# Patient Record
Sex: Male | Born: 1945
Health system: Southern US, Community
[De-identification: ages and names within clinical notes are randomized; demographics above are authoritative.]

## PROBLEM LIST (undated history)

## (undated) DIAGNOSIS — I2510 Atherosclerotic heart disease of native coronary artery without angina pectoris: Secondary | ICD-10-CM

## (undated) DIAGNOSIS — I10 Essential (primary) hypertension: Secondary | ICD-10-CM

## (undated) DIAGNOSIS — K219 Gastro-esophageal reflux disease without esophagitis: Secondary | ICD-10-CM

## (undated) DIAGNOSIS — E669 Obesity, unspecified: Secondary | ICD-10-CM

## (undated) DIAGNOSIS — R569 Unspecified convulsions: Secondary | ICD-10-CM

## (undated) DIAGNOSIS — M069 Rheumatoid arthritis, unspecified: Secondary | ICD-10-CM

## (undated) DIAGNOSIS — C8590 Non-Hodgkin lymphoma, unspecified, unspecified site: Secondary | ICD-10-CM

## (undated) DIAGNOSIS — I480 Paroxysmal atrial fibrillation: Secondary | ICD-10-CM

## (undated) DIAGNOSIS — IMO0002 Reserved for concepts with insufficient information to code with codable children: Secondary | ICD-10-CM

## (undated) DIAGNOSIS — I4510 Unspecified right bundle-branch block: Secondary | ICD-10-CM

## (undated) DIAGNOSIS — IMO0001 Reserved for inherently not codable concepts without codable children: Secondary | ICD-10-CM

## (undated) DIAGNOSIS — M1990 Unspecified osteoarthritis, unspecified site: Secondary | ICD-10-CM

## (undated) DIAGNOSIS — G250 Essential tremor: Secondary | ICD-10-CM

## (undated) DIAGNOSIS — G4730 Sleep apnea, unspecified: Secondary | ICD-10-CM

## (undated) DIAGNOSIS — D649 Anemia, unspecified: Secondary | ICD-10-CM

## (undated) DIAGNOSIS — H9190 Unspecified hearing loss, unspecified ear: Secondary | ICD-10-CM

## (undated) DIAGNOSIS — G4733 Obstructive sleep apnea (adult) (pediatric): Secondary | ICD-10-CM

## (undated) DIAGNOSIS — E119 Type 2 diabetes mellitus without complications: Secondary | ICD-10-CM

## (undated) DIAGNOSIS — N400 Enlarged prostate without lower urinary tract symptoms: Secondary | ICD-10-CM

## (undated) DIAGNOSIS — I639 Cerebral infarction, unspecified: Secondary | ICD-10-CM

## (undated) DIAGNOSIS — C801 Malignant (primary) neoplasm, unspecified: Secondary | ICD-10-CM

## (undated) DIAGNOSIS — Z531 Procedure and treatment not carried out because of patient's decision for reasons of belief and group pressure: Secondary | ICD-10-CM

## (undated) HISTORY — PX: EYE SURGERY: SHX253

## (undated) HISTORY — DX: Unspecified osteoarthritis, unspecified site: M19.90

## (undated) HISTORY — DX: Benign prostatic hyperplasia without lower urinary tract symptoms: N40.0

## (undated) HISTORY — PX: COLONOSCOPY: SHX174

## (undated) HISTORY — PX: VASECTOMY: SHX75

## (undated) HISTORY — DX: Type 2 diabetes mellitus without complications: E11.9

## (undated) HISTORY — PX: TONSILLECTOMY: SUR1361

## (undated) HISTORY — DX: Unspecified convulsions: R56.9

## (undated) HISTORY — DX: Obesity, unspecified: E66.9

## (undated) HISTORY — PX: WRIST GANGLION EXCISION: SHX840

## (undated) HISTORY — PX: PROSTATE BIOPSY: SHX241

## (undated) HISTORY — DX: Malignant (primary) neoplasm, unspecified: C80.1

## (undated) HISTORY — PX: SPINE SURGERY: SHX786

## (undated) HISTORY — DX: Rheumatoid arthritis, unspecified: M06.9

## (undated) HISTORY — DX: Essential (primary) hypertension: I10

## (undated) HISTORY — DX: Reserved for concepts with insufficient information to code with codable children: IMO0002

## (undated) HISTORY — DX: Obstructive sleep apnea (adult) (pediatric): G47.33

---

## 1999-03-03 ENCOUNTER — Emergency Department (HOSPITAL_COMMUNITY): Admission: EM | Admit: 1999-03-03 | Discharge: 1999-03-03 | Payer: Self-pay | Admitting: Emergency Medicine

## 1999-03-04 ENCOUNTER — Emergency Department (HOSPITAL_COMMUNITY): Admission: EM | Admit: 1999-03-04 | Discharge: 1999-03-05 | Payer: Self-pay | Admitting: Emergency Medicine

## 1999-08-31 ENCOUNTER — Emergency Department (HOSPITAL_COMMUNITY): Admission: EM | Admit: 1999-08-31 | Discharge: 1999-08-31 | Payer: Self-pay | Admitting: Emergency Medicine

## 2000-01-11 ENCOUNTER — Ambulatory Visit (HOSPITAL_COMMUNITY): Admission: RE | Admit: 2000-01-11 | Discharge: 2000-01-11 | Payer: Self-pay | Admitting: *Deleted

## 2000-03-02 ENCOUNTER — Emergency Department (HOSPITAL_COMMUNITY): Admission: EM | Admit: 2000-03-02 | Discharge: 2000-03-02 | Payer: Self-pay | Admitting: *Deleted

## 2003-11-11 ENCOUNTER — Encounter: Admission: RE | Admit: 2003-11-11 | Discharge: 2003-11-11 | Payer: Self-pay | Admitting: Emergency Medicine

## 2004-07-25 ENCOUNTER — Encounter: Admission: RE | Admit: 2004-07-25 | Discharge: 2004-07-25 | Payer: Self-pay | Admitting: Emergency Medicine

## 2005-11-22 ENCOUNTER — Ambulatory Visit: Payer: Self-pay | Admitting: Gastroenterology

## 2005-12-06 ENCOUNTER — Ambulatory Visit: Payer: Self-pay | Admitting: Gastroenterology

## 2007-12-04 ENCOUNTER — Encounter: Admission: RE | Admit: 2007-12-04 | Discharge: 2007-12-04 | Payer: Self-pay | Admitting: Emergency Medicine

## 2007-12-08 ENCOUNTER — Encounter: Admission: RE | Admit: 2007-12-08 | Discharge: 2007-12-08 | Payer: Self-pay | Admitting: Emergency Medicine

## 2010-12-03 ENCOUNTER — Encounter: Payer: Self-pay | Admitting: Emergency Medicine

## 2011-02-14 ENCOUNTER — Emergency Department (HOSPITAL_COMMUNITY): Payer: Medicare Other

## 2011-02-14 ENCOUNTER — Emergency Department (HOSPITAL_COMMUNITY)
Admission: EM | Admit: 2011-02-14 | Discharge: 2011-02-14 | Disposition: A | Discharge status: Home / Self Care | Payer: Medicare Other | Attending: Emergency Medicine | Admitting: Emergency Medicine

## 2011-02-14 DIAGNOSIS — R0789 Other chest pain: Secondary | ICD-10-CM | POA: Insufficient documentation

## 2011-02-14 DIAGNOSIS — R112 Nausea with vomiting, unspecified: Secondary | ICD-10-CM | POA: Insufficient documentation

## 2011-02-14 DIAGNOSIS — R262 Difficulty in walking, not elsewhere classified: Secondary | ICD-10-CM | POA: Insufficient documentation

## 2011-02-14 DIAGNOSIS — R0602 Shortness of breath: Secondary | ICD-10-CM | POA: Insufficient documentation

## 2011-02-14 DIAGNOSIS — H81399 Other peripheral vertigo, unspecified ear: Secondary | ICD-10-CM | POA: Insufficient documentation

## 2011-02-14 DIAGNOSIS — I10 Essential (primary) hypertension: Secondary | ICD-10-CM | POA: Insufficient documentation

## 2011-02-14 LAB — CBC
HCT: 44.8 % (ref 39.0–52.0)
Hemoglobin: 14.8 g/dL (ref 13.0–17.0)
MCH: 30.1 pg (ref 26.0–34.0)
MCHC: 33 g/dL (ref 30.0–36.0)
MCV: 91.2 fL (ref 78.0–100.0)
Platelets: 214 10*3/uL (ref 150–400)
RBC: 4.91 MIL/uL (ref 4.22–5.81)
RDW: 13.5 % (ref 11.5–15.5)
WBC: 7.1 10*3/uL (ref 4.0–10.5)

## 2011-02-14 LAB — BASIC METABOLIC PANEL
BUN: 13 mg/dL (ref 6–23)
CO2: 27 mEq/L (ref 19–32)
Calcium: 9.1 mg/dL (ref 8.4–10.5)
Chloride: 99 mEq/L (ref 96–112)
Creatinine, Ser: 0.98 mg/dL (ref 0.4–1.5)
GFR calc Af Amer: >60 mL/min (ref >60)
GFR calc non Af Amer: >60 mL/min (ref >60)
Glucose, Bld: 110 mg/dL — ABNORMAL HIGH (ref 70–99)
Potassium: 3.9 mEq/L (ref 3.5–5.1)
Sodium: 134 mEq/L — ABNORMAL LOW (ref 135–145)

## 2011-02-14 LAB — DIFFERENTIAL
Basophils Absolute: 0 10*3/uL (ref 0.0–0.1)
Basophils Relative: 0 % (ref 0–1)
Eosinophils Absolute: 0 10*3/uL (ref 0.0–0.7)
Eosinophils Relative: 1 % (ref 0–5)
Lymphocytes Relative: 13 % (ref 12–46)
Lymphs Abs: 0.9 10*3/uL (ref 0.7–4.0)
Monocytes Absolute: 0.3 10*3/uL (ref 0.1–1.0)
Monocytes Relative: 4 % (ref 3–12)
Neutro Abs: 5.9 10*3/uL (ref 1.7–7.7)
Neutrophils Relative %: 83 % — ABNORMAL HIGH (ref 43–77)

## 2011-02-14 LAB — POCT CARDIAC MARKERS
CKMB, poc: 2 ng/mL (ref 1.0–8.0)
Myoglobin, poc: 168 ng/mL (ref 12–200)
Troponin i, poc: <0.05 ng/mL (ref 0.00–0.09)

## 2011-10-22 ENCOUNTER — Ambulatory Visit (INDEPENDENT_AMBULATORY_CARE_PROVIDER_SITE_OTHER): Payer: Medicare Other

## 2011-10-22 DIAGNOSIS — J111 Influenza due to unidentified influenza virus with other respiratory manifestations: Secondary | ICD-10-CM

## 2011-10-22 DIAGNOSIS — J9801 Acute bronchospasm: Secondary | ICD-10-CM

## 2011-10-24 ENCOUNTER — Ambulatory Visit (INDEPENDENT_AMBULATORY_CARE_PROVIDER_SITE_OTHER): Payer: Medicare Other

## 2011-10-24 DIAGNOSIS — I7300 Raynaud's syndrome without gangrene: Secondary | ICD-10-CM

## 2011-10-24 DIAGNOSIS — J111 Influenza due to unidentified influenza virus with other respiratory manifestations: Secondary | ICD-10-CM

## 2011-10-24 DIAGNOSIS — R509 Fever, unspecified: Secondary | ICD-10-CM

## 2011-12-13 ENCOUNTER — Other Ambulatory Visit: Payer: Self-pay | Admitting: Family Medicine

## 2011-12-15 ENCOUNTER — Ambulatory Visit (INDEPENDENT_AMBULATORY_CARE_PROVIDER_SITE_OTHER): Payer: Medicare Other | Admitting: Family Medicine

## 2011-12-15 VITALS — BP 149/83 | HR 85 | Temp 98.1°F | Resp 16 | Ht 64.25 in | Wt 233.2 lb

## 2011-12-15 DIAGNOSIS — G252 Other specified forms of tremor: Secondary | ICD-10-CM

## 2011-12-15 DIAGNOSIS — J069 Acute upper respiratory infection, unspecified: Secondary | ICD-10-CM

## 2011-12-15 DIAGNOSIS — J45909 Unspecified asthma, uncomplicated: Secondary | ICD-10-CM | POA: Insufficient documentation

## 2011-12-15 DIAGNOSIS — I10 Essential (primary) hypertension: Secondary | ICD-10-CM

## 2011-12-15 DIAGNOSIS — N400 Enlarged prostate without lower urinary tract symptoms: Secondary | ICD-10-CM

## 2011-12-15 DIAGNOSIS — G250 Essential tremor: Secondary | ICD-10-CM

## 2011-12-15 MED ORDER — PREDNISONE 20 MG PO TABS: ORAL_TABLET | ORAL | Status: DC

## 2011-12-15 MED ORDER — DOXYCYCLINE HYCLATE 100 MG PO TABS: 100.0000 mg | ORAL_TABLET | Freq: Two times a day (BID) | ORAL | Status: AC

## 2011-12-15 NOTE — Progress Notes (Signed)
  Subjective:    Patient ID: Bryan [name], male    DOB: 1946/05/13, 66 y.o.   MRN: [id number]  HPI Patient was here back in December and was referred to a rheumatologist because of his Raynaud's problems. However due to a glitch of not getting a phone message he never got the appointment. He continues to have issues there and will still need to see a rheumatologist.  In December patient was treated for a respiratory tract infection. He was given doxycycline, and had 4 or 5 pills left over. He has had a cold and congestion all week, and tried taking the remaining [place]. He thought he was trying to do better but ran out of the medication. He called in here to get a refill but never got a return phone call. He has persisted in having a headache. He is occasionally getting some mucus out of his nose. His chest remains congested. He coughs but does not regularly bring up anything. At times he brings up a little. Has had some fever.  He does have a primary inhaler at home but did not feel like it was helping a lot. 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 He does not smoke.   Review of Systems as above     Objective:   Physical Exam   Overweight male in no acute distress. Has hearing aids in both ears, but the TMs are normal. Throat clear. On chest exam he does have scattered wheezing throughout both lungs. Heart regular.     Assessment & Plan:  URI with asthmatic bronchitis.   Will have him to resume using his inhaler at home. We'll re\re prescribe the doxycycline since that was helping him. He is not diabetic and I'm going to give him a brief taper of prednisone to see if we can break the wheeze. No labs or x-rays were done at this time but will have him return if he is not doing better. At that time we would probably need to do additional labs and x-rays.  We will try to resubmit the previous rheumatologic referral. Since it was done on the paper chart,  I think I will just add a note to it and have them fax it back to the dermatologist office. He will give me the phone number that he wants to be contacted at

## 2011-12-15 NOTE — Patient Instructions (Addendum)
Patient was instructed to use the inhaler Rennis Golden) that he has at home. He is to take the doxycycline for the full 10 days inhaler should be used 2 puffs every 4-6 hours when awake. If he awakens in the night real congested he can use this use it then also. He is encouraged to drink lots of water.  We will send his referral on to the rheumatologist. He is to let us know if he does not hear from them

## 2011-12-18 ENCOUNTER — Other Ambulatory Visit: Payer: Self-pay | Admitting: *Deleted

## 2011-12-18 DIAGNOSIS — J45909 Unspecified asthma, uncomplicated: Secondary | ICD-10-CM

## 2012-01-29 ENCOUNTER — Ambulatory Visit (INDEPENDENT_AMBULATORY_CARE_PROVIDER_SITE_OTHER): Payer: Medicare Other | Admitting: Family Medicine

## 2012-01-29 VITALS — BP 148/88 | HR 80 | Temp 98.3°F | Resp 16 | Ht 64.25 in | Wt 229.0 lb

## 2012-01-29 DIAGNOSIS — M549 Dorsalgia, unspecified: Secondary | ICD-10-CM

## 2012-01-29 DIAGNOSIS — J410 Simple chronic bronchitis: Secondary | ICD-10-CM

## 2012-01-29 DIAGNOSIS — R350 Frequency of micturition: Secondary | ICD-10-CM

## 2012-01-29 DIAGNOSIS — I7300 Raynaud's syndrome without gangrene: Secondary | ICD-10-CM

## 2012-01-29 DIAGNOSIS — R062 Wheezing: Secondary | ICD-10-CM

## 2012-01-29 LAB — POCT UA - MICROSCOPIC ONLY
Casts, Ur, LPF, POC: NEGATIVE
Crystals, Ur, HPF, POC: NEGATIVE
Mucus, UA: NEGATIVE
Yeast, UA: NEGATIVE

## 2012-01-29 LAB — POCT URINALYSIS DIPSTICK
Bilirubin, UA: NEGATIVE
Glucose, UA: NEGATIVE
Ketones, UA: NEGATIVE
Leukocytes, UA: NEGATIVE
Nitrite, UA: NEGATIVE
Protein, UA: NEGATIVE
Spec Grav, UA: 1.015
Urobilinogen, UA: 0.2
pH, UA: 5

## 2012-01-29 MED ORDER — ALBUTEROL SULFATE (2.5 MG/3ML) 0.083% IN NEBU
2.5000 mg | INHALATION_SOLUTION | Freq: Once | RESPIRATORY_TRACT | Status: AC
  Administered 2012-01-29: 2.5 mg via RESPIRATORY_TRACT

## 2012-01-29 MED ORDER — DOXYCYCLINE HYCLATE 100 MG PO CAPS: 100.0000 mg | ORAL_CAPSULE | Freq: Two times a day (BID) | ORAL | Status: AC

## 2012-01-29 NOTE — Progress Notes (Signed)
Patient Name: Bryan [name] Date of Birth: Nov 15, 1945 Medical Record Number: [id number] Gender: male Date of Encounter: 01/29/2012  History of Present Illness:  Bryan [name] is a 66 y.o. very pleasant male patient who presents with the following:  He has Raynaud's phenomenen- was evaluated for this in December 2012 and noted to have an elevated sed rate and a positive ANA (1:320).  He was sent to one rheumatologist but did not feel that he was hearing his regarding his problem.  He would like to have a second opinion.  We did change his BP medication from HCTZ to HCTZ/ losartan but he does not note much of a difference.  His symptoms are better now that the weather is warmer.    He is here today due to a "gurgling noise in my chest" for the last 3 days or so.  He also notes some wheezing, this is worse at night. He does have an albuterol inhaler but has not been using it. His lower back hurts if he stands up.(he connects the back pain to "my prostate being enlarged.")  No cough, is not bringing up significant mucus.  No runny nose, stuffy nose or sneezing.  He has not noted a fever.  No ST, no earache.    He also states that he has been taking terbinafine for several months for a nail fungus per the [place].  He had labs checked about a month ago.   Bryan [name] does not note any acute change in his urinary habits, but he does have a weak stream and nocturia due to BPH.  These symptoms are worse than usual for the last couple of weeks, but no dysuria.    Patient Active Problem List  Diagnoses  . URI (upper respiratory infection)  . Asthmatic bronchitis  . BPH (benign prostatic hyperplasia)  . Essential tremor  . HTN (hypertension)   No past medical history on file. No past surgical history on file. History  Substance Use Topics  . Smoking status: Former Smoker    Types: Cigarettes    Quit date: 12/14/1972  . Smokeless tobacco: Not on file  . Alcohol Use: Not on file   No family history on  file. Allergies  Allergen Reactions  . Ampicillin Nausea Only    Medication list has been reviewed and updated.  Review of Systems: As per HPI- otherwise negative.  Physical Examination: Filed Vitals:   01/29/12 1100  BP: 148/88  Pulse: 80  Temp: 98.3 F (36.8 C)  TempSrc: Oral  Resp: 16  Height: 5' 4.25" (1.632 m)  Weight: 229 lb (103.874 kg)    Body mass index is 39.00 kg/(m^2).  GEN: WDWN, NAD, Non-toxic, A & O x 3, obese   HEENT: Atraumatic, Normocephalic. Neck supple. No masses, No LAD. TM and oropharynx wnl Ears and Nose: No external deformity. CV: RRR, No M/G/R. No JVD. No thrill. No extra heart sounds. PULM: CTA B, crackles, rhonchi. No retractions. No resp. distress. No accessory muscle use.  Expiratory wheezes ABD: S, NT, ND, +BS. No rebound. No HSM. EXTR: No c/c/e NEURO Normal gait.  PSYCH: Normally interactive. Conversant. Not depressed or anxious appearing.  Calm demeanor.  Back: he notes tenderness bilateral lumbar paraspinous muscles GU: prostate enlarged but nontender  Albuterol neb: Purvis felt a little more open, but was still wheezy.    Results for orders placed in visit on 01/29/12  POCT UA - MICROSCOPIC ONLY      Component Value Range   WBC,  Ur, HPF, POC 0-2     RBC, urine, microscopic 2-3     Bacteria, U Microscopic trace     Mucus, UA negative     Epithelial cells, urine per micros 1-2     Crystals, Ur, HPF, POC negative     Casts, Ur, LPF, POC negative     Yeast, UA negative    POCT URINALYSIS DIPSTICK      Component Value Range   Color, UA yellow     Clarity, UA clear     Glucose, UA negative     Bilirubin, UA negative     Ketones, UA negative     Spec Grav, UA 1.015     Blood, UA small     pH, UA 5.0     Protein, UA negative     Urobilinogen, UA 0.2     Nitrite, UA negative     Leukocytes, UA Negative      Assessment and Plan: 1. Wheezing  albuterol (PROVENTIL) (2.5 MG/3ML) 0.083% nebulizer solution 2.5 mg, doxycycline  (VIBRAMYCIN) 100 MG capsule  2. Back pain    3. Urinary frequency  POCT UA - Microscopic Only, POCT urinalysis dipstick, Urine culture  4. Raynaud's phenomenon  Ambulatory referral to Rheumatology    Will refer for a second opinion per Dr. Corliss Skains.  Deep can use his albuterol, and doxycycline for asmatic bronchitis.

## 2012-01-31 ENCOUNTER — Encounter: Payer: Self-pay | Admitting: Family Medicine

## 2012-01-31 LAB — URINE CULTURE
Colony Count: NO GROWTH
Organism ID, Bacteria: NO GROWTH

## 2012-03-16 ENCOUNTER — Ambulatory Visit (INDEPENDENT_AMBULATORY_CARE_PROVIDER_SITE_OTHER): Payer: Medicare Other | Admitting: Emergency Medicine

## 2012-03-16 ENCOUNTER — Ambulatory Visit: Payer: Medicare Other

## 2012-03-16 VITALS — BP 167/98 | HR 78 | Temp 98.1°F | Resp 16 | Ht 64.25 in | Wt 229.0 lb

## 2012-03-16 DIAGNOSIS — M25559 Pain in unspecified hip: Secondary | ICD-10-CM

## 2012-03-16 DIAGNOSIS — IMO0002 Reserved for concepts with insufficient information to code with codable children: Secondary | ICD-10-CM

## 2012-03-16 DIAGNOSIS — M549 Dorsalgia, unspecified: Secondary | ICD-10-CM

## 2012-03-16 DIAGNOSIS — M5410 Radiculopathy, site unspecified: Secondary | ICD-10-CM

## 2012-03-16 LAB — POCT URINALYSIS DIPSTICK
Bilirubin, UA: NEGATIVE
Glucose, UA: NEGATIVE
Ketones, UA: NEGATIVE
Leukocytes, UA: NEGATIVE
Nitrite, UA: NEGATIVE
Protein, UA: NEGATIVE
Spec Grav, UA: 1.02
Urobilinogen, UA: 0.2
pH, UA: 6

## 2012-03-16 LAB — POCT UA - MICROSCOPIC ONLY
Casts, Ur, LPF, POC: NEGATIVE
Crystals, Ur, HPF, POC: NEGATIVE
Mucus, UA: NEGATIVE
Yeast, UA: NEGATIVE

## 2012-03-16 MED ORDER — HYDROCODONE-ACETAMINOPHEN 5-325 MG PO TABS: 1.0000 | ORAL_TABLET | Freq: Four times a day (QID) | ORAL | Status: AC | PRN

## 2012-03-16 MED ORDER — PREDNISONE 20 MG PO TABS: ORAL_TABLET | ORAL | Status: DC

## 2012-03-16 NOTE — Patient Instructions (Signed)
[facility] and [facility] they can see you in followup.

## 2012-03-16 NOTE — Progress Notes (Signed)
  Subjective:    Patient ID: Bryan [name], male    DOB: 1946-04-25, 66 y.o.   MRN: [id number]  HPI patient enters with onset yesterday of severe pain in his left hip. Patient drove to [facility]. He has no history of back problems. He has pain on the left lower back with radicular symptoms down his left leg to the left ankle. He denies any weakness in the left leg. He is unable to get into a comfortable position.    Review of Systems     Objective:   Physical Exam  Constitutional: He appears well-developed and well-nourished.  Abdominal: Soft. He exhibits no distension. There is no tenderness. There is no rebound.  Musculoskeletal:       Patient unable to get into a comfortable position. He is tender her left L5-S1 area. Straight leg raising to 90 actually gives him some relief from his pain. Deep tendon reflexes knees 2+ ankles absent. There is no definite weakness elicited of the left leg.    [facility] reading (PRIMARY) by  DrDaub patient has severe degenerative disc disease at L3-L4 and L5-S1 with significant loss of disc space height. Hip films look normal.  Results for orders placed in visit on 03/16/12  POCT UA - MICROSCOPIC ONLY      Component Value Range   WBC, Ur, HPF, POC 0-1     RBC, urine, microscopic 2-3     Bacteria, U Microscopic trace     Mucus, UA neg     Epithelial cells, urine per micros 1-2     Crystals, Ur, HPF, POC neg     Casts, Ur, LPF, POC neg     Yeast, UA neg    POCT URINALYSIS DIPSTICK      Component Value Range   Color, UA yellow     Clarity, UA clear     Glucose, UA neg     Bilirubin, UA neg     Ketones, UA neg     Spec Grav, UA 1.020     Blood, UA trace     pH, UA 6.0     Protein, UA neg     Urobilinogen, UA 0.2     Nitrite, UA neg     Leukocytes, UA Negative          Assessment & Plan:    Patient presents with low back pain radicular symptoms down the left leg. He also has some difficulty with urinary frequency. He does  have a history of urinary problems and is on Flomax for this.

## 2012-05-01 ENCOUNTER — Ambulatory Visit (INDEPENDENT_AMBULATORY_CARE_PROVIDER_SITE_OTHER): Payer: Medicare Other | Admitting: Emergency Medicine

## 2012-05-01 VITALS — BP 145/82 | HR 76 | Temp 98.6°F | Resp 16 | Ht 64.0 in | Wt 227.0 lb

## 2012-05-01 DIAGNOSIS — IMO0001 Reserved for inherently not codable concepts without codable children: Secondary | ICD-10-CM

## 2012-05-01 DIAGNOSIS — R350 Frequency of micturition: Secondary | ICD-10-CM

## 2012-05-01 DIAGNOSIS — N4232 Atypical small acinar proliferation of prostate: Secondary | ICD-10-CM

## 2012-05-01 DIAGNOSIS — D400 Neoplasm of uncertain behavior of prostate: Secondary | ICD-10-CM

## 2012-05-01 DIAGNOSIS — K921 Melena: Secondary | ICD-10-CM

## 2012-05-01 DIAGNOSIS — N400 Enlarged prostate without lower urinary tract symptoms: Secondary | ICD-10-CM

## 2012-05-01 LAB — COMPREHENSIVE METABOLIC PANEL
AST: 19 U/L (ref 0–37)
Albumin: 4.1 g/dL (ref 3.5–5.2)
Alkaline Phosphatase: 120 U/L — ABNORMAL HIGH (ref 39–117)
BUN: 22 mg/dL (ref 6–23)
Potassium: 4.2 mEq/L (ref 3.5–5.3)
Sodium: 138 mEq/L (ref 135–145)
Total Bilirubin: 0.3 mg/dL (ref 0.3–1.2)

## 2012-05-01 LAB — POCT CBC
Granulocyte percent: 60.1 %G (ref 37–80)
HCT, POC: 44.5 % (ref 43.5–53.7)
Hemoglobin: 13.7 g/dL — AB (ref 14.1–18.1)
MPV: 8 fL (ref 0–99.8)
POC Granulocyte: 3.3 (ref 2–6.9)
RDW, POC: 15 %

## 2012-05-01 LAB — POCT UA - MICROSCOPIC ONLY
Bacteria, U Microscopic: NEGATIVE
Casts, Ur, LPF, POC: NEGATIVE
Crystals, Ur, HPF, POC: NEGATIVE
Epithelial cells, urine per micros: NEGATIVE
Mucus, UA: POSITIVE
Yeast, UA: NEGATIVE

## 2012-05-01 LAB — POCT URINALYSIS DIPSTICK
Nitrite, UA: NEGATIVE
Urobilinogen, UA: 0.2
pH, UA: 6

## 2012-05-01 LAB — GLUCOSE, POCT (MANUAL RESULT ENTRY): POC Glucose: 63 mg/dl — AB (ref 70–99)

## 2012-05-01 LAB — IFOBT (OCCULT BLOOD): IFOBT: POSITIVE

## 2012-05-01 MED ORDER — DOXYCYCLINE HYCLATE 100 MG PO TABS: 100.0000 mg | ORAL_TABLET | Freq: Two times a day (BID) | ORAL | Status: AC

## 2012-05-01 NOTE — Progress Notes (Signed)
  Subjective:    Patient ID: Bryan [name], male    DOB: 06-29-1946, 66 y.o.   MRN: [id number]  HPI patient states for the last 2-3 nights she has noticed a he has been urinating very frequently through the day. He also gets up to 3 times at night. He states he can't remember when he last had a prostate check he does not have any burning stinging or pain on urination. He does not feel ill    Review of Systems     Objective:   Physical Exam is physical reveals an alert gentleman who is not in any distress. His TMs are clear. He has bilateral hearing aids. His neck is supple. His chest is clear to auscultation his abdomen is soft and nontender. Genital exam is normal without discharge and no hernias rectal exam reveals a relatively small prostate without nodules the stool was sent for hemosure   Results for orders placed in visit on 05/01/12  POCT UA - MICROSCOPIC ONLY      Component Value Range   WBC, Ur, HPF, POC 0-1     RBC, urine, microscopic 0-1     Bacteria, U Microscopic neg     Mucus, UA positive     Epithelial cells, urine per micros neg     Crystals, Ur, HPF, POC neg     Casts, Ur, LPF, POC neg     Yeast, UA neg    POCT URINALYSIS DIPSTICK      Component Value Range   Color, UA yellow     Clarity, UA clear     Glucose, UA neg     Bilirubin, UA neg     Ketones, UA neg     Spec Grav, UA 1.020     Blood, UA trace-lysed     pH, UA 6.0     Protein, UA neg     Urobilinogen, UA 0.2     Nitrite, UA neg     Leukocytes, UA Negative    POCT CBC      Component Value Range   WBC 5.5  4.6 - 10.2 K/uL   Lymph, poc 1.8  0.6 - 3.4   POC LYMPH PERCENT 32.2  10 - 50 %L   MID (cbc) 0.4  0 - 0.9   POC MID % 7.7  0 - 12 %M   POC Granulocyte 3.3  2 - 6.9   Granulocyte percent 60.1  37 - 80 %G   RBC 4.75  4.69 - 6.13 M/uL   Hemoglobin 13.7 (*) 14.1 - 18.1 g/dL   HCT, POC 78.2  95.6 - 53.7 %   MCV 93.6  80 - 97 fL   MCH, POC 28.8  27 - 31.2 pg   MCHC 30.8 (*) 31.8 - 35.4 g/dL   RDW, POC 21.3     Platelet Count, POC 251  142 - 424 K/uL   MPV 8.0  0 - 99.8 fL  IFOBT (OCCULT BLOOD)      Component Value Range   IFOBT Positive    GLUCOSE, POCT (MANUAL RESULT ENTRY)      Component Value Range   POC Glucose 63 (*) 70 - 99 mg/dl       Assessment & Plan:

## 2012-05-19 ENCOUNTER — Encounter: Payer: Self-pay | Admitting: Family Medicine

## 2012-05-25 ENCOUNTER — Other Ambulatory Visit: Payer: Self-pay | Admitting: Family Medicine

## 2012-05-26 ENCOUNTER — Telehealth: Payer: Self-pay

## 2012-05-26 NOTE — Telephone Encounter (Signed)
Pt is needing a refill on losartan and the pharmacy states that his rx is out date   Please call [phone]

## 2012-05-27 ENCOUNTER — Telehealth: Payer: Self-pay | Admitting: Family Medicine

## 2012-05-27 MED ORDER — LOSARTAN POTASSIUM-HCTZ 50-12.5 MG PO TABS: 1.0000 | ORAL_TABLET | Freq: Every day | ORAL | Status: DC

## 2012-05-27 NOTE — Telephone Encounter (Signed)
No answer and no way to leave a message

## 2012-05-27 NOTE — Telephone Encounter (Signed)
rx sent into pharmacy. Needs ov

## 2012-06-04 ENCOUNTER — Ambulatory Visit (INDEPENDENT_AMBULATORY_CARE_PROVIDER_SITE_OTHER): Payer: Medicare Other | Admitting: Family Medicine

## 2012-06-04 ENCOUNTER — Ambulatory Visit: Payer: Medicare Other

## 2012-06-04 ENCOUNTER — Encounter: Payer: Self-pay | Admitting: Family Medicine

## 2012-06-04 VITALS — BP 145/75 | HR 73 | Temp 97.8°F | Resp 16 | Ht 64.5 in | Wt 224.0 lb

## 2012-06-04 DIAGNOSIS — R195 Other fecal abnormalities: Secondary | ICD-10-CM

## 2012-06-04 DIAGNOSIS — M25541 Pain in joints of right hand: Secondary | ICD-10-CM

## 2012-06-04 DIAGNOSIS — Z0000 Encounter for general adult medical examination without abnormal findings: Secondary | ICD-10-CM

## 2012-06-04 DIAGNOSIS — I10 Essential (primary) hypertension: Secondary | ICD-10-CM

## 2012-06-04 DIAGNOSIS — N429 Disorder of prostate, unspecified: Secondary | ICD-10-CM

## 2012-06-04 DIAGNOSIS — M2550 Pain in unspecified joint: Secondary | ICD-10-CM

## 2012-06-04 DIAGNOSIS — M25549 Pain in joints of unspecified hand: Secondary | ICD-10-CM

## 2012-06-04 LAB — POCT URINALYSIS DIPSTICK
Glucose, UA: NEGATIVE
Nitrite, UA: NEGATIVE
Urobilinogen, UA: 0.2

## 2012-06-04 LAB — URIC ACID: Uric Acid, Serum: 8.2 mg/dL — ABNORMAL HIGH (ref 4.0–7.8)

## 2012-06-04 NOTE — Patient Instructions (Addendum)
Keeping you healthy  Get these tests  Blood pressure- Have your blood pressure checked once a year by your healthcare provider.  Normal blood pressure is 120/80  Weight- Have your body mass index (BMI) calculated to screen for obesity.  BMI is a measure of body fat based on height and weight. You can also calculate your own BMI at [email or web address].  Cholesterol- Have your cholesterol checked every year.  Diabetes- Have your blood sugar checked regularly if you have high blood pressure, high cholesterol, have a family history of diabetes or if you are overweight.  Screening for Colon Cancer- Colonoscopy starting at age 32.  Screening may begin sooner depending on your family history and other health conditions. Follow up colonoscopy as directed by your Gastroenterologist.  Screening for Prostate Cancer- Both blood work (PSA) and a rectal exam help screen for Prostate Cancer.  Screening begins at age 65 with African-American men and at age 61 with Caucasian men.  Screening may begin sooner depending on your family history.  Take these medicines  Aspirin- One aspirin daily can help prevent Heart disease and Stroke.  Flu shot- Every fall.  Tetanus- Every 10 years.  Zostavax- Once after the age of 30 to prevent Shingles.  Pneumonia shot- Once after the age of 9; if you are younger than 24, ask your healthcare provider if you need a Pneumonia shot.  Take these steps  Don't smoke- If you do smoke, talk to your doctor about quitting.  For tips on how to quit, go to www.smokefree.gov or call 1-800-QUIT-NOW.  Be physically active- Exercise 5 days a week for at least 30 minutes.  If you are not already physically active start slow and gradually work up to 30 minutes of moderate physical activity.  Examples of moderate activity include walking briskly, mowing the yard, dancing, swimming, bicycling, etc.  Eat a healthy diet- Eat a variety of healthy food such as fruits, vegetables, low  fat milk, low fat cheese, yogurt, lean meant, poultry, fish, beans, tofu, etc. For more information go to www.thenutritionsource.org  Drink alcohol in moderation- Limit alcohol intake to less than two drinks a day. Never drink and drive.  Dentist- Brush and floss twice daily; visit your dentist twice a year.  Depression- Your emotional health is as important as your physical health. If you're feeling down, or losing interest in things you would normally enjoy please talk to your healthcare provider.  Eye exam- Visit your eye doctor every year.  Safe sex- If you may be exposed to a sexually transmitted infection, use a condom.  Seat belts- Seat belts can save your life; always wear one.  Smoke/Carbon Monoxide detectors- These detectors need to be installed on the appropriate level of your home.  Replace batteries at least once a year.  Skin cancer- When out in the sun, cover up and use sunscreen 15 SPF or higher.  Violence- If anyone is threatening you, please tell your healthcare provider.  Living Will/ Health care power of attorney- Speak with your healthcare provider and family.   You have been referred to a Gastroenterologist (GI specialist) to evaluate the positive stool test (blood) back in June. Your appointment:  July 09, 2012    Wednesday at   8:30 AM (be at your appt by 8:15 AM). You will see Dr. Arlyce Dice    Ph # is [phone].  Your hand xray show arthritis. Try using a topical pain ointment like Ben-Gay or Tiger Balm or Flexall. Apply this 3-4 times a  day.

## 2012-06-04 NOTE — Progress Notes (Signed)
Subjective:    Patient ID: Bryan [name], male    DOB: 03-Nov-1946, 66 y.o.   MRN: [id number]  HPI   This 66 y.o. AA male who receives Primary care at the [facility] facility in [place], [place]. His chronic  medications are prescribed by physicians at [facility]  He was seen here in June by Dr. Cleta Alberts and had + Hemosure; referral to GI needs to be re-ordered.  C/o R hand pain for > 1 year. Injuried hand when trying to crank a tiller. Now has a lot of swelling and  pain in hand. Wakes up with pain.  Has joint pain inmultiple joints and had knee injection at [facility] recently.   Also c/o of feeling off balance every day. Thinks it is his medication, especially the Primidone which  he adjusts downward to from 60 mg to 45 mg ( he has not discussed this with the prescribing M.D.  at the [facility] facility in [place]).   He is married and works as a Midwife. He does not smoke but drinks alcohol occasionally.    Review of Systems  Constitutional: Negative.   HENT: Positive for hearing loss and neck stiffness. Negative for neck pain.        Wears hearing aids  Eyes:       Wears glasses  Respiratory: Negative for cough, chest tightness and shortness of breath.   Cardiovascular: Negative for chest pain and palpitations.  Musculoskeletal: Positive for joint swelling and arthralgias. Negative for myalgias and back pain.  Neurological: Positive for dizziness. Negative for syncope, weakness, numbness and headaches.  Psychiatric/Behavioral:       Some memory issues       Objective:   Physical Exam  Constitutional: He is oriented to person, place, and time. He appears well-developed and well-nourished. No distress.  HENT:  Head: Normocephalic and atraumatic.  Nose: Nose normal.  Mouth/Throat: Oropharynx is clear and moist.       Hearing aids in both ears  Eyes: Conjunctivae and EOM are normal. No scleral icterus.  Neck: Normal range of motion. No thyromegaly present.  Cardiovascular: Normal rate and normal  heart sounds.  Exam reveals no gallop.   No murmur heard.      Occasional compensatory pause  Pulmonary/Chest: Effort normal and breath sounds normal. No respiratory distress. He has no wheezes.  Genitourinary:       Deferred- pt sees Urologist and had DRE done in June   Musculoskeletal:       Right hand: swollen across MCP joints esp. At 3rd and 4th; squeeze test + on R/ neg on L Grip is fair in right hand; normal grip on left  Lymphadenopathy:    He has no cervical adenopathy.  Neurological: He is alert and oriented to person, place, and time. No cranial nerve deficit. Coordination normal.  Skin: Skin is warm and dry. No erythema.  Psychiatric: He has a normal mood and affect. His behavior is normal. Thought content normal.     [facility] reading (PRIMARY) by  Dr. Audria Nine: Degenerative changes in wrist and MCP and PIP joints;  No fracture or dislocation .       Assessment & Plan:   1. Pain in joint of right hand  DG Hand 2 View Right- Degenerative changes in joints  2. Multiple joint pain  Uric acid, Vitamin D level, Rheumatoid factor, Sed rate  3. Occult blood in stools  Referal to GI ordered (by phone)  4. HTN (hypertension)  Continue current medications and follow-up at [facility] as scheduled  5. Prostate disease  POCT urinalysis dipstick

## 2012-06-04 NOTE — Progress Notes (Signed)
Quick Note:  Let pt know that the final reading on his hand xray agrees with what he was told during his visit. He will be notified about his lab results within the next week.  He can make an appointment to come back to discuss treatment of arthritis or he can discuss with his [place] physician when he goes back to the [place] facility in [place]. ______

## 2012-06-05 ENCOUNTER — Encounter: Payer: Self-pay | Admitting: Family Medicine

## 2012-06-05 DIAGNOSIS — Z87891 Personal history of nicotine dependence: Secondary | ICD-10-CM | POA: Insufficient documentation

## 2012-06-05 DIAGNOSIS — M2550 Pain in unspecified joint: Secondary | ICD-10-CM | POA: Insufficient documentation

## 2012-06-05 LAB — VITAMIN D 25 HYDROXY (VIT D DEFICIENCY, FRACTURES): Vit D, 25-Hydroxy: 35 ng/mL (ref 30–89)

## 2012-06-06 NOTE — Progress Notes (Signed)
Quick Note:  Please call pt and advise that the following labs are abnormal... Some of your blood test related to your bones and joints are abnormal. Schedule a follow-up appointment so we can review these results and discuss treatment.  Copy to pt.   If he chooses, he can take these results with him to the [place] facility in [place], [place]. and discuss them with his provider there.  I would like to be able to review them with him in person. ______

## 2012-06-27 ENCOUNTER — Telehealth: Payer: Self-pay | Admitting: Radiology

## 2012-06-27 NOTE — Telephone Encounter (Signed)
Message copied by Caffie Damme on Fri Jun 27, 2012 10:54 AM ------      Message from: Abbe Amsterdam C      Created: Thu Jun 19, 2012  5:29 PM       I received requests for multiple pieces of DME- a lift chair, walker, various braces, etc.  He will need a face to face evaluation to determine if he qualifies for these devices.  I will also need notes from his other doctors- the [place], rheumatology.  If he can get these notes sent to [place] and then come in we can talk about these devices Please give him a call            Thanks!

## 2012-06-27 NOTE — Telephone Encounter (Signed)
Patient has been advised, he will come in for this, he did not want to schedule an appt.

## 2012-07-01 ENCOUNTER — Encounter: Payer: Medicare Other | Admitting: Emergency Medicine

## 2012-07-08 ENCOUNTER — Encounter: Payer: Self-pay | Admitting: Gastroenterology

## 2012-07-09 ENCOUNTER — Ambulatory Visit: Payer: Self-pay | Admitting: Gastroenterology

## 2012-07-23 ENCOUNTER — Other Ambulatory Visit: Payer: Self-pay | Admitting: Family Medicine

## 2012-07-25 ENCOUNTER — Ambulatory Visit (INDEPENDENT_AMBULATORY_CARE_PROVIDER_SITE_OTHER): Payer: Medicare Other | Admitting: Family Medicine

## 2012-07-25 VITALS — BP 144/82 | HR 82 | Temp 98.2°F | Resp 16 | Ht 65.0 in | Wt 230.0 lb

## 2012-07-25 DIAGNOSIS — R05 Cough: Secondary | ICD-10-CM

## 2012-07-25 DIAGNOSIS — R059 Cough, unspecified: Secondary | ICD-10-CM

## 2012-07-25 DIAGNOSIS — J329 Chronic sinusitis, unspecified: Secondary | ICD-10-CM

## 2012-07-25 MED ORDER — CEFDINIR 300 MG PO CAPS: 300.0000 mg | ORAL_CAPSULE | Freq: Two times a day (BID) | ORAL | Status: AC

## 2012-07-25 MED ORDER — HYDROCODONE-HOMATROPINE 5-1.5 MG/5ML PO SYRP: 5.0000 mL | ORAL_SOLUTION | ORAL | Status: AC | PRN

## 2012-07-25 NOTE — Progress Notes (Signed)
Subjective: The past 5 days she's had a upper sparked or infection. Started his nose, with sneezing, been coughing. He is felt bad. He needs to go back to work on Monday. He had fever one day. He had some hydrocodone cough syrup which helped, but he ran out of it.  Objective: Wears hearing aid is. Throat clear but has postnasal drainage. Neck supple without significant nodes. Chest clear. Heart regular without murmurs.  Assessment: Sinusitis/bronchitis  Plan: Hydrocodone cough syrup and Omnicef. Return if worse.

## 2012-07-25 NOTE — Patient Instructions (Addendum)
Drink lots of fluids Return if worse

## 2012-07-28 ENCOUNTER — Encounter: Payer: Self-pay | Admitting: Gastroenterology

## 2012-07-28 ENCOUNTER — Ambulatory Visit (INDEPENDENT_AMBULATORY_CARE_PROVIDER_SITE_OTHER): Payer: Medicare Other | Admitting: Gastroenterology

## 2012-07-28 VITALS — BP 158/80 | HR 68 | Ht 64.0 in | Wt 230.0 lb

## 2012-07-28 DIAGNOSIS — R195 Other fecal abnormalities: Secondary | ICD-10-CM

## 2012-07-28 MED ORDER — NA SULFATE-K SULFATE-MG SULF 17.5-3.13-1.6 GM/177ML PO SOLN: 1.0000 | Freq: Once | ORAL | Status: DC

## 2012-07-28 NOTE — Progress Notes (Signed)
History of Present Illness: Pleasant 66 year old Afro-American male referred at the request of Dr. Patsy Lager for evaluation of Hemoccult-positive stool. This was noted on routine testing. He has no GI complaints including change of bowel habits, abdominal pain, melena or hematochezia. He takes Mobic daily for arthritic pain. Hemoglobin in June, 2013 was normal.    Past Medical History  Diagnosis Date  . Arthritis   . Obesity   . HTN (hypertension)   . BPH (benign prostatic hypertrophy)    Past Surgical History  Procedure Date  . Tonsillectomy   . Eye surgery     right, growth excision   family history includes Colon cancer in his maternal grandfather; Diabetes in his paternal aunt; Kidney disease in his brother; and Stroke in his maternal grandmother. Current Outpatient Prescriptions  Medication Sig Dispense Refill  . cefdinir (OMNICEF) 300 MG capsule Take 1 capsule (300 mg total) by mouth 2 (two) times daily.  20 capsule  0  . HYDROcodone-homatropine (HYCODAN) 5-1.5 MG/5ML syrup Take 5 mLs by mouth every 4 (four) hours as needed for cough.  120 mL  0  . losartan-hydrochlorothiazide (HYZAAR) 50-12.5 MG per tablet Take 1 tablet by mouth daily.  30 tablet  0  . meloxicam (MOBIC) 15 MG tablet Take 15 mg by mouth daily.      . primidone (MYSOLINE) 50 MG tablet Take 50 mg by mouth at bedtime. Takes 2 po qhs      . Tamsulosin HCl (FLOMAX) 0.4 MG CAPS Take 0.4 mg by mouth daily.       Allergies as of 07/28/2012 - Review Complete 07/28/2012  Allergen Reaction Noted  . Ampicillin Nausea Only 12/15/2011    reports that he quit smoking about 39 years ago. His smoking use included Cigarettes. He has never used smokeless tobacco. He reports that he drinks alcohol. He reports that he does not use illicit drugs.     Review of Systems: He complains of joint pains especially in his knee Pertinent positive and negative review of systems were noted in the above HPI section. All other review of systems  were otherwise negative.  Vital signs were reviewed in today's medical record Physical Exam: General: Well developed , well nourished, no acute distress Head: Normocephalic and atraumatic Eyes:  sclerae anicteric, EOMI Ears: Normal auditory acuity Mouth: No deformity or lesions Neck: Supple, no masses or thyromegaly Lungs: Clear throughout to auscultation Heart: Regular rate and rhythm; no murmurs, rubs or bruits Abdomen: Soft, non tender and non distended. No masses, hepatosplenomegaly or hernias noted. Normal Bowel sounds Rectal:deferred Musculoskeletal: Symmetrical with no gross deformities  Skin: No lesions on visible extremities Pulses:  Normal pulses noted Extremities: No clubbing, cyanosis, edema or deformities noted Neurological: Alert oriented x 4, grossly nonfocal Cervical Nodes:  No significant cervical adenopathy Inguinal Nodes: No significant inguinal adenopathy Psychological:  Alert and cooperative. Normal mood and affect

## 2012-07-28 NOTE — Assessment & Plan Note (Signed)
Hemoccult-positive stools could be related to his NSAID use. Occult GI bleeding from colonic polyps, AVMs or neoplasm or ulcerations anywhere along the GI tract are possibilities.  Recommendations #1 colonoscopy #2 followup Hemoccults #3 to consider upper endoscopy if he is still Hemoccult-positive and colonoscopy is negative

## 2012-07-28 NOTE — Patient Instructions (Addendum)
You have been scheduled for your Colonoscopy on 08/05/2012 at 9am Separate instructions have been given If you use inhalers bring them with you to your appointment Go to the basement for your lab kit today Pleasse return within two weeks

## 2012-07-29 ENCOUNTER — Other Ambulatory Visit: Payer: Medicare Other

## 2012-07-29 DIAGNOSIS — R195 Other fecal abnormalities: Secondary | ICD-10-CM

## 2012-08-01 ENCOUNTER — Other Ambulatory Visit: Payer: Self-pay | Admitting: Physician Assistant

## 2012-08-05 ENCOUNTER — Ambulatory Visit (AMBULATORY_SURGERY_CENTER): Payer: Medicare Other | Admitting: Gastroenterology

## 2012-08-05 ENCOUNTER — Encounter: Payer: Self-pay | Admitting: Gastroenterology

## 2012-08-05 VITALS — BP 143/83 | HR 70 | Temp 98.6°F | Resp 16 | Ht 64.0 in | Wt 230.0 lb

## 2012-08-05 DIAGNOSIS — K5730 Diverticulosis of large intestine without perforation or abscess without bleeding: Secondary | ICD-10-CM

## 2012-08-05 DIAGNOSIS — R195 Other fecal abnormalities: Secondary | ICD-10-CM

## 2012-08-05 MED ORDER — SODIUM CHLORIDE 0.9 % IV SOLN: 500.0000 mL | INTRAVENOUS | Status: DC

## 2012-08-05 NOTE — Progress Notes (Signed)
Hemoccults given for pt to take home; directions explained  Patient did not experience any of the following events: a burn prior to discharge; a fall within the facility; wrong site/side/patient/procedure/implant event; or a hospital transfer or hospital admission upon discharge from the facility. [phone]) Patient did not have preoperative order for IV antibiotic SSI prophylaxis. [phone])

## 2012-08-05 NOTE — Op Note (Signed)
[facility] [address] [place], [address]   COLONOSCOPY PROCEDURE REPORT  PATIENT: Bryan [name], Bryan [name]  MR#: [id number] BIRTHDATE: 07-11-1946 , 65  yrs. old GENDER: Male ENDOSCOPIST: Louis Meckel, MD REFERRED [id number] Copland, M.D. PROCEDURE DATE:  08/05/2012 PROCEDURE:   Colonoscopy, diagnostic ASA CLASS:   Class II INDICATIONS:heme-positive stool. MEDICATIONS: Propofol (Diprivan) 140 mg IV  DESCRIPTION OF PROCEDURE:   After the risks benefits and alternatives of the procedure were thoroughly explained, informed consent was obtained.  A digital rectal exam revealed no abnormalities of the rectum.   The LB [id number]  endoscope was introduced through the anus and advanced to the cecum, which was identified by both the appendix and ileocecal valve. No adverse events experienced.   The quality of the prep was Suprep good  The instrument was then slowly withdrawn as the colon was fully examined.      COLON FINDINGS: Moderate diverticulosis was noted in the ascending colon.   Moderate diverticulosis was noted in the descending colon. The colon mucosa was otherwise normal.  Retroflexed views revealed no abnormalities. The time to cecum=3 minutes 45 seconds. Withdrawal time=6 minutes 55 seconds.  The scope was withdrawn and the procedure completed. COMPLICATIONS: There were no complications.  ENDOSCOPIC IMPRESSION: 1.   Moderate diverticulosis was noted in the ascending colon 2.   Moderate diverticulosis was noted in the descending colon 3.   The colon mucosa was otherwise normal  RECOMMENDATIONS: 1.  followup hemeoccults 2.  Continue current colorectal screening recommendations for "routine risk" patients with a repeat colonoscopy in 10 years.   eSigned:  Louis Meckel, MD 08/05/2012 9:41 AM   cc:

## 2012-08-05 NOTE — Patient Instructions (Addendum)
YOU HAD AN ENDOSCOPIC PROCEDURE TODAY AT THE [facility]: Refer to the procedure report that was given to you for any specific questions about what was found during the examination.  If the procedure report does not answer your questions, please call your gastroenterologist to clarify.  If you requested that your care partner not be given the details of your procedure findings, then the procedure report has been included in a sealed envelope for you to review at your convenience later.  YOU SHOULD EXPECT: Some feelings of bloating in the abdomen. Passage of more gas than usual.  Walking can help get rid of the air that was put into your GI tract during the procedure and reduce the bloating. If you had a lower endoscopy (such as a colonoscopy or flexible sigmoidoscopy) you may notice spotting of blood in your stool or on the toilet paper. If you underwent a bowel prep for your procedure, then you may not have a normal bowel movement for a few days.  DIET: Your first meal following the procedure should be a light meal and then it is ok to progress to your normal diet.  A half-sandwich or bowl of soup is an example of a good first meal.  Heavy or fried foods are harder to digest and may make you feel nauseous or bloated.  Likewise meals heavy in dairy and vegetables can cause extra gas to form and this can also increase the bloating.  Drink plenty of fluids but you should avoid alcoholic beverages for 24 hours.  ACTIVITY: Your care partner should take you home directly after the procedure.  You should plan to take it easy, moving slowly for the rest of the day.  You can resume normal activity the day after the procedure however you should NOT DRIVE or use heavy machinery for 24 hours (because of the sedation medicines used during the test).    SYMPTOMS TO REPORT IMMEDIATELY: A gastroenterologist can be reached at any hour.  During normal business hours, 8:30 AM to 5:00 PM Monday through Friday,  call [phone].  After hours and on weekends, please call the GI answering service at [phone] who will take a message and have the physician on call contact you.   Following lower endoscopy (colonoscopy or flexible sigmoidoscopy):  Excessive amounts of blood in the stool  Significant tenderness or worsening of abdominal pains  Swelling of the abdomen that is new, acute  Fever of 100F or higher  FOLLOW UP:   Our staff will call the home number listed on your records the next business day following your procedure to check on you and address any questions or concerns that you may have at that time regarding the information given to you following your procedure. This is a courtesy call and so if there is no answer at the home number and we have not heard from you through the emergency physician on call, we will assume that you have returned to your regular daily activities without incident.  SIGNATURES/CONFIDENTIALITY: You and/or your care partner have signed paperwork which will be entered into your electronic medical record.  These signatures attest to the fact that that the information above on your After Visit Summary has been reviewed and is understood.  Full responsibility of the confidentiality of this discharge information lies with you and/or your care-partner.   Ok to resume your medications  Follow up colonoscopy in 10 years  Please follow directions for repeat hemoccults and mail back to  Dr. Marzetta Board office

## 2012-08-06 ENCOUNTER — Telehealth: Payer: Self-pay | Admitting: *Deleted

## 2012-08-06 NOTE — Telephone Encounter (Signed)
No answer, message left for the patient. 

## 2012-08-12 ENCOUNTER — Other Ambulatory Visit: Payer: Medicare Other

## 2012-08-12 LAB — HEMOCCULT SLIDES (X 3 CARDS)

## 2012-08-13 NOTE — Progress Notes (Signed)
Quick Note:  Please inform the patient that hemeoccults were normal and to continue current plan of action And send cc to PCP ______

## 2012-09-10 ENCOUNTER — Telehealth: Payer: Self-pay

## 2012-09-10 NOTE — Telephone Encounter (Signed)
Pt called and states he had to have a drug test done (not here) and states penbutolol showed on the drug test. Pt wants to know what rx prescribed here would have that in it? Please call pt to advise

## 2012-09-10 NOTE — Telephone Encounter (Signed)
Spoke with patient and he tested positive for phenobarbital. Advised patient no medications he is currently taking or that we prescribed contains that.

## 2012-09-12 ENCOUNTER — Ambulatory Visit (INDEPENDENT_AMBULATORY_CARE_PROVIDER_SITE_OTHER): Payer: BC Managed Care – PPO | Admitting: Family Medicine

## 2012-09-12 VITALS — BP 166/83 | HR 87 | Temp 98.1°F | Resp 20 | Ht 64.0 in | Wt 230.0 lb

## 2012-09-12 DIAGNOSIS — T887XXA Unspecified adverse effect of drug or medicament, initial encounter: Secondary | ICD-10-CM
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
## 2012-09-28 ENCOUNTER — Other Ambulatory Visit: Payer: Self-pay | Admitting: Physician Assistant

## 2012-10-26 ENCOUNTER — Ambulatory Visit (INDEPENDENT_AMBULATORY_CARE_PROVIDER_SITE_OTHER): Payer: BC Managed Care – PPO | Admitting: Internal Medicine

## 2012-10-26 VITALS — BP 130/72 | HR 71 | Temp 98.0°F | Resp 16 | Ht 65.5 in | Wt 231.2 lb

## 2012-10-26 DIAGNOSIS — R894 Abnormal immunological findings in specimens from other organs, systems and tissues: Secondary | ICD-10-CM

## 2012-10-26 DIAGNOSIS — M069 Rheumatoid arthritis, unspecified: Secondary | ICD-10-CM | POA: Insufficient documentation

## 2012-10-26 DIAGNOSIS — M25519 Pain in unspecified shoulder: Secondary | ICD-10-CM

## 2012-10-26 DIAGNOSIS — R768 Other specified abnormal immunological findings in serum: Secondary | ICD-10-CM

## 2012-10-26 MED ORDER — PREDNISONE 20 MG PO TABS: ORAL_TABLET | ORAL | Status: DC

## 2012-10-26 NOTE — Patient Instructions (Addendum)
Do shoulder exercises daily for 1-2 months

## 2012-10-26 NOTE — Progress Notes (Signed)
  Subjective:    Patient ID: Bryan [name], male    DOB: 10-31-1946, 66 y.o.   MRN: [id number]  HPI complaining of pain in the left shoulder for 7-10 days This started while he was working on the back yard fence Pain is worse with range of motion above head and particularly bad at night preventing adequate sleep No neck pain No numbness in the hand  History of shoulder pain like this 1218 months ago that responded to an injection He is rheumatoid factor positive He has arthritis in multiple other joints  Review of Systems     Objective:   Physical Exam No acute distress Left shoulder has pain in the range of motion above 90 Abduction and abduction are pain free He can abduct against resistance without pain External rotation painful Nontender over the biceps tendon and the deltoid Nontender over the posterior shoulder       Assessment & Plan:  Problem #1 adhesive capsulitis/rotator cuff tendinitis  Meds ordered this encounter  Medications  . predniSONE (DELTASONE) 20 MG tablet    Sig: 4/3/3/2/2/1/1single daily dose for 7 days    Dispense:  16 tablet    Refill:  0   Exercises daily for 2 months given as handout If fails  Consider injection and/or physical therapy

## 2012-11-03 ENCOUNTER — Telehealth: Payer: Self-pay

## 2012-11-03 MED ORDER — LOSARTAN POTASSIUM-HCTZ 50-12.5 MG PO TABS: 1.0000 | ORAL_TABLET | Freq: Every day | ORAL | Status: DC

## 2012-11-03 NOTE — Telephone Encounter (Signed)
I have sent a one month supply to [facility] on [address].  Patient needs OV for more.  From Dr. Angelyn Punt note in July, it sounds like his primary is the [place] in [place], is this true?  If so, he needs to get this medication from them

## 2012-11-03 NOTE — Telephone Encounter (Signed)
Requesting a refill on his losartan   Best number [phone]

## 2012-11-04 NOTE — Telephone Encounter (Signed)
Pt notified that rx was sent in and that he would need an ov.  He will using [place] as his primary.

## 2012-12-07 ENCOUNTER — Ambulatory Visit (INDEPENDENT_AMBULATORY_CARE_PROVIDER_SITE_OTHER): Payer: Medicare Other | Admitting: Family Medicine

## 2012-12-07 VITALS — BP 147/70 | HR 78 | Temp 98.0°F | Resp 18 | Ht 64.5 in | Wt 229.0 lb

## 2012-12-07 DIAGNOSIS — I10 Essential (primary) hypertension: Secondary | ICD-10-CM

## 2012-12-07 LAB — BASIC METABOLIC PANEL
CO2: 30 mEq/L (ref 19–32)
Glucose, Bld: 85 mg/dL (ref 70–99)
Potassium: 4.2 mEq/L (ref 3.5–5.3)
Sodium: 141 mEq/L (ref 135–145)

## 2012-12-07 MED ORDER — LOSARTAN POTASSIUM-HCTZ 50-12.5 MG PO TABS: 1.0000 | ORAL_TABLET | Freq: Every day | ORAL | Status: DC

## 2012-12-07 NOTE — Patient Instructions (Signed)
Continue the same dose of medicine for now, but keep a record of your blood pressures outside of the office and bring them to the next office visit. Recheck in next 6 months for a physical. If blood pressures consistently running over 140/90 - may need to be seen sooner to determine if a change in your medicines is needed.  Return to the clinic or go to the nearest emergency room if any of your symptoms worsen or new symptoms occur.

## 2012-12-07 NOTE — Progress Notes (Signed)
  Subjective:    Patient ID: Bryan [name], male    DOB: June 09, 1946, 67 y.o.   MRN: [id number]  HPI  Here for refill of blood pressure medicine. Feel like is here frequently to discuss this, but no recent HTN. Taking losartan HCTZ QD. No new side effects of this medicine.  Outside Bp's: 140/70. Last creatinine 1.08 in June 2013. Took last pill today.     Review of Systems  Constitutional: Negative for fatigue and unexpected weight change.  Eyes: Negative for visual disturbance.  Respiratory: Negative for cough, chest tightness and shortness of breath.   Cardiovascular: Negative for chest pain, palpitations and leg swelling.  Gastrointestinal: Negative for abdominal pain and blood in stool.  Neurological: Negative for dizziness, light-headedness and headaches.       Objective:   Physical Exam  Vitals reviewed. Constitutional: He is oriented to person, place, and time. He appears well-developed and well-nourished.  HENT:  Head: Normocephalic and atraumatic.  Eyes: EOM are normal. Pupils are equal, round, and reactive to light.  Neck: No JVD present. Carotid bruit is not present.  Cardiovascular: Normal rate, regular rhythm and normal heart sounds.   No murmur heard. Pulmonary/Chest: Effort normal and breath sounds normal. He has no rales.  Abdominal: He exhibits no pulsatile midline mass.  Musculoskeletal: He exhibits no edema.  Neurological: He is alert and oriented to person, place, and time.  Skin: Skin is warm and dry.  Psychiatric: He has a normal mood and affect.       Assessment & Plan:  Bryan [name] is a 67 y.o. male 1. Unspecified essential hypertension  Basic metabolic panel, losartan-hydrochlorothiazide (HYZAAR) 50-12.5 MG per tablet   Borderline readings - check at home.  Refilled meds for 6 months. Discussed reasons for Q 10month eval for blood pressure and labs, and need for CPE in 6 months, understanding expressed.   Patient Instructions  Continue the same  dose of medicine for now, but keep a record of your blood pressures outside of the office and bring them to the next office visit. Recheck in next 6 months for a physical. If blood pressures consistently running over 140/90 - may need to be seen sooner to determine if a change in your medicines is needed.  Return to the clinic or go to the nearest emergency room if any of your symptoms worsen or new symptoms occur.

## 2012-12-27 ENCOUNTER — Other Ambulatory Visit: Payer: Self-pay

## 2013-01-19 ENCOUNTER — Ambulatory Visit (INDEPENDENT_AMBULATORY_CARE_PROVIDER_SITE_OTHER): Payer: Medicare Other | Admitting: Family Medicine

## 2013-01-19 ENCOUNTER — Ambulatory Visit: Payer: Medicare Other

## 2013-01-19 VITALS — BP 140/64 | HR 84 | Temp 98.2°F | Resp 18 | Ht 64.0 in | Wt 229.4 lb

## 2013-01-19 DIAGNOSIS — M7582 Other shoulder lesions, left shoulder: Secondary | ICD-10-CM

## 2013-01-19 DIAGNOSIS — M25562 Pain in left knee: Secondary | ICD-10-CM

## 2013-01-19 DIAGNOSIS — M25569 Pain in unspecified knee: Secondary | ICD-10-CM

## 2013-01-19 DIAGNOSIS — M25561 Pain in right knee: Secondary | ICD-10-CM

## 2013-01-19 DIAGNOSIS — M67919 Unspecified disorder of synovium and tendon, unspecified shoulder: Secondary | ICD-10-CM

## 2013-01-19 DIAGNOSIS — R0683 Snoring: Secondary | ICD-10-CM

## 2013-01-19 DIAGNOSIS — R0989 Other specified symptoms and signs involving the circulatory and respiratory systems: Secondary | ICD-10-CM

## 2013-01-19 NOTE — Patient Instructions (Addendum)
We are gong to refer you to physical therapy for your shoulders - I think that you have rotator cuff tendonitis.  Please work on the exercises that we have given you.    We are going to have you do a sleep study to see if you may have sleep apnea.  If so, CPAP treatment will help you feel better!     In the meantime, please work on weight loss to take pressure off your knees You may split your mobic in half and take 7.5 mg twice a day.  You may use tylenol as well before bed for your shoulder pain.  Do not take more than 1500 mg a day of tylenol.  However, you can increase your primidone to 60 mg again if you like- this may help you to sleep better at night.  Call me if you are not getting any relief with these measures
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
## 2013-02-04 ENCOUNTER — Encounter (HOSPITAL_BASED_OUTPATIENT_CLINIC_OR_DEPARTMENT_OTHER): Payer: Medicare Other

## 2013-02-06 ENCOUNTER — Encounter: Payer: Self-pay | Admitting: Family Medicine

## 2013-02-09 ENCOUNTER — Ambulatory Visit (HOSPITAL_BASED_OUTPATIENT_CLINIC_OR_DEPARTMENT_OTHER): Payer: Medicare Other | Attending: Family Medicine | Admitting: Radiology

## 2013-02-09 VITALS — Ht 65.0 in | Wt 224.0 lb

## 2013-02-09 DIAGNOSIS — Z9989 Dependence on other enabling machines and devices: Secondary | ICD-10-CM

## 2013-02-09 DIAGNOSIS — R0683 Snoring: Secondary | ICD-10-CM

## 2013-02-09 DIAGNOSIS — G4733 Obstructive sleep apnea (adult) (pediatric): Secondary | ICD-10-CM | POA: Insufficient documentation

## 2013-02-12 ENCOUNTER — Ambulatory Visit: Payer: Medicare Other

## 2013-02-12 ENCOUNTER — Ambulatory Visit (INDEPENDENT_AMBULATORY_CARE_PROVIDER_SITE_OTHER): Payer: BC Managed Care – PPO | Admitting: Emergency Medicine

## 2013-02-12 VITALS — BP 162/84 | HR 81 | Temp 98.3°F | Resp 17 | Ht 64.5 in | Wt 229.0 lb

## 2013-02-12 DIAGNOSIS — M542 Cervicalgia: Secondary | ICD-10-CM | POA: Diagnosis not present

## 2013-02-12 DIAGNOSIS — M25511 Pain in right shoulder: Secondary | ICD-10-CM

## 2013-02-12 DIAGNOSIS — M25519 Pain in unspecified shoulder: Secondary | ICD-10-CM

## 2013-02-12 DIAGNOSIS — M5010 Cervical disc disorder with radiculopathy, unspecified cervical region: Secondary | ICD-10-CM

## 2013-02-12 DIAGNOSIS — Z23 Encounter for immunization: Secondary | ICD-10-CM

## 2013-02-12 MED ORDER — GABAPENTIN 100 MG PO CAPS: ORAL_CAPSULE | ORAL | Status: DC

## 2013-02-12 NOTE — Progress Notes (Signed)
  Subjective:    Patient ID: Bryan [name], male    DOB: 1946/06/20, 67 y.o.   MRN: [id number]  HPI patient has been going to therapy for her shoulder. He has signs and symptoms of a radiculopathy involving the right arm. He has significant multilevel cervical disc disease on x-rays done 2 years ago    Review of Systems     Objective:   Physical Exam patient holds his neck in flexion. He has very limited motion to the right. He has better motion to the left. He has limited flexion and extension. There is no focal weakness of the upper examination and reflexes are symmetrical the    [facility] reading (PRIMARY) by  Dr. Cleta Alberts patient has severe degenerative disc disease with arthritic changes C4-5-6 and 7.      Assessment & Plan:  Update from the [place] [facility] states patient is in need of shingles vaccine and pneumonia vaccine. He was given a Pneumovax and prescription for shingles vaccine. I have scheduled him for a MRI of his cervical spine. With Neurontin 100 mg to take 2 at night one in the morning for the radiculopathy symptoms

## 2013-02-12 NOTE — Patient Instructions (Signed)
Cervical Radiculopathy  Cervical radiculopathy happens when a nerve in the neck is pinched or bruised by a slipped (herniated) disk or by arthritic changes in the bones of the cervical spine. This can occur due to an injury or as part of the normal aging process. Pressure on the cervical nerves can cause pain or numbness that runs from your neck all the way down into your arm and fingers.  CAUSES   There are many possible causes, including:   Injury.   Muscle tightness in the neck from overuse.   Swollen, painful joints (arthritis).   Breakdown or degeneration in the bones and joints of the spine (spondylosis) due to aging.   Bone spurs that may develop near the cervical nerves.  SYMPTOMS   Symptoms include pain, weakness, or numbness in the affected arm and hand. Pain can be severe or irritating. Symptoms may be worse when extending or turning the neck.  DIAGNOSIS   Your caregiver will ask about your symptoms and do a physical exam. He or she may test your strength and reflexes. X-rays, CT scans, and MRI scans may be needed in cases of injury or if the symptoms do not go away after a period of time. Electromyography (EMG) or nerve conduction testing may be done to study how your nerves and muscles are working.  TREATMENT   Your caregiver may recommend certain exercises to help relieve your symptoms. Cervical radiculopathy can, and often does, get better with time and treatment. If your problems continue, treatment options may include:   Wearing a soft collar for short periods of time.   Physical therapy to strengthen the neck muscles.   Medicines, such as nonsteroidal anti-inflammatory drugs (NSAIDs), oral corticosteroids, or spinal injections.   Surgery. Different types of surgery may be done depending on the cause of your problems.  HOME CARE INSTRUCTIONS    Put ice on the affected area.   Put ice in a plastic bag.   Place a towel between your skin and the bag.   Leave the ice on for 15 to 20  minutes, 3 to 4 times a day or as directed by your caregiver.   If ice does not help, you can try using heat. Take a warm shower or bath, or use a hot water bottle as directed by your caregiver.   You may try a gentle neck and shoulder massage.   Use a flat pillow when you sleep.   Only take over-the-counter or prescription medicines for pain, discomfort, or fever as directed by your caregiver.   If physical therapy was prescribed, follow your caregiver's directions.   If a soft collar was prescribed, use it as directed.  SEEK IMMEDIATE MEDICAL CARE IF:    Your pain gets much worse and cannot be controlled with medicines.   You have weakness or numbness in your hand, arm, face, or leg.   You have a high fever or a stiff, rigid neck.   You lose bowel or bladder control (incontinence).   You have trouble with walking, balance, or speaking.  MAKE SURE YOU:    Understand these instructions.   Will watch your condition.   Will get help right away if you are not doing well or get worse.  Document Released: 07/24/2001 Document Revised: 01/21/2012 Document Reviewed: 06/12/2011  ExitCare Patient Information 2013 [employer].

## 2013-02-14 DIAGNOSIS — R0989 Other specified symptoms and signs involving the circulatory and respiratory systems: Secondary | ICD-10-CM

## 2013-02-14 DIAGNOSIS — G4733 Obstructive sleep apnea (adult) (pediatric): Secondary | ICD-10-CM

## 2013-02-14 DIAGNOSIS — R0609 Other forms of dyspnea: Secondary | ICD-10-CM

## 2013-02-14 NOTE — Procedures (Signed)
Bryan [name], INDELICATO NO.:  [id number]  MEDICAL RECORD NO.:  [id number]          PATIENT TYPE:  OUT  LOCATION:  SLEEP CENTER                 FACILITY:  [facility]  PHYSICIAN:  Taejon Irani D. Maple Hudson, MD, FCCP, FACPDATE OF BIRTH:  02/24/44  DATE OF STUDY:  02/09/2013                           NOCTURNAL POLYSOMNOGRAM  REFERRING PHYSICIAN:  Gwenlyn Found Copland, MD  INDICATION FOR STUDY:  Hypersomnia with sleep apnea.  EPWORTH SLEEPINESS SCORE:  15/24.  BMI 37.3, weight 224 pounds, height 65 inches, neck 18 inches.  MEDICATIONS:  Charted and reviewed.  SLEEP ARCHITECTURE:  Split study protocol.  During the diagnostic phase, total sleep time 128.5 minutes with sleep efficiency 86.8%.  Stage I was 6.6%, stage II 72.4%, stage III absent, REM 21% of total sleep time. Sleep latency 13 minutes, REM latency 49 minutes.  Awake after sleep onset 6.5 minutes.  Arousal index 9.3.  BEDTIME MEDICATION:  Flomax, Mysoline.  RESPIRATORY DATA:  Split study protocol.  Apnea/hypopnea index (AHI) 36.4 per hour.  A total of 78 events was scored including 9 obstructive apneas and 69 hypopneas.  Events were associated with nonsupine sleep position.  REM AHI 93.3 per hour.  CPAP was titrated to 18 CWP, with a residual AHI of 21.8 per hour.  He was then changed to bilevel (BiPAP) and titrated to an inspiratory pressure of 24, with expiratory pressure of 18 for a residual AHI of 17.6 per hour.  He wore a medium ResMed Mirage Quattro FX full-face mask with heated humidifier.  The technician was unable to completely titrate.  OXYGEN DATA:  Before CPAP, snoring was moderately loud with oxygen desaturation to a nadir of 85% on room air.  With CPAP titration, mean oxygen saturation held 96.5% on room air and snoring was almost completely removed.  CARDIAC DATA:  Sinus rhythm with frequent PACs and PVCs.  MOVEMENT/PARASOMNIA:  No significant movement disturbance. Bathroom  x1.  IMPRESSION/RECOMMENDATION: 1. Severe obstructive sleep apnea/hypopnea syndrome, AHI 36.4 per hour     with moderately loud snoring and oxygen desaturation to a nadir of     85% on room air. 2. CPAP was titrated to 18 CWP, with a residual AHI of 31.8 per hour.     He was then titrated with bilevel (BiPAP) to a final inspiratory     pressure of 24, and expiratory pressure of 18.  This gave AHI 17.6     per hour reflecting a few residual events.  He wore a medium ResMed     Mirage Quattro FX full-face mask with heated     humidifier.  Suggest that he be started at home with BiPAP,     inspiratory 24, and expiratory 18 CWP, and failed to control with     CPAP on the study.     Darean Rote D. Maple Hudson, MD, [facility], FACP Diplomate, American Board of Sleep Medicine    CDY/MEDQ  D:  02/14/2013 14:11:46  T:  02/14/2013 23:42:55  Job:  [id number]

## 2013-02-17 ENCOUNTER — Encounter: Payer: Self-pay | Admitting: Family Medicine

## 2013-02-21 ENCOUNTER — Ambulatory Visit
Admission: RE | Admit: 2013-02-21 | Discharge: 2013-02-21 | Disposition: A | Discharge status: Home / Self Care | Payer: Medicare Other | Source: Ambulatory Visit | Attending: Emergency Medicine | Admitting: Emergency Medicine

## 2013-02-21 DIAGNOSIS — M5010 Cervical disc disorder with radiculopathy, unspecified cervical region: Secondary | ICD-10-CM

## 2013-02-26 ENCOUNTER — Telehealth: Payer: Self-pay

## 2013-02-26 DIAGNOSIS — M5030 Other cervical disc degeneration, unspecified cervical region: Secondary | ICD-10-CM

## 2013-02-26 NOTE — Telephone Encounter (Signed)
Patient advised of results of MRI and referral for neurosurgeon.

## 2013-02-26 NOTE — Telephone Encounter (Signed)
Patient says his doctor called him but did not call him on his cell phone--that is the number that we need to call him on

## 2013-03-11 ENCOUNTER — Telehealth: Payer: Self-pay

## 2013-03-11 NOTE — Telephone Encounter (Signed)
Should be to Neurosurgeon, looks like this has been sent to [place]. Will you check on this for him?

## 2013-03-11 NOTE — Telephone Encounter (Signed)
Patient was on the referrals voice mail requesting a referral to a neurologist

## 2013-03-18 ENCOUNTER — Other Ambulatory Visit: Payer: Self-pay | Admitting: Neurosurgery

## 2013-03-27 ENCOUNTER — Encounter (HOSPITAL_COMMUNITY): Payer: Self-pay

## 2013-03-27 ENCOUNTER — Encounter (HOSPITAL_COMMUNITY)
Admission: RE | Admit: 2013-03-27 | Discharge: 2013-03-27 | Disposition: A | Discharge status: Home / Self Care | Payer: BC Managed Care – PPO | Source: Ambulatory Visit | Attending: Neurosurgery | Admitting: Neurosurgery

## 2013-03-27 HISTORY — DX: Gastro-esophageal reflux disease without esophagitis: K21.9

## 2013-03-27 HISTORY — DX: Sleep apnea, unspecified: G47.30

## 2013-03-27 HISTORY — DX: Unspecified hearing loss, unspecified ear: H91.90

## 2013-03-27 HISTORY — DX: Essential tremor: G25.0

## 2013-03-27 LAB — PROTIME-INR: INR: 1 (ref 0.00–1.49)

## 2013-03-27 LAB — BASIC METABOLIC PANEL
BUN: 20 mg/dL (ref 6–23)
CO2: 26 mEq/L (ref 19–32)
Chloride: 100 mEq/L (ref 96–112)
Creatinine, Ser: 1.04 mg/dL (ref 0.50–1.35)
GFR calc Af Amer: 83 mL/min — ABNORMAL LOW (ref >90)
Glucose, Bld: 80 mg/dL (ref 70–99)
Potassium: 4 mEq/L (ref 3.5–5.1)

## 2013-03-27 LAB — CBC WITH DIFFERENTIAL/PLATELET
Basophils Absolute: 0 10*3/uL (ref 0.0–0.1)
Basophils Relative: 0 % (ref 0–1)
Eosinophils Absolute: 0.2 10*3/uL (ref 0.0–0.7)
Eosinophils Relative: 3 % (ref 0–5)
HCT: 39.8 % (ref 39.0–52.0)
Hemoglobin: 13.2 g/dL (ref 13.0–17.0)
MCH: 29.7 pg (ref 26.0–34.0)
MCHC: 33.2 g/dL (ref 30.0–36.0)
MCV: 89.6 fL (ref 78.0–100.0)
Monocytes Absolute: 0.5 10*3/uL (ref 0.1–1.0)
Monocytes Relative: 7 % (ref 3–12)
Neutro Abs: 4.4 10*3/uL (ref 1.7–7.7)
RDW: 14 % (ref 11.5–15.5)

## 2013-03-27 LAB — NO BLOOD PRODUCTS

## 2013-03-27 LAB — URINALYSIS, ROUTINE W REFLEX MICROSCOPIC
Bilirubin Urine: NEGATIVE
Hgb urine dipstick: NEGATIVE
Ketones, ur: NEGATIVE mg/dL
Nitrite: NEGATIVE
Specific Gravity, Urine: 1.019 (ref 1.005–1.030)
Urobilinogen, UA: 0.2 mg/dL (ref 0.0–1.0)

## 2013-03-27 LAB — APTT: aPTT: 29 seconds (ref 24–37)

## 2013-03-27 LAB — SURGICAL PCR SCREEN: Staphylococcus aureus: NEGATIVE

## 2013-03-27 NOTE — Pre-Procedure Instructions (Signed)
Bryan [name]  03/27/2013   Your procedure is scheduled on:  Monday, May 19th  Report to [facility] at 0700 AM.  Call this number if you have problems the morning of surgery: [phone]   Remember:   Do not eat food or drink liquids after midnight.    Take these medicines the morning of surgery with A SIP OF WATER: Flomax, Neurontin   Do not wear jewelry.  Do not wear lotions, powders, or perfumes, deodorant.  Do not shave 48 hours prior to surgery. Men may shave face and neck.  Do not bring valuables to the hospital.  Contacts, dentures or bridgework may not be worn into surgery.  Leave suitcase in the car. After surgery it may be brought to your room.  For patients admitted to the hospital, checkout time is 11:00 AM the day of discharge.   Patients discharged the day of surgery will not be allowed to drive home.    Special Instructions: Shower using CHG 2 nights before surgery and the night before surgery.  If you shower the day of surgery use CHG.  Use special wash - you have one bottle of CHG for all showers.  You should use approximately 1/3 of the bottle for each shower.   Please read over the following fact sheets that you were given: Pain Booklet, Coughing and Deep Breathing, MRSA Information and Surgical Site Infection Prevention

## 2013-03-27 NOTE — Progress Notes (Signed)
Pt. Denies ever having any echo or stress test.

## 2013-03-29 MED ORDER — VANCOMYCIN HCL 10 G IV SOLR
1500.0000 mg | INTRAVENOUS | Status: AC
  Administered 2013-03-30: 1500 mg via INTRAVENOUS
  Filled 2013-03-29: qty 1500

## 2013-03-30 ENCOUNTER — Encounter (HOSPITAL_COMMUNITY)
Admission: RE | Disposition: A | Discharge status: Home / Self Care | Payer: Self-pay | Source: Ambulatory Visit | Attending: Neurosurgery

## 2013-03-30 ENCOUNTER — Encounter (HOSPITAL_COMMUNITY): Payer: Self-pay | Admitting: Certified Registered"

## 2013-03-30 ENCOUNTER — Observation Stay (HOSPITAL_COMMUNITY)
Admission: RE | Admit: 2013-03-30 | Discharge: 2013-03-31 | DRG: 865 | Disposition: A | Discharge status: Home / Self Care | Payer: BC Managed Care – PPO | Source: Ambulatory Visit | Attending: Neurosurgery | Admitting: Neurosurgery

## 2013-03-30 ENCOUNTER — Ambulatory Visit (HOSPITAL_COMMUNITY): Payer: BC Managed Care – PPO

## 2013-03-30 ENCOUNTER — Ambulatory Visit (HOSPITAL_COMMUNITY): Payer: BC Managed Care – PPO | Admitting: Certified Registered"

## 2013-03-30 DIAGNOSIS — Z01818 Encounter for other preprocedural examination: Secondary | ICD-10-CM

## 2013-03-30 DIAGNOSIS — M5020 Other cervical disc displacement, unspecified cervical region: Secondary | ICD-10-CM | POA: Diagnosis present

## 2013-03-30 DIAGNOSIS — Z7982 Long term (current) use of aspirin: Secondary | ICD-10-CM

## 2013-03-30 DIAGNOSIS — Z79899 Other long term (current) drug therapy: Secondary | ICD-10-CM

## 2013-03-30 DIAGNOSIS — M47812 Spondylosis without myelopathy or radiculopathy, cervical region: Principal | ICD-10-CM | POA: Diagnosis present

## 2013-03-30 HISTORY — PX: ANTERIOR CERVICAL DECOMP/DISCECTOMY FUSION: SHX1161

## 2013-03-30 SURGERY — ANTERIOR CERVICAL DECOMPRESSION/DISCECTOMY FUSION 2 LEVELS
Anesthesia: General | Site: Neck | Wound class: Clean

## 2013-03-30 MED ORDER — PRIMIDONE 50 MG PO TABS
50.0000 mg | ORAL_TABLET | Freq: Every day | ORAL | Status: DC
  Administered 2013-03-30: 50 mg via ORAL
  Filled 2013-03-30 (×2): qty 1

## 2013-03-30 MED ORDER — HEMOSTATIC AGENTS (NO CHARGE) OPTIME
TOPICAL | Status: DC | PRN
  Administered 2013-03-30 (×2): 1 via TOPICAL

## 2013-03-30 MED ORDER — ACETAMINOPHEN 10 MG/ML IV SOLN
INTRAVENOUS | Status: AC
  Administered 2013-03-30: 1000 mg via INTRAVENOUS
  Filled 2013-03-30: qty 100

## 2013-03-30 MED ORDER — THROMBIN 5000 UNITS EX SOLR
CUTANEOUS | Status: DC | PRN
  Administered 2013-03-30 (×4): 5000 [IU] via TOPICAL

## 2013-03-30 MED ORDER — HYDROMORPHONE HCL PF 1 MG/ML IJ SOLN
INTRAMUSCULAR | Status: AC
  Filled 2013-03-30: qty 1

## 2013-03-30 MED ORDER — MORPHINE SULFATE 2 MG/ML IJ SOLN
1.0000 mg | INTRAMUSCULAR | Status: DC | PRN
  Administered 2013-03-30: 2 mg via INTRAVENOUS
  Filled 2013-03-30: qty 1

## 2013-03-30 MED ORDER — BACITRACIN 50000 UNITS IM SOLR
INTRAMUSCULAR | Status: AC
  Filled 2013-03-30: qty 1

## 2013-03-30 MED ORDER — SODIUM CHLORIDE 0.9 % IV SOLN
INTRAVENOUS | Status: AC
  Filled 2013-03-30: qty 500

## 2013-03-30 MED ORDER — PROMETHAZINE HCL 25 MG PO TABS: 12.5000 mg | ORAL_TABLET | ORAL | Status: DC | PRN

## 2013-03-30 MED ORDER — SODIUM CHLORIDE 0.9 % IJ SOLN: 3.0000 mL | INTRAMUSCULAR | Status: DC | PRN

## 2013-03-30 MED ORDER — KETOROLAC TROMETHAMINE 30 MG/ML IJ SOLN
INTRAMUSCULAR | Status: AC
  Filled 2013-03-30: qty 1

## 2013-03-30 MED ORDER — ROCURONIUM BROMIDE 100 MG/10ML IV SOLN
INTRAVENOUS | Status: DC | PRN
  Administered 2013-03-30: 50 mg via INTRAVENOUS

## 2013-03-30 MED ORDER — SUFENTANIL CITRATE 50 MCG/ML IV SOLN
INTRAVENOUS | Status: DC | PRN
  Administered 2013-03-30: 10 ug via INTRAVENOUS
  Administered 2013-03-30: 20 ug via INTRAVENOUS
  Administered 2013-03-30: 5 ug via INTRAVENOUS

## 2013-03-30 MED ORDER — METHOCARBAMOL 500 MG PO TABS: 500.0000 mg | ORAL_TABLET | Freq: Four times a day (QID) | ORAL | Status: DC | PRN

## 2013-03-30 MED ORDER — MENTHOL 3 MG MT LOZG: 1.0000 | LOZENGE | OROMUCOSAL | Status: DC | PRN

## 2013-03-30 MED ORDER — GABAPENTIN 100 MG PO CAPS
200.0000 mg | ORAL_CAPSULE | Freq: Every day | ORAL | Status: DC
  Administered 2013-03-31: 200 mg via ORAL
  Filled 2013-03-30: qty 2

## 2013-03-30 MED ORDER — LACTATED RINGERS IV SOLN
INTRAVENOUS | Status: DC | PRN
  Administered 2013-03-30 (×2): via INTRAVENOUS

## 2013-03-30 MED ORDER — TAMSULOSIN HCL 0.4 MG PO CAPS
0.4000 mg | ORAL_CAPSULE | Freq: Every day | ORAL | Status: DC
  Administered 2013-03-30 – 2013-03-31 (×2): 0.4 mg via ORAL
  Filled 2013-03-30 (×2): qty 1

## 2013-03-30 MED ORDER — PROPOFOL 10 MG/ML IV BOLUS
INTRAVENOUS | Status: DC | PRN
  Administered 2013-03-30: 200 mg via INTRAVENOUS

## 2013-03-30 MED ORDER — LIDOCAINE HCL (CARDIAC) 20 MG/ML IV SOLN
INTRAVENOUS | Status: DC | PRN
  Administered 2013-03-30: 50 mg via INTRAVENOUS

## 2013-03-30 MED ORDER — OXYCODONE-ACETAMINOPHEN 5-325 MG PO TABS: 1.0000 | ORAL_TABLET | ORAL | Status: DC | PRN

## 2013-03-30 MED ORDER — PHENOL 1.4 % MT LIQD: 1.0000 | OROMUCOSAL | Status: DC | PRN

## 2013-03-30 MED ORDER — SODIUM CHLORIDE 0.9 % IJ SOLN
3.0000 mL | Freq: Two times a day (BID) | INTRAMUSCULAR | Status: DC
  Administered 2013-03-30 (×2): 3 mL via INTRAVENOUS

## 2013-03-30 MED ORDER — GABAPENTIN 300 MG PO CAPS
300.0000 mg | ORAL_CAPSULE | Freq: Every day | ORAL | Status: DC
  Administered 2013-03-30: 300 mg via ORAL
  Filled 2013-03-30 (×2): qty 1

## 2013-03-30 MED ORDER — KETOROLAC TROMETHAMINE 30 MG/ML IJ SOLN
30.0000 mg | Freq: Four times a day (QID) | INTRAMUSCULAR | Status: DC
  Administered 2013-03-30 – 2013-03-31 (×3): 30 mg via INTRAVENOUS
  Filled 2013-03-30 (×3): qty 1

## 2013-03-30 MED ORDER — VANCOMYCIN HCL IN DEXTROSE 1-5 GM/200ML-% IV SOLN
1000.0000 mg | Freq: Once | INTRAVENOUS | Status: AC
  Administered 2013-03-30: 1000 mg via INTRAVENOUS
  Filled 2013-03-30: qty 200

## 2013-03-30 MED ORDER — PROMETHAZINE HCL 25 MG/ML IJ SOLN: 12.5000 mg | INTRAMUSCULAR | Status: DC | PRN

## 2013-03-30 MED ORDER — 0.9 % SODIUM CHLORIDE (POUR BTL) OPTIME
TOPICAL | Status: DC | PRN
  Administered 2013-03-30: 1000 mL

## 2013-03-30 MED ORDER — ACETAMINOPHEN 650 MG RE SUPP: 650.0000 mg | RECTAL | Status: DC | PRN

## 2013-03-30 MED ORDER — LOSARTAN POTASSIUM 50 MG PO TABS
50.0000 mg | ORAL_TABLET | Freq: Every day | ORAL | Status: DC
  Administered 2013-03-30 – 2013-03-31 (×2): 50 mg via ORAL
  Filled 2013-03-30 (×2): qty 1

## 2013-03-30 MED ORDER — LIDOCAINE-EPINEPHRINE 1 %-1:100000 IJ SOLN
INTRAMUSCULAR | Status: DC | PRN
  Administered 2013-03-30: 10 mL

## 2013-03-30 MED ORDER — ZOLPIDEM TARTRATE 5 MG PO TABS: 5.0000 mg | ORAL_TABLET | Freq: Every evening | ORAL | Status: DC | PRN

## 2013-03-30 MED ORDER — HYDROCODONE-ACETAMINOPHEN 5-325 MG PO TABS: 1.0000 | ORAL_TABLET | ORAL | Status: DC | PRN

## 2013-03-30 MED ORDER — HYDROCHLOROTHIAZIDE 12.5 MG PO CAPS
12.5000 mg | ORAL_CAPSULE | Freq: Every day | ORAL | Status: DC
  Administered 2013-03-30 – 2013-03-31 (×2): 12.5 mg via ORAL
  Filled 2013-03-30 (×2): qty 1

## 2013-03-30 MED ORDER — HYDROMORPHONE HCL PF 1 MG/ML IJ SOLN
0.2500 mg | INTRAMUSCULAR | Status: DC | PRN
  Administered 2013-03-30: 0.5 mg via INTRAVENOUS

## 2013-03-30 MED ORDER — ACETAMINOPHEN 10 MG/ML IV SOLN: 1000.0000 mg | Freq: Once | INTRAVENOUS | Status: DC | PRN

## 2013-03-30 MED ORDER — ONDANSETRON HCL 4 MG/2ML IJ SOLN: 4.0000 mg | Freq: Once | INTRAMUSCULAR | Status: DC | PRN

## 2013-03-30 MED ORDER — DEXAMETHASONE SODIUM PHOSPHATE 4 MG/ML IJ SOLN
INTRAMUSCULAR | Status: DC | PRN
  Administered 2013-03-30: 8 mg via INTRAVENOUS

## 2013-03-30 MED ORDER — DOCUSATE SODIUM 100 MG PO CAPS
100.0000 mg | ORAL_CAPSULE | Freq: Two times a day (BID) | ORAL | Status: DC
  Administered 2013-03-30 – 2013-03-31 (×2): 100 mg via ORAL
  Filled 2013-03-30 (×2): qty 1

## 2013-03-30 MED ORDER — ACETAMINOPHEN 325 MG PO TABS: 650.0000 mg | ORAL_TABLET | ORAL | Status: DC | PRN

## 2013-03-30 MED ORDER — CYCLOBENZAPRINE HCL 10 MG PO TABS: 10.0000 mg | ORAL_TABLET | Freq: Three times a day (TID) | ORAL | Status: DC | PRN

## 2013-03-30 MED ORDER — ONDANSETRON HCL 4 MG/2ML IJ SOLN: 4.0000 mg | INTRAMUSCULAR | Status: DC | PRN

## 2013-03-30 MED ORDER — MAGNESIUM HYDROXIDE 400 MG/5ML PO SUSP: 30.0000 mL | Freq: Every day | ORAL | Status: DC | PRN

## 2013-03-30 MED ORDER — PHENYLEPHRINE HCL 10 MG/ML IJ SOLN
INTRAMUSCULAR | Status: DC | PRN
  Administered 2013-03-30: 80 ug via INTRAVENOUS
  Administered 2013-03-30: 160 ug via INTRAVENOUS
  Administered 2013-03-30 (×2): 80 ug via INTRAVENOUS

## 2013-03-30 MED ORDER — MIDAZOLAM HCL 5 MG/5ML IJ SOLN
INTRAMUSCULAR | Status: DC | PRN
  Administered 2013-03-30: 2 mg via INTRAVENOUS

## 2013-03-30 MED ORDER — BISACODYL 10 MG RE SUPP: 10.0000 mg | Freq: Every day | RECTAL | Status: DC | PRN

## 2013-03-30 MED ORDER — GABAPENTIN 100 MG PO CAPS: 200.0000 mg | ORAL_CAPSULE | Freq: Two times a day (BID) | ORAL | Status: DC

## 2013-03-30 MED ORDER — LOSARTAN POTASSIUM-HCTZ 50-12.5 MG PO TABS: 1.0000 | ORAL_TABLET | Freq: Every day | ORAL | Status: DC

## 2013-03-30 MED ORDER — DEXTROSE 5 % IV SOLN
500.0000 mg | Freq: Four times a day (QID) | INTRAVENOUS | Status: DC | PRN
  Filled 2013-03-30: qty 5

## 2013-03-30 MED ORDER — SODIUM CHLORIDE 0.9 % IR SOLN
Status: DC | PRN
  Administered 2013-03-30: 11:00:00

## 2013-03-30 MED ORDER — LACTATED RINGERS IV SOLN: INTRAVENOUS | Status: DC

## 2013-03-30 SURGICAL SUPPLY — 56 items
BAG DECANTER FOR FLEXI CONT (MISCELLANEOUS) ×2 IMPLANT
BANDAGE GAUZE ELAST BULKY 4 IN (GAUZE/BANDAGES/DRESSINGS) ×4 IMPLANT
BENZOIN TINCTURE PRP APPL 2/3 (GAUZE/BANDAGES/DRESSINGS) ×2 IMPLANT
BIT DRILL 14MM (INSTRUMENTS) ×1 IMPLANT
BUR MATCHSTICK NEURO 3.0 LAGG (BURR) ×2 IMPLANT
CANISTER SUCTION 2500CC (MISCELLANEOUS) ×2 IMPLANT
CLOTH BEACON ORANGE TIMEOUT ST (SAFETY) ×2 IMPLANT
CONT SPEC 4OZ CLIKSEAL STRL BL (MISCELLANEOUS) ×2 IMPLANT
DRAPE LAPAROTOMY 100X72 PEDS (DRAPES) ×2 IMPLANT
DRAPE MICROSCOPE LEICA (MISCELLANEOUS) ×2 IMPLANT
DRAPE POUCH INSTRU U-SHP 10X18 (DRAPES) ×2 IMPLANT
DRILL 14MM (INSTRUMENTS) ×2
DURAPREP 6ML APPLICATOR 50/CS (WOUND CARE) ×2 IMPLANT
ELECT REM PT RETURN 9FT ADLT (ELECTROSURGICAL) ×2
ELECTRODE REM PT RTRN 9FT ADLT (ELECTROSURGICAL) ×1 IMPLANT
GAUZE SPONGE 4X4 16PLY XRAY LF (GAUZE/BANDAGES/DRESSINGS) IMPLANT
GLOVE BIO SURGEON STRL SZ 6.5 (GLOVE) ×2 IMPLANT
GLOVE BIO SURGEON STRL SZ8 (GLOVE) ×2 IMPLANT
GLOVE ECLIPSE 7.5 STRL STRAW (GLOVE) ×2 IMPLANT
GLOVE EXAM NITRILE LRG STRL (GLOVE) IMPLANT
GLOVE EXAM NITRILE MD LF STRL (GLOVE) IMPLANT
GLOVE EXAM NITRILE XL STR (GLOVE) IMPLANT
GLOVE EXAM NITRILE XS STR PU (GLOVE) IMPLANT
GLOVE INDICATOR 8.5 STRL (GLOVE) ×2 IMPLANT
GLOVE SS BIOGEL STRL SZ 6.5 (GLOVE) ×1 IMPLANT
GLOVE SUPERSENSE BIOGEL SZ 6.5 (GLOVE) ×1
GOWN BRE IMP SLV AUR LG STRL (GOWN DISPOSABLE) ×2 IMPLANT
GOWN BRE IMP SLV AUR XL STRL (GOWN DISPOSABLE) IMPLANT
GOWN STRL REIN 2XL LVL4 (GOWN DISPOSABLE) IMPLANT
HEAD HALTER (SOFTGOODS) ×2 IMPLANT
KIT BASIN OR (CUSTOM PROCEDURE TRAY) ×2 IMPLANT
KIT ROOM TURNOVER OR (KITS) ×2 IMPLANT
NEEDLE HYPO 22GX1.5 SAFETY (NEEDLE) IMPLANT
NEEDLE HYPO 25X1 1.5 SAFETY (NEEDLE) ×2 IMPLANT
NEEDLE SPNL 20GX3.5 QUINCKE YW (NEEDLE) ×2 IMPLANT
NS IRRIG 1000ML POUR BTL (IV SOLUTION) ×2 IMPLANT
PACK LAMINECTOMY NEURO (CUSTOM PROCEDURE TRAY) ×2 IMPLANT
PAD ARMBOARD 7.5X6 YLW CONV (MISCELLANEOUS) ×6 IMPLANT
PATTIES SURGICAL .75X.75 (GAUZE/BANDAGES/DRESSINGS) ×2 IMPLANT
PIN DISTRACTION 14MM (PIN) ×4 IMPLANT
PLATE 28MM (Plate) ×2 IMPLANT
RUBBERBAND STERILE (MISCELLANEOUS) ×4 IMPLANT
SCREW 14MM (Screw) ×16 IMPLANT
SLEEVE SURGEON STRL (DRAPES) ×2 IMPLANT
SPACER ASSEM CERV LORD 6M (Spacer) ×2 IMPLANT
SPACER ASSEM CERV LORD 7M (Spacer) ×2 IMPLANT
SPONGE GAUZE 4X4 12PLY (GAUZE/BANDAGES/DRESSINGS) ×2 IMPLANT
SPONGE INTESTINAL PEANUT (DISPOSABLE) ×2 IMPLANT
STRIP CLOSURE SKIN 1/2X4 (GAUZE/BANDAGES/DRESSINGS) ×2 IMPLANT
SUT VIC AB 3-0 SH 8-18 (SUTURE) ×2 IMPLANT
SYR 20ML ECCENTRIC (SYRINGE) ×2 IMPLANT
TAPE CLOTH SURG 4X10 WHT LF (GAUZE/BANDAGES/DRESSINGS) ×2 IMPLANT
TAPE STRIPS DRAPE STRL (GAUZE/BANDAGES/DRESSINGS) ×2 IMPLANT
TOWEL OR 17X24 6PK STRL BLUE (TOWEL DISPOSABLE) ×2 IMPLANT
TOWEL OR 17X26 10 PK STRL BLUE (TOWEL DISPOSABLE) ×2 IMPLANT
WATER STERILE IRR 1000ML POUR (IV SOLUTION) ×2 IMPLANT

## 2013-03-30 NOTE — Progress Notes (Signed)
ANTIBIOTIC CONSULT NOTE - INITIAL  Pharmacy Consult for Vancomycin Indication: post-spinal surgery  Allergies  Allergen Reactions  . Ampicillin Nausea Only  . Flomax (Tamsulosin Hcl) Nausea And Vomiting    Patient Measurements:   Weight 104.2 kg  Vital Signs: Temp: 98.6 F (37 C) (05/19 1523) Temp src: Oral (05/19 0708) BP: 151/73 mmHg (05/19 1523) Pulse Rate: 76 (05/19 1523) Intake/Output from previous day:   Intake/Output from this shift: Total I/O In: 1100 [I.V.:1100] Out: 100 [Blood:100]  Labs: No results found for this basename: WBC, HGB, PLT, LABCREA, CREATININE,  in the last 72 hours The CrCl is unknown because both a height and weight (above a minimum accepted value) are required for this calculation. No results found for this basename: VANCOTROUGH, VANCOPEAK, VANCORANDOM, GENTTROUGH, GENTPEAK, GENTRANDOM, TOBRATROUGH, TOBRAPEAK, TOBRARND, AMIKACINPEAK, AMIKACINTROU, AMIKACIN,  in the last 72 hours   SCr 1.04  Microbiology: Recent Results (from the past 720 hour(s))  SURGICAL PCR SCREEN     Status: None   Collection Time    03/27/13 10:10 AM      Result Value Range Status   MRSA, PCR NEGATIVE  NEGATIVE Final   Staphylococcus aureus NEGATIVE  NEGATIVE Final   Comment:            The Xpert SA Assay (FDA     approved for NASAL specimens     in patients over 69 years of age),     is one component of     a comprehensive surveillance     program.  Test performance has     been validated by [employer] for patients greater     than or equal to 68 year old.     It is not intended     to diagnose infection nor to     guide or monitor treatment.    Medical History: Past Medical History  Diagnosis Date  . Obesity   . HTN (hypertension)   . BPH (benign prostatic hypertrophy)   . Seizures     AS A CHILD.  [name] Ulcer   . Sleep apnea     sleep study, produced known sleep apnea, pt. has not gotten device yet, wants to get his neck fixed 1st   . Hearing  deficit     wears hearing aids bilateral  . GERD (gastroesophageal reflux disease)     reports for indigestion he uses mustard   . Arthritis     hands, knees, cervical area  . Essential tremor     Medications:  Anti-infectives   Start     Dose/Rate Route Frequency Ordered Stop   03/30/13 2200  vancomycin (VANCOCIN) IVPB 1000 mg/200 mL premix     1,000 mg 200 mL/hr over 60 Minutes Intravenous  Once 03/30/13 1539     03/30/13 1100  bacitracin 50,000 Units in sodium chloride irrigation 0.9 % 500 mL irrigation  Status:  Discontinued       As needed 03/30/13 1100 03/30/13 1304   03/30/13 0941  bacitracin 56213 UNITS injection    Comments:  STEELMAN, CRAIG: cabinet override      03/30/13 0941 03/30/13 2144   03/30/13 0600  vancomycin (VANCOCIN) 1,500 mg in sodium chloride 0.9 % 500 mL IVPB     1,500 mg 250 mL/hr over 120 Minutes Intravenous On call to O.R. 03/29/13 1239 03/30/13 1007     Assessment: 67 year old male s/p cervical spine surgery.  He received Vancomycin pre-operatively due to his  ampicillin allergy and is to receive 1 dose post-operatively.  Plan:  Vancomycin 1gm IV x 1 dose Thank you for the consult.  Pharmacy will sign off.  Estella Husk, Pharm.D., BCPS Clinical Pharmacist Phone: [phone] or [phone] Pager: [phone] 03/30/2013, 3:41 PM

## 2013-03-30 NOTE — Interval H&P Note (Signed)
History and Physical Interval Note:  03/30/2013 9:45 AM  Bryan [name]  has presented today for surgery, with the diagnosis of Cervical radiculopathy, Cervical spondylosis, Cervical herniated disc, Cervical stenosis  The various methods of treatment have been discussed with the patient and family. After consideration of risks, benefits and other options for treatment, the patient has consented to  Procedure(s) with comments: ANTERIOR CERVICAL DECOMPRESSION/DISCECTOMY FUSION 2 LEVELS (N/A) - C4-5 C5-6 Anterior cervical decompression/diskectomy/fusion/Allograft/Plate as a surgical intervention .  The patient's history has been reviewed, patient examined, no change in status, stable for surgery.  I have reviewed the patient's chart and labs.  Questions were answered to the patient's satisfaction.     [name]

## 2013-03-30 NOTE — Anesthesia Procedure Notes (Signed)
Procedure Name: Intubation Date/Time: 03/30/2013 10:19 AM Performed by: Charm Barges, Zacharey Jensen R Pre-anesthesia Checklist: Patient identified, Emergency Drugs available, Suction available, Patient being monitored and Timeout performed Patient Re-evaluated:Patient Re-evaluated prior to inductionOxygen Delivery Method: Circle system utilized Preoxygenation: Pre-oxygenation with 100% oxygen Intubation Type: IV induction Ventilation: Mask ventilation without difficulty Laryngoscope Size: Mac and 4 Grade View: Grade II Tube type: Oral Tube size: 8.0 mm Number of attempts: 1 Airway Equipment and Method: Stylet Placement Confirmation: ETT inserted through vocal cords under direct vision,  positive ETCO2 and breath sounds checked- equal and bilateral Secured at: 21 cm Tube secured with: Tape Dental Injury: Teeth and Oropharynx as per pre-operative assessment

## 2013-03-30 NOTE — Anesthesia Postprocedure Evaluation (Signed)
  Anesthesia Post-op Note  Patient: Bryan [name]  Procedure(s) Performed: Procedure(s) with comments: ANTERIOR CERVICAL DECOMPRESSION/DISCECTOMY FUSION 2 LEVELS (N/A) - C4-5 C5-6 Anterior cervical decompression/diskectomy/fusion/Allograft/Plate  Patient Location: PACU  Anesthesia Type:General  Level of Consciousness: awake, alert  and oriented  Airway and Oxygen Therapy: Patient Spontanous Breathing and Patient connected to nasal cannula oxygen  Post-op Pain: none  Post-op Assessment: Post-op Vital signs reviewed, Patient's Cardiovascular Status Stable, Respiratory Function Stable, Patent Airway and Pain level controlled  Post-op Vital Signs: stable  Complications: No apparent anesthesia complications

## 2013-03-30 NOTE — Preoperative (Signed)
Beta Blockers   Reason not to administer Beta Blockers:Not Applicable 

## 2013-03-30 NOTE — Plan of Care (Signed)
Problem: Consults Goal: Diagnosis - Spinal Surgery Outcome: Completed/Met Date Met:  03/30/13 Cervical Spine Fusion     

## 2013-03-30 NOTE — Transfer of Care (Signed)
Immediate Anesthesia Transfer of Care Note  Patient: Bryan [name]  Procedure(s) Performed: Procedure(s) with comments: ANTERIOR CERVICAL DECOMPRESSION/DISCECTOMY FUSION 2 LEVELS (N/A) - C4-5 C5-6 Anterior cervical decompression/diskectomy/fusion/Allograft/Plate  Patient Location: PACU  Anesthesia Type:General  Level of Consciousness: awake, alert  and oriented  Airway & Oxygen Therapy: Patient Spontanous Breathing and Patient connected to nasal cannula oxygen  Post-op Assessment: Report given to PACU RN, Post -op Vital signs reviewed and stable and Patient moving all extremities X 4  Post vital signs: Reviewed and stable  Complications: No apparent anesthesia complications

## 2013-03-30 NOTE — Progress Notes (Signed)
UR COMPLETED  

## 2013-03-30 NOTE — H&P (Signed)
See H& P.

## 2013-03-30 NOTE — Op Note (Signed)
03/30/2013  1:02 PM  PATIENT:  Bryan [name]  67 y.o. male  PRE-OPERATIVE DIAGNOSIS:  Cervical radiculopathy, Cervical spondylosis, Cervical herniated disc, Cervical stenosis , C4-5, C5-6  POST-OPERATIVE DIAGNOSIS:  Cervical radiculopathy, Cervical spondylosis, Cervical herniated disc, Cervical stenosis,  C4-5, C5-6  PROCEDURE:  Procedure(s): ANTERIOR CERVICAL DECOMPRESSION/DISCECTOMY FUSION 2 LEVELS,  C4-5, C5-6,   Structural allograft  - trestle plate  SURGEON:  Surgeon(s): Clydene Fake, MD Mariam Dollar, MD-assist    ANESTHESIA:   general  EBL:  Total I/O In: 1000 [I.V.:1000] Out: -   BLOOD ADMINISTERED:none  DRAINS: none   SPECIMEN:  No Specimen  DICTATION: Patient is 67 year old gentleman in and right arm pain numbness and weakness. Workup included an MRI showing severe spinal change at 4563) measuring 4 by bilaterally at 56 worst left and some stenosis with disc and osteophytes and flattening the anterior cord patient brought in for decompression fusion at these levels.  Patient brought in the operating room general anesthesia induced patient placed in 10 pounds halter traction prepped and draped in a sterile fashion. Slight incision injected with 10 cc 1% lidocaine with epinephrine. Incision was then made from the midline to the intracerebral her the sternocleidomastoid muscle the left-sided neck incision taken of the platysma hemostasis obtained with Bovie cauterization platysma was incised and blunt dissection taken to the intracerebral fascia to the intracerebral spine. 2 needles were placed and disc spaces and x-rays attention needle for the 4556 spaces. The spaces were incised the knee was removed and partial discectomy done with pituitary rongeurs. Lungs: Muscles reflected laterally using the Bovie and soaking retractors were placed we could see the 4556 levels distraction pins were placed the C4 and C6 interspaces distracted starting provide discectomy continued by  removing and dressed right) discectomy with pituitary rongeurs and curettes and then went to more Kerrison punches were used to posterior discussed ligament decompressed the central canal and performing bilateral foraminotomies. High-speed drill was used to remove callus endplate measured height a displaced to be 7 mm a 7 mm structural allograft bone was tapped in place. It hemostasis with Gelfoam thrombin this then irrigated out. Attention then taken 56 level again contrast which removed and the and discectomy at that continued with pituitary rongeurs and curettes used high-speed drill to the discectomy remove callus endplate remove posterior osteophytes. Went to more Kerrison punches were used to complete the decompression decompress the central canal and bilateral foramina. Get hemostasis with Gelfoam thrombin was irrigated out we did hemostasis the interspace was measured to be 6 mm the 6 mm structural allograft bone was tapped in place. Distraction distraction pins removed hemostasis obtained with Gelfoam thrombin weight was removed the traction a bone plugs were in good firm position and trestle anterior cervical plate was placed with anterior surgical spine and 2 screws placed the C4 to C5-C6 these were tightened down lateral x-rays obtained showing good position plate-screw bone plugs at the 4556 level. Retractors removed we. About solution we very good hemostasis the platysma was closed through Vicryl interrupted sutures subcutaneous tissue closed the same skin closed benzoin Steri-Strips dressing was placed Sulzer collar woken from anesthesia and transferred recovery  PLAN OF CARE: Admit for overnight observation  PATIENT DISPOSITION:  PACU - hemodynamically stable.

## 2013-03-30 NOTE — Anesthesia Preprocedure Evaluation (Signed)
Anesthesia Evaluation  Patient identified by MRN, date of birth, ID band Patient awake    Reviewed: Allergy & Precautions, H&P , NPO status , Patient's Chart, lab work & pertinent test results  Airway Mallampati: II      Dental  (+) Teeth Intact and Dental Advisory Given   Pulmonary  breath sounds clear to auscultation        Cardiovascular Rhythm:Regular Rate:Normal     Neuro/Psych    GI/Hepatic   Endo/Other    Renal/GU      Musculoskeletal   Abdominal (+) + obese,   Peds  Hematology   Anesthesia Other Findings   Reproductive/Obstetrics                           Anesthesia Physical Anesthesia Plan  ASA: III  Anesthesia Plan: General   Post-op Pain Management:    Induction: Intravenous  Airway Management Planned: Oral ETT  Additional Equipment:   Intra-op Plan:   Post-operative Plan: Extubation in OR  Informed Consent: I have reviewed the patients History and Physical, chart, labs and discussed the procedure including the risks, benefits and alternatives for the proposed anesthesia with the patient or authorized representative who has indicated his/her understanding and acceptance.   Dental advisory given  Plan Discussed with: CRNA and Anesthesiologist  Anesthesia Plan Comments: (HNP C4-5, C5-6 Htn Obesity Refuses blood products  Plan GA wit oral ETT  Kipp Brood, MD)        Anesthesia Quick Evaluation

## 2013-03-31 ENCOUNTER — Encounter (HOSPITAL_COMMUNITY): Payer: Self-pay | Admitting: Neurosurgery

## 2013-03-31 MED ORDER — CYCLOBENZAPRINE HCL 10 MG PO TABS: 10.0000 mg | ORAL_TABLET | Freq: Three times a day (TID) | ORAL | Status: DC | PRN

## 2013-03-31 MED ORDER — HYDROCODONE-ACETAMINOPHEN 5-325 MG PO TABS: 1.0000 | ORAL_TABLET | ORAL | Status: DC | PRN

## 2013-03-31 NOTE — Progress Notes (Signed)
Pt. discharged home accompanied by spouse. Prescriptions and discharge instructions given with verbalization of understanding. Incision site on neck with no s/s of infection - no swelling, redness, bleeding, and/or drainage noted. Soft collar intact. Opportunity given to ask questions but no question asked. Pt. transported out of this unit in wheelchair by the nurse tech.

## 2013-03-31 NOTE — Discharge Summary (Signed)
Physician Discharge Summary  Patient ID: Bryan [name] MRN: [id number] DOB/AGE: 69/09/1946 69 y.o.  Admit date: 03/30/2013 Discharge date: 03/31/2013  Admission Diagnoses:Cervical radiculopathy, Cervical spondylosis, Cervical herniated disc, Cervical stenosis , C4-5, C5-6   Discharge Diagnoses: Cervical radiculopathy, Cervical spondylosis, Cervical herniated disc, Cervical stenosis , C4-5, C5-6  Active Problems:   * No active hospital problems. *   Discharged Condition: good  Hospital Course: pt admitted on day of surgery   - underwent procedure below  - pt eating, ambulating, voice ok.   -  Improved arm/shoulder strength    Treatments: surgery: ANTERIOR CERVICAL DECOMPRESSION/DISCECTOMY FUSION 2 LEVELS, C4-5, C5-6, Structural allograft - trestle plate   Discharge Exam: Blood pressure 105/64, pulse 78, temperature 97.9 F (36.6 C), temperature source Oral, resp. rate 18, SpO2 93.00%. Wound:c/d/i  Disposition: home     Medication List    STOP taking these medications       meloxicam 15 MG tablet  Commonly known as:  MOBIC      TAKE these medications       acetaminophen 500 MG tablet  Commonly known as:  TYLENOL  Take 500 mg by mouth every 6 (six) hours as needed for pain.     aspirin 81 MG tablet  Take 81 mg by mouth daily. Last dose taken 5/11//2014 in anticipation of surgery     cyclobenzaprine 10 MG tablet  Commonly known as:  FLEXERIL  Take 1 tablet (10 mg total) by mouth 3 (three) times daily as needed for muscle spasms.     gabapentin 100 MG capsule  Commonly known as:  NEURONTIN  Take 200-300 mg by mouth 2 (two) times daily. Takes 2 capsules in am and 3 capsules at night     HYDROcodone-acetaminophen 5-325 MG per tablet  Commonly known as:  NORCO/VICODIN  Take 1-2 tablets by mouth every 4 (four) hours as needed.     losartan-hydrochlorothiazide 50-12.5 MG per tablet  Commonly known as:  HYZAAR  Take 1 tablet by mouth daily.     primidone 50 MG  tablet  Commonly known as:  MYSOLINE  Take 50 mg by mouth at bedtime. Takes 2 po qhs     tamsulosin 0.4 MG Caps  Commonly known as:  FLOMAX  Take 0.4 mg by mouth daily.         SignedClydene Fake, MD 03/31/2013, 7:40 AM

## 2013-04-05 ENCOUNTER — Ambulatory Visit (INDEPENDENT_AMBULATORY_CARE_PROVIDER_SITE_OTHER): Payer: Medicare Other | Admitting: Emergency Medicine

## 2013-04-05 ENCOUNTER — Ambulatory Visit: Payer: Medicare Other

## 2013-04-05 VITALS — BP 122/58 | HR 105 | Temp 98.1°F | Resp 20 | Ht 64.5 in | Wt 223.6 lb

## 2013-04-05 DIAGNOSIS — F439 Reaction to severe stress, unspecified: Secondary | ICD-10-CM

## 2013-04-05 DIAGNOSIS — R05 Cough: Secondary | ICD-10-CM

## 2013-04-05 DIAGNOSIS — R059 Cough, unspecified: Secondary | ICD-10-CM

## 2013-04-05 DIAGNOSIS — R251 Tremor, unspecified: Secondary | ICD-10-CM

## 2013-04-05 DIAGNOSIS — R079 Chest pain, unspecified: Secondary | ICD-10-CM

## 2013-04-05 DIAGNOSIS — R259 Unspecified abnormal involuntary movements: Secondary | ICD-10-CM

## 2013-04-05 DIAGNOSIS — Z733 Stress, not elsewhere classified: Secondary | ICD-10-CM

## 2013-04-05 MED ORDER — ALPRAZOLAM 0.5 MG PO TABS: ORAL_TABLET | ORAL | Status: DC

## 2013-04-05 MED ORDER — PRIMIDONE 50 MG PO TABS: 50.0000 mg | ORAL_TABLET | Freq: Every day | ORAL | Status: DC

## 2013-04-05 NOTE — Progress Notes (Signed)
  Subjective:    Patient ID: Bryan [name], male    DOB: [birth year], 67 y.o.   MRN: [id number]  HPI  67 y/o male c/o Productive cough, white sputum., Decreased sleep, due to breathing. Laying down causes a gurgling feeling.       Surgery last Monday 03/30/13 on neck. Experiencing hallucinations at night with pain medications pt discontinued pain meds, hallucinations subsided.he continues to have a bad sore throat and a feeling of congestion in his chest   Review of Systems     Objective:   Physical Exam patient is alert and cooperative. His neck is supple. Chest exam is clear. His cardiac exam reveals a prominent S1 and S2 with irregular heart rhythm.  There is a fairly large bruise over his left upper anterior chest which is tender to palpation. The abdomen itself is soft and nontender.  EKG normal sinus rhythm right bundle branch block with occasional PACs.[facility] reading (PRIMARY) by  Dr. Cleta Alberts I did not see any acute infiltrates there is mildly increased basilar markings      Assessment & Plan:  Patient is under a lot of stress unable to rest. will treat with Xanax 0.5 a half to one every 6 hours as needed

## 2013-04-06 ENCOUNTER — Emergency Department (HOSPITAL_COMMUNITY): Payer: BC Managed Care – PPO

## 2013-04-06 ENCOUNTER — Telehealth: Payer: Self-pay

## 2013-04-06 ENCOUNTER — Emergency Department (HOSPITAL_COMMUNITY)
Admission: EM | Admit: 2013-04-06 | Discharge: 2013-04-06 | Disposition: A | Discharge status: Home / Self Care | Payer: BC Managed Care – PPO | Attending: Emergency Medicine | Admitting: Emergency Medicine

## 2013-04-06 ENCOUNTER — Encounter (HOSPITAL_COMMUNITY): Payer: Self-pay | Admitting: Emergency Medicine

## 2013-04-06 DIAGNOSIS — E669 Obesity, unspecified: Secondary | ICD-10-CM | POA: Insufficient documentation

## 2013-04-06 DIAGNOSIS — S20212A Contusion of left front wall of thorax, initial encounter: Secondary | ICD-10-CM

## 2013-04-06 DIAGNOSIS — S6990XA Unspecified injury of unspecified wrist, hand and finger(s), initial encounter: Secondary | ICD-10-CM | POA: Insufficient documentation

## 2013-04-06 DIAGNOSIS — H9190 Unspecified hearing loss, unspecified ear: Secondary | ICD-10-CM | POA: Insufficient documentation

## 2013-04-06 DIAGNOSIS — N400 Enlarged prostate without lower urinary tract symptoms: Secondary | ICD-10-CM | POA: Insufficient documentation

## 2013-04-06 DIAGNOSIS — Z8739 Personal history of other diseases of the musculoskeletal system and connective tissue: Secondary | ICD-10-CM | POA: Insufficient documentation

## 2013-04-06 DIAGNOSIS — Z7982 Long term (current) use of aspirin: Secondary | ICD-10-CM | POA: Insufficient documentation

## 2013-04-06 DIAGNOSIS — Z87891 Personal history of nicotine dependence: Secondary | ICD-10-CM | POA: Insufficient documentation

## 2013-04-06 DIAGNOSIS — Z8669 Personal history of other diseases of the nervous system and sense organs: Secondary | ICD-10-CM | POA: Insufficient documentation

## 2013-04-06 DIAGNOSIS — Z8719 Personal history of other diseases of the digestive system: Secondary | ICD-10-CM | POA: Insufficient documentation

## 2013-04-06 DIAGNOSIS — Y92410 Unspecified street and highway as the place of occurrence of the external cause: Secondary | ICD-10-CM | POA: Insufficient documentation

## 2013-04-06 DIAGNOSIS — I10 Essential (primary) hypertension: Secondary | ICD-10-CM | POA: Insufficient documentation

## 2013-04-06 DIAGNOSIS — S20219A Contusion of unspecified front wall of thorax, initial encounter: Secondary | ICD-10-CM | POA: Insufficient documentation

## 2013-04-06 DIAGNOSIS — S6992XA Unspecified injury of left wrist, hand and finger(s), initial encounter: Secondary | ICD-10-CM

## 2013-04-06 DIAGNOSIS — Z872 Personal history of diseases of the skin and subcutaneous tissue: Secondary | ICD-10-CM | POA: Insufficient documentation

## 2013-04-06 DIAGNOSIS — R569 Unspecified convulsions: Secondary | ICD-10-CM | POA: Insufficient documentation

## 2013-04-06 DIAGNOSIS — Y939 Activity, unspecified: Secondary | ICD-10-CM | POA: Insufficient documentation

## 2013-04-06 LAB — POCT I-STAT, CHEM 8
BUN: 24 mg/dL — ABNORMAL HIGH (ref 6–23)
Calcium, Ion: 1.15 mmol/L (ref 1.13–1.30)
Chloride: 101 mEq/L (ref 96–112)
Creatinine, Ser: 1.3 mg/dL (ref 0.50–1.35)
Sodium: 139 mEq/L (ref 135–145)
TCO2: 33 mmol/L (ref 0–100)

## 2013-04-06 LAB — SAMPLE TO BLOOD BANK

## 2013-04-06 LAB — COMPREHENSIVE METABOLIC PANEL
ALT: 25 U/L (ref 0–53)
Albumin: 3.8 g/dL (ref 3.5–5.2)
Alkaline Phosphatase: 154 U/L — ABNORMAL HIGH (ref 39–117)
BUN: 22 mg/dL (ref 6–23)
Potassium: 5 mEq/L (ref 3.5–5.1)
Sodium: 137 mEq/L (ref 135–145)
Total Protein: 8.6 g/dL — ABNORMAL HIGH (ref 6.0–8.3)

## 2013-04-06 LAB — CBC
HCT: 40.7 % (ref 39.0–52.0)
MCV: 90.4 fL (ref 78.0–100.0)
RDW: 13.8 % (ref 11.5–15.5)
WBC: 10.8 10*3/uL — ABNORMAL HIGH (ref 4.0–10.5)

## 2013-04-06 MED ORDER — IBUPROFEN 800 MG PO TABS: 800.0000 mg | ORAL_TABLET | Freq: Three times a day (TID) | ORAL | Status: DC

## 2013-04-06 MED ORDER — IOHEXOL 300 MG/ML  SOLN
100.0000 mL | Freq: Once | INTRAMUSCULAR | Status: AC | PRN
  Administered 2013-04-06: 100 mL via INTRAVENOUS

## 2013-04-06 MED ORDER — FENTANYL CITRATE 0.05 MG/ML IJ SOLN
50.0000 ug | Freq: Once | INTRAMUSCULAR | Status: AC
  Administered 2013-04-06: 50 ug via INTRAVENOUS
  Filled 2013-04-06: qty 2

## 2013-04-06 MED ORDER — OXYCODONE-ACETAMINOPHEN 5-325 MG PO TABS: 2.0000 | ORAL_TABLET | ORAL | Status: DC | PRN
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
## 2013-04-06 NOTE — Telephone Encounter (Signed)
Pharmacy requests Rx for Meloxicam which Dr Cleta Alberts has not written before. Dr Cleta Alberts, do you want to rx this for Pt? Meloxicam 15 mg, 1 tab QD after meal. #90.
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
## 2013-04-06 NOTE — Progress Notes (Signed)
Orthopedic Tech Progress Note Patient Details:  Bryan [name] 04-29-1946 [id number]  Ortho Devices Type of Ortho Device: Thumb velcro splint Ortho Device/Splint Location: LUE Ortho Device/Splint Interventions: Ordered;Application   Jennye Moccasin 04/06/2013, 10:48 PM

## 2013-04-06 NOTE — ED Notes (Signed)
Patient ambulated and tolerated PO without any difficulty. Ortho tech at bedside to apply left thumb spica

## 2013-04-06 NOTE — ED Notes (Signed)
Patient is completely undressed with gown on

## 2013-04-06 NOTE — ED Notes (Signed)
Called phlebotomy to inquire about ETA for ordered labs to be drawn

## 2013-04-07 NOTE — Telephone Encounter (Signed)
Pt advised.

## 2013-04-07 NOTE — Telephone Encounter (Signed)
He does not need to be on the meloxicam at the present time. This is not recommended following surgery. He needs to speak with Dr. Phoebe Perch about when Dr. Phoebe Perch feels it would  be safe for him to restart this Medication

## 2013-04-27 DIAGNOSIS — M47812 Spondylosis without myelopathy or radiculopathy, cervical region: Secondary | ICD-10-CM | POA: Insufficient documentation

## 2013-05-13 ENCOUNTER — Ambulatory Visit (INDEPENDENT_AMBULATORY_CARE_PROVIDER_SITE_OTHER): Payer: Medicare Other | Admitting: Family Medicine

## 2013-05-13 VITALS — BP 140/68 | HR 87 | Temp 98.2°F | Resp 18 | Ht 65.0 in | Wt 228.0 lb

## 2013-05-13 DIAGNOSIS — R490 Dysphonia: Secondary | ICD-10-CM

## 2013-05-13 DIAGNOSIS — M940 Chondrocostal junction syndrome [Tietze]: Secondary | ICD-10-CM

## 2013-05-13 DIAGNOSIS — R071 Chest pain on breathing: Secondary | ICD-10-CM

## 2013-05-13 DIAGNOSIS — R0789 Other chest pain: Secondary | ICD-10-CM

## 2013-05-13 MED ORDER — DICLOFENAC SODIUM 75 MG PO TBEC: 75.0000 mg | DELAYED_RELEASE_TABLET | Freq: Two times a day (BID) | ORAL | Status: DC

## 2013-05-13 NOTE — Patient Instructions (Signed)
Take diclofenac one twice daily for inflammation of chest wall. If over the next several weeks this is not improving return for recheck.    Costochondritis Costochondritis (Tietze syndrome), or costochondral separation, is a swelling and irritation (inflammation) of the tissue (cartilage) that connects your ribs with your breastbone (sternum). It may occur on its own (spontaneously), through damage caused by an accident (trauma), or simply from coughing or minor exercise. It may take up to 6 weeks to get better and longer if you are unable to be conservative in your activities. HOME CARE INSTRUCTIONS   Avoid exhausting physical activity. Try not to strain your ribs during normal activity. This would include any activities using chest, belly (abdominal), and side muscles, especially if heavy weights are used.  Use ice for 15-20 minutes per hour while awake for the first 2 days. Place the ice in a plastic bag, and place a towel between the bag of ice and your skin.  Only take over-the-counter or prescription medicines for pain, discomfort, or fever as directed by your caregiver. SEEK IMMEDIATE MEDICAL CARE IF:   Your pain increases or you are very uncomfortable.  You have a fever.  You develop difficulty with your breathing.  You cough up blood.  You develop worse chest pains, shortness of breath, sweating, or vomiting.  You develop new, unexplained problems (symptoms). MAKE SURE YOU:   Understand these instructions.  Will watch your condition.  Will get help right away if you are not doing well or get worse. Document Released: 08/08/2005 Document Revised: 01/21/2012 Document Reviewed: 06/16/2008 [facility] Patient Information 2014 [place], [place].     If the raspy voice continues, we can see you to an ear nose and throat doctor to get it checked. However your less than 2 months postoperative at this time, and I believe that it will gradually continued to improve on its own.
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
## 2013-05-29 ENCOUNTER — Ambulatory Visit: Payer: Medicare Other

## 2013-05-29 ENCOUNTER — Ambulatory Visit (INDEPENDENT_AMBULATORY_CARE_PROVIDER_SITE_OTHER): Payer: Medicare Other | Admitting: Family Medicine

## 2013-05-29 VITALS — BP 132/82 | HR 95 | Temp 98.0°F | Resp 17 | Ht 64.5 in | Wt 230.0 lb

## 2013-05-29 DIAGNOSIS — M25559 Pain in unspecified hip: Secondary | ICD-10-CM

## 2013-05-29 DIAGNOSIS — M25551 Pain in right hip: Secondary | ICD-10-CM

## 2013-05-29 DIAGNOSIS — I499 Cardiac arrhythmia, unspecified: Secondary | ICD-10-CM

## 2013-05-29 MED ORDER — METHOCARBAMOL 500 MG PO TABS: 500.0000 mg | ORAL_TABLET | Freq: Four times a day (QID) | ORAL | Status: DC

## 2013-05-29 NOTE — Progress Notes (Signed)
[facility] [address] [place] [address] [phone]  Date:  05/29/2013   Name:  Bryan [name]   DOB:  [birth year]   MRN:  [id number]  PCP:  Abbe Amsterdam, MD    Chief Complaint: Hip Pain   History of Present Illness:  Bryan [name] is a 67 y.o. very pleasant male patient who presents with the following:  He has noted pain in his right hip- it is "like a toothache."  It started yesterday. He is using ibuprofen.  He did not fall or have any other known injury.   No recent unusual activity or increased exercise,  He does walk some but "not enough to call it exercise."   He recently had some neck surgery- he had a hard time with anxiety after the surgery but is now doing better  Patient Active Problem List   Diagnosis Date Noted  . Rheumatoid factor positive 10/26/2012  . Nonspecific abnormal finding in stool contents 07/28/2012  . Multiple joint pain 06/05/2012  . History of tobacco use-  06/05/2012  . Asthmatic bronchitis 12/15/2011  . BPH (benign prostatic hyperplasia) 12/15/2011  . Essential tremor 12/15/2011  . HTN (hypertension) 12/15/2011    Past Medical History  Diagnosis Date  . Obesity   . HTN (hypertension)   . BPH (benign prostatic hypertrophy)   . Seizures     AS A CHILD.  [name] Ulcer   . Sleep apnea     sleep study, produced known sleep apnea, pt. has not gotten device yet, wants to get his neck fixed 1st   . Hearing deficit     wears hearing aids bilateral  . GERD (gastroesophageal reflux disease)     reports for indigestion he uses mustard   . Arthritis     hands, knees, cervical area  . Essential tremor   . Anxiety     Past Surgical History  Procedure Laterality Date  . Tonsillectomy    . Eye surgery      right, growth excision  . Colonoscopy    . Vasectomy    . Anterior cervical decomp/discectomy fusion N/A 03/30/2013    Procedure: ANTERIOR CERVICAL DECOMPRESSION/DISCECTOMY FUSION 2 LEVELS;  Surgeon: Clydene Fake,  MD;  Location: [facility];  Service: Neurosurgery;  Laterality: N/A;  C4-5 C5-6 Anterior cervical decompression/diskectomy/fusion/Allograft/Plate  . Spine surgery      History  Substance Use Topics  . Smoking status: Former Smoker    Types: Cigarettes    Quit date: 12/14/1972  . Smokeless tobacco: Never Used  . Alcohol Use: Yes     Comment: social    Family History  Problem Relation Age of Onset  . Colon cancer Maternal Grandfather   . Diabetes Paternal Aunt     x 4 aunts  . Kidney disease Brother   . Stroke Maternal Grandmother     Allergies  Allergen Reactions  . Ampicillin Nausea Only  . Hydrocodone Other (See Comments)    Hallucinations     Medication list has been reviewed and updated.  Current Outpatient Prescriptions on File Prior to Visit  Medication Sig Dispense Refill  . acetaminophen (TYLENOL) 500 MG tablet Take 500 mg by mouth every 6 (six) hours as needed for pain.      [name] ALPRAZolam (XANAX) 0.5 MG tablet Take 0.25-0.5 mg by mouth 3 (three) times daily as needed. for stress or anxiety      . aspirin 81 MG tablet Take 81 mg by  mouth daily. Last dose taken 5/11//2014 in anticipation of surgery      . diclofenac (VOLTAREN) 75 MG EC tablet Take 1 tablet (75 mg total) by mouth 2 (two) times daily.  30 tablet  0  . gabapentin (NEURONTIN) 100 MG capsule Take 200-300 mg by mouth 2 (two) times daily. Takes 2 capsules in am and 3 capsules at night      . ibuprofen (ADVIL,MOTRIN) 800 MG tablet Take 1 tablet (800 mg total) by mouth 3 (three) times daily.  21 tablet  0  . losartan-hydrochlorothiazide (HYZAAR) 50-12.5 MG per tablet Take 1 tablet by mouth daily.  90 tablet  1  . oxyCODONE-acetaminophen (PERCOCET/ROXICET) 5-325 MG per tablet Take 2 tablets by mouth every 4 (four) hours as needed for pain.  15 tablet  0  . primidone (MYSOLINE) 50 MG tablet Take 100 mg by mouth at bedtime.      . Tamsulosin HCl (FLOMAX) 0.4 MG CAPS Take 0.4 mg by mouth at bedtime.        No  current facility-administered medications on file prior to visit.    Review of Systems:  As per HPI- otherwise negative.   Physical Examination: Filed Vitals:   05/29/13 1736  BP: 132/82  Pulse: 95  Temp: 98 F (36.7 C)  Resp: 17   Filed Vitals:   05/29/13 1736  Height: 5' 4.5" (1.638 m)  Weight: 230 lb (104.327 kg)   Body mass index is 38.88 kg/(m^2). Ideal Body Weight: Weight in (lb) to have BMI = 25: 147.6  GEN: WDWN, NAD, Non-toxic, A & O x 3, obese HEENT: Atraumatic, Normocephalic. Neck supple. No masses, No LAD. Ears and Nose: No external deformity. CV: RRR, No M/G/R. No JVD. No thrill. No extra heart sounds. PULM: CTA B, no wheezes, crackles, rhonchi. No retractions. No resp. distress. No accessory muscle use. ABD: S, NT, ND, +BS. No rebound. No HSM. EXTR: No c/c/e NEURO Normal gait.  Back; normal ROM, no tenderness.  Mild tenderness over the right sciatic notch.  Normal leg [name] and sensation  PSYCH: Normally interactive. Conversant. Not depressed or anxious appearing.  Calm demeanor.   [facility] reading (PRIMARY) by  Dr. Patsy Lager. Right hip: negative for fracture.  Mild degenerative change Clinical Data: Right hip pain.  RIGHT HIP - COMPLETE 2+ VIEW  Comparison: Plain film the pelvis 04/06/2013.  Findings: There is no fracture or dislocation. No notable degenerative change. No evidence of avascular necrosis.  IMPRESSION: Negative exam.  Clinically significant discrepancy from primary report, if provided: None  EKG: SR with RBBB, one PAC.  No change from previous EKG  Assessment and Plan: Right hip pain - Plan: DG Hip Complete Right, methocarbamol (ROBAXIN) 500 MG tablet  Irregular heart rate - Plan: EKG 12-Lead  Occasional PAC's stable.  No CP.  Nothing further needed Right hip pain; likely sciatica.  He has some voltaren at home which he can use.  Robaxin as needed for pain as well- caution regarding sedation.  He plans to come in for a CPE asap. .     Signed Abbe Amsterdam, MD

## 2013-05-29 NOTE — Patient Instructions (Addendum)
Use the robaxin as needed, but be careful of sedation.   Come and see me for a physical exam when you can Let me know if your hip pain is not better soon

## 2013-05-31 ENCOUNTER — Ambulatory Visit (INDEPENDENT_AMBULATORY_CARE_PROVIDER_SITE_OTHER): Payer: Medicare Other | Admitting: Family Medicine

## 2013-05-31 VITALS — BP 130/70 | HR 79 | Temp 98.7°F | Resp 16 | Ht 64.0 in | Wt 230.0 lb

## 2013-05-31 DIAGNOSIS — M7071 Other bursitis of hip, right hip: Secondary | ICD-10-CM

## 2013-05-31 DIAGNOSIS — M76899 Other specified enthesopathies of unspecified lower limb, excluding foot: Secondary | ICD-10-CM

## 2013-05-31 MED ORDER — PREDNISONE 20 MG PO TABS: ORAL_TABLET | ORAL | Status: DC

## 2013-05-31 NOTE — Patient Instructions (Addendum)
Hip Bursitis  Bursitis is a swelling and soreness (inflammation) of a fluid-filled sac (bursa). This sac overlies and protects the joints.   CAUSES   · Injury.  · Overuse of the muscles surrounding the joint.  · Arthritis.  · Gout.  · Infection.  · Cold weather.  · Inadequate warm-up and conditioning prior to activities.  The cause may not be known.   SYMPTOMS   · Mild to severe irritation.  · Tenderness and swelling over the outside of the hip.  · Pain with motion of the hip.  · If the bursa becomes infected, a fever may be present. Redness, tenderness, and warmth will develop over the hip.  Symptoms usually lessen in 3 to 4 weeks with treatment, but can come back.  TREATMENT  If conservative treatment does not work, your caregiver may advise draining the bursa and injecting cortisone into the area. This may speed up the healing process. This may also be used as an initial treatment of choice.  HOME CARE INSTRUCTIONS   · Apply ice to the affected area for 15-20 minutes every 3 to 4 hours while awake for the first 2 days. Put the ice in a plastic bag and place a towel between the bag of ice and your skin.  · Rest the painful joint as much as possible, but continue to put the joint through a normal range of motion at least 4 times per day. When the pain lessens, begin normal, slow movements and usual activities to help prevent stiffness of the hip.  · Only take over-the-counter or prescription medicines for pain, discomfort, or fever as directed by your caregiver.  · Use crutches to limit weight bearing on the hip joint, if advised.  · Elevate your painful hip to reduce swelling. Use pillows for propping and cushioning your legs and hips.  · Gentle massage may provide comfort and decrease swelling.  SEEK IMMEDIATE MEDICAL CARE IF:   · Your pain increases even during treatment, or you are not improving.  · You have a fever.  · You have heat and inflammation over the involved bursa.  · You have any other questions or  concerns.  MAKE SURE YOU:   · Understand these instructions.  · Will watch your condition.  · Will get help right away if you are not doing well or get worse.  Document Released: 04/20/2002 Document Revised: 01/21/2012 Document Reviewed: 11/17/2008  [employer]® Patient Information ©2014 [employer].

## 2013-05-31 NOTE — Progress Notes (Signed)
Is a 67 year old retired man comes in with right hip pain. He was seen 2 days ago and started on muscle relaxers but has noted no improvement. He had an x-ray at the time which was normal.  Patient says the pain began about 4 days ago, spontaneously. He's been no increase in walking or exercise and has had no trauma. Patient has no history of gout or other arthritis.  The pain begins when he stands for while or when he rotates the leg at the hip.  Objective: No acute distress Gait normal Elevation of the right greater trochanter reveals significant tenderness. Straight-leg raising is negative Movement of the hip when patient is supine shows the patient has significant pain rotating the hip internally or externally whether the leg is out stretched or whether he flexes it at the knee.  Assessment: Right greater trochanteric bursitis of the hip  Plan: Prednisone I have asked patient call me if not better in 24 or so we can get him in to see orthopedist Hip bursitis, right - Plan: predniSONE (DELTASONE) 20 MG tablet   Signed, Elvina Sidle

## 2013-06-05 ENCOUNTER — Ambulatory Visit (INDEPENDENT_AMBULATORY_CARE_PROVIDER_SITE_OTHER): Payer: Medicare Other | Admitting: Emergency Medicine

## 2013-06-05 VITALS — BP 142/80 | HR 71 | Temp 97.7°F | Resp 18 | Ht 64.0 in | Wt 226.0 lb

## 2013-06-05 DIAGNOSIS — Z0000 Encounter for general adult medical examination without abnormal findings: Secondary | ICD-10-CM

## 2013-06-05 DIAGNOSIS — M76899 Other specified enthesopathies of unspecified lower limb, excluding foot: Secondary | ICD-10-CM

## 2013-06-05 DIAGNOSIS — M7071 Other bursitis of hip, right hip: Secondary | ICD-10-CM

## 2013-06-05 DIAGNOSIS — I10 Essential (primary) hypertension: Secondary | ICD-10-CM

## 2013-06-05 DIAGNOSIS — N429 Disorder of prostate, unspecified: Secondary | ICD-10-CM

## 2013-06-05 DIAGNOSIS — E669 Obesity, unspecified: Secondary | ICD-10-CM

## 2013-06-05 LAB — POCT CBC
MCH, POC: 29.6 pg (ref 27–31.2)
MCHC: 30.7 g/dL — AB (ref 31.8–35.4)
MID (cbc): 0.6 (ref 0–0.9)
MPV: 8.4 fL (ref 0–99.8)
POC LYMPH PERCENT: 25.9 %L (ref 10–50)
POC MID %: 6.6 %M (ref 0–12)
Platelet Count, POC: 285 10*3/uL (ref 142–424)
RBC: 4.36 M/uL — AB (ref 4.69–6.13)
RDW, POC: 15.3 %
WBC: 9.1 10*3/uL (ref 4.6–10.2)

## 2013-06-05 LAB — COMPREHENSIVE METABOLIC PANEL
ALT: 30 U/L (ref 0–53)
AST: 19 U/L (ref 0–37)
Alkaline Phosphatase: 133 U/L — ABNORMAL HIGH (ref 39–117)
BUN: 21 mg/dL (ref 6–23)
Chloride: 103 mEq/L (ref 96–112)
Creat: 1.01 mg/dL (ref 0.50–1.35)
Total Bilirubin: 0.5 mg/dL (ref 0.3–1.2)

## 2013-06-05 LAB — PSA: PSA: 0.87 ng/mL (ref <=4.00)

## 2013-06-05 LAB — LIPID PANEL
HDL: 58 mg/dL (ref >39)
LDL Cholesterol: 146 mg/dL — ABNORMAL HIGH (ref 0–99)
Total CHOL/HDL Ratio: 4.1 Ratio
VLDL: 31 mg/dL (ref 0–40)

## 2013-06-05 NOTE — Addendum Note (Signed)
Addended by: Cira Rue R on: 06/05/2013 10:41 AM   Modules accepted: Orders

## 2013-06-05 NOTE — Progress Notes (Signed)
[facility] [address] [place] [address] [phone]  Date:  06/05/2013   Name:  SIRAJ DERMODY   DOB:  [birth year]   MRN:  [id number]  PCP:  Abbe Amsterdam, MD    Chief Complaint: Annual Exam   History of Present Illness:  Bryan [name] is a 67 y.o. very pleasant male patient who presents with the following:  Continues to have pain in the right hip.  Says that he received only minimal relief with the medication which is now exhausted.  Denies injury or overuse.  No adverse effect of medication.  No improvement with over the counter medications or other home remedies. Denies other complaint or health concern today.   Patient Active Problem List   Diagnosis Date Noted  . Rheumatoid factor positive 10/26/2012  . Nonspecific abnormal finding in stool contents 07/28/2012  . Multiple joint pain 06/05/2012  . History of tobacco use-  06/05/2012  . Asthmatic bronchitis 12/15/2011  . BPH (benign prostatic hyperplasia) 12/15/2011  . Essential tremor 12/15/2011  . HTN (hypertension) 12/15/2011    Past Medical History  Diagnosis Date  . Obesity   . HTN (hypertension)   . BPH (benign prostatic hypertrophy)   . Seizures     AS A CHILD.  [name] Ulcer   . Sleep apnea     sleep study, produced known sleep apnea, pt. has not gotten device yet, wants to get his neck fixed 1st   . Hearing deficit     wears hearing aids bilateral  . GERD (gastroesophageal reflux disease)     reports for indigestion he uses mustard   . Arthritis     hands, knees, cervical area  . Essential tremor   . Anxiety     Past Surgical History  Procedure Laterality Date  . Tonsillectomy    . Eye surgery      right, growth excision  . Colonoscopy    . Vasectomy    . Anterior cervical decomp/discectomy fusion N/A 03/30/2013    Procedure: ANTERIOR CERVICAL DECOMPRESSION/DISCECTOMY FUSION 2 LEVELS;  Surgeon: Clydene Fake, MD;  Location: [facility];  Service: Neurosurgery;   Laterality: N/A;  C4-5 C5-6 Anterior cervical decompression/diskectomy/fusion/Allograft/Plate  . Spine surgery      History  Substance Use Topics  . Smoking status: Former Smoker    Types: Cigarettes    Quit date: 12/14/1972  . Smokeless tobacco: Never Used  . Alcohol Use: Yes     Comment: social    Family History  Problem Relation Age of Onset  . Colon cancer Maternal Grandfather   . Diabetes Paternal Aunt     x 4 aunts  . Kidney disease Brother   . Stroke Maternal Grandmother     Allergies  Allergen Reactions  . Ampicillin Nausea Only  . Hydrocodone Other (See Comments)    Hallucinations     Medication list has been reviewed and updated.  Current Outpatient Prescriptions on File Prior to Visit  Medication Sig Dispense Refill  . aspirin 81 MG tablet Take 81 mg by mouth daily. Last dose taken 5/11//2014 in anticipation of surgery      . diclofenac (VOLTAREN) 75 MG EC tablet Take 1 tablet (75 mg total) by mouth 2 (two) times daily.  30 tablet  0  . gabapentin (NEURONTIN) 100 MG capsule Take 200-300 mg by mouth 2 (two) times daily. Takes 2 capsules in am and 3 capsules at night      . losartan-hydrochlorothiazide (  HYZAAR) 50-12.5 MG per tablet Take 1 tablet by mouth daily.  90 tablet  1  . primidone (MYSOLINE) 50 MG tablet Take 100 mg by mouth at bedtime.      . Tamsulosin HCl (FLOMAX) 0.4 MG CAPS Take 0.4 mg by mouth at bedtime.       [name] acetaminophen (TYLENOL) 500 MG tablet Take 500 mg by mouth every 6 (six) hours as needed for pain.      [name] ibuprofen (ADVIL,MOTRIN) 800 MG tablet Take 1 tablet (800 mg total) by mouth 3 (three) times daily.  21 tablet  0  . predniSONE (DELTASONE) 20 MG tablet 2 daily with food  10 tablet  1   No current facility-administered medications on file prior to visit.    Review of Systems:  As per HPI, otherwise negative.    Physical Examination: Filed Vitals:   06/05/13 0827  BP: 142/80  Pulse: 71  Temp: 97.7 F (36.5 C)  Resp: 18    Filed Vitals:   06/05/13 0827  Height: 5\' 4"  (1.626 m)  Weight: 226 lb (102.513 kg)   Body mass index is 38.77 kg/(m^2). Ideal Body Weight: Weight in (lb) to have BMI = 25: 145.3  GEN: obese, NAD, Non-toxic, A & O x 3 HEENT: Atraumatic, Normocephalic. Neck supple. No masses, No LAD. Ears and Nose: No external deformity. CV: RRR, No M/G/R. No JVD. No thrill. No extra heart sounds. PULM: CTA B, no wheezes, crackles, rhonchi. No retractions. No resp. distress. No accessory muscle use. ABD: S, NT, ND, +BS. No rebound. No HSM. EXTR: No c/c/e NEURO Normal gait.  PSYCH: Normally interactive. Conversant. Not depressed or anxious appearing.  Calm demeanor.    Assessment and Plan: Wellness examination Labs and follow up based on results Hypertension Right hip bursitis Ortho consultation   Signed,  Phillips Odor, MD

## 2013-06-05 NOTE — Patient Instructions (Addendum)

## 2013-06-10 ENCOUNTER — Telehealth: Payer: Self-pay

## 2013-06-10 NOTE — Telephone Encounter (Signed)
Request given to xray 

## 2013-06-10 NOTE — Telephone Encounter (Signed)
Pt has an appt with an orthopedic dr today at 245 and needs to get copies of his most recent xrays and also copies of all his June visits for insurance purposes   He states he will be her around 2 to pick all of this up

## 2013-06-14 ENCOUNTER — Other Ambulatory Visit: Payer: Self-pay | Admitting: Family Medicine

## 2013-06-15 NOTE — Telephone Encounter (Signed)
Med encounter

## 2013-06-16 ENCOUNTER — Other Ambulatory Visit: Payer: Self-pay | Admitting: Orthopedic Surgery

## 2013-06-16 DIAGNOSIS — M48061 Spinal stenosis, lumbar region without neurogenic claudication: Secondary | ICD-10-CM

## 2013-06-24 ENCOUNTER — Other Ambulatory Visit: Payer: Medicare Other

## 2013-07-19 ENCOUNTER — Ambulatory Visit (INDEPENDENT_AMBULATORY_CARE_PROVIDER_SITE_OTHER): Payer: Medicare Other | Admitting: Family Medicine

## 2013-07-19 ENCOUNTER — Ambulatory Visit: Payer: Medicare Other

## 2013-07-19 VITALS — BP 142/78 | HR 79 | Temp 98.6°F | Resp 18 | Ht 64.0 in | Wt 226.4 lb

## 2013-07-19 DIAGNOSIS — M545 Low back pain, unspecified: Secondary | ICD-10-CM

## 2013-07-19 DIAGNOSIS — M542 Cervicalgia: Secondary | ICD-10-CM

## 2013-07-19 DIAGNOSIS — R351 Nocturia: Secondary | ICD-10-CM

## 2013-07-19 DIAGNOSIS — R3129 Other microscopic hematuria: Secondary | ICD-10-CM

## 2013-07-19 DIAGNOSIS — M47816 Spondylosis without myelopathy or radiculopathy, lumbar region: Secondary | ICD-10-CM

## 2013-07-19 LAB — POCT URINALYSIS DIPSTICK
Bilirubin, UA: NEGATIVE
Glucose, UA: NEGATIVE
Leukocytes, UA: NEGATIVE
Nitrite, UA: NEGATIVE
Urobilinogen, UA: 0.2

## 2013-07-19 LAB — POCT UA - MICROSCOPIC ONLY
Casts, Ur, LPF, POC: NEGATIVE
WBC, Ur, HPF, POC: NEGATIVE
Yeast, UA: NEGATIVE

## 2013-07-19 MED ORDER — MELOXICAM 7.5 MG PO TABS: 7.5000 mg | ORAL_TABLET | Freq: Every day | ORAL | Status: DC

## 2013-07-19 NOTE — Patient Instructions (Addendum)
Try the mobic as needed for back pain.  You can take 1 or 2 daily as needed.  Try to stay active and keep your weight under control  I will be in touch with the results of your urine culture Continue your flomax.  Remember to watch your evening fluid intake, and try to void twice each time you wake up.

## 2013-07-19 NOTE — Progress Notes (Addendum)
[facility] [address] [place] [address] [phone]  Date:  07/19/2013   Name:  Bryan [name]   DOB:  [birth year]   MRN:  [id number]  PCP:  Abbe Amsterdam, MD    Chief Complaint: Back Pain   History of Present Illness:  Bryan [name] is a 67 y.o. very pleasant male patient who presents with the following:  Here today to recheck back pain.  Last seen by myself on 7/18, treated with robaxin for hip pain.  He returned 2 days later and was treated with prednisone PO.  He came back a few days later for a PE, and was referred to ortho for his hip.  However, he did not go to this appt.   He had been on meloxicam fairly regularly prior to his neck surgery.  He is no longer taking this, and wonders if he should start again.  He wonders if it was controlling his back pain.   His neck surgery was in May of this year.   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   No numbness or weakness of his legs,  No bowel or bladder control issues (he does have to get up a lot at night to urinate). He is on Flomax- he has noted increased nocturia for about 3 weeks.  He used to just get up 2x a night and now is up 3x.   PSA was normal in July of this year.  He does not have any pain with urination.    He forgot to take his BP pill this morning  He does have a home BP cuff and will follow his BP at home No history of GI bleed, no history of heart problems except for HTN   Patient Active Problem List   Diagnosis Date Noted  . Obesity, unspecified 06/05/2013  . Rheumatoid factor positive 10/26/2012  . Nonspecific abnormal finding in stool contents 07/28/2012  . Multiple joint pain 06/05/2012  . History of tobacco use-  06/05/2012  . Asthmatic bronchitis 12/15/2011  . BPH (benign prostatic hyperplasia) 12/15/2011  . Essential tremor 12/15/2011  . HTN (hypertension) 12/15/2011    Past  Medical History  Diagnosis Date  . Obesity   . HTN (hypertension)   . BPH (benign prostatic hypertrophy)   . Seizures     AS A CHILD.  [name] Ulcer   . Sleep apnea     sleep study, produced known sleep apnea, pt. has not gotten device yet, wants to get his neck fixed 1st   . Hearing deficit     wears hearing aids bilateral  . GERD (gastroesophageal reflux disease)     reports for indigestion he uses mustard   . Arthritis     hands, knees, cervical area  . Essential tremor   . Anxiety     Past Surgical History  Procedure Laterality Date  . Tonsillectomy    . Eye surgery      right, growth excision  . Colonoscopy    . Vasectomy    . Anterior cervical decomp/discectomy fusion N/A 03/30/2013    Procedure: ANTERIOR CERVICAL DECOMPRESSION/DISCECTOMY FUSION 2 LEVELS;  Surgeon: Clydene Fake, MD;  Location: [facility];  Service: Neurosurgery;  Laterality: N/A;  C4-5 C5-6 Anterior cervical decompression/diskectomy/fusion/Allograft/Plate  . Spine surgery      History  Substance Use  Topics  . Smoking status: Former Smoker    Types: Cigarettes    Quit date: 12/14/1972  . Smokeless tobacco: Never Used  . Alcohol Use: Yes     Comment: social    Family History  Problem Relation Age of Onset  . Colon cancer Maternal Grandfather   . Diabetes Paternal Aunt     x 4 aunts  . Kidney disease Brother   . Stroke Maternal Grandmother     Allergies  Allergen Reactions  . Ampicillin Nausea Only  . Hydrocodone Other (See Comments)    Hallucinations     Medication list has been reviewed and updated.  Current Outpatient Prescriptions on File Prior to Visit  Medication Sig Dispense Refill  . aspirin 81 MG tablet Take 81 mg by mouth daily. Last dose taken 5/11//2014 in anticipation of surgery      . diclofenac (VOLTAREN) 75 MG EC tablet Take 1 tablet (75 mg total) by mouth 2 (two) times daily.  30 tablet  0  . losartan-hydrochlorothiazide (HYZAAR) 50-12.5 MG per tablet TAKE ONE TABLET BY  MOUTH EVERY DAY  90 tablet  0  . primidone (MYSOLINE) 50 MG tablet Take 100 mg by mouth at bedtime.      . Tamsulosin HCl (FLOMAX) 0.4 MG CAPS Take 0.4 mg by mouth at bedtime.       [name] acetaminophen (TYLENOL) 500 MG tablet Take 500 mg by mouth every 6 (six) hours as needed for pain.      [name] gabapentin (NEURONTIN) 100 MG capsule Take 200-300 mg by mouth 2 (two) times daily. Takes 2 capsules in am and 3 capsules at night      . ibuprofen (ADVIL,MOTRIN) 800 MG tablet Take 1 tablet (800 mg total) by mouth 3 (three) times daily.  21 tablet  0  . predniSONE (DELTASONE) 20 MG tablet 2 daily with food  10 tablet  1   No current facility-administered medications on file prior to visit.    Review of Systems:  As per HPI- otherwise negative.   Physical Examination: Filed Vitals:   07/19/13 1035  BP: 142/78  Pulse: 79  Temp: 98.6 F (37 C)  Resp: 18   Filed Vitals:   07/19/13 1035  Height: 5\' 4"  (1.626 m)  Weight: 226 lb 6.4 oz (102.694 kg)   Body mass index is 38.84 kg/(m^2). Ideal Body Weight: Weight in (lb) to have BMI = 25: 145.3  GEN: WDWN, NAD, Non-toxic, A & O x 3, obese HEENT: Atraumatic, Normocephalic. Neck supple. No masses, No LAD. Ears and Nose: No external deformity. CV: RRR, No M/G/R. No JVD. No thrill. No extra heart sounds. PULM: CTA B, no wheezes, crackles, rhonchi. No retractions. No resp. distress. No accessory muscle use. EXTR: No c/c/e NEURO Normal gait.  PSYCH: Normally interactive. Conversant. Not depressed or anxious appearing.  Calm demeanor.  Back: he indicated the middle of his lower back as the area of concern. Normal flexion, although flexion does cause discomfort.  Normal LE strength, sensation and DTR bilaterally, negative SLR bilaterally  [facility] reading (PRIMARY) by  Dr. Patsy Lager. Lumbar spine: moderate degenerative changes, especially at L2- L4.  No acute fracture noted.    LUMBAR SPINE - COMPLETE 4+ VIEW  Comparison: CT 04/06/2013  Findings:  Vertebral body alignment is within normal. There is moderate spondylosis throughout the lumbar spine to include facet arthropathy. There is moderate degenerate changes of the visualized lower thoracic spine. There is mild loss of height of L3 likely chronic without  significant change from the CT scan. There is disc space narrowing with reactive endplate changes at the L2-3, L3-4 and L5-L1 levels unchanged. There is mild degenerate change of the left sacroiliac joint.  IMPRESSION: Moderate spondylosis of the lumbar spine with multilevel degenerate disc disease as described. Subtle loss of height of L3 unchanged.   Results for orders placed in visit on 07/19/13  POCT UA - MICROSCOPIC ONLY      Result Value Range   WBC, Ur, HPF, POC negative     RBC, urine, microscopic 0-3     Bacteria, U Microscopic negative     Mucus, UA trace     Epithelial cells, urine per micros negative     Crystals, Ur, HPF, POC negative     Casts, Ur, LPF, POC negative     Yeast, UA negative    POCT URINALYSIS DIPSTICK      Result Value Range   Color, UA yellow     Clarity, UA clear     Glucose, UA negative     Bilirubin, UA negative     Ketones, UA negative     Spec Grav, UA 1.025     Blood, UA negative     pH, UA 5.5     Protein, UA negative     Urobilinogen, UA 0.2     Nitrite, UA negative     Leukocytes, UA Negative      Assessment and Plan: Lower back pain - Plan: DG Lumbar Spine Complete  Nocturia - Plan: POCT UA - Microscopic Only, POCT urinalysis dipstick, Urine culture  Arthritis of lumbar spine - Plan: meloxicam (MOBIC) 7.5 MG tablet  Degenerative changes in the lumbar spine.  He will go back on mobic and see if it helps to relieve his sx.   Await urine culture.  Asked him to watch his BP as it may go up with this medication.  Try "double void" when he awakes at night.    Signed Abbe Amsterdam, MD  9/11  Called to go over lab results.  Explained that he did have 0-3 RBC in his  urine.  He was a smoker, but quit 30 years ago.  Offered urology referral vs recheck of his UA. Advised that microhematuria can be a sign of bladder cancer which is most common in people who have smoked.  He would rather recheck, will place order on his chart and he will recheck in a few weeks.

## 2013-07-20 ENCOUNTER — Other Ambulatory Visit: Payer: Self-pay

## 2013-07-20 LAB — URINE CULTURE

## 2013-07-20 MED ORDER — LOSARTAN POTASSIUM-HCTZ 50-12.5 MG PO TABS: ORAL_TABLET | ORAL | Status: DC

## 2013-07-23 NOTE — Addendum Note (Signed)
Addended by: Abbe Amsterdam C on: 07/23/2013 03:13 PM   Modules accepted: Orders

## 2013-07-24 ENCOUNTER — Encounter (HOSPITAL_COMMUNITY): Payer: Self-pay | Admitting: Emergency Medicine

## 2013-07-24 ENCOUNTER — Emergency Department (HOSPITAL_COMMUNITY)
Admission: EM | Admit: 2013-07-24 | Discharge: 2013-07-24 | Disposition: A | Discharge status: Home / Self Care | Payer: BC Managed Care – PPO | Attending: Emergency Medicine | Admitting: Emergency Medicine

## 2013-07-24 DIAGNOSIS — I10 Essential (primary) hypertension: Secondary | ICD-10-CM | POA: Insufficient documentation

## 2013-07-24 DIAGNOSIS — H00019 Hordeolum externum unspecified eye, unspecified eyelid: Secondary | ICD-10-CM | POA: Insufficient documentation

## 2013-07-24 DIAGNOSIS — Z87891 Personal history of nicotine dependence: Secondary | ICD-10-CM | POA: Insufficient documentation

## 2013-07-24 DIAGNOSIS — H00013 Hordeolum externum right eye, unspecified eyelid: Secondary | ICD-10-CM

## 2013-07-24 DIAGNOSIS — Z792 Long term (current) use of antibiotics: Secondary | ICD-10-CM | POA: Insufficient documentation

## 2013-07-24 DIAGNOSIS — Z79899 Other long term (current) drug therapy: Secondary | ICD-10-CM | POA: Insufficient documentation

## 2013-07-24 DIAGNOSIS — E669 Obesity, unspecified: Secondary | ICD-10-CM | POA: Insufficient documentation

## 2013-07-24 DIAGNOSIS — M129 Arthropathy, unspecified: Secondary | ICD-10-CM | POA: Insufficient documentation

## 2013-07-24 DIAGNOSIS — Z8659 Personal history of other mental and behavioral disorders: Secondary | ICD-10-CM | POA: Insufficient documentation

## 2013-07-24 DIAGNOSIS — G40909 Epilepsy, unspecified, not intractable, without status epilepticus: Secondary | ICD-10-CM | POA: Insufficient documentation

## 2013-07-24 DIAGNOSIS — N400 Enlarged prostate without lower urinary tract symptoms: Secondary | ICD-10-CM | POA: Insufficient documentation

## 2013-07-24 DIAGNOSIS — Z8719 Personal history of other diseases of the digestive system: Secondary | ICD-10-CM | POA: Insufficient documentation

## 2013-07-24 DIAGNOSIS — Z872 Personal history of diseases of the skin and subcutaneous tissue: Secondary | ICD-10-CM | POA: Insufficient documentation

## 2013-07-24 MED ORDER — GENTAMICIN SULFATE 0.3 % OP SOLN: 1.0000 [drp] | OPHTHALMIC | Status: DC

## 2013-07-24 MED ORDER — PROPARACAINE HCL 0.5 % OP SOLN
1.0000 [drp] | Freq: Once | OPHTHALMIC | Status: AC
  Administered 2013-07-24: 1 [drp] via OPHTHALMIC
  Filled 2013-07-24: qty 15

## 2013-07-24 NOTE — ED Notes (Signed)
Pt states that two days ago he started having slight eye pain and just thought something was in it then yesterday pt's wife noticed it was swollen and pink/reddish.  Pt states that he does have some drainage.

## 2013-07-24 NOTE — ED Provider Notes (Signed)
CSN: [id number]     Arrival date & time 07/24/13  1818 History  This chart was scribed for non-physician practitioner, Junius Finner PA-C, working with Claudean Kinds, MD by Arlan Organ, ED Scribe. This patient was seen in room WTR9/WTR9 and the patient's care was started at 1818.    Chief Complaint  Patient presents with  . Eye Pain    right  . Eye Drainage    The history is provided by the patient. No language interpreter was used.   HPI Comments: Bryan [name] is a 67 y.o. male who presents to the Emergency Department complaining of constant right eye swelling and pain that started last night. Pt described the pain as burning and warm. Pt states he is also experiencing associated drainage from the right eye. He states he sweats frequently and initially thought something may have gotten in his eye. Pt regularly wears glasses. Pt denies any previous similar episodes. Pt denies fever and visual disturbances.  Past Medical History  Diagnosis Date  . Obesity   . HTN (hypertension)   . BPH (benign prostatic hypertrophy)   . Seizures     AS A CHILD.  [name] Ulcer   . Sleep apnea     sleep study, produced known sleep apnea, pt. has not gotten device yet, wants to get his neck fixed 1st   . Hearing deficit     wears hearing aids bilateral  . GERD (gastroesophageal reflux disease)     reports for indigestion he uses mustard   . Arthritis     hands, knees, cervical area  . Essential tremor   . Anxiety    Past Surgical History  Procedure Laterality Date  . Tonsillectomy    . Eye surgery      right, growth excision  . Colonoscopy    . Vasectomy    . Anterior cervical decomp/discectomy fusion N/A 03/30/2013    Procedure: ANTERIOR CERVICAL DECOMPRESSION/DISCECTOMY FUSION 2 LEVELS;  Surgeon: Clydene Fake, MD;  Location: [facility];  Service: Neurosurgery;  Laterality: N/A;  C4-5 C5-6 Anterior cervical decompression/diskectomy/fusion/Allograft/Plate  . Spine surgery     Family  History  Problem Relation Age of Onset  . Colon cancer Maternal Grandfather   . Diabetes Paternal Aunt     x 4 aunts  . Kidney disease Brother   . Stroke Maternal Grandmother    History  Substance Use Topics  . Smoking status: Former Smoker    Types: Cigarettes    Quit date: 12/14/1972  . Smokeless tobacco: Never Used  . Alcohol Use: Yes     Comment: social    Review of Systems  Constitutional: Negative for fever.  Eyes: Positive for pain and discharge. Negative for visual disturbance.  All other systems reviewed and are negative.    Allergies  Ampicillin and Hydrocodone  Home Medications   Current Outpatient Rx  Name  Route  Sig  Dispense  Refill  . losartan-hydrochlorothiazide (HYZAAR) 50-12.5 MG per tablet      TAKE ONE TABLET BY MOUTH EVERY DAY   90 tablet   1   . meloxicam (MOBIC) 7.5 MG tablet   Oral   Take 1 tablet (7.5 mg total) by mouth daily. May take 2 if necessary   60 tablet   3   . primidone (MYSOLINE) 50 MG tablet   Oral   Take 100 mg by mouth at bedtime.         . Tamsulosin HCl (FLOMAX) 0.4 MG CAPS  Oral   Take 0.4 mg by mouth at bedtime.          [name] gentamicin (GARAMYCIN) 0.3 % ophthalmic solution   Right Eye   Place 1 drop into the right eye every 4 (four) hours.   5 mL   0    BP 149/89  Pulse 71  Temp(Src) 98.7 F (37.1 C) (Oral)  Resp 18  SpO2 99% Physical Exam  Nursing note and vitals reviewed. Constitutional: He is oriented to person, place, and time. He appears well-developed and well-nourished.  HENT:  Head: Normocephalic and atraumatic.  Eyes: Conjunctivae and EOM are normal. Pupils are equal, round, and reactive to light. Lids are everted and swept, no foreign bodies found. Right eye exhibits discharge (skant yellow discharge) and hordeolum. Right eye exhibits no chemosis and no exudate. No foreign body present in the right eye. Left eye exhibits no chemosis, no discharge, no exudate and no hordeolum. No foreign body  present in the left eye. No scleral icterus.  Slit lamp exam:      The right eye shows no corneal abrasion, no corneal ulcer, no foreign body and no fluorescein uptake.    Neck: Normal range of motion.  Cardiovascular: Normal rate.   Pulmonary/Chest: Effort normal.  Musculoskeletal: Normal range of motion.  Neurological: He is alert and oriented to person, place, and time.  Skin: Skin is warm and dry.  Psychiatric: He has a normal mood and affect. His behavior is normal.    ED Course  Procedures (including critical care time)  DIAGNOSTIC STUDIES: Oxygen Saturation is 97% on RA, Normal by my interpretation.    COORDINATION OF CARE: 8:08 PM-Discussed treatment plan which includes eye drops to be used a couple times a day, along with warm compress. Advised pt to follow up with eye doctor if issue does not resolve. Pt agreed to plan.   Labs Review Labs Reviewed - No data to display Imaging Review No results found.  MDM   1. Stye, right    Pt has hordeolum of right eye, no corneal abrasions or ulcers. No change in visual acuity.  Discussed pt with Dr. Rolland Porter who also examined pt and agrees with tx plan of gentamicin drops.  F/u with ophthalmologist next week if symptoms not improving.  Advised to also use warm compresses several times per day to aid in healing.  Return precautions provided. Pt verbalized understanding and agreement with tx plan.  I personally performed the services described in this documentation, which was scribed in my presence. The recorded information has been reviewed and is accurate.   Junius Finner, PA-C 07/25/13 [phone]

## 2013-07-29 ENCOUNTER — Other Ambulatory Visit (INDEPENDENT_AMBULATORY_CARE_PROVIDER_SITE_OTHER): Payer: Medicare Other | Admitting: Family Medicine

## 2013-07-29 ENCOUNTER — Other Ambulatory Visit: Payer: Self-pay

## 2013-07-29 DIAGNOSIS — R319 Hematuria, unspecified: Secondary | ICD-10-CM

## 2013-07-29 DIAGNOSIS — R3129 Other microscopic hematuria: Secondary | ICD-10-CM

## 2013-07-29 LAB — POCT UA - MICROSCOPIC ONLY
Casts, Ur, LPF, POC: NEGATIVE
Crystals, Ur, HPF, POC: NEGATIVE
Yeast, UA: NEGATIVE

## 2013-07-29 LAB — POCT URINALYSIS DIPSTICK
Ketones, UA: NEGATIVE
Leukocytes, UA: NEGATIVE
Nitrite, UA: NEGATIVE
Protein, UA: NEGATIVE
Urobilinogen, UA: 0.2
pH, UA: 5.5

## 2013-07-29 NOTE — Progress Notes (Signed)
PATIENT HERE FOR LABS ONLY.

## 2013-08-05 NOTE — ED Provider Notes (Signed)
Medical screening examination/treatment/procedure(s) were performed by non-physician practitioner and as supervising physician I was immediately available for consultation/collaboration.   Roney Marion, MD 08/05/13 1100

## 2013-09-17 ENCOUNTER — Other Ambulatory Visit: Payer: Self-pay

## 2013-10-21 ENCOUNTER — Ambulatory Visit (INDEPENDENT_AMBULATORY_CARE_PROVIDER_SITE_OTHER): Payer: Medicare Other | Admitting: Family Medicine

## 2013-10-21 ENCOUNTER — Ambulatory Visit: Payer: Medicare Other

## 2013-10-21 VITALS — BP 124/80 | HR 73 | Temp 98.3°F | Resp 16 | Ht 64.5 in | Wt 222.2 lb

## 2013-10-21 DIAGNOSIS — M47816 Spondylosis without myelopathy or radiculopathy, lumbar region: Secondary | ICD-10-CM

## 2013-10-21 DIAGNOSIS — M79642 Pain in left hand: Secondary | ICD-10-CM

## 2013-10-21 DIAGNOSIS — M79609 Pain in unspecified limb: Secondary | ICD-10-CM

## 2013-10-21 DIAGNOSIS — M47817 Spondylosis without myelopathy or radiculopathy, lumbosacral region: Secondary | ICD-10-CM

## 2013-10-21 NOTE — Progress Notes (Signed)
[facility] [address] [place] [address] [phone]  Date:  10/21/2013   Name:  Bryan [name]   DOB:  08/10/1946   MRN:  [id number]  PCP:  Abbe Amsterdam, MD    Chief Complaint: Wrist Pain   History of Present Illness:  Bryan [name] is a 67 y.o. very pleasant male patient who presents with the following:  He is here today with concern about his left hand.  He notes pain for about 3 weeks. He is not aware of any injury.  He notes worse sx when he first wakes up in the am.  He notes decreased grip strength, and he sometimes has a "zing" type feeling Otherwise he is feeling well today.   He does have a history of positive RF  Patient Active Problem List   Diagnosis Date Noted  . Obesity, unspecified 06/05/2013  . Rheumatoid factor positive 10/26/2012  . Nonspecific abnormal finding in stool contents 07/28/2012  . Multiple joint pain 06/05/2012  . History of tobacco use-  06/05/2012  . Asthmatic bronchitis 12/15/2011  . BPH (benign prostatic hyperplasia) 12/15/2011  . Essential tremor 12/15/2011  . HTN (hypertension) 12/15/2011    Past Medical History  Diagnosis Date  . Obesity   . HTN (hypertension)   . BPH (benign prostatic hypertrophy)   . Seizures     AS A CHILD.  [name] Ulcer   . Sleep apnea     sleep study, produced known sleep apnea, pt. has not gotten device yet, wants to get his neck fixed 1st   . Hearing deficit     wears hearing aids bilateral  . GERD (gastroesophageal reflux disease)     reports for indigestion he uses mustard   . Arthritis     hands, knees, cervical area  . Essential tremor   . Anxiety     Past Surgical History  Procedure Laterality Date  . Tonsillectomy    . Eye surgery      right, growth excision  . Colonoscopy    . Vasectomy    . Anterior cervical decomp/discectomy fusion N/A 03/30/2013    Procedure: ANTERIOR CERVICAL DECOMPRESSION/DISCECTOMY FUSION 2 LEVELS;  Surgeon: Clydene Fake, MD;  Location:  [facility];  Service: Neurosurgery;  Laterality: N/A;  C4-5 C5-6 Anterior cervical decompression/diskectomy/fusion/Allograft/Plate  . Spine surgery      History  Substance Use Topics  . Smoking status: Former Smoker    Types: Cigarettes    Quit date: 12/14/1972  . Smokeless tobacco: Never Used  . Alcohol Use: Yes     Comment: social    Family History  Problem Relation Age of Onset  . Colon cancer Maternal Grandfather   . Diabetes Paternal Aunt     x 4 aunts  . Kidney disease Brother   . Stroke Maternal Grandmother     Allergies  Allergen Reactions  . Ampicillin Nausea Only  . Hydrocodone Other (See Comments)    Hallucinations     Medication list has been reviewed and updated.  Current Outpatient Prescriptions on File Prior to Visit  Medication Sig Dispense Refill  . losartan-hydrochlorothiazide (HYZAAR) 50-12.5 MG per tablet TAKE ONE TABLET BY MOUTH EVERY DAY  90 tablet  1  . meloxicam (MOBIC) 7.5 MG tablet Take 1 tablet (7.5 mg total) by mouth daily. May take 2 if necessary  60 tablet  3  . primidone (MYSOLINE) 50 MG tablet Take 100 mg by mouth at bedtime.      [name]  Tamsulosin HCl (FLOMAX) 0.4 MG CAPS Take 0.4 mg by mouth at bedtime.       [name] gentamicin (GARAMYCIN) 0.3 % ophthalmic solution Place 1 drop into the right eye every 4 (four) hours.  5 mL  0   No current facility-administered medications on file prior to visit.    Review of Systems:  As per HPI- otherwise negative.   Physical Examination: Filed Vitals:   10/21/13 1349  BP: 124/80  Pulse: 73  Temp: 98.3 F (36.8 C)  Resp: 16   Filed Vitals:   10/21/13 1349  Height: 5' 4.5" (1.638 m)  Weight: 222 lb 3.2 oz (100.789 kg)   Body mass index is 37.57 kg/(m^2). Ideal Body Weight: Weight in (lb) to have BMI = 25: 147.6  GEN: WDWN, NAD, Non-toxic, A & O x 3 HEENT: Atraumatic, Normocephalic. Neck supple. No masses, No LAD. Ears and Nose: No external deformity. CV: RRR, No M/G/R. No JVD. No thrill. No  extra heart sounds. PULM: CTA B, no wheezes, crackles, rhonchi. No retractions. No resp. distress. No accessory muscle use. ABD: S, NT, ND, +BS. No rebound. No HSM. EXTR: No c/c/e NEURO Normal gait.  PSYCH: Normally interactive. Conversant. Not depressed or anxious appearing.  Calm demeanor.   [facility] reading (PRIMARY) by  Dr. Patsy Lager. Left hand: mild to moderate degenerative change at the wrist.  LEFT HAND - COMPLETE 3+ VIEW  COMPARISON: None.  FINDINGS: The bones are osteopenia. There is no acute fracture or subluxation identified.  A chronic appearing deformity involves the 1st metacarpal bone. There are degenerative type changes identified involving the 1st metacarpal phalangeal joint and the 1st anterior phalanges joint. Joint space narrowing is noted at the radiocarpal joint. There may be a small erosion involving the distal pole of the scaphoid bone.  IMPRESSION: 1. Radiocarpal joint narrowing and possible small erosion involving the distal pole of the scaphoid bone. Correlate for any clinical signs or symptoms of inflammatory arthropathy. 2. Degenerative joint disease involves the 1st MCP joint and 1st IP joint. 3. Mild osteopenia  Assessment and Plan: Left hand pain - Plan: DG Hand Complete Left  Arthritis of lumbar spine  Wrist pain- possible CTS.  Treat with a spline for protection.  Also see report above- will discuss with pt  Signed Abbe Amsterdam, MD

## 2013-10-21 NOTE — Patient Instructions (Addendum)
Wear your wrist splint as much as is practical for the next week or so.  After that you can just wear it at night Use the mobic as needed for pain.   Let me know if you are not feeling better soon!

## 2013-10-27 ENCOUNTER — Ambulatory Visit (INDEPENDENT_AMBULATORY_CARE_PROVIDER_SITE_OTHER): Payer: Medicare Other | Admitting: Family Medicine

## 2013-10-27 VITALS — BP 122/62 | HR 74 | Temp 98.2°F | Resp 18 | Ht 65.0 in | Wt 220.0 lb

## 2013-10-27 DIAGNOSIS — M129 Arthropathy, unspecified: Secondary | ICD-10-CM

## 2013-10-27 DIAGNOSIS — M25539 Pain in unspecified wrist: Secondary | ICD-10-CM

## 2013-10-27 DIAGNOSIS — M1990 Unspecified osteoarthritis, unspecified site: Secondary | ICD-10-CM

## 2013-10-27 DIAGNOSIS — M25532 Pain in left wrist: Secondary | ICD-10-CM

## 2013-10-27 MED ORDER — PREDNISONE 20 MG PO TABS: 40.0000 mg | ORAL_TABLET | Freq: Every day | ORAL | Status: DC

## 2013-10-27 NOTE — Progress Notes (Signed)
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 He is married.  Patient has several weeks of left wrist pain over the ulnar styloid. He was in a week ago and had x-rays done which showed some erosion of the left navicular and some sclerotic changes in the left thumb metacarpal. Despite the splint that he was given anti-inflammatories it is taking, pain is bad as ever. Pain is worse with movement and palpation of the ulnar styloid. He has no significant pain over the navicular.  Objective: Patient is tender of the left ulnar styloid with good range of motion. X-rays were reviewed which showed a small erosion over the left navicular (scaphoid) and osteophytes along the margins of the left thumb metacarpal.  Assessment: Arthritis pain  Plan:Arthritis - Plan: predniSONE (DELTASONE) 20 MG tablet  Signed, Elvina Sidle, MD

## 2013-10-28 ENCOUNTER — Telehealth: Payer: Self-pay | Admitting: Family Medicine

## 2013-10-28 NOTE — Telephone Encounter (Signed)
Called him.  He reports that Dr. Milus Glazier put him on prednsione yesterday and he is feeling a lot better.  This is great.  Looked back in his paper chart; he did see rheumatology at [facility] in 2013.  Given his recnet x-ray report he may want to consider following up there again.  Gave him their phone number and will mail a copy of his wrist x-ray report to him.  It is possible that he has some sort of auto- immune process.

## 2013-12-06 ENCOUNTER — Encounter (HOSPITAL_COMMUNITY): Payer: Self-pay | Admitting: Emergency Medicine

## 2013-12-06 ENCOUNTER — Observation Stay (HOSPITAL_COMMUNITY)
Admission: EM | Admit: 2013-12-06 | Discharge: 2013-12-08 | Disposition: A | Discharge status: Home / Self Care | Payer: Medicare Other | Attending: General Surgery | Admitting: General Surgery

## 2013-12-06 DIAGNOSIS — K219 Gastro-esophageal reflux disease without esophagitis: Secondary | ICD-10-CM | POA: Insufficient documentation

## 2013-12-06 DIAGNOSIS — K5730 Diverticulosis of large intestine without perforation or abscess without bleeding: Secondary | ICD-10-CM | POA: Insufficient documentation

## 2013-12-06 DIAGNOSIS — Z6830 Body mass index (BMI) 30.0-30.9, adult: Secondary | ICD-10-CM | POA: Insufficient documentation

## 2013-12-06 DIAGNOSIS — G4730 Sleep apnea, unspecified: Secondary | ICD-10-CM | POA: Insufficient documentation

## 2013-12-06 DIAGNOSIS — M069 Rheumatoid arthritis, unspecified: Secondary | ICD-10-CM | POA: Insufficient documentation

## 2013-12-06 DIAGNOSIS — M4120 Other idiopathic scoliosis, site unspecified: Secondary | ICD-10-CM | POA: Insufficient documentation

## 2013-12-06 DIAGNOSIS — K7689 Other specified diseases of liver: Secondary | ICD-10-CM | POA: Insufficient documentation

## 2013-12-06 DIAGNOSIS — IMO0001 Reserved for inherently not codable concepts without codable children: Secondary | ICD-10-CM

## 2013-12-06 DIAGNOSIS — Z79899 Other long term (current) drug therapy: Secondary | ICD-10-CM | POA: Insufficient documentation

## 2013-12-06 DIAGNOSIS — K810 Acute cholecystitis: Secondary | ICD-10-CM

## 2013-12-06 DIAGNOSIS — Z87891 Personal history of nicotine dependence: Secondary | ICD-10-CM | POA: Insufficient documentation

## 2013-12-06 DIAGNOSIS — E669 Obesity, unspecified: Secondary | ICD-10-CM | POA: Diagnosis present

## 2013-12-06 DIAGNOSIS — Z888 Allergy status to other drugs, medicaments and biological substances status: Secondary | ICD-10-CM | POA: Insufficient documentation

## 2013-12-06 DIAGNOSIS — K812 Acute cholecystitis with chronic cholecystitis: Principal | ICD-10-CM | POA: Diagnosis present

## 2013-12-06 DIAGNOSIS — M2550 Pain in unspecified joint: Secondary | ICD-10-CM | POA: Diagnosis present

## 2013-12-06 DIAGNOSIS — Z531 Procedure and treatment not carried out because of patient's decision for reasons of belief and group pressure: Secondary | ICD-10-CM

## 2013-12-06 DIAGNOSIS — N400 Enlarged prostate without lower urinary tract symptoms: Secondary | ICD-10-CM | POA: Diagnosis present

## 2013-12-06 DIAGNOSIS — I10 Essential (primary) hypertension: Secondary | ICD-10-CM | POA: Diagnosis present

## 2013-12-06 LAB — URINALYSIS, ROUTINE W REFLEX MICROSCOPIC
BILIRUBIN URINE: NEGATIVE
GLUCOSE, UA: NEGATIVE mg/dL
Hgb urine dipstick: NEGATIVE
Ketones, ur: NEGATIVE mg/dL
Leukocytes, UA: NEGATIVE
Nitrite: NEGATIVE
PH: 7 (ref 5.0–8.0)
Protein, ur: NEGATIVE mg/dL
SPECIFIC GRAVITY, URINE: 1.018 (ref 1.005–1.030)
Urobilinogen, UA: 0.2 mg/dL (ref 0.0–1.0)

## 2013-12-06 NOTE — ED Notes (Signed)
Pt arrived to the ED with a complaint of abdominal pain.  Pt has had right sided abdominal pain that radiates to his back for the last hours.  Pt states that the pain is not relieved by any action he has tried.  Pt states he urinated prior to the pain but has not been able to urinate since.

## 2013-12-07 ENCOUNTER — Encounter (HOSPITAL_COMMUNITY): Payer: Self-pay | Admitting: General Surgery

## 2013-12-07 ENCOUNTER — Observation Stay (HOSPITAL_COMMUNITY): Payer: Medicare Other

## 2013-12-07 ENCOUNTER — Encounter (HOSPITAL_COMMUNITY)
Admission: EM | Disposition: A | Discharge status: Home / Self Care | Payer: Self-pay | Source: Home / Self Care | Attending: Emergency Medicine

## 2013-12-07 ENCOUNTER — Emergency Department (HOSPITAL_COMMUNITY): Payer: Medicare Other

## 2013-12-07 ENCOUNTER — Encounter (HOSPITAL_COMMUNITY): Payer: Medicare Other | Admitting: Anesthesiology

## 2013-12-07 ENCOUNTER — Observation Stay (HOSPITAL_COMMUNITY): Payer: Medicare Other | Admitting: Anesthesiology

## 2013-12-07 DIAGNOSIS — E669 Obesity, unspecified: Secondary | ICD-10-CM | POA: Diagnosis present

## 2013-12-07 DIAGNOSIS — K810 Acute cholecystitis: Secondary | ICD-10-CM

## 2013-12-07 DIAGNOSIS — Z531 Procedure and treatment not carried out because of patient's decision for reasons of belief and group pressure: Secondary | ICD-10-CM

## 2013-12-07 DIAGNOSIS — K812 Acute cholecystitis with chronic cholecystitis: Secondary | ICD-10-CM | POA: Diagnosis present

## 2013-12-07 DIAGNOSIS — K8000 Calculus of gallbladder with acute cholecystitis without obstruction: Secondary | ICD-10-CM

## 2013-12-07 DIAGNOSIS — IMO0001 Reserved for inherently not codable concepts without codable children: Secondary | ICD-10-CM

## 2013-12-07 HISTORY — PX: CHOLECYSTECTOMY: SHX55

## 2013-12-07 LAB — CBC
HCT: 38.9 % — ABNORMAL LOW (ref 39.0–52.0)
Hemoglobin: 13 g/dL (ref 13.0–17.0)
MCH: 30 pg (ref 26.0–34.0)
MCHC: 33.4 g/dL (ref 30.0–36.0)
MCV: 89.6 fL (ref 78.0–100.0)
PLATELETS: 265 10*3/uL (ref 150–400)
RBC: 4.34 MIL/uL (ref 4.22–5.81)
RDW: 14.2 % (ref 11.5–15.5)
WBC: 10.8 10*3/uL — ABNORMAL HIGH (ref 4.0–10.5)

## 2013-12-07 LAB — COMPREHENSIVE METABOLIC PANEL
ALBUMIN: 3.6 g/dL (ref 3.5–5.2)
ALT: 14 U/L (ref 0–53)
AST: 20 U/L (ref 0–37)
Alkaline Phosphatase: 139 U/L — ABNORMAL HIGH (ref 39–117)
BUN: 14 mg/dL (ref 6–23)
CO2: 26 mEq/L (ref 19–32)
Calcium: 8.9 mg/dL (ref 8.4–10.5)
Chloride: 98 mEq/L (ref 96–112)
Creatinine, Ser: 0.92 mg/dL (ref 0.50–1.35)
GFR calc Af Amer: >90 mL/min (ref >90)
GFR calc non Af Amer: 84 mL/min — ABNORMAL LOW (ref >90)
Glucose, Bld: 137 mg/dL — ABNORMAL HIGH (ref 70–99)
Potassium: 3.7 mEq/L (ref 3.7–5.3)
SODIUM: 138 meq/L (ref 137–147)
TOTAL PROTEIN: 8 g/dL (ref 6.0–8.3)
Total Bilirubin: 0.2 mg/dL — ABNORMAL LOW (ref 0.3–1.2)

## 2013-12-07 LAB — LIPASE, BLOOD: Lipase: 22 U/L (ref 11–59)

## 2013-12-07 SURGERY — LAPAROSCOPIC CHOLECYSTECTOMY WITH INTRAOPERATIVE CHOLANGIOGRAM
Anesthesia: General | Site: Abdomen

## 2013-12-07 MED ORDER — PROMETHAZINE HCL 25 MG/ML IJ SOLN: 6.2500 mg | Freq: Four times a day (QID) | INTRAMUSCULAR | Status: DC | PRN

## 2013-12-07 MED ORDER — PROPOFOL 10 MG/ML IV BOLUS
INTRAVENOUS | Status: AC
  Filled 2013-12-07: qty 20

## 2013-12-07 MED ORDER — METRONIDAZOLE IN NACL 5-0.79 MG/ML-% IV SOLN
INTRAVENOUS | Status: AC
  Filled 2013-12-07: qty 100

## 2013-12-07 MED ORDER — SODIUM CHLORIDE 0.9 % IV SOLN
INTRAVENOUS | Status: DC
  Administered 2013-12-07: 01:00:00 via INTRAVENOUS

## 2013-12-07 MED ORDER — LACTATED RINGERS IV BOLUS (SEPSIS): 1000.0000 mL | Freq: Three times a day (TID) | INTRAVENOUS | Status: DC | PRN

## 2013-12-07 MED ORDER — DIPHENHYDRAMINE HCL 12.5 MG/5ML PO ELIX: 12.5000 mg | ORAL_SOLUTION | Freq: Four times a day (QID) | ORAL | Status: DC | PRN

## 2013-12-07 MED ORDER — FENTANYL CITRATE 0.05 MG/ML IJ SOLN
INTRAMUSCULAR | Status: DC | PRN
  Administered 2013-12-07 (×3): 50 ug via INTRAVENOUS
  Administered 2013-12-07: 100 ug via INTRAVENOUS

## 2013-12-07 MED ORDER — PRIMIDONE 50 MG PO TABS
100.0000 mg | ORAL_TABLET | Freq: Every day | ORAL | Status: DC
  Administered 2013-12-07: 100 mg via ORAL
  Filled 2013-12-07 (×2): qty 2

## 2013-12-07 MED ORDER — TAMSULOSIN HCL 0.4 MG PO CAPS
0.4000 mg | ORAL_CAPSULE | Freq: Every day | ORAL | Status: DC
  Administered 2013-12-07: 0.4 mg via ORAL
  Filled 2013-12-07 (×2): qty 1

## 2013-12-07 MED ORDER — KETOROLAC TROMETHAMINE 30 MG/ML IJ SOLN: 15.0000 mg | Freq: Once | INTRAMUSCULAR | Status: DC | PRN

## 2013-12-07 MED ORDER — PHENYLEPHRINE HCL 10 MG/ML IJ SOLN
INTRAMUSCULAR | Status: DC | PRN
  Administered 2013-12-07 (×3): 80 ug via INTRAVENOUS

## 2013-12-07 MED ORDER — FENTANYL CITRATE 0.05 MG/ML IJ SOLN
INTRAMUSCULAR | Status: AC
  Filled 2013-12-07: qty 5

## 2013-12-07 MED ORDER — HEPARIN SODIUM (PORCINE) 5000 UNIT/ML IJ SOLN
5000.0000 [IU] | Freq: Three times a day (TID) | INTRAMUSCULAR | Status: DC
  Filled 2013-12-07: qty 1

## 2013-12-07 MED ORDER — IOHEXOL 300 MG/ML  SOLN
INTRAMUSCULAR | Status: DC | PRN
  Administered 2013-12-07: 5 mL

## 2013-12-07 MED ORDER — LOSARTAN POTASSIUM 50 MG PO TABS
50.0000 mg | ORAL_TABLET | Freq: Every day | ORAL | Status: DC
  Administered 2013-12-07 – 2013-12-08 (×2): 50 mg via ORAL
  Filled 2013-12-07 (×2): qty 1

## 2013-12-07 MED ORDER — DIPHENHYDRAMINE HCL 50 MG/ML IJ SOLN: 12.5000 mg | Freq: Four times a day (QID) | INTRAMUSCULAR | Status: DC | PRN

## 2013-12-07 MED ORDER — FENTANYL CITRATE 0.05 MG/ML IJ SOLN
50.0000 ug | INTRAMUSCULAR | Status: DC | PRN
  Administered 2013-12-07 (×3): 50 ug via INTRAVENOUS
  Filled 2013-12-07 (×3): qty 2

## 2013-12-07 MED ORDER — HYDROCHLOROTHIAZIDE 12.5 MG PO CAPS
12.5000 mg | ORAL_CAPSULE | Freq: Every day | ORAL | Status: DC
  Administered 2013-12-07: 12.5 mg via ORAL
  Filled 2013-12-07: qty 1

## 2013-12-07 MED ORDER — ONDANSETRON HCL 4 MG/2ML IJ SOLN
4.0000 mg | Freq: Once | INTRAMUSCULAR | Status: AC
  Administered 2013-12-07: 4 mg via INTRAVENOUS
  Filled 2013-12-07: qty 2

## 2013-12-07 MED ORDER — ONDANSETRON HCL 4 MG/2ML IJ SOLN
INTRAMUSCULAR | Status: AC
  Filled 2013-12-07: qty 2

## 2013-12-07 MED ORDER — NEOSTIGMINE METHYLSULFATE 1 MG/ML IJ SOLN
INTRAMUSCULAR | Status: AC
  Filled 2013-12-07: qty 10

## 2013-12-07 MED ORDER — OXYCODONE HCL 5 MG PO TABS
5.0000 mg | ORAL_TABLET | ORAL | Status: DC | PRN
  Administered 2013-12-08: 10 mg via ORAL
  Administered 2013-12-08: 5 mg via ORAL
  Filled 2013-12-07: qty 1
  Filled 2013-12-07: qty 2

## 2013-12-07 MED ORDER — KETOROLAC TROMETHAMINE 30 MG/ML IJ SOLN
INTRAMUSCULAR | Status: DC | PRN
  Administered 2013-12-07: 30 mg via INTRAVENOUS

## 2013-12-07 MED ORDER — MENTHOL 3 MG MT LOZG
1.0000 | LOZENGE | OROMUCOSAL | Status: DC | PRN
  Filled 2013-12-07: qty 9

## 2013-12-07 MED ORDER — ACETAMINOPHEN 500 MG PO TABS
500.0000 mg | ORAL_TABLET | Freq: Four times a day (QID) | ORAL | Status: DC | PRN
  Filled 2013-12-07: qty 2

## 2013-12-07 MED ORDER — PHENOL 1.4 % MT LIQD: 2.0000 | OROMUCOSAL | Status: DC | PRN

## 2013-12-07 MED ORDER — PHENYLEPHRINE 40 MCG/ML (10ML) SYRINGE FOR IV PUSH (FOR BLOOD PRESSURE SUPPORT)
PREFILLED_SYRINGE | INTRAVENOUS | Status: AC
  Filled 2013-12-07: qty 10

## 2013-12-07 MED ORDER — MELOXICAM 7.5 MG PO TABS
7.5000 mg | ORAL_TABLET | Freq: Every day | ORAL | Status: DC
  Administered 2013-12-08: 7.5 mg via ORAL
  Filled 2013-12-07 (×2): qty 1

## 2013-12-07 MED ORDER — METRONIDAZOLE IN NACL 5-0.79 MG/ML-% IV SOLN
500.0000 mg | Freq: Four times a day (QID) | INTRAVENOUS | Status: DC
  Administered 2013-12-07 – 2013-12-08 (×3): 500 mg via INTRAVENOUS
  Filled 2013-12-07 (×7): qty 100

## 2013-12-07 MED ORDER — PANTOPRAZOLE SODIUM 40 MG IV SOLR
40.0000 mg | Freq: Every day | INTRAVENOUS | Status: DC
  Administered 2013-12-07: 40 mg via INTRAVENOUS
  Filled 2013-12-07 (×2): qty 40

## 2013-12-07 MED ORDER — BUPIVACAINE-EPINEPHRINE 0.25% -1:200000 IJ SOLN
INTRAMUSCULAR | Status: AC
  Filled 2013-12-07: qty 1

## 2013-12-07 MED ORDER — CISATRACURIUM BESYLATE 20 MG/10ML IV SOLN
INTRAVENOUS | Status: AC
  Filled 2013-12-07: qty 10

## 2013-12-07 MED ORDER — PROMETHAZINE HCL 25 MG/ML IJ SOLN: 6.2500 mg | INTRAMUSCULAR | Status: DC | PRN

## 2013-12-07 MED ORDER — LOSARTAN POTASSIUM-HCTZ 50-12.5 MG PO TABS: 1.0000 | ORAL_TABLET | Freq: Every morning | ORAL | Status: DC

## 2013-12-07 MED ORDER — LACTATED RINGERS IV SOLN
INTRAVENOUS | Status: DC
  Administered 2013-12-07: 1000 mL via INTRAVENOUS
  Administered 2013-12-07: 18:00:00 via INTRAVENOUS

## 2013-12-07 MED ORDER — HYDROCODONE-ACETAMINOPHEN 5-325 MG PO TABS: 1.0000 | ORAL_TABLET | ORAL | Status: DC | PRN

## 2013-12-07 MED ORDER — BUPIVACAINE-EPINEPHRINE PF 0.25-1:200000 % IJ SOLN
INTRAMUSCULAR | Status: AC
  Filled 2013-12-07: qty 30

## 2013-12-07 MED ORDER — GLYCOPYRROLATE 0.2 MG/ML IJ SOLN
INTRAMUSCULAR | Status: DC | PRN
  Administered 2013-12-07: 0.6 mg via INTRAVENOUS

## 2013-12-07 MED ORDER — LABETALOL HCL 5 MG/ML IV SOLN
INTRAVENOUS | Status: DC | PRN
  Administered 2013-12-07 (×2): 5 mg via INTRAVENOUS

## 2013-12-07 MED ORDER — 0.9 % SODIUM CHLORIDE (POUR BTL) OPTIME
TOPICAL | Status: DC | PRN
  Administered 2013-12-07: 1000 mL

## 2013-12-07 MED ORDER — NEOSTIGMINE METHYLSULFATE 1 MG/ML IJ SOLN
INTRAMUSCULAR | Status: DC | PRN
Start: 2013-12-07 — End: 2013-12-07
  Administered 2013-12-07: 5 mg via INTRAVENOUS

## 2013-12-07 MED ORDER — PROPOFOL 10 MG/ML IV BOLUS
INTRAVENOUS | Status: DC | PRN
  Administered 2013-12-07: 200 mg via INTRAVENOUS

## 2013-12-07 MED ORDER — ONDANSETRON HCL 4 MG/2ML IJ SOLN: 4.0000 mg | Freq: Four times a day (QID) | INTRAMUSCULAR | Status: DC | PRN

## 2013-12-07 MED ORDER — PHENYLEPHRINE HCL 10 MG/ML IJ SOLN
INTRAMUSCULAR | Status: AC
  Filled 2013-12-07: qty 1

## 2013-12-07 MED ORDER — BUPIVACAINE-EPINEPHRINE (PF) 0.25% -1:200000 IJ SOLN
INTRAMUSCULAR | Status: DC | PRN
  Administered 2013-12-07: 50 mL

## 2013-12-07 MED ORDER — DEXAMETHASONE SODIUM PHOSPHATE 10 MG/ML IJ SOLN
INTRAMUSCULAR | Status: DC | PRN
  Administered 2013-12-07: 10 mg via INTRAVENOUS

## 2013-12-07 MED ORDER — CIPROFLOXACIN IN D5W 400 MG/200ML IV SOLN
INTRAVENOUS | Status: AC
  Filled 2013-12-07: qty 200

## 2013-12-07 MED ORDER — LIDOCAINE HCL (CARDIAC) 20 MG/ML IV SOLN
INTRAVENOUS | Status: DC | PRN
  Administered 2013-12-07: 100 mg via INTRAVENOUS

## 2013-12-07 MED ORDER — LABETALOL HCL 5 MG/ML IV SOLN
INTRAVENOUS | Status: AC
  Filled 2013-12-07: qty 4

## 2013-12-07 MED ORDER — CISATRACURIUM BESYLATE (PF) 10 MG/5ML IV SOLN
INTRAVENOUS | Status: DC | PRN
  Administered 2013-12-07: 6 mg via INTRAVENOUS

## 2013-12-07 MED ORDER — GLUCAGON HCL (RDNA) 1 MG IJ SOLR
INTRAMUSCULAR | Status: AC
  Filled 2013-12-07: qty 1

## 2013-12-07 MED ORDER — MORPHINE SULFATE 2 MG/ML IJ SOLN
1.0000 mg | INTRAMUSCULAR | Status: DC | PRN
  Administered 2013-12-07: 1 mg via INTRAVENOUS
  Filled 2013-12-07: qty 1

## 2013-12-07 MED ORDER — SODIUM CHLORIDE 0.9 % IV SOLN
10.0000 mg | INTRAVENOUS | Status: DC | PRN
  Administered 2013-12-07: 20 ug/min via INTRAVENOUS

## 2013-12-07 MED ORDER — LACTATED RINGERS IR SOLN
Status: DC | PRN
  Administered 2013-12-07: 1000 mL

## 2013-12-07 MED ORDER — GLYCOPYRROLATE 0.2 MG/ML IJ SOLN
INTRAMUSCULAR | Status: AC
  Filled 2013-12-07: qty 3

## 2013-12-07 MED ORDER — KETOROLAC TROMETHAMINE 30 MG/ML IJ SOLN
INTRAMUSCULAR | Status: AC
  Filled 2013-12-07: qty 1

## 2013-12-07 MED ORDER — ONDANSETRON HCL 4 MG/2ML IJ SOLN
INTRAMUSCULAR | Status: DC | PRN
  Administered 2013-12-07: 4 mg via INTRAVENOUS

## 2013-12-07 MED ORDER — SUCCINYLCHOLINE CHLORIDE 20 MG/ML IJ SOLN
INTRAMUSCULAR | Status: DC | PRN
  Administered 2013-12-07: 100 mg via INTRAVENOUS

## 2013-12-07 MED ORDER — METRONIDAZOLE IN NACL 5-0.79 MG/ML-% IV SOLN
INTRAVENOUS | Status: DC | PRN
  Administered 2013-12-07: 500 mg via INTRAVENOUS

## 2013-12-07 MED ORDER — METOPROLOL TARTRATE 1 MG/ML IV SOLN
5.0000 mg | Freq: Four times a day (QID) | INTRAVENOUS | Status: DC | PRN
  Filled 2013-12-07: qty 5

## 2013-12-07 MED ORDER — ACETAMINOPHEN 325 MG PO TABS: 650.0000 mg | ORAL_TABLET | Freq: Four times a day (QID) | ORAL | Status: DC | PRN

## 2013-12-07 MED ORDER — LIP MEDEX EX OINT
1.0000 "application " | TOPICAL_OINTMENT | Freq: Two times a day (BID) | CUTANEOUS | Status: DC
  Filled 2013-12-07 (×2): qty 7

## 2013-12-07 MED ORDER — ALUM & MAG HYDROXIDE-SIMETH 200-200-20 MG/5ML PO SUSP
30.0000 mL | Freq: Four times a day (QID) | ORAL | Status: DC | PRN
  Filled 2013-12-07: qty 30

## 2013-12-07 MED ORDER — HYDROMORPHONE HCL PF 1 MG/ML IJ SOLN: 0.2500 mg | INTRAMUSCULAR | Status: DC | PRN

## 2013-12-07 MED ORDER — SACCHAROMYCES BOULARDII 250 MG PO CAPS
250.0000 mg | ORAL_CAPSULE | Freq: Two times a day (BID) | ORAL | Status: DC
  Administered 2013-12-08: 250 mg via ORAL
  Filled 2013-12-07 (×3): qty 1

## 2013-12-07 MED ORDER — CIPROFLOXACIN IN D5W 400 MG/200ML IV SOLN
400.0000 mg | Freq: Two times a day (BID) | INTRAVENOUS | Status: DC
  Administered 2013-12-07 – 2013-12-08 (×4): 400 mg via INTRAVENOUS
  Filled 2013-12-07 (×5): qty 200

## 2013-12-07 MED ORDER — METRONIDAZOLE IN NACL 5-0.79 MG/ML-% IV SOLN
500.0000 mg | INTRAVENOUS | Status: AC
  Filled 2013-12-07: qty 100

## 2013-12-07 MED ORDER — ACETAMINOPHEN 650 MG RE SUPP: 650.0000 mg | Freq: Four times a day (QID) | RECTAL | Status: DC | PRN

## 2013-12-07 MED ORDER — BISACODYL 10 MG RE SUPP
10.0000 mg | Freq: Two times a day (BID) | RECTAL | Status: DC | PRN
  Filled 2013-12-07: qty 1

## 2013-12-07 MED ORDER — DEXAMETHASONE SODIUM PHOSPHATE 10 MG/ML IJ SOLN
INTRAMUSCULAR | Status: AC
  Filled 2013-12-07: qty 1

## 2013-12-07 MED ORDER — POTASSIUM CHLORIDE IN NACL 20-0.45 MEQ/L-% IV SOLN
INTRAVENOUS | Status: DC
Start: 2013-12-07 — End: 2013-12-08
  Administered 2013-12-07 – 2013-12-08 (×2): via INTRAVENOUS
  Filled 2013-12-07 (×4): qty 1000

## 2013-12-07 MED ORDER — MAGIC MOUTHWASH
15.0000 mL | Freq: Four times a day (QID) | ORAL | Status: DC | PRN
  Filled 2013-12-07: qty 15

## 2013-12-07 MED ORDER — MAGNESIUM HYDROXIDE 400 MG/5ML PO SUSP
30.0000 mL | Freq: Two times a day (BID) | ORAL | Status: DC | PRN
  Filled 2013-12-07: qty 30

## 2013-12-07 SURGICAL SUPPLY — 47 items
ADH SKN CLS APL DERMABOND .7 (GAUZE/BANDAGES/DRESSINGS)
APPLIER CLIP ROT 10 11.4 M/L (STAPLE)
BAG SPEC RTRVL LRG 6X4 10 (ENDOMECHANICALS) ×1
CANISTER SUCTION 2500CC (MISCELLANEOUS) ×2 IMPLANT
CHLORAPREP W/TINT 26ML (MISCELLANEOUS) ×2 IMPLANT
CLIP APPLIE ROT 10 11.4 M/L (STAPLE) IMPLANT
CLIP LIGATING HEMO O LOK GREEN (MISCELLANEOUS) IMPLANT
CONT SPECI 4OZ STER CLIK (MISCELLANEOUS) IMPLANT
COVER MAYO STAND STRL (DRAPES) ×2 IMPLANT
DECANTER SPIKE VIAL GLASS SM (MISCELLANEOUS) ×2 IMPLANT
DERMABOND ADVANCED (GAUZE/BANDAGES/DRESSINGS)
DERMABOND ADVANCED .7 DNX12 (GAUZE/BANDAGES/DRESSINGS) IMPLANT
DRAPE C-ARM 42X120 X-RAY (DRAPES) ×2 IMPLANT
DRAPE LAPAROSCOPIC ABDOMINAL (DRAPES) ×2 IMPLANT
DRAPE UTILITY XL STRL (DRAPES) ×2 IMPLANT
DRAPE WARM FLUID 44X44 (DRAPE) ×2 IMPLANT
DRSG TEGADERM 2-3/8X2-3/4 SM (GAUZE/BANDAGES/DRESSINGS) ×6 IMPLANT
DRSG TEGADERM 4X4.75 (GAUZE/BANDAGES/DRESSINGS) ×2 IMPLANT
ELECT REM PT RETURN 9FT ADLT (ELECTROSURGICAL) ×2
ELECTRODE REM PT RTRN 9FT ADLT (ELECTROSURGICAL) ×1 IMPLANT
ENDOLOOP SUT PDS II  0 18 (SUTURE) ×1
ENDOLOOP SUT PDS II 0 18 (SUTURE) ×1 IMPLANT
GAUZE SPONGE 2X2 8PLY STRL LF (GAUZE/BANDAGES/DRESSINGS) ×1 IMPLANT
GLOVE BIO SURGEON STRL SZ 6 (GLOVE) IMPLANT
GLOVE BIOGEL PI IND STRL 7.0 (GLOVE) ×1 IMPLANT
GLOVE BIOGEL PI IND STRL 7.5 (GLOVE) ×2 IMPLANT
GLOVE BIOGEL PI INDICATOR 7.0 (GLOVE) ×1
GLOVE BIOGEL PI INDICATOR 7.5 (GLOVE) ×2
GLOVE INDICATOR 6.5 STRL GRN (GLOVE) IMPLANT
GLOVE SURG SS PI 7.5 STRL IVOR (GLOVE) ×2 IMPLANT
GOWN SPEC L3 XXLG W/TWL (GOWN DISPOSABLE) ×2 IMPLANT
GOWN STRL REUS W/TWL XL LVL3 (GOWN DISPOSABLE) ×6 IMPLANT
HEMOSTAT SNOW SURGICEL 2X4 (HEMOSTASIS) IMPLANT
KIT BASIN OR (CUSTOM PROCEDURE TRAY) ×2 IMPLANT
POUCH SPECIMEN RETRIEVAL 10MM (ENDOMECHANICALS) ×2 IMPLANT
SET CHOLANGIOGRAPH MIX (MISCELLANEOUS) ×2 IMPLANT
SET IRRIG TUBING LAPAROSCOPIC (IRRIGATION / IRRIGATOR) ×2 IMPLANT
SOLUTION ANTI FOG 6CC (MISCELLANEOUS) IMPLANT
SPONGE GAUZE 2X2 STER 10/PKG (GAUZE/BANDAGES/DRESSINGS) ×1
SUT MNCRL AB 4-0 PS2 18 (SUTURE) ×2 IMPLANT
TOWEL OR 17X26 10 PK STRL BLUE (TOWEL DISPOSABLE) ×2 IMPLANT
TOWEL OR NON WOVEN STRL DISP B (DISPOSABLE) ×2 IMPLANT
TRAY LAP CHOLE (CUSTOM PROCEDURE TRAY) ×2 IMPLANT
TROCAR BLADELESS OPT 5 75 (ENDOMECHANICALS) ×6 IMPLANT
TROCAR XCEL BLUNT TIP 100MML (ENDOMECHANICALS) IMPLANT
TROCAR XCEL NON-BLD 11X100MML (ENDOMECHANICALS) ×2 IMPLANT
TUBING INSUFFLATION 10FT LAP (TUBING) ×2 IMPLANT

## 2013-12-07 NOTE — Op Note (Signed)
12/06/2013 - 12/07/2013  6:28 PM  PATIENT:  Bryan [name]  68 y.o. male  Patient Care Team: Darreld Mclean, MD as PCP - General (Family Medicine)  PRE-OPERATIVE DIAGNOSIS:  cholecystitis  POST-OPERATIVE DIAGNOSIS:  Acute on chronic cholecystitis  PROCEDURE:  Procedure(s): LAPAROSCOPIC CHOLECYSTECTOMY WITH INTRAOPERATIVE CHOLANGIOGRAM Laparoscopic lysis adhesions x30 mintutes (1/2 case)  SURGEON:  Surgeon(s): Adin Hector, MD  ASSISTANT: RN   ANESTHESIA:   local and general  EBL:  Total I/O In: 1000 [I.V.:1000] Out: 400 [Urine:400]  Delay start of Pharmacological VTE agent (>24hrs) due to surgical blood loss or risk of bleeding:  no  DRAINS: none   SPECIMEN:  Source of Specimen:  Gallbladder  DISPOSITION OF SPECIMEN:  PATHOLOGY  COUNTS:  YES  PLAN OF CARE: Admit to inpatient   PATIENT DISPOSITION:  PACU - hemodynamically stable.  INDICATION: Pleasant male Jehovah's Witness with classic story of biliary colic now constant.  Physical exam and ultrasound concern for cholecystitis.  We recommended cholecystectomy:  The anatomy & physiology of hepatobiliary & pancreatic function was discussed.  The pathophysiology of gallbladder dysfunction was discussed.  Natural history risks without surgery was discussed.   I feel the risks of no intervention will lead to serious problems that outweigh the operative risks; therefore, I recommended cholecystectomy to remove the pathology.  I explained laparoscopic techniques with possible need for an open approach.  Probable cholangiogram to evaluate the bilary tract was explained as well.    Risks such as bleeding, infection, abscess, leak, injury to other organs, need for further treatment, heart attack, death, and other risks were discussed.  I noted a good likelihood this will help address the problem.  Possibility that this will not correct all abdominal symptoms was explained.  Goals of post-operative recovery were discussed as  well.  We will work to minimize complications.  An educational handout further explaining the pathology and treatment options was given as well.  Questions were answered.  The patient expresses understanding & wishes to proceed with surgery.  OR FINDINGS: Significant adhesions right upper quadrant inflammatory to liver and gallbladder consistent with acute on chronic cholecystitis.  Gallbladder full of thickened paste  Narrowed biliary system but no evidence of obstruction.  Classic anatomy  DESCRIPTION:  The patient was identified & brought into the operating room. The patient was positioned supine with arms tucked. SCDs were active during the entire case. The patient underwent general anesthesia without any difficulty.  The abdomen was prepped and draped in a sterile fashion. A Surgical Timeout confirmed our plan.  We positioned the patient in reverse [place] & right side up.  I placed a 3mm laparoscopic port through the abdominal wall using optical entry technique in the right upper quadrant.  Entry was clean.  We induced carbon dioxide insufflation. Camera inspection revealed no injury.  There were no adhesions to the anterior abdominal wall supraumbilically.  I proceeded to continue with laparoscopic technique. I placed a #5 port in supraumbilical region, another 1mm port in the right flank near the anterior axillary line, and a 17mm port in the left subxiphoid region obliquely within the falciform ligament.  I turned attention to the right upper quadrant.  The patient had moderate adhesions of greater omentum in the right upper quadrant.  And carefully freed this off using blunt Hydro dissection and focused hook cautery.  Occasional sharp scissors.  3 adhesions on the liver as well to help mobilize the gallbladder.  The gallbladder could not be grasped as  it was acutely distended.  Needle aspiration was done to release thickened mildly milky bowel bile.  The gallbladder fundus was elevated  cephalad. I used hook cautery to free the peritoneal coverings between the gallbladder and the liver on the posteriolateral and anteriomedial walls.   I used careful blunt and hook dissection to help get a good critical view of the cystic artery and cystic duct. I did further dissection to free a few centimeters of the  gallbladder off the liver bed to get a good critical view of the infundibulum and cystic duct. I mobilized the cystic artery; and, after getting a good 360 view, ligated the cystic artery using clips. I skeletonized the cystic duct.  I placed a clip on the infundibulum. I did a partial cystic duct-otomy and ensured patency. I placed a 5 [place] cholangiocatheter through a puncture site at the right subcostal ridge of the abdominal wall and directed it into the cystic duct.  We ran a cholangiogram with dilute radio-opaque contrast and continuous fluoroscopy. Contrast flowed from a side branch consistent with cystic duct cannulization. Contrast flowed up the common hepatic duct into the right and left intrahepatic chains out to secondary radicals. Contrast flowed down the common bile duct easily across the normal ampulla into the duodenum.  This was consistent with a normal cholangiogram.  I removed the cholangiocatheter. I placed clips on the cystic duct x3.  I completed cystic duct transection. Because the cystic duct was a somewhat edematous and thickened and tissue quality was poor, I further ligated the stump with a 0 PDS Endoloop.  I freed the gallbladder from its remaining attachments to the liver. I ensured hemostasis on the gallbladder fossa of the liver and elsewhere. I inspected the rest of the abdomen & detected no injury nor bleeding elsewhere.  I removed the gallbladder Inside and sterile bag to avoid contamination.  I have been opened up and the subxiphoid fascia to release the thickened gallbladder x1.5cm. I closed the subxiphoid fascia transversely using 0 Vicryl interrupted  stitches.  I closed the skin using 4-0 monocryl stitch.  Sterile dressings were applied. The patient was extubated & arrived in the PACU in stable condition..  I had discussed postoperative care with the patient in the holding area.  I am about to locate the patient's family and discuss operative findings and postoperative goals / instructions.  Instructions are written in the chart as well.

## 2013-12-07 NOTE — ED Provider Notes (Signed)
CSN: [id number]     Arrival date & time 12/06/13  2222 History   First MD Initiated Contact with Patient 12/07/13 [phone]     Chief Complaint  Patient presents with  . Abdominal Pain   (Consider location/radiation/quality/duration/timing/severity/associated sxs/prior Treatment) HPI History provided by patient. Right flank and right upper quadrant abdominal pain onset tonight. Pain is sharp in quality and moderate to severe radiates to his back. No hematuria. No dysuria. No fevers or chills. Has associated nausea. No vomiting or diarrhea. No history of same. Past Medical History  Diagnosis Date  . Obesity   . HTN (hypertension)   . BPH (benign prostatic hypertrophy)   . Seizures     AS A CHILD.  [name] Ulcer   . Sleep apnea     sleep study, produced known sleep apnea, pt. has not gotten device yet, wants to get his neck fixed 1st   . Hearing deficit     wears hearing aids bilateral  . GERD (gastroesophageal reflux disease)     reports for indigestion he uses mustard   . Arthritis     hands, knees, cervical area  . Essential tremor   . Anxiety    Past Surgical History  Procedure Laterality Date  . Tonsillectomy    . Eye surgery      right, growth excision  . Colonoscopy    . Vasectomy    . Anterior cervical decomp/discectomy fusion N/A 03/30/2013    Procedure: ANTERIOR CERVICAL DECOMPRESSION/DISCECTOMY FUSION 2 LEVELS;  Surgeon: Otilio Connors, MD;  Location: [facility];  Service: Neurosurgery;  Laterality: N/A;  C4-5 C5-6 Anterior cervical decompression/diskectomy/fusion/Allograft/Plate  . Spine surgery     Family History  Problem Relation Age of Onset  . Colon cancer Maternal Grandfather   . Diabetes Paternal Aunt     x 4 aunts  . Kidney disease Brother   . Stroke Maternal Grandmother    History  Substance Use Topics  . Smoking status: Former Smoker    Types: Cigarettes    Quit date: 12/14/1972  . Smokeless tobacco: Never Used  . Alcohol Use: Yes     Comment: social     Review of Systems  Constitutional: Negative for fever and chills.  Respiratory: Negative for shortness of breath.   Cardiovascular: Negative for chest pain.  Gastrointestinal: Positive for abdominal pain.  Genitourinary: Negative for hematuria.  Musculoskeletal: Negative for back pain, neck pain and neck stiffness.  Skin: Negative for rash.  Neurological: Negative for headaches.  All other systems reviewed and are negative.    Allergies  Ampicillin and Hydrocodone  Home Medications   Current Outpatient Rx  Name  Route  Sig  Dispense  Refill  . acetaminophen (TYLENOL) 500 MG tablet   Oral   Take 500-1,000 mg by mouth every 6 (six) hours as needed for mild pain or headache.         . losartan-hydrochlorothiazide (HYZAAR) 50-12.5 MG per tablet   Oral   Take 1 tablet by mouth every morning.         . meloxicam (MOBIC) 7.5 MG tablet   Oral   Take 1 tablet (7.5 mg total) by mouth daily. May take 2 if necessary   60 tablet   3   . primidone (MYSOLINE) 50 MG tablet   Oral   Take 100 mg by mouth at bedtime.         . Tamsulosin HCl (FLOMAX) 0.4 MG CAPS   Oral   Take 0.4  mg by mouth at bedtime.           BP 132/62  Pulse 101  Temp(Src) 98.4 F (36.9 C) (Oral)  Resp 18  Ht 5\' 4"  (1.626 m)  Wt 214 lb (97.07 kg)  BMI 36.72 kg/m2  SpO2 96% Physical Exam  Constitutional: He is oriented to person, place, and time. He appears well-developed and well-nourished.  HENT:  Head: Normocephalic and atraumatic.  Eyes: EOM are normal. Pupils are equal, round, and reactive to light. No scleral icterus.  Neck: Neck supple.  Cardiovascular: Regular rhythm and intact distal pulses.   Pulmonary/Chest: Effort normal. No respiratory distress.  Abdominal: Soft. He exhibits no mass.  Tender right flank and right upper quadrant with some guarding. No abdominal tenderness otherwise. No CVA tenderness  Musculoskeletal: Normal range of motion. He exhibits no edema.   Neurological: He is alert and oriented to person, place, and time.  Skin: Skin is warm and dry.    ED Course  Procedures (including critical care time) Labs Review Labs Reviewed  CBC - Abnormal; Notable for the following:    WBC 10.8 (*)    HCT 38.9 (*)    All other components within normal limits  COMPREHENSIVE METABOLIC PANEL - Abnormal; Notable for the following:    Glucose, Bld 137 (*)    Alkaline Phosphatase 139 (*)    Total Bilirubin 0.2 (*)    GFR calc non Af Amer 84 (*)    All other components within normal limits  URINALYSIS, ROUTINE W REFLEX MICROSCOPIC  LIPASE, BLOOD   Imaging Review Ct Abdomen Pelvis Wo Contrast  12/07/2013   CLINICAL DATA:  Right flank pain, difficulty urinating.  EXAM: CT ABDOMEN AND PELVIS WITHOUT CONTRAST  TECHNIQUE: Multidetector CT imaging of the abdomen and pelvis was performed following the standard protocol without intravenous contrast.  COMPARISON:  CT of the chest, abdomen and pelvis Apr 06, 2013.  FINDINGS: Improved aeration of the lung bases, with trace dependent atelectasis on the right. Included heart and pericardium are unremarkable.  Kidneys are well located without nephrolithiasis, hydronephrosis. Limited assessment for renal masses on this nonenhanced examination. The ureters are normal in course and caliber, no urolithiasis. Urinary bladder is partially distended, harboring no intravesicular calculi. Phleboliths in the pelvis. Prostate is 4.2 cm in transaxial dimension with prostatic calcifications.  The liver, spleen, pancreas and adrenal glands are unremarkable for this nonenhanced examination. Mildly heterogeneous dependent presumed gallstones without superimposed CT findings of acute cholecystitis.  Stomach, small and large bowel are normal in course and caliber without inflammatory changes. Extensive diverticulosis from the cecum to the sigmoid colon. Normal air-filled appendix. No intraperitoneal free fluid nor free air. Suspected small  duodenal diverticulum on axial 26/78, unchanged.  Great vessels are normal in course and caliber with mild calcific atherosclerosis. Soft tissues are nonsuspicious. Moderate to severe lumbar degenerative change, with severe L5-S1 neural foraminal narrowing. Mild scoliosis.  IMPRESSION: No urolithiasis.  No acute intra-abdominal or pelvic process.  Cholelithiasis without CT findings of acute cholecystitis.  Diverticulosis without CT findings of acute diverticulitis.   Electronically Signed   By: Elon Alas   On: 12/07/2013 02:48   US Abdomen Complete  12/07/2013   CLINICAL DATA:  Right upper quadrant pain  EXAM: ULTRASOUND ABDOMEN COMPLETE  COMPARISON:  12/07/2013 CT  FINDINGS: Gallbladder:  Multiple gallstones. Thickened gallbladder wall at about the 7 mm. No appreciable sonographic Murphy sign.  Common bile duct:  Diameter: 3 mm, within normal limits where seen.  Liver:  No focal lesion identified. Question mild increased echogenicity as can be seen with steatosis.  IVC:  No abnormality visualized.  Pancreas:  Obscured by overlying bowel gas artifact.  Spleen:  Measures 7 cm oblique.  No focal abnormality.  Right Kidney:  Length: 11.2 cm. Echogenicity within normal limits. No mass or hydronephrosis visualized.  Left Kidney:  Length: 11.0 cm. Echogenicity within normal limits. No mass or hydronephrosis visualized.  Abdominal aorta:  No aneurysm visualized.  Other findings:  None.  IMPRESSION: Cholelithiasis and gallbladder wall thickening. May reflect chronic or acute on chronic cholecystitis in the appropriate clinical setting. Correlate clinically and with HIDA scan if warranted.  Question mild hepatic steatosis   Electronically Signed   By: Carlos Levering M.D.   On: 12/07/2013 03:30    EKG Interpretation   None      5:58 AM patient took a few sips of water and has return of abdominal pain. General surgery consult it and Dr. Grandville Silos will evaluate in the ED MDM  Diagnosis: Symptomatic  gallstones  Intermittent pain control with IV narcotics Evaluated with labs and imaging as above - gallstones on ultrasound questionable chronic versus acute on chronic cholecystitis. General surgery to evaluate bedside  Teressa Lower, MD 12/07/13 [phone]

## 2013-12-07 NOTE — Transfer of Care (Signed)
Immediate Anesthesia Transfer of Care Note  Patient: Bryan [name]  Procedure(s) Performed: Procedure(s): LAPAROSCOPIC CHOLECYSTECTOMY WITH INTRAOPERATIVE CHOLANGIOGRAM (N/A)  Patient Location: PACU  Anesthesia Type:General  Level of Consciousness: sedated  Airway & Oxygen Therapy: Patient Spontanous Breathing and Patient connected to face mask oxygen  Post-op Assessment: Report given to PACU RN and Post -op Vital signs reviewed and stable  Post vital signs: Reviewed and stable  Complications: No apparent anesthesia complications

## 2013-12-07 NOTE — Preoperative (Signed)
Beta Blockers   Reason not to administer Beta Blockers:Not Applicable 

## 2013-12-07 NOTE — Progress Notes (Signed)
Note: This dictation was prepared with Dragon/digital dictation along with [employer]. Any transcriptional errors that result from this process are unintentional.       Bryan [name]  1946/01/04 [id number]  Patient Care Team: Pearline Cables, MD as PCP - General (Family Medicine)  This patient is a 68 y.o.male who presents today for surgical evaluation   Actually I'm available to cover for Dr. Donell Beers.  He is interested in proceeding with surgery now. I have reviewed the history and physical & performed my own.   Classic story of biliary colic with tenderness in right upper abdomen.  CT scan not as impressive but ultrasound later suspicious for cholecystitis. Rest of differential diagnosis seems less likely.  Jehovah's Witness and refuses blood products.  Patient Active Problem List   Diagnosis Date Noted  . Acute cholecystitis 12/07/2013  . Obesity, unspecified 06/05/2013  . Rheumatoid factor positive 10/26/2012  . Nonspecific abnormal finding in stool contents 07/28/2012  . Multiple joint pain 06/05/2012  . History of tobacco use-  06/05/2012  . Asthmatic bronchitis 12/15/2011  . BPH (benign prostatic hyperplasia) 12/15/2011  . Essential tremor 12/15/2011  . HTN (hypertension) 12/15/2011    Past Medical History  Diagnosis Date  . Obesity   . HTN (hypertension)   . BPH (benign prostatic hypertrophy)   . Seizures     AS A CHILD.  [name] Ulcer   . Sleep apnea     sleep study, produced known sleep apnea, pt. has not gotten device yet, wants to get his neck fixed 1st   . Hearing deficit     wears hearing aids bilateral  . GERD (gastroesophageal reflux disease)     reports for indigestion he uses mustard   . Arthritis     hands, knees, cervical area  . Essential tremor   . Anxiety     Past Surgical History  Procedure Laterality Date  . Tonsillectomy    . Eye surgery      right, growth excision  . Colonoscopy    . Vasectomy    . Anterior cervical  decomp/discectomy fusion N/A 03/30/2013    Procedure: ANTERIOR CERVICAL DECOMPRESSION/DISCECTOMY FUSION 2 LEVELS;  Surgeon: Clydene Fake, MD;  Location: [facility];  Service: Neurosurgery;  Laterality: N/A;  C4-5 C5-6 Anterior cervical decompression/diskectomy/fusion/Allograft/Plate  . Spine surgery    . Wrist ganglion excision Left     History   Social History  . Marital Status: Married    Spouse Name: N/A    Number of Children: 2  . Years of Education: N/A   Occupational History  . bus driver    Social History Main Topics  . Smoking status: Former Smoker    Types: Cigarettes    Quit date: 12/14/1972  . Smokeless tobacco: Never Used  . Alcohol Use: Yes     Comment: social  . Drug Use: No  . Sexual Activity: Yes    Birth Control/ Protection: None   Other Topics Concern  . Not on file   Social History Narrative  . No narrative on file    Family History  Problem Relation Age of Onset  . Colon cancer Maternal Grandfather   . Diabetes Paternal Aunt     x 4 aunts  . Kidney disease Brother   . Stroke Maternal Grandmother     Current Facility-Administered Medications  Medication Dose Route Frequency Provider Last Rate Last Dose  . 0.45 % NaCl with KCl 20 mEq / L infusion  Intravenous Continuous Henreitta Cea, PA-C      . [facility] HOLD] acetaminophen (TYLENOL) tablet 650 mg  650 mg Oral Q6H PRN Henreitta Cea, PA-C       Or  . [facility] HOLD] acetaminophen (TYLENOL) suppository 650 mg  650 mg Rectal Q6H PRN Henreitta Cea, PA-C      . [facility] HOLD] ciprofloxacin (CIPRO) IVPB 400 mg  400 mg Intravenous Q12H Henreitta Cea, PA-C   400 mg at 12/07/13 [id number]  . [facility] HOLD] diphenhydrAMINE (BENADRYL) injection 12.5 mg  12.5 mg Intravenous Q6H PRN Henreitta Cea, PA-C       Or  . [facility] HOLD] diphenhydrAMINE (BENADRYL) 12.5 MG/5ML elixir 12.5 mg  12.5 mg Oral Q6H PRN Henreitta Cea, PA-C      . [[facility] HOLD] hydrochlorothiazide (MICROZIDE) capsule 12.5 mg  12.5 mg Oral Daily Gaye Alken Borgerding, RPH   12.5 mg at 12/07/13 0913  . HYDROmorphone (DILAUDID) injection 0.25-0.5 mg  0.25-0.5 mg Intravenous Q5 min PRN Myrtie Soman, MD      . ketorolac (TORADOL) 30 MG/ML injection 15-30 mg  15-30 mg Intravenous Once PRN Myrtie Soman, MD      . lactated ringers infusion   Intravenous Continuous Myrtie Soman, MD 100 mL/hr at 12/07/13 1400 1,000 mL at 12/07/13 1400  . [[facility] HOLD] losartan (COZAAR) tablet 50 mg  50 mg Oral Daily Gaye Alken Borgerding, RPH   50 mg at 12/07/13 0913  . [facility] HOLD] morphine 2 MG/ML injection 1-4 mg  1-4 mg Intravenous Q2H PRN Henreitta Cea, PA-C   1 mg at 12/07/13 0916  . [MAR HOLD] ondansetron (ZOFRAN) injection 4 mg  4 mg Intravenous Q6H PRN Henreitta Cea, PA-C      . [facility] HOLD] oxyCODONE (Oxy IR/ROXICODONE) immediate release tablet 5-10 mg  5-10 mg Oral Q4H PRN Henreitta Cea, PA-C      . [[facility] HOLD] pantoprazole (PROTONIX) injection 40 mg  40 mg Intravenous QHS Henreitta Cea, PA-C      . [[facility] HOLD] primidone (MYSOLINE) tablet 100 mg  100 mg Oral QHS Henreitta Cea, PA-C      . promethazine (PHENERGAN) injection 6.25-12.5 mg  6.25-12.5 mg Intravenous Q15 min PRN Myrtie Soman, MD      . tamsulosin [facility]) capsule 0.4 mg  0.4 mg Oral QHS Henreitta Cea, PA-C         Allergies  Allergen Reactions  . Ampicillin Nausea Only  . Hydrocodone Other (See Comments)    Hallucinations     BP 109/88  Pulse 80  Temp(Src) 98.7 F (37.1 C) (Oral)  Resp 16  Ht 5\' 4"  (1.626 m)  Wt 214 lb (97.07 kg)  BMI 36.72 kg/m2  SpO2 95%  General: Pt awake/alert/oriented x4 in mild acute distress but consolable Eyes: PERRL, normal EOM. Sclera nonicteric Neuro: CN II-XII intact w/o focal sensory/motor deficits. Lymph: No head/neck/groin lymphadenopathy Psych:  No delerium/psychosis/paranoia HENT: Normocephalic, Mucus membranes moist.  No thrush Neck: Supple, No tracheal deviation Chest: No pain.  Good respiratory excursion. CV:  Pulses intact.  Regular  rhythm MS: Normal AROM mjr joints.  No obvious deformity.  Back sore Abdomen: Obese but Soft, Nondistended.  TTP RUQ/upper abdomen.  O/w  nontender.  No incarcerated hernias. Ext:  SCDs BLE.  No significant edema.  No cyanosis Skin: No petechiae / purpura   Ct Abdomen Pelvis Wo Contrast  12/07/2013   CLINICAL DATA:  Right flank pain, difficulty urinating.  EXAM:  CT ABDOMEN AND PELVIS WITHOUT CONTRAST  TECHNIQUE: Multidetector CT imaging of the abdomen and pelvis was performed following the standard protocol without intravenous contrast.  COMPARISON:  CT of the chest, abdomen and pelvis Apr 06, 2013.  FINDINGS: Improved aeration of the lung bases, with trace dependent atelectasis on the right. Included heart and pericardium are unremarkable.  Kidneys are well located without nephrolithiasis, hydronephrosis. Limited assessment for renal masses on this nonenhanced examination. The ureters are normal in course and caliber, no urolithiasis. Urinary bladder is partially distended, harboring no intravesicular calculi. Phleboliths in the pelvis. Prostate is 4.2 cm in transaxial dimension with prostatic calcifications.  The liver, spleen, pancreas and adrenal glands are unremarkable for this nonenhanced examination. Mildly heterogeneous dependent presumed gallstones without superimposed CT findings of acute cholecystitis.  Stomach, small and large bowel are normal in course and caliber without inflammatory changes. Extensive diverticulosis from the cecum to the sigmoid colon. Normal air-filled appendix. No intraperitoneal free fluid nor free air. Suspected small duodenal diverticulum on axial 26/78, unchanged.  Great vessels are normal in course and caliber with mild calcific atherosclerosis. Soft tissues are nonsuspicious. Moderate to severe lumbar degenerative change, with severe L5-S1 neural foraminal narrowing. Mild scoliosis.  IMPRESSION: No urolithiasis.  No acute intra-abdominal or pelvic process.   Cholelithiasis without CT findings of acute cholecystitis.  Diverticulosis without CT findings of acute diverticulitis.   Electronically Signed   By: Elon Alas   On: 12/07/2013 02:48   US Abdomen Complete  12/07/2013   CLINICAL DATA:  Right upper quadrant pain  EXAM: ULTRASOUND ABDOMEN COMPLETE  COMPARISON:  12/07/2013 CT  FINDINGS: Gallbladder:  Multiple gallstones. Thickened gallbladder wall at about the 7 mm. No appreciable sonographic Murphy sign.  Common bile duct:  Diameter: 3 mm, within normal limits where seen.  Liver:  No focal lesion identified. Question mild increased echogenicity as can be seen with steatosis.  IVC:  No abnormality visualized.  Pancreas:  Obscured by overlying bowel gas artifact.  Spleen:  Measures 7 cm oblique.  No focal abnormality.  Right Kidney:  Length: 11.2 cm. Echogenicity within normal limits. No mass or hydronephrosis visualized.  Left Kidney:  Length: 11.0 cm. Echogenicity within normal limits. No mass or hydronephrosis visualized.  Abdominal aorta:  No aneurysm visualized.  Other findings:  None.  IMPRESSION: Cholelithiasis and gallbladder wall thickening. May reflect chronic or acute on chronic cholecystitis in the appropriate clinical setting. Correlate clinically and with HIDA scan if warranted.  Question mild hepatic steatosis   Electronically Signed   By: Carlos Levering M.D.   On: 12/07/2013 03:30   Dg Chest Port 1 View  12/07/2013   CLINICAL DATA:  Preop for cholecystectomy  EXAM: PORTABLE CHEST - 1 VIEW  COMPARISON:  04/06/2013  FINDINGS: Cardiomediastinal silhouette is stable. No acute infiltrate or pleural effusion. No pulmonary edema. Metallic fixation plate cervical spine again noted.  IMPRESSION: No active disease.   Electronically Signed   By: Lahoma Crocker M.D.   On: 12/07/2013 09:10     I agree with the recommendation of urgent cholecystectomy.  I did discuss the procedure with him. He wishes to proceed:  The anatomy & physiology of  hepatobiliary & pancreatic function was discussed.  The pathophysiology of gallbladder dysfunction was discussed.  Natural history risks without surgery was discussed.   I feel the risks of no intervention will lead to serious problems that outweigh the operative risks; therefore, I recommended cholecystectomy to remove the pathology.  I explained laparoscopic  techniques with possible need for an open approach.  Probable cholangiogram to evaluate the bilary tract was explained as well.    Risks such as bleeding, infection, abscess, leak, injury to other organs, need for further treatment, heart attack, death, and other risks were discussed.  I noted a good likelihood this will help address the problem.  Possibility that this will not correct all abdominal symptoms was explained.  Goals of post-operative recovery were discussed as well.  We will work to minimize complications.  An educational handout further explaining the pathology and treatment options was given as well.  Questions were answered.  The patient expresses understanding & wishes to proceed with surgery.

## 2013-12-07 NOTE — Progress Notes (Signed)
He is listed as being on Flomax and allergic to Flomax, pt says he is allergic to Flomax.  i called the [facility] and they do not have him listed as being on anything else for his BPH. Surgery canceled for today, current case taking longer than expected.  Plan for tomorrow for surgery.

## 2013-12-07 NOTE — Anesthesia Preprocedure Evaluation (Signed)
Anesthesia Evaluation  Patient identified by MRN, date of birth, ID band Patient awake    Reviewed: Allergy & Precautions, H&P , NPO status , Patient's Chart, lab work & pertinent test results  Airway Mallampati: II  TM Distance: <3 FB Neck ROM: Full    Dental no notable dental hx.    Pulmonary neg pulmonary ROS, former smoker,  breath sounds clear to auscultation  Pulmonary exam normal       Cardiovascular hypertension, Pt. on medications Rhythm:Regular Rate:Normal     Neuro/Psych negative neurological ROS  negative psych ROS   GI/Hepatic negative GI ROS, Neg liver ROS,   Endo/Other  Morbid obesity  Renal/GU negative Renal ROS  negative genitourinary   Musculoskeletal negative musculoskeletal ROS (+)   Abdominal   Peds negative pediatric ROS (+)  Hematology negative hematology ROS (+)   Anesthesia Other Findings   Reproductive/Obstetrics negative OB ROS                            Anesthesia Physical Anesthesia Plan  ASA: II  Anesthesia Plan: General   Post-op Pain Management:    Induction: Intravenous  Airway Management Planned: Oral ETT  Additional Equipment:   Intra-op Plan:   Post-operative Plan: Extubation in OR  Informed Consent: I have reviewed the patients History and Physical, chart, labs and discussed the procedure including the risks, benefits and alternatives for the proposed anesthesia with the patient or authorized representative who has indicated his/her understanding and acceptance.   Dental advisory given  Plan Discussed with: CRNA and Surgeon  Anesthesia Plan Comments:         Anesthesia Quick Evaluation  

## 2013-12-07 NOTE — Progress Notes (Signed)
Surgery Canceled per surgeon.  Patient IV running at [facility].  Patient to go to [place] and surgery will be placed back on schedule for 12/08/2013 per Dr Barry Dienes MD.  Patient informed of Information listed above.

## 2013-12-07 NOTE — H&P (Signed)
Pt with cholecystitis.  Dr. Johney Maine to eval for OR tonight.

## 2013-12-07 NOTE — H&P (Signed)
Bryan [name] [birth year]  [id number].   Primary Care MD: Dr. Silvestre Mesi Chief Complaint/Reason for Consult: RUQ abdominal pain HPI: This is a pleasant 68 yo black male with a h/o HTN, RA, and BPH who began having RUQ abdominal pain last night around 2000.  He had a tuna fish sandwich and potato soup for supper.  He has never had pain like this before, only Reflux type symptoms.  He had some nausea, but no emesis.  He denies diarrhea or fever.  He presented to the [facility] where he had an ultrasound that revealed possible acute cholecystitis.  His WBC was 10.8 and LFTs were essentially normal except alk phos was 137.  Due to persistent pain, we were asked to see him for admission.  ROSPlease see HPI, otherwise all other systems have been reviewed and are negative.    Family History  Problem Relation Age of Onset  . Colon cancer Maternal Grandfather   . Diabetes Paternal Aunt     x 4 aunts  . Kidney disease Brother   . Stroke Maternal Grandmother     Past Medical History  Diagnosis Date  . Obesity   . HTN (hypertension)   . BPH (benign prostatic hypertrophy)   . Seizures     AS A CHILD.  [name] Ulcer   . Sleep apnea     sleep study, produced known sleep apnea, pt. has not gotten device yet, wants to get his neck fixed 1st   . Hearing deficit     wears hearing aids bilateral  . GERD (gastroesophageal reflux disease)     reports for indigestion he uses mustard   . Arthritis     hands, knees, cervical area  . Essential tremor   . Anxiety     Past Surgical History  Procedure Laterality Date  . Tonsillectomy    . Eye surgery      right, growth excision  . Colonoscopy    . Vasectomy    . Anterior cervical decomp/discectomy fusion N/A 03/30/2013    Procedure: ANTERIOR CERVICAL DECOMPRESSION/DISCECTOMY FUSION 2 LEVELS;  Surgeon: Otilio Connors, MD;  Location: [place] [facility];  Service: Neurosurgery;  Laterality: N/A;  C4-5 C5-6 Anterior cervical  decompression/diskectomy/fusion/Allograft/Plate  . Spine surgery    . Wrist ganglion excision Left     Social History:  reports that he quit smoking about 41 years ago. His smoking use included Cigarettes. He smoked 0.00 packs per day. He has never used smokeless tobacco. He reports that he drinks alcohol. He reports that he does not use illicit drugs.  Allergies:  Allergies  Allergen Reactions  . Ampicillin Nausea Only  . Flomax [Tamsulosin Hcl] Nausea And Vomiting  . Hydrocodone Other (See Comments)    Hallucinations   OF NOTE, THE PATIENT STATES THAT HE NO LONGER TAKES FLOMAX EVEN THOUGH IT IS LISTED AS A MEDICATION.  HE TAKES SOMETHING THAT STARTS WITH A "C" OR "S".  HE STATES FLOMAX CAUSES NAUSEA.  PHARMACY DOES NOT HAVE A MEDICATION LISTED FOR BPH.  HIS WIFE SHOULD BE BRINGING HIS BOTTLE IN SO WE CAN VERIFY WHAT HE IS ACTUALLY TAKING.   (Not in a hospital admission)  Blood pressure 172/78, pulse 92, temperature 98.4 F (36.9 C), temperature source Oral, resp. rate 18, height $RemoveBe'5\' 4"'[id number]$  (1.626 m), weight 214 lb (97.07 kg), SpO2 88.00%. Physical Exam: General: pleasant, WD, WN black male who is laying in bed in NAD HEENT: head is normocephalic, atraumatic.  Sclera are noninjected.  PERRL.  Ears and nose without any masses or lesions.  Mouth is pink and moist Heart: regular, rate, and rhythm.  Normal s1,s2. No obvious murmurs, gallops, or rubs noted.  Palpable radial and pedal pulses bilaterally Lungs: CTAB, no wheezes, rhonchi, or rales noted.  Respiratory effort nonlabored Abd: soft, tender in RUQ with + Murphy's sign, ND, +BS, no masses, hernias, or organomegaly MS: all 4 extremities are symmetrical with no cyanosis or clubbing.  He has +1-2 pitting edema in his lower extremities Skin: warm and dry with no masses, lesions, or rashes Psych: A&Ox3 with an appropriate affect.   Results for orders placed during the hospital encounter of 12/06/13 (from the past 48 hour(s))  URINALYSIS,  ROUTINE W REFLEX MICROSCOPIC     Status: None   Collection Time    12/06/13 10:55 PM      Result Value Range   Color, Urine YELLOW  YELLOW   APPearance CLEAR  CLEAR   Specific Gravity, Urine 1.018  1.005 - 1.030   pH 7.0  5.0 - 8.0   Glucose, UA NEGATIVE  NEGATIVE mg/dL   Hgb urine dipstick NEGATIVE  NEGATIVE   Bilirubin Urine NEGATIVE  NEGATIVE   Ketones, ur NEGATIVE  NEGATIVE mg/dL   Protein, ur NEGATIVE  NEGATIVE mg/dL   Urobilinogen, UA 0.2  0.0 - 1.0 mg/dL   Nitrite NEGATIVE  NEGATIVE   Leukocytes, UA NEGATIVE  NEGATIVE   Comment: MICROSCOPIC NOT DONE ON URINES WITH NEGATIVE PROTEIN, BLOOD, LEUKOCYTES, NITRITE, OR GLUCOSE <1000 mg/dL.  CBC     Status: Abnormal   Collection Time    12/07/13  1:49 AM      Result Value Range   WBC 10.8 (*) 4.0 - 10.5 K/uL   RBC 4.34  4.22 - 5.81 MIL/uL   Hemoglobin 13.0  13.0 - 17.0 g/dL   HCT 34.4 (*) 83.0 - 15.9 %   MCV 89.6  78.0 - 100.0 fL   MCH 30.0  26.0 - 34.0 pg   MCHC 33.4  30.0 - 36.0 g/dL   RDW 96.8  95.7 - 02.2 %   Platelets 265  150 - 400 K/uL  COMPREHENSIVE METABOLIC PANEL     Status: Abnormal   Collection Time    12/07/13  1:49 AM      Result Value Range   Sodium 138  137 - 147 mEq/L   Potassium 3.7  3.7 - 5.3 mEq/L   Chloride 98  96 - 112 mEq/L   CO2 26  19 - 32 mEq/L   Glucose, Bld 137 (*) 70 - 99 mg/dL   BUN 14  6 - 23 mg/dL   Creatinine, Ser 0.26  0.50 - 1.35 mg/dL   Calcium 8.9  8.4 - 69.1 mg/dL   Total Protein 8.0  6.0 - 8.3 g/dL   Albumin 3.6  3.5 - 5.2 g/dL   AST 20  0 - 37 U/L   ALT 14  0 - 53 U/L   Alkaline Phosphatase 139 (*) 39 - 117 U/L   Total Bilirubin 0.2 (*) 0.3 - 1.2 mg/dL   GFR calc non Af Amer 84 (*) >90 mL/min   GFR calc Af Amer >90  >90 mL/min   Comment: (NOTE)     The eGFR has been calculated using the CKD EPI equation.     This calculation has not been validated in all clinical situations.     eGFR's persistently <90 mL/min signify possible Chronic Kidney     Disease.  LIPASE,  BLOOD      Status: None   Collection Time    12/07/13  1:49 AM      Result Value Range   Lipase 22  11 - 59 U/L   Ct Abdomen Pelvis Wo Contrast  12/07/2013   CLINICAL DATA:  Right flank pain, difficulty urinating.  EXAM: CT ABDOMEN AND PELVIS WITHOUT CONTRAST  TECHNIQUE: Multidetector CT imaging of the abdomen and pelvis was performed following the standard protocol without intravenous contrast.  COMPARISON:  CT of the chest, abdomen and pelvis Apr 06, 2013.  FINDINGS: Improved aeration of the lung bases, with trace dependent atelectasis on the right. Included heart and pericardium are unremarkable.  Kidneys are well located without nephrolithiasis, hydronephrosis. Limited assessment for renal masses on this nonenhanced examination. The ureters are normal in course and caliber, no urolithiasis. Urinary bladder is partially distended, harboring no intravesicular calculi. Phleboliths in the pelvis. Prostate is 4.2 cm in transaxial dimension with prostatic calcifications.  The liver, spleen, pancreas and adrenal glands are unremarkable for this nonenhanced examination. Mildly heterogeneous dependent presumed gallstones without superimposed CT findings of acute cholecystitis.  Stomach, small and large bowel are normal in course and caliber without inflammatory changes. Extensive diverticulosis from the cecum to the sigmoid colon. Normal air-filled appendix. No intraperitoneal free fluid nor free air. Suspected small duodenal diverticulum on axial 26/78, unchanged.  Great vessels are normal in course and caliber with mild calcific atherosclerosis. Soft tissues are nonsuspicious. Moderate to severe lumbar degenerative change, with severe L5-S1 neural foraminal narrowing. Mild scoliosis.  IMPRESSION: No urolithiasis.  No acute intra-abdominal or pelvic process.  Cholelithiasis without CT findings of acute cholecystitis.  Diverticulosis without CT findings of acute diverticulitis.   Electronically Signed   By: Elon Alas    On: 12/07/2013 02:48   US Abdomen Complete  12/07/2013   CLINICAL DATA:  Right upper quadrant pain  EXAM: ULTRASOUND ABDOMEN COMPLETE  COMPARISON:  12/07/2013 CT  FINDINGS: Gallbladder:  Multiple gallstones. Thickened gallbladder wall at about the 7 mm. No appreciable sonographic Murphy sign.  Common bile duct:  Diameter: 3 mm, within normal limits where seen.  Liver:  No focal lesion identified. Question mild increased echogenicity as can be seen with steatosis.  IVC:  No abnormality visualized.  Pancreas:  Obscured by overlying bowel gas artifact.  Spleen:  Measures 7 cm oblique.  No focal abnormality.  Right Kidney:  Length: 11.2 cm. Echogenicity within normal limits. No mass or hydronephrosis visualized.  Left Kidney:  Length: 11.0 cm. Echogenicity within normal limits. No mass or hydronephrosis visualized.  Abdominal aorta:  No aneurysm visualized.  Other findings:  None.  IMPRESSION: Cholelithiasis and gallbladder wall thickening. May reflect chronic or acute on chronic cholecystitis in the appropriate clinical setting. Correlate clinically and with HIDA scan if warranted.  Question mild hepatic steatosis   Electronically Signed   By: Carlos Levering M.D.   On: 12/07/2013 03:30       Assessment/Plan 1. Acute cholecystitis 2. BPH 3. HTN 4. RA  Plan: 1. We will admit the patient and plan for lap chole today.  He will be started on IV Cipro.  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  I have informed the patient he will require a lap chole.  I have explained to him what this is along with possible risks and complications, including, but not limited to bile leak, abscess, injury to surrounding organs, injury to CBD, CBD stone, and need  to convert to open procedure.  We have also discussed the possibility of bleeding and infection.  We discussed recovery time and expected time in the hospital.  He and his wife understand and are willing to procedure with surgical  intervention. 2. Trying to determine what medication he takes for his BPH so we can continue this to hopefully help prevent any type of urinary retention post op. 3. Cont home meds for HTN  Jaslyn Bansal E 12/07/2013, 8:47 AM Pager: [phone]

## 2013-12-08 ENCOUNTER — Encounter (HOSPITAL_COMMUNITY): Payer: Self-pay | Admitting: Surgery

## 2013-12-08 MED ORDER — OXYCODONE HCL 5 MG PO TABS: 5.0000 mg | ORAL_TABLET | Freq: Four times a day (QID) | ORAL | Status: DC | PRN

## 2013-12-08 NOTE — Discharge Instructions (Signed)
LAPAROSCOPIC SURGERY: POST OP INSTRUCTIONS ° °1. DIET: Follow a light bland diet the first 24 hours after arrival home, such as soup, liquids, crackers, etc.  Be sure to include lots of fluids daily.  Avoid fast food or heavy meals as your are more likely to get nauseated.  Eat a low fat the next few days after surgery.   °2. Take your usually prescribed home medications unless otherwise directed. °3. PAIN CONTROL: °a. Pain is best controlled by a usual combination of three different methods TOGETHER: °i. Ice/Heat °ii. Over the counter pain medication °iii. Prescription pain medication °b. Most patients will experience some swelling and bruising around the incisions.  Ice packs or heating pads (30-60 minutes up to 6 times a day) will help. Use ice for the first few days to help decrease swelling and bruising, then switch to heat to help relax tight/sore spots and speed recovery.  Some people prefer to use ice alone, heat alone, alternating between ice & heat.  Experiment to what works for you.  Swelling and bruising can take several weeks to resolve.   °c. It is helpful to take an over-the-counter pain medication regularly for the first few weeks.  Choose one of the following that works best for you: °i. Naproxen (Aleve, etc)  Two 220mg tabs twice a day °ii. Ibuprofen (Advil, etc) Three 200mg tabs four times a day (every meal & bedtime) °iii. Acetaminophen (Tylenol, etc) 500-650mg four times a day (every meal & bedtime) °d. A  prescription for pain medication (such as oxycodone, hydrocodone, etc) should be given to you upon discharge.  Take your pain medication as prescribed.  °i. If you are having problems/concerns with the prescription medicine (does not control pain, nausea, vomiting, rash, itching, etc), please call us [phone] to see if we need to switch you to a different pain medicine that will work better for you and/or control your side effect better. °ii. If you need a refill on your pain medication,  please contact your pharmacy.  They will contact our office to request authorization. Prescriptions will not be filled after 5 pm or on week-ends. °4. Avoid getting constipated.  Between the surgery and the pain medications, it is common to experience some constipation.  Increasing fluid intake and taking a fiber supplement (such as Metamucil, Citrucel, FiberCon, MiraLax, etc) 1-2 times a day regularly will usually help prevent this problem from occurring.  A mild laxative (prune juice, Milk of Magnesia, MiraLax, etc) should be taken according to package directions if there are no bowel movements after 48 hours.   °5. Watch out for diarrhea.  If you have many loose bowel movements, simplify your diet to bland foods & liquids for a few days.  Stop any stool softeners and decrease your fiber supplement.  Switching to mild anti-diarrheal medications (Kayopectate, Pepto Bismol) can help.  If this worsens or does not improve, please call us. °6. Wash / shower every day.  You may shower over the dressings as they are waterproof.  Continue to shower over incision(s) after the dressing is off. °7. Remove your waterproof bandages 5 days after surgery.  You may leave the incision open to air.  You may replace a dressing/Band-Aid to cover the incision for comfort if you wish.  °8. ACTIVITIES as tolerated:   °a. You may resume regular (light) daily activities beginning the next day--such as daily self-care, walking, climbing stairs--gradually increasing activities as tolerated.  If you can walk 30 minutes without difficulty, it   is safe to try more intense activity such as jogging, treadmill, bicycling, low-impact aerobics, swimming, etc. b. Save the most intensive and strenuous activity for last such as sit-ups, heavy lifting, contact sports, etc  Refrain from any heavy lifting or straining until you are off narcotics for pain control.   c. DO NOT PUSH THROUGH PAIN.  Let pain be your guide: If it hurts to do something, don't  do it.  Pain is your body warning you to avoid that activity for another week until the pain goes down. d. You may drive when you are no longer taking prescription pain medication, you can comfortably wear a seatbelt, and you can safely maneuver your car and apply brakes. e. Dennis Bast may have sexual intercourse when it is comfortable.  9. FOLLOW UP in our office a. Please call [facility] at [phone] to set up an appointment to see your surgeon in the office for a follow-up appointment approximately 2-3 weeks after your surgery. b. Make sure that you call for this appointment the day you arrive home to insure a convenient appointment time. 10. IF YOU HAVE DISABILITY OR FAMILY LEAVE FORMS, BRING THEM TO THE OFFICE FOR PROCESSING.  DO NOT GIVE THEM TO YOUR DOCTOR.   WHEN TO CALL us [phone]: 1. Poor pain control 2. Reactions / problems with new medications (rash/itching, nausea, etc)  3. Fever over 101.5 F (38.5 C) 4. Inability to urinate 5. Nausea and/or vomiting 6. Worsening swelling or bruising 7. Continued bleeding from incision. 8. Increased pain, redness, or drainage from the incision   The clinic staff is available to answer your questions during regular business hours (8:30am-5pm).  Please dont hesitate to call and ask to speak to one of our nurses for clinical concerns.   If you have a medical emergency, go to the nearest emergency room or call 911.  A surgeon from [facility] is always on call at the [facility], [place], [place], [place], [place]  [address] ? MAIN: [phone] ? TOLL FREE: [phone] ?  FAX [phone] [email or web address]  Cholecystitis Cholecystitis is an inflammation of your gallbladder. It is usually caused by a buildup of gallstones or sludge (cholelithiasis) in your gallbladder. The gallbladder stores a fluid that helps digest fats (bile). Cholecystitis is serious and needs  treatment right away.  CAUSES   Gallstones. Gallstones can block the tube that leads to your gallbladder, causing bile to build up. As bile builds up, the gallbladder becomes inflamed.  Bile duct problems, such as blockage from scarring or kinking.  Tumors. Tumors can stop bile from leaving your gallbladder correctly, causing bile to build up. As bile builds up, the gallbladder becomes inflamed. SYMPTOMS   Nausea.  Vomiting.  Abdominal pain, especially in the upper right area of your abdomen.  Abdominal tenderness or bloating.  Sweating.  Chills.  Fever.  Yellowing of the skin and the whites of the eyes (jaundice). DIAGNOSIS  Your caregiver may order blood tests to look for infection or gallbladder problems. Your caregiver may also order imaging tests, such as an ultrasound or computed tomography (CT) scan. Further tests may include a hepatobiliary iminodiacetic acid (HIDA) scan. This scan allows your caregiver to see your bile move from the liver to the gallbladder and to the small intestine. TREATMENT  A hospital stay is usually necessary to lessen the inflammation of your gallbladder. You may be required to not eat or drink (fast) for a  certain amount of time. You may be given medicine to treat pain or an antibiotic medicine to treat an infection. Surgery may be needed to remove your gallbladder (cholecystectomy) once the inflammation has gone down. Surgery may be needed right away if you develop complications such as death of gallbladder tissue (gangrene) or a tear (perforation) of the gallbladder.  [place] care will depend on your treatment. In general:  If you were given antibiotics, take them as directed. Finish them even if you start to feel better.  Only take over-the-counter or prescription medicines for pain, discomfort, or fever as directed by your caregiver.  Follow a low-fat diet until you see your caregiver again.  Keep all follow-up visits as  directed by your caregiver. SEEK IMMEDIATE MEDICAL CARE IF:   Your pain is increasing and not controlled by medicines.  Your pain moves to another part of your abdomen or to your back.  You have a fever.  You have nausea and vomiting. MAKE SURE YOU:  Understand these instructions.  Will watch your condition.  Will get help right away if you are not doing well or get worse. Document Released: 10/29/2005 Document Revised: 01/21/2012 Document Reviewed: 09/14/2011 [facility] Patient Information 2014 [place], [place].  Managing Pain  Pain after surgery or related to activity is often due to strain/injury to muscle, tendon, nerves and/or incisions.  This pain is usually short-term and will improve in a few months.   Many people find it helpful to do the following things TOGETHER to help speed the process of healing and to get back to regular activity more quickly:  1. Avoid heavy physical activity a.  no lifting greater than 20 pounds b. Do not push through the pain.  Listen to your body and avoid positions and maneuvers than reproduce the pain c. Walking is okay as tolerated, but go slowly and stop when getting sore.  d. Remember: If it hurts to do it, then dont do it! 2. Take Anti-inflammatory medication  a. Take with food/snack around the clock for 1-2 weeks i. This helps the muscle and nerve tissues become less irritable and calm down faster b. Choose ONE of the following over-the-counter medications: i. Naproxen 220mg  tabs (ex. Aleve) 1-2 pills twice a day  ii. Ibuprofen 200mg  tabs (ex. Advil, Motrin) 3-4 pills with every meal and just before bedtime iii. Acetaminophen 500mg  tabs (Tylenol) 1-2 pills with every meal and just before bedtime 3. Use a Heating pad or Ice/Cold Pack a. 4-6 times a day b. May use warm bath/hottub  or showers 4. Try Gentle Massage and/or Stretching  a. at the area of pain many times a day b. stop if you feel pain - do not overdo it  Try these steps  together to help you body heal faster and avoid making things get worse.  Doing just one of these things may not be enough.    If you are not getting better after two weeks or are noticing you are getting worse, contact our office for further advice; we may need to re-evaluate you & see what other things we can do to help.   GETTING TO GOOD BOWEL HEALTH. Irregular bowel habits such as constipation and diarrhea can lead to many problems over time.  Having one soft bowel movement a day is the most important way to prevent further problems.  The anorectal canal is designed to handle stretching and feces to safely manage our ability to get rid of solid waste (  feces, poop, stool) out of our body.  BUT, hard constipated stools can act like ripping concrete bricks and diarrhea can be a burning fire to this very sensitive area of our body, causing inflamed hemorrhoids, anal fissures, increasing risk is perirectal abscesses, abdominal pain/bloating, an making irritable bowel worse.     The goal: ONE SOFT BOWEL MOVEMENT A DAY!  To have soft, regular bowel movements:    Drink at least 8 tall glasses of water a day.     Take plenty of fiber.  Fiber is the undigested part of plant food that passes into the colon, acting s natures broom to encourage bowel motility and movement.  Fiber can absorb and hold large amounts of water. This results in a larger, bulkier stool, which is soft and easier to pass. Work gradually over several weeks up to 6 servings a day of fiber (25g a day even more if needed) in the form of: o Vegetables -- Root (potatoes, carrots, turnips), leafy green (lettuce, salad greens, celery, spinach), or cooked high residue (cabbage, broccoli, etc) o Fruit -- Fresh (unpeeled skin & pulp), Dried (prunes, apricots, cherries, etc ),  or stewed ( applesauce)  o Whole grain breads, pasta, etc (whole wheat)  o Bran cereals    Bulking Agents -- This type of water-retaining fiber generally is easily  obtained each day by one of the following:  o Psyllium bran -- The psyllium plant is remarkable because its ground seeds can retain so much water. This product is available as Metamucil, Konsyl, Effersyllium, Per Diem Fiber, or the less expensive generic preparation in drug and health food stores. Although labeled a laxative, it really is not a laxative.  o Methylcellulose -- This is another fiber derived from wood which also retains water. It is available as Citrucel. o Polyethylene Glycol - and artificial fiber commonly called Miralax or Glycolax.  It is helpful for people with gassy or bloated feelings with regular fiber o Flax Seed - a less gassy fiber than psyllium   No reading or other relaxing activity while on the toilet. If bowel movements take longer than 5 minutes, you are too constipated   AVOID CONSTIPATION.  High fiber and water intake usually takes care of this.  Sometimes a laxative is needed to stimulate more frequent bowel movements, but    Laxatives are not a good long-term solution as it can wear the colon out. o Osmotics (Milk of Magnesia, Fleets phosphosoda, Magnesium citrate, MiraLax, GoLytely) are safer than  o Stimulants (Senokot, Castor Oil, Dulcolax, Ex Lax)    o Do not take laxatives for more than 7days in a row.    IF SEVERELY CONSTIPATED, try a Bowel Retraining Program: o Do not use laxatives.  o Eat a diet high in roughage, such as bran cereals and leafy vegetables.  o Drink six (6) ounces of prune or apricot juice each morning.  o Eat two (2) large servings of stewed fruit each day.  o Take one (1) heaping tablespoon of a psyllium-based bulking agent twice a day. Use sugar-free sweetener when possible to avoid excessive calories.  o Eat a normal breakfast.  o Set aside 15 minutes after breakfast to sit on the toilet, but do not strain to have a bowel movement.  o If you do not have a bowel movement by the third day, use an enema and repeat the above steps.     Controlling diarrhea o Switch to liquids and simpler foods for a few days  to avoid stressing your intestines further. o Avoid dairy products (especially milk & ice cream) for a short time.  The intestines often can lose the ability to digest lactose when stressed. o Avoid foods that cause gassiness or bloating.  Typical foods include beans and other legumes, cabbage, broccoli, and dairy foods.  Every person has some sensitivity to other foods, so listen to our body and avoid those foods that trigger problems for you. o Adding fiber (Citrucel, Metamucil, psyllium, Miralax) gradually can help thicken stools by absorbing excess fluid and retrain the intestines to act more normally.  Slowly increase the dose over a few weeks.  Too much fiber too soon can backfire and cause cramping & bloating. o Probiotics (such as active yogurt, Align, etc) may help repopulate the intestines and colon with normal bacteria and calm down a sensitive digestive tract.  Most studies show it to be of mild help, though, and such products can be costly. o Medicines:   Bismuth subsalicylate (ex. Kayopectate, Pepto Bismol) every 30 minutes for up to 6 doses can help control diarrhea.  Avoid if pregnant.   Loperamide (Immodium) can slow down diarrhea.  Start with two tablets (4mg  total) first and then try one tablet every 6 hours.  Avoid if you are having fevers or severe pain.  If you are not better or start feeling worse, stop all medicines and call your doctor for advice o Call your doctor if you are getting worse or not better.  Sometimes further testing (cultures, endoscopy, X-ray studies, bloodwork, etc) may be needed to help diagnose and treat the cause of the diarrhea.   [facility] ______[facility] LAPAROSCOPIC SURGERY: POST OP INSTRUCTIONS Always review your discharge instruction sheet given to you by the facility where your surgery was performed. IF YOU HAVE DISABILITY OR FAMILY LEAVE FORMS, YOU MUST BRING  THEM TO THE OFFICE FOR PROCESSING.   DO NOT GIVE THEM TO YOUR DOCTOR.  10. A prescription for pain medication may be given to you upon discharge.  Take your pain medication as prescribed, if needed.  If narcotic pain medicine is not needed, then you may take acetaminophen (Tylenol) or ibuprofen (Advil) as needed. 11. Take your usually prescribed medications unless otherwise directed. 12. If you need a refill on your pain medication, please contact your pharmacy.  They will contact our office to request authorization. Prescriptions will not be filled after 5pm or on week-ends. 13. You should follow a light diet the first few days after arrival home, such as soup and crackers, etc.  Be sure to include lots of fluids daily. 14. Most patients will experience some swelling and bruising in the area of the incisions.  Ice packs will help.  Swelling and bruising can take several days to resolve.  15. It is common to experience some constipation if taking pain medication after surgery.  Increasing fluid intake and taking a stool softener (such as Colace) will usually help or prevent this problem from occurring.  A mild laxative (Milk of Magnesia or Miralax) should be taken according to package instructions if there are no bowel movements after 48 hours. 16. Unless discharge instructions indicate otherwise, you may remove your bandages 24-48 hours after surgery, and you may shower at that time.  You may have steri-strips (small skin tapes) in place directly over the incision.  These strips should be left on the skin for 7-10 days.  If your surgeon used skin glue on the incision, you may shower in  24 hours.  The glue will flake off over the next 2-3 weeks.  Any sutures or staples will be removed at the office during your follow-up visit. 17. ACTIVITIES:  You may resume regular (light) daily activities beginning the next day--such as daily self-care, walking, climbing stairs--gradually increasing activities as  tolerated.  You may have sexual intercourse when it is comfortable.  Refrain from any heavy lifting or straining until approved by your doctor. a. You may drive when you are no longer taking prescription pain medication, you can comfortably wear a seatbelt, and you can safely maneuver your car and apply brakes. b. RETURN TO WORK:  __________________________________________________________ 18. You should see your doctor in the office for a follow-up appointment approximately 2-3 weeks after your surgery.  Make sure that you call for this appointment within a day or two after you arrive home to insure a convenient appointment time. 19. OTHER INSTRUCTIONS: __________________________________________________________________________________________________________________________ __________________________________________________________________________________________________________________________ WHEN TO CALL YOUR DOCTOR: 9. Fever over 101.0 10. Inability to urinate 11. Continued bleeding from incision. 12. Increased pain, redness, or drainage from the incision. 13. Increasing abdominal pain  The clinic staff is available to answer your questions during regular business hours.  Please dont hesitate to call and ask to speak to one of the nurses for clinical concerns.  If you have a medical emergency, go to the nearest emergency room or call 911.  A surgeon from [facility] is always on call at the hospital. [address], [place], [place], [place]  [address] ? [address], [place], [place]   [address] [phone] ? [phone] ? FAX [phone] Web site: [email or web address]

## 2013-12-08 NOTE — Discharge Summary (Signed)
Physician Discharge Summary  Patient ID: Bryan [name] MRN: [id number] DOB/AGE: Jun 09, 1946 68 y.o.  Admit date: 12/06/2013 Discharge date: 12/08/2013  Admitting Diagnosis: Acute cholecystitis Chronic medical conditions as below  Discharge Diagnosis Patient Active Problem List   Diagnosis Date Noted  . Acute cholecystitis with chronic cholecystitis s/p lap LOA/chole 12/07/2013 12/07/2013  . Obesity (BMI 30-39.9) 12/07/2013  . Refusal of blood transfusions as patient is Jehovah's Witness 12/07/2013  . Rheumatoid factor positive 10/26/2012  . Nonspecific abnormal finding in stool contents 07/28/2012  . Multiple joint pain 06/05/2012  . History of tobacco use-  06/05/2012  . Asthmatic bronchitis 12/15/2011  . BPH (benign prostatic hyperplasia) 12/15/2011  . Essential tremor 12/15/2011  . HTN (hypertension) 12/15/2011    Consultants None  Imaging: Ct Abdomen Pelvis Wo Contrast  12/07/2013   CLINICAL DATA:  Right flank pain, difficulty urinating.  EXAM: CT ABDOMEN AND PELVIS WITHOUT CONTRAST  TECHNIQUE: Multidetector CT imaging of the abdomen and pelvis was performed following the standard protocol without intravenous contrast.  COMPARISON:  CT of the chest, abdomen and pelvis Apr 06, 2013.  FINDINGS: Improved aeration of the lung bases, with trace dependent atelectasis on the right. Included heart and pericardium are unremarkable.  Kidneys are well located without nephrolithiasis, hydronephrosis. Limited assessment for renal masses on this nonenhanced examination. The ureters are normal in course and caliber, no urolithiasis. Urinary bladder is partially distended, harboring no intravesicular calculi. Phleboliths in the pelvis. Prostate is 4.2 cm in transaxial dimension with prostatic calcifications.  The liver, spleen, pancreas and adrenal glands are unremarkable for this nonenhanced examination. Mildly heterogeneous dependent presumed gallstones without superimposed CT findings of acute  cholecystitis.  Stomach, small and large bowel are normal in course and caliber without inflammatory changes. Extensive diverticulosis from the cecum to the sigmoid colon. Normal air-filled appendix. No intraperitoneal free fluid nor free air. Suspected small duodenal diverticulum on axial 26/78, unchanged.  Great vessels are normal in course and caliber with mild calcific atherosclerosis. Soft tissues are nonsuspicious. Moderate to severe lumbar degenerative change, with severe L5-S1 neural foraminal narrowing. Mild scoliosis.  IMPRESSION: No urolithiasis.  No acute intra-abdominal or pelvic process.  Cholelithiasis without CT findings of acute cholecystitis.  Diverticulosis without CT findings of acute diverticulitis.   Electronically Signed   By: Elon Alas   On: 12/07/2013 02:48   Dg Cholangiogram Operative  12/08/2013   CLINICAL DATA:  Cholecystectomy for cholelithiasis and cholecystitis.  EXAM: INTRAOPERATIVE CHOLANGIOGRAM  TECHNIQUE: Cholangiographic images from the C-arm fluoroscopic device were submitted for interpretation post-operatively. Please see the procedural report for the amount of contrast and the fluoroscopy time utilized.  COMPARISON:  US ABDOMEN COMPLETE dated 12/07/2013; CT ABD/PELV WO CM dated 12/07/2013  FINDINGS: Intraoperative cholangiogram obtained with a C-arm demonstrates normal opacified biliary tree without evidence of biliary dilatation, filling defects or obstruction. Contrast enters the duodenum. Minimal extravasation is identified at the injection site.  IMPRESSION: Normal intraoperative cholangiogram.   Electronically Signed   By: Aletta Edouard M.D.   On: 12/08/2013 07:49   US Abdomen Complete  12/07/2013   CLINICAL DATA:  Right upper quadrant pain  EXAM: ULTRASOUND ABDOMEN COMPLETE  COMPARISON:  12/07/2013 CT  FINDINGS: Gallbladder:  Multiple gallstones. Thickened gallbladder wall at about the 7 mm. No appreciable sonographic Murphy sign.  Common bile duct:   Diameter: 3 mm, within normal limits where seen.  Liver:  No focal lesion identified. Question mild increased echogenicity as can be seen with steatosis.  IVC:  No abnormality visualized.  Pancreas:  Obscured by overlying bowel gas artifact.  Spleen:  Measures 7 cm oblique.  No focal abnormality.  Right Kidney:  Length: 11.2 cm. Echogenicity within normal limits. No mass or hydronephrosis visualized.  Left Kidney:  Length: 11.0 cm. Echogenicity within normal limits. No mass or hydronephrosis visualized.  Abdominal aorta:  No aneurysm visualized.  Other findings:  None.  IMPRESSION: Cholelithiasis and gallbladder wall thickening. May reflect chronic or acute on chronic cholecystitis in the appropriate clinical setting. Correlate clinically and with HIDA scan if warranted.  Question mild hepatic steatosis   Electronically Signed   By: Carlos Levering M.D.   On: 12/07/2013 03:30   Dg Chest Port 1 View  12/07/2013   CLINICAL DATA:  Preop for cholecystectomy  EXAM: PORTABLE CHEST - 1 VIEW  COMPARISON:  04/06/2013  FINDINGS: Cardiomediastinal silhouette is stable. No acute infiltrate or pleural effusion. No pulmonary edema. Metallic fixation plate cervical spine again noted.  IMPRESSION: No active disease.   Electronically Signed   By: Lahoma Crocker M.D.   On: 12/07/2013 09:10    Procedures Dr. Johney Maine (12/08/12) - Laparoscopic Cholecystectomy with [facility]  Hospital Course:  68 yo black male with a h/o HTN, RA, and BPH who began having RUQ abdominal pain last night around 2000. He had a tuna fish sandwich and potato soup for supper. He has never had pain like this before, only Reflux type symptoms. He had some nausea, but no emesis. He denies diarrhea or fever. He presented to the [facility] where he had an ultrasound that revealed possible acute cholecystitis. His WBC was 10.8 and LFTs were essentially normal except alk phos was 137. Due to persistent pain, we were asked to see him for admission.  Patient was admitted and  underwent procedure listed above.  Tolerated procedure well and was transferred to the floor.  Diet was advanced as tolerated.  On POD #1, the patient was voiding well, tolerating diet, ambulating well, pain well controlled, vital signs stable, incisions c/d/i and felt stable for discharge home.  Patient will follow up in our office in 3 weeks and knows to call with questions or concerns.  Physical Exam: General:  Alert, NAD, pleasant, comfortable Abd:  Soft, ND, mild tenderness, incisions C/D/I    Medication List         acetaminophen 500 MG tablet  Commonly known as:  TYLENOL  Take 500-1,000 mg by mouth every 6 (six) hours as needed for mild pain or headache.     losartan-hydrochlorothiazide 50-12.5 MG per tablet  Commonly known as:  HYZAAR  Take 1 tablet by mouth every morning.     meloxicam 7.5 MG tablet  Commonly known as:  MOBIC  Take 1 tablet (7.5 mg total) by mouth daily. May take 2 if necessary     oxyCODONE 5 MG immediate release tablet  Commonly known as:  Oxy IR/ROXICODONE  Take 1-2 tablets (5-10 mg total) by mouth every 6 (six) hours as needed for moderate pain.     primidone 50 MG tablet  Commonly known as:  MYSOLINE  Take 100 mg by mouth at bedtime.     tamsulosin 0.4 MG Caps capsule  Commonly known as:  FLOMAX  Take 0.4 mg by mouth at bedtime.             Follow-up Information   Follow up with [facility] [place]. Schedule an appointment as soon as possible for a visit on 12/29/2013. (YOUR APPOINTMENT  TIME IS AT 3:00PM, PLEASE ARRIVE 30 MINUTES PRIOR TO YOUR APPOINTMENT TIME TO CHECK IN AND FILL OUT PAPERWORK)    Contact information:   [address] [place]   [place] [address] [phone]       Signed: Excell Seltzer [facility] [phone]  12/08/2013, 10:01 AM

## 2013-12-08 NOTE — Care Management Note (Signed)
    Page 1 of 1   12/08/2013     11:40:55 AM   CARE MANAGEMENT NOTE 12/08/2013  Patient:  Bryan [name], Bryan [name]   Account Number:  [id number]  Date Initiated:  12/08/2013  Documentation initiated by:  Sunday Spillers  Subjective/Objective Assessment:   68 yo male admitted s/p lap chole, lysis of adhesions. PTA lived at home with spouse.     Action/Plan:   Home when stable   Anticipated DC Date:  12/08/2013   Anticipated DC Plan:  [place]  CM consult      Choice offered to / List presented to:             Status of service:  Completed, signed off Medicare Important Message given?   (If response is "NO", the following Medicare IM given date fields will be blank) Date Medicare IM given:   Date Additional Medicare IM given:    Discharge Disposition:  HOME/SELF CARE  Per UR Regulation:  Reviewed for med. necessity/level of care/duration of stay  If discussed at [place] of Stay Meetings, dates discussed:    Comments:

## 2013-12-08 NOTE — Discharge Summary (Signed)
Seen, agree with above.  Pt doing well.  Ready to go home.   Reviewed reasons to call back.

## 2013-12-10 NOTE — Anesthesia Postprocedure Evaluation (Signed)
  Anesthesia Post-op Note  Patient: Bryan [name]  Procedure(s) Performed: Procedure(s) (LRB): LAPAROSCOPIC CHOLECYSTECTOMY WITH INTRAOPERATIVE CHOLANGIOGRAM (N/A)  Patient Location: PACU  Anesthesia Type: General  Level of Consciousness: awake and alert   Airway and Oxygen Therapy: Patient Spontanous Breathing  Post-op Pain: mild  Post-op Assessment: Post-op Vital signs reviewed, Patient's Cardiovascular Status Stable, Respiratory Function Stable, Patent Airway and No signs of Nausea or vomiting  Last Vitals:  Filed Vitals:   12/08/13 1000  BP: 120/70  Pulse: 98  Temp: 36.7 C  Resp: 18    Post-op Vital Signs: stable   Complications: No apparent anesthesia complications

## 2013-12-11 ENCOUNTER — Telehealth (INDEPENDENT_AMBULATORY_CARE_PROVIDER_SITE_OTHER): Payer: Self-pay

## 2013-12-11 NOTE — Telephone Encounter (Signed)
Called pt to notify him that the path report does not show temporal arteritis per Dr Johney Maine. The pt was advised to f/u with his PCP.

## 2013-12-23 ENCOUNTER — Telehealth (INDEPENDENT_AMBULATORY_CARE_PROVIDER_SITE_OTHER): Payer: Self-pay | Admitting: *Deleted

## 2013-12-23 NOTE — Telephone Encounter (Signed)
Patient called to report that he has not had any issues since his surgery however today he has been having abdominal pain and "grumbling" since breakfast.  Patient states following eating breakfast is when the symptoms started.  I explained to the patient to try eating a bland diet for the next 24 hours to see if his stomach will settle back down.  Explained to patient to give [place] a call back if he continues to have issues but it sounds like he just ate something that upset his stomach.  Patient states understanding and agreeable at this time.

## 2013-12-29 ENCOUNTER — Ambulatory Visit (INDEPENDENT_AMBULATORY_CARE_PROVIDER_SITE_OTHER): Payer: Medicare Other | Admitting: General Surgery

## 2013-12-29 ENCOUNTER — Encounter (INDEPENDENT_AMBULATORY_CARE_PROVIDER_SITE_OTHER): Payer: Self-pay | Admitting: General Surgery

## 2013-12-29 VITALS — BP 118/72 | HR 80 | Temp 98.8°F | Resp 14 | Ht 65.0 in | Wt 221.0 lb

## 2013-12-29 DIAGNOSIS — K810 Acute cholecystitis: Secondary | ICD-10-CM

## 2013-12-29 NOTE — Progress Notes (Signed)
KIA STAVROS 08/05/1946 [id number] 12/29/2013   DEVION CHRISCOE is a 68 y.o. male who had a laparoscopic cholecystectomy with intraoperative cholangiogram by Dr. Michael Boston.  The pathology report confirmed acute cholecystitis with cholelithiasis.  The patient reports that they are feeling well with normal bowel movements and good appetite.  The pre-operative symptoms of abdominal pain, nausea, and vomiting have resolved.    Physical examination - Incisions appear well-healed with no sign of infection or bleeding.   Abdomen - soft, non-tender  Impression:  s/p laparoscopic cholecystectomy  Plan:  He may resume a regular diet and full activity.  He may follow-up on a PRN basis.

## 2013-12-29 NOTE — Patient Instructions (Signed)
Follow up as needed

## 2014-01-21 ENCOUNTER — Ambulatory Visit: Payer: Self-pay | Admitting: Family Medicine

## 2014-01-21 VITALS — BP 136/84 | HR 68 | Temp 98.3°F | Resp 18 | Ht 65.0 in | Wt 220.0 lb

## 2014-01-21 DIAGNOSIS — Z0289 Encounter for other administrative examinations: Secondary | ICD-10-CM

## 2014-01-21 DIAGNOSIS — R059 Cough, unspecified: Secondary | ICD-10-CM

## 2014-01-21 DIAGNOSIS — R05 Cough: Secondary | ICD-10-CM

## 2014-01-21 NOTE — Patient Instructions (Signed)
Take DELSYM 10 ML (2 TEASPOONS) at bedtime for cough.

## 2014-01-21 NOTE — Progress Notes (Signed)
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 Unfortunately the letters from the Department of Transportation never explained what they're concerned about a patient's health  and don't give any care clear guidelines. There is not a clear issue to be addressed.  The patient been doing well no major new problems. He continues to get his care at the [facility]. He was there this morning. He has a 15 year history of a right hand essential tremor. This is controlled to some degree with Mysoline 100 mg daily. He wonders whether that is the problem that they're concerned about. The tremor only bothers him when he is trying to right. No gross motor activities are affected by. He is continued to drive throughout the years with it with no major infarctions.  Past history: Surgeries: Recently had cholecystectomy. Last year he had neck surgery. Medical illnesses: Hypertension, essential tremor, BPH, osteoarthritis Medications and allergies: See chart  Review of systems: Constitutional: Unremarkable Respiratory: Unremarkable HEENT: Wears hearing aids. Wears glasses. Cardiovascular: Unremarkable Gastrointestinal: Unremarkable Genitourinary: Unremarkable Musculoskeletal: Unremarkable Dermatologic: Unremarkable Neurologic: Essential tremor as noted above Psychiatric: Unremarkable   Physical exam: Healthy-appearing male in no major distress. TMs normal. Wears bilateral hearing aids. For is clear. Neck supple without nodes. Chest clear. Heart regular without murmurs. Abdomen soft the mass tenderness. Extremities unremarkable with full range of motion. Minimal right hand tremor however when he tries to right it becomes much more apparent. Skin unremarkable.  Assessment: Essential tremor Hypertension Benign prostatic hypertrophy Osteoarthritis   Plan: Form completed

## 2014-03-21 ENCOUNTER — Other Ambulatory Visit: Payer: Self-pay | Admitting: Family Medicine

## 2014-03-26 ENCOUNTER — Other Ambulatory Visit: Payer: Self-pay | Admitting: Family Medicine

## 2014-04-11 ENCOUNTER — Encounter (HOSPITAL_COMMUNITY): Payer: Self-pay | Admitting: Emergency Medicine

## 2014-04-11 ENCOUNTER — Emergency Department (HOSPITAL_COMMUNITY)
Admission: EM | Admit: 2014-04-11 | Discharge: 2014-04-11 | Disposition: A | Discharge status: Home / Self Care | Payer: Medicare Other | Attending: Emergency Medicine | Admitting: Emergency Medicine

## 2014-04-11 DIAGNOSIS — Z8719 Personal history of other diseases of the digestive system: Secondary | ICD-10-CM | POA: Insufficient documentation

## 2014-04-11 DIAGNOSIS — I10 Essential (primary) hypertension: Secondary | ICD-10-CM

## 2014-04-11 DIAGNOSIS — Z79899 Other long term (current) drug therapy: Secondary | ICD-10-CM | POA: Insufficient documentation

## 2014-04-11 DIAGNOSIS — M129 Arthropathy, unspecified: Secondary | ICD-10-CM | POA: Insufficient documentation

## 2014-04-11 DIAGNOSIS — F411 Generalized anxiety disorder: Secondary | ICD-10-CM | POA: Insufficient documentation

## 2014-04-11 DIAGNOSIS — G8929 Other chronic pain: Secondary | ICD-10-CM | POA: Insufficient documentation

## 2014-04-11 DIAGNOSIS — G4730 Sleep apnea, unspecified: Secondary | ICD-10-CM | POA: Insufficient documentation

## 2014-04-11 DIAGNOSIS — M542 Cervicalgia: Secondary | ICD-10-CM | POA: Insufficient documentation

## 2014-04-11 DIAGNOSIS — M7989 Other specified soft tissue disorders: Secondary | ICD-10-CM | POA: Insufficient documentation

## 2014-04-11 DIAGNOSIS — G40909 Epilepsy, unspecified, not intractable, without status epilepticus: Secondary | ICD-10-CM | POA: Insufficient documentation

## 2014-04-11 DIAGNOSIS — M25569 Pain in unspecified knee: Secondary | ICD-10-CM | POA: Insufficient documentation

## 2014-04-11 DIAGNOSIS — E669 Obesity, unspecified: Secondary | ICD-10-CM | POA: Insufficient documentation

## 2014-04-11 DIAGNOSIS — N400 Enlarged prostate without lower urinary tract symptoms: Secondary | ICD-10-CM | POA: Insufficient documentation

## 2014-04-11 DIAGNOSIS — Z872 Personal history of diseases of the skin and subcutaneous tissue: Secondary | ICD-10-CM | POA: Insufficient documentation

## 2014-04-11 DIAGNOSIS — Z791 Long term (current) use of non-steroidal anti-inflammatories (NSAID): Secondary | ICD-10-CM | POA: Insufficient documentation

## 2014-04-11 DIAGNOSIS — Z87891 Personal history of nicotine dependence: Secondary | ICD-10-CM | POA: Insufficient documentation

## 2014-04-11 MED ORDER — METHYLPREDNISOLONE 4 MG PO KIT: PACK | ORAL | Status: DC

## 2014-04-11 MED ORDER — KETOROLAC TROMETHAMINE 15 MG/ML IJ SOLN
15.0000 mg | Freq: Once | INTRAMUSCULAR | Status: AC
  Administered 2014-04-11: 15 mg via INTRAMUSCULAR
  Filled 2014-04-11 (×2): qty 1

## 2014-04-11 MED ORDER — DIAZEPAM 5 MG PO TABS: 5.0000 mg | ORAL_TABLET | Freq: Two times a day (BID) | ORAL | Status: DC

## 2014-04-11 MED ORDER — DIAZEPAM 2 MG PO TABS
2.0000 mg | ORAL_TABLET | Freq: Once | ORAL | Status: AC
  Administered 2014-04-11: 2 mg via ORAL
  Filled 2014-04-11: qty 1

## 2014-04-11 NOTE — ED Notes (Signed)
Pt reports R arm and bilateral knee pain x3 months. Pt has been seen at [facility] hospital, had MRI and was dx with arthritis and muscle spasms. Pt here today to see if he can get relief from pain. Pt states that he is not sleeping. Pt was given pain meds and has had no relief. Pt is A&O and in NAD, wife at bedside

## 2014-04-11 NOTE — ED Notes (Signed)
Pt reports bilateral arm pain x3 months. Hand, knee, feet swelling. Bilateral knee pain which he says is arthritis. Pt had neck surgery last year for "collapsed vertebrae" and was fine until 3 months ago. Had MRI this month that showed arthritis and was told he was having muscle spasms. Is Rx'd oxycodone, mobic, gabapentin and tylenol. Sts he stopped taking them over a week ago because he was swelling.

## 2014-04-11 NOTE — ED Provider Notes (Signed)
CSN: [id number]     Arrival date & time 04/11/14  1053 History   First MD Initiated Contact with Patient 04/11/14 1235     Chief Complaint  Patient presents with  . Neck Pain  . Knee Pain   HPI  Bryan [name] is a 68 y.o. male with a PMH of arthritis, GERD, sleep apnea, ulcer, seizures, BPH, HTN who presents to the ED for evaluation of neck and knee pain. History was provided by the patient. Patient has had chronic unchanged neck and bilateral knee pain for 3 months. Patient has been to the [place] for this and hx with arthritis and muscles spasms. Has tried oxycodone, mobic, gabapentin and Tylenol with no relief. He complains of neck pain which radiates down into his hands bilaterally (L > R). He states he had a MRI done at the [place] and now has an appointment with a surgeon on 06/28/14 but he "cannot take the pain anymore." He also complains of bilateral knee pain (R > L). He also has bilateral leg and hand swelling which is chronic and unchanged. He states he stopped taking his pain medications because they are not working. He denies any fevers, numbness, tingling, loss of sensation, weakness, chest pain, SOB, headache, abdominal pain, nausea, emesis, dysuria, back pain, or loss of bowel/bladder function. Denies any new injuries or trauma. Hx of anterior cervical decompression/disectomy fusion in 03/2013.    Past Medical History  Diagnosis Date  . Obesity   . HTN (hypertension)   . BPH (benign prostatic hypertrophy)   . Seizures     AS A CHILD.  [name] Ulcer   . Sleep apnea     sleep study, produced known sleep apnea, pt. has not gotten device yet, wants to get his neck fixed 1st   . Hearing deficit     wears hearing aids bilateral  . GERD (gastroesophageal reflux disease)     reports for indigestion he uses mustard   . Arthritis     hands, knees, cervical area  . Essential tremor   . Anxiety    Past Surgical History  Procedure Laterality Date  . Tonsillectomy    . Eye surgery      right,  growth excision  . Colonoscopy    . Vasectomy    . Anterior cervical decomp/discectomy fusion N/A 03/30/2013    Procedure: ANTERIOR CERVICAL DECOMPRESSION/DISCECTOMY FUSION 2 LEVELS;  Surgeon: Otilio Connors, MD;  Location: [facility];  Service: Neurosurgery;  Laterality: N/A;  C4-5 C5-6 Anterior cervical decompression/diskectomy/fusion/Allograft/Plate  . Spine surgery    . Wrist ganglion excision Left   . Cholecystectomy N/A 12/07/2013    Procedure: LAPAROSCOPIC CHOLECYSTECTOMY WITH INTRAOPERATIVE CHOLANGIOGRAM;  Surgeon: Adin Hector, MD;  Location: [facility];  Service: General;  Laterality: N/A;   Family History  Problem Relation Age of Onset  . Colon cancer Maternal Grandfather   . Diabetes Paternal Aunt     x 4 aunts  . Kidney disease Brother   . Stroke Maternal Grandmother    History  Substance Use Topics  . Smoking status: Former Smoker    Types: Cigarettes    Quit date: 12/14/1972  . Smokeless tobacco: Never Used  . Alcohol Use: Yes     Comment: occasionally    Review of Systems  Constitutional: Negative for fever, chills, activity change, appetite change and fatigue.  Respiratory: Negative for cough and shortness of breath.   Cardiovascular: Positive for leg swelling. Negative for chest pain.  Gastrointestinal: Negative  for nausea, vomiting and abdominal pain.  Genitourinary: Negative for dysuria and difficulty urinating.  Musculoskeletal: Positive for arthralgias, gait problem, joint swelling, myalgias and neck pain. Negative for back pain and neck stiffness.  Skin: Negative for color change and wound.  Neurological: Negative for weakness, numbness and headaches.    Allergies  Ampicillin and Hydrocodone  Home Medications   Prior to Admission medications   Medication Sig Start Date End Date Taking? Authorizing Provider  gabapentin (NEURONTIN) 100 MG capsule Take 300 mg by mouth 3 (three) times daily.   Yes Historical Provider, MD  losartan-hydrochlorothiazide  (HYZAAR) 50-12.5 MG per tablet Take 1 tablet by mouth every morning.   Yes Historical Provider, MD  meloxicam (MOBIC) 15 MG tablet Take 15 mg by mouth daily.   Yes Historical Provider, MD  oxyCODONE (OXY IR/ROXICODONE) 5 MG immediate release tablet Take 5 mg by mouth at bedtime as needed for moderate pain or severe pain.   Yes Historical Provider, MD  primidone (MYSOLINE) 50 MG tablet Take 100 mg by mouth daily.  04/05/13  Yes Darlyne Russian, MD  Tamsulosin HCl (FLOMAX) 0.4 MG CAPS Take 0.4 mg by mouth at bedtime.    Yes Historical Provider, MD   BP 156/66  Pulse 86  Temp(Src) 98.3 F (36.8 C) (Oral)  Resp 20  SpO2 100%  Filed Vitals:   04/11/14 1130 04/11/14 1603  BP: 156/66 162/94  Pulse: 86 73  Temp: 98.3 F (36.8 C)   TempSrc: Oral   Resp: 20 16  SpO2: 100% 99%    Physical Exam  Nursing note and vitals reviewed. Constitutional: He is oriented to person, place, and time. He appears well-developed and well-nourished. No distress.  HENT:  Head: Normocephalic and atraumatic.    Right Ear: External ear normal.  Left Ear: External ear normal.  Nose: Nose normal.  Mouth/Throat: Oropharynx is clear and moist.  Eyes: Conjunctivae are normal. Right eye exhibits no discharge. Left eye exhibits no discharge.  Neck: Normal range of motion. Neck supple.  Tenderness to palpation to the cervical paraspinal muscles diffusely. No cervical lymphadenopathy. No nuchal rigidity. Pain worse with ROM of the shoulders bilaterally.   Cardiovascular: Normal rate, regular rhythm and normal heart sounds.  Exam reveals no gallop and no friction rub.   No murmur heard. Radial and dorsalis pedis pulses present and equal bilaterally  Pulmonary/Chest: Effort normal and breath sounds normal. No respiratory distress. He has no wheezes. He has no rales. He exhibits no tenderness.  Abdominal: Soft. He exhibits no distension. There is no tenderness.  Musculoskeletal: Normal range of motion. He exhibits edema  and tenderness.       Legs: No tenderness to palpation to the UE throughout. Minimal non-pitting to the digits and dorsum of the hands equal bilaterally. No thoracic or lumbar spinal tenderness. Tenderness to palpation to the anterior knees bilaterally. No joint edema, effusion, erythema, warmth, or wounds. Mild trace pitting edema to the LE equal bilaterally.   Neurological: He is alert and oriented to person, place, and time.  Sensation intact in the UE and LE bilaterally.   Skin: Skin is warm and dry. He is not diaphoretic.     ED Course  Procedures (including critical care time) Labs Review Labs Reviewed - No data to display  Imaging Review No results found.   EKG Interpretation None      MDM   Bryan [name] is a 68 y.o. male with a PMH of arthritis, GERD, sleep apnea, ulcer,  seizures, BPH, HTN who presents to the ED for evaluation of chronic unchanged neck and knee pain. Patient neurovascularly intact with no focal neurological deficits. No evidence of a septic joint. Patient afebrile and non-toxic in appearance. No meningeal signs. Patient had improvements in his pain throughout his ED visit. Has follow-up scheduled with his specialists. Will try medrol dose pack and valium. Patient encouraged to continue percocet, mobic and other prescribed medications. Patient's BP elevated in the 767-341'P systolic. Patient encouraged to continue home anti-HTN medications and follow-up with PCP. Return precautions, discharge instructions, and follow-up was discussed with the patient before discharge.    Rechecks  3:15 PM = Patient states his pain has improved. Ready for discharge.      Discharge Medication List as of 04/11/2014  3:32 PM    START taking these medications   Details  diazepam (VALIUM) 5 MG tablet Take 1 tablet (5 mg total) by mouth 2 (two) times daily., Starting 04/11/2014, Until Discontinued, Print    methylPREDNISolone (MEDROL DOSEPAK) 4 MG tablet Day 1: 8 mg (2 tablets)  breakfast, 4 mg (1 tablet) lunch, 4 mg (1 tablet) supper, and 8 mg (2 tablets) bedtime  Day 2: 4 mg (1 tablet) breakfast, 4 mg (1 tablet) lunch, 4 mg (1 tablet) supper, and 8 mg (2 tablets) bedtime Day 3: 4 mg (1 tablet) 4 times  daily  Day 4: 4 mg (1 tablet) three time daily  Day 5: 4 mg (1 tablet) twice daily Day 6: 4 mg (1 tablet) once, Print        Final impressions: 1. Chronic neck pain   2. Chronic knee pain   3. Hypertension       Mercy Moore PA-C   This patient was discussed with Dr. Nonda Lou, PA-C 04/13/14 [phone]

## 2014-04-11 NOTE — Discharge Instructions (Signed)
Take valium for muscle spasm - Please be careful with this medication.  It can cause drowsiness.  Use caution while driving, operating machinery, drinking alcohol, or any other activities that may impair your physical or mental abilities.   Take medrol dose Pak - this will help with swelling and inflammation Continue Percocet, mobic, and your other prescribed medications as directed Return to the emergency department if you develop any changing/worsening condition, chest pain, fever, difficulty breathing, leg swelling on one side, or any other concerns (please read additional information regarding your condition below)    Cervical Radiculopathy Cervical radiculopathy happens when a nerve in the neck is pinched or bruised by a slipped (herniated) disk or by arthritic changes in the bones of the cervical spine. This can occur due to an injury or as part of the normal aging process. Pressure on the cervical nerves can cause pain or numbness that runs from your neck all the way down into your arm and fingers. CAUSES  There are many possible causes, including:  Injury.  Muscle tightness in the neck from overuse.  Swollen, painful joints (arthritis).  Breakdown or degeneration in the bones and joints of the spine (spondylosis) due to aging.  Bone spurs that may develop near the cervical nerves. SYMPTOMS  Symptoms include pain, weakness, or numbness in the affected arm and hand. Pain can be severe or irritating. Symptoms may be worse when extending or turning the neck. DIAGNOSIS  Your caregiver will ask about your symptoms and do a physical exam. He or she may test your strength and reflexes. X-rays, CT scans, and MRI scans may be needed in cases of injury or if the symptoms do not go away after a period of time. Electromyography (EMG) or nerve conduction testing may be done to study how your nerves and muscles are working. TREATMENT  Your caregiver may recommend certain exercises to help relieve  your symptoms. Cervical radiculopathy can, and often does, get better with time and treatment. If your problems continue, treatment options may include:  Wearing a soft collar for short periods of time.  Physical therapy to strengthen the neck muscles.  Medicines, such as nonsteroidal anti-inflammatory drugs (NSAIDs), oral corticosteroids, or spinal injections.  Surgery. Different types of surgery may be done depending on the cause of your problems. HOME CARE INSTRUCTIONS   Put ice on the affected area.  Put ice in a plastic bag.  Place a towel between your skin and the bag.  Leave the ice on for 15-20 minutes, 03-04 times a day or as directed by your caregiver.  If ice does not help, you can try using heat. Take a warm shower or bath, or use a hot water bottle as directed by your caregiver.  You may try a gentle neck and shoulder massage.  Use a flat pillow when you sleep.  Only take over-the-counter or prescription medicines for pain, discomfort, or fever as directed by your caregiver.  If physical therapy was prescribed, follow your caregiver's directions.  If a soft collar was prescribed, use it as directed. SEEK IMMEDIATE MEDICAL CARE IF:   Your pain gets much worse and cannot be controlled with medicines.  You have weakness or numbness in your hand, arm, face, or leg.  You have a high fever or a stiff, rigid neck.  You lose bowel or bladder control (incontinence).  You have trouble with walking, balance, or speaking. MAKE SURE YOU:   Understand these instructions.  Will watch your condition.  Will get  help right away if you are not doing well or get worse. Document Released: 07/24/2001 Document Revised: 01/21/2012 Document Reviewed: 06/12/2011 [facility] Patient Information 2014 [place], [place].  Knee Pain Knee pain can be a result of an injury or other medical conditions. Treatment will depend on the cause of your pain. HOME CARE  Only take medicine as told  by your doctor.  Keep a healthy weight. Being overweight can make the knee hurt more.  Stretch before exercising or playing sports.  If there is constant knee pain, change the way you exercise. Ask your doctor for advice.  Make sure shoes fit well. Choose the right shoe for the sport or activity.  Protect your knees. Wear kneepads if needed.  Rest when you are tired. GET HELP RIGHT AWAY IF:   Your knee pain does not stop.  Your knee pain does not get better.  Your knee joint feels hot to the touch.  You have a fever. MAKE SURE YOU:   Understand these instructions.  Will watch this condition.  Will get help right away if you are not doing well or get worse. Document Released: 01/25/2009 Document Revised: 01/21/2012 Document Reviewed: 01/25/2009 [facility] Patient Information 2014 [place], [place].  Arthritis, Nonspecific Arthritis is inflammation of a joint. This usually means pain, redness, warmth or swelling are present. One or more joints may be involved. There are a number of types of arthritis. Your caregiver may not be able to tell what type of arthritis you have right away. CAUSES  The most common cause of arthritis is the wear and tear on the joint (osteoarthritis). This causes damage to the cartilage, which can break down over time. The knees, hips, back and neck are most often affected by this type of arthritis. Other types of arthritis and common causes of joint pain include:  Sprains and other injuries near the joint. Sometimes minor sprains and injuries cause pain and swelling that develop hours later.  Rheumatoid arthritis. This affects hands, feet and knees. It usually affects both sides of your body at the same time. It is often associated with chronic ailments, fever, weight loss and general weakness.  Crystal arthritis. Gout and pseudo gout can cause occasional acute severe pain, redness and swelling in the foot, ankle, or knee.  Infectious arthritis.  Bacteria can get into a joint through a break in overlying skin. This can cause infection of the joint. Bacteria and viruses can also spread through the blood and affect your joints.  Drug, infectious and allergy reactions. Sometimes joints can become mildly painful and slightly swollen with these types of illnesses. SYMPTOMS   Pain is the main symptom.  Your joint or joints can also be red, swollen and warm or hot to the touch.  You may have a fever with certain types of arthritis, or even feel overall ill.  The joint with arthritis will hurt with movement. Stiffness is present with some types of arthritis. DIAGNOSIS  Your caregiver will suspect arthritis based on your description of your symptoms and on your exam. Testing may be needed to find the type of arthritis:  Blood and sometimes urine tests.  X-ray tests and sometimes CT or MRI scans.  Removal of fluid from the joint (arthrocentesis) is done to check for bacteria, crystals or other causes. Your caregiver (or a specialist) will numb the area over the joint with a local anesthetic, and use a needle to remove joint fluid for examination. This procedure is only minimally uncomfortable.  Even with  these tests, your caregiver may not be able to tell what kind of arthritis you have. Consultation with a specialist (rheumatologist) may be helpful. TREATMENT  Your caregiver will discuss with you treatment specific to your type of arthritis. If the specific type cannot be determined, then the following general recommendations may apply. Treatment of severe joint pain includes:  Rest.  Elevation.  Anti-inflammatory medication (for example, ibuprofen) may be prescribed. Avoiding activities that cause increased pain.  Only take over-the-counter or prescription medicines for pain and discomfort as recommended by your caregiver.  Cold packs over an inflamed joint may be used for 10 to 15 minutes every hour. Hot packs sometimes feel  better, but do not use overnight. Do not use hot packs if you are diabetic without your caregiver's permission.  A cortisone shot into arthritic joints may help reduce pain and swelling.  Any acute arthritis that gets worse over the next 1 to 2 days needs to be looked at to be sure there is no joint infection. Long-term arthritis treatment involves modifying activities and lifestyle to reduce joint stress jarring. This can include weight loss. Also, exercise is needed to nourish the joint cartilage and remove waste. This helps keep the muscles around the joint strong. HOME CARE INSTRUCTIONS   Do not take aspirin to relieve pain if gout is suspected. This elevates uric acid levels.  Only take over-the-counter or prescription medicines for pain, discomfort or fever as directed by your caregiver.  Rest the joint as much as possible.  If your joint is swollen, keep it elevated.  Use crutches if the painful joint is in your leg.  Drinking plenty of fluids may help for certain types of arthritis.  Follow your caregiver's dietary instructions.  Try low-impact exercise such as:  Swimming.  Water aerobics.  Biking.  Walking.  Morning stiffness is often relieved by a warm shower.  Put your joints through regular range-of-motion. SEEK MEDICAL CARE IF:   You do not feel better in 24 hours or are getting worse.  You have side effects to medications, or are not getting better with treatment. SEEK IMMEDIATE MEDICAL CARE IF:   You have a fever.  You develop severe joint pain, swelling or redness.  Many joints are involved and become painful and swollen.  There is severe back pain and/or leg weakness.  You have loss of bowel or bladder control. Document Released: 12/06/2004 Document Revised: 01/21/2012 Document Reviewed: 12/22/2008 [facility] Patient Information 2014 [place].   Emergency Department Resource Guide 1) Find a Doctor and Pay Out of Pocket Although you won't  have to find out who is covered by your insurance plan, it is a good idea to ask around and get recommendations. You will then need to call the office and see if the doctor you have chosen will accept you as a new patient and what types of options they offer for patients who are self-pay. Some doctors offer discounts or will set up payment plans for their patients who do not have insurance, but you will need to ask so you aren't surprised when you get to your appointment.  2) Contact Your Local Health Department Not all health departments have doctors that can see patients for sick visits, but many do, so it is worth a call to see if yours does. If you don't know where your local health department is, you can check in your phone book. The CDC also has a tool to help you locate your state's health department,  and many state websites also have listings of all of their local health departments.  3) Find a [facility] If your illness is not likely to be very severe or complicated, you may want to try a walk in clinic. These are popping up all over the country in pharmacies, drugstores, and shopping centers. They're usually staffed by nurse practitioners or physician assistants that have been trained to treat common illnesses and complaints. They're usually fairly quick and inexpensive. However, if you have serious medical issues or chronic medical problems, these are probably not your best option.  No Primary Care Doctor: - Call Health Connect at  [phone] - they can help you locate a primary care doctor that  accepts your insurance, provides certain services, etc. - Physician Referral Service- [phone]  Chronic Pain Problems: Organization         Address  Phone   Notes  [facility]  [phone] Patients need to be referred by their primary care doctor.   Medication Assistance: Organization         Address  Phone   Notes  [facility] Medication [facility]  [address] [place], [place] [address] [phone] --Must be a resident of [facility] -- Must have NO insurance coverage whatsoever (no Medicaid/ Medicare, etc.) -- The pt. MUST have a primary care doctor that directs their care regularly and follows them in the community   MedAssist  [phone]   [employer]  [phone]    Agencies that provide inexpensive medical care: Organization         Address  Phone   Notes  [place]  [phone]   [facility]    [phone]   [facility] [place], [place] [address] [phone]   [place] [address] [place] [phone]   Planned Parenthood    [phone]   [facility]    [phone]   [place] and [place] [address], [place] Phone:  [phone], Fax:  [phone] Hours of Operation:  9 am - 6 pm, M-F.  Also accepts Medicaid/Medicare and self-pay.  [facility] for [place] [address], [address], [place] Phone: [phone], Fax: [phone]. Hours of Operation:  8:30 am - 5:30 pm, M-F.  Also accepts Medicaid and self-pay.  [facility] [address], [place] Phone: [phone]   [place], [place], [place] [phone], Ext. [phone] Mondays & Thursdays: 7-9 AM.  First 15 patients are seen on a first come, first serve basis.    [place] Providers:  Organization         Address  Phone   Notes  [facility] [address],  [phone] Also accepts self-pay patients.  [facility] [id number] [place], [place]  [phone]   [place], [address], [place] [phone]   [facility] [address] [place] [phone]     Lucianne Lei [address], [place]   [phone] Only accepts [place] Access [place] patients after they have their name applied to their card.   Self-Pay (no insurance) in [facility]:  Organization  Address  Phone   Notes  Sickle Cell Patients, [facility] [place] [phone]   [facility] [place] [phone]   [facility] [place]  [place], [address], [place] [phone]   [facility]/Dr. Osei-Bonsu  [address], [place] or [address], [place] [phone] Phone number for both [place] and [place] locations is the same.  Urgent Medical and [facility] [address] [place] [phone]   [facility] [address] [place] or [address] [phone]   [facility] [address] [place] [phone], phone; [phone], fax Sees patients 1st and 3rd Saturday of every month.  Must not qualify for public or private insurance (i.e. Medicaid, Medicare, [place] Health Choice, Veterans' Benefits)  Household income should be no more than 200% of the poverty level The clinic cannot treat you if you are pregnant or think you are pregnant  Sexually transmitted diseases are not treated at the clinic.    Dental Care: Organization         Address  Phone  Notes  [facility] Department of [facility] [phone] Accepts children up to age 60 who are enrolled in [place] or [place]; pregnant women with a Medicaid card; and children who have applied for Medicaid or [place] Health Choice, but were declined, whose parents can pay a reduced fee at time of service.  [facility] Department of [facility]  [address], [place] [phone] Accepts children  up to age 88 who are enrolled in [place] or [place]; pregnant women with a Medicaid card; and children who have applied for Medicaid or [place] Health Choice, but were declined, whose parents can pay a reduced fee at time of service.  [place] Adult Dental Access PROGRAM  [place] [phone] Patients are seen by appointment only. Walk-ins are not accepted. [place] will see patients 71 years of age and older. Monday - Tuesday (8am-5pm) Most Wednesdays (8:30-5pm) $30 per visit, cash only   [facility] Adult Dental Access PROGRAM  [address], [facility] [phone] Patients are seen by appointment only. Walk-ins are not accepted. [place] will see patients 44 years of age and older. One Wednesday Evening (Monthly: Volunteer Based).  $30 per visit, cash only  [place]  [phone] for adults; Children under age 77, call Graduate Pediatric Dentistry at [phone]. Children aged 35-14, please call [phone] to request a pediatric application.  Dental services are provided in all areas of dental care including fillings, crowns and bridges, complete and partial dentures, implants, gum treatment, root canals, and extractions. Preventive care is also provided. Treatment is provided to both adults and children. Patients are selected via a lottery and there is often a waiting list.   [facility] [address], [place]  [phone] [email or web address]   [facility] [address] [place], [place] [phone], Ext. [phone] Second and Fourth Thursday of each month, opens at 6:30 AM; Clinic ends at 9 AM.  Patients are seen on a first-come first-served basis, and a limited number are seen during each clinic.   [facility]  [address] [place],  [place] [phone]   Eligibility Requirements You must have lived in [place], [place], or [place] counties for at least the last  three months.   You cannot be eligible for state or federal sponsored [employer], including [employer], [place], or [employer].   You generally cannot be eligible for healthcare insurance through your employer.    How to apply: Eligibility screenings are held every Tuesday and Wednesday afternoon from 1:00 pm until 4:00 pm. You do not need an appointment for the interview!  [facility] [address] [place], [place]   [place]  [place] Department  [place]  [phone]    Behavioral Health Resources in the Community: Intensive Outpatient Programs Organization         Address  Phone  Notes  [place]. [address] [place], [place] [phone]   [facility] Outpatient [address], [place], [place]   ADS: Alcohol & Drug Svcs [address], [place], [place]   [place] [address]  [place], [place] or [phone]   Substance Abuse Resources Organization         Address  Phone  Notes  Alcohol and Drug Services  [phone]   [place]  [phone]   The Register   Chinita Pester  [phone]   Residential & Outpatient Substance Abuse Program  [phone]   Psychological Services Organization         Address  Phone  Notes  [facility]  [place]  [phone]   [place] [address], [place] or [phone]    Mobile Crisis Teams Organization         Address  Phone  Notes  [facility], Mobile Crisis Care Unit  [phone]   [facility]  [address] [place], [place]   [place] [address] [place] [phone]     Self-Help/Support Groups Organization         Address  Phone             Notes  [place]. of [place] - variety of support groups  [place] Call for more information  Narcotics Anonymous (NA), [facility] [address], [employer] [place]  2 meetings at this location   Special educational needs teacher         Address  Phone  Notes  ASAP Residential Treatment [place],    [place]  [phone]   [facility]  [address], [place] 503546, [place], [place]   [place], [place] [phone] Admissions: 8am-3pm M-F  Incentives Substance [place] [address],    [place], [place] [phone]   [facility] [address] Jadene Pierini [place], [place]   [facility] [address],  [place], [place]   [facility] Programs - Intensive Outpatient [address] Kristeen [address], [place], [place]   [facility] ([place].) [place].,  [place], [place] or [phone]   [facility]idential Treatment Services (RTS) [address] [place], [place] Accepts Medicaid  [facility] [place] [address]  [place] [phone] Substance Abuse/Addiction Treatment   [facility] Resources Organization  Address  Phone  Notes  [facility]  [phone]   Domenic Schwab, PhD [address] [place], [place]   [phone] or [phone]   [place], [place] [phone]   [address], [place], [place] [phone] Insurance/Medicaid/sponsorship through [facility] and Families [address], [address] [place]                                    [place], [place] [phone] [place] [address], [place] [phone]    Dr. Adele Schilder  [phone]   Free  Clinic of [place] Dept. 1) [address] [place] 2) [place] 3)  [address], [place] [phone]  [phone]   [place] [phone] or [phone] (After Hours)

## 2014-04-14 NOTE — ED Provider Notes (Signed)
Medical screening examination/treatment/procedure(s) were conducted as a shared visit with non-physician practitioner(s) and myself.  I personally evaluated the patient during the encounter.   EKG Interpretation None      Pt with chronic cervical radiculopathy down both arms with recent MRI through the [place] and currently waiting for specialist f/u.  He denies any change in his pain and no weakness of the upper ext or incontinence.  Pt just tired of being in pain and meds he has does not seem to help.  Pt given toradol and steroids and valium here.  He notes improvement after toradol and valium.  ppx valium and medrol dose pack.  tp has plans to speak with the [name] on Monday for f/u on his upcoming appointments  Blanchie Dessert, MD 04/14/14 1326

## 2014-04-18 ENCOUNTER — Other Ambulatory Visit: Payer: Self-pay | Admitting: Physician Assistant

## 2014-04-25 ENCOUNTER — Other Ambulatory Visit: Payer: Self-pay | Admitting: Physician Assistant

## 2014-05-05 ENCOUNTER — Other Ambulatory Visit: Payer: Self-pay | Admitting: Family Medicine

## 2014-05-08 ENCOUNTER — Other Ambulatory Visit: Payer: Self-pay | Admitting: Family Medicine

## 2014-05-09 ENCOUNTER — Ambulatory Visit (INDEPENDENT_AMBULATORY_CARE_PROVIDER_SITE_OTHER): Payer: Medicare Other | Admitting: Emergency Medicine

## 2014-05-09 VITALS — BP 144/70 | HR 94 | Temp 98.1°F | Resp 18 | Ht 65.0 in | Wt 227.0 lb

## 2014-05-09 DIAGNOSIS — N429 Disorder of prostate, unspecified: Secondary | ICD-10-CM

## 2014-05-09 DIAGNOSIS — I10 Essential (primary) hypertension: Secondary | ICD-10-CM

## 2014-05-09 DIAGNOSIS — E785 Hyperlipidemia, unspecified: Secondary | ICD-10-CM

## 2014-05-09 MED ORDER — LOSARTAN POTASSIUM-HCTZ 50-12.5 MG PO TABS: 1.0000 | ORAL_TABLET | Freq: Every day | ORAL | Status: DC

## 2014-05-09 NOTE — Progress Notes (Signed)
[facility] [address] [place] [address] [phone]  Date:  05/09/2014   Name:  Bryan [name]   DOB:  22-Dec-1945   MRN:  [id number]  PCP:  Lamar Blinks, MD    Chief Complaint: rx refills and Arm Pain   History of Present Illness:  Bryan [name] is a 68 y.o. very pleasant male patient who presents with the following:  Patient with a history of hypertension and needs refills on his medications. Not fasting.  Has had colonoscopy 2 years ago per patient.  Has pain in left arm that is under evaluation and treatment by the [place].  He is not clear what they have found or have in mind.  No improvement with over the counter medications or other home remedies. Denies other complaint or health concern today.   Patient Active Problem List   Diagnosis Date Noted  . Acute cholecystitis with chronic cholecystitis s/p lap LOA/chole 12/07/2013 12/07/2013  . Obesity (BMI 30-39.9) 12/07/2013  . Refusal of blood transfusions as patient is Jehovah's Witness 12/07/2013  . Rheumatoid factor positive 10/26/2012  . Nonspecific abnormal finding in stool contents 07/28/2012  . Multiple joint pain 06/05/2012  . History of tobacco use-  06/05/2012  . Asthmatic bronchitis 12/15/2011  . BPH (benign prostatic hyperplasia) 12/15/2011  . Essential tremor 12/15/2011  . HTN (hypertension) 12/15/2011    Past Medical History  Diagnosis Date  . Obesity   . HTN (hypertension)   . BPH (benign prostatic hypertrophy)   . Seizures     AS A CHILD.  [name] Ulcer   . Sleep apnea     sleep study, produced known sleep apnea, pt. has not gotten device yet, wants to get his neck fixed 1st   . Hearing deficit     wears hearing aids bilateral  . GERD (gastroesophageal reflux disease)     reports for indigestion he uses mustard   . Arthritis     hands, knees, cervical area  . Essential tremor   . Anxiety     Past Surgical History  Procedure Laterality Date  . Tonsillectomy    . Eye surgery       right, growth excision  . Colonoscopy    . Vasectomy    . Anterior cervical decomp/discectomy fusion N/A 03/30/2013    Procedure: ANTERIOR CERVICAL DECOMPRESSION/DISCECTOMY FUSION 2 LEVELS;  Surgeon: Otilio Connors, MD;  Location: [facility];  Service: Neurosurgery;  Laterality: N/A;  C4-5 C5-6 Anterior cervical decompression/diskectomy/fusion/Allograft/Plate  . Spine surgery    . Wrist ganglion excision Left   . Cholecystectomy N/A 12/07/2013    Procedure: LAPAROSCOPIC CHOLECYSTECTOMY WITH INTRAOPERATIVE CHOLANGIOGRAM;  Surgeon: Adin Hector, MD;  Location: [facility];  Service: General;  Laterality: N/A;    History  Substance Use Topics  . Smoking status: Former Smoker    Types: Cigarettes    Quit date: 12/14/1972  . Smokeless tobacco: Never Used  . Alcohol Use: Yes     Comment: occasionally    Family History  Problem Relation Age of Onset  . Colon cancer Maternal Grandfather   . Diabetes Paternal Aunt     x 4 aunts  . Kidney disease Brother   . Stroke Maternal Grandmother     Allergies  Allergen Reactions  . Ampicillin Nausea Only  . Hydrocodone Other (See Comments)    Hallucinations     Medication list has been reviewed and updated.  Current Outpatient Prescriptions on File Prior to Visit  Medication Sig Dispense Refill  . losartan-hydrochlorothiazide (HYZAAR) 50-12.5 MG per tablet TAKE ONE TABLET BY MOUTH ONCE DAILY-NEEDS TO BE SEEN  15 tablet  0  . meloxicam (MOBIC) 15 MG tablet Take 15 mg by mouth daily.      . Tamsulosin HCl (FLOMAX) 0.4 MG CAPS Take 0.4 mg by mouth at bedtime.       . diazepam (VALIUM) 5 MG tablet Take 1 tablet (5 mg total) by mouth 2 (two) times daily.  15 tablet  0  . gabapentin (NEURONTIN) 100 MG capsule Take 300 mg by mouth 3 (three) times daily.      . methylPREDNISolone (MEDROL DOSEPAK) 4 MG tablet Day 1: 8 mg (2 tablets) breakfast, 4 mg (1 tablet) lunch, 4 mg (1 tablet) supper, and 8 mg (2 tablets) bedtime  Day 2: 4 mg (1 tablet)  breakfast, 4 mg (1 tablet) lunch, 4 mg (1 tablet) supper, and 8 mg (2 tablets) bedtime Day 3: 4 mg (1 tablet) 4 times daily  Day 4: 4 mg (1 tablet) three time daily  Day 5: 4 mg (1 tablet) twice daily Day 6: 4 mg (1 tablet) once  21 tablet  0  . oxyCODONE (OXY IR/ROXICODONE) 5 MG immediate release tablet Take 5 mg by mouth at bedtime as needed for moderate pain or severe pain.      . primidone (MYSOLINE) 50 MG tablet Take 100 mg by mouth daily.        No current facility-administered medications on file prior to visit.    Review of Systems:  As per HPI, otherwise negative.    Physical Examination: Filed Vitals:   05/09/14 0930  BP: 144/70  Pulse: 94  Temp: 98.1 F (36.7 C)  Resp: 18   Filed Vitals:   05/09/14 0930  Height: 5\' 5"  (1.651 m)  Weight: 227 lb (102.967 kg)   Body mass index is 37.77 kg/(m^2). Ideal Body Weight: Weight in (lb) to have BMI = 25: 149.9  GEN: WDWN, NAD, Non-toxic, A & O x 3 HEENT: Atraumatic, Normocephalic. Neck supple. No masses, No LAD. Ears and Nose: No external deformity. CV: RRR, No M/G/R. No JVD. No thrill. No extra heart sounds. PULM: CTA B, no wheezes, crackles, rhonchi. No retractions. No resp. distress. No accessory muscle use. ABD: S, NT, ND, +BS. No rebound. No HSM. EXTR: No c/c/e NEURO Normal gait.  PSYCH: Normally interactive. Conversant. Not depressed or anxious appearing.  Calm demeanor.    Assessment and Plan: Hypertension Borderline hyperlipidemia Labs pending Shoulder pain Obtain records from [place]   Signed,  Ellison Carwin, MD

## 2014-05-09 NOTE — Addendum Note (Signed)
Addended by: Madie Reno on: 05/09/2014 11:12 AM   Modules accepted: Orders

## 2014-05-09 NOTE — Patient Instructions (Signed)
Hypertension Hypertension, commonly called high blood pressure, is when the force of blood pumping through your arteries is too strong. Your arteries are the blood vessels that carry blood from your heart throughout your body. A blood pressure reading consists of a higher number over a lower number, such as 110/72. The higher number (systolic) is the pressure inside your arteries when your heart pumps. The lower number (diastolic) is the pressure inside your arteries when your heart relaxes. Ideally you want your blood pressure below 120/80. Hypertension forces your heart to work harder to pump blood. Your arteries may become narrow or stiff. Having hypertension puts you at risk for heart disease, stroke, and other problems.  RISK FACTORS Some risk factors for high blood pressure are controllable. Others are not.  Risk factors you cannot control include:   Race. You may be at higher risk if you are African American.  Age. Risk increases with age.  Gender. Men are at higher risk than women before age 45 years. After age 65, women are at higher risk than men. Risk factors you can control include:  Not getting enough exercise or physical activity.  Being overweight.  Getting too much fat, sugar, calories, or salt in your diet.  Drinking too much alcohol. SIGNS AND SYMPTOMS Hypertension does not usually cause signs or symptoms. Extremely high blood pressure (hypertensive crisis) may cause headache, anxiety, shortness of breath, and nosebleed. DIAGNOSIS  To check if you have hypertension, your health care provider will measure your blood pressure while you are seated, with your arm held at the level of your heart. It should be measured at least twice using the same arm. Certain conditions can cause a difference in blood pressure between your right and left arms. A blood pressure reading that is higher than normal on one occasion does not mean that you need treatment. If one blood pressure reading  is high, ask your health care provider about having it checked again. TREATMENT  Treating high blood pressure includes making lifestyle changes and possibly taking medication. Living a healthy lifestyle can help lower high blood pressure. You may need to change some of your habits. Lifestyle changes may include:  Following the DASH diet. This diet is high in fruits, vegetables, and whole grains. It is low in salt, red meat, and added sugars.  Getting at least 2 1/2 hours of brisk physical activity every week.  Losing weight if necessary.  Not smoking.  Limiting alcoholic beverages.  Learning ways to reduce stress. If lifestyle changes are not enough to get your blood pressure under control, your health care provider may prescribe medicine. You may need to take more than one. Work closely with your health care provider to understand the risks and benefits. HOME CARE INSTRUCTIONS  Have your blood pressure rechecked as directed by your health care provider.   Only take medicine as directed by your health care provider. Follow the directions carefully. Blood pressure medicines must be taken as prescribed. The medicine does not work as well when you skip doses. Skipping doses also puts you at risk for problems.   Do not smoke.   Monitor your blood pressure at home as directed by your health care provider. SEEK MEDICAL CARE IF:   You think you are having a reaction to medicines taken.  You have recurrent headaches or feel dizzy.  You have swelling in your ankles.  You have trouble with your vision. SEEK IMMEDIATE MEDICAL CARE IF:  You develop a severe headache or   confusion.  You have unusual weakness, numbness, or feel faint.  You have severe chest or abdominal pain.  You vomit repeatedly.  You have trouble breathing. MAKE SURE YOU:   Understand these instructions.  Will watch your condition.  Will get help right away if you are not doing well or get  worse. Document Released: 10/29/2005 Document Revised: 11/03/2013 Document Reviewed: 08/21/2013 [employer] Patient Information 2015 [employer]. This information is not intended to replace advice given to you by your health care provider. Make sure you discuss any questions you have with your health care provider.  

## 2014-05-12 ENCOUNTER — Other Ambulatory Visit (INDEPENDENT_AMBULATORY_CARE_PROVIDER_SITE_OTHER): Payer: Medicare Other | Admitting: Radiology

## 2014-05-12 DIAGNOSIS — E785 Hyperlipidemia, unspecified: Secondary | ICD-10-CM

## 2014-05-12 DIAGNOSIS — I10 Essential (primary) hypertension: Secondary | ICD-10-CM

## 2014-05-12 DIAGNOSIS — N429 Disorder of prostate, unspecified: Secondary | ICD-10-CM

## 2014-05-12 LAB — LIPID PANEL
CHOLESTEROL: 204 mg/dL — AB (ref 0–200)
HDL: 57 mg/dL (ref >39)
LDL Cholesterol: 128 mg/dL — ABNORMAL HIGH (ref 0–99)
TRIGLYCERIDES: 95 mg/dL (ref <150)
Total CHOL/HDL Ratio: 3.6 Ratio
VLDL: 19 mg/dL (ref 0–40)

## 2014-05-12 LAB — COMPREHENSIVE METABOLIC PANEL
ALBUMIN: 3.9 g/dL (ref 3.5–5.2)
ALK PHOS: 147 U/L — AB (ref 39–117)
ALT: 12 U/L (ref 0–53)
AST: 19 U/L (ref 0–37)
BUN: 19 mg/dL (ref 6–23)
CALCIUM: 9 mg/dL (ref 8.4–10.5)
CHLORIDE: 99 meq/L (ref 96–112)
CO2: 27 mEq/L (ref 19–32)
Creat: 0.96 mg/dL (ref 0.50–1.35)
Glucose, Bld: 80 mg/dL (ref 70–99)
POTASSIUM: 4.1 meq/L (ref 3.5–5.3)
Sodium: 142 mEq/L (ref 135–145)
Total Bilirubin: 0.4 mg/dL (ref 0.2–1.2)
Total Protein: 7.5 g/dL (ref 6.0–8.3)

## 2014-05-13 ENCOUNTER — Ambulatory Visit (INDEPENDENT_AMBULATORY_CARE_PROVIDER_SITE_OTHER): Payer: Medicare Other | Admitting: Family Medicine

## 2014-05-13 VITALS — BP 133/77 | HR 79 | Temp 98.1°F | Resp 18 | Ht 65.0 in | Wt 227.0 lb

## 2014-05-13 DIAGNOSIS — M79609 Pain in unspecified limb: Secondary | ICD-10-CM

## 2014-05-13 DIAGNOSIS — M62838 Other muscle spasm: Secondary | ICD-10-CM

## 2014-05-13 DIAGNOSIS — M542 Cervicalgia: Secondary | ICD-10-CM

## 2014-05-13 DIAGNOSIS — M25512 Pain in left shoulder: Secondary | ICD-10-CM

## 2014-05-13 DIAGNOSIS — M25519 Pain in unspecified shoulder: Secondary | ICD-10-CM

## 2014-05-13 DIAGNOSIS — M47812 Spondylosis without myelopathy or radiculopathy, cervical region: Secondary | ICD-10-CM

## 2014-05-13 DIAGNOSIS — M79602 Pain in left arm: Secondary | ICD-10-CM

## 2014-05-13 LAB — PSA, MEDICARE: PSA: 1.28 ng/mL (ref <=4.00)

## 2014-05-13 MED ORDER — DIAZEPAM 5 MG PO TABS: 2.5000 mg | ORAL_TABLET | Freq: Two times a day (BID) | ORAL | Status: DC | PRN

## 2014-05-13 MED ORDER — METHYLPREDNISOLONE 4 MG PO KIT: PACK | ORAL | Status: DC

## 2014-05-13 NOTE — Patient Instructions (Signed)
We can try the same treatments as effective when seen in ER in May. I suspect this is largely coming form your neck.  Bring a copy of the MRI report, and then depending on relief of symptoms with this round of medications, may need to see specialist in August in planned or sooner if needed. You may also have some tendon irritation in the shoulder - the steroid should also help this, but return for recheck in next 2 weeks if this is not improving.  Return to the clinic or go to the nearest emergency room if any of your symptoms worsen or new symptoms occur.

## 2014-05-13 NOTE — Progress Notes (Signed)
Subjective:    Patient ID: Bryan [name], male    DOB: 08-Apr-1946, 68 y.o.   MRN: [id number]  HPI Bryan [name] is a 68 y.o. male  L shoulder pain, upper back to neck -started about 3 months ago.  Worse to lay back - excruciating at night.  Seen at [facility] May 31st - prescribed valium 5mg  BID # 15, and medrol dosepak. Felt like these helped - stopped pain for about a month, then flared back up past 2 weeks. Seen by [facility] hospital doctor about a month ago - had MRI - did not bring report, but has CD of images - Told would get back with him.  Scheduled to see neurosurgeon 06/28/14. Difficult history - not sure what area had MRI of - cd noted - cervical spine, but will bring report.   Feels like less strength in arms - tried to pick up a child and she felt heavy. Able to use arms, but feels like less strength with lifting. Hurts to lift arm to drive.  No parasthesias/dysesthesias. Grip strength ok.    Anterior cervical decomp/discectomy fusion 03/30/2013: Procedure: ANTERIOR CERVICAL DECOMPRESSION/DISCECTOMY FUSION 2 LEVELS; Surgeon: Otilio Connors, MD; Location: [place] [facility]; Service: Neurosurgery; Laterality: N/A; C4-5 C5-6 Anterior cervical decompression/diskectomy/fusion/Allograft/Plate     Patient Active Problem List   Diagnosis Date Noted  . Acute cholecystitis with chronic cholecystitis s/p lap LOA/chole 12/07/2013 12/07/2013  . Obesity (BMI 30-39.9) 12/07/2013  . Refusal of blood transfusions as patient is Jehovah's Witness 12/07/2013  . Rheumatoid factor positive 10/26/2012  . Nonspecific abnormal finding in stool contents 07/28/2012  . Multiple joint pain 06/05/2012  . History of tobacco use-  06/05/2012  . Asthmatic bronchitis 12/15/2011  . BPH (benign prostatic hyperplasia) 12/15/2011  . Essential tremor 12/15/2011  . HTN (hypertension) 12/15/2011   Past Medical History  Diagnosis Date  . Obesity   . HTN (hypertension)   . BPH (benign prostatic hypertrophy)   . Seizures     AS  A CHILD.  [name] Ulcer   . Sleep apnea     sleep study, produced known sleep apnea, pt. has not gotten device yet, wants to get his neck fixed 1st   . Hearing deficit     wears hearing aids bilateral  . GERD (gastroesophageal reflux disease)     reports for indigestion he uses mustard   . Arthritis     hands, knees, cervical area  . Essential tremor   . Anxiety    Past Surgical History  Procedure Laterality Date  . Tonsillectomy    . Eye surgery      right, growth excision  . Colonoscopy    . Vasectomy    . Anterior cervical decomp/discectomy fusion N/A 03/30/2013    Procedure: ANTERIOR CERVICAL DECOMPRESSION/DISCECTOMY FUSION 2 LEVELS;  Surgeon: Otilio Connors, MD;  Location: [place] [facility];  Service: Neurosurgery;  Laterality: N/A;  C4-5 C5-6 Anterior cervical decompression/diskectomy/fusion/Allograft/Plate  . Spine surgery    . Wrist ganglion excision Left   . Cholecystectomy N/A 12/07/2013    Procedure: LAPAROSCOPIC CHOLECYSTECTOMY WITH INTRAOPERATIVE CHOLANGIOGRAM;  Surgeon: Adin Hector, MD;  Location: [facility];  Service: General;  Laterality: N/A;   Allergies  Allergen Reactions  . Ampicillin Nausea Only  . Hydrocodone Other (See Comments)    Hallucinations    Prior to Admission medications   Medication Sig Start Date End Date Taking? Authorizing Provider  diazepam (VALIUM) 5 MG tablet Take 1 tablet (5 mg total) by  mouth 2 (two) times daily. 04/11/14  Yes Lucila [place], PA-C  gabapentin (NEURONTIN) 100 MG capsule Take 300 mg by mouth 3 (three) times daily.   Yes Historical Provider, MD  losartan-hydrochlorothiazide (HYZAAR) 50-12.5 MG per tablet Take 1 tablet by mouth daily. 05/09/14  Yes Ellison Carwin, MD  meloxicam (MOBIC) 15 MG tablet Take 15 mg by mouth daily.   Yes Historical Provider, MD  methylPREDNISolone (MEDROL DOSEPAK) 4 MG tablet Day 1: 8 mg (2 tablets) breakfast, 4 mg (1 tablet) lunch, 4 mg (1 tablet) supper, and 8 mg (2 tablets) bedtime  Day 2: 4 mg (1 tablet)  breakfast, 4 mg (1 tablet) lunch, 4 mg (1 tablet) supper, and 8 mg (2 tablets) bedtime Day 3: 4 mg (1 tablet) 4 times daily  Day 4: 4 mg (1 tablet) three time daily  Day 5: 4 mg (1 tablet) twice daily Day 6: 4 mg (1 tablet) once 04/11/14  Yes Lucila [place], PA-C  oxyCODONE (OXY IR/ROXICODONE) 5 MG immediate release tablet Take 5 mg by mouth at bedtime as needed for moderate pain or severe pain.   Yes Historical Provider, MD  primidone (MYSOLINE) 50 MG tablet Take 100 mg by mouth daily.  04/05/13  Yes Darlyne Russian, MD  Tamsulosin HCl (FLOMAX) 0.4 MG CAPS Take 0.4 mg by mouth at bedtime.    Yes Historical Provider, MD   History   Social History  . Marital Status: Married    Spouse Name: N/A    Number of Children: 2  . Years of Education: N/A   Occupational History  . bus driver    Social History Main Topics  . Smoking status: Former Smoker    Types: Cigarettes    Quit date: 12/14/1972  . Smokeless tobacco: Never Used  . Alcohol Use: Yes     Comment: occasionally  . Drug Use: No  . Sexual Activity: Yes    Birth Control/ Protection: None   Other Topics Concern  . Not on file   Social History Narrative  . No narrative on file       Review of Systems  Constitutional: Negative for fever and chills.  Respiratory: Negative for shortness of breath.   Musculoskeletal: Positive for arthralgias, neck pain and neck stiffness.  Skin: Negative for color change and rash.  Neurological: Positive for weakness. Negative for dizziness and light-headedness.       Objective:   Physical Exam  Vitals reviewed. Constitutional: He is oriented to person, place, and time. He appears well-developed and well-nourished. No distress.  HENT:  Head: Normocephalic and atraumatic.  Pulmonary/Chest: Effort normal and breath sounds normal.  Musculoskeletal:       Left shoulder: He exhibits decreased range of motion (pain, guarded with internal rotation, and guarded abduction over 80 degrees. NVI  distaly.  ).       Cervical back: He exhibits decreased range of motion, tenderness, pain and spasm. He exhibits no bony tenderness.       Back:  Neurological: He is alert and oriented to person, place, and time. He has normal strength. No sensory deficit.  Reflex Scores:      Tricep reflexes are 2+ on the right side and 2+ on the left side.      Bicep reflexes are 2+ on the right side and 2+ on the left side.      Brachioradialis reflexes are 2+ on the right side and 2+ on the left side. Equal grip, biceps, triceps strength bilaterally.  Reflexes also appear equal.   Skin: Skin is warm and dry. No rash noted.  Psychiatric: He has a normal mood and affect. His behavior is normal.   Filed Vitals:   05/13/14 1004  BP: 133/77  Pulse: 79  Temp: 98.1 F (36.7 C)  TempSrc: Oral  Resp: 18  Height: 5\' 5"  (1.651 m)  Weight: 227 lb (102.967 kg)  SpO2: 95%      Assessment & Plan:   Bryan [name] is a 68 y.o. male Muscle spasms of neck - Plan: methylPREDNISolone (MEDROL DOSEPAK) 4 MG tablet, diazepam (VALIUM) 5 MG tablet  Neck pain - Plan: methylPREDNISolone (MEDROL DOSEPAK) 4 MG tablet, diazepam (VALIUM) 5 MG tablet  Left shoulder pain - Plan: methylPREDNISolone (MEDROL DOSEPAK) 4 MG tablet, diazepam (VALIUM) 5 MG tablet  Left arm pain - Plan: methylPREDNISolone (MEDROL DOSEPAK) 4 MG tablet, diazepam (VALIUM) 5 MG tablet  Degenerative arthritis of cervical spine - Plan: methylPREDNISolone (MEDROL DOSEPAK) 4 MG tablet, diazepam (VALIUM) 5 MG tablet   -suspected degenerative cervical disease as primary contributor of pain, but with some shoulder ROM guarding, may have a component of RTC tendinosis or GH OA as well. Similar sx's in may relieved with Medrol Dose pak and Valium as muscle relaxant - agreed to try both of those again today (SED, and cautioned on sedation and fall risk of valium). Understanding expressed, and can try lowest effective dose. He will bring MRI report back for me to  review. Depending on relief with this treatment - may need to move up appt with neurosurgery. rtc precautions.   Meds ordered this encounter  Medications  . methylPREDNISolone (MEDROL DOSEPAK) 4 MG tablet    Sig: Day 1: 8 mg (2 tablets) breakfast, 4 mg (1 tablet) lunch, 4 mg (1 tablet) supper, and 8 mg (2 tablets) bedtime  Day 2: 4 mg (1 tablet) breakfast, 4 mg (1 tablet) lunch, 4 mg (1 tablet) supper, and 8 mg (2 tablets) bedtime Day 3: 4 mg (1 tablet) 4 times daily  Day 4: 4 mg (1 tablet) three time daily  Day 5: 4 mg (1 tablet) twice daily Day 6: 4 mg (1 tablet) once    Dispense:  21 tablet    Refill:  0    Order Specific Question:  Supervising Provider    Answer:  Noemi Chapel D [[id number]]  . diazepam (VALIUM) 5 MG tablet    Sig: Take 0.5-1 tablets (2.5-5 mg total) by mouth 2 (two) times daily as needed for muscle spasms.    Dispense:  15 tablet    Refill:  0    Order Specific Question:  Supervising Provider    Answer:  Noemi Chapel D [[id number]]   Patient Instructions  We can try the same treatments as effective when seen in ER in May. I suspect this is largely coming form your neck.  Bring a copy of the MRI report, and then depending on relief of symptoms with this round of medications, may need to see specialist in August in planned or sooner if needed. You may also have some tendon irritation in the shoulder - the steroid should also help this, but return for recheck in next 2 weeks if this is not improving.  Return to the clinic or go to the nearest emergency room if any of your symptoms worsen or new symptoms occur.

## 2014-06-27 ENCOUNTER — Ambulatory Visit (INDEPENDENT_AMBULATORY_CARE_PROVIDER_SITE_OTHER): Payer: Medicare Other | Admitting: Family Medicine

## 2014-06-27 ENCOUNTER — Ambulatory Visit (INDEPENDENT_AMBULATORY_CARE_PROVIDER_SITE_OTHER): Payer: Medicare Other

## 2014-06-27 VITALS — BP 140/80 | HR 116 | Temp 100.1°F | Resp 16 | Ht 64.0 in | Wt 226.6 lb

## 2014-06-27 DIAGNOSIS — R059 Cough, unspecified: Secondary | ICD-10-CM

## 2014-06-27 DIAGNOSIS — I499 Cardiac arrhythmia, unspecified: Secondary | ICD-10-CM

## 2014-06-27 DIAGNOSIS — R05 Cough: Secondary | ICD-10-CM

## 2014-06-27 DIAGNOSIS — M542 Cervicalgia: Secondary | ICD-10-CM

## 2014-06-27 DIAGNOSIS — H9190 Unspecified hearing loss, unspecified ear: Secondary | ICD-10-CM

## 2014-06-27 DIAGNOSIS — M25512 Pain in left shoulder: Secondary | ICD-10-CM

## 2014-06-27 DIAGNOSIS — M25519 Pain in unspecified shoulder: Secondary | ICD-10-CM

## 2014-06-27 DIAGNOSIS — J189 Pneumonia, unspecified organism: Secondary | ICD-10-CM

## 2014-06-27 LAB — POCT CBC
Granulocyte percent: 80.3 %G — AB (ref 37–80)
HCT, POC: 43.9 % (ref 43.5–53.7)
Hemoglobin: 13.5 g/dL — AB (ref 14.1–18.1)
Lymph, poc: 1.4 (ref 0.6–3.4)
MCH, POC: 29.1 pg (ref 27–31.2)
MCHC: 30.8 g/dL — AB (ref 31.8–35.4)
MCV: 94.6 fL (ref 80–97)
MID (cbc): 0.6 (ref 0–0.9)
MPV: 7.1 fL (ref 0–99.8)
POC Granulocyte: 8.3 — AB (ref 2–6.9)
POC LYMPH PERCENT: 13.5 %L (ref 10–50)
POC MID %: 6.2 %M (ref 0–12)
Platelet Count, POC: 231 10*3/uL (ref 142–424)
RBC: 4.64 M/uL — AB (ref 4.69–6.13)
RDW, POC: 18.4 %
WBC: 10.3 10*3/uL — AB (ref 4.6–10.2)

## 2014-06-27 LAB — COMPREHENSIVE METABOLIC PANEL
ALT: 16 U/L (ref 0–53)
AST: 17 U/L (ref 0–37)
Albumin: 3.8 g/dL (ref 3.5–5.2)
Alkaline Phosphatase: 120 U/L — ABNORMAL HIGH (ref 39–117)
BUN: 15 mg/dL (ref 6–23)
CO2: 31 mEq/L (ref 19–32)
Calcium: 8.7 mg/dL (ref 8.4–10.5)
Chloride: 97 mEq/L (ref 96–112)
Creat: 1.12 mg/dL (ref 0.50–1.35)
Glucose, Bld: 101 mg/dL — ABNORMAL HIGH (ref 70–99)
Potassium: 4.1 mEq/L (ref 3.5–5.3)
Sodium: 136 mEq/L (ref 135–145)
Total Bilirubin: 0.5 mg/dL (ref 0.2–1.2)
Total Protein: 7 g/dL (ref 6.0–8.3)

## 2014-06-27 MED ORDER — BENZONATATE 200 MG PO CAPS: 200.0000 mg | ORAL_CAPSULE | Freq: Three times a day (TID) | ORAL | Status: DC | PRN

## 2014-06-27 MED ORDER — LEVOFLOXACIN 500 MG PO TABS: 500.0000 mg | ORAL_TABLET | Freq: Every day | ORAL | Status: DC

## 2014-06-27 MED ORDER — CEFTRIAXONE SODIUM 1 G IJ SOLR
1.0000 g | Freq: Once | INTRAMUSCULAR | Status: AC
  Administered 2014-06-27: 1 g via INTRAMUSCULAR

## 2014-06-27 MED ORDER — CEFTRIAXONE SODIUM 1 G IJ SOLR: 1.0000 g | INTRAMUSCULAR | Status: DC

## 2014-06-27 NOTE — Patient Instructions (Signed)
Return Tuesday evening   Pneumonia Pneumonia is an infection of the lungs.  CAUSES Pneumonia may be caused by bacteria or a virus. Usually, these infections are caused by breathing infectious particles into the lungs (respiratory tract). SIGNS AND SYMPTOMS   Cough.  Fever.  Chest pain.  Increased rate of breathing.  Wheezing.  Mucus production. DIAGNOSIS  If you have the common symptoms of pneumonia, your health care provider will typically confirm the diagnosis with a chest X-ray. The X-ray will show an abnormality in the lung (pulmonary infiltrate) if you have pneumonia. Other tests of your blood, urine, or sputum may be done to find the specific cause of your pneumonia. Your health care provider may also do tests (blood gases or pulse oximetry) to see how well your lungs are working. TREATMENT  Some forms of pneumonia may be spread to other people when you cough or sneeze. You may be asked to wear a mask before and during your exam. Pneumonia that is caused by bacteria is treated with antibiotic medicine. Pneumonia that is caused by the influenza virus may be treated with an antiviral medicine. Most other viral infections must run their course. These infections will not respond to antibiotics.  HOME CARE INSTRUCTIONS   Cough suppressants may be used if you are losing too much rest. However, coughing protects you by clearing your lungs. You should avoid using cough suppressants if you can.  Your health care provider may have prescribed medicine if he or she thinks your pneumonia is caused by bacteria or influenza. Finish your medicine even if you start to feel better.  Your health care provider may also prescribe an expectorant. This loosens the mucus to be coughed up.  Take medicines only as directed by your health care provider.  Do not smoke. Smoking is a common cause of bronchitis and can contribute to pneumonia. If you are a smoker and continue to smoke, your cough may last  several weeks after your pneumonia has cleared.  A cold steam vaporizer or humidifier in your room or home may help loosen mucus.  Coughing is often worse at night. Sleeping in a semi-upright position in a recliner or using a couple pillows under your head will help with this.  Get rest as you feel it is needed. Your body will usually let you know when you need to rest. PREVENTION A pneumococcal shot (vaccine) is available to prevent a common bacterial cause of pneumonia. This is usually suggested for:  People over 44 years old.  Patients on chemotherapy.  People with chronic lung problems, such as bronchitis or emphysema.  People with immune system problems. If you are over 65 or have a high risk condition, you may receive the pneumococcal vaccine if you have not received it before. In some countries, a routine influenza vaccine is also recommended. This vaccine can help prevent some cases of pneumonia.You may be offered the influenza vaccine as part of your care. If you smoke, it is time to quit. You may receive instructions on how to stop smoking. Your health care provider can provide medicines and counseling to help you quit. SEEK MEDICAL CARE IF: You have a fever. SEEK IMMEDIATE MEDICAL CARE IF:   Your illness becomes worse. This is especially true if you are elderly or weakened from any other disease.  You cannot control your cough with suppressants and are losing sleep.  You begin coughing up blood.  You develop pain which is getting worse or is uncontrolled with medicines.  Any of the symptoms which initially brought you in for treatment are getting worse rather than better.  You develop shortness of breath or chest pain. MAKE SURE YOU:   Understand these instructions.  Will watch your condition.  Will get help right away if you are not doing well or get worse. Document Released: 10/29/2005 Document Revised: 03/15/2014 Document Reviewed: 01/18/2011 [facility], [facility], [facility] Patient  Information 2015 [place], [place]. This information is not intended to replace advice given to you by your health care provider. Make sure you discuss any questions you have with your health care provider.

## 2014-06-27 NOTE — Progress Notes (Signed)
Subjective:    Patient ID: Bryan [name], male    DOB: 1946/07/06, 68 y.o.   MRN: [id number] This chart was scribed for Wardell Honour, MD by Steva Colder, ED Scribe. The patient was seen in room Room/bed info not found at 11:39 AM.   Chief Complaint  Patient presents with  . URI    coughing, nasal congestion, since last Tuesday    HPI HPI Comments: Bryan [name] is a 68 y.o. male who presents to the Emergency Department complaining of URI onset last tuesday. He states that he is having associated symptoms of congestion, cough.   He denies fever, SOB, or chest pain . He denies any heart rate problems. He denies being a current smoker. He states that he smoked 40 years ago.   he states that he went to [facility] last week. He states that since that visit he has had a productive cough. He states that the cough is productive of yellow sputum.  Patient Active Problem List   Diagnosis Date Noted  . Acute cholecystitis with chronic cholecystitis s/p lap LOA/chole 12/07/2013 12/07/2013  . Obesity (BMI 30-39.9) 12/07/2013  . Refusal of blood transfusions as patient is Jehovah's Witness 12/07/2013  . Rheumatoid factor positive 10/26/2012  . Nonspecific abnormal finding in stool contents 07/28/2012  . Multiple joint pain 06/05/2012  . History of tobacco use-  06/05/2012  . Asthmatic bronchitis 12/15/2011  . BPH (benign prostatic hyperplasia) 12/15/2011  . Essential tremor 12/15/2011  . HTN (hypertension) 12/15/2011   Past Medical History  Diagnosis Date  . Obesity   . HTN (hypertension)   . BPH (benign prostatic hypertrophy)   . Seizures     AS A CHILD.  [name] Ulcer   . Sleep apnea     sleep study, produced known sleep apnea, pt. has not gotten device yet, wants to get his neck fixed 1st   . Hearing deficit     wears hearing aids bilateral  . GERD (gastroesophageal reflux disease)     reports for indigestion he uses mustard   . Arthritis     hands, knees, cervical area  .  Essential tremor   . Anxiety    Past Surgical History  Procedure Laterality Date  . Tonsillectomy    . Eye surgery      right, growth excision  . Colonoscopy    . Vasectomy    . Anterior cervical decomp/discectomy fusion N/A 03/30/2013    Procedure: ANTERIOR CERVICAL DECOMPRESSION/DISCECTOMY FUSION 2 LEVELS;  Surgeon: Otilio Connors, MD;  Location: [facility];  Service: Neurosurgery;  Laterality: N/A;  C4-5 C5-6 Anterior cervical decompression/diskectomy/fusion/Allograft/Plate  . Spine surgery    . Wrist ganglion excision Left   . Cholecystectomy N/A 12/07/2013    Procedure: LAPAROSCOPIC CHOLECYSTECTOMY WITH INTRAOPERATIVE CHOLANGIOGRAM;  Surgeon: Adin Hector, MD;  Location: [facility];  Service: General;  Laterality: N/A;   Allergies  Allergen Reactions  . Ampicillin Nausea Only  . Hydrocodone Other (See Comments)    Hallucinations    Prior to Admission medications   Medication Sig Start Date End Date Taking? Authorizing Provider  losartan-hydrochlorothiazide (HYZAAR) 50-12.5 MG per tablet Take 1 tablet by mouth daily. 05/09/14  Yes Ellison Carwin, MD  meloxicam (MOBIC) 15 MG tablet Take 15 mg by mouth daily.   Yes Historical Provider, MD  methylPREDNISolone (MEDROL DOSEPAK) 4 MG tablet Day 1: 8 mg (2 tablets) breakfast, 4 mg (1 tablet) lunch, 4 mg (1 tablet) supper, and 8 mg (  2 tablets) bedtime  Day 2: 4 mg (1 tablet) breakfast, 4 mg (1 tablet) lunch, 4 mg (1 tablet) supper, and 8 mg (2 tablets) bedtime Day 3: 4 mg (1 tablet) 4 times daily  Day 4: 4 mg (1 tablet) three time daily  Day 5: 4 mg (1 tablet) twice daily Day 6: 4 mg (1 tablet) once 05/13/14  Yes Wendie Agreste, MD  primidone (MYSOLINE) 50 MG tablet Take 100 mg by mouth daily.  04/05/13  Yes Darlyne Russian, MD  Tamsulosin HCl (FLOMAX) 0.4 MG CAPS Take 0.4 mg by mouth at bedtime.    Yes Historical Provider, MD  diazepam (VALIUM) 5 MG tablet Take 0.5-1 tablets (2.5-5 mg total) by mouth 2 (two) times daily as needed for  muscle spasms. 05/13/14   Wendie Agreste, MD  gabapentin (NEURONTIN) 100 MG capsule Take 300 mg by mouth 3 (three) times daily.    Historical Provider, MD  oxyCODONE (OXY IR/ROXICODONE) 5 MG immediate release tablet Take 5 mg by mouth at bedtime as needed for moderate pain or severe pain.    Historical Provider, MD     Review of Systems  Constitutional: Negative for fever.  Respiratory: Positive for cough. Negative for shortness of breath.        Objective:   Physical Exam  Nursing note and vitals reviewed. Constitutional: He is oriented to person, place, and time. He appears well-developed and well-nourished. No distress.  HENT:  Head: Normocephalic and atraumatic.  Eyes: EOM are normal.  Neck: Neck supple. No tracheal deviation present.  Cardiovascular: Normal rate.   Pulmonary/Chest: Effort normal. No respiratory distress. He has rales (left chest).  Musculoskeletal: Normal range of motion.  Neurological: He is alert and oriented to person, place, and time.  Skin: Skin is warm and dry.  Psychiatric: He has a normal mood and affect. His behavior is normal.   Pulse is irregular at about 100 Left sided rales and ronchi  EKG:  Rate 118,  Occasional PVC [facility] reading (PRIMARY) by  Dr. Joseph Art CXR .left peribronchial pneumonia Results for orders placed in visit on 06/27/14  POCT CBC      Result Value Ref Range   WBC 10.3 (*) 4.6 - 10.2 K/uL   Lymph, poc 1.4  0.6 - 3.4   POC LYMPH PERCENT 13.5  10 - 50 %L   MID (cbc) 0.6  0 - 0.9   POC MID % 6.2  0 - 12 %M   POC Granulocyte 8.3 (*) 2 - 6.9   Granulocyte percent 80.3 (*) 37 - 80 %G   RBC 4.64 (*) 4.69 - 6.13 M/uL   Hemoglobin 13.5 (*) 14.1 - 18.1 g/dL   HCT, POC 43.9  43.5 - 53.7 %   MCV 94.6  80 - 97 fL   MCH, POC 29.1  27 - 31.2 pg   MCHC 30.8 (*) 31.8 - 35.4 g/dL   RDW, POC 18.4     Platelet Count, POC 231  142 - 424 K/uL   MPV 7.1  0 - 99.8 fL        Assessment & Plan:  DIAGNOSTIC STUDIES: Oxygen Saturation is  89% on room air, hypoxic by my interpretation.    COORDINATION OF CARE: 11:44 AM-Discussed treatment plan which includes returning if worsening with pt at bedside and pt agreed to plan.  Cough - Plan: POCT CBC, Comprehensive metabolic panel, DG Chest 2 View, levofloxacin (LEVAQUIN) 500 MG tablet, benzonatate (TESSALON) 200 MG capsule  Irregular  heart rate - Plan: POCT CBC, Comprehensive metabolic panel, EKG 71-IWPY, DG Chest 2 View  Hearing impaired, unspecified laterality  CAP (community acquired pneumonia) - Plan: cefTRIAXone (ROCEPHIN) injection 1 g, DISCONTINUED: cefTRIAXone (ROCEPHIN) injection 1 g  Neck pain  Left shoulder pain  Signed, Robyn Haber, MD

## 2014-07-04 ENCOUNTER — Ambulatory Visit (INDEPENDENT_AMBULATORY_CARE_PROVIDER_SITE_OTHER): Payer: Medicare Other | Admitting: Family Medicine

## 2014-07-04 VITALS — BP 112/70 | HR 88 | Temp 98.0°F | Resp 16 | Ht 65.0 in | Wt 228.0 lb

## 2014-07-04 DIAGNOSIS — G4733 Obstructive sleep apnea (adult) (pediatric): Secondary | ICD-10-CM

## 2014-07-04 DIAGNOSIS — D649 Anemia, unspecified: Secondary | ICD-10-CM

## 2014-07-04 DIAGNOSIS — J22 Unspecified acute lower respiratory infection: Secondary | ICD-10-CM

## 2014-07-04 DIAGNOSIS — J988 Other specified respiratory disorders: Secondary | ICD-10-CM

## 2014-07-04 NOTE — Patient Instructions (Signed)
Please followup with the [facility] for anemia and also to get your CPAP for Sleep Apnea

## 2014-07-04 NOTE — Progress Notes (Signed)
Chief Complaint:  Chief Complaint  Patient presents with  . Follow-up    Pneumonia    HPI: Bryan [name] is a 68 y.o. male who is here for  Recheck of his URI sxs, he is feeling much better, he is not coughing as much. He has some low energy, he ahs not done much. He feels overall better.  Last colonscopy was last year and was normal  CLINICAL DATA: URI which began 5 days ago, cough, congestion,  history hypertension  EXAM:  CHEST 2 VIEW  COMPARISON: 12/07/2013  FINDINGS:  Normal heart size and pulmonary vascularity.  Tortuous thoracic aorta.  Lungs clear.  No pleural effusion or pneumothorax.  Bones unremarkable.  IMPRESSION:  No acute abnormalities.  Electronically Signed  By: Lavonia Dana M.D.  On: 06/27/2014 12:44   Past Medical History  Diagnosis Date  . Obesity   . HTN (hypertension)   . BPH (benign prostatic hypertrophy)   . Seizures     AS A CHILD.  [name] Ulcer   . Sleep apnea     sleep study, produced known sleep apnea, pt. has not gotten device yet, wants to get his neck fixed 1st   . Hearing deficit     wears hearing aids bilateral  . GERD (gastroesophageal reflux disease)     reports for indigestion he uses mustard   . Arthritis     hands, knees, cervical area  . Essential tremor   . Anxiety    Past Surgical History  Procedure Laterality Date  . Tonsillectomy    . Eye surgery      right, growth excision  . Colonoscopy    . Vasectomy    . Anterior cervical decomp/discectomy fusion N/A 03/30/2013    Procedure: ANTERIOR CERVICAL DECOMPRESSION/DISCECTOMY FUSION 2 LEVELS;  Surgeon: Otilio Connors, MD;  Location: [place] [facility];  Service: Neurosurgery;  Laterality: N/A;  C4-5 C5-6 Anterior cervical decompression/diskectomy/fusion/Allograft/Plate  . Spine surgery    . Wrist ganglion excision Left   . Cholecystectomy N/A 12/07/2013    Procedure: LAPAROSCOPIC CHOLECYSTECTOMY WITH INTRAOPERATIVE CHOLANGIOGRAM;  Surgeon: Adin Hector, MD;  Location: [facility];  Service: General;  Laterality: N/A;   History   Social History  . Marital Status: Married    Spouse Name: N/A    Number of Children: 2  . Years of Education: N/A   Occupational History  . bus driver    Social History Main Topics  . Smoking status: Former Smoker    Types: Cigarettes    Quit date: 12/14/1972  . Smokeless tobacco: Never Used  . Alcohol Use: Yes     Comment: occasionally  . Drug Use: No  . Sexual Activity: Yes    Birth Control/ Protection: None   Other Topics Concern  . None   Social History Narrative  . None   Family History  Problem Relation Age of Onset  . Colon cancer Maternal Grandfather   . Diabetes Paternal Aunt     x 4 aunts  . Kidney disease Brother   . Stroke Maternal Grandmother    Allergies  Allergen Reactions  . Ampicillin Nausea Only  . Hydrocodone Other (See Comments)    Hallucinations    Prior to Admission medications   Medication Sig Start Date End Date Taking? Authorizing Provider  losartan-hydrochlorothiazide (HYZAAR) 50-12.5 MG per tablet Take 1 tablet by mouth daily. 05/09/14  Yes Ellison Carwin, MD  meloxicam (MOBIC) 15 MG tablet Take 15 mg by  mouth daily.   Yes Historical Provider, MD  primidone (MYSOLINE) 50 MG tablet Take 100 mg by mouth daily.  04/05/13  Yes Darlyne Russian, MD  Tamsulosin HCl (FLOMAX) 0.4 MG CAPS Take 0.4 mg by mouth at bedtime.    Yes Historical Provider, MD  diazepam (VALIUM) 5 MG tablet Take 0.5-1 tablets (2.5-5 mg total) by mouth 2 (two) times daily as needed for muscle spasms. 05/13/14   Wendie Agreste, MD  gabapentin (NEURONTIN) 100 MG capsule Take 300 mg by mouth 3 (three) times daily.    Historical Provider, MD  oxyCODONE (OXY IR/ROXICODONE) 5 MG immediate release tablet Take 5 mg by mouth at bedtime as needed for moderate pain or severe pain.    Historical Provider, MD     ROS: The patient denies fevers, chills, night sweats, unintentional weight loss, chest pain, palpitations, wheezing,  dyspnea on exertion, nausea, vomiting, abdominal pain, dysuria, hematuria, melena, numbness, , or tingling.   All other systems have been reviewed and were otherwise negative with the exception of those mentioned in the HPI and as above.    PHYSICAL EXAM: Filed Vitals:   07/04/14 1058  BP: 112/70  Pulse: 88  Temp: 98 F (36.7 C)  Resp: 16  SPo2 96% Filed Vitals:   07/04/14 1058  Height: 5\' 5"  (1.651 m)  Weight: 228 lb (103.42 kg)   Body mass index is 37.94 kg/(m^2).  General: Alert, no acute distress HEENT:  Normocephalic, atraumatic, oropharynx patent. EOMI, PERRLA Cardiovascular:  IRregularly irregular, no rubs murmurs or gallops.  No Carotid bruits, radial pulse intact. No pedal edema.  Respiratory: Clear to auscultation bilaterally.  No wheezes, rales, or rhonchi.  No cyanosis, no use of accessory musculature GI: No organomegaly, abdomen is soft and non-tender, positive bowel sounds.  No masses. Skin: No rashes. Neurologic: Facial musculature symmetric. Psychiatric: Patient is appropriate throughout our interaction. Lymphatic: No cervical lymphadenopathy Musculoskeletal: Gait intact.   LABS: Results for orders placed in visit on 06/27/14  COMPREHENSIVE METABOLIC PANEL      Result Value Ref Range   Sodium 136  135 - 145 mEq/L   Potassium 4.1  3.5 - 5.3 mEq/L   Chloride 97  96 - 112 mEq/L   CO2 31  19 - 32 mEq/L   Glucose, Bld 101 (*) 70 - 99 mg/dL   BUN 15  6 - 23 mg/dL   Creat 1.12  0.50 - 1.35 mg/dL   Total Bilirubin 0.5  0.2 - 1.2 mg/dL   Alkaline Phosphatase 120 (*) 39 - 117 U/L   AST 17  0 - 37 U/L   ALT 16  0 - 53 U/L   Total Protein 7.0  6.0 - 8.3 g/dL   Albumin 3.8  3.5 - 5.2 g/dL   Calcium 8.7  8.4 - 10.5 mg/dL  POCT CBC      Result Value Ref Range   WBC 10.3 (*) 4.6 - 10.2 K/uL   Lymph, poc 1.4  0.6 - 3.4   POC LYMPH PERCENT 13.5  10 - 50 %L   MID (cbc) 0.6  0 - 0.9   POC MID % 6.2  0 - 12 %M   POC Granulocyte 8.3 (*) 2 - 6.9   Granulocyte  percent 80.3 (*) 37 - 80 %G   RBC 4.64 (*) 4.69 - 6.13 M/uL   Hemoglobin 13.5 (*) 14.1 - 18.1 g/dL   HCT, POC 43.9  43.5 - 53.7 %   MCV 94.6  80 -  97 fL   MCH, POC 29.1  27 - 31.2 pg   MCHC 30.8 (*) 31.8 - 35.4 g/dL   RDW, POC 18.4     Platelet Count, POC 231  142 - 424 K/uL   MPV 7.1  0 - 99.8 fL     EKG/XRAY:   Primary read interpreted by Dr. Marin Comment at [facility].   ASSESSMENT/PLAN: Encounter Diagnoses  Name Primary?  . Lower respiratory infection Yes  . Obstructive sleep apnea   . Anemia, unspecified anemia type    Bronchitis resolved,s till feels he is alittle tired but doing much better after being on abx [facility]    Gross sideeffects, risk and benefits, and alternatives of medications d/w patient. Patient is aware that all medications have potential sideeffects and we are unable to predict every sideeffect or drug-drug interaction that may occur.  Gearldean Lomanto, [place], DO 07/04/2014 12:01 PM

## 2014-07-08 ENCOUNTER — Encounter: Payer: Self-pay | Admitting: Family Medicine

## 2014-07-08 DIAGNOSIS — G250 Essential tremor: Secondary | ICD-10-CM

## 2014-10-21 ENCOUNTER — Telehealth: Payer: Self-pay | Admitting: *Deleted

## 2014-10-21 NOTE — Telephone Encounter (Signed)
Spoke with pt received flu shot at the [facility].

## 2014-11-07 ENCOUNTER — Other Ambulatory Visit: Payer: Self-pay | Admitting: Emergency Medicine

## 2014-11-10 ENCOUNTER — Other Ambulatory Visit: Payer: Self-pay | Admitting: Emergency Medicine

## 2014-12-05 ENCOUNTER — Other Ambulatory Visit: Payer: Self-pay | Admitting: Family Medicine

## 2014-12-29 ENCOUNTER — Telehealth: Payer: Self-pay | Admitting: *Deleted

## 2014-12-29 MED ORDER — LOSARTAN POTASSIUM-HCTZ 50-12.5 MG PO TABS: 1.0000 | ORAL_TABLET | Freq: Every day | ORAL | Status: DC

## 2014-12-29 NOTE — Telephone Encounter (Signed)
Patient's blood pressure is high and he ran out of his Losartan at the beginning of the week.   We have schedule an appt with Dr. Leward Quan on 12/30/2014 @ 3:15.  Is there anyway to get him a couple of pills, until the appt?  We had to get his [facility] doctor fixed,since they were giving him the run around.    This has been fixed as of today.   Patient # [phone]  [place] on [address].

## 2014-12-29 NOTE — Telephone Encounter (Signed)
Rx sent into pharmacy and advise that pt must keep appt.

## 2014-12-30 ENCOUNTER — Ambulatory Visit (INDEPENDENT_AMBULATORY_CARE_PROVIDER_SITE_OTHER): Payer: Medicare Other | Admitting: Family Medicine

## 2014-12-30 ENCOUNTER — Encounter: Payer: Self-pay | Admitting: Family Medicine

## 2014-12-30 VITALS — BP 145/69 | HR 97 | Temp 98.6°F | Resp 16 | Ht 65.0 in | Wt 228.2 lb

## 2014-12-30 DIAGNOSIS — I10 Essential (primary) hypertension: Secondary | ICD-10-CM | POA: Diagnosis not present

## 2014-12-30 MED ORDER — LOSARTAN POTASSIUM-HCTZ 50-12.5 MG PO TABS: 1.0000 | ORAL_TABLET | Freq: Every day | ORAL | Status: DC

## 2014-12-30 NOTE — Progress Notes (Signed)
S:  This 69 y.o. Male has HTN, controlled on losartan- hctz 50-12.5 mg 1 tab daily. He receives medical care at the [facility] facility but this med is not on their formulary. He reports infrequent lightheadedness but otherwise feels well. He denies diaphoresis,fatigue, CP or tightness, palpitations, SOB or DOE, edema, cough, HA, dizziness, numbness, weakness or syncope.  Patient Active Problem List   Diagnosis Date Noted  . Acute cholecystitis with chronic cholecystitis s/p lap LOA/chole 12/07/2013 12/07/2013  . Obesity (BMI 30-39.9) 12/07/2013  . Refusal of blood transfusions as patient is Jehovah's Witness 12/07/2013  . Rheumatoid factor positive 10/26/2012  . Nonspecific abnormal finding in stool contents 07/28/2012  . Multiple joint pain 06/05/2012  . History of tobacco use-  06/05/2012  . Asthmatic bronchitis 12/15/2011  . BPH (benign prostatic hyperplasia) 12/15/2011  . Essential tremor 12/15/2011  . HTN (hypertension) 12/15/2011    Prior to Admission medications   Medication Sig Start Date End Date Taking? Authorizing Provider  diazepam (VALIUM) 5 MG tablet Take 0.5-1 tablets (2.5-5 mg total) by mouth 2 (two) times daily as needed for muscle spasms. 05/13/14  Yes Wendie Agreste, MD  losartan-hydrochlorothiazide (HYZAAR) 50-12.5 MG per tablet Take 1 tablet by mouth daily.   Yes Barton Fanny, MD  meloxicam (MOBIC) 15 MG tablet Take 15 mg by mouth daily.   Yes Historical Provider, MD  primidone (MYSOLINE) 50 MG tablet Take 100 mg by mouth daily.  04/05/13  Yes Darlyne Russian, MD  Tamsulosin HCl (FLOMAX) 0.4 MG CAPS Take 0.4 mg by mouth at bedtime.    Yes Historical Provider, MD  gabapentin (NEURONTIN) 100 MG capsule Take 300 mg by mouth 3 (three) times daily.    Historical Provider, MD  oxyCODONE (OXY IR/ROXICODONE) 5 MG immediate release tablet Take 5 mg by mouth at bedtime as needed for moderate pain or severe pain.    Historical Provider, MD    Past Surgical History  Procedure  Laterality Date  . Tonsillectomy    . Eye surgery      right, growth excision  . Colonoscopy    . Vasectomy    . Anterior cervical decomp/discectomy fusion N/A 03/30/2013    Procedure: ANTERIOR CERVICAL DECOMPRESSION/DISCECTOMY FUSION 2 LEVELS;  Surgeon: Otilio Connors, MD;  Location: [place] [facility];  Service: Neurosurgery;  Laterality: N/A;  C4-5 C5-6 Anterior cervical decompression/diskectomy/fusion/Allograft/Plate  . Spine surgery    . Wrist ganglion excision Left   . Cholecystectomy N/A 12/07/2013    Procedure: LAPAROSCOPIC CHOLECYSTECTOMY WITH INTRAOPERATIVE CHOLANGIOGRAM;  Surgeon: Adin Hector, MD;  Location: [facility];  Service: General;  Laterality: N/A;    History   Social History  . Marital Status: Married    Spouse Name: N/A  . Number of Children: 2  . Years of Education: N/A   Occupational History  . bus driver    Social History Main Topics  . Smoking status: Former Smoker    Types: Cigarettes    Quit date: 12/14/1972  . Smokeless tobacco: Never Used  . Alcohol Use: Yes     Comment: occasionally  . Drug Use: No  . Sexual Activity: Yes    Birth Control/ Protection: None   Other Topics Concern  . Not on file   Social History Narrative    Family History  Problem Relation Age of Onset  . Colon cancer Maternal Grandfather   . Diabetes Paternal Aunt     x 4 aunts  . Kidney disease Brother   .  Stroke Maternal Grandmother     ROS: AS per HPI.  O: Filed Vitals:   12/30/14 1501  BP: 145/69  Pulse: 97  Temp: 98.6 F (37 C)  Resp: 16   GEN: In NAD; WN,WD. HENT: [place]/AT. EOMI w/ clear conj/sclerae. Otherwise unremarkable. COR: RRR. LUNGS: Unlabored resp. SKIN; W&D. NEURO: A&O x 3; CNs intact. Nonfocal.  A/P: Essential hypertension- Continue current medication. Follow-up at the [facility] as scheduled.

## 2014-12-30 NOTE — Patient Instructions (Signed)
Hypertension Hypertension, commonly called high blood pressure, is when the force of blood pumping through your arteries is too strong. Your arteries are the blood vessels that carry blood from your heart throughout your body. A blood pressure reading consists of a higher number over a lower number, such as 110/72. The higher number (systolic) is the pressure inside your arteries when your heart pumps. The lower number (diastolic) is the pressure inside your arteries when your heart relaxes. Ideally you want your blood pressure below 120/80. Hypertension forces your heart to work harder to pump blood. Your arteries may become narrow or stiff. Having hypertension puts you at risk for heart disease, stroke, and other problems.  RISK FACTORS Some risk factors for high blood pressure are controllable. Others are not.  Risk factors you cannot control include:   Race. You may be at higher risk if you are African American.  Age. Risk increases with age.  Gender. Men are at higher risk than women before age 45 years. After age 65, women are at higher risk than men. Risk factors you can control include:  Not getting enough exercise or physical activity.  Being overweight.  Getting too much fat, sugar, calories, or salt in your diet.  Drinking too much alcohol. SIGNS AND SYMPTOMS Hypertension does not usually cause signs or symptoms. Extremely high blood pressure (hypertensive crisis) may cause headache, anxiety, shortness of breath, and nosebleed. DIAGNOSIS  To check if you have hypertension, your health care provider will measure your blood pressure while you are seated, with your arm held at the level of your heart. It should be measured at least twice using the same arm. Certain conditions can cause a difference in blood pressure between your right and left arms. A blood pressure reading that is higher than normal on one occasion does not mean that you need treatment. If one blood pressure reading  is high, ask your health care provider about having it checked again. TREATMENT  Treating high blood pressure includes making lifestyle changes and possibly taking medicine. Living a healthy lifestyle can help lower high blood pressure. You may need to change some of your habits. Lifestyle changes may include:  Following the DASH diet. This diet is high in fruits, vegetables, and whole grains. It is low in salt, red meat, and added sugars.  Getting at least 2 hours of brisk physical activity every week.  Losing weight if necessary.  Not smoking.  Limiting alcoholic beverages.  Learning ways to reduce stress. If lifestyle changes are not enough to get your blood pressure under control, your health care provider may prescribe medicine. You may need to take more than one. Work closely with your health care provider to understand the risks and benefits. HOME CARE INSTRUCTIONS  Have your blood pressure rechecked as directed by your health care provider.   Take medicines only as directed by your health care provider. Follow the directions carefully. Blood pressure medicines must be taken as prescribed. The medicine does not work as well when you skip doses. Skipping doses also puts you at risk for problems.   Do not smoke.   Monitor your blood pressure at home as directed by your health care provider. SEEK MEDICAL CARE IF:   You think you are having a reaction to medicines taken.  You have recurrent headaches or feel dizzy.  You have swelling in your ankles.  You have trouble with your vision. SEEK IMMEDIATE MEDICAL CARE IF:  You develop a severe headache or confusion.    You have unusual weakness, numbness, or feel faint.  You have severe chest or abdominal pain.  You vomit repeatedly.  You have trouble breathing. MAKE SURE YOU:   Understand these instructions.  Will watch your condition.  Will get help right away if you are not doing well or get worse. Document  Released: 10/29/2005 Document Revised: 03/15/2014 Document Reviewed: 08/21/2013 [facility] Patient Information 2015 [place], [place]. This information is not intended to replace advice given to you by your health care provider. Make sure you discuss any questions you have with your health care provider.        Mediterranean Diet  Why follow it? Research shows. . Those who follow the Mediterranean diet have a reduced risk of heart disease  . The diet is associated with a reduced incidence of Parkinson's and Alzheimer's diseases . People following the diet may have longer life expectancies and lower rates of chronic diseases  . The Dietary Guidelines for Americans recommends the Mediterranean diet as an eating plan to promote health and prevent disease  What Is the Mediterranean Diet?  . Healthy eating plan based on typical foods and recipes of Mediterranean-style cooking . The diet is primarily a plant based diet; these foods should make up a majority of meals   Starches - Plant based foods should make up a majority of meals - They are an important sources of vitamins, minerals, energy, antioxidants, and fiber - Choose whole grains, foods high in fiber and minimally processed items  - Typical grain sources include wheat, oats, barley, corn, brown rice, bulgar, farro, millet, polenta, couscous  - Various types of beans include chickpeas, lentils, fava beans, black beans, white beans   Fruits  Veggies - Large quantities of antioxidant rich fruits & veggies; 6 or more servings  - Vegetables can be eaten raw or lightly drizzled with oil and cooked  - Vegetables common to the traditional Mediterranean Diet include: artichokes, arugula, beets, broccoli, brussel sprouts, cabbage, carrots, celery, collard greens, cucumbers, eggplant, kale, leeks, lemons, lettuce, mushrooms, okra, onions, peas, peppers, potatoes, pumpkin, radishes, rutabaga, shallots, spinach, sweet potatoes, turnips, zucchini - Fruits  common to the Mediterranean Diet include: apples, apricots, avocados, cherries, clementines, dates, figs, grapefruits, grapes, melons, nectarines, oranges, peaches, pears, pomegranates, strawberries, tangerines  Fats - Replace butter and margarine with healthy oils, such as olive oil, canola oil, and tahini  - Limit nuts to no more than a handful a day  - Nuts include walnuts, almonds, pecans, pistachios, pine nuts  - Limit or avoid candied, honey roasted or heavily salted nuts - Olives are central to the [employer] - can be eaten whole or used in a variety of dishes   Meats Protein - Limiting red meat: no more than a few times a month - When eating red meat: choose lean cuts and keep the portion to the size of deck of cards - Eggs: approx. 0 to 4 times a week  - Fish and lean poultry: at least 2 a week  - Healthy protein sources include, chicken, [place], lean beef, lamb - Increase intake of seafood such as tuna, salmon, trout, mackerel, shrimp, scallops - Avoid or limit high fat processed meats such as sausage and bacon  Dairy - Include moderate amounts of low fat dairy products  - Focus on healthy dairy such as fat free yogurt, skim milk, low or reduced fat cheese - Limit dairy products higher in fat such as whole or 2% milk, cheese, ice cream  Alcohol -  Moderate amounts of red wine is ok  - No more than 5 oz daily for women (all ages) and men older than age 17  - No more than 10 oz of wine daily for men younger than 60  Other - Limit sweets and other desserts  - Use herbs and spices instead of salt to flavor foods  - Herbs and spices common to the traditional Mediterranean Diet include: basil, bay leaves, chives, cloves, cumin, fennel, garlic, lavender, marjoram, mint, oregano, parsley, pepper, rosemary, sage, savory, sumac, tarragon, thyme   It's not just a diet, it's a lifestyle:  . The Mediterranean diet includes lifestyle factors typical of those in the region  . Foods, drinks  and meals are best eaten with others and savored . Daily physical activity is important for overall good health . This could be strenuous exercise like running and aerobics . This could also be more leisurely activities such as walking, housework, yard-work, or taking the stairs . Moderation is the key; a balanced and healthy diet accommodates most foods and drinks . Consider portion sizes and frequency of consumption of certain foods   Meal Ideas & Options:  . Breakfast:  o Whole wheat toast or whole wheat English muffins with peanut butter & hard boiled egg o Steel cut oats topped with apples & cinnamon and skim milk  o Fresh fruit: banana, strawberries, melon, berries, peaches  o Smoothies: strawberries, bananas, greek yogurt, peanut butter o Low fat greek yogurt with blueberries and granola  o Egg white omelet with spinach and mushrooms o Breakfast couscous: whole wheat couscous, apricots, skim milk, cranberries  . Sandwiches:  o Hummus and grilled vegetables (peppers, zucchini, squash) on whole wheat bread   o Grilled chicken on whole wheat pita with lettuce, tomatoes, cucumbers or tzatziki  o Tuna salad on whole wheat bread: tuna salad made with greek yogurt, olives, red peppers, capers, green onions o Garlic rosemary lamb pita: lamb sauted with garlic, rosemary, salt & pepper; add lettuce, cucumber, greek yogurt to pita - flavor with lemon juice and black pepper  . Seafood:  o Mediterranean grilled salmon, seasoned with garlic, basil, parsley, lemon juice and black pepper o Shrimp, lemon, and spinach whole-grain pasta salad made with low fat greek yogurt  o Seared scallops with lemon orzo  o Seared tuna steaks seasoned salt, pepper, coriander topped with tomato mixture of olives, tomatoes, olive oil, minced garlic, parsley, green onions and cappers  . Meats:  o Herbed greek chicken salad with kalamata olives, cucumber, feta  o Red bell peppers stuffed with spinach, bulgur, lean  ground beef (or lentils) & topped with feta   o Kebabs: skewers of chicken, tomatoes, onions, zucchini, squash  o [place] burgers: made with red onions, mint, dill, lemon juice, feta cheese topped with roasted red peppers . Vegetarian o Cucumber salad: cucumbers, artichoke hearts, celery, red onion, feta cheese, tossed in olive oil & lemon juice  o Hummus and whole grain pita points with a greek salad (lettuce, tomato, feta, olives, cucumbers, red onion) o Lentil soup with celery, carrots made with vegetable broth, garlic, salt and pepper  o Tabouli salad: parsley, bulgur, mint, scallions, cucumbers, tomato, radishes, lemon juice, olive oil, salt and pepper. o

## 2015-01-03 ENCOUNTER — Ambulatory Visit (INDEPENDENT_AMBULATORY_CARE_PROVIDER_SITE_OTHER): Payer: Medicare Other | Admitting: Internal Medicine

## 2015-01-03 ENCOUNTER — Ambulatory Visit (INDEPENDENT_AMBULATORY_CARE_PROVIDER_SITE_OTHER): Payer: Medicare Other

## 2015-01-03 VITALS — BP 148/80 | HR 84 | Temp 98.0°F | Resp 18 | Ht 65.0 in | Wt 225.6 lb

## 2015-01-03 DIAGNOSIS — R208 Other disturbances of skin sensation: Secondary | ICD-10-CM

## 2015-01-03 DIAGNOSIS — R200 Anesthesia of skin: Secondary | ICD-10-CM

## 2015-01-03 DIAGNOSIS — M5136 Other intervertebral disc degeneration, lumbar region: Secondary | ICD-10-CM

## 2015-01-03 DIAGNOSIS — M5441 Lumbago with sciatica, right side: Secondary | ICD-10-CM | POA: Diagnosis not present

## 2015-01-03 DIAGNOSIS — M5442 Lumbago with sciatica, left side: Secondary | ICD-10-CM

## 2015-01-03 MED ORDER — METHOCARBAMOL 750 MG PO TABS: 750.0000 mg | ORAL_TABLET | Freq: Four times a day (QID) | ORAL | Status: DC

## 2015-01-03 NOTE — Progress Notes (Signed)
   Subjective:    Patient ID: Bryan [name], male    DOB: 16-Jan-1946, 69 y.o.   MRN: [id number]  HPI Has chronic back pain. What is worse is progressive numbness when on his feet in his legs and feet. Has past dx of spinal stenosis with Dr. Alver Sorrow orthopedist. Myelogram talked about not done. No incontinence, weakness. Numbness resolves when he sits down [place] [facility] been delivering his care of late.  Review of Systems     Objective:   Physical Exam  Constitutional: He is oriented to person, place, and time. He appears well-nourished. No distress.  HENT:  Head: Normocephalic.  Nose: Nose normal.  Eyes: Conjunctivae are normal. Pupils are equal, round, and reactive to light.  Neck: Normal range of motion.  Pulmonary/Chest: Effort normal.  Musculoskeletal:       Lumbar back: He exhibits tenderness, pain and spasm. He exhibits no bony tenderness.  Neurological: He is alert and oriented to person, place, and time. He has normal strength. No cranial nerve deficit or sensory deficit. He exhibits normal muscle tone. Coordination and gait normal.  Reflex Scores:      Patellar reflexes are 2+ on the right side and 2+ on the left side.      Achilles reflexes are 1+ on the right side and 1+ on the left side.  [facility] reading (PRIMARY) by  Dr Elder Cyphers severe DDD, osteoarthritis LS spine with compressions         Assessment & Plan:  LBP/Peresthesias feet comes and goes/Possible spinal stenosis Refer to orthopedist Tylenol/Use gabapentin and oxycodone prn you have from [facility] Stay away from NSAIDS

## 2015-01-11 DIAGNOSIS — M5440 Lumbago with sciatica, unspecified side: Secondary | ICD-10-CM | POA: Diagnosis not present

## 2015-01-20 DIAGNOSIS — M5440 Lumbago with sciatica, unspecified side: Secondary | ICD-10-CM | POA: Diagnosis not present

## 2015-01-28 DIAGNOSIS — M5440 Lumbago with sciatica, unspecified side: Secondary | ICD-10-CM | POA: Diagnosis not present

## 2015-02-01 ENCOUNTER — Other Ambulatory Visit: Payer: Self-pay | Admitting: Surgical

## 2015-02-01 DIAGNOSIS — M48061 Spinal stenosis, lumbar region without neurogenic claudication: Secondary | ICD-10-CM

## 2015-02-03 NOTE — Patient Instructions (Addendum)
Bryan [name]  02/03/2015   Your procedure is scheduled on:  02/15/2015    Report to [facility] Main  Entrance and follow signs to               [place] at      1040 AM.  Call this number if you have problems the morning of surgery [phone]   Remember:  Do not eat food or drink liquids :After Midnight.     Take these medicines the morning of surgery with A SIP OF WATER: Primidone ( Mysoline )                                 You may not have any metal on your body including hair pins and              piercings  Do not wear jewelry, , lotions, powders or perfumes., deodorant.                         Men may shave face and neck.   Do not bring valuables to the hospital. [place].  Contacts, dentures or bridgework may not be worn into surgery.  Leave suitcase in the car. After surgery it may be brought to your room.     Patients discharged the day of surgery will not be allowed to drive home.  Name and phone number of your driver:  Special Instructions: N/A              Please read over the following fact sheets you were given: _____________________________________________________________________             [facility] - Preparing for Surgery Before surgery, you can play an important role.  Because skin is not sterile, your skin needs to be as free of germs as possible.  You can reduce the number of germs on your skin by washing with CHG (chlorahexidine gluconate) soap before surgery.  CHG is an antiseptic cleaner which kills germs and bonds with the skin to continue killing germs even after washing. Please DO NOT use if you have an allergy to CHG or antibacterial soaps.  If your skin becomes reddened/irritated stop using the CHG and inform your nurse when you arrive at Short Stay. Do not shave (including legs and underarms) for at least 48 hours prior to the first CHG shower.  You may shave your  face/neck. Please follow these instructions carefully:  1.  Shower with CHG Soap the night before surgery and the  morning of Surgery.  2.  If you choose to wash your hair, wash your hair first as usual with your  normal  shampoo.  3.  After you shampoo, rinse your hair and body thoroughly to remove the  shampoo.                           4.  Use CHG as you would any other liquid soap.  You can apply chg directly  to the skin and wash                       Gently with a scrungie  or clean washcloth.  5.  Apply the CHG Soap to your body ONLY FROM THE NECK DOWN.   Do not use on face/ open                           Wound or open sores. Avoid contact with eyes, ears mouth and genitals (private parts).                       Wash face,  Genitals (private parts) with your normal soap.             6.  Wash thoroughly, paying special attention to the area where your surgery  will be performed.  7.  Thoroughly rinse your body with warm water from the neck down.  8.  DO NOT shower/wash with your normal soap after using and rinsing off  the CHG Soap.                9.  Pat yourself dry with a clean towel.            10.  Wear clean pajamas.            11.  Place clean sheets on your bed the night of your first shower and do not  sleep with pets. Day of Surgery : Do not apply any lotions/deodorants the morning of surgery.  Please wear clean clothes to the hospital/surgery center.  FAILURE TO FOLLOW THESE INSTRUCTIONS MAY RESULT IN THE CANCELLATION OF YOUR SURGERY PATIENT SIGNATURE_________________________________  NURSE SIGNATURE__________________________________  ________________________________________________________________________   Bryan [name]  An incentive spirometer is a tool that can help keep your lungs clear and active. This tool measures how well you are filling your lungs with each breath. Taking long deep breaths may help reverse or decrease the chance of developing breathing  (pulmonary) problems (especially infection) following:  A long period of time when you are unable to move or be active. BEFORE THE PROCEDURE   If the spirometer includes an indicator to show your best effort, your nurse or respiratory therapist will set it to a desired goal.  If possible, sit up straight or lean slightly forward. Try not to slouch.  Hold the incentive spirometer in an upright position. INSTRUCTIONS FOR USE  1. Sit on the edge of your bed if possible, or sit up as far as you can in bed or on a chair. 2. Hold the incentive spirometer in an upright position. 3. Breathe out normally. 4. Place the mouthpiece in your mouth and seal your lips tightly around it. 5. Breathe in slowly and as deeply as possible, raising the piston or the ball toward the top of the column. 6. Hold your breath for 3-5 seconds or for as long as possible. Allow the piston or ball to fall to the bottom of the column. 7. Remove the mouthpiece from your mouth and breathe out normally. 8. Rest for a few seconds and repeat Steps 1 through 7 at least 10 times every 1-2 hours when you are awake. Take your time and take a few normal breaths between deep breaths. 9. The spirometer may include an indicator to show your best effort. Use the indicator as a goal to work toward during each repetition. 10. After each set of 10 deep breaths, practice coughing to be sure your lungs are clear. If you have an incision (the cut made at the time of surgery), support your incision when  coughing by placing a pillow or rolled up towels firmly against it. Once you are able to get out of bed, walk around indoors and cough well. You may stop using the incentive spirometer when instructed by your caregiver.  RISKS AND COMPLICATIONS  Take your time so you do not get dizzy or light-headed.  If you are in pain, you may need to take or ask for pain medication before doing incentive spirometry. It is harder to take a deep breath if you  are having pain. AFTER USE  Rest and breathe slowly and easily.  It can be helpful to keep track of a log of your progress. Your caregiver can provide you with a simple table to help with this. If you are using the spirometer at home, follow these instructions: [place] IF:   You are having difficultly using the spirometer.  You have trouble using the spirometer as often as instructed.  Your pain medication is not giving enough relief while using the spirometer.  You develop fever of 100.5 F (38.1 C) or higher. SEEK IMMEDIATE MEDICAL CARE IF:   You cough up bloody sputum that had not been present before.  You develop fever of 102 F (38.9 C) or greater.  You develop worsening pain at or near the incision site. MAKE SURE YOU:   Understand these instructions.  Will watch your condition.  Will get help right away if you are not doing well or get worse. Document Released: 03/11/2007 Document Revised: 01/21/2012 Document Reviewed: 05/12/2007 [facility] Patient Information 2014 [place], [place].   ________________________________________________________________________

## 2015-02-08 ENCOUNTER — Encounter (HOSPITAL_COMMUNITY)
Admission: RE | Admit: 2015-02-08 | Discharge: 2015-02-08 | Disposition: A | Discharge status: Home / Self Care | Payer: Medicare Other | Source: Ambulatory Visit | Attending: Orthopedic Surgery | Admitting: Orthopedic Surgery

## 2015-02-08 ENCOUNTER — Ambulatory Visit (HOSPITAL_COMMUNITY)
Admission: RE | Admit: 2015-02-08 | Discharge: 2015-02-08 | Disposition: A | Discharge status: Home / Self Care | Payer: Medicare Other | Source: Ambulatory Visit | Attending: Surgical | Admitting: Surgical

## 2015-02-08 ENCOUNTER — Encounter (HOSPITAL_COMMUNITY): Payer: Self-pay | Admitting: *Deleted

## 2015-02-08 DIAGNOSIS — Z87891 Personal history of nicotine dependence: Secondary | ICD-10-CM | POA: Diagnosis not present

## 2015-02-08 DIAGNOSIS — Z01818 Encounter for other preprocedural examination: Secondary | ICD-10-CM | POA: Insufficient documentation

## 2015-02-08 DIAGNOSIS — F17210 Nicotine dependence, cigarettes, uncomplicated: Secondary | ICD-10-CM | POA: Diagnosis not present

## 2015-02-08 LAB — CBC WITH DIFFERENTIAL/PLATELET
Basophils Absolute: 0 10*3/uL (ref 0.0–0.1)
Basophils Relative: 0 % (ref 0–1)
Eosinophils Absolute: 0.2 10*3/uL (ref 0.0–0.7)
Eosinophils Relative: 3 % (ref 0–5)
HCT: 38.2 % — ABNORMAL LOW (ref 39.0–52.0)
Hemoglobin: 12.3 g/dL — ABNORMAL LOW (ref 13.0–17.0)
Lymphocytes Relative: 25 % (ref 12–46)
Lymphs Abs: 1.6 10*3/uL (ref 0.7–4.0)
MCH: 29.3 pg (ref 26.0–34.0)
MCHC: 32.2 g/dL (ref 30.0–36.0)
MCV: 91 fL (ref 78.0–100.0)
Monocytes Absolute: 0.5 10*3/uL (ref 0.1–1.0)
Monocytes Relative: 7 % (ref 3–12)
Neutro Abs: 4 10*3/uL (ref 1.7–7.7)
Neutrophils Relative %: 65 % (ref 43–77)
Platelets: 238 10*3/uL (ref 150–400)
RBC: 4.2 MIL/uL — ABNORMAL LOW (ref 4.22–5.81)
RDW: 14.5 % (ref 11.5–15.5)
WBC: 6.2 10*3/uL (ref 4.0–10.5)

## 2015-02-08 LAB — URINALYSIS, ROUTINE W REFLEX MICROSCOPIC
Bilirubin Urine: NEGATIVE
Glucose, UA: NEGATIVE mg/dL
Hgb urine dipstick: NEGATIVE
Ketones, ur: NEGATIVE mg/dL
Leukocytes, UA: NEGATIVE
Nitrite: NEGATIVE
Protein, ur: NEGATIVE mg/dL
Specific Gravity, Urine: 1.023 (ref 1.005–1.030)
Urobilinogen, UA: 0.2 mg/dL (ref 0.0–1.0)
pH: 5.5 (ref 5.0–8.0)

## 2015-02-08 LAB — COMPREHENSIVE METABOLIC PANEL
ALT: 37 U/L (ref 0–53)
AST: 35 U/L (ref 0–37)
Albumin: 3.9 g/dL (ref 3.5–5.2)
Alkaline Phosphatase: 130 U/L — ABNORMAL HIGH (ref 39–117)
Anion gap: 5 (ref 5–15)
BUN: 19 mg/dL (ref 6–23)
CO2: 30 mmol/L (ref 19–32)
Calcium: 9.2 mg/dL (ref 8.4–10.5)
Chloride: 105 mmol/L (ref 96–112)
Creatinine, Ser: 0.93 mg/dL (ref 0.50–1.35)
GFR calc Af Amer: >90 mL/min (ref >90)
GFR calc non Af Amer: 83 mL/min — ABNORMAL LOW (ref >90)
Glucose, Bld: 95 mg/dL (ref 70–99)
Potassium: 3.8 mmol/L (ref 3.5–5.1)
Sodium: 140 mmol/L (ref 135–145)
Total Bilirubin: 0.4 mg/dL (ref 0.3–1.2)
Total Protein: 7.6 g/dL (ref 6.0–8.3)

## 2015-02-08 LAB — SURGICAL PCR SCREEN
MRSA, PCR: NEGATIVE
STAPHYLOCOCCUS AUREUS: NEGATIVE

## 2015-02-08 LAB — NO BLOOD PRODUCTS

## 2015-02-08 LAB — APTT: aPTT: 30 seconds (ref 24–37)

## 2015-02-08 LAB — PROTIME-INR
INR: 1.02 (ref 0.00–1.49)
Prothrombin Time: 13.5 seconds (ref 11.6–15.2)

## 2015-02-08 NOTE — Progress Notes (Signed)
Patient has signed Consent form for BLOOD PRODUCT REFUSAL.  Health Care Power of Attorney on chart.

## 2015-02-08 NOTE — Progress Notes (Signed)
EKG- 06/17/2014 in [facility]  Sleep Study 02/18/2013 EPIC

## 2015-02-08 NOTE — Progress Notes (Signed)
Blood Product Refusal Consent faxed to [facility] with confirmation received and placed on chart.   Progress note regarding Blood Product Refusal faxed via EPIC to Dr Gladstone Lighter.  Info placed in Allergies and in FYI.

## 2015-02-10 NOTE — Progress Notes (Signed)
Final EKG done 02/08/2015 in EPIC.

## 2015-02-15 ENCOUNTER — Encounter (HOSPITAL_COMMUNITY)
Admission: RE | Disposition: A | Discharge status: Home / Self Care | Payer: Self-pay | Source: Ambulatory Visit | Attending: Orthopedic Surgery

## 2015-02-15 ENCOUNTER — Encounter (HOSPITAL_COMMUNITY): Payer: Self-pay | Admitting: *Deleted

## 2015-02-15 ENCOUNTER — Inpatient Hospital Stay (HOSPITAL_COMMUNITY): Payer: Medicare Other

## 2015-02-15 ENCOUNTER — Observation Stay (HOSPITAL_COMMUNITY)
Admission: RE | Admit: 2015-02-15 | Discharge: 2015-02-17 | Disposition: A | Discharge status: Home / Self Care | Payer: Medicare Other | Source: Ambulatory Visit | Attending: Orthopedic Surgery | Admitting: Orthopedic Surgery

## 2015-02-15 ENCOUNTER — Inpatient Hospital Stay (HOSPITAL_COMMUNITY): Payer: Medicare Other | Admitting: Anesthesiology

## 2015-02-15 DIAGNOSIS — M4806 Spinal stenosis, lumbar region: Principal | ICD-10-CM | POA: Insufficient documentation

## 2015-02-15 DIAGNOSIS — M1389 Other specified arthritis, multiple sites: Secondary | ICD-10-CM | POA: Diagnosis not present

## 2015-02-15 DIAGNOSIS — Z6836 Body mass index (BMI) 36.0-36.9, adult: Secondary | ICD-10-CM | POA: Insufficient documentation

## 2015-02-15 DIAGNOSIS — M1990 Unspecified osteoarthritis, unspecified site: Secondary | ICD-10-CM | POA: Diagnosis not present

## 2015-02-15 DIAGNOSIS — Z7982 Long term (current) use of aspirin: Secondary | ICD-10-CM | POA: Diagnosis not present

## 2015-02-15 DIAGNOSIS — I10 Essential (primary) hypertension: Secondary | ICD-10-CM | POA: Insufficient documentation

## 2015-02-15 DIAGNOSIS — G250 Essential tremor: Secondary | ICD-10-CM | POA: Diagnosis not present

## 2015-02-15 DIAGNOSIS — K219 Gastro-esophageal reflux disease without esophagitis: Secondary | ICD-10-CM | POA: Insufficient documentation

## 2015-02-15 DIAGNOSIS — M48062 Spinal stenosis, lumbar region with neurogenic claudication: Secondary | ICD-10-CM | POA: Diagnosis present

## 2015-02-15 DIAGNOSIS — N400 Enlarged prostate without lower urinary tract symptoms: Secondary | ICD-10-CM | POA: Insufficient documentation

## 2015-02-15 DIAGNOSIS — E669 Obesity, unspecified: Secondary | ICD-10-CM | POA: Insufficient documentation

## 2015-02-15 DIAGNOSIS — Z9889 Other specified postprocedural states: Secondary | ICD-10-CM | POA: Diagnosis not present

## 2015-02-15 DIAGNOSIS — Z87891 Personal history of nicotine dependence: Secondary | ICD-10-CM | POA: Insufficient documentation

## 2015-02-15 DIAGNOSIS — Z419 Encounter for procedure for purposes other than remedying health state, unspecified: Secondary | ICD-10-CM

## 2015-02-15 HISTORY — PX: DECOMPRESSIVE LUMBAR LAMINECTOMY LEVEL 2: SHX5792

## 2015-02-15 SURGERY — DECOMPRESSIVE LUMBAR LAMINECTOMY LEVEL 2
Anesthesia: General | Site: Back

## 2015-02-15 MED ORDER — BUPIVACAINE-EPINEPHRINE (PF) 0.5% -1:200000 IJ SOLN
INTRAMUSCULAR | Status: DC | PRN
  Administered 2015-02-15: 20 mL

## 2015-02-15 MED ORDER — ONDANSETRON HCL 4 MG/2ML IJ SOLN
INTRAMUSCULAR | Status: DC | PRN
Start: 2015-02-15 — End: 2015-02-15
  Administered 2015-02-15: 4 mg via INTRAVENOUS

## 2015-02-15 MED ORDER — SODIUM CHLORIDE 0.9 % IR SOLN
Status: DC | PRN
  Administered 2015-02-15: 500 mL

## 2015-02-15 MED ORDER — EPHEDRINE SULFATE 50 MG/ML IJ SOLN
INTRAMUSCULAR | Status: AC
  Filled 2015-02-15: qty 1

## 2015-02-15 MED ORDER — NEOSTIGMINE METHYLSULFATE 10 MG/10ML IV SOLN
INTRAVENOUS | Status: AC
  Filled 2015-02-15: qty 1

## 2015-02-15 MED ORDER — CEFAZOLIN SODIUM-DEXTROSE 2-3 GM-% IV SOLR
2.0000 g | INTRAVENOUS | Status: AC
  Administered 2015-02-15: 2 g via INTRAVENOUS

## 2015-02-15 MED ORDER — LOSARTAN POTASSIUM 50 MG PO TABS
50.0000 mg | ORAL_TABLET | Freq: Every day | ORAL | Status: DC
  Administered 2015-02-16 – 2015-02-17 (×2): 50 mg via ORAL
  Filled 2015-02-15 (×3): qty 1

## 2015-02-15 MED ORDER — METHOCARBAMOL 500 MG PO TABS
500.0000 mg | ORAL_TABLET | Freq: Four times a day (QID) | ORAL | Status: DC | PRN
  Administered 2015-02-16 – 2015-02-17 (×2): 500 mg via ORAL
  Filled 2015-02-15 (×2): qty 1

## 2015-02-15 MED ORDER — MIDAZOLAM HCL 2 MG/2ML IJ SOLN
INTRAMUSCULAR | Status: AC
  Filled 2015-02-15: qty 2

## 2015-02-15 MED ORDER — MENTHOL 3 MG MT LOZG: 1.0000 | LOZENGE | OROMUCOSAL | Status: DC | PRN

## 2015-02-15 MED ORDER — NEOSTIGMINE METHYLSULFATE 10 MG/10ML IV SOLN
INTRAVENOUS | Status: DC | PRN
  Administered 2015-02-15: 4 mg via INTRAVENOUS

## 2015-02-15 MED ORDER — THROMBIN 5000 UNITS EX SOLR
CUTANEOUS | Status: AC
  Filled 2015-02-15: qty 10000

## 2015-02-15 MED ORDER — LOSARTAN POTASSIUM-HCTZ 50-12.5 MG PO TABS: 1.0000 | ORAL_TABLET | Freq: Every day | ORAL | Status: DC

## 2015-02-15 MED ORDER — PROPOFOL 10 MG/ML IV BOLUS
INTRAVENOUS | Status: DC | PRN
  Administered 2015-02-15: 150 mg via INTRAVENOUS

## 2015-02-15 MED ORDER — LIDOCAINE HCL (CARDIAC) 20 MG/ML IV SOLN
INTRAVENOUS | Status: DC | PRN
  Administered 2015-02-15: 90 mg via INTRAVENOUS

## 2015-02-15 MED ORDER — LACTATED RINGERS IV SOLN
INTRAVENOUS | Status: DC
  Administered 2015-02-15: 14:00:00 via INTRAVENOUS
  Administered 2015-02-15: 1000 mL via INTRAVENOUS

## 2015-02-15 MED ORDER — PRIMIDONE 50 MG PO TABS
50.0000 mg | ORAL_TABLET | Freq: Two times a day (BID) | ORAL | Status: DC
  Administered 2015-02-15 – 2015-02-17 (×4): 50 mg via ORAL
  Filled 2015-02-15 (×5): qty 1

## 2015-02-15 MED ORDER — ONDANSETRON HCL 4 MG/2ML IJ SOLN
INTRAMUSCULAR | Status: AC
  Filled 2015-02-15: qty 2

## 2015-02-15 MED ORDER — HYDROMORPHONE HCL 1 MG/ML IJ SOLN
0.2500 mg | INTRAMUSCULAR | Status: DC | PRN
  Administered 2015-02-15: 0.5 mg via INTRAVENOUS

## 2015-02-15 MED ORDER — CEFAZOLIN SODIUM-DEXTROSE 2-3 GM-% IV SOLR
INTRAVENOUS | Status: AC
  Filled 2015-02-15: qty 50

## 2015-02-15 MED ORDER — NORTRIPTYLINE HCL 25 MG PO CAPS
25.0000 mg | ORAL_CAPSULE | Freq: Every day | ORAL | Status: DC
  Administered 2015-02-15 – 2015-02-16 (×2): 25 mg via ORAL
  Filled 2015-02-15 (×3): qty 1

## 2015-02-15 MED ORDER — CEFAZOLIN SODIUM 1-5 GM-% IV SOLN
1.0000 g | Freq: Three times a day (TID) | INTRAVENOUS | Status: AC
  Administered 2015-02-15 – 2015-02-16 (×3): 1 g via INTRAVENOUS
  Filled 2015-02-15 (×3): qty 50

## 2015-02-15 MED ORDER — GLYCOPYRROLATE 0.2 MG/ML IJ SOLN
INTRAMUSCULAR | Status: AC
  Filled 2015-02-15: qty 3

## 2015-02-15 MED ORDER — FLEET ENEMA 7-19 GM/118ML RE ENEM: 1.0000 | ENEMA | Freq: Once | RECTAL | Status: AC | PRN

## 2015-02-15 MED ORDER — FENTANYL CITRATE 0.05 MG/ML IJ SOLN
INTRAMUSCULAR | Status: AC
  Filled 2015-02-15: qty 5

## 2015-02-15 MED ORDER — ONDANSETRON HCL 4 MG/2ML IJ SOLN: 4.0000 mg | INTRAMUSCULAR | Status: DC | PRN

## 2015-02-15 MED ORDER — CISATRACURIUM BESYLATE 20 MG/10ML IV SOLN
INTRAVENOUS | Status: AC
  Filled 2015-02-15: qty 10

## 2015-02-15 MED ORDER — BISACODYL 5 MG PO TBEC: 5.0000 mg | DELAYED_RELEASE_TABLET | Freq: Every day | ORAL | Status: DC | PRN

## 2015-02-15 MED ORDER — EPHEDRINE SULFATE 50 MG/ML IJ SOLN
INTRAMUSCULAR | Status: DC | PRN
  Administered 2015-02-15: 10 mg via INTRAVENOUS

## 2015-02-15 MED ORDER — METHOCARBAMOL 1000 MG/10ML IJ SOLN
500.0000 mg | Freq: Four times a day (QID) | INTRAVENOUS | Status: DC | PRN
  Administered 2015-02-15: 500 mg via INTRAVENOUS
  Filled 2015-02-15 (×2): qty 5

## 2015-02-15 MED ORDER — CISATRACURIUM BESYLATE (PF) 10 MG/5ML IV SOLN
INTRAVENOUS | Status: DC | PRN
  Administered 2015-02-15: 10 mg via INTRAVENOUS

## 2015-02-15 MED ORDER — HYDROCHLOROTHIAZIDE 12.5 MG PO CAPS
12.5000 mg | ORAL_CAPSULE | Freq: Every day | ORAL | Status: DC
  Administered 2015-02-16 – 2015-02-17 (×2): 12.5 mg via ORAL
  Filled 2015-02-15 (×3): qty 1

## 2015-02-15 MED ORDER — HYDROCODONE-ACETAMINOPHEN 5-325 MG PO TABS
1.0000 | ORAL_TABLET | ORAL | Status: DC | PRN
  Administered 2015-02-15 – 2015-02-17 (×6): 2 via ORAL
  Filled 2015-02-15 (×6): qty 2

## 2015-02-15 MED ORDER — MIDAZOLAM HCL 5 MG/5ML IJ SOLN
INTRAMUSCULAR | Status: DC | PRN
  Administered 2015-02-15: 2 mg via INTRAVENOUS

## 2015-02-15 MED ORDER — BUPIVACAINE-EPINEPHRINE (PF) 0.5% -1:200000 IJ SOLN
INTRAMUSCULAR | Status: AC
  Filled 2015-02-15: qty 30

## 2015-02-15 MED ORDER — ACETAMINOPHEN 325 MG PO TABS: 650.0000 mg | ORAL_TABLET | ORAL | Status: DC | PRN

## 2015-02-15 MED ORDER — BUPIVACAINE LIPOSOME 1.3 % IJ SUSP
20.0000 mL | Freq: Once | INTRAMUSCULAR | Status: AC
  Administered 2015-02-15: 20 mL
  Filled 2015-02-15: qty 20

## 2015-02-15 MED ORDER — SODIUM CHLORIDE 0.9 % IR SOLN
Status: AC
  Filled 2015-02-15: qty 1

## 2015-02-15 MED ORDER — HYDROMORPHONE HCL 1 MG/ML IJ SOLN: 0.5000 mg | INTRAMUSCULAR | Status: DC | PRN

## 2015-02-15 MED ORDER — THROMBIN 5000 UNITS EX SOLR
CUTANEOUS | Status: DC | PRN
  Administered 2015-02-15: 10000 [IU] via TOPICAL

## 2015-02-15 MED ORDER — LIDOCAINE HCL (CARDIAC) 20 MG/ML IV SOLN
INTRAVENOUS | Status: AC
  Filled 2015-02-15: qty 5

## 2015-02-15 MED ORDER — HYDROMORPHONE HCL 1 MG/ML IJ SOLN
INTRAMUSCULAR | Status: AC
Start: 2015-02-15 — End: 2015-02-15
  Administered 2015-02-15: 16:00:00
  Filled 2015-02-15: qty 1

## 2015-02-15 MED ORDER — BACITRACIN-NEOMYCIN-POLYMYXIN 400-5-5000 EX OINT
TOPICAL_OINTMENT | CUTANEOUS | Status: AC
  Filled 2015-02-15: qty 1

## 2015-02-15 MED ORDER — LACTATED RINGERS IV SOLN
INTRAVENOUS | Status: DC
  Administered 2015-02-15: 18:00:00 via INTRAVENOUS

## 2015-02-15 MED ORDER — PROPOFOL 10 MG/ML IV BOLUS
INTRAVENOUS | Status: AC
  Filled 2015-02-15: qty 20

## 2015-02-15 MED ORDER — OXYCODONE-ACETAMINOPHEN 5-325 MG PO TABS: 1.0000 | ORAL_TABLET | ORAL | Status: DC | PRN

## 2015-02-15 MED ORDER — TAMSULOSIN HCL 0.4 MG PO CAPS
0.4000 mg | ORAL_CAPSULE | Freq: Every day | ORAL | Status: DC
  Administered 2015-02-15 – 2015-02-16 (×2): 0.4 mg via ORAL
  Filled 2015-02-15 (×3): qty 1

## 2015-02-15 MED ORDER — POLYETHYLENE GLYCOL 3350 17 G PO PACK: 17.0000 g | PACK | Freq: Every day | ORAL | Status: DC | PRN

## 2015-02-15 MED ORDER — ACETAMINOPHEN 650 MG RE SUPP: 650.0000 mg | RECTAL | Status: DC | PRN

## 2015-02-15 MED ORDER — PHENOL 1.4 % MT LIQD: 1.0000 | OROMUCOSAL | Status: DC | PRN

## 2015-02-15 MED ORDER — SODIUM CHLORIDE 0.9 % IJ SOLN
INTRAMUSCULAR | Status: AC
  Filled 2015-02-15: qty 10

## 2015-02-15 MED ORDER — FENTANYL CITRATE 0.05 MG/ML IJ SOLN
INTRAMUSCULAR | Status: DC | PRN
  Administered 2015-02-15: 100 ug via INTRAVENOUS
  Administered 2015-02-15: 25 ug via INTRAVENOUS
  Administered 2015-02-15: 50 ug via INTRAVENOUS
  Administered 2015-02-15 (×3): 25 ug via INTRAVENOUS

## 2015-02-15 MED ORDER — GLYCOPYRROLATE 0.2 MG/ML IJ SOLN
INTRAMUSCULAR | Status: DC | PRN
  Administered 2015-02-15: 0.6 mg via INTRAVENOUS

## 2015-02-15 SURGICAL SUPPLY — 46 items
APL SKNCLS STERI-STRIP NONHPOA (GAUZE/BANDAGES/DRESSINGS) ×1
BAG SPEC THK2 15X12 ZIP CLS (MISCELLANEOUS) ×1
BAG ZIPLOCK 12X15 (MISCELLANEOUS) ×2 IMPLANT
BENZOIN TINCTURE PRP APPL 2/3 (GAUZE/BANDAGES/DRESSINGS) ×2 IMPLANT
CLEANER TIP ELECTROSURG 2X2 (MISCELLANEOUS) ×2 IMPLANT
DRAIN PENROSE 18X1/4 LTX STRL (WOUND CARE) IMPLANT
DRAPE MICROSCOPE LEICA (MISCELLANEOUS) ×2 IMPLANT
DRAPE POUCH INSTRU U-SHP 10X18 (DRAPES) ×2 IMPLANT
DRAPE SHEET LG 3/4 BI-LAMINATE (DRAPES) ×2 IMPLANT
DRAPE SURG 17X11 SM STRL (DRAPES) ×2 IMPLANT
DRSG ADAPTIC 3X8 NADH LF (GAUZE/BANDAGES/DRESSINGS) ×2 IMPLANT
DRSG PAD ABDOMINAL 8X10 ST (GAUZE/BANDAGES/DRESSINGS) ×6 IMPLANT
DURAPREP 26ML APPLICATOR (WOUND CARE) ×2 IMPLANT
ELECT BLADE TIP CTD 4 INCH (ELECTRODE) ×2 IMPLANT
ELECT REM PT RETURN 9FT ADLT (ELECTROSURGICAL) ×2
ELECTRODE REM PT RTRN 9FT ADLT (ELECTROSURGICAL) ×1 IMPLANT
FLOSEAL 10ML (HEMOSTASIS) IMPLANT
GLOVE BIOGEL PI IND STRL 8 (GLOVE) ×1 IMPLANT
GLOVE BIOGEL PI INDICATOR 8 (GLOVE) ×1
GLOVE ECLIPSE 8.0 STRL XLNG CF (GLOVE) ×6 IMPLANT
GOWN STRL REUS W/TWL XL LVL3 (GOWN DISPOSABLE) ×4 IMPLANT
KIT BASIN OR (CUSTOM PROCEDURE TRAY) ×2 IMPLANT
KIT POSITIONING SURG ANDREWS (MISCELLANEOUS) ×2 IMPLANT
MANIFOLD NEPTUNE II (INSTRUMENTS) ×2 IMPLANT
NEEDLE HYPO 22GX1.5 SAFETY (NEEDLE) ×2 IMPLANT
NEEDLE SPNL 18GX3.5 QUINCKE PK (NEEDLE) ×4 IMPLANT
NS IRRIG 1000ML POUR BTL (IV SOLUTION) IMPLANT
PACK LAMINECTOMY ORTHO (CUSTOM PROCEDURE TRAY) ×2 IMPLANT
PAD ABD 8X10 STRL (GAUZE/BANDAGES/DRESSINGS) ×8 IMPLANT
PATTIES SURGICAL .5 X.5 (GAUZE/BANDAGES/DRESSINGS) ×2 IMPLANT
PATTIES SURGICAL .75X.75 (GAUZE/BANDAGES/DRESSINGS) IMPLANT
PATTIES SURGICAL 1X1 (DISPOSABLE) IMPLANT
PIN SAFETY NICK PLATE  2 MED (MISCELLANEOUS)
PIN SAFETY NICK PLATE 2 MED (MISCELLANEOUS) IMPLANT
POSITIONER SURGICAL ARM (MISCELLANEOUS) IMPLANT
SPONGE LAP 4X18 X RAY DECT (DISPOSABLE) ×14 IMPLANT
SPONGE SURGIFOAM ABS GEL 100 (HEMOSTASIS) ×2 IMPLANT
STAPLER VISISTAT 35W (STAPLE) ×2 IMPLANT
SUT VIC AB 0 CT1 27 (SUTURE) ×2
SUT VIC AB 0 CT1 27XBRD ANTBC (SUTURE) ×1 IMPLANT
SUT VIC AB 1 CT1 27 (SUTURE) ×3
SUT VIC AB 1 CT1 27XBRD ANTBC (SUTURE) ×3 IMPLANT
SYR 20CC LL (SYRINGE) ×4 IMPLANT
TOWEL OR 17X26 10 PK STRL BLUE (TOWEL DISPOSABLE) ×2 IMPLANT
TOWEL OR NON WOVEN STRL DISP B (DISPOSABLE) IMPLANT
TRAY FOLEY CATH 14FRSI W/METER (CATHETERS) IMPLANT

## 2015-02-15 NOTE — Plan of Care (Signed)
Problem: Phase I Progression Outcomes Goal: Initial discharge plan identified Outcome: Completed/Met Date Met:  02/15/15 Pt plans to go home with spouse.

## 2015-02-15 NOTE — Progress Notes (Signed)
RT placed patient on CPAP. Oxygen bleed into tubing. Home setting is 9 cmH20.sterile water added to water chamber for humidification. Patient is tolerating well. RT will continue to monitor as needed.

## 2015-02-15 NOTE — Interval H&P Note (Signed)
History and Physical Interval Note:  02/15/2015 12:32 PM  Bryan [name]  has presented today for surgery, with the diagnosis of SPINAL STENOSIS  The various methods of treatment have been discussed with the patient and family. After consideration of risks, benefits and other options for treatment, the patient has consented to  Procedure(s): COMPLETE DECOMPRESSIVE LUMBAR LAMINECTOMY L4-L5/POSSIBLE L3-L4 (N/A) as a surgical intervention .  The patient's history has been reviewed, patient examined, no change in status, stable for surgery.  I have reviewed the patient's chart and labs.  Questions were answered to the patient's satisfaction.     Bryan [name] A

## 2015-02-15 NOTE — Anesthesia Preprocedure Evaluation (Addendum)
Anesthesia Evaluation  Patient identified by MRN, date of birth, ID band Patient awake    Reviewed: Allergy & Precautions, H&P , NPO status , Patient's Chart, lab work & pertinent test results  Airway Mallampati: III  TM Distance: >3 FB Neck ROM: Full    Dental no notable dental hx. (+) Teeth Intact, Dental Advisory Given   Pulmonary asthma , sleep apnea and Continuous Positive Airway Pressure Ventilation , former smoker,  breath sounds clear to auscultation  Pulmonary exam normal       Cardiovascular hypertension, Pt. on medications Rhythm:Regular Rate:Normal     Neuro/Psych Seizures -,  negative psych ROS   GI/Hepatic Neg liver ROS, GERD-  Controlled,  Endo/Other  Morbid obesity  Renal/GU negative Renal ROS  negative genitourinary   Musculoskeletal  (+) Arthritis -, Osteoarthritis,    Abdominal   Peds  Hematology negative hematology ROS (+)   Anesthesia Other Findings   Reproductive/Obstetrics negative OB ROS                            Anesthesia Physical Anesthesia Plan  ASA: III  Anesthesia Plan: General   Post-op Pain Management:    Induction: Intravenous  Airway Management Planned: Oral ETT  Additional Equipment:   Intra-op Plan:   Post-operative Plan: Extubation in OR  Informed Consent: I have reviewed the patients History and Physical, chart, labs and discussed the procedure including the risks, benefits and alternatives for the proposed anesthesia with the patient or authorized representative who has indicated his/her understanding and acceptance.   Dental advisory given  Plan Discussed with: CRNA  Anesthesia Plan Comments:         Anesthesia Quick Evaluation

## 2015-02-15 NOTE — Brief Op Note (Signed)
02/15/2015  3:24 PM  PATIENT:  Bryan [name]  69 y.o. male  PRE-OPERATIVE DIAGNOSIS:  SPINAL STENOSIS at L-4-L-5 and Bilateral Foraminal Stenosis At L-4 and L-5 nerve roots.Obesity.  POST-OPERATIVE DIAGNOSIS:  Same as Pre-Op  PROCEDURE:  Procedure(s): COMPLETE DECOMPRESSIVE LUMBAR LAMINECTOMY L4-L5/ FORAMINOTOMY TO L4 NERVE ROOT AND L5 NERVE ROOT BILATERALLY (N/A)  SURGEON:  Surgeon(s) and Role:    * [name], MD - Primary    * Magnus Sinning, MD - Assisting  :   ASSISTANTS: Tarri Glenn MD   ANESTHESIA:   general  EBL:  Total I/O In: 1200 [I.V.:1200] Out: 380 [Urine:100; Other:100; Blood:180]  BLOOD ADMINISTERED:none  DRAINS: none   LOCAL MEDICATIONS USED:  MARCAINE 20cc of 0.50% with Epinephrine and at the end of the case,20cc of Exparel     SPECIMEN:  No Specimen  DISPOSITION OF SPECIMEN:  N/A  COUNTS:  YES  TOURNIQUET:  * No tourniquets in log *  DICTATION: .Other Dictation: Dictation Number [id number]  PLAN OF CARE: Admit for overnight observation  PATIENT DISPOSITION:  PACU - hemodynamically stable.   Delay start of Pharmacological VTE agent (>24hrs) due to surgical blood loss or risk of bleeding: yes

## 2015-02-15 NOTE — H&P (Signed)
Bryan [name] is an 69 y.o. male.   Chief Complaint: low back pain HPI: The patient is a 69 year old male who presented with the chief complaint of low back pain with radicular pain into the legs. He first presented in 2014 with this pain. No specific injury. He started to develop numbness and tingling in the LE as well. He failed conservative treatments including prednisone and activity modification. MRI and CT myelogram showed lumbar spinal stenosis.   Past Medical History  Diagnosis Date  . Obesity   . HTN (hypertension)   . BPH (benign prostatic hypertrophy)   . Seizures     AS A CHILD.  [name] Ulcer   . Hearing deficit     wears hearing aids bilateral  . GERD (gastroesophageal reflux disease)     reports for indigestion he uses mustard   . Arthritis     hands, knees, cervical area  . Essential tremor   . Sleep apnea     cpap - settings at 9 per patient     Past Surgical History  Procedure Laterality Date  . Tonsillectomy    . Eye surgery      right, growth excision  . Colonoscopy    . Vasectomy    . Anterior cervical decomp/discectomy fusion N/A 03/30/2013    Procedure: ANTERIOR CERVICAL DECOMPRESSION/DISCECTOMY FUSION 2 LEVELS;  Surgeon: Otilio Connors, MD;  Location: [facility];  Service: Neurosurgery;  Laterality: N/A;  C4-5 C5-6 Anterior cervical decompression/diskectomy/fusion/Allograft/Plate  . Spine surgery    . Wrist ganglion excision Left   . Cholecystectomy N/A 12/07/2013    Procedure: LAPAROSCOPIC CHOLECYSTECTOMY WITH INTRAOPERATIVE CHOLANGIOGRAM;  Surgeon: Adin Hector, MD;  Location: [facility];  Service: General;  Laterality: N/A;    Family History  Problem Relation Age of Onset  . Colon cancer Maternal Grandfather   . Diabetes Paternal Aunt     x 4 aunts  . Kidney disease Brother   . Stroke Maternal Grandmother    Social History:  reports that he quit smoking about 42 years ago. His smoking use included Cigarettes. He has never used smokeless tobacco. He  reports that he drinks alcohol. He reports that he does not use illicit drugs.  Allergies:  Allergies  Allergen Reactions  . Other     BLOOD PRODUCT REFUSAL      Current outpatient prescriptions:  .  acetaminophen (TYLENOL) 325 MG tablet, Take 650 mg by mouth every 6 (six) hours as needed (Pain)., Disp: , Rfl:  .  aspirin EC 81 MG tablet, Take 81 mg by mouth daily., Disp: , Rfl:  .  losartan-hydrochlorothiazide (HYZAAR) 50-12.5 MG per tablet, Take 1 tablet by mouth daily., Disp: 30 tablet, Rfl: 5 .  meloxicam (MOBIC) 15 MG tablet, Take 15 mg by mouth daily., Disp: , Rfl:  .  methocarbamol (ROBAXIN) 750 MG tablet, Take 750 mg by mouth daily. Patient is taking once daily, Disp: , Rfl:  .  nortriptyline (PAMELOR) 25 MG capsule, Take 25 mg by mouth at bedtime., Disp: , Rfl:  .  primidone (MYSOLINE) 50 MG tablet, Take 50 mg by mouth 2 (two) times daily. , Disp: , Rfl:  .  Tamsulosin HCl (FLOMAX) 0.4 MG CAPS, Take 0.4 mg by mouth at bedtime. , Disp: , Rfl:    Review of Systems  Constitutional: Positive for malaise/fatigue. Negative for fever, chills, weight loss and diaphoresis.  HENT: Negative.   Eyes: Negative.   Respiratory: Positive for shortness of breath. Negative  for cough, hemoptysis, sputum production and wheezing.        SOB with exertion  Cardiovascular: Negative.   Gastrointestinal: Negative.   Genitourinary: Negative.   Musculoskeletal: Positive for back pain and joint pain. Negative for myalgias, falls and neck pain.  Skin: Negative.   Neurological: Negative.  Negative for weakness.  Endo/Heme/Allergies: Negative.   Psychiatric/Behavioral: Positive for memory loss. Negative for depression, suicidal ideas, hallucinations and substance abuse. The patient is not nervous/anxious and does not have insomnia.     Vitals  Weight: 221 lb Height: 65in Body Surface Area: 2.06 m Body Mass Index: 36.78 kg/m  BP: 120/60 (Sitting, Left Arm, Standard)  Physical Exam   Constitutional: He is oriented to person, place, and time. He appears well-developed. No distress.  Overweight  HENT:  Head: Normocephalic and atraumatic.  Right Ear: External ear normal.  Left Ear: External ear normal.  Nose: Nose normal.  Mouth/Throat: Oropharynx is clear and moist.  Eyes: Conjunctivae and EOM are normal.  Neck: Normal range of motion. Neck supple.  Cardiovascular: Normal rate, regular rhythm, normal heart sounds and intact distal pulses.   No murmur heard. Respiratory: Effort normal and breath sounds normal. No respiratory distress. He has no wheezes.  GI: Soft. Bowel sounds are normal. He exhibits no distension. There is no tenderness.  Musculoskeletal:       Right hip: Normal.       Left hip: Normal.       Right knee: Normal.       Left knee: Normal.       Lumbar back: He exhibits pain and spasm.  Painful motion of the lumbar spine which causes some radicular pain into the legs  Neurological: He is alert and oriented to person, place, and time. He has normal strength and normal reflexes. No sensory deficit.  Skin: No rash noted. He is not diaphoretic. No erythema.  Psychiatric: He has a normal mood and affect. His behavior is normal.     Assessment/Plan Lumbar spinal stenosis L3-L4, L4-5 He needs a complete decompressive lumbar laminectomy L4-L5, perhaps L3-L4. The possible complications of spinal surgery number one could be infection, which is extremely rare. We do use antibiotics prior to the surgery and during surgery and after surgery. Number two is always a slight degree of probability that you could develop a blood clot in your leg after any type of surgery and we try our best to prevent that with aspirin post op when it is safe to begin. The third is a dural leak. That is the spinal fluid leak that could occur. At certain rare times the bone or the disc could literally stick to the dura which is the lining which contains the spinal fluid and we could develop  a small tear in that lining which we then patch up. That is an extremely rare complication. The last and final complication is a recurrent disc rupture. That means that you could rupture another small piece of disc later on down the road and there is about a 2% chance of that.   Bryan [name] LAUREN 02/15/2015, 8:44 AM

## 2015-02-15 NOTE — Transfer of Care (Signed)
Immediate Anesthesia Transfer of Care Note  Patient: Bryan [name]  Procedure(s) Performed: Procedure(s): COMPLETE DECOMPRESSIVE LUMBAR LAMINECTOMY L4-L5/ FORAMINOTOMY TO L4 NERVE ROOT AND L5 NERVE ROOT BILATERALLY (N/A)  Patient Location: PACU  Anesthesia Type:General  Level of Consciousness: awake, alert  and oriented  Airway & Oxygen Therapy: Patient Spontanous Breathing and Patient connected to face mask oxygen  Post-op Assessment: Report given to RN  Post vital signs: Reviewed and stable  Last Vitals:  Filed Vitals:   02/15/15 1009  BP: 158/76  Pulse: 84  Temp: 36.4 C  Resp: 18    Complications: No apparent anesthesia complications

## 2015-02-16 ENCOUNTER — Encounter (HOSPITAL_COMMUNITY): Payer: Self-pay | Admitting: Orthopedic Surgery

## 2015-02-16 DIAGNOSIS — E669 Obesity, unspecified: Secondary | ICD-10-CM | POA: Diagnosis not present

## 2015-02-16 DIAGNOSIS — K219 Gastro-esophageal reflux disease without esophagitis: Secondary | ICD-10-CM | POA: Diagnosis not present

## 2015-02-16 DIAGNOSIS — Z6836 Body mass index (BMI) 36.0-36.9, adult: Secondary | ICD-10-CM | POA: Diagnosis not present

## 2015-02-16 DIAGNOSIS — M4806 Spinal stenosis, lumbar region: Secondary | ICD-10-CM | POA: Diagnosis not present

## 2015-02-16 DIAGNOSIS — Z87891 Personal history of nicotine dependence: Secondary | ICD-10-CM | POA: Diagnosis not present

## 2015-02-16 DIAGNOSIS — I10 Essential (primary) hypertension: Secondary | ICD-10-CM | POA: Diagnosis not present

## 2015-02-16 DIAGNOSIS — M1389 Other specified arthritis, multiple sites: Secondary | ICD-10-CM | POA: Diagnosis not present

## 2015-02-16 DIAGNOSIS — G250 Essential tremor: Secondary | ICD-10-CM | POA: Diagnosis not present

## 2015-02-16 DIAGNOSIS — N400 Enlarged prostate without lower urinary tract symptoms: Secondary | ICD-10-CM | POA: Diagnosis not present

## 2015-02-16 DIAGNOSIS — Z7982 Long term (current) use of aspirin: Secondary | ICD-10-CM | POA: Diagnosis not present

## 2015-02-16 MED ORDER — METHOCARBAMOL 500 MG PO TABS: 500.0000 mg | ORAL_TABLET | Freq: Four times a day (QID) | ORAL | Status: DC | PRN

## 2015-02-16 MED ORDER — OXYCODONE-ACETAMINOPHEN 5-325 MG PO TABS: 1.0000 | ORAL_TABLET | ORAL | Status: DC | PRN

## 2015-02-16 NOTE — Discharge Instructions (Signed)
Change your dressing daily. Shower only, no tub bath. For the first few days, remove your dressing, tape a piece of saran wrap over your incision.  Take your shower, then remove the saran wrap and put a clean dressing on. After three days you can shower without the saran wrap.  Call if any temperatures greater than 101 or any wound complications: [phone] during the day and ask for Dr. Charlestine Night nurse, Brunilda Payor.

## 2015-02-16 NOTE — Progress Notes (Signed)
UR completed 

## 2015-02-16 NOTE — Op Note (Signed)
NAMECANON, GOLA NO.:  [id number]  MEDICAL RECORD NO.:  [id number]  LOCATION:  37                         FACILITY:  [facility]  PHYSICIAN:  Kipp Brood. Damia Bobrowski, M.D.DATE OF BIRTH:  03/18/1946  DATE OF PROCEDURE:  02/15/2015 DATE OF DISCHARGE:                              OPERATIVE REPORT   SURGEON:  Kipp Brood. Gladstone Lighter, M.D.  OPERATIVE ASSISTANT:  Tarri Glenn, M.D.  PREOPERATIVE DIAGNOSES: 1. Severe spinal stenosis at L4-5 with partial at 3-4 as well. 2. Foraminal stenosis for the L4 and the L5 root bilaterally.  POSTOPERATIVE DIAGNOSES: 1. Severe spinal stenosis at L4-5 with partial at 3-4 as well. 2. Foraminal stenosis for the L4 and the L5 root bilaterally.  OPERATION: 1. Complete decompressive lumbar laminectomy at L4-5 for spinal     stenosis. 2. Foraminotomies for the L4 and the L5 root bilaterally.  DESCRIPTION OF PROCEDURE:  Under general anesthesia, routine orthopedic prepping and draping of the lower back was carried out.  Appropriate time-out was carried out.  I also marked the appropriate left side of the back in the holding area even though we were going central, now he had some discomfort in his left leg in the holding area, but preop he mentioned he had bilateral leg pain.  The procedure was started with the patient on spinal frame after the time-out and after the prep.  Two needles were placed in the back for localization purposes.  The patient had 2 g IV Ancef.  Once we localized where our incision was going to be, an incision was made directly over the spinous processes, the L4 and L5 region.  I then stripped the muscle from the lamina and spinous process bilaterally.  Self-retaining retractors were inserted.  Instruments were placed on the spinous processes.  Another x-ray was taken to verify the position.  At this particular time, I then went down and removed the spinous process of the L4.  I removed a small part of L3 and a  small part of L5.  We went down and centrally and decompressed the L4-5 area. Note, the canal was extremely tight.  Great care was taken not to injure the dura and the nerve root.  We brought the microscope in, in the usual fashion and then carried out our dissection.  We first went out laterally, decompressed the recesses and left ligamentum flavum intact to protect the dura.  We then went down and removed the ligamentum flavum in the usual fashion and we utilized a cottonoid to protect the dura at this time.  We then went out far laterally and decompressed the lateral recess on the left and then on the right.  We did bilateral decompressions.  We got into the foramina and made sure we had nice foraminotomies as well.  Before we completed the case, we utilized a hockey-stick to go proximally, distally, and out laterally into the foramen to make sure we had a nice decompression we did.  We noted that the ligamentum flavum was extremely thick at the L4-5 space.  The dura now was free.  We thoroughly irrigated out the area loosely, applied some thrombin-soaked Gelfoam, and closed the wound  in layers in the usual fashion.  I left a small distal deep and proximal part of the wound open for drainage purposes and the subcu was closed with #1 Vicryl and the skin with metal staples now.  At the beginning of the procedure, I injected 20 mL of 0.5% Marcaine with epinephrine into the soft tissue. Note also at the end of the procedure, that I was used for to control bleeding.  At the end of procedure, I injected 20 mL of Exparel into the muscle and soft tissue.  Sterile Neosporin dressing was applied.  The patient left the operating room in satisfactory condition.  SURGEON:  Kipp Brood. Gladstone Lighter, M.D.  ASSISTANT:  Tarri Glenn, M.D.          ______________________________ Kipp Brood. Gladstone Lighter, M.D.     RAG/MEDQ  D:  02/15/2015  T:  02/16/2015  Job:  [id number]

## 2015-02-16 NOTE — Progress Notes (Signed)
Subjective: 1 Day Post-Op Procedure(s) (LRB): COMPLETE DECOMPRESSIVE LUMBAR LAMINECTOMY L4-L5/ FORAMINOTOMY TO L4 NERVE ROOT AND L5 NERVE ROOT BILATERALLY (N/A) Patient reports pain as 3 on 0-10 scale. Doing well .Moves lowers well. Will ambulate and then DC.   Objective: Vital signs in last 24 hours: Temp:  [97.8 F (36.6 C)-100.2 F (37.9 C)] 100.2 F (37.9 C) (04/06 0456) Pulse Rate:  [60-88] 87 (04/06 0456) Resp:  [9-22] 20 (04/06 0456) BP: (122-171)/(69-92) 144/73 mmHg (04/06 0456) SpO2:  [93 %-100 %] 96 % (04/06 0456) Weight:  [103.42 kg (228 lb)] 103.42 kg (228 lb) (04/05 1708)  Intake/Output from previous day: 04/05 0701 - 04/06 0700 In: 1605 [I.V.:1550; IV Piggyback:55] Out: 880 [Urine:600; Blood:180] Intake/Output this shift: Total I/O In: -  Out: 100 [Urine:100]  No results for input(s): HGB in the last 72 hours. No results for input(s): WBC, RBC, HCT, PLT in the last 72 hours. No results for input(s): NA, K, CL, CO2, BUN, CREATININE, GLUCOSE, CALCIUM in the last 72 hours. No results for input(s): LABPT, INR in the last 72 hours.  Neurologically intact  Assessment/Plan: 1 Day Post-Op Procedure(s) (LRB): COMPLETE DECOMPRESSIVE LUMBAR LAMINECTOMY L4-L5/ FORAMINOTOMY TO L4 NERVE ROOT AND L5 NERVE ROOT BILATERALLY (N/A) Up with therapy Discharge home with home health  Ernie Kasler A 02/16/2015, 10:53 AM

## 2015-02-16 NOTE — Progress Notes (Signed)
Physical Therapy Evaluation Patient Details Name: Bryan [name] MRN: [id number] DOB: 11-10-1946 Today's Date: 02/16/2015   History of Present Illness  L4-5 lumbar decompression 2* spinal stenosis  Clinical Impression  Pt s/p back surgery and presents with functional mobility limitations 2* post op pain and back precautions.  Pt plans dc to home with assist of spouse.    Follow Up Recommendations No PT follow up    Equipment Recommendations  Rolling walker with 5" wheels;3in1 (PT)    Recommendations for Other Services OT consult     Precautions / Restrictions Precautions Precautions: Back Precaution Booklet Issued: Yes (comment) Precaution Comments: All precuations reviewed x 2 Restrictions Weight Bearing Restrictions: No      Mobility  Bed Mobility Overal bed mobility: Needs Assistance             General bed mobility comments: Moved to EOB with assist of bed and nursing   Transfers Overall transfer level: Needs assistance Equipment used: Rolling walker (2 wheeled) Transfers: Sit to/from Stand Sit to Stand: From elevated surface;Min assist         General transfer comment: cues for transition position, use of UEs to self assist and adherence to back precautions  Ambulation/Gait Ambulation/Gait assistance: Min assist;Mod assist Ambulation Distance (Feet): 24 Feet Assistive device: Rolling walker (2 wheeled) Gait Pattern/deviations: Step-to pattern;Step-through pattern;Decreased step length - right;Decreased step length - left;Shuffle;Trunk flexed Gait velocity: decr   General Gait Details: Pt wtih halting step 2* pain.  Very short stride length and increased time for movement.  Cues for posture and position from RW  Stairs            Wheelchair Mobility    Modified Rankin (Stroke Patients Only)       Balance                                             Pertinent Vitals/Pain Pain Assessment: 0-10 Pain Score: 8  Pain  Location: back and R knee Pain Descriptors / Indicators: Aching;Sore Pain Intervention(s): Limited activity within patient's tolerance;Monitored during session;Premedicated before session    [place] expects to be discharged to:: Private residence Living Arrangements: Spouse/significant other Available Help at Discharge: Family Type of Home: House Home Access: Stairs to enter   Technical brewer of Steps: 1 Home Layout: One level Home Equipment: None      Prior Function Level of Independence: Needs assistance               Hand Dominance        Extremity/Trunk Assessment   Upper Extremity Assessment: Overall WFL for tasks assessed           Lower Extremity Assessment: Overall WFL for tasks assessed         Communication   Communication: HOH  Cognition Arousal/Alertness: Awake/alert Behavior During Therapy: WFL for tasks assessed/performed Overall Cognitive Status: Within Functional Limits for tasks assessed                      General Comments      Exercises        Assessment/Plan    PT Assessment Patient needs continued PT services  PT Diagnosis Difficulty walking   PT Problem List Decreased strength;Decreased activity tolerance;Decreased mobility;Decreased knowledge of use of DME;Obesity;Pain;Decreased knowledge of precautions  PT Treatment Interventions DME instruction;Gait training;Stair training;Functional mobility  training;Therapeutic activities;Therapeutic exercise;Patient/family education   PT Goals (Current goals can be found in the Care Plan section) Acute Rehab PT Goals Patient Stated Goal: I want the pain to be gone PT Goal Formulation: With patient Time For Goal Achievement: 02/26/15 Potential to Achieve Goals: Good    Frequency Min 6X/week   Barriers to discharge        Co-evaluation               End of Session   Activity Tolerance: Patient limited by pain Patient left: in chair;with  call bell/phone within reach Nurse Communication: Mobility status    Functional Assessment Tool Used: clinical judgement Functional Limitation: Mobility: Walking and moving around Mobility: Walking and Moving Around Current Status (L3734): At least 40 percent but less than 60 percent impaired, limited or restricted Mobility: Walking and Moving Around Goal Status [phone]): At least 1 percent but less than 20 percent impaired, limited or restricted    Time: 0847-0910 PT Time Calculation (min) (ACUTE ONLY): 23 min   Charges:   PT Evaluation $Initial PT Evaluation Tier I: 1 Procedure PT Treatments $Gait Training: 8-22 mins   PT G Codes:   PT G-Codes **NOT FOR INPATIENT CLASS** Functional Assessment Tool Used: clinical judgement Functional Limitation: Mobility: Walking and moving around Mobility: Walking and Moving Around Current Status (T1572): At least 40 percent but less than 60 percent impaired, limited or restricted Mobility: Walking and Moving Around Goal Status [phone]): At least 1 percent but less than 20 percent impaired, limited or restricted    Thais Silberstein 02/16/2015, 9:17 AM

## 2015-02-16 NOTE — Evaluation (Addendum)
Occupational Therapy Evaluation Patient Details Name: Bryan [name] MRN: [id number] DOB: November 27, 1945 Today's Date: 02/16/2015    History of Present Illness L4-5 lumbar decompression 2* spinal stenosis   Clinical Impression   Pt limited by 9/10 pain with activity. Educated on [name] transfers and AE options for LB self care as well as his back precautions. He will benefit from one more day on acute for therapy. Will follow to progress ADL independence.     Follow Up Recommendations  No OT follow up;Supervision/Assistance - 24 hour    Equipment Recommendations  3 in 1 bedside comode    Recommendations for Other Services       Precautions / Restrictions Precautions Precautions: Back Precaution Booklet Issued: Yes (comment) Precaution Comments: reviewed all precautions with pt Restrictions Weight Bearing Restrictions: No      Mobility Bed Mobility Overal bed mobility: Needs Assistance Bed Mobility: Rolling;Sit to Sidelying Rolling: Min guard       Sit to sidelying: Min assist General bed mobility comments: verbal cues for technique.  Transfers Overall transfer level: Needs assistance Equipment used: Rolling walker (2 wheeled) Transfers: Sit to/from Stand Sit to Stand: Min assist         General transfer comment: cues for hand placement and back precautions.    Balance                                            ADL Overall ADL's : Needs assistance/impaired Eating/Feeding: Independent;Sitting   Grooming: Wash/dry hands;Set up;Sitting   Upper Body Bathing: Supervision/ safety;Set up;Sitting   Lower Body Bathing: Maximal assistance;Sit to/from stand   Upper Body Dressing : Minimal assistance;Sitting   Lower Body Dressing: Sit to/from stand;Maximal assistance   Toilet Transfer: Minimal assistance;Stand-pivot;BSC;RW   Toileting- Clothing Manipulation and Hygiene: Moderate assistance;Sit to/from stand         General ADL Comments: Pt  states he couldnt cross LE s up before surgery and will likely need to have assist with LB dressing. Educated on AE options and pt will discuss with his wife. Pt is also not able to reach posterior periarea without twisting, so educated on toilet aid option and coverage. Pt limited by 9/10 pain this visit. Practiced on and off [name]. Educated on tub transfer that he will need to sponge bathe initially rather than stepping over the tub right now for safety until LE strength and pain improved.     Vision     Perception     Praxis      Pertinent Vitals/Pain Pain Assessment: 0-10 Pain Score: 9  Pain Location: back and R knee Pain Descriptors / Indicators: Aching Pain Intervention(s): Repositioned     Hand Dominance     Extremity/Trunk Assessment Upper Extremity Assessment Upper Extremity Assessment: Overall WFL for tasks assessed          Communication Communication Communication: HOH   Cognition Arousal/Alertness: Awake/alert Behavior During Therapy: WFL for tasks assessed/performed Overall Cognitive Status: Within Functional Limits for tasks assessed                     General Comments       Exercises       Shoulder Instructions      Home Living Family/patient expects to be discharged to:: Private residence Living Arrangements: Spouse/significant other Available Help at Discharge: Family Type of Home: House Home Access: Stairs  to enter Entrance Stairs-Number of Steps: 1   Home Layout: One level     Bathroom Shower/Tub: Teacher, early years/pre: Standard     Home Equipment: None          Prior Functioning/Environment Level of Independence: Needs assistance             OT Diagnosis: Generalized weakness;Acute pain   OT Problem List: Decreased strength;Decreased knowledge of use of DME or AE;Pain;Decreased knowledge of precautions   OT Treatment/Interventions: Self-care/ADL training;Patient/family education;Therapeutic  activities;DME and/or AE instruction    OT Goals(Current goals can be found in the care plan section) Acute Rehab OT Goals Patient Stated Goal: pain decreased OT Goal Formulation: With patient Time For Goal Achievement: 02/23/15 Potential to Achieve Goals: Good  OT Frequency: Min 2X/week   Barriers to D/C:            Co-evaluation              End of Session Equipment Utilized During Treatment: Rolling walker  Activity Tolerance: Patient limited by pain Patient left: in bed;with call bell/phone within reach   Time: 1010-1037 OT Time Calculation (min): 27 min Charges:  OT General Charges $OT Visit: 1 Procedure OT Evaluation $Initial OT Evaluation Tier I: 1 Procedure OT Treatments $Therapeutic Activity: 8-22 mins G-Codes:    Jules Schick  [phone] 02/16/2015, 10:46 AM

## 2015-02-16 NOTE — Progress Notes (Signed)
Physical Therapy Treatment Patient Details Name: Bryan [name] MRN: [id number] DOB: 10-02-1946 Today's Date: 02/16/2015    History of Present Illness L4-5 lumbar decompression 2* spinal stenosis    PT Comments    Improvement in activity tolerance from am but pt continues to require assist for all mobility, most notably for balance/support with onset muscle spasm.  Follow Up Recommendations  No PT follow up     Equipment Recommendations  Rolling walker with 5" wheels;3in1 (PT)    Recommendations for Other Services OT consult     Precautions / Restrictions Precautions Precautions: Back Precaution Booklet Issued: Yes (comment) Precaution Comments: reviewed all precautions with pt Restrictions Weight Bearing Restrictions: No    Mobility  Bed Mobility Overal bed mobility: Needs Assistance Bed Mobility: Supine to Sit;Sit to Supine     Supine to sit: Min assist Sit to supine: Min assist;Mod assist   General bed mobility comments: verbal cues for technique.  Assist to manage LEs in/out of bed  Transfers Overall transfer level: Needs assistance Equipment used: Rolling walker (2 wheeled) Transfers: Sit to/from Stand Sit to Stand: Min assist         General transfer comment: cues for hand placement and back precautions.  Ambulation/Gait Ambulation/Gait assistance: Min assist Ambulation Distance (Feet): 62 Feet Assistive device: Rolling walker (2 wheeled) Gait Pattern/deviations: Step-to pattern;Step-through pattern;Decreased step length - right;Decreased step length - left;Shuffle;Trunk flexed Gait velocity: decr   General Gait Details: cues for posture and position from RW.  Assist for balance with onset muscle spasm   Stairs Stairs: Yes Stairs assistance: +2 physical assistance Stair Management: No rails;Step to pattern;Backwards;Forwards;With walker Number of Stairs: 4 General stair comments: 2 single steps fwd to enter home. 2 single steps bkwd to practice  stool into high bed.  Cues for sequence and foot/RW placement. Physical assist for balance/support with onset muscle spasm  Wheelchair Mobility    Modified Rankin (Stroke Patients Only)       Balance                                    Cognition Arousal/Alertness: Awake/alert Behavior During Therapy: WFL for tasks assessed/performed Overall Cognitive Status: Within Functional Limits for tasks assessed                      Exercises      General Comments        Pertinent Vitals/Pain Pain Assessment: 0-10 Pain Score: 7  Pain Location: back Pain Descriptors / Indicators: Spasm Pain Intervention(s): Limited activity within patient's tolerance;Monitored during session;Premedicated before session;Patient requesting pain meds-RN notified    Home Living                      Prior Function            PT Goals (current goals can now be found in the care plan section) Acute Rehab PT Goals Patient Stated Goal: pain decreased PT Goal Formulation: With patient Time For Goal Achievement: 02/26/15 Potential to Achieve Goals: Good Progress towards PT goals: Progressing toward goals    Frequency  Min 6X/week    PT Plan Current plan remains appropriate    Co-evaluation             End of Session   Activity Tolerance: Patient limited by pain Patient left: in bed;with call bell/phone within reach     Time:  1224-4975 PT Time Calculation (min) (ACUTE ONLY): 25 min  Charges:  $Gait Training: 23-37 mins                    G Codes:      Bryan [name] 06-Mar-2015, 5:12 PM

## 2015-02-16 NOTE — Progress Notes (Signed)
   02/16/15 1046  OT G-codes **NOT FOR INPATIENT CLASS**  Functional Assessment Tool Used clinical judgement  Functional Limitation Self care  Self Care Current Status [phone]) CK  Self Care Goal Status (E7209) CI  Pauline Aus OTR/L [phone] 02/16/2015

## 2015-02-17 DIAGNOSIS — K219 Gastro-esophageal reflux disease without esophagitis: Secondary | ICD-10-CM | POA: Diagnosis not present

## 2015-02-17 DIAGNOSIS — M1389 Other specified arthritis, multiple sites: Secondary | ICD-10-CM | POA: Diagnosis not present

## 2015-02-17 DIAGNOSIS — E669 Obesity, unspecified: Secondary | ICD-10-CM | POA: Diagnosis not present

## 2015-02-17 DIAGNOSIS — G250 Essential tremor: Secondary | ICD-10-CM | POA: Diagnosis not present

## 2015-02-17 DIAGNOSIS — N400 Enlarged prostate without lower urinary tract symptoms: Secondary | ICD-10-CM | POA: Diagnosis not present

## 2015-02-17 DIAGNOSIS — I10 Essential (primary) hypertension: Secondary | ICD-10-CM | POA: Diagnosis not present

## 2015-02-17 DIAGNOSIS — Z87891 Personal history of nicotine dependence: Secondary | ICD-10-CM | POA: Diagnosis not present

## 2015-02-17 DIAGNOSIS — M4806 Spinal stenosis, lumbar region: Secondary | ICD-10-CM | POA: Diagnosis not present

## 2015-02-17 DIAGNOSIS — Z6836 Body mass index (BMI) 36.0-36.9, adult: Secondary | ICD-10-CM | POA: Diagnosis not present

## 2015-02-17 DIAGNOSIS — Z7982 Long term (current) use of aspirin: Secondary | ICD-10-CM | POA: Diagnosis not present

## 2015-02-17 NOTE — Progress Notes (Signed)
Subjective: 2 Days Post-Op Procedure(s) (LRB): COMPLETE DECOMPRESSIVE LUMBAR LAMINECTOMY L4-L5/ FORAMINOTOMY TO L4 NERVE ROOT AND L5 NERVE ROOT BILATERALLY (N/A) Patient reports pain as 2 on 0-10 scale.  Doing well. Ready for DC.  Objective: Vital signs in last 24 hours: Temp:  [99.9 F (37.7 C)-100.5 F (38.1 C)] 100.5 F (38.1 C) (04/07 0435) Pulse Rate:  [93-106] 106 (04/07 0435) Resp:  [20-22] 22 (04/07 0435) BP: (112-125)/(46-56) 112/46 mmHg (04/07 0435) SpO2:  [91 %-95 %] 93 % (04/07 0435)  Intake/Output from previous day: 04/06 0701 - 04/07 0700 In: -  Out: 450 [Urine:450] Intake/Output this shift: Total I/O In: 240 [P.O.:240] Out: 325 [Urine:325]  No results for input(s): HGB in the last 72 hours. No results for input(s): WBC, RBC, HCT, PLT in the last 72 hours. No results for input(s): NA, K, CL, CO2, BUN, CREATININE, GLUCOSE, CALCIUM in the last 72 hours. No results for input(s): LABPT, INR in the last 72 hours.  Neurologically intact  Assessment/Plan: 2 Days Post-Op Procedure(s) (LRB): COMPLETE DECOMPRESSIVE LUMBAR LAMINECTOMY L4-L5/ FORAMINOTOMY TO L4 NERVE ROOT AND L5 NERVE ROOT BILATERALLY (N/A) Discharge home with home health.  Bryan [name] A 02/17/2015, 12:52 PM

## 2015-02-17 NOTE — Progress Notes (Signed)
Occupational Therapy Treatment Patient Details Name: Bryan [name] MRN: [id number] DOB: Jan 30, 1946 Today's Date: 02/17/2015    History of present illness L4-5 lumbar decompression 2* spinal stenosis   OT comments  Pt at 3-4/10 pain at start of session and up to 9/10 when starting to mobilize. As session progressed pain improved to 7/10 and by the time he was sitting in the chair at the end of session, pain was down to 4/10. Pt tolerated up to 3in1 and stood to groom. Educated on AE options and practiced with AE. Pt will need 24/7 assist at d/c initially.    Follow Up Recommendations  No OT follow up;Supervision/Assistance - 24 hour    Equipment Recommendations  3 in 1 bedside comode    Recommendations for Other Services      Precautions / Restrictions Precautions Precautions: Back Precaution Comments: Reviewed back precautions with pt       Mobility Bed Mobility                  Transfers Overall transfer level: Needs assistance Equipment used: Rolling walker (2 wheeled) Transfers: Sit to/from Stand Sit to Stand: Min guard;Min assist         General transfer comment: min cues for hand placement and back precautions.    Balance                                   ADL                       Lower Body Dressing: Minimal assistance;Sitting/lateral leans;With adaptive equipment;Cueing for back precautions Lower Body Dressing Details (indicate cue type and reason): pt used reacher to doff R sock with min assist for placement of reacher down into sock and then pt able to doff sock. Pt used sock aid to don sock with min assist also. Demonstrated how to don pants/underwear with reacher, long shoe horn use and Long handle sponge.  Toilet Transfer: Minimal assistance;Ambulation;BSC;RW             General ADL Comments: Pt continues to be limited by pain 9/10 at start of session moving and improved to 7/10 once in bathroom. Nursing informed. Pt  states wife plans to purchase toilet aid and AE kit in gift shop today. Pt needs min/mod verbal cues for safety and for back precautions. He tends to bend forward during tasks and reminded him of his no bending precaution.       Vision                     Perception     Praxis      Cognition   Behavior During Therapy: WFL for tasks assessed/performed Overall Cognitive Status: Within Functional Limits for tasks assessed                       Extremity/Trunk Assessment               Exercises     Shoulder Instructions       General Comments      Pertinent Vitals/ Pain       Pain Assessment: 0-10 Pain Score: 9  Pain Location: back. down to 7 once up for a bit; 4 at rest at end of session Pain Descriptors / Indicators: Aching Pain Intervention(s): Repositioned;Patient requesting pain meds-RN notified  Home Living  Prior Functioning/Environment              Frequency Min 2X/week     Progress Toward Goals  OT Goals(current goals can now be found in the care plan section)  Progress towards OT goals: Progressing toward goals     Plan Discharge plan remains appropriate    Co-evaluation                 End of Session Equipment Utilized During Treatment: Rolling walker   Activity Tolerance Patient limited by fatigue   Patient Left in chair;with call bell/phone within reach   Nurse Communication          Time: 5537-4827 OT Time Calculation (min): 28 min  Charges: OT General Charges $OT Visit: 1 Procedure OT Treatments $Self Care/Home Management : 8-22 mins $Therapeutic Activity: 8-22 mins  Jules Schick  [phone] 02/17/2015, 11:14 AM

## 2015-02-17 NOTE — Progress Notes (Signed)
Physical Therapy Treatment Patient Details Name: Bryan [name] MRN: [id number] DOB: 12-14-1945 Today's Date: 02/17/2015    History of Present Illness L4-5 lumbar decompression 2* spinal stenosis    PT Comments    Improvement in activity tolerance and ambulatory stability.  Pt feels more comfortable with dc this date.  Follow Up Recommendations  No PT follow up     Equipment Recommendations  Rolling walker with 5" wheels;3in1 (PT)    Recommendations for Other Services OT consult     Precautions / Restrictions Precautions Precautions: Back Precaution Comments: Reviewed back precautions with pt Restrictions Weight Bearing Restrictions: No    Mobility  Bed Mobility Overal bed mobility: Needs Assistance Bed Mobility: Supine to Sit;Sit to Supine Rolling: Supervision   Supine to sit: Min guard Sit to supine: Min guard   General bed mobility comments: cues for correct log roll and sequence, in/out bed x 2  Transfers Overall transfer level: Needs assistance Equipment used: Rolling walker (2 wheeled) Transfers: Sit to/from Stand Sit to Stand: Min guard;Min assist         General transfer comment: min cues for hand placement and back precautions.  Ambulation/Gait Ambulation/Gait assistance: Min guard Ambulation Distance (Feet): 120 Feet Assistive device: Rolling walker (2 wheeled) Gait Pattern/deviations: Step-through pattern;Decreased step length - right;Decreased step length - left;Shuffle;Trunk flexed Gait velocity: decr   General Gait Details: cues for posture and position from RW   Stairs Stairs: Yes Stairs assistance: Min assist Stair Management: No rails;Step to pattern;Forwards;Backwards;With walker Number of Stairs: 4 General stair comments: 2 single steps fwd to enter home. 2 single steps bkwd to practice stool into high bed.  Cues for sequence and foot/RW placement.   Wheelchair Mobility    Modified Rankin (Stroke Patients Only)        Balance                                    Cognition Arousal/Alertness: Awake/alert Behavior During Therapy: WFL for tasks assessed/performed Overall Cognitive Status: Within Functional Limits for tasks assessed                      Exercises      General Comments        Pertinent Vitals/Pain Pain Assessment: 0-10 Pain Score: 9  Pain Location: back. down to 7 once up for a bit; 4 at rest at end of session Pain Descriptors / Indicators: Aching Pain Intervention(s): Repositioned;Patient requesting pain meds-RN notified    Home Living                      Prior Function            PT Goals (current goals can now be found in the care plan section) Acute Rehab PT Goals Patient Stated Goal: pain decreased PT Goal Formulation: With patient Time For Goal Achievement: 02/26/15 Potential to Achieve Goals: Good Progress towards PT goals: Progressing toward goals    Frequency  Min 6X/week    PT Plan Current plan remains appropriate    Co-evaluation             End of Session   Activity Tolerance: Patient tolerated treatment well;Patient limited by fatigue Patient left: in chair;with call bell/phone within reach     Time: 0928-0951 PT Time Calculation (min) (ACUTE ONLY): 23 min  Charges:  $Gait Training: 23-37 mins  G Codes:      Tylin Force 2015-02-25, 12:18 PM

## 2015-02-17 NOTE — Progress Notes (Signed)
Discharge instructions given to pt, verbalized understanding. Left the unit in stable condition. 

## 2015-02-17 NOTE — Progress Notes (Signed)
UR completed 

## 2015-02-21 NOTE — Discharge Summary (Signed)
Physician Discharge Summary   Patient ID: Bryan [name] MRN: [id number] DOB/AGE: May 17, 1946 69 y.o.  Admit date: 02/15/2015 Discharge date: 02/17/2015  Primary Diagnosis: Lumbar spinal stenosis  Admission Diagnoses:  Past Medical History  Diagnosis Date  . Obesity   . HTN (hypertension)   . BPH (benign prostatic hypertrophy)   . Seizures     AS A CHILD.  [name] Ulcer   . Hearing deficit     wears hearing aids bilateral  . GERD (gastroesophageal reflux disease)     reports for indigestion he uses mustard   . Arthritis     hands, knees, cervical area  . Essential tremor   . Sleep apnea     cpap - settings at 9 per patient    Discharge Diagnoses:   Active Problems:   Spinal stenosis, lumbar region, with neurogenic claudication  Estimated body mass index is 37.94 kg/(m^2) as calculated from the following:   Height as of this encounter: 5' 5"  (1.651 m).   Weight as of this encounter: 103.42 kg (228 lb).  Procedure:  Procedure(s) (LRB): COMPLETE DECOMPRESSIVE LUMBAR LAMINECTOMY L4-L5/ FORAMINOTOMY TO L4 NERVE ROOT AND L5 NERVE ROOT BILATERALLY (N/A)   Consults: None  HPI: The patient is a 69 year old male who presented with the chief complaint of low back pain with radicular pain into the legs. He first presented in 2014 with this pain. No specific injury. He started to develop numbness and tingling in the LE as well. He failed conservative treatments including prednisone and activity modification. MRI and CT myelogram showed lumbar spinal stenosis.   Laboratory Data: Hospital Outpatient Visit on 02/08/2015  Component Date Value Ref Range Status  . aPTT 02/08/2015 30  24 - 37 seconds Final  . WBC 02/08/2015 6.2  4.0 - 10.5 K/uL Final  . RBC 02/08/2015 4.20* 4.22 - 5.81 MIL/uL Final  . Hemoglobin 02/08/2015 12.3* 13.0 - 17.0 g/dL Final  . HCT 02/08/2015 38.2* 39.0 - 52.0 % Final  . MCV 02/08/2015 91.0  78.0 - 100.0 fL Final  . MCH 02/08/2015 29.3  26.0 - 34.0 pg Final  .  MCHC 02/08/2015 32.2  30.0 - 36.0 g/dL Final  . RDW 02/08/2015 14.5  11.5 - 15.5 % Final  . Platelets 02/08/2015 238  150 - 400 K/uL Final  . Neutrophils Relative % 02/08/2015 65  43 - 77 % Final  . Neutro Abs 02/08/2015 4.0  1.7 - 7.7 K/uL Final  . Lymphocytes Relative 02/08/2015 25  12 - 46 % Final  . Lymphs Abs 02/08/2015 1.6  0.7 - 4.0 K/uL Final  . Monocytes Relative 02/08/2015 7  3 - 12 % Final  . Monocytes Absolute 02/08/2015 0.5  0.1 - 1.0 K/uL Final  . Eosinophils Relative 02/08/2015 3  0 - 5 % Final  . Eosinophils Absolute 02/08/2015 0.2  0.0 - 0.7 K/uL Final  . Basophils Relative 02/08/2015 0  0 - 1 % Final  . Basophils Absolute 02/08/2015 0.0  0.0 - 0.1 K/uL Final  . Sodium 02/08/2015 140  135 - 145 mmol/L Final  . Potassium 02/08/2015 3.8  3.5 - 5.1 mmol/L Final  . Chloride 02/08/2015 105  96 - 112 mmol/L Final  . CO2 02/08/2015 30  19 - 32 mmol/L Final  . Glucose, Bld 02/08/2015 95  70 - 99 mg/dL Final  . BUN 02/08/2015 19  6 - 23 mg/dL Final  . Creatinine, Ser 02/08/2015 0.93  0.50 - 1.35 mg/dL Final  . Calcium  02/08/2015 9.2  8.4 - 10.5 mg/dL Final  . Total Protein 02/08/2015 7.6  6.0 - 8.3 g/dL Final  . Albumin 02/08/2015 3.9  3.5 - 5.2 g/dL Final  . AST 02/08/2015 35  0 - 37 U/L Final  . ALT 02/08/2015 37  0 - 53 U/L Final  . Alkaline Phosphatase 02/08/2015 130* 39 - 117 U/L Final  . Total Bilirubin 02/08/2015 0.4  0.3 - 1.2 mg/dL Final  . GFR calc non Af Amer 02/08/2015 83* >90 mL/min Final  . GFR calc Af Amer 02/08/2015 >90  >90 mL/min Final   Comment: (NOTE) The eGFR has been calculated using the CKD EPI equation. This calculation has not been validated in all clinical situations. eGFR's persistently <90 mL/min signify possible Chronic Kidney Disease.   . Anion gap 02/08/2015 5  5 - 15 Final  . Prothrombin Time 02/08/2015 13.5  11.6 - 15.2 seconds Final  . INR 02/08/2015 1.02  0.00 - 1.49 Final  . Color, Urine 02/08/2015 YELLOW  YELLOW Final  . APPearance  02/08/2015 CLEAR  CLEAR Final  . Specific Gravity, Urine 02/08/2015 1.023  1.005 - 1.030 Final  . pH 02/08/2015 5.5  5.0 - 8.0 Final  . Glucose, UA 02/08/2015 NEGATIVE  NEGATIVE mg/dL Final  . Hgb urine dipstick 02/08/2015 NEGATIVE  NEGATIVE Final  . Bilirubin Urine 02/08/2015 NEGATIVE  NEGATIVE Final  . Ketones, ur 02/08/2015 NEGATIVE  NEGATIVE mg/dL Final  . Protein, ur 02/08/2015 NEGATIVE  NEGATIVE mg/dL Final  . Urobilinogen, UA 02/08/2015 0.2  0.0 - 1.0 mg/dL Final  . Nitrite 02/08/2015 NEGATIVE  NEGATIVE Final  . Leukocytes, UA 02/08/2015 NEGATIVE  NEGATIVE Final   MICROSCOPIC NOT DONE ON URINES WITH NEGATIVE PROTEIN, BLOOD, LEUKOCYTES, NITRITE, OR GLUCOSE <1000 mg/dL.  [name] MRSA, PCR 02/08/2015 NEGATIVE  NEGATIVE Final  . Staphylococcus aureus 02/08/2015 NEGATIVE  NEGATIVE Final   Comment:        The Xpert SA Assay (FDA approved for NASAL specimens in patients over 50 years of age), is one component of a comprehensive surveillance program.  Test performance has been validated by [facility] for patients greater than or equal to 18 year old. It is not intended to diagnose infection nor to guide or monitor treatment.   . Transfuse no blood products 02/08/2015 TRANSFUSE NO BLOOD PRODUCTS, VERIFIED BY Florentina Jenny Phillips,RN   Final     X-Rays:Dg Chest 2 View  02/08/2015   CLINICAL DATA:  Prior history of smoking.  Lumbar spine surgery.  EXAM: CHEST  2 VIEW  COMPARISON:  06/27/2014.  FINDINGS: The heart size and mediastinal contours are within normal limits. Both lungs are clear. The visualized skeletal structures are unremarkable. Prior cervical spine fusion.  IMPRESSION: No acute cardiopulmonary disease.   Electronically Signed   By: Marcello Moores  Register   On: 02/08/2015 08:51   Dg Spine Portable 1 View  02/15/2015   CLINICAL DATA:  Lumbar laminectomy.  EXAM: PORTABLE SPINE - 1 VIEW  COMPARISON:  None.  FINDINGS: Single lateral intraoperative projection of the lumbar spine was obtained. This  demonstrates surgical probe projected over posterior elements of L4. Multilevel degenerative disc disease is noted.  IMPRESSION: Surgical localization of posterior elements of L4.   Electronically Signed   By: Marijo Conception, M.D.   On: 02/15/2015 14:29   Dg Spine Portable 1 View  02/15/2015   CLINICAL DATA:  Decompressive lumbar laminectomy L4-5  EXAM: PORTABLE SPINE - 1 VIEW  COMPARISON:  Study obtained earlier in the  day.  FINDINGS: Cross-table lateral image is labeled image 2. There is a metallic probe with its tip posterior to L4-5. A second probe has its tip posterior to the mid aspect of the L5 vertebral body. A sponges noted immediately inferior to the more inferiorly placed probe. There is generalized osteoarthritic change. Mild spondylolisthesis at L4-5 persists.  IMPRESSION: Probe tips are posterior to L4-5 and posterior to the mid aspect of the L5 vertebral body. Note sponge just inferior to the more inferior probe. Stable arthropathy and mild spondylolisthesis at L4-5.   Electronically Signed   By: Lowella Grip III M.D.   On: 02/15/2015 14:14   Dg Spine Portable 1 View  02/15/2015   CLINICAL DATA:  Decompressive lumbar laminectomy L4-5  EXAM: PORTABLE SPINE - 1 VIEW  COMPARISON:  Lumbar spine series January 03, 2015  FINDINGS: Cross-table lateral lumbar image obtained. Metallic probe tips are posterior to the L5 spinous processes and superior aspect of the S1 vertebra body. There is marked disc space narrowing at all levels, most pronounced at L5-S1. Slight anterolisthesis of L4 on L5 persists. No other spondylolisthesis. No fracture.  IMPRESSION: Metallic probe tips are posterior to the L5 spinous process and superior aspect of the S1 vertebral body. Generalized osteoarthritic change. Mild spondylolisthesis at L4-5, stable.   Electronically Signed   By: Lowella Grip III M.D.   On: 02/15/2015 13:47    EKG: Orders placed or performed during the hospital encounter of 02/08/15  . EKG    . EKG     Hospital Course: Bryan [name] is a 69 y.o. who was admitted to [facility]. They were brought to the operating room on 02/15/2015 and underwent Procedure(s): COMPLETE DECOMPRESSIVE LUMBAR LAMINECTOMY L4-L5/ FORAMINOTOMY TO L4 NERVE ROOT AND L5 NERVE ROOT BILATERALLY.  Patient tolerated the procedure well and was later transferred to the recovery room and then to the orthopaedic floor for postoperative care.  They were given PO and IV analgesics for pain control following their surgery.  They were given 24 hours of postoperative antibiotics of  Anti-infectives    Start     Dose/Rate Route Frequency Ordered Stop   02/15/15 2000  ceFAZolin (ANCEF) IVPB 1 g/50 mL premix     1 g 100 mL/hr over 30 Minutes Intravenous Every 8 hours 02/15/15 1735 02/16/15 1258   02/15/15 1320  polymyxin B 500,000 Units, bacitracin 50,000 Units in sodium chloride irrigation 0.9 % 500 mL irrigation  Status:  Discontinued       As needed 02/15/15 1320 02/15/15 1514   02/15/15 1007  ceFAZolin (ANCEF) IVPB 2 g/50 mL premix     2 g 100 mL/hr over 30 Minutes Intravenous On call to O.R. 02/15/15 1007 02/15/15 1230     and started on DVT prophylaxis in the form of Aspirin.   PT and OT were ordered.  Discharge planning consulted to help with postop disposition and equipment needs.  Patient had a decent night on the evening of surgery.  They started to get up OOB with therapy on day one. Has some issues with ambulating post op day one. Dressing was changed on day two and the incision was clean and dry.  By day two, the patient had progressed with therapy and meeting their goals.  Incision was healing well.  Patient was seen in rounds and was ready to go home.   Diet: Cardiac diet Activity:WBAT Follow-up:in 2 weeks Disposition - Home Discharged Condition: stable   Discharge Instructions  Call MD / Call 911    Complete by:  As directed   If you experience chest pain or shortness of breath, CALL 911 and  be transported to the hospital emergency room.  If you develope a fever above 101 F, pus (white drainage) or increased drainage or redness at the wound, or calf pain, call your surgeon's office.     Constipation Prevention    Complete by:  As directed   Drink plenty of fluids.  Prune juice may be helpful.  You may use a stool softener, such as Colace (over the counter) 100 mg twice a day.  Use MiraLax (over the counter) for constipation as needed.     Diet - low sodium heart healthy    Complete by:  As directed      Discharge instructions    Complete by:  As directed   Change your dressing daily. Shower only, no tub bath. For the first few days, remove your dressing, tape a piece of saran wrap over your incision.  Take your shower, then remove the saran wrap and put a clean dressing on. After three days you can shower without the saran wrap.  Call if any temperatures greater than 101 or any wound complications: [phone] during the day and ask for Dr. Charlestine Night nurse, Brunilda Payor.     Driving restrictions    Complete by:  As directed   No driving     Increase activity slowly as tolerated    Complete by:  As directed      Lifting restrictions    Complete by:  As directed   No lifting            Medication List    STOP taking these medications        diazepam 5 MG tablet  Commonly known as:  VALIUM      TAKE these medications        acetaminophen 325 MG tablet  Commonly known as:  TYLENOL  Take 650 mg by mouth every 6 (six) hours as needed (Pain).     aspirin EC 81 MG tablet  Take 81 mg by mouth daily.     losartan-hydrochlorothiazide 50-12.5 MG per tablet  Commonly known as:  HYZAAR  Take 1 tablet by mouth daily.     meloxicam 15 MG tablet  Commonly known as:  MOBIC  Take 15 mg by mouth daily.     methocarbamol 500 MG tablet  Commonly known as:  ROBAXIN  Take 1 tablet (500 mg total) by mouth every 6 (six) hours as needed for muscle spasms.     nortriptyline 25  MG capsule  Commonly known as:  PAMELOR  Take 25 mg by mouth at bedtime.     oxyCODONE-acetaminophen 5-325 MG per tablet  Commonly known as:  PERCOCET/ROXICET  Take 1-2 tablets by mouth every 4 (four) hours as needed for moderate pain.     primidone 50 MG tablet  Commonly known as:  MYSOLINE  Take 50 mg by mouth 2 (two) times daily.     tamsulosin 0.4 MG Caps capsule  Commonly known as:  FLOMAX  Take 0.4 mg by mouth at bedtime.           Follow-up Information    Follow up with GIOFFRE,RONALD A, MD. Schedule an appointment as soon as possible for a visit on 03/01/2015.   Specialty:  Orthopedic Surgery   Why:  Please follow up with Dr. Gladstone Lighter on April 19th at [facility]  Contact information:   [address] [place] [address] [phone]       Signed: Ardeen Jourdain, PA-C Orthopaedic Surgery 02/21/2015, 6:59 AM

## 2015-02-21 NOTE — Anesthesia Postprocedure Evaluation (Signed)
Anesthesia Post Note  Patient: Bryan [name]  Procedure(s) Performed: Procedure(s) (LRB): COMPLETE DECOMPRESSIVE LUMBAR LAMINECTOMY L4-L5/ FORAMINOTOMY TO L4 NERVE ROOT AND L5 NERVE ROOT BILATERALLY (N/A)  Anesthesia type: general  Patient location: PACU  Post pain: Pain level controlled  Post assessment: Patient's Cardiovascular Status Stable  Last Vitals:  Filed Vitals:   02/17/15 0435  BP: 112/46  Pulse: 106  Temp: 38.1 C  Resp: 22    Post vital signs: Reviewed and stable  Level of consciousness: sedated  Complications: No apparent anesthesia complications

## 2015-03-08 DIAGNOSIS — M545 Low back pain: Secondary | ICD-10-CM | POA: Diagnosis not present

## 2015-03-16 DIAGNOSIS — M545 Low back pain: Secondary | ICD-10-CM | POA: Diagnosis not present

## 2015-04-01 DIAGNOSIS — M545 Low back pain: Secondary | ICD-10-CM | POA: Diagnosis not present

## 2015-04-05 DIAGNOSIS — M545 Low back pain: Secondary | ICD-10-CM | POA: Diagnosis not present

## 2015-05-09 ENCOUNTER — Encounter: Payer: Self-pay | Admitting: Gastroenterology

## 2015-06-22 ENCOUNTER — Ambulatory Visit (INDEPENDENT_AMBULATORY_CARE_PROVIDER_SITE_OTHER): Payer: Medicare Other

## 2015-06-22 ENCOUNTER — Ambulatory Visit (INDEPENDENT_AMBULATORY_CARE_PROVIDER_SITE_OTHER): Payer: Medicare Other | Admitting: Family Medicine

## 2015-06-22 VITALS — BP 135/80 | HR 81 | Temp 98.1°F | Resp 16

## 2015-06-22 DIAGNOSIS — R002 Palpitations: Secondary | ICD-10-CM

## 2015-06-22 DIAGNOSIS — R0602 Shortness of breath: Secondary | ICD-10-CM

## 2015-06-22 DIAGNOSIS — R5381 Other malaise: Secondary | ICD-10-CM

## 2015-06-22 DIAGNOSIS — R5383 Other fatigue: Secondary | ICD-10-CM

## 2015-06-22 LAB — BASIC METABOLIC PANEL
BUN: 17 mg/dL (ref 7–25)
CHLORIDE: 103 mmol/L (ref 98–110)
CO2: 28 mmol/L (ref 20–31)
Calcium: 9.1 mg/dL (ref 8.6–10.3)
Creat: 1.08 mg/dL (ref 0.70–1.18)
Glucose, Bld: 81 mg/dL (ref 65–99)
POTASSIUM: 4 mmol/L (ref 3.5–5.3)
SODIUM: 139 mmol/L (ref 135–146)

## 2015-06-22 LAB — TROPONIN I: Troponin I: 0.02 ng/mL (ref <0.06)

## 2015-06-22 NOTE — Progress Notes (Signed)
Subjective:    Patient ID: Bryan [name], male    DOB: [birth year], 69 y.o.   MRN: [id number]  HPI    Review of Systems     Objective:   Physical Exam        Assessment & Plan:   [facility] [address], [place] [address] [phone]  Date:  06/22/2015   Name:  Bryan [name]   DOB:  09-03-1946   MRN:  [id number]  PCP:  Lamar Blinks, MD    Chief Complaint: Anxiety; Insomnia; and Medication Reaction   History of Present Illness:  Bryan [name] is a 69 y.o. very pleasant male patient who presents with the following:  On CPAP but still during the day was having trouble with daytime somnelence, so was started on modafinil  Started medication on Tuesday/ yesterday taking half a pill Did not have any daytime somnelence with the pill but had trouble last night with inability to sleep.  "I only slept for 2 hours."   Feels like heart has been beating off beat, has decreased energy and doesn't want to do anything Feels much more short of breath and has sensation that he is gagging but does not have to throw up Denies any chest pain with the medication, denies tachycardia, or bradycardia Feels dizzy and nauseous when laying flat Also has shortness of breath laying flat (PND) Denies dysuria, abd pain, no hematuria, no contsipation or diarrhea, no vomiting No new upper back pain No passing out, no HA, no visual change, no confusion No cough/congestion   Patient Active Problem List   Diagnosis Date Noted  . Spinal stenosis, lumbar region, with neurogenic claudication 02/15/2015  . Acute cholecystitis with chronic cholecystitis s/p lap LOA/chole 12/07/2013 12/07/2013  . Obesity (BMI 30-39.9) 12/07/2013  . Refusal of blood transfusions as patient is Jehovah's Witness 12/07/2013  . Rheumatoid factor positive 10/26/2012  . Nonspecific abnormal finding in stool contents 07/28/2012  . Multiple joint pain 06/05/2012  . History of tobacco use-   06/05/2012  . Asthmatic bronchitis 12/15/2011  . BPH (benign prostatic hyperplasia) 12/15/2011  . Essential tremor 12/15/2011  . HTN (hypertension) 12/15/2011    Past Medical History  Diagnosis Date  . Obesity   . HTN (hypertension)   . BPH (benign prostatic hypertrophy)   . Seizures     AS A CHILD.  [name] Ulcer   . Hearing deficit     wears hearing aids bilateral  . GERD (gastroesophageal reflux disease)     reports for indigestion he uses mustard   . Arthritis     hands, knees, cervical area  . Essential tremor   . Sleep apnea     cpap - settings at 9 per patient     Past Surgical History  Procedure Laterality Date  . Tonsillectomy    . Eye surgery      right, growth excision  . Colonoscopy    . Vasectomy    . Anterior cervical decomp/discectomy fusion N/A 03/30/2013    Procedure: ANTERIOR CERVICAL DECOMPRESSION/DISCECTOMY FUSION 2 LEVELS;  Surgeon: Otilio Connors, MD;  Location: [facility];  Service: Neurosurgery;  Laterality: N/A;  C4-5 C5-6 Anterior cervical decompression/diskectomy/fusion/Allograft/Plate  . Spine surgery    . Wrist ganglion excision Left   . Cholecystectomy N/A 12/07/2013    Procedure: LAPAROSCOPIC CHOLECYSTECTOMY WITH INTRAOPERATIVE CHOLANGIOGRAM;  Surgeon: Adin Hector, MD;  Location: [facility];  Service: General;  Laterality: N/A;  . Decompressive lumbar  laminectomy level 2 N/A 02/15/2015    Procedure: COMPLETE DECOMPRESSIVE LUMBAR LAMINECTOMY L4-L5/ FORAMINOTOMY TO L4 NERVE ROOT AND L5 NERVE ROOT BILATERALLY;  Surgeon: [name], MD;  Location: [facility];  Service: Orthopedics;  Laterality: N/A;    Social History  Substance Use Topics  . Smoking status: Former Smoker    Types: Cigarettes    Quit date: 12/14/1972  . Smokeless tobacco: Never Used  . Alcohol Use: Yes     Comment: occasionally    Family History  Problem Relation Age of Onset  . Colon cancer Maternal Grandfather   . Diabetes Paternal Aunt     x 4 aunts  . Kidney disease  Brother   . Stroke Maternal Grandmother     Allergies  Allergen Reactions  . Other     BLOOD PRODUCT REFUSAL     Medication list has been reviewed and updated.  Current Outpatient Prescriptions on File Prior to Visit  Medication Sig Dispense Refill  . aspirin EC 81 MG tablet Take 81 mg by mouth daily.    . meloxicam (MOBIC) 15 MG tablet Take 15 mg by mouth daily.    . primidone (MYSOLINE) 50 MG tablet Take 50 mg by mouth 2 (two) times daily.     . Tamsulosin HCl (FLOMAX) 0.4 MG CAPS Take 0.4 mg by mouth at bedtime.      No current facility-administered medications on file prior to visit.    Review of Systems:  All other ROS negative except as seen in HPI  Physical Examination: Filed Vitals:   06/22/15 1131  BP: 153/79  Pulse: 81  Temp: 98.1 F (36.7 C)  Resp: 16   [name] There is no weight on file to calculate BMI. Ideal Body Weight:    GEN: WDWN, NAD, Non-toxic, A & O x 3, obese, looks well HEENT: Atraumatic, Normocephalic. Neck supple. No masses, No LAD. Ears and Nose: No external deformity. CV: RRR with occasional additional beat, No M/G/R. No JVD. No thrill. No extra heart sounds, no carotid bruit. PULM: CTAB, no wheezes, crackles, rhonchi. No retractions. No resp. distress. No accessory muscle use. ABD: S, NT, ND, +BS. No rebound. No HSM. EXTR: No c/c, trace pitting edema NEURO Normal gait.  PSYCH: Normally interactive. Conversant. Not depressed or anxious appearing.  Calm demeanor.   CXR:   [facility] PROVIDER INTERPRETATION: NO ACUTE CARDIOPULMONARY ABNORMALITY  CHEST 2 VIEW  COMPARISON: 02/08/2015  FINDINGS: Both lungs are clear. Heart and mediastinum are within normal limits. Trachea is midline. No acute bone abnormality.  IMPRESSION: No active cardiopulmonary disease.   EKG: right BBB, few PACs,  Compared to last EKG 01/2015- no acute change noted   Assessment and Plan: Palpitation - Plan: EKG 12-Lead, DG  Chest 2 View, Troponin I  Shortness of breath - Plan: DG Chest 2 View, Brain natriuretic peptide, Troponin I  Malaise and fatigue - Plan: Basic metabolic panel  Abnormal sensation in chest and shortness of breath likely associated with initiation of modafinil, but need to rule out cardiac origin.  No abnormality on exam and unchanged EKG with no signs of ischemic disease making this less likely Instructed pt to stop modafinil- he is happy to do so Will check CXR, stat troponin, BNP, and BMP CXR with no signs of pulmonary edema or cardiomegaly   Instructed to follow up with sleep medicine physician to adjust CPAP setting If nml chest xray will allow pt to return home but will call  with lab results with instruction to go to the hospital if any significant abnormalities If nml labs will have pt f/u with sleep medicine physician for alternative treatment plan  Signed Ben Adams-doolittle, DO  Called pt at 7:20 pm- troponin negative.  He is relieved, reports that he continues to feel better as the provigil gets out of his system  Results for orders placed or performed in visit on 06/22/15  Troponin I  Result Value Ref Range   Troponin I 0.02 <0.06 ng/mL

## 2015-06-22 NOTE — Patient Instructions (Signed)
Stop modafinil Make sure to answer phone when we call later with lab results, if severely abnormal might have to go to the emergency room Likely this is all coming from new medication but need to ensure this does not come from your heart Need to follow up with sleep medicine physician to adjust cpap settings

## 2015-06-23 LAB — BRAIN NATRIURETIC PEPTIDE: Brain Natriuretic Peptide: 36.2 pg/mL (ref 0.0–100.0)

## 2015-07-20 ENCOUNTER — Ambulatory Visit (INDEPENDENT_AMBULATORY_CARE_PROVIDER_SITE_OTHER): Payer: Medicare Other | Admitting: Family Medicine

## 2015-07-20 VITALS — BP 136/72 | HR 89 | Temp 98.4°F | Resp 18 | Ht 65.0 in | Wt 225.0 lb

## 2015-07-20 DIAGNOSIS — Z79899 Other long term (current) drug therapy: Secondary | ICD-10-CM

## 2015-07-20 DIAGNOSIS — M2550 Pain in unspecified joint: Secondary | ICD-10-CM

## 2015-07-20 DIAGNOSIS — M1711 Unilateral primary osteoarthritis, right knee: Secondary | ICD-10-CM | POA: Diagnosis not present

## 2015-07-20 DIAGNOSIS — M5010 Cervical disc disorder with radiculopathy, unspecified cervical region: Secondary | ICD-10-CM | POA: Diagnosis not present

## 2015-07-20 DIAGNOSIS — M5030 Other cervical disc degeneration, unspecified cervical region: Secondary | ICD-10-CM | POA: Diagnosis not present

## 2015-07-20 DIAGNOSIS — Z23 Encounter for immunization: Secondary | ICD-10-CM | POA: Diagnosis not present

## 2015-07-20 DIAGNOSIS — M7581 Other shoulder lesions, right shoulder: Secondary | ICD-10-CM

## 2015-07-20 DIAGNOSIS — G894 Chronic pain syndrome: Secondary | ICD-10-CM

## 2015-07-20 MED ORDER — HYDROCODONE-ACETAMINOPHEN 5-325 MG PO TABS: 1.0000 | ORAL_TABLET | Freq: Every evening | ORAL | Status: DC | PRN

## 2015-07-20 MED ORDER — TRIAMCINOLONE ACETONIDE 40 MG/ML IJ SUSP
40.0000 mg | Freq: Once | INTRAMUSCULAR | Status: AC
  Administered 2015-07-20: 40 mg via INTRA_ARTICULAR

## 2015-07-20 MED ORDER — DICLOFENAC SODIUM 75 MG PO TBEC: 75.0000 mg | DELAYED_RELEASE_TABLET | Freq: Two times a day (BID) | ORAL | Status: DC

## 2015-07-20 MED ORDER — PREDNISONE 10 MG PO TABS: ORAL_TABLET | ORAL | Status: DC

## 2015-07-20 NOTE — Progress Notes (Signed)
Subjective:    Patient ID: Bryan [name], male    DOB: 1946-01-09, 69 y.o.   MRN: [id number] This chart was scribed for Delman Cheadle, MD by Zola Button, Medical Scribe. This patient was seen in Room 11 and the patient's care was started at 9:49 AM.   Chief Complaint  Patient presents with  . Shoulder Pain    rt x2 mths  . Knee Pain    rt x2 mths   . Numbness    rt hand   . Flu Vaccine    HPI HPI Comments: Bryan [name] is a 69 y.o. male with a history of arthritis who presents to the [facility] complaining of right knee pain that started 1-2 months ago. Patient also reports having right shoulder pain radiating from his neck that also started about 1-2 months ago; he believes his shoulder pain is related to his neck. He has restarted methocarbamol yesterday which he states has not been providing relief. He has also been taking meloxicam which has not been providing relief. He has not tried Tylenol or glucosamine chondroitin. He has been on courses of prednisone in the past which has worked well. He has had cortisone injections in his left knee previously which did provide relief, but he has not had injections in his right knee. He has had difficulty sleeping due to the pain.  Patient also reports having some numbness to his 4th and 5th fingers bilaterally at night.  His orthopedist, Dr. Gladstone Lighter, did a lumbar L4-L5 laminectomy 5 months ago. He did have PT with [facility]; he did have a good experience with them. He had right shoulder and right knee XR imaging 2 years ago, showing arthritis. Also has a history of cervical DDD and arthritic change.  Past Medical History  Diagnosis Date  . Obesity   . HTN (hypertension)   . BPH (benign prostatic hypertrophy)   . Seizures     AS A CHILD.  [name] Ulcer   . Hearing deficit     wears hearing aids bilateral  . GERD (gastroesophageal reflux disease)     reports for indigestion he uses mustard   . Arthritis    hands, knees, cervical area  . Essential tremor   . Sleep apnea     cpap - settings at 9 per patient    Current Outpatient Prescriptions on File Prior to Visit  Medication Sig Dispense Refill  . aspirin EC 81 MG tablet Take 81 mg by mouth daily.    . modafinil (PROVIGIL) 200 MG tablet Take 200 mg by mouth daily.    . primidone (MYSOLINE) 50 MG tablet Take 50 mg by mouth 2 (two) times daily.     . Tamsulosin HCl (FLOMAX) 0.4 MG CAPS Take 0.4 mg by mouth at bedtime.      No current facility-administered medications on file prior to visit.   Allergies  Allergen Reactions  . Other     BLOOD PRODUCT REFUSAL     Review of Systems  Constitutional: Positive for activity change. Negative for fever, chills, appetite change and unexpected weight change.  Musculoskeletal: Positive for myalgias, back pain, joint swelling, arthralgias, gait problem, neck pain and neck stiffness.  Neurological: Positive for numbness. Negative for weakness.  Psychiatric/Behavioral: Positive for sleep disturbance.       Objective:  BP 136/72 mmHg  Pulse 89  Temp(Src) 98.4 F (36.9 C) (Oral)  Resp 18  Ht 5\' 5"  (1.651 m)  Wt 225  lb (102.059 kg)  BMI 37.44 kg/m2  SpO2 97%  Physical Exam  Constitutional: He is oriented to person, place, and time. He appears well-developed and well-nourished. No distress.  HENT:  Head: Normocephalic and atraumatic.  Mouth/Throat: Oropharynx is clear and moist. No oropharyngeal exudate.  Eyes: Pupils are equal, round, and reactive to light.  Neck: Neck supple.  No point tenderness over cervical spine or paraspinal. Moderately to severely reduced cervical ROM in all directions, flexion, extension, lateral rotation and lateroflexion.   Cardiovascular: Normal rate.   Pulmonary/Chest: Effort normal.  Musculoskeletal: He exhibits tenderness. He exhibits no edema.  No TTP over clavicles or acromion. Holding the right shoulder anterior to left shoulder. No TTP over corticoid  process or AC joint. Holding the right shoulder 2-3 cm lower than left. Mild TTP to supraspinatus on the right; negative Spurling's. Shoulders with great ROM, but with severe pain with abduction above 90 degrees. Very decreased ROM in trapezius and rhomboids bilaterally.  Right knee: Full ROM. Severe crepitus. No joint line tenderness, instability, or effusion.  Neurological: He is alert and oriented to person, place, and time. No cranial nerve deficit.  Reflex Scores:      Tricep reflexes are 2+ on the right side and 2+ on the left side.      Bicep reflexes are 2+ on the right side and 2+ on the left side.      Brachioradialis reflexes are 2+ on the right side and 2+ on the left side. Grasp strength in opposition 5/5 bilaterally.  Skin: Skin is warm and dry. No rash noted.  Psychiatric: He has a normal mood and affect. His behavior is normal.  Nursing note and vitals reviewed.  Risks/benefits of intraarticular injection reviewed and informed consent obtained.  Knee positioned at 10 deg flexion, cleaned with betadine x 2.  Anesthesia w/ ethyl chloride cold spray. By superior lateral approach to suprapatellar bursa, injected with 40mg  of Kenalog and 4cc of 1% lidocaine using 20g 1 1/2in needle without complications. Pt tolerated procedure well. No EBL.      Assessment & Plan:  Permanent handicap placard completed.  1. Needs flu shot   2. Primary osteoarthritis of right knee   3. Degeneration of cervical intervertebral disc   4. Cervical disc disorder with radiculopathy of cervical region   5. Polypharmacy   6. Chronic pain syndrome   7. Rotator cuff tendonitis, right   8. Multiple joint pain     Orders Placed This Encounter  Procedures  . Flu Vaccine QUAD 36+ mos IM  . Ambulatory referral to Orthopedic Surgery    Referral Priority:  Routine    Referral Type:  Surgical    Referral Reason:  Specialty Services Required    Referred to Provider:  [name], MD    Requested  Specialty:  Orthopedic Surgery    Number of Visits Requested:  1    Meds ordered this encounter  Medications  . triamcinolone acetonide (KENALOG-40) injection 40 mg    Sig:     Dr. Andrey Campanile administered into Right knee  . predniSONE (DELTASONE) 10 MG tablet    Sig: 6-5-4-3-2-1 tabs po qd    Dispense:  21 tablet    Refill:  0  . diclofenac (VOLTAREN) 75 MG EC tablet    Sig: Take 1 tablet (75 mg total) by mouth 2 (two) times daily.    Dispense:  60 tablet    Refill:  1    Start after prednisone is complete  .  HYDROcodone-acetaminophen (NORCO/VICODIN) 5-325 MG per tablet    Sig: Take 1 tablet by mouth at bedtime as needed for moderate pain.    Dispense:  20 tablet    Refill:  0    I personally performed the services described in this documentation, which was scribed in my presence. The recorded information has been reviewed and considered, and addended by me as needed.  Delman Cheadle, MD MPH

## 2015-07-20 NOTE — Patient Instructions (Addendum)
Start tylenol (acetaminophen) arthritis 1-2 tabs 2-3times a day.  Start glucosamine-condroiton high dose (>1500mg  of each) every day.  Start oral prednisone taper and after the course is complete in 6 days, start the diclofenac every day.  If you stop one of the medicines, stop the diclofenac as it has the most side effects - can be hard on your stomach and heart.   You can try to take the methocarbamol at night to decrease morning stiffness. If you have had a long day with more pain, then you can try a hydrocodone before bed. Lets get you back to Dr. Rushie Nyhan as I am concerned your numbness in your fingers is coming from the discs in your neck pinching on nerves  Joint Injection Care After Refer to this sheet in the next few days. These instructions provide you with information on caring for yourself after you have had a joint injection. Your caregiver also may give you more specific instructions. Your treatment has been planned according to current medical practices, but problems sometimes occur. Call your caregiver if you have any problems or questions after your procedure. After any type of joint injection, it is not uncommon to experience:  Soreness, swelling, or bruising around the injection site.  Mild numbness, tingling, or weakness around the injection site caused by the numbing medicine used before or with the injection. It also is possible to experience the following effects associated with the specific agent after injection:  Iodine-based contrast agents:  Allergic reaction (itching, hives, widespread redness, and swelling beyond the injection site).  Corticosteroids (These effects are rare.):  Allergic reaction.  Increased blood sugar levels (If you have diabetes and you notice that your blood sugar levels have increased, notify your caregiver).  Increased blood pressure levels.  Mood swings.  Hyaluronic acid in the use of viscosupplementation.  Temporary heat or  redness.  Temporary rash and itching.  Increased fluid accumulation in the injected joint. These effects all should resolve within a day after your procedure.  HOME CARE INSTRUCTIONS  Limit yourself to light activity the day of your procedure. Avoid lifting heavy objects, bending, stooping, or twisting.  Take prescription or over-the-counter pain medication as directed by your caregiver.  You may apply ice to your injection site to reduce pain and swelling the day of your procedure. Ice may be applied 03-04 times:  Put ice in a plastic bag.  Place a towel between your skin and the bag.  Leave the ice on for no longer than 15-20 minutes each time. SEEK IMMEDIATE MEDICAL CARE IF:   Pain and swelling get worse rather than better or extend beyond the injection site.  Numbness does not go away.  Blood or fluid continues to leak from the injection site.  You have chest pain.  You have swelling of your face or tongue.  You have trouble breathing or you become dizzy.  You develop a fever, chills, or severe tenderness at the injection site that last longer than 1 day. MAKE SURE YOU:  Understand these instructions.  Watch your condition.  Get help right away if you are not doing well or if you get worse. Document Released: 07/12/2011 Document Revised: 01/21/2012 Document Reviewed: 07/12/2011 [facility] Patient Information 2015 [place], [place]. This information is not intended to replace advice given to you by your health care provider. Make sure you discuss any questions you have with your health care provider. Cervical Radiculopathy Cervical radiculopathy happens when a nerve in the neck is pinched or bruised  by a slipped (herniated) disk or by arthritic changes in the bones of the cervical spine. This can occur due to an injury or as part of the normal aging process. Pressure on the cervical nerves can cause pain or numbness that runs from your neck all the way down into your arm  and fingers. CAUSES  There are many possible causes, including:  Injury.  Muscle tightness in the neck from overuse.  Swollen, painful joints (arthritis).  Breakdown or degeneration in the bones and joints of the spine (spondylosis) due to aging.  Bone spurs that may develop near the cervical nerves. SYMPTOMS  Symptoms include pain, weakness, or numbness in the affected arm and hand. Pain can be severe or irritating. Symptoms may be worse when extending or turning the neck. DIAGNOSIS  Your caregiver will ask about your symptoms and do a physical exam. He or she may test your strength and reflexes. X-rays, CT scans, and MRI scans may be needed in cases of injury or if the symptoms do not go away after a period of time. Electromyography (EMG) or nerve conduction testing may be done to study how your nerves and muscles are working. TREATMENT  Your caregiver may recommend certain exercises to help relieve your symptoms. Cervical radiculopathy can, and often does, get better with time and treatment. If your problems continue, treatment options may include:  Wearing a soft collar for short periods of time.  Physical therapy to strengthen the neck muscles.  Medicines, such as nonsteroidal anti-inflammatory drugs (NSAIDs), oral corticosteroids, or spinal injections.  Surgery. Different types of surgery may be done depending on the cause of your problems. HOME CARE INSTRUCTIONS   Put ice on the affected area.  Put ice in a plastic bag.  Place a towel between your skin and the bag.  Leave the ice on for 15-20 minutes, 03-04 times a day or as directed by your caregiver.  If ice does not help, you can try using heat. Take a warm shower or bath, or use a hot water bottle as directed by your caregiver.  You may try a gentle neck and shoulder massage.  Use a flat pillow when you sleep.  Only take over-the-counter or prescription medicines for pain, discomfort, or fever as directed by your  caregiver.  If physical therapy was prescribed, follow your caregiver's directions.  If a soft collar was prescribed, use it as directed. SEEK IMMEDIATE MEDICAL CARE IF:   Your pain gets much worse and cannot be controlled with medicines.  You have weakness or numbness in your hand, arm, face, or leg.  You have a high fever or a stiff, rigid neck.  You lose bowel or bladder control (incontinence).  You have trouble with walking, balance, or speaking. MAKE SURE YOU:   Understand these instructions.  Will watch your condition.  Will get help right away if you are not doing well or get worse. Document Released: 07/24/2001 Document Revised: 01/21/2012 Document Reviewed: 06/12/2011 [facility] Patient Information 2015 [place], [place]. This information is not intended to replace advice given to you by your health care provider. Make sure you discuss any questions you have with your health care provider.  Influenza Virus Vaccine injection (Fluarix) What is this medicine? INFLUENZA VIRUS VACCINE (in floo EN zuh VAHY ruhs vak SEEN) helps to reduce the risk of getting influenza also known as the flu. This medicine may be used for other purposes; ask your health care provider or pharmacist if you have questions. COMMON BRAND NAME(S):  Fluarix, Fluzone What should I tell my health care provider before I take this medicine? They need to know if you have any of these conditions: -bleeding disorder like hemophilia -fever or infection -Guillain-Barre syndrome or other neurological problems -immune system problems -infection with the human immunodeficiency virus (HIV) or AIDS -low blood platelet counts -multiple sclerosis -an unusual or allergic reaction to influenza virus vaccine, eggs, chicken proteins, latex, gentamicin, other medicines, foods, dyes or preservatives -pregnant or trying to get pregnant -breast-feeding How should I use this medicine? This vaccine is for injection into a  muscle. It is given by a health care professional. A copy of Vaccine Information Statements will be given before each vaccination. Read this sheet carefully each time. The sheet may change frequently. Talk to your pediatrician regarding the use of this medicine in children. Special care may be needed. Overdosage: If you think you have taken too much of this medicine contact a poison control center or emergency room at once. NOTE: This medicine is only for you. Do not share this medicine with others. What if I miss a dose? This does not apply. What may interact with this medicine? -chemotherapy or radiation therapy -medicines that lower your immune system like etanercept, anakinra, infliximab, and adalimumab -medicines that treat or prevent blood clots like warfarin -phenytoin -steroid medicines like prednisone or cortisone -theophylline -vaccines This list may not describe all possible interactions. Give your health care provider a list of all the medicines, herbs, non-prescription drugs, or dietary supplements you use. Also tell them if you smoke, drink alcohol, or use illegal drugs. Some items may interact with your medicine. What should I watch for while using this medicine? Report any side effects that do not go away within 3 days to your doctor or health care professional. Call your health care provider if any unusual symptoms occur within 6 weeks of receiving this vaccine. You may still catch the flu, but the illness is not usually as bad. You cannot get the flu from the vaccine. The vaccine will not protect against colds or other illnesses that may cause fever. The vaccine is needed every year. What side effects may I notice from receiving this medicine? Side effects that you should report to your doctor or health care professional as soon as possible: -allergic reactions like skin rash, itching or hives, swelling of the face, lips, or tongue Side effects that usually do not require  medical attention (report to your doctor or health care professional if they continue or are bothersome): -fever -headache -muscle aches and pains -pain, tenderness, redness, or swelling at site where injected -weak or tired This list may not describe all possible side effects. Call your doctor for medical advice about side effects. You may report side effects to FDA at 1-800-FDA-1088. Where should I keep my medicine? This vaccine is only given in a clinic, pharmacy, doctor's office, or other health care setting and will not be stored at home. NOTE: This sheet is a summary. It may not cover all possible information. If you have questions about this medicine, talk to your doctor, pharmacist, or health care provider.  2015, [employer]. (2008-05-26 09:30:40)

## 2015-08-09 DIAGNOSIS — M25511 Pain in right shoulder: Secondary | ICD-10-CM | POA: Diagnosis not present

## 2015-09-05 ENCOUNTER — Emergency Department (HOSPITAL_COMMUNITY)
Admission: EM | Admit: 2015-09-05 | Discharge: 2015-09-05 | Disposition: A | Discharge status: Home / Self Care | Payer: Medicare Other | Attending: Emergency Medicine | Admitting: Emergency Medicine

## 2015-09-05 ENCOUNTER — Emergency Department (HOSPITAL_COMMUNITY): Payer: Medicare Other

## 2015-09-05 ENCOUNTER — Ambulatory Visit (INDEPENDENT_AMBULATORY_CARE_PROVIDER_SITE_OTHER): Payer: Medicare Other | Admitting: Family Medicine

## 2015-09-05 ENCOUNTER — Encounter (HOSPITAL_COMMUNITY): Payer: Self-pay | Admitting: Emergency Medicine

## 2015-09-05 VITALS — BP 116/82 | HR 73 | Temp 98.4°F | Resp 18 | Ht 64.0 in | Wt 221.0 lb

## 2015-09-05 DIAGNOSIS — E669 Obesity, unspecified: Secondary | ICD-10-CM | POA: Insufficient documentation

## 2015-09-05 DIAGNOSIS — Z9981 Dependence on supplemental oxygen: Secondary | ICD-10-CM | POA: Diagnosis not present

## 2015-09-05 DIAGNOSIS — M542 Cervicalgia: Secondary | ICD-10-CM

## 2015-09-05 DIAGNOSIS — Z79899 Other long term (current) drug therapy: Secondary | ICD-10-CM | POA: Insufficient documentation

## 2015-09-05 DIAGNOSIS — I10 Essential (primary) hypertension: Secondary | ICD-10-CM | POA: Insufficient documentation

## 2015-09-05 DIAGNOSIS — Y9289 Other specified places as the place of occurrence of the external cause: Secondary | ICD-10-CM | POA: Insufficient documentation

## 2015-09-05 DIAGNOSIS — G8929 Other chronic pain: Secondary | ICD-10-CM | POA: Insufficient documentation

## 2015-09-05 DIAGNOSIS — Z7982 Long term (current) use of aspirin: Secondary | ICD-10-CM | POA: Diagnosis not present

## 2015-09-05 DIAGNOSIS — H9190 Unspecified hearing loss, unspecified ear: Secondary | ICD-10-CM | POA: Diagnosis not present

## 2015-09-05 DIAGNOSIS — R110 Nausea: Secondary | ICD-10-CM

## 2015-09-05 DIAGNOSIS — M7989 Other specified soft tissue disorders: Secondary | ICD-10-CM

## 2015-09-05 DIAGNOSIS — S161XXA Strain of muscle, fascia and tendon at neck level, initial encounter: Secondary | ICD-10-CM | POA: Diagnosis not present

## 2015-09-05 DIAGNOSIS — N400 Enlarged prostate without lower urinary tract symptoms: Secondary | ICD-10-CM | POA: Insufficient documentation

## 2015-09-05 DIAGNOSIS — Z87891 Personal history of nicotine dependence: Secondary | ICD-10-CM | POA: Diagnosis not present

## 2015-09-05 DIAGNOSIS — Z791 Long term (current) use of non-steroidal anti-inflammatories (NSAID): Secondary | ICD-10-CM | POA: Diagnosis not present

## 2015-09-05 DIAGNOSIS — S199XXA Unspecified injury of neck, initial encounter: Secondary | ICD-10-CM | POA: Diagnosis present

## 2015-09-05 DIAGNOSIS — X58XXXA Exposure to other specified factors, initial encounter: Secondary | ICD-10-CM | POA: Diagnosis not present

## 2015-09-05 DIAGNOSIS — Z8719 Personal history of other diseases of the digestive system: Secondary | ICD-10-CM | POA: Insufficient documentation

## 2015-09-05 DIAGNOSIS — G4730 Sleep apnea, unspecified: Secondary | ICD-10-CM | POA: Insufficient documentation

## 2015-09-05 DIAGNOSIS — M1990 Unspecified osteoarthritis, unspecified site: Secondary | ICD-10-CM | POA: Insufficient documentation

## 2015-09-05 DIAGNOSIS — Y93H1 Activity, digging, shoveling and raking: Secondary | ICD-10-CM | POA: Insufficient documentation

## 2015-09-05 DIAGNOSIS — S4991XA Unspecified injury of right shoulder and upper arm, initial encounter: Secondary | ICD-10-CM | POA: Insufficient documentation

## 2015-09-05 DIAGNOSIS — M79601 Pain in right arm: Secondary | ICD-10-CM

## 2015-09-05 DIAGNOSIS — T148 Other injury of unspecified body region: Secondary | ICD-10-CM | POA: Insufficient documentation

## 2015-09-05 DIAGNOSIS — Y998 Other external cause status: Secondary | ICD-10-CM | POA: Insufficient documentation

## 2015-09-05 DIAGNOSIS — T148XXA Other injury of unspecified body region, initial encounter: Secondary | ICD-10-CM

## 2015-09-05 LAB — SEDIMENTATION RATE: SED RATE: 64 mm/h — AB (ref 0–16)

## 2015-09-05 LAB — CBC WITH DIFFERENTIAL/PLATELET
Basophils Absolute: 0 10*3/uL (ref 0.0–0.1)
Basophils Relative: 0 %
EOS PCT: 2 %
Eosinophils Absolute: 0.1 10*3/uL (ref 0.0–0.7)
HEMATOCRIT: 39 % (ref 39.0–52.0)
Hemoglobin: 12.6 g/dL — ABNORMAL LOW (ref 13.0–17.0)
LYMPHS PCT: 21 %
Lymphs Abs: 1.5 10*3/uL (ref 0.7–4.0)
MCH: 29.8 pg (ref 26.0–34.0)
MCHC: 32.3 g/dL (ref 30.0–36.0)
MCV: 92.2 fL (ref 78.0–100.0)
MONO ABS: 0.4 10*3/uL (ref 0.1–1.0)
Monocytes Relative: 6 %
Neutro Abs: 4.8 10*3/uL (ref 1.7–7.7)
Neutrophils Relative %: 71 %
PLATELETS: 294 10*3/uL (ref 150–400)
RBC: 4.23 MIL/uL (ref 4.22–5.81)
RDW: 15.2 % (ref 11.5–15.5)
WBC: 6.8 10*3/uL (ref 4.0–10.5)

## 2015-09-05 LAB — BASIC METABOLIC PANEL
Anion gap: 9 (ref 5–15)
BUN: 17 mg/dL (ref 6–20)
CALCIUM: 9.1 mg/dL (ref 8.9–10.3)
CO2: 27 mmol/L (ref 22–32)
Chloride: 100 mmol/L — ABNORMAL LOW (ref 101–111)
Creatinine, Ser: 0.95 mg/dL (ref 0.61–1.24)
GFR calc Af Amer: >60 mL/min (ref >60)
GLUCOSE: 89 mg/dL (ref 65–99)
POTASSIUM: 4.2 mmol/L (ref 3.5–5.1)
Sodium: 136 mmol/L (ref 135–145)

## 2015-09-05 MED ORDER — HYDROCODONE-ACETAMINOPHEN 5-325 MG PO TABS: 1.0000 | ORAL_TABLET | ORAL | Status: DC | PRN

## 2015-09-05 MED ORDER — ONDANSETRON 4 MG PO TBDP
4.0000 mg | ORAL_TABLET | Freq: Once | ORAL | Status: AC
  Administered 2015-09-05: 4 mg via ORAL

## 2015-09-05 MED ORDER — PREDNISONE 10 MG PO TABS: ORAL_TABLET | ORAL | Status: DC

## 2015-09-05 MED ORDER — HYDROCODONE-ACETAMINOPHEN 5-325 MG PO TABS
1.0000 | ORAL_TABLET | Freq: Once | ORAL | Status: AC
  Administered 2015-09-05: 1 via ORAL
  Filled 2015-09-05: qty 1

## 2015-09-05 NOTE — Progress Notes (Addendum)
Patient ID: Bryan [name], male   DOB: 05/22/1946, 69 y.o.   MRN: [id number] Pt returned to clinic this evening with his wife - she was being seen for an acute bronchitis by myself. Pt reports to me during his wife's visit that he was seen here earlier today for a pinched nerve in his right neck that was radiating down right arm and his hand was cold and swollen this morning. He saw his PCP Dr. Lorelei Pont who had him go to the ER for furhter eval as could not r/o poss emergent etiologies such as thoracic outlet or thombosis. Pt reports in the ER he was told his labs were nml and he had a CT c-spine which showed mod multilevel DDD and prior c4-6 surg fusion. Pt reports he was started on prednisone 64m 6d pater and hydrocodone for a pinched cervical nerve both of which he did take today. Throughout the day he has been feeling progressively worse and during his wife's visit he begins to c/o diaphoresis, clammy, shakiness, and nausea as well as persistent severe radicular c-spine pain.  Pt appeared clinically well other than he was clammy. No rash or skin changes, VS nml. Gave zofran 435mSL x 1 in office with partial improvement. ER notes reviewed - appears pt had a nml cbc and bmp but his ESR was 64 - last was 22 3 yrs prior. Upon further chart review, it appears that around 2012 - 2013 pt had a + lab w/u for autoimmune arthritis with + RF, + ANA, elev ESR, and Raynauds sxs - pt was referred to Dr. DeEstanislado Pandyut pt denies remembering this, [facility].  It looks like the lab testing was done pre-Epic so I have not seen the actual results of this w/u or any further trx/eval that was done at that time. Pt advised that he should RTC in 1-2d for repeat eval and review of paper chart, consider repeating referral and/or rheum labs (though now pt is on prednisone taper).  Advised pt that with his current esr that could be chronic or a marker of impending  illness, could still be in the process of acute presentation (? Shingles vs acute herniation vs rheum flair, etc) so RTC  immed if sxs progress or new sxs develop.

## 2015-09-05 NOTE — ED Notes (Signed)
Per pt, states right neck pain radiating dwon to right arm and hand-states decreased use of right hand due to pain

## 2015-09-05 NOTE — ED Provider Notes (Signed)
  Face-to-face evaluation   History: He was sent here from his doctor's office for evaluation of swelling in the right hand, and neck pain. Patient states that he has had neck pain for several days which is worse with palpation and movement of the neck. He also has pain in the right hand which started today. He has chronic arthritis and does not feel his hand or arm. Are any more swollen, than usual.  Physical exam: Alert, elderly man who is uncomfortable. Neck has diminished flexion secondary to pain. Extremities normal range of motion, arms, elbows and wrists bilaterally. No significant edema of the right arm or hand. Mild arthropathy of the carpal phalangeal joints of the right hand.  Medical screening examination/treatment/procedure(s) were conducted as a shared visit with non-physician practitioner(s) and myself.  I personally evaluated the patient during the encounter  Daleen Bo, MD 09/06/15 0900

## 2015-09-05 NOTE — ED Notes (Signed)
Patient transported to CT 

## 2015-09-05 NOTE — Discharge Instructions (Signed)
Read the information below.  Use the prescribed medication as directed.  Please discuss all new medications with your pharmacist.  Do not take additional tylenol while taking the prescribed pain medication to avoid overdose.  You may return to the Emergency Department at any time for worsening condition or any new symptoms that concern you.   If you develop uncontrolled pain, weakness or numbness of the extremity, severe discoloration of the skin, or you are unable to move your arm, return to the ER for a recheck.      Musculoskeletal Pain Musculoskeletal pain is muscle and boney aches and pains. These pains can occur in any part of the body. Your caregiver may treat you without knowing the cause of the pain. They may treat you if blood or urine tests, X-rays, and other tests were normal.  CAUSES There is often not a definite cause or reason for these pains. These pains may be caused by a type of germ (virus). The discomfort may also come from overuse. Overuse includes working out too hard when your body is not fit. Boney aches also come from weather changes. Bone is sensitive to atmospheric pressure changes. HOME CARE INSTRUCTIONS   Ask when your test results will be ready. Make sure you get your test results.  Only take over-the-counter or prescription medicines for pain, discomfort, or fever as directed by your caregiver. If you were given medications for your condition, do not drive, operate machinery or power tools, or sign legal documents for 24 hours. Do not drink alcohol. Do not take sleeping pills or other medications that may interfere with treatment.  Continue all activities unless the activities cause more pain. When the pain lessens, slowly resume normal activities. Gradually increase the intensity and duration of the activities or exercise.  During periods of severe pain, bed rest may be helpful. Lay or sit in any position that is comfortable.  Putting ice on the injured area.  Put  ice in a bag.  Place a towel between your skin and the bag.  Leave the ice on for 15 to 20 minutes, 3 to 4 times a day.  Follow up with your caregiver for continued problems and no reason can be found for the pain. If the pain becomes worse or does not go away, it may be necessary to repeat tests or do additional testing. Your caregiver may need to look further for a possible cause. SEEK IMMEDIATE MEDICAL CARE IF:  You have pain that is getting worse and is not relieved by medications.  You develop chest pain that is associated with shortness or breath, sweating, feeling sick to your stomach (nauseous), or throw up (vomit).  Your pain becomes localized to the abdomen.  You develop any new symptoms that seem different or that concern you. MAKE SURE YOU:   Understand these instructions.  Will watch your condition.  Will get help right away if you are not doing well or get worse.   This information is not intended to replace advice given to you by your health care provider. Make sure you discuss any questions you have with your health care provider.   Document Released: 10/29/2005 Document Revised: 01/21/2012 Document Reviewed: 07/03/2013 Elsevier Interactive Patient Education 2016 [employer]  Radicular Pain Radicular pain in either the arm or leg is usually from a bulging or herniated disk in the spine. A piece of the herniated disk may press against the nerves as the nerves exit the spine. This causes pain which is  felt at the tips of the nerves down the arm or leg. Other causes of radicular pain may include:  Fractures.  Heart disease.  Cancer.  An abnormal and usually degenerative state of the nervous system or nerves (neuropathy). Diagnosis may require CT or MRI scanning to determine the primary cause.  Nerves that start at the neck (nerve roots) may cause radicular pain in the outer shoulder and arm. It can spread down to the thumb and fingers. The symptoms vary  depending on which nerve root has been affected. In most cases radicular pain improves with conservative treatment. Neck problems may require physical therapy, a neck collar, or cervical traction. Treatment may take many weeks, and surgery may be considered if the symptoms do not improve.  Conservative treatment is also recommended for sciatica. Sciatica causes pain to radiate from the lower back or buttock area down the leg into the foot. Often there is a history of back problems. Most patients with sciatica are better after 2 to 4 weeks of rest and other supportive care. Short term bed rest can reduce the disk pressure considerably. Sitting, however, is not a good position since this increases the pressure on the disk. You should avoid bending, lifting, and all other activities which make the problem worse. Traction can be used in severe cases. Surgery is usually reserved for patients who do not improve within the first months of treatment. Only take over-the-counter or prescription medicines for pain, discomfort, or fever as directed by your caregiver. Narcotics and muscle relaxants may help by relieving more severe pain and spasm and by providing mild sedation. Cold or massage can give significant relief. Spinal manipulation is not recommended. It can increase the degree of disc protrusion. Epidural steroid injections are often effective treatment for radicular pain. These injections deliver medicine to the spinal nerve in the space between the protective covering of the spinal cord and back bones (vertebrae). Your caregiver can give you more information about steroid injections. These injections are most effective when given within two weeks of the onset of pain.  You should see your caregiver for follow up care as recommended. A program for neck and back injury rehabilitation with stretching and strengthening exercises is an important part of management.  SEEK IMMEDIATE MEDICAL CARE IF:  You develop  increased pain, weakness, or numbness in your arm or leg.  You develop difficulty with bladder or bowel control.  You develop abdominal pain.   This information is not intended to replace advice given to you by your health care provider. Make sure you discuss any questions you have with your health care provider.   Document Released: 12/06/2004 Document Revised: 11/19/2014 Document Reviewed: 05/25/2015 Elsevier Interactive Patient Education [employer].

## 2015-09-05 NOTE — Addendum Note (Signed)
Addended by: Delman Cheadle on: 09/05/2015 09:35 PM   Modules accepted: Orders

## 2015-09-05 NOTE — ED Provider Notes (Signed)
CSN: [id number]     Arrival date & time 09/05/15  1010 History   First MD Initiated Contact with Patient 09/05/15 1103     Chief Complaint  Patient presents with  . Neck Pain     (Consider location/radiation/quality/duration/timing/severity/associated sxs/prior Treatment) The history is provided by the patient.     Pt with hx spinal stenosis, arthritis, s/p cervical discectomy and fusion 2014 (Dr Luiz Ochoa) sent from his PCP today out of concern for right neck and upper extremity pain.  Pt notes he has had pain for the past 2-3 days, starting in the right posterior head and radiating down his right neck and into his right arm.  Has increased pain with attempting to move his neck, and with movement of his right wrist and fingers.  Has taken robaxin without significant improvement.  States he was shoveling a lot and lifting heavy timber the day before the pain began.  Has had chronic joint problems including pain in his neck and shoulder, also in knees - sees Dr Gladstone Lighter for this.  When he was last seen he had just done a steroid taper pack, which completely relieved his chronic neck pain.  States this pain is similar. Denies fevers, trauma or recent falls.  Denies CP, SOB.  Denies weakness or numbness/tingling of the extremities.    Past Medical History  Diagnosis Date  . Obesity   . HTN (hypertension)   . BPH (benign prostatic hypertrophy)   . Seizures (HCC)     AS A CHILD.  [name] Ulcer   . Hearing deficit     wears hearing aids bilateral  . GERD (gastroesophageal reflux disease)     reports for indigestion he uses mustard   . Arthritis     hands, knees, cervical area  . Essential tremor   . Sleep apnea     cpap - settings at 9 per patient    Past Surgical History  Procedure Laterality Date  . Tonsillectomy    . Eye surgery      right, growth excision  . Colonoscopy    . Vasectomy    . Anterior cervical decomp/discectomy fusion N/A 03/30/2013    Procedure: ANTERIOR CERVICAL  DECOMPRESSION/DISCECTOMY FUSION 2 LEVELS;  Surgeon: Otilio Connors, MD;  Location: [facility] [facility];  Service: Neurosurgery;  Laterality: N/A;  C4-5 C5-6 Anterior cervical decompression/diskectomy/fusion/Allograft/Plate  . Spine surgery    . Wrist ganglion excision Left   . Cholecystectomy N/A 12/07/2013    Procedure: LAPAROSCOPIC CHOLECYSTECTOMY WITH INTRAOPERATIVE CHOLANGIOGRAM;  Surgeon: Adin Hector, MD;  Location: [facility];  Service: General;  Laterality: N/A;  . Decompressive lumbar laminectomy level 2 N/A 02/15/2015    Procedure: COMPLETE DECOMPRESSIVE LUMBAR LAMINECTOMY L4-L5/ FORAMINOTOMY TO L4 NERVE ROOT AND L5 NERVE ROOT BILATERALLY;  Surgeon: [name], MD;  Location: [facility];  Service: Orthopedics;  Laterality: N/A;   Family History  Problem Relation Age of Onset  . Colon cancer Maternal Grandfather   . Diabetes Paternal Aunt     x 4 aunts  . Kidney disease Brother   . Stroke Maternal Grandmother    Social History  Substance Use Topics  . Smoking status: Former Smoker    Types: Cigarettes    Quit date: 12/14/1972  . Smokeless tobacco: Never Used  . Alcohol Use: Yes     Comment: occasionally    Review of Systems  All other systems reviewed and are negative.     Allergies  Other  Home Medications   Prior  to Admission medications   Medication Sig Start Date End Date Taking? Authorizing Provider  aspirin EC 81 MG tablet Take 81 mg by mouth daily.    Historical Provider, MD  diclofenac (VOLTAREN) 75 MG EC tablet Take 1 tablet (75 mg total) by mouth 2 (two) times daily. 07/20/15   Shawnee Knapp, MD  HYDROcodone-acetaminophen (NORCO/VICODIN) 5-325 MG per tablet Take 1 tablet by mouth at bedtime as needed for moderate pain. Patient not taking: Reported on 09/05/2015 07/20/15   Shawnee Knapp, MD  methocarbamol (ROBAXIN) 500 MG tablet Take 500 mg by mouth 2 (two) times daily.    Historical Provider, MD  modafinil (PROVIGIL) 200 MG tablet Take 200 mg by mouth daily.    Historical  Provider, MD  predniSONE (DELTASONE) 10 MG tablet 6-5-4-3-2-1 tabs po qd Patient not taking: Reported on 09/05/2015 07/20/15   Shawnee Knapp, MD  primidone (MYSOLINE) 50 MG tablet Take 50 mg by mouth 2 (two) times daily.  04/05/13   Darlyne Russian, MD  Tamsulosin HCl (FLOMAX) 0.4 MG CAPS Take 0.4 mg by mouth at bedtime.     Historical Provider, MD   BP 155/74 mmHg  Pulse 77  Temp(Src) 98.5 F (36.9 C) (Oral)  Resp 18  SpO2 100% Physical Exam  Constitutional: He appears well-developed and well-nourished. No distress.  HENT:  Head: Normocephalic and atraumatic.  Neck: Neck supple.  Cardiovascular: Normal rate and regular rhythm.   Pulmonary/Chest: Effort normal and breath sounds normal. No respiratory distress. He has no wheezes. He has no rales.  Musculoskeletal:       Back:  Tenderness through musculature of right neck.  Full AROM of right shoulder and elbow.  Pain with ROM of right wrist and fingers.  Decreased grip strength bilaterally, right weaker than left, pt stating it is secondary to pain.   Arms approximately equal size, no significant edema.  No erythema, edema, warmth associated with any joints.    Neurological: He is alert.  Skin: He is not diaphoretic.  Nursing note and vitals reviewed.   ED Course  Procedures (including critical care time) Labs Review Labs Reviewed  BASIC METABOLIC PANEL - Abnormal; Notable for the following:    Chloride 100 (*)    All other components within normal limits  CBC WITH DIFFERENTIAL/PLATELET - Abnormal; Notable for the following:    Hemoglobin 12.6 (*)    All other components within normal limits  SEDIMENTATION RATE - Abnormal; Notable for the following:    Sed Rate 64 (*)    All other components within normal limits    Imaging Review Ct Cervical Spine Wo Contrast  09/05/2015  CLINICAL DATA:  Right neck pain x 2-3 days, prior cervical discectomy/fusion EXAM: CT CERVICAL SPINE WITHOUT CONTRAST TECHNIQUE: Multidetector CT imaging of the  cervical spine was performed without intravenous contrast. Multiplanar CT image reconstructions were also generated. COMPARISON:  Cervical spine radiograph dated 05/25/2013. Cervical spine CT dated 04/06/2013. FINDINGS: Straightening of the cervical spine. No evidence of fracture or dislocation. Vertebral body heights are maintained. Dens appears intact. No prevertebral soft tissue swelling. Moderate multilevel degenerative changes, most prominent at C3-4 and C6-7. Status post C4-6 ACDF, without evidence of hardware loosening or fracture. Visualized thyroid is unremarkable. Visualized lung apices are clear. IMPRESSION: Status post C4-6 ACDF, without evidence of hardware complication. Moderate multilevel degenerative changes. No fracture or dislocation is seen. Electronically Signed   By: Julian Hy M.D.   On: 09/05/2015 12:31    EKG Interpretation  None       11:25 AM Pt also seen by Dr Eulis Foster, discussed workup with Dr Eulis Foster - basic labs, sed rate, CT c-spine wo contrast.   MDM   Final diagnoses:  Muscle strain  Neck pain  Right arm pain   Afebrile, nontoxic patient with right neck and right arm pain that began after increased activity and heavy lifting the day before the pain began.  Muscle strain vs cervical radiculopathy.  Pt also has chronic arthritic pain.  Neurovascularly intact.  No significant edema, doubt acute vascular compromise, DVT, compartment syndrome.  Labs unremarkble.  CT cervical spine showing normal postop changes and multilevel degenerative changes.  Pt also seen by Dr Eulis Foster.  Please see his note for further information.  D/C home with prednisone (pt request as it has worked well for his joint pain in the past) and norco, orthopedic follow up.  Discussed result, findings, treatment, and follow up  with patient.  Pt given return precautions.  Pt verbalizes understanding and agrees with plan.         Clayton Bibles, PA-C 09/05/15 [place], MD 09/06/15 [phone]

## 2015-09-05 NOTE — Progress Notes (Signed)
[facility] [address], [place] [address] [phone]  Date:  09/05/2015   Name:  Bryan [name]   DOB:  1946/05/27   MRN:  [id number]  PCP:  Lamar Blinks, MD    Chief Complaint: Neck Pain and Edema   History of Present Illness:  Bryan [name] is a 69 y.o. very pleasant male patient who presents with the following:  History of spinal stenosis in the lumbar region with lumbar laminectomy 4/16 (dr Gladstone Lighter), as well as cervical discectomy/ fusion in 2014 by Dr. Luiz Ochoa.  Here today with complaint of pain in his right neck for 2-3 days- worse today.  He also notes swelling of his right hand and forearm- thisis quite painful.  He just noted this swelling today.  He noted that the right hand is difficulty to use but not numb.  He just noted the swelling of his right forearm today He is not aware of any injury or other cause of these sx He otherwise feels well- no fever, etc  Patient Active Problem List   Diagnosis Date Noted  . Spinal stenosis, lumbar region, with neurogenic claudication 02/15/2015  . Acute cholecystitis with chronic cholecystitis s/p lap LOA/chole 12/07/2013 12/07/2013  . Obesity (BMI 30-39.9) 12/07/2013  . Refusal of blood transfusions as patient is Jehovah's Witness 12/07/2013  . Rheumatoid factor positive 10/26/2012  . Nonspecific abnormal finding in stool contents 07/28/2012  . Multiple joint pain 06/05/2012  . History of tobacco use-  06/05/2012  . Asthmatic bronchitis 12/15/2011  . BPH (benign prostatic hyperplasia) 12/15/2011  . Essential tremor 12/15/2011  . HTN (hypertension) 12/15/2011    Past Medical History  Diagnosis Date  . Obesity   . HTN (hypertension)   . BPH (benign prostatic hypertrophy)   . Seizures (HCC)     AS A CHILD.  [name] Ulcer   . Hearing deficit     wears hearing aids bilateral  . GERD (gastroesophageal reflux disease)     reports for indigestion he uses mustard   . Arthritis     hands, knees, cervical  area  . Essential tremor   . Sleep apnea     cpap - settings at 9 per patient     Past Surgical History  Procedure Laterality Date  . Tonsillectomy    . Eye surgery      right, growth excision  . Colonoscopy    . Vasectomy    . Anterior cervical decomp/discectomy fusion N/A 03/30/2013    Procedure: ANTERIOR CERVICAL DECOMPRESSION/DISCECTOMY FUSION 2 LEVELS;  Surgeon: Otilio Connors, MD;  Location: [place] [facility];  Service: Neurosurgery;  Laterality: N/A;  C4-5 C5-6 Anterior cervical decompression/diskectomy/fusion/Allograft/Plate  . Spine surgery    . Wrist ganglion excision Left   . Cholecystectomy N/A 12/07/2013    Procedure: LAPAROSCOPIC CHOLECYSTECTOMY WITH INTRAOPERATIVE CHOLANGIOGRAM;  Surgeon: Adin Hector, MD;  Location: [facility];  Service: General;  Laterality: N/A;  . Decompressive lumbar laminectomy level 2 N/A 02/15/2015    Procedure: COMPLETE DECOMPRESSIVE LUMBAR LAMINECTOMY L4-L5/ FORAMINOTOMY TO L4 NERVE ROOT AND L5 NERVE ROOT BILATERALLY;  Surgeon: [name], MD;  Location: [facility];  Service: Orthopedics;  Laterality: N/A;    Social History  Substance Use Topics  . Smoking status: Former Smoker    Types: Cigarettes    Quit date: 12/14/1972  . Smokeless tobacco: Never Used  . Alcohol Use: Yes     Comment: occasionally    Family History  Problem Relation Age of  Onset  . Colon cancer Maternal Grandfather   . Diabetes Paternal Aunt     x 4 aunts  . Kidney disease Brother   . Stroke Maternal Grandmother     Allergies  Allergen Reactions  . Other     BLOOD PRODUCT REFUSAL     Medication list has been reviewed and updated.  Current Outpatient Prescriptions on File Prior to Visit  Medication Sig Dispense Refill  . aspirin EC 81 MG tablet Take 81 mg by mouth daily.    . diclofenac (VOLTAREN) 75 MG EC tablet Take 1 tablet (75 mg total) by mouth 2 (two) times daily. 60 tablet 1  . Tamsulosin HCl (FLOMAX) 0.4 MG CAPS Take 0.4 mg by mouth at bedtime.     [name]  HYDROcodone-acetaminophen (NORCO/VICODIN) 5-325 MG per tablet Take 1 tablet by mouth at bedtime as needed for moderate pain. (Patient not taking: Reported on 09/05/2015) 20 tablet 0  . modafinil (PROVIGIL) 200 MG tablet Take 200 mg by mouth daily.    . predniSONE (DELTASONE) 10 MG tablet 6-5-4-3-2-1 tabs po qd (Patient not taking: Reported on 09/05/2015) 21 tablet 0  . primidone (MYSOLINE) 50 MG tablet Take 50 mg by mouth 2 (two) times daily.      No current facility-administered medications on file prior to visit.    Review of Systems:  As per HPI- otherwise negative.   Physical Examination: Filed Vitals:   09/05/15 0855  BP: 136/62  Pulse: 87  Temp: 98.9 F (37.2 C)  Resp: 16   Filed Vitals:   09/05/15 0855  Height: 5\' 4"  (1.626 m)  Weight: 221 lb (100.245 kg)   Body mass index is 37.92 kg/(m^2). Ideal Body Weight: Weight in (lb) to have BMI = 25: 145.3  GEN: WDWN, NAD, Non-toxic, A & O x 3, obese, appears his normal self except is uncomfortable with his neck and hand HEENT: Atraumatic, Normocephalic. Neck supple. No masses, No LAD.  Wears hearing aids bilaterally  Ears and Nose: No external deformity. CV: RRR, No M/G/R. No JVD. No thrill. No extra heart sounds. PULM: CTA B, no wheezes, crackles, rhonchi. No retractions. No resp. distress. No accessory muscle use. EXTR: No c/c/e NEURO Normal gait.  PSYCH: Normally interactive. Conversant. Not depressed or anxious appearing.  Calm demeanor.  There is slight swelling of the right hand and forearm apparent on exam, the forearm also feels warm.   He has pain with extension of the right wrist, normal flexion.  Weak grip right hand He has trouble turning his head to the right.  Some tenderness in right sided paracervical muscles    Assessment and Plan: Swelling of right hand  Neck pain  Here today with unusual pain in his right neck and swelling of the right hand. These may be due to mundane and unrelated causes, but also  possible that they are related (thrombosis, thoracic outlet issue).  He will proceed to the ER for evaluation and possible imaging.  Did not charge pt today  Signed Lamar Blinks, MD

## 2015-09-07 ENCOUNTER — Ambulatory Visit (INDEPENDENT_AMBULATORY_CARE_PROVIDER_SITE_OTHER): Payer: Medicare Other | Admitting: Family Medicine

## 2015-09-07 VITALS — BP 120/80 | HR 93 | Temp 98.5°F | Resp 16 | Ht 64.0 in | Wt 222.0 lb

## 2015-09-07 DIAGNOSIS — R768 Other specified abnormal immunological findings in serum: Secondary | ICD-10-CM | POA: Diagnosis not present

## 2015-09-07 DIAGNOSIS — I10 Essential (primary) hypertension: Secondary | ICD-10-CM | POA: Diagnosis not present

## 2015-09-07 DIAGNOSIS — R5383 Other fatigue: Secondary | ICD-10-CM | POA: Diagnosis not present

## 2015-09-07 DIAGNOSIS — D649 Anemia, unspecified: Secondary | ICD-10-CM | POA: Diagnosis not present

## 2015-09-07 DIAGNOSIS — F4310 Post-traumatic stress disorder, unspecified: Secondary | ICD-10-CM

## 2015-09-07 DIAGNOSIS — M4806 Spinal stenosis, lumbar region: Secondary | ICD-10-CM

## 2015-09-07 DIAGNOSIS — J4521 Mild intermittent asthma with (acute) exacerbation: Secondary | ICD-10-CM

## 2015-09-07 DIAGNOSIS — G4733 Obstructive sleep apnea (adult) (pediatric): Secondary | ICD-10-CM | POA: Diagnosis not present

## 2015-09-07 DIAGNOSIS — I7300 Raynaud's syndrome without gangrene: Secondary | ICD-10-CM

## 2015-09-07 DIAGNOSIS — M2550 Pain in unspecified joint: Secondary | ICD-10-CM | POA: Diagnosis not present

## 2015-09-07 DIAGNOSIS — R5381 Other malaise: Secondary | ICD-10-CM | POA: Diagnosis not present

## 2015-09-07 DIAGNOSIS — M48062 Spinal stenosis, lumbar region with neurogenic claudication: Secondary | ICD-10-CM

## 2015-09-07 LAB — CK TOTAL AND CKMB (NOT AT ARMC)
CK TOTAL: 135 U/L (ref 7–232)
CK, MB: 1.9 ng/mL (ref 0.0–5.0)
RELATIVE INDEX: 1.4 (ref 0.0–4.0)

## 2015-09-07 LAB — POCT CBC
GRANULOCYTE PERCENT: 80.1 % — AB (ref 37–80)
HEMATOCRIT: 38.3 % — AB (ref 43.5–53.7)
Hemoglobin: 12.6 g/dL — AB (ref 14.1–18.1)
Lymph, poc: 1.6 (ref 0.6–3.4)
MCH: 29.6 pg (ref 27–31.2)
MCHC: 32.9 g/dL (ref 31.8–35.4)
MCV: 90 fL (ref 80–97)
MID (CBC): 0.4 (ref 0–0.9)
MPV: 6.6 fL (ref 0–99.8)
PLATELET COUNT, POC: 314 10*3/uL (ref 142–424)
POC GRANULOCYTE: 7.8 — AB (ref 2–6.9)
POC LYMPH %: 16 % (ref 10–50)
POC MID %: 3.9 % (ref 0–12)
RBC: 4.25 M/uL — AB (ref 4.69–6.13)
RDW, POC: 15.7 %
WBC: 9.8 10*3/uL (ref 4.6–10.2)

## 2015-09-07 LAB — C-REACTIVE PROTEIN: CRP: 3.3 mg/dL — ABNORMAL HIGH (ref <0.60)

## 2015-09-07 LAB — POCT SEDIMENTATION RATE: POCT SED RATE: 72 mm/hr — AB (ref 0–22)

## 2015-09-07 LAB — CK: CK TOTAL: 137 U/L (ref 7–232)

## 2015-09-07 LAB — RHEUMATOID FACTOR: Rhuematoid fact SerPl-aCnc: 117 IU/mL — ABNORMAL HIGH (ref <=14)

## 2015-09-07 MED ORDER — AZITHROMYCIN 500 MG PO TABS: 500.0000 mg | ORAL_TABLET | Freq: Every day | ORAL | Status: DC

## 2015-09-07 MED ORDER — ALBUTEROL SULFATE (2.5 MG/3ML) 0.083% IN NEBU
2.5000 mg | INHALATION_SOLUTION | Freq: Once | RESPIRATORY_TRACT | Status: AC
  Administered 2015-09-07: 2.5 mg via RESPIRATORY_TRACT

## 2015-09-07 MED ORDER — ALBUTEROL SULFATE 108 (90 BASE) MCG/ACT IN AEPB: 2.0000 | INHALATION_SPRAY | RESPIRATORY_TRACT | Status: DC | PRN

## 2015-09-07 NOTE — Patient Instructions (Signed)
Anemia, Nonspecific Anemia is a condition in which the concentration of red blood cells or hemoglobin in the blood is below normal. Hemoglobin is a substance in red blood cells that carries oxygen to the tissues of the body. Anemia results in not enough oxygen reaching these tissues.  CAUSES  Common causes of anemia include:   Excessive bleeding. Bleeding may be internal or external. This includes excessive bleeding from periods (in women) or from the intestine.   Poor nutrition.   Chronic kidney, thyroid, and liver disease.  Bone marrow disorders that decrease red blood cell production.  Cancer and treatments for cancer.  HIV, AIDS, and their treatments.  Spleen problems that increase red blood cell destruction.  Blood disorders.  Excess destruction of red blood cells due to infection, medicines, and autoimmune disorders. SIGNS AND SYMPTOMS   Minor weakness.   Dizziness.   Headache.  Palpitations.   Shortness of breath, especially with exercise.   Paleness.  Cold sensitivity.  Indigestion.  Nausea.  Difficulty sleeping.  Difficulty concentrating. Symptoms may occur suddenly or they may develop slowly.  DIAGNOSIS  Additional blood tests are often needed. These help your health care provider determine the best treatment. Your health care provider will check your stool for blood and look for other causes of blood loss.  TREATMENT  Treatment varies depending on the cause of the anemia. Treatment can include:   Supplements of iron, vitamin D17, or folic acid.   Hormone medicines.   A blood transfusion. This may be needed if blood loss is severe.   Hospitalization. This may be needed if there is significant continual blood loss.   Dietary changes.  Spleen removal. HOME CARE INSTRUCTIONS Keep all follow-up appointments. It often takes many weeks to correct anemia, and having your health care provider check on your condition and your response to  treatment is very important. SEEK IMMEDIATE MEDICAL CARE IF:   You develop extreme weakness, shortness of breath, or chest pain.   You become dizzy or have trouble concentrating.  You develop heavy vaginal bleeding.   You develop a rash.   You have bloody or black, tarry stools.   You faint.   You vomit up blood.   You vomit repeatedly.   You have abdominal pain.  You have a fever or persistent symptoms for more than 2-3 days.   You have a fever and your symptoms suddenly get worse.   You are dehydrated.  MAKE SURE YOU:  Understand these instructions.  Will watch your condition.  Will get help right away if you are not doing well or get worse.   This information is not intended to replace advice given to you by your health care provider. Make sure you discuss any questions you have with your health care provider.   Document Released: 12/06/2004 Document Revised: 07/01/2013 Document Reviewed: 04/24/2013 Elsevier Interactive Patient Education 2016 Elsevier Inc[employer]  Acute Bronchitis Bronchitis is inflammation of the airways that extend from the windpipe into the lungs (bronchi). The inflammation often causes mucus to develop. This leads to a cough, which is the most common symptom of bronchitis.  In acute bronchitis, the condition usually develops suddenly and goes away over time, usually in a couple weeks. Smoking, allergies, and asthma can make bronchitis worse. Repeated episodes of bronchitis may cause further lung problems.  CAUSES Acute bronchitis is most often caused by the same virus that causes a cold. The virus can spread from person to person (contagious) through coughing, sneezing, and touching  contaminated objects. SIGNS AND SYMPTOMS   Cough.   Fever.   Coughing up mucus.   Body aches.   Chest congestion.   Chills.   Shortness of breath.   Sore throat.  DIAGNOSIS  Acute bronchitis is usually diagnosed through a physical exam. Your  health care provider will also ask you questions about your medical history. Tests, such as chest X-rays, are sometimes done to rule out other conditions.  TREATMENT  Acute bronchitis usually goes away in a couple weeks. Oftentimes, no medical treatment is necessary. Medicines are sometimes given for relief of fever or cough. Antibiotic medicines are usually not needed but may be prescribed in certain situations. In some cases, an inhaler may be recommended to help reduce shortness of breath and control the cough. A cool mist vaporizer may also be used to help thin bronchial secretions and make it easier to clear the chest.  HOME CARE INSTRUCTIONS  Get plenty of rest.   Drink enough fluids to keep your urine clear or pale yellow (unless you have a medical condition that requires fluid restriction). Increasing fluids may help thin your respiratory secretions (sputum) and reduce chest congestion, and it will prevent dehydration.   Take medicines only as directed by your health care provider.  If you were prescribed an antibiotic medicine, finish it all even if you start to feel better.  Avoid smoking and secondhand smoke. Exposure to cigarette smoke or irritating chemicals will make bronchitis worse. If you are a smoker, consider using nicotine gum or skin patches to help control withdrawal symptoms. Quitting smoking will help your lungs heal faster.   Reduce the chances of another bout of acute bronchitis by washing your hands frequently, avoiding people with cold symptoms, and trying not to touch your hands to your mouth, nose, or eyes.   Keep all follow-up visits as directed by your health care provider.  SEEK MEDICAL CARE IF: Your symptoms do not improve after 1 week of treatment.  SEEK IMMEDIATE MEDICAL CARE IF:  You develop an increased fever or chills.   You have chest pain.   You have severe shortness of breath.  You have bloody sputum.   You develop dehydration.  You  faint or repeatedly feel like you are going to pass out.  You develop repeated vomiting.  You develop a severe headache. MAKE SURE YOU:   Understand these instructions.  Will watch your condition.  Will get help right away if you are not doing well or get worse.   This information is not intended to replace advice given to you by your health care provider. Make sure you discuss any questions you have with your health care provider.   Document Released: 12/06/2004 Document Revised: 11/19/2014 Document Reviewed: 04/21/2013 Elsevier Interactive Patient Education [employer].

## 2015-09-07 NOTE — Progress Notes (Signed)
Subjective:  This chart was scribed for Delman Cheadle MD, by Tamsen Roers, at [facility].  This patient was seen in room 9 and the patient's care was started at 11:59 AM.    Patient ID: Bryan [name], male    DOB: 1946-02-13, 69 y.o.   MRN: [id number] Chief Complaint  Patient presents with  . Follow-up    Swelling of right hand  . Fatigue    HPI  HPI Comments: Bryan [name] is a 69 y.o. male who presents to the [facility] complaining of not having any energy.  He denies any pain in his hand any longer but still notes of some swelling.  He states that these symptoms occurs every 3-4 months.  He went to the [place] and was told that his PTSD could cause sleep apnea (which came back positive) so he was given paperwork which need to be filled out by a physician here.  ---- He has a history of severe DDD, c-spine surgery.  Was seen 2 days ago by his PCP for acute neck pain radiating down his right arm to his hand and was complaining of swelling and coldness in his right hand. As thrombosis or thoracic outlet could not be excluded, patient was sent to the ER for emergent evaluation.  They put CT of c-spine in ER which showed multi level DDD and prior surgery. He was put on 6 day 10 mg prednisone taper with as needed Vicodin.  That same evening i was seeing his wife for an acute illness and patient was in exam room with her when he began to feel acutely ill with fevers/chills and tremor and was still having severe neck and arm pain.  It was noted that his sed rate at the ER was 64 though BCB and BPM was normal. Upon chart review, it appeared that patient had positive ANA and RF around 2012.  He was referred to rheumatology and advised to seen rheumatology evaluation through [place] but patient this ever happening. He was counseled to come back today for a recheck on his symptoms and follow up on his possible autoimmune disease.    In December 2012, he had RF factor of  74 with a positive ANA 1 320 inhomogeneous pattern.  Patient saw Dr. Amil Amen February 2013 and was diagnosed with Raynaud's.    Patient Active Problem List   Diagnosis Date Noted  . Spinal stenosis, lumbar region, with neurogenic claudication 02/15/2015  . Acute cholecystitis with chronic cholecystitis s/p lap LOA/chole 12/07/2013 12/07/2013  . Obesity (BMI 30-39.9) 12/07/2013  . Refusal of blood transfusions as patient is Jehovah's Witness 12/07/2013  . Rheumatoid factor positive 10/26/2012  . Nonspecific abnormal finding in stool contents 07/28/2012  . Multiple joint pain 06/05/2012  . History of tobacco use-  06/05/2012  . Asthmatic bronchitis 12/15/2011  . BPH (benign prostatic hyperplasia) 12/15/2011  . Essential tremor 12/15/2011  . HTN (hypertension) 12/15/2011   Past Medical History  Diagnosis Date  . Obesity   . HTN (hypertension)   . BPH (benign prostatic hypertrophy)   . Seizures (HCC)     AS A CHILD.  [name] Ulcer   . Hearing deficit     wears hearing aids bilateral  . GERD (gastroesophageal reflux disease)     reports for indigestion he uses mustard   . Arthritis     hands, knees, cervical area  . Essential tremor   . Sleep apnea  cpap - settings at 9 per patient    Past Surgical History  Procedure Laterality Date  . Tonsillectomy    . Eye surgery      right, growth excision  . Colonoscopy    . Vasectomy    . Anterior cervical decomp/discectomy fusion N/A 03/30/2013    Procedure: ANTERIOR CERVICAL DECOMPRESSION/DISCECTOMY FUSION 2 LEVELS;  Surgeon: Otilio Connors, MD;  Location: [place] [facility];  Service: Neurosurgery;  Laterality: N/A;  C4-5 C5-6 Anterior cervical decompression/diskectomy/fusion/Allograft/Plate  . Spine surgery    . Wrist ganglion excision Left   . Cholecystectomy N/A 12/07/2013    Procedure: LAPAROSCOPIC CHOLECYSTECTOMY WITH INTRAOPERATIVE CHOLANGIOGRAM;  Surgeon: Adin Hector, MD;  Location: [facility];  Service: General;  Laterality: N/A;  .  Decompressive lumbar laminectomy level 2 N/A 02/15/2015    Procedure: COMPLETE DECOMPRESSIVE LUMBAR LAMINECTOMY L4-L5/ FORAMINOTOMY TO L4 NERVE ROOT AND L5 NERVE ROOT BILATERALLY;  Surgeon: [name], MD;  Location: [facility];  Service: Orthopedics;  Laterality: N/A;   Allergies  Allergen Reactions  . Other     BLOOD PRODUCT REFUSAL    Prior to Admission medications   Medication Sig Start Date End Date Taking? Authorizing Provider  aspirin EC 81 MG tablet Take 81 mg by mouth daily.   Yes Historical Provider, MD  diclofenac (VOLTAREN) 75 MG EC tablet Take 1 tablet (75 mg total) by mouth 2 (two) times daily. 07/20/15  Yes Shawnee Knapp, MD  HYDROcodone-acetaminophen (NORCO/VICODIN) 5-325 MG tablet Take 1-2 tablets by mouth every 4 (four) hours as needed for moderate pain or severe pain. 09/05/15  Yes Clayton Bibles, PA-C  methocarbamol (ROBAXIN) 500 MG tablet Take 500 mg by mouth 2 (two) times daily.   Yes Historical Provider, MD  modafinil (PROVIGIL) 200 MG tablet Take 200 mg by mouth daily.   Yes Historical Provider, MD  predniSONE (DELTASONE) 10 MG tablet 6-5-4-3-2-1 tabs po qd 09/05/15  Yes Clayton Bibles, PA-C  primidone (MYSOLINE) 50 MG tablet Take 50 mg by mouth 2 (two) times daily.  04/05/13  Yes Darlyne Russian, MD  Tamsulosin HCl (FLOMAX) 0.4 MG CAPS Take 0.4 mg by mouth at bedtime.    Yes Historical Provider, MD   Social History   Social History  . Marital Status: Married    Spouse Name: N/A  . Number of Children: 2  . Years of Education: N/A   Occupational History  . bus driver    Social History Main Topics  . Smoking status: Former Smoker    Types: Cigarettes    Quit date: 12/14/1972  . Smokeless tobacco: Never Used  . Alcohol Use: Yes     Comment: occasionally  . Drug Use: No  . Sexual Activity: Yes    Birth Control/ Protection: None   Other Topics Concern  . Not on file   Social History Narrative     Review of Systems  Constitutional: Positive for fatigue. Negative for  fever and chills.  HENT: Positive for congestion, postnasal drip, rhinorrhea and sinus pressure. Negative for ear pain, facial swelling, sore throat and trouble swallowing.   Eyes: Negative for pain, redness and itching.  Musculoskeletal: Positive for myalgias, back pain, arthralgias, neck pain and neck stiffness. Negative for joint swelling and gait problem.  Skin: Negative for rash.  Hematological: Negative for adenopathy.  Psychiatric/Behavioral: Positive for sleep disturbance.       Objective:   Physical Exam  Constitutional: He is oriented to person, place, and time. He appears well-developed and  well-nourished. No distress.  HENT:  Head: Normocephalic and atraumatic.  Right Ear: External ear normal.  Left Ear: External ear normal.  Mid ear effusion on the right.  Post nasal drip in the oropharynx   Eyes: Pupils are equal, round, and reactive to light.  Cardiovascular: Normal rate.   Pulmonary/Chest: No respiratory distress. He has wheezes.  Expiratory wheezing.   Musculoskeletal:  Right forearm 22 cm Left forearm 23 cm   Neurological: He is alert and oriented to person, place, and time.  Skin: Skin is warm and dry.   Filed Vitals:   09/07/15 1115  BP: 120/80  Pulse: 93  Temp: 98.5 F (36.9 C)  TempSrc: Oral  Resp: 16  Height: 5\' 4"  (1.626 m)  Weight: 222 lb (100.699 kg)  SpO2: 97%    Results for orders placed or performed in visit on 09/07/15 (from the past 24 hour(s))  POCT CBC     Status: Abnormal   Collection Time: 09/07/15 12:54 PM  Result Value Ref Range   WBC 9.8 4.6 - 10.2 K/uL   Lymph, poc 1.6 0.6 - 3.4   POC LYMPH PERCENT 16.0 10 - 50 %L   MID (cbc) 0.4 0 - 0.9   POC MID % 3.9 0 - 12 %M   POC Granulocyte 7.8 (A) 2 - 6.9   Granulocyte percent 80.1 (A) 37 - 80 %G   RBC 4.25 (A) 4.69 - 6.13 M/uL   Hemoglobin 12.6 (A) 14.1 - 18.1 g/dL   HCT, POC 38.3 (A) 43.5 - 53.7 %   MCV 90.0 80 - 97 fL   MCH, POC 29.6 27 - 31.2 pg   MCHC 32.9 31.8 - 35.4 g/dL     RDW, POC 15.7 %   Platelet Count, POC 314 142 - 424 K/uL   MPV 6.6 0 - 99.8 fL      Assessment & Plan:   1. Asthmatic bronchitis, mild intermittent, with acute exacerbation   2. Essential hypertension   3. Multiple joint pain   4. Rheumatoid factor positive   5. Spinal stenosis, lumbar region, with neurogenic claudication   6. Obstructive sleep apnea   7. PTSD (post-traumatic stress disorder)   8. Other fatigue   9. Anemia, unspecified anemia type   10. Raynaud disease     Orders Placed This Encounter  Procedures  . C-reactive protein  . ANA  . Rheumatoid factor  . Cyclic citrul peptide antibody, IgG  . CK  . Vitamin B12  . CK total and CKMB (cardiac)not at [facility]  . Anti-nuclear ab-titer (ANA titer)  . Ambulatory referral to Rheumatology    Referral Priority:  Routine    Referral Type:  Consultation    Referral Reason:  Specialty Services Required    Requested Specialty:  Rheumatology    Number of Visits Requested:  1  . POCT CBC  . POCT SEDIMENTATION RATE    Meds ordered this encounter  Medications  . albuterol (PROVENTIL) (2.5 MG/3ML) 0.083% nebulizer solution 2.5 mg    Sig:   . azithromycin (ZITHROMAX) 500 MG tablet    Sig: Take 1 tablet (500 mg total) by mouth daily.    Dispense:  3 tablet    Refill:  0  . Albuterol Sulfate (PROAIR RESPICLICK) 034 (90 BASE) MCG/ACT AEPB    Sig: Inhale 2 puffs into the lungs every 4 (four) hours as needed.    Dispense:  1 each    Refill:  0    I personally performed  the services described in this documentation, which was scribed in my presence. The recorded information has been reviewed and considered, and addended by me as needed.  Delman Cheadle, MD MPH

## 2015-09-08 LAB — ANA: Anti Nuclear Antibody(ANA): POSITIVE — AB

## 2015-09-08 LAB — ANTI-NUCLEAR AB-TITER (ANA TITER)

## 2015-09-08 LAB — VITAMIN B12: VITAMIN B 12: 350 pg/mL (ref 211–911)

## 2015-09-08 LAB — CYCLIC CITRUL PEPTIDE ANTIBODY, IGG: CYCLIC CITRULLIN PEPTIDE AB: 200 U — AB

## 2015-09-10 ENCOUNTER — Ambulatory Visit (INDEPENDENT_AMBULATORY_CARE_PROVIDER_SITE_OTHER): Payer: Medicare Other | Admitting: Family Medicine

## 2015-09-10 VITALS — BP 122/72 | HR 73 | Temp 98.0°F | Resp 18 | Ht 64.0 in | Wt 220.1 lb

## 2015-09-10 DIAGNOSIS — J4530 Mild persistent asthma, uncomplicated: Secondary | ICD-10-CM | POA: Diagnosis not present

## 2015-09-10 MED ORDER — PREDNISONE 20 MG PO TABS: 40.0000 mg | ORAL_TABLET | Freq: Every day | ORAL | Status: DC

## 2015-09-10 NOTE — Patient Instructions (Signed)

## 2015-09-10 NOTE — Progress Notes (Signed)
Subjective:    Patient ID: Bryan [name], male    DOB: [birth year], 69 y.o.   MRN: [id number] This chart was scribed for Delman Cheadle, MD by Marti Sleigh, Medical Scribe. This patient was seen in Room 1 and the patient's care was started a 1:43 PM.  Chief Complaint  Patient presents with  . Follow-up    f/u with [name] from bronchitis (seen on 10/26)  . Dizziness    feeling a little off balanced-happens after taking the inhaler    HPI HPI Comments: Bryan [name] is a 69 y.o. male who presents to [facility] for follow up. He states his arm, and neck pain has improved. He states his inhaler makes him dizzy. He used his inhaler this morning, 3 hours ago. He denies productive cough. He states has slept well throughout his prednisone course.   Pt reports for follow up after being seen 5 days ago by Dr. Edilia Bo with right-sided neck pain. Pt was sent to ER for evaluation due to atypical sx. He was started on hydro and pred in the ER. I was evaluating his wife for acute UTI, and during the visit he became diaphoretic, fever, chills. worsening chills. He appeared clinically stable on vitals and exam and was advised to continue treatment regimen and recheck in two days. Pt does have severe arthritis with prior c-spine fusion. He also had raynaud's diagnosed by Dr. Amil Amen in 2013, after a positive RF and ANA. I suspected that pt was developing the same bronchitis his wife had suffered from for the past week, and perhaps starting on prednisone allowed his sx to worsen suddenly. Pt was put on zithro QD times 3, as well as albuterol.   Past Medical History  Diagnosis Date  . Obesity   . HTN (hypertension)   . BPH (benign prostatic hypertrophy)   . Seizures (HCC)     AS A CHILD.  [name] Ulcer   . Hearing deficit     wears hearing aids bilateral  . GERD (gastroesophageal reflux disease)     reports for indigestion he uses mustard   . Arthritis     hands, knees, cervical area  . Essential tremor   . Sleep  apnea     cpap - settings at 9 per patient    Allergies  Allergen Reactions  . Other     BLOOD PRODUCT REFUSAL    Current Outpatient Prescriptions on File Prior to Visit  Medication Sig Dispense Refill  . Albuterol Sulfate (PROAIR RESPICLICK) 215 (90 BASE) MCG/ACT AEPB Inhale 2 puffs into the lungs every 4 (four) hours as needed. 1 each 0  . aspirin EC 81 MG tablet Take 81 mg by mouth daily.    [name] azithromycin (ZITHROMAX) 500 MG tablet Take 1 tablet (500 mg total) by mouth daily. 3 tablet 0  . diclofenac (VOLTAREN) 75 MG EC tablet Take 1 tablet (75 mg total) by mouth 2 (two) times daily. 60 tablet 1  . HYDROcodone-acetaminophen (NORCO/VICODIN) 5-325 MG tablet Take 1-2 tablets by mouth every 4 (four) hours as needed for moderate pain or severe pain. 15 tablet 0  . methocarbamol (ROBAXIN) 500 MG tablet Take 500 mg by mouth 2 (two) times daily.    . modafinil (PROVIGIL) 200 MG tablet Take 200 mg by mouth daily.    . primidone (MYSOLINE) 50 MG tablet Take 50 mg by mouth 2 (two) times daily.     . Tamsulosin HCl (FLOMAX) 0.4 MG CAPS Take 0.4 mg by  mouth at bedtime.      No current facility-administered medications on file prior to visit.    Review of Systems  Constitutional: Negative for fever and chills.  HENT: Positive for congestion. Negative for ear discharge and ear pain.   Respiratory: Positive for cough and shortness of breath.   Musculoskeletal: Negative for myalgias.      Objective:  BP 122/72 mmHg  Pulse 73  Temp(Src) 98 F (36.7 C) (Oral)  Resp 18  Ht 5\' 4"  (1.626 m)  Wt 220 lb 2 oz (99.848 kg)  BMI 37.77 kg/m2  SpO2 97%  Physical Exam  Constitutional: He is oriented to person, place, and time. He appears well-developed and well-nourished. No distress.  HENT:  Head: Normocephalic and atraumatic.  Erythema and retraction in bilateral TMs. Oropharynx is clear.   Eyes: Pupils are equal, round, and reactive to light.  Neck: Neck supple.  Cardiovascular: Normal rate.     Pulmonary/Chest: Effort normal. No respiratory distress.  Lungs clear, subtle expiratory wheezing in the left lobe. Decreased air movement throughout.  Musculoskeletal: Normal range of motion.  Neurological: He is alert and oriented to person, place, and time. Coordination normal.  Skin: Skin is warm and dry. He is not diaphoretic.  Psychiatric: He has a normal mood and affect. His behavior is normal.  Nursing note and vitals reviewed.     Assessment & Plan:   1. Asthmatic bronchitis, mild persistent, uncomplicated   Improving slowly so extend pred for a few more days as on day 5 of 6 day taper. Fortunately c-spine radicular pain is resolved!  Meds ordered this encounter  Medications  . predniSONE (DELTASONE) 20 MG tablet    Sig: Take 2 tablets (40 mg total) by mouth daily with breakfast.    Dispense:  6 tablet    Refill:  0    I personally performed the services described in this documentation, which was scribed in my presence. The recorded information has been reviewed and considered, and addended by me as needed.  Delman Cheadle, MD MPH   By signing my name below, I, Judithe Modest, attest that this documentation has been prepared under the direction and in the presence of Delman Cheadle, MD. Electronically Signed: Judithe Modest, ER Scribe. 09/10/2015. 1:48 PM.

## 2015-09-13 DIAGNOSIS — M2550 Pain in unspecified joint: Secondary | ICD-10-CM | POA: Diagnosis not present

## 2015-09-13 DIAGNOSIS — I7300 Raynaud's syndrome without gangrene: Secondary | ICD-10-CM | POA: Diagnosis not present

## 2015-09-13 DIAGNOSIS — M0579 Rheumatoid arthritis with rheumatoid factor of multiple sites without organ or systems involvement: Secondary | ICD-10-CM | POA: Diagnosis not present

## 2015-09-13 DIAGNOSIS — M7989 Other specified soft tissue disorders: Secondary | ICD-10-CM | POA: Diagnosis not present

## 2015-09-23 ENCOUNTER — Encounter: Payer: Self-pay | Admitting: Family Medicine

## 2015-10-02 ENCOUNTER — Other Ambulatory Visit: Payer: Self-pay | Admitting: Family Medicine

## 2015-10-04 ENCOUNTER — Other Ambulatory Visit: Payer: Self-pay | Admitting: Family Medicine

## 2015-10-04 MED ORDER — DICLOFENAC SODIUM 75 MG PO TBEC: 75.0000 mg | DELAYED_RELEASE_TABLET | Freq: Two times a day (BID) | ORAL | Status: DC

## 2015-10-09 ENCOUNTER — Ambulatory Visit (INDEPENDENT_AMBULATORY_CARE_PROVIDER_SITE_OTHER): Payer: Medicare Other | Admitting: Family Medicine

## 2015-10-09 VITALS — BP 142/72 | HR 78 | Temp 98.3°F | Resp 18 | Ht 64.5 in | Wt 223.0 lb

## 2015-10-09 DIAGNOSIS — M5136 Other intervertebral disc degeneration, lumbar region: Secondary | ICD-10-CM

## 2015-10-09 DIAGNOSIS — M5441 Lumbago with sciatica, right side: Secondary | ICD-10-CM | POA: Diagnosis not present

## 2015-10-09 DIAGNOSIS — M5442 Lumbago with sciatica, left side: Secondary | ICD-10-CM

## 2015-10-09 DIAGNOSIS — R768 Other specified abnormal immunological findings in serum: Secondary | ICD-10-CM

## 2015-10-09 DIAGNOSIS — M2550 Pain in unspecified joint: Secondary | ICD-10-CM | POA: Diagnosis not present

## 2015-10-09 DIAGNOSIS — M51369 Other intervertebral disc degeneration, lumbar region without mention of lumbar back pain or lower extremity pain: Secondary | ICD-10-CM

## 2015-10-09 MED ORDER — PREDNISONE 20 MG PO TABS: ORAL_TABLET | ORAL | Status: DC

## 2015-10-09 NOTE — Progress Notes (Signed)
Chief Complaint:  Chief Complaint  Patient presents with  . Back Pain    x 2 weeks, spasms, getting worse, radiates down to legs    HPI: Bryan [name] is a 69 y.o. male who reports to [facility] today complaining of 2 week hx of back pain spasms and shoots down bilateral legs, getting worse since he got his first methotrexate injection . He was recently dx with RA, he see rhe . He feels it is getting worse. Normally it eases up when he walks but feels like a rod in his leg. He takes a injection once a week. He has RA. The shot takes care of everythign on his upper in regards to pain but he feels it has not done much for his Joesphine Schemm. He had mod radicular pain going down back to both legs, it is alternating.  He has a significant hx of DJD. Ortho doctor is Dr Gladstone Lighter  IMPRESSION: Status post C4-6 ACDF, without evidence of hardware complication.  Moderate multilevel degenerative changes.  No fracture or dislocation is seen.   Electronically Signed  By: Julian Hy M.D.  On: 09/05/2015 12:31   CLINICAL DATA: Back pain of unknown origin with sciatica of unspecified laterality  EXAM: LUMBAR SPINE - COMPLETE 4+ VIEW  COMPARISON: 07/19/2013  FINDINGS: Five non-rib-bearing lumbar vertebrae.  Bones appear demineralized.  Multilevel disc space narrowing and endplate spur formation greatest at L2-L3 and L3-L4.  Vertebral body heights maintained without fracture or subluxation.  Mild facet degenerative changes lower lumbar spine.  No definite spondylolysis.  Mild broad-based dextro convex thoracolumbar scoliosis.  SI joints symmetric.  IMPRESSION: Degenerative disc and facet disease changes of the lumbar spine with dextro convex scoliosis.  No acute abnormalities.   Electronically Signed  By: Lavonia Dana M.D.  On: 01/03/2015 16:33       Past Medical History  Diagnosis Date  . Obesity   . HTN (hypertension)   . BPH (benign prostatic  hypertrophy)   . Seizures (HCC)     AS A CHILD.  [name] Ulcer   . Hearing deficit     wears hearing aids bilateral  . GERD (gastroesophageal reflux disease)     reports for indigestion he uses mustard   . Arthritis     hands, knees, cervical area  . Essential tremor   . Sleep apnea     cpap - settings at 9 per patient    Past Surgical History  Procedure Laterality Date  . Tonsillectomy    . Eye surgery      right, growth excision  . Colonoscopy    . Vasectomy    . Anterior cervical decomp/discectomy fusion N/A 03/30/2013    Procedure: ANTERIOR CERVICAL DECOMPRESSION/DISCECTOMY FUSION 2 LEVELS;  Surgeon: Otilio Connors, MD;  Location: [place] [facility];  Service: Neurosurgery;  Laterality: N/A;  C4-5 C5-6 Anterior cervical decompression/diskectomy/fusion/Allograft/Plate  . Spine surgery    . Wrist ganglion excision Left   . Cholecystectomy N/A 12/07/2013    Procedure: LAPAROSCOPIC CHOLECYSTECTOMY WITH INTRAOPERATIVE CHOLANGIOGRAM;  Surgeon: Adin Hector, MD;  Location: [facility];  Service: General;  Laterality: N/A;  . Decompressive lumbar laminectomy level 2 N/A 02/15/2015    Procedure: COMPLETE DECOMPRESSIVE LUMBAR LAMINECTOMY L4-L5/ FORAMINOTOMY TO L4 NERVE ROOT AND L5 NERVE ROOT BILATERALLY;  Surgeon: [name], MD;  Location: [facility];  Service: Orthopedics;  Laterality: N/A;   Social History   Social History  . Marital Status: Married    Spouse Name: N/A  .  Number of Children: 2  . Years of Education: N/A   Occupational History  . bus driver    Social History Main Topics  . Smoking status: Former Smoker    Types: Cigarettes    Quit date: 12/14/1972  . Smokeless tobacco: Never Used  . Alcohol Use: Yes     Comment: occasionally  . Drug Use: No  . Sexual Activity: Yes    Birth Control/ Protection: None   Other Topics Concern  . None   Social History Narrative   Family History  Problem Relation Age of Onset  . Colon cancer Maternal Grandfather   . Diabetes Paternal  Aunt     x 4 aunts  . Kidney disease Brother   . Stroke Maternal Grandmother    Allergies  Allergen Reactions  . Other     BLOOD PRODUCT REFUSAL    Prior to Admission medications   Medication Sig Start Date End Date Taking? Authorizing Provider  aspirin EC 81 MG tablet Take 81 mg by mouth daily.   Yes Historical Provider, MD  Methotrexate, PF, 25 MG/0.4ML SOAJ Inject into the skin.   Yes Historical Provider, MD  primidone (MYSOLINE) 50 MG tablet Take 50 mg by mouth 2 (two) times daily.  04/05/13  Yes Darlyne Russian, MD  Albuterol Sulfate (PROAIR RESPICLICK) 123XX123 (90 BASE) MCG/ACT AEPB Inhale 2 puffs into the lungs every 4 (four) hours as needed. Patient not taking: Reported on 10/09/2015 09/07/15   Shawnee Knapp, MD  azithromycin (ZITHROMAX) 500 MG tablet Take 1 tablet (500 mg total) by mouth daily. Patient not taking: Reported on 10/09/2015 09/07/15   Shawnee Knapp, MD  diclofenac (VOLTAREN) 75 MG EC tablet Take 1 tablet (75 mg total) by mouth 2 (two) times daily. Patient not taking: Reported on 10/09/2015 10/04/15   Shawnee Knapp, MD  HYDROcodone-acetaminophen (NORCO/VICODIN) 5-325 MG tablet Take 1-2 tablets by mouth every 4 (four) hours as needed for moderate pain or severe pain. Patient not taking: Reported on 10/09/2015 09/05/15   Clayton Bibles, PA-C  methocarbamol (ROBAXIN) 500 MG tablet Take 500 mg by mouth 2 (two) times daily.    Historical Provider, MD  modafinil (PROVIGIL) 200 MG tablet Take 200 mg by mouth daily.    Historical Provider, MD  Tamsulosin HCl (FLOMAX) 0.4 MG CAPS Take 0.4 mg by mouth at bedtime.     Historical Provider, MD     ROS: The patient denies fevers, chills, night sweats, unintentional weight loss, chest pain, palpitations, wheezing, dyspnea on exertion, nausea, vomiting, abdominal pain, dysuria, hematuria, melena, numbness, weakness, or tingling.   All other systems have been reviewed and were otherwise negative with the exception of those mentioned in the HPI and as  above.    PHYSICAL EXAM: Filed Vitals:   10/09/15 0918  BP: 142/72  Pulse: 78  Temp: 98.3 F (36.8 C)  Resp: 18   Body mass index is 37.7 kg/(m^2).   General: Alert, no acute distress HEENT:  Normocephalic, atraumatic, oropharynx patent. EOMI, PERRLA Cardiovascular:  Regular rate and rhythm, no rubs murmurs or gallops.   Respiratory: Clear to auscultation bilaterally.  MInimal exp wheezes, No rales, or rhonchi.  No cyanosis, no use of accessory musculature Abdominal: No organomegaly, abdomen is soft and non-tender, positive bowel sounds. No masses. Skin: No rashes. Neurologic: Facial musculature symmetric. Psychiatric: Patient acts appropriately throughout our interaction. Lymphatic: No cervical or submandibular lymphadenopathy Musculoskeletal: Gait intact. No edema + paramsk tenderness  Decrease ROM ( age appropriate)  5/5 strength, 2/2 DTRs No saddle anesthesia Straight leg negative Hip and knee exam--normal   LABS: Results for orders placed or performed in visit on 09/07/15  C-reactive protein  Result Value Ref Range   CRP 3.3 (H) <0.60 mg/dL  ANA  Result Value Ref Range   Anit Nuclear Antibody(ANA) POS (A) NEGATIVE  Rheumatoid factor  Result Value Ref Range   Rhuematoid fact SerPl-aCnc 117 (H) XX123456 IU/mL  Cyclic citrul peptide antibody, IgG  Result Value Ref Range   Cyclic Citrullin Peptide Ab 200 (H) Units  CK  Result Value Ref Range   Total CK 137 7 - 232 U/L  Vitamin B12  Result Value Ref Range   Vitamin B-12 350 211 - 911 pg/mL  CK total and CKMB (cardiac)not at [facility]  Result Value Ref Range   Total CK 135 7 - 232 U/L   CK, MB 1.9 0.0 - 5.0 ng/mL   Relative Index 1.4 0.0 - 4.0  Anti-nuclear ab-titer (ANA titer)  Result Value Ref Range   ANA Pattern 1 HOMOGENEOUS    ANA Titer 1 1:320 (H) titer  POCT CBC  Result Value Ref Range   WBC 9.8 4.6 - 10.2 K/uL   Lymph, poc 1.6 0.6 - 3.4   POC LYMPH PERCENT 16.0 10 - 50 %L   MID (cbc) 0.4 0 - 0.9   POC  MID % 3.9 0 - 12 %M   POC Granulocyte 7.8 (A) 2 - 6.9   Granulocyte percent 80.1 (A) 37 - 80 %G   RBC 4.25 (A) 4.69 - 6.13 M/uL   Hemoglobin 12.6 (A) 14.1 - 18.1 g/dL   HCT, POC 38.3 (A) 43.5 - 53.7 %   MCV 90.0 80 - 97 fL   MCH, POC 29.6 27 - 31.2 pg   MCHC 32.9 31.8 - 35.4 g/dL   RDW, POC 15.7 %   Platelet Count, POC 314 142 - 424 K/uL   MPV 6.6 0 - 99.8 fL  POCT SEDIMENTATION RATE  Result Value Ref Range   POCT SED RATE 72 (A) 0 - 22 mm/hr     EKG/XRAY:   Primary read interpreted by Dr. Marin Comment at [facility].   ASSESSMENT/PLAN: Encounter Diagnoses  Name Primary?  . DDD (degenerative disc disease), lumbar Yes  . Bilateral low back pain with sciatica, sciatica laterality unspecified   . Multiple joint pain   . Rheumatoid factor positive    Diffuse pain improved with methotrexate but only in upper extremity He states his pain is worse in the [place]  He wears his wallet in back pocket so i have asked him to stop doing that in case he aggravates the piriformis and sciatic n when he sits down with wallet in pocket.  He was rx prednisone taper and see if this helps, i fno imrpovement then I have asked him to call his rheumatologist to see if this is related to the methotrexate, which I do not suspect since he is improved in his UE. ALso recently on abx for Asthmatic bronchitis, she staillhas some wheezing so this will also help He sees Dr Gladstone Lighter and has significant DJD, if the prednisone taper doe snot work I have advised him to call RA doctor and also to make appt with Dr Charlestine Night perhaps he needs a repeat L spine and ESI He had some improvement prednisone the last time. Advise to take with food.  Fu prn  Gross sideeffects, risk and benefits, and alternatives of medications d/w patient. Patient is  aware that all medications have potential sideeffects and we are unable to predict every sideeffect or drug-drug interaction that may occur.  Aleksa Catterton DO  10/09/2015 10:44 AM

## 2015-10-11 DIAGNOSIS — I7300 Raynaud's syndrome without gangrene: Secondary | ICD-10-CM | POA: Diagnosis not present

## 2015-10-11 DIAGNOSIS — M0579 Rheumatoid arthritis with rheumatoid factor of multiple sites without organ or systems involvement: Secondary | ICD-10-CM | POA: Diagnosis not present

## 2015-10-11 DIAGNOSIS — M2550 Pain in unspecified joint: Secondary | ICD-10-CM | POA: Diagnosis not present

## 2015-10-12 DIAGNOSIS — M4806 Spinal stenosis, lumbar region: Secondary | ICD-10-CM | POA: Diagnosis not present

## 2015-10-19 DIAGNOSIS — M4806 Spinal stenosis, lumbar region: Secondary | ICD-10-CM | POA: Diagnosis not present

## 2015-10-25 DIAGNOSIS — M4806 Spinal stenosis, lumbar region: Secondary | ICD-10-CM | POA: Diagnosis not present

## 2015-10-25 DIAGNOSIS — M545 Low back pain: Secondary | ICD-10-CM | POA: Diagnosis not present

## 2015-11-02 ENCOUNTER — Other Ambulatory Visit: Payer: Self-pay | Admitting: Orthopedic Surgery

## 2015-11-02 DIAGNOSIS — M545 Low back pain: Principal | ICD-10-CM

## 2015-11-02 DIAGNOSIS — G8929 Other chronic pain: Secondary | ICD-10-CM

## 2015-11-16 ENCOUNTER — Other Ambulatory Visit: Payer: BC Managed Care – PPO

## 2015-11-22 ENCOUNTER — Ambulatory Visit
Admission: RE | Admit: 2015-11-22 | Discharge: 2015-11-22 | Disposition: A | Discharge status: Home / Self Care | Payer: Medicare Other | Source: Ambulatory Visit | Attending: Orthopedic Surgery | Admitting: Orthopedic Surgery

## 2015-11-22 ENCOUNTER — Other Ambulatory Visit: Payer: Self-pay | Admitting: Orthopedic Surgery

## 2015-11-22 ENCOUNTER — Inpatient Hospital Stay
Admission: RE | Admit: 2015-11-22 | Discharge: 2015-11-22 | Disposition: A | Discharge status: Home / Self Care | Payer: Self-pay | Source: Ambulatory Visit | Attending: Orthopedic Surgery | Admitting: Orthopedic Surgery

## 2015-11-22 VITALS — BP 129/71 | HR 66

## 2015-11-22 DIAGNOSIS — G8929 Other chronic pain: Secondary | ICD-10-CM

## 2015-11-22 DIAGNOSIS — M48062 Spinal stenosis, lumbar region with neurogenic claudication: Secondary | ICD-10-CM

## 2015-11-22 DIAGNOSIS — M4806 Spinal stenosis, lumbar region: Secondary | ICD-10-CM | POA: Diagnosis not present

## 2015-11-22 DIAGNOSIS — R52 Pain, unspecified: Secondary | ICD-10-CM

## 2015-11-22 DIAGNOSIS — M545 Low back pain, unspecified: Secondary | ICD-10-CM

## 2015-11-22 MED ORDER — IOHEXOL 180 MG/ML  SOLN
15.0000 mL | Freq: Once | INTRAMUSCULAR | Status: AC | PRN
  Administered 2015-11-22: 15 mL via INTRATHECAL

## 2015-11-22 MED ORDER — DIAZEPAM 5 MG PO TABS
5.0000 mg | ORAL_TABLET | Freq: Once | ORAL | Status: AC
  Administered 2015-11-22: 5 mg via ORAL

## 2015-11-22 MED ORDER — ONDANSETRON HCL 4 MG/2ML IJ SOLN
4.0000 mg | Freq: Once | INTRAMUSCULAR | Status: AC
  Administered 2015-11-22: 4 mg via INTRAMUSCULAR

## 2015-11-22 MED ORDER — MEPERIDINE HCL 100 MG/ML IJ SOLN
75.0000 mg | Freq: Once | INTRAMUSCULAR | Status: AC
  Administered 2015-11-22: 75 mg via INTRAMUSCULAR

## 2015-11-22 NOTE — Discharge Instructions (Signed)

## 2015-12-02 ENCOUNTER — Encounter: Payer: Self-pay | Admitting: Family Medicine

## 2015-12-06 DIAGNOSIS — M545 Low back pain: Secondary | ICD-10-CM | POA: Diagnosis not present

## 2015-12-06 DIAGNOSIS — M4806 Spinal stenosis, lumbar region: Secondary | ICD-10-CM | POA: Diagnosis not present

## 2015-12-06 DIAGNOSIS — M5126 Other intervertebral disc displacement, lumbar region: Secondary | ICD-10-CM | POA: Diagnosis not present

## 2015-12-07 ENCOUNTER — Encounter: Payer: Self-pay | Admitting: Family Medicine

## 2015-12-19 ENCOUNTER — Ambulatory Visit (INDEPENDENT_AMBULATORY_CARE_PROVIDER_SITE_OTHER): Payer: Medicare Other | Admitting: Family Medicine

## 2015-12-19 VITALS — BP 122/62 | HR 80 | Temp 98.2°F | Resp 16 | Ht 64.5 in | Wt 223.0 lb

## 2015-12-19 DIAGNOSIS — Z01818 Encounter for other preprocedural examination: Secondary | ICD-10-CM

## 2015-12-19 DIAGNOSIS — R42 Dizziness and giddiness: Secondary | ICD-10-CM

## 2015-12-19 LAB — POCT CBC
GRANULOCYTE PERCENT: 68.7 % (ref 37–80)
HEMATOCRIT: 37 % — AB (ref 43.5–53.7)
Hemoglobin: 12 g/dL — AB (ref 14.1–18.1)
Lymph, poc: 1.5 (ref 0.6–3.4)
MCH: 30.9 pg (ref 27–31.2)
MCHC: 32.3 g/dL (ref 31.8–35.4)
MCV: 95.6 fL (ref 80–97)
MID (CBC): 0.5 (ref 0–0.9)
MPV: 5.8 fL (ref 0–99.8)
POC Granulocyte: 4.4 (ref 2–6.9)
POC LYMPH PERCENT: 23.4 %L (ref 10–50)
POC MID %: 7.9 % (ref 0–12)
Platelet Count, POC: 311 10*3/uL (ref 142–424)
RBC: 3.87 M/uL — AB (ref 4.69–6.13)
RDW, POC: 18.9 %
WBC: 6.4 10*3/uL (ref 4.6–10.2)

## 2015-12-19 LAB — COMPLETE METABOLIC PANEL WITH GFR
ALBUMIN: 3.8 g/dL (ref 3.6–5.1)
ALK PHOS: 131 U/L — AB (ref 40–115)
ALT: 12 U/L (ref 9–46)
AST: 15 U/L (ref 10–35)
BUN: 17 mg/dL (ref 7–25)
CALCIUM: 9 mg/dL (ref 8.6–10.3)
CO2: 30 mmol/L (ref 20–31)
Chloride: 102 mmol/L (ref 98–110)
Creat: 1.1 mg/dL (ref 0.70–1.18)
GFR, EST AFRICAN AMERICAN: 78 mL/min (ref >=60)
GFR, EST NON AFRICAN AMERICAN: 67 mL/min (ref >=60)
Glucose, Bld: 89 mg/dL (ref 65–99)
POTASSIUM: 4 mmol/L (ref 3.5–5.3)
Sodium: 141 mmol/L (ref 135–146)
Total Bilirubin: 0.3 mg/dL (ref 0.2–1.2)
Total Protein: 7.1 g/dL (ref 6.1–8.1)

## 2015-12-19 NOTE — Patient Instructions (Signed)
Your doctor has access to your lab and EKG results.  At this time, everything looks like it is at it's baseline.  I see no increased risk of having this specific procedure.  However, I still recommend that they do the appropriate blood work including EKG, CMET, and EKG just prior to the procedure.

## 2015-12-19 NOTE — Progress Notes (Signed)
[facility] [address], [place] [address] [phone]  Date:  12/19/2015   Name:  Bryan [name]   DOB:  [birth year]   MRN:  [id number]  PCP:  Lamar Blinks, MD    History of Present Illness:  Bryan [name] is a 70 y.o. male patient who presents to [facility] for surgical clearance.  He is to have spinal procedure, and Dr. Gladstone Lighter has requested a clearance from his provider.   Patient reports no current complaints or concerns.  He denies hx of CVA, cardiovascular events, or clotting hx.  HTN is well controlled at this time.  He denies chest pains, palpitations, leg swellings, or sob.  He has had occasional dizziness, a chronic dysequilibrium that "comes with age".  This phenom is unchanged.      Patient Active Problem List   Diagnosis Date Noted  . Spinal stenosis, lumbar region, with neurogenic claudication 02/15/2015  . Acute cholecystitis with chronic cholecystitis s/p lap LOA/chole 12/07/2013 12/07/2013  . Obesity (BMI 30-39.9) 12/07/2013  . Refusal of blood transfusions as patient is Jehovah's Witness 12/07/2013  . Rheumatoid arthritis ([place]) 10/26/2012  . Nonspecific abnormal finding in stool contents 07/28/2012  . Multiple joint pain 06/05/2012  . History of tobacco use-  06/05/2012  . Asthmatic bronchitis 12/15/2011  . BPH (benign prostatic hyperplasia) 12/15/2011  . Essential tremor 12/15/2011  . HTN (hypertension) 12/15/2011    Past Medical History  Diagnosis Date  . Obesity   . HTN (hypertension)   . BPH (benign prostatic hypertrophy)   . Seizures (HCC)     AS A CHILD.  [name] Ulcer   . Hearing deficit     wears hearing aids bilateral  . GERD (gastroesophageal reflux disease)     reports for indigestion he uses mustard   . Arthritis     hands, knees, cervical area  . Essential tremor   . Sleep apnea     cpap - settings at 9 per patient     Past Surgical History  Procedure Laterality Date  . Tonsillectomy    . Eye surgery      right,  growth excision  . Colonoscopy    . Vasectomy    . Anterior cervical decomp/discectomy fusion N/A 03/30/2013    Procedure: ANTERIOR CERVICAL DECOMPRESSION/DISCECTOMY FUSION 2 LEVELS;  Surgeon: Otilio Connors, MD;  Location: [place] [facility];  Service: Neurosurgery;  Laterality: N/A;  C4-5 C5-6 Anterior cervical decompression/diskectomy/fusion/Allograft/Plate  . Spine surgery    . Wrist ganglion excision Left   . Cholecystectomy N/A 12/07/2013    Procedure: LAPAROSCOPIC CHOLECYSTECTOMY WITH INTRAOPERATIVE CHOLANGIOGRAM;  Surgeon: Adin Hector, MD;  Location: [facility];  Service: General;  Laterality: N/A;  . Decompressive lumbar laminectomy level 2 N/A 02/15/2015    Procedure: COMPLETE DECOMPRESSIVE LUMBAR LAMINECTOMY L4-L5/ FORAMINOTOMY TO L4 NERVE ROOT AND L5 NERVE ROOT BILATERALLY;  Surgeon: [name], MD;  Location: [facility];  Service: Orthopedics;  Laterality: N/A;    Social History  Substance Use Topics  . Smoking status: Former Smoker    Types: Cigarettes    Quit date: 12/14/1972  . Smokeless tobacco: Never Used  . Alcohol Use: Yes     Comment: occasionally    Family History  Problem Relation Age of Onset  . Colon cancer Maternal Grandfather   . Diabetes Paternal Aunt     x 4 aunts  . Kidney disease Brother   . Stroke Maternal Grandmother     Allergies  Allergen Reactions  .  Other Other (See Comments)    BLOOD PRODUCT REFUSAL (patient is a Jehovah's Witness)    Medication list has been reviewed and updated.  Current Outpatient Prescriptions on File Prior to Visit  Medication Sig Dispense Refill  . aspirin EC 81 MG tablet Take 81 mg by mouth daily.    [name] HYDROcodone-acetaminophen (NORCO/VICODIN) 5-325 MG tablet Take 1-2 tablets by mouth every 4 (four) hours as needed for moderate pain or severe pain. 15 tablet 0  . methocarbamol (ROBAXIN) 500 MG tablet Take 500 mg by mouth 2 (two) times daily.    . Methotrexate, PF, 25 MG/0.4ML SOAJ Inject into the skin.    [name] primidone  (MYSOLINE) 50 MG tablet Take 50 mg by mouth 2 (two) times daily.     . Tamsulosin HCl (FLOMAX) 0.4 MG CAPS Take 0.4 mg by mouth at bedtime.     . Albuterol Sulfate (PROAIR RESPICLICK) 123XX123 (90 BASE) MCG/ACT AEPB Inhale 2 puffs into the lungs every 4 (four) hours as needed. (Patient not taking: Reported on 10/09/2015) 1 each 0  . modafinil (PROVIGIL) 200 MG tablet Take 200 mg by mouth daily. Reported on 12/19/2015     No current facility-administered medications on file prior to visit.    ROS ROS otherwise unremarkable unless listed above.   Physical Examination: BP 122/62 mmHg  Pulse 80  Temp(Src) 98.2 F (36.8 C) (Oral)  Resp 16  Ht 5' 4.5" (1.638 m)  Wt 223 lb (101.152 kg)  BMI 37.70 kg/m2  SpO2 97% Ideal Body Weight: Weight in (lb) to have BMI = 25: 147.6 Wt Readings from Last 3 Encounters:  12/19/15 223 lb (101.152 kg)  10/09/15 223 lb (101.152 kg)  09/10/15 220 lb 2 oz (99.848 kg)    Physical Exam  Constitutional: He is oriented to person, place, and time. He appears well-developed and well-nourished. No distress.  HENT:  Head: Normocephalic and atraumatic.  Eyes: Conjunctivae and EOM are normal. Pupils are equal, round, and reactive to light.  Cardiovascular: Normal rate and normal heart sounds.  An irregular rhythm present. Exam reveals no gallop and no friction rub.   No murmur heard. Pulses:      Radial pulses are 2+ on the right side, and 2+ on the left side.       Dorsalis pedis pulses are 2+ on the right side, and 2+ on the left side.  Pulmonary/Chest: Effort normal. No respiratory distress. He has no decreased breath sounds. He has no wheezes. He has no rhonchi.  Lymphadenopathy:    He has no cervical adenopathy.  Neurological: He is alert and oriented to person, place, and time.  Skin: Skin is warm and dry. He is not diaphoretic.  Psychiatric: He has a normal mood and affect. His behavior is normal.    Results for orders placed or performed in visit on  12/19/15  COMPLETE METABOLIC PANEL WITH GFR  Result Value Ref Range   Sodium 141 135 - 146 mmol/L   Potassium 4.0 3.5 - 5.3 mmol/L   Chloride 102 98 - 110 mmol/L   CO2 30 20 - 31 mmol/L   Glucose, Bld 89 65 - 99 mg/dL   BUN 17 7 - 25 mg/dL   Creat 1.10 0.70 - 1.18 mg/dL   Total Bilirubin 0.3 0.2 - 1.2 mg/dL   Alkaline Phosphatase 131 (H) 40 - 115 U/L   AST 15 10 - 35 U/L   ALT 12 9 - 46 U/L   Total Protein 7.1 6.1 -  8.1 g/dL   Albumin 3.8 3.6 - 5.1 g/dL   Calcium 9.0 8.6 - 10.3 mg/dL   GFR, Est African American 78 >=60 mL/min   GFR, Est Non African American 67 >=60 mL/min  POCT CBC  Result Value Ref Range   WBC 6.4 4.6 - 10.2 K/uL   Lymph, poc 1.5 0.6 - 3.4   POC LYMPH PERCENT 23.4 10 - 50 %L   MID (cbc) 0.5 0 - 0.9   POC MID % 7.9 0 - 12 %M   POC Granulocyte 4.4 2 - 6.9   Granulocyte percent 68.7 37 - 80 %G   RBC 3.87 (A) 4.69 - 6.13 M/uL   Hemoglobin 12.0 (A) 14.1 - 18.1 g/dL   HCT, POC 37 (A) 43.5 - 53.7 %   MCV 95.6 80 - 97 fL   MCH, POC 30.9 27 - 31.2 pg   MCHC 32.3 31.8 - 35.4 g/dL   RDW, POC 18.9 %   Platelet Count, POC 311 142 - 424 K/uL   MPV 5.8 0 - 99.8 fL     EKG unchanged from prior   Assessment and Plan: Bryan [name] is a 70 y.o. male who is here today for surgical clearance. Vitals wnl.  Cbc unremarkably different from prior. (12 from 12.6).  At this time, there are not findings that place him at high risk for this procedure.  It is suggested that repeat blood work, and EKG just prior to procedure is recommended.    Preoperative clearance - Plan: EKG 12-Lead, COMPLETE METABOLIC PANEL WITH GFR, POCT CBC  Dizzy - Plan: EKG 12-Lead, COMPLETE METABOLIC PANEL WITH GFR, POCT CBC, CANCELED: CBC  Ivar Drape, PA-C [facility] 2/7/20177:06 AM

## 2015-12-20 NOTE — Progress Notes (Signed)
Patient ID: Bryan [name], male   DOB: 10/03/1946, 70 y.o.   MRN: [id number] Reviewed documentation and EKG and agree w/ assessment and plan. Delman Cheadle, MD MPH

## 2015-12-27 ENCOUNTER — Telehealth: Payer: Self-pay | Admitting: Family Medicine

## 2015-12-27 NOTE — Telephone Encounter (Signed)
Called about pre-op form- completed per Dr Brigitte Pulse

## 2015-12-31 ENCOUNTER — Telehealth: Payer: Self-pay | Admitting: Radiology

## 2015-12-31 NOTE — Telephone Encounter (Signed)
Patient has requested his letter of clearance for his upcoming surgery, I have provided him the AVS from his last visit. His doctor does not have access to our records. I have asked him to call us back with the fax number for Dr [facility] surgical scheduler and we will be happy to fax the notes giving clearance from [facility]

## 2016-01-02 ENCOUNTER — Telehealth: Payer: Self-pay

## 2016-01-02 NOTE — Telephone Encounter (Signed)
The fax number for [facility] is [phone].

## 2016-01-03 ENCOUNTER — Other Ambulatory Visit: Payer: Self-pay | Admitting: Surgical

## 2016-01-06 ENCOUNTER — Ambulatory Visit (HOSPITAL_COMMUNITY)
Admission: RE | Admit: 2016-01-06 | Discharge: 2016-01-06 | Disposition: A | Discharge status: Home / Self Care | Payer: Medicare Other | Source: Ambulatory Visit | Attending: Surgical | Admitting: Surgical

## 2016-01-06 ENCOUNTER — Encounter (HOSPITAL_COMMUNITY)
Admission: RE | Admit: 2016-01-06 | Discharge: 2016-01-06 | Disposition: A | Discharge status: Home / Self Care | Payer: Medicare Other | Source: Ambulatory Visit | Attending: Orthopedic Surgery | Admitting: Orthopedic Surgery

## 2016-01-06 ENCOUNTER — Encounter (HOSPITAL_COMMUNITY): Payer: Self-pay

## 2016-01-06 DIAGNOSIS — M5136 Other intervertebral disc degeneration, lumbar region: Secondary | ICD-10-CM | POA: Diagnosis not present

## 2016-01-06 DIAGNOSIS — Z01818 Encounter for other preprocedural examination: Secondary | ICD-10-CM | POA: Insufficient documentation

## 2016-01-06 DIAGNOSIS — M419 Scoliosis, unspecified: Secondary | ICD-10-CM | POA: Insufficient documentation

## 2016-01-06 DIAGNOSIS — M4316 Spondylolisthesis, lumbar region: Secondary | ICD-10-CM | POA: Insufficient documentation

## 2016-01-06 HISTORY — DX: Reserved for inherently not codable concepts without codable children: IMO0001

## 2016-01-06 HISTORY — DX: Unspecified right bundle-branch block: I45.10

## 2016-01-06 HISTORY — DX: Procedure and treatment not carried out because of patient's decision for reasons of belief and group pressure: Z53.1

## 2016-01-06 HISTORY — DX: Anemia, unspecified: D64.9

## 2016-01-06 LAB — SURGICAL PCR SCREEN
MRSA, PCR: NEGATIVE
Staphylococcus aureus: NEGATIVE

## 2016-01-06 LAB — NO BLOOD PRODUCTS

## 2016-01-06 LAB — PROTIME-INR
INR: 1.16 (ref 0.00–1.49)
Prothrombin Time: 14.6 seconds (ref 11.6–15.2)

## 2016-01-06 LAB — APTT: aPTT: 28 seconds (ref 24–37)

## 2016-01-06 NOTE — Pre-Procedure Instructions (Signed)
01-06-16 EKG 2'17, CXR 8'16, CBC/d,CMP 12-19-15 with chart-(Clearance Dr. Lorelei Pont) [place].

## 2016-01-06 NOTE — Patient Instructions (Signed)
Bryan [name]  01/06/2016   Your procedure is scheduled on: 01-19-16  Report to [facility] Main  Entrance take [facility]  elevators to 3rd floor to  [place] at  0900  AM.  Call this number if you have problems the morning of surgery [phone]   Remember: ONLY 1 PERSON MAY GO WITH YOU TO SHORT STAY TO GET  READY MORNING OF [place].  Do not eat food or drink liquids :After Midnight.    Take these medicines the morning of surgery with A SIP OF WATER: Primodone. Bring cpap mask/tubing.  DO NOT TAKE ANY DIABETIC MEDICATIONS DAY OF YOUR SURGERY                               You may not have any metal on your body including hair pins and              piercings  Do not wear jewelry, make-up, lotions, powders or perfumes, deodorant             Do not wear nail polish.  Do not shave  48 hours prior to surgery.              Men may shave face and neck.   Do not bring valuables to the hospital. [place].  Contacts, dentures or bridgework may not be worn into surgery.  Leave suitcase in the car. After surgery it may be brought to your room.     Patients discharged the day of surgery will not be allowed to drive home.  Name and phone number of your driver: [place] spouse -[phone] cell  Special Instructions: N/A              Please read over the following fact sheets you were given: _____________________________________________________________________             [facility] - Preparing for Surgery Before surgery, you can play an important role.  Because skin is not sterile, your skin needs to be as free of germs as possible.  You can reduce the number of germs on your skin by washing with CHG (chlorahexidine gluconate) soap before surgery.  CHG is an antiseptic cleaner which kills germs and bonds with the skin to continue killing germs even after washing. Please DO NOT use if you have an allergy to CHG  or antibacterial soaps.  If your skin becomes reddened/irritated stop using the CHG and inform your nurse when you arrive at Short Stay. Do not shave (including legs and underarms) for at least 48 hours prior to the first CHG shower.  You may shave your face/neck. Please follow these instructions carefully:  1.  Shower with CHG Soap the night before surgery and the  morning of Surgery.  2.  If you choose to wash your hair, wash your hair first as usual with your  normal  shampoo.  3.  After you shampoo, rinse your hair and body thoroughly to remove the  shampoo.                           4.  Use CHG as you would any other liquid soap.  You can apply chg  directly  to the skin and wash                       Gently with a scrungie or clean washcloth.  5.  Apply the CHG Soap to your body ONLY FROM THE NECK DOWN.   Do not use on face/ open                           Wound or open sores. Avoid contact with eyes, ears mouth and genitals (private parts).                       Wash face,  Genitals (private parts) with your normal soap.             6.  Wash thoroughly, paying special attention to the area where your surgery  will be performed.  7.  Thoroughly rinse your body with warm water from the neck down.  8.  DO NOT shower/wash with your normal soap after using and rinsing off  the CHG Soap.                9.  Pat yourself dry with a clean towel.            10.  Wear clean pajamas.            11.  Place clean sheets on your bed the night of your first shower and do not  sleep with pets. Day of Surgery : Do not apply any lotions/deodorants the morning of surgery.  Please wear clean clothes to the hospital/surgery center.  FAILURE TO FOLLOW THESE INSTRUCTIONS MAY RESULT IN THE CANCELLATION OF YOUR SURGERY PATIENT SIGNATURE_________________________________  NURSE SIGNATURE__________________________________  ________________________________________________________________________   Adam Phenix  An incentive spirometer is a tool that can help keep your lungs clear and active. This tool measures how well you are filling your lungs with each breath. Taking long deep breaths may help reverse or decrease the chance of developing breathing (pulmonary) problems (especially infection) following:  A long period of time when you are unable to move or be active. BEFORE THE PROCEDURE   If the spirometer includes an indicator to show your best effort, your nurse or respiratory therapist will set it to a desired goal.  If possible, sit up straight or lean slightly forward. Try not to slouch.  Hold the incentive spirometer in an upright position. INSTRUCTIONS FOR USE  1. Sit on the edge of your bed if possible, or sit up as far as you can in bed or on a chair. 2. Hold the incentive spirometer in an upright position. 3. Breathe out normally. 4. Place the mouthpiece in your mouth and seal your lips tightly around it. 5. Breathe in slowly and as deeply as possible, raising the piston or the ball toward the top of the column. 6. Hold your breath for 3-5 seconds or for as long as possible. Allow the piston or ball to fall to the bottom of the column. 7. Remove the mouthpiece from your mouth and breathe out normally. 8. Rest for a few seconds and repeat Steps 1 through 7 at least 10 times every 1-2 hours when you are awake. Take your time and take a few normal breaths between deep breaths. 9. The spirometer may include an indicator to show your best effort. Use the indicator as a goal to work toward during each repetition. 10.  After each set of 10 deep breaths, practice coughing to be sure your lungs are clear. If you have an incision (the cut made at the time of surgery), support your incision when coughing by placing a pillow or rolled up towels firmly against it. Once you are able to get out of bed, walk around indoors and cough well. You may stop using the incentive spirometer when  instructed by your caregiver.  RISKS AND COMPLICATIONS  Take your time so you do not get dizzy or light-headed.  If you are in pain, you may need to take or ask for pain medication before doing incentive spirometry. It is harder to take a deep breath if you are having pain. AFTER USE  Rest and breathe slowly and easily.  It can be helpful to keep track of a log of your progress. Your caregiver can provide you with a simple table to help with this. If you are using the spirometer at home, follow these instructions: [place] IF:   You are having difficultly using the spirometer.  You have trouble using the spirometer as often as instructed.  Your pain medication is not giving enough relief while using the spirometer.  You develop fever of 100.5 F (38.1 C) or higher. SEEK IMMEDIATE MEDICAL CARE IF:   You cough up bloody sputum that had not been present before.  You develop fever of 102 F (38.9 C) or greater.  You develop worsening pain at or near the incision site. MAKE SURE YOU:   Understand these instructions.  Will watch your condition.  Will get help right away if you are not doing well or get worse. Document Released: 03/11/2007 Document Revised: 01/21/2012 Document Reviewed: 05/12/2007 [facility] Patient Information 2014 [place], [place].   ________________________________________________________________________

## 2016-01-18 ENCOUNTER — Encounter (HOSPITAL_COMMUNITY)
Admission: AD | Disposition: A | Discharge status: Home / Self Care | Payer: Self-pay | Source: Ambulatory Visit | Attending: Orthopedic Surgery

## 2016-01-18 ENCOUNTER — Observation Stay (HOSPITAL_COMMUNITY)
Admission: AD | Admit: 2016-01-18 | Discharge: 2016-01-20 | Disposition: A | Discharge status: Home / Self Care | Payer: Medicare Other | Source: Ambulatory Visit | Attending: Orthopedic Surgery | Admitting: Orthopedic Surgery

## 2016-01-18 ENCOUNTER — Ambulatory Visit (HOSPITAL_COMMUNITY): Payer: Medicare Other

## 2016-01-18 ENCOUNTER — Encounter (HOSPITAL_COMMUNITY): Payer: Self-pay | Admitting: *Deleted

## 2016-01-18 ENCOUNTER — Ambulatory Visit (HOSPITAL_COMMUNITY): Payer: Medicare Other | Admitting: Certified Registered Nurse Anesthetist

## 2016-01-18 DIAGNOSIS — Z7982 Long term (current) use of aspirin: Secondary | ICD-10-CM | POA: Diagnosis not present

## 2016-01-18 DIAGNOSIS — G4730 Sleep apnea, unspecified: Secondary | ICD-10-CM | POA: Insufficient documentation

## 2016-01-18 DIAGNOSIS — N400 Enlarged prostate without lower urinary tract symptoms: Secondary | ICD-10-CM | POA: Diagnosis not present

## 2016-01-18 DIAGNOSIS — I10 Essential (primary) hypertension: Secondary | ICD-10-CM | POA: Diagnosis not present

## 2016-01-18 DIAGNOSIS — M4806 Spinal stenosis, lumbar region: Secondary | ICD-10-CM | POA: Insufficient documentation

## 2016-01-18 DIAGNOSIS — H9193 Unspecified hearing loss, bilateral: Secondary | ICD-10-CM | POA: Insufficient documentation

## 2016-01-18 DIAGNOSIS — M069 Rheumatoid arthritis, unspecified: Secondary | ICD-10-CM | POA: Diagnosis not present

## 2016-01-18 DIAGNOSIS — Z6836 Body mass index (BMI) 36.0-36.9, adult: Secondary | ICD-10-CM | POA: Insufficient documentation

## 2016-01-18 DIAGNOSIS — Z419 Encounter for procedure for purposes other than remedying health state, unspecified: Secondary | ICD-10-CM

## 2016-01-18 DIAGNOSIS — Z87891 Personal history of nicotine dependence: Secondary | ICD-10-CM | POA: Diagnosis not present

## 2016-01-18 DIAGNOSIS — K219 Gastro-esophageal reflux disease without esophagitis: Secondary | ICD-10-CM | POA: Diagnosis not present

## 2016-01-18 DIAGNOSIS — M48062 Spinal stenosis, lumbar region with neurogenic claudication: Secondary | ICD-10-CM | POA: Diagnosis present

## 2016-01-18 DIAGNOSIS — M5126 Other intervertebral disc displacement, lumbar region: Secondary | ICD-10-CM | POA: Diagnosis not present

## 2016-01-18 DIAGNOSIS — I4510 Unspecified right bundle-branch block: Secondary | ICD-10-CM | POA: Diagnosis not present

## 2016-01-18 DIAGNOSIS — Z79899 Other long term (current) drug therapy: Secondary | ICD-10-CM | POA: Diagnosis not present

## 2016-01-18 DIAGNOSIS — J45909 Unspecified asthma, uncomplicated: Secondary | ICD-10-CM | POA: Diagnosis not present

## 2016-01-18 DIAGNOSIS — Z9049 Acquired absence of other specified parts of digestive tract: Secondary | ICD-10-CM | POA: Diagnosis not present

## 2016-01-18 DIAGNOSIS — E669 Obesity, unspecified: Secondary | ICD-10-CM | POA: Diagnosis not present

## 2016-01-18 DIAGNOSIS — G250 Essential tremor: Secondary | ICD-10-CM | POA: Insufficient documentation

## 2016-01-18 DIAGNOSIS — Z9889 Other specified postprocedural states: Secondary | ICD-10-CM | POA: Diagnosis not present

## 2016-01-18 HISTORY — PX: LUMBAR LAMINECTOMY/DECOMPRESSION MICRODISCECTOMY: SHX5026

## 2016-01-18 SURGERY — LUMBAR LAMINECTOMY/DECOMPRESSION MICRODISCECTOMY 1 LEVEL
Anesthesia: General | Site: Back | Laterality: Left

## 2016-01-18 MED ORDER — BACITRACIN-NEOMYCIN-POLYMYXIN 400-5-5000 EX OINT
TOPICAL_OINTMENT | CUTANEOUS | Status: DC | PRN
  Administered 2016-01-18: 1 via TOPICAL

## 2016-01-18 MED ORDER — BACITRACIN-NEOMYCIN-POLYMYXIN 400-5-5000 EX OINT
TOPICAL_OINTMENT | CUTANEOUS | Status: AC
  Filled 2016-01-18: qty 1

## 2016-01-18 MED ORDER — LACTATED RINGERS IV SOLN: INTRAVENOUS | Status: DC

## 2016-01-18 MED ORDER — CHLORHEXIDINE GLUCONATE 4 % EX LIQD: 60.0000 mL | Freq: Once | CUTANEOUS | Status: DC

## 2016-01-18 MED ORDER — ONDANSETRON HCL 4 MG/2ML IJ SOLN
4.0000 mg | INTRAMUSCULAR | Status: DC | PRN
  Administered 2016-01-18 – 2016-01-20 (×2): 4 mg via INTRAVENOUS
  Filled 2016-01-18 (×2): qty 2

## 2016-01-18 MED ORDER — FLEET ENEMA 7-19 GM/118ML RE ENEM: 1.0000 | ENEMA | Freq: Once | RECTAL | Status: DC | PRN

## 2016-01-18 MED ORDER — HYDROMORPHONE HCL 1 MG/ML IJ SOLN
0.2500 mg | INTRAMUSCULAR | Status: DC | PRN
  Administered 2016-01-18 (×2): 0.5 mg via INTRAVENOUS

## 2016-01-18 MED ORDER — ESMOLOL HCL 100 MG/10ML IV SOLN
INTRAVENOUS | Status: AC
  Filled 2016-01-18: qty 10

## 2016-01-18 MED ORDER — METHOTREXATE SODIUM CHEMO INJECTION 250 MG/10ML
15.0000 mg | INTRAMUSCULAR | Status: DC
  Filled 2016-01-18: qty 0.6

## 2016-01-18 MED ORDER — METOPROLOL TARTRATE 50 MG PO TABS
50.0000 mg | ORAL_TABLET | Freq: Once | ORAL | Status: AC
  Administered 2016-01-18: 50 mg via ORAL
  Filled 2016-01-18: qty 1

## 2016-01-18 MED ORDER — SODIUM CHLORIDE 0.9 % IR SOLN
Status: AC
  Filled 2016-01-18: qty 1

## 2016-01-18 MED ORDER — METHOCARBAMOL 1000 MG/10ML IJ SOLN
500.0000 mg | Freq: Four times a day (QID) | INTRAVENOUS | Status: DC | PRN
  Administered 2016-01-18: 500 mg via INTRAVENOUS
  Filled 2016-01-18 (×2): qty 5

## 2016-01-18 MED ORDER — METHOCARBAMOL 500 MG PO TABS
500.0000 mg | ORAL_TABLET | Freq: Two times a day (BID) | ORAL | Status: DC
  Administered 2016-01-18 – 2016-01-20 (×4): 500 mg via ORAL
  Filled 2016-01-18 (×5): qty 1

## 2016-01-18 MED ORDER — POLYETHYLENE GLYCOL 3350 17 G PO PACK: 17.0000 g | PACK | Freq: Every day | ORAL | Status: DC | PRN

## 2016-01-18 MED ORDER — GELATIN ABSORBABLE MT POWD
OROMUCOSAL | Status: DC | PRN
  Administered 2016-01-18: 10 mL via TOPICAL

## 2016-01-18 MED ORDER — FENTANYL CITRATE (PF) 250 MCG/5ML IJ SOLN
INTRAMUSCULAR | Status: AC
  Filled 2016-01-18: qty 5

## 2016-01-18 MED ORDER — MENTHOL 3 MG MT LOZG: 1.0000 | LOZENGE | OROMUCOSAL | Status: DC | PRN

## 2016-01-18 MED ORDER — HYDROCODONE-ACETAMINOPHEN 5-325 MG PO TABS
1.0000 | ORAL_TABLET | ORAL | Status: DC | PRN
  Administered 2016-01-18: 2 via ORAL
  Filled 2016-01-18: qty 2

## 2016-01-18 MED ORDER — PROPOFOL 10 MG/ML IV BOLUS
INTRAVENOUS | Status: AC
  Filled 2016-01-18: qty 20

## 2016-01-18 MED ORDER — BUPIVACAINE-EPINEPHRINE 0.5% -1:200000 IJ SOLN
INTRAMUSCULAR | Status: DC | PRN
Start: 2016-01-18 — End: 2016-01-18
  Administered 2016-01-18: 20 mL

## 2016-01-18 MED ORDER — EPHEDRINE SULFATE 50 MG/ML IJ SOLN
INTRAMUSCULAR | Status: DC | PRN
  Administered 2016-01-18: 10 mg via INTRAVENOUS

## 2016-01-18 MED ORDER — BUPIVACAINE-EPINEPHRINE (PF) 0.5% -1:200000 IJ SOLN
INTRAMUSCULAR | Status: AC
  Filled 2016-01-18: qty 30

## 2016-01-18 MED ORDER — SODIUM CHLORIDE 0.9 % IJ SOLN
INTRAMUSCULAR | Status: AC
  Filled 2016-01-18: qty 10

## 2016-01-18 MED ORDER — ROCURONIUM BROMIDE 100 MG/10ML IV SOLN
INTRAVENOUS | Status: AC
  Filled 2016-01-18: qty 1

## 2016-01-18 MED ORDER — PROMETHAZINE HCL 25 MG/ML IJ SOLN
12.5000 mg | Freq: Four times a day (QID) | INTRAMUSCULAR | Status: DC | PRN
  Administered 2016-01-18: 12.5 mg via INTRAVENOUS
  Filled 2016-01-18: qty 1

## 2016-01-18 MED ORDER — 0.9 % SODIUM CHLORIDE (POUR BTL) OPTIME
TOPICAL | Status: DC | PRN
  Administered 2016-01-18: 1000 mL

## 2016-01-18 MED ORDER — TAMSULOSIN HCL 0.4 MG PO CAPS
0.4000 mg | ORAL_CAPSULE | Freq: Every day | ORAL | Status: DC
  Administered 2016-01-18 – 2016-01-19 (×2): 0.4 mg via ORAL
  Filled 2016-01-18 (×3): qty 1

## 2016-01-18 MED ORDER — LIDOCAINE HCL (CARDIAC) 20 MG/ML IV SOLN
INTRAVENOUS | Status: AC
  Filled 2016-01-18: qty 5

## 2016-01-18 MED ORDER — CEFAZOLIN SODIUM-DEXTROSE 2-3 GM-% IV SOLR
2.0000 g | INTRAVENOUS | Status: AC
  Administered 2016-01-18: 2 g via INTRAVENOUS

## 2016-01-18 MED ORDER — LACTATED RINGERS IV SOLN
INTRAVENOUS | Status: DC
  Administered 2016-01-18: 13:00:00 via INTRAVENOUS
  Administered 2016-01-18: 1000 mL via INTRAVENOUS

## 2016-01-18 MED ORDER — CEFAZOLIN SODIUM 1-5 GM-% IV SOLN
1.0000 g | Freq: Three times a day (TID) | INTRAVENOUS | Status: AC
  Administered 2016-01-18 – 2016-01-19 (×3): 1 g via INTRAVENOUS
  Filled 2016-01-18 (×3): qty 50

## 2016-01-18 MED ORDER — LABETALOL HCL 5 MG/ML IV SOLN
INTRAVENOUS | Status: AC
  Filled 2016-01-18: qty 4

## 2016-01-18 MED ORDER — ROCURONIUM BROMIDE 100 MG/10ML IV SOLN
INTRAVENOUS | Status: DC | PRN
  Administered 2016-01-18: 10 mg via INTRAVENOUS
  Administered 2016-01-18 (×3): 5 mg via INTRAVENOUS
  Administered 2016-01-18: 30 mg via INTRAVENOUS
  Administered 2016-01-18: 10 mg via INTRAVENOUS
  Administered 2016-01-18: 5 mg via INTRAVENOUS
  Administered 2016-01-18: 10 mg via INTRAVENOUS

## 2016-01-18 MED ORDER — OXYCODONE HCL 5 MG PO TABS
5.0000 mg | ORAL_TABLET | ORAL | Status: DC | PRN
  Administered 2016-01-18: 10 mg via ORAL
  Administered 2016-01-19: 5 mg via ORAL
  Administered 2016-01-19 – 2016-01-20 (×4): 10 mg via ORAL
  Filled 2016-01-18 (×5): qty 2
  Filled 2016-01-18: qty 1
  Filled 2016-01-18: qty 2

## 2016-01-18 MED ORDER — DEXAMETHASONE SODIUM PHOSPHATE 10 MG/ML IJ SOLN
INTRAMUSCULAR | Status: DC | PRN
Start: 2016-01-18 — End: 2016-01-18
  Administered 2016-01-18: 10 mg via INTRAVENOUS

## 2016-01-18 MED ORDER — HYDROMORPHONE HCL 1 MG/ML IJ SOLN
0.5000 mg | INTRAMUSCULAR | Status: DC | PRN
  Administered 2016-01-18: 0.5 mg via INTRAVENOUS
  Filled 2016-01-18: qty 1

## 2016-01-18 MED ORDER — PROPOFOL 10 MG/ML IV BOLUS
INTRAVENOUS | Status: DC | PRN
  Administered 2016-01-18: 150 mg via INTRAVENOUS

## 2016-01-18 MED ORDER — ONDANSETRON HCL 4 MG/2ML IJ SOLN
INTRAMUSCULAR | Status: AC
  Filled 2016-01-18: qty 2

## 2016-01-18 MED ORDER — CEFAZOLIN SODIUM-DEXTROSE 2-3 GM-% IV SOLR
INTRAVENOUS | Status: AC
  Filled 2016-01-18: qty 50

## 2016-01-18 MED ORDER — PRIMIDONE 50 MG PO TABS
50.0000 mg | ORAL_TABLET | Freq: Two times a day (BID) | ORAL | Status: DC
  Administered 2016-01-18 – 2016-01-20 (×4): 50 mg via ORAL
  Filled 2016-01-18 (×5): qty 1

## 2016-01-18 MED ORDER — SUGAMMADEX SODIUM 200 MG/2ML IV SOLN
INTRAVENOUS | Status: AC
  Filled 2016-01-18: qty 2

## 2016-01-18 MED ORDER — SUGAMMADEX SODIUM 200 MG/2ML IV SOLN
INTRAVENOUS | Status: DC | PRN
  Administered 2016-01-18: 200 mg via INTRAVENOUS

## 2016-01-18 MED ORDER — LABETALOL HCL 5 MG/ML IV SOLN
5.0000 mg | INTRAVENOUS | Status: DC | PRN
  Administered 2016-01-18: 5 mg via INTRAVENOUS

## 2016-01-18 MED ORDER — ALBUTEROL SULFATE (2.5 MG/3ML) 0.083% IN NEBU: 3.0000 mL | INHALATION_SOLUTION | RESPIRATORY_TRACT | Status: DC | PRN

## 2016-01-18 MED ORDER — PHENYLEPHRINE HCL 10 MG/ML IJ SOLN
INTRAMUSCULAR | Status: DC | PRN
  Administered 2016-01-18 (×2): 80 ug via INTRAVENOUS

## 2016-01-18 MED ORDER — SODIUM CHLORIDE 0.9 % IR SOLN
Status: DC | PRN
  Administered 2016-01-18: 1000 mL

## 2016-01-18 MED ORDER — HYDROMORPHONE HCL 1 MG/ML IJ SOLN
INTRAMUSCULAR | Status: AC
  Administered 2016-01-18: 0.5 mg via INTRAVENOUS
  Filled 2016-01-18: qty 1

## 2016-01-18 MED ORDER — METHOCARBAMOL 500 MG PO TABS: 500.0000 mg | ORAL_TABLET | Freq: Four times a day (QID) | ORAL | Status: DC | PRN

## 2016-01-18 MED ORDER — LACTATED RINGERS IV SOLN
INTRAVENOUS | Status: DC
  Administered 2016-01-18: 16:00:00 via INTRAVENOUS

## 2016-01-18 MED ORDER — ACETAMINOPHEN 325 MG PO TABS
650.0000 mg | ORAL_TABLET | ORAL | Status: DC | PRN
  Administered 2016-01-19 – 2016-01-20 (×3): 650 mg via ORAL
  Filled 2016-01-18 (×3): qty 2

## 2016-01-18 MED ORDER — LIDOCAINE HCL (CARDIAC) 20 MG/ML IV SOLN
INTRAVENOUS | Status: DC | PRN
  Administered 2016-01-18: 100 mg via INTRAVENOUS

## 2016-01-18 MED ORDER — SUCCINYLCHOLINE CHLORIDE 20 MG/ML IJ SOLN
INTRAMUSCULAR | Status: DC | PRN
  Administered 2016-01-18: 100 mg via INTRAVENOUS

## 2016-01-18 MED ORDER — EPHEDRINE SULFATE 50 MG/ML IJ SOLN
INTRAMUSCULAR | Status: AC
  Filled 2016-01-18: qty 1

## 2016-01-18 MED ORDER — PHENOL 1.4 % MT LIQD: 1.0000 | OROMUCOSAL | Status: DC | PRN

## 2016-01-18 MED ORDER — BISACODYL 5 MG PO TBEC: 5.0000 mg | DELAYED_RELEASE_TABLET | Freq: Every day | ORAL | Status: DC | PRN

## 2016-01-18 MED ORDER — FENTANYL CITRATE (PF) 100 MCG/2ML IJ SOLN
INTRAMUSCULAR | Status: AC
  Filled 2016-01-18: qty 2

## 2016-01-18 MED ORDER — DEXAMETHASONE SODIUM PHOSPHATE 10 MG/ML IJ SOLN
INTRAMUSCULAR | Status: AC
  Filled 2016-01-18: qty 1

## 2016-01-18 MED ORDER — ACETAMINOPHEN 650 MG RE SUPP: 650.0000 mg | RECTAL | Status: DC | PRN

## 2016-01-18 MED ORDER — ONDANSETRON HCL 4 MG/2ML IJ SOLN
INTRAMUSCULAR | Status: DC | PRN
  Administered 2016-01-18: 4 mg via INTRAVENOUS

## 2016-01-18 MED ORDER — THROMBIN 5000 UNITS EX SOLR
CUTANEOUS | Status: AC
  Filled 2016-01-18: qty 10000

## 2016-01-18 MED ORDER — BUPIVACAINE LIPOSOME 1.3 % IJ SUSP
20.0000 mL | Freq: Once | INTRAMUSCULAR | Status: AC
  Administered 2016-01-18: 7 mL
  Filled 2016-01-18: qty 20

## 2016-01-18 MED ORDER — FENTANYL CITRATE (PF) 100 MCG/2ML IJ SOLN
INTRAMUSCULAR | Status: DC | PRN
  Administered 2016-01-18: 25 ug via INTRAVENOUS
  Administered 2016-01-18: 50 ug via INTRAVENOUS
  Administered 2016-01-18 (×2): 25 ug via INTRAVENOUS
  Administered 2016-01-18: 100 ug via INTRAVENOUS
  Administered 2016-01-18: 25 ug via INTRAVENOUS
  Administered 2016-01-18 (×2): 50 ug via INTRAVENOUS

## 2016-01-18 MED ORDER — ESMOLOL HCL 100 MG/10ML IV SOLN
INTRAVENOUS | Status: DC | PRN
  Administered 2016-01-18 (×2): 20 mg via INTRAVENOUS

## 2016-01-18 MED ORDER — PHENYLEPHRINE HCL 10 MG/ML IJ SOLN
10.0000 mg | INTRAVENOUS | Status: DC | PRN
  Administered 2016-01-18: 50 ug/min via INTRAVENOUS

## 2016-01-18 MED ORDER — LISINOPRIL 10 MG PO TABS
10.0000 mg | ORAL_TABLET | Freq: Every day | ORAL | Status: DC
  Administered 2016-01-18: 10 mg via ORAL
  Filled 2016-01-18 (×3): qty 1

## 2016-01-18 SURGICAL SUPPLY — 54 items
BAG ZIPLOCK 12X15 (MISCELLANEOUS) ×10 IMPLANT
BLADE SURG 15 STRL LF DISP TIS (BLADE) ×1 IMPLANT
BLADE SURG 15 STRL SS (BLADE) ×1
CLEANER TIP ELECTROSURG 2X2 (MISCELLANEOUS) ×2 IMPLANT
DRAPE LG THREE QUARTER DISP (DRAPES) ×2 IMPLANT
DRAPE MICROSCOPE LEICA (MISCELLANEOUS) ×2 IMPLANT
DRAPE POUCH INSTRU U-SHP 10X18 (DRAPES) ×2 IMPLANT
DRAPE SHEET LG 3/4 BI-LAMINATE (DRAPES) ×2 IMPLANT
DRAPE SURG 17X11 SM STRL (DRAPES) ×2 IMPLANT
DRSG ADAPTIC 3X8 NADH LF (GAUZE/BANDAGES/DRESSINGS) ×2 IMPLANT
DURAPREP 26ML APPLICATOR (WOUND CARE) ×2 IMPLANT
ELECT BLADE TIP CTD 4 INCH (ELECTRODE) ×2 IMPLANT
ELECT REM PT RETURN 9FT ADLT (ELECTROSURGICAL) ×2
ELECTRODE REM PT RTRN 9FT ADLT (ELECTROSURGICAL) ×1 IMPLANT
GAUZE SPONGE 4X4 12PLY STRL (GAUZE/BANDAGES/DRESSINGS) ×2 IMPLANT
GLOVE BIO SURGEON STRL SZ 6.5 (GLOVE) ×2 IMPLANT
GLOVE BIOGEL PI IND STRL 6.5 (GLOVE) ×2 IMPLANT
GLOVE BIOGEL PI IND STRL 7.5 (GLOVE) ×1 IMPLANT
GLOVE BIOGEL PI IND STRL 8 (GLOVE) ×1 IMPLANT
GLOVE BIOGEL PI INDICATOR 6.5 (GLOVE) ×2
GLOVE BIOGEL PI INDICATOR 7.5 (GLOVE) ×1
GLOVE BIOGEL PI INDICATOR 8 (GLOVE) ×1
GLOVE ECLIPSE 8.0 STRL XLNG CF (GLOVE) ×2 IMPLANT
GLOVE SURG SS PI 6.5 STRL IVOR (GLOVE) ×2 IMPLANT
GLOVE SURG SS PI 7.5 STRL IVOR (GLOVE) ×2 IMPLANT
GOWN STRL REUS W/ TWL LRG LVL3 (GOWN DISPOSABLE) ×1 IMPLANT
GOWN STRL REUS W/TWL LRG LVL3 (GOWN DISPOSABLE) ×6 IMPLANT
GOWN STRL REUS W/TWL XL LVL3 (GOWN DISPOSABLE) ×2 IMPLANT
KIT BASIN OR (CUSTOM PROCEDURE TRAY) ×2 IMPLANT
KIT POSITIONING SURG ANDREWS (MISCELLANEOUS) IMPLANT
MANIFOLD NEPTUNE II (INSTRUMENTS) ×2 IMPLANT
MARKER SKIN DUAL TIP RULER LAB (MISCELLANEOUS) ×2 IMPLANT
NEEDLE HYPO 22GX1.5 SAFETY (NEEDLE) ×2 IMPLANT
NEEDLE SPNL 18GX3.5 QUINCKE PK (NEEDLE) ×4 IMPLANT
PACK LAMINECTOMY ORTHO (CUSTOM PROCEDURE TRAY) ×2 IMPLANT
PAD ABD 8X10 STRL (GAUZE/BANDAGES/DRESSINGS) ×8 IMPLANT
PATTIES SURGICAL .5 X.5 (GAUZE/BANDAGES/DRESSINGS) ×2 IMPLANT
PATTIES SURGICAL .75X.75 (GAUZE/BANDAGES/DRESSINGS) ×2 IMPLANT
PATTIES SURGICAL 1X1 (DISPOSABLE) IMPLANT
RUBBERBAND STERILE (MISCELLANEOUS) ×2 IMPLANT
SPONGE LAP 4X18 X RAY DECT (DISPOSABLE) ×12 IMPLANT
SPONGE SURGIFOAM ABS GEL 100 (HEMOSTASIS) ×2 IMPLANT
STAPLER VISISTAT 35W (STAPLE) ×2 IMPLANT
SUT VIC AB 0 CT1 27 (SUTURE) ×1
SUT VIC AB 0 CT1 27XBRD ANTBC (SUTURE) ×1 IMPLANT
SUT VIC AB 1 CT1 27 (SUTURE) ×3
SUT VIC AB 1 CT1 27XBRD ANTBC (SUTURE) ×3 IMPLANT
SUT VIC AB 2-0 CT1 27 (SUTURE) ×2
SUT VIC AB 2-0 CT1 TAPERPNT 27 (SUTURE) ×2 IMPLANT
SYR 20CC LL (SYRINGE) ×4 IMPLANT
TOWEL OR 17X26 10 PK STRL BLUE (TOWEL DISPOSABLE) ×2 IMPLANT
TRAY FOLEY W/METER SILVER 16FR (SET/KITS/TRAYS/PACK) ×2 IMPLANT
YANKAUER SUCT BULB TIP 10FT TU (MISCELLANEOUS) ×2 IMPLANT
YANKAUER SUCT BULB TIP NO VENT (SUCTIONS) ×2 IMPLANT

## 2016-01-18 NOTE — Anesthesia Procedure Notes (Signed)
Procedure Name: Intubation Date/Time: 01/18/2016 11:14 AM Performed by: West Pugh Pre-anesthesia Checklist: Patient identified, Emergency Drugs available, Suction available, Patient being monitored and Timeout performed Patient Re-evaluated:Patient Re-evaluated prior to inductionOxygen Delivery Method: Circle system utilized Preoxygenation: Pre-oxygenation with 100% oxygen Intubation Type: IV induction Ventilation: Mask ventilation without difficulty Laryngoscope Size: Glidescope and 3 Grade View: Grade I Tube type: Oral Tube size: 7.5 mm Number of attempts: 1 Airway Equipment and Method: Stylet Placement Confirmation: ETT inserted through vocal cords under direct vision,  positive ETCO2,  CO2 detector and breath sounds checked- equal and bilateral Secured at: 21 cm Tube secured with: Tape Dental Injury: Teeth and Oropharynx as per pre-operative assessment  Comments: DL x1 with MAC 4 without visualizations of cords. Glidescope utilized

## 2016-01-18 NOTE — Progress Notes (Signed)
Patient noted to use CPAP at home, but states he not been compliant as of late. He wishes to not use CPAP tonight, but to continue the ETCO2 monitoring via nasal canula. Order placed for nocturnal CPAP per RT protocol. RT will continue to follow.

## 2016-01-18 NOTE — Anesthesia Postprocedure Evaluation (Signed)
Anesthesia Post Note  Patient: Bryan [name]  Procedure(s) Performed: Procedure(s) (LRB):  DECOMPRESSION L4-L5 MICRODISCECTOMY L4-L5 ON LEFT FOR SPINAL STENOSIS (Left)  Patient location during evaluation: PACU Anesthesia Type: General Level of consciousness: awake and alert Pain management: pain level controlled Vital Signs Assessment: post-procedure vital signs reviewed and stable Respiratory status: spontaneous breathing, nonlabored ventilation, respiratory function stable and patient connected to nasal cannula oxygen Cardiovascular status: blood pressure returned to baseline and stable Postop Assessment: no signs of nausea or vomiting Anesthetic complications: no    Last Vitals:  Filed Vitals:   01/18/16 1836 01/18/16 1947  BP: 188/81 177/75  Pulse: 71 79  Temp: 37 C 37.3 C  Resp: 18 18    Last Pain:  Filed Vitals:   01/18/16 1948  PainSc: 6                  Shadiamond Koska L

## 2016-01-18 NOTE — Progress Notes (Signed)
01/18/16 Nursing 2320 Ardeen Jourdain called reg patients blood pressure in the 180's consistently despite lisinopril given at 7pm. Order received for one time dose of metoprolol. Will continue to monitor patient

## 2016-01-18 NOTE — Progress Notes (Signed)
Dr. Gifford Shave made aware of patient's blood pressures and heart rates- orders given- Labetalol 5 mg IVP given

## 2016-01-18 NOTE — Interval H&P Note (Signed)
History and Physical Interval Note:  01/18/2016 11:00 AM  Bryan [name]  has presented today for surgery, with the diagnosis of L4-L5 LEFT HNP  The various methods of treatment have been discussed with the patient and family. After consideration of risks, benefits and other options for treatment, the patient has consented to  Procedure(s):  DECOMPRESSION L4-L5 MICRODISCECTOMY L4-L5 ON LEFT (Left) as a surgical intervention .  The patient's history has been reviewed, patient examined, no change in status, stable for surgery.  I have reviewed the patient's chart and labs.  Questions were answered to the patient's satisfaction.     Bryan [name] A

## 2016-01-18 NOTE — H&P (Signed)
Bryan [name] is an 70 y.o. male.   Chief Complaint: Left leg pain and weakness of left foot. HPI: Did well after prior Back surgery unti he fell in a hole and re-injured his back. Lumbar Myelogram showed a new HNP on the left.  Past Medical History  Diagnosis Date  . Obesity   . HTN (hypertension)   . BPH (benign prostatic hypertrophy)   . Ulcer   . Hearing deficit     wears hearing aids bilateral  . GERD (gastroesophageal reflux disease)     reports for indigestion he uses mustard   . Essential tremor   . Sleep apnea     cpap - settings at 9 per patient   . Seizures (HCC)     AS A CHILD. only esential tremors now.  . Arthritis     hands, knees, cervical area. Back pain. Rheumatoid arthritis- weekly injections.  . Refusal of blood transfusions as patient is Jehovah's Witness   . Anemia   . Right bundle branch block     history of    Past Surgical History  Procedure Laterality Date  . Tonsillectomy    . Eye surgery      right, growth excision  . Colonoscopy    . Vasectomy    . Anterior cervical decomp/discectomy fusion N/A 03/30/2013    Procedure: ANTERIOR CERVICAL DECOMPRESSION/DISCECTOMY FUSION 2 LEVELS;  Surgeon: Otilio Connors, MD;  Location: [facility] NEURO ORS;  Service: Neurosurgery;  Laterality: N/A;  C4-5 C5-6 Anterior cervical decompression/diskectomy/fusion/Allograft/Plate  . Spine surgery    . Wrist ganglion excision Left   . Cholecystectomy N/A 12/07/2013    Procedure: LAPAROSCOPIC CHOLECYSTECTOMY WITH INTRAOPERATIVE CHOLANGIOGRAM;  Surgeon: Adin Hector, MD;  Location: [facility] ORS;  Service: General;  Laterality: N/A;  . Decompressive lumbar laminectomy level 2 N/A 02/15/2015    Procedure: COMPLETE DECOMPRESSIVE LUMBAR LAMINECTOMY L4-L5/ FORAMINOTOMY TO L4 NERVE ROOT AND L5 NERVE ROOT BILATERALLY;  Surgeon: [name], MD;  Location: [facility] ORS;  Service: Orthopedics;  Laterality: N/A;    Family History  Problem Relation Age of Onset  . Colon cancer Maternal Grandfather    . Diabetes Paternal Aunt     x 4 aunts  . Kidney disease Brother   . Stroke Maternal Grandmother    Social History:  reports that he quit smoking about 43 years ago. His smoking use included Cigarettes. He has never used smokeless tobacco. He reports that he drinks alcohol. He reports that he does not use illicit drugs.  Allergies:  Allergies  Allergen Reactions  . Other Other (See Comments)    BLOOD PRODUCT REFUSAL (patient is a Jehovah's Witness)    Medications Prior to Admission  Medication Sig Dispense Refill  . aspirin EC 81 MG tablet Take 81 mg by mouth daily.    [name] lisinopril (PRINIVIL,ZESTRIL) 10 MG tablet Take 10 mg by mouth daily.    . methocarbamol (ROBAXIN) 500 MG tablet Take 500 mg by mouth 2 (two) times daily.    . methotrexate 250 MG/10ML injection Inject 0.6 mLs into the skin every 7 (seven) days.     . primidone (MYSOLINE) 50 MG tablet Take 50 mg by mouth 2 (two) times daily.     . Tamsulosin HCl (FLOMAX) 0.4 MG CAPS Take 0.4 mg by mouth at bedtime.     . Albuterol Sulfate (PROAIR RESPICLICK) 123XX123 (90 BASE) MCG/ACT AEPB Inhale 2 puffs into the lungs every 4 (four) hours as needed. (Patient not taking: Reported on 10/09/2015) 1  each 0  . HYDROcodone-acetaminophen (NORCO/VICODIN) 5-325 MG tablet Take 1-2 tablets by mouth every 4 (four) hours as needed for moderate pain or severe pain. (Patient not taking: Reported on 01/04/2016) 15 tablet 0    No results found for this or any previous visit (from the past 48 hour(s)). No results found.  Review of Systems  Constitutional: Negative.   HENT: Negative.   Eyes: Negative.   Respiratory: Negative.   Cardiovascular: Negative.   Gastrointestinal: Negative.   Genitourinary: Negative.   Musculoskeletal: Positive for back pain and falls.  Skin: Negative.   Neurological: Positive for focal weakness.  Endo/Heme/Allergies: Negative.   Psychiatric/Behavioral: Negative.     Blood pressure 161/78, pulse 70, temperature 98 F  (36.7 C), temperature source Oral, resp. rate 16, height 5\' 5"  (1.651 m), weight 99.791 kg (220 lb), SpO2 100 %. Physical Exam  Constitutional: He appears well-developed.  HENT:  Head: Normocephalic.  Eyes: Pupils are equal, round, and reactive to light.  Neck: Normal range of motion.  Cardiovascular: Normal rate.   Respiratory: Effort normal.  GI: Soft.  Musculoskeletal:  Weakness of dorsiflexors of left foot.  Neurological: He is alert.  Weakness of left foot dorsiflexors.  Skin: Skin is warm.  Psychiatric: He has a normal mood and affect.     Assessment/Plan Decompression at L-4-L-5 and Microdiscectomy on the left.  Tobi Bastos, MD 01/18/2016, 10:53 AM

## 2016-01-18 NOTE — Brief Op Note (Signed)
01/18/2016  2:17 PM  PATIENT:  [name]  70 y.o. male  PRE-OPERATIVE DIAGNOSIS:  L4-L5 LEFT HNP,Recurrent and Severe Spinal Stenosis-4-L-5 and L-3-L-4 Partial Foot Drop,Pre-Op and Foraminal Stenosis of L-% nerve root on the left.  POST-OPERATIVE DIAGNOSIS:Same as Pre-Op  PROCEDURE:  Decompression Lumbar Laminectomy at L-3-L-4 and L-4-L-5 for SEVERE Spinal Stenosis and Microdiscectomy at L-4-L-5 0n the left for RECURRENT HNP.Foraminotomy at L-4-L-5 on the Left. SURGEON:  Surgeon(s) and Role:    * [name], MD - Primary  PHYSICIAN ASSISTANT:Amber [place] PA   ASSISTANTS: Ardeen Jourdain PA  ANESTHESIA:   general  EBL:  Total I/O In: 1000 [I.V.:1000] Out: 425 [Blood:425]  BLOOD ADMINISTERED:none  DRAINS: none   LOCAL MEDICATIONS USED:  MARCAINE  20cc of 0.50% with Epinephrine at [facility] of Case and 10cc 6f Exparel at the end of the case.   SPECIMEN:  Source of Specimen:  L-4-L-5  DISPOSITION OF SPECIMEN:  PATHOLOGY  COUNTS:  YES  TOURNIQUET:  * No tourniquets in log *  DICTATION: .Other Dictation: Dictation Number [id number]  PLAN OF CARE: Admit to inpatient   PATIENT DISPOSITION:  stable in OR   Delay start of Pharmacological VTE agent (>24hrs) due to surgical blood loss or risk of bleeding: yes

## 2016-01-18 NOTE — Transfer of Care (Signed)
Immediate Anesthesia Transfer of Care Note  Patient: Bryan [name]  Procedure(s) Performed: Procedure(s):  DECOMPRESSION L4-L5 MICRODISCECTOMY L4-L5 ON LEFT FOR SPINAL STENOSIS (Left)  Patient Location: PACU  Anesthesia Type:General  Level of Consciousness:  sedated, patient cooperative and responds to stimulation  Airway & Oxygen Therapy:Patient Spontanous Breathing and Patient connected to face mask oxgen  Post-op Assessment:  Report given to PACU RN and Post -op Vital signs reviewed and stable  Post vital signs:  Reviewed and stable  Last Vitals:  Filed Vitals:   01/18/16 0905 01/18/16 1440  BP: 161/78 205/97  Pulse: 70 73  Temp: 36.7 C 36.8 C  Resp: 16 6    Complications: No apparent anesthesia complications

## 2016-01-18 NOTE — Anesthesia Preprocedure Evaluation (Addendum)
Anesthesia Evaluation  Patient identified by MRN, date of birth, ID band Patient awake    Reviewed: Allergy & Precautions, H&P , NPO status , Patient's Chart, lab work & pertinent test results  Airway Mallampati: III  TM Distance: >3 FB Neck ROM: Full    Dental no notable dental hx. (+) Teeth Intact, Dental Advisory Given   Pulmonary asthma , sleep apnea and Continuous Positive Airway Pressure Ventilation , former smoker,    Pulmonary exam normal breath sounds clear to auscultation       Cardiovascular hypertension, Pt. on medications Normal cardiovascular exam Rhythm:Regular Rate:Normal  RBBB   Neuro/Psych negative neurological ROS  negative psych ROS   GI/Hepatic Neg liver ROS, GERD  Controlled,  Endo/Other    Renal/GU negative Renal ROS  negative genitourinary   Musculoskeletal  (+) Arthritis , Osteoarthritis and Rheumatoid disorders,    Abdominal (+) + obese,   Peds  Hematology negative hematology ROS (+)   Anesthesia Other Findings Jehovah's witness  Reproductive/Obstetrics negative OB ROS                           Anesthesia Physical Anesthesia Plan  ASA: III  Anesthesia Plan: General   Post-op Pain Management:    Induction: Intravenous  Airway Management Planned: Oral ETT  Additional Equipment:   Intra-op Plan:   Post-operative Plan: Extubation in OR  Informed Consent:   Plan Discussed with: Surgeon  Anesthesia Plan Comments:         Anesthesia Quick Evaluation

## 2016-01-19 DIAGNOSIS — G250 Essential tremor: Secondary | ICD-10-CM | POA: Diagnosis not present

## 2016-01-19 DIAGNOSIS — I10 Essential (primary) hypertension: Secondary | ICD-10-CM | POA: Diagnosis not present

## 2016-01-19 DIAGNOSIS — Z7982 Long term (current) use of aspirin: Secondary | ICD-10-CM | POA: Diagnosis not present

## 2016-01-19 DIAGNOSIS — Z9049 Acquired absence of other specified parts of digestive tract: Secondary | ICD-10-CM | POA: Diagnosis not present

## 2016-01-19 DIAGNOSIS — Z79899 Other long term (current) drug therapy: Secondary | ICD-10-CM | POA: Diagnosis not present

## 2016-01-19 DIAGNOSIS — Z419 Encounter for procedure for purposes other than remedying health state, unspecified: Secondary | ICD-10-CM

## 2016-01-19 DIAGNOSIS — H9193 Unspecified hearing loss, bilateral: Secondary | ICD-10-CM | POA: Diagnosis not present

## 2016-01-19 DIAGNOSIS — I4510 Unspecified right bundle-branch block: Secondary | ICD-10-CM | POA: Diagnosis not present

## 2016-01-19 DIAGNOSIS — K219 Gastro-esophageal reflux disease without esophagitis: Secondary | ICD-10-CM | POA: Diagnosis not present

## 2016-01-19 DIAGNOSIS — J45909 Unspecified asthma, uncomplicated: Secondary | ICD-10-CM | POA: Diagnosis not present

## 2016-01-19 DIAGNOSIS — G4730 Sleep apnea, unspecified: Secondary | ICD-10-CM | POA: Diagnosis not present

## 2016-01-19 DIAGNOSIS — M5126 Other intervertebral disc displacement, lumbar region: Secondary | ICD-10-CM | POA: Diagnosis not present

## 2016-01-19 DIAGNOSIS — Z87891 Personal history of nicotine dependence: Secondary | ICD-10-CM | POA: Diagnosis not present

## 2016-01-19 DIAGNOSIS — N400 Enlarged prostate without lower urinary tract symptoms: Secondary | ICD-10-CM | POA: Diagnosis not present

## 2016-01-19 DIAGNOSIS — M069 Rheumatoid arthritis, unspecified: Secondary | ICD-10-CM | POA: Diagnosis not present

## 2016-01-19 DIAGNOSIS — M4806 Spinal stenosis, lumbar region: Secondary | ICD-10-CM | POA: Diagnosis not present

## 2016-01-19 LAB — URINALYSIS, ROUTINE W REFLEX MICROSCOPIC
Bilirubin Urine: NEGATIVE
GLUCOSE, UA: NEGATIVE mg/dL
KETONES UR: NEGATIVE mg/dL
NITRITE: NEGATIVE
PH: 5.5 (ref 5.0–8.0)
Protein, ur: NEGATIVE mg/dL
SPECIFIC GRAVITY, URINE: 1.013 (ref 1.005–1.030)

## 2016-01-19 LAB — URINE MICROSCOPIC-ADD ON
BACTERIA UA: NONE SEEN
Squamous Epithelial / LPF: NONE SEEN

## 2016-01-19 NOTE — Care Management Note (Signed)
Case Management Note  Patient Details  Name: Bryan [name] MRN: [id number] Date of Birth: 1946/01/08  Subjective/Objective:                  DECOMPRESSION L4-L5 MICRODISCECTOMY L4-L5 ON LEFT FOR SPINAL STENOSIS (Left) Action/Plan: Discharge planning Expected Discharge Date:  01/20/16               Expected Discharge Plan:  Home/Self Care  In-House Referral:     Discharge planning Services  CM Consult  Post Acute Care Choice:    Choice offered to:  Patient  DME Arranged:  3-N-1, Walker rolling DME Agency:  [place]:    [place]:     Status of Service:  Completed, signed off  Medicare Important Message Given:    Date Medicare IM Given:    Medicare IM give by:    Date Additional Medicare IM Given:    Additional Medicare Important Message give by:     If discussed at [place] of Stay Meetings, dates discussed:    Additional Comments: Cm called [facility] DME rep, Lecretia to please deliver the 3n1 and rolling walker to room prior to discharge.  No other CM needs were communicated. Dellie Catholic, RN 01/19/2016, 4:46 PM

## 2016-01-19 NOTE — Evaluation (Addendum)
Physical Therapy Evaluation Patient Details Name: Bryan [name] MRN: [id number] DOB: 1946-05-16 Today's Date: 01/19/2016   History of Present Illness  70 y.o. male with h/o cervical fusion 2014, L4-5 lumbar decompression April 2016, HTN, OA, HOH, essential tremor admitted for L4-L5 microdiscectomy.   Clinical Impression  Patient is s/p above surgery resulting in the deficits listed below (see PT Problem List). Min A for stand pivot transfer to recliner with RW. Pt did not ambulate due to 8/10 pain.  Patient will benefit from skilled PT to increase their independence and safety with mobility (while adhering to their precautions) to allow discharge to the venue listed below.     Follow Up Recommendations No PT follow up    Equipment Recommendations  Rolling walker with 5" wheels    Recommendations for Other Services OT consult     Precautions / Restrictions Precautions Precautions: Back Precaution Booklet Issued: Yes (comment) Precaution Comments: reviewed precautions with pt Restrictions Weight Bearing Restrictions: No      Mobility  Bed Mobility Overal bed mobility: Needs Assistance Bed Mobility: Rolling;Sidelying to Sit Rolling: Min assist Sidelying to sit: Mod assist       General bed mobility comments: cues for technique.  Pt able to get legs off bed; needed assistance with trunk  Transfers Overall transfer level: Needs assistance Equipment used: Rolling walker (2 wheeled) Transfers: Sit to/from [employer] Sit to Stand: Min assist;+2 safety/equipment;From elevated surface Stand pivot transfers: Min assist;+2 safety/equipment       General transfer comment: cues for back precautions and hand placement, +2 safety due to low BP 110/42 sitting, pt denied dizziness  Ambulation/Gait                Stairs            Wheelchair Mobility    Modified Rankin (Stroke Patients Only)       Balance Overall balance assessment: Needs  assistance   Sitting balance-Leahy Scale: Good       Standing balance-Leahy Scale: Poor Standing balance comment: relies on BUE support                             Pertinent Vitals/Pain Pain Assessment: 0-10 Pain Score: 8  Pain Location: surgical site and RLE Pain Descriptors / Indicators: Sore Pain Intervention(s): Premedicated before session;Monitored during session;Limited activity within patient's tolerance    Home Living Family/patient expects to be discharged to:: Private residence Living Arrangements: Spouse/significant other Available Help at Discharge: Family Type of Home: House Home Access: Stairs to enter   Technical brewer of Steps: 1 Home Layout: One level Home Equipment: None      Prior Function Level of Independence: Independent         Comments: walked without AD     Hand Dominance        Extremity/Trunk Assessment   Upper Extremity Assessment: Defer to OT evaluation           Lower Extremity Assessment: RLE deficits/detail RLE Deficits / Details: knee ext +3/5, limited by pain, ankle DF +4/5, sensation intact to light touch       Communication   Communication: HOH  Cognition Arousal/Alertness: Awake/alert Behavior During Therapy: WFL for tasks assessed/performed Overall Cognitive Status: Within Functional Limits for tasks assessed                      General Comments      Exercises  Assessment/Plan    PT Assessment Patient needs continued PT services  PT Diagnosis Difficulty walking;Acute pain   PT Problem List Decreased strength;Decreased activity tolerance;Pain;Decreased knowledge of use of DME;Decreased mobility;Decreased knowledge of precautions  PT Treatment Interventions DME instruction;Gait training;Stair training;Functional mobility training;Therapeutic activities;Patient/family education;Therapeutic exercise   PT Goals (Current goals can be found in the Care Plan section) Acute  Rehab PT Goals Patient Stated Goal: get back to fishing PT Goal Formulation: With patient/family Time For Goal Achievement: 01/26/16 Potential to Achieve Goals: Good    Frequency 7X/week   Barriers to discharge        Co-evaluation PT/OT/SLP Co-Evaluation/Treatment: Yes Reason for Co-Treatment: For patient/therapist safety PT goals addressed during session: Mobility/safety with mobility;Proper use of DME;Balance         End of Session Equipment Utilized During Treatment: Gait belt Activity Tolerance: Patient limited by pain Patient left: in chair;with call bell/phone within reach;with chair alarm set Nurse Communication: Mobility status         Time: [id number] PT Time Calculation (min) (ACUTE ONLY): 21 min   Charges:   PT Evaluation $PT Eval Low Complexity: 1 Procedure     PT G Codes:  PT G-Codes **NOT FOR INPATIENT CLASS** Functional Assessment Tool Used clinical judgement clinical judgement at 1116 on 01/19/16 by Lucile Crater, PT Functional Limitation Mobility: Walking and moving around Mobility: Walking and moving around at 1116 on 01/19/16 by Lucile Crater, PT Mobility: Walking and Moving Around Current Status [phone]) At least 20 percent but less than 40 percent impaired, limited or restricted CJ at 1116 on 01/19/16 by Lucile Crater, PT Mobility: Walking and Moving Around Goal Status [phone]) At least 1 percent but less than 20 percent impaired, limited or restricted CI at 1116 on 01/19/16 by Lucile Crater, PT Mobility: Walking and Moving Around Discharge Status [phone])       Philomena Doheny 01/19/2016, 11:17 AM [phone]

## 2016-01-19 NOTE — Progress Notes (Signed)
Subjective: 1 Day Post-Op Procedure(s) (LRB):  DECOMPRESSION L4-L5 MICRODISCECTOMY L4-L5 ON LEFT FOR SPINAL STENOSIS (Left) Patient reports pain as 1 on 0-10 scale. Doing well today. Good motor strength in Left Lower. Urine is very dark. Will do Urine culture. Plan on DC tomorrow,.   Objective: Vital signs in last 24 hours: Temp:  [98 F (36.7 C)-100.9 F (38.3 C)] 99.7 F (37.6 C) (03/09 0643) Pulse Rate:  [69-101] 84 (03/09 0435) Resp:  [6-20] 16 (03/09 0435) BP: (113-205)/(58-111) 113/58 mmHg (03/09 0435) SpO2:  [93 %-100 %] 100 % (03/09 0435) Weight:  [99.791 kg (220 lb)] 99.791 kg (220 lb) (03/08 1646)  Intake/Output from previous day: 03/08 0701 - 03/09 0700 In: 4065 [P.O.:960; I.V.:2950; IV Piggyback:155] Out: 2050 [Urine:1625; Blood:425] Intake/Output this shift:    No results for input(s): HGB in the last 72 hours. No results for input(s): WBC, RBC, HCT, PLT in the last 72 hours. No results for input(s): NA, K, CL, CO2, BUN, CREATININE, GLUCOSE, CALCIUM in the last 72 hours. No results for input(s): LABPT, INR in the last 72 hours.  Neurologically intact  Assessment/Plan: 1 Day Post-Op Procedure(s) (LRB):  DECOMPRESSION L4-L5 MICRODISCECTOMY L4-L5 ON LEFT FOR SPINAL STENOSIS (Left) No flowsheet data found. wit Pt and DC tomorrow. Up with therapy  Rameen Gohlke A 01/19/2016, 7:29 AM

## 2016-01-19 NOTE — Evaluation (Signed)
Occupational Therapy Evaluation Patient Details Name: Bryan [name] MRN: [id number] DOB: 01/01/1946 Today's Date: 01/19/2016    History of Present Illness 70 y.o. male with h/o cervical fusion 2014, L4-5 lumbar decompression April 2016, HTN, OA, HOH, essential tremor admitted for L4-L5 microdiscectomy.    Clinical Impression   Pt was admitted for the above. He will benefit from skilled OT in acute for reinforce back precautions and increase safety and independence with adls/bathroom transfers    Follow Up Recommendations  Supervision/Assistance - 24 hour    Equipment Recommendations  3 in 1 bedside comode    Recommendations for Other Services       Precautions / Restrictions Precautions Precautions: Back Precaution Booklet Issued: Yes (comment) Precaution Comments: reviewed precautions with pt Restrictions Weight Bearing Restrictions: No      Mobility Bed Mobility Overal bed mobility: Needs Assistance Bed Mobility: Rolling;Sidelying to Sit Rolling: Min assist Sidelying to sit: Mod assist       General bed mobility comments: cues for technique.  Pt able to get legs off bed; needed assistance with trunk  Transfers Overall transfer level: Needs assistance Equipment used: 4-wheeled walker Transfers: Sit to/from [employer] Transfers Sit to Stand: Min assist;+2 safety/equipment;From elevated surface Stand pivot transfers: Min assist;+2 safety/equipment       General transfer comment: cues for back precautions and hand placement    Balance                                            ADL Overall ADL's : Needs assistance/impaired         Upper Body Bathing: Supervision/ safety;Sitting   Lower Body Bathing: Moderate assistance;Sit to/from stand;+2 for safety/equipment   Upper Body Dressing : Supervision/safety;Standing   Lower Body Dressing: Maximal assistance;Sit to/from stand;+2 for safety/equipment   Toilet Transfer: Minimal  assistance;+2 for safety/equipment;RW (recliner)   Toileting- Clothing Manipulation and Hygiene: Moderate assistance;Sit to/from stand         General ADL Comments: reviewed ADL education and back precautions.  Pt has a Secondary school teacher at home. Wife will assist with socks.  Pt may need toilet aide--described as I didn't have one to show him. Pt's BP was low this am and he didn't have pain meds. Worked within tolerance. When rolling, he had a back spasm which subsided     Vision     Perception     Praxis      Pertinent Vitals/Pain Pain Assessment: 0-10 Pain Score: 8  Pain Location: surgical site and RLE Pain Descriptors / Indicators: Sore Pain Intervention(s): Premedicated before session;Monitored during session;Limited activity within patient's tolerance     Hand Dominance     Extremity/Trunk Assessment Upper Extremity Assessment Upper Extremity Assessment: Defer to OT evaluation          Communication Communication Communication: HOH   Cognition Arousal/Alertness: Awake/alert Behavior During Therapy: WFL for tasks assessed/performed Overall Cognitive Status: Within Functional Limits for tasks assessed                     General Comments       Exercises       Shoulder Instructions      Home Living Family/patient expects to be discharged to:: Private residence Living Arrangements: Spouse/significant other Available Help at Discharge: Family Type of Home: House Home Access: Stairs to enter [employer] of Steps: 1  Home Layout: One level     Bathroom Shower/Tub: Therapist, art: Yes   Home Equipment: None          Prior Functioning/Environment Level of Independence: Independent        Comments: walked without AD    OT Diagnosis: Acute pain   OT Problem List: Pain;Decreased knowledge of precautions;Decreased knowledge of use of DME or AE;Decreased activity tolerance    OT Treatment/Interventions: Self-care/ADL training;DME and/or AE instruction;Patient/family education    OT Goals(Current goals can be found in the care plan section) Acute Rehab OT Goals Patient Stated Goal: get back to fishing OT Goal Formulation: With patient Time For Goal Achievement: 01/26/16 Potential to Achieve Goals: Good ADL Goals Pt Will Perform Lower Body Bathing: with min assist;sit to/from stand;with adaptive equipment Pt Will Perform Lower Body Dressing: with min assist;with adaptive equipment;sit to/from stand (pants) Pt Will Transfer to Toilet: with min guard assist;ambulating;bedside commode Pt Will Perform Tub/Shower Transfer:  (verbalize vs demonstrate tub bench with min A) Additional ADL Goal #1: pt will verbalize 3 back precautions  OT Frequency: Min 2X/week   Barriers to D/C:            Co-evaluation   Reason for Co-Treatment: For patient/therapist safety PT goals addressed during session: Mobility/safety with mobility;Proper use of DME;Balance        End of Session    Activity Tolerance:  (decreased BP) Patient left: in chair;with call bell/phone within reach;with chair alarm set   Time: 1029-1103 OT Time Calculation (min): 34 min Charges:  OT General Charges $OT Visit: 1 Procedure OT Evaluation $OT Eval Low Complexity: 1 Procedure G-Codes:    Isabella Ida 2016/02/15, 11:14 AM  Lesle Chris, OTR/L [phone] Feb 15, 2016

## 2016-01-19 NOTE — Progress Notes (Signed)
[facility]  Received orders for rw and commode.  Patient rec'd both items 4/7/201 and is not eligible for them at this time.  I explained this to the patient and asked if he still would like to purchase the items at private pay price.  He declined at this time.    Linward Headland 01/19/2016, 3:23 PM

## 2016-01-19 NOTE — Progress Notes (Signed)
Patient continues to decline the use of nocturnal CPAP tonight. RN aware. Patient again denies home compliance with CPAP. RT will continue to follow.

## 2016-01-19 NOTE — Op Note (Signed)
NAMEVARUN, FOWLKS NO.:  [id number]  MEDICAL RECORD NO.:  [id number]  LOCATION:  [id number]                         FACILITY:  [facility]  PHYSICIAN:  Kipp Brood. Niani Mourer, M.D.DATE OF BIRTH:  [birth year]  DATE OF PROCEDURE:  01/18/2016 DATE OF DISCHARGE:                              OPERATIVE REPORT   SURGEON:  Kipp Brood. Gladstone Lighter, M.D.  OPERATIVE ASSISTANT:  Ardeen Jourdain, PA.  PREOPERATIVE DIAGNOSES: 1. Recurrent herniated disk, L4-5 on the left. 2. Severe spinal stenosis at L3 and L4, and L4-L5.  POSTOPERATIVE DIAGNOSES: 1. Recurrent herniated disk, L4-5 on the left. 2. Severe spinal stenosis at L3 and L4, and L4-L5.  OPERATION: 1. Microdiskectomy for recurrent herniated disk at L4-5 on the left. 2. Foraminotomy for L5 root on the left. 3. Decompression of L3-4 for spinal stenosis. 4. Decompression of L4-5 for spinal stenosis. Note, this is a recurrent problem.  He did well for several months until he fell on a hole, ended up with weakness of his left foot and severe pain down his left leg.  DESCRIPTION OF PROCEDURE:  Under general anesthesia, routine orthopedic prep and draping of the back was carried out.  Appropriate time-out was carried out.  I also marked the appropriate left side of the back in the holding area.  Note, he had 2 g of IV Ancef.  Two needles were placed in his back for localization purposes.  Then, we injected 20 mL of 0.5% Marcaine with epinephrine into the back to control bleeding.  At this time, an incision was made over the previous incision site.  Bleeders identified and cauterized.  Note, there was extremely marked thickness of soft tissue.  This was a very time consuming procedure.  We finally got down, identified the spinous process, first of L3, so we had a bony landmark.  After that the self-retaining retractors were inserted.  I carefully dissected the scar tissue away from the lateral recesses.  We removed the central part of  the scar tissues well to decompress the canal.  Note, at this particular time, I started out far lateral on the left and I utilized curettes all the way to work my way out into the canal.  We went up at three-fourth, was extremely tight.  We decompressed that centrally into the left.  We also removed the spinous process of L3 at this time.  At this point, I then went down the lateral recess and noted the L5 root area was extremely crushed.  There was marked overgrowth of bone.  He had a pseudospondylolisthesis and I believe that was helping precipitate the pressure on the root.  We first went out into the foramen, did a nice foraminotomy.  We then went out laterally with great care to avoid the nerve.  As soon as we started to decompress the lateral recess, disk material came out and we gently removed that.  Note, the root was quite crushed at this particular point.  I went out and did as much decompression of the lateral recess as we could and did partial facetectomy to free up that root.  There was no spinal fluid leak during the procedure.  Our  main concern was the total function in that foot following surgery because of the weakness and the severe nature of that compression.  We then gently went out again with the instrument out into the foramen and an x-ray was taken. Multiple x-rays were taken during the procedure as well.  We thoroughly irrigated out the area after we did decompression.  We once again inspected to make sure there were no dural leaks, there were none.  The wound then was closed in layers in usual fashion.  At the end of procedure, I injected about 10 mL of Exparel into the wound site. Sterile dressings were applied after the wound was closed.          ______________________________ Kipp Brood Gladstone Lighter, M.D.     RAG/MEDQ  D:  01/18/2016  T:  01/18/2016  Job:  [id number]

## 2016-01-19 NOTE — Care Management Obs Status (Signed)
[facility] NOTIFICATION   Patient Details  Name: Bryan [name] MRN: [id number] Date of Birth: 1945/12/15   Medicare Observation Status Notification Given:  Yes  MOON and [id number]; pt has copy; originals to CMA.  Dellie Catholic, RN 01/19/2016, 4:44 PM

## 2016-01-20 DIAGNOSIS — I4510 Unspecified right bundle-branch block: Secondary | ICD-10-CM | POA: Diagnosis not present

## 2016-01-20 DIAGNOSIS — N400 Enlarged prostate without lower urinary tract symptoms: Secondary | ICD-10-CM | POA: Diagnosis not present

## 2016-01-20 DIAGNOSIS — G4730 Sleep apnea, unspecified: Secondary | ICD-10-CM | POA: Diagnosis not present

## 2016-01-20 DIAGNOSIS — J45909 Unspecified asthma, uncomplicated: Secondary | ICD-10-CM | POA: Diagnosis not present

## 2016-01-20 DIAGNOSIS — Z7982 Long term (current) use of aspirin: Secondary | ICD-10-CM | POA: Diagnosis not present

## 2016-01-20 DIAGNOSIS — G250 Essential tremor: Secondary | ICD-10-CM | POA: Diagnosis not present

## 2016-01-20 DIAGNOSIS — Z87891 Personal history of nicotine dependence: Secondary | ICD-10-CM | POA: Diagnosis not present

## 2016-01-20 DIAGNOSIS — I10 Essential (primary) hypertension: Secondary | ICD-10-CM | POA: Diagnosis not present

## 2016-01-20 DIAGNOSIS — M4806 Spinal stenosis, lumbar region: Secondary | ICD-10-CM | POA: Diagnosis not present

## 2016-01-20 DIAGNOSIS — M5126 Other intervertebral disc displacement, lumbar region: Secondary | ICD-10-CM | POA: Diagnosis not present

## 2016-01-20 DIAGNOSIS — M069 Rheumatoid arthritis, unspecified: Secondary | ICD-10-CM | POA: Diagnosis not present

## 2016-01-20 DIAGNOSIS — K219 Gastro-esophageal reflux disease without esophagitis: Secondary | ICD-10-CM | POA: Diagnosis not present

## 2016-01-20 DIAGNOSIS — Z79899 Other long term (current) drug therapy: Secondary | ICD-10-CM | POA: Diagnosis not present

## 2016-01-20 DIAGNOSIS — H9193 Unspecified hearing loss, bilateral: Secondary | ICD-10-CM | POA: Diagnosis not present

## 2016-01-20 DIAGNOSIS — Z9049 Acquired absence of other specified parts of digestive tract: Secondary | ICD-10-CM | POA: Diagnosis not present

## 2016-01-20 MED ORDER — OXYCODONE HCL 5 MG PO TABS: 5.0000 mg | ORAL_TABLET | ORAL | Status: DC | PRN

## 2016-01-20 MED ORDER — METHOCARBAMOL 500 MG PO TABS: 500.0000 mg | ORAL_TABLET | Freq: Three times a day (TID) | ORAL | Status: DC | PRN

## 2016-01-20 NOTE — Progress Notes (Signed)
Physical Therapy Treatment Patient Details Name: Bryan [name] MRN: [id number] DOB: 1946/01/25 Today's Date: 01/20/2016    History of Present Illness 70 y.o. male with h/o cervical fusion 2014, L4-5 lumbar decompression April 2016, HTN, OA, HOH, essential tremor admitted for L4-L5 microdiscectomy.     PT Comments    Pt OOB in recliner approx 45 min.  Assisted with amb in hallway using walker.  Slow but steady gait with 4/10 back pain.  Assisted to bathroom then back to bed.  Instructed on "Log Roll" tech and proper positioning sidelying.  Reviewed and instructed on back precautions using "Teach Back" and demonstration.  All mobility questions addressed.  Pt instructed on use of ICE. Pt ready for D/C to home.   Follow Up Recommendations  No PT follow up     Equipment Recommendations   (pt plans to get his RW back from MOM who was borrowing it but no longer needs it)    Recommendations for Other Services       Precautions / Restrictions Precautions Precautions: Back Precaution Comments: instructed on back precautions using "Teach Back" and demonstration Restrictions Weight Bearing Restrictions: No    Mobility  Bed Mobility Overal bed mobility: Needs Assistance Bed Mobility: Sit to Sidelying           General bed mobility comments: 50% VC's on proper "Log Roll" tech and assist with B LE's up onto bed.  Positioned pt R sidelying.   Transfers Overall transfer level: Needs assistance Equipment used: Rolling walker (2 wheeled) Transfers: Sit to/from Stand Sit to Stand: Supervision;Min guard         General transfer comment: increased time and 50% VC's on proper hand placement  Ambulation/Gait Ambulation/Gait assistance: Supervision;Min guard Ambulation Distance (Feet): 55 Feet Assistive device: Rolling walker (2 wheeled) Gait Pattern/deviations: Step-through pattern;Decreased stride length;Shuffle Gait velocity: decreased   General Gait Details: slow but staedy  gait.  25% Vc's on proper walker to self distance and safety with turns.     Stairs Stairs:  (no stairs to enter home)          Wheelchair Mobility    Modified Rankin (Stroke Patients Only)       Balance                                    Cognition Arousal/Alertness: Awake/alert Behavior During Therapy: WFL for tasks assessed/performed Overall Cognitive Status: Within Functional Limits for tasks assessed                      Exercises      General Comments        Pertinent Vitals/Pain Pain Assessment: 0-10 Pain Score: 6  Pain Location: back Pain Descriptors / Indicators: Aching Pain Intervention(s): Monitored during session;Repositioned;Ice applied    Home Living                      Prior Function            PT Goals (current goals can now be found in the care plan section) Progress towards PT goals: Progressing toward goals    Frequency  7X/week    PT Plan Current plan remains appropriate    Co-evaluation             End of Session Equipment Utilized During Treatment: Gait belt Activity Tolerance: Patient tolerated treatment well Patient left: in bed;with  call bell/phone within reach     Time: 0915-0945 PT Time Calculation (min) (ACUTE ONLY): 30 min  Charges:  $Gait Training: 8-22 mins $Therapeutic Activity: 8-22 mins                    G Codes:      Rica Koyanagi  PTA [facility]  Acute  Rehab Pager      [phone]

## 2016-01-20 NOTE — Progress Notes (Signed)
Occupational Therapy Treatment Patient Details Name: Bryan [name] MRN: [id number] DOB: [birth year] Today's Date: 01/20/2016    History of present illness 70 y.o. male with h/o cervical fusion 2014, L4-5 lumbar decompression April 2016, HTN, OA, HOH, essential tremor admitted for L4-L5 microdiscectomy.    OT comments  Bed mobility still mod A; pt needs cues for back precautions. Will plan to stop back when wife is here.  Pt does not want AE nor tub DME  Follow Up Recommendations  Supervision/Assistance - 24 hour    Equipment Recommendations   (pt states he will borrow a 3:1-- he used to have one)    Recommendations for Other Services      Precautions / Restrictions Precautions Precautions: Back Precaution Comments: pt recalled 1/3 back precautions Restrictions Weight Bearing Restrictions: No       Mobility Bed Mobility   Bed Mobility: Rolling;Sidelying to Sit Rolling: Min assist Sidelying to sit: Mod assist       General bed mobility comments: cues for technique.  Pt able to get legs off bed; needed assistance with trunk  Transfers   Equipment used: Rolling walker (2 wheeled)   Sit to Stand: Min assist Stand pivot transfers: Min assist       General transfer comment: light assistance to rise and multimodal cues for back precautions as pt tends to lean forward     Balance                                   ADL                           Toilet Transfer: Minimal assistance;Stand-pivot;RW (to recliner)             General ADL Comments: reviewed precautions and showed AE. Pt plans to have his wife assist him with adls.  He can borrow 3:1 commode. Pt is not interested in tub DME and will sponge bathe if needed initially      Vision                     Perception     Praxis      Cognition   Behavior During Therapy: [facility] for tasks assessed/performed Overall Cognitive Status: Within Functional Limits for tasks  assessed                       Extremity/Trunk Assessment               Exercises     Shoulder Instructions       General Comments      Pertinent Vitals/ Pain       Pain Score: 6  (with movement) Pain Descriptors / Indicators: Aching Pain Intervention(s): Limited activity within patient's tolerance;Monitored during session;Premedicated before session;Repositioned  Home Living                                          Prior Functioning/Environment              Frequency Min 2X/week     Progress Toward Goals  OT Goals(current goals can now be found in the care plan section)  Progress towards OT goals: Progressing toward goals     Plan  Co-evaluation                 End of Session     Activity Tolerance Patient tolerated treatment well   Patient Left in chair;with call bell/phone within reach;with chair alarm set   Nurse Communication          Time: [phone] OT Time Calculation (min): 20 min  Charges: OT General Charges $OT Visit: 1 Procedure OT Treatments $Therapeutic Activity: 8-22 mins  Dominick Zertuche 01/20/2016, 8:24 AM  Lesle Chris, OTR/L [phone] 01/20/2016

## 2016-01-20 NOTE — Progress Notes (Signed)
Amber aware of blood pressure being low today.  Per [employer], hold lisinopril today.  Patient is to take blood pressure before taking blood pressure medication at home. If blood pressure is lower than 130/90, do not take blood pressure medication.

## 2016-01-20 NOTE — Discharge Instructions (Addendum)
No driving, lifting, or bending over. For the first three days, remove your dressing, tape a piece of saran wrap over your incision.  Take your shower, then remove the saran wrap and put a clean dressing on. After three days you can shower without the saran wrap.  Call Dr. Gladstone Lighter if any wound complications or temperature of 101 degrees F or over.  Call the office for an appointment to see Dr. Gladstone Lighter in two weeks: [phone] and ask for Dr. Charlestine Night nurse, Brunilda Payor.   Take blood pressure before blood pressure medications. DO NOT take blood pressure medication if blood pressure is below 130/90.

## 2016-01-20 NOTE — Progress Notes (Signed)
   01/19/16 1100  OT Time Calculation  OT Start Time (ACUTE ONLY) 1029  OT Stop Time (ACUTE ONLY) 1103  OT Time Calculation (min) 34 min  OT G-codes **NOT FOR INPATIENT CLASS**  Functional Assessment Tool Used clinical judgment  Functional Limitation Self care  Self Care Current Status [id number]) CL  Self Care Goal Status [id number]) CJ  OT General Charges  $OT Visit 1 Procedure  OT Evaluation  $OT Eval Low Complexity 1 Procedure  Lesle Chris, OTR/L [phone] 01/20/2016

## 2016-01-20 NOTE — Progress Notes (Addendum)
   01/20/16 1356  OT Visit Information  Last OT Received On 01/20/16  Assistance Needed +1  History of Present Illness 70 y.o. male with h/o cervical fusion 2014, L4-5 lumbar decompression April 2016, HTN, OA, HOH, essential tremor admitted for L4-L5 microdiscectomy.   OT Time Calculation  OT Start Time (ACUTE ONLY) 1255  OT Stop Time (ACUTE ONLY) 1322  OT Time Calculation (min) 27 min  Precautions  Precautions Back  Precaution Comments reinforced back precautions with pt and wife  Pain Assessment  Pain Score 6  Pain Location back  Pain Descriptors / Indicators Aching  Pain Intervention(s) Limited activity within patient's tolerance;Monitored during session;Premedicated before session;Repositioned  Cognition  Arousal/Alertness Awake/alert  Behavior During Therapy WFL for tasks assessed/performed  Overall Cognitive Status Within Functional Limits for tasks assessed  ADL  General ADL Comments Wife will assist pt with adls at home:  she assisted with dressing this session--educated to stand next to pt when he is standing for adls and to cue him to keep back straight when going from sit to stand.  Wife also assisted with rolling and supine to sit after OT worked through this with pt.  Pt cannot borrow a 3:1 and he called daughter who works for a medical supply.  She will get this for him  Bed Mobility  Rolling Min assist  Sidelying to sit Min assist;Mod assist  Sit to supine Mod assist  General bed mobility comments cues for technique/back precautions.  Practiced 2x's, once with OT and once with wife  Restrictions  Weight Bearing Restrictions No  Transfers  Transfers Sit to/from Stand  Sit to Stand Min guard  General transfer comment cues for hand placement and back precautions  OT - End of Session  Activity Tolerance Patient tolerated treatment well  Patient left in chair;with call bell/phone within reach;with chair alarm set  OT Assessment/Plan  Follow Up Recommendations  Supervision/Assistance - 24 hour  OT Equipment (daughter is getting 3:1 commode)  OT Goal Progression  Progress towards OT goals (family education completed; will sign off)  OT G-codes **NOT FOR INPATIENT CLASS**  Functional Assessment Tool Used clinical observation  Self Care Discharge Status [phone]) CM (pt assisted wife very little with LB dressing)  OT General Charges  $OT Visit 1 Procedure  OT Treatments  $Self Care/Home Management  8-22 mins  $Therapeutic Activity 8-22 mins  Lesle Chris, OTR/L [phone] 01/20/2016

## 2016-01-20 NOTE — Progress Notes (Signed)
Subjective: 2 Days Post-Op Procedure(s) (LRB):  DECOMPRESSION L4-L5 MICRODISCECTOMY L4-L5 ON LEFT FOR SPINAL STENOSIS (Left) Patient reports pain as 3 on 0-10 scale. Back Pain only. Must ambulate today with walker. DC today after PT.    Objective: Vital signs in last 24 hours: Temp:  [99.5 F (37.5 C)-101.7 F (38.7 C)] 99.6 F (37.6 C) (03/10 0658) Pulse Rate:  [78-96] 91 (03/10 0658) Resp:  [11-18] 16 (03/10 0658) BP: (110-157)/(40-59) 129/59 mmHg (03/10 0658) SpO2:  [94 %-97 %] 97 % (03/10 0658)  Intake/Output from previous day: 03/09 0701 - 03/10 0700 In: 1083.3 [P.O.:600; I.V.:483.3] Out: 2100 [Urine:2100] Intake/Output this shift:    No results for input(s): HGB in the last 72 hours. No results for input(s): WBC, RBC, HCT, PLT in the last 72 hours. No results for input(s): NA, K, CL, CO2, BUN, CREATININE, GLUCOSE, CALCIUM in the last 72 hours. No results for input(s): LABPT, INR in the last 72 hours.  Neurologically intact  Assessment/Plan: 2 Days Post-Op Procedure(s) (LRB):  DECOMPRESSION L4-L5 MICRODISCECTOMY L4-L5 ON LEFT FOR SPINAL STENOSIS (Left) Up with therapy Discharge home with home health,after PT. DC with Walker.  Bryan [name] A 01/20/2016, 7:18 AM

## 2016-01-20 NOTE — Progress Notes (Signed)
Nurse reviewed discharge instructions with patient and wife. Nurse reviewed importance of taking blood pressure before taking blood pressure medications. Patient explains he has blood pressure cuff at home to take blood pressure. Patient agrees not to take blood pressure medication if blood pressure is below 130/90. Patient and wife has no questions at this time.

## 2016-01-20 NOTE — Care Management Note (Signed)
Case Management Note  Patient Details  Name: Bryan [name] MRN: [id number] Date of Birth: 05-Apr-1946  Subjective/Objective:  Spoke to patient about d/c plans-he states I have a rw,& will borrow a 3n1-he also states anything i have pay for i don't want. Nsg updated.                 Action/Plan:d/c home no  Needs or orders.   Expected Discharge Date:                  Expected Discharge Plan:  Home/Self Care  In-House Referral:     Discharge planning Services  CM Consult  Post Acute Care Choice:    Choice offered to:  Patient  DME Arranged:  3-N-1, Walker rolling DME Agency:  [place]:    [place]:     Status of Service:  Completed, signed off  Medicare Important Message Given:    Date Medicare IM Given:    Medicare IM give by:    Date Additional Medicare IM Given:    Additional Medicare Important Message give by:     If discussed at [place] of Stay Meetings, dates discussed:    Additional Comments:  Dessa Phi, RN 01/20/2016, 12:56 PM

## 2016-01-21 LAB — URINE CULTURE: CULTURE: NO GROWTH

## 2016-01-23 NOTE — Discharge Summary (Signed)
Physician Discharge Summary   Patient ID: Bryan [name] MRN: [id number] DOB/AGE: September 11, 1946 70 y.o.  Admit date: 01/18/2016 Discharge date: 01/20/2016  Primary Diagnosis: Lumbar spinal stenosis and disc herniation L4-L5 left  Admission Diagnoses:  Past Medical History  Diagnosis Date  . Obesity   . HTN (hypertension)   . BPH (benign prostatic hypertrophy)   . Ulcer   . Hearing deficit     wears hearing aids bilateral  . GERD (gastroesophageal reflux disease)     reports for indigestion he uses mustard   . Essential tremor   . Sleep apnea     cpap - settings at 9 per patient   . Seizures (HCC)     AS A CHILD. only esential tremors now.  . Arthritis     hands, knees, cervical area. Back pain. Rheumatoid arthritis- weekly injections.  . Refusal of blood transfusions as patient is Jehovah's Witness   . Anemia   . Right bundle branch block     history of   Discharge Diagnoses:   Active Problems:   Spinal stenosis, lumbar region, with neurogenic claudication   Surgery, other elective  Estimated body mass index is 36.61 kg/(m^2) as calculated from the following:   Height as of this encounter: '5\' 5"'$  (1.651 m).   Weight as of this encounter: 99.791 kg (220 lb).  Procedure:  Procedure(s) (LRB):  DECOMPRESSION L4-L5 MICRODISCECTOMY L4-L5 ON LEFT FOR SPINAL STENOSIS (Left)   Consults: None  HPI: Patient presents with the chief complaint of low back pain with radicular pain and weakness into the left leg. He has a history of a successful previous lumbar microdiscectomy. Unfortunately, he fell into a hole about two months ago resulting in back pain. MRI showed a disc herniation at L4-L5 on the left.   Laboratory Data: Admission on 01/18/2016, Discharged on 01/20/2016  Component Date Value Ref Range Status  . Color, Urine 01/19/2016 YELLOW  YELLOW Final  . APPearance 01/19/2016 CLEAR  CLEAR Final  . Specific Gravity, Urine 01/19/2016 1.013  1.005 - 1.030 Final  . pH 01/19/2016  5.5  5.0 - 8.0 Final  . Glucose, UA 01/19/2016 NEGATIVE  NEGATIVE mg/dL Final  . Hgb urine dipstick 01/19/2016 LARGE* NEGATIVE Final  . Bilirubin Urine 01/19/2016 NEGATIVE  NEGATIVE Final  . Ketones, ur 01/19/2016 NEGATIVE  NEGATIVE mg/dL Final  . Protein, ur 01/19/2016 NEGATIVE  NEGATIVE mg/dL Final  . Nitrite 01/19/2016 NEGATIVE  NEGATIVE Final  . Leukocytes, UA 01/19/2016 TRACE* NEGATIVE Final  . Specimen Description 01/19/2016 URINE, CATHETERIZED   Final  . Special Requests 01/19/2016 NONE   Final  . Culture 01/19/2016    Final                   Value:NO GROWTH 2 DAYS Performed at [facility]   . Report Status 01/19/2016 01/21/2016 FINAL   Final  . Squamous Epithelial / LPF 01/19/2016 NONE SEEN  NONE SEEN Final  . WBC, UA 01/19/2016 0-5  0 - 5 WBC/hpf Final  . RBC / HPF 01/19/2016 6-30  0 - 5 RBC/hpf Final  . Bacteria, UA 01/19/2016 NONE SEEN  NONE SEEN Final  Hospital Outpatient Visit on 01/06/2016  Component Date Value Ref Range Status  . MRSA, PCR 01/06/2016 NEGATIVE  NEGATIVE Final  . Staphylococcus aureus 01/06/2016 NEGATIVE  NEGATIVE Final   Comment:        The Xpert SA Assay (FDA approved for NASAL specimens in patients over 30 years of age), is one  component of a comprehensive surveillance program.  Test performance has been validated by [facility] for patients greater than or equal to 67 year old. It is not intended to diagnose infection nor to guide or monitor treatment.   [name] aPTT 01/06/2016 28  24 - 37 seconds Final  . Prothrombin Time 01/06/2016 14.6  11.6 - 15.2 seconds Final  . INR 01/06/2016 1.16  0.00 - 1.49 Final  . Transfuse no blood products 01/06/2016 TRANSFUSE NO BLOOD PRODUCTS, VERIFIED BY Hector Brunswick rn at [facility] on 02.24.17   Final  Office Visit on 12/19/2015  Component Date Value Ref Range Status  . Sodium 12/19/2015 141  135 - 146 mmol/L Final  . Potassium 12/19/2015 4.0  3.5 - 5.3 mmol/L Final  . Chloride 12/19/2015 102  98 - 110  mmol/L Final  . CO2 12/19/2015 30  20 - 31 mmol/L Final  . Glucose, Bld 12/19/2015 89  65 - 99 mg/dL Final  . BUN 12/19/2015 17  7 - 25 mg/dL Final  . Creat 12/19/2015 1.10  0.70 - 1.18 mg/dL Final  . Total Bilirubin 12/19/2015 0.3  0.2 - 1.2 mg/dL Final  . Alkaline Phosphatase 12/19/2015 131* 40 - 115 U/L Final  . AST 12/19/2015 15  10 - 35 U/L Final  . ALT 12/19/2015 12  9 - 46 U/L Final  . Total Protein 12/19/2015 7.1  6.1 - 8.1 g/dL Final  . Albumin 12/19/2015 3.8  3.6 - 5.1 g/dL Final  . Calcium 12/19/2015 9.0  8.6 - 10.3 mg/dL Final  . GFR, Est African American 12/19/2015 78  >=60 mL/min Final  . GFR, Est Non African American 12/19/2015 67  >=60 mL/min Final   Comment:   The estimated GFR is a calculation valid for adults (>=66 years old) that uses the CKD-EPI algorithm to adjust for age and sex. It is   not to be used for children, pregnant women, hospitalized patients,    patients on dialysis, or with rapidly changing kidney function. According to the NKDEP, eGFR >89 is normal, 60-89 shows mild impairment, 30-59 shows moderate impairment, 15-29 shows severe impairment and <15 is ESRD.     . WBC 12/19/2015 6.4  4.6 - 10.2 K/uL Final  . Lymph, poc 12/19/2015 1.5  0.6 - 3.4 Final  . POC LYMPH PERCENT 12/19/2015 23.4  10 - 50 %L Final  . MID (cbc) 12/19/2015 0.5  0 - 0.9 Final  . POC MID % 12/19/2015 7.9  0 - 12 %M Final  . POC Granulocyte 12/19/2015 4.4  2 - 6.9 Final  . Granulocyte percent 12/19/2015 68.7  37 - 80 %G Final  . RBC 12/19/2015 3.87* 4.69 - 6.13 M/uL Final  . Hemoglobin 12/19/2015 12.0* 14.1 - 18.1 g/dL Final  . HCT, POC 12/19/2015 37* 43.5 - 53.7 % Final  . MCV 12/19/2015 95.6  80 - 97 fL Final  . MCH, POC 12/19/2015 30.9  27 - 31.2 pg Final  . MCHC 12/19/2015 32.3  31.8 - 35.4 g/dL Final  . RDW, POC 12/19/2015 18.9   Final  . Platelet Count, POC 12/19/2015 311  142 - 424 K/uL Final  . MPV 12/19/2015 5.8  0 - 99.8 fL Final     X-Rays:Dg Lumbar Spine 2-3  Views  01/06/2016  CLINICAL DATA:  Lumbar surgery. EXAM: LUMBAR SPINE - 2-3 VIEW COMPARISON:  11/22/2015. FINDINGS: The lumbar spine is numbered with the lowest segmented appearing lumbar shaped vertebral body on lateral view as L5. Scoliosis lumbar  spine concave left. Severe multilevel degenerative change. 4 mm anterolisthesis L4 on L5. Surgical clips right upper quadrant. Pelvic calcifications consistent phleboliths. No acute abnormality. IMPRESSION: Severe multilevel degenerative changes lumbar spine with 4 mm anterolisthesis L4 on L5. Lumbar scoliosis concave left. Electronically Signed   By: Marcello Moores  Register   On: 01/06/2016 10:49   Dg Spine Portable 1 View  01/18/2016  CLINICAL DATA:  Intraoperative films 5, L4-5 decompression EXAM: PORTABLE SPINE - 1 VIEW COMPARISON:  Multiple prior studies performed earlier today FINDINGS: Single localization probe is noted with tip projecting over the L5 pedicles. IMPRESSION: Intraoperative localization Electronically Signed   By: Skipper Cliche M.D.   On: 01/18/2016 13:59   Dg Spine Portable 1 View  01/18/2016  CLINICAL DATA:  Intraoperative film 4 L4-5 decompression EXAM: PORTABLE SPINE - 1 VIEW COMPARISON:  1213 hours FINDINGS: Two localization probes intersect at the level of the L4 pedicles, projecting over the superior and inferior facets of L4. IMPRESSION: Intraoperative localization Electronically Signed   By: Skipper Cliche M.D.   On: 01/18/2016 13:22   Dg Spine Portable 1 View  01/18/2016  CLINICAL DATA:  Intraoperative localization for decompression at L4-5 EXAM: PORTABLE SPINE - 1 VIEW COMPARISON:  Film from earlier in the same day FINDINGS: Surgical retractors are now noted with a surgical instrument just at the level of the posterior elements at the L4-5 articulation. IMPRESSION: Intraoperative localization at L4-5 Electronically Signed   By: Inez Catalina M.D.   On: 01/18/2016 12:31   Dg Spine Portable 1 View  01/18/2016  CLINICAL DATA:  Surgical  level L4-5 EXAM: PORTABLE SPINE - 1 VIEW COMPARISON:  01/06/2016 FINDINGS: Posterior surgical instrument is directed at the L3-4 disc space. IMPRESSION: Intraoperative localization as above. Electronically Signed   By: Rolm Baptise M.D.   On: 01/18/2016 11:58   Dg Spine Portable 1 View  01/18/2016  CLINICAL DATA:  Surgical level L4-5 EXAM: PORTABLE SPINE - 1 VIEW COMPARISON:  01/06/2016 FINDINGS: Posterior spinal needles are noted and directed at the L4 and L5 vertebral bodies. IMPRESSION: Intraoperative localization as above. Electronically Signed   By: Rolm Baptise M.D.   On: 01/18/2016 11:56    EKG: Orders placed or performed in visit on 12/19/15  . EKG 12-Lead     Hospital Course: Bryan [name] is a 70 y.o. who was admitted to [facility]. They were brought to the operating room on 01/18/2016 and underwent Procedure(s):  DECOMPRESSION L4-L5 MICRODISCECTOMY L4-L5 ON LEFT FOR SPINAL STENOSIS.  Patient tolerated the procedure well and was later transferred to the recovery room and then to the orthopaedic floor for postoperative care.  They were given PO and IV analgesics for pain control following their surgery.  They were given 24 hours of postoperative antibiotics of  Anti-infectives    Start     Dose/Rate Route Frequency Ordered Stop   01/18/16 1900  ceFAZolin (ANCEF) IVPB 1 g/50 mL premix     1 g 100 mL/hr over 30 Minutes Intravenous 3 times per day 01/18/16 1650 01/19/16 1455   01/18/16 1140  polymyxin B 500,000 Units, bacitracin 50,000 Units in sodium chloride irrigation 0.9 % 500 mL irrigation  Status:  Discontinued       As needed 01/18/16 1204 01/18/16 1444   01/18/16 0854  ceFAZolin (ANCEF) IVPB 2 g/50 mL premix     2 g 100 mL/hr over 30 Minutes Intravenous On call to O.R. 01/18/16 0037 01/18/16 1110     and  started on DVT prophylaxis in the form of Aspirin.   PT and OT were ordered. Discharge planning consulted to help with postop disposition and equipment needs.  Patient  had a fair night on the evening of surgery. He had some issues with hypertension night one, but responded to dose of metoprolol.  They started to get up OOB with therapy on day one. Continued to work with therapy into day two.  Dressing was changed on day two and the incision was clean and dry.   Incision was healing well.  Patient was seen in rounds and was ready to go home.   Diet: Cardiac diet Activity:WBAT Follow-up:in 2 weeks Disposition - Home Discharged Condition: stable   Discharge Instructions    Call MD / Call 911    Complete by:  As directed   If you experience chest pain or shortness of breath, CALL 911 and be transported to the hospital emergency room.  If you develope a fever above 101 F, pus (white drainage) or increased drainage or redness at the wound, or calf pain, call your surgeon's office.     Constipation Prevention    Complete by:  As directed   Drink plenty of fluids.  Prune juice may be helpful.  You may use a stool softener, such as Colace (over the counter) 100 mg twice a day.  Use MiraLax (over the counter) for constipation as needed.     Diet - low sodium heart healthy    Complete by:  As directed      Discharge instructions    Complete by:  As directed   No driving, lifting, or bending over. For the first three days, remove your dressing, tape a piece of saran wrap over your incision.  Take your shower, then remove the saran wrap and put a clean dressing on. After three days you can shower without the saran wrap.  Call Dr. Gladstone Lighter if any wound complications or temperature of 101 degrees F or over.  Call the office for an appointment to see Dr. Gladstone Lighter in two weeks: [phone] and ask for Dr. Charlestine Night nurse, Brunilda Payor.     Increase activity slowly as tolerated    Complete by:  As directed             Medication List    STOP taking these medications        HYDROcodone-acetaminophen 5-325 MG tablet  Commonly known as:  NORCO/VICODIN      TAKE  these medications        Albuterol Sulfate 108 (90 Base) MCG/ACT Aepb  Commonly known as:  PROAIR RESPICLICK  Inhale 2 puffs into the lungs every 4 (four) hours as needed.     aspirin EC 81 MG tablet  Take 81 mg by mouth daily.     lisinopril 10 MG tablet  Commonly known as:  PRINIVIL,ZESTRIL  Take 10 mg by mouth daily.     methocarbamol 500 MG tablet  Commonly known as:  ROBAXIN  Take 1 tablet (500 mg total) by mouth every 8 (eight) hours as needed for muscle spasms.     methotrexate 250 MG/10ML injection  Inject 0.6 mLs into the skin every 7 (seven) days.     oxyCODONE 5 MG immediate release tablet  Commonly known as:  Oxy IR/ROXICODONE  Take 1-3 tablets (5-15 mg total) by mouth every 3 (three) hours as needed for moderate pain or severe pain.     primidone 50 MG tablet  Commonly known as:  MYSOLINE  Take 50 mg by mouth 2 (two) times daily.     tamsulosin 0.4 MG Caps capsule  Commonly known as:  FLOMAX  Take 0.4 mg by mouth at bedtime.           Follow-up Information    Follow up with [facility].   Why:  3n1 and rolling walker   Contact information:   [address] [place] [address] [phone]       Follow up with GIOFFRE,RONALD A, MD. Schedule an appointment as soon as possible for a visit in 2 weeks.   Specialty:  Orthopedic Surgery   Contact information:   [address] [place] [address] [phone]       Signed: Ardeen Jourdain, PA-C Orthopaedic Surgery 01/23/2016, 8:51 AM

## 2016-01-24 ENCOUNTER — Telehealth: Payer: Self-pay

## 2016-01-24 NOTE — Telephone Encounter (Signed)
Alana with [facility] is needing a call about cpap titrations   Please call [phone]

## 2016-01-26 DIAGNOSIS — Z4789 Encounter for other orthopedic aftercare: Secondary | ICD-10-CM | POA: Diagnosis not present

## 2016-01-26 NOTE — Telephone Encounter (Signed)
I would love to help him with this, but unfortunately, we do not know anything about this.  Who handled his sleep study prior? Are they able to do this for him or will they require follow up. 1) last sleep study was performed by Clinton D. Young, MD 02/09/2013--see note.  Contact them if a new sleep study must be performed.  If not, the impression was...  Expand All Collapse All   NAME: Bryan [name], Bryan [name] ACCOUNT NO.: [id number]  MEDICAL RECORD NO.: [id number] PATIENT TYPE: OUT  LOCATION: SLEEP CENTER FACILITY: [facility]  PHYSICIAN: Clinton D. Annamaria Boots, MD, FCCP, FACPDATE OF BIRTH: 06[birth year]  DATE OF STUDY: 02/09/2013   NOCTURNAL POLYSOMNOGRAM  REFERRING PHYSICIAN: Gay Filler Copland, MD  INDICATION FOR STUDY: Hypersomnia with sleep apnea.  EPWORTH SLEEPINESS SCORE: 15/24. BMI 37.3, weight 224 pounds, height 65 inches, neck 18 inches.  MEDICATIONS: Charted and reviewed.  SLEEP ARCHITECTURE: Split study protocol. During the diagnostic phase, total sleep time 128.5 minutes with sleep efficiency 86.8%. Stage I was 6.6%, stage II 72.4%, stage III absent, REM 21% of total sleep time. Sleep latency 13 minutes, REM latency 49 minutes. Awake after sleep onset 6.5 minutes. Arousal index 9.3.  BEDTIME MEDICATION: Flomax, Mysoline.  RESPIRATORY DATA: Split study protocol. Apnea/hypopnea index (AHI) 36.4 per hour. A total of 78 events was scored including 9 obstructive apneas and 69 hypopneas. Events were associated with nonsupine sleep position. REM AHI 93.3 per hour. CPAP was titrated to 18 CWP, with a residual AHI of 21.8 per hour. He was then changed to bilevel (BiPAP) and titrated to an inspiratory pressure of 24, with expiratory pressure of 18 for a residual AHI of 17.6 per hour. He wore a medium ResMed Mirage Quattro FX full-face mask with heated humidifier. The technician was unable to  completely titrate.  OXYGEN DATA: Before CPAP, snoring was moderately loud with oxygen desaturation to a nadir of 85% on room air. With CPAP titration, mean oxygen saturation held 96.5% on room air and snoring was almost completely removed.  CARDIAC DATA: Sinus rhythm with frequent PACs and PVCs.  MOVEMENT/PARASOMNIA: No significant movement disturbance. Bathroom x1.  IMPRESSION/RECOMMENDATION: 1. Severe obstructive sleep apnea/hypopnea syndrome, AHI 36.4 per hour  with moderately loud snoring and oxygen desaturation to a nadir of  85% on room air. 2. CPAP was titrated to 18 CWP, with a residual AHI of 31.8 per hour.  He was then titrated with bilevel (BiPAP) to a final inspiratory  pressure of 24, and expiratory pressure of 18. This gave AHI 17.6  per hour reflecting a few residual events. He wore a medium ResMed  Mirage Quattro FX full-face mask with heated  humidifier. Suggest that he be started at home with BiPAP,  inspiratory 24, and expiratory 18 CWP, and failed to control with  CPAP on the study.

## 2016-01-26 NOTE — Telephone Encounter (Signed)
Can we even help her with this. Should she call the place where he went for sleep study?

## 2016-01-26 NOTE — Telephone Encounter (Signed)
Disregard message. I have contacted alana, and sending a copy of his last sleep study 01/2013 for further information for her to get refill.

## 2016-03-31 ENCOUNTER — Ambulatory Visit (INDEPENDENT_AMBULATORY_CARE_PROVIDER_SITE_OTHER): Payer: Medicare Other | Admitting: Family Medicine

## 2016-03-31 VITALS — BP 134/78 | HR 72 | Temp 98.3°F | Resp 18 | Ht 64.0 in | Wt 221.0 lb

## 2016-03-31 DIAGNOSIS — Z79899 Other long term (current) drug therapy: Secondary | ICD-10-CM | POA: Diagnosis not present

## 2016-03-31 DIAGNOSIS — R351 Nocturia: Secondary | ICD-10-CM | POA: Diagnosis not present

## 2016-03-31 DIAGNOSIS — R0600 Dyspnea, unspecified: Secondary | ICD-10-CM

## 2016-03-31 DIAGNOSIS — M609 Myositis, unspecified: Secondary | ICD-10-CM

## 2016-03-31 DIAGNOSIS — M05742 Rheumatoid arthritis with rheumatoid factor of left hand without organ or systems involvement: Secondary | ICD-10-CM | POA: Diagnosis not present

## 2016-03-31 DIAGNOSIS — R0609 Other forms of dyspnea: Secondary | ICD-10-CM

## 2016-03-31 DIAGNOSIS — M5416 Radiculopathy, lumbar region: Secondary | ICD-10-CM | POA: Diagnosis not present

## 2016-03-31 DIAGNOSIS — M791 Myalgia: Secondary | ICD-10-CM | POA: Diagnosis not present

## 2016-03-31 DIAGNOSIS — IMO0001 Reserved for inherently not codable concepts without codable children: Secondary | ICD-10-CM

## 2016-03-31 DIAGNOSIS — M05741 Rheumatoid arthritis with rheumatoid factor of right hand without organ or systems involvement: Secondary | ICD-10-CM | POA: Diagnosis not present

## 2016-03-31 LAB — CBC WITH DIFFERENTIAL/PLATELET
BASOS ABS: 0 {cells}/uL (ref 0–200)
Basophils Relative: 0 %
EOS PCT: 2 %
Eosinophils Absolute: 94 cells/uL (ref 15–500)
HCT: 38.1 % — ABNORMAL LOW (ref 38.5–50.0)
Hemoglobin: 12.3 g/dL — ABNORMAL LOW (ref 13.2–17.1)
LYMPHS PCT: 23 %
Lymphs Abs: 1081 cells/uL (ref 850–3900)
MCH: 30.8 pg (ref 27.0–33.0)
MCHC: 32.3 g/dL (ref 32.0–36.0)
MCV: 95.3 fL (ref 80.0–100.0)
MONO ABS: 376 {cells}/uL (ref 200–950)
MPV: 8.8 fL (ref 7.5–12.5)
Monocytes Relative: 8 %
NEUTROS PCT: 67 %
Neutro Abs: 3149 cells/uL (ref 1500–7800)
PLATELETS: 265 10*3/uL (ref 140–400)
RBC: 4 MIL/uL — AB (ref 4.20–5.80)
RDW: 16.4 % — AB (ref 11.0–15.0)
WBC: 4.7 10*3/uL (ref 3.8–10.8)

## 2016-03-31 LAB — POC MICROSCOPIC URINALYSIS (UMFC)

## 2016-03-31 LAB — POCT URINALYSIS DIP (MANUAL ENTRY)
Bilirubin, UA: NEGATIVE
Glucose, UA: NEGATIVE
Ketones, POC UA: NEGATIVE
Leukocytes, UA: NEGATIVE
Nitrite, UA: NEGATIVE
PH UA: 5
SPEC GRAV UA: 1.025
UROBILINOGEN UA: 0.2

## 2016-03-31 LAB — TSH: TSH: 0.87 m[IU]/L (ref 0.40–4.50)

## 2016-03-31 LAB — COMPREHENSIVE METABOLIC PANEL
ALBUMIN: 4 g/dL (ref 3.6–5.1)
ALK PHOS: 125 U/L — AB (ref 40–115)
ALT: 13 U/L (ref 9–46)
AST: 19 U/L (ref 10–35)
BILIRUBIN TOTAL: 0.4 mg/dL (ref 0.2–1.2)
BUN: 18 mg/dL (ref 7–25)
CALCIUM: 9 mg/dL (ref 8.6–10.3)
CO2: 27 mmol/L (ref 20–31)
CREATININE: 1.12 mg/dL (ref 0.70–1.18)
Chloride: 103 mmol/L (ref 98–110)
Glucose, Bld: 85 mg/dL (ref 65–99)
Potassium: 4.2 mmol/L (ref 3.5–5.3)
Sodium: 140 mmol/L (ref 135–146)
Total Protein: 7.1 g/dL (ref 6.1–8.1)

## 2016-03-31 LAB — SEDIMENTATION RATE: Sed Rate: 42 mm/hr — ABNORMAL HIGH (ref 0–20)

## 2016-03-31 LAB — C-REACTIVE PROTEIN: CRP: 1.3 mg/dL — AB (ref <0.60)

## 2016-03-31 NOTE — Progress Notes (Signed)
By signing my name below, I, Mesha Guinyard, attest that this documentation has been prepared under the direction and in the presence of Delman Cheadle, MD.  Electronically Signed: Verlee Monte, Medical Scribe. 03/31/2016. 9:51 AM.  Subjective:    Patient ID: Bryan [name], male    DOB: Jan 18, 1946, 70 y.o.   MRN: [id number]  HPI Chief Complaint  Patient presents with  . Foot Pain    Lt foot/ankle  . Knee Pain    both knee    HPI Comments: Bryan [name] is a 70 y.o. male who had spinal surgery 10 weeks ago presents to the [facility] complaining of lower extremity and back pain. He has pain in his lower lumbar where his surgery was done. He had right leg numbness that's located on his right buttock to right exterior leg to popliteal fossa to the left midline. He was told that the numbness from the surgey will go away, but hasn't yet. When he walks around he has pain in the right posterior buttock and edema onset a month. He has pain over his left lower extremity ankle at night that causes him to struggle to walk to the bathroom. He gets up 2-3 times at night to use the bathroom. His right leg had pain after his operation. Pt reports his leg pain prevents him from walking and occurs when he sits for over 40 minutes and after he wakes up. He gets calf pain and walks for relief. He reports swollen knees. He had numbness and pain in his right knee and is afraid to be operated on. He denies using OTC medications and doesn't want to take more than he needs to. He get's his annual physical exam at the [place]. He hasn't had a cortisone shot in a long time. His shoulders don't give him a hard time.  His reports the joints in his right hands are swollen with excruciating pain. At times he has severe hyper pain on the ulnar aspect of his left wrist on dorsum. He ocationally has heart burn and will take a spoonful of mustard for relief.  His c-pap machine is broken and stopped using it for a  week after his son's death. He plans on going to the [place] to fix it.  Patient Active Problem List   Diagnosis Date Noted  . Surgery, other elective 01/19/2016  . Spinal stenosis, lumbar region, with neurogenic claudication 02/15/2015  . Acute cholecystitis with chronic cholecystitis s/p lap LOA/chole 12/07/2013 12/07/2013  . Obesity (BMI 30-39.9) 12/07/2013  . Refusal of blood transfusions as patient is Jehovah's Witness 12/07/2013  . Rheumatoid arthritis ([place]) 10/26/2012  . Nonspecific abnormal finding in stool contents 07/28/2012  . Multiple joint pain 06/05/2012  . History of tobacco use-  06/05/2012  . Asthmatic bronchitis 12/15/2011  . BPH (benign prostatic hyperplasia) 12/15/2011  . Essential tremor 12/15/2011  . HTN (hypertension) 12/15/2011   Past Medical History  Diagnosis Date  . Obesity   . HTN (hypertension)   . BPH (benign prostatic hypertrophy)   . Ulcer   . Hearing deficit     wears hearing aids bilateral  . GERD (gastroesophageal reflux disease)     reports for indigestion he uses mustard   . Essential tremor   . Sleep apnea     cpap - settings at 9 per patient   . Seizures (HCC)     AS A CHILD. only esential tremors now.  . Arthritis     hands,  knees, cervical area. Back pain. Rheumatoid arthritis- weekly injections.  . Refusal of blood transfusions as patient is Jehovah's Witness   . Anemia   . Right bundle branch block     history of   Past Surgical History  Procedure Laterality Date  . Tonsillectomy    . Eye surgery      right, growth excision  . Colonoscopy    . Vasectomy    . Anterior cervical decomp/discectomy fusion N/A 03/30/2013    Procedure: ANTERIOR CERVICAL DECOMPRESSION/DISCECTOMY FUSION 2 LEVELS;  Surgeon: Otilio Connors, MD;  Location: [facility];  Service: Neurosurgery;  Laterality: N/A;  C4-5 C5-6 Anterior cervical decompression/diskectomy/fusion/Allograft/Plate  . Spine surgery    . Wrist ganglion excision Left   . Cholecystectomy  N/A 12/07/2013    Procedure: LAPAROSCOPIC CHOLECYSTECTOMY WITH INTRAOPERATIVE CHOLANGIOGRAM;  Surgeon: Adin Hector, MD;  Location: [facility];  Service: General;  Laterality: N/A;  . Decompressive lumbar laminectomy level 2 N/A 02/15/2015    Procedure: COMPLETE DECOMPRESSIVE LUMBAR LAMINECTOMY L4-L5/ FORAMINOTOMY TO L4 NERVE ROOT AND L5 NERVE ROOT BILATERALLY;  Surgeon: [name], MD;  Location: [facility];  Service: Orthopedics;  Laterality: N/A;  . Lumbar laminectomy/decompression microdiscectomy Left 01/18/2016    Procedure:  DECOMPRESSION L4-L5 MICRODISCECTOMY L4-L5 ON LEFT FOR SPINAL STENOSIS;  Surgeon: [name], MD;  Location: [facility];  Service: Orthopedics;  Laterality: Left;   Allergies  Allergen Reactions  . Other Other (See Comments)    BLOOD PRODUCT REFUSAL (patient is a Jehovah's Witness)   Prior to Admission medications   Medication Sig Start Date End Date Taking? Authorizing Provider  aspirin EC 81 MG tablet Take 81 mg by mouth daily.   Yes Historical Provider, MD  lisinopril (PRINIVIL,ZESTRIL) 10 MG tablet Take 10 mg by mouth daily.   Yes Historical Provider, MD  methotrexate 250 MG/10ML injection Inject 0.6 mLs into the skin every 7 (seven) days.  12/09/15  Yes Historical Provider, MD  primidone (MYSOLINE) 50 MG tablet Take 50 mg by mouth 2 (two) times daily.  04/05/13  Yes Darlyne Russian, MD  Tamsulosin HCl (FLOMAX) 0.4 MG CAPS Take 0.4 mg by mouth at bedtime.    Yes Historical Provider, MD  Albuterol Sulfate (PROAIR RESPICLICK) 123XX123 (90 BASE) MCG/ACT AEPB Inhale 2 puffs into the lungs every 4 (four) hours as needed. Patient not taking: Reported on 10/09/2015 09/07/15   Shawnee Knapp, MD  methocarbamol (ROBAXIN) 500 MG tablet Take 1 tablet (500 mg total) by mouth every 8 (eight) hours as needed for muscle spasms. Patient not taking: Reported on 03/31/2016 01/20/16   Ardeen Jourdain, PA-C   Social History   Social History  . Marital Status: Married    Spouse Name: N/A  . Number  of Children: 2  . Years of Education: N/A   Occupational History  . bus driver    Social History Main Topics  . Smoking status: Former Smoker    Types: Cigarettes    Quit date: 12/14/1972  . Smokeless tobacco: Never Used  . Alcohol Use: Yes     Comment: occasionally  . Drug Use: No  . Sexual Activity: Yes    Birth Control/ Protection: None   Other Topics Concern  . Not on file   Social History Narrative   Depression screen [facility] 2/9 03/31/2016 12/19/2015 10/09/2015 09/07/2015 09/05/2015  Decreased Interest 0 0 0 0 0  Down, Depressed, Hopeless 0 0 0 0 0  PHQ - 2 Score 0 0 0 0 0  Review of Systems  Constitutional: Negative for fever.  Respiratory: Positive for shortness of breath (with activity).   Genitourinary: Positive for frequency.  Musculoskeletal: Positive for myalgias, joint swelling and arthralgias.  Skin: Negative for wound.  Neurological: Positive for numbness.  Psychiatric/Behavioral: Negative for sleep disturbance.   Objective:  BP 134/78 mmHg  Pulse 72  Temp(Src) 98.3 F (36.8 C) (Oral)  Resp 18  Ht 5\' 4"  (1.626 m)  Wt 221 lb (100.245 kg)  BMI 37.92 kg/m2  SpO2 97%  Physical Exam  Constitutional: He is oriented to person, place, and time. He appears well-developed and well-nourished. No distress.  HENT:  Head: Normocephalic and atraumatic.  Eyes: Conjunctivae are normal. Pupils are equal, round, and reactive to light. No scleral icterus.  Neck: Normal range of motion. Neck supple. No thyromegaly present.  Cardiovascular: Normal rate, regular rhythm, normal heart sounds and intact distal pulses.   Pulmonary/Chest: Effort normal and breath sounds normal. No respiratory distress.  Musculoskeletal: He exhibits edema.  Puffiness and nonpitting edema around non CPs and PMP. 1+ swelling in legs bilaterally Well healed incision over entire mid lumbar spine  Lymphadenopathy:    He has no cervical adenopathy.  Neurological: He is alert and oriented to  person, place, and time.  Skin: Skin is warm and dry. He is not diaphoretic.  Psychiatric: He has a normal mood and affect. His behavior is normal.   Results for orders placed or performed in visit on 03/31/16  POCT urinalysis dipstick  Result Value Ref Range   Color, UA yellow yellow   Clarity, UA clear clear   Glucose, UA negative negative   Bilirubin, UA negative negative   Ketones, POC UA negative negative   Spec Grav, UA 1.025    Blood, UA trace-lysed (A) negative   pH, UA 5.0    Protein Ur, POC =30 (A) negative   Urobilinogen, UA 0.2    Nitrite, UA Negative Negative   Leukocytes, UA Negative Negative  POCT Microscopic Urinalysis ([facility])  Result Value Ref Range   WBC,UR,HPF,POC None None WBC/hpf   RBC,UR,HPF,POC Few (A) None RBC/hpf   Bacteria Few (A) None, Too numerous to count   Mucus Present (A) Absent   Epithelial Cells, UR Per Microscopy Few (A) None, Too numerous to count cells/hpf   Casts, Ur, LPF, POC Present    EKG Reading: Nl sinus rhythm with right bundle branch block Assessment & Plan:  Not had any prior elevated blood sugars  BNP was nl at 34 9 months prior.    1. Rheumatoid arthritis involving both hands with positive rheumatoid factor (HCC) - the diagnosis is relatively new for this pt - he was seen by rheum late last year and started on methotrexate - however, pt misunderstood and was unaware that he was supposed to cont to f/u with rheum to assess med effectiveness and change treatment if needed - he was under the impression that they would no longer see him (?) - pt reassured that rheum note clearly states that he was supposed to follow-up and that there are certainly other treatments avail since he is currently not perceiving sig benefit. Pt will call rheum to sched a f/u appt ASAP.  2. High risk medication use   3. Dyspnea on exertion -Needs CXR since on methotrexate with new wosening DOE - pt will try to have done at [facility]  4. Myalgia and myositis   5.  Subacute lumbar radiculopathy - pt also very frustrated that his recent lumbar  surgery has not made any sig benefit to his sxs but it also seems that he has not really informed his ortho/neurosurg doctors about this nor his ongoing debilitating sxs so again encouraged pt to let his providers know this that his sxs have continued to be so severe.  6. Nocturia more than twice per night    Pt does get some of his medical care at the [place] and we have not been able to coordinate with them unfortunately. Encouraged pt to bring his AVS with him to [place] visits to help with continuity. According to our records, he needs prevnar-13, tdap, and also the hep C screen and tetanus.  Orders Placed This Encounter  Procedures  . Comprehensive metabolic panel  . Sedimentation Rate  . C-reactive protein  . CBC with Differential/Platelet  . TSH  . POCT urinalysis dipstick  . POCT Microscopic Urinalysis ([facility])  . EKG 12-Lead  Requested to have all labs faxed to [facility] where pt is going to follow-up  Over 40 min spent in face-to-face evaluation of and consultation with patient and coordination of care.  Over 50% of this time was spent counseling this patient.  I personally performed the services described in this documentation, which was scribed in my presence. The recorded information has been reviewed and considered, and addended by me as needed.   Delman Cheadle, M.D.  [facility] [address] [place], [place] [address] [phone] phone [phone] fax  04/13/2016 11:52 AM

## 2016-03-31 NOTE — Patient Instructions (Addendum)
     IF you received an x-ray today, you will receive an invoice from [facility]. Please contact [facility] at [phone] with questions or concerns regarding your invoice.   IF you received labwork today, you will receive an invoice from [employer]. Please contact [facility] at [phone] with questions or concerns regarding your invoice.   Our billing staff will not be able to assist you with questions regarding bills from these companies.  You will be contacted with the lab results as soon as they are available. The fastest way to get your results is to activate your My Chart account. Instructions are located on the last page of this paperwork. If you have not heard from [place] regarding the results in 2 weeks, please contact this office.     You have had the 23-valent pneumovax but may need the 13-valent prevnar.  According to our records you also need the once time hepatitis C screen, the 13-valent prevnar and a tetanus.  You should also have a chest xray done since you are on methotrexate and having worsening shortness of breath with exertion.

## 2016-04-01 ENCOUNTER — Encounter: Payer: Self-pay | Admitting: Family Medicine

## 2016-04-03 DIAGNOSIS — M2550 Pain in unspecified joint: Secondary | ICD-10-CM | POA: Diagnosis not present

## 2016-04-03 DIAGNOSIS — M5416 Radiculopathy, lumbar region: Secondary | ICD-10-CM | POA: Diagnosis not present

## 2016-04-03 DIAGNOSIS — I7300 Raynaud's syndrome without gangrene: Secondary | ICD-10-CM | POA: Diagnosis not present

## 2016-04-03 DIAGNOSIS — M0579 Rheumatoid arthritis with rheumatoid factor of multiple sites without organ or systems involvement: Secondary | ICD-10-CM | POA: Diagnosis not present

## 2016-05-30 DIAGNOSIS — I7300 Raynaud's syndrome without gangrene: Secondary | ICD-10-CM | POA: Diagnosis not present

## 2016-05-30 DIAGNOSIS — M0579 Rheumatoid arthritis with rheumatoid factor of multiple sites without organ or systems involvement: Secondary | ICD-10-CM | POA: Diagnosis not present

## 2016-05-30 DIAGNOSIS — M2550 Pain in unspecified joint: Secondary | ICD-10-CM | POA: Diagnosis not present

## 2016-05-30 DIAGNOSIS — M5416 Radiculopathy, lumbar region: Secondary | ICD-10-CM | POA: Diagnosis not present

## 2016-06-21 ENCOUNTER — Ambulatory Visit (INDEPENDENT_AMBULATORY_CARE_PROVIDER_SITE_OTHER): Payer: Medicare Other | Admitting: Physician Assistant

## 2016-06-21 DIAGNOSIS — M545 Low back pain, unspecified: Secondary | ICD-10-CM | POA: Insufficient documentation

## 2016-06-21 NOTE — Progress Notes (Signed)
Bryan [name]  MRN: [id number] DOB: 01-19-1946  PCP: Delman Cheadle, MD  Subjective:  Pt presents to clinic for low back pain x2 weeks. At that time, he was moving large water jugs and felt pain in the lower left side of his back. Pain does not radiate up back or down leg. Hurts worse with sitting for prolonged periods, better when he lays down. Has not tried anything for his pain.   He walks a few minutes a day for exercise, does not stretch.    Discectomy of L4-L5 01/2016 from Dr. Gladstone Lighter.  Did not follow-up because he wasn't happy with his surgeon. Denies back pain since the surgery.  Since his surgery he has experienced numbness around his right buttock and halfway across the front of his thigh.   Review of Systems  Constitutional: Negative.   Respiratory: Negative.   Cardiovascular: Negative.   Gastrointestinal: Positive for blood in stool (history of hemorrhoids. Had recent colonoscopy with no abnormal findings.). Negative for abdominal pain, anal bleeding, constipation and diarrhea.  Genitourinary: Negative for difficulty urinating, dysuria, flank pain, frequency and hematuria.  Musculoskeletal: Positive for back pain (low back pain. ). Negative for gait problem and neck pain.  Skin: Negative.   Neurological: Positive for numbness (No new numbness. No numbness to groin/saddle region. Right thigh numbness since his surgery 01/2016). Negative for weakness.    Patient Active Problem List   Diagnosis Date Noted  . Lumbago 06/21/2016  . Spinal stenosis, lumbar region, with neurogenic claudication 02/15/2015  . Obesity (BMI 30-39.9) 12/07/2013  . Refusal of blood transfusions as patient is Jehovah's Witness 12/07/2013  . Rheumatoid arthritis ([place]) 10/26/2012  . Nonspecific abnormal finding in stool contents 07/28/2012  . Multiple joint pain 06/05/2012  . History of tobacco use-  06/05/2012  . Asthmatic bronchitis 12/15/2011  . BPH (benign prostatic hyperplasia) 12/15/2011  .  Essential tremor 12/15/2011  . HTN (hypertension) 12/15/2011    Current Outpatient Prescriptions on File Prior to Visit  Medication Sig Dispense Refill  . aspirin EC 81 MG tablet Take 81 mg by mouth daily.    [name] lisinopril (PRINIVIL,ZESTRIL) 10 MG tablet Take 10 mg by mouth daily.    . methotrexate 250 MG/10ML injection Inject 0.6 mLs into the skin every 7 (seven) days.     . primidone (MYSOLINE) 50 MG tablet Take 50 mg by mouth 2 (two) times daily.     . Tamsulosin HCl (FLOMAX) 0.4 MG CAPS Take 0.4 mg by mouth at bedtime.     . Albuterol Sulfate (PROAIR RESPICLICK) 123XX123 (90 BASE) MCG/ACT AEPB Inhale 2 puffs into the lungs every 4 (four) hours as needed. (Patient not taking: Reported on 06/21/2016) 1 each 0   No current facility-administered medications on file prior to visit.     Allergies  Allergen Reactions  . Other Other (See Comments)    BLOOD PRODUCT REFUSAL (patient is a Jehovah's Witness)    Objective:  BP 134/80 (BP Location: Right Arm, Patient Position: Sitting, Cuff Size: Large)   Pulse 84   Temp 98.5 F (36.9 C) (Oral)   Resp 18   Ht 5\' 4"  (1.626 m)   Wt 225 lb (102.1 kg)   SpO2 97%   BMI 38.62 kg/m   Physical Exam  Constitutional: He is oriented to person, place, and time and well-developed, well-nourished, and in no distress. No distress.  HENT:  Head: Normocephalic and atraumatic.  Pulmonary/Chest: Effort normal. No respiratory distress. He has no wheezes.  Musculoskeletal:  Well-healed surgical scars along spine on low back and left superior glute.  Not TTP paraspinal muscles or low back b/l.   Pain elicited to left low back with backward flexion. No pain elicited with forward flexion or L-R abduction from midline.  SLR normal.  Normal ROM with hip flexion, extension, adduction and abduction.  Sensation and pulses in tact b/l legs.  He is tender and uncomortable when asked to perform sports-rehab low back and gluteal stretches.   Neurological: He is alert  and oriented to person, place, and time. Gait normal.  Skin: Skin is warm and dry. He is not diaphoretic.  Psychiatric: Mood, memory, affect and judgment normal.  Vitals reviewed.   Assessment and Plan :  1. Acute left-sided low back pain without sciatica - Pain medicine not prescribed at this visit due to Methotrexate interactions.  - Supportive care. Instructions printed out and demonstration given for at-home stretches. Discussed with patient to get into a daily routine of stretching and exercising (walking). Heat therapy encouraged as needed. Will take OTC Tylenol for pain.  - Instructed to RTC if pain does not improve and will refer to ortho (not Dr. Gladstone Lighter, as the patient was not satisfied with his treatment) for further work-up.    Mercer Pod, PA-C  [facility] 06/21/2016 3:48 PM

## 2016-06-21 NOTE — Patient Instructions (Addendum)
What can I do to feel better?-The best thing you can do is to stay as active as possible - even if you are in pain. People with low back pain recover faster if they stay active. Walk as much as you can. If you stopped working because of your pain, try to get back to your normal routine soon. But do not overdo it.    IF you received an x-ray today, you will receive an invoice from [facility]. Please contact [facility] at [phone] with questions or concerns regarding your invoice.   IF you received labwork today, you will receive an invoice from [employer]. Please contact [facility] at [phone] with questions or concerns regarding your invoice.   Our billing staff will not be able to assist you with questions regarding bills from these companies.  You will be contacted with the lab results as soon as they are available. The fastest way to get your results is to activate your My Chart account. Instructions are located on the last page of this paperwork. If you have not heard from [place] regarding the results in 2 weeks, please contact this office.     HEAT AND COLD  Cold treatment (icing) relieves pain and reduces inflammation. Cold treatment should be applied for 10 to 15 minutes every 2 to 3 hours for inflammation and pain and immediately after any activity that aggravates your symptoms. Use ice packs or massage the area with a piece of ice (ice massage).  Heat treatment may be used prior to performing the stretching and strengthening activities prescribed by your caregiver, physical therapist, or athletic trainer. Use a heat pack or soak the injury in warm water. SEEK MEDICAL CARE IF:  Treatment seems to offer no benefit, or the condition worsens.  Any medications produce adverse side effects. EXERCISES   These exercises may help you when beginning to rehabilitate your injury. Your symptoms may resolve with or without further  involvement from your physician, physical therapist or athletic trainer. While completing these exercises, remember:   Restoring tissue flexibility helps normal motion to return to the joints. This allows healthier, less painful movement and activity.  An effective stretch should be held for at least 30 seconds.  A stretch should never be painful. You should only feel a gentle lengthening or release in the stretched tissue. FLEXION RANGE OF MOTION AND STRETCHING EXERCISES: STRETCH - Flexion, Single Knee to Chest   Lie on a firm bed or floor with both legs extended in front of you.  Keeping one leg in contact with the floor, bring your opposite knee to your chest. Hold your leg in place by either grabbing behind your thigh or at your knee. Pull until you feel a gentle stretch in your low back.  STRETCH - Flexion, Double Knee to Chest  Lie on a firm bed or floor with both legs extended in front of you.  Keeping one leg in contact with the floor, bring your opposite knee to your chest.  Tense your stomach muscles to support your back and then lift your other knee to your chest. Hold your legs in place by either grabbing behind your thighs or at your knees.  Pull both knees toward your chest until you feel a gentle stretch in your low back.   Tense your stomach muscles and slowly return one leg at a time to the floor.  STRETCH - Low Trunk Rotation   Lie on a firm bed or floor. Keeping  your legs in front of you, bend your knees so they are both pointed toward the ceiling and your feet are flat on the floor.  Extend your arms out to the side. This will stabilize your upper body by keeping your shoulders in contact with the floor. Gently and slowly drop both knees together to one side until you feel a gentle stretch in your low back.     RANGE OF MOTION - Extension, Prone Press Ups  Lie on your stomach on the floor, a bed will be too soft. Place your palms about shoulder width apart and at  the height of your head.  Keeping your back as relaxed as possible, slowly straighten your elbows while keeping your hips on the floor. You may adjust the placement of your hands to maximize your comfort. As you gain motion, your hands will come more underneath your shoulders. .  STRENGTHENING EXERCISES -  These exercises may help you when beginning to rehabilitate your injury. These exercises should be done near your "sweet spot." This is the neutral, low-back arch, somewhere between fully rounded and fully arched, that is your least painful position. When performed in this safe range of motion, these exercises can be used for people who have either a flexion or extension based injury. These exercises may resolve your symptoms with or without further involvement from your physician, physical therapist or athletic trainer. While completing these exercises, remember:   Muscles can gain both the endurance and the strength needed for everyday activities through controlled exercises.  Complete these exercises as instructed by your physician, physical therapist or athletic trainer. Progress with the resistance and repetition exercises only as your caregiver advises.  You may experience muscle soreness or fatigue, but the pain or discomfort you are trying to eliminate should never worsen during these exercises. If this pain does worsen, stop and make certain you are following the directions exactly. If the pain is still present after adjustments, discontinue the exercise until you can discuss the trouble with your clinician. STRENGTHENING - Deep Abdominals, Pelvic Tilt   Lie on a firm bed or floor. Keeping your legs in front of you, bend your knees so they are both pointed toward the ceiling and your feet are flat on the floor.  Tense your lower abdominal muscles to press your low back into the floor. This motion will rotate your pelvis so that your tail bone is scooping upwards rather than pointing at  your feet or into the floor.  With a gentle tension and even breathing, hold this position for __________ seconds. Repeat __________ times. Complete this exercise __________ times per day.  STRENGTHENING - Abdominals, Crunches   Lie on a firm bed or floor. Keeping your legs in front of you, bend your knees so they are both pointed toward the ceiling and your feet are flat on the floor. Cross your arms over your chest.  Slightly tip your chin down without bending your neck.  Tense your abdominals and slowly lift your trunk high enough to just clear your shoulder blades. Lifting higher can put excessive stress on the low back and does not further strengthen your abdominal muscles.  Control your return to the starting position. .  STRENGTHENING - Quadruped, Opposite UE/LE Lift  Assume a hands and knees position on a firm surface. Keep your hands under your shoulders and your knees under your hips. You may place padding under your knees for comfort.  Find your neutral spine and gently tense your  abdominal muscles so that you can maintain this position. Your shoulders and hips should form a rectangle that is parallel with the floor and is not twisted. Keeping your trunk steady, lift your right hand no higher than your shoulder and then your left leg no higher than your hip. Make sure you are not holding your breath..  STRENGTHENING - Abdominals and Quadriceps, Straight Leg Raise   Lie on a firm bed or floor with both legs extended in front of you.  Keeping one leg in contact with the floor, bend the other knee so that your foot can rest flat on the floor.  Find your neutral spine, and tense your abdominal muscles to maintain your spinal position throughout the exercise.  Slowly lift your straight leg off the floor about 6 inches for a count of 15, making sure to not hold your breath.  Still keeping your neutral spine, slowly lower your leg all the way to the floor.  POSTURE AND BODY  MECHANICS CONSIDERATIONS  Keeping correct posture when sitting, standing or completing your activities will reduce the stress put on different body tissues, allowing injured tissues a chance to heal and limiting painful experiences. The following are general guidelines for improved posture. Your physician or physical therapist will provide you with any instructions specific to your needs. While reading these guidelines, remember:  The exercises prescribed by your provider will help you have the flexibility and strength to maintain correct postures.  The correct posture provides the optimal environment for your joints to work. All of your joints have less wear and tear when properly supported by a spine with good posture. This means you will experience a healthier, less painful body.  Correct posture must be practiced with all of your activities, especially prolonged sitting and standing. Correct posture is as important when doing repetitive low-stress activities (typing) as it is when doing a single heavy-load activity (lifting). RESTING POSITIONS Consider which positions are most painful for you when choosing a resting position. If you have pain with flexion-based activities (sitting, bending, stooping, squatting), choose a position that allows you to rest in a less flexed posture. You would want to avoid curling into a fetal position on your side. If your pain worsens with extension-based activities (prolonged standing, working overhead), avoid resting in an extended position such as sleeping on your stomach. Most people will find more comfort when they rest with their spine in a more neutral position, neither too rounded nor too arched. Lying on a non-sagging bed on your side with a pillow between your knees, or on your back with a pillow under your knees will often provide some relief. Keep in mind, being in any one position for a prolonged period of time, no matter how correct your posture, can still  lead to stiffness. PROPER SITTING POSTURE In order to minimize stress and discomfort on your spine, you must sit with correct posture Sitting with good posture should be effortless for a healthy body. Returning to good posture is a gradual process. Many people can work toward this most comfortably by using various supports until they have the flexibility and strength to maintain this posture on their own. When sitting with proper posture, your ears will fall over your shoulders and your shoulders will fall over your hips. You should use the back of the chair to support your upper back. Your low back will be in a neutral position, just slightly arched. You may place a small pillow or folded towel at  the base of your low back for support.  When working at a desk, create an environment that supports good, upright posture. Without extra support, muscles fatigue and lead to excessive strain on joints and other tissues. Keep these recommendations in mind: CHAIR:   A chair should be able to slide under your desk when your back makes contact with the back of the chair. This allows you to work closely.  The chair's height should allow your eyes to be level with the upper part of your monitor and your hands to be slightly lower than your elbows. BODY POSITION  Your feet should make contact with the floor. If this is not possible, use a foot rest.  Keep your ears over your shoulders. This will reduce stress on your neck and low back. INCORRECT SITTING POSTURES   If you are feeling tired and unable to assume a healthy sitting posture, do not slouch or slump. This puts excessive strain on your back tissues, causing more damage and pain. Healthier options include:  Using more support, like a lumbar pillow.  Switching tasks to something that requires you to be upright or walking.  Talking a brief walk.  Lying down to rest in a neutral-spine position. PROLONGED STANDING WHILE SLIGHTLY LEANING FORWARD   When completing a task that requires you to lean forward while standing in one place for a long time, place either foot up on a stationary 2-4 inch high object to help maintain the best posture. When both feet are on the ground, the low back tends to lose its slight inward curve. If this curve flattens (or becomes too large), then the back and your other joints will experience too much stress, fatigue more quickly and can cause pain.  CORRECT STANDING POSTURES Proper standing posture should be assumed with all daily activities, even if they only take a few moments, like when brushing your teeth. As in sitting, your ears should fall over your shoulders and your shoulders should fall over your hips. You should keep a slight tension in your abdominal muscles to brace your spine. Your tailbone should point down to the ground, not behind your body, resulting in an over-extended swayback posture.  INCORRECT STANDING POSTURES  Common incorrect standing postures include a forward head, locked knees and/or an excessive swayback. WALKING Walk with an upright posture. Your ears, shoulders and hips should all line-up. PROLONGED ACTIVITY IN A FLEXED POSITION When completing a task that requires you to bend forward at your waist or lean over a low surface, try to find a way to stabilize 3 of 4 of your limbs. You can place a hand or elbow on your thigh or rest a knee on the surface you are reaching across. This will provide you more stability so that your muscles do not fatigue as quickly. By keeping your knees relaxed, or slightly bent, you will also reduce stress across your low back. CORRECT LIFTING TECHNIQUES DO :   Assume a wide stance. This will provide you more stability and the opportunity to get as close as possible to the object which you are lifting.  Tense your abdominals to brace your spine; then bend at the knees and hips. Keeping your back locked in a neutral-spine position, lift using your leg  muscles. Lift with your legs, keeping your back straight.  Test the weight of unknown objects before attempting to lift them.  Try to keep your elbows locked down at your sides in order get the best strength from  your shoulders when carrying an object.  Always ask for help when lifting heavy or awkward objects. INCORRECT LIFTING TECHNIQUES DO NOT:   Lock your knees when lifting, even if it is a small object.  Bend and twist. Pivot at your feet or move your feet when needing to change directions.  Assume that you cannot safely pick up a paperclip without proper posture.   This information is not intended to replace advice given to you by your health care provider. Make sure you discuss any questions you have with your health care provider.   Document Released: 10/29/2005 Document Revised: 03/15/2015 Document Reviewed: 02/10/2009 Elsevier Interactive Patient Education [employer].

## 2016-08-24 DIAGNOSIS — M0579 Rheumatoid arthritis with rheumatoid factor of multiple sites without organ or systems involvement: Secondary | ICD-10-CM | POA: Diagnosis not present

## 2016-08-24 DIAGNOSIS — M5416 Radiculopathy, lumbar region: Secondary | ICD-10-CM | POA: Diagnosis not present

## 2016-08-24 DIAGNOSIS — I7300 Raynaud's syndrome without gangrene: Secondary | ICD-10-CM | POA: Diagnosis not present

## 2016-08-24 DIAGNOSIS — M2550 Pain in unspecified joint: Secondary | ICD-10-CM | POA: Diagnosis not present

## 2016-11-13 ENCOUNTER — Ambulatory Visit (INDEPENDENT_AMBULATORY_CARE_PROVIDER_SITE_OTHER): Payer: Medicare HMO

## 2016-11-13 ENCOUNTER — Ambulatory Visit (INDEPENDENT_AMBULATORY_CARE_PROVIDER_SITE_OTHER): Payer: Medicare HMO | Admitting: Family Medicine

## 2016-11-13 VITALS — BP 137/68 | HR 103 | Temp 98.5°F | Resp 18 | Ht 64.0 in | Wt 227.4 lb

## 2016-11-13 DIAGNOSIS — J181 Lobar pneumonia, unspecified organism: Secondary | ICD-10-CM

## 2016-11-13 DIAGNOSIS — D899 Disorder involving the immune mechanism, unspecified: Secondary | ICD-10-CM

## 2016-11-13 DIAGNOSIS — I499 Cardiac arrhythmia, unspecified: Secondary | ICD-10-CM | POA: Diagnosis not present

## 2016-11-13 DIAGNOSIS — J4521 Mild intermittent asthma with (acute) exacerbation: Secondary | ICD-10-CM

## 2016-11-13 DIAGNOSIS — J189 Pneumonia, unspecified organism: Secondary | ICD-10-CM

## 2016-11-13 DIAGNOSIS — R000 Tachycardia, unspecified: Secondary | ICD-10-CM | POA: Diagnosis not present

## 2016-11-13 DIAGNOSIS — D849 Immunodeficiency, unspecified: Secondary | ICD-10-CM

## 2016-11-13 DIAGNOSIS — R05 Cough: Secondary | ICD-10-CM | POA: Diagnosis not present

## 2016-11-13 DIAGNOSIS — J22 Unspecified acute lower respiratory infection: Secondary | ICD-10-CM

## 2016-11-13 MED ORDER — ALBUTEROL SULFATE (2.5 MG/3ML) 0.083% IN NEBU
2.5000 mg | INHALATION_SOLUTION | Freq: Once | RESPIRATORY_TRACT | Status: AC
  Administered 2016-11-13: 2.5 mg via RESPIRATORY_TRACT

## 2016-11-13 MED ORDER — HYDROCOD POLST-CPM POLST ER 10-8 MG/5ML PO SUER: 5.0000 mL | Freq: Two times a day (BID) | ORAL | 0 refills | Status: DC | PRN

## 2016-11-13 MED ORDER — ALBUTEROL SULFATE 108 (90 BASE) MCG/ACT IN AEPB: 2.0000 | INHALATION_SPRAY | RESPIRATORY_TRACT | 0 refills | Status: DC | PRN

## 2016-11-13 MED ORDER — AZITHROMYCIN 500 MG PO TABS: 500.0000 mg | ORAL_TABLET | Freq: Every day | ORAL | 0 refills | Status: DC

## 2016-11-13 NOTE — Patient Instructions (Addendum)
If you are feeling much worse, come back immed.  I would expect you to start turning the corner by Fri so if not, call so I can see you on Sat.  Use the albuterol every 4 hours during the day.  Make sure you are drinking lots of fluid and sleep as much as you can. Use nasal saline spray frequently and hot tea with honey and lemon.    IF you received an x-ray today, you will receive an invoice from [facility]. Please contact [facility] at [phone] with questions or concerns regarding your invoice.   IF you received labwork today, you will receive an invoice from [facility]. Please contact [facility] at [phone] with questions or concerns regarding your invoice.   Our billing staff will not be able to assist you with questions regarding bills from these companies.  You will be contacted with the lab results as soon as they are available. The fastest way to get your results is to activate your My Chart account. Instructions are located on the last page of this paperwork. If you have not heard from [place] regarding the results in 2 weeks, please contact this office.     Community-Acquired Pneumonia, Adult Pneumonia is an infection of the lungs. There are different types of pneumonia. One type can develop while a person is in a hospital. A different type, called community-acquired pneumonia, develops in people who are not, or have not recently been, in the hospital or other health care facility. What are the causes? Pneumonia may be caused by bacteria, viruses, or funguses. Community-acquired pneumonia is often caused by Streptococcus pneumonia bacteria. These bacteria are often passed from one person to another by breathing in droplets from the cough or sneeze of an infected person. What increases the risk? The condition is more likely to develop in:  People who havechronic diseases, such as chronic obstructive pulmonary disease (COPD), asthma, congestive heart failure, cystic  fibrosis, diabetes, or kidney disease.  People who haveearly-stage or late-stage HIV.  People who havesickle cell disease.  People who havehad their spleen removed (splenectomy).  People who havepoor Human resources officer.  People who havemedical conditions that increase the risk of breathing in (aspirating) secretions their own mouth and nose.  People who havea weakened immune system (immunocompromised).  People who smoke.  People whotravel to areas where pneumonia-causing germs commonly exist.  People whoare around animal habitats or animals that have pneumonia-causing germs, including birds, bats, rabbits, cats, and farm animals. What are the signs or symptoms? Symptoms of this condition include:  Adry cough.  A wet (productive) cough.  Fever.  Sweating.  Chest pain, especially when breathing deeply or coughing.  Rapid breathing or difficulty breathing.  Shortness of breath.  Shaking chills.  Fatigue.  Muscle aches. How is this diagnosed? Your health care provider will take a medical history and perform a physical exam. You may also have other tests, including:  Imaging studies of your chest, including X-rays.  Tests to check your blood oxygen level and other blood gases.  Other tests on blood, mucus (sputum), fluid around your lungs (pleural fluid), and urine. If your pneumonia is severe, other tests may be done to identify the specific cause of your illness. How is this treated? The type of treatment that you receive depends on many factors, such as the cause of your pneumonia, the medicines you take, and other medical conditions that you have. For most adults, treatment and recovery from pneumonia may occur at home. In some  cases, treatment must happen in a hospital. Treatment may include:  Antibiotic medicines, if the pneumonia was caused by bacteria.  Antiviral medicines, if the pneumonia was caused by a virus.  Medicines that are given by mouth or  through an IV tube.  Oxygen.  Respiratory therapy. Although rare, treating severe pneumonia may include:  Mechanical ventilation. This is done if you are not breathing well on your own and you cannot maintain a safe blood oxygen level.  Thoracentesis. This procedureremoves fluid around one lung or both lungs to help you breathe better. Follow these instructions at home:  Take over-the-counter and prescription medicines only as told by your health care provider.  Only takecough medicine if you are losing sleep. Understand that cough medicine can prevent your body's natural ability to remove mucus from your lungs.  If you were prescribed an antibiotic medicine, take it as told by your health care provider. Do not stop taking the antibiotic even if you start to feel better.  Sleep in a semi-upright position at night. Try sleeping in a reclining chair, or place a few pillows under your head.  Do not use tobacco products, including cigarettes, chewing tobacco, and e-cigarettes. If you need help quitting, ask your health care provider.  Drink enough water to keep your urine clear or pale yellow. This will help to thin out mucus secretions in your lungs. How is this prevented? There are ways that you can decrease your risk of developing community-acquired pneumonia. Consider getting a pneumococcal vaccine if:  You are older than 73 years of age.  You are older than 73 years of age and are undergoing cancer treatment, have chronic lung disease, or have other medical conditions that affect your immune system. Ask your health care provider if this applies to you. There are different types and schedules of pneumococcal vaccines. Ask your health care provider which vaccination option is best for you. You may also prevent community-acquired pneumonia if you take these actions:  Get an influenza vaccine every year. Ask your health care provider which type of influenza vaccine is best for  you.  Go to the dentist on a regular basis.  Wash your hands often. Use hand sanitizer if soap and water are not available. Contact a health care provider if:  You have a fever.  You are losing sleep because you cannot control your cough with cough medicine. Get help right away if:  You have worsening shortness of breath.  You have increased chest pain.  Your sickness becomes worse, especially if you are an older adult or have a weakened immune system.  You cough up blood. This information is not intended to replace advice given to you by your health care provider. Make sure you discuss any questions you have with your health care provider. Document Released: 10/29/2005 Document Revised: 03/08/2016 Document Reviewed: 02/23/2015 Elsevier Interactive Patient Education  2017 [employer].

## 2016-11-13 NOTE — Progress Notes (Signed)
Subjective:  By signing my name below, I, [name], attest that this documentation has been prepared under the direction and in the presence of Delman Cheadle, MD Electronically Signed: Ladene Artist, ED Scribe 11/13/2016 at 10:57 AM.   Patient ID: Bryan [name], male    DOB: [birth year], 71 y.o.   MRN: [id number]  Chief Complaint  Patient presents with  . possible flu    x3 days; no body aches; denies chills/fever   HPI HPI Comments: Bryan [name] is a 71 y.o. male with a h/o RBBB but no cardiac arythmias and is not followed by cardiology,  who presents to the [facility] complaining of gradually worsening productive cough with brown sputum for the past 3 days. Pt reports associated symptoms of subjective fever, postnasal drip, HA, sinus pressure. He has tried coricidin and tussionex without significant relief. Pt denies chills. He received a flu vaccine this season. Pt is currently on Humira for RA.   Past Medical History:  Diagnosis Date  . Anemia   . Arthritis    hands, knees, cervical area. Back pain. Rheumatoid arthritis- weekly injections.  [name] BPH (benign prostatic hypertrophy)   . Essential tremor   . GERD (gastroesophageal reflux disease)    reports for indigestion he uses mustard   . Hearing deficit    wears hearing aids bilateral  . HTN (hypertension)   . Obesity   . Refusal of blood transfusions as patient is Jehovah's Witness   . Right bundle branch block    history of  . Seizures (HCC)    AS A CHILD. only esential tremors now.  . Sleep apnea    cpap - settings at 9 per patient   . Ulcer [facility])    Current Outpatient Prescriptions on File Prior to Visit  Medication Sig Dispense Refill  . Albuterol Sulfate (PROAIR RESPICLICK) 123XX123 (90 BASE) MCG/ACT AEPB Inhale 2 puffs into the lungs every 4 (four) hours as needed. 1 each 0  . aspirin EC 81 MG tablet Take 81 mg by mouth daily.    [name] lisinopril (PRINIVIL,ZESTRIL) 10 MG tablet Take 10 mg by mouth  daily.    . methotrexate 250 MG/10ML injection Inject 0.6 mLs into the skin every 7 (seven) days.     . primidone (MYSOLINE) 50 MG tablet Take 50 mg by mouth 2 (two) times daily.     . Tamsulosin HCl (FLOMAX) 0.4 MG CAPS Take 0.4 mg by mouth at bedtime.      No current facility-administered medications on file prior to visit.    Allergies  Allergen Reactions  . Other Other (See Comments)    BLOOD PRODUCT REFUSAL (patient is a Jehovah's Witness)   Review of Systems  Constitutional: Positive for fever (subjective). Negative for chills.  HENT: Positive for postnasal drip and sinus pressure.   Respiratory: Positive for cough.   Neurological: Positive for headaches.      Objective:   Physical Exam  Constitutional: He is oriented to person, place, and time. He appears well-developed and well-nourished. No distress.  HENT:  Head: Normocephalic and atraumatic.  Right Ear: Tympanic membrane normal.  Left Ear: Tympanic membrane normal.  Nose: Rhinorrhea present.  Mouth/Throat: Oropharynx is clear and moist.  Eyes: Conjunctivae and EOM are normal.  Neck: Neck supple. No tracheal deviation present.  Cardiovascular: Normal rate.   Pulmonary/Chest: Effort normal. No respiratory distress. He has wheezes.  Decreased air movement throughout, expiratory wheeze, worse bibasilarly  Musculoskeletal: Normal range of  motion.  Lymphadenopathy:       Head (right side): Tonsillar adenopathy present.       Head (left side): Tonsillar adenopathy present.    He has no cervical adenopathy.  Neurological: He is alert and oriented to person, place, and time.  Skin: Skin is warm and dry.  Psychiatric: He has a normal mood and affect. His behavior is normal.  Nursing note and vitals reviewed.  Repeat exam s/p alb neb: Improved air movement throughout; LLL insp rales and exp wheeze persist.  Dg Chest 2 View  Result Date: 11/13/2016 CLINICAL DATA:  Cough, expiratory wheezes greatest at the lung bases. Own  you mirror op will rheumatoid arthritis. History of reactive airway disease and acute respiratory infections. Remote history of smoking. EXAM: CHEST  2 VIEW COMPARISON:  PA and lateral chest x-ray of September 20, 2016 FINDINGS: There is new increased density in the left lower lobe. There is no pleural effusion. The right lung is clear. The heart and pulmonary vascularity are normal. There is mild multilevel degenerative disc disease of the thoracic spine. IMPRESSION: Coarse infrahilar lung markings on the left likely reflects subsegmental atelectasis or early interstitial pneumonia. No pneumonia. Electronically Signed   By: David  Martinique M.D.   On: 11/13/2016 11:24   EKG: Sinus tach. Occasional PACs. No ischemic change.   BP 137/68   Pulse (!) 103   Temp 98.5 F (36.9 C) (Oral)   Resp 18   Ht 5\' 4"  (1.626 m)   Wt 227 lb 6.4 oz (103.1 kg)   SpO2 96%   BMI 39.03 kg/m     Assessment & Plan:   1. ARI (acute respiratory infection)   2. Mild intermittent reactive airway disease with acute exacerbation - start alb qid.  3. Immunosuppressed status ([place]) - due to Humira  4. Tachycardia   5. Irregular cardiac rhythm - increased PACs likely due to acute illness  6. Community acquired pneumonia of left lower lobe of lung ([place]) - CXR was done after neb so suspect early pna rather than atelectasis on imaging. Cover with azithro. Did get flu vaccine this year but immunosuppressed as on Humira for RA (which has helped substantially).    Orders Placed This Encounter  Procedures  . Respiratory virus panel  . DG Chest 2 View    Standing Status:   Future    Number of Occurrences:   1    Standing Expiration Date:   11/13/2017    Order Specific Question:   Reason for Exam (SYMPTOM  OR DIAGNOSIS REQUIRED)    Answer:   cough, exp wheeze worse in bases, on humira for RA, h/o RAD with acute resp infections    Order Specific Question:   Preferred imaging location?    Answer:   External  . Care  order/instruction:    AVS printed - let patient go after viral swab has been taken!  . EKG 12-Lead    Meds ordered this encounter  Medications  . Adalimumab (HUMIRA PEN [place])    Sig: Inject into the skin.  [name] albuterol (PROVENTIL) (2.5 MG/3ML) 0.083% nebulizer solution 2.5 mg  . Albuterol Sulfate (PROAIR RESPICLICK) 123XX123 (90 Base) MCG/ACT AEPB    Sig: Inhale 2 puffs into the lungs every 4 (four) hours as needed.    Dispense:  1 each    Refill:  0  . azithromycin (ZITHROMAX) 500 MG tablet    Sig: Take 1 tablet (500 mg total) by mouth daily.    Dispense:  3 tablet    Refill:  0  . chlorpheniramine-HYDROcodone (TUSSIONEX PENNKINETIC ER) 10-8 MG/5ML SUER    Sig: Take 5 mLs by mouth every 12 (twelve) hours as needed.    Dispense:  140 mL    Refill:  0    I personally performed the services described in this documentation, which was scribed in my presence. The recorded information has been reviewed and considered, and addended by me as needed.   Delman Cheadle, M.D.  [facility] [address] [place], [place] [address] [phone] phone [phone] fax  11/14/16 12:07 AM

## 2016-11-15 LAB — RESPIRATORY VIRUS PANEL

## 2016-12-21 ENCOUNTER — Ambulatory Visit (INDEPENDENT_AMBULATORY_CARE_PROVIDER_SITE_OTHER): Payer: Medicare HMO | Admitting: Family Medicine

## 2016-12-21 ENCOUNTER — Ambulatory Visit (INDEPENDENT_AMBULATORY_CARE_PROVIDER_SITE_OTHER): Payer: Medicare HMO

## 2016-12-21 VITALS — BP 128/60 | HR 105 | Temp 99.0°F | Ht 64.0 in | Wt 228.6 lb

## 2016-12-21 DIAGNOSIS — R062 Wheezing: Secondary | ICD-10-CM

## 2016-12-21 DIAGNOSIS — J209 Acute bronchitis, unspecified: Secondary | ICD-10-CM

## 2016-12-21 DIAGNOSIS — R05 Cough: Secondary | ICD-10-CM | POA: Diagnosis not present

## 2016-12-21 DIAGNOSIS — R000 Tachycardia, unspecified: Secondary | ICD-10-CM

## 2016-12-21 DIAGNOSIS — R0602 Shortness of breath: Secondary | ICD-10-CM

## 2016-12-21 DIAGNOSIS — R059 Cough, unspecified: Secondary | ICD-10-CM

## 2016-12-21 MED ORDER — FLUTICASONE PROPIONATE 50 MCG/ACT NA SUSP: 2.0000 | Freq: Every day | NASAL | 6 refills | Status: DC

## 2016-12-21 MED ORDER — PREDNISONE 20 MG PO TABS: 40.0000 mg | ORAL_TABLET | Freq: Every day | ORAL | 0 refills | Status: AC

## 2016-12-21 MED ORDER — PREDNISONE 20 MG PO TABS: 40.0000 mg | ORAL_TABLET | Freq: Every day | ORAL | 0 refills | Status: DC

## 2016-12-21 NOTE — Progress Notes (Signed)
Chief Complaint  Patient presents with  . Cough    X 3-4 days  . Headache    X 2 days    HPI   Pt reports that he has been coughing for 3-4 days Cough is nonproductive and feels like tightness and chest congestion He occasionally spits up thick mucus When he lays down he can hear a gurgling sound in his chest He reports that he feels short of breath He reports headache for 2 days He reports that he felt very cold overnight  He had a flu vaccine 09/18/2016  He reports that he took some cough medications overnight otc   Back in January he had an xray that showed ateletasis or early pneumonia He took zpak and tussionex in January 2018 He stayed home for 2 weeks and got better but then his symptoms returned   Past Medical History:  Diagnosis Date  . Anemia   . Arthritis    hands, knees, cervical area. Back pain. Rheumatoid arthritis- weekly injections.  [name] BPH (benign prostatic hypertrophy)   . Essential tremor   . GERD (gastroesophageal reflux disease)    reports for indigestion he uses mustard   . Hearing deficit    wears hearing aids bilateral  . HTN (hypertension)   . Obesity   . Refusal of blood transfusions as patient is Jehovah's Witness   . Right bundle branch block    history of  . Seizures (HCC)    AS A CHILD. only esential tremors now.  . Sleep apnea    cpap - settings at 9 per patient   . Ulcer [facility])     Current Outpatient Prescriptions  Medication Sig Dispense Refill  . Adalimumab (HUMIRA PEN [place]) Inject into the skin.    . Albuterol Sulfate (PROAIR RESPICLICK) 123XX123 (90 Base) MCG/ACT AEPB Inhale 2 puffs into the lungs every 4 (four) hours as needed. 1 each 0  . aspirin EC 81 MG tablet Take 81 mg by mouth daily.    [name] azithromycin (ZITHROMAX) 500 MG tablet Take 1 tablet (500 mg total) by mouth daily. 3 tablet 0  . chlorpheniramine-HYDROcodone (TUSSIONEX PENNKINETIC ER) 10-8 MG/5ML SUER Take 5 mLs by mouth every 12 (twelve) hours as needed. 140 mL 0  .  lisinopril (PRINIVIL,ZESTRIL) 10 MG tablet Take 10 mg by mouth daily.    . methotrexate 250 MG/10ML injection Inject 0.6 mLs into the skin every 7 (seven) days.     . primidone (MYSOLINE) 50 MG tablet Take 50 mg by mouth 2 (two) times daily.     . Tamsulosin HCl (FLOMAX) 0.4 MG CAPS Take 0.4 mg by mouth at bedtime.     . fluticasone (FLONASE) 50 MCG/ACT nasal spray Place 2 sprays into both nostrils daily. 16 g 6  . predniSONE (DELTASONE) 20 MG tablet Take 2 tablets (40 mg total) by mouth daily with breakfast. 8 tablet 0   No current facility-administered medications for this visit.     Allergies:  Allergies  Allergen Reactions  . Other Other (See Comments)    BLOOD PRODUCT REFUSAL (patient is a Jehovah's Witness)    Past Surgical History:  Procedure Laterality Date  . ANTERIOR CERVICAL DECOMP/DISCECTOMY FUSION N/A 03/30/2013   Procedure: ANTERIOR CERVICAL DECOMPRESSION/DISCECTOMY FUSION 2 LEVELS;  Surgeon: Otilio Connors, MD;  Location: [place] [facility];  Service: Neurosurgery;  Laterality: N/A;  C4-5 C5-6 Anterior cervical decompression/diskectomy/fusion/Allograft/Plate  . CHOLECYSTECTOMY N/A 12/07/2013   Procedure: LAPAROSCOPIC CHOLECYSTECTOMY WITH INTRAOPERATIVE CHOLANGIOGRAM;  Surgeon: Adin Hector,  MD;  Location: [facility];  Service: General;  Laterality: N/A;  . COLONOSCOPY    . DECOMPRESSIVE LUMBAR LAMINECTOMY LEVEL 2 N/A 02/15/2015   Procedure: COMPLETE DECOMPRESSIVE LUMBAR LAMINECTOMY L4-L5/ FORAMINOTOMY TO L4 NERVE ROOT AND L5 NERVE ROOT BILATERALLY;  Surgeon: [name], MD;  Location: [facility];  Service: Orthopedics;  Laterality: N/A;  . EYE SURGERY     right, growth excision  . LUMBAR LAMINECTOMY/DECOMPRESSION MICRODISCECTOMY Left 01/18/2016   Procedure:  DECOMPRESSION L4-L5 MICRODISCECTOMY L4-L5 ON LEFT FOR SPINAL STENOSIS;  Surgeon: [name], MD;  Location: [facility];  Service: Orthopedics;  Laterality: Left;  . SPINE SURGERY    . TONSILLECTOMY    . VASECTOMY    . WRIST  GANGLION EXCISION Left     Social History   Social History  . Marital status: Married    Spouse name: N/A  . Number of children: 2  . Years of education: N/A   Occupational History  . bus driver Retired   Social History Main Topics  . Smoking status: Former Smoker    Types: Cigarettes    Quit date: 12/14/1972  . Smokeless tobacco: Never Used  . Alcohol use Yes     Comment: occasionally  . Drug use: No  . Sexual activity: Yes    Birth control/ protection: None   Other Topics Concern  . None   Social History Narrative  . None    ROS  Objective: Vitals:   12/21/16 1028  BP: 128/60  Pulse: (!) 105  Temp: 99 F (37.2 C)  TempSrc: Oral  SpO2: 94%  Weight: 228 lb 9.6 oz (103.7 kg)  Height: 5\' 4"  (1.626 m)    Physical Exam  Constitutional: He is oriented to person, place, and time. He appears well-developed and well-nourished.  HENT:  Head: Normocephalic and atraumatic.  Nose: Nose normal.  Mouth/Throat: Oropharynx is clear and moist. No oropharyngeal exudate.  Eyes: Conjunctivae and EOM are normal.  Neck: Normal range of motion. Neck supple.  Cardiovascular: A regularly irregular rhythm present. Tachycardia present.   Pulmonary/Chest: Effort normal. No respiratory distress. He has wheezes. He has no rales. He exhibits no tenderness.  Neurological: He is alert and oriented to person, place, and time.  Skin: Skin is warm. Capillary refill takes less than 2 seconds. No erythema.  Psychiatric: He has a normal mood and affect. His behavior is normal. Judgment and thought content normal.        COMPARISON:  PA and lateral chest x-ray of September 20, 2016  FINDINGS: There is new increased density in the left lower lobe. There is no pleural effusion. The right lung is clear. The heart and pulmonary vascularity are normal. There is mild multilevel degenerative disc disease of the thoracic spine.  IMPRESSION: Coarse infrahilar lung markings on the left likely  reflects subsegmental atelectasis or early interstitial pneumonia. No pneumonia.   Electronically Signed   By: David  Martinique M.D.   On: 11/13/2016 11:24  CLINICAL DATA:  Shortness of breath with wheezing and cough  EXAM: CHEST  2 VIEW  COMPARISON:  November 13, 2016  FINDINGS: There is no edema or consolidation. The heart size and pulmonary vascularity are normal. No adenopathy. There is postoperative change in the lower cervical region. No bone lesions.  IMPRESSION: No edema or consolidation.   Electronically Signed   By: Lowella Grip III M.D.   On: 12/21/2016 10:59  Assessment and Plan Shango was seen today for cough and headache.  Diagnoses and all  orders for this visit:  Cough Shortness of breath Wheezing  symptoms consistent with bronchitis -     DG Chest 2 View  Acute bronchitis, unspecified organism Repeated xray to see if this is a progression of his episode from January 2018 His xray shows that his previously findings were resolved Did not show pneumonia His wheezing and shortness of breath is likely due to acute bronchitis viral source Advised him to take Prednisone 40mg  daily for 4 days Use his albuterol 4-6 hours as needed  flonase daily Continue OTC cough medication but avoid the ones with dextromorphan, and phenylephrine   Tachycardia with what sounds like PVCs -  Advised hydration and avoiding cough meds with stimulants  Other orders -     fluticasone (FLONASE) 50 MCG/ACT nasal spray; Place 2 sprays into both nostrils daily. -     predniSONE (DELTASONE) 20 MG tablet; Take 2 tablets (40 mg total) by mouth daily with breakfast.    Mykeisha Dysert A Nolon Rod

## 2016-12-21 NOTE — Patient Instructions (Addendum)
   IF you received an x-ray today, you will receive an invoice from [facility]. Please contact [facility] at [phone] with questions or concerns regarding your invoice.   IF you received labwork today, you will receive an invoice from [facility]. Please contact [facility] at [phone] with questions or concerns regarding your invoice.   Our billing staff will not be able to assist you with questions regarding bills from these companies.  You will be contacted with the lab results as soon as they are available. The fastest way to get your results is to activate your My Chart account. Instructions are located on the last page of this paperwork. If you have not heard from us regarding the results in 2 weeks, please contact this office.      Acute Bronchitis, Adult Acute bronchitis is sudden (acute) swelling of the air tubes (bronchi) in the lungs. Acute bronchitis causes these tubes to fill with mucus, which can make it hard to breathe. It can also cause coughing or wheezing. In adults, acute bronchitis usually goes away within 2 weeks. A cough caused by bronchitis may last up to 3 weeks. Smoking, allergies, and asthma can make the condition worse. Repeated episodes of bronchitis may cause further lung problems, such as chronic obstructive pulmonary disease (COPD). What are the causes? This condition can be caused by germs and by substances that irritate the lungs, including:  Cold and flu viruses. This condition is most often caused by the same virus that causes a cold.  Bacteria.  Exposure to tobacco smoke, dust, fumes, and air pollution.  What increases the risk? This condition is more likely to develop in people who:  Have close contact with someone with acute bronchitis.  Are exposed to lung irritants, such as tobacco smoke, dust, fumes, and vapors.  Have a weak immune system.  Have a respiratory condition such as asthma.  What are the signs or  symptoms? Symptoms of this condition include:  A cough.  Coughing up clear, yellow, or green mucus.  Wheezing.  Chest congestion.  Shortness of breath.  A fever.  Body aches.  Chills.  A sore throat.  How is this diagnosed? This condition is usually diagnosed with a physical exam. During the exam, your health care provider may order tests, such as chest X-rays, to rule out other conditions. He or she may also:  Test a sample of your mucus for bacterial infection.  Check the level of oxygen in your blood. This is done to check for pneumonia.  Do a chest X-ray or lung function testing to rule out pneumonia and other conditions.  Perform blood tests.  Your health care provider will also ask about your symptoms and medical history. How is this treated? Most cases of acute bronchitis clear up over time without treatment. Your health care provider may recommend:  Drinking more fluids. Drinking more makes your mucus thinner, which may make it easier to breathe.  Taking a medicine for a fever or cough.  Taking an antibiotic medicine.  Using an inhaler to help improve shortness of breath and to control a cough.  Using a cool mist vaporizer or humidifier to make it easier to breathe.  Follow these instructions at home: Medicines  Take over-the-counter and prescription medicines only as told by your health care provider.  If you were prescribed an antibiotic, take it as told by your health care provider. Do not stop taking the antibiotic even if you start to feel better. General instructions    Get plenty of rest.  Drink enough fluids to keep your urine clear or pale yellow.  Avoid smoking and secondhand smoke. Exposure to cigarette smoke or irritating chemicals will make bronchitis worse. If you smoke and you need help quitting, ask your health care provider. Quitting smoking will help your lungs heal faster.  Use an inhaler, cool mist vaporizer, or humidifier as told  by your health care provider.  Keep all follow-up visits as told by your health care provider. This is important. How is this prevented? To lower your risk of getting this condition again:  Wash your hands often with soap and water. If soap and water are not available, use hand sanitizer.  Avoid contact with people who have cold symptoms.  Try not to touch your hands to your mouth, nose, or eyes.  Make sure to get the flu shot every year.  Contact a health care provider if:  Your symptoms do not improve in 2 weeks of treatment. Get help right away if:  You cough up blood.  You have chest pain.  You have severe shortness of breath.  You become dehydrated.  You faint or keep feeling like you are going to faint.  You keep vomiting.  You have a severe headache.  Your fever or chills gets worse. This information is not intended to replace advice given to you by your health care provider. Make sure you discuss any questions you have with your health care provider. Document Released: 12/06/2004 Document Revised: 05/23/2016 Document Reviewed: 04/18/2016 Elsevier Interactive Patient Education  2017 [employer] Inc.  

## 2016-12-25 DIAGNOSIS — I7300 Raynaud's syndrome without gangrene: Secondary | ICD-10-CM | POA: Diagnosis not present

## 2016-12-25 DIAGNOSIS — M0579 Rheumatoid arthritis with rheumatoid factor of multiple sites without organ or systems involvement: Secondary | ICD-10-CM | POA: Diagnosis not present

## 2016-12-25 DIAGNOSIS — Z6838 Body mass index (BMI) 38.0-38.9, adult: Secondary | ICD-10-CM | POA: Diagnosis not present

## 2016-12-25 DIAGNOSIS — M2550 Pain in unspecified joint: Secondary | ICD-10-CM | POA: Diagnosis not present

## 2016-12-25 DIAGNOSIS — E669 Obesity, unspecified: Secondary | ICD-10-CM | POA: Diagnosis not present

## 2016-12-25 DIAGNOSIS — M5416 Radiculopathy, lumbar region: Secondary | ICD-10-CM | POA: Diagnosis not present

## 2016-12-28 ENCOUNTER — Ambulatory Visit (INDEPENDENT_AMBULATORY_CARE_PROVIDER_SITE_OTHER): Payer: Medicare HMO | Admitting: Physician Assistant

## 2016-12-28 ENCOUNTER — Ambulatory Visit (INDEPENDENT_AMBULATORY_CARE_PROVIDER_SITE_OTHER): Payer: Medicare HMO

## 2016-12-28 ENCOUNTER — Encounter: Payer: Self-pay | Admitting: Physician Assistant

## 2016-12-28 VITALS — BP 118/72 | HR 106 | Temp 100.0°F | Resp 16 | Ht 65.0 in | Wt 230.0 lb

## 2016-12-28 DIAGNOSIS — R509 Fever, unspecified: Secondary | ICD-10-CM

## 2016-12-28 DIAGNOSIS — I499 Cardiac arrhythmia, unspecified: Secondary | ICD-10-CM

## 2016-12-28 DIAGNOSIS — R05 Cough: Secondary | ICD-10-CM

## 2016-12-28 DIAGNOSIS — R059 Cough, unspecified: Secondary | ICD-10-CM

## 2016-12-28 DIAGNOSIS — R69 Illness, unspecified: Secondary | ICD-10-CM | POA: Diagnosis not present

## 2016-12-28 LAB — POCT CBC
GRANULOCYTE PERCENT: 81.1 % — AB (ref 37–80)
HCT, POC: 36.1 % — AB (ref 43.5–53.7)
Hemoglobin: 12.3 g/dL — AB (ref 14.1–18.1)
Lymph, poc: 1 (ref 0.6–3.4)
MCH: 32.9 pg — AB (ref 27–31.2)
MCHC: 34.2 g/dL (ref 31.8–35.4)
MCV: 96.4 fL (ref 80–97)
MID (CBC): 0.3 (ref 0–0.9)
MPV: 6.9 fL (ref 0–99.8)
POC Granulocyte: 5.8 (ref 2–6.9)
POC LYMPH PERCENT: 14.6 %L (ref 10–50)
POC MID %: 4.3 % (ref 0–12)
Platelet Count, POC: 269 10*3/uL (ref 142–424)
RBC: 3.74 M/uL — AB (ref 4.69–6.13)
RDW, POC: 17.2 %
WBC: 7.1 10*3/uL (ref 4.6–10.2)

## 2016-12-28 MED ORDER — AEROCHAMBER MV MISC: 2 refills | Status: DC

## 2016-12-28 MED ORDER — DOXYCYCLINE HYCLATE 100 MG PO CAPS: 100.0000 mg | ORAL_CAPSULE | Freq: Two times a day (BID) | ORAL | 0 refills | Status: AC

## 2016-12-28 MED ORDER — ALBUTEROL SULFATE HFA 108 (90 BASE) MCG/ACT IN AERS: 2.0000 | INHALATION_SPRAY | RESPIRATORY_TRACT | 1 refills | Status: DC | PRN

## 2016-12-28 NOTE — Patient Instructions (Signed)
     IF you received an x-ray today, you will receive an invoice from [facility]. Please contact [facility] at [phone] with questions or concerns regarding your invoice.   IF you received labwork today, you will receive an invoice from [facility]. Please contact [facility] at [phone] with questions or concerns regarding your invoice.   Our billing staff will not be able to assist you with questions regarding bills from these companies.  You will be contacted with the lab results as soon as they are available. The fastest way to get your results is to activate your My Chart account. Instructions are located on the last page of this paperwork. If you have not heard from us regarding the results in 2 weeks, please contact this office.     

## 2016-12-28 NOTE — Progress Notes (Signed)
Patient ID: Bryan [name], male    DOB: [birth year], 71 y.o.   MRN: [id number]  PCP: Delman Cheadle, MD  Chief Complaint  Patient presents with  . Fever    Symptoms continous x 1 month  . Cough  . Shortness of Breath    Subjective:   Presents for evaluation of fever, cough and shortness of breath. He is accompanied by his wife, [place].  "I keep getting this bronchitis, every few days. When I start feeling good, I get out and I guess I overdo it."  He received a seasonal flu vaccine. He is on methotrexate and Humira for RA.  He was seen 11/13/2016 with similar symptoms x 3 days. At that time, the cough was productive of green/brown sputum. He had post-nasal drainage, headache, sinus pressure. OTC Coricidin and leftover Tussionex wasn't effective. On exam he had reduced air movement and expiratory wheeze. He had tonsillar adenopathy. CXR revealed possible atelectasis vs early infiltrate. EKG revealed sinus tachycardia, RBBB (not new) and occasional PACs. He was prescribed azithromycin.  His symptoms resolved and he was well x 2 weeks.  On 12/21/16 he returned with recurrent symptoms x 3-4 days. He reported non-productive cough, chest tightness and chest congestion, occasionally spitting up thick mucous. Associated SOB, 2 days of headache, feeling cold. Heart rhythm was regularly irregular, mildly tachycardic. Repeat CXR revealed no consolidation. He was advised to resume the albuterol inhaler and Tussionex he had at home.  He reports that he couldn't find the inhaler and "was too lazy" to take the Tussionex.  Coughing, non-productive. "Feels like it's in my lungs and I can't cough it up." No vomiting. No nausea. Fever yesterday 101. This AM 100. He feels better with Aleve.  No chest pain. No ear pain. No sore throat. Some nasal congestion and he relates that to the use of CPAP. Not worse than usual.   Review of Systems As above.    Patient Active Problem List   Diagnosis  Date Noted  . Lumbago 06/21/2016  . Spinal stenosis, lumbar region, with neurogenic claudication 02/15/2015  . Obesity (BMI 30-39.9) 12/07/2013  . Refusal of blood transfusions as patient is Jehovah's Witness 12/07/2013  . Rheumatoid arthritis ([place]) 10/26/2012  . Nonspecific abnormal finding in stool contents 07/28/2012  . Multiple joint pain 06/05/2012  . History of tobacco use-  06/05/2012  . Asthmatic bronchitis 12/15/2011  . BPH (benign prostatic hyperplasia) 12/15/2011  . Essential tremor 12/15/2011  . HTN (hypertension) 12/15/2011     Prior to Admission medications   Medication Sig Start Date End Date Taking? Authorizing Provider  Adalimumab (HUMIRA PEN [place]) Inject into the skin.   Yes Historical Provider, MD  aspirin EC 81 MG tablet Take 81 mg by mouth daily.   Yes Historical Provider, MD  folic acid (FOLVITE) 1 MG tablet Take 1 mg by mouth daily.   Yes Historical Provider, MD  lisinopril (PRINIVIL,ZESTRIL) 10 MG tablet Take 10 mg by mouth daily.   Yes Historical Provider, MD  methotrexate 250 MG/10ML injection Inject 0.6 mLs into the skin every 7 (seven) days.  12/09/15  Yes Historical Provider, MD  primidone (MYSOLINE) 50 MG tablet Take 50 mg by mouth 2 (two) times daily.  04/05/13  Yes Darlyne Russian, MD  Tamsulosin HCl (FLOMAX) 0.4 MG CAPS Take 0.4 mg by mouth at bedtime.    Yes Historical Provider, MD  Albuterol Sulfate (PROAIR RESPICLICK) 123XX123 (90 Base) MCG/ACT AEPB Inhale 2 puffs into the lungs every 4 (  four) hours as needed. Patient not taking: Reported on 12/28/2016 11/13/16   Shawnee Knapp, MD  chlorpheniramine-HYDROcodone [facility]) 10-8 MG/5ML SUER Take 5 mLs by mouth every 12 (twelve) hours as needed. Patient not taking: Reported on 12/28/2016 11/13/16   Shawnee Knapp, MD  fluticasone [facility]) 50 MCG/ACT nasal spray Place 2 sprays into both nostrils daily. Patient not taking: Reported on 12/28/2016 12/21/16   [name], MD     Allergies  Allergen Reactions    . Other Other (See Comments)    BLOOD PRODUCT REFUSAL (patient is a Jehovah's Witness)       Objective:  Physical Exam  Constitutional: He is oriented to person, place, and time. He appears well-developed and well-nourished. He is active and cooperative. No distress.  BP 118/72   Pulse (!) 106   Temp 100 F (37.8 C) (Oral)   Resp 16   Ht 5\' 5"  (1.651 m)   Wt 230 lb (104.3 kg)   SpO2 95%   BMI 38.27 kg/m   HENT:  Head: Normocephalic and atraumatic.  Right Ear: Hearing, tympanic membrane, external ear and ear canal normal.  Left Ear: Hearing, tympanic membrane, external ear and ear canal normal.  Nose: Nose normal.  Mouth/Throat: Uvula is midline, oropharynx is clear and moist and mucous membranes are normal.  Eyes: Conjunctivae, EOM and lids are normal. Pupils are equal, round, and reactive to light. No scleral icterus.  Neck: Normal range of motion, full passive range of motion without pain and phonation normal. Neck supple. No thyromegaly present.  Cardiovascular: Normal rate and normal heart sounds.  An irregularly irregular rhythm present.  Pulses:      Radial pulses are 2+ on the right side, and 2+ on the left side.  Pulmonary/Chest: Effort normal. He has no decreased breath sounds. He has no wheezes. He has no rhonchi. He has no rales.  Breath sounds are coarse in the bases.  Lymphadenopathy:       Head (right side): No tonsillar, no preauricular, no posterior auricular and no occipital adenopathy present.       Head (left side): No tonsillar, no preauricular, no posterior auricular and no occipital adenopathy present.    He has no cervical adenopathy.       Right: No supraclavicular adenopathy present.       Left: No supraclavicular adenopathy present.  Neurological: He is alert and oriented to person, place, and time. No sensory deficit.  Skin: Skin is warm, dry and intact. No rash noted. No cyanosis or erythema. Nails show no clubbing.  Psychiatric: He has a normal  mood and affect. His speech is normal and behavior is normal.    EKG reviewed with [name]. Sinus rhythm with frequent PACs. RBBB. Compared to tracing 11/13/2016, unchanged.  Dg Chest 2 View  Result Date: 12/28/2016 CLINICAL DATA:  Intermittent cough and wheezing for 6 weeks. EXAM: CHEST  2 VIEW COMPARISON:  12/21/2016 chest radiograph. FINDINGS: Surgical hardware from ACDF overlies the lower cervical spine. Stable cardiomediastinal silhouette with normal heart size. No pneumothorax. No pleural effusion. No acute consolidative airspace disease. No pulmonary edema. Surgical clips in the upper abdomen. IMPRESSION: No active cardiopulmonary disease. Electronically Signed   By: Ilona Sorrel M.D.   On: 12/28/2016 10:33    Results for orders placed or performed in visit on 12/28/16  POCT CBC  Result Value Ref Range   WBC 7.1 4.6 - 10.2 K/uL   Lymph, poc 1.0 0.6 - 3.4  POC LYMPH PERCENT 14.6 10 - 50 %L   MID (cbc) 0.3 0 - 0.9   POC MID % 4.3 0 - 12 %M   POC Granulocyte 5.8 2 - 6.9   Granulocyte percent 81.1 (A) 37 - 80 %G   RBC 3.74 (A) 4.69 - 6.13 M/uL   Hemoglobin 12.3 (A) 14.1 - 18.1 g/dL   HCT, POC 36.1 (A) 43.5 - 53.7 %   MCV 96.4 80 - 97 fL   MCH, POC 32.9 (A) 27 - 31.2 pg   MCHC 34.2 31.8 - 35.4 g/dL   RDW, POC 17.2 %   Platelet Count, POC 269 142 - 424 K/uL   MPV 6.9 0 - 99.8 fL   This anemia is stable.        Assessment & Plan:   1. Cough 2. Fever, unspecified fever cause Again, CXR reveals no infiltrate. Given the chronic congestion, though mild, I wonder if this is sinus drainage. Cover for possible atypicals, given that he is higher risk due to immunosuppressive medications, age and underlying medical conditions. - albuterol (PROVENTIL HFA;VENTOLIN HFA) 108 (90 Base) MCG/ACT inhaler; Inhale 2 puffs into the lungs every 4 (four) hours as needed for wheezing or shortness of breath (cough, shortness of breath or wheezing.).  Dispense: 1 Inhaler; Refill: 1 -  Spacer/Aero-Holding Chambers (AEROCHAMBER MV) inhaler; Use as instructed  Dispense: 1 each; Refill: 2 - doxycycline (VIBRAMYCIN) 100 MG capsule; Take 1 capsule (100 mg total) by mouth 2 (two) times daily.  Dispense: 20 capsule; Refill: 0 - DG Chest 2 View; Future - POCT CBC  3. Irregular heart rhythm Due to multiple PACs.  - EKG 12-Lead   Fara Chute, PA-C Physician Assistant-Certified [facility] at [place]

## 2017-01-01 ENCOUNTER — Ambulatory Visit (INDEPENDENT_AMBULATORY_CARE_PROVIDER_SITE_OTHER): Payer: Medicare HMO | Admitting: Student

## 2017-01-01 ENCOUNTER — Telehealth: Payer: Self-pay

## 2017-01-01 DIAGNOSIS — J111 Influenza due to unidentified influenza virus with other respiratory manifestations: Secondary | ICD-10-CM

## 2017-01-01 DIAGNOSIS — R69 Illness, unspecified: Secondary | ICD-10-CM | POA: Diagnosis not present

## 2017-01-01 NOTE — Progress Notes (Signed)
Subjective:    Patient ID: Bryan [name], male    DOB: 1946/06/27, 71 y.o.   MRN: [id number]  HPI He complains of fever, has decreased energy, malaise, cough which is improving.  Denies myalgias.  He was seen on Friday and diagnosed with a fever of unknown source.  He was placed on doxycycline To treat atypical's. He has noticed that his fever is now gone away and at his cough is improved. He is mainly concerned about fatigue. His wife and himself ask if he should be on B12 injections. This fatigue really just started recently with all the recent infections.  He is concerned because he cannot take as multivitamin to the Botox the site: He is wondering if that is what is causing his fatigue.  On Humira for RA- last dose 12/27/16, sees Leafy Kindle.     PMHx - Updated and reviewed.  Contributory factors include: Negative PSHx - Updated and reviewed.  Contributory factors include:  Negative FHx - Updated and reviewed.  Contributory factors include:  Negative Social Hx - Updated and reviewed. Contributory factors include: Negative Medications - reviewed   Review of Systems  Constitutional: Positive for activity change and fatigue. Negative for chills, fever and unexpected weight change.  HENT: Positive for congestion. Negative for ear pain, sinus pressure and sore throat.   Eyes: Negative for pain and itching.  Respiratory: Positive for cough. Negative for shortness of breath, wheezing and stridor.   Cardiovascular: Negative for chest pain, palpitations and leg swelling.  Gastrointestinal: Negative for abdominal pain, diarrhea and nausea.  Genitourinary: Negative for dysuria and urgency.  Musculoskeletal: Negative for joint swelling and myalgias.  Skin: Negative for pallor, rash and wound.  Neurological: Negative for dizziness and weakness.  All other systems reviewed and are negative.      Objective:   Physical Exam  Constitutional: He is oriented to person, place, and time. He  appears well-developed and well-nourished. No distress.  HENT:  Head: Normocephalic and atraumatic.  Right Ear: External ear normal.  Left Ear: External ear normal.  Mouth/Throat: Oropharynx is clear and moist. No oropharyngeal exudate.  Eyes: Conjunctivae are normal. Right eye exhibits no discharge. Left eye exhibits no discharge. No scleral icterus.  Neck: Normal range of motion. Neck supple.  Cardiovascular: Normal rate and regular rhythm.  Exam reveals no gallop and no friction rub.   No murmur heard. Pulmonary/Chest: Effort normal and breath sounds normal. No respiratory distress. He has no wheezes. He has no rales. He exhibits no tenderness.  Musculoskeletal: He exhibits no edema or deformity.  Lymphadenopathy:    He has no cervical adenopathy.  Neurological: He is alert and oriented to person, place, and time.  Skin: Skin is warm. No rash noted. He is not diaphoretic. No erythema. No pallor.  Psychiatric: He has a normal mood and affect. His behavior is normal. Judgment and thought content normal.   BP 126/64 (BP Location: Right Arm, Patient Position: Sitting, Cuff Size: Large)   Pulse 97   Temp 98.6 F (37 C) (Oral)   Resp 16   Ht 5\' 4"  (1.626 m)   Wt 225 lb (102.1 kg)   SpO2 (!) 64%   BMI 38.62 kg/m         Assessment & Plan:  Influenza-like illness Did counsel the patient about his fatigue and flulike symptoms. He does appear to be getting better. Counseled that he can take time for him to have more energy given that he had a recent  illness, is on Humira with a rheumatologic condition, is on antibiotics. He expressed understanding. Gave reassurance that being off vitamins for a week will not cause severe malnourishment enough to cause fatigue.  Offered to check lab work at this time, however patient and wife would like to wait until his current illness has resolved. Did counsel that his fatigue last 3 weeks after illness resolves.  He'll follow-up as he is not improved by  that time.

## 2017-01-01 NOTE — Patient Instructions (Addendum)
  You are tired from being on antibiotics, on humira, and fighting an illness.  Follow up if not improved in 2 weeks.  Follow up sooner if having increased shortness of breath, fevers, chills, worsening symptoms.   IF you received an x-ray today, you will receive an invoice from [facility]. Please contact [facility] at [phone] with questions or concerns regarding your invoice.   IF you received labwork today, you will receive an invoice from [place]. Please contact [facility] at [phone] with questions or concerns regarding your invoice.   Our billing staff will not be able to assist you with questions regarding bills from these companies.  You will be contacted with the lab results as soon as they are available. The fastest way to get your results is to activate your My Chart account. Instructions are located on the last page of this paperwork. If you have not heard from [place] regarding the results in 2 weeks, please contact this office.

## 2017-01-01 NOTE — Telephone Encounter (Signed)
error 

## 2017-01-02 NOTE — Assessment & Plan Note (Signed)
Did counsel the patient about his fatigue and flulike symptoms. He does appear to be getting better. Counseled that he can take time for him to have more energy given that he had a recent illness, is on Humira with a rheumatologic condition, is on antibiotics. He expressed understanding. Gave reassurance that being off vitamins for a week will not cause severe malnourishment enough to cause fatigue.  Offered to check lab work at this time, however patient and wife would like to wait until his current illness has resolved. Did counsel that his fatigue last 3 weeks after illness resolves.  He'll follow-up as he is not improved by that time.

## 2017-01-07 ENCOUNTER — Ambulatory Visit (INDEPENDENT_AMBULATORY_CARE_PROVIDER_SITE_OTHER): Payer: Medicare HMO | Admitting: Family Medicine

## 2017-01-07 VITALS — BP 142/70 | HR 100 | Temp 98.3°F | Resp 18 | Ht 64.0 in | Wt 225.0 lb

## 2017-01-07 DIAGNOSIS — Z5181 Encounter for therapeutic drug level monitoring: Secondary | ICD-10-CM

## 2017-01-07 DIAGNOSIS — R05 Cough: Secondary | ICD-10-CM

## 2017-01-07 DIAGNOSIS — R059 Cough, unspecified: Secondary | ICD-10-CM

## 2017-01-07 DIAGNOSIS — I10 Essential (primary) hypertension: Secondary | ICD-10-CM | POA: Diagnosis not present

## 2017-01-07 MED ORDER — HYDROCHLOROTHIAZIDE 25 MG PO TABS: 25.0000 mg | ORAL_TABLET | Freq: Every day | ORAL | 0 refills | Status: DC

## 2017-01-07 MED ORDER — METOPROLOL SUCCINATE ER 50 MG PO TB24: 50.0000 mg | ORAL_TABLET | Freq: Every day | ORAL | 0 refills | Status: DC

## 2017-01-07 NOTE — Patient Instructions (Addendum)
     IF you received an x-ray today, you will receive an invoice from [facility]. Please contact [facility] at [phone] with questions or concerns regarding your invoice.   IF you received labwork today, you will receive an invoice from [place]. Please contact [facility] at [phone] with questions or concerns regarding your invoice.   Our billing staff will not be able to assist you with questions regarding bills from these companies.  You will be contacted with the lab results as soon as they are available. The fastest way to get your results is to activate your My Chart account. Instructions are located on the last page of this paperwork. If you have not heard from [place] regarding the results in 2 weeks, please contact this office.      Hypertension Hypertension is another name for high blood pressure. High blood pressure forces your heart to work harder to pump blood. A blood pressure reading has two numbers, which includes a higher number over a lower number (example: 110/72). Follow these instructions at home:  Have your blood pressure rechecked by your doctor.  Only take medicine as told by your doctor. Follow the directions carefully. The medicine does not work as well if you skip doses. Skipping doses also puts you at risk for problems.  Do not smoke.  Monitor your blood pressure at home as told by your doctor. Contact a doctor if:  You think you are having a reaction to the medicine you are taking.  You have repeat headaches or feel dizzy.  You have puffiness (swelling) in your ankles.  You have trouble with your vision. Get help right away if:  You get a very bad headache and are confused.  You feel weak, numb, or faint.  You get chest or belly (abdominal) pain.  You throw up (vomit).  You cannot breathe very well. This information is not intended to replace advice given to you by your health care provider. Make sure you discuss any  questions you have with your health care provider. Document Released: 04/16/2008 Document Revised: 04/05/2016 Document Reviewed: 08/21/2013 Elsevier Interactive Patient Education  2017 [employer].

## 2017-01-07 NOTE — Progress Notes (Signed)
Chief Complaint  Patient presents with  . Cough  . Shortness of Breath    HPI    Cough Pt was seen on 11/13/16, again on 12/21/16 and 12/28/16 and 01/07/17 He has completed zpak in January and doxycycline in February, he also had prednisone taper, flonase and albuterol without ANY improvement. He takes lisinopril 10mg  for hypertension for about a year He reports that he is still coughing and the cough is non-productive and feels like it is causing chest tightness and congestion. He rarely spits up mucus. He also had cough medication with codeine He had cxr done 11/13/2016, 12/21/16 and 12/28/16 and there was no pleural effusion, cardiomegaly or pulmonary edema without any signs of consolidation  He had a flu vaccine 09/18/2016  Hypertension: Patient here for follow-up of elevated blood pressure. He does intermittent exercising and is adherent to low salt diet.  Blood pressure is well controlled at home.  BP Readings from Last 3 Encounters:  01/07/17 (!) 142/70  01/01/17 126/64  12/28/16 118/72   He was previously on metoprolol 50mg  and was switched from metoprolol to lisinopril without an explanation.  He was previously on losartan with hydrochlorothiazide     Past Medical History:  Diagnosis Date  . Anemia   . Arthritis    hands, knees, cervical area. Back pain. Rheumatoid arthritis- weekly injections.  [name] BPH (benign prostatic hypertrophy)   . Essential tremor   . GERD (gastroesophageal reflux disease)    reports for indigestion he uses mustard   . Hearing deficit    wears hearing aids bilateral  . HTN (hypertension)   . Obesity   . Refusal of blood transfusions as patient is Jehovah's Witness   . Right bundle branch block    history of  . Seizures (HCC)    AS A CHILD. only esential tremors now.  . Sleep apnea    cpap - settings at 9 per patient   . Ulcer [facility])     Current Outpatient Prescriptions  Medication Sig Dispense Refill  . aspirin EC 81 MG tablet Take 81 mg  by mouth daily.    . chlorpheniramine-HYDROcodone (TUSSIONEX PENNKINETIC ER) 10-8 MG/5ML SUER Take 5 mLs by mouth every 12 (twelve) hours as needed. 140 mL 0  . doxycycline (VIBRAMYCIN) 100 MG capsule Take 1 capsule (100 mg total) by mouth 2 (two) times daily. 20 capsule 0  . fluticasone (FLONASE) 50 MCG/ACT nasal spray Place 2 sprays into both nostrils daily. 16 g 6  . folic acid (FOLVITE) 1 MG tablet Take 1 mg by mouth daily.    . methotrexate 250 MG/10ML injection Inject 0.6 mLs into the skin every 7 (seven) days.     . primidone (MYSOLINE) 50 MG tablet Take 50 mg by mouth 2 (two) times daily.     [name] Spacer/Aero-Holding Chambers (AEROCHAMBER MV) inhaler Use as instructed 1 each 2  . Tamsulosin HCl (FLOMAX) 0.4 MG CAPS Take 0.4 mg by mouth at bedtime.     . Adalimumab (HUMIRA PEN [place]) Inject into the skin.    [name] albuterol (PROVENTIL HFA;VENTOLIN HFA) 108 (90 Base) MCG/ACT inhaler Inhale 2 puffs into the lungs every 4 (four) hours as needed for wheezing or shortness of breath (cough, shortness of breath or wheezing.). (Patient not taking: Reported on 01/07/2017) 1 Inhaler 1  . Albuterol Sulfate (PROAIR RESPICLICK) 123XX123 (90 Base) MCG/ACT AEPB Inhale 2 puffs into the lungs every 4 (four) hours as needed. (Patient not taking: Reported on 01/07/2017) 1 each 0  .  hydrochlorothiazide (HYDRODIURIL) 25 MG tablet Take 1 tablet (25 mg total) by mouth daily. 30 tablet 0  . metoprolol succinate (TOPROL-XL) 50 MG 24 hr tablet Take 1 tablet (50 mg total) by mouth daily. Take with or immediately following a meal. 30 tablet 0   No current facility-administered medications for this visit.     Allergies:  Allergies  Allergen Reactions  . Other Other (See Comments)    BLOOD PRODUCT REFUSAL (patient is a Jehovah's Witness)    Past Surgical History:  Procedure Laterality Date  . ANTERIOR CERVICAL DECOMP/DISCECTOMY FUSION N/A 03/30/2013   Procedure: ANTERIOR CERVICAL DECOMPRESSION/DISCECTOMY FUSION 2 LEVELS;   Surgeon: Otilio Connors, MD;  Location: [place] [facility];  Service: Neurosurgery;  Laterality: N/A;  C4-5 C5-6 Anterior cervical decompression/diskectomy/fusion/Allograft/Plate  . CHOLECYSTECTOMY N/A 12/07/2013   Procedure: LAPAROSCOPIC CHOLECYSTECTOMY WITH INTRAOPERATIVE CHOLANGIOGRAM;  Surgeon: Adin Hector, MD;  Location: [facility];  Service: General;  Laterality: N/A;  . COLONOSCOPY    . DECOMPRESSIVE LUMBAR LAMINECTOMY LEVEL 2 N/A 02/15/2015   Procedure: COMPLETE DECOMPRESSIVE LUMBAR LAMINECTOMY L4-L5/ FORAMINOTOMY TO L4 NERVE ROOT AND L5 NERVE ROOT BILATERALLY;  Surgeon: [name], MD;  Location: [facility];  Service: Orthopedics;  Laterality: N/A;  . EYE SURGERY     right, growth excision  . LUMBAR LAMINECTOMY/DECOMPRESSION MICRODISCECTOMY Left 01/18/2016   Procedure:  DECOMPRESSION L4-L5 MICRODISCECTOMY L4-L5 ON LEFT FOR SPINAL STENOSIS;  Surgeon: [name], MD;  Location: [facility];  Service: Orthopedics;  Laterality: Left;  . SPINE SURGERY    . TONSILLECTOMY    . VASECTOMY    . WRIST GANGLION EXCISION Left     Social History   Social History  . Marital status: Married    Spouse name: N/A  . Number of children: 2  . Years of education: N/A   Occupational History  . bus driver Retired   Social History Main Topics  . Smoking status: Former Smoker    Types: Cigarettes    Quit date: 12/14/1972  . Smokeless tobacco: Never Used  . Alcohol use Yes     Comment: occasionally  . Drug use: No  . Sexual activity: Yes    Birth control/ protection: None   Other Topics Concern  . None   Social History Narrative  . None    Review of Systems  Constitutional: Negative for chills and fever.  HENT: Negative for congestion, ear pain, sinus pain and tinnitus.   Respiratory: Positive for cough. Negative for sputum production.   Cardiovascular: Negative for chest pain and palpitations.  Gastrointestinal: Negative for abdominal pain, nausea and vomiting.    Objective: Vitals:    01/07/17 1009  BP: (!) 142/70  Pulse: 100  Resp: 18  Temp: 98.3 F (36.8 C)  TempSrc: Oral  SpO2: 94%  Weight: 225 lb (102.1 kg)  Height: 5\' 4"  (1.626 m)    Physical Exam General: alert, oriented, in NAD Head: normocephalic, atraumatic, no sinus tenderness Eyes: EOM intact, no scleral icterus or conjunctival injection Ears: TM clear bilaterally Throat: no pharyngeal exudate or erythema Lymph: no posterior auricular, submental or cervical lymph adenopathy Heart: normal rate, normal sinus rhythm, no murmurs Lungs: clear to auscultation bilaterally, no wheezing   Assessment and Plan Bronner was seen today for cough and shortness of breath.  Diagnoses and all orders for this visit:  Cough- differential diagnosis at this point is ace inhibitor side effect or reflux unable to do spirometry today Reviewed all the previous cxrs and all the medications tried and  pt not getting better Concerning that this might be a lisinopril induced cough Discussed that he should stop lisinopril and return to clinic in 3 weeks for follow up on cough -     Spirometry with graph  Essential hypertension- stopped lisinopril due to cough  Reviewed chart going back as far as 2014 Plan to restart hctz and metoprolol which pt previously tolerated Gave precautions for beta blocker causing dizziness and fatigue  Also gave precautions for hctz causing muscle cramps, hypokalemia and dehydration (he has no history of gout) -     metoprolol succinate (TOPROL-XL) 50 MG 24 hr tablet; Take 1 tablet (50 mg total) by mouth daily. Take with or immediately following a meal. -     hydrochlorothiazide (HYDRODIURIL) 25 MG tablet; Take 1 tablet (25 mg total) by mouth daily.  Encounter for medication monitoring- will stop lisinopril due to concern that he now has an ace-inhibitor induced cough If his cough is not improved with stopping lisinopril will refer to Pulmonology   Other orders     [place]

## 2017-01-30 ENCOUNTER — Encounter: Payer: Self-pay | Admitting: Family Medicine

## 2017-01-30 ENCOUNTER — Ambulatory Visit (INDEPENDENT_AMBULATORY_CARE_PROVIDER_SITE_OTHER): Payer: Medicare HMO | Admitting: Family Medicine

## 2017-01-30 VITALS — BP 154/89 | HR 90 | Temp 98.4°F | Resp 16 | Ht 64.0 in | Wt 224.6 lb

## 2017-01-30 DIAGNOSIS — M25571 Pain in right ankle and joints of right foot: Secondary | ICD-10-CM | POA: Diagnosis not present

## 2017-01-30 DIAGNOSIS — R058 Other specified cough: Secondary | ICD-10-CM

## 2017-01-30 DIAGNOSIS — I10 Essential (primary) hypertension: Secondary | ICD-10-CM

## 2017-01-30 DIAGNOSIS — N5201 Erectile dysfunction due to arterial insufficiency: Secondary | ICD-10-CM | POA: Diagnosis not present

## 2017-01-30 DIAGNOSIS — M2141 Flat foot [pes planus] (acquired), right foot: Secondary | ICD-10-CM | POA: Diagnosis not present

## 2017-01-30 DIAGNOSIS — R05 Cough: Secondary | ICD-10-CM

## 2017-01-30 DIAGNOSIS — T464X5A Adverse effect of angiotensin-converting-enzyme inhibitors, initial encounter: Secondary | ICD-10-CM | POA: Diagnosis not present

## 2017-01-30 MED ORDER — SILDENAFIL CITRATE 100 MG PO TABS: 100.0000 mg | ORAL_TABLET | Freq: Every day | ORAL | 0 refills | Status: DC | PRN

## 2017-01-30 MED ORDER — AMLODIPINE BESYLATE 10 MG PO TABS: 10.0000 mg | ORAL_TABLET | Freq: Every day | ORAL | 3 refills | Status: DC

## 2017-01-30 NOTE — Progress Notes (Signed)
Chief Complaint  Patient presents with  . Follow-up    3 week    HPI   Cough and Hypertension Pt reports that since stopping lisinopril 3 weeks ago he has resolution of his coughing He states that 3 days after stopping his cough cleared up He reports that he feels much better and has stopped the allergy meds and inhalers. He has not been monitoring his bp at home He denies CP, SOB, LE [place], or SOB BP Readings from Last 3 Encounters:  01/30/17 (!) 154/89  01/07/17 (!) 142/70  01/01/17 126/64   Erectile Dysfunction: Patient complains of erectile dysfunction.  Onset of dysfunction was a few years ago and was gradual in onset.  Patient states the nature of difficulty is both attaining and maintaining erection. Full erections occur never. Partial erections occur with intercourse. Libido is affected. Risk factors for ED include antihypertensive medications, PREVIOUSLY ON LISINOPRIL. Patient denies history of cardiovascular disease, diabetes mellitus, cranial, spinal, or pelvic trauma, pelvic radiation, beta blocker use, tobacco use and hypogonadism. Patient's expectations as to sexual function would like to be able to maintain erections.  Patient's description of relationship w/partner healthy long standing marriage.  Previous treatment of ED includes none.    Foot Pain Pt reports that if he walks barefoot he gets pain He states that he has to wear shoes with supportive insoles  If he walks barefoot or stands in slippers he gets a feeling like his bones are pressing into the floor.   Past Medical History:  Diagnosis Date  . Anemia   . Arthritis    hands, knees, cervical area. Back pain. Rheumatoid arthritis- weekly injections.  [name] BPH (benign prostatic hypertrophy)   . Essential tremor   . GERD (gastroesophageal reflux disease)    reports for indigestion he uses mustard   . Hearing deficit    wears hearing aids bilateral  . HTN (hypertension)   . Obesity   . Refusal of blood  transfusions as patient is Jehovah's Witness   . Right bundle branch block    history of  . Seizures (HCC)    AS A CHILD. only esential tremors now.  . Sleep apnea    cpap - settings at 9 per patient   . Ulcer [facility])     Current Outpatient Prescriptions  Medication Sig Dispense Refill  . Adalimumab (HUMIRA PEN [place]) Inject into the skin.    [name] aspirin EC 81 MG tablet Take 81 mg by mouth daily.    . folic acid (FOLVITE) 1 MG tablet Take 1 mg by mouth daily.    . hydrochlorothiazide (HYDRODIURIL) 25 MG tablet Take 1 tablet (25 mg total) by mouth daily. 30 tablet 0  . methotrexate 250 MG/10ML injection Inject 0.6 mLs into the skin every 7 (seven) days.     . primidone (MYSOLINE) 50 MG tablet Take 50 mg by mouth 2 (two) times daily.     . Tamsulosin HCl (FLOMAX) 0.4 MG CAPS Take 0.4 mg by mouth at bedtime.     [name] amLODipine (NORVASC) 10 MG tablet Take 1 tablet (10 mg total) by mouth daily. 30 tablet 3   No current facility-administered medications for this visit.     Allergies:  Allergies  Allergen Reactions  . Lisinopril Cough  . Other Other (See Comments)    BLOOD PRODUCT REFUSAL (patient is a Jehovah's Witness)    Past Surgical History:  Procedure Laterality Date  . ANTERIOR CERVICAL DECOMP/DISCECTOMY FUSION N/A 03/30/2013   Procedure: ANTERIOR  CERVICAL DECOMPRESSION/DISCECTOMY FUSION 2 LEVELS;  Surgeon: Otilio Connors, MD;  Location: [facility];  Service: Neurosurgery;  Laterality: N/A;  C4-5 C5-6 Anterior cervical decompression/diskectomy/fusion/Allograft/Plate  . CHOLECYSTECTOMY N/A 12/07/2013   Procedure: LAPAROSCOPIC CHOLECYSTECTOMY WITH INTRAOPERATIVE CHOLANGIOGRAM;  Surgeon: Adin Hector, MD;  Location: [facility];  Service: General;  Laterality: N/A;  . COLONOSCOPY    . DECOMPRESSIVE LUMBAR LAMINECTOMY LEVEL 2 N/A 02/15/2015   Procedure: COMPLETE DECOMPRESSIVE LUMBAR LAMINECTOMY L4-L5/ FORAMINOTOMY TO L4 NERVE ROOT AND L5 NERVE ROOT BILATERALLY;  Surgeon: [name], MD;   Location: [facility];  Service: Orthopedics;  Laterality: N/A;  . EYE SURGERY     right, growth excision  . LUMBAR LAMINECTOMY/DECOMPRESSION MICRODISCECTOMY Left 01/18/2016   Procedure:  DECOMPRESSION L4-L5 MICRODISCECTOMY L4-L5 ON LEFT FOR SPINAL STENOSIS;  Surgeon: [name], MD;  Location[facility];  Service: Orthopedics;  Laterality: Left;  . SPINE SURGERY    . TONSILLECTOMY    . VASECTOMY    . WRIST GANGLION EXCISION Left     Social History   Social History  . Marital status: Married    Spouse name: N/A  . Number of children: 2  . Years of education: N/A   Occupational History  . bus driver Retired   Social History Main Topics  . Smoking status: Former Smoker    Types: Cigarettes    Quit date: 12/14/1972  . Smokeless tobacco: Never Used  . Alcohol use Yes     Comment: occasionally  . Drug use: No  . Sexual activity: Yes    Birth control/ protection: None   Other Topics Concern  . None   Social History Narrative  . None    ROS See hpi  Objective: Vitals:   01/30/17 1338  BP: (!) 154/89  Pulse: 90  Resp: 16  Temp: 98.4 F (36.9 C)  TempSrc: Oral  SpO2: 96%  Weight: 224 lb 9.6 oz (101.9 kg)  Height: 5\' 4"  (1.626 m)    Physical Exam  Constitutional: He is oriented to person, place, and time. He appears well-developed and well-nourished.  HENT:  Head: Normocephalic and atraumatic.  Eyes: Conjunctivae and EOM are normal.  Neck: Normal range of motion. Neck supple.  Cardiovascular: Normal rate, regular rhythm, normal heart sounds and intact distal pulses.   Pulmonary/Chest: Effort normal and breath sounds normal. No respiratory distress. He has no wheezes.  Musculoskeletal:  Tenderness to palpation of hte plantar arch bilaterally Pes planus noted bilaterally  Neurological: He is alert and oriented to person, place, and time.  Skin: Capillary refill takes less than 2 seconds.  Psychiatric: He has a normal mood and affect. His behavior is normal. Judgment  and thought content normal.     Assessment and Plan Loren was seen today for follow-up.  Diagnoses and all orders for this visit:  Essential hypertension- added amlodipine for improved bp control -     Comprehensive metabolic panel -     Lipid panel  Erectile dysfunction due to arterial insufficiency- he should get testing to see if there are underlying causes Discussed cialis and viagra -     TestT+TestF+SHBG -     TSH  ACE-inhibitor cough- resolved with stopping lisinopril Added amlodipine  Pes planus of right foot Pain in joint involving right ankle and foot Advised continued arch support with inserts at all times Follow up with Podiatry -     Ambulatory referral to Podiatry  Other orders -     amLODipine (NORVASC) 10 MG tablet;  Take 1 tablet (10 mg total) by mouth daily.     Cash

## 2017-01-30 NOTE — Patient Instructions (Addendum)
IF you received an x-ray today, you will receive an invoice from [facility]. Please contact [facility] at [phone] with questions or concerns regarding your invoice.   IF you received labwork today, you will receive an invoice from [facility]. Please contact [facility] at [phone] with questions or concerns regarding your invoice.   Our billing staff will not be able to assist you with questions regarding bills from these companies.  You will be contacted with the lab results as soon as they are available. The fastest way to get your results is to activate your My Chart account. Instructions are located on the last page of this paperwork. If you have not heard from [place] regarding the results in 2 weeks, please contact this office.      Testosterone Replacement Therapy Testosterone replacement therapy (TRT) is used to treat men who have a low testosterone level (hypogonadism). Testosterone is a male hormone that is produced in the testicles. It is responsible for typically male characteristics and for maintaining a man's sex drive and the ability to get an erection. Testosterone also supports bone and muscle health. TRT can be a gel, liquid, or patch that you put on your skin. It can also be in the form of a tablet or an injection. In some cases, your health care provider may insert long-acting pellets under your skin. In most men, the level of testosterone starts to decline gradually after age 65. Low testosterone can also be caused by certain medical conditions, medicines, and obesity. Your health care provider can diagnose hypogonadism with at least two blood tests that are done early in the morning. Low testosterone may not need to be treated. TRT is usually a choice that you make with your health care provider. Your health care provider may recommend TRT if you have low testosterone that is causing symptoms, such as:  Low sex drive.  Erection problems.  Breast  enlargement.  Loss of body hair.  Weak muscles or bones.  Shrinking testicles.  Increased body fat.  Low energy.  Hot flashes.  Depression.  Decreased work Systems analyst. TRT is a lifetime treatment. If you stop treatment, your testosterone will drop, and your symptoms may return. What are the risks? Testosterone replacement therapy may have side effects, including:  Lower sperm count.  Skin irritation at the application or injection site.  Mouth irritation if you take an oral tablet.  Acne.  Swelling of your legs or feet.  Tender breasts.  Dizziness.  Sleep disturbance.  Mood swings.  Possible increased risk of stroke or heart attack. Testosterone replacement therapy may also increase your risk for prostate cancer or male breast cancer. You should not use TRT if you have either of those conditions. Your health care provider also may not recommend TRT if:  You are suspected of having prostate cancer.  You want to father a child.  You have a high number of red blood cells.  You have untreated sleep apnea.  You have a very large prostate. Supplies needed:  Your health care provider will prescribe the testosterone gel, solution, or medicine that you need. If your health care provider teaches you to do self-injections at home, you will also need:  Your medicine vial.  Disposable needles and syringes.  Alcohol swabs.  A needle disposal container.  Adhesive bandages. How to use testosterone replacement therapy Your health care provider will help you find the TRT option that will work best for you based on your preference, the side  effects, and the cost. You may:  Rub testosterone gel on your upper arm or shoulder every day after a shower. This is the most common type of TRT. Do not let women or children come in contact with the gel.  Apply a testosterone solution under your arms once each day.  Place a testosterone patch on your skin once each  day.  Dissolve a testosterone tablet in your mouth twice each day.  Have a testosterone pellet inserted under your skin by your health care provider. This will be replaced every 3-6 months.  Use testosterone nasal spray three times each day.  Get testosterone injections. For some types of testosterone, your health care provider will give you this injection. With other types of testosterone, you may be taught to give injections to yourself. The frequency of injections may vary based on the type of testosterone that you receive. Follow these instructions at home:  Take over-the-counter and prescription medicines only as told by your health care provider.  Lose weight if you are overweight. Ask your health care provider to help you start a healthy diet and exercise program to reach and maintain a healthy weight.  Work with your health care provider to treat other medical conditions that may lower your testosterone. These include obesity, high blood pressure, high cholesterol, diabetes, liver disease, kidney disease, and sleep apnea.  Keep all follow-up visits as told by your health care provider. This is important. General recommendations  Discuss all risks and benefits with your health care provider before starting therapy.  Work with your health care provider to check your prostate health and do blood testing before you start therapy.  Do not use any testosterone replacement therapies that are not prescribed by your health care provider or not approved for use in the [place]  Do not use TRT for bodybuilding or to improve sexual performance. TRT should be used only to treat symptoms of low testosterone.  Return for all repeat prostate checks and blood tests during therapy, as told by your health care provider. Where to find more information: Learn more about testosterone replacement therapy from:  [place]:  www.urologyhealth.org/urologic-conditions/low-testosterone-(hypogonadism)  Endocrine Society: www.hormone.org/diseases-and-conditions/mens-health/hypogonadism Contact a health care provider if:  You have side effects from your testosterone replacement therapy.  You continue to have symptoms of low testosterone during treatment.  You develop new symptoms during treatment. Summary  Testosterone replacement therapy is only for men who have low testosterone as determined by blood testing and who have symptoms of low testosterone.  Testosterone replacement therapy should be prescribed only by a health care provider and should be used under the supervision of a health care provider.  You may not be able to take testosterone if you have certain medical conditions, including prostate cancer, male breast cancer, or heart disease.  Testosterone replacement therapy may have side effects and may make some medical conditions worse.  Talk with your health care provider about all the risks and benefits before you start therapy. This information is not intended to replace advice given to you by your health care provider. Make sure you discuss any questions you have with your health care provider. Document Released: 07/19/2016 Document Revised: 07/19/2016 Document Reviewed: 07/19/2016 Elsevier Interactive Patient Education  2017 [employer].

## 2017-02-01 ENCOUNTER — Other Ambulatory Visit: Payer: Medicare HMO

## 2017-02-01 DIAGNOSIS — N5201 Erectile dysfunction due to arterial insufficiency: Secondary | ICD-10-CM

## 2017-02-01 DIAGNOSIS — I10 Essential (primary) hypertension: Secondary | ICD-10-CM

## 2017-02-03 ENCOUNTER — Other Ambulatory Visit: Payer: Self-pay | Admitting: Family Medicine

## 2017-02-04 LAB — LIPID PANEL
Chol/HDL Ratio: 2.4 ratio units (ref 0.0–5.0)
Cholesterol, Total: 168 mg/dL (ref 100–199)
HDL: 69 mg/dL (ref >39)
LDL Calculated: 86 mg/dL (ref 0–99)
TRIGLYCERIDES: 65 mg/dL (ref 0–149)
VLDL Cholesterol Cal: 13 mg/dL (ref 5–40)

## 2017-02-04 LAB — TESTT+TESTF+SHBG
SEX HORMONE BINDING: 51.5 nmol/L (ref 19.3–76.4)
TESTOSTERONE FREE: 14.8 pg/mL (ref 6.6–18.1)
Testosterone, total: 361.1 ng/dL (ref 264.0–916.0)

## 2017-02-04 LAB — TSH: TSH: 1.66 u[IU]/mL (ref 0.450–4.500)

## 2017-02-04 NOTE — Progress Notes (Signed)
Please create a letter notifying the patient "your testosterone levels are normal and so are your thyroid and cholesterol levels".

## 2017-02-05 ENCOUNTER — Encounter: Payer: Self-pay | Admitting: Radiology

## 2017-02-18 ENCOUNTER — Encounter: Payer: Self-pay | Admitting: Podiatry

## 2017-02-18 ENCOUNTER — Ambulatory Visit (INDEPENDENT_AMBULATORY_CARE_PROVIDER_SITE_OTHER): Payer: Medicare HMO | Admitting: Podiatry

## 2017-02-18 ENCOUNTER — Ambulatory Visit (INDEPENDENT_AMBULATORY_CARE_PROVIDER_SITE_OTHER): Payer: Medicare HMO

## 2017-02-18 DIAGNOSIS — M21611 Bunion of right foot: Secondary | ICD-10-CM | POA: Diagnosis not present

## 2017-02-18 DIAGNOSIS — M76821 Posterior tibial tendinitis, right leg: Secondary | ICD-10-CM | POA: Diagnosis not present

## 2017-02-18 DIAGNOSIS — M2011 Hallux valgus (acquired), right foot: Secondary | ICD-10-CM | POA: Diagnosis not present

## 2017-02-18 DIAGNOSIS — M79671 Pain in right foot: Secondary | ICD-10-CM

## 2017-02-18 DIAGNOSIS — M7751 Other enthesopathy of right foot: Secondary | ICD-10-CM | POA: Diagnosis not present

## 2017-02-19 NOTE — Progress Notes (Signed)
   Subjective: Patient is a 71 year old male presenting as a new patient complaining of aching pain to the medial side of the right foot beneath the ankle onset approximately one month ago. He states the pain is severe in the morning upon waking. Wearing shoes helps to alleviate his pain.     Objective/Physical Exam General: The patient is alert and oriented x3 in no acute distress.  Dermatology: Skin is warm, dry and supple bilateral lower extremities. Negative for open lesions or macerations.  Vascular: Palpable pedal pulses bilaterally. No edema or erythema noted. Capillary refill within normal limits.  Neurological: Epicritic and protective threshold grossly intact bilaterally.   Musculoskeletal Exam: Pain with palpation to the right posterior tibial tendon. Range of motion within normal limits to all pedal and ankle joints bilateral. Muscle strength 5/5 in all groups bilateral.   Radiographic Exam:  Normal osseous mineralization. Joint spaces preserved. No fracture/dislocation/boney destruction.    Assessment: #1 HAV-right #2 insertional PT tendinitis   Plan of Care:  #1 Patient was evaluated. #2 Injection of 0.5 mLs of [place] Soluspan injected into the right PT tendon. Care was taken to avoid direct injection into the tendon. #3 Antiinflammatory pain cream dispensed from [facility] #4 Return to clinic in 4 weeks   Edrick Kins, DPM [facility]  Dr. Edrick Kins, [address]                                        [place], [place] [address]                Office [phone]  Fax [phone]

## 2017-02-20 DIAGNOSIS — Z0000 Encounter for general adult medical examination without abnormal findings: Secondary | ICD-10-CM | POA: Diagnosis not present

## 2017-02-20 DIAGNOSIS — S96919D Strain of unspecified muscle and tendon at ankle and foot level, unspecified foot, subsequent encounter: Secondary | ICD-10-CM | POA: Diagnosis not present

## 2017-02-20 DIAGNOSIS — W19XXXS Unspecified fall, sequela: Secondary | ICD-10-CM | POA: Diagnosis not present

## 2017-02-20 DIAGNOSIS — G250 Essential tremor: Secondary | ICD-10-CM | POA: Diagnosis not present

## 2017-02-20 DIAGNOSIS — I10 Essential (primary) hypertension: Secondary | ICD-10-CM | POA: Diagnosis not present

## 2017-02-20 DIAGNOSIS — H9113 Presbycusis, bilateral: Secondary | ICD-10-CM | POA: Diagnosis not present

## 2017-02-20 DIAGNOSIS — N3281 Overactive bladder: Secondary | ICD-10-CM | POA: Diagnosis not present

## 2017-02-20 DIAGNOSIS — M545 Low back pain: Secondary | ICD-10-CM | POA: Diagnosis not present

## 2017-02-20 DIAGNOSIS — M059 Rheumatoid arthritis with rheumatoid factor, unspecified: Secondary | ICD-10-CM | POA: Diagnosis not present

## 2017-02-20 DIAGNOSIS — Z79899 Other long term (current) drug therapy: Secondary | ICD-10-CM | POA: Diagnosis not present

## 2017-02-20 MED ORDER — BETAMETHASONE SOD PHOS & ACET 6 (3-3) MG/ML IJ SUSP: 3.0000 mg | Freq: Once | INTRAMUSCULAR | Status: DC

## 2017-02-22 DIAGNOSIS — R69 Illness, unspecified: Secondary | ICD-10-CM | POA: Diagnosis not present

## 2017-03-06 ENCOUNTER — Encounter: Payer: Self-pay | Admitting: Family Medicine

## 2017-03-06 ENCOUNTER — Ambulatory Visit (INDEPENDENT_AMBULATORY_CARE_PROVIDER_SITE_OTHER): Payer: Medicare HMO | Admitting: Family Medicine

## 2017-03-06 VITALS — BP 150/79 | HR 76 | Temp 97.9°F | Resp 18 | Ht 64.0 in | Wt 229.4 lb

## 2017-03-06 DIAGNOSIS — I10 Essential (primary) hypertension: Secondary | ICD-10-CM

## 2017-03-06 DIAGNOSIS — E669 Obesity, unspecified: Secondary | ICD-10-CM | POA: Diagnosis not present

## 2017-03-06 DIAGNOSIS — R05 Cough: Secondary | ICD-10-CM

## 2017-03-06 DIAGNOSIS — Z6839 Body mass index (BMI) 39.0-39.9, adult: Secondary | ICD-10-CM

## 2017-03-06 DIAGNOSIS — R058 Other specified cough: Secondary | ICD-10-CM

## 2017-03-06 DIAGNOSIS — T464X5A Adverse effect of angiotensin-converting-enzyme inhibitors, initial encounter: Secondary | ICD-10-CM | POA: Diagnosis not present

## 2017-03-06 NOTE — Progress Notes (Signed)
Chief Complaint  Patient presents with  . Follow-up  . Hypertension    did not take bp meds on yesterday but other days he has been taking regularly   . Cough    states his cough is gone    HPI  Hypertension: Patient here for follow-up of elevated blood pressure.  BP Readings from Last 3 Encounters:  03/06/17 (!) 150/79  01/30/17 (!) 154/89  01/07/17 (!) 142/70   Hypertension and Ace Inhibitor cough Pt reports that his ace inhibitor cough is still gone.  He states that he has been breathing better. He is not affected by the allergies.  No further wheezing and no shortness of breath. He states that his blood pressure is remaining high.  He is amazed at how strong the lisinopril was. He admits that he could to better about what he eats. He did not take his blood pressure yesterday. Cardiac symptoms none. Patient denies chest pain, dyspnea, fatigue, lower extremity edema and palpitations.  He does not exercise  Obesity Pt reports that his appetite is very good.  He has not been exercising. He is a member of silver sneakers.  Wt Readings from Last 3 Encounters:  03/06/17 229 lb 6.4 oz (104.1 kg)  01/30/17 224 lb 9.6 oz (101.9 kg)  01/07/17 225 lb (102.1 kg)   He also plans to decrease his snacking and drink more water.    Past Medical History:  Diagnosis Date  . Anemia   . Arthritis    hands, knees, cervical area. Back pain. Rheumatoid arthritis- weekly injections.  [name] BPH (benign prostatic hypertrophy)   . Essential tremor   . GERD (gastroesophageal reflux disease)    reports for indigestion he uses mustard   . Hearing deficit    wears hearing aids bilateral  . HTN (hypertension)   . Obesity   . Refusal of blood transfusions as patient is Jehovah's Witness   . Right bundle branch block    history of  . Seizures (HCC)    AS A CHILD. only esential tremors now.  . Sleep apnea    cpap - settings at 9 per patient   . Ulcer     Current Outpatient Prescriptions    Medication Sig Dispense Refill  . amLODipine (NORVASC) 10 MG tablet Take 1 tablet (10 mg total) by mouth daily. 30 tablet 3  . aspirin EC 81 MG tablet Take 81 mg by mouth daily.    . folic acid (FOLVITE) 1 MG tablet Take 1 mg by mouth daily.    . hydrochlorothiazide (HYDRODIURIL) 25 MG tablet TAKE 1 TABLET BY MOUTH ONCE DAILY 90 tablet 0  . methotrexate 250 MG/10ML injection Inject 0.6 mLs into the skin every 7 (seven) days.     . primidone (MYSOLINE) 50 MG tablet Take 50 mg by mouth 2 (two) times daily.     . sildenafil (VIAGRA) 100 MG tablet Take 1 tablet (100 mg total) by mouth daily as needed for erectile dysfunction. 5 tablet 0  . Tamsulosin HCl (FLOMAX) 0.4 MG CAPS Take 0.4 mg by mouth at bedtime.     . Adalimumab (HUMIRA PEN [place]) Inject into the skin.    [name] B-D TB SYRINGE 1CC/27GX1/2" 27G X 1/2" 1 ML MISC      Current Facility-Administered Medications  Medication Dose Route Frequency Provider Last Rate Last Dose  . betamethasone acetate-betamethasone sodium phosphate (CELESTONE) injection 3 mg  3 mg Intramuscular Once Edrick Kins, DPM  Allergies:  Allergies  Allergen Reactions  . Lisinopril Cough  . Other Other (See Comments)    BLOOD PRODUCT REFUSAL (patient is a Jehovah's Witness)    Past Surgical History:  Procedure Laterality Date  . ANTERIOR CERVICAL DECOMP/DISCECTOMY FUSION N/A 03/30/2013   Procedure: ANTERIOR CERVICAL DECOMPRESSION/DISCECTOMY FUSION 2 LEVELS;  Surgeon: Otilio Connors, MD;  Location: [facility];  Service: Neurosurgery;  Laterality: N/A;  C4-5 C5-6 Anterior cervical decompression/diskectomy/fusion/Allograft/Plate  . CHOLECYSTECTOMY N/A 12/07/2013   Procedure: LAPAROSCOPIC CHOLECYSTECTOMY WITH INTRAOPERATIVE CHOLANGIOGRAM;  Surgeon: Adin Hector, MD;  Location: [facility];  Service: General;  Laterality: N/A;  . COLONOSCOPY    . DECOMPRESSIVE LUMBAR LAMINECTOMY LEVEL 2 N/A 02/15/2015   Procedure: COMPLETE DECOMPRESSIVE LUMBAR LAMINECTOMY L4-L5/  FORAMINOTOMY TO L4 NERVE ROOT AND L5 NERVE ROOT BILATERALLY;  Surgeon: [name], MD;  Location: [facility];  Service: Orthopedics;  Laterality: N/A;  . EYE SURGERY     right, growth excision  . LUMBAR LAMINECTOMY/DECOMPRESSION MICRODISCECTOMY Left 01/18/2016   Procedure:  DECOMPRESSION L4-L5 MICRODISCECTOMY L4-L5 ON LEFT FOR SPINAL STENOSIS;  Surgeon: [name], MD;  [facility];  Service: Orthopedics;  Laterality: Left;  . SPINE SURGERY    . TONSILLECTOMY    . VASECTOMY    . WRIST GANGLION EXCISION Left     Social History   Social History  . Marital status: Married    Spouse name: N/A  . Number of children: 2  . Years of education: N/A   Occupational History  . bus driver Retired   Social History Main Topics  . Smoking status: Former Smoker    Types: Cigarettes    Quit date: 12/14/1972  . Smokeless tobacco: Never Used  . Alcohol use Yes     Comment: occasionally  . Drug use: No  . Sexual activity: Yes    Birth control/ protection: None   Other Topics Concern  . None   Social History Narrative  . None    ROS See hpi  Objective: Vitals:   03/06/17 1444  BP: (!) 150/79  Pulse: 76  Resp: 18  Temp: 97.9 F (36.6 C)  TempSrc: Oral  SpO2: 94%  Weight: 229 lb 6.4 oz (104.1 kg)  Height: 5\' 4"  (1.626 m)  Body mass index is 39.38 kg/m.   Wt Readings from Last 3 Encounters:  03/06/17 229 lb 6.4 oz (104.1 kg)  01/30/17 224 lb 9.6 oz (101.9 kg)  01/07/17 225 lb (102.1 kg)    Physical Exam  Constitutional: He appears well-developed and well-nourished.  HENT:  Head: Normocephalic and atraumatic.  Eyes: Conjunctivae and EOM are normal.  Cardiovascular: Normal rate, regular rhythm and normal heart sounds.   Pulmonary/Chest: Effort normal and breath sounds normal. No respiratory distress. He has no wheezes.  Musculoskeletal: Normal range of motion. He exhibits no edema.  Skin: Skin is warm. Capillary refill takes less than 2 seconds.  Psychiatric: He  has a normal mood and affect. His behavior is normal. Judgment and thought content normal.     Assessment and Plan Bryan [name] was seen today for follow-up, hypertension and cough.  Diagnoses and all orders for this visit:  Essential hypertension  Class 2 severe obesity due to excess calories with serious comorbidity and body mass index (BMI) of 39.0 to 39.9 in adult [facility])  Obesity (BMI 30-39.9)  ACE-inhibitor cough    Problem List Items Addressed This Visit      Cardiovascular and Mediastinum   Essential hypertension - Primary  Discussed that since he is on dual therapy he should add in exercise and try weight loss. Will play on a weight loss strategy. He is in [employer].        Other   Obesity (BMI 30-39.9)    Discussed 7% of weight loss goal for the next year  Goal of 1-2 pounds consistent loss a month (keeping that weight off each month)      Class 2 severe obesity due to excess calories with serious comorbidity and body mass index (BMI) of 39.0 to 39.9 in adult ([place])    Goal of 7% weight loss with a combination of diet and exercise. Pt already has silver sneakers program enrollment. Reviewed how to eat out and restaurants and still make healthy choices.      ACE-inhibitor cough    Resolved with cessation of ace inhibitor. Currently titrating bp meds         Thersea Manfredonia A Nolon Rod

## 2017-03-06 NOTE — Patient Instructions (Signed)
     IF you received an x-ray today, you will receive an invoice from [facility]. Please contact [facility] at [phone] with questions or concerns regarding your invoice.   IF you received labwork today, you will receive an invoice from [facility]. Please contact [facility] at [phone] with questions or concerns regarding your invoice.   Our billing staff will not be able to assist you with questions regarding bills from these companies.  You will be contacted with the lab results as soon as they are available. The fastest way to get your results is to activate your My Chart account. Instructions are located on the last page of this paperwork. If you have not heard from us regarding the results in 2 weeks, please contact this office.     

## 2017-03-06 NOTE — Assessment & Plan Note (Signed)
Discussed 7% of weight loss goal for the next year  Goal of 1-2 pounds consistent loss a month (keeping that weight off each month)

## 2017-03-06 NOTE — Assessment & Plan Note (Addendum)
Discussed that since he is on dual therapy he should add in exercise and try weight loss. Will play on a weight loss strategy. He is in [employer].

## 2017-03-07 DIAGNOSIS — R05 Cough: Secondary | ICD-10-CM | POA: Insufficient documentation

## 2017-03-07 DIAGNOSIS — T464X5A Adverse effect of angiotensin-converting-enzyme inhibitors, initial encounter: Secondary | ICD-10-CM

## 2017-03-07 DIAGNOSIS — R058 Other specified cough: Secondary | ICD-10-CM | POA: Insufficient documentation

## 2017-03-07 DIAGNOSIS — Z6839 Body mass index (BMI) 39.0-39.9, adult: Secondary | ICD-10-CM

## 2017-03-07 NOTE — Assessment & Plan Note (Signed)
Resolved with cessation of ace inhibitor. Currently titrating bp meds

## 2017-03-07 NOTE — Assessment & Plan Note (Signed)
Goal of 7% weight loss with a combination of diet and exercise. Pt already has silver sneakers program enrollment. Reviewed how to eat out and restaurants and still make healthy choices.

## 2017-03-18 ENCOUNTER — Ambulatory Visit (INDEPENDENT_AMBULATORY_CARE_PROVIDER_SITE_OTHER): Payer: Medicare HMO | Admitting: Podiatry

## 2017-03-18 ENCOUNTER — Encounter: Payer: Self-pay | Admitting: Podiatry

## 2017-03-18 DIAGNOSIS — M76821 Posterior tibial tendinitis, right leg: Secondary | ICD-10-CM | POA: Diagnosis not present

## 2017-03-18 MED ORDER — NONFORMULARY OR COMPOUNDED ITEM: 3 refills | Status: DC

## 2017-03-20 NOTE — Progress Notes (Signed)
   Subjective: Patient is a 71 year old male presenting for follow up evaluation of right PT tendinitis. He states the pain has partially resolved. He reports some relief with the injection. He states he did not receive the antiinflammatory pain cream from [employer].     Objective/Physical Exam General: The patient is alert and oriented x3 in no acute distress.  Dermatology: Skin is warm, dry and supple bilateral lower extremities. Negative for open lesions or macerations.  Vascular: Palpable pedal pulses bilaterally. No edema or erythema noted. Capillary refill within normal limits.  Neurological: Epicritic and protective threshold grossly intact bilaterally.   Musculoskeletal Exam: Pain with palpation to the right posterior tibial tendon. Range of motion within normal limits to all pedal and ankle joints bilateral. Muscle strength 5/5 in all groups bilateral.     Assessment: #1 insertional PT tendinitis right-improving   Plan of Care:  #1 Patient was evaluated. #2 Injection of 0.5 mLs of Celestone Soluspan injected into the right PT tendon. Care was taken to avoid direct injection into the tendon. #3 Reordered antiinflammatory pain cream from [facility] #4 Return to clinic in 4 weeks   Edrick Kins, DPM [facility]  Dr. Edrick Kins, [address]                                        [place], [place] [address]                Office [phone]  Fax [phone]

## 2017-03-26 DIAGNOSIS — I7300 Raynaud's syndrome without gangrene: Secondary | ICD-10-CM | POA: Diagnosis not present

## 2017-03-26 DIAGNOSIS — E669 Obesity, unspecified: Secondary | ICD-10-CM | POA: Diagnosis not present

## 2017-03-26 DIAGNOSIS — M5416 Radiculopathy, lumbar region: Secondary | ICD-10-CM | POA: Diagnosis not present

## 2017-03-26 DIAGNOSIS — Z6838 Body mass index (BMI) 38.0-38.9, adult: Secondary | ICD-10-CM | POA: Diagnosis not present

## 2017-03-26 DIAGNOSIS — M0579 Rheumatoid arthritis with rheumatoid factor of multiple sites without organ or systems involvement: Secondary | ICD-10-CM | POA: Diagnosis not present

## 2017-03-26 DIAGNOSIS — M2550 Pain in unspecified joint: Secondary | ICD-10-CM | POA: Diagnosis not present

## 2017-03-26 MED ORDER — BETAMETHASONE SOD PHOS & ACET 6 (3-3) MG/ML IJ SUSP: 3.0000 mg | Freq: Once | INTRAMUSCULAR | Status: DC

## 2017-04-15 ENCOUNTER — Encounter: Payer: Self-pay | Admitting: Podiatry

## 2017-04-15 ENCOUNTER — Ambulatory Visit (INDEPENDENT_AMBULATORY_CARE_PROVIDER_SITE_OTHER): Payer: Medicare HMO | Admitting: Podiatry

## 2017-04-15 DIAGNOSIS — M76821 Posterior tibial tendinitis, right leg: Secondary | ICD-10-CM

## 2017-04-15 NOTE — Progress Notes (Signed)
   Subjective: Patient is a 71 year old male presenting for follow up evaluation of right PT tendinitis. He states the pain is improving. He reports his insurance does not cover the pain cream from [employer].   Objective/Physical Exam General: The patient is alert and oriented x3 in no acute distress.  Dermatology: Skin is warm, dry and supple bilateral lower extremities. Negative for open lesions or macerations.  Vascular: Palpable pedal pulses bilaterally. No edema or erythema noted. Capillary refill within normal limits.  Neurological: Epicritic and protective threshold grossly intact bilaterally.   Musculoskeletal Exam: Range of motion within normal limits to all pedal and ankle joints bilateral. Muscle strength 5/5 in all groups bilateral.     Assessment: #1 insertional PT tendinitis right-resolved   Plan of Care:  #1 Patient was evaluated. #2 recommended good shoe gear. #3 return to clinic when necessary.   Edrick Kins, DPM [facility]  Dr. Edrick Kins, [address]                                        [place], [place] [address]                Office [phone]  Fax [phone]

## 2017-04-16 DIAGNOSIS — G8929 Other chronic pain: Secondary | ICD-10-CM | POA: Insufficient documentation

## 2017-04-16 DIAGNOSIS — M545 Low back pain: Secondary | ICD-10-CM | POA: Diagnosis not present

## 2017-05-03 ENCOUNTER — Encounter: Payer: Self-pay | Admitting: Family Medicine

## 2017-05-03 ENCOUNTER — Ambulatory Visit (INDEPENDENT_AMBULATORY_CARE_PROVIDER_SITE_OTHER): Payer: Medicare HMO | Admitting: Family Medicine

## 2017-05-03 VITALS — BP 138/70 | HR 78 | Temp 98.4°F | Resp 16 | Ht 64.0 in | Wt 231.2 lb

## 2017-05-03 DIAGNOSIS — M48062 Spinal stenosis, lumbar region with neurogenic claudication: Secondary | ICD-10-CM

## 2017-05-03 NOTE — Patient Instructions (Addendum)
Bryan [name] Phone: [phone] Orthopedic Surgery   IF you received an x-ray today, you will receive an invoice from [facility]. Please contact [facility] at [phone] with questions or concerns regarding your invoice.   IF you received labwork today, you will receive an invoice from [facility]. Please contact [facility] at [phone] with questions or concerns regarding your invoice.   Our billing staff will not be able to assist you with questions regarding bills from these companies.  You will be contacted with the lab results as soon as they are available. The fastest way to get your results is to activate your My Chart account. Instructions are located on the last page of this paperwork. If you have not heard from [place] regarding the results in 2 weeks, please contact this office.     Spinal Stenosis Spinal stenosis occurs when the open space (spinal canal) between the bones of your spine (vertebrae) narrows, putting pressure on the spinal cord or nerves. What are the causes? This condition is caused by areas of bone pushing into the central canals of your vertebrae. This condition may be present at birth (congenital), or it may be caused by:  Arthritic deterioration of your vertebrae (spinal degeneration). This usually starts around age 50.  Injury or trauma to the spine.  Tumors in the spine.  Calcium deposits in the spine.  What are the signs or symptoms? Symptoms of this condition include:  Pain in the neck or back that is generally worse with activities, particularly when standing and walking.  Numbness, tingling, hot or cold sensations, weakness, or weariness in your legs.  Pain going up and down the leg (sciatica).  Frequent episodes of falling.  A foot-slapping gait that leads to muscle weakness.  In more serious cases, you may develop:  Problemspassing stool or passing urine.  Difficulty having sex.  Loss of feeling in part or all of  your leg.  Symptoms may come on slowly and get worse over time. How is this diagnosed? This condition is diagnosed based on your medical history and a physical exam. Tests will also be done, such as:  MRI.  CT scan.  X-ray.  How is this treated? Treatment for this condition often focuses on managing your pain and any other symptoms. Treatment may include:  Practicing good posture to lessen pressure on your nerves.  Exercising to strengthen muscles, build endurance, improve balance, and maintain good joint movement (range of motion).  Losing weight, if needed.  Taking medicines to reduce swelling, inflammation, or pain.  Assistive devices, such as a corset or brace.  In some cases, surgery may be needed. The most common procedure is decompression laminectomy. This is done to remove excess bone that puts pressure on your nerve roots. Follow these instructions at home: Managing pain, stiffness, and swelling  Do all exercises and stretches as told by your health care provider.  Practice good posture. If you were given a brace or a corset, wear it as told by your health care provider.  Do not do any activities that cause pain. Ask your health care provider what activities are safe for you.  Do not lift anything that is heavier than 10 lb (4.5 kg) or the limit that your health care provider tells you.  Maintain a healthy weight. Talk with your health care provider if you need help losing weight.  If directed, apply heat to the affected area as often as told by your health care provider. Use the heat source that  your health care provider recommends, such as a moist heat pack or a heating pad. ? Place a towel between your skin and the heat source. ? Leave the heat on for 20-30 minutes. ? Remove the heat if your skin turns bright red. This is especially important if you are not able to feel pain, heat, or cold. You may have a greater risk of getting burned. General  instructions  Take over-the-counter and prescription medicines only as told by your health care provider.  Do not use any products that contain nicotine or tobacco, such as cigarettes and e-cigarettes. If you need help quitting, ask your health care provider.  Eat a healthy diet. This includes plenty of fruits and vegetables, whole grains, and low-fat (lean) protein.  Keep all follow-up visits as told by your health care provider. This is important. Contact a health care provider if:  Your symptoms do not get better or they get worse.  You have a fever. Get help right away if:  You have new or worse pain in your neck or upper back.  You have severe pain that cannot be controlled with medicines.  You are dizzy.  You have vision problems, blurred vision, or double vision.  You have a severe headache that is worse when you stand.  You have nausea or you vomit.  You develop new or worse numbness or tingling in your back or legs.  You have pain, redness, swelling, or warmth in your arm or leg. Summary  Spinal stenosis occurs when the open space (spinal canal) between the bones of your spine (vertebrae) narrows. This narrowing puts pressure on the spinal cord or nerves.  Spinal stenosis can cause numbness, weakness, or pain in the neck, back, and legs.  This condition may be caused by a birth defect, arthritic deterioration of your vertebrae, injury, tumors, or calcium deposits.  This condition is usually diagnosed with MRIs, CT scans, and X-rays. This information is not intended to replace advice given to you by your health care provider. Make sure you discuss any questions you have with your health care provider. Document Released: 01/19/2004 Document Revised: 10/03/2016 Document Reviewed: 10/03/2016 Elsevier Interactive Patient Education  2017 [employer].

## 2017-05-03 NOTE — Progress Notes (Signed)
Chief Complaint  Patient presents with  . Back Pain    pain since operation x 1 yr, worsening, pt can't lift or walk a long way.    HPI  Patient reports that he had a back operation and has chronic back pain  He states t hat he jumped [place] the trail[place] about a month ago and had some pain when he fell on his side In the mornings when he gets up he gets pain Walking, lifting, and standing aggravates the pain He reports numbness to the right half of the penis He has low back pain that goes down the legs which is chronic  He is passing his urine normally  His Orthopedic Surgeon Dr. Gladstone Lighter He states that he wanted to get an MRI but did not know where to start On 01/18/2016 he had a decompression L4-L5 microdiscectomy on the left   Past Medical History:  Diagnosis Date  . Anemia   . Arthritis    hands, knees, cervical area. Back pain. Rheumatoid arthritis- weekly injections.  [name] BPH (benign prostatic hypertrophy)   . Essential tremor   . GERD (gastroesophageal reflux disease)    reports for indigestion he uses mustard   . Hearing deficit    wears hearing aids bilateral  . HTN (hypertension)   . Obesity   . Refusal of blood transfusions as patient is Jehovah's Witness   . Right bundle branch block    history of  . Seizures (HCC)    AS A CHILD. only esential tremors now.  . Sleep apnea    cpap - settings at 9 per patient   . Ulcer     Current Outpatient Prescriptions  Medication Sig Dispense Refill  . Adalimumab (HUMIRA PEN [place]) Inject into the skin.    [name] amLODipine (NORVASC) 10 MG tablet Take 1 tablet (10 mg total) by mouth daily. 30 tablet 3  . aspirin EC 81 MG tablet Take 81 mg by mouth daily.    . B-D TB SYRINGE 1CC/27GX1/2" 27G X 1/2" 1 ML MISC     . folic acid (FOLVITE) 1 MG tablet Take 1 mg by mouth daily.    . hydrochlorothiazide (HYDRODIURIL) 25 MG tablet TAKE 1 TABLET BY MOUTH ONCE DAILY 90 tablet 0  . methotrexate 250 MG/10ML injection Inject 0.6 mLs into the skin  every 7 (seven) days.     . Tamsulosin HCl (FLOMAX) 0.4 MG CAPS Take 0.4 mg by mouth at bedtime.     . primidone (MYSOLINE) 50 MG tablet Take 50 mg by mouth 2 (two) times daily.      Current Facility-Administered Medications  Medication Dose Route Frequency Provider Last Rate Last Dose  . betamethasone acetate-betamethasone sodium phosphate (CELESTONE) injection 3 mg  3 mg Intramuscular Once Daylene Katayama M, DPM      . betamethasone acetate-betamethasone sodium phosphate (CELESTONE) injection 3 mg  3 mg Intramuscular Once Edrick Kins, DPM        Allergies:  Allergies  Allergen Reactions  . Lisinopril Cough  . Other Other (See Comments)    BLOOD PRODUCT REFUSAL (patient is a Jehovah's Witness)    Past Surgical History:  Procedure Laterality Date  . ANTERIOR CERVICAL DECOMP/DISCECTOMY FUSION N/A 03/30/2013   Procedure: ANTERIOR CERVICAL DECOMPRESSION/DISCECTOMY FUSION 2 LEVELS;  Surgeon: Otilio Connors, MD;  Location: [facility];  Service: Neurosurgery;  Laterality: N/A;  C4-5 C5-6 Anterior cervical decompression/diskectomy/fusion/Allograft/Plate  . CHOLECYSTECTOMY N/A 12/07/2013   Procedure: LAPAROSCOPIC CHOLECYSTECTOMY WITH INTRAOPERATIVE CHOLANGIOGRAM;  Surgeon:  Adin Hector, MD;  Location: [facility];  Service: General;  Laterality: N/A;  . COLONOSCOPY    . DECOMPRESSIVE LUMBAR LAMINECTOMY LEVEL 2 N/A 02/15/2015   Procedure: COMPLETE DECOMPRESSIVE LUMBAR LAMINECTOMY L4-L5/ FORAMINOTOMY TO L4 NERVE ROOT AND L5 NERVE ROOT BILATERALLY;  Surgeon: [name], MD;  Location: [facility];  Service: Orthopedics;  Laterality: N/A;  . EYE SURGERY     right, growth excision  . LUMBAR LAMINECTOMY/DECOMPRESSION MICRODISCECTOMY Left 01/18/2016   Procedure:  DECOMPRESSION L4-L5 MICRODISCECTOMY L4-L5 ON LEFT FOR SPINAL STENOSIS;  Surgeon: [name], MD;  Location: [facility];  Service: Orthopedics;  Laterality: Left;  . SPINE SURGERY    . TONSILLECTOMY    . VASECTOMY    . WRIST GANGLION EXCISION  Left     Social History   Social History  . Marital status: Married    Spouse name: N/A  . Number of children: 2  . Years of education: N/A   Occupational History  . bus driver Retired   Social History Main Topics  . Smoking status: Former Smoker    Types: Cigarettes    Quit date: 12/14/1972  . Smokeless tobacco: Never Used  . Alcohol use Yes     Comment: occasionally  . Drug use: No  . Sexual activity: Yes    Birth control/ protection: None   Other Topics Concern  . None   Social History Narrative  . None    Review of Systems  Constitutional: Negative for chills and fever.  Gastrointestinal: Negative for nausea and vomiting.  Genitourinary: Negative for dysuria and urgency.  Musculoskeletal: Positive for back pain, falls and joint pain.  Neurological: Negative for dizziness and headaches.  see hpi  Objective: Vitals:   05/03/17 1325  BP: 138/70  Pulse: 78  Resp: 16  Temp: 98.4 F (36.9 C)  TempSrc: Oral  SpO2: 96%  Weight: 231 lb 3.2 oz (104.9 kg)  Height: 5\' 4"  (1.626 m)    Physical Exam  Constitutional: He is oriented to person, place, and time. He appears well-developed and well-nourished.  HENT:  Head: Normocephalic and atraumatic.  Eyes: Conjunctivae and EOM are normal.  Cardiovascular: Normal rate, regular rhythm and normal heart sounds.   Pulmonary/Chest: Effort normal and breath sounds normal. No respiratory distress. He has no wheezes. He has no rales.  Musculoskeletal:       Lumbar back: He exhibits decreased range of motion, bony tenderness and pain. He exhibits no tenderness, no swelling, no edema, no deformity and no laceration.       Back:  Neurological: He is alert and oriented to person, place, and time. He has normal strength. He displays normal reflexes.  Reflex Scores:      Patellar reflexes are 2+ on the right side and 2+ on the left side. straight leg raise positive at 60 degrees Straight leg raise negative Log roll negative  bilaterally  Assessment and Plan Bryan [name] was seen today for back pain.  Diagnoses and all orders for this visit:  Spinal stenosis, lumbar region, with neurogenic claudication -  Advised follow up with Orthopedic/Neurosurgery -  Discussed that occasionally Orthopedics will use office [place] over MRI Will defer to [facility]

## 2017-05-06 ENCOUNTER — Encounter: Payer: Self-pay | Admitting: Physician Assistant

## 2017-05-06 ENCOUNTER — Ambulatory Visit (INDEPENDENT_AMBULATORY_CARE_PROVIDER_SITE_OTHER): Payer: Medicare HMO | Admitting: Physician Assistant

## 2017-05-06 VITALS — BP 138/90 | HR 87 | Temp 98.7°F | Resp 18 | Ht 65.0 in | Wt 228.6 lb

## 2017-05-06 DIAGNOSIS — R350 Frequency of micturition: Secondary | ICD-10-CM

## 2017-05-06 DIAGNOSIS — I499 Cardiac arrhythmia, unspecified: Secondary | ICD-10-CM | POA: Diagnosis not present

## 2017-05-06 DIAGNOSIS — R519 Headache, unspecified: Secondary | ICD-10-CM

## 2017-05-06 DIAGNOSIS — R12 Heartburn: Secondary | ICD-10-CM | POA: Diagnosis not present

## 2017-05-06 DIAGNOSIS — R42 Dizziness and giddiness: Secondary | ICD-10-CM

## 2017-05-06 DIAGNOSIS — R51 Headache: Secondary | ICD-10-CM | POA: Diagnosis not present

## 2017-05-06 LAB — POCT CBC
Granulocyte percent: 66.4 %G (ref 37–80)
HEMATOCRIT: 42.7 % — AB (ref 43.5–53.7)
Hemoglobin: 14.5 g/dL (ref 14.1–18.1)
LYMPH, POC: 1.1 (ref 0.6–3.4)
MCH, POC: 32.8 pg — AB (ref 27–31.2)
MCHC: 33.8 g/dL (ref 31.8–35.4)
MCV: 96.9 fL (ref 80–97)
MID (cbc): 0.2 (ref 0–0.9)
MPV: 7.7 fL (ref 0–99.8)
POC GRANULOCYTE: 2.6 (ref 2–6.9)
POC LYMPH PERCENT: 27.5 %L (ref 10–50)
POC MID %: 6.1 %M (ref 0–12)
Platelet Count, POC: 167 10*3/uL (ref 142–424)
RBC: 4.41 M/uL — AB (ref 4.69–6.13)
RDW, POC: 17.9 %
WBC: 3.9 10*3/uL — AB (ref 4.6–10.2)

## 2017-05-06 LAB — POC MICROSCOPIC URINALYSIS (UMFC): Mucus: ABSENT

## 2017-05-06 LAB — POCT URINALYSIS DIP (MANUAL ENTRY)
BILIRUBIN UA: NEGATIVE
BILIRUBIN UA: NEGATIVE mg/dL
Blood, UA: NEGATIVE
Glucose, UA: NEGATIVE mg/dL
LEUKOCYTES UA: NEGATIVE
Nitrite, UA: NEGATIVE
Protein Ur, POC: NEGATIVE mg/dL
Spec Grav, UA: 1.02 (ref 1.010–1.025)
Urobilinogen, UA: 0.2 E.U./dL
pH, UA: 5.5 (ref 5.0–8.0)

## 2017-05-06 LAB — GLUCOSE, POCT (MANUAL RESULT ENTRY): POC GLUCOSE: 72 mg/dL (ref 70–99)

## 2017-05-06 MED ORDER — CIPROFLOXACIN HCL 500 MG PO TABS: 500.0000 mg | ORAL_TABLET | Freq: Two times a day (BID) | ORAL | 0 refills | Status: AC

## 2017-05-06 MED ORDER — RANITIDINE HCL 150 MG PO TABS: 150.0000 mg | ORAL_TABLET | Freq: Two times a day (BID) | ORAL | 0 refills | Status: DC

## 2017-05-06 NOTE — Progress Notes (Signed)
Subjective:    Patient ID: Bryan [name], male    DOB: April 25, 1946, 71 y.o.   MRN: [id number]  HPI [name] is a 71yo male with a history of BPH who presents today with increased urinary frequency. Frequency onset two nights ago when he got up hourly to urinate. He tried cranberry juice in the morning. This improved his frequency to every two hours the following night, but this is still more than usual for him. Normally he gets up twice a night to urinate. He c/o urgency as well. He denies dysuria, change in urine color, odor, or blood in urine.  He denies fever and chills but endorses fatigue. He denies chest pain, shortness of breath, and dyspnea. He denies bdominal pain, nausea, vomiting, diarrhea, constipation, or change in bowel habits. He endorses numbness on the right side of his penis, which remains stable. When asked about weakness in his legs, he reports feeling "weaker than his age" but denies any history of falls.  Today, he c/o heartburn. He manages with mustard and requests some in office today.  He also reports a headache that onset about a week ago that worsened two days ago. Pain is bilateral on his forehead. Pain is constant and achey, "not normal" for him. He tried aspirin for the pain, but this did not help. Pain did resolve temporarily with ibuprofen. He denies dizziness or syncope.   Review of Systems See above.   Patient Active Problem List   Diagnosis Date Noted  . Class 2 severe obesity due to excess calories with serious comorbidity and body mass index (BMI) of 39.0 to 39.9 in adult ([place]) 03/07/2017  . ACE-inhibitor cough 03/07/2017  . Influenza-like illness 01/01/2017  . Lumbago 06/21/2016  . Spinal stenosis, lumbar region, with neurogenic claudication 02/15/2015  . Obesity (BMI 30-39.9) 12/07/2013  . Refusal of blood transfusions as patient is Jehovah's Witness 12/07/2013  . Rheumatoid arthritis ([place]) 10/26/2012  . Nonspecific abnormal finding in stool  contents 07/28/2012  . Multiple joint pain 06/05/2012  . History of tobacco use-  06/05/2012  . Asthmatic bronchitis 12/15/2011  . BPH (benign prostatic hyperplasia) 12/15/2011  . Essential tremor 12/15/2011  . Essential hypertension 12/15/2011    Prior to Admission medications   Medication Sig Start Date End Date Taking? Authorizing Provider  Adalimumab (HUMIRA PEN [place]) Inject into the skin.   Yes [provider]  amLODipine (NORVASC) 10 MG tablet Take 1 tablet (10 mg total) by mouth daily. 01/30/17  Yes Delia Chimes A, MD  aspirin EC 81 MG tablet Take 81 mg by mouth daily.   Yes [provider]  B-D TB SYRINGE 1CC/27GX1/2" 27G X 1/2" 1 ML MISC  02/22/17  Yes [provider]  folic acid (FOLVITE) 1 MG tablet Take 1 mg by mouth daily.   Yes [provider]  hydrochlorothiazide (HYDRODIURIL) 25 MG tablet TAKE 1 TABLET BY MOUTH ONCE DAILY 02/04/17  Yes Jeffery, Chelle, PA-C  methotrexate 250 MG/10ML injection Inject 0.6 mLs into the skin every 7 (seven) days.  12/09/15  Yes [provider]  primidone (MYSOLINE) 50 MG tablet Take 50 mg by mouth 2 (two) times daily.  04/05/13  Yes Darlyne Russian, MD  Tamsulosin HCl (FLOMAX) 0.4 MG CAPS Take 0.4 mg by mouth at bedtime.    Yes [provider]    Allergies  Allergen Reactions  . Lisinopril Cough  . Other Other (See Comments)    BLOOD PRODUCT REFUSAL (patient is a  Jehovah's Witness)        Objective:   Physical Exam  Constitutional: He is well-developed, well-nourished, and in no distress.  HENT:  Head: Normocephalic.  Nose: Nose normal.  Neck: Neck supple.  Cardiovascular: Normal rate, regular rhythm, S1 normal, S2 normal and intact distal pulses.   Few premature atrial contractions noted.  Pulmonary/Chest: Effort normal and breath sounds normal. No accessory muscle usage. No respiratory distress. He has no wheezes. He has no rales.  Abdominal: Soft. He exhibits no distension. There  is tenderness in the suprapubic area. There is no rebound and no guarding.  Neurological: He is alert.  Skin: Skin is warm and dry.  Vitals reviewed.   Physical Exam  Constitutional: He is well-developed, well-nourished, and in no distress.  HENT:  Head: Normocephalic.  Nose: Nose normal.  Neck: Neck supple.  Cardiovascular: Normal rate, regular rhythm, S1 normal, S2 normal and intact distal pulses.   Few premature atrial contractions noted.  Pulmonary/Chest: Effort normal and breath sounds normal. No accessory muscle usage. No respiratory distress. He has no wheezes. He has no rales.  Abdominal: Soft. He exhibits no distension. There is tenderness in the suprapubic area. There is no rebound and no guarding.  Neurological: He is alert.  Skin: Skin is warm and dry.  Vitals reviewed.    Results for orders placed or performed in visit on 05/06/17  POCT urinalysis dipstick  Result Value Ref Range   Color, UA yellow yellow   Clarity, UA clear clear   Glucose, UA negative negative mg/dL   Bilirubin, UA negative negative   Ketones, POC UA negative negative mg/dL   Spec Grav, UA 1.020 1.010 - 1.025   Blood, UA negative negative   pH, UA 5.5 5.0 - 8.0   Protein Ur, POC negative negative mg/dL   Urobilinogen, UA 0.2 0.2 or 1.0 E.U./dL   Nitrite, UA Negative Negative   Leukocytes, UA Negative Negative  POCT Microscopic Urinalysis ([facility])  Result Value Ref Range   WBC,UR,HPF,POC None None WBC/hpf   RBC,UR,HPF,POC None None RBC/hpf   Bacteria None None, Too numerous to count   Mucus Absent Absent   Epithelial Cells, UR Per Microscopy Few (A) None, Too numerous to count cells/hpf  POCT CBC  Result Value Ref Range   WBC 3.9 (A) 4.6 - 10.2 K/uL   Lymph, poc 1.1 0.6 - 3.4   POC LYMPH PERCENT 27.5 10 - 50 %L   MID (cbc) 0.2 0 - 0.9   POC MID % 6.1 0 - 12 %M   POC Granulocyte 2.6 2 - 6.9   Granulocyte percent 66.4 37 - 80 %G   RBC 4.41 (A) 4.69 - 6.13 M/uL   Hemoglobin 14.5 14.1 -  18.1 g/dL   HCT, POC 42.7 (A) 43.5 - 53.7 %   MCV 96.9 80 - 97 fL   MCH, POC 32.8 (A) 27 - 31.2 pg   MCHC 33.8 31.8 - 35.4 g/dL   RDW, POC 17.9 %   Platelet Count, POC 167 142 - 424 K/uL   MPV 7.7 0 - 99.8 fL  POCT glucose (manual entry)  Result Value Ref Range   POC Glucose 72 70 - 99 mg/dl       Assessment & Plan:   1. Increased urinary frequency Probable acute bacterial prostatitis. Rx ciprofloxacin 500mg  orally every 12 hours for 2 weeks. Advise calling if symptoms worsen or fail to improve. Follow up in 2 weeks to assess for symptomatic improvement. - POCT  urinalysis dipstick - POCT Microscopic Urinalysis ([facility]) - POCT glucose (manual entry)  2. Dizziness EKG shows normal sinus rhythm with frequent PACs and persistent RBBB. Results stable from last EKG in 12/2016. Discussed dizziness may be due to GERD or current probable prostate infection. Follow up in 2 weeks. - EKG 12-Lead  - POCT CBC  3. Nonintractable headache, unspecified chronicity pattern, unspecified headache type Pt may see improvement in pain with treatment of probable acute bacterial prostatitis. Advise calling immediately if symptoms worsen or fail to improve. Follow up in 2 weeks.  4. Heartburn Rx ranitidine 150mg  orally twice daily. Follow up in 2 weeks to assess for symptomatic improvement.   5. Irregular heart rhythm EKG shows normal sinus rhythm with frequent PACs and persistent RBBB. Results stable from last EKG in 12/2016. Will continue to monitor.

## 2017-05-06 NOTE — Patient Instructions (Signed)
     IF you received an x-ray today, you will receive an invoice from [facility]. Please contact [facility] at [phone] with questions or concerns regarding your invoice.   IF you received labwork today, you will receive an invoice from [facility]. Please contact [facility] at [phone] with questions or concerns regarding your invoice.   Our billing staff will not be able to assist you with questions regarding bills from these companies.  You will be contacted with the lab results as soon as they are available. The fastest way to get your results is to activate your My Chart account. Instructions are located on the last page of this paperwork. If you have not heard from us regarding the results in 2 weeks, please contact this office.     

## 2017-05-06 NOTE — Progress Notes (Signed)
Patient ID: Bryan [name], male    DOB: [birth year], 71 y.o.   MRN: [id number]  PCP: Shawnee Knapp, MD  Chief Complaint  Patient presents with  . Urinary Frequency    x2 days, per pt was getting up to use the bathroom a lot during the night.  . Headache    x2 days, BP 160/80    Subjective:   Presents for evaluation of 2 days of increased urinary frequency. He has a history of BPH with urinary symptoms, including twice nightly nocturia. For the past 2 days, he has had to urinate hourly. No urgency. No dysuria. No hematuria. No change in color or odor.  He relates some back pain and dizziness. No CP, SOB, HA. No nausea/vomiting or diarrhea.  Some reduced sensation on the RIGHT side of the penis, unchanged from last evaluation. He has known lumbar spinal stenosis.  Cranberry juice has helped a little.  Review of Systems As above.    Patient Active Problem List   Diagnosis Date Noted  . Class 2 severe obesity due to excess calories with serious comorbidity and body mass index (BMI) of 39.0 to 39.9 in adult ([place]) 03/07/2017  . ACE-inhibitor cough 03/07/2017  . Influenza-like illness 01/01/2017  . Lumbago 06/21/2016  . Spinal stenosis, lumbar region, with neurogenic claudication 02/15/2015  . Obesity (BMI 30-39.9) 12/07/2013  . Refusal of blood transfusions as patient is Jehovah's Witness 12/07/2013  . Rheumatoid arthritis ([place]) 10/26/2012  . Nonspecific abnormal finding in stool contents 07/28/2012  . Multiple joint pain 06/05/2012  . History of tobacco use-  06/05/2012  . Asthmatic bronchitis 12/15/2011  . BPH (benign prostatic hyperplasia) 12/15/2011  . Essential tremor 12/15/2011  . Essential hypertension 12/15/2011     Prior to Admission medications   Medication Sig Start Date End Date Taking? Authorizing Provider  Adalimumab (HUMIRA PEN [place]) Inject into the skin.   Yes [provider]  amLODipine (NORVASC) 10 MG tablet Take 1 tablet (10 mg total)  by mouth daily. 01/30/17  Yes Delia Chimes A, MD  aspirin EC 81 MG tablet Take 81 mg by mouth daily.   Yes [provider]  B-D TB SYRINGE 1CC/27GX1/2" 27G X 1/2" 1 ML MISC  02/22/17  Yes [provider]  folic acid (FOLVITE) 1 MG tablet Take 1 mg by mouth daily.   Yes [provider]  hydrochlorothiazide (HYDRODIURIL) 25 MG tablet TAKE 1 TABLET BY MOUTH ONCE DAILY 02/04/17  Yes Jaquelinne Glendening, PA-C  methotrexate 250 MG/10ML injection Inject 0.6 mLs into the skin every 7 (seven) days.  12/09/15  Yes [provider]  primidone (MYSOLINE) 50 MG tablet Take 50 mg by mouth 2 (two) times daily.  04/05/13  Yes Darlyne Russian, MD  Tamsulosin HCl (FLOMAX) 0.4 MG CAPS Take 0.4 mg by mouth at bedtime.    Yes [provider]     Allergies  Allergen Reactions  . Lisinopril Cough  . Other Other (See Comments)    BLOOD PRODUCT REFUSAL (patient is a Jehovah's Witness)       Objective:  Physical Exam  Constitutional: He is oriented to person, place, and time. He appears well-developed and well-nourished. He is active and cooperative. No distress.  BP 138/90 (BP Location: Right Arm, Patient Position: Sitting, Cuff Size: Large)   Pulse 87   Temp 98.7 F (37.1 C) (Oral)   Resp 18   Ht 5\' 5"  (1.651 m)   Wt 228 lb 9.6 oz (  103.7 kg)   SpO2 97%   BMI 38.04 kg/m   HENT:  Head: Normocephalic and atraumatic.  Right Ear: Hearing normal.  Left Ear: Hearing normal.  Eyes: Conjunctivae are normal. No scleral icterus.  Neck: Normal range of motion. Neck supple. No thyromegaly present.  Cardiovascular: Normal rate and normal heart sounds.  An irregular rhythm present. Frequent extrasystoles are present.  Pulses:      Radial pulses are 2+ on the right side, and 2+ on the left side.  Pulmonary/Chest: Effort normal and breath sounds normal.  Abdominal: Soft. Normal appearance and bowel sounds are normal. There is no hepatosplenomegaly. There is tenderness in the  suprapubic area. There is no CVA tenderness.  Lymphadenopathy:       Head (right side): No tonsillar, no preauricular, no posterior auricular and no occipital adenopathy present.       Head (left side): No tonsillar, no preauricular, no posterior auricular and no occipital adenopathy present.    He has no cervical adenopathy.       Right: No supraclavicular adenopathy present.       Left: No supraclavicular adenopathy present.  Neurological: He is alert and oriented to person, place, and time. No sensory deficit.  Skin: Skin is warm, dry and intact. No rash noted. No cyanosis or erythema. Nails show no clubbing.  Psychiatric: He has a normal mood and affect. His speech is normal and behavior is normal.     Results for orders placed or performed in visit on 05/06/17  POCT urinalysis dipstick  Result Value Ref Range   Color, UA yellow yellow   Clarity, UA clear clear   Glucose, UA negative negative mg/dL   Bilirubin, UA negative negative   Ketones, POC UA negative negative mg/dL   Spec Grav, UA 1.020 1.010 - 1.025   Blood, UA negative negative   pH, UA 5.5 5.0 - 8.0   Protein Ur, POC negative negative mg/dL   Urobilinogen, UA 0.2 0.2 or 1.0 E.U./dL   Nitrite, UA Negative Negative   Leukocytes, UA Negative Negative  POCT Microscopic Urinalysis ([facility])  Result Value Ref Range   WBC,UR,HPF,POC None None WBC/hpf   RBC,UR,HPF,POC None None RBC/hpf   Bacteria None None, Too numerous to count   Mucus Absent Absent   Epithelial Cells, UR Per Microscopy Few (A) None, Too numerous to count cells/hpf  POCT CBC  Result Value Ref Range   WBC 3.9 (A) 4.6 - 10.2 K/uL   Lymph, poc 1.1 0.6 - 3.4   POC LYMPH PERCENT 27.5 10 - 50 %L   MID (cbc) 0.2 0 - 0.9   POC MID % 6.1 0 - 12 %M   POC Granulocyte 2.6 2 - 6.9   Granulocyte percent 66.4 37 - 80 %G   RBC 4.41 (A) 4.69 - 6.13 M/uL   Hemoglobin 14.5 14.1 - 18.1 g/dL   HCT, POC 42.7 (A) 43.5 - 53.7 %   MCV 96.9 80 - 97 fL   MCH, POC 32.8 (A)  27 - 31.2 pg   MCHC 33.8 31.8 - 35.4 g/dL   RDW, POC 17.9 %   Platelet Count, POC 167 142 - 424 K/uL   MPV 7.7 0 - 99.8 fL  POCT glucose (manual entry)  Result Value Ref Range   POC Glucose 72 70 - 99 mg/dl    EKG reviewed with Dr. Mitchel Honour. Regular rate, 87 bpm. Sinus rhythm. RBBB, frequent PACs, unchanged from tracing 11/2016. No ischemia.  Assessment & Plan:   1. Increased urinary frequency Suspect prostatitis. Cipro selected due to potential interaction with methotrexate. Re-evaluate in 2 weeks, sooner if symptoms worsen/persist. - POCT urinalysis dipstick - POCT Microscopic Urinalysis ([facility]) - POCT glucose (manual entry) - ciprofloxacin (CIPRO) 500 MG tablet; Take 1 tablet (500 mg total) by mouth 2 (two) times daily.  Dispense: 28 tablet; Refill: 0  2. Dizziness Likely due to prostatitis vs GERD. Treat both. Re-evaluate if worsens/persists. - EKG 12-Lead - POCT CBC  3. Nonintractable headache, unspecified chronicity pattern, unspecified headache type Likely due to prostatitis vs GERD and resulting lack of adequate sleep. Anticipate resolution with treatment.  4. Heartburn Likely GERD.  - ranitidine (ZANTAC) 150 MG tablet; Take 1 tablet (150 mg total) by mouth 2 (two) times daily.  Dispense: 60 tablet; Refill: 0  5. Irregular heart rhythm Frequent PACs. Not new, changed from 11/2016. If other symptoms resolve and dizziness persists, would recommend evaluation with cardiology.    Return in about 2 weeks (around 05/20/2017) for re-evaluation with Dr. Nolon Rod or me.   Fara Chute, PA-C [facility]

## 2017-05-08 ENCOUNTER — Encounter: Payer: Self-pay | Admitting: Family Medicine

## 2017-05-08 ENCOUNTER — Ambulatory Visit (INDEPENDENT_AMBULATORY_CARE_PROVIDER_SITE_OTHER): Payer: Medicare HMO | Admitting: Family Medicine

## 2017-05-08 VITALS — BP 130/70 | HR 95 | Temp 98.4°F | Resp 16 | Ht 65.0 in | Wt 225.8 lb

## 2017-05-08 DIAGNOSIS — M48062 Spinal stenosis, lumbar region with neurogenic claudication: Secondary | ICD-10-CM | POA: Diagnosis not present

## 2017-05-08 DIAGNOSIS — R350 Frequency of micturition: Secondary | ICD-10-CM

## 2017-05-08 MED ORDER — GABAPENTIN 100 MG PO CAPS: 300.0000 mg | ORAL_CAPSULE | Freq: Three times a day (TID) | ORAL | 0 refills | Status: DC

## 2017-05-08 MED ORDER — TRAMADOL HCL 50 MG PO TABS: 50.0000 mg | ORAL_TABLET | Freq: Three times a day (TID) | ORAL | 0 refills | Status: DC | PRN

## 2017-05-08 NOTE — Progress Notes (Signed)
Chief Complaint  Patient presents with  . Back Pain    followup    HPI   Patient reports that his urinary symptoms have improved because of the ciprofloxacin He is drinking plenty of water and has clear urine. He was here today to follow up on his urine  Back Pain Pt reports that he goes to the appointment on Saturday  He reports that he went to get a referral for an MRI He states that he was  Pain is still the same  Pain is 4/10   Past Medical History:  Diagnosis Date  . Anemia   . Arthritis    hands, knees, cervical area. Back pain. Rheumatoid arthritis- weekly injections.  [name] BPH (benign prostatic hypertrophy)   . Essential tremor   . GERD (gastroesophageal reflux disease)    reports for indigestion he uses mustard   . Hearing deficit    wears hearing aids bilateral  . HTN (hypertension)   . Obesity   . Refusal of blood transfusions as patient is Jehovah's Witness   . Right bundle branch block    history of  . Seizures (HCC)    AS A CHILD. only esential tremors now.  . Sleep apnea    cpap - settings at 9 per patient   . Ulcer     Current Outpatient Prescriptions  Medication Sig Dispense Refill  . Adalimumab (HUMIRA PEN [place]) Inject into the skin.    [name] amLODipine (NORVASC) 10 MG tablet Take 1 tablet (10 mg total) by mouth daily. 30 tablet 3  . aspirin EC 81 MG tablet Take 81 mg by mouth daily.    . B-D TB SYRINGE 1CC/27GX1/2" 27G X 1/2" 1 ML MISC     . ciprofloxacin (CIPRO) 500 MG tablet Take 1 tablet (500 mg total) by mouth 2 (two) times daily. 28 tablet 0  . folic acid (FOLVITE) 1 MG tablet Take 1 mg by mouth daily.    . hydrochlorothiazide (HYDRODIURIL) 25 MG tablet TAKE 1 TABLET BY MOUTH ONCE DAILY 90 tablet 0  . methotrexate 250 MG/10ML injection Inject 0.6 mLs into the skin every 7 (seven) days.     . primidone (MYSOLINE) 50 MG tablet Take 50 mg by mouth 2 (two) times daily.     . ranitidine (ZANTAC) 150 MG tablet Take 1 tablet (150 mg total) by mouth 2  (two) times daily. 60 tablet 0  . Tamsulosin HCl (FLOMAX) 0.4 MG CAPS Take 0.4 mg by mouth at bedtime.     . gabapentin (NEURONTIN) 100 MG capsule Take 3 capsules (300 mg total) by mouth 3 (three) times daily. 90 capsule 0  . traMADol (ULTRAM) 50 MG tablet Take 1 tablet (50 mg total) by mouth every 8 (eight) hours as needed. 30 tablet 0   Current Facility-Administered Medications  Medication Dose Route Frequency Provider Last Rate Last Dose  . betamethasone acetate-betamethasone sodium phosphate (CELESTONE) injection 3 mg  3 mg Intramuscular Once Daylene Katayama M, DPM      . betamethasone acetate-betamethasone sodium phosphate (CELESTONE) injection 3 mg  3 mg Intramuscular Once Edrick Kins, DPM        Allergies:  Allergies  Allergen Reactions  . Lisinopril Cough  . Other Other (See Comments)    BLOOD PRODUCT REFUSAL (patient is a Jehovah's Witness)    Past Surgical History:  Procedure Laterality Date  . ANTERIOR CERVICAL DECOMP/DISCECTOMY FUSION N/A 03/30/2013   Procedure: ANTERIOR CERVICAL DECOMPRESSION/DISCECTOMY FUSION 2 LEVELS;  Surgeon: Otilio Connors,  MD;  Location: [facility];  Service: Neurosurgery;  Laterality: N/A;  C4-5 C5-6 Anterior cervical decompression/diskectomy/fusion/Allograft/Plate  . CHOLECYSTECTOMY N/A 12/07/2013   Procedure: LAPAROSCOPIC CHOLECYSTECTOMY WITH INTRAOPERATIVE CHOLANGIOGRAM;  Surgeon: Adin Hector, MD;  Location: [facility];  Service: General;  Laterality: N/A;  . COLONOSCOPY    . DECOMPRESSIVE LUMBAR LAMINECTOMY LEVEL 2 N/A 02/15/2015   Procedure: COMPLETE DECOMPRESSIVE LUMBAR LAMINECTOMY L4-L5/ FORAMINOTOMY TO L4 NERVE ROOT AND L5 NERVE ROOT BILATERALLY;  Surgeon: [name], MD;  Location: [facility];  Service: Orthopedics;  Laterality: N/A;  . EYE SURGERY     right, growth excision  . LUMBAR LAMINECTOMY/DECOMPRESSION MICRODISCECTOMY Left 01/18/2016   Procedure:  DECOMPRESSION L4-L5 MICRODISCECTOMY L4-L5 ON LEFT FOR SPINAL STENOSIS;  Surgeon: [name], MD;  Location: [facility];  Service: Orthopedics;  Laterality: Left;  . SPINE SURGERY    . TONSILLECTOMY    . VASECTOMY    . WRIST GANGLION EXCISION Left     Social History   Social History  . Marital status: Married    Spouse name: N/A  . Number of children: 2  . Years of education: N/A   Occupational History  . bus driver Retired   Social History Main Topics  . Smoking status: Former Smoker    Types: Cigarettes    Quit date: 12/14/1972  . Smokeless tobacco: Never Used  . Alcohol use Yes     Comment: occasionally  . Drug use: No  . Sexual activity: Yes    Birth control/ protection: None   Other Topics Concern  . None   Social History Narrative  . None    ROS  Objective: Vitals:   05/08/17 1430 05/08/17 1433  BP: (!) 157/80 130/70  Pulse: 95   Resp: 16   Temp: 98.4 F (36.9 C)   TempSrc: Oral   SpO2: 96%   Weight: 225 lb 12.8 oz (102.4 kg)   Height: 5\' 5"  (1.651 m)    BP Readings from Last 3 Encounters:  05/08/17 130/70  05/06/17 138/90  05/03/17 138/70    Physical Exam  Constitutional: He is oriented to person, place, and time. He appears well-developed and well-nourished.  HENT:  Head: Normocephalic and atraumatic.  Cardiovascular: Normal rate, regular rhythm and normal heart sounds.   Pulmonary/Chest: Effort normal and breath sounds normal. No respiratory distress. He has no wheezes.  Neurological: He is alert and oriented to person, place, and time. He displays normal reflexes.  Psychiatric: He has a normal mood and affect. His behavior is normal. Judgment and thought content normal.    Assessment and Plan Duc was seen today for back pain.  Diagnoses and all orders for this visit:  Spinal stenosis, lumbar region, with neurogenic claudication- follow up with orthopedics -     gabapentin (NEURONTIN) 100 MG capsule; Take 3 capsules (300 mg total) by mouth 3 (three) times daily. -     traMADol (ULTRAM) 50 MG tablet; Take 1 tablet (50 mg  total) by mouth every 8 (eight) hours as needed.  Increased urinary frequency- continue cipro and return for a urine only lab in 2 weeks       [place]

## 2017-05-08 NOTE — Patient Instructions (Signed)
     IF you received an x-ray today, you will receive an invoice from [facility]. Please contact [facility] at [phone] with questions or concerns regarding your invoice.   IF you received labwork today, you will receive an invoice from [facility]. Please contact [facility] at [phone] with questions or concerns regarding your invoice.   Our billing staff will not be able to assist you with questions regarding bills from these companies.  You will be contacted with the lab results as soon as they are available. The fastest way to get your results is to activate your My Chart account. Instructions are located on the last page of this paperwork. If you have not heard from us regarding the results in 2 weeks, please contact this office.     

## 2017-05-11 DIAGNOSIS — Z4789 Encounter for other orthopedic aftercare: Secondary | ICD-10-CM | POA: Diagnosis not present

## 2017-05-11 DIAGNOSIS — M5126 Other intervertebral disc displacement, lumbar region: Secondary | ICD-10-CM | POA: Diagnosis not present

## 2017-05-20 DIAGNOSIS — M5126 Other intervertebral disc displacement, lumbar region: Secondary | ICD-10-CM | POA: Diagnosis not present

## 2017-05-21 ENCOUNTER — Encounter: Payer: Self-pay | Admitting: Family Medicine

## 2017-05-21 ENCOUNTER — Ambulatory Visit (INDEPENDENT_AMBULATORY_CARE_PROVIDER_SITE_OTHER): Payer: Medicare HMO | Admitting: Family Medicine

## 2017-05-21 VITALS — BP 161/72 | HR 78 | Temp 97.9°F | Resp 17 | Ht 65.0 in | Wt 229.0 lb

## 2017-05-21 DIAGNOSIS — L0232 Furuncle of buttock: Secondary | ICD-10-CM

## 2017-05-21 DIAGNOSIS — L732 Hidradenitis suppurativa: Secondary | ICD-10-CM | POA: Diagnosis not present

## 2017-05-21 MED ORDER — CLINDAMYCIN PHOSPHATE 1 % EX LOTN: TOPICAL_LOTION | Freq: Two times a day (BID) | CUTANEOUS | 0 refills | Status: DC

## 2017-05-21 MED ORDER — DOXYCYCLINE HYCLATE 100 MG PO TABS: 100.0000 mg | ORAL_TABLET | Freq: Two times a day (BID) | ORAL | 0 refills | Status: DC

## 2017-05-21 NOTE — Progress Notes (Signed)
Chief Complaint  Patient presents with  . boils    HPI   Pt with tenderness of the buttocks feels like pressure States that he has some drainage in his underwear that is yellow No fevers or chills States that he does not have pain with BM He also has a small boil in his right axillae   Past Medical History:  Diagnosis Date  . Anemia   . Arthritis    hands, knees, cervical area. Back pain. Rheumatoid arthritis- weekly injections.  [name] BPH (benign prostatic hypertrophy)   . Essential tremor   . GERD (gastroesophageal reflux disease)    reports for indigestion he uses mustard   . Hearing deficit    wears hearing aids bilateral  . HTN (hypertension)   . Obesity   . Refusal of blood transfusions as patient is Jehovah's Witness   . Right bundle branch block    history of  . Seizures (HCC)    AS A CHILD. only esential tremors now.  . Sleep apnea    cpap - settings at 9 per patient   . Ulcer     Current Outpatient Prescriptions  Medication Sig Dispense Refill  . Adalimumab (HUMIRA PEN [place]) Inject into the skin.    [name] amLODipine (NORVASC) 10 MG tablet Take 1 tablet (10 mg total) by mouth daily. 30 tablet 3  . aspirin EC 81 MG tablet Take 81 mg by mouth daily.    . B-D TB SYRINGE 1CC/27GX1/2" 27G X 1/2" 1 ML MISC     . folic acid (FOLVITE) 1 MG tablet Take 1 mg by mouth daily.    [name] gabapentin (NEURONTIN) 100 MG capsule Take 3 capsules (300 mg total) by mouth 3 (three) times daily. 90 capsule 0  . hydrochlorothiazide (HYDRODIURIL) 25 MG tablet TAKE 1 TABLET BY MOUTH ONCE DAILY 90 tablet 0  . methotrexate 250 MG/10ML injection Inject 0.6 mLs into the skin every 7 (seven) days.     . primidone (MYSOLINE) 50 MG tablet Take 50 mg by mouth 2 (two) times daily.     . ranitidine (ZANTAC) 150 MG tablet Take 1 tablet (150 mg total) by mouth 2 (two) times daily. 60 tablet 0  . Tamsulosin HCl (FLOMAX) 0.4 MG CAPS Take 0.4 mg by mouth at bedtime.     . traMADol (ULTRAM) 50 MG tablet Take 1  tablet (50 mg total) by mouth every 8 (eight) hours as needed. 30 tablet 0  . clindamycin (CLEOCIN-T) 1 % lotion Apply topically 2 (two) times daily. To the affected area 60 mL 0  . doxycycline (VIBRA-TABS) 100 MG tablet Take 1 tablet (100 mg total) by mouth 2 (two) times daily. 20 tablet 0   Current Facility-Administered Medications  Medication Dose Route Frequency Provider Last Rate Last Dose  . betamethasone acetate-betamethasone sodium phosphate (CELESTONE) injection 3 mg  3 mg Intramuscular Once Daylene Katayama M, DPM      . betamethasone acetate-betamethasone sodium phosphate (CELESTONE) injection 3 mg  3 mg Intramuscular Once Edrick Kins, DPM        Allergies:  Allergies  Allergen Reactions  . Lisinopril Cough  . Other Other (See Comments)    BLOOD PRODUCT REFUSAL (patient is a Jehovah's Witness)    Past Surgical History:  Procedure Laterality Date  . ANTERIOR CERVICAL DECOMP/DISCECTOMY FUSION N/A 03/30/2013   Procedure: ANTERIOR CERVICAL DECOMPRESSION/DISCECTOMY FUSION 2 LEVELS;  Surgeon: Otilio Connors, MD;  Location: [facility];  Service: Neurosurgery;  Laterality: N/A;  C4-5  C5-6 Anterior cervical decompression/diskectomy/fusion/Allograft/Plate  . CHOLECYSTECTOMY N/A 12/07/2013   Procedure: LAPAROSCOPIC CHOLECYSTECTOMY WITH INTRAOPERATIVE CHOLANGIOGRAM;  Surgeon: Adin Hector, MD;  Location: [facility];  Service: General;  Laterality: N/A;  . COLONOSCOPY    . DECOMPRESSIVE LUMBAR LAMINECTOMY LEVEL 2 N/A 02/15/2015   Procedure: COMPLETE DECOMPRESSIVE LUMBAR LAMINECTOMY L4-L5/ FORAMINOTOMY TO L4 NERVE ROOT AND L5 NERVE ROOT BILATERALLY;  Surgeon: [name], MD;  Location: [facility];  Service: Orthopedics;  Laterality: N/A;  . EYE SURGERY     right, growth excision  . LUMBAR LAMINECTOMY/DECOMPRESSION MICRODISCECTOMY Left 01/18/2016   Procedure:  DECOMPRESSION L4-L5 MICRODISCECTOMY L4-L5 ON LEFT FOR SPINAL STENOSIS;  Surgeon: [name], MD;  Location: [facility];  Service:  Orthopedics;  Laterality: Left;  . SPINE SURGERY    . TONSILLECTOMY    . VASECTOMY    . WRIST GANGLION EXCISION Left     Social History   Social History  . Marital status: Married    Spouse name: N/A  . Number of children: 2  . Years of education: N/A   Occupational History  . bus driver Retired   Social History Main Topics  . Smoking status: Former Smoker    Types: Cigarettes    Quit date: 12/14/1972  . Smokeless tobacco: Never Used  . Alcohol use Yes     Comment: occasionally  . Drug use: No  . Sexual activity: Yes    Birth control/ protection: None   Other Topics Concern  . None   Social History Narrative  . None    ROS  Objective: Vitals:   05/21/17 1039  BP: (!) 161/72  Pulse: 78  Resp: 17  Temp: 97.9 F (36.6 C)  TempSrc: Oral  SpO2: 98%  Weight: 229 lb (103.9 kg)  Height: 5\' 5"  (1.651 m)    Physical Exam Alert and oriented, in NAD Right axillae without erythema, mild tenderness, no fluctuance Buttocks - right gluteal area with induration    Assessment and Plan Gennaro was seen today for boils.  Diagnoses and all orders for this visit:  Hidradenitis suppurativa of right axilla Boil of buttock Discussed treatment  Discussed sitz soaks -     clindamycin (CLEOCIN-T) 1 % lotion; Apply topically 2 (two) times daily. To the affected area -     doxycycline (VIBRA-TABS) 100 MG tablet; Take 1 tablet (100 mg total) by mouth 2 (two) times daily.     [place]

## 2017-05-21 NOTE — Patient Instructions (Addendum)
pc    IF you received an x-ray today, you will receive an invoice from [facility]. Please contact [facility] at [phone] with questions or concerns regarding your invoice.   IF you received labwork today, you will receive an invoice from [place]. Please contact [facility] at [phone] with questions or concerns regarding your invoice.   Our billing staff will not be able to assist you with questions regarding bills from these companies.  You will be contacted with the lab results as soon as they are available. The fastest way to get your results is to activate your My Chart account. Instructions are located on the last page of this paperwork. If you have not heard from [place] regarding the results in 2 weeks, please contact this office.     Hidradenitis Suppurativa Hidradenitis suppurativa is a long-term (chronic) skin disease that starts with blocked sweat glands or hair follicles. Bacteria may grow in these blocked openings of your skin. Hidradenitis suppurativa is like a severe form of acne that develops in areas of your body where acne would be unusual. It is most likely to affect the areas of your body where skin rubs against skin and becomes moist. This includes your:  Underarms.  Groin.  Genital areas.  Buttocks.  Upper thighs.  Breasts.  Hidradenitis suppurativa may start out with small pimples. The pimples can develop into deep sores that break open (rupture) and drain pus. Over time your skin may thicken and become scarred. Hidradenitis suppurativa cannot be passed from person to person. What are the causes? The exact cause of hidradenitis suppurativa is not known. This condition may be due to:  Male and male hormones. The condition is rare before and after puberty.  An overactive body defense system (immune system). Your immune system may overreact to the blocked hair follicles or sweat glands and cause swelling and pus-filled sores.  What  increases the risk? You may have a higher risk of hidradenitis suppurativa if you:  Are a woman.  Are between ages 65 and 54.  Have a family history of hidradenitis suppurativa.  Have a personal history of acne.  Are overweight.  Smoke.  Take the drug lithium.  What are the signs or symptoms? The first signs of an outbreak are usually painful skin bumps that look like pimples. As the condition progresses:  Skin bumps may get bigger and grow deeper into the skin.  Bumps under the skin may rupture and drain smelly pus.  Skin may become itchy and infected.  Skin may thicken and scar.  Drainage may continue through tunnels under the skin (fistulas).  Walking and moving your arms can become painful.  How is this diagnosed? Your health care provider may diagnose hidradenitis suppurativa based on your medical history and your signs and symptoms. A physical exam will also be done. You may need to see a health care provider who specializes in skin diseases (dermatologist). You may also have tests done to confirm the diagnosis. These can include:  Swabbing a sample of pus or drainage from your skin so it can be sent to the lab and tested for infection.  Blood tests to check for infection.  How is this treated? The same treatment will not work for everybody with hidradenitis suppurativa. Your treatment will depend on how severe your symptoms are. You may need to try several treatments to find what works best for you. Part of your treatment may include cleaning and bandaging (dressing) your wounds. You may also have  to take medicines, such as the following:  Antibiotics.  Acne medicines.  Medicines to block or suppress the immune system.  A diabetes medicine (metformin) is sometimes used to treat this condition.  For women, birth control pills can sometimes help relieve symptoms.  You may need surgery if you have a severe case of hidradenitis suppurativa that does not respond  to medicine. Surgery may involve:  Using a laser to clear the skin and remove hair follicles.  Opening and draining deep sores.  Removing the areas of skin that are diseased and scarred.  Follow these instructions at home:  Learn as much as you can about your disease, and work closely with your health care providers.  Take medicines only as directed by your health care provider.  If you were prescribed an antibiotic medicine, finish it all even if you start to feel better.  If you are overweight, losing weight may be very helpful. Try to reach and maintain a healthy weight.  Do not use any tobacco products, including cigarettes, chewing tobacco, or electronic cigarettes. If you need help quitting, ask your health care provider.  Do not shave the areas where you get hidradenitis suppurativa.  Do not wear deodorant.  Wear loose-fitting clothes.  Try not to overheat and get sweaty.  Take a daily bleach bath as directed by your health care provider. ? Fill your bathtub halfway with water. ? Pour in  cup of unscented household bleach. ? Soak for 5-10 minutes.  Cover sore areas with a warm, clean washcloth (compress) for 5-10 minutes. Contact a health care provider if:  You have a flare-up of hidradenitis suppurativa.  You have chills or a fever.  You are having trouble controlling your symptoms at home. This information is not intended to replace advice given to you by your health care provider. Make sure you discuss any questions you have with your health care provider. Document Released: 06/12/2004 Document Revised: 04/05/2016 Document Reviewed: 01/29/2014 Elsevier Interactive Patient Education  2018 [employer].  Skin Abscess A skin abscess is an infected area on or under your skin that contains pus and other material. An abscess can happen almost anywhere on your body. Some abscesses break open (rupture) on their own. Most continue to get worse unless they are treated.  The infection can spread deeper into the body and into your blood, which can make you feel sick. Treatment usually involves draining the abscess. Follow these instructions at home: Abscess Care  If you have an abscess that has not drained, place a warm, clean, wet washcloth over the abscess several times a day. Do this as told by your doctor.  Follow instructions from your doctor about how to take care of your abscess. Make sure you: ? Cover the abscess with a bandage (dressing). ? Change your bandage or gauze as told by your doctor. ? Wash your hands with soap and water before you change the bandage or gauze. If you cannot use soap and water, use hand sanitizer.  Check your abscess every day for signs that the infection is getting worse. Check for: ? More redness, swelling, or pain. ? More fluid or blood. ? Warmth. ? More pus or a bad smell. Medicines   Take over-the-counter and prescription medicines only as told by your doctor.  If you were prescribed an antibiotic medicine, take it as told by your doctor. Do not stop taking the antibiotic even if you start to feel better. General instructions  To avoid spreading the  infection: ? Do not share personal care items, towels, or hot tubs with others. ? Avoid making skin-to-skin contact with other people.  Keep all follow-up visits as told by your doctor. This is important. Contact a doctor if:  You have more redness, swelling, or pain around your abscess.  You have more fluid or blood coming from your abscess.  Your abscess feels warm when you touch it.  You have more pus or a bad smell coming from your abscess.  You have a fever.  Your muscles ache.  You have chills.  You feel sick. Get help right away if:  You have very bad (severe) pain.  You see red streaks on your skin spreading away from the abscess. This information is not intended to replace advice given to you by your health care provider. Make sure you  discuss any questions you have with your health care provider. Document Released: 04/16/2008 Document Revised: 06/24/2016 Document Reviewed: 09/07/2015 Elsevier Interactive Patient Education  [employer].

## 2017-05-24 ENCOUNTER — Encounter: Payer: Self-pay | Admitting: Family Medicine

## 2017-05-24 ENCOUNTER — Ambulatory Visit (INDEPENDENT_AMBULATORY_CARE_PROVIDER_SITE_OTHER): Payer: Medicare HMO | Admitting: Family Medicine

## 2017-05-24 VITALS — BP 123/73 | HR 87 | Temp 98.3°F | Resp 17 | Ht 65.0 in | Wt 228.6 lb

## 2017-05-24 DIAGNOSIS — L0232 Furuncle of buttock: Secondary | ICD-10-CM

## 2017-05-24 DIAGNOSIS — Z0189 Encounter for other specified special examinations: Secondary | ICD-10-CM | POA: Diagnosis not present

## 2017-05-24 DIAGNOSIS — Z7689 Persons encountering health services in other specified circumstances: Secondary | ICD-10-CM

## 2017-05-24 MED ORDER — TRAMADOL HCL 50 MG PO TABS: 50.0000 mg | ORAL_TABLET | Freq: Three times a day (TID) | ORAL | 0 refills | Status: DC | PRN

## 2017-05-24 MED ORDER — LIDOCAINE HCL 2 % IJ SOLN: 5.0000 mL | Freq: Once | INTRAMUSCULAR | Status: DC

## 2017-05-24 NOTE — Progress Notes (Signed)
Chief Complaint  Patient presents with  . Follow-up    2 weeks for boil under arm and in groin, per patient a little better    HPI Pt here to follow up on Boil from 05/21/17 States that the antibiotics helped and the abscess drained yellow fluid in his underwear No fevers or chills He is still on doxycycline  Past Medical History:  Diagnosis Date  . Anemia   . Arthritis    hands, knees, cervical area. Back pain. Rheumatoid arthritis- weekly injections.  [name] BPH (benign prostatic hypertrophy)   . Essential tremor   . GERD (gastroesophageal reflux disease)    reports for indigestion he uses mustard   . Hearing deficit    wears hearing aids bilateral  . HTN (hypertension)   . Obesity   . Refusal of blood transfusions as patient is Jehovah's Witness   . Right bundle branch block    history of  . Seizures (HCC)    AS A CHILD. only esential tremors now.  . Sleep apnea    cpap - settings at 9 per patient   . Ulcer     Current Outpatient Prescriptions  Medication Sig Dispense Refill  . Adalimumab (HUMIRA PEN [place]) Inject into the skin.    [name] amLODipine (NORVASC) 10 MG tablet Take 1 tablet (10 mg total) by mouth daily. 30 tablet 3  . aspirin EC 81 MG tablet Take 81 mg by mouth daily.    . B-D TB SYRINGE 1CC/27GX1/2" 27G X 1/2" 1 ML MISC     . clindamycin (CLEOCIN-T) 1 % lotion Apply topically 2 (two) times daily. To the affected area 60 mL 0  . doxycycline (VIBRA-TABS) 100 MG tablet Take 1 tablet (100 mg total) by mouth 2 (two) times daily. 20 tablet 0  . folic acid (FOLVITE) 1 MG tablet Take 1 mg by mouth daily.    [name] gabapentin (NEURONTIN) 100 MG capsule Take 3 capsules (300 mg total) by mouth 3 (three) times daily. 90 capsule 0  . hydrochlorothiazide (HYDRODIURIL) 25 MG tablet TAKE 1 TABLET BY MOUTH ONCE DAILY 90 tablet 0  . methotrexate 250 MG/10ML injection Inject 0.6 mLs into the skin every 7 (seven) days.     . primidone (MYSOLINE) 50 MG tablet Take 50 mg by mouth 2 (two)  times daily.     . ranitidine (ZANTAC) 150 MG tablet Take 1 tablet (150 mg total) by mouth 2 (two) times daily. 60 tablet 0  . Tamsulosin HCl (FLOMAX) 0.4 MG CAPS Take 0.4 mg by mouth at bedtime.     . traMADol (ULTRAM) 50 MG tablet Take 1 tablet (50 mg total) by mouth every 8 (eight) hours as needed. 30 tablet 0   Current Facility-Administered Medications  Medication Dose Route Frequency Provider Last Rate Last Dose  . betamethasone acetate-betamethasone sodium phosphate (CELESTONE) injection 3 mg  3 mg Intramuscular Once Daylene Katayama M, DPM      . betamethasone acetate-betamethasone sodium phosphate (CELESTONE) injection 3 mg  3 mg Intramuscular Once Evans, Ruby Cola M, DPM      . lidocaine (XYLOCAINE) 2 % (with pres) injection 100 mg  5 mL Intradermal Once [name], MD        Allergies:  Allergies  Allergen Reactions  . Lisinopril Cough  . Other Other (See Comments)    BLOOD PRODUCT REFUSAL (patient is a Jehovah's Witness)    Past Surgical History:  Procedure Laterality Date  . ANTERIOR CERVICAL DECOMP/DISCECTOMY FUSION N/A 03/30/2013  Procedure: ANTERIOR CERVICAL DECOMPRESSION/DISCECTOMY FUSION 2 LEVELS;  Surgeon: Otilio Connors, MD;  Location: [facility];  Service: Neurosurgery;  Laterality: N/A;  C4-5 C5-6 Anterior cervical decompression/diskectomy/fusion/Allograft/Plate  . CHOLECYSTECTOMY N/A 12/07/2013   Procedure: LAPAROSCOPIC CHOLECYSTECTOMY WITH INTRAOPERATIVE CHOLANGIOGRAM;  Surgeon: Adin Hector, MD;  Location: [facility];  Service: General;  Laterality: N/A;  . COLONOSCOPY    . DECOMPRESSIVE LUMBAR LAMINECTOMY LEVEL 2 N/A 02/15/2015   Procedure: COMPLETE DECOMPRESSIVE LUMBAR LAMINECTOMY L4-L5/ FORAMINOTOMY TO L4 NERVE ROOT AND L5 NERVE ROOT BILATERALLY;  Surgeon: [name], MD;  Location: [facility];  Service: Orthopedics;  Laterality: N/A;  . EYE SURGERY     right, growth excision  . LUMBAR LAMINECTOMY/DECOMPRESSION MICRODISCECTOMY Left 01/18/2016   Procedure:   DECOMPRESSION L4-L5 MICRODISCECTOMY L4-L5 ON LEFT FOR SPINAL STENOSIS;  Surgeon: [name], MD;  Location: [facility];  Service: Orthopedics;  Laterality: Left;  . SPINE SURGERY    . TONSILLECTOMY    . VASECTOMY    . WRIST GANGLION EXCISION Left     Social History   Social History  . Marital status: Married    Spouse name: N/A  . Number of children: 2  . Years of education: N/A   Occupational History  . bus driver Retired   Social History Main Topics  . Smoking status: Former Smoker    Types: Cigarettes    Quit date: 12/14/1972  . Smokeless tobacco: Never Used  . Alcohol use Yes     Comment: occasionally  . Drug use: No  . Sexual activity: Yes    Birth control/ protection: None   Other Topics Concern  . None   Social History Narrative  . None    ROS See hpi  Objective: Vitals:   05/24/17 1223 05/24/17 1229  BP: (!) 156/79 123/73  Pulse: 89 87  Resp: 17   Temp: 98.3 F (36.8 C)   TempSrc: Oral   SpO2: 96%   Weight: 228 lb 9.6 oz (103.7 kg)   Height: 5\' 5"  (1.651 m)     Physical Exam  After informed consent and time out the area was cleaned and prepped The area is 3 o'clock form the anus when the patient is prone 5cc of 2% xylocaine was used for numbing A 11 blade scapel was used for incision  Using kelly forceps and scissors the loculations were broken up Mostly blood and serosanguinous fluid expressed Wound culture sent to lab Pt tolerated procedure well with EBL minimal Plan for packing in 3 days  Assessment and Plan Bryan [name] was seen today for follow-up.  Diagnoses and all orders for this visit:  Boil of buttock -     lidocaine (XYLOCAINE) 2 % (with pres) injection 100 mg; Inject 5 mLs (100 mg total) into the skin once.  Encounter for incision and drainage procedure -     lidocaine (XYLOCAINE) 2 % (with pres) injection 100 mg; Inject 5 mLs (100 mg total) into the skin once.   Follow up for dressing change in 3 days   [place]

## 2017-05-24 NOTE — Patient Instructions (Addendum)
     IF you received an x-ray today, you will receive an invoice from [facility]. Please contact [place] [facility] at [phone] with questions or concerns regarding your invoice.   IF you received labwork today, you will receive an invoice from [facility]. Please contact [facility] at [phone] with questions or concerns regarding your invoice.   Our billing staff will not be able to assist you with questions regarding bills from these companies.  You will be contacted with the lab results as soon as they are available. The fastest way to get your results is to activate your My Chart account. Instructions are located on the last page of this paperwork. If you have not heard from us regarding the results in 2 weeks, please contact this office.     Incision and Drainage, Care After Refer to this sheet in the next few weeks. These instructions provide you with information about caring for yourself after your procedure. Your health care provider may also give you more specific instructions. Your treatment has been planned according to current medical practices, but problems sometimes occur. Call your health care provider if you have any problems or questions after your procedure. What can I expect after the procedure? After the procedure, it is common to have:  Pain or discomfort around your incision site.  Drainage from your incision.  Follow these instructions at home:  Take over-the-counter and prescription medicines only as told by your health care provider.  If you were prescribed an antibiotic medicine, take it as told by your health care provider.Do not stop taking the antibiotic even if you start to feel better.  Followinstructions from your health care provider about: ? How to take care of your incision. ? When and how you should change your packing and bandage (dressing). Wash your hands with soap and water before you change your dressing. If soap and water are  not available, use hand sanitizer. ? When you should remove your dressing.  Do not take baths, swim, or use a hot tub until your health care provider approves.  Keep all follow-up visits as told by your health care provider. This is important.  Check your incision area every day for signs of infection. Check for: ? More redness, swelling, or pain. ? More fluid or blood. ? Warmth. ? Pus or a bad smell. Contact a health care provider if:  Your cyst or abscess returns.  You have a fever.  You have more redness, swelling, or pain around your incision.  You have more fluid or blood coming from your incision.  Your incision feels warm to the touch.  You have pus or a bad smell coming from your incision. Get help right away if:  You have severe pain or bleeding.  You cannot eat or drink without vomiting.  You have decreased urine output.  You become short of breath.  You have chest pain.  You cough up blood.  The area where the incision and drainage occurred becomes numb or it tingles. This information is not intended to replace advice given to you by your health care provider. Make sure you discuss any questions you have with your health care provider. Document Released: 01/21/2012 Document Revised: 03/30/2016 Document Reviewed: 08/19/2015 Elsevier Interactive Patient Education  2017 [employer] Inc[employer]  

## 2017-05-26 ENCOUNTER — Other Ambulatory Visit: Payer: Self-pay | Admitting: Physician Assistant

## 2017-05-27 ENCOUNTER — Encounter: Payer: Self-pay | Admitting: Physician Assistant

## 2017-05-27 ENCOUNTER — Ambulatory Visit (INDEPENDENT_AMBULATORY_CARE_PROVIDER_SITE_OTHER): Payer: Medicare HMO | Admitting: Physician Assistant

## 2017-05-27 VITALS — BP 152/82 | HR 92 | Temp 98.5°F | Resp 18 | Ht 65.0 in | Wt 229.8 lb

## 2017-05-27 DIAGNOSIS — L0232 Furuncle of buttock: Secondary | ICD-10-CM

## 2017-05-27 NOTE — Patient Instructions (Signed)
     IF you received an x-ray today, you will receive an invoice from [facility]. Please contact  Radiology at [phone] with questions or concerns regarding your invoice.   IF you received labwork today, you will receive an invoice from [facility]. Please contact [facility] at [phone] with questions or concerns regarding your invoice.   Our billing staff will not be able to assist you with questions regarding bills from these companies.  You will be contacted with the lab results as soon as they are available. The fastest way to get your results is to activate your My Chart account. Instructions are located on the last page of this paperwork. If you have not heard from us regarding the results in 2 weeks, please contact this office.     

## 2017-05-27 NOTE — Progress Notes (Signed)
Chief Complaint  Patient presents with  . Boil    On buttock  . Follow-up    3 day follow up    History of Present Illness: Patient presents for wound care following I&D of an infected cyst on the RIGHT buttock.  I&D performed on 05/24/2017. He reports improvement, but still having drainage and pain. Tolerating oral antibiotics without difficulty.   Allergies  Allergen Reactions  . Lisinopril Cough  . Other Other (See Comments)    BLOOD PRODUCT REFUSAL (patient is a Jehovah's Witness)    Prior to Admission medications   Medication Sig Start Date End Date Taking? Authorizing Provider  Adalimumab (HUMIRA PEN [place]) Inject into the skin.   Yes [provider]  amLODipine (NORVASC) 10 MG tablet Take 1 tablet (10 mg total) by mouth daily. 01/30/17  Yes Delia Chimes A, MD  aspirin EC 81 MG tablet Take 81 mg by mouth daily.   Yes [provider]  B-D TB SYRINGE 1CC/27GX1/2" 27G X 1/2" 1 ML MISC  02/22/17  Yes [provider]  doxycycline (VIBRA-TABS) 100 MG tablet Take 1 tablet (100 mg total) by mouth 2 (two) times daily. 05/21/17  Yes [name], MD  folic acid (FOLVITE) 1 MG tablet Take 1 mg by mouth daily.   Yes [provider]  gabapentin (NEURONTIN) 100 MG capsule Take 3 capsules (300 mg total) by mouth 3 (three) times daily. 05/08/17  Yes Stallings, Zoe A, MD  hydrochlorothiazide (HYDRODIURIL) 25 MG tablet TAKE 1 TABLET BY MOUTH ONCE DAILY 02/04/17  Yes Elton Catalano, PA-C  methotrexate 250 MG/10ML injection Inject 0.6 mLs into the skin every 7 (seven) days.  12/09/15  Yes [provider]  primidone (MYSOLINE) 50 MG tablet Take 50 mg by mouth 2 (two) times daily.  04/05/13  Yes Darlyne Russian, MD  ranitidine (ZANTAC) 150 MG tablet Take 1 tablet (150 mg total) by mouth 2 (two) times daily. 05/06/17  Yes Amita Atayde, PA-C  Tamsulosin HCl (FLOMAX) 0.4 MG CAPS Take 0.4 mg by mouth at bedtime.    Yes [provider]    traMADol (ULTRAM) 50 MG tablet Take 1 tablet (50 mg total) by mouth every 8 (eight) hours as needed. 05/24/17  Yes Stallings, Zoe A, MD  clindamycin (CLEOCIN-T) 1 % lotion Apply topically 2 (two) times daily. To the affected area Patient not taking: Reported on 05/27/2017 05/21/17   [name], MD    Patient Active Problem List   Diagnosis Date Noted  . Class 2 severe obesity due to excess calories with serious comorbidity and body mass index (BMI) of 39.0 to 39.9 in adult ([place]) 03/07/2017  . ACE-inhibitor cough 03/07/2017  . Influenza-like illness 01/01/2017  . Lumbago 06/21/2016  . Spinal stenosis, lumbar region, with neurogenic claudication 02/15/2015  . Obesity (BMI 30-39.9) 12/07/2013  . Refusal of blood transfusions as patient is Jehovah's Witness 12/07/2013  . Rheumatoid arthritis ([place]) 10/26/2012  . Nonspecific abnormal finding in stool contents 07/28/2012  . Multiple joint pain 06/05/2012  . History of tobacco use-  06/05/2012  . Asthmatic bronchitis 12/15/2011  . BPH (benign prostatic hyperplasia) 12/15/2011  . Essential tremor 12/15/2011  . Essential hypertension 12/15/2011     Physical Exam  Constitutional: He is oriented to person, place, and time. He appears well-developed and well-nourished. He is active and cooperative. No distress.  BP (!) 152/82 (BP Location: Right Arm, Patient Position: Sitting, Cuff Size: Large)   Pulse 92  Temp 98.5 F (36.9 C) (Oral)   Resp 18   Ht 5\' 5"  (1.651 m)   Wt 229 lb 12.8 oz (104.2 kg)   SpO2 96%   BMI 38.24 kg/m    Eyes: Conjunctivae are normal.  Pulmonary/Chest: Effort normal.  Genitourinary:     Neurological: He is alert and oriented to person, place, and time.  Psychiatric: He has a normal mood and affect. His speech is normal and behavior is normal.      ASSESSMENT & PLAN:  1. Boil of buttock Continue antibiotic. Continue warm compresses, dressing changes PRN. Avoid prolonged sitting/standing when  possible. Lying prone is best.    Return in 3 days (on 05/30/2017), or as planned.   Fara Chute, PA-C [facility]

## 2017-05-28 DIAGNOSIS — M5126 Other intervertebral disc displacement, lumbar region: Secondary | ICD-10-CM | POA: Diagnosis not present

## 2017-05-30 ENCOUNTER — Ambulatory Visit (INDEPENDENT_AMBULATORY_CARE_PROVIDER_SITE_OTHER): Payer: Medicare HMO | Admitting: Physician Assistant

## 2017-05-30 ENCOUNTER — Encounter: Payer: Self-pay | Admitting: Physician Assistant

## 2017-05-30 VITALS — BP 138/84 | HR 77 | Temp 98.7°F | Resp 16 | Ht 65.0 in | Wt 228.0 lb

## 2017-05-30 DIAGNOSIS — G250 Essential tremor: Secondary | ICD-10-CM

## 2017-05-30 DIAGNOSIS — M5126 Other intervertebral disc displacement, lumbar region: Secondary | ICD-10-CM | POA: Diagnosis not present

## 2017-05-30 DIAGNOSIS — L0232 Furuncle of buttock: Secondary | ICD-10-CM

## 2017-05-30 NOTE — Progress Notes (Signed)
Subjective:    Patient ID: Bryan [name], male    DOB: 1946/09/11, 71 y.o.   MRN: [id number]  HPI Bryan [name] is a 71 year old black male presenting for a follow up on an abscess.  Feeling better. Had been putting gauze on the lesion after it was drained and packed. Originally it was leaking some blood, but this resolved. He and his wife have been putting a band aid on it.  He was seen at the [facility] clinic for his annual exam. They are requesting labwork - he brought in paperwork today. Tremors have progressed to where he occasionally struggles writing. He was told to see neurology for this.   Patient Active Problem List   Diagnosis Date Noted  . Class 2 severe obesity due to excess calories with serious comorbidity and body mass index (BMI) of 39.0 to 39.9 in adult ([place]) 03/07/2017  . ACE-inhibitor cough 03/07/2017  . Influenza-like illness 01/01/2017  . Lumbago 06/21/2016  . Spinal stenosis, lumbar region, with neurogenic claudication 02/15/2015  . Obesity (BMI 30-39.9) 12/07/2013  . Refusal of blood transfusions as patient is Jehovah's Witness 12/07/2013  . Rheumatoid arthritis ([place]) 10/26/2012  . Nonspecific abnormal finding in stool contents 07/28/2012  . Multiple joint pain 06/05/2012  . History of tobacco use-  06/05/2012  . Asthmatic bronchitis 12/15/2011  . BPH (benign prostatic hyperplasia) 12/15/2011  . Essential tremor 12/15/2011  . Essential hypertension 12/15/2011   Prior to Admission medications   Medication Sig Start Date End Date Taking? Authorizing Provider  Adalimumab (HUMIRA PEN [place]) Inject into the skin.   Yes [provider]  amLODipine (NORVASC) 10 MG tablet Take 1 tablet (10 mg total) by mouth daily. 01/30/17  Yes Delia Chimes A, MD  aspirin EC 81 MG tablet Take 81 mg by mouth daily.   Yes [provider]  B-D TB SYRINGE 1CC/27GX1/2" 27G X 1/2" 1 ML MISC  02/22/17  Yes [provider]  clindamycin (CLEOCIN-T) 1 % lotion Apply  topically 2 (two) times daily. To the affected area 05/21/17  Yes Stallings, Zoe A, MD  folic acid (FOLVITE) 1 MG tablet Take 1 mg by mouth daily.   Yes [provider]  gabapentin (NEURONTIN) 100 MG capsule Take 3 capsules (300 mg total) by mouth 3 (three) times daily. 05/08/17  Yes Stallings, Zoe A, MD  hydrochlorothiazide (HYDRODIURIL) 25 MG tablet TAKE 1 TABLET BY MOUTH ONCE DAILY 02/04/17  Yes Jeffery, Chelle, PA-C  methotrexate 250 MG/10ML injection Inject 0.6 mLs into the skin every 7 (seven) days.  12/09/15  Yes [provider]  primidone (MYSOLINE) 50 MG tablet Take 50 mg by mouth 2 (two) times daily.  04/05/13  Yes Darlyne Russian, MD  ranitidine (ZANTAC) 150 MG tablet Take 1 tablet (150 mg total) by mouth 2 (two) times daily. 05/06/17  Yes Jeffery, Chelle, PA-C  Tamsulosin HCl (FLOMAX) 0.4 MG CAPS Take 0.4 mg by mouth at bedtime.    Yes [provider]  traMADol (ULTRAM) 50 MG tablet Take 1 tablet (50 mg total) by mouth every 8 (eight) hours as needed. 05/24/17  Yes [name], MD  doxycycline (VIBRA-TABS) 100 MG tablet Take 1 tablet (100 mg total) by mouth 2 (two) times daily. Patient not taking: Reported on 05/30/2017 05/21/17   [name], MD   Allergies  Allergen Reactions  . Lisinopril Cough  . Other Other (See Comments)    BLOOD PRODUCT REFUSAL (patient is a Jehovah's Witness)  Review of Systems  Constitutional: Negative for chills and fever.  HENT: Negative for rhinorrhea, sneezing and sore throat.   Eyes: Negative for visual disturbance.  Respiratory: Negative for cough.   Cardiovascular: Negative for chest pain.  Gastrointestinal: Negative for abdominal pain, constipation, diarrhea, nausea and vomiting.  Genitourinary: Negative for difficulty urinating and hematuria.  Musculoskeletal: Positive for back pain.  Skin: Negative for rash.  Neurological: Positive for tremors. Negative for dizziness and weakness.      Objective:    Physical Exam  Constitutional: He appears well-developed and well-nourished.  BP 138/84   Pulse 77   Temp 98.7 F (37.1 C) (Oral)   Resp 16   Ht 5\' 5"  (1.651 m)   Wt 228 lb (103.4 kg)   SpO2 99%   BMI 37.94 kg/m    HENT:  Head: Normocephalic and atraumatic.  Right Ear: External ear normal.  Left Ear: External ear normal.  Nose: Nose normal.  Eyes: EOM are normal.  Neck: Normal range of motion. Neck supple.  Cardiovascular: Normal rate, regular rhythm and normal heart sounds.   Pulmonary/Chest: Effort normal and breath sounds normal.  Neurological: He is alert.  Skin: Skin is warm and dry.  12mm shallow open incision between the gluteal clefts on the right side. No drainage. No erythema.  Psychiatric: He has a normal mood and affect. His behavior is normal.      Assessment & Plan:   1. Boil of buttock Healing well. No further packing. Follow up as needed.  2. Essential tremor Encouraged follow-up with neurology.    Respectfully, Weldon Picking, PA-S

## 2017-05-30 NOTE — Progress Notes (Signed)
Chief Complaint  Patient presents with  . Follow-up    Wound check, boil of buttock  . Other    Patient has forms from [facility] about lab work    History of Present Illness: Patient presents for wound care, following I&D of an abscess of the LEFT buttock on 7/10. He was repacked on 7/13 and then I saw him for wound care and repacking on 05/27/2017.  He continues to improve, notes minimal pain, scant drainage, no fever, chills. Tolerating antibiotics without difficulty. Washing thea rea daily with soap and water and bandaging to collect any drainage. . Allergies  Allergen Reactions  . Lisinopril Cough  . Other Other (See Comments)    BLOOD PRODUCT REFUSAL (patient is a Jehovah's Witness)    Prior to Admission medications   Medication Sig Start Date End Date Taking? Authorizing Provider  Adalimumab (HUMIRA PEN [place]) Inject into the skin.   Yes [provider]  amLODipine (NORVASC) 10 MG tablet Take 1 tablet (10 mg total) by mouth daily. 01/30/17  Yes Delia Chimes A, MD  aspirin EC 81 MG tablet Take 81 mg by mouth daily.   Yes [provider]  B-D TB SYRINGE 1CC/27GX1/2" 27G X 1/2" 1 ML MISC  02/22/17  Yes [provider]  clindamycin (CLEOCIN-T) 1 % lotion Apply topically 2 (two) times daily. To the affected area 05/21/17  Yes Stallings, Zoe A, MD  folic acid (FOLVITE) 1 MG tablet Take 1 mg by mouth daily.   Yes [provider]  gabapentin (NEURONTIN) 100 MG capsule Take 3 capsules (300 mg total) by mouth 3 (three) times daily. 05/08/17  Yes Stallings, Zoe A, MD  hydrochlorothiazide (HYDRODIURIL) 25 MG tablet TAKE 1 TABLET BY MOUTH ONCE DAILY 02/04/17  Yes Mia Winthrop, PA-C  methotrexate 250 MG/10ML injection Inject 0.6 mLs into the skin every 7 (seven) days.  12/09/15  Yes [provider]  primidone (MYSOLINE) 50 MG tablet Take 50 mg by mouth 2 (two) times daily.  04/05/13  Yes Darlyne Russian, MD  ranitidine (ZANTAC) 150 MG tablet  Take 1 tablet (150 mg total) by mouth 2 (two) times daily. 05/06/17  Yes Branden Shallenberger, PA-C  Tamsulosin HCl (FLOMAX) 0.4 MG CAPS Take 0.4 mg by mouth at bedtime.    Yes [provider]  traMADol (ULTRAM) 50 MG tablet Take 1 tablet (50 mg total) by mouth every 8 (eight) hours as needed. 05/24/17  Yes [name], MD  doxycycline (VIBRA-TABS) 100 MG tablet Take 1 tablet (100 mg total) by mouth 2 (two) times daily. Patient not taking: Reported on 05/30/2017 05/21/17   [name], MD    Patient Active Problem List   Diagnosis Date Noted  . Class 2 severe obesity due to excess calories with serious comorbidity and body mass index (BMI) of 39.0 to 39.9 in adult ([place]) 03/07/2017  . ACE-inhibitor cough 03/07/2017  . Influenza-like illness 01/01/2017  . Lumbago 06/21/2016  . Spinal stenosis, lumbar region, with neurogenic claudication 02/15/2015  . Obesity (BMI 30-39.9) 12/07/2013  . Refusal of blood transfusions as patient is Jehovah's Witness 12/07/2013  . Rheumatoid arthritis ([place]) 10/26/2012  . Nonspecific abnormal finding in stool contents 07/28/2012  . Multiple joint pain 06/05/2012  . History of tobacco use-  06/05/2012  . Asthmatic bronchitis 12/15/2011  . BPH (benign prostatic hyperplasia) 12/15/2011  . Essential tremor 12/15/2011  . Essential hypertension 12/15/2011     Physical Exam  Constitutional: He is oriented  to person, place, and time. He appears well-developed and well-nourished. He is active and cooperative. No distress.  BP 138/84   Pulse 77   Temp 98.7 F (37.1 C) (Oral)   Resp 16   Ht 5\' 5"  (1.651 m)   Wt 228 lb (103.4 kg)   SpO2 99%   BMI 37.94 kg/m    Eyes: Conjunctivae are normal.  Pulmonary/Chest: Effort normal.  Genitourinary:     Neurological: He is alert and oriented to person, place, and time.  Psychiatric: He has a normal mood and affect. His speech is normal and behavior is normal.      ASSESSMENT & PLAN:  1. Boil of  buttock Continue local wound care. Complete antibiotic.  2. Essential tremor [facility] with Dr. Nolon Rod. Patient has signed consent for release of records and we will send requested notes and lab results. We can refer to neurology if needed.    Return if symptoms worsen or fail to improve.   Fara Chute, PA-C [facility]

## 2017-05-30 NOTE — Patient Instructions (Addendum)
Keep up the great work! Once the wound closes, you no longer need a bandage on it.    IF you received an x-ray today, you will receive an invoice from [facility]. Please contact [facility] at [phone] with questions or concerns regarding your invoice.   IF you received labwork today, you will receive an invoice from [place]. Please contact [facility] at [phone] with questions or concerns regarding your invoice.   Our billing staff will not be able to assist you with questions regarding bills from these companies.  You will be contacted with the lab results as soon as they are available. The fastest way to get your results is to activate your My Chart account. Instructions are located on the last page of this paperwork. If you have not heard from [place] regarding the results in 2 weeks, please contact this office.

## 2017-05-31 LAB — WOUND CULTURE

## 2017-06-04 ENCOUNTER — Telehealth: Payer: Self-pay

## 2017-06-04 DIAGNOSIS — M5126 Other intervertebral disc displacement, lumbar region: Secondary | ICD-10-CM | POA: Diagnosis not present

## 2017-06-05 NOTE — Telephone Encounter (Signed)
Per chart notes, patient was to return 04/2017 for weight and b/p follow up. Patient given 30 days of medication until he has follow up

## 2017-06-05 NOTE — Telephone Encounter (Signed)
CHELLE PT CALLING FOR A REFILL ON HIS BLOOD PRESSURE MEDICINE HE ONLY HAVE 1 LEFT

## 2017-06-10 ENCOUNTER — Ambulatory Visit (INDEPENDENT_AMBULATORY_CARE_PROVIDER_SITE_OTHER): Payer: Medicare HMO | Admitting: Physician Assistant

## 2017-06-10 ENCOUNTER — Encounter: Payer: Self-pay | Admitting: Physician Assistant

## 2017-06-10 ENCOUNTER — Ambulatory Visit (HOSPITAL_COMMUNITY)
Admission: RE | Admit: 2017-06-10 | Discharge: 2017-06-10 | Disposition: A | Discharge status: Home / Self Care | Payer: Medicare HMO | Source: Ambulatory Visit | Attending: Physician Assistant | Admitting: Physician Assistant

## 2017-06-10 VITALS — BP 149/72 | HR 98 | Temp 98.2°F | Resp 16 | Ht 65.0 in | Wt 229.0 lb

## 2017-06-10 DIAGNOSIS — R221 Localized swelling, mass and lump, neck: Secondary | ICD-10-CM | POA: Insufficient documentation

## 2017-06-10 DIAGNOSIS — D110 Benign neoplasm of parotid gland: Secondary | ICD-10-CM | POA: Insufficient documentation

## 2017-06-10 LAB — POCT CBC
GRANULOCYTE PERCENT: 64.6 % (ref 37–80)
HCT, POC: 40.8 % — AB (ref 43.5–53.7)
Hemoglobin: 13.8 g/dL — AB (ref 14.1–18.1)
Lymph, poc: 0.9 (ref 0.6–3.4)
MCH: 32.7 pg — AB (ref 27–31.2)
MCHC: 33.9 g/dL (ref 31.8–35.4)
MCV: 96.7 fL (ref 80–97)
MID (CBC): 0.3 (ref 0–0.9)
MPV: 7.1 fL (ref 0–99.8)
POC GRANULOCYTE: 2.3 (ref 2–6.9)
POC LYMPH PERCENT: 25.8 %L (ref 10–50)
POC MID %: 9.6 % (ref 0–12)
Platelet Count, POC: 222 10*3/uL (ref 142–424)
RBC: 4.22 M/uL — AB (ref 4.69–6.13)
RDW, POC: 17.5 %
WBC: 3.5 10*3/uL — AB (ref 4.6–10.2)

## 2017-06-10 NOTE — Patient Instructions (Addendum)
Please go directly to [facility] following your appointment for your ultrasound. Your visit will be on the 1st floor at the radiology department.     IF you received an x-ray today, you will receive an invoice from [facility]. Please contact [facility] at [phone] with questions or concerns regarding your invoice.   IF you received labwork today, you will receive an invoice from [place]. Please contact [facility] at [phone] with questions or concerns regarding your invoice.   Our billing staff will not be able to assist you with questions regarding bills from these companies.  You will be contacted with the lab results as soon as they are available. The fastest way to get your results is to activate your My Chart account. Instructions are located on the last page of this paperwork. If you have not heard from [place] regarding the results in 2 weeks, please contact this office.

## 2017-06-10 NOTE — Progress Notes (Signed)
PRIMARY CARE AT [facility] [address], [place] [address] [phone]  Date:  06/10/2017   Name:  Bryan [name]   DOB:  1946-05-20   MRN:  [id number]  PCP:  [name], MD    History of Present Illness:  Bryan [name] is a 71 y.o. male patient who presents to PCP with  Chief Complaint  Patient presents with  . Recurrent Skin Infections    LEFT SIDE OF NECK/JAW AREA. issues started after stopping Adalimumab injection  . Medication Problem    Side effects of Adalimumab. Pt states he is having over half of the side effects and is afraid     Patient states that he has had jaw swelling for the last week.  It not very painful.  No difficulty to swallow.  He can feel the sensation moreso on the right side of his neck.   He notes that he had stopped the humira about 4 weeks ago.  He was having this q 2 weeks for 6 months.   He denies fever, abnormal fatigue, or night sweats. He has no sick contacts.   He has no sore throat, congestion, or cough. He uses a hearing aid in both ears. He does not have any sick contacts.   He notes that it feels like something is crawling on the left side of his neck for 2 weeks.   No ear pain. He takes aspirins, however no other chronic anti-coagulants.   Patient Active Problem List   Diagnosis Date Noted  . Class 2 severe obesity due to excess calories with serious comorbidity and body mass index (BMI) of 39.0 to 39.9 in adult ([place]) 03/07/2017  . ACE-inhibitor cough 03/07/2017  . Influenza-like illness 01/01/2017  . Lumbago 06/21/2016  . Spinal stenosis, lumbar region, with neurogenic claudication 02/15/2015  . Obesity (BMI 30-39.9) 12/07/2013  . Refusal of blood transfusions as patient is Jehovah's Witness 12/07/2013  . Rheumatoid arthritis ([place]) 10/26/2012  . Nonspecific abnormal finding in stool contents 07/28/2012  . Multiple joint pain 06/05/2012  . History of tobacco use-  06/05/2012  . Asthmatic bronchitis 12/15/2011  . BPH (benign  prostatic hyperplasia) 12/15/2011  . Essential tremor 12/15/2011  . Essential hypertension 12/15/2011    Past Medical History:  Diagnosis Date  . Anemia   . Arthritis    hands, knees, cervical area. Back pain. Rheumatoid arthritis- weekly injections.  [name] BPH (benign prostatic hypertrophy)   . Essential tremor   . GERD (gastroesophageal reflux disease)    reports for indigestion he uses mustard   . Hearing deficit    wears hearing aids bilateral  . HTN (hypertension)   . Obesity   . Refusal of blood transfusions as patient is Jehovah's Witness   . Right bundle branch block    history of  . Seizures (H[facility])    AS A CHILD. only esential tremors now.  . Sleep apnea    cpap - settings at 9 per patient   . Ulcer     Past Surgical History:  Procedure Laterality Date  . ANTERIOR CERVICAL DECOMP/DISCECTOMY FUSION N/A 03/30/2013   Procedure: ANTERIOR CERVICAL DECOMPRESSION/DISCECTOMY FUSION 2 LEVELS;  Surgeon: Otilio Connors, MD;  Location: [place] [facility]UR[facility] ORS;  Service: Neurosurgery;  Laterality: N/A;  C4-5 C5-6 Anterior cervical decompression/diskectomy/fusion/Allograft/Plate  . CHOLECYSTECTOMY N/A 12/07/2013   Procedure: LAPAROSCOPIC CHOLECYSTECTOMY WITH INTRAOPERATIVE CHOLANGIOGRAM;  Surgeon: Adin Hector, MD;  Location: [facility] ORS;  Service: General;  Laterality: N/A;  . COLONOSCOPY    .  DECOMPRESSIVE LUMBAR LAMINECTOMY LEVEL 2 N/A 02/15/2015   Procedure: COMPLETE DECOMPRESSIVE LUMBAR LAMINECTOMY L4-L5/ FORAMINOTOMY TO L4 NERVE ROOT AND L5 NERVE ROOT BILATERALLY;  Surgeon: [name], MD;  Location: [facility] ORS;  Service: Orthopedics;  Laterality: N/A;  . EYE SURGERY     right, growth excision  . LUMBAR LAMINECTOMY/DECOMPRESSION MICRODISCECTOMY Left 01/18/2016   Procedure:  DECOMPRESSION L4-L5 MICRODISCECTOMY L4-L5 ON LEFT FOR SPINAL STENOSIS;  Surgeon: [name], MD;  Location: [facility] ORS;  Service: Orthopedics;  Laterality: Left;  . SPINE SURGERY    . TONSILLECTOMY    . VASECTOMY    . WRIST  GANGLION EXCISION Left     Social History  Substance Use Topics  . Smoking status: Former Smoker    Types: Cigarettes    Quit date: 12/14/1972  . Smokeless tobacco: Never Used  . Alcohol use Yes     Comment: occasionally    Family History  Problem Relation Age of Onset  . Colon cancer Maternal Grandfather   . Kidney disease Brother   . Stroke Maternal Grandmother   . Diabetes Paternal Aunt        x 4 aunts    Allergies  Allergen Reactions  . Lisinopril Cough  . Other Other (See Comments)    BLOOD PRODUCT REFUSAL (patient is a Jehovah's Witness)    Medication list has been reviewed and updated.  Current Outpatient Prescriptions on File Prior to Visit  Medication Sig Dispense Refill  . Adalimumab (HUMIRA PEN [place]) Inject into the skin.    [name] amLODipine (NORVASC) 10 MG tablet Take 1 tablet (10 mg total) by mouth daily. 30 tablet 3  . aspirin EC 81 MG tablet Take 81 mg by mouth daily.    . B-D TB SYRINGE 1CC/27GX1/2" 27G X 1/2" 1 ML MISC     . clindamycin (CLEOCIN-T) 1 % lotion Apply topically 2 (two) times daily. To the affected area 60 mL 0  . doxycycline (VIBRA-TABS) 100 MG tablet Take 1 tablet (100 mg total) by mouth 2 (two) times daily. 20 tablet 0  . folic acid (FOLVITE) 1 MG tablet Take 1 mg by mouth daily.    . hydrochlorothiazide (HYDRODIURIL) 25 MG tablet TAKE 1 TABLET BY MOUTH ONCE DAILY 30 tablet 0  . methotrexate 250 MG/10ML injection Inject 0.6 mLs into the skin every 7 (seven) days.     . primidone (MYSOLINE) 50 MG tablet Take 50 mg by mouth 2 (two) times daily.     . ranitidine (ZANTAC) 150 MG tablet Take 1 tablet (150 mg total) by mouth 2 (two) times daily. 60 tablet 0  . Tamsulosin HCl (FLOMAX) 0.4 MG CAPS Take 0.4 mg by mouth at bedtime.     . traMADol (ULTRAM) 50 MG tablet Take 1 tablet (50 mg total) by mouth every 8 (eight) hours as needed. 30 tablet 0  . gabapentin (NEURONTIN) 100 MG capsule Take 3 capsules (300 mg total) by mouth 3 (three) times daily.  (Patient not taking: Reported on 06/10/2017) 90 capsule 0   Current Facility-Administered Medications on File Prior to Visit  Medication Dose Route Frequency Provider Last Rate Last Dose  . betamethasone acetate-betamethasone sodium phosphate (CELESTONE) injection 3 mg  3 mg Intramuscular Once Daylene Katayama M, DPM      . betamethasone acetate-betamethasone sodium phosphate (CELESTONE) injection 3 mg  3 mg Intramuscular Once Daylene Katayama M, DPM      . lidocaine (XYLOCAINE) 2 % (with pres) injection 100 mg  5 mL Intradermal Once Dufur,  Zoe A, MD        ROS ROS otherwise unremarkable unless listed above.  Physical Examination: BP (!) 149/72   Pulse 98   Temp 98.2 F (36.8 C) (Oral)   Resp 16   Ht 5\' 5"  (1.651 m)   Wt 229 lb (103.9 kg)   SpO2 97%   BMI 38.11 kg/m  Ideal Body Weight: Weight in (lb) to have BMI = 25: 149.9  Physical Exam  Constitutional: He is oriented to person, place, and time. He appears well-developed and well-nourished. No distress.  HENT:  Head: Normocephalic and atraumatic.  Right Ear: Tympanic membrane, external ear and ear canal normal.  Left Ear: Tympanic membrane, external ear and ear canal normal.  Mouth/Throat: No oropharyngeal exudate, posterior oropharyngeal edema or posterior oropharyngeal erythema.  No erythema to inside of the oropharynx. No noticeable erythema or signs of ductal obstruction.   Eyes: Pupils are equal, round, and reactive to light. Conjunctivae and EOM are normal.  Neck:  Non-tender mobile nodule just infero-anterior to the ear.  No abnormal drainage spotted.    Cardiovascular: Normal rate.   Pulmonary/Chest: Effort normal. No respiratory distress. He has no decreased breath sounds. He has no wheezes. He has no rhonchi.  Lymphadenopathy:       Head (right side): No submental and no submandibular adenopathy present.       Head (left side): No submental and no submandibular adenopathy present.       Right: No supraclavicular  adenopathy present.       Left: No supraclavicular adenopathy present.  Neurological: He is alert and oriented to person, place, and time.  Skin: Skin is warm and dry. He is not diaphoretic.  Psychiatric: He has a normal mood and affect. His behavior is normal.   US Soft Tissue Head/neck  Result Date: 06/10/2017 CLINICAL DATA:  71 year old male with a history of lump inferior to left ear EXAM: ULTRASOUND OF HEAD/NECK SOFT TISSUES TECHNIQUE: Ultrasound examination of the head and neck soft tissues was performed in the area of clinical concern. COMPARISON:  None. FINDINGS: Grayscale and color duplex ultrasound performed in the region of clinical concern. There is a heterogeneously hypoechoic lesion within salivary gland tissue, likely parotid gland given the location described, with ill-defined margin measuring 2.0 cm x 0.9 cm x 1.5 cm. No significant internal flow. IMPRESSION: Soft tissue nodule centered in what appears to be the parotid gland measures as large as 2.0 cm. While most primary lesions in the larger salivary glands such as parotid are benign, ultrasound characteristics are nonspecific, and both benign and malignant lesions are on the differential. Referral for ENT evaluation may be considered, as well as contrast-enhanced CT neck. Electronically Signed   By: Corrie Mckusick D.O.   On: 06/10/2017 17:11   Results for orders placed or performed in visit on 06/10/17  POCT CBC  Result Value Ref Range   WBC 3.5 (A) 4.6 - 10.2 K/uL   Lymph, poc 0.9 0.6 - 3.4   POC LYMPH PERCENT 25.8 10 - 50 %L   MID (cbc) 0.3 0 - 0.9   POC MID % 9.6 0 - 12 %M   POC Granulocyte 2.3 2 - 6.9   Granulocyte percent 64.6 37 - 80 %G   RBC 4.22 (A) 4.69 - 6.13 M/uL   Hemoglobin 13.8 (A) 14.1 - 18.1 g/dL   HCT, POC 40.8 (A) 43.5 - 53.7 %   MCV 96.7 80 - 97 fL   MCH, POC 32.7 (A)  27 - 31.2 pg   MCHC 33.9 31.8 - 35.4 g/dL   RDW, POC 17.5 %   Platelet Count, POC 222 142 - 424 K/uL   MPV 7.1 0 - 99.8 fL     Assessment and Plan: Bryan [name] is a 71 y.o. male who is here today for swollen side of neck.  --US performed today.    --will refer to ent.  Advised tylenol at this time.  This is likely not mumps.  Possible ductal obstruction.  Cbc normal.  Mass present on one side of neck - Plan: POCT CBC, US Soft Tissue Head/Neck  Ivar Drape, PA-C [facility] 7/31/20183:07 PM

## 2017-06-11 NOTE — Telephone Encounter (Signed)
Faxed

## 2017-06-11 NOTE — Telephone Encounter (Signed)
Please send notes from his last visit regarding mass to ear, nose and throat via fax. [phone]

## 2017-06-12 DIAGNOSIS — M5126 Other intervertebral disc displacement, lumbar region: Secondary | ICD-10-CM | POA: Diagnosis not present

## 2017-06-25 ENCOUNTER — Other Ambulatory Visit: Payer: Self-pay | Admitting: Family Medicine

## 2017-06-25 DIAGNOSIS — Z0100 Encounter for examination of eyes and vision without abnormal findings: Secondary | ICD-10-CM | POA: Diagnosis not present

## 2017-06-25 DIAGNOSIS — I10 Essential (primary) hypertension: Secondary | ICD-10-CM | POA: Diagnosis not present

## 2017-06-26 DIAGNOSIS — I7300 Raynaud's syndrome without gangrene: Secondary | ICD-10-CM | POA: Diagnosis not present

## 2017-06-26 DIAGNOSIS — E669 Obesity, unspecified: Secondary | ICD-10-CM | POA: Diagnosis not present

## 2017-06-26 DIAGNOSIS — Z6838 Body mass index (BMI) 38.0-38.9, adult: Secondary | ICD-10-CM | POA: Diagnosis not present

## 2017-06-26 DIAGNOSIS — M0579 Rheumatoid arthritis with rheumatoid factor of multiple sites without organ or systems involvement: Secondary | ICD-10-CM | POA: Diagnosis not present

## 2017-06-26 DIAGNOSIS — M5416 Radiculopathy, lumbar region: Secondary | ICD-10-CM | POA: Diagnosis not present

## 2017-06-26 DIAGNOSIS — M2550 Pain in unspecified joint: Secondary | ICD-10-CM | POA: Diagnosis not present

## 2017-06-26 NOTE — Telephone Encounter (Signed)
Tramadol phone in to pharmacy

## 2017-06-26 NOTE — Telephone Encounter (Signed)
Please phone in tramadol. If it cannot be phoned in please ask another provider to print and sign his prescription for me.  Thank you.

## 2017-06-26 NOTE — Telephone Encounter (Signed)
Please phone in the tramadol to the pharmacy

## 2017-07-01 ENCOUNTER — Other Ambulatory Visit: Payer: Self-pay | Admitting: Family Medicine

## 2017-07-24 ENCOUNTER — Ambulatory Visit: Payer: Medicare HMO | Admitting: Physician Assistant

## 2017-08-12 ENCOUNTER — Encounter: Payer: Self-pay | Admitting: Family Medicine

## 2017-08-12 ENCOUNTER — Ambulatory Visit (INDEPENDENT_AMBULATORY_CARE_PROVIDER_SITE_OTHER): Payer: Medicare HMO | Admitting: Family Medicine

## 2017-08-12 ENCOUNTER — Other Ambulatory Visit: Payer: Self-pay | Admitting: Family Medicine

## 2017-08-12 VITALS — BP 142/74 | HR 93 | Temp 98.1°F | Resp 17 | Ht 65.0 in | Wt 227.8 lb

## 2017-08-12 DIAGNOSIS — G250 Essential tremor: Secondary | ICD-10-CM

## 2017-08-12 DIAGNOSIS — I10 Essential (primary) hypertension: Secondary | ICD-10-CM | POA: Diagnosis not present

## 2017-08-12 DIAGNOSIS — Z23 Encounter for immunization: Secondary | ICD-10-CM | POA: Diagnosis not present

## 2017-08-12 DIAGNOSIS — J9801 Acute bronchospasm: Secondary | ICD-10-CM | POA: Diagnosis not present

## 2017-08-12 DIAGNOSIS — M129 Arthropathy, unspecified: Secondary | ICD-10-CM | POA: Diagnosis not present

## 2017-08-12 DIAGNOSIS — Z7189 Other specified counseling: Secondary | ICD-10-CM

## 2017-08-12 MED ORDER — ALBUTEROL SULFATE HFA 108 (90 BASE) MCG/ACT IN AERS
2.0000 | INHALATION_SPRAY | Freq: Four times a day (QID) | RESPIRATORY_TRACT | 0 refills | Status: DC | PRN
Start: 2017-08-12 — End: 2017-09-27

## 2017-08-12 NOTE — Patient Instructions (Addendum)
   For nerve pain (burning pain in the joints with burning shooting pains or numbness) Gabapentin  For heart burn Take ranitidine as needed  For irregular heart beats Take metoprolol as instructed  For Tremors Continue Primidone  For Hypertension Continue amlodipine and hydrochlorothiazide  For Rheumatoid Arthritis Continue methotrexate Also continue Folic Acid Continue Humera  To prevent strokes and heart attack Continue aspirin   IF you received an x-ray today, you will receive an invoice from [facility]. Please contact [facility] at [phone] with questions or concerns regarding your invoice.   IF you received labwork today, you will receive an invoice from [place]. Please contact [facility] at [phone] with questions or concerns regarding your invoice.   Our billing staff will not be able to assist you with questions regarding bills from these companies.  You will be contacted with the lab results as soon as they are available. The fastest way to get your results is to activate your My Chart account. Instructions are located on the last page of this paperwork. If you have not heard from [place] regarding the results in 2 weeks, please contact this office.

## 2017-08-12 NOTE — Progress Notes (Signed)
Chief Complaint  Patient presents with  . Arthritis    all over x 2 weeks, seen at [facility] and given meds, pt wants to make sure he isn't taking to much medication.  Pain level 10+/10    HPI  Arthritis and med reconciliation Pt reports that he was at the [facility] for his pain with the primary care doctor there He states that he has arthritis in his hands, shoulder, knees, neck and back He is on humera, methotrexate and gabapentin as well as norco Tramadol was not working His pain is greater than 10/10 and is pretty constant He can only stand for 2 minutes without severe pain He states that he is confused as to what all his meds are so he stopped taking some of the meds  He is not taking amlodipine, metoprolol or gabapentin currently He also stopped his folic acid  He has an appointment with Rheumatology at the [facility] in November 2018  Shortness of breath and palpitations He continues to have heart palpitations and irregular heart beat but no diaphoresis or dizziness He states that he has been getting shortness of breath.   Past Medical History:  Diagnosis Date  . Anemia   . Arthritis    hands, knees, cervical area. Back pain. Rheumatoid arthritis- weekly injections.  [name] BPH (benign prostatic hypertrophy)   . Essential tremor   . GERD (gastroesophageal reflux disease)    reports for indigestion he uses mustard   . Hearing deficit    wears hearing aids bilateral  . HTN (hypertension)   . Obesity   . Refusal of blood transfusions as patient is Jehovah's Witness   . Right bundle branch block    history of  . Seizures (HCC)    AS A CHILD. only esential tremors now.  . Sleep apnea    cpap - settings at 9 per patient   . Ulcer     Current Outpatient Prescriptions  Medication Sig Dispense Refill  . Adalimumab (HUMIRA PEN [place]) Inject into the skin.    [name] aspirin EC 81 MG tablet Take 81 mg by mouth daily.    . B-D TB SYRINGE 1CC/27GX1/2" 27G X 1/2" 1 ML MISC     . folic acid (FOLVITE) 1  MG tablet Take 1 mg by mouth daily.    [name] gabapentin (NEURONTIN) 100 MG capsule Take 3 capsules (300 mg total) by mouth 3 (three) times daily. 90 capsule 0  . hydrochlorothiazide (HYDRODIURIL) 25 MG tablet TAKE 1 TABLET BY MOUTH ONCE DAILY 30 tablet 6  . methotrexate 250 MG/10ML injection Inject 0.6 mLs into the skin every 7 (seven) days.     . metoprolol succinate (TOPROL-XL) 50 MG 24 hr tablet TAKE 1 TABLET BY MOUTH ONCE DAILY. TAKE WITH OR IMMEDIATELY FOLLOWING A MEAL 90 tablet 3  . primidone (MYSOLINE) 50 MG tablet Take 50 mg by mouth 2 (two) times daily.     . ranitidine (ZANTAC) 150 MG tablet Take 1 tablet (150 mg total) by mouth 2 (two) times daily. 60 tablet 0  . Tamsulosin HCl (FLOMAX) 0.4 MG CAPS Take 0.4 mg by mouth at bedtime.     [name] albuterol (PROVENTIL HFA;VENTOLIN HFA) 108 (90 Base) MCG/ACT inhaler Inhale 2 puffs into the lungs every 6 (six) hours as needed for wheezing or shortness of breath. 1 Inhaler 0   Current Facility-Administered Medications  Medication Dose Route Frequency Provider Last Rate Last Dose  . betamethasone acetate-betamethasone sodium phosphate (CELESTONE) injection 3 mg  3 mg  Intramuscular Once Daylene Katayama M, DPM      . betamethasone acetate-betamethasone sodium phosphate (CELESTONE) injection 3 mg  3 mg Intramuscular Once Evans, Ruby Cola M, DPM      . lidocaine (XYLOCAINE) 2 % (with pres) injection 100 mg  5 mL Intradermal Once [name], MD        Allergies:  Allergies  Allergen Reactions  . Lisinopril Cough  . Other Other (See Comments)    BLOOD PRODUCT REFUSAL (patient is a Jehovah's Witness)    Past Surgical History:  Procedure Laterality Date  . ANTERIOR CERVICAL DECOMP/DISCECTOMY FUSION N/A 03/30/2013   Procedure: ANTERIOR CERVICAL DECOMPRESSION/DISCECTOMY FUSION 2 LEVELS;  Surgeon: Otilio Connors, MD;  Location: [place];  Service: Neurosurgery;  Laterality: N/A;  C4-5 C5-6 Anterior cervical decompression/diskectomy/fusion/Allograft/Plate    . CHOLECYSTECTOMY N/A 12/07/2013   Procedure: LAPAROSCOPIC CHOLECYSTECTOMY WITH INTRAOPERATIVE CHOLANGIOGRAM;  Surgeon: Adin Hector, MD;  Location: [facility];  Service: General;  Laterality: N/A;  . COLONOSCOPY    . DECOMPRESSIVE LUMBAR LAMINECTOMY LEVEL 2 N/A 02/15/2015   Procedure: COMPLETE DECOMPRESSIVE LUMBAR LAMINECTOMY L4-L5/ FORAMINOTOMY TO L4 NERVE ROOT AND L5 NERVE ROOT BILATERALLY;  Surgeon: [name], MD;  Location: [facility];  Service: Orthopedics;  Laterality: N/A;  . EYE SURGERY     right, growth excision  . LUMBAR LAMINECTOMY/DECOMPRESSION MICRODISCECTOMY Left 01/18/2016   Procedure:  DECOMPRESSION L4-L5 MICRODISCECTOMY L4-L5 ON LEFT FOR SPINAL STENOSIS;  Surgeon: [name], MD;  Location: [facility];  Service: Orthopedics;  Laterality: Left;  . SPINE SURGERY    . TONSILLECTOMY    . VASECTOMY    . WRIST GANGLION EXCISION Left     Social History   Social History  . Marital status: Married    Spouse name: N/A  . Number of children: 2  . Years of education: N/A   Occupational History  . bus driver Retired   Social History Main Topics  . Smoking status: Former Smoker    Types: Cigarettes    Quit date: 12/14/1972  . Smokeless tobacco: Never Used  . Alcohol use Yes     Comment: occasionally  . Drug use: No  . Sexual activity: Yes    Birth control/ protection: None   Other Topics Concern  . None   Social History Narrative  . None    [name] See HPI Constitution: No fevers or chills No malaise No diaphoresis Skin: No rash or itching Eyes: no blurry vision, no double vision GU: no dysuria or hematuria Neuro: no dizziness today or headaches Musculoskeletal: chronic joint pain  Objective: Vitals:   08/12/17 1434  BP: (!) 142/74  Pulse: 93  Resp: 17  Temp: 98.1 F (36.7 C)  TempSrc: Oral  SpO2: 96%  Weight: 227 lb 12.8 oz (103.3 kg)  Height: 5\' 5"  (1.651 m)   Wt Readings from Last 3 Encounters:  08/12/17 227 lb 12.8 oz (103.3 kg)   06/10/17 229 lb (103.9 kg)  05/30/17 228 lb (103.4 kg)     Physical Exam  Constitutional: He is oriented to person, place, and time. He appears well-developed and well-nourished.  HENT:  Head: Normocephalic and atraumatic.  Eyes: Conjunctivae and EOM are normal.  Neck: Normal range of motion. No thyromegaly present.  Cardiovascular: S1 normal and normal heart sounds.  An irregular rhythm present.  No murmur heard. Pulmonary/Chest: Effort normal and breath sounds normal. No respiratory distress. He has no wheezes.  Neurological: He is alert and oriented to person, place,  and time.  Skin: Skin is warm. Capillary refill takes less than 2 seconds.  Psychiatric: He has a normal mood and affect. His behavior is normal. Judgment and thought content normal.      Lab Results  Component Value Date   CREATININE 1.12 03/31/2016    Assessment and Plan Bryan [name] was seen today for arthritis.  Diagnoses and all orders for this visit:  Encounter for medication review and counseling -  Reviewed all the medications and wrote out by topic what each medication is for -  Reviewed labs and discussed why the medications were started and discussed plan for continued monitoring  Essential tremor- stable, cpm  Essential hypertension- stable, cpm  Arthritis involving multiple sites- will see Rheumatology at the [facility] in November 2018  Other orders -     Flu Vaccine QUAD 36+ mos IM -     Tdap vaccine greater than or equal to 7yo IM  A total of 25 minutes were spent face-to-face with the patient during this encounter and over half of that time was spent on counseling and coordination of care.   [place]

## 2017-08-13 ENCOUNTER — Telehealth: Payer: Self-pay

## 2017-08-13 NOTE — Telephone Encounter (Signed)
Please disregard previous message.  Chart documentation is 3 pills 3 times daily for a total of 90 pills.  This would only be for 10 days supply.  Am sending a message to Dr. Nolon Rod for clarification as to amount to be dispensed, 10 days or 30 days.

## 2017-08-13 NOTE — Telephone Encounter (Signed)
Began PA for Gabapentin via cover my meds.  Key code is [place]. Should have result within 5 business days.

## 2017-08-13 NOTE — Telephone Encounter (Signed)
Insurance company called and stated that pharmacy is running medication for 90 for 10.  Deneise Lever and I consulted patient's medlist and saw that it is indeed 68 for 30.  They said they would approve 8 for 30. Pharmacy notified.

## 2017-08-14 MED ORDER — GABAPENTIN 300 MG PO CAPS: 300.0000 mg | ORAL_CAPSULE | Freq: Three times a day (TID) | ORAL | 3 refills | Status: DC

## 2017-08-14 NOTE — Telephone Encounter (Signed)
Called pharmacy for a test run of medication with new sig and dosage.  It did go through without any PA needed.  They will contact patient.

## 2017-08-14 NOTE — Telephone Encounter (Signed)
Changed to a high dose so that pt can take one capsule tid for 30 days with refills.

## 2017-08-24 ENCOUNTER — Encounter: Payer: Self-pay | Admitting: Physician Assistant

## 2017-08-24 ENCOUNTER — Ambulatory Visit (INDEPENDENT_AMBULATORY_CARE_PROVIDER_SITE_OTHER): Payer: Medicare HMO | Admitting: Physician Assistant

## 2017-08-24 ENCOUNTER — Ambulatory Visit (INDEPENDENT_AMBULATORY_CARE_PROVIDER_SITE_OTHER): Payer: Medicare HMO

## 2017-08-24 VITALS — HR 82 | Temp 97.9°F | Resp 18 | Ht 64.57 in | Wt 228.4 lb

## 2017-08-24 DIAGNOSIS — M542 Cervicalgia: Secondary | ICD-10-CM

## 2017-08-24 DIAGNOSIS — M25569 Pain in unspecified knee: Secondary | ICD-10-CM | POA: Diagnosis not present

## 2017-08-24 DIAGNOSIS — M25561 Pain in right knee: Secondary | ICD-10-CM | POA: Diagnosis not present

## 2017-08-24 MED ORDER — ACETAMINOPHEN 500 MG PO TABS: 1000.0000 mg | ORAL_TABLET | Freq: Three times a day (TID) | ORAL | 99 refills | Status: DC | PRN

## 2017-08-24 MED ORDER — PREDNISONE 50 MG PO TABS: ORAL_TABLET | ORAL | 0 refills | Status: DC

## 2017-08-24 MED ORDER — CYCLOBENZAPRINE HCL 5 MG PO TABS: 2.5000 mg | ORAL_TABLET | Freq: Three times a day (TID) | ORAL | 1 refills | Status: DC | PRN

## 2017-08-24 NOTE — Progress Notes (Signed)
08/25/2017 9:36 AM   DOB: Jul 05, 1946 / MRN: [id number]  SUBJECTIVE:  Bryan [name] is a 71 y.o. male presenting for pain in his right neck, right shoulder, and right knee pain that started about 1 month ago.  Did have a fall on September 4th and states he "missed the last step." Did not have any immediate pain after the incident.  Has been taking hydrocodone for the pain and "that does not really work."  Has also tried various creams in including diclofenac. Was seen at the [facility] for this but no xrays were taken. Has a history of neck surgery. The pain in his right shoulder is a stab and it gets better with raising his arm over his head. Describes the knee pain as worse with getting up and feels like a needle.   He is allergic to lisinopril and other.   He  has a past medical history of Anemia; Arthritis; BPH (benign prostatic hypertrophy); Essential tremor; GERD (gastroesophageal reflux disease); Hearing deficit; HTN (hypertension); Obesity; Refusal of blood transfusions as patient is Jehovah's Witness; Right bundle branch block; Seizures ([place]); Sleep apnea; and Ulcer.    He  reports that he quit smoking about 44 years ago. His smoking use included Cigarettes. He has never used smokeless tobacco. He reports that he drinks alcohol. He reports that he does not use drugs. He  reports that he currently engages in sexual activity. He reports using the following method of birth control/protection: None. The patient  has a past surgical history that includes Tonsillectomy; Eye surgery; Colonoscopy; Vasectomy; Anterior cervical decomp/discectomy fusion (N/A, 03/30/2013); Spine surgery; Wrist ganglion excision (Left); Cholecystectomy (N/A, 12/07/2013); Decompressive lumbar laminectomy level 2 (N/A, 02/15/2015); and Lumbar laminectomy/decompression microdiscectomy (Left, 01/18/2016).  His family history includes Colon cancer in his maternal grandfather; Diabetes in his paternal aunt; Kidney disease in his brother;  Stroke in his maternal grandmother.  Review of Systems  Constitutional: Negative for chills, diaphoresis and fever.  Respiratory: Negative for shortness of breath.   Cardiovascular: Negative for chest pain, orthopnea and leg swelling.  Gastrointestinal: Negative for nausea.  Musculoskeletal: Positive for joint pain and neck pain. Negative for back pain, falls and myalgias.  Skin: Negative for rash.  Neurological: Negative for dizziness.    The problem list and medications were reviewed and updated by myself where necessary and exist elsewhere in the encounter.   OBJECTIVE:  Pulse 82   Temp 97.9 F (36.6 C) (Oral)   Resp 18   Ht 5' 4.57" (1.64 m)   Wt 228 lb 6.4 oz (103.6 kg)   SpO2 97%   BMI 38.52 kg/m   Physical Exam  Constitutional: He is oriented to person, place, and time. He appears well-developed. He is active and cooperative.  Non-toxic appearance.  Cardiovascular: Normal rate, regular rhythm, S1 normal, S2 normal, normal heart sounds, intact distal pulses and normal pulses.  Exam reveals no gallop and no friction rub.   No murmur heard. Pulmonary/Chest: Effort normal. No stridor. No tachypnea. No respiratory distress. He has no wheezes. He has no rales.  Abdominal: He exhibits no distension.  Musculoskeletal: He exhibits tenderness (generalized joint line tenderness about the right knee.  No significant swelling.  Ligmanets intact.  ROM limited passively due to pain. ). He exhibits no edema.  Neurological: He is alert and oriented to person, place, and time. He has normal reflexes. He displays normal reflexes. No cranial nerve deficit. He exhibits normal muscle tone. Coordination normal.  Negative spurlings.  Skin: Skin is warm and dry. He is not diaphoretic. No pallor.  Vitals reviewed.     No results found for this or any previous visit (from the past 72 hour(s)).  Dg Cervical Spine Complete  Result Date: 08/24/2017 CLINICAL DATA:  Neck pain. EXAM: CERVICAL  SPINE - COMPLETE 4+ VIEW COMPARISON:  CT scan of September 05, 2015. FINDINGS: Status post surgical anterior fusion of C4-5 and C5-6. No fracture or spondylolisthesis is noted. No prevertebral soft tissue swelling is noted. Moderate degenerative disc disease is noted at C6-7 with anterior osteophyte formation. Moderate bilateral neural foraminal stenosis is noted C3-4, C4-5, C5-6 and C6-7 secondary to uncovertebral spurring. Degenerative change of the posterior facet joints is noted bilaterally. IMPRESSION: Status post surgical posterior fusion of C3-4 and C5-6. Moderate degenerative disc disease is noted at C6-7. Moderate bilateral neural foraminal stenosis is noted secondary to uncovertebral spurring. No acute abnormality seen in the cervical spine. Electronically Signed   By: Marijo Conception, M.D.   On: 08/24/2017 13:54   Dg Knee Complete 4 Views Right  Result Date: 08/24/2017 CLINICAL DATA:  Right knee pain after fall last month. Medial knee tenderness. EXAM: RIGHT KNEE - COMPLETE 4+ VIEW COMPARISON:  01/19/2013 FINDINGS: No fracture or dislocation. Moderate tricompartmental degenerative change of the knee, worse within the medial compartment and patellofemoral joint with joint space loss, subchondral sclerosis and osteophytosis. There is minimal spurring of the tibial spines. No evidence chondrocalcinosis. No joint effusion. Minimal enthesopathic change involving the superior pole of the patella as well as the tibial tuberosity. IMPRESSION: 1. No acute findings. 2. Moderate tricompartmental degenerative change of the knee, progressed compared to the 01/2013 examination. Electronically Signed   By: Sandi Mariscal M.D.   On: 08/24/2017 13:52   Procedure: Risk and benefits discussed and verbal consent obtained.  Betadine prep of the right knee times three. Skin overlying the lateral suprapatellar bursa anesthetized using 1:1 mix of 2% lidocaine without and bupivacaine.  Suprapatellar bursa accessed with a 20  gauge needle using a lateral approach.  1:3 mixture of Triamcinolone and bupivacaine injected into the bursae.  There was minimal to no resistance to pushing in the medication. Needle extracted and bandage placed.  Patient with immediate relief of pain with ambulation.   ASSESSMENT AND PLAN:  Ollis was seen today for pain.  Diagnoses and all orders for this visit:  Neck pain: radicular pattern to shoulder, arm and hand.  No previous evidence of diabetes.  Will run an A1c.  He will start pred tomorrow morning and hopefully this radicular pain that has been present for over one month will relent.  Orthor referral out in the event that he does not improve.  -     Hemoglobin A1c -     DG Cervical Spine Complete; Future -     predniSONE (DELTASONE) 50 MG tablet; Take one tablet daily in the morning with breatkfast for neck pain. -     cyclobenzaprine (FLEXERIL) 5 MG tablet; Take 0.5-1 tablets (2.5-5 mg total) by mouth 3 (three) times daily as needed for muscle spasms. -     acetaminophen (TYLENOL) 500 MG tablet; Take 2 tablets (1,000 mg total) by mouth every 8 (eight) hours as needed for mild pain, moderate pain or fever. Take 2 tabs every 8 hours. -     Ambulatory referral to Orthopedic Surgery  Knee tenderness: Worseing OA per rads.  Exam consistent with that diagnosis.  Knee injected with immediate relief in pain.   -  Hemoglobin A1c -     DG Knee Complete 4 Views Right; Future    The patient is advised to call or return to clinic if he does not see an improvement in symptoms, or to seek the care of the closest emergency department if he worsens with the above plan.   [name], MHS, PA-C [facility] 08/25/2017 9:36 AM

## 2017-08-24 NOTE — Patient Instructions (Addendum)
Do not take any other medications for pain other than what I am giving you.  Start the prednisone tomorrow morning.     IF you received an x-ray today, you will receive an invoice from [facility]. Please contact [facility] at [phone] with questions or concerns regarding your invoice.   IF you received labwork today, you will receive an invoice from [facility]. Please contact [facility] at [phone] with questions or concerns regarding your invoice.   Our billing staff will not be able to assist you with questions regarding bills from these companies.  You will be contacted with the lab results as soon as they are available. The fastest way to get your results is to activate your My Chart account. Instructions are located on the last page of this paperwork. If you have not heard from [place] regarding the results in 2 weeks, please contact this office.

## 2017-08-25 LAB — HEMOGLOBIN A1C
Est. average glucose Bld gHb Est-mCnc: 108 mg/dL
Hgb A1c MFr Bld: 5.4 % (ref 4.8–5.6)

## 2017-09-06 ENCOUNTER — Ambulatory Visit (INDEPENDENT_AMBULATORY_CARE_PROVIDER_SITE_OTHER): Payer: Medicare HMO | Admitting: Physician Assistant

## 2017-09-06 ENCOUNTER — Encounter: Payer: Self-pay | Admitting: Physician Assistant

## 2017-09-06 VITALS — BP 142/88 | HR 88 | Temp 98.2°F | Resp 16 | Ht 64.75 in | Wt 230.4 lb

## 2017-09-06 DIAGNOSIS — R05 Cough: Secondary | ICD-10-CM

## 2017-09-06 DIAGNOSIS — G8929 Other chronic pain: Secondary | ICD-10-CM | POA: Diagnosis not present

## 2017-09-06 DIAGNOSIS — R059 Cough, unspecified: Secondary | ICD-10-CM

## 2017-09-06 DIAGNOSIS — R062 Wheezing: Secondary | ICD-10-CM

## 2017-09-06 DIAGNOSIS — M542 Cervicalgia: Secondary | ICD-10-CM

## 2017-09-06 MED ORDER — PREDNISONE 20 MG PO TABS: ORAL_TABLET | ORAL | 0 refills | Status: AC

## 2017-09-06 MED ORDER — HYDROCODONE-HOMATROPINE 5-1.5 MG/5ML PO SYRP: 2.5000 mL | ORAL_SOLUTION | Freq: Every evening | ORAL | 0 refills | Status: DC | PRN

## 2017-09-06 MED ORDER — ALBUTEROL SULFATE (2.5 MG/3ML) 0.083% IN NEBU
2.5000 mg | INHALATION_SOLUTION | Freq: Once | RESPIRATORY_TRACT | Status: AC
  Administered 2017-09-06: 2.5 mg via RESPIRATORY_TRACT

## 2017-09-06 MED ORDER — PHENYLEPH-DEXCHLORPHEN-CODEINE 5-1-10 MG/5ML PO LIQD: ORAL | 0 refills | Status: DC

## 2017-09-06 MED ORDER — IPRATROPIUM BROMIDE 0.02 % IN SOLN
0.5000 mg | Freq: Once | RESPIRATORY_TRACT | Status: AC
  Administered 2017-09-06: 0.5 mg via RESPIRATORY_TRACT

## 2017-09-06 NOTE — Progress Notes (Signed)
09/07/2017 10:59 AM   DOB: 11-14-1945 / MRN: [id number]  SUBJECTIVE:  Bryan [name] is a 71 y.o. male with a history of smoking presenting for cough and nasal congestion that started yesterday.  Feels that he is getting. He has a hsitory of asthma on his problem list.    Tells me that the prednisone worked "like a miracle drug," for his neck and knee pain.  Once he stopped the medication he noticed the pain starting to return. Notes today that his pain is 5/10 and tolerable.  When I last saw him he says he felt as if someone was stabbing him in the right neck and he was actually losing sleep.   He is allergic to lisinopril and other.   He  has a past medical history of Anemia; Arthritis; BPH (benign prostatic hypertrophy); Essential tremor; GERD (gastroesophageal reflux disease); Hearing deficit; HTN (hypertension); Obesity; Refusal of blood transfusions as patient is Jehovah's Witness; Right bundle branch block; Seizures ([place]); Sleep apnea; and Ulcer.    He  reports that he quit smoking about 44 years ago. His smoking use included Cigarettes. He has never used smokeless tobacco. He reports that he drinks alcohol. He reports that he does not use drugs. He  reports that he currently engages in sexual activity. He reports using the following method of birth control/protection: None. The patient  has a past surgical history that includes Tonsillectomy; Eye surgery; Colonoscopy; Vasectomy; Anterior cervical decomp/discectomy fusion (N/A, 03/30/2013); Spine surgery; Wrist ganglion excision (Left); Cholecystectomy (N/A, 12/07/2013); Decompressive lumbar laminectomy level 2 (N/A, 02/15/2015); and Lumbar laminectomy/decompression microdiscectomy (Left, 01/18/2016).  His family history includes Colon cancer in his maternal grandfather; Diabetes in his paternal aunt; Kidney disease in his brother; Stroke in his maternal grandmother.  Review of Systems  Constitutional: Negative for chills, diaphoresis and  fever.  Respiratory: Positive for cough and sputum production. Negative for hemoptysis, shortness of breath and wheezing.   Cardiovascular: Negative for chest pain, orthopnea and leg swelling.  Gastrointestinal: Negative for nausea.  Skin: Negative for rash.  Neurological: Negative for dizziness.    The problem list and medications were reviewed and updated by myself where necessary and exist elsewhere in the encounter.   OBJECTIVE:  BP (!) 142/88 (BP Location: Left Arm, Patient Position: Sitting, Cuff Size: Large)   Pulse 88   Temp 98.2 F (36.8 C) (Oral)   Resp 16   Ht 5' 4.75" (1.645 m)   Wt 230 lb 6.4 oz (104.5 kg)   SpO2 96%   BMI 38.64 kg/m   Physical Exam  Constitutional: He appears well-developed. He is active and cooperative.  Non-toxic appearance.  Cardiovascular: Normal rate, regular rhythm, S1 normal, S2 normal, normal heart sounds, intact distal pulses and normal pulses.  Exam reveals no gallop and no friction rub.   No murmur heard. Pulmonary/Chest: Effort normal. No stridor. No tachypnea. No respiratory distress. He has no wheezes. He has no rales.  Abdominal: He exhibits no distension.  Musculoskeletal: He exhibits no edema.  Neurological: He is alert.  Skin: Skin is warm and dry. He is not diaphoretic. No pallor.  Vitals reviewed.   No results found for this or any previous visit (from the past 72 hour(s)).  No results found.  ASSESSMENT AND PLAN:  Oree was seen today for uri.  Diagnoses and all orders for this visit:  Cough: Likely viral. Time, tylenol and cough syrup as needed.  -     HYDROcodone-homatropine (HYCODAN) 5-1.5 MG/5ML syrup;  Take 2.5-5 mLs by mouth at bedtime as needed.  Wheezing: Resolved with nebs in the office.  Advised that he schedule hisinhlaer 1-2 puff q8 hours.  -     albuterol (PROVENTIL) (2.5 MG/3ML) 0.083% nebulizer solution 2.5 mg; Take 3 mLs (2.5 mg total) by nebulization once. -     ipratropium (ATROVENT) nebulizer  solution 0.5 mg; Take 2.5 mLs (0.5 mg total) by nebulization once.  Chronic neck pain: He will fill only once his pain reaches an 8/10.  He does have an ortho referral pending through the [place] and hopefully he will be a candidate for cervical spine injections.  -     predniSONE (DELTASONE) 20 MG tablet; Take 3 in the morning for 3 days, then 2 in the morning for 3 days, and then 1 in the morning for 3 days.  Other orders -     Discontinue: Phenyleph-Dexchlorphen-Codeine 03-12-09 MG/5ML LIQD; Take 5 mL every 12 hours as needed for cough.    The patient is advised to call or return to clinic if he does not see an improvement in symptoms, or to seek the care of the closest emergency department if he worsens with the above plan.   [name], MHS, PA-C [facility] 09/07/2017 10:59 AM

## 2017-09-27 ENCOUNTER — Other Ambulatory Visit: Payer: Self-pay

## 2017-09-27 ENCOUNTER — Encounter: Payer: Self-pay | Admitting: Physician Assistant

## 2017-09-27 ENCOUNTER — Ambulatory Visit (INDEPENDENT_AMBULATORY_CARE_PROVIDER_SITE_OTHER): Payer: Medicare HMO | Admitting: Physician Assistant

## 2017-09-27 VITALS — BP 128/74 | HR 82 | Temp 98.8°F | Resp 18 | Ht 64.75 in | Wt 232.6 lb

## 2017-09-27 DIAGNOSIS — M5441 Lumbago with sciatica, right side: Secondary | ICD-10-CM

## 2017-09-27 DIAGNOSIS — R69 Illness, unspecified: Secondary | ICD-10-CM | POA: Diagnosis not present

## 2017-09-27 MED ORDER — TRAMADOL HCL 50 MG PO TABS: 50.0000 mg | ORAL_TABLET | Freq: Three times a day (TID) | ORAL | 0 refills | Status: DC | PRN

## 2017-09-27 NOTE — Patient Instructions (Addendum)
Use the tramadol as needed for the pain. Use ice on the area of pain several times today, then switch to a heating pad tomorrow.    IF you received an x-ray today, you will receive an invoice from [facility]. Please contact [facility] at [phone] with questions or concerns regarding your invoice.   IF you received labwork today, you will receive an invoice from [facility]. Please contact [facility] at [phone] with questions or concerns regarding your invoice.   Our billing staff will not be able to assist you with questions regarding bills from these companies.  You will be contacted with the lab results as soon as they are available. The fastest way to get your results is to activate your My Chart account. Instructions are located on the last page of this paperwork. If you have not heard from [place] regarding the results in 2 weeks, please contact this office.

## 2017-09-27 NOTE — Progress Notes (Signed)
Patient ID: Bryan [name], male    DOB: 12-10-1945, 71 y.o.   MRN: [id number]  PCP: [name], MD  Chief Complaint  Patient presents with  . Back Pain    x1 day, Pt states he walked out of his shed and had on boots and the boots didn't grip like they should have and he went sliding. Pt states his feet went out from under him and his head and back hit first.     Subjective:   Presents for evaluation of back pain, after a fall yesterday. He is accompanied by his wife.  During the rain, he went out to the shed to put the hose away, in anticipation of the freezing temperatures expected last night. Despite wearing snow boots, he slipped on the metal ramp as he exited the shed. His feet flew out in front of him and he landed on his bottom, hard.  Since the fall, he has pain in the RIGHT buttock that extends down the back of the RIGHT leg, but as far as the knee.. No numbness, tingling or increased weakness. No loss of bowel or bladder control. He took some leftover tramadol, which helped, but he needs a refill.   Review of Systems As above.    Patient Active Problem List   Diagnosis Date Noted  . Class 2 severe obesity due to excess calories with serious comorbidity and body mass index (BMI) of 39.0 to 39.9 in adult ([place]) 03/07/2017  . ACE-inhibitor cough 03/07/2017  . Influenza-like illness 01/01/2017  . Lumbago 06/21/2016  . Spinal stenosis, lumbar region, with neurogenic claudication 02/15/2015  . Obesity (BMI 30-39.9) 12/07/2013  . Refusal of blood transfusions as patient is Jehovah's Witness 12/07/2013  . Rheumatoid arthritis ([place]) 10/26/2012  . Nonspecific abnormal finding in stool contents 07/28/2012  . Multiple joint pain 06/05/2012  . History of tobacco use-  06/05/2012  . Asthmatic bronchitis 12/15/2011  . BPH (benign prostatic hyperplasia) 12/15/2011  . Essential tremor 12/15/2011  . Essential hypertension 12/15/2011    Prior to Admission medications     Medication Sig Start Date End Date Taking? Authorizing Provider  acetaminophen (TYLENOL) 500 MG tablet Take 2 tablets (1,000 mg total) by mouth every 8 (eight) hours as needed for mild pain, moderate pain or fever. Take 2 tabs every 8 hours. 08/24/17 08/24/18 Yes Tereasa Coop, PA-C  Adalimumab (HUMIRA PEN [place]) Inject into the skin.   Yes [provider]  aspirin EC 81 MG tablet Take 81 mg by mouth daily.   Yes [provider]  B-D TB SYRINGE 1CC/27GX1/2" 27G X 1/2" 1 ML MISC  02/22/17  Yes [provider]  cyclobenzaprine (FLEXERIL) 5 MG tablet Take 0.5-1 tablets (2.5-5 mg total) by mouth 3 (three) times daily as needed for muscle spasms. 08/24/17  Yes Tereasa Coop, PA-C  folic acid (FOLVITE) 1 MG tablet Take 1 mg by mouth daily.   Yes [provider]  gabapentin (NEURONTIN) 300 MG capsule Take 1 capsule (300 mg total) by mouth 3 (three) times daily. 08/14/17  Yes Stallings, Zoe A, MD  hydrochlorothiazide (HYDRODIURIL) 25 MG tablet TAKE 1 TABLET BY MOUTH ONCE DAILY 07/01/17  Yes Stallings, Zoe A, MD  methotrexate 250 MG/10ML injection Inject 0.6 mLs into the skin every 7 (seven) days.  12/09/15  Yes [provider]  metoprolol succinate (TOPROL-XL) 50 MG 24 hr tablet TAKE 1 TABLET BY MOUTH ONCE DAILY. TAKE WITH OR IMMEDIATELY FOLLOWING A MEAL 06/26/17  Yes Delia Chimes A, MD  primidone (MYSOLINE) 50 MG tablet Take 50 mg by mouth 2 (two) times daily.  04/05/13  Yes Darlyne Russian, MD  ranitidine (ZANTAC) 150 MG tablet Take 1 tablet (150 mg total) by mouth 2 (two) times daily. 05/06/17  Yes Taleia Sadowski, PA-C  Tamsulosin HCl (FLOMAX) 0.4 MG CAPS Take 0.4 mg by mouth at bedtime.    Yes [provider]  traMADol Veatrice Bourbon) 50 MG tablet  06/26/17   [provider]       Allergies  Allergen Reactions  . Lisinopril Cough  . Other Other (See Comments)    BLOOD PRODUCT REFUSAL (patient is a Jehovah's Witness)       Objective:   Physical Exam  Constitutional: He is oriented to person, place, and time. He appears well-developed and well-nourished. He is active and cooperative. No distress.  BP 128/74 (BP Location: Left Arm, Patient Position: Sitting, Cuff Size: Large)   Pulse 82   Temp 98.8 F (37.1 C) (Oral)   Resp 18   Ht 5' 4.75" (1.645 m)   Wt 232 lb 9.6 oz (105.5 kg)   SpO2 96%   BMI 39.01 kg/m    Eyes: Conjunctivae are normal.  Pulmonary/Chest: Effort normal.  Musculoskeletal:       Lumbar back: He exhibits decreased range of motion, tenderness and pain. He exhibits no bony tenderness, no swelling, no edema, no deformity, no laceration and normal pulse.       Back:  Neurological: He is alert and oriented to person, place, and time. He has normal strength. No sensory deficit.  Reflex Scores:      Patellar reflexes are 2+ on the right side and 2+ on the left side.      Achilles reflexes are 2+ on the right side and 2+ on the left side. Good strength of the lower extremities.  Skin: Skin is warm and dry.  Psychiatric: He has a normal mood and affect. His speech is normal and behavior is normal.        Assessment & Plan:   1. Acute right-sided low back pain with right-sided sciatica Rest. Ice today, changing to heat tomorrow. Continue tramadol PRN.  - traMADol (ULTRAM) 50 MG tablet; Take 1 tablet (50 mg total) every 8 (eight) hours as needed by mouth for severe pain.  Dispense: 30 tablet; Refill: 0    Return if symptoms worsen or fail to improve.   Fara Chute, PA-C [facility]

## 2017-09-30 DIAGNOSIS — E669 Obesity, unspecified: Secondary | ICD-10-CM | POA: Diagnosis not present

## 2017-09-30 DIAGNOSIS — I7300 Raynaud's syndrome without gangrene: Secondary | ICD-10-CM | POA: Diagnosis not present

## 2017-09-30 DIAGNOSIS — M0579 Rheumatoid arthritis with rheumatoid factor of multiple sites without organ or systems involvement: Secondary | ICD-10-CM | POA: Diagnosis not present

## 2017-09-30 DIAGNOSIS — M2550 Pain in unspecified joint: Secondary | ICD-10-CM | POA: Diagnosis not present

## 2017-09-30 DIAGNOSIS — Z6838 Body mass index (BMI) 38.0-38.9, adult: Secondary | ICD-10-CM | POA: Diagnosis not present

## 2017-09-30 DIAGNOSIS — M5416 Radiculopathy, lumbar region: Secondary | ICD-10-CM | POA: Diagnosis not present

## 2017-10-04 ENCOUNTER — Ambulatory Visit (INDEPENDENT_AMBULATORY_CARE_PROVIDER_SITE_OTHER): Payer: Medicare HMO | Admitting: Family Medicine

## 2017-10-04 ENCOUNTER — Encounter: Payer: Self-pay | Admitting: Family Medicine

## 2017-10-04 VITALS — BP 132/81 | HR 94 | Temp 98.3°F | Resp 18 | Ht 64.0 in | Wt 232.8 lb

## 2017-10-04 DIAGNOSIS — M05742 Rheumatoid arthritis with rheumatoid factor of left hand without organ or systems involvement: Secondary | ICD-10-CM | POA: Diagnosis not present

## 2017-10-04 DIAGNOSIS — L03319 Cellulitis of trunk, unspecified: Secondary | ICD-10-CM

## 2017-10-04 DIAGNOSIS — M05741 Rheumatoid arthritis with rheumatoid factor of right hand without organ or systems involvement: Secondary | ICD-10-CM | POA: Diagnosis not present

## 2017-10-04 DIAGNOSIS — M542 Cervicalgia: Secondary | ICD-10-CM

## 2017-10-04 DIAGNOSIS — G8929 Other chronic pain: Secondary | ICD-10-CM | POA: Diagnosis not present

## 2017-10-04 DIAGNOSIS — L02219 Cutaneous abscess of trunk, unspecified: Secondary | ICD-10-CM | POA: Diagnosis not present

## 2017-10-04 DIAGNOSIS — Z79899 Other long term (current) drug therapy: Secondary | ICD-10-CM

## 2017-10-04 MED ORDER — DICLOFENAC SODIUM 75 MG PO TBEC: 75.0000 mg | DELAYED_RELEASE_TABLET | Freq: Two times a day (BID) | ORAL | 0 refills | Status: DC | PRN

## 2017-10-04 MED ORDER — DOXYCYCLINE HYCLATE 100 MG PO TABS: 100.0000 mg | ORAL_TABLET | Freq: Two times a day (BID) | ORAL | 0 refills | Status: DC

## 2017-10-04 MED ORDER — CYCLOBENZAPRINE HCL 5 MG PO TABS
2.5000 mg | ORAL_TABLET | Freq: Three times a day (TID) | ORAL | 1 refills | Status: DC | PRN
Start: 2017-10-04 — End: 2017-12-03

## 2017-10-04 MED ORDER — MUPIROCIN 2 % EX OINT: 1.0000 "application " | TOPICAL_OINTMENT | Freq: Two times a day (BID) | CUTANEOUS | 1 refills | Status: DC

## 2017-10-04 NOTE — Progress Notes (Addendum)
Subjective:  By signing my name below, I, Bryan [name], attest that this documentation has been prepared under the direction and in the presence of Bryan Cheadle, MD Electronically Signed: Ladene [name], ED Scribe 10/04/2017 at 10:37 AM.   Patient ID: Bryan [name], male    DOB: 16-Nov-1945, 71 y.o.   MRN: [id number]  Chief Complaint  Patient presents with  . Abscess    recurrent abscess on his behind area; with drainage/odor   HPI Bryan [name] is a 71 y.o. male who presents to Primary Care at [facility] complaining of a non-tender, draining abscess with an odor to the R buttock over a month ago. He reports that this is a recurrent problem. Pt states that the area started as a boil that he applied Boil Ease to for 1 month but then it started bleeding. He has also applied peroxide and alcohol without relief.   Chronic Neck Pain Pt also reports a fall that occurred in September while walking down a wet ramp at work. States he struck his head and then landed on his buttocks. Denies LOC. Reports swelling to the buttocks, neck pain and neck stiffness that improved with Prednisone, but states pain and stiffness has returned since finishing the medication. Denies radiation down upper extremities. Requests a refill of Prednisone at this visit. H/o C4-5, C5-6 fusion in 2014 by Dr. Luiz [name]. H/o RA, on weekly Humira injections.   Past Medical History:  Diagnosis Date  . Anemia   . Arthritis    hands, knees, cervical area. Back pain. Rheumatoid arthritis- weekly injections.  [name] BPH (benign prostatic hypertrophy)   . Essential tremor   . GERD (gastroesophageal reflux disease)    reports for indigestion he uses mustard   . Hearing deficit    wears hearing aids bilateral  . HTN (hypertension)   . Obesity   . Refusal of blood transfusions as patient is Jehovah's Witness   . Right bundle branch block    history of  . Seizures ([facility])    AS A CHILD. only esential tremors now.  . Sleep apnea    cpap -  settings at 9 per patient   . Ulcer    Current Outpatient Medications on File Prior to Visit  Medication Sig Dispense Refill  . acetaminophen (TYLENOL) 500 MG tablet Take 2 tablets (1,000 mg total) by mouth every 8 (eight) hours as needed for mild pain, moderate pain or fever. Take 2 tabs every 8 hours. 90 tablet prn  . Adalimumab (HUMIRA PEN [place]) Inject into the skin.    [name] aspirin EC 81 MG tablet Take 81 mg by mouth daily.    . B-D TB SYRINGE 1CC/27GX1/2" 27G X 1/2" 1 ML MISC     . cyclobenzaprine (FLEXERIL) 5 MG tablet Take 0.5-1 tablets (2.5-5 mg total) by mouth 3 (three) times daily as needed for muscle spasms. 30 tablet 1  . folic acid (FOLVITE) 1 MG tablet Take 1 mg by mouth daily.    [name] gabapentin (NEURONTIN) 300 MG capsule Take 1 capsule (300 mg total) by mouth 3 (three) times daily. 90 capsule 3  . hydrochlorothiazide (HYDRODIURIL) 25 MG tablet TAKE 1 TABLET BY MOUTH ONCE DAILY 30 tablet 6  . methotrexate 250 MG/10ML injection Inject 0.6 mLs into the skin every 7 (seven) days.     . metoprolol succinate (TOPROL-XL) 50 MG 24 hr tablet TAKE 1 TABLET BY MOUTH ONCE DAILY. TAKE WITH OR IMMEDIATELY FOLLOWING A MEAL 90 tablet 3  . primidone (  MYSOLINE) 50 MG tablet Take 50 mg by mouth 2 (two) times daily.     . ranitidine (ZANTAC) 150 MG tablet Take 1 tablet (150 mg total) by mouth 2 (two) times daily. 60 tablet 0  . Tamsulosin HCl (FLOMAX) 0.4 MG CAPS Take 0.4 mg by mouth at bedtime.     . traMADol (ULTRAM) 50 MG tablet Take 1 tablet (50 mg total) every 8 (eight) hours as needed by mouth for severe pain. 30 tablet 0   No current facility-administered medications on file prior to visit.    Allergies  Allergen Reactions  . Lisinopril Cough  . Other Other (See Comments)    BLOOD PRODUCT REFUSAL (patient is a Jehovah's Witness)   Past Surgical History:  Procedure Laterality Date  . ANTERIOR CERVICAL DECOMP/DISCECTOMY FUSION N/A 03/30/2013   Procedure: ANTERIOR CERVICAL  DECOMPRESSION/DISCECTOMY FUSION 2 LEVELS;  Surgeon: Otilio Connors, MD;  Location: [facility];  Service: Neurosurgery;  Laterality: N/A;  C4-5 C5-6 Anterior cervical decompression/diskectomy/fusion/Allograft/Plate  . CHOLECYSTECTOMY N/A 12/07/2013   Procedure: LAPAROSCOPIC CHOLECYSTECTOMY WITH INTRAOPERATIVE CHOLANGIOGRAM;  Surgeon: Adin Hector, MD;  Location: [facility];  Service: General;  Laterality: N/A;  . COLONOSCOPY    . DECOMPRESSIVE LUMBAR LAMINECTOMY LEVEL 2 N/A 02/15/2015   Procedure: COMPLETE DECOMPRESSIVE LUMBAR LAMINECTOMY L4-L5/ FORAMINOTOMY TO L4 NERVE ROOT AND L5 NERVE ROOT BILATERALLY;  Surgeon: [name], MD;  Location: [facility];  Service: Orthopedics;  Laterality: N/A;  . EYE SURGERY     right, growth excision  . LUMBAR LAMINECTOMY/DECOMPRESSION MICRODISCECTOMY Left 01/18/2016   Procedure:  DECOMPRESSION L4-L5 MICRODISCECTOMY L4-L5 ON LEFT FOR SPINAL STENOSIS;  Surgeon: [name], MD;  Location: [facility];  Service: Orthopedics;  Laterality: Left;  . SPINE SURGERY    . TONSILLECTOMY    . VASECTOMY    . WRIST GANGLION EXCISION Left    Family History  Problem Relation Age of Onset  . Colon cancer Maternal Grandfather   . Kidney disease Brother   . Stroke Maternal Grandmother   . Diabetes Paternal Aunt        x 4 aunts   Social History   Socioeconomic History  . Marital status: Married    Spouse name: None  . Number of children: 2  . Years of education: None  . Highest education level: None  Social Needs  . Financial resource strain: None  . Food insecurity - worry: None  . Food insecurity - inability: None  . Transportation needs - medical: None  . Transportation needs - non-medical: None  Occupational History  . Occupation: bus Education administrator: RETIRED  Tobacco Use  . Smoking status: Former Smoker    Types: Cigarettes    Last attempt to quit: 12/14/1972    Years since quitting: 44.8  . Smokeless tobacco: Never Used  Substance and Sexual Activity  .  Alcohol use: Yes    Comment: occasionally  . Drug use: No  . Sexual activity: Yes    Birth control/protection: None  Other Topics Concern  . None  Social History Narrative  . None   Depression screen [facility] 2/9 10/04/2017 09/27/2017 08/24/2017 08/12/2017 05/30/2017  Decreased Interest 0 0 1 0 0  Down, Depressed, Hopeless 0 0 3 3 0  PHQ - 2 Score 0 0 4 3 0  Altered sleeping - - 3 3 -  Tired, decreased energy - - 3 3 -  Change in appetite - - 1 0 -  Feeling bad or failure about yourself  - -  2 3 -  Trouble concentrating - - 3 3 -  Moving slowly or fidgety/restless - - 1 1 -  Suicidal thoughts - - 0 0 -  PHQ-9 Score - - 17 16 -    Review of Systems  Musculoskeletal: Positive for neck pain (chronic) and neck stiffness.  Skin:       +Abscess to R buttock  see HPI    Objective:   Physical Exam  Constitutional: He is oriented to person, place, and time. He appears well-developed and well-nourished. No distress.  HENT:  Head: Normocephalic and atraumatic.  Eyes: Conjunctivae and EOM are normal.  Neck: Neck supple. No tracheal deviation present.  Cardiovascular: Normal rate.  Pulmonary/Chest: Effort normal. No respiratory distress.  Genitourinary:  Genitourinary Comments: Induration over the mid R glute ~9 x 7 cm with 1 cm area of open drainage and 2 pinpoint areas to the lateral and upper sides prior drainage but no heat, tenderness or fluctuance.  Musculoskeletal: Normal range of motion.  Neurological: He is alert and oriented to person, place, and time.  Skin: Skin is warm and dry.  Psychiatric: He has a normal mood and affect. His behavior is normal.  Nursing note and vitals reviewed.  BP 132/81   Pulse 94   Temp 98.3 F (36.8 C) (Oral)   Resp 18   Ht 5' 4"  (1.626 m)   Wt 232 lb 12.8 oz (105.6 kg)   SpO2 95%   BMI 39.96 kg/m     Assessment & Plan:   1. Cellulitis and abscess of trunk - seems to have already drained, no fluctuance area palp, no sig tenderness so start  doxy and qid wet warm compresses followed by top mupirocin. RTC immed if worsening at all.  2. Cervicalgia   3. Chronic neck pain   4. Neck pain   5.      Rheumatoid arthritis -  6.      High risk medication use - Last renal function check 1.5 yrs prior creatinine was 1.12, egfr 76. Okay to use NSAIDs with low dose methotrexate used for RA as increase in methotrexate rarely clinically significant.    Meds ordered this encounter  Medications  . mupirocin ointment (BACTROBAN) 2 %    Sig: Apply 1 application topically 2 (two) times daily. After warm water epsom salt soak/sitz bath    Dispense:  30 g    Refill:  1  . doxycycline (VIBRA-TABS) 100 MG tablet    Sig: Take 1 tablet (100 mg total) by mouth 2 (two) times daily.    Dispense:  20 tablet    Refill:  0  . cyclobenzaprine (FLEXERIL) 5 MG tablet    Sig: Take 0.5-1 tablets (2.5-5 mg total) by mouth 3 (three) times daily as needed for muscle spasms.    Dispense:  30 tablet    Refill:  1  . diclofenac (VOLTAREN) 75 MG EC tablet    Sig: Take 1 tablet (75 mg total) by mouth 2 (two) times daily as needed (for neck pain).    Dispense:  30 tablet    Refill:  0    I personally performed the services described in this documentation, which was scribed in my presence. The recorded information has been reviewed and considered, and addended by me as needed.   Bryan [name], M.D.  Primary Care at [facility] [address] [place], [place] [address] [phone] phone [phone] fax  10/07/17 10:14 AM

## 2017-10-04 NOTE — Patient Instructions (Addendum)
Skin Abscess A skin abscess is an infected area on or under your skin that contains pus and other material. An abscess can happen almost anywhere on your body. Some abscesses break open (rupture) on their own. Most continue to get worse unless they are treated. The infection can spread deeper into the body and into your blood, which can make you feel sick. Treatment usually involves draining the abscess. Follow these instructions at home: Abscess Care  If you have an abscess that has not drained, place a warm, clean, wet washcloth over the abscess several times a day. Do this as told by your doctor.  Follow instructions from your doctor about how to take care of your abscess. Make sure you: ? Cover the abscess with a bandage (dressing). ? Change your bandage or gauze as told by your doctor. ? Wash your hands with soap and water before you change the bandage or gauze. If you cannot use soap and water, use hand sanitizer.  Check your abscess every day for signs that the infection is getting worse. Check for: ? More redness, swelling, or pain. ? More fluid or blood. ? Warmth. ? More pus or a bad smell. Medicines   Take over-the-counter and prescription medicines only as told by your doctor.  If you were prescribed an antibiotic medicine, take it as told by your doctor. Do not stop taking the antibiotic even if you start to feel better. General instructions  To avoid spreading the infection: ? Do not share personal care items, towels, or hot tubs with others. ? Avoid making skin-to-skin contact with other people.  Keep all follow-up visits as told by your doctor. This is important. Contact a doctor if:  You have more redness, swelling, or pain around your abscess.  You have more fluid or blood coming from your abscess.  Your abscess feels warm when you touch it.  You have more pus or a bad smell coming from your abscess.  You have a fever.  Your muscles ache.  You have  chills.  You feel sick. Get help right away if:  You have very bad (severe) pain.  You see red streaks on your skin spreading away from the abscess. This information is not intended to replace advice given to you by your health care provider. Make sure you discuss any questions you have with your health care provider. Document Released: 04/16/2008 Document Revised: 06/24/2016 Document Reviewed: 09/07/2015 Elsevier Interactive Patient Education  2018 [employer].     IF you received an x-ray today, you will receive an invoice from [facility]. Please contact [facility] at [phone] with questions or concerns regarding your invoice.   IF you received labwork today, you will receive an invoice from [place]. Please contact [facility] at [phone] with questions or concerns regarding your invoice.   Our billing staff will not be able to assist you with questions regarding bills from these companies.  You will be contacted with the lab results as soon as they are available. The fastest way to get your results is to activate your My Chart account. Instructions are located on the last page of this paperwork. If you have not heard from [place] regarding the results in 2 weeks, please contact this office.      Skin Abscess A skin abscess is an infected area on or under your skin that contains a collection of pus and other material. An abscess may also be called a furuncle, carbuncle, or boil. An abscess can occur  in or on almost any part of your body. Some abscesses break open (rupture) on their own. Most continue to get worse unless they are treated. The infection can spread deeper into the body and eventually into your blood, which can make you feel ill. Treatment usually involves draining the abscess. What are the causes? An abscess occurs when germs, often bacteria, pass through your skin and cause an infection. This may be caused by:  A scrape or cut on your skin.  A  puncture wound through your skin, including a needle injection.  Blocked oil or sweat glands.  Blocked and infected hair follicles.  A cyst that forms beneath your skin (sebaceous cyst) and becomes infected.  What increases the risk? This condition is more likely to develop in people who:  Have a weak body defense system (immune system).  Have diabetes.  Have dry and irritated skin.  Get frequent injections or use illegal IV drugs.  Have a foreign body in a wound, such as a splinter.  Have problems with their lymph system or veins.  What are the signs or symptoms? An abscess may start as a painful, firm bump under the skin. Over time, the abscess may get larger or become softer. Pus may appear at the top of the abscess, causing pressure and pain. It may eventually break through the skin and drain. Other symptoms include:  Redness.  Warmth.  Swelling.  Tenderness.  A sore on the skin.  How is this diagnosed? This condition is diagnosed based on your medical history and a physical exam. A sample of pus may be taken from the abscess to find out what is causing the infection and what antibiotics can be used to treat it. You also may have:  Blood tests to look for signs of infection or spread of an infection to your blood.  Imaging studies such as ultrasound, CT scan, or MRI if the abscess is deep.  How is this treated? Small abscesses that drain on their own may not need treatment. Treatment for an abscess that does not rupture on its own may include:  Warm compresses applied to the area several times per day.  Incision and drainage. Your health care provider will make an incision to open the abscess and will remove pus and any foreign body or dead tissue. The incision area may be packed with gauze to keep it open for a few days while it heals.  Antibiotic medicines to treat infection. For a severe abscess, you may first get antibiotics through an IV and then change to  oral antibiotics.  Follow these instructions at home: Abscess Care  If you have an abscess that has not drained, place a warm, clean, wet washcloth over the abscess several times a day. Do this as told by your health care provider.  Follow instructions from your health care provider about how to take care of your abscess. Make sure you: ? Cover the abscess with a bandage (dressing). ? Change your dressing or gauze as told by your health care provider. ? Wash your hands with soap and water before you change the dressing or gauze. If soap and water are not available, use hand sanitizer.  Check your abscess every day for signs of a worsening infection. Check for: ? More redness, swelling, or pain. ? More fluid or blood. ? Warmth. ? More pus or a bad smell. Medicines  Take over-the-counter and prescription medicines only as told by your health care provider.  If you  were prescribed an antibiotic medicine, take it as told by your health care provider. Do not stop taking the antibiotic even if you start to feel better. General instructions  To avoid spreading the infection: ? Do not share personal care items, towels, or hot tubs with others. ? Avoid making skin contact with other people.  Keep all follow-up visits as told by your health care provider. This is important. Contact a health care provider if:  You have more redness, swelling, or pain around your abscess.  You have more fluid or blood coming from your abscess.  Your abscess feels warm to the touch.  You have more pus or a bad smell coming from your abscess.  You have a fever.  You have muscle aches.  You have chills or a general ill feeling. Get help right away if:  You have severe pain.  You see red streaks on your skin spreading away from the abscess. This information is not intended to replace advice given to you by your health care provider. Make sure you discuss any questions you have with your health care  provider. Document Released: 08/08/2005 Document Revised: 06/24/2016 Document Reviewed: 09/07/2015 Elsevier Interactive Patient Education  2018 [place].  Cervical Strain and Sprain Rehab Ask your health care provider which exercises are safe for you. Do exercises exactly as told by your health care provider and adjust them as directed. It is normal to feel mild stretching, pulling, tightness, or discomfort as you do these exercises, but you should stop right away if you feel sudden pain or your pain gets worse.Do not begin these exercises until told by your health care provider. Stretching and range of motion exercises These exercises warm up your muscles and joints and improve the movement and flexibility of your neck. These exercises also help to relieve pain, numbness, and tingling. Exercise A: Cervical side bend  1. Using good posture, sit on a stable chair or stand up. 2. Without moving your shoulders, slowly tilt your left / right ear to your shoulder until you feel a stretch in your neck muscles. You should be looking straight ahead. 3. Hold for __________ seconds. 4. Repeat with the other side of your neck. Repeat __________ times. Complete this exercise __________ times a day. Exercise B: Cervical rotation  1. Using good posture, sit on a stable chair or stand up. 2. Slowly turn your head to the side as if you are looking over your left / right shoulder. ? Keep your eyes level with the ground. ? Stop when you feel a stretch along the side and the back of your neck. 3. Hold for __________ seconds. 4. Repeat this by turning to your other side. Repeat __________ times. Complete this exercise __________ times a day. Exercise C: Thoracic extension and pectoral stretch 1. Roll a towel or a small blanket so it is about 4 inches (10 cm) in diameter. 2. Lie down on your back on a firm surface. 3. Put the towel lengthwise, under your spine in the middle of your back. It should not be  not under your shoulder blades. The towel should line up with your spine from your middle back to your lower back. 4. Put your hands behind your head and let your elbows fall out to your sides. 5. Hold for __________ seconds. Repeat __________ times. Complete this exercise __________ times a day. Strengthening exercises These exercises build strength and endurance in your neck. Endurance is the ability to use your muscles for a  long time, even after your muscles get tired. Exercise D: Upper cervical flexion, isometric 1. Lie on your back with a thin pillow behind your head and a small rolled-up towel under your neck. 2. Gently tuck your chin toward your chest and nod your head down to look toward your feet. Do not lift your head off the pillow. 3. Hold for __________ seconds. 4. Release the tension slowly. Relax your neck muscles completely before you repeat this exercise. Repeat __________ times. Complete this exercise __________ times a day. Exercise E: Cervical extension, isometric  1. Stand about 6 inches (15 cm) away from a wall, with your back facing the wall. 2. Place a soft object, about 6-8 inches (15-20 cm) in diameter, between the back of your head and the wall. A soft object could be a small pillow, a ball, or a folded towel. 3. Gently tilt your head back and press into the soft object. Keep your jaw and forehead relaxed. 4. Hold for __________ seconds. 5. Release the tension slowly. Relax your neck muscles completely before you repeat this exercise. Repeat __________ times. Complete this exercise __________ times a day. Posture and body mechanics  Body mechanics refers to the movements and positions of your body while you do your daily activities. Posture is part of body mechanics. Good posture and healthy body mechanics can help to relieve stress in your body's tissues and joints. Good posture means that your spine is in its natural S-curve position (your spine is neutral), your  shoulders are pulled back slightly, and your head is not tipped forward. The following are general guidelines for applying improved posture and body mechanics to your everyday activities. Standing  When standing, keep your spine neutral and keep your feet about hip-width apart. Keep a slight bend in your knees. Your ears, shoulders, and hips should line up.  When you do a task in which you stand in one place for a long time, place one foot up on a stable object that is 2-4 inches (5-10 cm) high, such as a footstool. This helps keep your spine neutral. Sitting   When sitting, keep your spine neutral and your keep feet flat on the floor. Use a footrest, if necessary, and keep your thighs parallel to the floor. Avoid rounding your shoulders, and avoid tilting your head forward.  When working at a desk or a computer, keep your desk at a height where your hands are slightly lower than your elbows. Slide your chair under your desk so you are close enough to maintain good posture.  When working at a computer, place your monitor at a height where you are looking straight ahead and you do not have to tilt your head forward or downward to look at the screen. Resting When lying down and resting, avoid positions that are most painful for you. Try to support your neck in a neutral position. You can use a contour pillow or a small rolled-up towel. Your pillow should support your neck but not push on it. This information is not intended to replace advice given to you by your health care provider. Make sure you discuss any questions you have with your health care provider. Document Released: 10/29/2005 Document Revised: 07/05/2016 Document Reviewed: 10/05/2015 Elsevier Interactive Patient Education  [employer].

## 2017-10-10 ENCOUNTER — Ambulatory Visit (INDEPENDENT_AMBULATORY_CARE_PROVIDER_SITE_OTHER): Payer: Medicare HMO | Admitting: Emergency Medicine

## 2017-10-10 ENCOUNTER — Encounter: Payer: Self-pay | Admitting: Emergency Medicine

## 2017-10-10 ENCOUNTER — Other Ambulatory Visit: Payer: Self-pay

## 2017-10-10 VITALS — BP 114/60 | HR 98 | Temp 98.0°F | Resp 16 | Ht 64.0 in | Wt 234.2 lb

## 2017-10-10 DIAGNOSIS — M5441 Lumbago with sciatica, right side: Secondary | ICD-10-CM

## 2017-10-10 DIAGNOSIS — G8929 Other chronic pain: Secondary | ICD-10-CM | POA: Diagnosis not present

## 2017-10-10 DIAGNOSIS — M25561 Pain in right knee: Secondary | ICD-10-CM | POA: Insufficient documentation

## 2017-10-10 DIAGNOSIS — M5431 Sciatica, right side: Secondary | ICD-10-CM | POA: Diagnosis not present

## 2017-10-10 DIAGNOSIS — M79604 Pain in right leg: Secondary | ICD-10-CM

## 2017-10-10 NOTE — Progress Notes (Signed)
Bryan [name] 71 y.o.   Chief Complaint  Patient presents with  . Leg Pain    per patient RIGHT thigh     HISTORY OF PRESENT ILLNESS: This is a 71 y.o. male complaining of pain to back of right thigh x 3-4 days; denies trauma. Has h/o lumbar surgery 2 years ago and since he's had chronic low back pain. Concerned about blood clot.  HPI   Prior to Admission medications   Medication Sig Start Date End Date Taking? Authorizing Provider  Adalimumab (HUMIRA PEN [place]) Inject into the skin.   Yes [provider]  aspirin EC 81 MG tablet Take 81 mg by mouth daily.   Yes [provider]  cyclobenzaprine (FLEXERIL) 5 MG tablet Take 0.5-1 tablets (2.5-5 mg total) by mouth 3 (three) times daily as needed for muscle spasms. 10/04/17  Yes Shawnee Knapp, MD  diclofenac (VOLTAREN) 75 MG EC tablet Take 1 tablet (75 mg total) by mouth 2 (two) times daily as needed (for neck pain). 10/04/17  Yes Shawnee Knapp, MD  doxycycline (VIBRA-TABS) 100 MG tablet Take 1 tablet (100 mg total) by mouth 2 (two) times daily. 10/04/17  Yes Shawnee Knapp, MD  folic acid (FOLVITE) 1 MG tablet Take 1 mg by mouth daily.   Yes [provider]  gabapentin (NEURONTIN) 300 MG capsule Take 1 capsule (300 mg total) by mouth 3 (three) times daily. 08/14/17  Yes Stallings, Zoe A, MD  hydrochlorothiazide (HYDRODIURIL) 25 MG tablet TAKE 1 TABLET BY MOUTH ONCE DAILY 07/01/17  Yes Stallings, Zoe A, MD  methotrexate 250 MG/10ML injection Inject 0.6 mLs into the skin every 7 (seven) days.  12/09/15  Yes [provider]  metoprolol succinate (TOPROL-XL) 50 MG 24 hr tablet TAKE 1 TABLET BY MOUTH ONCE DAILY. TAKE WITH OR IMMEDIATELY FOLLOWING A MEAL 06/26/17  Yes Stallings, Zoe A, MD  mupirocin ointment (BACTROBAN) 2 % Apply 1 application topically 2 (two) times daily. After warm water epsom salt soak/sitz bath 10/04/17  Yes Shawnee Knapp, MD  primidone (MYSOLINE) 50 MG tablet Take 50 mg by mouth 2 (two) times daily.   04/05/13  Yes Darlyne Russian, MD  ranitidine (ZANTAC) 150 MG tablet Take 1 tablet (150 mg total) by mouth 2 (two) times daily. 05/06/17  Yes Jeffery, Chelle, PA-C  Tamsulosin HCl (FLOMAX) 0.4 MG CAPS Take 0.4 mg by mouth at bedtime.    Yes [provider]  traMADol (ULTRAM) 50 MG tablet Take 1 tablet (50 mg total) every 8 (eight) hours as needed by mouth for severe pain. 09/27/17  Yes Jeffery, Chelle, PA-C  acetaminophen (TYLENOL) 500 MG tablet Take 2 tablets (1,000 mg total) by mouth every 8 (eight) hours as needed for mild pain, moderate pain or fever. Take 2 tabs every 8 hours. Patient not taking: Reported on 10/10/2017 08/24/17 08/24/18  Tereasa Coop, PA-C  B-D TB SYRINGE 1CC/27GX1/2" 27G X 1/2" 1 ML MISC  02/22/17   [provider]    Allergies  Allergen Reactions  . Lisinopril Cough  . Other Other (See Comments)    BLOOD PRODUCT REFUSAL (patient is a Jehovah's Witness)    Patient Active Problem List   Diagnosis Date Noted  . Arthralgia of right lower leg 10/10/2017  . Class 2 severe obesity due to excess calories with serious comorbidity and body mass index (BMI) of 39.0 to 39.9 in adult ([place]) 03/07/2017  . ACE-inhibitor cough 03/07/2017  . Lumbago 06/21/2016  . Spinal  stenosis, lumbar region, with neurogenic claudication 02/15/2015  . Obesity (BMI 30-39.9) 12/07/2013  . Refusal of blood transfusions as patient is Jehovah's Witness 12/07/2013  . Rheumatoid arthritis ([place]) 10/26/2012  . Nonspecific abnormal finding in stool contents 07/28/2012  . Multiple joint pain 06/05/2012  . History of tobacco use-  06/05/2012  . Asthmatic bronchitis 12/15/2011  . BPH (benign prostatic hyperplasia) 12/15/2011  . Essential tremor 12/15/2011  . Essential hypertension 12/15/2011    Past Medical History:  Diagnosis Date  . Anemia   . Arthritis    hands, knees, cervical area. Back pain. Rheumatoid arthritis- weekly injections.  [name] BPH (benign prostatic hypertrophy)   .  Essential tremor   . GERD (gastroesophageal reflux disease)    reports for indigestion he uses mustard   . Hearing deficit    wears hearing aids bilateral  . HTN (hypertension)   . Obesity   . Refusal of blood transfusions as patient is Jehovah's Witness   . Right bundle branch block    history of  . Seizures (HCC)    AS A CHILD. only esential tremors now.  . Sleep apnea    cpap - settings at 9 per patient   . Ulcer     Past Surgical History:  Procedure Laterality Date  . ANTERIOR CERVICAL DECOMP/DISCECTOMY FUSION N/A 03/30/2013   Procedure: ANTERIOR CERVICAL DECOMPRESSION/DISCECTOMY FUSION 2 LEVELS;  Surgeon: Otilio Connors, MD;  Location: [facility];  Service: Neurosurgery;  Laterality: N/A;  C4-5 C5-6 Anterior cervical decompression/diskectomy/fusion/Allograft/Plate  . CHOLECYSTECTOMY N/A 12/07/2013   Procedure: LAPAROSCOPIC CHOLECYSTECTOMY WITH INTRAOPERATIVE CHOLANGIOGRAM;  Surgeon: Adin Hector, MD;  Location: [facility];  Service: General;  Laterality: N/A;  . COLONOSCOPY    . DECOMPRESSIVE LUMBAR LAMINECTOMY LEVEL 2 N/A 02/15/2015   Procedure: COMPLETE DECOMPRESSIVE LUMBAR LAMINECTOMY L4-L5/ FORAMINOTOMY TO L4 NERVE ROOT AND L5 NERVE ROOT BILATERALLY;  Surgeon: [name], MD;  Location: [facility];  Service: Orthopedics;  Laterality: N/A;  . EYE SURGERY     right, growth excision  . LUMBAR LAMINECTOMY/DECOMPRESSION MICRODISCECTOMY Left 01/18/2016   Procedure:  DECOMPRESSION L4-L5 MICRODISCECTOMY L4-L5 ON LEFT FOR SPINAL STENOSIS;  Surgeon: [name], MD;  Location: [facility];  Service: Orthopedics;  Laterality: Left;  . SPINE SURGERY    . TONSILLECTOMY    . VASECTOMY    . WRIST GANGLION EXCISION Left     Social History   Socioeconomic History  . Marital status: Married    Spouse name: Not on file  . Number of children: 2  . Years of education: Not on file  . Highest education level: Not on file  Social Needs  . Financial resource strain: Not on file  . Food  insecurity - worry: Not on file  . Food insecurity - inability: Not on file  . Transportation needs - medical: Not on file  . Transportation needs - non-medical: Not on file  Occupational History  . Occupation: bus Education administrator: RETIRED  Tobacco Use  . Smoking status: Former Smoker    Types: Cigarettes    Last attempt to quit: 12/14/1972    Years since quitting: 44.8  . Smokeless tobacco: Never Used  Substance and Sexual Activity  . Alcohol use: Yes    Comment: occasionally  . Drug use: No  . Sexual activity: Yes    Birth control/protection: None  Other Topics Concern  . Not on file  Social History Narrative  . Not on file    Family History  Problem Relation Age of Onset  . Colon cancer Maternal Grandfather   . Kidney disease Brother   . Stroke Maternal Grandmother   . Diabetes Paternal Aunt        x 4 aunts     Review of Systems  Constitutional: Negative.  Negative for chills and fever.  HENT: Negative for nosebleeds and sore throat.   Respiratory: Negative for cough and shortness of breath.   Cardiovascular: Negative for chest pain, palpitations and leg swelling.  Gastrointestinal: Negative for abdominal pain, diarrhea, nausea and vomiting.  Genitourinary: Negative for dysuria and hematuria.  Musculoskeletal: Positive for back pain.  Skin:       +boil left buttock  Neurological: Negative for dizziness and headaches.  Endo/Heme/Allergies: Negative.   All other systems reviewed and are negative.  Vitals:   10/10/17 1444  BP: 114/60  Pulse: 98  Resp: 16  Temp: 98 F (36.7 C)  SpO2: 96%     Physical Exam  Constitutional: He is oriented to person, place, and time. He appears well-developed and well-nourished.  HENT:  Head: Normocephalic and atraumatic.  Nose: Nose normal.  Mouth/Throat: Oropharynx is clear and moist.  Eyes: EOM are normal. Pupils are equal, round, and reactive to light.  Neck: Normal range of motion. Neck supple.  Cardiovascular:  Normal rate, regular rhythm and normal heart sounds.  Pulmonary/Chest: Effort normal and breath sounds normal.  Abdominal: Soft. He exhibits no distension and no mass. There is no tenderness. There is no guarding.  Musculoskeletal:       Lumbar back: He exhibits decreased range of motion, tenderness and pain. He exhibits no bony tenderness and normal pulse.  RLL: no swelling or erythema; mild tenderness posterior thigh; NVI with FROM  Lymphadenopathy:    He has no cervical adenopathy.  Neurological: He is alert and oriented to person, place, and time. No sensory deficit. He exhibits normal muscle tone.  Skin: Skin is warm and dry. Capillary refill takes less than 2 seconds.  Psychiatric: He has a normal mood and affect. His behavior is normal.  Vitals reviewed.   A total of 25 minutes was spent in the room with the patient, greater than 50% of which was in counseling/coordination of care regarding present condition and DDx.  ASSESSMENT & PLAN: Cleon was seen today for leg pain.  Diagnoses and all orders for this visit:  Right leg pain -     VAS [place] LOWER EXTREMITY VENOUS (DVT); Future  Sciatica of right side  Chronic bilateral low back pain with right-sided sciatica    Patient Instructions   You are scheduled for an ultrasound tomorrow at [facility] at 11am. Please arrive 15 minutes prior to your scheduled time to the main entrance of the hospital. They will direct you to the radiology department once you arrive.     IF you received an x-ray today, you will receive an invoice from [facility]. Please contact [facility] Radiology at [phone] with questions or concerns regarding your invoice.   IF you received labwork today, you will receive an invoice from [place] [facility]. Please contact [facility] at [phone] with questions or concerns regarding your invoice.   Our billing staff will not be able to assist you with questions regarding bills from these  companies.  You will be contacted with the lab results as soon as they are available. The fastest way to get your results is to activate your My Chart account. Instructions are located on the last page of this paperwork.  If you have not heard from [place] regarding the results in 2 weeks, please contact this office.     Sciatica Sciatica is pain, numbness, weakness, or tingling along your sciatic nerve. The sciatic nerve starts in the lower back and goes down the back of each leg. Sciatica happens when this nerve is pinched or has pressure put on it. Sciatica usually goes away on its own or with treatment. Sometimes, sciatica may keep coming back (recur). Follow these instructions at home: Medicines  Take over-the-counter and prescription medicines only as told by your doctor.  Do not drive or use heavy machinery while taking prescription pain medicine. Managing pain  If directed, put ice on the affected area. ? Put ice in a plastic bag. ? Place a towel between your skin and the bag. ? Leave the ice on for 20 minutes, 2-3 times a day.  After icing, apply heat to the affected area before you exercise or as often as told by your doctor. Use the heat source that your doctor tells you to use, such as a moist heat pack or a heating pad. ? Place a towel between your skin and the heat source. ? Leave the heat on for 20-30 minutes. ? Remove the heat if your skin turns bright red. This is especially important if you are unable to feel pain, heat, or cold. You may have a greater risk of getting burned. Activity  Return to your normal activities as told by your doctor. Ask your doctor what activities are safe for you. ? Avoid activities that make your sciatica worse.  Take short rests during the day. Rest in a lying or standing position. This is usually better than sitting to rest. ? When you rest for a long time, do some physical activity or stretching between periods of rest. ? Avoid sitting for a  long time without moving. Get up and move around at least one time each hour.  Exercise and stretch regularly, as told by your doctor.  Do not lift anything that is heavier than 10 lb (4.5 kg) while you have symptoms of sciatica. ? Avoid lifting heavy things even when you do not have symptoms. ? Avoid lifting heavy things over and over.  When you lift objects, always lift in a way that is safe for your body. To do this, you should: ? Bend your knees. ? Keep the object close to your body. ? Avoid twisting. General instructions  Use good posture. ? Avoid leaning forward when you are sitting. ? Avoid hunching over when you are standing.  Stay at a healthy weight.  Wear comfortable shoes that support your feet. Avoid wearing high heels.  Avoid sleeping on a mattress that is too soft or too hard. You might have less pain if you sleep on a mattress that is firm enough to support your back.  Keep all follow-up visits as told by your doctor. This is important. Contact a doctor if:  You have pain that: ? Wakes you up when you are sleeping. ? Gets worse when you lie down. ? Is worse than the pain you have had in the past. ? Lasts longer than 4 weeks.  You lose weight for without trying. Get help right away if:  You cannot control when you pee (urinate) or poop (have a bowel movement).  You have weakness in any of these areas and it gets worse. ? Lower back. ? Lower belly (pelvis). ? Butt (buttocks). ? Legs.  You have redness  or swelling of your back.  You have a burning feeling when you pee. This information is not intended to replace advice given to you by your health care provider. Make sure you discuss any questions you have with your health care provider. Document Released: 08/07/2008 Document Revised: 04/05/2016 Document Reviewed: 07/08/2015 Elsevier Interactive Patient Education  2018 Elsevier Inc.      Agustina Caroli, MD [facility]

## 2017-10-10 NOTE — Patient Instructions (Addendum)
You are scheduled for an ultrasound tomorrow at [facility] at 11am. Please arrive 15 minutes prior to your scheduled time to the main entrance of the hospital. They will direct you to the radiology department once you arrive.     IF you received an x-ray today, you will receive an invoice from [facility] Radiology. Please contact [facility] Radiology at [phone] with questions or concerns regarding your invoice.   IF you received labwork today, you will receive an invoice from [place]. Please contact [facility] at [phone] with questions or concerns regarding your invoice.   Our billing staff will not be able to assist you with questions regarding bills from these companies.  You will be contacted with the lab results as soon as they are available. The fastest way to get your results is to activate your My Chart account. Instructions are located on the last page of this paperwork. If you have not heard from [place] regarding the results in 2 weeks, please contact this office.     Sciatica Sciatica is pain, numbness, weakness, or tingling along your sciatic nerve. The sciatic nerve starts in the lower back and goes down the back of each leg. Sciatica happens when this nerve is pinched or has pressure put on it. Sciatica usually goes away on its own or with treatment. Sometimes, sciatica may keep coming back (recur). Follow these instructions at home: Medicines  Take over-the-counter and prescription medicines only as told by your doctor.  Do not drive or use heavy machinery while taking prescription pain medicine. Managing pain  If directed, put ice on the affected area. ? Put ice in a plastic bag. ? Place a towel between your skin and the bag. ? Leave the ice on for 20 minutes, 2-3 times a day.  After icing, apply heat to the affected area before you exercise or as often as told by your doctor. Use the heat source that your doctor tells you to use, such as a moist heat pack  or a heating pad. ? Place a towel between your skin and the heat source. ? Leave the heat on for 20-30 minutes. ? Remove the heat if your skin turns bright red. This is especially important if you are unable to feel pain, heat, or cold. You may have a greater risk of getting burned. Activity  Return to your normal activities as told by your doctor. Ask your doctor what activities are safe for you. ? Avoid activities that make your sciatica worse.  Take short rests during the day. Rest in a lying or standing position. This is usually better than sitting to rest. ? When you rest for a long time, do some physical activity or stretching between periods of rest. ? Avoid sitting for a long time without moving. Get up and move around at least one time each hour.  Exercise and stretch regularly, as told by your doctor.  Do not lift anything that is heavier than 10 lb (4.5 kg) while you have symptoms of sciatica. ? Avoid lifting heavy things even when you do not have symptoms. ? Avoid lifting heavy things over and over.  When you lift objects, always lift in a way that is safe for your body. To do this, you should: ? Bend your knees. ? Keep the object close to your body. ? Avoid twisting. General instructions  Use good posture. ? Avoid leaning forward when you are sitting. ? Avoid hunching over when you are standing.  Stay at a healthy  weight.  Wear comfortable shoes that support your feet. Avoid wearing high heels.  Avoid sleeping on a mattress that is too soft or too hard. You might have less pain if you sleep on a mattress that is firm enough to support your back.  Keep all follow-up visits as told by your doctor. This is important. Contact a doctor if:  You have pain that: ? Wakes you up when you are sleeping. ? Gets worse when you lie down. ? Is worse than the pain you have had in the past. ? Lasts longer than 4 weeks.  You lose weight for without trying. Get help right away  if:  You cannot control when you pee (urinate) or poop (have a bowel movement).  You have weakness in any of these areas and it gets worse. ? Lower back. ? Lower belly (pelvis). ? Butt (buttocks). ? Legs.  You have redness or swelling of your back.  You have a burning feeling when you pee. This information is not intended to replace advice given to you by your health care provider. Make sure you discuss any questions you have with your health care provider. Document Released: 08/07/2008 Document Revised: 04/05/2016 Document Reviewed: 07/08/2015 Elsevier Interactive Patient Education  [employer].

## 2017-10-11 ENCOUNTER — Ambulatory Visit (HOSPITAL_COMMUNITY)
Admission: RE | Admit: 2017-10-11 | Discharge: 2017-10-11 | Disposition: A | Discharge status: Home / Self Care | Payer: Medicare HMO | Source: Ambulatory Visit | Attending: Emergency Medicine | Admitting: Emergency Medicine

## 2017-10-11 DIAGNOSIS — M79604 Pain in right leg: Secondary | ICD-10-CM | POA: Diagnosis not present

## 2017-10-11 NOTE — Progress Notes (Signed)
*  PRELIMINARY RESULTS* Vascular Ultrasound Right lower extremity venous duplex has been completed.  Preliminary findings: No evidence of deep vein thrombosis in the visualized veins or baker's cyst on the right.  Preliminary results called to Dr. Barry Brunner office @ 11:45, given to [facility].   Everrett Coombe 10/11/2017, 11:47 AM

## 2017-10-15 ENCOUNTER — Encounter: Payer: Self-pay | Admitting: Radiology

## 2017-10-16 ENCOUNTER — Ambulatory Visit (INDEPENDENT_AMBULATORY_CARE_PROVIDER_SITE_OTHER): Payer: Medicare HMO | Admitting: Emergency Medicine

## 2017-10-16 ENCOUNTER — Ambulatory Visit: Payer: Self-pay | Admitting: *Deleted

## 2017-10-16 ENCOUNTER — Encounter: Payer: Self-pay | Admitting: Emergency Medicine

## 2017-10-16 ENCOUNTER — Other Ambulatory Visit: Payer: Self-pay

## 2017-10-16 VITALS — BP 130/70 | HR 75 | Temp 99.2°F | Resp 16 | Ht 65.25 in | Wt 229.8 lb

## 2017-10-16 DIAGNOSIS — G8929 Other chronic pain: Secondary | ICD-10-CM | POA: Diagnosis not present

## 2017-10-16 DIAGNOSIS — M05741 Rheumatoid arthritis with rheumatoid factor of right hand without organ or systems involvement: Secondary | ICD-10-CM | POA: Diagnosis not present

## 2017-10-16 DIAGNOSIS — M25561 Pain in right knee: Secondary | ICD-10-CM

## 2017-10-16 DIAGNOSIS — M129 Arthropathy, unspecified: Secondary | ICD-10-CM

## 2017-10-16 DIAGNOSIS — M05742 Rheumatoid arthritis with rheumatoid factor of left hand without organ or systems involvement: Secondary | ICD-10-CM | POA: Diagnosis not present

## 2017-10-16 MED ORDER — PREDNISONE 20 MG PO TABS: 40.0000 mg | ORAL_TABLET | Freq: Every day | ORAL | 0 refills | Status: AC

## 2017-10-16 MED ORDER — METHYLPREDNISOLONE ACETATE 80 MG/ML IJ SUSP
80.0000 mg | Freq: Once | INTRAMUSCULAR | Status: AC
  Administered 2017-10-16: 80 mg via INTRAMUSCULAR

## 2017-10-16 NOTE — Telephone Encounter (Signed)
C/o Pain and stiffness in his right knee, foot and hand.  He rode from a long time in a car yesterday cramped up and thinks this is what caused this.   He is in a lot of pain. I got an appt with Dr. Mitchel Honour this morning at 9:40am.   Reason for Disposition . Weakness (i.e., loss of strength) of new onset in hand or fingers  (Exceptions: not truly weak, hand feels weak because of pain; weakness present > 2 weeks)  Answer Assessment - Initial Assessment Questions 1. ONSET: "When did the pain start?"     Started really bad yesterday.   I was sitting in the back of a car about 2 hours in the back seat.   I wasn't able to stretch out. 2. LOCATION: "Where is the pain located?"     Pain right knee is swollen and hard to move, right foot hurting, right hand swollen and hurts.  I can't close my hand completely. 3. PAIN: "How bad is the pain?" (Scale 1-10; or mild, moderate, severe)   - MILD (1-3): doesn't interfere with normal activities   - MODERATE (4-7): interferes with normal activities (e.g., work or school) or awakens from sleep   - SEVERE (8-10): excruciating pain, unable to use hand at all     10.  Not able to use right hand at all.  It hurts real bad. 4. WORK OR EXERCISE: "Has there been any recent work or exercise that involved this part of the body?"     Just riding in the back of a car cramped up 5. CAUSE: "What do you think is causing the pain?"     The car ride and not being able to stretch ouit. 6. AGGRAVATING FACTORS: "What makes the pain worse?" (e.g., using computer)     When first got up my knee and foot was so sore I had to hold on to something to get to the bathroom.  It's better now after moving around but it's still sore in my right knee and foot. 7. OTHER SYMPTOMS: "Do you have any other symptoms?" (e.g., neck pain, swelling, rash, numbness, fever)     No 8. PREGNANCY: "Is there any chance you are pregnant?" "When was your last menstrual period?"     N/A  Protocols used:  HAND AND WRIST PAIN-A-AH

## 2017-10-16 NOTE — Progress Notes (Signed)
[name] 71 y.o.   Chief Complaint  Patient presents with  . hand swelling    per patient RIGHT started this morning  . Foot Swelling    RIGHT and knee all started this morning    HISTORY OF PRESENT ILLNESS: This is a 71 y.o. male complaining of pain and swelling to right hand, right knee, and right foot; has h/o RA; states in the past corticosteroids have helped a great deal and is requesting them. Last seen by me 11/29 for right leg pain; ultrasound showed no DVT.  HPI   Prior to Admission medications   Medication Sig Start Date End Date Taking? Authorizing Provider  diclofenac (VOLTAREN) 75 MG EC tablet Take 1 tablet (75 mg total) by mouth 2 (two) times daily as needed (for neck pain). 10/04/17  Yes Shawnee Knapp, MD  doxycycline (VIBRA-TABS) 100 MG tablet Take 1 tablet (100 mg total) by mouth 2 (two) times daily. 10/04/17  Yes Shawnee Knapp, MD  folic acid (FOLVITE) 1 MG tablet Take 1 mg by mouth daily.   Yes [provider]  gabapentin (NEURONTIN) 300 MG capsule Take 1 capsule (300 mg total) by mouth 3 (three) times daily. 08/14/17  Yes Stallings, Zoe A, MD  hydrochlorothiazide (HYDRODIURIL) 25 MG tablet TAKE 1 TABLET BY MOUTH ONCE DAILY 07/01/17  Yes Stallings, Zoe A, MD  methotrexate 250 MG/10ML injection Inject 0.6 mLs into the skin every 7 (seven) days.  12/09/15  Yes [provider]  metoprolol succinate (TOPROL-XL) 50 MG 24 hr tablet TAKE 1 TABLET BY MOUTH ONCE DAILY. TAKE WITH OR IMMEDIATELY FOLLOWING A MEAL 06/26/17  Yes Stallings, Zoe A, MD  mupirocin ointment (BACTROBAN) 2 % Apply 1 application topically 2 (two) times daily. After warm water epsom salt soak/sitz bath 10/04/17  Yes Shawnee Knapp, MD  primidone (MYSOLINE) 50 MG tablet Take 50 mg by mouth 2 (two) times daily.  04/05/13  Yes Darlyne Russian, MD  ranitidine (ZANTAC) 150 MG tablet Take 1 tablet (150 mg total) by mouth 2 (two) times daily. 05/06/17  Yes Jeffery, Chelle, PA-C  Tamsulosin HCl (FLOMAX)  0.4 MG CAPS Take 0.4 mg by mouth at bedtime.    Yes [provider]  traMADol (ULTRAM) 50 MG tablet Take 1 tablet (50 mg total) every 8 (eight) hours as needed by mouth for severe pain. 09/27/17  Yes Jeffery, Chelle, PA-C  acetaminophen (TYLENOL) 500 MG tablet Take 2 tablets (1,000 mg total) by mouth every 8 (eight) hours as needed for mild pain, moderate pain or fever. Take 2 tabs every 8 hours. Patient not taking: Reported on 10/10/2017 08/24/17 08/24/18  Tereasa Coop, PA-C  Adalimumab (HUMIRA PEN [place]) Inject into the skin.    [provider]  aspirin EC 81 MG tablet Take 81 mg by mouth daily.    [provider]  B-D TB SYRINGE 1CC/27GX1/2" 27G X 1/2" 1 ML MISC  02/22/17   [provider]  cyclobenzaprine (FLEXERIL) 5 MG tablet Take 0.5-1 tablets (2.5-5 mg total) by mouth 3 (three) times daily as needed for muscle spasms. Patient not taking: Reported on 10/16/2017 10/04/17   Shawnee Knapp, MD    Allergies  Allergen Reactions  . Lisinopril Cough  . Other Other (See Comments)    BLOOD PRODUCT REFUSAL (patient is a Jehovah's Witness)    Patient Active Problem List   Diagnosis Date Noted  . Arthralgia of right lower leg 10/10/2017  . Class 2 severe obesity  due to excess calories with serious comorbidity and body mass index (BMI) of 39.0 to 39.9 in adult ([place]) 03/07/2017  . ACE-inhibitor cough 03/07/2017  . Lumbago 06/21/2016  . Spinal stenosis, lumbar region, with neurogenic claudication 02/15/2015  . Obesity (BMI 30-39.9) 12/07/2013  . Refusal of blood transfusions as patient is Jehovah's Witness 12/07/2013  . Rheumatoid arthritis ([place]) 10/26/2012  . Nonspecific abnormal finding in stool contents 07/28/2012  . Multiple joint pain 06/05/2012  . History of tobacco use-  06/05/2012  . Asthmatic bronchitis 12/15/2011  . BPH (benign prostatic hyperplasia) 12/15/2011  . Essential tremor 12/15/2011  . Essential hypertension 12/15/2011    Past Medical  History:  Diagnosis Date  . Anemia   . Arthritis    hands, knees, cervical area. Back pain. Rheumatoid arthritis- weekly injections.  [name] BPH (benign prostatic hypertrophy)   . Essential tremor   . GERD (gastroesophageal reflux disease)    reports for indigestion he uses mustard   . Hearing deficit    wears hearing aids bilateral  . HTN (hypertension)   . Obesity   . Refusal of blood transfusions as patient is Jehovah's Witness   . Right bundle branch block    history of  . Seizures (HCC)    AS A CHILD. only esential tremors now.  . Sleep apnea    cpap - settings at 9 per patient   . Ulcer     Past Surgical History:  Procedure Laterality Date  . ANTERIOR CERVICAL DECOMP/DISCECTOMY FUSION N/A 03/30/2013   Procedure: ANTERIOR CERVICAL DECOMPRESSION/DISCECTOMY FUSION 2 LEVELS;  Surgeon: Otilio Connors, MD;  Location: [facility];  Service: Neurosurgery;  Laterality: N/A;  C4-5 C5-6 Anterior cervical decompression/diskectomy/fusion/Allograft/Plate  . CHOLECYSTECTOMY N/A 12/07/2013   Procedure: LAPAROSCOPIC CHOLECYSTECTOMY WITH INTRAOPERATIVE CHOLANGIOGRAM;  Surgeon: Adin Hector, MD;  Location: [facility];  Service: General;  Laterality: N/A;  . COLONOSCOPY    . DECOMPRESSIVE LUMBAR LAMINECTOMY LEVEL 2 N/A 02/15/2015   Procedure: COMPLETE DECOMPRESSIVE LUMBAR LAMINECTOMY L4-L5/ FORAMINOTOMY TO L4 NERVE ROOT AND L5 NERVE ROOT BILATERALLY;  Surgeon: [name], MD;  Location: [facility];  Service: Orthopedics;  Laterality: N/A;  . EYE SURGERY     right, growth excision  . LUMBAR LAMINECTOMY/DECOMPRESSION MICRODISCECTOMY Left 01/18/2016   Procedure:  DECOMPRESSION L4-L5 MICRODISCECTOMY L4-L5 ON LEFT FOR SPINAL STENOSIS;  Surgeon: [name], MD;  Location: [facility];  Service: Orthopedics;  Laterality: Left;  . SPINE SURGERY    . TONSILLECTOMY    . VASECTOMY    . WRIST GANGLION EXCISION Left     Social History   Socioeconomic History  . Marital status: Married    Spouse name: Not on  file  . Number of children: 2  . Years of education: Not on file  . Highest education level: Not on file  Social Needs  . Financial resource strain: Not on file  . Food insecurity - worry: Not on file  . Food insecurity - inability: Not on file  . Transportation needs - medical: Not on file  . Transportation needs - non-medical: Not on file  Occupational History  . Occupation: bus Education administrator: RETIRED  Tobacco Use  . Smoking status: Former Smoker    Types: Cigarettes    Last attempt to quit: 12/14/1972    Years since quitting: 44.8  . Smokeless tobacco: Never Used  Substance and Sexual Activity  . Alcohol use: Yes    Comment: occasionally  . Drug use: No  . Sexual  activity: Yes    Birth control/protection: None  Other Topics Concern  . Not on file  Social History Narrative  . Not on file    Family History  Problem Relation Age of Onset  . Colon cancer Maternal Grandfather   . Kidney disease Brother   . Stroke Maternal Grandmother   . Diabetes Paternal Aunt        x 4 aunts     Review of Systems  Constitutional: Negative.  Negative for chills and fever.  HENT: Negative.   Eyes: Negative.   Respiratory: Negative.  Negative for cough and shortness of breath.   Cardiovascular: Negative.  Negative for chest pain.  Gastrointestinal: Negative.  Negative for abdominal pain, diarrhea, nausea and vomiting.  Genitourinary: Negative.  Negative for dysuria and hematuria.  Musculoskeletal: Positive for joint pain.  Skin: Negative for rash.  Neurological: Negative for dizziness and headaches.  Endo/Heme/Allergies: Negative.   All other systems reviewed and are negative.   Vitals:   10/16/17 1009  BP: 130/70  Pulse: 75  Resp: 16  Temp: 99.2 F (37.3 C)  SpO2: 96%    Physical Exam  Constitutional: He is oriented to person, place, and time. He appears well-developed and well-nourished.  HENT:  Head: Normocephalic and atraumatic.  Eyes: EOM are normal. Pupils  are equal, round, and reactive to light.  Neck: Normal range of motion. Neck supple.  Cardiovascular: Normal rate and regular rhythm.  Pulmonary/Chest: Effort normal and breath sounds normal.  Abdominal: Soft. There is no tenderness.  Musculoskeletal:  Chronic RA deformities on hands; right knee: no eythema, swelling, or tenderness to palpation; right Foot: warm to touch with normal cap refill, mild swelling, non-tender, NVI.  Neurological: He is alert and oriented to person, place, and time.  Skin: Skin is warm and dry.  Vitals reviewed.    ASSESSMENT & PLAN: Greene was seen today for hand swelling and foot swelling.  Diagnoses and all orders for this visit:  Arthritis involving multiple sites -     methylPREDNISolone acetate (DEPO-MEDROL) injection 80 mg -     predniSONE (DELTASONE) 20 MG tablet; Take 2 tablets (40 mg total) by mouth daily with breakfast for 5 days.  Chronic pain of right knee  Rheumatoid arthritis involving both hands with positive rheumatoid factor ([place])    Patient Instructions       IF you received an x-ray today, you will receive an invoice from [facility]. Please contact [facility] at [phone] with questions or concerns regarding your invoice.   IF you received labwork today, you will receive an invoice from [facility]. Please contact [facility] at [phone] with questions or concerns regarding your invoice.   Our billing staff will not be able to assist you with questions regarding bills from these companies.  You will be contacted with the lab results as soon as they are available. The fastest way to get your results is to activate your My Chart account. Instructions are located on the last page of this paperwork. If you have not heard from [place] regarding the results in 2 weeks, please contact this office.     Rheumatoid Arthritis Rheumatoid arthritis (RA) is a long-term (chronic) disease. RA causes inflammation in your joints.  Your joints may feel painful, stiff, swollen, warm, or tender. RA may start slowly. Usually, it affects the small joints of the hands and feet. It can also affect other parts of the body, even the heart, eyes, or lungs. Symptoms of RA often come  and go. Sometimes, symptoms get worse for a while. These are called flares. There is no cure for RA, but your doctor will work with you to find the best treatment option for you. This will depend on how the disease is changing in your body. Follow these instructions at home:  Take over-the-counter and prescription medicines only as told by your doctor. Your doctor may change (adjust) your medicines every 3 months.  Start an exercise program as told by your doctor.  Rest when you have a flare.  Return to your normal activities as told by your doctor. Ask your doctor what activities are safe for you.  Keep all follow-up visits as told by your doctor. This is important. Contact a doctor if:  You have a flare.  You have a fever.  You have problems (side effects) because of your medicines. Get help right away if:  You have chest pain.  You have trouble breathing.  You have a hot, painful joint all of a sudden, and it is worse than your usual joint aches. This information is not intended to replace advice given to you by your health care provider. Make sure you discuss any questions you have with your health care provider. Document Released: 01/21/2012 Document Revised: 04/05/2016 Document Reviewed: 08/11/2015 Elsevier Interactive Patient Education  2018 Elsevier Inc.      Agustina Caroli, MD [facility]

## 2017-10-16 NOTE — Patient Instructions (Addendum)
     IF you received an x-ray today, you will receive an invoice from [facility]. Please contact [facility] at [phone] with questions or concerns regarding your invoice.   IF you received labwork today, you will receive an invoice from [facility]. Please contact [facility] at [phone] with questions or concerns regarding your invoice.   Our billing staff will not be able to assist you with questions regarding bills from these companies.  You will be contacted with the lab results as soon as they are available. The fastest way to get your results is to activate your My Chart account. Instructions are located on the last page of this paperwork. If you have not heard from [place] regarding the results in 2 weeks, please contact this office.     Rheumatoid Arthritis Rheumatoid arthritis (RA) is a long-term (chronic) disease. RA causes inflammation in your joints. Your joints may feel painful, stiff, swollen, warm, or tender. RA may start slowly. Usually, it affects the small joints of the hands and feet. It can also affect other parts of the body, even the heart, eyes, or lungs. Symptoms of RA often come and go. Sometimes, symptoms get worse for a while. These are called flares. There is no cure for RA, but your doctor will work with you to find the best treatment option for you. This will depend on how the disease is changing in your body. Follow these instructions at home:  Take over-the-counter and prescription medicines only as told by your doctor. Your doctor may change (adjust) your medicines every 3 months.  Start an exercise program as told by your doctor.  Rest when you have a flare.  Return to your normal activities as told by your doctor. Ask your doctor what activities are safe for you.  Keep all follow-up visits as told by your doctor. This is important. Contact a doctor if:  You have a flare.  You have a fever.  You have problems (side effects) because  of your medicines. Get help right away if:  You have chest pain.  You have trouble breathing.  You have a hot, painful joint all of a sudden, and it is worse than your usual joint aches. This information is not intended to replace advice given to you by your health care provider. Make sure you discuss any questions you have with your health care provider. Document Released: 01/21/2012 Document Revised: 04/05/2016 Document Reviewed: 08/11/2015 Elsevier Interactive Patient Education  [employer].

## 2017-10-17 ENCOUNTER — Telehealth: Payer: Self-pay | Admitting: Family Medicine

## 2017-10-17 DIAGNOSIS — R591 Generalized enlarged lymph nodes: Secondary | ICD-10-CM | POA: Diagnosis not present

## 2017-10-17 DIAGNOSIS — E669 Obesity, unspecified: Secondary | ICD-10-CM | POA: Diagnosis not present

## 2017-10-17 DIAGNOSIS — M2550 Pain in unspecified joint: Secondary | ICD-10-CM | POA: Diagnosis not present

## 2017-10-17 DIAGNOSIS — I7300 Raynaud's syndrome without gangrene: Secondary | ICD-10-CM | POA: Diagnosis not present

## 2017-10-17 DIAGNOSIS — M0579 Rheumatoid arthritis with rheumatoid factor of multiple sites without organ or systems involvement: Secondary | ICD-10-CM | POA: Diagnosis not present

## 2017-10-17 DIAGNOSIS — M5416 Radiculopathy, lumbar region: Secondary | ICD-10-CM | POA: Diagnosis not present

## 2017-10-17 DIAGNOSIS — Z6838 Body mass index (BMI) 38.0-38.9, adult: Secondary | ICD-10-CM | POA: Diagnosis not present

## 2017-10-17 NOTE — Telephone Encounter (Signed)
Copied from [place] [id number]. Topic: General - Other >> Oct 17, 2017 10:31 AM Darl Householder, RMA wrote: Reason for CRM: Amy from Dr. Melissa Noon office and is requesting office notes from yesterday 10/17/17 with dr. Mitchel Honour, please faxed note to [phone] Attn: Amy

## 2017-10-19 ENCOUNTER — Other Ambulatory Visit: Payer: Self-pay | Admitting: Family Medicine

## 2017-10-23 DIAGNOSIS — M2550 Pain in unspecified joint: Secondary | ICD-10-CM | POA: Diagnosis not present

## 2017-10-23 DIAGNOSIS — E669 Obesity, unspecified: Secondary | ICD-10-CM | POA: Diagnosis not present

## 2017-10-23 DIAGNOSIS — M5416 Radiculopathy, lumbar region: Secondary | ICD-10-CM | POA: Diagnosis not present

## 2017-10-23 DIAGNOSIS — R591 Generalized enlarged lymph nodes: Secondary | ICD-10-CM | POA: Diagnosis not present

## 2017-10-23 DIAGNOSIS — M0579 Rheumatoid arthritis with rheumatoid factor of multiple sites without organ or systems involvement: Secondary | ICD-10-CM | POA: Diagnosis not present

## 2017-10-23 DIAGNOSIS — I7300 Raynaud's syndrome without gangrene: Secondary | ICD-10-CM | POA: Diagnosis not present

## 2017-10-23 DIAGNOSIS — Z6838 Body mass index (BMI) 38.0-38.9, adult: Secondary | ICD-10-CM | POA: Diagnosis not present

## 2017-10-28 DIAGNOSIS — K119 Disease of salivary gland, unspecified: Secondary | ICD-10-CM | POA: Diagnosis not present

## 2017-10-28 DIAGNOSIS — K118 Other diseases of salivary glands: Secondary | ICD-10-CM | POA: Insufficient documentation

## 2017-10-31 ENCOUNTER — Encounter: Payer: Self-pay | Admitting: Physician Assistant

## 2017-11-08 ENCOUNTER — Ambulatory Visit
Admission: RE | Admit: 2017-11-08 | Discharge: 2017-11-08 | Disposition: A | Discharge status: Home / Self Care | Payer: Medicare HMO | Source: Ambulatory Visit | Attending: Otolaryngology | Admitting: Otolaryngology

## 2017-11-08 ENCOUNTER — Other Ambulatory Visit: Payer: Self-pay | Admitting: Otolaryngology

## 2017-11-08 DIAGNOSIS — R221 Localized swelling, mass and lump, neck: Secondary | ICD-10-CM | POA: Diagnosis not present

## 2017-11-08 DIAGNOSIS — K118 Other diseases of salivary glands: Secondary | ICD-10-CM

## 2017-11-08 MED ORDER — IOPAMIDOL (ISOVUE-300) INJECTION 61%
75.0000 mL | Freq: Once | INTRAVENOUS | Status: AC | PRN
  Administered 2017-11-08: 75 mL via INTRAVENOUS

## 2017-11-11 ENCOUNTER — Other Ambulatory Visit: Payer: Self-pay | Admitting: Otolaryngology

## 2017-11-11 ENCOUNTER — Ambulatory Visit
Admission: RE | Admit: 2017-11-11 | Discharge: 2017-11-11 | Disposition: A | Discharge status: Home / Self Care | Payer: Medicare HMO | Source: Ambulatory Visit | Attending: Otolaryngology | Admitting: Otolaryngology

## 2017-11-11 DIAGNOSIS — K118 Other diseases of salivary glands: Secondary | ICD-10-CM

## 2017-11-11 DIAGNOSIS — R221 Localized swelling, mass and lump, neck: Secondary | ICD-10-CM | POA: Diagnosis not present

## 2017-11-19 ENCOUNTER — Other Ambulatory Visit: Payer: Self-pay | Admitting: Otolaryngology

## 2017-11-19 DIAGNOSIS — K118 Other diseases of salivary glands: Secondary | ICD-10-CM

## 2017-11-25 DIAGNOSIS — R69 Illness, unspecified: Secondary | ICD-10-CM | POA: Diagnosis not present

## 2017-11-29 ENCOUNTER — Other Ambulatory Visit: Payer: Self-pay | Admitting: Radiology

## 2017-11-30 ENCOUNTER — Other Ambulatory Visit: Payer: Self-pay | Admitting: Family Medicine

## 2017-12-02 ENCOUNTER — Other Ambulatory Visit: Payer: Self-pay | Admitting: General Surgery

## 2017-12-02 NOTE — Telephone Encounter (Signed)
Medication refill. Last office visit 10/16/17. Thanks.

## 2017-12-02 NOTE — Telephone Encounter (Signed)
Refill  Request for gabapentin 300 mg one po tid #90 with no refills approved. Dgaddy, CMA

## 2017-12-03 ENCOUNTER — Encounter (HOSPITAL_COMMUNITY): Payer: Self-pay

## 2017-12-03 ENCOUNTER — Ambulatory Visit (HOSPITAL_COMMUNITY)
Admission: RE | Admit: 2017-12-03 | Discharge: 2017-12-03 | Disposition: A | Discharge status: Home / Self Care | Payer: Medicare HMO | Source: Ambulatory Visit | Attending: Otolaryngology | Admitting: Otolaryngology

## 2017-12-03 DIAGNOSIS — K118 Other diseases of salivary glands: Secondary | ICD-10-CM

## 2017-12-03 DIAGNOSIS — K119 Disease of salivary gland, unspecified: Secondary | ICD-10-CM | POA: Insufficient documentation

## 2017-12-03 DIAGNOSIS — R590 Localized enlarged lymph nodes: Secondary | ICD-10-CM | POA: Diagnosis not present

## 2017-12-03 LAB — CBC
HCT: 42.5 % (ref 39.0–52.0)
Hemoglobin: 14.2 g/dL (ref 13.0–17.0)
MCH: 34.1 pg — ABNORMAL HIGH (ref 26.0–34.0)
MCHC: 33.4 g/dL (ref 30.0–36.0)
MCV: 101.9 fL — AB (ref 78.0–100.0)
PLATELETS: 203 10*3/uL (ref 150–400)
RBC: 4.17 MIL/uL — ABNORMAL LOW (ref 4.22–5.81)
RDW: 14.9 % (ref 11.5–15.5)
WBC: 4.2 10*3/uL (ref 4.0–10.5)

## 2017-12-03 LAB — PROTIME-INR
INR: 0.99
PROTHROMBIN TIME: 13 s (ref 11.4–15.2)

## 2017-12-03 LAB — APTT: aPTT: 25 seconds (ref 24–36)

## 2017-12-03 MED ORDER — FENTANYL CITRATE (PF) 100 MCG/2ML IJ SOLN
INTRAMUSCULAR | Status: AC
  Filled 2017-12-03: qty 2

## 2017-12-03 MED ORDER — FENTANYL CITRATE (PF) 100 MCG/2ML IJ SOLN
INTRAMUSCULAR | Status: AC | PRN
  Administered 2017-12-03: 50 ug via INTRAVENOUS

## 2017-12-03 MED ORDER — SODIUM CHLORIDE 0.9 % IV SOLN
INTRAVENOUS | Status: AC | PRN
  Administered 2017-12-03: 10 mL/h via INTRAVENOUS

## 2017-12-03 MED ORDER — SODIUM CHLORIDE 0.9 % IV SOLN: INTRAVENOUS | Status: DC

## 2017-12-03 MED ORDER — MIDAZOLAM HCL 2 MG/2ML IJ SOLN
INTRAMUSCULAR | Status: AC | PRN
  Administered 2017-12-03: 1 mg via INTRAVENOUS

## 2017-12-03 MED ORDER — MIDAZOLAM HCL 2 MG/2ML IJ SOLN
INTRAMUSCULAR | Status: AC
  Filled 2017-12-03: qty 2

## 2017-12-03 NOTE — Sedation Documentation (Signed)
Patient is resting comfortably. 

## 2017-12-03 NOTE — Discharge Instructions (Signed)

## 2017-12-03 NOTE — Procedures (Signed)
L parotid mass Bx 18 g core times two EBL 0 Comp 0

## 2017-12-03 NOTE — H&P (Signed)
Chief Complaint: Patient was seen in consultation today for left parotid nodule vs left neck lymphadenopathy biopsy at the request of Helayne Seminole  Referring Physician(s): Helayne Seminole  Supervising Physician: Marybelle Killings  Patient Status: [facility] - Out-pt  History of Present Illness: Bryan [name] is a 72 y.o. male   Hx Rh arthritis Last dose Humira 5months ago Achy/tender joints  Pt noted left neck nodule 6 mo ago Prednisone did relieve some swelling But then recurred Antibiotics no help  CT 11/08/17: IMPRESSION: Multifocal left parotid lesion. Multiple ill-defined enhancing nodules in the left parotid gland. Asymmetric enhancing lymph nodes in the left neck are not pathologically enlarged however given the asymmetry and the left parotid lesion, they could represent neoplastic spread of parotid malignancy or lymphoma. Tissue sampling recommended.  [place] 11/11/17: IMPRESSION: Heterogeneous echogenicity of the left parotid parenchyma. A single discrete nodule is identified by ultrasound but the other nodules seen on CT are not clearly evident. Left-sided cervical lymph nodes identified, as seen on recent CT.  Scheduled now for LAN biopsy vs parotid nodule biopsy   Past Medical History:  Diagnosis Date  . Anemia   . Arthritis    hands, knees, cervical area. Back pain. Rheumatoid arthritis- weekly injections.  [name] BPH (benign prostatic hypertrophy)   . Essential tremor   . GERD (gastroesophageal reflux disease)    reports for indigestion he uses mustard   . Hearing deficit    wears hearing aids bilateral  . HTN (hypertension)   . Obesity   . Refusal of blood transfusions as patient is Jehovah's Witness   . Right bundle branch block    history of  . Seizures (HCC)    AS A CHILD. only esential tremors now.  . Sleep apnea    cpap - settings at 9 per patient   . Ulcer     Past Surgical History:  Procedure Laterality Date  . ANTERIOR CERVICAL  DECOMP/DISCECTOMY FUSION N/A 03/30/2013   Procedure: ANTERIOR CERVICAL DECOMPRESSION/DISCECTOMY FUSION 2 LEVELS;  Surgeon: Otilio Connors, MD;  Location: [facility];  Service: Neurosurgery;  Laterality: N/A;  C4-5 C5-6 Anterior cervical decompression/diskectomy/fusion/Allograft/Plate  . CHOLECYSTECTOMY N/A 12/07/2013   Procedure: LAPAROSCOPIC CHOLECYSTECTOMY WITH INTRAOPERATIVE CHOLANGIOGRAM;  Surgeon: Adin Hector, MD;  Location: [facility];  Service: General;  Laterality: N/A;  . COLONOSCOPY    . DECOMPRESSIVE LUMBAR LAMINECTOMY LEVEL 2 N/A 02/15/2015   Procedure: COMPLETE DECOMPRESSIVE LUMBAR LAMINECTOMY L4-L5/ FORAMINOTOMY TO L4 NERVE ROOT AND L5 NERVE ROOT BILATERALLY;  Surgeon: [name], MD;  Location: [facility];  Service: Orthopedics;  Laterality: N/A;  . EYE SURGERY     right, growth excision  . LUMBAR LAMINECTOMY/DECOMPRESSION MICRODISCECTOMY Left 01/18/2016   Procedure:  DECOMPRESSION L4-L5 MICRODISCECTOMY L4-L5 ON LEFT FOR SPINAL STENOSIS;  Surgeon: [name], MD;  Location: [facility];  Service: Orthopedics;  Laterality: Left;  . SPINE SURGERY    . TONSILLECTOMY    . VASECTOMY    . WRIST GANGLION EXCISION Left     Allergies: Lisinopril and Other  Medications: Prior to Admission medications   Medication Sig Start Date End Date Taking? Authorizing Provider  acetaminophen (TYLENOL) 500 MG tablet Take 2 tablets (1,000 mg total) by mouth every 8 (eight) hours as needed for mild pain, moderate pain or fever. Take 2 tabs every 8 hours. Patient taking differently: Take 1,000 mg by mouth every 8 (eight) hours as needed for mild pain, moderate pain or fever.  08/24/17 08/24/18 Yes Tereasa Coop, PA-C  amLODipine (NORVASC) 10 MG tablet Take 10 mg by mouth daily.   Yes [provider]  aspirin EC 81 MG tablet Take 81 mg by mouth daily.   Yes [provider]  folic acid (FOLVITE) 1 MG tablet Take 1 mg by mouth daily.   Yes [provider]  gabapentin  (NEURONTIN) 300 MG capsule TAKE 1 CAPSULE BY MOUTH THREE TIMES DAILY Patient taking differently: Take 300 mg by mouth twice daily 12/02/17  Yes Stallings, Zoe A, MD  hydrochlorothiazide (HYDRODIURIL) 25 MG tablet TAKE 1 TABLET BY MOUTH ONCE DAILY Patient taking differently: TAKE 25 MG BY MOUTH ONCE DAILY 07/01/17  Yes Delia Chimes A, MD  methotrexate 250 MG/10ML injection Inject 15 mg into the skin every Thursday.  12/09/15  Yes [provider]  metoprolol succinate (TOPROL-XL) 50 MG 24 hr tablet TAKE 1 TABLET BY MOUTH ONCE DAILY. TAKE WITH OR IMMEDIATELY FOLLOWING A MEAL Patient taking differently: TAKE 50 MG BY MOUTH ONCE DAILY. TAKE WITH OR IMMEDIATELY FOLLOWING A MEAL 06/26/17  Yes Stallings, Zoe A, MD  predniSONE (DELTASONE) 5 MG tablet Take 5 mg by mouth daily with breakfast.   Yes [provider]  primidone (MYSOLINE) 50 MG tablet Take 50 mg by mouth 2 (two) times daily.  04/05/13  Yes Darlyne Russian, MD  ranitidine (ZANTAC) 150 MG tablet Take 1 tablet (150 mg total) by mouth 2 (two) times daily. Patient taking differently: Take 150 mg by mouth daily.  05/06/17  Yes Jeffery, Chelle, PA-C  Tamsulosin HCl (FLOMAX) 0.4 MG CAPS Take 0.4 mg by mouth at bedtime.    Yes [provider]  traMADol (ULTRAM) 50 MG tablet Take 1 tablet (50 mg total) every 8 (eight) hours as needed by mouth for severe pain. 09/27/17  Yes Jeffery, Chelle, PA-C  cyclobenzaprine (FLEXERIL) 5 MG tablet Take 0.5-1 tablets (2.5-5 mg total) by mouth 3 (three) times daily as needed for muscle spasms. Patient not taking: Reported on 10/16/2017 10/04/17   Shawnee Knapp, MD  diclofenac (VOLTAREN) 75 MG EC tablet Take 1 tablet (75 mg total) by mouth 2 (two) times daily as needed (for neck pain). Patient not taking: Reported on 12/02/2017 10/04/17   Shawnee Knapp, MD  mupirocin ointment (BACTROBAN) 2 % Apply 1 application topically 2 (two) times daily. After warm water epsom salt soak/sitz bath Patient not taking:  Reported on 12/02/2017 10/04/17   Shawnee Knapp, MD     Family History  Problem Relation Age of Onset  . Colon cancer Maternal Grandfather   . Kidney disease Brother   . Stroke Maternal Grandmother   . Diabetes Paternal Aunt        x 4 aunts    Social History   Socioeconomic History  . Marital status: Married    Spouse name: None  . Number of children: 2  . Years of education: None  . Highest education level: None  Social Needs  . Financial resource strain: None  . Food insecurity - worry: None  . Food insecurity - inability: None  . Transportation needs - medical: None  . Transportation needs - non-medical: None  Occupational History  . Occupation: bus Education administrator: RETIRED  Tobacco Use  . Smoking status: Former Smoker    Types: Cigarettes    Last attempt to quit: 12/14/1972    Years since quitting: 45.0  . Smokeless tobacco: Never Used  Substance and Sexual Activity  . Alcohol use: Yes  Comment: occasionally  . Drug use: No  . Sexual activity: Yes    Birth control/protection: None  Other Topics Concern  . None  Social History Narrative  . None    Review of Systems: A 12 point ROS discussed and pertinent positives are indicated in the HPI above.  All other systems are negative.  Review of Systems  Constitutional: Negative for activity change, fatigue, fever and unexpected weight change.  Respiratory: Negative for cough and shortness of breath.   Cardiovascular: Negative for chest pain.  Gastrointestinal: Negative for abdominal pain.  Psychiatric/Behavioral: Negative for behavioral problems and confusion.    Vital Signs: BP (!) 156/76   Pulse 69   Temp 98.1 F (36.7 C)   Resp 18   Ht 5\' 5"  (1.651 m)   Wt 220 lb (99.8 kg)   SpO2 99%   BMI 36.61 kg/m   Physical Exam  Constitutional: He is oriented to person, place, and time.  Cardiovascular: Normal rate, regular rhythm and normal heart sounds.  Pulmonary/Chest: Effort normal and breath sounds  normal.  Abdominal: Soft. Bowel sounds are normal.  Musculoskeletal: Normal range of motion.  Neurological: He is alert and oriented to person, place, and time.  Skin: Skin is warm and dry.  Psychiatric: He has a normal mood and affect. His behavior is normal. Judgment and thought content normal.  Nursing note and vitals reviewed.   Imaging: Ct Soft Tissue Neck W Contrast  Result Date: 11/08/2017 CLINICAL DATA:  Parotid mass Creatinine was obtained on site at [place] at [address]Results: Creatinine to 1.1 mg/dL. EXAM: CT NECK WITH CONTRAST TECHNIQUE: Multidetector CT imaging of the neck was performed using the standard protocol following the bolus administration of intravenous contrast. CONTRAST:  55mL ISOVUE-300 IOPAMIDOL (ISOVUE-300) INJECTION 61% COMPARISON:  Ultrasound 06/10/2017 FINDINGS: Pharynx and larynx: Normal. No mass or swelling. Salivary glands: Multifocal enhancing solid lesions in the left parotid gland. These are located in the superficial lobe. The most superior nodule is 12 mm with ill-defined margins. Larger lesion inferiorly and laterally measures 21 x 10 mm. Left parotid tail lesion 6.6 mm. No calcifications. Right parotid normal.  Submandibular glands normal bilaterally. Thyroid: Negative Lymph nodes: Multiple enhancing lymph nodes in the left neck are asymmetric. Multiple posterior lymph nodes are present on the left measuring under 1 cm. 10 mm left level 3 lymph node. No suspicious lymph nodes on the right. Given the left parotid lesion, these lymph nodes on the left could represent neoplasm spread. Vascular: Negative Limited intracranial: Negative Visualized orbits: Negative Mastoids and visualized paranasal sinuses: Negative Skeleton: ACDF C4-5 and C5-6.  No acute skeletal abnormality. Upper chest: Negative Other: None IMPRESSION: Multifocal left parotid lesion. Multiple ill-defined enhancing nodules in the left parotid gland. Asymmetric enhancing lymph  nodes in the left neck are not pathologically enlarged however given the asymmetry and the left parotid lesion, they could represent neoplastic spread of parotid malignancy or lymphoma. Tissue sampling recommended. Electronically Signed   By: Franchot Gallo M.D.   On: 11/08/2017 15:59   US Soft Tissue Neck  Result Date: 11/11/2017 CLINICAL DATA:  Parotid mass seen on CT scan. EXAM: ULTRASOUND OF HEAD/NECK SOFT TISSUES TECHNIQUE: Ultrasound examination of the head and neck soft tissues was performed in the area of clinical concern. COMPARISON:  CT scan 11/08/2017 FINDINGS: The left parotid parenchyma is quite heterogeneous in echogenicity. Only a single discrete heterogeneous hypoechoic nodule is identified. Revealing the recent CT scan, the nodules show differential enhancement making  them were visible. This is not well demonstrated by ultrasound. Multiple cervical lymph nodes are identified as documented on recent CT. IMPRESSION: Heterogeneous echogenicity of the left parotid parenchyma. A single discrete nodule is identified by ultrasound but the other nodules seen on CT are not clearly evident. Left-sided cervical lymph nodes identified, as seen on recent CT. Electronically Signed   By: Misty Stanley M.D.   On: 11/11/2017 15:53    Labs:  CBC: Recent Labs    12/28/16 1039 05/06/17 1119 06/10/17 1540 12/03/17 1051  WBC 7.1 3.9* 3.5* 4.2  HGB 12.3* 14.5 13.8* 14.2  HCT 36.1* 42.7* 40.8* 42.5  PLT  --   --   --  203    COAGS: Recent Labs    12/03/17 1051  INR 0.99  APTT 25    BMP: No results for input(s): NA, K, CL, CO2, GLUCOSE, BUN, CALCIUM, CREATININE, GFRNONAA, GFRAA in the last 8760 hours.  Invalid input(s): CMP  LIVER FUNCTION TESTS: No results for input(s): BILITOT, AST, ALT, ALKPHOS, PROT, ALBUMIN in the last 8760 hours.  TUMOR MARKERS: No results for input(s): AFPTM, CEA, CA199, CHROMGRNA in the last 8760 hours.  Assessment and Plan:  Left neck LAN and Left parotid  gland nodule Enlarging per imaging Now scheduled for biopsy Risks and benefits discussed with the patient including, but not limited to bleeding, infection, damage to adjacent structures or low yield requiring additional tests. All of the patient's questions were answered, patient is agreeable to proceed. Consent signed and in chart.   Thank you for this interesting consult.  I greatly enjoyed meeting JAECOB LOWDEN and look forward to participating in their care.  A copy of this report was sent to the requesting provider on this date.  Electronically Signed: Lavonia Drafts, PA-C 12/03/2017, 12:51 PM   I spent a total of  30 Minutes   in face to face in clinical consultation, greater than 50% of which was counseling/coordinating care for Left neck LAN vs Left parotid nodule bx

## 2017-12-09 DIAGNOSIS — M0579 Rheumatoid arthritis with rheumatoid factor of multiple sites without organ or systems involvement: Secondary | ICD-10-CM | POA: Diagnosis not present

## 2017-12-12 ENCOUNTER — Telehealth: Payer: Self-pay | Admitting: Hematology

## 2017-12-12 ENCOUNTER — Encounter: Payer: Self-pay | Admitting: Hematology

## 2017-12-12 NOTE — Telephone Encounter (Signed)
Appt has been scheduled for the pt to see Dr. Irene Limbo on 2/7 at 1pm. Pt aware to arrive 30 minutes early. Address verified. Letter mailed.

## 2017-12-13 ENCOUNTER — Encounter (HOSPITAL_COMMUNITY): Payer: Self-pay

## 2017-12-18 ENCOUNTER — Ambulatory Visit: Payer: Medicare HMO | Admitting: Family Medicine

## 2017-12-18 NOTE — Progress Notes (Deleted)
No chief complaint on file.   HPI  4 review of systems  Past Medical History:  Diagnosis Date  . Anemia   . Arthritis    hands, knees, cervical area. Back pain. Rheumatoid arthritis- weekly injections.  [name] BPH (benign prostatic hypertrophy)   . Essential tremor   . GERD (gastroesophageal reflux disease)    reports for indigestion he uses mustard   . Hearing deficit    wears hearing aids bilateral  . HTN (hypertension)   . Obesity   . Refusal of blood transfusions as patient is Jehovah's Witness   . Right bundle branch block    history of  . Seizures (HCC)    AS A CHILD. only esential tremors now.  . Sleep apnea    cpap - settings at 9 per patient   . Ulcer     Current Outpatient Medications  Medication Sig Dispense Refill  . acetaminophen (TYLENOL) 500 MG tablet Take 2 tablets (1,000 mg total) by mouth every 8 (eight) hours as needed for mild pain, moderate pain or fever. Take 2 tabs every 8 hours. (Patient taking differently: Take 1,000 mg by mouth every 8 (eight) hours as needed for mild pain, moderate pain or fever. ) 90 tablet prn  . amLODipine (NORVASC) 10 MG tablet Take 10 mg by mouth daily.    [name] aspirin EC 81 MG tablet Take 81 mg by mouth daily.    . folic acid (FOLVITE) 1 MG tablet Take 1 mg by mouth daily.    [name] gabapentin (NEURONTIN) 300 MG capsule TAKE 1 CAPSULE BY MOUTH THREE TIMES DAILY (Patient taking differently: Take 300 mg by mouth twice daily) 90 capsule 0  . hydrochlorothiazide (HYDRODIURIL) 25 MG tablet TAKE 1 TABLET BY MOUTH ONCE DAILY (Patient taking differently: TAKE 25 MG BY MOUTH ONCE DAILY) 30 tablet 6  . methotrexate 250 MG/10ML injection Inject 15 mg into the skin every Thursday.     . metoprolol succinate (TOPROL-XL) 50 MG 24 hr tablet TAKE 1 TABLET BY MOUTH ONCE DAILY. TAKE WITH OR IMMEDIATELY FOLLOWING A MEAL (Patient taking differently: TAKE 50 MG BY MOUTH ONCE DAILY. TAKE WITH OR IMMEDIATELY FOLLOWING A MEAL) 90 tablet 3  . predniSONE  (DELTASONE) 5 MG tablet Take 5 mg by mouth daily with breakfast.    . primidone (MYSOLINE) 50 MG tablet Take 50 mg by mouth 2 (two) times daily.     . ranitidine (ZANTAC) 150 MG tablet Take 1 tablet (150 mg total) by mouth 2 (two) times daily. (Patient taking differently: Take 150 mg by mouth daily. ) 60 tablet 0  . Tamsulosin HCl (FLOMAX) 0.4 MG CAPS Take 0.4 mg by mouth at bedtime.     . traMADol (ULTRAM) 50 MG tablet Take 1 tablet (50 mg total) every 8 (eight) hours as needed by mouth for severe pain. 30 tablet 0   No current facility-administered medications for this visit.     Allergies:  Allergies  Allergen Reactions  . Lisinopril Cough  . Other Other (See Comments)    BLOOD PRODUCT REFUSAL (patient is a Jehovah's Witness)    Past Surgical History:  Procedure Laterality Date  . ANTERIOR CERVICAL DECOMP/DISCECTOMY FUSION N/A 03/30/2013   Procedure: ANTERIOR CERVICAL DECOMPRESSION/DISCECTOMY FUSION 2 LEVELS;  Surgeon: Otilio Connors, MD;  Location: [facility];  Service: Neurosurgery;  Laterality: N/A;  C4-5 C5-6 Anterior cervical decompression/diskectomy/fusion/Allograft/Plate  . CHOLECYSTECTOMY N/A 12/07/2013   Procedure: LAPAROSCOPIC CHOLECYSTECTOMY WITH INTRAOPERATIVE CHOLANGIOGRAM;  Surgeon: Adin Hector, MD;  Location: [facility];  Service: General;  Laterality: N/A;  . COLONOSCOPY    . DECOMPRESSIVE LUMBAR LAMINECTOMY LEVEL 2 N/A 02/15/2015   Procedure: COMPLETE DECOMPRESSIVE LUMBAR LAMINECTOMY L4-L5/ FORAMINOTOMY TO L4 NERVE ROOT AND L5 NERVE ROOT BILATERALLY;  Surgeon: [name], MD;  Location: [facility];  Service: Orthopedics;  Laterality: N/A;  . EYE SURGERY     right, growth excision  . LUMBAR LAMINECTOMY/DECOMPRESSION MICRODISCECTOMY Left 01/18/2016   Procedure:  DECOMPRESSION L4-L5 MICRODISCECTOMY L4-L5 ON LEFT FOR SPINAL STENOSIS;  Surgeon: [name], MD;  Location: [facility];  Service: Orthopedics;  Laterality: Left;  . SPINE SURGERY    . TONSILLECTOMY    . VASECTOMY     . WRIST GANGLION EXCISION Left     Social History   Socioeconomic History  . Marital status: Married    Spouse name: Not on file  . Number of children: 2  . Years of education: Not on file  . Highest education level: Not on file  Social Needs  . Financial resource strain: Not on file  . Food insecurity - worry: Not on file  . Food insecurity - inability: Not on file  . Transportation needs - medical: Not on file  . Transportation needs - non-medical: Not on file  Occupational History  . Occupation: bus Education administrator: RETIRED  Tobacco Use  . Smoking status: Former Smoker    Types: Cigarettes    Last attempt to quit: 12/14/1972    Years since quitting: 45.0  . Smokeless tobacco: Never Used  Substance and Sexual Activity  . Alcohol use: Yes    Comment: occasionally  . Drug use: No  . Sexual activity: Yes    Birth control/protection: None  Other Topics Concern  . Not on file  Social History Narrative  . Not on file    Family History  Problem Relation Age of Onset  . Colon cancer Maternal Grandfather   . Kidney disease Brother   . Stroke Maternal Grandmother   . Diabetes Paternal Aunt        x 4 aunts     [name] See HPI Constitution: No fevers or chills No malaise No diaphoresis Skin: No rash or itching Eyes: no blurry vision, no double vision GU: no dysuria or hematuria Neuro: no dizziness or headaches * all others reviewed and negative   Objective: [name]  Physical Exam  Assessment and Plan There are no diagnoses linked to this encounter.   Bryan [name]

## 2017-12-18 NOTE — Progress Notes (Addendum)
HEMATOLOGY ONCOLOGY CONSULT NOTE  Patient Care Team: [name], MD as PCP - General (Internal Medicine) [name] as Physician Assistant (Emergency Medicine) [name], MD as Consulting Physician (Orthopedic Surgery)  CHIEF COMPLAINTS/PURPOSE OF CONSULTATION:  MALT lymphoma  HISTORY OF PRESENTING ILLNESS:   Bryan [name] 72 y.o. male is here because of a referral from ENT [name] regarding a concern of MALT lymphoma.   He is accompanied today by 4 members of his family. The pt reports that he is doing well overall. The pt's PCP is [name]. The pt sees [name] for his Rheumatology. The pt went off of humira a month before his mass appeared two months. The pt takes Methotrexate 15 mg once each week for his rheumatoid arthritis. He notes that he took humira for 8-10 months, and hasn't taken it in about 3 months. He began taking prednisone 33m q day, 2 months ago.   He reports having high blood pressure and takes amlodipine, metoprolol for it. He reports having had back and neck surgery for discs.  He also notes having essential tremors in his hands for which he takes primidone.  He reports having run out of flomax. He has had some PTSD from [place]  He notes he has had drenching night sweats for the last 15 years that are of intermittent frequency. He denies any other medical issues.    He first noticed swelling of his left cheek about two months ago. He notes that it was "hard like a rock." He was placed on prednisone for about a month after going to the doctor, with some relief. He then had his needle Bx recorded below.   He reports gum pain that began this morning, and also reports that his tongue feels as if it has bumps on it. He reports taking folic acid 173meach day.    On 10/29/17 the pt had a neck CT revealing Multifocal left parotid lesion. Multiple ill-defined enhancing nodules in the left parotid gland.  Asymmetric enhancing lymph nodes in the left neck are not pathologically enlarged however given the asymmetry and the left parotid lesion, they could represent neoplastic spread of parotid malignancy or lymphoma. Tissue sampling recommended.  Of note prior to the patient's visit, pt has had a biopsy of his left parotid gland completed on 12/03/17 with results revealing Atypical lymphoid proliferation. 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 -   B cell clonality study was positive for clonality.   On review of systems, pt reports gum pain, fatigue (not recent), occasional night sweats, ankle pain, and denies abdominal pains, back pain, flank pain, leg swelling, swollen or painful joints beside ankles.   On PMHx the pt has had rheumatoid arthritis for 6 years and HTN. He notes that he has anemia and is a JeAcupuncturistitness I- which he notes means he would not want to consider blood products  On Surgical Hx the pt had back surgery. On Social Hx the pt quit smoking in 1976 after smoking about 8 cigarettes each day for 15 years. He notes drinking ETOH about once a week. He denies chemical or radiation exposure, except for agent orange.  On Family Hx the pt notes high blood pressure, DM, but denies autoimmune conditions, cancers, or blood disorders.    MEDICAL HISTORY:  Past Medical History:  Diagnosis Date  . Anemia   . Arthritis    hands, knees, cervical area. Back pain. Rheumatoid arthritis- weekly injections.  [name] BPH (benign prostatic hypertrophy)   . Essential tremor   . GERD (gastroesophageal reflux disease)    reports for indigestion he uses mustard   . Hearing deficit    wears hearing aids bilateral  . HTN (hypertension)   . Obesity   . Refusal of blood transfusions as patient is Jehovah's Witness   . Right bundle branch block    history of  .  Seizures (HCC)    AS A CHILD. only esential tremors now.  . Sleep apnea    cpap - settings at 9 per patient   . Ulcer   JEHOVA's WITNESS  SURGICAL HISTORY: Past Surgical History:  Procedure Laterality Date  . ANTERIOR CERVICAL DECOMP/DISCECTOMY FUSION N/A 03/30/2013   Procedure: ANTERIOR CERVICAL DECOMPRESSION/DISCECTOMY FUSION 2 LEVELS;  Surgeon: Otilio Connors, MD;  Location: [facility];  Service: Neurosurgery;  Laterality: N/A;  C4-5 C5-6 Anterior cervical decompression/diskectomy/fusion/Allograft/Plate  . CHOLECYSTECTOMY N/A 12/07/2013   Procedure: LAPAROSCOPIC CHOLECYSTECTOMY WITH INTRAOPERATIVE CHOLANGIOGRAM;  Surgeon: Adin Hector, MD;  Location: [facility];  Service: General;  Laterality: N/A;  . COLONOSCOPY    . DECOMPRESSIVE LUMBAR LAMINECTOMY LEVEL 2 N/A 02/15/2015   Procedure: COMPLETE DECOMPRESSIVE LUMBAR LAMINECTOMY L4-L5/ FORAMINOTOMY TO L4 NERVE ROOT AND L5 NERVE ROOT BILATERALLY;  Surgeon: [name], MD;  Location: [facility];  Service: Orthopedics;  Laterality: N/A;  . EYE SURGERY     right, growth excision  . LUMBAR LAMINECTOMY/DECOMPRESSION MICRODISCECTOMY Left 01/18/2016   Procedure:  DECOMPRESSION L4-L5 MICRODISCECTOMY L4-L5 ON LEFT FOR SPINAL STENOSIS;  Surgeon: [name], MD;  Location: [facility];  Service: Orthopedics;  Laterality: Left;  . SPINE SURGERY    . TONSILLECTOMY    . VASECTOMY    . WRIST GANGLION EXCISION Left     SOCIAL HISTORY: Social History   Socioeconomic History  . Marital status: Married    Spouse name: Not on file  . Number of children: 2  . Years of education: Not on file  . Highest education level: Not on file  Social Needs  . Financial resource strain: Not on file  . Food insecurity - worry: Not on file  . Food insecurity - inability: Not on file  . Transportation needs - medical: Not on file  . Transportation needs - non-medical: Not on file  Occupational History  . Occupation: bus Education administrator: RETIRED  Tobacco Use  .  Smoking status: Former Smoker    Types: Cigarettes    Last attempt to quit: 12/14/1972    Years since quitting: 45.0  . Smokeless tobacco: Never Used  Substance and Sexual Activity  . Alcohol use: Yes    Comment: occasionally  . Drug use: No  . Sexual activity: Yes    Birth control/protection: None  Other Topics Concern  . Not on file  Social History Narrative  . Not on file    FAMILY HISTORY: Family History  Problem Relation Age of Onset  . Colon cancer Maternal Grandfather   . Kidney disease Brother   . Stroke Maternal Grandmother   . Diabetes Paternal Aunt        x 4 aunts    ALLERGIES:  is allergic to lisinopril and other.  MEDICATIONS:  Current Outpatient Medications  Medication Sig Dispense Refill  . acetaminophen (TYLENOL) 500 MG tablet Take 2 tablets (1,000 mg total) by mouth  every 8 (eight) hours as needed for mild pain, moderate pain or fever. Take 2 tabs every 8 hours. (Patient taking differently: Take 1,000 mg by mouth every 8 (eight) hours as needed for mild pain, moderate pain or fever. ) 90 tablet prn  . amLODipine (NORVASC) 10 MG tablet Take 10 mg by mouth daily.    [name] aspirin EC 81 MG tablet Take 81 mg by mouth daily.    . folic acid (FOLVITE) 1 MG tablet Take 1 mg by mouth daily.    [name] gabapentin (NEURONTIN) 300 MG capsule TAKE 1 CAPSULE BY MOUTH THREE TIMES DAILY (Patient taking differently: Take 300 mg by mouth twice daily) 90 capsule 0  . hydrochlorothiazide (HYDRODIURIL) 25 MG tablet TAKE 1 TABLET BY MOUTH ONCE DAILY (Patient taking differently: TAKE 25 MG BY MOUTH ONCE DAILY) 30 tablet 6  . methotrexate 250 MG/10ML injection Inject 15 mg into the skin every Thursday.     . metoprolol succinate (TOPROL-XL) 50 MG 24 hr tablet TAKE 1 TABLET BY MOUTH ONCE DAILY. TAKE WITH OR IMMEDIATELY FOLLOWING A MEAL (Patient taking differently: TAKE 50 MG BY MOUTH ONCE DAILY. TAKE WITH OR IMMEDIATELY FOLLOWING A MEAL) 90 tablet 3  . predniSONE (DELTASONE) 5 MG tablet Take 5  mg by mouth daily with breakfast.    . primidone (MYSOLINE) 50 MG tablet Take 50 mg by mouth 2 (two) times daily.     . ranitidine (ZANTAC) 150 MG tablet Take 1 tablet (150 mg total) by mouth 2 (two) times daily. (Patient taking differently: Take 150 mg by mouth daily. ) 60 tablet 0  . Tamsulosin HCl (FLOMAX) 0.4 MG CAPS Take 0.4 mg by mouth at bedtime.     . traMADol (ULTRAM) 50 MG tablet Take 1 tablet (50 mg total) every 8 (eight) hours as needed by mouth for severe pain. 30 tablet 0   No current facility-administered medications for this visit.     REVIEW OF SYSTEMS:   Constitutional: Denies fevers, chills or abnormal night sweats Eyes: Denies blurriness of vision, double vision or watery eyes Ears, nose, mouth, throat, and face: Denies mucositis or sore throat Respiratory: Denies cough, dyspnea or wheezes Cardiovascular: Denies palpitation, chest discomfort or lower extremity swelling Gastrointestinal:  Denies nausea, heartburn or change in bowel habits Skin: Denies abnormal skin rashes Lymphatics: Denies new lymphadenopathy or easy bruising Neurological:Denies numbness, tingling or new weaknesses Behavioral/Psych: Mood is stable, no new changes  All other systems were reviewed with the patient and are negative.  PHYSICAL EXAMINATION: ECOG PERFORMANCE STATUS: 1 - Symptomatic but completely ambulatory  Vitals:   12/19/17 1250  BP: (!) 148/68  Pulse: 81  Resp: 20  Temp: 98.5 F (36.9 C)  SpO2: 100%   Filed Weights   12/19/17 1250  Weight: 232 lb 14.4 oz (105.6 kg)    GENERAL:alert, no distress and comfortable SKIN: skin color, texture, turgor are normal, no rashes or significant lesions EYES: normal, conjunctiva are pink and non-injected, sclera clear OROPHARYNX:no exudate, no erythema and lips, buccal mucosa, and tongue normal  NECK: supple, thyroid normal size, non-tender, without nodularity LYMPH:  no palpable lymphadenopathy in the cervical, axillary or inguinal,  palpable fullness over the left jaw/parotid gland LUNGS: clear to auscultation and percussion with normal breathing effort HEART: regular rate & rhythm and no murmurs and no lower extremity edema ABDOMEN:abdomen soft, non-tender and normal bowel sounds Musculoskeletal:no cyanosis of digits and no clubbing  PSYCH: alert & oriented x 3 with fluent speech NEURO: no focal  motor/sensory deficits  LABORATORY DATA:  I have reviewed the data as listed Recent Results (from the past 2160 hour(s))  APTT upon arrival     Status: None   Collection Time: 12/03/17 10:51 AM  Result Value Ref Range   aPTT 25 24 - 36 seconds  CBC upon arrival     Status: Abnormal   Collection Time: 12/03/17 10:51 AM  Result Value Ref Range   WBC 4.2 4.0 - 10.5 K/uL   RBC 4.17 (L) 4.22 - 5.81 MIL/uL   Hemoglobin 14.2 13.0 - 17.0 g/dL   HCT 42.5 39.0 - 52.0 %   MCV 101.9 (H) 78.0 - 100.0 fL   MCH 34.1 (H) 26.0 - 34.0 pg   MCHC 33.4 30.0 - 36.0 g/dL   RDW 14.9 11.5 - 15.5 %   Platelets 203 150 - 400 K/uL  Protime-INR upon arrival     Status: None   Collection Time: 12/03/17 10:51 AM  Result Value Ref Range   Prothrombin Time 13.0 11.4 - 15.2 seconds   INR 0.99   Vitamin B12     Status: None   Collection Time: 12/19/17  2:16 PM  Result Value Ref Range   Vitamin B-12 264 180 - 914 pg/mL    Comment: (NOTE) This assay is not validated for testing neonatal or myeloproliferative syndrome specimens for Vitamin B12 levels. Performed at [facility], [place] [address] [place], [place] [address]   Folate RBC     Status: None   Collection Time: 12/19/17  2:16 PM  Result Value Ref Range   Folate, Hemolysate 565.3 Not Estab. ng/mL   Hematocrit 42.7 37.5 - 51.0 %   Folate, RBC 1,324 >498 ng/mL    Comment: (NOTE) Performed At: [facility] [place], [place] [id number] Rush Farmer MD [id number] Performed at [facility], [place] [address], [place], [place]  [address]   Iron and TIBC     Status: Abnormal   Collection Time: 12/19/17  2:16 PM  Result Value Ref Range   Iron 130 42 - 163 ug/dL   TIBC 331 202 - 409 ug/dL   Saturation Ratios 39 (L) 42 - 163 %   UIBC 201 ug/dL    Comment: Performed at [facility], [place] [address] [place], [place] [address]  Ferritin     Status: None   Collection Time: 12/19/17  2:16 PM  Result Value Ref Range   Ferritin 42 22 - 316 ng/mL    Comment: Performed at [facility], [place] [address], [place], [place] [address]  Hepatitis B surface antigen     Status: None   Collection Time: 12/19/17  2:17 PM  Result Value Ref Range   Hepatitis B Surface Ag Negative Negative    Comment: (NOTE) Performed At: [facility] [address] [place], [place] [id number] Rush Farmer MD [id number] Performed at [facility], [place] [address], [place], [place] [address]   Hepatitis B core antibody, total     Status: None   Collection Time: 12/19/17  2:17 PM  Result Value Ref Range   Hep B Core Total Ab Negative Negative    Comment: (NOTE) Performed At: [facility] [address] [place], [place] [id number] Rush Farmer MD [id number] Performed at [facility], [place] [address] [place], [place] [address]   Hepatitis C antibody     Status: None   Collection Time: 12/19/17  2:17 PM  Result Value Ref Range   HCV Ab <0.1 0.0 - 0.9 s/co ratio    Comment: (NOTE)                                  Negative:     < 0.8                             Indeterminate: 0.8 - 0.9                                  Positive:     > 0.9 The CDC recommends that a positive HCV antibody result be followed up with a HCV Nucleic Acid Amplification test (160109). Performed At: [facility] [place], [place] [id number] Rush Farmer MD [id number] Performed at [facility], [place] [address] [place], [place] [address]   Lactate dehydrogenase     Status: Abnormal   Collection Time: 12/19/17  2:17 PM  Result Value Ref Range   LDH 287 (H) 125 - 245 U/L    Comment: Performed at [facility], [place] [address], [place], [place] [address]  CMP ([place] only)     Status: Abnormal   Collection Time: 12/19/17  2:17 PM  Result Value Ref Range   Sodium 142 136 - 145 mmol/L   Potassium 3.7 3.5 - 5.1 mmol/L   Chloride 100 98 - 109 mmol/L   CO2 32 (H) 22 - 29 mmol/L   Glucose, Bld 93 70 - 140 mg/dL   BUN 20 7 - 26 mg/dL   Creatinine 1.39 (H) 0.70 - 1.30 mg/dL   Calcium 9.1 8.4 - 10.4 mg/dL   Total Protein 7.1 6.4 - 8.3 g/dL   Albumin 3.8 3.5 - 5.0 g/dL   AST 22 5 - 34 U/L   ALT 33 0 - 55 U/L   Alkaline Phosphatase 96 40 - 150 U/L   Total Bilirubin 0.3 0.2 - 1.2 mg/dL   GFR, Est Non Af Am 49 (L) >60 mL/min   GFR, Est AFR Am 56 (L) >60 mL/min    Comment: (NOTE) The eGFR has been calculated using the CKD EPI equation. This calculation has not been validated in all clinical situations. eGFR's persistently <60 mL/min signify possible Chronic Kidney Disease.    Anion gap 10 3 - 11    Comment: Performed at [facility], [address] [place] [address] [place], [place] [address]       RADIOGRAPHIC STUDIES: I have personally reviewed the radiological images as listed and agreed with the findings in the report. [place] Core Biopsy (salivary Gland/parotid Gland)  Result Date: 12/03/2017 INDICATION: Left parotid mass EXAM: ULTRASOUND-GUIDED CORE BIOPSY OF A LEFT PAROTID MASS MEDICATIONS: None. ANESTHESIA/SEDATION: Fentanyl 50 mcg IV; Versed 1 mg IV Moderate Sedation Time:  7 minutes The patient was continuously monitored during the procedure by the interventional radiology nurse under my direct supervision. FLUOROSCOPY TIME:  Fluoroscopy Time:  minutes  seconds ( mGy). COMPLICATIONS: None immediate. PROCEDURE: Informed written consent was  obtained from the patient after a thorough discussion of the procedural risks, benefits and alternatives. All questions were addressed. Maximal Sterile Barrier Technique was utilized including caps, mask, sterile gowns, sterile gloves, sterile drape, hand hygiene and skin antiseptic. A  timeout was performed prior to the initiation of the procedure. The left upper neck was prepped with ChloraPrep in a sterile fashion, and a sterile drape was applied covering the operative field. A sterile gown and sterile gloves were used for the procedure. Under sonographic guidance, 2 18 gauge core biopsies of the left parotid mass were obtained. Final imaging was performed. Patient tolerated the procedure well without complication. Vital sign monitoring by nursing staff during the procedure will continue as patient is in the special procedures unit for post procedure observation. FINDINGS: The images document guide needle placement within the left parotid mass. Post biopsy images demonstrate no hemorrhage. IMPRESSION: Successful ultrasound-guided core biopsy of a left parotid mass. Electronically Signed   By: Marybelle Killings M.D.   On: 12/03/2017 14:43    ASSESSMENT & PLAN:   72 y.o. is a  male with    1. 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 -Discussed pt labwork today, and the results of his most recent biopsies revealing low grade lymphoma. His Bx was taken in the context of taking prednisone. It is not definitive that he has lymphoma but it is the most likely explanation.  -Extranodal Marginal-zone lymphoma is likely, slow growing . Has regressed due to steroid use. No anemia or thrombocytopenia. . Lab Results  Component Value Date   LDH 287 (H) 12/19/2017   Hep C neg PLAN -we discussed available labs results. -Need for PET/CT scan to understand stage of the lymphoma.  -Hepatitis markers and blood tests as well. LDH  -would recommend tapering off/discontinuing prednisone  by rheumatology to also clear understanding of his diagnosis. -he will need a better biopsy to establish diagnosis. Best done when off steroids. -assuming this represeent EN MZL -discussed possible treatment trajectories I -Discussed criteria for treatment.  -Discussed tapering off prednisone over the next few weeks in conversation with his rheumatologist; we will have defer judgement to rheumatologist about how to taper./stop -Discussed POC if he were to become anemic, and because he is a Jehovah's Witness he would not want a full blood transfusion if ever necessary.   2. Rheumatoid Arthritis -on MTX . -Mx of MTX and prednisone per his rheumatologist.  3. Low B12 levels -B12 SL 1026mg SL daily  Labs today PET/CT in 1 week RTC with Dr KIrene Limboin 2 weeks  All questions were answered. The patient knows to call the clinic with any problems, questions or concerns. I spent 45 minutes counseling the patient face to face. The total time spent in the appointment was 60 minutes and more than 50% was on counseling.    This document serves as a record of services personally performed by GSullivan Lone MD. It was created on his behalf by SBaldwin Jamaica a trained medical scribe. The creation of this record is based on the scribe's personal observations and the provider's statements to them.   .I have reviewed the above documentation for accuracy and completeness, and I agree with the above. .Brunetta GeneraMD MS

## 2017-12-19 ENCOUNTER — Telehealth: Payer: Self-pay | Admitting: Hematology

## 2017-12-19 ENCOUNTER — Encounter: Payer: Self-pay | Admitting: Hematology

## 2017-12-19 ENCOUNTER — Inpatient Hospital Stay: Payer: Medicare HMO

## 2017-12-19 ENCOUNTER — Inpatient Hospital Stay: Payer: Medicare HMO | Attending: Hematology | Admitting: Hematology

## 2017-12-19 VITALS — BP 148/68 | HR 81 | Temp 98.5°F | Resp 20 | Ht 65.0 in | Wt 232.9 lb

## 2017-12-19 DIAGNOSIS — D649 Anemia, unspecified: Secondary | ICD-10-CM

## 2017-12-19 DIAGNOSIS — R52 Pain, unspecified: Secondary | ICD-10-CM | POA: Diagnosis not present

## 2017-12-19 DIAGNOSIS — E669 Obesity, unspecified: Secondary | ICD-10-CM | POA: Diagnosis not present

## 2017-12-19 DIAGNOSIS — I10 Essential (primary) hypertension: Secondary | ICD-10-CM | POA: Diagnosis not present

## 2017-12-19 DIAGNOSIS — M069 Rheumatoid arthritis, unspecified: Secondary | ICD-10-CM | POA: Diagnosis not present

## 2017-12-19 DIAGNOSIS — C884 Extranodal marginal zone B-cell lymphoma of mucosa-associated lymphoid tissue [MALT-lymphoma]: Secondary | ICD-10-CM | POA: Insufficient documentation

## 2017-12-19 DIAGNOSIS — E538 Deficiency of other specified B group vitamins: Secondary | ICD-10-CM | POA: Diagnosis not present

## 2017-12-19 DIAGNOSIS — R61 Generalized hyperhidrosis: Secondary | ICD-10-CM | POA: Diagnosis not present

## 2017-12-19 DIAGNOSIS — G250 Essential tremor: Secondary | ICD-10-CM | POA: Diagnosis not present

## 2017-12-19 DIAGNOSIS — Z79899 Other long term (current) drug therapy: Secondary | ICD-10-CM | POA: Insufficient documentation

## 2017-12-19 DIAGNOSIS — Z87891 Personal history of nicotine dependence: Secondary | ICD-10-CM | POA: Insufficient documentation

## 2017-12-19 DIAGNOSIS — F4310 Post-traumatic stress disorder, unspecified: Secondary | ICD-10-CM | POA: Diagnosis not present

## 2017-12-19 DIAGNOSIS — C8580 Other specified types of non-Hodgkin lymphoma, unspecified site: Secondary | ICD-10-CM

## 2017-12-19 DIAGNOSIS — R69 Illness, unspecified: Secondary | ICD-10-CM | POA: Diagnosis not present

## 2017-12-19 LAB — CMP (CANCER CENTER ONLY)
ALBUMIN: 3.8 g/dL (ref 3.5–5.0)
ALK PHOS: 96 U/L (ref 40–150)
ALT: 33 U/L (ref 0–55)
AST: 22 U/L (ref 5–34)
Anion gap: 10 (ref 3–11)
BUN: 20 mg/dL (ref 7–26)
CALCIUM: 9.1 mg/dL (ref 8.4–10.4)
CO2: 32 mmol/L — AB (ref 22–29)
CREATININE: 1.39 mg/dL — AB (ref 0.70–1.30)
Chloride: 100 mmol/L (ref 98–109)
GFR, Est AFR Am: 56 mL/min — ABNORMAL LOW (ref >60)
GFR, Estimated: 49 mL/min — ABNORMAL LOW (ref >60)
GLUCOSE: 93 mg/dL (ref 70–140)
Potassium: 3.7 mmol/L (ref 3.5–5.1)
SODIUM: 142 mmol/L (ref 136–145)
Total Bilirubin: 0.3 mg/dL (ref 0.2–1.2)
Total Protein: 7.1 g/dL (ref 6.4–8.3)

## 2017-12-19 LAB — IRON AND TIBC
Iron: 130 ug/dL (ref 42–163)
SATURATION RATIOS: 39 % — AB (ref 42–163)
TIBC: 331 ug/dL (ref 202–409)
UIBC: 201 ug/dL

## 2017-12-19 LAB — FERRITIN: Ferritin: 42 ng/mL (ref 22–316)

## 2017-12-19 LAB — LACTATE DEHYDROGENASE: LDH: 287 U/L — ABNORMAL HIGH (ref 125–245)

## 2017-12-19 LAB — VITAMIN B12: Vitamin B-12: 264 pg/mL (ref 180–914)

## 2017-12-19 NOTE — Patient Instructions (Signed)
Thank you for choosing [facility] to provide your oncology and hematology care.  To afford each patient quality time with our providers, please arrive 30 minutes before your scheduled appointment time.  If you arrive late for your appointment, you may be asked to reschedule.  We strive to give you quality time with our providers, and arriving late affects you and other patients whose appointments are after yours.   If you are a no show for multiple scheduled visits, you may be dismissed from the clinic at the providers discretion.    Again, thank you for choosing [facility], our hope is that these requests will decrease the amount of time that you wait before being seen by our physicians.  ______________________________________________________________________  Should you have questions after your visit to the [facility], please contact our office at [phone] between the hours of 8:30 and 4:30 p.m.    Voicemails left after 4:30p.m will not be returned until the following business day.    For prescription refill requests, please have your pharmacy contact us directly.  Please also try to allow 48 hours for prescription requests.    Please contact the scheduling department for questions regarding scheduling.  For scheduling of procedures such as PET scans, CT scans, MRI, Ultrasound, etc please contact central scheduling at [phone].    Resources For Cancer Patients and Caregivers:   Oncolink.org:  A wonderful resource for patients and healthcare providers for information regarding your disease, ways to tract your treatment, what to expect, etc.     American Cancer Society:  800-227-2345  Can help patients locate various types of support and financial assistance  Cancer Care: 1-800-813-HOPE (4673) Provides financial assistance, online support groups, medication/co-pay assistance.    [place] DSS:  336-641-3447 Where to apply for food  stamps, Medicaid, and utility assistance  Medicare Rights Center: 800-333-4114 Helps people with Medicare understand their rights and benefits, navigate the Medicare system, and secure the quality healthcare they deserve  SCAT: 336-333-6589 [employer]'s shared-ride transportation service for eligible riders who have a disability that prevents them from riding the fixed route bus.    For additional information on assistance programs please contact our social worker:   Grier Hock/Abigail Elmore:  [phone]            

## 2017-12-19 NOTE — Telephone Encounter (Signed)
Appointments scheduled and patient was given copies of AVS/Calendar per 2/7 los

## 2017-12-20 ENCOUNTER — Telehealth: Payer: Self-pay | Admitting: *Deleted

## 2017-12-20 LAB — FOLATE RBC
Folate, Hemolysate: 565.3 ng/mL
Folate, RBC: 1324 ng/mL (ref >498)
HEMATOCRIT: 42.7 % (ref 37.5–51.0)

## 2017-12-20 LAB — HEPATITIS C ANTIBODY: HCV Ab: <0.1 s/co ratio (ref 0.0–0.9)

## 2017-12-20 LAB — HEPATITIS B CORE ANTIBODY, TOTAL: HEP B C TOTAL AB: NEGATIVE

## 2017-12-20 LAB — HEPATITIS B SURFACE ANTIGEN: HEP B S AG: NEGATIVE

## 2017-12-20 NOTE — Telephone Encounter (Signed)
Pt called stating rheumatologist asked him to inquire if he should stop prednisone completely or taper drug.  Per Dr. Irene Limbo, ideally he would stop prednisone completely unless deemed unsafe by the rheumatologist.  Advised pt to check with rheumatologist prior to just stopping dose.  Pt verbalized understanding.

## 2017-12-25 ENCOUNTER — Telehealth: Payer: Self-pay | Admitting: *Deleted

## 2017-12-25 NOTE — Telephone Encounter (Signed)
As noted below by Dr. Irene Limbo, I informed patient that his vitamin B 12 level is low. Dr. Irene Limbo recommends B 12 1000 mcg sublingual daily. This medication is over the counter. He verbalized understanding.

## 2017-12-25 NOTE — Telephone Encounter (Signed)
-----   Message from Johann Capers, RN sent at 12/25/2017  2:19 PM EST -----   ----- Message ----- From: [name], MD Sent: 12/23/2017   6:23 PM To: Johann Capers, RN  Aldona Bar, Plz let patient know his B12 levels are low. Would recommend -B12 SL 1033mcg SL daily OTC. thx GK

## 2017-12-26 ENCOUNTER — Telehealth: Payer: Self-pay | Admitting: Hematology

## 2017-12-26 NOTE — Telephone Encounter (Signed)
FAXED RECORDS TO MEDICATION MANAGEMENT [phone]

## 2017-12-30 ENCOUNTER — Ambulatory Visit (HOSPITAL_COMMUNITY): Payer: Medicare HMO

## 2018-01-01 ENCOUNTER — Encounter (HOSPITAL_COMMUNITY)
Admission: RE | Admit: 2018-01-01 | Discharge: 2018-01-01 | Disposition: A | Discharge status: Home / Self Care | Payer: Medicare HMO | Source: Ambulatory Visit | Attending: Hematology | Admitting: Hematology

## 2018-01-01 DIAGNOSIS — C8580 Other specified types of non-Hodgkin lymphoma, unspecified site: Secondary | ICD-10-CM | POA: Diagnosis not present

## 2018-01-01 DIAGNOSIS — M0579 Rheumatoid arthritis with rheumatoid factor of multiple sites without organ or systems involvement: Secondary | ICD-10-CM | POA: Diagnosis not present

## 2018-01-01 DIAGNOSIS — E669 Obesity, unspecified: Secondary | ICD-10-CM | POA: Diagnosis not present

## 2018-01-01 DIAGNOSIS — I7300 Raynaud's syndrome without gangrene: Secondary | ICD-10-CM | POA: Diagnosis not present

## 2018-01-01 DIAGNOSIS — C884 Extranodal marginal zone B-cell lymphoma of mucosa-associated lymphoid tissue [MALT-lymphoma]: Secondary | ICD-10-CM | POA: Diagnosis not present

## 2018-01-01 DIAGNOSIS — Z6839 Body mass index (BMI) 39.0-39.9, adult: Secondary | ICD-10-CM | POA: Diagnosis not present

## 2018-01-01 DIAGNOSIS — R591 Generalized enlarged lymph nodes: Secondary | ICD-10-CM | POA: Diagnosis not present

## 2018-01-01 DIAGNOSIS — M2550 Pain in unspecified joint: Secondary | ICD-10-CM | POA: Diagnosis not present

## 2018-01-01 LAB — GLUCOSE, CAPILLARY: Glucose-Capillary: 101 mg/dL — ABNORMAL HIGH (ref 65–99)

## 2018-01-01 MED ORDER — FLUDEOXYGLUCOSE F - 18 (FDG) INJECTION
10.9000 | Freq: Once | INTRAVENOUS | Status: AC | PRN
  Administered 2018-01-01: 10.9 via INTRAVENOUS

## 2018-01-03 ENCOUNTER — Telehealth: Payer: Self-pay

## 2018-01-03 ENCOUNTER — Other Ambulatory Visit: Payer: Self-pay

## 2018-01-03 ENCOUNTER — Inpatient Hospital Stay: Payer: Medicare HMO

## 2018-01-03 ENCOUNTER — Inpatient Hospital Stay (HOSPITAL_BASED_OUTPATIENT_CLINIC_OR_DEPARTMENT_OTHER): Payer: Medicare HMO | Admitting: Hematology

## 2018-01-03 ENCOUNTER — Encounter: Payer: Self-pay | Admitting: Hematology

## 2018-01-03 VITALS — BP 147/80 | HR 69 | Temp 98.6°F | Resp 18 | Ht 65.0 in | Wt 233.5 lb

## 2018-01-03 DIAGNOSIS — C884 Extranodal marginal zone B-cell lymphoma of mucosa-associated lymphoid tissue [MALT-lymphoma]: Secondary | ICD-10-CM | POA: Diagnosis not present

## 2018-01-03 DIAGNOSIS — E538 Deficiency of other specified B group vitamins: Secondary | ICD-10-CM | POA: Diagnosis not present

## 2018-01-03 DIAGNOSIS — C8580 Other specified types of non-Hodgkin lymphoma, unspecified site: Secondary | ICD-10-CM

## 2018-01-03 DIAGNOSIS — D649 Anemia, unspecified: Secondary | ICD-10-CM | POA: Diagnosis not present

## 2018-01-03 DIAGNOSIS — I10 Essential (primary) hypertension: Secondary | ICD-10-CM | POA: Diagnosis not present

## 2018-01-03 DIAGNOSIS — K119 Disease of salivary gland, unspecified: Secondary | ICD-10-CM | POA: Diagnosis not present

## 2018-01-03 DIAGNOSIS — Z87891 Personal history of nicotine dependence: Secondary | ICD-10-CM | POA: Diagnosis not present

## 2018-01-03 DIAGNOSIS — R590 Localized enlarged lymph nodes: Secondary | ICD-10-CM

## 2018-01-03 DIAGNOSIS — G250 Essential tremor: Secondary | ICD-10-CM | POA: Diagnosis not present

## 2018-01-03 DIAGNOSIS — K118 Other diseases of salivary glands: Secondary | ICD-10-CM

## 2018-01-03 DIAGNOSIS — M069 Rheumatoid arthritis, unspecified: Secondary | ICD-10-CM

## 2018-01-03 DIAGNOSIS — R61 Generalized hyperhidrosis: Secondary | ICD-10-CM | POA: Diagnosis not present

## 2018-01-03 DIAGNOSIS — R52 Pain, unspecified: Secondary | ICD-10-CM | POA: Diagnosis not present

## 2018-01-03 DIAGNOSIS — Z79899 Other long term (current) drug therapy: Secondary | ICD-10-CM | POA: Diagnosis not present

## 2018-01-03 DIAGNOSIS — R69 Illness, unspecified: Secondary | ICD-10-CM | POA: Diagnosis not present

## 2018-01-03 LAB — CMP (CANCER CENTER ONLY)
ALT: 28 U/L (ref 0–55)
AST: 26 U/L (ref 5–34)
Albumin: 3.6 g/dL (ref 3.5–5.0)
Alkaline Phosphatase: 92 U/L (ref 40–150)
Anion gap: 8 (ref 3–11)
BILIRUBIN TOTAL: 0.4 mg/dL (ref 0.2–1.2)
BUN: 16 mg/dL (ref 7–26)
CO2: 31 mmol/L — ABNORMAL HIGH (ref 22–29)
CREATININE: 1.24 mg/dL (ref 0.70–1.30)
Calcium: 9.2 mg/dL (ref 8.4–10.4)
Chloride: 101 mmol/L (ref 98–109)
GFR, Est AFR Am: >60 mL/min (ref >60)
GFR, Estimated: 56 mL/min — ABNORMAL LOW (ref >60)
Glucose, Bld: 93 mg/dL (ref 70–140)
Potassium: 3.9 mmol/L (ref 3.5–5.1)
Sodium: 140 mmol/L (ref 136–145)
TOTAL PROTEIN: 6.6 g/dL (ref 6.4–8.3)

## 2018-01-03 LAB — LACTATE DEHYDROGENASE: LDH: 266 U/L — AB (ref 125–245)

## 2018-01-03 LAB — CBC WITH DIFFERENTIAL (CANCER CENTER ONLY)
BASOS ABS: 0 10*3/uL (ref 0.0–0.1)
Basophils Relative: 1 %
Eosinophils Absolute: 0.1 10*3/uL (ref 0.0–0.5)
Eosinophils Relative: 2 %
HEMATOCRIT: 44.7 % (ref 38.4–49.9)
Hemoglobin: 14.6 g/dL (ref 13.0–17.1)
LYMPHS PCT: 13 %
Lymphs Abs: 0.6 10*3/uL — ABNORMAL LOW (ref 0.9–3.3)
MCH: 33.3 pg (ref 27.2–33.4)
MCHC: 32.6 g/dL (ref 32.0–36.0)
MCV: 102.2 fL — ABNORMAL HIGH (ref 79.3–98.0)
MONO ABS: 0.3 10*3/uL (ref 0.1–0.9)
Monocytes Relative: 5 %
NEUTROS ABS: 3.9 10*3/uL (ref 1.5–6.5)
Neutrophils Relative %: 79 %
Platelet Count: 153 10*3/uL (ref 140–400)
RBC: 4.37 MIL/uL (ref 4.20–5.82)
RDW: 15 % — ABNORMAL HIGH (ref 11.0–14.6)
WBC: 4.9 10*3/uL (ref 4.0–10.3)

## 2018-01-03 NOTE — Telephone Encounter (Signed)
Printed avs and calender of upcoming appointment also called and faxed ref. To [facility]. Per 2/22 los

## 2018-01-03 NOTE — Telephone Encounter (Signed)
Spoke with pt to ask if he would be okay to see Dr. Irene Limbo after his lab work this morning. Pt said that he thought his lab appt was at 1300. Explained that the appt time showed 0945. Pt said that he would start getting ready and be on his way, but that it would take about 45 minutes to get here. Edited appt note to add that pt will be late, but it's okay to register and check-in.

## 2018-01-03 NOTE — Progress Notes (Signed)
HEMATOLOGY ONCOLOGY CLINIC NOTE  Patient Care Team: Shawnee Knapp, MD as PCP - General (Family Medicine) [name] as Physician Assistant (Emergency Medicine) [name], MD as Consulting Physician (Orthopedic Surgery)  CHIEF COMPLAINTS F/u Concern for MALT lymphoma, review of PET/CT and further evaluation and management.  HISTORY OF PRESENTING ILLNESS:   Bryan [name] 72 y.o. male is here because of a referral from ENT [name] regarding a concern of MALT lymphoma.   He is accompanied today by 4 members of his family. The pt reports that he is doing well overall. The pt's PCP is [name]. The pt sees [name] for his Rheumatology. The pt went off of humira a month before his mass appeared two months. The pt takes Methotrexate 15 mg once each week for his rheumatoid arthritis. He notes that he took humira for 8-10 months, and hasn't taken it in about 3 months. He began taking prednisone 69m q day, 2 months ago.   He reports having high blood pressure and takes amlodipine, metoprolol for it. He reports having had back and neck surgery for discs.  He also notes having essential tremors in his hands for which he takes primidone.  He reports having run out of flomax. He has had some PTSD from [place]  He notes he has had drenching night sweats for the last 15 years that are of intermittent frequency. He denies any other medical issues.    He first noticed swelling of his left cheek about two months ago. He notes that it was "hard like a rock." He was placed on prednisone for about a month after going to the doctor, with some relief. He then had his needle Bx recorded below.   He reports gum pain that began this morning, and also reports that his tongue feels as if it has bumps on it. He reports taking folic acid 11meach day.    On 10/29/17 the pt had a neck CT revealing Multifocal left parotid lesion. Multiple ill-defined  enhancing nodules in the left parotid gland. Asymmetric enhancing lymph nodes in the left neck are not pathologically enlarged however given the asymmetry and the left parotid lesion, they could represent neoplastic spread of parotid malignancy or lymphoma. Tissue sampling recommended.  Of note prior to the patient's visit, pt has had a biopsy of his left parotid gland completed on 12/03/17 with results revealing Atypical lymphoid proliferation. 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 -   B cell clonality study was positive for clonality.   On review of systems, pt reports gum pain, fatigue (not recent), occasional night sweats, ankle pain, and denies abdominal pains, back pain, flank pain, leg swelling, swollen or painful joints beside ankles.   On PMHx the pt has had rheumatoid arthritis for 6 years and HTN. He notes that he has anemia and is a JeAcupuncturistitness I- which he notes means he would not want to consider blood products  On Surgical Hx the pt had back surgery. On Social Hx the pt quit smoking in 1976 after smoking about 8 cigarettes each day for 15 years. He notes drinking ETOH about once a week. He denies chemical or radiation exposure, except for agent orange.  On Family Hx the pt notes high blood pressure, DM, but denies autoimmune conditions, cancers, or blood disorders.  INTERVAL HISTORY:  [name] is here for a scheduled follow-up of his suspected MALT lymphoma. He is accompanied by his 3 family members. Since his last visit he feels a mild change to his left mouth, cheek and neck with increased swelling. The patient adds he is noticing a more "peppery" taste in his mouth and at times will notice a "peppery" scent when no one else does. He notes that he has been off the prednisone and MTX for his RA. PET/CT results were reviewed in details.  On  review of systems, pt denies fever, chills, weight loss, decreased appetite, decreased energy levels, mouth sores and bilateral lower extremity. Denies pain. Pt denies abdominal pain, nausea, vomiting and bladder and bowel changes.    REVIEW OF SYSTEMS:   .10 Point review of Systems was done is negative except as noted above.   MEDICAL HISTORY:  Past Medical History:  Diagnosis Date  . Anemia   . Arthritis    hands, knees, cervical area. Back pain. Rheumatoid arthritis- weekly injections.  [name] BPH (benign prostatic hypertrophy)   . Essential tremor   . GERD (gastroesophageal reflux disease)    reports for indigestion he uses mustard   . Hearing deficit    wears hearing aids bilateral  . HTN (hypertension)   . Obesity   . Refusal of blood transfusions as patient is Jehovah's Witness   . Right bundle branch block    history of  . Seizures (HCC)    AS A CHILD. only esential tremors now.  . Sleep apnea    cpap - settings at 9 per patient   . Ulcer   JEHOVA's WITNESS  SURGICAL HISTORY: Past Surgical History:  Procedure Laterality Date  . ANTERIOR CERVICAL DECOMP/DISCECTOMY FUSION N/A 03/30/2013   Procedure: ANTERIOR CERVICAL DECOMPRESSION/DISCECTOMY FUSION 2 LEVELS;  Surgeon: Otilio Connors, MD;  Location: [facility];  Service: Neurosurgery;  Laterality: N/A;  C4-5 C5-6 Anterior cervical decompression/diskectomy/fusion/Allograft/Plate  . CHOLECYSTECTOMY N/A 12/07/2013   Procedure: LAPAROSCOPIC CHOLECYSTECTOMY WITH INTRAOPERATIVE CHOLANGIOGRAM;  Surgeon: Adin Hector, MD;  Location: [facility];  Service: General;  Laterality: N/A;  . COLONOSCOPY    . DECOMPRESSIVE LUMBAR LAMINECTOMY LEVEL 2 N/A 02/15/2015   Procedure: COMPLETE DECOMPRESSIVE LUMBAR LAMINECTOMY L4-L5/ FORAMINOTOMY TO L4 NERVE ROOT AND L5 NERVE ROOT BILATERALLY;  Surgeon: [name], MD;  Location: [facility];  Service: Orthopedics;  Laterality: N/A;  . EYE SURGERY     right, growth excision  . LUMBAR  LAMINECTOMY/DECOMPRESSION MICRODISCECTOMY Left 01/18/2016   Procedure:  DECOMPRESSION L4-L5 MICRODISCECTOMY L4-L5 ON LEFT FOR SPINAL STENOSIS;  Surgeon: [name], MD;  Location: [facility];  Service: Orthopedics;  Laterality: Left;  . SPINE SURGERY    . TONSILLECTOMY    . VASECTOMY    . WRIST GANGLION EXCISION Left     SOCIAL HISTORY: Social History   Socioeconomic History  . Marital status: Married    Spouse name: Not on file  . Number of children: 2  . Years of education: Not on file  . Highest education level: Not on file  Social Needs  . Financial resource strain: Not on file  . Food insecurity - worry: Not on file  . Food insecurity - inability: Not on file  . Transportation needs - medical: Not on file  . Transportation needs - non-medical: Not on file  Occupational History  . Occupation: bus Education administrator: RETIRED  Tobacco Use  . Smoking status: Former Smoker    Types: Cigarettes  Last attempt to quit: 12/14/1972    Years since quitting: 45.0  . Smokeless tobacco: Never Used  Substance and Sexual Activity  . Alcohol use: Yes    Comment: occasionally  . Drug use: No  . Sexual activity: Yes    Birth control/protection: None  Other Topics Concern  . Not on file  Social History Narrative  . Not on file    FAMILY HISTORY: Family History  Problem Relation Age of Onset  . Colon cancer Maternal Grandfather   . Kidney disease Brother   . Stroke Maternal Grandmother   . Diabetes Paternal Aunt        x 4 aunts    ALLERGIES:  is allergic to lisinopril and other.  MEDICATIONS:  Current Outpatient Medications  Medication Sig Dispense Refill  . amLODipine (NORVASC) 10 MG tablet Take 10 mg by mouth daily.    [name] aspirin EC 81 MG tablet Take 81 mg by mouth daily.    . folic acid (FOLVITE) 1 MG tablet Take 1 mg by mouth daily.    [name] gabapentin (NEURONTIN) 300 MG capsule TAKE 1 CAPSULE BY MOUTH THREE TIMES DAILY (Patient taking differently: Take 300 mg by mouth  twice daily) 90 capsule 0  . hydrochlorothiazide (HYDRODIURIL) 25 MG tablet TAKE 1 TABLET BY MOUTH ONCE DAILY (Patient taking differently: TAKE 25 MG BY MOUTH ONCE DAILY) 30 tablet 6  . metoprolol succinate (TOPROL-XL) 50 MG 24 hr tablet TAKE 1 TABLET BY MOUTH ONCE DAILY. TAKE WITH OR IMMEDIATELY FOLLOWING A MEAL (Patient taking differently: TAKE 50 MG BY MOUTH ONCE DAILY. TAKE WITH OR IMMEDIATELY FOLLOWING A MEAL) 90 tablet 3  . primidone (MYSOLINE) 50 MG tablet Take 50 mg by mouth 2 (two) times daily.     . ranitidine (ZANTAC) 150 MG tablet Take 1 tablet (150 mg total) by mouth 2 (two) times daily. (Patient taking differently: Take 150 mg by mouth daily. ) 60 tablet 0  . Tamsulosin HCl (FLOMAX) 0.4 MG CAPS Take 0.4 mg by mouth at bedtime.     . traMADol (ULTRAM) 50 MG tablet Take 1 tablet (50 mg total) every 8 (eight) hours as needed by mouth for severe pain. 30 tablet 0   No current facility-administered medications for this visit.     PHYSICAL EXAMINATION: ECOG PERFORMANCE STATUS: 1 - Symptomatic but completely ambulatory  Vitals:   01/03/18 1110  BP: (!) 147/80  Pulse: 69  Resp: 18  Temp: 98.6 F (37 C)  SpO2: 98%   Filed Weights   01/03/18 1110  Weight: 233 lb 8 oz (105.9 kg)    . GENERAL:alert, in no acute distress and comfortable SKIN: no acute rashes, no significant lesions EYES: conjunctiva are pink and non-injected, sclera anicteric OROPHARYNX: MMM, no exudates, no oropharyngeal erythema or ulceration NECK: supple, no JVD LYMPH:  Cervical lymphadenopathy on the left, palpable fullness over the left jaw/parotid gland increasing in size. No palpable axillary or inguinal LNadenopathy. LUNGS: clear to auscultation b/l with normal respiratory effort HEART: regular rate & rhythm ABDOMEN:  normoactive bowel sounds , non tender, not distended. Extremity: no pedal edema PSYCH: alert & oriented x 3 with fluent speech NEURO: no focal motor/sensory deficits    LABORATORY  DATA:  I have reviewed the data as listed Recent Results (from the past 2160 hour(s))  APTT upon arrival     Status: None   Collection Time: 12/03/17 10:51 AM  Result Value Ref Range   aPTT 25 24 - 36 seconds  CBC  upon arrival     Status: Abnormal   Collection Time: 12/03/17 10:51 AM  Result Value Ref Range   WBC 4.2 4.0 - 10.5 K/uL   RBC 4.17 (L) 4.22 - 5.81 MIL/uL   Hemoglobin 14.2 13.0 - 17.0 g/dL   HCT 42.5 39.0 - 52.0 %   MCV 101.9 (H) 78.0 - 100.0 fL   MCH 34.1 (H) 26.0 - 34.0 pg   MCHC 33.4 30.0 - 36.0 g/dL   RDW 14.9 11.5 - 15.5 %   Platelets 203 150 - 400 K/uL  Protime-INR upon arrival     Status: None   Collection Time: 12/03/17 10:51 AM  Result Value Ref Range   Prothrombin Time 13.0 11.4 - 15.2 seconds   INR 0.99   Vitamin B12     Status: None   Collection Time: 12/19/17  2:16 PM  Result Value Ref Range   Vitamin B-12 264 180 - 914 pg/mL    Comment: (NOTE) This assay is not validated for testing neonatal or myeloproliferative syndrome specimens for Vitamin B12 levels. Performed at [facility], [place] [address] [place], [place] [address]   Folate RBC     Status: None   Collection Time: 12/19/17  2:16 PM  Result Value Ref Range   Folate, Hemolysate 565.3 Not Estab. ng/mL   Hematocrit 42.7 37.5 - 51.0 %   Folate, RBC 1,324 >498 ng/mL    Comment: (NOTE) Performed At: [facility] [place], [place] [id number] Rush Farmer MD [id number] Performed at [facility], [place] [address] [place], [place] [address]   Iron and TIBC     Status: Abnormal   Collection Time: 12/19/17  2:16 PM  Result Value Ref Range   Iron 130 42 - 163 ug/dL   TIBC 331 202 - 409 ug/dL   Saturation Ratios 39 (L) 42 - 163 %   UIBC 201 ug/dL    Comment: Performed at [facility], [place] [address] [place], [place] [address]  Ferritin     Status: None   Collection Time: 12/19/17  2:16 PM  Result Value Ref  Range   Ferritin 42 22 - 316 ng/mL    Comment: Performed at [facility], [place] [address], [place], [place] [address]  Hepatitis B surface antigen     Status: None   Collection Time: 12/19/17  2:17 PM  Result Value Ref Range   Hepatitis B Surface Ag Negative Negative    Comment: (NOTE) Performed At: [facility] [address] [place], [place] [id number] Rush Farmer MD [id number] Performed at [facility], [place] [address], [place], [place] [address]   Hepatitis B core antibody, total     Status: None   Collection Time: 12/19/17  2:17 PM  Result Value Ref Range   Hep B Core Total Ab Negative Negative    Comment: (NOTE) Performed At: [facility] [address] [place], [place] [id number] Rush Farmer MD [id number] Performed at [facility], [place] [address], [place], [place] [address]   Hepatitis C antibody     Status: None   Collection Time: 12/19/17  2:17 PM  Result Value Ref Range   HCV Ab <0.1 0.0 - 0.9 s/co ratio    Comment: (NOTE)  Negative:     < 0.8                             Indeterminate: 0.8 - 0.9                                  Positive:     > 0.9 The CDC recommends that a positive HCV antibody result be followed up with a HCV Nucleic Acid Amplification test ([id number]). Performed At: [facility] [place], [place] [id number] Rush Farmer MD [id number] Performed at [facility], [place] [address], [place], [place] [address]   Lactate dehydrogenase     Status: Abnormal   Collection Time: 12/19/17  2:17 PM  Result Value Ref Range   LDH 287 (H) 125 - 245 U/L    Comment: Performed at [facility], [place] [address] [place], [place] [address]  CMP ([place] only)     Status: Abnormal   Collection Time: 12/19/17  2:17 PM  Result Value Ref Range   Sodium 142  136 - 145 mmol/L   Potassium 3.7 3.5 - 5.1 mmol/L   Chloride 100 98 - 109 mmol/L   CO2 32 (H) 22 - 29 mmol/L   Glucose, Bld 93 70 - 140 mg/dL   BUN 20 7 - 26 mg/dL   Creatinine 1.39 (H) 0.70 - 1.30 mg/dL   Calcium 9.1 8.4 - 10.4 mg/dL   Total Protein 7.1 6.4 - 8.3 g/dL   Albumin 3.8 3.5 - 5.0 g/dL   AST 22 5 - 34 U/L   ALT 33 0 - 55 U/L   Alkaline Phosphatase 96 40 - 150 U/L   Total Bilirubin 0.3 0.2 - 1.2 mg/dL   GFR, Est Non Af Am 49 (L) >60 mL/min   GFR, Est AFR Am 56 (L) >60 mL/min    Comment: (NOTE) The eGFR has been calculated using the CKD EPI equation. This calculation has not been validated in all clinical situations. eGFR's persistently <60 mL/min signify possible Chronic Kidney Disease.    Anion gap 10 3 - 11    Comment: Performed at [facility], [address], [place], [place] [address]  Glucose, capillary     Status: Abnormal   Collection Time: 01/01/18  8:52 AM  Result Value Ref Range   Glucose-Capillary 101 (H) 65 - 99 mg/dL  CBC with Differential ([facility] Only)     Status: Abnormal   Collection Time: 01/03/18  9:55 AM  Result Value Ref Range   WBC Count 4.9 4.0 - 10.3 K/uL   RBC 4.37 4.20 - 5.82 MIL/uL   Hemoglobin 14.6 13.0 - 17.1 g/dL   HCT 44.7 38.4 - 49.9 %   MCV 102.2 (H) 79.3 - 98.0 fL   MCH 33.3 27.2 - 33.4 pg   MCHC 32.6 32.0 - 36.0 g/dL   RDW 15.0 (H) 11.0 - 14.6 %   Platelet Count 153 140 - 400 K/uL   Neutrophils Relative % 79 %   Neutro Abs 3.9 1.5 - 6.5 K/uL   Lymphocytes Relative 13 %   Lymphs Abs 0.6 (L) 0.9 - 3.3 K/uL   Monocytes Relative 5 %   Monocytes Absolute 0.3 0.1 - 0.9 K/uL   Eosinophils Relative 2 %   Eosinophils Absolute 0.1 0.0 - 0.5 K/uL  Basophils Relative 1 %   Basophils Absolute 0.0 0.0 - 0.1 K/uL    Comment: Performed at [facility], [place] [address], [place], [place] [address]  CMP ([place] only)     Status: Abnormal   Collection Time: 01/03/18  9:55 AM   Result Value Ref Range   Sodium 140 136 - 145 mmol/L   Potassium 3.9 3.5 - 5.1 mmol/L   Chloride 101 98 - 109 mmol/L   CO2 31 (H) 22 - 29 mmol/L   Glucose, Bld 93 70 - 140 mg/dL   BUN 16 7 - 26 mg/dL   Creatinine 1.24 0.70 - 1.30 mg/dL   Calcium 9.2 8.4 - 10.4 mg/dL   Total Protein 6.6 6.4 - 8.3 g/dL   Albumin 3.6 3.5 - 5.0 g/dL   AST 26 5 - 34 U/L   ALT 28 0 - 55 U/L   Alkaline Phosphatase 92 40 - 150 U/L   Total Bilirubin 0.4 0.2 - 1.2 mg/dL   GFR, Est Non Af Am 56 (L) >60 mL/min   GFR, Est AFR Am >60 >60 mL/min    Comment: (NOTE) The eGFR has been calculated using the CKD EPI equation. This calculation has not been validated in all clinical situations. eGFR's persistently <60 mL/min signify possible Chronic Kidney Disease.    Anion gap 8 3 - 11    Comment: Performed at [facility], [address], [place], [place] [address]  Lactate dehydrogenase (LDH)     Status: Abnormal   Collection Time: 01/03/18  9:57 AM  Result Value Ref Range   LDH 266 (H) 125 - 245 U/L    Comment: Performed at [facility], [place] [address] [place], [place] [address]       RADIOGRAPHIC STUDIES: I have personally reviewed the radiological images as listed and agreed with the findings in the report. Nm Pet Image Initial (pi) Skull Base To Thigh  Result Date: 01/01/2018 CLINICAL DATA:  Initial treatment strategy for marginal zone B-cell lymphoma in the left parotid gland with cervical lymphadenopathy. EXAM: NUCLEAR MEDICINE PET SKULL BASE TO THIGH TECHNIQUE: 10.9 mCi F-18 FDG was injected intravenously. Full-ring PET imaging was performed from the skull base to thigh after the radiotracer. CT data was obtained and used for attenuation correction and anatomic localization. FASTING BLOOD GLUCOSE:  Value: 101 mg/dl COMPARISON:  CT neck 11/08/2017. FINDINGS: NECK There are multiple hypermetabolic and mildly enlarged left cervical lymph nodes. These include  intraparotid lesions measuring up to 12 mm on image 13 (SUV max 14.3), a 12 mm level III node on image 36 (SUV max 8.6) and several level V nodes (SUV max 10.0). No hypermetabolic or enlarged right-sided lymph nodes.There are no lesions of the pharyngeal mucosal space. CHEST There are several hypermetabolic lymph nodes within the superior mediastinum and subcarinal regions bilaterally. 13 mm right infrahilar node on image 73 has an SUV max of 9.1. Left infrahilar node measuring 11 mm on image 71 has an SUV max 8.2. No axillary or hilar adenopathy. There is no suspicious pulmonary activity. Mild emphysematous changes are present in the lungs with perihilar atelectasis or scarring bilaterally. ABDOMEN/PELVIS There is no hypermetabolic activity within the liver, adrenal glands, spleen or pancreas. There is no hypermetabolic nodal activity within the abdomen or pelvis. Small left inguinal lymph nodes are not pathologically enlarged but demonstrate mildly prominent metabolic activity (SUV max 3.2). Similar to blood pool activity (SUV 3.4). Sigmoid diverticulosis and  mild aortic and branch vessel atherosclerosis noted. SKELETON There is no hypermetabolic activity to suggest osseous metastatic disease. Postsurgical changes in the lumbar spine. Activity in the right antecubital fossa attributed to the injection. IMPRESSION: 1. Hypermetabolic lymphoma appears confined to left cervical and mediastinal lymph nodes. 2. Small left inguinal lymph nodes demonstrate nonspecific low-level activity, likely reactive. Splenic size and metabolic activity are normal. 3. Incidental findings including colonic diverticulosis and mild Aortic Atherosclerosis (ICD10-I70.0). Electronically Signed   By: Richardean Sale M.D.   On: 01/01/2018 11:59    ASSESSMENT & PLAN:   72 y.o. is a  male with    1. 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 . Lab Results  Component  Value Date   LDH 266 (H) 01/03/2018    Hep C neg PLAN -we discussed available labs results. -PET/CT done since last visit - discussed results in details and images reviewed. -lab results discussed. -given indeterminate needle biopsy and signifcantly FDG avid Lnadenopathy - recommended ENT referral for excision LN biopsy/incisonal parotid tumor biopsy. -patient is now off Prednisone and MTX and the bx results are likely to be more representative -Refer to ENT [facility]) for biopsy  2. Rheumatoid Arthritis -on off  MTX and prednisone per his rheumatologist.  3. Low B12 levels -B12 SL 107mg SL daily  [facility] and left cervical LN for LN/parotid mass biopsy- concern for lymphoma -RTC with Dr KIrene Limboin 2 weeks (3-4 days after Bx)  All questions were answered.  . The total time spent in the appointment was 25 minutes and more than 50% was on counseling and direct patient cares.     This document serves as a record of services personally performed by GSullivan Lone MD. It was created on his behalf by JMargit Banda a trained medical scribe. The creation of this record is based on the scribe's personal observations and the provider's statements to them.   .I have reviewed the above documentation for accuracy and completeness, and I agree with the above. .Brunetta GeneraMD MS

## 2018-01-06 ENCOUNTER — Other Ambulatory Visit: Payer: Self-pay | Admitting: Family Medicine

## 2018-01-08 ENCOUNTER — Other Ambulatory Visit: Payer: Self-pay

## 2018-01-08 ENCOUNTER — Encounter: Payer: Self-pay | Admitting: Physician Assistant

## 2018-01-08 ENCOUNTER — Ambulatory Visit (INDEPENDENT_AMBULATORY_CARE_PROVIDER_SITE_OTHER): Payer: Medicare HMO | Admitting: Physician Assistant

## 2018-01-08 ENCOUNTER — Ambulatory Visit (INDEPENDENT_AMBULATORY_CARE_PROVIDER_SITE_OTHER): Payer: Medicare HMO

## 2018-01-08 ENCOUNTER — Telehealth: Payer: Self-pay

## 2018-01-08 VITALS — BP 149/78 | HR 96 | Temp 98.9°F | Resp 18 | Ht 65.0 in | Wt 236.0 lb

## 2018-01-08 DIAGNOSIS — R042 Hemoptysis: Secondary | ICD-10-CM

## 2018-01-08 LAB — POCT RAPID STREP A (OFFICE): RAPID STREP A SCREEN: NEGATIVE

## 2018-01-08 MED ORDER — AZITHROMYCIN 250 MG PO TABS: ORAL_TABLET | ORAL | 0 refills | Status: DC

## 2018-01-08 NOTE — Progress Notes (Signed)
01/08/2018 11:20 AM   DOB: 1946-04-22 / MRN: [id number]  SUBJECTIVE:  Bryan [name] is a 72 y.o. male presenting for cough.  Has a sore throat.  He is coughing up some blood and is very concerned. This started about 4 days ago.  He feels he is getting worse. He has had the annual flu shot.  He associates sore throat and nasal congestion.   Immunization History  Administered Date(s) Administered  . Influenza,inj,Quad PF,6+ Mos 07/20/2015, 08/12/2017  . Influenza-Unspecified 08/12/2014, 09/18/2016  . Pneumococcal Polysaccharide-23 02/12/2013  . Tdap 12/18/2015     He is allergic to lisinopril and other.   He  has a past medical history of Anemia, Arthritis, BPH (benign prostatic hypertrophy), Essential tremor, GERD (gastroesophageal reflux disease), Hearing deficit, HTN (hypertension), Obesity, Refusal of blood transfusions as patient is Jehovah's Witness, Right bundle branch block, Seizures ([place]), Sleep apnea, and Ulcer.    He  reports that he quit smoking about 45 years ago. His smoking use included cigarettes. he has never used smokeless tobacco. He reports that he drinks alcohol. He reports that he does not use drugs. He  reports that he currently engages in sexual activity. He reports using the following method of birth control/protection: None. The patient  has a past surgical history that includes Tonsillectomy; Eye surgery; Colonoscopy; Vasectomy; Anterior cervical decomp/discectomy fusion (N/A, 03/30/2013); Spine surgery; Wrist ganglion excision (Left); Cholecystectomy (N/A, 12/07/2013); Decompressive lumbar laminectomy level 2 (N/A, 02/15/2015); and Lumbar laminectomy/decompression microdiscectomy (Left, 01/18/2016).  His family history includes Colon cancer in his maternal grandfather; Diabetes in his paternal aunt; Kidney disease in his brother; Stroke in his maternal grandmother.  Review of Systems  Constitutional: Negative for chills, diaphoresis and fever.  Respiratory: Positive  for cough, hemoptysis and sputum production. Negative for shortness of breath and wheezing.   Cardiovascular: Negative for chest pain, orthopnea and leg swelling.  Gastrointestinal: Negative for nausea.  Skin: Negative for rash.  Neurological: Negative for dizziness.    The problem list and medications were reviewed and updated by myself where necessary and exist elsewhere in the encounter.   OBJECTIVE:  BP (!) 149/78 (BP Location: Right Arm, Patient Position: Sitting, Cuff Size: Normal)   Pulse 96   Temp 98.9 F (37.2 C) (Oral)   Resp 18   Ht 5\' 5"  (1.651 m)   Wt 236 lb (107 kg)   SpO2 93%   BMI 39.27 kg/m   Wt Readings from Last 3 Encounters:  01/08/18 236 lb (107 kg)  01/03/18 233 lb 8 oz (105.9 kg)  12/19/17 232 lb 14.4 oz (105.6 kg)   Temp Readings from Last 3 Encounters:  01/08/18 98.9 F (37.2 C) (Oral)  01/03/18 98.6 F (37 C) (Oral)  12/19/17 98.5 F (36.9 C) (Oral)   BP Readings from Last 3 Encounters:  01/08/18 (!) 149/78  01/03/18 (!) 147/80  12/19/17 (!) 148/68   Pulse Readings from Last 3 Encounters:  01/08/18 96  01/03/18 69  12/19/17 81   Lab Results  Component Value Date   HGBA1C 5.4 08/24/2017     Physical Exam  Constitutional: He appears well-developed. He is active and cooperative.  Non-toxic appearance.  Cardiovascular: Normal rate, regular rhythm, S1 normal, S2 normal, normal heart sounds, intact distal pulses and normal pulses. Exam reveals no gallop and no friction rub.  No murmur heard. Pulmonary/Chest: Effort normal. No stridor. No tachypnea. No respiratory distress. He has no wheezes. He has no rales.  Abdominal: He exhibits no distension.  Musculoskeletal: He exhibits no edema.  Neurological: He is alert.  Skin: Skin is warm and dry. He is not diaphoretic. No pallor.  Vitals reviewed.   No results found for this or any previous visit (from the past 72 hour(s)).  Dg Chest 2 View  Result Date: 01/08/2018 CLINICAL DATA:   Hemoptysis. EXAM: CHEST  2 VIEW COMPARISON:  01/01/2018 FINDINGS: The heart size and mediastinal contours are within normal limits. Both lungs are clear. The visualized skeletal structures are unremarkable. IMPRESSION: No active cardiopulmonary disease. Electronically Signed   By: Kerby Moors M.D.   On: 01/08/2018 11:02    ASSESSMENT AND PLAN:  [name] was seen today for sore throat.  Diagnoses and all orders for this visit:  Coughing blood: Sore throat and cough.  He is immunocompromised and is in the midst of a cancer work up.  I am going to cover for CAP and strep throat.  Chest rads negative and exam reassuring.  Throat culture out. Starting azith today.  -     DG Chest 2 View; Future -     POCT rapid strep A -     Culture, Group A Strep -     azithromycin (ZITHROMAX) 250 MG tablet; Take two tabs on day one and one daily thereafter.    The patient is advised to call or return to clinic if he does not see an improvement in symptoms, or to seek the care of the closest emergency department if he worsens with the above plan.   [name], MHS, PA-C [facility] 01/08/2018 11:20 AM

## 2018-01-08 NOTE — Telephone Encounter (Signed)
Received phone call from [name] at [facility] requesting clarification regarding biopsy requested by Dr. Irene Limbo. Spoke with physician and called back to explain that he would like and excisional lymph node biopsy most likely from the L neck to diagnose potential marginal zone lymphoma. Most recent OV note and PET scan faxed to [phone]. Confirmed fax receipt 01/08/18 at 1203.

## 2018-01-08 NOTE — Patient Instructions (Addendum)
Take two tylenol every 8 hours for pain.  Keep up the tramadol as prescribed for pain.   IF you received an x-ray today, you will receive an invoice from [facility]. Please contact [facility] at [phone] with questions or concerns regarding your invoice.   IF you received labwork today, you will receive an invoice from [facility]. Please contact [facility] at [phone] with questions or concerns regarding your invoice.   Our billing staff will not be able to assist you with questions regarding bills from these companies.  You will be contacted with the lab results as soon as they are available. The fastest way to get your results is to activate your My Chart account. Instructions are located on the last page of this paperwork. If you have not heard from [place] regarding the results in 2 weeks, please contact this office.

## 2018-01-10 LAB — CULTURE, GROUP A STREP: STREP A CULTURE: NEGATIVE

## 2018-01-13 DIAGNOSIS — K119 Disease of salivary gland, unspecified: Secondary | ICD-10-CM | POA: Diagnosis not present

## 2018-01-13 DIAGNOSIS — C8588 Other specified types of non-Hodgkin lymphoma, lymph nodes of multiple sites: Secondary | ICD-10-CM | POA: Diagnosis not present

## 2018-01-17 ENCOUNTER — Inpatient Hospital Stay: Payer: Medicare HMO | Admitting: Hematology

## 2018-01-17 ENCOUNTER — Telehealth: Payer: Self-pay | Admitting: Hematology

## 2018-01-17 NOTE — Patient Instructions (Signed)
Thank you for choosing [facility] to provide your oncology and hematology care.  To afford each patient quality time with our providers, please arrive 30 minutes before your scheduled appointment time.  If you arrive late for your appointment, you may be asked to reschedule.  We strive to give you quality time with our providers, and arriving late affects you and other patients whose appointments are after yours.   If you are a no show for multiple scheduled visits, you may be dismissed from the clinic at the providers discretion.    Again, thank you for choosing [facility], our hope is that these requests will decrease the amount of time that you wait before being seen by our physicians.  ______________________________________________________________________  Should you have questions after your visit to the [facility], please contact our office at [phone] between the hours of 8:30 and 4:30 p.m.    Voicemails left after 4:30p.m will not be returned until the following business day.    For prescription refill requests, please have your pharmacy contact us directly.  Please also try to allow 48 hours for prescription requests.    Please contact the scheduling department for questions regarding scheduling.  For scheduling of procedures such as PET scans, CT scans, MRI, Ultrasound, etc please contact central scheduling at [phone].    Resources For Cancer Patients and Caregivers:   Oncolink.org:  A wonderful resource for patients and healthcare providers for information regarding your disease, ways to tract your treatment, what to expect, etc.     American Cancer Society:  800-227-2345  Can help patients locate various types of support and financial assistance  Cancer Care: 1-800-813-HOPE (4673) Provides financial assistance, online support groups, medication/co-pay assistance.    [place] DSS:  336-641-3447 Where to apply for food  stamps, Medicaid, and utility assistance  Medicare Rights Center: 800-333-4114 Helps people with Medicare understand their rights and benefits, navigate the Medicare system, and secure the quality healthcare they deserve  SCAT: 336-333-6589 [place] [employer]'s shared-ride transportation service for eligible riders who have a disability that prevents them from riding the fixed route bus.    For additional information on assistance programs please contact our social worker:   Grier Hock/Abigail Elmore:  [phone]            

## 2018-01-17 NOTE — Telephone Encounter (Signed)
Gave patient avs and calendar with appts per 3/8 los.

## 2018-01-19 NOTE — Progress Notes (Signed)
This encounter was created in error - please disregard.

## 2018-01-21 NOTE — Pre-Procedure Instructions (Addendum)
JASON FRISBEE  01/21/2018      [place], [place] [address] [place] [address] Phone: [phone] Fax: [phone]    Your procedure is scheduled on January 24, 2018  Report to [facility] Admitting at 530 AM.  Call this number if you have problems the morning of surgery:  [phone]   Remember:  Do not eat food or drink liquids after midnight.  Take these medicines the morning of surgery with A SIP OF WATER metoprolol (toprol XL), primidone (mysoline), ranitidine (zantac), tramadol (ultram)-if needed for pain, gabapentin (neurontin),.  7 days prior to surgery STOP taking any Aspirin (unless otherwise instructed by your surgeon), Aleve, Naproxen, Ibuprofen, Motrin, Advil, Goody's, BC's, all herbal medications, fish oil, and all vitamins  Continue all other medications as instructed by your physician except follow the above medication instructions before surgery   Do not wear jewelry.  Do not wear lotions, powders, or colognes, or deodorant.   Men may shave face and neck.  Do not bring valuables to the hospital.  [facility] is not responsible for any belongings or valuables.  Contacts, dentures or bridgework may not be worn into surgery.  Leave your suitcase in the car.  After surgery it may be brought to your room.  For patients admitted to the hospital, discharge time will be determined by your treatment team.  Patients discharged the day of surgery will not be allowed to drive home.   Special instructions:   [facility]- Preparing For Surgery  Before surgery, you can play an important role. Because skin is not sterile, your skin needs to be as free of germs as possible. You can reduce the number of germs on your skin by washing with CHG (chlorahexidine gluconate) Soap before surgery.  CHG is an antiseptic cleaner which kills germs and bonds with the skin to continue killing germs even after  washing.  Please do not use if you have an allergy to CHG or antibacterial soaps. If your skin becomes reddened/irritated stop using the CHG.  Do not shave (including legs and underarms) for at least 48 hours prior to first CHG shower. It is OK to shave your face.  Please follow these instructions carefully.   1. Shower the NIGHT BEFORE SURGERY and the MORNING OF SURGERY with CHG.   2. If you chose to wash your hair, wash your hair first as usual with your normal shampoo.  3. After you shampoo, rinse your hair and body thoroughly to remove the shampoo.  4. Use CHG as you would any other liquid soap. You can apply CHG directly to the skin and wash gently with a scrungie or a clean washcloth.   5. Apply the CHG Soap to your body ONLY FROM THE NECK DOWN.  Do not use on open wounds or open sores. Avoid contact with your eyes, ears, mouth and genitals (private parts). Wash Face and genitals (private parts)  with your normal soap.  6. Wash thoroughly, paying special attention to the area where your surgery will be performed.  7. Thoroughly rinse your body with warm water from the neck down.  8. DO NOT shower/wash with your normal soap after using and rinsing off the CHG Soap.  9. Pat yourself dry with a CLEAN TOWEL.  10. Wear CLEAN PAJAMAS to bed the night before surgery, wear comfortable clothes the morning of surgery  11. Place CLEAN SHEETS on your bed the night of  your first shower and DO NOT SLEEP WITH PETS.  Day of Surgery: Do not apply any deodorants/lotions. Please wear clean clothes to the hospital/surgery center.    Please read over the following fact sheets that you were given. Pain Booklet, Coughing and Deep Breathing and Surgical Site Infection Prevention

## 2018-01-21 NOTE — Progress Notes (Addendum)
PCP: Delman Cheadle, MD  Cardiologist: pt denies  EKG: 05/06/17 in [facility]  Stress test: pt denies ever  ECHO: pt denies ever  Cardiac Cath: pt denies ever  Chest x-ray: 01/08/18 in [facility]

## 2018-01-22 ENCOUNTER — Encounter (HOSPITAL_COMMUNITY)
Admission: RE | Admit: 2018-01-22 | Discharge: 2018-01-22 | Disposition: A | Discharge status: Home / Self Care | Payer: Medicare HMO | Source: Ambulatory Visit | Attending: Otolaryngology | Admitting: Otolaryngology

## 2018-01-22 ENCOUNTER — Encounter (HOSPITAL_COMMUNITY): Payer: Self-pay

## 2018-01-22 ENCOUNTER — Other Ambulatory Visit: Payer: Self-pay

## 2018-01-22 DIAGNOSIS — G4730 Sleep apnea, unspecified: Secondary | ICD-10-CM | POA: Diagnosis not present

## 2018-01-22 DIAGNOSIS — Z7982 Long term (current) use of aspirin: Secondary | ICD-10-CM | POA: Diagnosis not present

## 2018-01-22 DIAGNOSIS — Z79899 Other long term (current) drug therapy: Secondary | ICD-10-CM | POA: Diagnosis not present

## 2018-01-22 DIAGNOSIS — I10 Essential (primary) hypertension: Secondary | ICD-10-CM | POA: Diagnosis not present

## 2018-01-22 DIAGNOSIS — Z87891 Personal history of nicotine dependence: Secondary | ICD-10-CM | POA: Diagnosis not present

## 2018-01-22 DIAGNOSIS — K118 Other diseases of salivary glands: Secondary | ICD-10-CM | POA: Diagnosis present

## 2018-01-22 DIAGNOSIS — D47Z9 Other specified neoplasms of uncertain behavior of lymphoid, hematopoietic and related tissue: Secondary | ICD-10-CM | POA: Diagnosis not present

## 2018-01-22 DIAGNOSIS — Z791 Long term (current) use of non-steroidal anti-inflammatories (NSAID): Secondary | ICD-10-CM | POA: Diagnosis not present

## 2018-01-22 DIAGNOSIS — K219 Gastro-esophageal reflux disease without esophagitis: Secondary | ICD-10-CM | POA: Diagnosis not present

## 2018-01-22 DIAGNOSIS — Z7952 Long term (current) use of systemic steroids: Secondary | ICD-10-CM | POA: Diagnosis not present

## 2018-01-22 LAB — BASIC METABOLIC PANEL
ANION GAP: 12 (ref 5–15)
BUN: 20 mg/dL (ref 6–20)
CO2: 27 mmol/L (ref 22–32)
Calcium: 8.8 mg/dL — ABNORMAL LOW (ref 8.9–10.3)
Chloride: 98 mmol/L — ABNORMAL LOW (ref 101–111)
Creatinine, Ser: 1.25 mg/dL — ABNORMAL HIGH (ref 0.61–1.24)
GFR, EST NON AFRICAN AMERICAN: 55 mL/min — AB (ref >60)
Glucose, Bld: 110 mg/dL — ABNORMAL HIGH (ref 65–99)
POTASSIUM: 3.6 mmol/L (ref 3.5–5.1)
SODIUM: 137 mmol/L (ref 135–145)

## 2018-01-22 LAB — CBC
HEMATOCRIT: 41.9 % (ref 39.0–52.0)
HEMOGLOBIN: 14 g/dL (ref 13.0–17.0)
MCH: 33.5 pg (ref 26.0–34.0)
MCHC: 33.4 g/dL (ref 30.0–36.0)
MCV: 100.2 fL — ABNORMAL HIGH (ref 78.0–100.0)
Platelets: 257 10*3/uL (ref 150–400)
RBC: 4.18 MIL/uL — AB (ref 4.22–5.81)
RDW: 13.7 % (ref 11.5–15.5)
WBC: 7.4 10*3/uL (ref 4.0–10.5)

## 2018-01-22 LAB — NO BLOOD PRODUCTS

## 2018-01-23 NOTE — Progress Notes (Signed)
Anesthesia Chart Review:  Pt is a 72 year old male scheduled for incisional biopsy of left parotid (for suspected lymphoma) on 01/24/2018 with Lind Guest, MD\  Pt is Bryan [name] and refuses blood products  - PCP is Delman Cheadle, MD - Oncologist is Sullivan Lone, MD  PMH includes:  HTN, RBBB, OSA, seizures (as a child), anemia, GERD. Has B hearing aids. Former smoker (quit 1974). BMI 37.5.  - S/p lumbar decompression 01/18/16. S/p lumbar laminectomy 02/15/15. S/p cholecystectomy 12/07/13. S/p ACDF 03/30/13  Medications includes: ASA 81mg , hctz, methotrexate, metoprolol, primidone, zantac  BP (!) 160/86   Pulse 81   Temp 36.9 C   Resp 18   Ht 5' 5.5" (1.664 m)   Wt 229 lb 4.8 oz (104 kg)   SpO2 95%   BMI 37.58 kg/m   Preoperative labs reviewed.    CXR 01/08/18: No active cardiopulmonary disease.  EKG 05/06/17: Sinus Rhythm with frequent PACs; # PACs = 2. RBBB. LAFB  If no changes, I anticipate pt can proceed with surgery as scheduled.   Willeen Cass, FNP-BC [facility]/Anesthesiology Phone: [phone] 01/23/2018 9:36 AM

## 2018-01-24 ENCOUNTER — Ambulatory Visit (HOSPITAL_COMMUNITY)
Admission: RE | Admit: 2018-01-24 | Discharge: 2018-01-25 | Disposition: A | Discharge status: Home / Self Care | Payer: Medicare HMO | Source: Ambulatory Visit | Attending: Otolaryngology | Admitting: Otolaryngology

## 2018-01-24 ENCOUNTER — Encounter (HOSPITAL_COMMUNITY)
Admission: RE | Disposition: A | Discharge status: Home / Self Care | Payer: Self-pay | Source: Ambulatory Visit | Attending: Otolaryngology

## 2018-01-24 ENCOUNTER — Ambulatory Visit (HOSPITAL_COMMUNITY): Payer: Medicare HMO | Admitting: Certified Registered Nurse Anesthetist

## 2018-01-24 ENCOUNTER — Encounter (HOSPITAL_COMMUNITY): Payer: Self-pay | Admitting: *Deleted

## 2018-01-24 ENCOUNTER — Other Ambulatory Visit: Payer: Self-pay

## 2018-01-24 DIAGNOSIS — K118 Other diseases of salivary glands: Secondary | ICD-10-CM | POA: Diagnosis not present

## 2018-01-24 DIAGNOSIS — D47Z9 Other specified neoplasms of uncertain behavior of lymphoid, hematopoietic and related tissue: Secondary | ICD-10-CM | POA: Insufficient documentation

## 2018-01-24 DIAGNOSIS — Z7982 Long term (current) use of aspirin: Secondary | ICD-10-CM | POA: Diagnosis not present

## 2018-01-24 DIAGNOSIS — K119 Disease of salivary gland, unspecified: Secondary | ICD-10-CM | POA: Diagnosis not present

## 2018-01-24 DIAGNOSIS — G4730 Sleep apnea, unspecified: Secondary | ICD-10-CM | POA: Diagnosis not present

## 2018-01-24 DIAGNOSIS — Z7952 Long term (current) use of systemic steroids: Secondary | ICD-10-CM | POA: Insufficient documentation

## 2018-01-24 DIAGNOSIS — Z79899 Other long term (current) drug therapy: Secondary | ICD-10-CM | POA: Diagnosis not present

## 2018-01-24 DIAGNOSIS — Z791 Long term (current) use of non-steroidal anti-inflammatories (NSAID): Secondary | ICD-10-CM | POA: Diagnosis not present

## 2018-01-24 DIAGNOSIS — C8590 Non-Hodgkin lymphoma, unspecified, unspecified site: Secondary | ICD-10-CM | POA: Diagnosis not present

## 2018-01-24 DIAGNOSIS — K219 Gastro-esophageal reflux disease without esophagitis: Secondary | ICD-10-CM | POA: Insufficient documentation

## 2018-01-24 DIAGNOSIS — Z87891 Personal history of nicotine dependence: Secondary | ICD-10-CM | POA: Diagnosis not present

## 2018-01-24 DIAGNOSIS — I10 Essential (primary) hypertension: Secondary | ICD-10-CM | POA: Insufficient documentation

## 2018-01-24 DIAGNOSIS — C8588 Other specified types of non-Hodgkin lymphoma, lymph nodes of multiple sites: Secondary | ICD-10-CM | POA: Diagnosis not present

## 2018-01-24 DIAGNOSIS — N400 Enlarged prostate without lower urinary tract symptoms: Secondary | ICD-10-CM | POA: Diagnosis not present

## 2018-01-24 DIAGNOSIS — D479 Neoplasm of uncertain behavior of lymphoid, hematopoietic and related tissue, unspecified: Secondary | ICD-10-CM | POA: Diagnosis not present

## 2018-01-24 HISTORY — PX: PAROTIDECTOMY: SHX2163

## 2018-01-24 SURGERY — EXCISION, PAROTID GLAND
Anesthesia: General | Site: Neck | Laterality: Left

## 2018-01-24 MED ORDER — ONDANSETRON HCL 4 MG/2ML IJ SOLN
INTRAMUSCULAR | Status: DC | PRN
  Administered 2018-01-24: 4 mg via INTRAVENOUS

## 2018-01-24 MED ORDER — METOPROLOL SUCCINATE ER 50 MG PO TB24
50.0000 mg | ORAL_TABLET | Freq: Every day | ORAL | Status: DC
  Administered 2018-01-24 – 2018-01-25 (×2): 50 mg via ORAL
  Filled 2018-01-24 (×3): qty 1

## 2018-01-24 MED ORDER — PHENYLEPHRINE HCL 10 MG/ML IJ SOLN
INTRAVENOUS | Status: DC | PRN
  Administered 2018-01-24: 40 ug/min via INTRAVENOUS

## 2018-01-24 MED ORDER — TAMSULOSIN HCL 0.4 MG PO CAPS
0.4000 mg | ORAL_CAPSULE | Freq: Every day | ORAL | Status: DC
  Administered 2018-01-24: 0.4 mg via ORAL
  Filled 2018-01-24: qty 1

## 2018-01-24 MED ORDER — CEFAZOLIN SODIUM 1 G IJ SOLR
INTRAMUSCULAR | Status: AC
  Filled 2018-01-24: qty 20

## 2018-01-24 MED ORDER — DEXAMETHASONE SODIUM PHOSPHATE 10 MG/ML IJ SOLN
INTRAMUSCULAR | Status: AC
  Filled 2018-01-24: qty 1

## 2018-01-24 MED ORDER — TRAMADOL HCL 50 MG PO TABS
50.0000 mg | ORAL_TABLET | Freq: Three times a day (TID) | ORAL | Status: DC | PRN
  Administered 2018-01-24 – 2018-01-25 (×3): 50 mg via ORAL
  Filled 2018-01-24 (×3): qty 1

## 2018-01-24 MED ORDER — ONDANSETRON HCL 4 MG/2ML IJ SOLN: 4.0000 mg | INTRAMUSCULAR | Status: DC | PRN

## 2018-01-24 MED ORDER — ACETAMINOPHEN 650 MG RE SUPP: 650.0000 mg | RECTAL | Status: DC | PRN

## 2018-01-24 MED ORDER — ONDANSETRON HCL 4 MG/2ML IJ SOLN
INTRAMUSCULAR | Status: AC
  Filled 2018-01-24: qty 2

## 2018-01-24 MED ORDER — OXYCODONE HCL 5 MG/5ML PO SOLN: 5.0000 mg | Freq: Once | ORAL | Status: DC | PRN

## 2018-01-24 MED ORDER — PHENYLEPHRINE 40 MCG/ML (10ML) SYRINGE FOR IV PUSH (FOR BLOOD PRESSURE SUPPORT)
PREFILLED_SYRINGE | INTRAVENOUS | Status: DC | PRN
  Administered 2018-01-24 (×2): 80 ug via INTRAVENOUS

## 2018-01-24 MED ORDER — DEXTROSE-NACL 5-0.9 % IV SOLN
INTRAVENOUS | Status: DC
  Administered 2018-01-24 – 2018-01-25 (×2): via INTRAVENOUS

## 2018-01-24 MED ORDER — LIDOCAINE 2% (20 MG/ML) 5 ML SYRINGE
INTRAMUSCULAR | Status: DC | PRN
  Administered 2018-01-24: 70 mg via INTRAVENOUS

## 2018-01-24 MED ORDER — SUCCINYLCHOLINE CHLORIDE 200 MG/10ML IV SOSY
PREFILLED_SYRINGE | INTRAVENOUS | Status: AC
  Filled 2018-01-24: qty 10

## 2018-01-24 MED ORDER — FENTANYL CITRATE (PF) 250 MCG/5ML IJ SOLN
INTRAMUSCULAR | Status: DC | PRN
  Administered 2018-01-24 (×2): 50 ug via INTRAVENOUS

## 2018-01-24 MED ORDER — PRIMIDONE 50 MG PO TABS
50.0000 mg | ORAL_TABLET | Freq: Two times a day (BID) | ORAL | Status: DC
  Administered 2018-01-24 – 2018-01-25 (×2): 50 mg via ORAL
  Filled 2018-01-24 (×2): qty 1

## 2018-01-24 MED ORDER — CEFAZOLIN SODIUM-DEXTROSE 2-3 GM-%(50ML) IV SOLR
INTRAVENOUS | Status: DC | PRN
  Administered 2018-01-24: 2 g via INTRAVENOUS

## 2018-01-24 MED ORDER — OXYCODONE HCL 5 MG PO TABS: 5.0000 mg | ORAL_TABLET | Freq: Once | ORAL | Status: DC | PRN

## 2018-01-24 MED ORDER — ONDANSETRON HCL 4 MG/2ML IJ SOLN: 4.0000 mg | Freq: Four times a day (QID) | INTRAMUSCULAR | Status: DC | PRN

## 2018-01-24 MED ORDER — HYDROCHLOROTHIAZIDE 25 MG PO TABS
25.0000 mg | ORAL_TABLET | Freq: Every day | ORAL | Status: DC
  Administered 2018-01-25: 25 mg via ORAL
  Filled 2018-01-24: qty 1

## 2018-01-24 MED ORDER — FENTANYL CITRATE (PF) 100 MCG/2ML IJ SOLN: 25.0000 ug | INTRAMUSCULAR | Status: DC | PRN

## 2018-01-24 MED ORDER — GABAPENTIN 300 MG PO CAPS
300.0000 mg | ORAL_CAPSULE | Freq: Three times a day (TID) | ORAL | Status: DC
  Administered 2018-01-24 – 2018-01-25 (×3): 300 mg via ORAL
  Filled 2018-01-24 (×3): qty 1

## 2018-01-24 MED ORDER — HEMOSTATIC AGENTS (NO CHARGE) OPTIME
TOPICAL | Status: DC | PRN
  Administered 2018-01-24: 1 via TOPICAL

## 2018-01-24 MED ORDER — ROCURONIUM BROMIDE 10 MG/ML (PF) SYRINGE
PREFILLED_SYRINGE | INTRAVENOUS | Status: AC
  Filled 2018-01-24: qty 5

## 2018-01-24 MED ORDER — 0.9 % SODIUM CHLORIDE (POUR BTL) OPTIME
TOPICAL | Status: DC | PRN
  Administered 2018-01-24: 1000 mL

## 2018-01-24 MED ORDER — LIDOCAINE-EPINEPHRINE 1 %-1:100000 IJ SOLN
INTRAMUSCULAR | Status: AC
  Filled 2018-01-24: qty 1

## 2018-01-24 MED ORDER — FAMOTIDINE 10 MG PO TABS
10.0000 mg | ORAL_TABLET | Freq: Two times a day (BID) | ORAL | Status: DC
  Administered 2018-01-24 – 2018-01-25 (×2): 10 mg via ORAL
  Filled 2018-01-24 (×2): qty 1

## 2018-01-24 MED ORDER — FENTANYL CITRATE (PF) 250 MCG/5ML IJ SOLN
INTRAMUSCULAR | Status: AC
Start: 2018-01-24 — End: 2018-01-24
  Filled 2018-01-24: qty 5

## 2018-01-24 MED ORDER — ACETAMINOPHEN 160 MG/5ML PO SOLN
650.0000 mg | ORAL | Status: DC | PRN
  Administered 2018-01-24: 650 mg via ORAL
  Filled 2018-01-24: qty 20.3

## 2018-01-24 MED ORDER — LIDOCAINE-EPINEPHRINE 1 %-1:100000 IJ SOLN
INTRAMUSCULAR | Status: DC | PRN
  Administered 2018-01-24: 20 mL via INTRADERMAL

## 2018-01-24 MED ORDER — DEXAMETHASONE SODIUM PHOSPHATE 10 MG/ML IJ SOLN
INTRAMUSCULAR | Status: DC | PRN
Start: 2018-01-24 — End: 2018-01-24
  Administered 2018-01-24: 10 mg via INTRAVENOUS

## 2018-01-24 MED ORDER — LIDOCAINE HCL (CARDIAC) 20 MG/ML IV SOLN
INTRAVENOUS | Status: AC
Start: 2018-01-24 — End: 2018-01-24
  Filled 2018-01-24: qty 5

## 2018-01-24 MED ORDER — SUCCINYLCHOLINE CHLORIDE 200 MG/10ML IV SOSY
PREFILLED_SYRINGE | INTRAVENOUS | Status: DC | PRN
  Administered 2018-01-24: 100 mg via INTRAVENOUS

## 2018-01-24 MED ORDER — LACTATED RINGERS IV SOLN
INTRAVENOUS | Status: DC | PRN
  Administered 2018-01-24 (×2): via INTRAVENOUS

## 2018-01-24 MED ORDER — ONDANSETRON HCL 4 MG PO TABS: 4.0000 mg | ORAL_TABLET | ORAL | Status: DC | PRN

## 2018-01-24 MED ORDER — PROPOFOL 10 MG/ML IV BOLUS
INTRAVENOUS | Status: DC | PRN
  Administered 2018-01-24: 180 mg via INTRAVENOUS

## 2018-01-24 SURGICAL SUPPLY — 54 items
ADH SKN CLS APL DERMABOND .7 (GAUZE/BANDAGES/DRESSINGS) ×1
ATTRACTOMAT 16X20 MAGNETIC DRP (DRAPES) IMPLANT
BLADE CLIPPER SURG (BLADE) IMPLANT
BLADE SURG 15 STRL LF DISP TIS (BLADE) IMPLANT
BLADE SURG 15 STRL SS (BLADE)
CABLE BIPOLOR RESECTION CORD (MISCELLANEOUS) ×2 IMPLANT
CANISTER SUCT 3000ML PPV (MISCELLANEOUS) ×2 IMPLANT
CLEANER TIP ELECTROSURG 2X2 (MISCELLANEOUS) ×2 IMPLANT
CONT SPEC 4OZ CLIKSEAL STRL BL (MISCELLANEOUS) ×2 IMPLANT
COVER SURGICAL LIGHT HANDLE (MISCELLANEOUS) ×2 IMPLANT
DERMABOND ADVANCED (GAUZE/BANDAGES/DRESSINGS) ×1
DERMABOND ADVANCED .7 DNX12 (GAUZE/BANDAGES/DRESSINGS) ×1 IMPLANT
DRAIN JACKSON RD 7FR 3/32 (WOUND CARE) IMPLANT
DRAIN PENROSE 1/4X12 LTX STRL (WOUND CARE) IMPLANT
DRAIN SNY 10 ROU (WOUND CARE) IMPLANT
DRAPE HALF SHEET 40X57 (DRAPES) IMPLANT
DRAPE SURG 17X23 STRL (DRAPES) ×2 IMPLANT
DRSG TEGADERM 2-3/8X2-3/4 SM (GAUZE/BANDAGES/DRESSINGS) ×10 IMPLANT
ELECT COATED BLADE 2.86 ST (ELECTRODE) ×2 IMPLANT
ELECT PAIRED SUBDERMAL (MISCELLANEOUS) ×2
ELECT REM PT RETURN 9FT ADLT (ELECTROSURGICAL) ×2
ELECTRODE PAIRED SUBDERMAL (MISCELLANEOUS) ×1 IMPLANT
ELECTRODE REM PT RTRN 9FT ADLT (ELECTROSURGICAL) ×1 IMPLANT
EVACUATOR SILICONE 100CC (DRAIN) IMPLANT
GAUZE SPONGE 4X4 16PLY XRAY LF (GAUZE/BANDAGES/DRESSINGS) ×2 IMPLANT
GLOVE BIOGEL M 7.0 STRL (GLOVE) ×2 IMPLANT
GOWN STRL REUS W/ TWL LRG LVL3 (GOWN DISPOSABLE) ×2 IMPLANT
GOWN STRL REUS W/TWL LRG LVL3 (GOWN DISPOSABLE) ×2
HEMOSTAT ARISTA ABSORB 3G PWDR (MISCELLANEOUS) ×2 IMPLANT
KIT BASIN OR (CUSTOM PROCEDURE TRAY) ×2 IMPLANT
KIT ROOM TURNOVER OR (KITS) ×2 IMPLANT
NEEDLE HYPO 25GX1X1/2 BEV (NEEDLE) IMPLANT
NS IRRIG 1000ML POUR BTL (IV SOLUTION) ×2 IMPLANT
PAD ARMBOARD 7.5X6 YLW CONV (MISCELLANEOUS) ×4 IMPLANT
PENCIL BUTTON HOLSTER BLD 10FT (ELECTRODE) ×2 IMPLANT
PROBE NERVBE PRASS .33 (MISCELLANEOUS) ×2 IMPLANT
SHEARS HARMONIC 9CM CVD (BLADE) ×2 IMPLANT
SPONGE INTESTINAL PEANUT (DISPOSABLE) IMPLANT
STAPLER VISISTAT 35W (STAPLE) ×2 IMPLANT
SUT ETHILON 3 0 PS 1 (SUTURE) IMPLANT
SUT ETHILON 5 0 P 3 18 (SUTURE)
SUT ETHILON 6 0 P 1 (SUTURE) IMPLANT
SUT MON AB 5-0 PS2 18 (SUTURE) ×2 IMPLANT
SUT NYLON ETHILON 5-0 P-3 1X18 (SUTURE) IMPLANT
SUT SILK 2 0 PERMA HAND 18 BK (SUTURE) IMPLANT
SUT SILK 2 0 REEL (SUTURE) ×2 IMPLANT
SUT SILK 3 0 REEL (SUTURE) ×2 IMPLANT
SUT VIC AB 3-0 SH 27 (SUTURE) ×2
SUT VIC AB 3-0 SH 27X BRD (SUTURE) ×1 IMPLANT
SUT VIC AB 4-0 PS2 27 (SUTURE) ×2 IMPLANT
SUT VIC AB 5-0 P-3 18XBRD (SUTURE) ×1 IMPLANT
SUT VIC AB 5-0 P3 18 (SUTURE) ×1
SUT VICRYL 4-0 PS2 18IN ABS (SUTURE) ×2 IMPLANT
TRAY ENT MC OR (CUSTOM PROCEDURE TRAY) ×2 IMPLANT

## 2018-01-24 NOTE — Op Note (Signed)
DATE OF PROCEDURE:  01/24/2018    PRE-OPERATIVE DIAGNOSIS: LEFT PAROTID MASS, LYMPHOMA    POST-OPERATIVE DIAGNOSIS:  Same    PROCEDURE(S): Incisional biopsy of left parotid mass   SURGEON: Gavin Pound, MD    ASSISTANT(S): none    ANESTHESIA: General endotracheal anesthesia      ESTIMATED BLOOD LOSS: 15 mL   SPECIMENS: Left parotid mass    COMPLICATIONS: None    OPERATIVE FINDINGS: There was a left parotid mass at the lateral inferior portion of the gland which extended up to parotid fascia. The facial nerve was not encountered.     OPERATIVE DETAILS: The patient was brought to the operating room and placed in the supine position. General anesthesia was induced. The NIM monitor was secured to monitor the left facial nerve. This was confirmed to be working. A modified Blair incision was designed anterior to the left auricle. 1% lidocaine with 1:100,000 epinephrine was used peri-incisionally. Incision was made with a 15 blade. Blunt dissection was carried through the platysma inferiorly. A skin flap was raised anteriorly. Blunt dissection was carried down to the mass. An incisional biopsy was obtained sharply and sent for lymphoma protocol and passed off the field. The wound was copiously irrigated. Hemostasis was achieved with bipolar cautery. Arista was placed. Platysmal layer was closed with 3-0 Vicryl interrupted suture, subcutaneous tissues with buried, interrupted 4-0 Vicryl. Dermis was closed with running 5-0 monocryl subcuticular stitch and skin with Dermabond. All instrumentation was removed, counts were verified. The skin was cleansed. The patient was then awakened, extubated, and transported to PACU in good condition.

## 2018-01-24 NOTE — Progress Notes (Signed)
Received from PACU via bed. Alert,oriented x4. IV infusing well. Post-op site clean and dry. No signs of distress

## 2018-01-24 NOTE — Transfer of Care (Signed)
Immediate Anesthesia Transfer of Care Note  Patient: Bryan [name]  Procedure(s) Performed: Fatima Blank BIOPSY OF LEFT PAROTID    MASS (Left Neck)  Patient Location: PACU  Anesthesia Type:General  Level of Consciousness: awake, alert  and oriented  Airway & Oxygen Therapy: Patient Spontanous Breathing and Patient connected to face mask oxygen  Post-op Assessment: Report given to RN, Post -op Vital signs reviewed and stable and Patient moving all extremities  Post vital signs: Reviewed and stable  Last Vitals:  Vitals:   01/24/18 0543  BP: (!) 158/95  Pulse: 93  Resp: 20  Temp: 37.2 C  SpO2: 94%    Last Pain:  Vitals:   01/24/18 0640  TempSrc:   PainSc: 10-Worst pain ever         Complications: No apparent anesthesia complications

## 2018-01-24 NOTE — Discharge Instructions (Signed)
-  You skin has been closed with skin glue (Dermabond). Do not allow the skin glue to get wet or 10 days. Cover to shower or bathe.   Call if you develop swelling, redness, uncontrolled pain over your incision.   Alternate taking tylenol and ibuprofen scheduled for pain.  Take 650mg  tylenol/acetaminophen. Then, three hours later, take 600mg  ibuprofen. Do this around the clock until your pain improves.  Do not exceed 4grams of tylenol/acetaminophen in a 24 hour period.   Resume ASA in 48 hours.

## 2018-01-24 NOTE — Anesthesia Preprocedure Evaluation (Addendum)
Anesthesia Evaluation  Patient identified by MRN, date of birth, ID band Patient awake    Reviewed: Allergy & Precautions, H&P , NPO status , Patient's Chart, lab work & pertinent test results  Airway Mallampati: II  TM Distance: >3 FB Neck ROM: Limited    Dental  (+) Teeth Intact, Dental Advisory Given,    Pulmonary asthma , sleep apnea , former smoker,    breath sounds clear to auscultation       Cardiovascular hypertension,  Rhythm:regular Rate:Normal     Neuro/Psych    GI/Hepatic GERD  ,  Endo/Other  obese  Renal/GU      Musculoskeletal  (+) Arthritis ,   Abdominal   Peds  Hematology  (+) JEHOVAH'S WITNESS  Anesthesia Other Findings   Reproductive/Obstetrics                            Anesthesia Physical Anesthesia Plan  ASA: II  Anesthesia Plan: General   Post-op Pain Management:    Induction: Intravenous  PONV Risk Score and Plan: 2 and Ondansetron, Dexamethasone and Treatment may vary due to age or medical condition  Airway Management Planned: Oral ETT  Additional Equipment:   Intra-op Plan:   Post-operative Plan: Extubation in OR  Informed Consent: I have reviewed the patients History and Physical, chart, labs and discussed the procedure including the risks, benefits and alternatives for the proposed anesthesia with the patient or authorized representative who has indicated his/her understanding and acceptance.     Plan Discussed with: CRNA, Anesthesiologist and Surgeon  Anesthesia Plan Comments:         Anesthesia Quick Evaluation

## 2018-01-24 NOTE — Anesthesia Procedure Notes (Addendum)
Procedure Name: Intubation Date/Time: 01/24/2018 7:52 AM Performed by: Harden Mo, CRNA Pre-anesthesia Checklist: Patient identified, Emergency Drugs available, Suction available and Patient being monitored Patient Re-evaluated:Patient Re-evaluated prior to induction Oxygen Delivery Method: Circle system utilized Preoxygenation: Pre-oxygenation with 100% oxygen Induction Type: IV induction and Rapid sequence Ventilation: Oral airway inserted - appropriate to patient size and Mask ventilation without difficulty Laryngoscope Size: Glidescope and 4 Grade View: Grade I Tube type: Oral Tube size: 7.5 mm Number of attempts: 1 Airway Equipment and Method: Stylet Placement Confirmation: ETT inserted through vocal cords under direct vision,  positive ETCO2 and breath sounds checked- equal and bilateral Secured at: 23 cm Tube secured with: Tape Dental Injury: Teeth and Oropharynx as per pre-operative assessment  Comments: Intubation by Leandro Reasoner, SRNA

## 2018-01-24 NOTE — H&P (Signed)
The surgical history remains accurate and without interval change. The condition still exists which makes the procedure necessary. The patient and/or family is aware of their condition and has been informed of the risks and benefits of surgery, as well as alternatives. All parties have elected to proceed with surgery.   Surgical plan: left parotid incisional biopsy   Otolaryngology Return Patient Note  Subjective: [name] is a 72 y.o. male seen in follow up today. Last seen 10/2017 for left parotid masses. 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 Oncology is requested further biopsy of now expanding left parotid neck mass. PET CT demonstrated enlarging left parotid mass which is very close to the skin. Patient reports slight tenderness over the mass, denies dysphagia. Occasional otalgia on the left. Endorses malaise recently. No weight loss.   Here today with his wife.   Past Medical History:  Diagnosis Date  . Arthritis  . Hypertension   Past Surgical History:  Procedure Laterality Date  . BACK SURGERY   Family History  Problem Relation Age of Onset  . Heart failure Brother   Social History   Social History  . Marital status: Married  Spouse name: N/A  . Number of children: N/A  . Years of education: N/A   Occupational History  . Not on file.   Social History Main Topics  . Smoking status: Former Research scientist (life sciences)  . Smokeless tobacco: Never Used  . Alcohol use Yes  Comment: Social  . Drug use: No  . Sexual activity: Not on file   Other Topics Concern  . Not on file   Social History Narrative  . No narrative on file   Allergies  Allergen Reactions  . Lisinopril Cough (ALLERGY/intolerance)   Updated Medication List:   adalimumab (HUMIRA) 40 mg/0.8 mL injection (syringe kit)  Sig - Route: Inject into the skin. - Subcutaneous  Class: Historical Med  amLODIPine (NORVASC) 10 MG tablet  Class: Historical Med   aspirin 81 MG EC tablet *ANTIPLATELET*  Sig - Route: Take by mouth. - Oral  Class: Historical Med  diclofenac (VOLTAREN) 75 MG EC tablet  Sig - Route: Take by mouth. - Oral  Class: Historical Med  doxycycline hyclate (VIBRA-TABS) 100 MG tablet  Sig - Route: Take by mouth. - Oral  Class: Historical Med  folic acid (FOLVITE) 1 MG tablet  Sig - Route: Take by mouth. - Oral  Class: Historical Med  hydroCHLOROthiazide (HYDRODIURIL) 25 MG tablet  Class: Historical Med  methotrexate 25 mg/mL chemo syringe  Sig - Route: Inject into the skin. - Subcutaneous  Class: Historical Med  metoPROLOL succinate (TOPROL-XL) 50 MG 24 hr tablet  Sig: TAKE 1 TABLET BY MOUTH ONCE DAILY. TAKE WITH OR IMMEDIATELY FOLLOWING A MEAL  Class: Historical Med  predniSONE (DELTASONE) 5 MG tablet  Class: Historical Med  ranitidine (ZANTAC) 150 MG tablet  Sig - Route: Take by mouth. - Oral  Class: Historical Med  tamsulosin (FLOMAX) 0.4 mg Cp24 capsule  Sig - Route: Take by mouth. - Oral  Class: Historical Med  traMADol (ULTRAM) 50 mg tablet  Sig - Route: Take by mouth. - Oral  Class: Historical Med    ROS A complete review of systems was conducted and was negative except as stated in the HPI.   Objective: [name] Physical Exam:  General Normocephalic, Awake, Alert  Eyes PERRL, no scleral icterus or conjunctival hemorrhage. EOMI.  Ears  Right ear- canal patent, minimal cerumen. TM intact, no significant retractions, no middle ear effusion, normal landmarks.  Left ear- canal patent, minimal cerumen. TM intact, no significant retractions, no middle ear effusion, normal landmarks.  Nose Patent, No polyps or masses seen.  Oral Pharynx No mucosal lesions or tumors seen.  Lymphatics over the left tail of parotid there is a approximately a 2-1/2 cm mass which is mobile and very close to the skin. There is some mild tenderness in the lower neck regions.  Endocrine No  thyroidmegaly, no thyroid masses palpated  Cardio-vascular No cyanosis, cardiac auscultation - regular rate, no murmur  Pulmonary No audible stridor, Breathing easily with no labor.  Neuro Symmetric facial movement. Tongue protrudes in midline.  Psychiatry Appropriate affect and mood for clinic visit.  Skin No scars or lesions on face or neck.    PET/CT personally reviewed-my interpretation of the study is that there are several small mediastinal lymph nodes on the left, multiple level 5 lymph nodes, multiple intraparotid masses consistent with the patient's known history of a likely MALT lymphoma. The left tail of parotid mass has enlarged since I last saw him and appears to be very amenable to an incisional biopsy.    Assessment:  My impression is that Carrick has  1. 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 Parotid mass  . We discussed the risks and benefits of incisional biopsy of his left parotid mass which is a likely lymphoma based on prior FNA results and PET/CT results. We discussed the risks of facial nerve injury weakness, pain, infection, bleeding, ear numbness, need for further surgeries. He acknowledges wish to proceed with surgery    Plan:  1. Incisional biopsy in the operating room      

## 2018-01-25 DIAGNOSIS — D47Z9 Other specified neoplasms of uncertain behavior of lymphoid, hematopoietic and related tissue: Secondary | ICD-10-CM | POA: Diagnosis not present

## 2018-01-25 DIAGNOSIS — Z7952 Long term (current) use of systemic steroids: Secondary | ICD-10-CM | POA: Diagnosis not present

## 2018-01-25 DIAGNOSIS — Z791 Long term (current) use of non-steroidal anti-inflammatories (NSAID): Secondary | ICD-10-CM | POA: Diagnosis not present

## 2018-01-25 DIAGNOSIS — Z7982 Long term (current) use of aspirin: Secondary | ICD-10-CM | POA: Diagnosis not present

## 2018-01-25 DIAGNOSIS — K219 Gastro-esophageal reflux disease without esophagitis: Secondary | ICD-10-CM | POA: Diagnosis not present

## 2018-01-25 DIAGNOSIS — Z79899 Other long term (current) drug therapy: Secondary | ICD-10-CM | POA: Diagnosis not present

## 2018-01-25 DIAGNOSIS — G4730 Sleep apnea, unspecified: Secondary | ICD-10-CM | POA: Diagnosis not present

## 2018-01-25 DIAGNOSIS — I10 Essential (primary) hypertension: Secondary | ICD-10-CM | POA: Diagnosis not present

## 2018-01-25 DIAGNOSIS — Z87891 Personal history of nicotine dependence: Secondary | ICD-10-CM | POA: Diagnosis not present

## 2018-01-25 NOTE — Discharge Summary (Signed)
  Physician Discharge Summary  Patient ID: Bryan [name] MRN: [id number] DOB/AGE: 08-03-1946 72 y.o.  Admit date: 01/24/2018 Discharge date: 01/25/2018  Admission Diagnoses: Parotid mass  Discharge Diagnoses:  Active Problems:   Lymphoma ([place])   Discharged Condition: good  Hospital Course: No complications  Consults: none  Significant Diagnostic Studies: none  Treatments: surgery: Parotid biopsy  Discharge Exam: Blood pressure 128/68, pulse 66, temperature 98.5 F (36.9 C), temperature source Oral, resp. rate 17, height 5\' 6"  (1.676 m), weight 107.1 kg (236 lb 1.8 oz), SpO2 98 %. PHYSICAL EXAM: Awake and alert, breathing well.  Surgical site looks excellent.  Facial nerve intact.  Disposition: Discharge disposition: 01-Home or Self Care       Discharge Instructions    Diet - low sodium heart healthy   Complete by:  As directed    Increase activity slowly   Complete by:  As directed       Follow-up Information    Helayne Seminole, MD. Schedule an appointment as soon as possible for a visit in 1 week.   Specialty:  Otolaryngology Why:  For wound re-check Contact information: [place] [address] [phone]           Signed: Izora Gala 01/25/2018, 8:07 AM

## 2018-01-25 NOTE — Progress Notes (Signed)
Patient given discharge instructions, patient verbalized understanding. Patient left in stable condition.

## 2018-01-25 NOTE — Anesthesia Postprocedure Evaluation (Signed)
Anesthesia Post Note  Patient: Bryan [name]  Procedure(s) Performed: Fatima Blank BIOPSY OF LEFT PAROTID    MASS (Left Neck)     Patient location during evaluation: PACU Anesthesia Type: General Level of consciousness: awake and alert Pain management: pain level controlled Vital Signs Assessment: post-procedure vital signs reviewed and stable Respiratory status: spontaneous breathing, nonlabored ventilation, respiratory function stable and patient connected to nasal cannula oxygen Cardiovascular status: blood pressure returned to baseline and stable Postop Assessment: no apparent nausea or vomiting Anesthetic complications: no    Last Vitals:  Vitals:   01/25/18 0259 01/25/18 0613  BP: (!) 121/59 128/68  Pulse: 65 66  Resp: 17 17  Temp: 36.9 C 36.9 C  SpO2: 95% 98%    Last Pain:  Vitals:   01/25/18 0613  TempSrc: Oral  PainSc:    Pain Goal:                 [name] S

## 2018-01-26 ENCOUNTER — Encounter (HOSPITAL_COMMUNITY): Payer: Self-pay | Admitting: Otolaryngology

## 2018-01-28 ENCOUNTER — Other Ambulatory Visit: Payer: Self-pay | Admitting: Family Medicine

## 2018-01-29 NOTE — Progress Notes (Signed)
HEMATOLOGY ONCOLOGY CLINIC NOTE  Patient Care Team: Shawnee Knapp, MD as PCP - General (Family Medicine) [name] as Physician Assistant (Emergency Medicine) [name], MD as Consulting Physician (Orthopedic Surgery)  CHIEF COMPLAINTS F/u for newly diagnosed Marginal Zone lymphoma  HISTORY OF PRESENTING ILLNESS:   Bryan [name] 72 y.o. male is here because of a referral from ENT [name] regarding a concern of MALT lymphoma.   He is accompanied today by 4 members of his family. The pt reports that he is doing well overall. The pt's PCP is [name]. The pt sees [name] for his Rheumatology. The pt went off of humira a month before his mass appeared two months. The pt takes Methotrexate 15 mg once each week for his rheumatoid arthritis. He notes that he took humira for 8-10 months, and hasn't taken it in about 3 months. He began taking prednisone 15mg  q day, 2 months ago.   He reports having high blood pressure and takes amlodipine, metoprolol for it. He reports having had back and neck surgery for discs.  He also notes having essential tremors in his hands for which he takes primidone.  He reports having run out of flomax. He has had some PTSD from his time in the [place] war.  He notes he has had drenching night sweats for the last 15 years that are of intermittent frequency. He denies any other medical issues.    He first noticed swelling of his left cheek about two months ago. He notes that it was "hard like a rock." He was placed on prednisone for about a month after going to the doctor, with some relief. He then had his needle Bx recorded below.   He reports gum pain that began this morning, and also reports that his tongue feels as if it has bumps on it. He reports taking folic acid 1mg  each day.    On 10/29/17 the pt had a neck CT revealing Multifocal left parotid lesion. Multiple ill-defined enhancing nodules in the left parotid gland.  Asymmetric enhancing lymph nodes in the left neck are not pathologically enlarged however given the asymmetry and the left parotid lesion, they could represent neoplastic spread of parotid malignancy or lymphoma. Tissue sampling recommended.  Of note prior to the patient's visit, pt has had a biopsy of his left parotid gland completed on 12/03/17 with results revealing Atypical lymphoid proliferation. 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 -   B cell clonality study was positive for clonality.   On review of systems, pt reports gum pain, fatigue (not recent), occasional night sweats, ankle pain, and denies abdominal pains, back pain, flank pain, leg swelling, swollen or painful joints beside ankles.   On PMHx the pt has had rheumatoid arthritis for 6 years and HTN. He notes that he has anemia and is a Acupuncturist Witness I- which he notes means he would not want to consider blood products  On Surgical Hx the pt had back surgery. On Social Hx the pt quit smoking in 1976 after smoking about 8 cigarettes each day for 15 years. He notes drinking ETOH about once a week. He denies chemical or radiation exposure, except for agent orange.  On Family Hx the pt notes high blood pressure, DM, but denies autoimmune conditions, cancers, or blood disorders.   INTERVAL HISTORY:  Bryan [name]  is here for a scheduled follow-up of his newly diagnosed marginal zone lymphoma. The patient's last visit with [place] was on 01/03/18. He is accompanied today by his wife, sister, and son. The pt reports that he is doing well overall.   The pt reports that he is currently taking 15mg  Prednisone daily for his RA. He notes that his lower back pain is his most troublesome problem at this time. He notes that his RA in his left shoulder is also troublesome. He notes that he is taking 15mg  Tramadol  daily.  He notes that he has had his flu shot but is unsure if he has had pneumonia vaccines.   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   His 01/03/18 PET/CT revealed 1. Hypermetabolic lymphoma appears confined to left cervical and mediastinal lymph nodes. 2. Small left inguinal lymph nodes demonstrate nonspecific low-level activity, likely reactive. Splenic size and metabolic activity are normal. 3. Incidental findings including colonic diverticulosis and mild Aortic Atherosclerosis.  Lab results (01/22/18) of CBC, BMP is as follows: all values are WNL except for RBC at 4.18, MCV at 100.2, Lymphs Abs at 0.6k, Chloride at 98, Glucose at 110, Creatinine at 1.25, Calcium at 8.8.  On review of systems, pt reports back pain, shoulder pain, and denies abdominal pains, leg swelling and any other symptoms.   REVIEW OF SYSTEMS:    .10 Point review of Systems was done is negative except as noted above.  MEDICAL HISTORY:  Past Medical History:  Diagnosis Date  . Anemia   . Arthritis    hands, knees, cervical area. Back pain. Rheumatoid arthritis- weekly injections.  [name] BPH (benign prostatic hypertrophy)   . Essential tremor   . GERD (gastroesophageal reflux disease)    reports for indigestion he uses mustard   . Hearing deficit    wears hearing aids bilateral  . HTN (hypertension)   . Obesity   . Refusal of blood transfusions as patient is Jehovah's Witness   . Right bundle branch block    history of  . Seizures (HCC)    AS A CHILD. only esential tremors now.  . Sleep apnea     cpap - settings at 9 per patient   . Ulcer   JEHOVA's WITNESS  SURGICAL HISTORY: Past Surgical History:  Procedure Laterality Date  . ANTERIOR CERVICAL DECOMP/DISCECTOMY FUSION N/A 03/30/2013   Procedure: ANTERIOR CERVICAL DECOMPRESSION/DISCECTOMY FUSION 2 LEVELS;  Surgeon: Otilio Connors, MD;  Location: [facility];  Service: Neurosurgery;  Laterality: N/A;  C4-5 C5-6 Anterior cervical decompression/diskectomy/fusion/Allograft/Plate  . CHOLECYSTECTOMY N/A 12/07/2013   Procedure: LAPAROSCOPIC CHOLECYSTECTOMY WITH INTRAOPERATIVE CHOLANGIOGRAM;  Surgeon: Adin Hector, MD;  Location: [facility];  Service: General;  Laterality: N/A;  . COLONOSCOPY    . DECOMPRESSIVE LUMBAR LAMINECTOMY LEVEL 2 N/A 02/15/2015   Procedure: COMPLETE DECOMPRESSIVE LUMBAR LAMINECTOMY L4-L5/ FORAMINOTOMY TO L4 NERVE ROOT AND L5 NERVE ROOT BILATERALLY;  Surgeon: [name], MD;  Location: [facility];  Service: Orthopedics;  Laterality: N/A;  . EYE SURGERY     right, growth excision  . LUMBAR LAMINECTOMY/DECOMPRESSION MICRODISCECTOMY Left 01/18/2016   Procedure:  DECOMPRESSION L4-L5 MICRODISCECTOMY L4-L5 ON LEFT FOR SPINAL STENOSIS;  Surgeon: [name], MD;  Location: [facility];  Service: Orthopedics;  Laterality: Left;  . PAROTIDECTOMY Left 01/24/2018   Procedure: INCISIONAL BIOPSY OF LEFT PAROTID    MASS;  Surgeon: Helayne Seminole, MD;  Location: [facility];  Service: ENT;  Laterality: Left;  . SPINE SURGERY    . TONSILLECTOMY    . VASECTOMY    . WRIST GANGLION EXCISION Left     SOCIAL HISTORY: Social History   Socioeconomic History  . Marital status: Married    Spouse name: Not on file  . Number of children: 2  . Years of education: Not on file  . Highest education level: Not on file  Social Needs  . Financial resource strain: Not on file  . Food insecurity - worry: Not on file  . Food insecurity - inability: Not on file  . Transportation needs - medical: Not on file  . Transportation needs - non-medical:  Not on file  Occupational History  . Occupation: bus Education administrator: RETIRED  Tobacco Use  . Smoking status: Former Smoker    Types: Cigarettes    Last attempt to quit: 12/14/1972    Years since quitting: 45.1  . Smokeless tobacco: Never Used  Substance and Sexual Activity  . Alcohol use: Yes    Comment: occasionally  . Drug use: No  . Sexual activity: Yes    Birth control/protection: None  Other Topics Concern  . Not on file  Social History Narrative  . Not on file    FAMILY HISTORY: Family History  Problem Relation Age of Onset  . Colon cancer Maternal Grandfather   . Kidney disease Brother   . Stroke Maternal Grandmother   . Diabetes Paternal Aunt        x 4 aunts    ALLERGIES:  is allergic to lisinopril and other.  MEDICATIONS:  Current Outpatient Medications  Medication Sig Dispense Refill  . Adalimumab (HUMIRA PEN [place]) Inject 1 application into the skin every 14 (fourteen) days.    [name] aspirin EC 81 MG tablet Take 81 mg by mouth daily.    [name] gabapentin (NEURONTIN) 300 MG capsule TAKE 1 CAPSULE BY MOUTH THREE TIMES DAILY 90 capsule 0  . hydrochlorothiazide (HYDRODIURIL) 25 MG tablet TAKE 1 TABLET BY MOUTH ONCE DAILY (Patient taking differently: TAKE 25 MG BY MOUTH ONCE DAILY) 30 tablet 6  . Methotrexate, PF, 25 MG/0.5ML SOAJ Inject 1 application into the skin once a week.    . metoprolol succinate (TOPROL-XL) 50 MG 24 hr tablet TAKE 1 TABLET BY MOUTH ONCE DAILY. TAKE WITH OR IMMEDIATELY FOLLOWING A MEAL (Patient taking differently: TAKE 50 MG BY MOUTH ONCE DAILY. TAKE WITH OR IMMEDIATELY FOLLOWING A MEAL) 90 tablet 3  . primidone (MYSOLINE) 50 MG tablet Take 50 mg by mouth 2 (two) times daily.     . ranitidine (ZANTAC) 150 MG tablet Take 1 tablet (150 mg total) by mouth 2 (two) times daily. (Patient taking differently: Take 150 mg by mouth daily. ) 60 tablet 0  . Tamsulosin HCl (FLOMAX) 0.4 MG CAPS Take 0.4 mg by mouth at bedtime.     . traMADol (ULTRAM) 50 MG tablet  Take 1 tablet (50 mg total) every 8 (eight) hours as needed by mouth for severe pain. 30 tablet 0   No current facility-administered medications for this visit.     PHYSICAL EXAMINATION: ECOG PERFORMANCE STATUS: 1 - Symptomatic but completely ambulatory  Vitals:   01/31/18 0945  BP: 133/62  Pulse: 80  Resp: 18  Temp: 98.7 F (37.1 C)  SpO2: 96%   Filed Weights   01/31/18 0945  Weight: 228 lb 6.4 oz (103.6 kg)   . GENERAL:alert, in no acute distress and comfortable SKIN: no acute rashes, no significant lesions EYES: conjunctiva are pink and non-injected, sclera anicteric OROPHARYNX: MMM, no exudates, no oropharyngeal erythema or ulceration NECK: supple, no JVD LYMPH:  no palpable lymphadenopathy in the cervical, axillary or inguinal regions LUNGS: clear to auscultation b/l with normal respiratory effort HEART: regular rate & rhythm ABDOMEN:  normoactive bowel sounds , non tender, not distended. 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    LABS  . CBC    Component Value Date/Time   WBC 7.4 01/22/2018 0914   RBC 4.18 (L) 01/22/2018 0914   HGB 14.0 01/22/2018 0914   HCT 41.9 01/22/2018 0914   HCT 42.7 12/19/2017 1416   PLT 257 01/22/2018 0914   PLT 153 01/03/2018 0955   MCV 100.2 (H) 01/22/2018 0914   MCV 96.7 06/10/2017 1540   MCH 33.5 01/22/2018 0914   MCHC 33.4 01/22/2018 0914   RDW 13.7 01/22/2018 0914   LYMPHSABS 0.6 (L) 01/03/2018 0955   MONOABS 0.3 01/03/2018 0955   EOSABS 0.1 01/03/2018 0955   BASOSABS 0.0 01/03/2018 0955    CMP Latest Ref Rng & Units 01/22/2018 01/03/2018  12/19/2017  Glucose 65 - 99 mg/dL 110(H) 93 93  BUN 6 - 20 mg/dL 20 16 20   Creatinine 0.61 - 1.24 mg/dL 1.25(H) 1.24 1.39(H)  Sodium 135 - 145 mmol/L 137 140 142  Potassium 3.5 - 5.1 mmol/L 3.6 3.9 3.7  Chloride 101 - 111 mmol/L 98(L) 101 100  CO2 22 - 32 mmol/L 27 31(H) 32(H)  Calcium 8.9 - 10.3 mg/dL 8.8(L) 9.2 9.1  Total Protein 6.4 - 8.3 g/dL - 6.6 7.1  Total Bilirubin 0.2 - 1.2 mg/dL - 0.4 0.3  Alkaline Phos 40 - 150 U/L - 92 96  AST 5 - 34 U/L - 26 22  ALT 0 - 55 U/L - 28 33   Component     Latest Ref Rng & Units 12/19/2017  Iron     42 - 163 ug/dL 130  TIBC     202 - 409 ug/dL 331  Saturation Ratios     42 - 163 % 39 (L)  UIBC     ug/dL 201  Folate, Hemolysate     Not Estab. ng/mL 565.3  HCT     37.5 - 51.0 % 42.7  Folate, RBC     >498 ng/mL 1,324  Vitamin B12     180 - 914 pg/mL 264  Ferritin     22 - 316 ng/mL 42  Hepatitis B Surface Ag     Negative Negative  Hep B Core Ab, Tot     Negative Negative  HCV Ab     0.0 - 0.9 s/co ratio <0.1  LDH     125 - 245 U/L 287 (H)   LABORATORY DATA:    01/27/18 Flow Cytometry:   01/27/18 Parotid Gland Surgical Pathology   RADIOGRAPHIC STUDIES: I have personally reviewed the radiological images as listed and agreed  with the findings in the report. Dg Chest 2 View  Result Date: 01/08/2018 CLINICAL DATA:  Hemoptysis. EXAM: CHEST  2 VIEW COMPARISON:  01/01/2018 FINDINGS: The heart size and mediastinal contours are within normal limits. Both lungs are clear. The visualized skeletal structures are unremarkable. IMPRESSION: No active cardiopulmonary disease. Electronically Signed   By: Kerby Moors M.D.   On: 01/08/2018 11:02   Nm Pet Image Initial (pi) Skull Base To Thigh  Result Date: 01/01/2018 CLINICAL DATA:  Initial treatment strategy for marginal zone B-cell lymphoma in the left parotid gland with cervical lymphadenopathy. EXAM: NUCLEAR MEDICINE PET SKULL BASE TO THIGH TECHNIQUE: 10.9 mCi F-18 FDG was injected  intravenously. Full-ring PET imaging was performed from the skull base to thigh after the radiotracer. CT data was obtained and used for attenuation correction and anatomic localization. FASTING BLOOD GLUCOSE:  Value: 101 mg/dl COMPARISON:  CT neck 11/08/2017. FINDINGS: NECK There are multiple hypermetabolic and mildly enlarged left cervical lymph nodes. These include intraparotid lesions measuring up to 12 mm on image 13 (SUV max 14.3), a 12 mm level III node on image 36 (SUV max 8.6) and several level V nodes (SUV max 10.0). No hypermetabolic or enlarged right-sided lymph nodes.There are no lesions of the pharyngeal mucosal space. CHEST There are several hypermetabolic lymph nodes within the superior mediastinum and subcarinal regions bilaterally. 13 mm right infrahilar node on image 73 has an SUV max of 9.1. Left infrahilar node measuring 11 mm on image 71 has an SUV max 8.2. No axillary or hilar adenopathy. There is no suspicious pulmonary activity. Mild emphysematous changes are present in the lungs with perihilar atelectasis or scarring bilaterally. ABDOMEN/PELVIS There is no hypermetabolic activity within the liver, adrenal glands, spleen or pancreas. There is no hypermetabolic nodal activity within the abdomen or pelvis. Small left inguinal lymph nodes are not pathologically enlarged but demonstrate mildly prominent metabolic activity (SUV max 3.2). Similar to blood pool activity (SUV 3.4). Sigmoid diverticulosis and mild aortic and branch vessel atherosclerosis noted. SKELETON There is no hypermetabolic activity to suggest osseous metastatic disease. Postsurgical changes in the lumbar spine. Activity in the right antecubital fossa attributed to the injection. IMPRESSION: 1. Hypermetabolic lymphoma appears confined to left cervical and mediastinal lymph nodes. 2. Small left inguinal lymph nodes demonstrate nonspecific low-level activity, likely reactive. Splenic size and metabolic activity are normal. 3.  Incidental findings including colonic diverticulosis and mild Aortic Atherosclerosis (ICD10-I70.0). Electronically Signed   By: Richardean Sale M.D.   On: 01/01/2018 11:59    ASSESSMENT & PLAN:   72 y.o. is a  male with    1. Newly diagnosed Stage IIE Marginal-zone lymphoma involving the left parotid gland, left cervical LN and mediastinal lymph nodes. . Lab Results  Component Value Date   LDH 266 (H) 01/03/2018   Hep C neg and Hep B  2. Jehovas Witness- refusing any blood products for transfusion PLAN -Discussed 01/03/18 PET/CT involves mediastinal lymph nodes, cervical lymph nodes, and his parotid gland.  -Extranodal marginal zone lymphoma with Stage IIE based on 01/01/18 PET/CT and biopsy results. -Noted that his RA and immunosuppression from treatment could be risk factors for his lymphoma. -Discussed that his lymphoma is slow growing and Non-Hodgkin's B-Cell lymphoma. -Reviewed the national guidelines -NCCN for his treatment and care based on his Stage IIE.  -Discussed that we could either continue observation, or treat with Rituxan with or without Bendamustine. -Discussed the patient's goals of care with him and his family  -The pt  prefers to begin treatment with Rituxan at this time which would be the best approach. -EPO and other non-blood products would be considered if his RBC numbers drop, as opposed to PRBC since the pt notes he is opposed to blood transfusions given that he is a Jehovah's Witness.  -he is okay with Rituxan 375mg /m2 weekly x 4 doses. Low likelihood of needing transfusion support with this treatment. -After initial Rituxan treatment, we will discuss and evaluate the need for maintenance Rituxan. -We will set pt up for chemotherapy education class.  -Provided supplemental information about Rituxan to pt and his family.   2. Rheumatoid Arthritis -on off  MTX and prednisone per his rheumatologist. -may temporarily increase Prednisone to 20mg  po daily -Rituxan  might help with some control of his RA. Other treatments based on symptoms post-rituxan. -will try to taper prednisone after Rituxan treatment started.   3. Low B12 levels -continue B12 SL 1041mcg SL daily   Chemo-counseling for Rituxan Start Rituxan weekly x 4 doses within 5-7 days Weekly labs with Rituxan RTC with Dr Irene Limbo with C2 of Rituxan (1week after 1st dose) for toxicity check   Will need Prevnar and Pneumovax post rituxan  All of the patient's questions were answered with apparent satisfaction. The patient knows to call the clinic with any problems, questions or concerns.  . The total time spent in the appointment was 30 minutes and more than 50% was on counseling and direct patient cares.    Sullivan Lone MD [place] AAHIVMS [facility] Hematology/Oncology Physician [facility]  (Office):       [phone] (Work cell):  [phone] (Fax):           [phone]    This document serves as a record of services personally performed by Sullivan Lone, MD. It was created on his behalf by Baldwin Jamaica, a trained medical scribe. The creation of this record is based on the scribe's personal observations and the provider's statements to them.   .I have reviewed the above documentation for accuracy and completeness, and I agree with the above. .[place]

## 2018-01-31 ENCOUNTER — Inpatient Hospital Stay: Payer: Medicare HMO | Attending: Hematology | Admitting: Hematology

## 2018-01-31 ENCOUNTER — Telehealth: Payer: Self-pay | Admitting: Hematology

## 2018-01-31 ENCOUNTER — Encounter: Payer: Self-pay | Admitting: Hematology

## 2018-01-31 VITALS — BP 133/62 | HR 80 | Temp 98.7°F | Resp 18 | Wt 228.4 lb

## 2018-01-31 DIAGNOSIS — C8580 Other specified types of non-Hodgkin lymphoma, unspecified site: Secondary | ICD-10-CM

## 2018-01-31 DIAGNOSIS — E538 Deficiency of other specified B group vitamins: Secondary | ICD-10-CM

## 2018-01-31 DIAGNOSIS — C884 Extranodal marginal zone B-cell lymphoma of mucosa-associated lymphoid tissue [MALT-lymphoma]: Secondary | ICD-10-CM

## 2018-01-31 DIAGNOSIS — M069 Rheumatoid arthritis, unspecified: Secondary | ICD-10-CM | POA: Diagnosis not present

## 2018-01-31 DIAGNOSIS — Z5112 Encounter for antineoplastic immunotherapy: Secondary | ICD-10-CM | POA: Insufficient documentation

## 2018-01-31 NOTE — Telephone Encounter (Signed)
Gave patient avs report and appointments for March and April.  °

## 2018-01-31 NOTE — Patient Instructions (Signed)
Thank you for choosing [facility] to provide your oncology and hematology care.  To afford each patient quality time with our providers, please arrive 30 minutes before your scheduled appointment time.  If you arrive late for your appointment, you may be asked to reschedule.  We strive to give you quality time with our providers, and arriving late affects you and other patients whose appointments are after yours.   If you are a no show for multiple scheduled visits, you may be dismissed from the clinic at the providers discretion.    Again, thank you for choosing [facility], our hope is that these requests will decrease the amount of time that you wait before being seen by our physicians.  ______________________________________________________________________  Should you have questions after your visit to the [facility], please contact our office at [phone] between the hours of 8:30 and 4:30 p.m.    Voicemails left after 4:30p.m will not be returned until the following business day.    For prescription refill requests, please have your pharmacy contact us directly.  Please also try to allow 48 hours for prescription requests.    Please contact the scheduling department for questions regarding scheduling.  For scheduling of procedures such as PET scans, CT scans, MRI, Ultrasound, etc please contact central scheduling at [phone].    Resources For Cancer Patients and Caregivers:   Oncolink.org:  A wonderful resource for patients and healthcare providers for information regarding your disease, ways to tract your treatment, what to expect, etc.     American Cancer Society:  800-227-2345  Can help patients locate various types of support and financial assistance  Cancer Care: 1-800-813-HOPE (4673) Provides financial assistance, online support groups, medication/co-pay assistance.    [place] DSS:  336-641-3447 Where to apply for food  stamps, Medicaid, and utility assistance  Medicare Rights Center: 800-333-4114 Helps people with Medicare understand their rights and benefits, navigate the Medicare system, and secure the quality healthcare they deserve  SCAT: 336-333-6589 [employer]'s shared-ride transportation service for eligible riders who have a disability that prevents them from riding the fixed route bus.    For additional information on assistance programs please contact our social worker:   Grier Hock/Abigail Elmore:  [phone]            

## 2018-02-02 DIAGNOSIS — C8580 Other specified types of non-Hodgkin lymphoma, unspecified site: Secondary | ICD-10-CM | POA: Insufficient documentation

## 2018-02-02 NOTE — Progress Notes (Signed)
START ON PATHWAY REGIMEN - Lymphoma and CLL     Administer weekly:     Rituximab   **Always confirm dose/schedule in your pharmacy ordering system**    Patient Characteristics: Marginal Zone Lymphoma, Localized, Other Disease Type: Marginal Zone Lymphoma Disease Type: Not Applicable Disease Type: Not Applicable Localized or Systemic Disease<= Localized [place] Stage: II Localized Disease Type: Other Intent of Therapy: Non-Curative / Palliative Intent, Discussed with Patient

## 2018-02-05 ENCOUNTER — Inpatient Hospital Stay: Payer: Medicare HMO

## 2018-02-06 ENCOUNTER — Telehealth: Payer: Self-pay

## 2018-02-06 NOTE — Telephone Encounter (Signed)
Received call from Jesse Fall, RN post immunotherapy education with pt regarding rituxan. Call questioning pt need for any home medications. Rituxan treatment does not typically cause nausea, but RN just wondering. This RN spoke with Dr. Irene Limbo, no home medications to be ordered at this time unless pt verbalizes side effects.

## 2018-02-07 ENCOUNTER — Inpatient Hospital Stay: Payer: Medicare HMO

## 2018-02-07 VITALS — BP 163/78 | HR 78 | Temp 98.0°F | Resp 16

## 2018-02-07 DIAGNOSIS — C8580 Other specified types of non-Hodgkin lymphoma, unspecified site: Secondary | ICD-10-CM

## 2018-02-07 DIAGNOSIS — Z5112 Encounter for antineoplastic immunotherapy: Secondary | ICD-10-CM | POA: Diagnosis not present

## 2018-02-07 LAB — CBC WITH DIFFERENTIAL (CANCER CENTER ONLY)
BASOS ABS: 0 10*3/uL (ref 0.0–0.1)
BASOS PCT: 0 %
Eosinophils Absolute: 0.1 10*3/uL (ref 0.0–0.5)
Eosinophils Relative: 1 %
HEMATOCRIT: 41.7 % (ref 38.4–49.9)
Hemoglobin: 13.9 g/dL (ref 13.0–17.1)
Lymphocytes Relative: 12 %
Lymphs Abs: 0.8 10*3/uL — ABNORMAL LOW (ref 0.9–3.3)
MCH: 33.3 pg (ref 27.2–33.4)
MCHC: 33.3 g/dL (ref 32.0–36.0)
MCV: 99.8 fL — ABNORMAL HIGH (ref 79.3–98.0)
MONO ABS: 0.3 10*3/uL (ref 0.1–0.9)
Monocytes Relative: 4 %
NEUTROS ABS: 5.9 10*3/uL (ref 1.5–6.5)
Neutrophils Relative %: 83 %
PLATELETS: 238 10*3/uL (ref 140–400)
RBC: 4.18 MIL/uL — ABNORMAL LOW (ref 4.20–5.82)
RDW: 13.5 % (ref 11.0–14.6)
WBC Count: 7.1 10*3/uL (ref 4.0–10.3)

## 2018-02-07 LAB — LACTATE DEHYDROGENASE: LDH: 215 U/L (ref 125–245)

## 2018-02-07 LAB — RETICULOCYTES
RBC.: 4.18 MIL/uL — AB (ref 4.20–5.82)
RETIC COUNT ABSOLUTE: 54.3 10*3/uL (ref 34.8–93.9)
RETIC CT PCT: 1.3 % (ref 0.8–1.8)

## 2018-02-07 LAB — CMP (CANCER CENTER ONLY)
ALT: 26 U/L (ref 0–55)
AST: 19 U/L (ref 5–34)
Albumin: 3.3 g/dL — ABNORMAL LOW (ref 3.5–5.0)
Alkaline Phosphatase: 94 U/L (ref 40–150)
Anion gap: 10 (ref 3–11)
BILIRUBIN TOTAL: 0.3 mg/dL (ref 0.2–1.2)
BUN: 20 mg/dL (ref 7–26)
CALCIUM: 8.8 mg/dL (ref 8.4–10.4)
CO2: 28 mmol/L (ref 22–29)
CREATININE: 1.18 mg/dL (ref 0.70–1.30)
Chloride: 103 mmol/L (ref 98–109)
GFR, EST NON AFRICAN AMERICAN: 59 mL/min — AB (ref >60)
Glucose, Bld: 155 mg/dL — ABNORMAL HIGH (ref 70–140)
Potassium: 3.4 mmol/L — ABNORMAL LOW (ref 3.5–5.1)
Sodium: 141 mmol/L (ref 136–145)
TOTAL PROTEIN: 6.8 g/dL (ref 6.4–8.3)

## 2018-02-07 LAB — SEDIMENTATION RATE: Sed Rate: 11 mm/hr (ref 0–16)

## 2018-02-07 MED ORDER — FAMOTIDINE IN NACL 20-0.9 MG/50ML-% IV SOLN
20.0000 mg | Freq: Once | INTRAVENOUS | Status: AC
  Administered 2018-02-07: 20 mg via INTRAVENOUS

## 2018-02-07 MED ORDER — DIPHENHYDRAMINE HCL 25 MG PO CAPS
50.0000 mg | ORAL_CAPSULE | Freq: Once | ORAL | Status: AC
  Administered 2018-02-07: 50 mg via ORAL

## 2018-02-07 MED ORDER — RITUXIMAB CHEMO INJECTION 500 MG/50ML
375.0000 mg/m2 | Freq: Once | INTRAVENOUS | Status: AC
  Administered 2018-02-07: 800 mg via INTRAVENOUS
  Filled 2018-02-07: qty 30

## 2018-02-07 MED ORDER — ACETAMINOPHEN 325 MG PO TABS
ORAL_TABLET | ORAL | Status: AC
  Filled 2018-02-07: qty 2

## 2018-02-07 MED ORDER — ACETAMINOPHEN 325 MG PO TABS
650.0000 mg | ORAL_TABLET | Freq: Once | ORAL | Status: AC
  Administered 2018-02-07: 650 mg via ORAL

## 2018-02-07 MED ORDER — METHYLPREDNISOLONE SODIUM SUCC 125 MG IJ SOLR
125.0000 mg | Freq: Once | INTRAMUSCULAR | Status: AC
  Administered 2018-02-07: 125 mg via INTRAVENOUS

## 2018-02-07 MED ORDER — METHYLPREDNISOLONE SODIUM SUCC 125 MG IJ SOLR
INTRAMUSCULAR | Status: AC
  Filled 2018-02-07: qty 2

## 2018-02-07 MED ORDER — DIPHENHYDRAMINE HCL 25 MG PO CAPS
ORAL_CAPSULE | ORAL | Status: AC
  Filled 2018-02-07: qty 2

## 2018-02-07 MED ORDER — FAMOTIDINE IN NACL 20-0.9 MG/50ML-% IV SOLN
INTRAVENOUS | Status: AC
  Filled 2018-02-07: qty 50

## 2018-02-07 MED ORDER — SODIUM CHLORIDE 0.9 % IV SOLN
Freq: Once | INTRAVENOUS | Status: AC
  Administered 2018-02-07: 09:00:00 via INTRAVENOUS

## 2018-02-07 NOTE — Patient Instructions (Signed)
[facility] Discharge Instructions for Patients Receiving Chemotherapy  Today you received the following chemotherapy agents rituxan.   To help prevent nausea and vomiting after your treatment, we encourage you to take your nausea medication as directed.     If you develop nausea and vomiting that is not controlled by your nausea medication, call the clinic.   BELOW ARE SYMPTOMS THAT SHOULD BE REPORTED IMMEDIATELY:  *FEVER GREATER THAN 100.5 F  *CHILLS WITH OR WITHOUT FEVER  NAUSEA AND VOMITING THAT IS NOT CONTROLLED WITH YOUR NAUSEA MEDICATION  *UNUSUAL SHORTNESS OF BREATH  *UNUSUAL BRUISING OR BLEEDING  TENDERNESS IN MOUTH AND THROAT WITH OR WITHOUT PRESENCE OF ULCERS  *URINARY PROBLEMS  *BOWEL PROBLEMS  UNUSUAL RASH Items with * indicate a potential emergency and should be followed up as soon as possible.  Feel free to call the clinic you have any questions or concerns. The clinic phone number is [phone].  

## 2018-02-10 ENCOUNTER — Telehealth: Payer: Self-pay

## 2018-02-10 NOTE — Telephone Encounter (Signed)
Performed chemotherapy f/u call. Pt noted chronic back pain that is still causing him some trouble and asked if steroid would eliminate this pain. Explained to pt that it would be best to discuss with Dr. Irene Limbo on Friday the expected result of steroid helping his pain. Pt denied any symptoms post first-time rituxan on Friday.

## 2018-02-11 ENCOUNTER — Telehealth: Payer: Self-pay | Admitting: Hematology

## 2018-02-11 NOTE — Telephone Encounter (Signed)
FAXED RECORDS TO MEDICATION MANAGEMENT RELEASE ID [id number]

## 2018-02-13 NOTE — Progress Notes (Signed)
HEMATOLOGY ONCOLOGY CLINIC NOTE  DOS:  02/14/18   Patient Care Team: Shawnee Knapp, MD as PCP - General (Family Medicine) [name] as Physician Assistant (Emergency Medicine) [name], MD as Consulting Physician (Orthopedic Surgery)  Stuckey F/u for recently diagnosed Marginal Zone lymphoma and was toxicity check with Rituxan  HISTORY OF PRESENTING ILLNESS:   Bryan [name] 72 y.o. male is here because of a referral from ENT [name] regarding a concern of MALT lymphoma.   He is accompanied today by 4 members of his family. The pt reports that he is doing well overall. The pt's PCP is [name]. The pt sees [name] for his Rheumatology. The pt went off of humira a month before his mass appeared two months. The pt takes Methotrexate 15 mg once each week for his rheumatoid arthritis. He notes that he took humira for 8-10 months, and hasn't taken it in about 3 months. He began taking prednisone 15mg  q day, 2 months ago.   He reports having high blood pressure and takes amlodipine, metoprolol for it. He reports having had back and neck surgery for discs.  He also notes having essential tremors in his hands for which he takes primidone.  He reports having run out of flomax. He has had some PTSD from his time in the [place] war.  He notes he has had drenching night sweats for the last 15 years that are of intermittent frequency. He denies any other medical issues.    He first noticed swelling of his left cheek about two months ago. He notes that it was "hard like a rock." He was placed on prednisone for about a month after going to the doctor, with some relief. He then had his needle Bx recorded below.   He reports gum pain that began this morning, and also reports that his tongue feels as if it has bumps on it. He reports taking folic acid 1mg  each day.    On 10/29/17 the pt had a neck CT revealing Multifocal left parotid lesion. Multiple  ill-defined enhancing nodules in the left parotid gland. Asymmetric enhancing lymph nodes in the left neck are not pathologically enlarged however given the asymmetry and the left parotid lesion, they could represent neoplastic spread of parotid malignancy or lymphoma. Tissue sampling recommended.  Of note prior to the patient's visit, pt has had a biopsy of his left parotid gland completed on 12/03/17 with results revealing Atypical lymphoid proliferation. 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 -   B cell clonality study was positive for clonality.   On review of systems, pt reports gum pain, fatigue (not recent), occasional night sweats, ankle pain, and denies abdominal pains, back pain, flank pain, leg swelling, swollen or painful joints beside ankles.   On PMHx the pt has had rheumatoid arthritis for 6 years and HTN. He notes that he has anemia and is a Acupuncturist Witness I- which he notes means he would not want to consider blood products  On Surgical Hx the pt had back surgery. On Social Hx the pt quit smoking in 1976 after smoking about 8 cigarettes each day for 15 years. He notes drinking ETOH about once a week. He denies chemical or radiation exposure, except for agent orange.  On Family Hx the pt notes high blood pressure, DM, but denies autoimmune conditions,  cancers, or blood disorders.   INTERVAL HISTORY:  Bryan [name] is here for a scheduled follow-up of his marginal zone lymphoma and for a toxicity check of his Rituxan. The patient's last visit with [place] was on 01/31/18. He is accompanied today by his wife. The pt reports that he is doing quite well overall and is looking forward to going fishing soon.   The pt reports that he is tolerating the Rituxan quite well, outside of a mild headache on the second day of treatment.   He endorses new minimal  wheezing without any shortness of breath but denies seasonal allergies or having asthma.   Notes decrease in left parotid fullness.  Lab results (02/07/18) of CBC, CMP, and Reticulocytes is as follows: all values are WNL except for RBC at 4.18, MCV at 101.2, Neutro Abs at 7.3k, Lymphs Abs at 0.7k, Glucose at 177, Albumin at 3.4.  On review of systems, pt reports back pain, resolved headaches, wheezing, and denies abdominal pain, other painful joints, leg swelling and any other symptoms.    REVIEW OF SYSTEMS:    .10 Point review of Systems was done is negative except as noted above.  MEDICAL HISTORY:  Past Medical History:  Diagnosis Date  . Anemia   . Arthritis    hands, knees, cervical area. Back pain. Rheumatoid arthritis- weekly injections.  [name] BPH (benign prostatic hypertrophy)   . Essential tremor   . GERD (gastroesophageal reflux disease)    reports for indigestion he uses mustard   . Hearing deficit    wears hearing aids bilateral  . HTN (hypertension)   . Obesity   . Refusal of blood transfusions as patient is Jehovah's Witness   . Right bundle branch block    history of  . Seizures (HCC)    AS A CHILD. only esential tremors now.  . Sleep apnea    cpap - settings at 9 per patient   . Ulcer   JEHOVA's WITNESS  SURGICAL HISTORY: Past Surgical History:  Procedure Laterality Date  . ANTERIOR CERVICAL DECOMP/DISCECTOMY FUSION N/A 03/30/2013   Procedure: ANTERIOR CERVICAL DECOMPRESSION/DISCECTOMY FUSION 2 LEVELS;  Surgeon: Otilio Connors, MD;  Location: [facility];  Service: Neurosurgery;  Laterality: N/A;  C4-5 C5-6 Anterior cervical decompression/diskectomy/fusion/Allograft/Plate  . CHOLECYSTECTOMY N/A 12/07/2013   Procedure: LAPAROSCOPIC CHOLECYSTECTOMY WITH INTRAOPERATIVE CHOLANGIOGRAM;  Surgeon: Adin Hector, MD;  Location: [facility];  Service: General;  Laterality: N/A;  . COLONOSCOPY    . DECOMPRESSIVE LUMBAR LAMINECTOMY LEVEL 2 N/A 02/15/2015   Procedure: COMPLETE  DECOMPRESSIVE LUMBAR LAMINECTOMY L4-L5/ FORAMINOTOMY TO L4 NERVE ROOT AND L5 NERVE ROOT BILATERALLY;  Surgeon: [name], MD;  Location: [facility];  Service: Orthopedics;  Laterality: N/A;  . EYE SURGERY     right, growth excision  . LUMBAR LAMINECTOMY/DECOMPRESSION MICRODISCECTOMY Left 01/18/2016   Procedure:  DECOMPRESSION L4-L5 MICRODISCECTOMY L4-L5 ON LEFT FOR SPINAL STENOSIS;  Surgeon: [name], MD;  Location: [facility];  Service: Orthopedics;  Laterality: Left;  . PAROTIDECTOMY Left 01/24/2018   Procedure: INCISIONAL BIOPSY OF LEFT PAROTID    MASS;  Surgeon: Helayne Seminole, MD;  Location: [facility];  Service: ENT;  Laterality: Left;  . SPINE SURGERY    . TONSILLECTOMY    . VASECTOMY    . WRIST GANGLION EXCISION Left     SOCIAL HISTORY: Social History   Socioeconomic History  . Marital status: Married    Spouse name: Not on file  . Number of children: 2  . Years  of education: Not on file  . Highest education level: Not on file  Occupational History  . Occupation: bus Education administrator: RETIRED  Social Needs  . Financial resource strain: Not on file  . Food insecurity:    Worry: Not on file    Inability: Not on file  . Transportation needs:    Medical: Not on file    Non-medical: Not on file  Tobacco Use  . Smoking status: Former Smoker    Types: Cigarettes    Last attempt to quit: 12/14/1972    Years since quitting: 45.1  . Smokeless tobacco: Never Used  Substance and Sexual Activity  . Alcohol use: Yes    Comment: occasionally  . Drug use: No  . Sexual activity: Yes    Birth control/protection: None  Lifestyle  . Physical activity:    Days per week: Not on file    Minutes per session: Not on file  . Stress: Not on file  Relationships  . Social connections:    Talks on phone: Not on file    Gets together: Not on file    Attends religious service: Not on file    Active member of club or organization: Not on file    Attends meetings of clubs or  organizations: Not on file    Relationship status: Not on file  . Intimate partner violence:    Fear of current or ex partner: Not on file    Emotionally abused: Not on file    Physically abused: Not on file    Forced sexual activity: Not on file  Other Topics Concern  . Not on file  Social History Narrative  . Not on file    FAMILY HISTORY: Family History  Problem Relation Age of Onset  . Colon cancer Maternal Grandfather   . Kidney disease Brother   . Stroke Maternal Grandmother   . Diabetes Paternal Aunt        x 4 aunts    ALLERGIES:  is allergic to lisinopril and other.  MEDICATIONS:  Current Outpatient Medications  Medication Sig Dispense Refill  . aspirin EC 81 MG tablet Take 81 mg by mouth daily.    [name] gabapentin (NEURONTIN) 300 MG capsule TAKE 1 CAPSULE BY MOUTH THREE TIMES DAILY 90 capsule 0  . hydrochlorothiazide (HYDRODIURIL) 25 MG tablet TAKE 1 TABLET BY MOUTH ONCE DAILY (Patient taking differently: TAKE 25 MG BY MOUTH ONCE DAILY) 30 tablet 6  . metoprolol succinate (TOPROL-XL) 50 MG 24 hr tablet TAKE 1 TABLET BY MOUTH ONCE DAILY. TAKE WITH OR IMMEDIATELY FOLLOWING A MEAL (Patient taking differently: TAKE 50 MG BY MOUTH ONCE DAILY. TAKE WITH OR IMMEDIATELY FOLLOWING A MEAL) 90 tablet 3  . primidone (MYSOLINE) 50 MG tablet Take 50 mg by mouth 2 (two) times daily.     . ranitidine (ZANTAC) 150 MG tablet Take 1 tablet (150 mg total) by mouth 2 (two) times daily. (Patient taking differently: Take 150 mg by mouth daily. ) 60 tablet 0  . Tamsulosin HCl (FLOMAX) 0.4 MG CAPS Take 0.4 mg by mouth at bedtime.     . traMADol (ULTRAM) 50 MG tablet Take 1 tablet (50 mg total) every 8 (eight) hours as needed by mouth for severe pain. 30 tablet 0   No current facility-administered medications for this visit.     PHYSICAL EXAMINATION: ECOG PERFORMANCE STATUS: 1 - Symptomatic but completely ambulatory  Vitals:   02/14/18 1258  BP: (!) 165/69  Pulse: 86  Resp: 18  Temp: 98.2  F (36.8 C)  SpO2: 100%   Filed Weights   02/14/18 1258  Weight: 232 lb 4.8 oz (105.4 kg)   . GENERAL:alert, in no acute distress and comfortable SKIN: no acute rashes, no significant lesions EYES: conjunctiva are pink and non-injected, sclera anicteric OROPHARYNX: MMM, no exudates, no oropharyngeal erythema or ulceration NECK: supple, no JVD LYMPH:  no palpable lymphadenopathy in the cervical, axillary or inguinal regions LUNGS: some scattered inspiratory and expiratory wheezing HEART: regular rate & rhythm ABDOMEN:  normoactive bowel sounds , non tender, not distended. Extremity: no pedal edema PSYCH: alert & oriented x 3 with fluent speech NEURO: no focal motor/sensory deficits  LABS  . CBC Latest Ref Rng & Units 02/14/2018 02/07/2018 01/22/2018  WBC 4.0 - 10.3 K/uL 8.4 7.1 7.4  Hemoglobin 13.0 - 17.0 g/dL - - 14.0  Hematocrit 38.4 - 49.9 % 42.3 41.7 41.9  Platelets 140 - 400 K/uL 187 238 257   HGB 13.8  CBC    Component Value Date/Time   WBC 7.1 02/07/2018 0741   WBC 7.4 01/22/2018 0914   RBC 4.18 (L) 02/07/2018 0741   RBC 4.18 (L) 02/07/2018 0741   HGB 14.0 01/22/2018 0914   HCT 41.7 02/07/2018 0741   HCT 42.7 12/19/2017 1416   PLT 238 02/07/2018 0741   MCV 99.8 (H) 02/07/2018 0741   MCV 96.7 06/10/2017 1540   MCH 33.3 02/07/2018 0741   MCHC 33.3 02/07/2018 0741   RDW 13.5 02/07/2018 0741   LYMPHSABS 0.8 (L) 02/07/2018 0741   MONOABS 0.3 02/07/2018 0741   EOSABS 0.1 02/07/2018 0741   BASOSABS 0.0 02/07/2018 0741    CMP Latest Ref Rng & Units 02/14/2018 02/07/2018 01/22/2018  Glucose 70 - 140 mg/dL 177(H) 155(H) 110(H)  BUN 7 - 26 mg/dL 20 20 20   Creatinine 0.70 - 1.30 mg/dL 1.26 1.18 1.25(H)  Sodium 136 - 145 mmol/L 140 141 137  Potassium 3.5 - 5.1 mmol/L 3.9 3.4(L) 3.6  Chloride 98 - 109 mmol/L 103 103 98(L)  CO2 22 - 29 mmol/L 28 28 27   Calcium 8.4 - 10.4 mg/dL 8.9 8.8 8.8(L)  Total Protein 6.4 - 8.3 g/dL 6.8 6.8 -  Total Bilirubin 0.2 - 1.2 mg/dL 0.4  0.3 -  Alkaline Phos 40 - 150 U/L 96 94 -  AST 5 - 34 U/L 17 19 -  ALT 0 - 55 U/L 21 26 -   . Lab Results  Component Value Date   LDH 215 02/07/2018    Component     Latest Ref Rng & Units 12/19/2017  Iron     42 - 163 ug/dL 130  TIBC     202 - 409 ug/dL 331  Saturation Ratios     42 - 163 % 39 (L)  UIBC     ug/dL 201  Folate, Hemolysate     Not Estab. ng/mL 565.3  HCT     37.5 - 51.0 % 42.7  Folate, RBC     >498 ng/mL 1,324  Vitamin B12     180 - 914 pg/mL 264  Ferritin     22 - 316 ng/mL 42  Hepatitis B Surface Ag     Negative Negative  Hep B Core Ab, Tot     Negative Negative  HCV Ab     0.0 - 0.9 s/co ratio <0.1  LDH     125 - 245 U/L 287 (H)   LABORATORY DATA:    01/27/18  Flow Cytometry:   01/27/18 Parotid Gland Surgical Pathology   RADIOGRAPHIC STUDIES: I have personally reviewed the radiological images as listed and agreed with the findings in the report. No results found.  ASSESSMENT & PLAN:   72 y.o. is a  male with    1. Newly diagnosed Stage IIE Marginal-zone lymphoma involving the left parotid gland, left cervical LN and mediastinal lymph nodes. . Lab Results  Component Value Date   LDH 215 02/07/2018   Hep C neg and Hep B  2. Jehovas Witness- refusing any blood products for transfusion PLAN Discussed pt labwork today 02/14/18; blood counts and chemistries are stable.  -Pt has no prohibitive toxicities from Rituxan at this time -will add albuterol neb to premeds given mild wheezing and hold off on rapid Rituxan protocol as a result. -We will see pt back in one week for treatment tolerance. -EPO and other non-blood products would be considered if his RBC numbers drop, as opposed to PRBC since the pt notes he is opposed to blood transfusions given that he is a Jehovah's Witness.   2. Rheumatoid Arthritis - off  MTX and is currently on prednisone per his rheumatologist. -may temporarily increase Prednisone to 20mg  po daily -Rituxan might  help with some control of his RA. Other treatments based on symptoms post-rituxan. -will try to taper prednisone after Rituxan treatment started.   3. Low B12 levels -continue B12 SL 1057mcg SL daily  4. Wheezing -We will order lung function testing due to the patient's new wheezing and order a Nebulizer today as well.  -Will also add Albuterol to patient's pre-meds prior to rituxan administration   -continue Rituxan weekly to complete C3 and C4 . -RTC with Dr Irene Limbo in 1 week 4/12 with with 3rd dose of rituxan -RTC with Dr Irene Limbo in 4weeks with labs -PFTs-pulmonary function testing in 3-4 days  Will need Prevnar and Pneumovax post rituxan  All of the patient's questions were answered with apparent satisfaction. The patient knows to call the clinic with any problems, questions or concerns.  . The total time spent in the appointment was 20 minutes and more than 50% was on counseling and direct patient cares.    Sullivan Lone MD Boyd AAHIVMS [facility] Hematology/Oncology Physician [facility]  (Office):       [phone] (Work cell):  [phone] (Fax):           [phone]    This document serves as a record of services personally performed by Sullivan Lone, MD. It was created on his behalf by Baldwin Jamaica, a trained medical scribe. The creation of this record is based on the scribe's personal observations and the provider's statements to them.   .I have reviewed the above documentation for accuracy and completeness, and I agree with the above. [name] MD MS

## 2018-02-14 ENCOUNTER — Inpatient Hospital Stay: Payer: Medicare HMO

## 2018-02-14 ENCOUNTER — Telehealth: Payer: Self-pay | Admitting: Hematology

## 2018-02-14 ENCOUNTER — Inpatient Hospital Stay: Payer: Medicare HMO | Attending: Hematology | Admitting: Hematology

## 2018-02-14 ENCOUNTER — Ambulatory Visit: Payer: Medicare HMO | Admitting: Medical

## 2018-02-14 VITALS — BP 165/84 | HR 64 | Temp 98.6°F | Resp 18

## 2018-02-14 VITALS — BP 165/69 | HR 86 | Temp 98.2°F | Resp 18 | Ht 66.0 in | Wt 232.3 lb

## 2018-02-14 DIAGNOSIS — D649 Anemia, unspecified: Secondary | ICD-10-CM | POA: Diagnosis not present

## 2018-02-14 DIAGNOSIS — C884 Extranodal marginal zone B-cell lymphoma of mucosa-associated lymphoid tissue [MALT-lymphoma]: Secondary | ICD-10-CM | POA: Diagnosis not present

## 2018-02-14 DIAGNOSIS — C8580 Other specified types of non-Hodgkin lymphoma, unspecified site: Secondary | ICD-10-CM

## 2018-02-14 DIAGNOSIS — E538 Deficiency of other specified B group vitamins: Secondary | ICD-10-CM

## 2018-02-14 DIAGNOSIS — I10 Essential (primary) hypertension: Secondary | ICD-10-CM | POA: Insufficient documentation

## 2018-02-14 DIAGNOSIS — M069 Rheumatoid arthritis, unspecified: Secondary | ICD-10-CM | POA: Insufficient documentation

## 2018-02-14 DIAGNOSIS — Z5112 Encounter for antineoplastic immunotherapy: Secondary | ICD-10-CM | POA: Diagnosis present

## 2018-02-14 DIAGNOSIS — Z87891 Personal history of nicotine dependence: Secondary | ICD-10-CM | POA: Insufficient documentation

## 2018-02-14 DIAGNOSIS — R062 Wheezing: Secondary | ICD-10-CM | POA: Insufficient documentation

## 2018-02-14 LAB — CMP (CANCER CENTER ONLY)
ALT: 21 U/L (ref 0–55)
AST: 17 U/L (ref 5–34)
Albumin: 3.4 g/dL — ABNORMAL LOW (ref 3.5–5.0)
Alkaline Phosphatase: 96 U/L (ref 40–150)
Anion gap: 9 (ref 3–11)
BILIRUBIN TOTAL: 0.4 mg/dL (ref 0.2–1.2)
BUN: 20 mg/dL (ref 7–26)
CHLORIDE: 103 mmol/L (ref 98–109)
CO2: 28 mmol/L (ref 22–29)
CREATININE: 1.26 mg/dL (ref 0.70–1.30)
Calcium: 8.9 mg/dL (ref 8.4–10.4)
GFR, EST NON AFRICAN AMERICAN: 55 mL/min — AB (ref >60)
Glucose, Bld: 177 mg/dL — ABNORMAL HIGH (ref 70–140)
POTASSIUM: 3.9 mmol/L (ref 3.5–5.1)
Sodium: 140 mmol/L (ref 136–145)
TOTAL PROTEIN: 6.8 g/dL (ref 6.4–8.3)

## 2018-02-14 LAB — CBC WITH DIFFERENTIAL (CANCER CENTER ONLY)
BASOS ABS: 0 10*3/uL (ref 0.0–0.1)
Basophils Relative: 0 %
EOS ABS: 0.1 10*3/uL (ref 0.0–0.5)
EOS PCT: 1 %
HCT: 42.3 % (ref 38.4–49.9)
HEMOGLOBIN: 13.8 g/dL (ref 13.0–17.1)
LYMPHS PCT: 8 %
Lymphs Abs: 0.7 10*3/uL — ABNORMAL LOW (ref 0.9–3.3)
MCH: 33 pg (ref 27.2–33.4)
MCHC: 32.6 g/dL (ref 32.0–36.0)
MCV: 101.2 fL — ABNORMAL HIGH (ref 79.3–98.0)
Monocytes Absolute: 0.3 10*3/uL (ref 0.1–0.9)
Monocytes Relative: 3 %
NEUTROS PCT: 88 %
Neutro Abs: 7.3 10*3/uL — ABNORMAL HIGH (ref 1.5–6.5)
PLATELETS: 187 10*3/uL (ref 140–400)
RBC: 4.18 MIL/uL — AB (ref 4.20–5.82)
RDW: 13.3 % (ref 11.0–14.6)
WBC: 8.4 10*3/uL (ref 4.0–10.3)

## 2018-02-14 LAB — RETICULOCYTES
RBC.: 4.18 MIL/uL — ABNORMAL LOW (ref 4.20–5.82)
Retic Count, Absolute: 46 10*3/uL (ref 34.8–93.9)
Retic Ct Pct: 1.1 % (ref 0.8–1.8)

## 2018-02-14 MED ORDER — CLONIDINE HCL 0.1 MG PO TABS
0.2000 mg | ORAL_TABLET | Freq: Once | ORAL | Status: AC
  Administered 2018-02-14: 0.2 mg via ORAL

## 2018-02-14 MED ORDER — DIPHENHYDRAMINE HCL 25 MG PO CAPS
50.0000 mg | ORAL_CAPSULE | Freq: Once | ORAL | Status: AC
  Administered 2018-02-14: 50 mg via ORAL

## 2018-02-14 MED ORDER — ALBUTEROL SULFATE (2.5 MG/3ML) 0.083% IN NEBU
2.5000 mg | INHALATION_SOLUTION | Freq: Once | RESPIRATORY_TRACT | Status: AC
  Administered 2018-02-14: 2.5 mg via RESPIRATORY_TRACT
  Filled 2018-02-14: qty 3

## 2018-02-14 MED ORDER — ALBUTEROL SULFATE (2.5 MG/3ML) 0.083% IN NEBU
INHALATION_SOLUTION | RESPIRATORY_TRACT | Status: AC
Start: 2018-02-14 — End: ?
  Filled 2018-02-14: qty 3

## 2018-02-14 MED ORDER — ACETAMINOPHEN 325 MG PO TABS
650.0000 mg | ORAL_TABLET | Freq: Once | ORAL | Status: AC
  Administered 2018-02-14: 650 mg via ORAL

## 2018-02-14 MED ORDER — DIPHENHYDRAMINE HCL 25 MG PO CAPS
ORAL_CAPSULE | ORAL | Status: AC
  Filled 2018-02-14: qty 2

## 2018-02-14 MED ORDER — DIPHENHYDRAMINE HCL 50 MG/ML IJ SOLN
25.0000 mg | Freq: Once | INTRAMUSCULAR | Status: AC | PRN
  Administered 2018-02-14: 25 mg via INTRAVENOUS

## 2018-02-14 MED ORDER — METHYLPREDNISOLONE SODIUM SUCC 125 MG IJ SOLR
INTRAMUSCULAR | Status: AC
  Filled 2018-02-14: qty 2

## 2018-02-14 MED ORDER — METHYLPREDNISOLONE SODIUM SUCC 125 MG IJ SOLR
125.0000 mg | Freq: Once | INTRAMUSCULAR | Status: AC
  Administered 2018-02-14: 125 mg via INTRAVENOUS

## 2018-02-14 MED ORDER — DIPHENHYDRAMINE HCL 50 MG/ML IJ SOLN
25.0000 mg | Freq: Once | INTRAMUSCULAR | Status: AC
  Administered 2018-02-14: 25 mg via INTRAVENOUS

## 2018-02-14 MED ORDER — CLONIDINE HCL 0.1 MG PO TABS
ORAL_TABLET | ORAL | Status: AC
Start: 2018-02-14 — End: ?
  Filled 2018-02-14: qty 2

## 2018-02-14 MED ORDER — FAMOTIDINE IN NACL 20-0.9 MG/50ML-% IV SOLN
20.0000 mg | Freq: Once | INTRAVENOUS | Status: AC
  Administered 2018-02-14: 20 mg via INTRAVENOUS

## 2018-02-14 MED ORDER — RITUXIMAB CHEMO INJECTION 500 MG/50ML
375.0000 mg/m2 | Freq: Once | INTRAVENOUS | Status: AC
  Administered 2018-02-14: 800 mg via INTRAVENOUS
  Filled 2018-02-14: qty 50

## 2018-02-14 MED ORDER — FAMOTIDINE IN NACL 20-0.9 MG/50ML-% IV SOLN
20.0000 mg | Freq: Once | INTRAVENOUS | Status: AC | PRN
Start: 2018-02-14 — End: 2018-02-14
  Administered 2018-02-14: 20 mg via INTRAVENOUS

## 2018-02-14 MED ORDER — ACETAMINOPHEN 325 MG PO TABS
ORAL_TABLET | ORAL | Status: AC
  Filled 2018-02-14: qty 2

## 2018-02-14 MED ORDER — FAMOTIDINE IN NACL 20-0.9 MG/50ML-% IV SOLN
INTRAVENOUS | Status: AC
  Filled 2018-02-14: qty 50

## 2018-02-14 MED ORDER — SODIUM CHLORIDE 0.9 % IV SOLN
Freq: Once | INTRAVENOUS | Status: AC
  Administered 2018-02-14: 14:00:00 via INTRAVENOUS

## 2018-02-14 NOTE — Progress Notes (Signed)
No Rapid Rituxan infusion for patient. He is wheezing which could be related to allergies or other factors per Dr. Irene Limbo.  Bryan [name], PharmD, BCPS, BCOP

## 2018-02-14 NOTE — Telephone Encounter (Signed)
Gave patient avs report and appointments for April and May. Left message for cardio pulmonary dept re setting up PFT - order in [place] will contact patient - patient aware.

## 2018-02-14 NOTE — Progress Notes (Signed)
1556-Rituxan infusion started and pt BP 154/76  1626- pt due for second Rituxan increase (200 mg) and pt BP 198/92 asymptomatic.  Rituxan paused and NS wide open.  Sandi Mealy, Pa notified and orders received to administer 25 mg IV benadryl to pt.    1642- pt BP 212/98 and asymptomatic.  Orders received from Sandi Mealy, PA to administer 0.2 P.O clonidine.    1700- pt BP 218/94, Orders received from Thomas to administer another 25 mg IV benadryl.   1707- Orders received from Sandi Mealy to administer 20 mg IV Pepcid   1720- BP 179/93   1723- Orders received from Sandi Mealy, PA to restart Rituxan infusion at 100 mg/hr (41 mL/Hr for 21 mLs).

## 2018-02-14 NOTE — Patient Instructions (Signed)
[facility] Discharge Instructions for Patients Receiving Chemotherapy  Today you received the following chemotherapy agents:  Rituxan.  To help prevent nausea and vomiting after your treatment, we encourage you to take your nausea medication as directed.   If you develop nausea and vomiting that is not controlled by your nausea medication, call the clinic.   BELOW ARE SYMPTOMS THAT SHOULD BE REPORTED IMMEDIATELY:  *FEVER GREATER THAN 100.5 F  *CHILLS WITH OR WITHOUT FEVER  NAUSEA AND VOMITING THAT IS NOT CONTROLLED WITH YOUR NAUSEA MEDICATION  *UNUSUAL SHORTNESS OF BREATH  *UNUSUAL BRUISING OR BLEEDING  TENDERNESS IN MOUTH AND THROAT WITH OR WITHOUT PRESENCE OF ULCERS  *URINARY PROBLEMS  *BOWEL PROBLEMS  UNUSUAL RASH Items with * indicate a potential emergency and should be followed up as soon as possible.  Feel free to call the clinic should you have any questions or concerns. The clinic phone number is [phone].  Please show the CHEMO ALERT CARD at check-in to the Emergency Department and triage nurse.   

## 2018-02-17 NOTE — Progress Notes (Signed)
DATE: 02/14/2018      _X_ CHEMO/IMMUNOTHERAPY REACTION        ___ TRANSFUSION REACTION   MD: ___  Burr Medico ___  Alvy Bimler ___  Lindi Adie _X_  Irene Limbo  ___  Magrinat ___  Julien Nordmann  ___  Lebron Conners ___ Alen Blew  ___ Sherrill ___  Vaslow   AGENT/BLOOD PRODUCT RECEIVED AT THE TIME OF REACTION:  Rituximab   AGENT/BLOOD PRODUCT RECEIVED PRIOR TO REACTION:  Rituximab  REACTION(S):  ___  Chest Pain (Pain Scale ___/10, Location _______________ , Radiation: Yes / No, SOB Yes / No,  Diaphoresis: Yes / No)  ___  Chills  ___  Diaphoresis    ___  Diarrhea   ___  Fever  ___  Hot Flash ___  MS Changes  ___   Nausea  ___  Pain (Location: ___________________ )   ___  Rash   ___  Rigors  ___  Visual Disturbance (Description: __________________ )    ___  Vomiting  ___   Slurred Speech   _X_  Other: Elevated blood pressure to 198/92  PREMEDS:   ___  Aloxi    ___  APAP    (___ mg)     ___  Ativan    (___ mg)   _X_  Benadryl   (50_ mg, ___ IV _X_ PO) ___  Compazine   (___ mg)    ___  Dexamethasone  (____ mg, ___ IV ___ PO)    ___  Emend ___  Zofran    (___ mg)  ___  Pepcid   ( 20 mg )    ___  Prednisone   (___ mg)    ___  No Premeds  Other: _____________________________________________________________________________       INTERVENTION:   ___   Albuterol Nebulizer   (2.5 mg/3 ml)   x  ____   Time: ______  ___   Ativan     (___ mg)  x  ____    Time: ______     Time: ______ _X_   Benadryl    (_25 mg, _X_ IV ___ PO)  x  __2_  Time: ______     Time: ______ ___   Demerol    (____ mg) x ____    Time: ______    Time:  ______ _X_   Dexamethasone   (125_ mg, _X_ IV ___ PO)  x  ____  Time: ______     Time: ______ ___   Epinephrine    0.5 mg [place] x 1     Time: ______ ___   Epinephrine    0.25 mg of 1 mg/10 ML x 1   Time: ______ ___  Morphine Sulfate   (____ mg)  x  ____    Time: ______     Time: ______ _X_   Pepcid    20 mg)  x  _X__    Time: ______     Time: ______ ___   Solumedrol    (____ mg)  x   ____    Time: ______     Time: ______ ___   Supplemental Oxygen  _X_ Tylenol    (650_ mg)   The patient was also given clonidine 0.2 mg x 1  ___   RAPID RESPONSE CALLED ___   CODE BLUE CALLED  OUTCOME: _X_   Patient responded to intervention  _X_ Chemo/Immunotherapy restarted and completed  ___ Chemotherapy ____________________ terminated  ___  Patient transported to ER for management  _X_  Other: The patient's  blood pressure normalized and returned to 150/76 at last check.     ________________________________________________ Sandi Mealy, MHS, PA-C

## 2018-02-19 ENCOUNTER — Encounter: Payer: Self-pay | Admitting: Physician Assistant

## 2018-02-21 ENCOUNTER — Telehealth: Payer: Self-pay | Admitting: Hematology

## 2018-02-21 ENCOUNTER — Inpatient Hospital Stay: Payer: Medicare HMO

## 2018-02-21 ENCOUNTER — Inpatient Hospital Stay (HOSPITAL_BASED_OUTPATIENT_CLINIC_OR_DEPARTMENT_OTHER): Payer: Medicare HMO | Admitting: Hematology

## 2018-02-21 ENCOUNTER — Other Ambulatory Visit: Payer: Medicare HMO

## 2018-02-21 ENCOUNTER — Inpatient Hospital Stay: Payer: Medicare HMO | Admitting: Medical

## 2018-02-21 ENCOUNTER — Ambulatory Visit: Payer: Self-pay | Admitting: Medical

## 2018-02-21 ENCOUNTER — Other Ambulatory Visit: Payer: Self-pay | Admitting: Medical

## 2018-02-21 ENCOUNTER — Encounter: Payer: Self-pay | Admitting: Hematology

## 2018-02-21 VITALS — BP 161/68 | HR 66 | Temp 98.4°F | Resp 18

## 2018-02-21 VITALS — BP 162/79 | HR 80 | Temp 98.6°F | Resp 18 | Ht 66.0 in | Wt 239.4 lb

## 2018-02-21 DIAGNOSIS — E538 Deficiency of other specified B group vitamins: Secondary | ICD-10-CM

## 2018-02-21 DIAGNOSIS — R69 Illness, unspecified: Secondary | ICD-10-CM | POA: Diagnosis not present

## 2018-02-21 DIAGNOSIS — Z79899 Other long term (current) drug therapy: Secondary | ICD-10-CM

## 2018-02-21 DIAGNOSIS — C8580 Other specified types of non-Hodgkin lymphoma, unspecified site: Secondary | ICD-10-CM

## 2018-02-21 DIAGNOSIS — R062 Wheezing: Secondary | ICD-10-CM

## 2018-02-21 DIAGNOSIS — C884 Extranodal marginal zone B-cell lymphoma of mucosa-associated lymphoid tissue [MALT-lymphoma]: Secondary | ICD-10-CM

## 2018-02-21 DIAGNOSIS — I10 Essential (primary) hypertension: Secondary | ICD-10-CM

## 2018-02-21 DIAGNOSIS — F4310 Post-traumatic stress disorder, unspecified: Secondary | ICD-10-CM | POA: Diagnosis not present

## 2018-02-21 DIAGNOSIS — M069 Rheumatoid arthritis, unspecified: Secondary | ICD-10-CM

## 2018-02-21 DIAGNOSIS — Z5112 Encounter for antineoplastic immunotherapy: Secondary | ICD-10-CM | POA: Diagnosis not present

## 2018-02-21 LAB — CBC WITH DIFFERENTIAL/PLATELET
Basophils Absolute: 0 10*3/uL (ref 0.0–0.1)
Basophils Relative: 0 %
EOS ABS: 0.1 10*3/uL (ref 0.0–0.5)
EOS PCT: 1 %
HCT: 39.9 % (ref 38.4–49.9)
HEMOGLOBIN: 12.8 g/dL — AB (ref 13.0–17.1)
LYMPHS ABS: 1 10*3/uL (ref 0.9–3.3)
Lymphocytes Relative: 13 %
MCH: 32.9 pg (ref 27.2–33.4)
MCHC: 32.1 g/dL (ref 32.0–36.0)
MCV: 102.6 fL — ABNORMAL HIGH (ref 79.3–98.0)
MONOS PCT: 3 %
Monocytes Absolute: 0.2 10*3/uL (ref 0.1–0.9)
NEUTROS PCT: 83 %
Neutro Abs: 6.1 10*3/uL (ref 1.5–6.5)
Platelets: 168 10*3/uL (ref 140–400)
RBC: 3.89 MIL/uL — ABNORMAL LOW (ref 4.20–5.82)
RDW: 13.7 % (ref 11.0–14.6)
WBC: 7.4 10*3/uL (ref 4.0–10.3)

## 2018-02-21 LAB — CMP (CANCER CENTER ONLY)
ALK PHOS: 98 U/L (ref 40–150)
ALT: 27 U/L (ref 0–55)
AST: 17 U/L (ref 5–34)
Albumin: 3.2 g/dL — ABNORMAL LOW (ref 3.5–5.0)
Anion gap: 9 (ref 3–11)
BILIRUBIN TOTAL: 0.3 mg/dL (ref 0.2–1.2)
BUN: 17 mg/dL (ref 7–26)
CALCIUM: 8.7 mg/dL (ref 8.4–10.4)
CO2: 28 mmol/L (ref 22–29)
CREATININE: 1.01 mg/dL (ref 0.70–1.30)
Chloride: 105 mmol/L (ref 98–109)
GFR, Est AFR Am: >60 mL/min (ref >60)
Glucose, Bld: 163 mg/dL — ABNORMAL HIGH (ref 70–140)
Potassium: 3.8 mmol/L (ref 3.5–5.1)
Sodium: 142 mmol/L (ref 136–145)
TOTAL PROTEIN: 6.5 g/dL (ref 6.4–8.3)

## 2018-02-21 LAB — RETICULOCYTES
RBC.: 3.89 MIL/uL — ABNORMAL LOW (ref 4.20–5.82)
RETIC CT PCT: 1.4 % (ref 0.8–1.8)
Retic Count, Absolute: 54.5 10*3/uL (ref 34.8–93.9)

## 2018-02-21 MED ORDER — CLONIDINE HCL 0.1 MG PO TABS
0.1000 mg | ORAL_TABLET | Freq: Once | ORAL | Status: AC
  Administered 2018-02-21: 0.1 mg via ORAL

## 2018-02-21 MED ORDER — DIPHENHYDRAMINE HCL 25 MG PO CAPS
ORAL_CAPSULE | ORAL | Status: AC
  Filled 2018-02-21: qty 2

## 2018-02-21 MED ORDER — FAMOTIDINE IN NACL 20-0.9 MG/50ML-% IV SOLN
20.0000 mg | Freq: Once | INTRAVENOUS | Status: AC
  Administered 2018-02-21: 20 mg via INTRAVENOUS

## 2018-02-21 MED ORDER — DIPHENHYDRAMINE HCL 25 MG PO CAPS
50.0000 mg | ORAL_CAPSULE | Freq: Once | ORAL | Status: AC
  Administered 2018-02-21: 50 mg via ORAL

## 2018-02-21 MED ORDER — SODIUM CHLORIDE 0.9 % IV SOLN
Freq: Once | INTRAVENOUS | Status: AC
  Administered 2018-02-21: 12:00:00 via INTRAVENOUS

## 2018-02-21 MED ORDER — DIPHENHYDRAMINE HCL 50 MG/ML IJ SOLN
25.0000 mg | Freq: Once | INTRAMUSCULAR | Status: AC | PRN
  Administered 2018-02-21: 25 mg via INTRAVENOUS

## 2018-02-21 MED ORDER — ALBUTEROL SULFATE (2.5 MG/3ML) 0.083% IN NEBU
2.5000 mg | INHALATION_SOLUTION | Freq: Once | RESPIRATORY_TRACT | Status: DC
  Filled 2018-02-21: qty 3

## 2018-02-21 MED ORDER — SODIUM CHLORIDE 0.9 % IV SOLN
375.0000 mg/m2 | Freq: Once | INTRAVENOUS | Status: AC
  Administered 2018-02-21: 800 mg via INTRAVENOUS
  Filled 2018-02-21: qty 30

## 2018-02-21 MED ORDER — CLONIDINE HCL 0.1 MG PO TABS
ORAL_TABLET | ORAL | Status: AC
  Filled 2018-02-21: qty 2

## 2018-02-21 MED ORDER — METHYLPREDNISOLONE SODIUM SUCC 125 MG IJ SOLR
INTRAMUSCULAR | Status: AC
  Filled 2018-02-21: qty 2

## 2018-02-21 MED ORDER — ALBUTEROL SULFATE (2.5 MG/3ML) 0.083% IN NEBU
INHALATION_SOLUTION | RESPIRATORY_TRACT | Status: AC
  Filled 2018-02-21: qty 3

## 2018-02-21 MED ORDER — FAMOTIDINE IN NACL 20-0.9 MG/50ML-% IV SOLN
INTRAVENOUS | Status: AC
  Filled 2018-02-21: qty 50

## 2018-02-21 MED ORDER — ACETAMINOPHEN 325 MG PO TABS
ORAL_TABLET | ORAL | Status: AC
  Filled 2018-02-21: qty 2

## 2018-02-21 MED ORDER — CLONIDINE HCL 0.1 MG PO TABS
0.2000 mg | ORAL_TABLET | Freq: Once | ORAL | Status: DC
Start: 2018-02-21 — End: 2018-02-21

## 2018-02-21 MED ORDER — CLONIDINE HCL 0.1 MG PO TABS
0.2000 mg | ORAL_TABLET | Freq: Once | ORAL | Status: AC
  Administered 2018-02-21: 0.2 mg via ORAL

## 2018-02-21 MED ORDER — SODIUM CHLORIDE 0.9% FLUSH
10.0000 mL | INTRAVENOUS | Status: DC | PRN
  Filled 2018-02-21: qty 10

## 2018-02-21 MED ORDER — METHYLPREDNISOLONE SODIUM SUCC 125 MG IJ SOLR
125.0000 mg | Freq: Once | INTRAMUSCULAR | Status: AC
  Administered 2018-02-21: 125 mg via INTRAVENOUS

## 2018-02-21 MED ORDER — ACETAMINOPHEN 325 MG PO TABS
650.0000 mg | ORAL_TABLET | Freq: Once | ORAL | Status: AC
  Administered 2018-02-21: 650 mg via ORAL

## 2018-02-21 MED ORDER — CLONIDINE HCL 0.1 MG PO TABS
ORAL_TABLET | ORAL | Status: AC
  Filled 2018-02-21: qty 1

## 2018-02-21 MED ORDER — HEPARIN SOD (PORK) LOCK FLUSH 100 UNIT/ML IV SOLN
500.0000 [IU] | Freq: Once | INTRAVENOUS | Status: DC | PRN
  Filled 2018-02-21: qty 5

## 2018-02-21 NOTE — Telephone Encounter (Signed)
Appts already scheduled per 4/12 los.

## 2018-02-21 NOTE — Progress Notes (Signed)
HEMATOLOGY ONCOLOGY CLINIC NOTE  Date of Service: 02/21/18   Patient Care Team: Shawnee Knapp, MD as PCP - General (Family Medicine) [name] as Physician Assistant (Emergency Medicine) [name], MD as Consulting Physician (Orthopedic Surgery)  CHIEF COMPLAINTS F/u for recently diagnosed Marginal Zone lymphoma and was toxicity check with Rituxan  HISTORY OF PRESENTING ILLNESS:   Bryan [name] 72 y.o. male is here because of a referral from ENT [name] regarding a concern of MALT lymphoma.   He is accompanied today by 4 members of his family. The pt reports that he is doing well overall. The pt's PCP is [name]. The pt sees [name] for his Rheumatology. The pt went off of humira a month before his mass appeared two months. The pt takes Methotrexate 15 mg once each week for his rheumatoid arthritis. He notes that he took humira for 8-10 months, and hasn't taken it in about 3 months. He began taking prednisone 15mg  q day, 2 months ago.   He reports having high blood pressure and takes amlodipine, metoprolol for it. He reports having had back and neck surgery for discs.  He also notes having essential tremors in his hands for which he takes primidone.  He reports having run out of flomax. He has had some PTSD from his time in the [place] war.  He notes he has had drenching night sweats for the last 15 years that are of intermittent frequency. He denies any other medical issues.    He first noticed swelling of his left cheek about two months ago. He notes that it was "hard like a rock." He was placed on prednisone for about a month after going to the doctor, with some relief. He then had his needle Bx recorded below.   He reports gum pain that began this morning, and also reports that his tongue feels as if it has bumps on it. He reports taking folic acid 1mg  each day.    On 10/29/17 the pt had a neck CT revealing Multifocal left parotid lesion.  Multiple ill-defined enhancing nodules in the left parotid gland. Asymmetric enhancing lymph nodes in the left neck are not pathologically enlarged however given the asymmetry and the left parotid lesion, they could represent neoplastic spread of parotid malignancy or lymphoma. Tissue sampling recommended.  Of note prior to the patient's visit, pt has had a biopsy of his left parotid gland completed on 12/03/17 with results revealing Atypical lymphoid proliferation. 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 -   B cell clonality study was positive for clonality.   On review of systems, pt reports gum pain, fatigue (not recent), occasional night sweats, ankle pain, and denies abdominal pains, back pain, flank pain, leg swelling, swollen or painful joints beside ankles.   On PMHx the pt has had rheumatoid arthritis for 6 years and HTN. He notes that he has anemia and is a Acupuncturist Witness I- which he notes means he would not want to consider blood products  On Surgical Hx the pt had back surgery. On Social Hx the pt quit smoking in 1976 after smoking about 8 cigarettes each day for 15 years. He notes drinking ETOH about once a week. He denies chemical or radiation exposure, except for agent orange.  On Family Hx the pt notes high blood pressure, DM, but denies autoimmune  conditions, cancers, or blood disorders.   INTERVAL HISTORY:  Bryan [name] is here for a scheduled follow-up of his marginal zone lymphoma and for a toxicity check prior to his 3rd dose of Rituxan. The patient's last visit with [place] was on 02/14/18. He is accompanied today by his wife. The pt reports that he is doing well overall.  The pt reports that he has been feeling exceptionally well and enjoyed his recent fishing trip very much. He notes no new concerns and continues to tolerate the Rituxan  without issue. The pt reports that he is still taking 20mg  Prednisone.   Lab results today (02/21/18) of CBC, CMP, and Reticulocytes is as follows: all values are WNL except for RBC at 3.89, Hgb at 12.8, MCV at 102.6, Glucose at 163, Albumin at 3.2.  On review of systems, pt reports good energy levels, and denies joint pains, wheezing, SOB, and any other symptoms.   REVIEW OF SYSTEMS:   .10 Point review of Systems was done is negative except as noted above.  MEDICAL HISTORY:  Past Medical History:  Diagnosis Date  . Anemia   . Arthritis    hands, knees, cervical area. Back pain. Rheumatoid arthritis- weekly injections.  [name] BPH (benign prostatic hypertrophy)   . Essential tremor   . GERD (gastroesophageal reflux disease)    reports for indigestion he uses mustard   . Hearing deficit    wears hearing aids bilateral  . HTN (hypertension)   . Obesity   . Refusal of blood transfusions as patient is Jehovah's Witness   . Right bundle branch block    history of  . Seizures (HCC)    AS A CHILD. only esential tremors now.  . Sleep apnea    cpap - settings at 9 per patient   . Ulcer   JEHOVA's WITNESS  SURGICAL HISTORY: Past Surgical History:  Procedure Laterality Date  . ANTERIOR CERVICAL DECOMP/DISCECTOMY FUSION N/A 03/30/2013   Procedure: ANTERIOR CERVICAL DECOMPRESSION/DISCECTOMY FUSION 2 LEVELS;  Surgeon: Otilio Connors, MD;  Location: [facility];  Service: Neurosurgery;  Laterality: N/A;  C4-5 C5-6 Anterior cervical decompression/diskectomy/fusion/Allograft/Plate  . CHOLECYSTECTOMY N/A 12/07/2013   Procedure: LAPAROSCOPIC CHOLECYSTECTOMY WITH INTRAOPERATIVE CHOLANGIOGRAM;  Surgeon: Adin Hector, MD;  Location: [facility];  Service: General;  Laterality: N/A;  . COLONOSCOPY    . DECOMPRESSIVE LUMBAR LAMINECTOMY LEVEL 2 N/A 02/15/2015   Procedure: COMPLETE DECOMPRESSIVE LUMBAR LAMINECTOMY L4-L5/ FORAMINOTOMY TO L4 NERVE ROOT AND L5 NERVE ROOT BILATERALLY;  Surgeon: [name], MD;   Location: [facility];  Service: Orthopedics;  Laterality: N/A;  . EYE SURGERY     right, growth excision  . LUMBAR LAMINECTOMY/DECOMPRESSION MICRODISCECTOMY Left 01/18/2016   Procedure:  DECOMPRESSION L4-L5 MICRODISCECTOMY L4-L5 ON LEFT FOR SPINAL STENOSIS;  Surgeon: [name], MD;  Location: [facility];  Service: Orthopedics;  Laterality: Left;  . PAROTIDECTOMY Left 01/24/2018   Procedure: INCISIONAL BIOPSY OF LEFT PAROTID    MASS;  Surgeon: Helayne Seminole, MD;  Location: [facility];  Service: ENT;  Laterality: Left;  . SPINE SURGERY    . TONSILLECTOMY    . VASECTOMY    . WRIST GANGLION EXCISION Left     SOCIAL HISTORY: Social History   Socioeconomic History  . Marital status: Married    Spouse name: Not on file  . Number of children: 2  . Years of education: Not on file  . Highest education level: Not on file  Occupational History  . Occupation: bus Geophysicist/field seismologist  Employer: RETIRED  Social Needs  . Financial resource strain: Not on file  . Food insecurity:    Worry: Not on file    Inability: Not on file  . Transportation needs:    Medical: Not on file    Non-medical: Not on file  Tobacco Use  . Smoking status: Former Smoker    Types: Cigarettes    Last attempt to quit: 12/14/1972    Years since quitting: 45.2  . Smokeless tobacco: Never Used  Substance and Sexual Activity  . Alcohol use: Yes    Comment: occasionally  . Drug use: No  . Sexual activity: Yes    Birth control/protection: None  Lifestyle  . Physical activity:    Days per week: Not on file    Minutes per session: Not on file  . Stress: Not on file  Relationships  . Social connections:    Talks on phone: Not on file    Gets together: Not on file    Attends religious service: Not on file    Active member of club or organization: Not on file    Attends meetings of clubs or organizations: Not on file    Relationship status: Not on file  . Intimate partner violence:    Fear of current or ex partner: Not on file     Emotionally abused: Not on file    Physically abused: Not on file    Forced sexual activity: Not on file  Other Topics Concern  . Not on file  Social History Narrative  . Not on file    FAMILY HISTORY: Family History  Problem Relation Age of Onset  . Colon cancer Maternal Grandfather   . Kidney disease Brother   . Stroke Maternal Grandmother   . Diabetes Paternal Aunt        x 4 aunts    ALLERGIES:  is allergic to lisinopril and other.  MEDICATIONS:  Current Outpatient Medications  Medication Sig Dispense Refill  . aspirin EC 81 MG tablet Take 81 mg by mouth daily.    [name] gabapentin (NEURONTIN) 300 MG capsule TAKE 1 CAPSULE BY MOUTH THREE TIMES DAILY 90 capsule 0  . hydrochlorothiazide (HYDRODIURIL) 25 MG tablet TAKE 1 TABLET BY MOUTH ONCE DAILY (Patient taking differently: TAKE 25 MG BY MOUTH ONCE DAILY) 30 tablet 6  . metoprolol succinate (TOPROL-XL) 50 MG 24 hr tablet TAKE 1 TABLET BY MOUTH ONCE DAILY. TAKE WITH OR IMMEDIATELY FOLLOWING A MEAL (Patient taking differently: TAKE 50 MG BY MOUTH ONCE DAILY. TAKE WITH OR IMMEDIATELY FOLLOWING A MEAL) 90 tablet 3  . primidone (MYSOLINE) 50 MG tablet Take 50 mg by mouth 2 (two) times daily.     . ranitidine (ZANTAC) 150 MG tablet Take 1 tablet (150 mg total) by mouth 2 (two) times daily. (Patient taking differently: Take 150 mg by mouth daily. ) 60 tablet 0  . Tamsulosin HCl (FLOMAX) 0.4 MG CAPS Take 0.4 mg by mouth at bedtime.     . traMADol (ULTRAM) 50 MG tablet Take 1 tablet (50 mg total) every 8 (eight) hours as needed by mouth for severe pain. 30 tablet 0   No current facility-administered medications for this visit.     PHYSICAL EXAMINATION: ECOG PERFORMANCE STATUS: 1 - Symptomatic but completely ambulatory  Vitals:   02/21/18 1128  BP: (!) 162/79  Pulse: 80  Resp: 18  Temp: 98.6 F (37 C)  SpO2: 99%   Filed Weights   02/21/18 1128  Weight: 239 lb  6.4 oz (108.6 kg)   GENERAL:alert, in no acute distress and  comfortable SKIN: no acute rashes, no significant lesions EYES: conjunctiva are pink and non-injected, sclera anicteric OROPHARYNX: MMM, no exudates, no oropharyngeal erythema or ulceration NECK: supple, no JVD LYMPH:  no palpable lymphadenopathy in the cervical, axillary or inguinal regions LUNGS: clear to auscultation b/l with normal respiratory effort HEART: regular rate & rhythm ABDOMEN:  normoactive bowel sounds , non tender, not distended. Extremity: no pedal edema PSYCH: alert & oriented x 3 with fluent speech NEURO: no focal motor/sensory deficits    LABS   CBC Latest Ref Rng & Units 02/14/2018 02/07/2018 01/22/2018  WBC 4.0 - 10.3 K/uL 8.4 7.1 7.4  Hemoglobin 13.0 - 17.0 g/dL - - 14.0  Hematocrit 38.4 - 49.9 % 42.3 41.7 41.9  Platelets 140 - 400 K/uL 187 238 257   HGB 13.8  CBC    Component Value Date/Time   WBC 8.4 02/14/2018 1054   WBC 7.4 01/22/2018 0914   RBC 4.18 (L) 02/14/2018 1054   RBC 4.18 (L) 02/14/2018 1054   HGB 14.0 01/22/2018 0914   HCT 42.3 02/14/2018 1054   HCT 42.7 12/19/2017 1416   PLT 187 02/14/2018 1054   MCV 101.2 (H) 02/14/2018 1054   MCV 96.7 06/10/2017 1540   MCH 33.0 02/14/2018 1054   MCHC 32.6 02/14/2018 1054   RDW 13.3 02/14/2018 1054   LYMPHSABS 0.7 (L) 02/14/2018 1054   MONOABS 0.3 02/14/2018 1054   EOSABS 0.1 02/14/2018 1054   BASOSABS 0.0 02/14/2018 1054    CMP Latest Ref Rng & Units 02/14/2018 02/07/2018 01/22/2018  Glucose 70 - 140 mg/dL 177(H) 155(H) 110(H)  BUN 7 - 26 mg/dL 20 20 20   Creatinine 0.70 - 1.30 mg/dL 1.26 1.18 1.25(H)  Sodium 136 - 145 mmol/L 140 141 137  Potassium 3.5 - 5.1 mmol/L 3.9 3.4(L) 3.6  Chloride 98 - 109 mmol/L 103 103 98(L)  CO2 22 - 29 mmol/L 28 28 27   Calcium 8.4 - 10.4 mg/dL 8.9 8.8 8.8(L)  Total Protein 6.4 - 8.3 g/dL 6.8 6.8 -  Total Bilirubin 0.2 - 1.2 mg/dL 0.4 0.3 -  Alkaline Phos 40 - 150 U/L 96 94 -  AST 5 - 34 U/L 17 19 -  ALT 0 - 55 U/L 21 26 -   . Lab Results  Component Value Date    LDH 215 02/07/2018    Component     Latest Ref Rng & Units 12/19/2017  Iron     42 - 163 ug/dL 130  TIBC     202 - 409 ug/dL 331  Saturation Ratios     42 - 163 % 39 (L)  UIBC     ug/dL 201  Folate, Hemolysate     Not Estab. ng/mL 565.3  HCT     37.5 - 51.0 % 42.7  Folate, RBC     >498 ng/mL 1,324  Vitamin B12     180 - 914 pg/mL 264  Ferritin     22 - 316 ng/mL 42  Hepatitis B Surface Ag     Negative Negative  Hep B Core Ab, Tot     Negative Negative  HCV Ab     0.0 - 0.9 s/co ratio <0.1  LDH     125 - 245 U/L 287 (H)   LABORATORY DATA:    01/27/18 Flow Cytometry:   01/27/18 Parotid Gland Surgical Pathology   RADIOGRAPHIC STUDIES: I have personally reviewed the radiological images  as listed and agreed with the findings in the report. No results found.  ASSESSMENT & PLAN:   72 y.o. is a  male with    1. Newly diagnosed Stage IIE Marginal-zone lymphoma involving the left parotid gland, left cervical LN and mediastinal lymph nodes. . Lab Results  Component Value Date   LDH 215 02/07/2018   Hep C neg and Hep B  2. Jehovas Witness- refusing any blood products for transfusion PLAN -Discussed pt labwork today 02/21/18; blood counts are stable.  -No prohibitive toxicities from Rituxan at this time -- will continue with same pre-medications. -patient wanted to hold off on albuterol neb with premeds which were added previously due to mild wheezing . -we shall continued to hold off on rapid Rituxan protocol as a result. -EPO and other non-blood products would be considered if his RBC numbers drop, as opposed to PRBC since the pt notes he is opposed to blood transfusions given that he is a Jehovah's Witness.     2. Rheumatoid Arthritis - off  MTX and is currently on prednisone per his rheumatologist. -may temporarily increase Prednisone to 20mg  po daily -Rituxan might help with some control of his RA. Other treatments based on symptoms post-rituxan. -will try  to taper prednisone after Rituxan treatment started. This was discussed with patient --he wanted to wait a few weeks prior to prednisone taper.   3. Low B12 levels -continue B12 SL 1043mcg SL daily  4. Wheezing -We will order lung function testing due to the patient's new wheezing and order a Nebulizer today as well.  -Will also add Albuterol to patient's pre-meds prior to rituxan administration   F/u for 4th and last dose of Rituxan as per schedule in 1 week and f/u per appointment with Dr Irene Limbo with labs on 5/7  Will need Prevnar and Pneumovax post rituxan  All of the patient's questions were answered with apparent satisfaction. The patient knows to call the clinic with any problems, questions or concerns.  . The total time spent in the appointment was 15 minutes and more than 50% was on counseling and direct patient cares.     Sullivan Lone MD Valinda AAHIVMS [facility] Hematology/Oncology Physician [facility]  (Office):       [phone] (Work cell):  [phone] (Fax):           [phone]   This document serves as a record of services personally performed by Sullivan Lone, MD. It was created on his behalf by Baldwin Jamaica, a trained medical scribe. The creation of this record is based on the scribe's personal observations and the provider's statements to them.   .I have reviewed the above documentation for accuracy and completeness, and I agree with the above. [name] MD MS

## 2018-02-21 NOTE — Patient Instructions (Signed)
Thank you for choosing [facility] to provide your oncology and hematology care.  To afford each patient quality time with our providers, please arrive 30 minutes before your scheduled appointment time.  If you arrive late for your appointment, you may be asked to reschedule.  We strive to give you quality time with our providers, and arriving late affects you and other patients whose appointments are after yours.   If you are a no show for multiple scheduled visits, you may be dismissed from the clinic at the providers discretion.    Again, thank you for choosing [facility], our hope is that these requests will decrease the amount of time that you wait before being seen by our physicians.  ______________________________________________________________________  Should you have questions after your visit to the [facility], please contact our office at [phone] between the hours of 8:30 and 4:30 p.m.    Voicemails left after 4:30p.m will not be returned until the following business day.    For prescription refill requests, please have your pharmacy contact us directly.  Please also try to allow 48 hours for prescription requests.    Please contact the scheduling department for questions regarding scheduling.  For scheduling of procedures such as PET scans, CT scans, MRI, Ultrasound, etc please contact central scheduling at [phone].    Resources For Cancer Patients and Caregivers:   Oncolink.org:  A wonderful resource for patients and healthcare providers for information regarding your disease, ways to tract your treatment, what to expect, etc.     American Cancer Society:  800-227-2345  Can help patients locate various types of support and financial assistance  Cancer Care: 1-800-813-HOPE (4673) Provides financial assistance, online support groups, medication/co-pay assistance.    [place] DSS:  336-641-3447 Where to apply for food  stamps, Medicaid, and utility assistance  Medicare Rights Center: 800-333-4114 Helps people with Medicare understand their rights and benefits, navigate the Medicare system, and secure the quality healthcare they deserve  SCAT: 336-333-6589 [employer]'s shared-ride transportation service for eligible riders who have a disability that prevents them from riding the fixed route bus.    For additional information on assistance programs please contact our social worker:   Grier Hock/Abigail Elmore:  [phone]            

## 2018-02-21 NOTE — Progress Notes (Signed)
Per Alfredia Client, okay to increase rate of infusion. Pt had been tolerating his infusion and BP back to baseline 160's/80's. Pt asymptomatic at this time. NOtified Dr.Magrinat, who is on call and is aware.

## 2018-02-21 NOTE — Progress Notes (Signed)
At 1447 with BP of 182/83 HR 71,Infusion was paused, BP rechecked at 1452 with BP of 195/82,Van at chairside at 1500 assessing the pt and ordered Benadryl 25mg  IVP;At 1515 with BP of 195/81, Lucianne Lei T. Ordered 0.1mg  of Clonidine PO pt able to urinate of 289ml.  1541--with BP of 195/79, pt's face is flushed and he said he feels hot. Lucianne Lei T at chairside reevaluating the pt.     Sandi Mealy ordered another clonidine 0.2 mg PO and he said to resume infusion.

## 2018-02-21 NOTE — Patient Instructions (Signed)
[facility] Discharge Instructions for Patients Receiving Chemotherapy  Today you received the following chemotherapy agents:  Rituxan.  To help prevent nausea and vomiting after your treatment, we encourage you to take your nausea medication as directed.   If you develop nausea and vomiting that is not controlled by your nausea medication, call the clinic.   BELOW ARE SYMPTOMS THAT SHOULD BE REPORTED IMMEDIATELY:  *FEVER GREATER THAN 100.5 F  *CHILLS WITH OR WITHOUT FEVER  NAUSEA AND VOMITING THAT IS NOT CONTROLLED WITH YOUR NAUSEA MEDICATION  *UNUSUAL SHORTNESS OF BREATH  *UNUSUAL BRUISING OR BLEEDING  TENDERNESS IN MOUTH AND THROAT WITH OR WITHOUT PRESENCE OF ULCERS  *URINARY PROBLEMS  *BOWEL PROBLEMS  UNUSUAL RASH Items with * indicate a potential emergency and should be followed up as soon as possible.  Feel free to call the clinic should you have any questions or concerns. The clinic phone number is [phone].  Please show the CHEMO ALERT CARD at check-in to the Emergency Department and triage nurse.   

## 2018-02-23 ENCOUNTER — Other Ambulatory Visit: Payer: Self-pay | Admitting: Family Medicine

## 2018-02-24 ENCOUNTER — Ambulatory Visit (INDEPENDENT_AMBULATORY_CARE_PROVIDER_SITE_OTHER): Payer: Medicare HMO | Admitting: Family Medicine

## 2018-02-24 ENCOUNTER — Encounter: Payer: Self-pay | Admitting: Family Medicine

## 2018-02-24 ENCOUNTER — Other Ambulatory Visit: Payer: Self-pay

## 2018-02-24 VITALS — BP 122/78 | HR 98 | Temp 98.4°F | Ht 64.57 in | Wt 230.4 lb

## 2018-02-24 DIAGNOSIS — I10 Essential (primary) hypertension: Secondary | ICD-10-CM

## 2018-02-24 DIAGNOSIS — C8580 Other specified types of non-Hodgkin lymphoma, unspecified site: Secondary | ICD-10-CM

## 2018-02-24 DIAGNOSIS — M05742 Rheumatoid arthritis with rheumatoid factor of left hand without organ or systems involvement: Secondary | ICD-10-CM

## 2018-02-24 DIAGNOSIS — M05741 Rheumatoid arthritis with rheumatoid factor of right hand without organ or systems involvement: Secondary | ICD-10-CM | POA: Diagnosis not present

## 2018-02-24 MED ORDER — HYDROCHLOROTHIAZIDE 25 MG PO TABS: ORAL_TABLET | ORAL | 3 refills | Status: DC

## 2018-02-24 NOTE — Patient Instructions (Addendum)
IF you received an x-ray today, you will receive an invoice from [facility]. Please contact [facility] [place] [facility] at [phone] with questions or concerns regarding your invoice.   IF you received labwork today, you will receive an invoice from [facility]. Please contact [facility] at [phone] with questions or concerns regarding your invoice.   Our billing staff will not be able to assist you with questions regarding bills from these companies.  You will be contacted with the lab results as soon as they are available. The fastest way to get your results is to activate your My Chart account. Instructions are located on the last page of this paperwork. If you have not heard from [place] regarding the results in 2 weeks, please contact this office.      To learn more about antibiotic prescribing and use, visit [email or web address].   DASH Eating Plan DASH stands for "Dietary Approaches to Stop Hypertension." The DASH eating plan is a healthy eating plan that has been shown to reduce high blood pressure (hypertension). It may also reduce your risk for type 2 diabetes, heart disease, and stroke. The DASH eating plan may also help with weight loss. What are tips for following this plan? General guidelines  Avoid eating more than 2,300 mg (milligrams) of salt (sodium) a day. If you have hypertension, you may need to reduce your sodium intake to 1,500 mg a day.  Limit alcohol intake to no more than 1 drink a day for nonpregnant women and 2 drinks a day for men. One drink equals 12 oz of beer, 5 oz of wine, or 1 oz of hard liquor.  Work with your health care provider to maintain a healthy body weight or to lose weight. Ask what an ideal weight is for you.  Get at least 30 minutes of exercise that causes your heart to beat faster (aerobic exercise) most days of the week. Activities may include walking, swimming, or biking.  Work with your health care provider or diet and  nutrition specialist (dietitian) to adjust your eating plan to your individual calorie needs. Reading food labels  Check food labels for the amount of sodium per serving. Choose foods with less than 5 percent of the Daily Value of sodium. Generally, foods with less than 300 mg of sodium per serving fit into this eating plan.  To find whole grains, look for the word "whole" as the first word in the ingredient list. Shopping  Buy products labeled as "low-sodium" or "no salt added."  Buy fresh foods. Avoid canned foods and premade or frozen meals. Cooking  Avoid adding salt when cooking. Use salt-free seasonings or herbs instead of table salt or sea salt. Check with your health care provider or pharmacist before using salt substitutes.  Do not fry foods. Cook foods using healthy methods such as baking, boiling, grilling, and broiling instead.  Cook with heart-healthy oils, such as olive, canola, soybean, or sunflower oil. Meal planning   Eat a balanced diet that includes: ? 5 or more servings of fruits and vegetables each day. At each meal, try to fill half of your plate with fruits and vegetables. ? Up to 6-8 servings of whole grains each day. ? Less than 6 oz of lean meat, poultry, or fish each day. A 3-oz serving of meat is about the same size as a deck of cards. One egg equals 1 oz. ? 2 servings of low-fat dairy each day. ? A serving of nuts, seeds, or beans 5 times  each week. ? Heart-healthy fats. Healthy fats called Omega-3 fatty acids are found in foods such as flaxseeds and coldwater fish, like sardines, salmon, and mackerel.  Limit how much you eat of the following: ? Canned or prepackaged foods. ? Food that is high in trans fat, such as fried foods. ? Food that is high in saturated fat, such as fatty meat. ? Sweets, desserts, sugary drinks, and other foods with added sugar. ? Full-fat dairy products.  Do not salt foods before eating.  Try to eat at least 2 vegetarian  meals each week.  Eat more home-cooked food and less restaurant, buffet, and fast food.  When eating at a restaurant, ask that your food be prepared with less salt or no salt, if possible. What foods are recommended? The items listed may not be a complete list. Talk with your dietitian about what dietary choices are best for you. Grains Whole-grain or whole-wheat bread. Whole-grain or whole-wheat pasta. Brown rice. [place]. Bulgur. Whole-grain and low-sodium cereals. Pita bread. Low-fat, low-sodium crackers. Whole-wheat flour tortillas. Vegetables Fresh or frozen vegetables (raw, steamed, roasted, or grilled). Low-sodium or reduced-sodium tomato and vegetable juice. Low-sodium or reduced-sodium tomato sauce and tomato paste. Low-sodium or reduced-sodium canned vegetables. Fruits All fresh, dried, or frozen fruit. Canned fruit in natural juice (without added sugar). Meat and other protein foods Skinless chicken or [place]. Ground chicken or [place]. Pork with fat trimmed off. Fish and seafood. Egg whites. Dried beans, peas, or lentils. Unsalted nuts, nut butters, and seeds. Unsalted canned beans. Lean cuts of beef with fat trimmed off. Low-sodium, lean deli meat. Dairy Low-fat (1%) or fat-free (skim) milk. Fat-free, low-fat, or reduced-fat cheeses. Nonfat, low-sodium ricotta or cottage cheese. Low-fat or nonfat yogurt. Low-fat, low-sodium cheese. Fats and oils Soft margarine without trans fats. Vegetable oil. Low-fat, reduced-fat, or light mayonnaise and salad dressings (reduced-sodium). Canola, safflower, olive, soybean, and sunflower oils. Avocado. Seasoning and other foods Herbs. Spices. Seasoning mixes without salt. Unsalted popcorn and pretzels. Fat-free sweets. What foods are not recommended? The items listed may not be a complete list. Talk with your dietitian about what dietary choices are best for you. Grains Baked goods made with fat, such as croissants, muffins, or some  breads. Dry pasta or rice meal packs. Vegetables Creamed or fried vegetables. Vegetables in a cheese sauce. Regular canned vegetables (not low-sodium or reduced-sodium). Regular canned tomato sauce and paste (not low-sodium or reduced-sodium). Regular tomato and vegetable juice (not low-sodium or reduced-sodium). Angie Fava. Olives. Fruits Canned fruit in a light or heavy syrup. Fried fruit. Fruit in cream or butter sauce. Meat and other protein foods Fatty cuts of meat. Ribs. Fried meat. Berniece Salines. Sausage. Bologna and other processed lunch meats. Salami. Fatback. Hotdogs. Bratwurst. Salted nuts and seeds. Canned beans with added salt. Canned or smoked fish. Whole eggs or egg yolks. Chicken or [place] with skin. Dairy Whole or 2% milk, cream, and half-and-half. Whole or full-fat cream cheese. Whole-fat or sweetened yogurt. Full-fat cheese. Nondairy creamers. Whipped toppings. Processed cheese and cheese spreads. Fats and oils Butter. Stick margarine. Lard. Shortening. Ghee. Bacon fat. Tropical oils, such as coconut, palm kernel, or palm oil. Seasoning and other foods Salted popcorn and pretzels. Onion salt, garlic salt, seasoned salt, table salt, and sea salt. Worcestershire sauce. Tartar sauce. Barbecue sauce. Teriyaki sauce. Soy sauce, including reduced-sodium. Steak sauce. Canned and packaged gravies. Fish sauce. Oyster sauce. Cocktail sauce. Horseradish that you find on the shelf. Ketchup. Mustard. Meat flavorings and tenderizers. Bouillon cubes. Hot sauce  and Tabasco sauce. Premade or packaged marinades. Premade or packaged taco seasonings. Relishes. Regular salad dressings. Where to find more information:  National Heart, Lung, and [place]: https://wilson-eaton.com/  American Heart Association: www.heart.org Summary  The DASH eating plan is a healthy eating plan that has been shown to reduce high blood pressure (hypertension). It may also reduce your risk for type 2 diabetes, heart disease, and  stroke.  With the DASH eating plan, you should limit salt (sodium) intake to 2,300 mg a day. If you have hypertension, you may need to reduce your sodium intake to 1,500 mg a day.  When on the DASH eating plan, aim to eat more fresh fruits and vegetables, whole grains, lean proteins, low-fat dairy, and heart-healthy fats.  Work with your health care provider or diet and nutrition specialist (dietitian) to adjust your eating plan to your individual calorie needs. This information is not intended to replace advice given to you by your health care provider. Make sure you discuss any questions you have with your health care provider. Document Released: 10/18/2011 Document Revised: 10/22/2016 Document Reviewed: 10/22/2016 Elsevier Interactive Patient Education  [employer].

## 2018-02-24 NOTE — Progress Notes (Signed)
[facility] Progress Note   Bryan [name] [id number] February 15, 1946 72 y.o.  Bryan [name] is managed by Dr. Sullivan Lone  Actively treated with chemotherapy: yes  Current Therapy: Rituximab  Last Treated: 04 / 12 / 2019 (cycle 3, day 1)  Assessment: Plan:    Hypertension, unspecified type   Hypertension: The patient was given a total of 0.3 mg of clonidine.  He was initially given 0.1 mg after it was noted that his blood pressure was 195/81.  He had also received Benadryl 25 mg IV x1.  Despite these efforts his blood pressure continued to be elevated at 179/82.  He was given an additional 0.2 mg of clonidine.  His blood pressure eventually returned to 160's/80's.  The patient was noted to have eaten a bag of salted potato chips biscuits and [place] fries from a [employer].  He was told that he needed to decrease his intake of sodium.  He is scheduled to be seen by his primary care provider next week to discuss his hypertension.  Please see After Visit Summary for patient specific instructions.  Future Appointments  Date Time Provider [place]  02/26/2018  9:00 AM [facility] TECH [facility] None  02/28/2018 11:30 AM [facility] LAB 2 [facility] None  02/28/2018 12:30 PM [facility] A3 [facility] None  03/18/2018  8:45 AM [facility] LAB 5 [facility] None  03/18/2018  9:20 AM Kale, Cloria [place], MD [facility] None    No orders of the defined types were placed in this encounter.      Subjective:   Patient ID:  Bryan [name] is a 72 y.o. (DOB 10-Nov-1946) male.  Chief Complaint: No chief complaint on file.   HPI Bryan [name] is a 72 year old male with a history of a marginal zone lymphoma.  He is managed by Dr. Sullivan Lone.  He presented today for weekly rituximab.  He has been told not to take his blood pressure medicines prior to presenting for treatment due to the possibility of hypotension.  He is currently on hydrochlorothiazide 25 mg once  daily and Toprol-XL 50 mg once daily.  This provider was asked to see the patient when he was noted to have a blood pressure 182/83.  Rituximab was paused.  At the time that I saw the patient is blood pressure was 195/82.  He was given Benadryl 25 mg IV x1.  Subsequently his blood pressure was found to be 195/81.  He was given clonidine 0.1 mg p.o.  His repeat blood pressure only dropped to 179/82.  He was given an additional 0.2 mg of clonidine.  His blood pressure finally stabilized in the 160's/80's.  He denies shortness of breath chest pain or palpitations.  During my encounter with the patient it was noted that he had eaten a bag of salted potato chips.  Additionally he was noted to be eating a biscuit and [place] fries from a [employer].  He is scheduled to see his primary care provider next week to discuss his blood pressure.  Medications: I have reviewed the patient's current medications.  Allergies:  Allergies  Allergen Reactions  . Lisinopril Cough  . 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 Anemia   . Arthritis    hands, knees, cervical area. Back pain. Rheumatoid arthritis- weekly injections.  [name] BPH (benign prostatic hypertrophy)   . Essential tremor   .  GERD (gastroesophageal reflux disease)    reports for indigestion he uses mustard   . Hearing deficit    wears hearing aids bilateral  . HTN (hypertension)   . Obesity   . Refusal of blood transfusions as patient is Jehovah's Witness   . Right bundle branch block    history of  . Seizures (HCC)    AS A CHILD. only esential tremors now.  . Sleep apnea    cpap - settings at 9 per patient   . Ulcer     Past Surgical History:  Procedure Laterality Date  . ANTERIOR CERVICAL DECOMP/DISCECTOMY FUSION N/A 03/30/2013   Procedure: ANTERIOR CERVICAL DECOMPRESSION/DISCECTOMY FUSION 2 LEVELS;  Surgeon: Otilio Connors, MD;  Location: [place]  NEUR[facility] ORS;  Service: Neurosurgery;  Laterality: N/A;  C4-5 C5-6 Anterior cervical decompression/diskectomy/fusion/Allograft/Plate  . CHOLECYSTECTOMY N/A 12/07/2013   Procedure: LAPAROSCOPIC CHOLECYSTECTOMY WITH INTRAOPERATIVE CHOLANGIOGRAM;  Surgeon: Adin Hector, MD;  [facility];  Service: General;  Laterality: N/A;  . COLONOSCOPY    . DECOMPRESSIVE LUMBAR LAMINECTOMY LEVEL 2 N/A 02/15/2015   Procedure: COMPLETE DECOMPRESSIVE LUMBAR LAMINECTOMY L4-L5/ FORAMINOTOMY TO L4 NERVE ROOT AND L5 NERVE ROOT BILATERALLY;  Surgeon: [name], MD;  [facility];  Service: Orthopedics;  Laterality: N/A;  . EYE SURGERY     right, growth excision  . LUMBAR LAMINECTOMY/DECOMPRESSION MICRODISCECTOMY Left 01/18/2016   Procedure:  DECOMPRESSION L4-L5 MICRODISCECTOMY L4-L5 ON LEFT FOR SPINAL STENOSIS;  Surgeon: [name], MD;  [facility];  Service: Orthopedics;  Laterality: Left;  . PAROTIDECTOMY Left 01/24/2018   Procedure: INCISIONAL BIOPSY OF LEFT PAROTID    MASS;  Surgeon: Helayne Seminole, MD;  Location: [place];  Service: ENT;  Laterality: Left;  . SPINE SURGERY    . TONSILLECTOMY    . VASECTOMY    . WRIST GANGLION EXCISION Left     Family History  Problem Relation Age of Onset  . Colon cancer Maternal Grandfather   . Kidney disease Brother   . Stroke Maternal Grandmother   . Diabetes Paternal Aunt        x 4 aunts    Social History   Socioeconomic History  . Marital status: Married    Spouse name: Not on file  . Number of children: 2  . Years of education: Not on file  . Highest education level: Not on file  Occupational History  . Occupation: bus Education administrator: RETIRED  Social Needs  . Financial resource strain: Not on file  . Food insecurity:    Worry: Not on file    Inability: Not on file  . Transportation needs:    Medical: Not on file    Non-medical: Not on file  Tobacco Use  . Smoking status: Former Smoker    Types: Cigarettes    Last attempt  to quit: 12/14/1972    Years since quitting: 45.2  . Smokeless tobacco: Never Used  Substance and Sexual Activity  . Alcohol use: Yes    Comment: occasionally  . Drug use: No  . Sexual activity: Yes    Birth control/protection: None  Lifestyle  . Physical activity:    Days per week: Not on file    Minutes per session: Not on file  . Stress: Not on file  Relationships  . Social connections:    Talks on phone: Not on file    Gets together: Not on file    Attends religious service: Not on file  Active member of club or organization: Not on file    Attends meetings of clubs or organizations: Not on file    Relationship status: Not on file  . Intimate partner violence:    Fear of current or ex partner: Not on file    Emotionally abused: Not on file    Physically abused: Not on file    Forced sexual activity: Not on file  Other Topics Concern  . Not on file  Social History Narrative  . Not on file    Past Medical History, Surgical history, Social history, and Family history were reviewed and updated as appropriate.   Please see review of systems for further details on the patient's review from today.   Review of Systems:  Review of Systems  Constitutional: Negative for chills and diaphoresis.  HENT: Negative for congestion, facial swelling and trouble swallowing.   Respiratory: Negative for cough, choking, chest tightness, shortness of breath and wheezing.   Cardiovascular: Negative for chest pain and palpitations.  Gastrointestinal: Negative for nausea and vomiting.  Musculoskeletal: Negative for back pain.  Skin: Negative for rash.  Neurological: Negative for dizziness, speech difficulty and headaches.  Psychiatric/Behavioral: The patient is not nervous/anxious.     Objective:   Physical Exam:  There were no vitals taken for this visit. ECOG: 0  Physical Exam  Constitutional: No distress.  HENT:  Head: Normocephalic and atraumatic.  Cardiovascular: Normal rate,  regular rhythm and normal heart sounds. Exam reveals no gallop and no friction rub.  No murmur heard. Pulmonary/Chest: Effort normal and breath sounds normal. No respiratory distress. He has no wheezes. He has no rales.  Neurological: He is alert.  Skin: Skin is warm and dry. No rash noted. He is not diaphoretic. No erythema.    Lab Review:     Component Value Date/Time   NA 142 02/21/2018 1106   K 3.8 02/21/2018 1106   CL 105 02/21/2018 1106   CO2 28 02/21/2018 1106   GLUCOSE 163 (H) 02/21/2018 1106   BUN 17 02/21/2018 1106   CREATININE 1.01 02/21/2018 1106   CREATININE 1.12 03/31/2016 1031   CALCIUM 8.7 02/21/2018 1106   PROT 6.5 02/21/2018 1106   ALBUMIN 3.2 (L) 02/21/2018 1106   AST 17 02/21/2018 1106   ALT 27 02/21/2018 1106   ALKPHOS 98 02/21/2018 1106   BILITOT 0.3 02/21/2018 1106   GFRNONAA >60 02/21/2018 1106   GFRNONAA 67 12/19/2015 1605   GFRAA >60 02/21/2018 1106   GFRAA 78 12/19/2015 1605       Component Value Date/Time   WBC 7.4 02/21/2018 1106   RBC 3.89 (L) 02/21/2018 1106   RBC 3.89 (L) 02/21/2018 1106   HGB 12.8 (L) 02/21/2018 1106   HCT 39.9 02/21/2018 1106   HCT 42.7 12/19/2017 1416   PLT 168 02/21/2018 1106   PLT 187 02/14/2018 1054   MCV 102.6 (H) 02/21/2018 1106   MCV 96.7 06/10/2017 1540   MCH 32.9 02/21/2018 1106   MCHC 32.1 02/21/2018 1106   RDW 13.7 02/21/2018 1106   LYMPHSABS 1.0 02/21/2018 1106   MONOABS 0.2 02/21/2018 1106   EOSABS 0.1 02/21/2018 1106   BASOSABS 0.0 02/21/2018 1106   -------------------------------  Imaging from last 24 hours (if applicable):  Radiology interpretation: No results found.

## 2018-02-24 NOTE — Progress Notes (Signed)
Chief Complaint  Patient presents with  . Hypertension    needs for refill of hydrochlorothiazide 25mg     HPI   Pt is here for hypertension. Hypertension: Patient here for follow-up of elevated blood pressure.  He was diagnosed with lymphoma and on 02/21/18 he was getting an infusion. At that time he admits to being nervous about the blood pressure. He states that he was feeling well otherwise. He denies chest pains, palpitations, shortness of breath. He was at the beach the week before and may have run out of his home bp meds but he restarted it.  He states that he didn't even catch one fish and he thinks it may have led to his blood pressure.  He had to leave his fishing trip early to go in for the infusion.   BP Readings from Last 3 Encounters:  02/24/18 122/78  02/21/18 (!) 161/68  02/21/18 (!) 162/79    Review from the medical record Lymphoma- updates Marginal zone lymphoma with Rituxan He is getting ready for his last infusion He sees Dr. Irene Limbo for Oncology He will need prevnar and pneumovax reissued after Rituxan   Rheumatoid Arthritis Pt is now off humira and methotrexate MTX He is on prednisone and rituxan He stays his pain in his low back is still bothersome He states that humira helped that greatly.   Past Medical History:  Diagnosis Date  . Anemia   . Arthritis    hands, knees, cervical area. Back pain. Rheumatoid arthritis- weekly injections.  [name] BPH (benign prostatic hypertrophy)   . Essential tremor   . GERD (gastroesophageal reflux disease)    reports for indigestion he uses mustard   . Hearing deficit    wears hearing aids bilateral  . HTN (hypertension)   . Obesity   . Refusal of blood transfusions as patient is Jehovah's Witness   . Right bundle branch block    history of  . Seizures (HCC)    AS A CHILD. only esential tremors now.  . Sleep apnea    cpap - settings at 9 per patient   . Ulcer     Current Outpatient Medications  Medication Sig  Dispense Refill  . gabapentin (NEURONTIN) 300 MG capsule TAKE 1 CAPSULE BY MOUTH THREE TIMES DAILY 90 capsule 0  . hydrochlorothiazide (HYDRODIURIL) 25 MG tablet TAKE 1 TABLET BY MOUTH ONCE DAILY (Patient taking differently: TAKE 25 MG BY MOUTH ONCE DAILY) 30 tablet 6  . metoprolol succinate (TOPROL-XL) 50 MG 24 hr tablet TAKE 1 TABLET BY MOUTH ONCE DAILY. TAKE WITH OR IMMEDIATELY FOLLOWING A MEAL (Patient taking differently: TAKE 50 MG BY MOUTH ONCE DAILY. TAKE WITH OR IMMEDIATELY FOLLOWING A MEAL) 90 tablet 3  . primidone (MYSOLINE) 50 MG tablet Take 50 mg by mouth 2 (two) times daily.     . Tamsulosin HCl (FLOMAX) 0.4 MG CAPS Take 0.4 mg by mouth at bedtime.     . traMADol (ULTRAM) 50 MG tablet Take 1 tablet (50 mg total) every 8 (eight) hours as needed by mouth for severe pain. 30 tablet 0  . aspirin EC 81 MG tablet Take 81 mg by mouth daily.    . ranitidine (ZANTAC) 150 MG tablet Take 1 tablet (150 mg total) by mouth 2 (two) times daily. (Patient not taking: Reported on 02/24/2018) 60 tablet 0   No current facility-administered medications for this visit.     Allergies:  Allergies  Allergen Reactions  . Lisinopril Cough  . Other Other (See Comments)  BLOOD PRODUCT REFUSAL (patient is a Sales promotion account executive Witness)    Past Surgical History:  Procedure Laterality Date  . ANTERIOR CERVICAL DECOMP/DISCECTOMY FUSION N/A 03/30/2013   Procedure: ANTERIOR CERVICAL DECOMPRESSION/DISCECTOMY FUSION 2 LEVELS;  Surgeon: Otilio Connors, MD;  Location: [facility];  Service: Neurosurgery;  Laterality: N/A;  C4-5 C5-6 Anterior cervical decompression/diskectomy/fusion/Allograft/Plate  . CHOLECYSTECTOMY N/A 12/07/2013   Procedure: LAPAROSCOPIC CHOLECYSTECTOMY WITH INTRAOPERATIVE CHOLANGIOGRAM;  Surgeon: Adin Hector, MD;  Location: [facility];  Service: General;  Laterality: N/A;  . COLONOSCOPY    . DECOMPRESSIVE LUMBAR LAMINECTOMY LEVEL 2 N/A 02/15/2015   Procedure: COMPLETE DECOMPRESSIVE LUMBAR LAMINECTOMY  L4-L5/ FORAMINOTOMY TO L4 NERVE ROOT AND L5 NERVE ROOT BILATERALLY;  Surgeon: [name], MD;  Location: [facility];  Service: Orthopedics;  Laterality: N/A;  . EYE SURGERY     right, growth excision  . LUMBAR LAMINECTOMY/DECOMPRESSION MICRODISCECTOMY Left 01/18/2016   Procedure:  DECOMPRESSION L4-L5 MICRODISCECTOMY L4-L5 ON LEFT FOR SPINAL STENOSIS;  Surgeon: [name], MD;  Location: [facility];  Service: Orthopedics;  Laterality: Left;  . PAROTIDECTOMY Left 01/24/2018   Procedure: INCISIONAL BIOPSY OF LEFT PAROTID    MASS;  Surgeon: Helayne Seminole, MD;  Location: [facility];  Service: ENT;  Laterality: Left;  . SPINE SURGERY    . TONSILLECTOMY    . VASECTOMY    . WRIST GANGLION EXCISION Left     Social History   Socioeconomic History  . Marital status: Married    Spouse name: Not on file  . Number of children: 2  . Years of education: Not on file  . Highest education level: Not on file  Occupational History  . Occupation: bus Education administrator: RETIRED  Social Needs  . Financial resource strain: Not on file  . Food insecurity:    Worry: Not on file    Inability: Not on file  . Transportation needs:    Medical: Not on file    Non-medical: Not on file  Tobacco Use  . Smoking status: Former Smoker    Types: Cigarettes    Last attempt to quit: 12/14/1972    Years since quitting: 45.2  . Smokeless tobacco: Never Used  Substance and Sexual Activity  . Alcohol use: Yes    Comment: occasionally  . Drug use: No  . Sexual activity: Yes    Birth control/protection: None  Lifestyle  . Physical activity:    Days per week: Not on file    Minutes per session: Not on file  . Stress: Not on file  Relationships  . Social connections:    Talks on phone: Not on file    Gets together: Not on file    Attends religious service: Not on file    Active member of club or organization: Not on file    Attends meetings of clubs or organizations: Not on file    Relationship status: Not on  file  Other Topics Concern  . Not on file  Social History Narrative  . Not on file    Family History  Problem Relation Age of Onset  . Colon cancer Maternal Grandfather   . Kidney disease Brother   . Stroke Maternal Grandmother   . Diabetes Paternal Aunt        x 4 aunts     [name] See HPI Constitution: No fevers or chills No malaise No diaphoresis Skin: No rash or itching Eyes: no blurry vision, no double vision GU: no dysuria or  hematuria Neuro: no dizziness or headaches  all others reviewed and negative   Objective: Vitals:   02/24/18 0851  BP: 122/78  Pulse: 98  Temp: 98.4 F (36.9 C)  TempSrc: Oral  SpO2: 98%  Weight: 230 lb 6.4 oz (104.5 kg)  Height: 5' 4.57" (1.64 m)    Physical Exam  Constitutional: He is oriented to person, place, and time. He appears well-developed and well-nourished.  HENT:  Head: Normocephalic and atraumatic.  Eyes: Conjunctivae and EOM are normal.  Cardiovascular: Normal rate, regular rhythm and normal heart sounds.  No murmur heard. Occasional PVC  Pulmonary/Chest: Effort normal and breath sounds normal. No stridor. No respiratory distress.  Musculoskeletal: Normal range of motion. He exhibits no edema or tenderness.  Neurological: He is alert and oriented to person, place, and time.  Skin: Skin is warm. Capillary refill takes less than 2 seconds.  Psychiatric: He has a normal mood and affect. His behavior is normal. Judgment and thought content normal.      Component     Latest Ref Rng & Units 02/21/2018  Sodium     136 - 145 mmol/L 142  Potassium     3.5 - 5.1 mmol/L 3.8  Chloride     98 - 109 mmol/L 105  CO2     22 - 29 mmol/L 28  Glucose     70 - 140 mg/dL 163 (H)  BUN     7 - 26 mg/dL 17  Creatinine     0.70 - 1.30 mg/dL 1.01  Calcium     8.4 - 10.4 mg/dL 8.7  Total Protein     6.4 - 8.3 g/dL 6.5  Albumin     3.5 - 5.0 g/dL 3.2 (L)  AST     5 - 34 U/L 17  ALT     0 - 55 U/L 27    Alkaline Phosphatase     40 - 150 U/L 98  Total Bilirubin     0.2 - 1.2 mg/dL 0.3  GFR, Est Non African American     >60 mL/min >60  GFR, Est African American     >60 mL/min >60  Anion gap     3 - 11 9   Assessment and Plan Migel was seen today for hypertension.  Diagnoses and all orders for this visit:  Essential hypertension -  Refilled hctz today Reviewed cmp Labs are appropriate bp at goal  Marginal zone lymphoma [facility]) Reviewed notes from Oncology Pt will continue to follow up Reviewed and answered questions but advised him to direct questions to oncology Will need prevnar and pnemovax after finishing rituxan  Rheumatoid arthritis Has continued pain now that he is off mtx Now rituxan is not like humira or mtx for him He will follow up with Rheumatology  A total of 20 minutes were spent face-to-face with the patient during this encounter and over half of that time was spent on counseling and coordination of care.  [place]

## 2018-02-25 DIAGNOSIS — R52 Pain, unspecified: Secondary | ICD-10-CM | POA: Insufficient documentation

## 2018-02-26 ENCOUNTER — Ambulatory Visit (HOSPITAL_COMMUNITY)
Admission: RE | Admit: 2018-02-26 | Discharge: 2018-02-26 | Disposition: A | Discharge status: Home / Self Care | Payer: Medicare HMO | Source: Ambulatory Visit | Attending: Hematology | Admitting: Hematology

## 2018-02-26 DIAGNOSIS — R062 Wheezing: Secondary | ICD-10-CM

## 2018-02-26 DIAGNOSIS — J449 Chronic obstructive pulmonary disease, unspecified: Secondary | ICD-10-CM | POA: Diagnosis not present

## 2018-02-26 LAB — PULMONARY FUNCTION TEST
DL/VA % PRED: 96 %
DL/VA: 4.12 ml/min/mmHg/L
DLCO COR: 16.22 ml/min/mmHg
DLCO UNC % PRED: 59 %
DLCO cor % pred: 63 %
DLCO unc: 15.33 ml/min/mmHg
FEF 25-75 PRE: 1.53 L/s
FEF 25-75 Post: 1.94 L/sec
FEF2575-%CHANGE-POST: 27 %
FEF2575-%PRED-POST: 102 %
FEF2575-%Pred-Pre: 80 %
FEV1-%CHANGE-POST: 4 %
FEV1-%Pred-Post: 87 %
FEV1-%Pred-Pre: 83 %
FEV1-PRE: 1.88 L
FEV1-Post: 1.95 L
FEV1FVC-%CHANGE-POST: 2 %
FEV1FVC-%Pred-Pre: 101 %
FEV6-%CHANGE-POST: 2 %
FEV6-%PRED-PRE: 83 %
FEV6-%Pred-Post: 85 %
FEV6-Post: 2.46 L
FEV6-Pre: 2.41 L
FEV6FVC-%Change-Post: 1 %
FEV6FVC-%Pred-Post: 106 %
FEV6FVC-%Pred-Pre: 104 %
FVC-%Change-Post: 1 %
FVC-%PRED-POST: 80 %
FVC-%PRED-PRE: 79 %
FVC-POST: 2.47 L
FVC-PRE: 2.44 L
POST FEV1/FVC RATIO: 79 %
Post FEV6/FVC ratio: 100 %
Pre FEV1/FVC ratio: 77 %
Pre FEV6/FVC Ratio: 99 %
RV % PRED: 101 %
RV: 2.27 L
TLC % pred: 79 %
TLC: 4.82 L

## 2018-02-26 MED ORDER — ALBUTEROL SULFATE (2.5 MG/3ML) 0.083% IN NEBU
2.5000 mg | INHALATION_SOLUTION | Freq: Once | RESPIRATORY_TRACT | Status: AC
  Administered 2018-02-26: 2.5 mg via RESPIRATORY_TRACT

## 2018-02-28 ENCOUNTER — Inpatient Hospital Stay: Payer: Medicare HMO

## 2018-02-28 VITALS — BP 155/79 | HR 77 | Temp 98.5°F | Resp 17

## 2018-02-28 DIAGNOSIS — C8580 Other specified types of non-Hodgkin lymphoma, unspecified site: Secondary | ICD-10-CM

## 2018-02-28 DIAGNOSIS — Z5112 Encounter for antineoplastic immunotherapy: Secondary | ICD-10-CM | POA: Diagnosis not present

## 2018-02-28 LAB — CBC WITH DIFFERENTIAL (CANCER CENTER ONLY)
BASOS ABS: 0 10*3/uL (ref 0.0–0.1)
Basophils Relative: 0 %
EOS PCT: 1 %
Eosinophils Absolute: 0.1 10*3/uL (ref 0.0–0.5)
HCT: 42.4 % (ref 38.4–49.9)
Hemoglobin: 13.9 g/dL (ref 13.0–17.1)
LYMPHS ABS: 0.6 10*3/uL — AB (ref 0.9–3.3)
LYMPHS PCT: 8 %
MCH: 32.9 pg (ref 27.2–33.4)
MCHC: 32.8 g/dL (ref 32.0–36.0)
MCV: 100.5 fL — AB (ref 79.3–98.0)
MONO ABS: 0.3 10*3/uL (ref 0.1–0.9)
Monocytes Relative: 4 %
Neutro Abs: 6.9 10*3/uL — ABNORMAL HIGH (ref 1.5–6.5)
Neutrophils Relative %: 87 %
PLATELETS: 188 10*3/uL (ref 140–400)
RBC: 4.22 MIL/uL (ref 4.20–5.82)
RDW: 13.4 % (ref 11.0–14.6)
WBC: 7.9 10*3/uL (ref 4.0–10.3)

## 2018-02-28 LAB — CMP (CANCER CENTER ONLY)
ALK PHOS: 103 U/L (ref 40–150)
ALT: 23 U/L (ref 0–55)
ANION GAP: 10 (ref 3–11)
AST: 17 U/L (ref 5–34)
Albumin: 3.6 g/dL (ref 3.5–5.0)
BUN: 24 mg/dL (ref 7–26)
CALCIUM: 9.2 mg/dL (ref 8.4–10.4)
CO2: 30 mmol/L — AB (ref 22–29)
CREATININE: 1.43 mg/dL — AB (ref 0.70–1.30)
Chloride: 101 mmol/L (ref 98–109)
GFR, EST AFRICAN AMERICAN: 55 mL/min — AB (ref >60)
GFR, EST NON AFRICAN AMERICAN: 47 mL/min — AB (ref >60)
Glucose, Bld: 229 mg/dL — ABNORMAL HIGH (ref 70–140)
Potassium: 3.4 mmol/L — ABNORMAL LOW (ref 3.5–5.1)
Sodium: 141 mmol/L (ref 136–145)
Total Bilirubin: 0.4 mg/dL (ref 0.2–1.2)
Total Protein: 7 g/dL (ref 6.4–8.3)

## 2018-02-28 LAB — RETICULOCYTES
RBC.: 4.22 MIL/uL (ref 4.20–5.82)
Retic Count, Absolute: 50.6 10*3/uL (ref 34.8–93.9)
Retic Ct Pct: 1.2 % (ref 0.8–1.8)

## 2018-02-28 MED ORDER — ALBUTEROL SULFATE (2.5 MG/3ML) 0.083% IN NEBU
INHALATION_SOLUTION | RESPIRATORY_TRACT | Status: AC
  Filled 2018-02-28: qty 3

## 2018-02-28 MED ORDER — DIPHENHYDRAMINE HCL 25 MG PO CAPS
ORAL_CAPSULE | ORAL | Status: AC
Start: 2018-02-28 — End: ?
  Filled 2018-02-28: qty 2

## 2018-02-28 MED ORDER — METHYLPREDNISOLONE SODIUM SUCC 125 MG IJ SOLR
INTRAMUSCULAR | Status: AC
  Filled 2018-02-28: qty 2

## 2018-02-28 MED ORDER — SODIUM CHLORIDE 0.9 % IV SOLN
Freq: Once | INTRAVENOUS | Status: AC
  Administered 2018-02-28: 13:00:00 via INTRAVENOUS

## 2018-02-28 MED ORDER — FAMOTIDINE IN NACL 20-0.9 MG/50ML-% IV SOLN
INTRAVENOUS | Status: AC
  Filled 2018-02-28: qty 50

## 2018-02-28 MED ORDER — METHYLPREDNISOLONE SODIUM SUCC 125 MG IJ SOLR
125.0000 mg | Freq: Once | INTRAMUSCULAR | Status: AC
  Administered 2018-02-28: 125 mg via INTRAVENOUS

## 2018-02-28 MED ORDER — ACETAMINOPHEN 325 MG PO TABS
ORAL_TABLET | ORAL | Status: AC
Start: 2018-02-28 — End: ?
  Filled 2018-02-28: qty 2

## 2018-02-28 MED ORDER — ALBUTEROL SULFATE (2.5 MG/3ML) 0.083% IN NEBU
2.5000 mg | INHALATION_SOLUTION | Freq: Once | RESPIRATORY_TRACT | Status: DC
  Filled 2018-02-28: qty 3

## 2018-02-28 MED ORDER — DIPHENHYDRAMINE HCL 25 MG PO CAPS
50.0000 mg | ORAL_CAPSULE | Freq: Once | ORAL | Status: AC
  Administered 2018-02-28: 50 mg via ORAL

## 2018-02-28 MED ORDER — SODIUM CHLORIDE 0.9 % IV SOLN
375.0000 mg/m2 | Freq: Once | INTRAVENOUS | Status: AC
  Administered 2018-02-28: 800 mg via INTRAVENOUS
  Filled 2018-02-28: qty 50

## 2018-02-28 MED ORDER — ACETAMINOPHEN 325 MG PO TABS
650.0000 mg | ORAL_TABLET | Freq: Once | ORAL | Status: AC
  Administered 2018-02-28: 650 mg via ORAL

## 2018-02-28 MED ORDER — FAMOTIDINE IN NACL 20-0.9 MG/50ML-% IV SOLN
20.0000 mg | Freq: Once | INTRAVENOUS | Status: AC
Start: 2018-02-28 — End: 2018-02-28
  Administered 2018-02-28: 20 mg via INTRAVENOUS

## 2018-02-28 NOTE — Patient Instructions (Signed)
[facility] Discharge Instructions for Patients Receiving Chemotherapy  Today you received the following chemotherapy agents:  Rituxan.  To help prevent nausea and vomiting after your treatment, we encourage you to take your nausea medication as directed.   If you develop nausea and vomiting that is not controlled by your nausea medication, call the clinic.   BELOW ARE SYMPTOMS THAT SHOULD BE REPORTED IMMEDIATELY:  *FEVER GREATER THAN 100.5 F  *CHILLS WITH OR WITHOUT FEVER  NAUSEA AND VOMITING THAT IS NOT CONTROLLED WITH YOUR NAUSEA MEDICATION  *UNUSUAL SHORTNESS OF BREATH  *UNUSUAL BRUISING OR BLEEDING  TENDERNESS IN MOUTH AND THROAT WITH OR WITHOUT PRESENCE OF ULCERS  *URINARY PROBLEMS  *BOWEL PROBLEMS  UNUSUAL RASH Items with * indicate a potential emergency and should be followed up as soon as possible.  Feel free to call the clinic should you have any questions or concerns. The clinic phone number is [phone].  Please show the CHEMO ALERT CARD at check-in to the Emergency Department and triage nurse.   

## 2018-03-08 ENCOUNTER — Other Ambulatory Visit: Payer: Self-pay | Admitting: Family Medicine

## 2018-03-10 NOTE — Telephone Encounter (Signed)
gabapentin refill Last OV: 08/12/17 Last Refill:01/28/18 #90 caps no RF Pharmacy:[facility] [address] Delia Chimes MD

## 2018-03-17 ENCOUNTER — Other Ambulatory Visit: Payer: Self-pay | Admitting: *Deleted

## 2018-03-17 DIAGNOSIS — C8580 Other specified types of non-Hodgkin lymphoma, unspecified site: Secondary | ICD-10-CM

## 2018-03-18 ENCOUNTER — Inpatient Hospital Stay: Payer: Medicare HMO

## 2018-03-18 ENCOUNTER — Inpatient Hospital Stay: Payer: Medicare HMO | Attending: Hematology | Admitting: Hematology

## 2018-03-18 ENCOUNTER — Telehealth: Payer: Self-pay

## 2018-03-18 ENCOUNTER — Encounter: Payer: Self-pay | Admitting: Hematology

## 2018-03-18 VITALS — BP 130/72 | HR 78 | Temp 98.5°F | Resp 18 | Ht 64.57 in | Wt 229.9 lb

## 2018-03-18 DIAGNOSIS — C884 Extranodal marginal zone B-cell lymphoma of mucosa-associated lymphoid tissue [MALT-lymphoma]: Secondary | ICD-10-CM | POA: Diagnosis not present

## 2018-03-18 DIAGNOSIS — M069 Rheumatoid arthritis, unspecified: Secondary | ICD-10-CM

## 2018-03-18 DIAGNOSIS — C8580 Other specified types of non-Hodgkin lymphoma, unspecified site: Secondary | ICD-10-CM

## 2018-03-18 DIAGNOSIS — D649 Anemia, unspecified: Secondary | ICD-10-CM | POA: Diagnosis not present

## 2018-03-18 DIAGNOSIS — E538 Deficiency of other specified B group vitamins: Secondary | ICD-10-CM

## 2018-03-18 DIAGNOSIS — Z87891 Personal history of nicotine dependence: Secondary | ICD-10-CM

## 2018-03-18 LAB — CMP (CANCER CENTER ONLY)
ALT: 20 U/L (ref 0–55)
AST: 17 U/L (ref 5–34)
Albumin: 3.6 g/dL (ref 3.5–5.0)
Alkaline Phosphatase: 104 U/L (ref 40–150)
Anion gap: 8 (ref 3–11)
BILIRUBIN TOTAL: 0.3 mg/dL (ref 0.2–1.2)
BUN: 20 mg/dL (ref 7–26)
CO2: 31 mmol/L — ABNORMAL HIGH (ref 22–29)
CREATININE: 1.34 mg/dL — AB (ref 0.70–1.30)
Calcium: 9 mg/dL (ref 8.4–10.4)
Chloride: 102 mmol/L (ref 98–109)
GFR, EST AFRICAN AMERICAN: 59 mL/min — AB (ref >60)
GFR, EST NON AFRICAN AMERICAN: 51 mL/min — AB (ref >60)
Glucose, Bld: 187 mg/dL — ABNORMAL HIGH (ref 70–140)
Potassium: 3.6 mmol/L (ref 3.5–5.1)
Sodium: 141 mmol/L (ref 136–145)
TOTAL PROTEIN: 6.8 g/dL (ref 6.4–8.3)

## 2018-03-18 LAB — CBC WITH DIFFERENTIAL (CANCER CENTER ONLY)
BASOS ABS: 0 10*3/uL (ref 0.0–0.1)
Basophils Relative: 0 %
EOS ABS: 0.1 10*3/uL (ref 0.0–0.5)
EOS PCT: 1 %
HCT: 41.9 % (ref 38.4–49.9)
Hemoglobin: 13.7 g/dL (ref 13.0–17.1)
Lymphocytes Relative: 9 %
Lymphs Abs: 0.7 10*3/uL — ABNORMAL LOW (ref 0.9–3.3)
MCH: 32.8 pg (ref 27.2–33.4)
MCHC: 32.7 g/dL (ref 32.0–36.0)
MCV: 100.2 fL — ABNORMAL HIGH (ref 79.3–98.0)
Monocytes Absolute: 0.4 10*3/uL (ref 0.1–0.9)
Monocytes Relative: 5 %
NEUTROS PCT: 85 %
Neutro Abs: 6.3 10*3/uL (ref 1.5–6.5)
PLATELETS: 195 10*3/uL (ref 140–400)
RBC: 4.18 MIL/uL — AB (ref 4.20–5.82)
RDW: 13.2 % (ref 11.0–14.6)
WBC: 7.4 10*3/uL (ref 4.0–10.3)

## 2018-03-18 LAB — RETICULOCYTES
RBC.: 4.18 MIL/uL — AB (ref 4.20–5.82)
Retic Count, Absolute: 58.5 10*3/uL (ref 34.8–93.9)
Retic Ct Pct: 1.4 % (ref 0.8–1.8)

## 2018-03-18 NOTE — Telephone Encounter (Signed)
Printed avs and calender of upcoming appointment. Per 5/7 los also gave patient ct number

## 2018-03-18 NOTE — Progress Notes (Signed)
HEMATOLOGY ONCOLOGY CLINIC NOTE  Date of Service: .03/18/2018   Patient Care Team: Shawnee Knapp, MD as PCP - General (Family Medicine) [name] as Physician Assistant (Emergency Medicine) [name], MD as Consulting Physician (Orthopedic Surgery) [name], MD as Consulting Physician (Oncology) Helayne Seminole, MD as Consulting Physician (Otolaryngology) Edrick Kins, DPM as Consulting Physician (Podiatry)  CHIEF COMPLAINTS F/u for  Marginal Zone lymphoma   HISTORY OF PRESENTING ILLNESS:   Bryan [name] 73 y.o. male is here because of a referral from ENT [name] regarding a concern of MALT lymphoma.   He is accompanied today by 4 members of his family. The pt reports that he is doing well overall. The pt's PCP is [name]. The pt sees [name] for his Rheumatology. The pt went off of humira a month before his mass appeared two months. The pt takes Methotrexate 15 mg once each week for his rheumatoid arthritis. He notes that he took humira for 8-10 months, and hasn't taken it in about 3 months. He began taking prednisone 15mg  q day, 2 months ago.   He reports having high blood pressure and takes amlodipine, metoprolol for it. He reports having had back and neck surgery for discs.  He also notes having essential tremors in his hands for which he takes primidone.  He reports having run out of flomax. He has had some PTSD from his time in the [place] war.  He notes he has had drenching night sweats for the last 15 years that are of intermittent frequency. He denies any other medical issues.    He first noticed swelling of his left cheek about two months ago. He notes that it was "hard like a rock." He was placed on prednisone for about a month after going to the doctor, with some relief. He then had his needle Bx recorded below.   He reports gum pain that began this morning, and also reports that his tongue feels as if it has  bumps on it. He reports taking folic acid 1mg  each day.    On 10/29/17 the pt had a neck CT revealing Multifocal left parotid lesion. Multiple ill-defined enhancing nodules in the left parotid gland. Asymmetric enhancing lymph nodes in the left neck are not pathologically enlarged however given the asymmetry and the left parotid lesion, they could represent neoplastic spread of parotid malignancy or lymphoma. Tissue sampling recommended.  Of note prior to the patient's visit, pt has had a biopsy of his left parotid gland completed on 12/03/17 with results revealing Atypical lymphoid proliferation. 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 -   B cell clonality study was positive for clonality.   On review of systems, pt reports gum pain, fatigue (not recent), occasional night sweats, ankle pain, and denies abdominal pains, back pain, flank pain, leg swelling, swollen or painful joints beside ankles.   On PMHx the pt has had rheumatoid arthritis for 6 years and HTN. He notes that he has anemia and is a Acupuncturist Witness I- which he notes means he would not want to consider blood products  On Surgical Hx the pt had back surgery. On Social Hx the pt quit smoking in 1976 after smoking about 8 cigarettes each day for 15 years. He notes drinking ETOH about once a week. He denies chemical or radiation exposure,  except for agent orange.  On Family Hx the pt notes high blood pressure, DM, but denies autoimmune conditions, cancers, or blood disorders.   INTERVAL HISTORY:   Bryan [name] is here for a scheduled follow-up of his marginal zone lymphoma and for a toxicity check post Rituxan treatment. He presents to the clinic today accompanied by his wife. He notes he is on 20mg  steroids currently. He stopped aspirin before starting treatment and wonders can he restart it and  we noted that there is no contraindication from our standpoint and that he discuss this need with his PCP.  On review of symptoms, pt note lower back pain in the morning and he takes prednisone and this will eventually relieve the pain. He will walk with his back hunched due to the pain. He is not doing any PT. The pain is exacerbated by standing for long periods of time.   REVIEW OF SYSTEMS:   .10 Point review of Systems was done is negative except as noted above.   MEDICAL HISTORY:  Past Medical History:  Diagnosis Date  . Anemia   . Arthritis    hands, knees, cervical area. Back pain. Rheumatoid arthritis- weekly injections.  [name] BPH (benign prostatic hypertrophy)   . Essential tremor   . GERD (gastroesophageal reflux disease)    reports for indigestion he uses mustard   . Hearing deficit    wears hearing aids bilateral  . HTN (hypertension)   . Obesity   . Refusal of blood transfusions as patient is Jehovah's Witness   . Right bundle branch block    history of  . Seizures (HCC)    AS A CHILD. only esential tremors now.  . Sleep apnea    cpap - settings at 9 per patient   . Ulcer   JEHOVA's WITNESS  SURGICAL HISTORY: Past Surgical History:  Procedure Laterality Date  . ANTERIOR CERVICAL DECOMP/DISCECTOMY FUSION N/A 03/30/2013   Procedure: ANTERIOR CERVICAL DECOMPRESSION/DISCECTOMY FUSION 2 LEVELS;  Surgeon: Otilio Connors, MD;  Location: [facility];  Service: Neurosurgery;  Laterality: N/A;  C4-5 C5-6 Anterior cervical decompression/diskectomy/fusion/Allograft/Plate  . CHOLECYSTECTOMY N/A 12/07/2013   Procedure: LAPAROSCOPIC CHOLECYSTECTOMY WITH INTRAOPERATIVE CHOLANGIOGRAM;  Surgeon: Adin Hector, MD;  Location: [facility];  Service: General;  Laterality: N/A;  . COLONOSCOPY    . DECOMPRESSIVE LUMBAR LAMINECTOMY LEVEL 2 N/A 02/15/2015   Procedure: COMPLETE DECOMPRESSIVE LUMBAR LAMINECTOMY L4-L5/ FORAMINOTOMY TO L4 NERVE ROOT AND L5 NERVE ROOT BILATERALLY;  Surgeon: [name], MD;  Location: [facility];  Service: Orthopedics;  Laterality: N/A;  . EYE SURGERY     right, growth excision  . LUMBAR LAMINECTOMY/DECOMPRESSION MICRODISCECTOMY Left 01/18/2016   Procedure:  DECOMPRESSION L4-L5 MICRODISCECTOMY L4-L5 ON LEFT FOR SPINAL STENOSIS;  Surgeon: [name], MD;  Location: [facility];  Service: Orthopedics;  Laterality: Left;  . PAROTIDECTOMY Left 01/24/2018   Procedure: INCISIONAL BIOPSY OF LEFT PAROTID    MASS;  Surgeon: Helayne Seminole, MD;  Location: [facility];  Service: ENT;  Laterality: Left;  . SPINE SURGERY    . TONSILLECTOMY    . VASECTOMY    . WRIST GANGLION EXCISION Left     SOCIAL HISTORY: Social History   Socioeconomic History  . Marital status: Married    Spouse name: Not on file  . Number of children: 2  . Years of education: Not on file  . Highest education level: Not on file  Occupational History  . Occupation: bus Education administrator: RETIRED  Social Needs  . Financial resource strain: Not on file  . Food insecurity:    Worry: Not on file    Inability: Not on file  . Transportation needs:    Medical: Not on file    Non-medical: Not on file  Tobacco Use  . Smoking status: Former Smoker    Types: Cigarettes    Last attempt to quit: 12/14/1972    Years since quitting: 45.2  . Smokeless tobacco: Never Used  Substance and Sexual Activity  . Alcohol use: Yes    Comment: occasionally  . Drug use: No  . Sexual activity: Yes    Birth control/protection: None  Lifestyle  . Physical activity:    Days per week: Not on file    Minutes per session: Not on file  . Stress: Not on file  Relationships  . Social connections:    Talks on phone: Not on file    Gets together: Not on file    Attends religious service: Not on file    Active member of club or organization: Not on file    Attends meetings of clubs or organizations: Not on file    Relationship status: Not on file  . Intimate partner violence:    Fear of current or ex  partner: Not on file    Emotionally abused: Not on file    Physically abused: Not on file    Forced sexual activity: Not on file  Other Topics Concern  . Not on file  Social History Narrative  . Not on file    FAMILY HISTORY: Family History  Problem Relation Age of Onset  . Colon cancer Maternal Grandfather   . Kidney disease Brother   . Stroke Maternal Grandmother   . Diabetes Paternal Aunt        x 4 aunts    ALLERGIES:  is allergic to lisinopril and other.  MEDICATIONS:  Current Outpatient Medications  Medication Sig Dispense Refill  . aspirin EC 81 MG tablet Take 81 mg by mouth daily.    [name] gabapentin (NEURONTIN) 300 MG capsule TAKE 1 CAPSULE BY MOUTH THREE TIMES DAILY 90 capsule 0  . hydrochlorothiazide (HYDRODIURIL) 25 MG tablet TAKE 25 MG BY MOUTH ONCE DAILY 90 tablet 3  . metoprolol succinate (TOPROL-XL) 50 MG 24 hr tablet TAKE 1 TABLET BY MOUTH ONCE DAILY. TAKE WITH OR IMMEDIATELY FOLLOWING A MEAL (Patient taking differently: TAKE 50 MG BY MOUTH ONCE DAILY. TAKE WITH OR IMMEDIATELY FOLLOWING A MEAL) 90 tablet 3  . primidone (MYSOLINE) 50 MG tablet Take 50 mg by mouth 2 (two) times daily.     . ranitidine (ZANTAC) 150 MG tablet Take 1 tablet (150 mg total) by mouth 2 (two) times daily. 60 tablet 0  . Tamsulosin HCl (FLOMAX) 0.4 MG CAPS Take 0.4 mg by mouth at bedtime.     . traMADol (ULTRAM) 50 MG tablet Take 1 tablet (50 mg total) every 8 (eight) hours as needed by mouth for severe pain. 30 tablet 0   No current facility-administered medications for this visit.     PHYSICAL EXAMINATION: ECOG PERFORMANCE STATUS: 1 - Symptomatic but completely ambulatory  Vitals:   03/18/18 0909  BP: 130/72  Pulse: 78  Resp: 18  Temp: 98.5 F (36.9 C)  SpO2: 96%   Filed Weights   03/18/18 0909  Weight: 229 lb 14.4 oz (104.3 kg)   . GENERAL:alert, in no acute distress and comfortable SKIN: no acute rashes, no significant lesions EYES: conjunctiva are  pink and non-injected,  sclera anicteric OROPHARYNX: MMM, no exudates, no oropharyngeal erythema or ulceration NECK: supple, no JVD LYMPH:  no palpable lymphadenopathy in the cervical, axillary or inguinal regions LUNGS: clear to auscultation b/l with normal respiratory effort HEART: regular rate & rhythm ABDOMEN:  normoactive bowel sounds , non tender, not distended. Extremity: no pedal edema PSYCH: alert & oriented x 3 with fluent speech NEURO: no focal motor/sensory deficits   LABS CBC Latest Ref Rng & Units 03/18/2018 02/28/2018 02/21/2018  WBC 4.0 - 10.3 K/uL 7.4 7.9 7.4  Hemoglobin 13.0 - 17.1 g/dL 13.7 13.9 12.8(L)  Hematocrit 38.4 - 49.9 % 41.9 42.4 39.9  Platelets 140 - 400 K/uL 195 188 168   CBC    Component Value Date/Time   WBC 7.4 03/18/2018 0848   WBC 7.4 02/21/2018 1106   RBC 4.18 (L) 03/18/2018 0848   RBC 4.18 (L) 03/18/2018 0848   HGB 13.7 03/18/2018 0848   HCT 41.9 03/18/2018 0848   HCT 42.7 12/19/2017 1416   PLT 195 03/18/2018 0848   MCV 100.2 (H) 03/18/2018 0848   MCV 96.7 06/10/2017 1540   MCH 32.8 03/18/2018 0848   MCHC 32.7 03/18/2018 0848   RDW 13.2 03/18/2018 0848   LYMPHSABS 0.7 (L) 03/18/2018 0848   MONOABS 0.4 03/18/2018 0848   EOSABS 0.1 03/18/2018 0848   BASOSABS 0.0 03/18/2018 0848    CMP Latest Ref Rng & Units 03/18/2018 02/28/2018 02/21/2018  Glucose 70 - 140 mg/dL 187(H) 229(H) 163(H)  BUN 7 - 26 mg/dL 20 24 17   Creatinine 0.70 - 1.30 mg/dL 1.34(H) 1.43(H) 1.01  Sodium 136 - 145 mmol/L 141 141 142  Potassium 3.5 - 5.1 mmol/L 3.6 3.4(L) 3.8  Chloride 98 - 109 mmol/L 102 101 105  CO2 22 - 29 mmol/L 31(H) 30(H) 28  Calcium 8.4 - 10.4 mg/dL 9.0 9.2 8.7  Total Protein 6.4 - 8.3 g/dL 6.8 7.0 6.5  Total Bilirubin 0.2 - 1.2 mg/dL 0.3 0.4 0.3  Alkaline Phos 40 - 150 U/L 104 103 98  AST 5 - 34 U/L 17 17 17   ALT 0 - 55 U/L 20 23 27       Lab Results  Component Value Date   LDH 215 02/07/2018    Component     Latest Ref Rng & Units 12/19/2017  Iron     42 - 163  ug/dL 130  TIBC     202 - 409 ug/dL 331  Saturation Ratios     42 - 163 % 39 (L)  UIBC     ug/dL 201  Folate, Hemolysate     Not Estab. ng/mL 565.3  HCT     37.5 - 51.0 % 42.7  Folate, RBC     >498 ng/mL 1,324  Vitamin B12     180 - 914 pg/mL 264  Ferritin     22 - 316 ng/mL 42  Hepatitis B Surface Ag     Negative Negative  Hep B Core Ab, Tot     Negative Negative  HCV Ab     0.0 - 0.9 s/co ratio <0.1   LABORATORY DATA:    01/27/18 Flow Cytometry:   01/27/18 Parotid Gland Surgical Pathology   RADIOGRAPHIC STUDIES: I have personally reviewed the radiological images as listed and agreed with the findings in the report. No results found.  ASSESSMENT & PLAN:   72 y.o. is a  male with    1. Newly diagnosed Stage IIE Marginal-zone lymphoma involving the left  parotid gland, left cervical LN and mediastinal lymph nodes. . Lab Results  Component Value Date   LDH 215 02/07/2018   Hep C neg and Hep B -He completed 4 dose Rituxan 02/07/18-02/28/18.  LDH has normalized and the Parotid mass is no longer palpable.  2. Jehovas Witness- declines any use of blood products.  PLAN  -We reviewed his labs, overall stable.  -No prohibitive toxicities from Rituxan, pt tolerated treatment well.  -No palpable lymph nodes upon today's physical exam.  -To monitor response to Rituxan will do PET/CT scan in 2 months.  -Continue prednisone taper as per instructions.  -EPO and other non-blood products would be considered if his RBC numbers drop, as opposed to PRBC since the pt notes he is opposed to blood transfusions given that he is a Jehovah's Witness. No indication at this time.    2. Rheumatoid Arthritis Sed rate has normalized. Hopefully his Rituxan might help his RA as well. - off  MTX and is currently on prednisone per his rheumatologist, Dr. Amil Amen and PA Leafy Kindle.  -Rituxan might help with some control of his RA. Other treatments based on symptoms post-Rituxan. -His  current lower back stiffness and pain does not last beyond 1.5 hours.  -I recommend he continue to taper prednisone. He is currently on 20mg  prednisone. I will refill him at 10mg  to start this week (03/18/18) and continue for a month.  -He will follow up with Rheumatologist to manage taper.  -I encouraged him to be more active with at least walking and possibly start PT or Silver Sneakers program before considering any stronger medication. He agreed.  -To help his inflammation I recommend intermittent fasting and change in diet.    3. Low B12 levels -continue B12 SL 1023mcg SL daily  4. Wheezing - resolved currently PFTs 02/26/2018 - Pulmonary Function Diagnosis: Minimal Obstructive Airways Disease Minimal Restriction - Moderate Diffusion Defect PLAN -f/u with PCP to mx this. -will defer to rheumatology for evaluation/management of possible RA associated early ILD -recommended patient focus on increased physical activity and diet to try to achieve body weight.  Refilled 10mg  Prednisone for taper  PET/CT in 5 weeks RTC with Dr Irene Limbo in 6 weeks with labs  continue f/u with rheumatology and PCP  All of the patient's questions were answered with apparent satisfaction. The patient knows to call the clinic with any problems, questions or concerns.  . The total time spent in the appointment was 25 minutes and more than 50% was on counseling and direct patient cares.   Sullivan Lone MD [place] AAHIVMS [facility] Hematology/Oncology Physician [facility]  (Office):       [phone] (Work cell):  [phone] (Fax):           [phone]  This document serves as a record of services personally performed by Sullivan Lone, MD. It was created on his behalf by Joslyn Devon, a trained medical scribe. The creation of this record is based on the scribe's personal observations and the provider's statements to them.    .I have reviewed the above documentation for accuracy and completeness, and  I agree with the above. [name] MD MS

## 2018-03-18 NOTE — Patient Instructions (Signed)
Thank you for choosing [facility] to provide your oncology and hematology care.  To afford each patient quality time with our providers, please arrive 30 minutes before your scheduled appointment time.  If you arrive late for your appointment, you may be asked to reschedule.  We strive to give you quality time with our providers, and arriving late affects you and other patients whose appointments are after yours.   If you are a no show for multiple scheduled visits, you may be dismissed from the clinic at the providers discretion.    Again, thank you for choosing [facility], our hope is that these requests will decrease the amount of time that you wait before being seen by our physicians.  ______________________________________________________________________  Should you have questions after your visit to the [facility], please contact our office at [phone] between the hours of 8:30 and 4:30 p.m.    Voicemails left after 4:30p.m will not be returned until the following business day.    For prescription refill requests, please have your pharmacy contact us directly.  Please also try to allow 48 hours for prescription requests.    Please contact the scheduling department for questions regarding scheduling.  For scheduling of procedures such as PET scans, CT scans, MRI, Ultrasound, etc please contact central scheduling at [phone].    Resources For Cancer Patients and Caregivers:   Oncolink.org:  A wonderful resource for patients and healthcare providers for information regarding your disease, ways to tract your treatment, what to expect, etc.     American Cancer Society:  800-227-2345  Can help patients locate various types of support and financial assistance  Cancer Care: 1-800-813-HOPE (4673) Provides financial assistance, online support groups, medication/co-pay assistance.    [place] DSS:  336-641-3447 Where to apply for food  stamps, Medicaid, and utility assistance  Medicare Rights Center: 800-333-4114 Helps people with Medicare understand their rights and benefits, navigate the Medicare system, and secure the quality healthcare they deserve  SCAT: 336-333-6589 [place] [employer]'s shared-ride transportation service for eligible riders who have a disability that prevents them from riding the fixed route bus.    For additional information on assistance programs please contact our social worker:   Grier Hock/Abigail Elmore:  [phone]            

## 2018-03-20 ENCOUNTER — Telehealth: Payer: Self-pay | Admitting: Hematology

## 2018-03-20 NOTE — Telephone Encounter (Signed)
Faxed medical records to [facility]. @ [phone] with Medication Management. Release ID# [id number]

## 2018-04-03 DIAGNOSIS — M0579 Rheumatoid arthritis with rheumatoid factor of multiple sites without organ or systems involvement: Secondary | ICD-10-CM | POA: Diagnosis not present

## 2018-04-03 DIAGNOSIS — I7300 Raynaud's syndrome without gangrene: Secondary | ICD-10-CM | POA: Diagnosis not present

## 2018-04-03 DIAGNOSIS — Z6839 Body mass index (BMI) 39.0-39.9, adult: Secondary | ICD-10-CM | POA: Diagnosis not present

## 2018-04-03 DIAGNOSIS — M545 Low back pain: Secondary | ICD-10-CM | POA: Diagnosis not present

## 2018-04-03 DIAGNOSIS — R591 Generalized enlarged lymph nodes: Secondary | ICD-10-CM | POA: Diagnosis not present

## 2018-04-03 DIAGNOSIS — E669 Obesity, unspecified: Secondary | ICD-10-CM | POA: Diagnosis not present

## 2018-04-03 DIAGNOSIS — M2550 Pain in unspecified joint: Secondary | ICD-10-CM | POA: Diagnosis not present

## 2018-04-03 DIAGNOSIS — G8929 Other chronic pain: Secondary | ICD-10-CM | POA: Diagnosis not present

## 2018-04-11 DIAGNOSIS — I499 Cardiac arrhythmia, unspecified: Secondary | ICD-10-CM | POA: Diagnosis not present

## 2018-04-11 DIAGNOSIS — N400 Enlarged prostate without lower urinary tract symptoms: Secondary | ICD-10-CM | POA: Diagnosis not present

## 2018-04-11 DIAGNOSIS — Z7982 Long term (current) use of aspirin: Secondary | ICD-10-CM | POA: Diagnosis not present

## 2018-04-11 DIAGNOSIS — Z6838 Body mass index (BMI) 38.0-38.9, adult: Secondary | ICD-10-CM | POA: Diagnosis not present

## 2018-04-11 DIAGNOSIS — G8929 Other chronic pain: Secondary | ICD-10-CM | POA: Diagnosis not present

## 2018-04-11 DIAGNOSIS — I10 Essential (primary) hypertension: Secondary | ICD-10-CM | POA: Diagnosis not present

## 2018-04-11 DIAGNOSIS — Z79891 Long term (current) use of opiate analgesic: Secondary | ICD-10-CM | POA: Diagnosis not present

## 2018-04-11 DIAGNOSIS — G250 Essential tremor: Secondary | ICD-10-CM | POA: Diagnosis not present

## 2018-04-11 DIAGNOSIS — C8590 Non-Hodgkin lymphoma, unspecified, unspecified site: Secondary | ICD-10-CM | POA: Diagnosis not present

## 2018-04-14 ENCOUNTER — Other Ambulatory Visit: Payer: Self-pay | Admitting: Family Medicine

## 2018-04-14 NOTE — Telephone Encounter (Signed)
Refill request for gabapentin 300 mg #90 with no refills approved.  Last seen 02/24/2018. Dgaddy, CMA

## 2018-04-17 ENCOUNTER — Other Ambulatory Visit: Payer: Self-pay | Admitting: Family Medicine

## 2018-04-17 NOTE — Telephone Encounter (Signed)
LOV  02/24/18 [name]

## 2018-04-17 NOTE — Telephone Encounter (Signed)
Copied from [place]. Topic: Quick Communication - See Telephone Encounter >> Apr 17, 2018  8:32 AM Keene Breath wrote: CRM for notification. See Telephone encounter for: 04/17/18.  Barnabas Lister from [facility] call requesting refill for:  gabapentin (NEURONTIN) 300 MG capsule, hydrochlorothiazide (HYDRODIURIL) 25 MG tablet, metoprolol succinate (TOPROL-XL) 50 MG 24 hr tablet, CB [phone]

## 2018-04-18 ENCOUNTER — Ambulatory Visit: Payer: Medicare HMO | Admitting: Physician Assistant

## 2018-04-18 ENCOUNTER — Other Ambulatory Visit: Payer: Self-pay

## 2018-04-18 ENCOUNTER — Encounter: Payer: Self-pay | Admitting: Physician Assistant

## 2018-04-18 VITALS — BP 124/64 | HR 93 | Temp 99.6°F | Resp 18 | Ht 64.76 in | Wt 229.4 lb

## 2018-04-18 DIAGNOSIS — R1033 Periumbilical pain: Secondary | ICD-10-CM | POA: Diagnosis not present

## 2018-04-18 DIAGNOSIS — R1011 Right upper quadrant pain: Secondary | ICD-10-CM

## 2018-04-18 NOTE — Patient Instructions (Addendum)
Contact Dr. Charlestine Night office to get the MRI and myelogram he ordered in 2017.  Take the primidone, tramadol and hydrocodone WITH FOOD. If that doesn't help, we may need to restart the ranitidine (Zantac), and may also need a GI specialist to evaluate you.     IF you received an x-ray today, you will receive an invoice from [facility]. Please contact [facility] at [phone] with questions or concerns regarding your invoice.   IF you received labwork today, you will receive an invoice from [place]. Please contact [facility] at [phone] with questions or concerns regarding your invoice.   Our billing staff will not be able to assist you with questions regarding bills from these companies.  You will be contacted with the lab results as soon as they are available. The fastest way to get your results is to activate your My Chart account. Instructions are located on the last page of this paperwork. If you have not heard from [place] regarding the results in 2 weeks, please contact this office.

## 2018-04-18 NOTE — Progress Notes (Signed)
Patient ID: Bryan [name], male    DOB: 01-Mar-1946, 72 y.o.   MRN: [id number]  PCP: Shawnee Knapp, MD  Chief Complaint  Patient presents with  . Fatigue    X 2 days  . Fever    X 2 days off and on    Subjective:   Presents for evaluation of generalized malaise and subjective fever.  I last saw him 09/2017 for back pain. In the meantime he has been diagnosed with marginal zone lymphoma.  His next appointment with oncology is in 4 days.  "I feel terrible.  I think it is my blood.  It does not give me any energy.  Its poor blood." Reports that he is anemic. Hgb 13.7 g/dL on 5/07  "Something in my stomach is out of place. Like a tear." Not worse when he eats. Can feel it when he breathes really hard. No urinary or bowel changes. No SOB, CP. Feels dizzy "all the time," which he associates with age and medications. Describes this as a lack of good balance. No falls, but several near falls. Knows that he has to take his time. "I think young, but I walk according to my age."  New small mole on the radial aspect of the RIGHT thumb.   Review of Systems As above.    Patient Active Problem List   Diagnosis Date Noted  . Marginal zone lymphoma ([place]) 02/02/2018  . Lymphoma ([place]) 01/24/2018  . Parotid mass 10/28/2017  . Arthralgia of right lower leg 10/10/2017  . Class 2 severe obesity due to excess calories with serious comorbidity and body mass index (BMI) of 39.0 to 39.9 in adult ([place]) 03/07/2017  . ACE-inhibitor cough 03/07/2017  . Lumbago 06/21/2016  . Spinal stenosis, lumbar region, with neurogenic claudication 02/15/2015  . Obesity (BMI 30-39.9) 12/07/2013  . Refusal of blood transfusions as patient is Jehovah's Witness 12/07/2013  . Rheumatoid arthritis ([place]) 10/26/2012  . Nonspecific abnormal finding in stool contents 07/28/2012  . Multiple joint pain 06/05/2012  . History of tobacco use-  06/05/2012  . Asthmatic bronchitis 12/15/2011  . BPH (benign prostatic  hyperplasia) 12/15/2011  . Essential tremor 12/15/2011  . Hypertension 12/15/2011     Prior to Admission medications   Medication Sig Start Date End Date Taking? Authorizing Provider  aspirin EC 81 MG tablet Take 81 mg by mouth daily.   Yes [provider]  gabapentin (NEURONTIN) 300 MG capsule TAKE 1 CAPSULE BY MOUTH THREE TIMES DAILY 04/14/18  Yes Stallings, Zoe A, MD  hydrochlorothiazide (HYDRODIURIL) 25 MG tablet TAKE 25 MG BY MOUTH ONCE DAILY 02/24/18  Yes Stallings, Zoe A, MD  HYDROcodone-acetaminophen (NORCO/VICODIN) 5-325 MG tablet Take 1 tablet by mouth every 8 (eight) hours as needed for moderate pain.   Yes [provider]  metoprolol succinate (TOPROL-XL) 50 MG 24 hr tablet TAKE 1 TABLET BY MOUTH ONCE DAILY. TAKE WITH OR IMMEDIATELY FOLLOWING A MEAL Patient taking differently: TAKE 50 MG BY MOUTH ONCE DAILY. TAKE WITH OR IMMEDIATELY FOLLOWING A MEAL 06/26/17  Yes Stallings, Zoe A, MD  primidone (MYSOLINE) 50 MG tablet Take 50 mg by mouth 2 (two) times daily.  04/05/13  Yes Darlyne Russian, MD  ranitidine (ZANTAC) 150 MG tablet Take 1 tablet (150 mg total) by mouth 2 (two) times daily. 05/06/17  Yes Runette Scifres, PA-C  Tamsulosin HCl (FLOMAX) 0.4 MG CAPS Take 0.4 mg by mouth at bedtime.    Yes [provider]  traMADol (  ULTRAM) 50 MG tablet Take 1 tablet (50 mg total) every 8 (eight) hours as needed by mouth for severe pain. 09/27/17  Yes Harrison Mons, PA-C     Allergies  Allergen Reactions  . Lisinopril Cough and Other (See Comments)    Cough  . Other Other (See Comments)    BLOOD PRODUCT REFUSAL (patient is a Jehovah's Witness) BLOOD PRODUCT REFUSAL (patient is a Jehovah's Witness)       Objective:  Physical Exam  Constitutional: He is oriented to person, place, and time. He appears well-developed and well-nourished. He is active and cooperative. No distress.  BP 124/64 (BP Location: Left Arm, Patient Position: Sitting, Cuff Size: Large)    Pulse 93   Temp 99.6 F (37.6 C) (Oral)   Resp 18   Ht 5' 4.76" (1.645 m)   Wt 229 lb 6.4 oz (104.1 kg)   SpO2 98%   BMI 38.45 kg/m   HENT:  Head: Normocephalic and atraumatic.  Right Ear: Hearing normal.  Left Ear: Hearing normal.  Eyes: Conjunctivae are normal. No scleral icterus.  Neck: Normal range of motion. Neck supple. No thyromegaly present.  Cardiovascular: Normal rate, regular rhythm and normal heart sounds.  Pulses:      Radial pulses are 2+ on the right side, and 2+ on the left side.  Pulmonary/Chest: Effort normal and breath sounds normal.  Abdominal: Soft. Normal appearance and bowel sounds are normal. He exhibits no shifting dullness, no distension, no pulsatile liver, no fluid wave, no abdominal bruit, no ascites, no pulsatile midline mass and no mass. There is no hepatosplenomegaly. There is tenderness in the right upper quadrant and periumbilical area. There is no rigidity, no rebound, no guarding, no CVA tenderness, no tenderness at McBurney's point and negative Murphy's sign.    Lymphadenopathy:       Head (right side): No tonsillar, no preauricular, no posterior auricular and no occipital adenopathy present.       Head (left side): No tonsillar, no preauricular, no posterior auricular and no occipital adenopathy present.    He has no cervical adenopathy.       Right: No supraclavicular adenopathy present.       Left: No supraclavicular adenopathy present.  Neurological: He is alert and oriented to person, place, and time. No sensory deficit.  Skin: Skin is warm, dry and intact. No rash noted. No cyanosis or erythema. Nails show no clubbing.  Psychiatric: He has a normal mood and affect. His speech is normal and behavior is normal.   Wt Readings from Last 3 Encounters:  04/18/18 229 lb 6.4 oz (104.1 kg)  03/18/18 229 lb 14.4 oz (104.3 kg)  02/24/18 230 lb 6.4 oz (104.5 kg)      Assessment & Plan:   1. Right upper quadrant abdominal pain 2. Periumbilical  abdominal pain He has not taken ranitidine.  Notes that his pain occurs in the mornings after he takes his medications.  He used to take several, Mysoline, tramadol and hydrocodone, with food.  Because he was wanting to streamline things, he started taking all of his morning medications on an empty stomach.  2 days later is when the pain began.  I recommend that he resume taking his morning medications with food.  While that is one potential cause for his pain, he understands that if his symptoms persist, he could resume ranitidine twice daily and that if symptoms continue to persist he may need GI specialty evaluation.    Return if symptoms worsen  or fail to improve.   Fara Chute, PA-C [facility]

## 2018-04-22 ENCOUNTER — Ambulatory Visit (HOSPITAL_COMMUNITY)
Admission: RE | Admit: 2018-04-22 | Discharge: 2018-04-22 | Disposition: A | Discharge status: Home / Self Care | Payer: Medicare HMO | Source: Ambulatory Visit | Attending: Hematology | Admitting: Hematology

## 2018-04-22 DIAGNOSIS — C8580 Other specified types of non-Hodgkin lymphoma, unspecified site: Secondary | ICD-10-CM | POA: Diagnosis not present

## 2018-04-22 DIAGNOSIS — Z79899 Other long term (current) drug therapy: Secondary | ICD-10-CM | POA: Insufficient documentation

## 2018-04-22 DIAGNOSIS — C8330 Diffuse large B-cell lymphoma, unspecified site: Secondary | ICD-10-CM | POA: Diagnosis not present

## 2018-04-22 LAB — GLUCOSE, CAPILLARY: GLUCOSE-CAPILLARY: 101 mg/dL — AB (ref 65–99)

## 2018-04-22 MED ORDER — FLUDEOXYGLUCOSE F - 18 (FDG) INJECTION
10.0000 | Freq: Once | INTRAVENOUS | Status: AC
  Administered 2018-04-22: 10 via INTRAVENOUS

## 2018-04-28 NOTE — Progress Notes (Signed)
HEMATOLOGY ONCOLOGY CLINIC NOTE  Date of Service: 04/29/18   Patient Care Team: Shawnee Knapp, MD as PCP - General (Family Medicine) [name] as Physician Assistant (Emergency Medicine) [name], MD as Consulting Physician (Orthopedic Surgery) [name], MD as Consulting Physician (Oncology) Helayne Seminole, MD as Consulting Physician (Otolaryngology) Edrick Kins, DPM as Consulting Physician (Podiatry)  CHIEF COMPLAINTS F/u for  Marginal Zone lymphoma   HISTORY OF PRESENTING ILLNESS:   Bryan [name] 72 y.o. male is here because of a referral from ENT [name] regarding a concern of MALT lymphoma.   He is accompanied today by 4 members of his family. The pt reports that he is doing well overall. The pt's PCP is [name]. The pt sees [name] for his Rheumatology. The pt went off of humira a month before his mass appeared two months. The pt takes Methotrexate 15 mg once each week for his rheumatoid arthritis. He notes that he took humira for 8-10 months, and hasn't taken it in about 3 months. He began taking prednisone 15mg  q day, 2 months ago.   He reports having high blood pressure and takes amlodipine, metoprolol for it. He reports having had back and neck surgery for discs.  He also notes having essential tremors in his hands for which he takes primidone.  He reports having run out of flomax. He has had some PTSD from his time in the [place] war.  He notes he has had drenching night sweats for the last 15 years that are of intermittent frequency. He denies any other medical issues.    He first noticed swelling of his left cheek about two months ago. He notes that it was "hard like a rock." He was placed on prednisone for about a month after going to the doctor, with some relief. He then had his needle Bx recorded below.   He reports gum pain that began this morning, and also reports that his tongue feels as if it has  bumps on it. He reports taking folic acid 1mg  each day.    On 10/29/17 the pt had a neck CT revealing Multifocal left parotid lesion. Multiple ill-defined enhancing nodules in the left parotid gland. Asymmetric enhancing lymph nodes in the left neck are not pathologically enlarged however given the asymmetry and the left parotid lesion, they could represent neoplastic spread of parotid malignancy or lymphoma. Tissue sampling recommended.  Of note prior to the patient's visit, pt has had a biopsy of his left parotid gland completed on 12/03/17 with results revealing Atypical lymphoid proliferation. 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 -   B cell clonality study was positive for clonality.   On review of systems, pt reports gum pain, fatigue (not recent), occasional night sweats, ankle pain, and denies abdominal pains, back pain, flank pain, leg swelling, swollen or painful joints beside ankles.   On PMHx the pt has had rheumatoid arthritis for 6 years and HTN. He notes that he has anemia and is a Acupuncturist Witness I- which he notes means he would not want to consider blood products  On Surgical Hx the pt had back surgery. On Social Hx the pt quit smoking in 1976 after smoking about 8 cigarettes each day for 15 years. He notes drinking ETOH about once a week. He denies chemical or radiation exposure,  except for agent orange.  On Family Hx the pt notes high blood pressure, DM, but denies autoimmune conditions, cancers, or blood disorders.   INTERVAL HISTORY:   Bryan [name] is here for a scheduled follow-up of his marginal zone lymphoma and for a toxicity check post Rituxan treatment. The patient's last visit with [place] was on 03/18/18. He is accompanied today by his wife. The pt reports that he is doing well overall.   The pt reports that he is feeling pretty well  overall and is taking 200mg  Plaquenil three times a day and has stopped taking Prednisone completely. He continue to follow up with his rheumatologist. He also notes that he is trying to walk more.   Of note since the patient's last visit, pt has had PET/CT completed on 04/22/18 with results revealing Complete metabolic response to therapy. No residual hypermetabolic mass or lymphadenopathy.  Lab results today (04/29/18) of CBC, CMP, and Reticulocytes is as follows: all values are WNL except for CO2 at 32. LDH 04/29/18 is WNL at 236  On review of systems, pt reports stable back pain, good energy levels, eating well, and denies abdominal pains, any other symptoms.    REVIEW OF SYSTEMS:   A 10+ POINT REVIEW OF SYSTEMS WAS OBTAINED including neurology, dermatology, psychiatry, cardiac, respiratory, lymph, extremities, GI, GU, Musculoskeletal, constitutional, breasts, reproductive, HEENT.  All pertinent positives are noted in the HPI.  All others are negative.    MEDICAL HISTORY:  Past Medical History:  Diagnosis Date  . Anemia   . Arthritis    hands, knees, cervical area. Back pain. Rheumatoid arthritis- weekly injections.  [name] BPH (benign prostatic hypertrophy)   . Cancer ([place])   . Essential tremor   . GERD (gastroesophageal reflux disease)    reports for indigestion he uses mustard   . Hearing deficit    wears hearing aids bilateral  . HTN (hypertension)   . Obesity   . Refusal of blood transfusions as patient is Jehovah's Witness   . Right bundle branch block    history of  . Seizures ([facility])    AS A CHILD. only esential tremors now.  . Sleep apnea    cpap - settings at 9 per patient   . Ulcer   JEHOVA's WITNESS  SURGICAL HISTORY: Past Surgical History:  Procedure Laterality Date  . ANTERIOR CERVICAL DECOMP/DISCECTOMY FUSION N/A 03/30/2013   Procedure: ANTERIOR CERVICAL DECOMPRESSION/DISCECTOMY FUSION 2 LEVELS;  Surgeon: Otilio Connors, MD;  Location: [facility];  Service:  Neurosurgery;  Laterality: N/A;  C4-5 C5-6 Anterior cervical decompression/diskectomy/fusion/Allograft/Plate  . CHOLECYSTECTOMY N/A 12/07/2013   Procedure: LAPAROSCOPIC CHOLECYSTECTOMY WITH INTRAOPERATIVE CHOLANGIOGRAM;  Surgeon: Adin Hector, MD;  Location: [facility];  Service: General;  Laterality: N/A;  . COLONOSCOPY    . DECOMPRESSIVE LUMBAR LAMINECTOMY LEVEL 2 N/A 02/15/2015   Procedure: COMPLETE DECOMPRESSIVE LUMBAR LAMINECTOMY L4-L5/ FORAMINOTOMY TO L4 NERVE ROOT AND L5 NERVE ROOT BILATERALLY;  Surgeon: [name], MD;  Location: [facility];  Service: Orthopedics;  Laterality: N/A;  . EYE SURGERY     right, growth excision  . LUMBAR LAMINECTOMY/DECOMPRESSION MICRODISCECTOMY Left 01/18/2016   Procedure:  DECOMPRESSION L4-L5 MICRODISCECTOMY L4-L5 ON LEFT FOR SPINAL STENOSIS;  Surgeon: [name], MD;  Location: [facility];  Service: Orthopedics;  Laterality: Left;  . PAROTIDECTOMY Left 01/24/2018   Procedure: INCISIONAL BIOPSY OF LEFT PAROTID    MASS;  Surgeon: Helayne Seminole, MD;  Location: [facility];  Service: ENT;  Laterality: Left;  . SPINE SURGERY    .  TONSILLECTOMY    . VASECTOMY    . WRIST GANGLION EXCISION Left     SOCIAL HISTORY: Social History   Socioeconomic History  . Marital status: Married    Spouse name: Not on file  . Number of children: 2  . Years of education: Not on file  . Highest education level: Not on file  Occupational History  . Occupation: bus Education administrator: RETIRED  Social Needs  . Financial resource strain: Not on file  . Food insecurity:    Worry: Not on file    Inability: Not on file  . Transportation needs:    Medical: Not on file    Non-medical: Not on file  Tobacco Use  . Smoking status: Former Smoker    Types: Cigarettes    Last attempt to quit: 12/14/1972    Years since quitting: 45.4  . Smokeless tobacco: Never Used  Substance and Sexual Activity  . Alcohol use: Yes    Comment: occasionally  . Drug use: No  . Sexual activity: Yes     Birth control/protection: None  Lifestyle  . Physical activity:    Days per week: Not on file    Minutes per session: Not on file  . Stress: Not on file  Relationships  . Social connections:    Talks on phone: Not on file    Gets together: Not on file    Attends religious service: Not on file    Active member of club or organization: Not on file    Attends meetings of clubs or organizations: Not on file    Relationship status: Not on file  . Intimate partner violence:    Fear of current or ex partner: Not on file    Emotionally abused: Not on file    Physically abused: Not on file    Forced sexual activity: Not on file  Other Topics Concern  . Not on file  Social History Narrative  . Not on file    FAMILY HISTORY: Family History  Problem Relation Age of Onset  . Colon cancer Maternal Grandfather   . Kidney disease Brother   . Stroke Maternal Grandmother   . Diabetes Paternal Aunt        x 4 aunts    ALLERGIES:  is allergic to lisinopril and other.  MEDICATIONS:  Current Outpatient Medications  Medication Sig Dispense Refill  . aspirin EC 81 MG tablet Take 81 mg by mouth daily.    [name] gabapentin (NEURONTIN) 300 MG capsule TAKE 1 CAPSULE BY MOUTH THREE TIMES DAILY 90 capsule 0  . hydrochlorothiazide (HYDRODIURIL) 25 MG tablet TAKE 25 MG BY MOUTH ONCE DAILY 90 tablet 3  . HYDROcodone-acetaminophen (NORCO/VICODIN) 5-325 MG tablet Take 1 tablet by mouth every 8 (eight) hours as needed for moderate pain.    . metoprolol succinate (TOPROL-XL) 50 MG 24 hr tablet TAKE 1 TABLET BY MOUTH ONCE DAILY. TAKE WITH OR IMMEDIATELY FOLLOWING A MEAL (Patient taking differently: TAKE 50 MG BY MOUTH ONCE DAILY. TAKE WITH OR IMMEDIATELY FOLLOWING A MEAL) 90 tablet 3  . primidone (MYSOLINE) 50 MG tablet Take 50 mg by mouth 2 (two) times daily.     . ranitidine (ZANTAC) 150 MG tablet Take 1 tablet (150 mg total) by mouth 2 (two) times daily. 60 tablet 0  . Tamsulosin HCl (FLOMAX) 0.4 MG CAPS  Take 0.4 mg by mouth at bedtime.     . traMADol (ULTRAM) 50 MG tablet Take 1 tablet (50 mg  total) every 8 (eight) hours as needed by mouth for severe pain. 30 tablet 0   No current facility-administered medications for this visit.     PHYSICAL EXAMINATION: ECOG PERFORMANCE STATUS: 1 - Symptomatic but completely ambulatory  Vitals:   04/29/18 1200  BP: (!) 155/73  Pulse: 76  Resp: 18  Temp: 98.6 F (37 C)  SpO2: 97%   Filed Weights   04/29/18 1200  Weight: 235 lb 8 oz (106.8 kg)   .  GENERAL:alert, in no acute distress and comfortable SKIN: no acute rashes, no significant lesions EYES: conjunctiva are pink and non-injected, sclera anicteric OROPHARYNX: MMM, no exudates, no oropharyngeal erythema or ulceration NECK: supple, no JVD LYMPH:  no palpable lymphadenopathy in the cervical, axillary or inguinal regions LUNGS: clear to auscultation b/l with normal respiratory effort HEART: regular rate & rhythm ABDOMEN:  normoactive bowel sounds , non tender, not distended. Extremity: no pedal edema PSYCH: alert & oriented x 3 with fluent speech NEURO: no focal motor/sensory deficits    LABS CBC Latest Ref Rng & Units 04/29/2018 03/18/2018 02/28/2018  WBC 4.0 - 10.3 K/uL 5.7 7.4 7.9  Hemoglobin 13.0 - 17.1 g/dL 13.3 13.7 13.9  Hematocrit 38.4 - 49.9 % 41.4 41.9 42.4  Platelets 140 - 400 K/uL 257 195 188   CBC    Component Value Date/Time   WBC 5.7 04/29/2018 1048   RBC 4.26 04/29/2018 1048   RBC 4.26 04/29/2018 1048   HGB 13.3 04/29/2018 1048   HGB 13.7 03/18/2018 0848   HCT 41.4 04/29/2018 1048   HCT 42.7 12/19/2017 1416   PLT 257 04/29/2018 1048   PLT 195 03/18/2018 0848   MCV 97.2 04/29/2018 1048   MCV 96.7 06/10/2017 1540   MCH 31.2 04/29/2018 1048   MCHC 32.1 04/29/2018 1048   RDW 13.5 04/29/2018 1048   LYMPHSABS 1.5 04/29/2018 1048   MONOABS 0.5 04/29/2018 1048   EOSABS 0.1 04/29/2018 1048   BASOSABS 0.0 04/29/2018 1048    CMP Latest Ref Rng & Units 04/29/2018  03/18/2018 02/28/2018  Glucose 70 - 140 mg/dL 121 187(H) 229(H)  BUN 7 - 26 mg/dL 15 20 24   Creatinine 0.70 - 1.30 mg/dL 1.27 1.34(H) 1.43(H)  Sodium 136 - 145 mmol/L 142 141 141  Potassium 3.5 - 5.1 mmol/L 3.8 3.6 3.4(L)  Chloride 98 - 109 mmol/L 101 102 101  CO2 22 - 29 mmol/L 32(H) 31(H) 30(H)  Calcium 8.4 - 10.4 mg/dL 9.3 9.0 9.2  Total Protein 6.4 - 8.3 g/dL 7.1 6.8 7.0  Total Bilirubin 0.2 - 1.2 mg/dL 0.2 0.3 0.4  Alkaline Phos 40 - 150 U/L 135 104 103  AST 5 - 34 U/L 19 17 17   ALT 0 - 55 U/L 14 20 23       Lab Results  Component Value Date   LDH 236 04/29/2018    Component     Latest Ref Rng & Units 12/19/2017  Iron     42 - 163 ug/dL 130  TIBC     202 - 409 ug/dL 331  Saturation Ratios     42 - 163 % 39 (L)  UIBC     ug/dL 201  Folate, Hemolysate     Not Estab. ng/mL 565.3  HCT     37.5 - 51.0 % 42.7  Folate, RBC     >498 ng/mL 1,324  Vitamin B12     180 - 914 pg/mL 264  Ferritin     22 - 316 ng/mL 42  Hepatitis B Surface Ag     Negative Negative  Hep B Core Ab, Tot     Negative Negative  HCV Ab     0.0 - 0.9 s/co ratio <0.1   LABORATORY DATA:    01/27/18 Flow Cytometry:   01/27/18 Parotid Gland Surgical Pathology   RADIOGRAPHIC STUDIES: I have personally reviewed the radiological images as listed and agreed with the findings in the report. Nm Pet Image Restag (ps) Skull Base To Thigh  Result Date: 04/22/2018 CLINICAL DATA:  Subsequent treatment strategy for marginal zone B-cell lymphoma. Undergoing chemotherapy. EXAM: NUCLEAR MEDICINE PET SKULL BASE TO THIGH TECHNIQUE: 10.0 mCi F-18 FDG was injected intravenously. Full-ring PET imaging was performed from the skull base to thigh after the radiotracer. CT data was obtained and used for attenuation correction and anatomic localization. Fasting blood glucose: 101 mg/dl COMPARISON:  01/01/2018 FINDINGS: (Reference/background mediastinal blood pool activity: SUV max = 2.6) NECK: No hypermetabolic lymph nodes  or masses. Previously seen hypermetabolic left parotid mass and cervical lymphadenopathy have resolved since previous study. Incidental CT findings:  None. CHEST: No hypermetabolic masses or lymphadenopathy. No suspicious pulmonary nodules seen on CT images. Incidental CT findings:  Stable right lung scarring. ABDOMEN/PELVIS: No abnormal hypermetabolic activity within the liver, pancreas, adrenal glands, or spleen. No hypermetabolic lymph nodes in the abdomen or pelvis. Incidental CT findings: Diverticulosis is seen mainly involving the descending and sigmoid colon, however there is no evidence of diverticulitis. SKELETON: No focal hypermetabolic bone lesions to suggest skeletal metastasis. Incidental CT findings:  None. IMPRESSION: Complete metabolic response to therapy. No residual hypermetabolic mass or lymphadenopathy. Electronically Signed   By: Earle Gell M.D.   On: 04/22/2018 13:52    ASSESSMENT & PLAN:   72 y.o. is a  male with    1.Recently diagnosed Stage IIE Marginal-zone lymphoma involving the left parotid gland, left cervical LN and mediastinal lymph nodes. . Lab Results  Component Value Date   LDH 236 04/29/2018   Hep C neg and Hep B -He completed 4 dose Rituxan 02/07/18-02/28/18.  LDH has normalized and the Parotid mass is no longer palpable.  2. Jehovas Witness- declines any use of blood products.  PLAN  -EPO and other non-blood products would be considered if his RBC numbers drop, as opposed to PRBC since the pt notes he is opposed to blood transfusions given that he is a Jehovah's Witness. No indication at this time. -Discussed pt labwork today, 04/29/18; blood counts are normalized -Reviewed 04/22/18 PET/CT with pt which revealed complete metabolic response to therapy. No residual hypermetabolic mass or lymphadenopathy. -Discussed that there is not a strong indication for maintenance Rituxan from a Lymphoma standpoint -Rheumatology may decide there is a place for Rituxan in  his RA treatment -Pt shows no radiographic, lab or clinical evidence of lymphoma at this time.  -Will see pt back in 4 months -Continue follow up with rheumatology -Discussed that if/when lymphoma comes back, we would treat pt with Rituxan again -Continue staying as active as possible     2. Rheumatoid Arthritis Sed rate has normalized. Hopefully his Rituxan might help his RA as well. - off  MTX and prednisone and is currently on plaquenil per his rheumatologist, Dr. Amil Amen and PA Leafy Kindle.  -Rituxan might help with some control of his RA. Other treatments based on symptoms post-Rituxan. -I encouraged him to be more active with at least walking and possibly start PT or Silver Sneakers program before considering any stronger medication. He agreed.  3. Low B12 levels -continue B12 SL 1076mcg SL daily  4. Wheezing - resolved currently PFTs 02/26/2018 - Pulmonary Function Diagnosis: Minimal Obstructive Airways Disease Minimal Restriction - Moderate Diffusion Defect PLAN -f/u with PCP to mx this. -will defer to rheumatology for evaluation/management of possible RA associated early ILD -recommended patient focus on increased physical activity and diet to try to achieve body weight.  RTC with Dr Irene Limbo in 4 months with labs   All of the patient's questions were answered with apparent satisfaction. The patient knows to call the clinic with any problems, questions or concerns.  The toal time spent in the appt was 25 minutes and more than 50% was on counseling and direct patient cares and reviewing PET/CT scan results and images.   Sullivan Lone MD [place] AAHIVMS [facility] Hematology/Oncology Physician [facility]  (Office):       [phone] (Work cell):  [phone] (Fax):           [phone]  I, Baldwin [place], am acting as a scribe for Dr Irene Limbo.   .I have reviewed the above documentation for accuracy and completeness, and I agree with the above.  [name] MD

## 2018-04-29 ENCOUNTER — Inpatient Hospital Stay: Payer: Medicare HMO

## 2018-04-29 ENCOUNTER — Telehealth: Payer: Self-pay | Admitting: Hematology

## 2018-04-29 ENCOUNTER — Encounter: Payer: Self-pay | Admitting: Hematology

## 2018-04-29 ENCOUNTER — Inpatient Hospital Stay: Payer: Medicare HMO | Attending: Hematology | Admitting: Hematology

## 2018-04-29 VITALS — BP 155/73 | HR 76 | Temp 98.6°F | Resp 18 | Ht 64.76 in | Wt 235.5 lb

## 2018-04-29 DIAGNOSIS — I10 Essential (primary) hypertension: Secondary | ICD-10-CM | POA: Diagnosis not present

## 2018-04-29 DIAGNOSIS — E538 Deficiency of other specified B group vitamins: Secondary | ICD-10-CM | POA: Insufficient documentation

## 2018-04-29 DIAGNOSIS — Z87891 Personal history of nicotine dependence: Secondary | ICD-10-CM

## 2018-04-29 DIAGNOSIS — M069 Rheumatoid arthritis, unspecified: Secondary | ICD-10-CM | POA: Diagnosis not present

## 2018-04-29 DIAGNOSIS — C884 Extranodal marginal zone B-cell lymphoma of mucosa-associated lymphoid tissue [MALT-lymphoma]: Secondary | ICD-10-CM

## 2018-04-29 DIAGNOSIS — D649 Anemia, unspecified: Secondary | ICD-10-CM | POA: Insufficient documentation

## 2018-04-29 DIAGNOSIS — C8580 Other specified types of non-Hodgkin lymphoma, unspecified site: Secondary | ICD-10-CM

## 2018-04-29 LAB — CBC WITH DIFFERENTIAL/PLATELET
BASOS PCT: 0 %
Basophils Absolute: 0 10*3/uL (ref 0.0–0.1)
EOS ABS: 0.1 10*3/uL (ref 0.0–0.5)
Eosinophils Relative: 2 %
HCT: 41.4 % (ref 38.4–49.9)
HEMOGLOBIN: 13.3 g/dL (ref 13.0–17.1)
Lymphocytes Relative: 26 %
Lymphs Abs: 1.5 10*3/uL (ref 0.9–3.3)
MCH: 31.2 pg (ref 27.2–33.4)
MCHC: 32.1 g/dL (ref 32.0–36.0)
MCV: 97.2 fL (ref 79.3–98.0)
Monocytes Absolute: 0.5 10*3/uL (ref 0.1–0.9)
Monocytes Relative: 8 %
NEUTROS PCT: 64 %
Neutro Abs: 3.7 10*3/uL (ref 1.5–6.5)
Platelets: 257 10*3/uL (ref 140–400)
RBC: 4.26 MIL/uL (ref 4.20–5.82)
RDW: 13.5 % (ref 11.0–14.6)
WBC: 5.7 10*3/uL (ref 4.0–10.3)

## 2018-04-29 LAB — CMP (CANCER CENTER ONLY)
ALT: 14 U/L (ref 0–55)
ANION GAP: 9 (ref 3–11)
AST: 19 U/L (ref 5–34)
Albumin: 3.5 g/dL (ref 3.5–5.0)
Alkaline Phosphatase: 135 U/L (ref 40–150)
BUN: 15 mg/dL (ref 7–26)
CHLORIDE: 101 mmol/L (ref 98–109)
CO2: 32 mmol/L — AB (ref 22–29)
Calcium: 9.3 mg/dL (ref 8.4–10.4)
Creatinine: 1.27 mg/dL (ref 0.70–1.30)
GFR, Estimated: 54 mL/min — ABNORMAL LOW (ref >60)
Glucose, Bld: 121 mg/dL (ref 70–140)
Potassium: 3.8 mmol/L (ref 3.5–5.1)
SODIUM: 142 mmol/L (ref 136–145)
Total Bilirubin: 0.2 mg/dL (ref 0.2–1.2)
Total Protein: 7.1 g/dL (ref 6.4–8.3)

## 2018-04-29 LAB — RETICULOCYTES
RBC.: 4.26 MIL/uL (ref 4.20–5.82)
RETIC COUNT ABSOLUTE: 55.4 10*3/uL (ref 34.8–93.9)
RETIC CT PCT: 1.3 % (ref 0.8–1.8)

## 2018-04-29 LAB — LACTATE DEHYDROGENASE: LDH: 236 U/L (ref 125–245)

## 2018-04-29 NOTE — Telephone Encounter (Signed)
FAXED RECORDS TO MEDICATION MANAGEMENT [facility] ID [id number]

## 2018-04-29 NOTE — Telephone Encounter (Signed)
Appointments scheduled AVS/Calendar printed per 6/18 los °

## 2018-05-08 DIAGNOSIS — M9914 Subluxation complex (vertebral) of sacral region: Secondary | ICD-10-CM | POA: Diagnosis not present

## 2018-05-08 DIAGNOSIS — M9905 Segmental and somatic dysfunction of pelvic region: Secondary | ICD-10-CM | POA: Diagnosis not present

## 2018-05-08 DIAGNOSIS — M5136 Other intervertebral disc degeneration, lumbar region: Secondary | ICD-10-CM | POA: Diagnosis not present

## 2018-05-08 DIAGNOSIS — M9903 Segmental and somatic dysfunction of lumbar region: Secondary | ICD-10-CM | POA: Diagnosis not present

## 2018-05-09 ENCOUNTER — Ambulatory Visit: Payer: Medicare HMO | Admitting: Physician Assistant

## 2018-05-09 ENCOUNTER — Encounter: Payer: Self-pay | Admitting: Physician Assistant

## 2018-05-09 ENCOUNTER — Ambulatory Visit: Payer: Self-pay | Admitting: *Deleted

## 2018-05-09 ENCOUNTER — Other Ambulatory Visit: Payer: Self-pay

## 2018-05-09 VITALS — BP 148/70 | HR 74 | Temp 98.3°F | Resp 16 | Ht 64.0 in | Wt 236.0 lb

## 2018-05-09 DIAGNOSIS — R2681 Unsteadiness on feet: Secondary | ICD-10-CM

## 2018-05-09 LAB — POCT CBC
Granulocyte percent: 67.7 %G (ref 37–80)
HEMATOCRIT: 41.3 % — AB (ref 43.5–53.7)
Hemoglobin: 13.6 g/dL — AB (ref 14.1–18.1)
LYMPH, POC: 1.1 (ref 0.6–3.4)
MCH, POC: 30.9 pg (ref 27–31.2)
MCHC: 32.9 g/dL (ref 31.8–35.4)
MCV: 94 fL (ref 80–97)
MID (CBC): 0.4 (ref 0–0.9)
MPV: 7.2 fL (ref 0–99.8)
PLATELET COUNT, POC: 240 10*3/uL (ref 142–424)
POC Granulocyte: 3.1 (ref 2–6.9)
POC LYMPH %: 23 % (ref 10–50)
POC MID %: 9.3 % (ref 0–12)
RBC: 4.39 M/uL — AB (ref 4.69–6.13)
RDW, POC: 14.1 %
WBC: 4.6 10*3/uL (ref 4.6–10.2)

## 2018-05-09 LAB — POCT URINALYSIS DIP (MANUAL ENTRY)
BILIRUBIN UA: NEGATIVE
GLUCOSE UA: NEGATIVE mg/dL
Ketones, POC UA: NEGATIVE mg/dL
Leukocytes, UA: NEGATIVE
NITRITE UA: NEGATIVE
Protein Ur, POC: NEGATIVE mg/dL
RBC UA: NEGATIVE
Spec Grav, UA: 1.02 (ref 1.010–1.025)
UROBILINOGEN UA: 0.2 U/dL
pH, UA: 6 (ref 5.0–8.0)

## 2018-05-09 NOTE — Patient Instructions (Signed)
     IF you received an x-ray today, you will receive an invoice from [facility]. Please contact [facility] at [phone] with questions or concerns regarding your invoice.   IF you received labwork today, you will receive an invoice from [facility]. Please contact [facility] at [phone] with questions or concerns regarding your invoice.   Our billing staff will not be able to assist you with questions regarding bills from these companies.  You will be contacted with the lab results as soon as they are available. The fastest way to get your results is to activate your My Chart account. Instructions are located on the last page of this paperwork. If you have not heard from us regarding the results in 2 weeks, please contact this office.     

## 2018-05-09 NOTE — Telephone Encounter (Signed)
Pt reports intermittent lightheadedness x 2 weeks, worsening past 2 days. "Not spinning, just off balance." States is positional, worse sitting to standing. H/O Meniere's 6 years ago. "Feels like that." Pt has home monitor B/P but "Not working." Denies any CP, SOB, headache,nausea, congestion. States has to hold onto things when ambulating; worse in mornings, often resolves by afternoon. Denies any visual changes; states is staying hydrated.  Pt unable to check heart rate with instructions given.  H/O HTN, anemia, lymphoma. Same day appt made with M. Bess Harvest. Care advise given per protocol. Instructed to call back if symptoms worsen.   Reason for Disposition . [1] MODERATE dizziness (e.g., interferes with normal activities) AND [2] has NOT been evaluated by physician for this  (Exception: dizziness caused by heat exposure, sudden standing, or poor fluid intake)  Answer Assessment - Initial Assessment Questions 1. DESCRIPTION: "Describe your dizziness."     Balance off 2. LIGHTHEADED: "Do you feel lightheaded?" (e.g., somewhat faint, woozy, weak upon standing)     lightheadedness 3. VERTIGO: "Do you feel like either you or the room is spinning or tilting?" (i.e. vertigo)     no 4. SEVERITY: "How bad is it?"  "Do you feel like you are going to faint?" "Can you stand and walk?"   - MILD - walking normally   - MODERATE - interferes with normal activities (e.g., work, school)    - SEVERE - unable to stand, requires support to walk, feels like passing out now.      Moderate . Sitting to standing 5. ONSET:  "When did the dizziness begin?"      2 weeks, worsening 6. AGGRAVATING FACTORS: "Does anything make it worse?" (e.g., standing, change in head position)     Standing 7. HEART RATE: "Can you tell me your heart rate?" "How many beats in 15 seconds?"  (Note: not all patients can do this)       No, pt unable with directions 8. CAUSE: "What do you think is causing the dizziness?"     Not sure maybe  Menieres is back . H/O 6 years ago. 9. RECURRENT SYMPTOM: "Have you had dizziness before?" If so, ask: "When was the last time?" "What happened that time?"     Inner ear disorder disorder 10. OTHER SYMPTOMS: "Do you have any other symptoms?" (e.g., fever, chest pain, vomiting, diarrhea, bleeding) no  Protocols used: DIZZINESS [facility]

## 2018-05-09 NOTE — Progress Notes (Signed)
05/10/2018 10:11 AM   DOB: 09-30-1946 / MRN: [id number]  SUBJECTIVE:  Bryan [name] is a 72 y.o. male presenting for unsteadiness. Started taking narcotic about 1 month ago for neck pain and unsteadiness developed shortly thereafter.  He is taking Norco which does control the pain well. Notes a pain pill tends to make him feel a little more "unsteady."  He also thinks his age has something to do with the chief complaint. He denies chest pain, room spinning, SOB, leg swelling, orthopnea.  He is allergic to lisinopril and other.   He  has a past medical history of Anemia, Arthritis, BPH (benign prostatic hypertrophy), Cancer ([place]), Essential tremor, GERD (gastroesophageal reflux disease), Hearing deficit, HTN (hypertension), Obesity, Refusal of blood transfusions as patient is Jehovah's Witness, Right bundle branch block, Seizures ([place]), Sleep apnea, and Ulcer.    He  reports that he quit smoking about 45 years ago. His smoking use included cigarettes. He has never used smokeless tobacco. He reports that he drinks alcohol. He reports that he does not use drugs. He  reports that he currently engages in sexual activity. He reports using the following method of birth control/protection: None. The patient  has a past surgical history that includes Tonsillectomy; Eye surgery; Colonoscopy; Vasectomy; Anterior cervical decomp/discectomy fusion (N/A, 03/30/2013); Spine surgery; Wrist ganglion excision (Left); Cholecystectomy (N/A, 12/07/2013); Decompressive lumbar laminectomy level 2 (N/A, 02/15/2015); Lumbar laminectomy/decompression microdiscectomy (Left, 01/18/2016); and Parotidectomy (Left, 01/24/2018).  His family history includes Colon cancer in his maternal grandfather; Diabetes in his paternal aunt; Kidney disease in his brother; Stroke in his maternal grandmother.  ROS  The problem list and medications were reviewed and updated by myself where necessary and exist elsewhere in the encounter.    OBJECTIVE:  BP (!) 148/70   Pulse 74   Temp 98.3 F (36.8 C) (Oral)   Resp 16   Ht 5\' 4"  (1.626 m)   Wt 236 lb (107 kg)   SpO2 99%   BMI 40.51 kg/m   Wt Readings from Last 3 Encounters:  05/09/18 236 lb (107 kg)  04/29/18 235 lb 8 oz (106.8 kg)  04/18/18 229 lb 6.4 oz (104.1 kg)   Temp Readings from Last 3 Encounters:  05/09/18 98.3 F (36.8 C) (Oral)  04/29/18 98.6 F (37 C) (Oral)  04/18/18 99.6 F (37.6 C) (Oral)   BP Readings from Last 3 Encounters:  05/09/18 (!) 148/70  04/29/18 (!) 155/73  04/18/18 124/64   Pulse Readings from Last 3 Encounters:  05/09/18 74  04/29/18 76  04/18/18 93    Physical Exam  Constitutional: He is oriented to person, place, and time. He appears well-developed. He does not appear ill.  HENT:  Right Ear: Hearing, tympanic membrane, external ear and ear canal normal.  Left Ear: Hearing, tympanic membrane, external ear and ear canal normal.  Nose: Nose normal. Right sinus exhibits no maxillary sinus tenderness and no frontal sinus tenderness. Left sinus exhibits no maxillary sinus tenderness and no frontal sinus tenderness.  Mouth/Throat: Uvula is midline, oropharynx is clear and moist and mucous membranes are normal. No oropharyngeal exudate, posterior oropharyngeal edema or tonsillar abscesses.  Eyes: Pupils are equal, round, and reactive to light. Conjunctivae and EOM are normal.  Cardiovascular: Normal rate, regular rhythm, S1 normal, S2 normal, normal heart sounds, intact distal pulses and normal pulses. Exam reveals no gallop and no friction rub.  No murmur heard. Pulmonary/Chest: Effort normal. No stridor. No respiratory distress. He has no wheezes. He  has no rales.  Abdominal: He exhibits no distension.  Musculoskeletal: Normal range of motion. He exhibits no edema.  Lymphadenopathy:       Head (right side): No submandibular and no tonsillar adenopathy present.       Head (left side): No submandibular and no tonsillar  adenopathy present.    He has no cervical adenopathy.  Neurological: He is alert and oriented to person, place, and time. He has normal strength and normal reflexes. He is not disoriented. No cranial nerve deficit or sensory deficit. He exhibits normal muscle tone. Coordination and gait normal.  Skin: Skin is warm and dry. He is not diaphoretic.  Psychiatric: He has a normal mood and affect. His behavior is normal.  Nursing note and vitals reviewed.   CBC Latest Ref Rng & Units 05/09/2018 04/29/2018 03/18/2018  WBC 4.6 - 10.2 K/uL 4.6 5.7 7.4  Hemoglobin 14.1 - 18.1 g/dL 13.6(A) 13.3 13.7  Hematocrit 43.5 - 53.7 % 41.3(A) 41.4 41.9  Platelets 140 - 400 K/uL - 257 195    Lab Results  Component Value Date   CREATININE 1.27 04/29/2018   BUN 15 04/29/2018   NA 142 04/29/2018   K 3.8 04/29/2018   CL 101 04/29/2018   CO2 32 (H) 04/29/2018   Lab Results  Component Value Date   ALT 14 04/29/2018   AST 19 04/29/2018   ALKPHOS 135 04/29/2018   BILITOT 0.2 04/29/2018         ASSESSMENT AND PLAN:  [name] was seen today for dizziness.  Diagnoses and all orders for this visit:  General unsteadiness: CBC, EKG without abrupt change.Patient is not dehydrated per urine and he is not orthostatic.  Neurological exam WNL.  Most likely iatrogenic. Advised he half his narcotic. Offered PT however patient wants to try halfing his medication.  He will come back if he is not feeling better.  -     EKG 12-Lead -     POCT CBC -     POCT urinalysis dipstick    The patient is advised to call or return to clinic if he does not see an improvement in symptoms, or to seek the care of the closest emergency department if he worsens with the above plan.   [name], MHS, PA-C [facility] 05/10/2018 10:11 AM

## 2018-05-12 DIAGNOSIS — M9905 Segmental and somatic dysfunction of pelvic region: Secondary | ICD-10-CM | POA: Diagnosis not present

## 2018-05-12 DIAGNOSIS — M9914 Subluxation complex (vertebral) of sacral region: Secondary | ICD-10-CM | POA: Diagnosis not present

## 2018-05-12 DIAGNOSIS — M5136 Other intervertebral disc degeneration, lumbar region: Secondary | ICD-10-CM | POA: Diagnosis not present

## 2018-05-12 DIAGNOSIS — M9903 Segmental and somatic dysfunction of lumbar region: Secondary | ICD-10-CM | POA: Diagnosis not present

## 2018-05-14 ENCOUNTER — Ambulatory Visit (INDEPENDENT_AMBULATORY_CARE_PROVIDER_SITE_OTHER): Payer: Medicare HMO

## 2018-05-14 ENCOUNTER — Ambulatory Visit: Payer: Medicare HMO | Admitting: Podiatry

## 2018-05-14 ENCOUNTER — Other Ambulatory Visit: Payer: Self-pay | Admitting: Podiatry

## 2018-05-14 ENCOUNTER — Encounter: Payer: Self-pay | Admitting: Podiatry

## 2018-05-14 DIAGNOSIS — M9905 Segmental and somatic dysfunction of pelvic region: Secondary | ICD-10-CM | POA: Diagnosis not present

## 2018-05-14 DIAGNOSIS — M5136 Other intervertebral disc degeneration, lumbar region: Secondary | ICD-10-CM | POA: Diagnosis not present

## 2018-05-14 DIAGNOSIS — M19072 Primary osteoarthritis, left ankle and foot: Secondary | ICD-10-CM

## 2018-05-14 DIAGNOSIS — M659 Synovitis and tenosynovitis, unspecified: Secondary | ICD-10-CM | POA: Diagnosis not present

## 2018-05-14 DIAGNOSIS — M25572 Pain in left ankle and joints of left foot: Secondary | ICD-10-CM

## 2018-05-14 DIAGNOSIS — M2140 Flat foot [pes planus] (acquired), unspecified foot: Secondary | ICD-10-CM

## 2018-05-14 DIAGNOSIS — M9914 Subluxation complex (vertebral) of sacral region: Secondary | ICD-10-CM | POA: Diagnosis not present

## 2018-05-14 DIAGNOSIS — M9903 Segmental and somatic dysfunction of lumbar region: Secondary | ICD-10-CM | POA: Diagnosis not present

## 2018-05-14 MED ORDER — MELOXICAM 15 MG PO TABS: 15.0000 mg | ORAL_TABLET | Freq: Every day | ORAL | 1 refills | Status: AC

## 2018-05-19 DIAGNOSIS — M5136 Other intervertebral disc degeneration, lumbar region: Secondary | ICD-10-CM | POA: Diagnosis not present

## 2018-05-19 DIAGNOSIS — M9903 Segmental and somatic dysfunction of lumbar region: Secondary | ICD-10-CM | POA: Diagnosis not present

## 2018-05-19 DIAGNOSIS — M9905 Segmental and somatic dysfunction of pelvic region: Secondary | ICD-10-CM | POA: Diagnosis not present

## 2018-05-19 DIAGNOSIS — M9914 Subluxation complex (vertebral) of sacral region: Secondary | ICD-10-CM | POA: Diagnosis not present

## 2018-05-20 DIAGNOSIS — M5136 Other intervertebral disc degeneration, lumbar region: Secondary | ICD-10-CM | POA: Diagnosis not present

## 2018-05-20 DIAGNOSIS — M9905 Segmental and somatic dysfunction of pelvic region: Secondary | ICD-10-CM | POA: Diagnosis not present

## 2018-05-20 DIAGNOSIS — M9914 Subluxation complex (vertebral) of sacral region: Secondary | ICD-10-CM | POA: Diagnosis not present

## 2018-05-20 DIAGNOSIS — M9903 Segmental and somatic dysfunction of lumbar region: Secondary | ICD-10-CM | POA: Diagnosis not present

## 2018-05-22 DIAGNOSIS — M9903 Segmental and somatic dysfunction of lumbar region: Secondary | ICD-10-CM | POA: Diagnosis not present

## 2018-05-22 DIAGNOSIS — M5136 Other intervertebral disc degeneration, lumbar region: Secondary | ICD-10-CM | POA: Diagnosis not present

## 2018-05-22 DIAGNOSIS — M9914 Subluxation complex (vertebral) of sacral region: Secondary | ICD-10-CM | POA: Diagnosis not present

## 2018-05-22 DIAGNOSIS — M9905 Segmental and somatic dysfunction of pelvic region: Secondary | ICD-10-CM | POA: Diagnosis not present

## 2018-05-22 NOTE — Progress Notes (Signed)
   Subjective:  72 year old male presenting today with a chief complaint of intermittent left lateral foot and ankle pain that began two months ago. Walking and bearing weight increases the pain. He has been elevating the foot which helps temporarily alleviate the pain. Patient is here for further evaluation and treatment.   Past Medical History:  Diagnosis Date  . Anemia   . Arthritis    hands, knees, cervical area. Back pain. Rheumatoid arthritis- weekly injections.  [name] BPH (benign prostatic hypertrophy)   . Cancer ([place])   . Essential tremor   . GERD (gastroesophageal reflux disease)    reports for indigestion he uses mustard   . Hearing deficit    wears hearing aids bilateral  . HTN (hypertension)   . Obesity   . Refusal of blood transfusions as patient is Jehovah's Witness   . Right bundle branch block    history of  . Seizures ([facility])    AS A CHILD. only esential tremors now.  . Sleep apnea    cpap - settings at 9 per patient   . Ulcer        Objective/Physical Exam General: The patient is alert and oriented x3 in no acute distress.  Dermatology: Skin is warm, dry and supple bilateral lower extremities. Negative for open lesions or macerations.  Vascular: Palpable pedal pulses bilaterally. No edema or erythema noted. Capillary refill within normal limits.  Neurological: Epicritic and protective threshold grossly intact bilaterally.   Musculoskeletal Exam: Range of motion within normal limits to all pedal and ankle joints bilateral. Muscle strength 5/5 in all groups bilateral.  Upon weightbearing there is a medial longitudinal arch collapse bilaterally. Remove foot valgus noted to the bilateral lower extremities with excessive pronation upon mid stance. Pain with palpation to the left midfoot and ankle joint.   Radiographic Exam:  Normal osseous mineralization. Joint spaces preserved. No fracture/dislocation/boney destruction.   Pes planus noted on radiographic exam  lateral views. Decreased calcaneal inclination and metatarsal declination angle is noted. Anterior break in the cyma line noted on lateral views. Medial talar head to deviation noted on AP radiograph.  Degenerative changes noted throughout the pedal joints of the left foot.   Assessment: 1. pes planus bilateral 2. DJD left foot 3. DJD/synovitis left ankle    Plan of Care:  1. Patient was evaluated. X-Rays reviewed.  2. Injection of 0.5 mLs Celestone Soluspan injected into the left ankle joint.  3. Prescription for Meloxicam provided to patient.  4. Ankle brace dispensed.  5. Return to clinic in 4 weeks.    Edrick Kins, DPM [facility]  Dr. Edrick Kins, [place]                                        [place], [place] [address]                Office [phone]  Fax [phone]

## 2018-05-26 DIAGNOSIS — M9914 Subluxation complex (vertebral) of sacral region: Secondary | ICD-10-CM | POA: Diagnosis not present

## 2018-05-26 DIAGNOSIS — M5136 Other intervertebral disc degeneration, lumbar region: Secondary | ICD-10-CM | POA: Diagnosis not present

## 2018-05-26 DIAGNOSIS — M9903 Segmental and somatic dysfunction of lumbar region: Secondary | ICD-10-CM | POA: Diagnosis not present

## 2018-05-26 DIAGNOSIS — M9905 Segmental and somatic dysfunction of pelvic region: Secondary | ICD-10-CM | POA: Diagnosis not present

## 2018-05-27 DIAGNOSIS — M5136 Other intervertebral disc degeneration, lumbar region: Secondary | ICD-10-CM | POA: Diagnosis not present

## 2018-05-27 DIAGNOSIS — M9914 Subluxation complex (vertebral) of sacral region: Secondary | ICD-10-CM | POA: Diagnosis not present

## 2018-05-27 DIAGNOSIS — M9905 Segmental and somatic dysfunction of pelvic region: Secondary | ICD-10-CM | POA: Diagnosis not present

## 2018-05-27 DIAGNOSIS — M9903 Segmental and somatic dysfunction of lumbar region: Secondary | ICD-10-CM | POA: Diagnosis not present

## 2018-05-29 DIAGNOSIS — M5136 Other intervertebral disc degeneration, lumbar region: Secondary | ICD-10-CM | POA: Diagnosis not present

## 2018-05-29 DIAGNOSIS — M9914 Subluxation complex (vertebral) of sacral region: Secondary | ICD-10-CM | POA: Diagnosis not present

## 2018-05-29 DIAGNOSIS — M9903 Segmental and somatic dysfunction of lumbar region: Secondary | ICD-10-CM | POA: Diagnosis not present

## 2018-05-29 DIAGNOSIS — M9905 Segmental and somatic dysfunction of pelvic region: Secondary | ICD-10-CM | POA: Diagnosis not present

## 2018-06-02 DIAGNOSIS — M9903 Segmental and somatic dysfunction of lumbar region: Secondary | ICD-10-CM | POA: Diagnosis not present

## 2018-06-02 DIAGNOSIS — M9905 Segmental and somatic dysfunction of pelvic region: Secondary | ICD-10-CM | POA: Diagnosis not present

## 2018-06-02 DIAGNOSIS — M5136 Other intervertebral disc degeneration, lumbar region: Secondary | ICD-10-CM | POA: Diagnosis not present

## 2018-06-02 DIAGNOSIS — M9914 Subluxation complex (vertebral) of sacral region: Secondary | ICD-10-CM | POA: Diagnosis not present

## 2018-06-04 DIAGNOSIS — M9903 Segmental and somatic dysfunction of lumbar region: Secondary | ICD-10-CM | POA: Diagnosis not present

## 2018-06-04 DIAGNOSIS — M5136 Other intervertebral disc degeneration, lumbar region: Secondary | ICD-10-CM | POA: Diagnosis not present

## 2018-06-04 DIAGNOSIS — M9914 Subluxation complex (vertebral) of sacral region: Secondary | ICD-10-CM | POA: Diagnosis not present

## 2018-06-04 DIAGNOSIS — M9905 Segmental and somatic dysfunction of pelvic region: Secondary | ICD-10-CM | POA: Diagnosis not present

## 2018-06-10 DIAGNOSIS — L810 Postinflammatory hyperpigmentation: Secondary | ICD-10-CM | POA: Diagnosis not present

## 2018-06-11 ENCOUNTER — Ambulatory Visit: Payer: Medicare HMO | Admitting: Podiatry

## 2018-06-13 IMAGING — CT NM PET TUM IMG INITIAL (PI) SKULL BASE T - THIGH
8 series · 25 of 25 positions shown · non-contrast
Comparison: CT neck 11/08/2017.

CLINICAL DATA: Initial treatment strategy for marginal zone B-cell
lymphoma in the left parotid gland with cervical lymphadenopathy.

EXAM:
NUCLEAR MEDICINE PET SKULL BASE TO THIGH
TECHNIQUE: 10.9 mCi F-18 FDG was injected intravenously. Full-ring PET imaging
was performed from the skull base to thigh after the radiotracer. CT
data was obtained and used for attenuation correction and anatomic
localization.
FASTING BLOOD GLUCOSE:  Value: 101 mg/dl

[Series 5: pet sk_thigh ac · axial · 5.0mm · 4.07mm/px · z∈[-891,-35]mm · 5 of 215 slices shown]
[im 1/215]
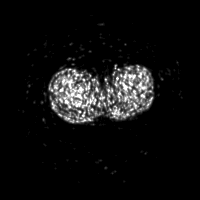
[im 54/215]
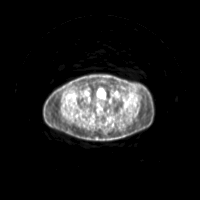
[im 108/215]
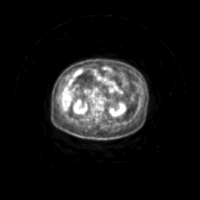
[im 161/215]
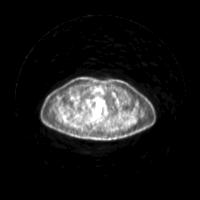
[im 215/215]
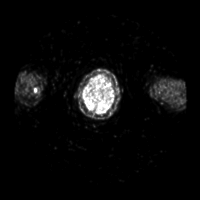

[Series 6: ct sk_thigh 5.0 b31f · axial · 5.0mm · 0.98mm/px · z∈[-891,-35]mm · 5 of 215 slices shown]
[im 1/215]
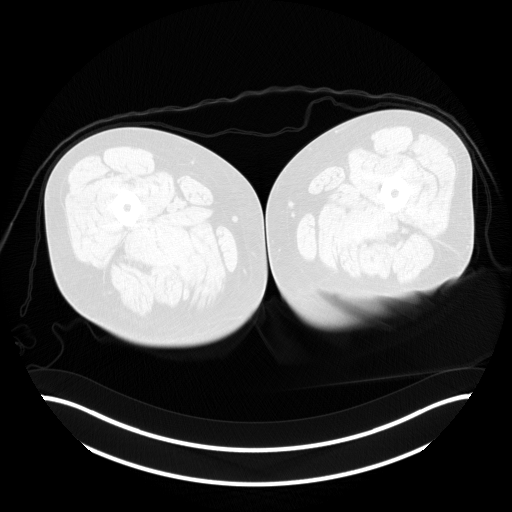
[im 54/215]
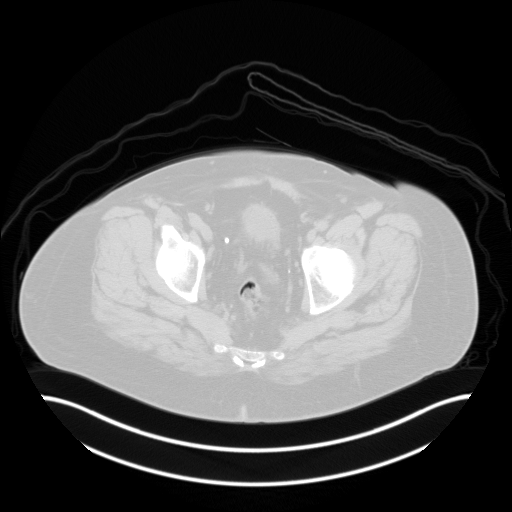
[im 108/215]
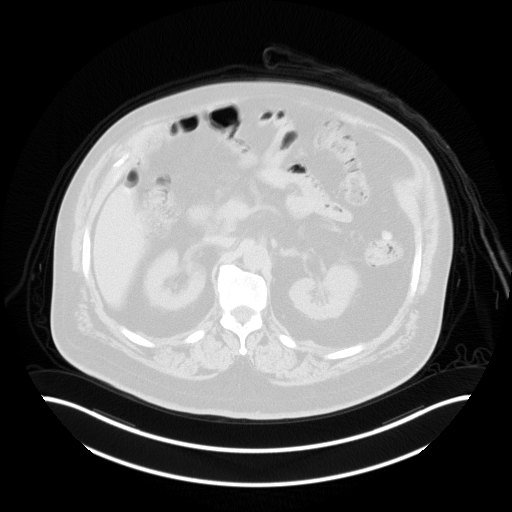
[im 161/215]
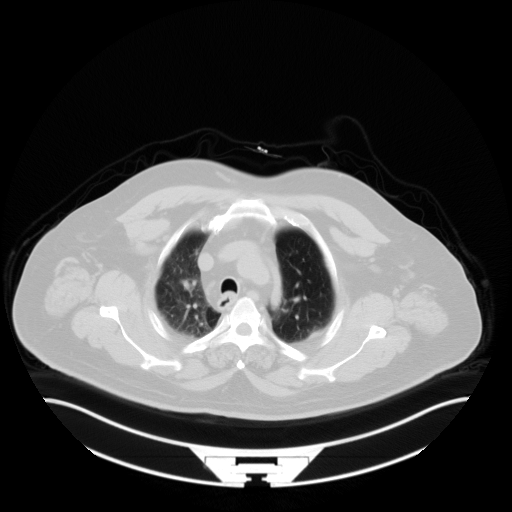
[im 215/215  brain]
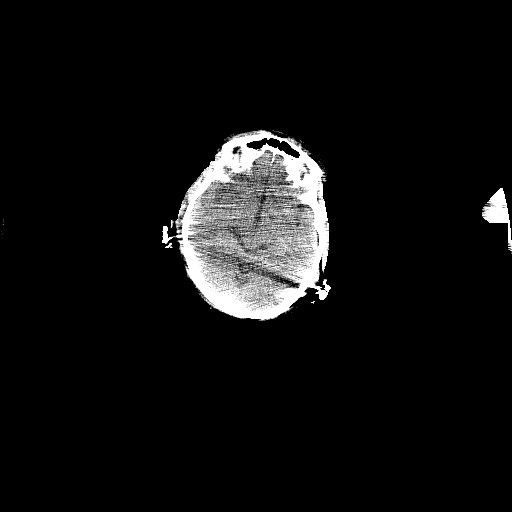

[Series 7: pet sk_thigh nac · axial · 5.0mm · 4.07mm/px · z∈[-891,-35]mm · 5 of 215 slices shown]
[im 1/215]
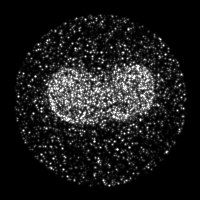
[im 54/215]
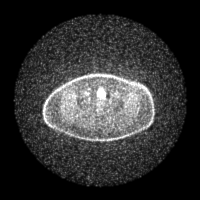
[im 108/215]
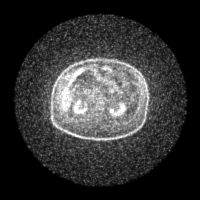
[im 161/215]
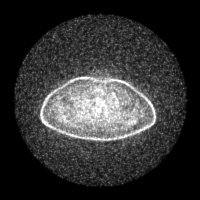
[im 215/215]
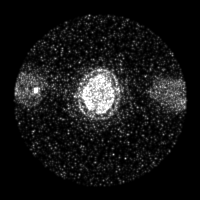

[Series 10: ct sk_thigh 5.0 b70f lung_bone · axial · 5.0mm · 0.68mm/px · 1 of 55 slices shown]
[im 1/55  bone]
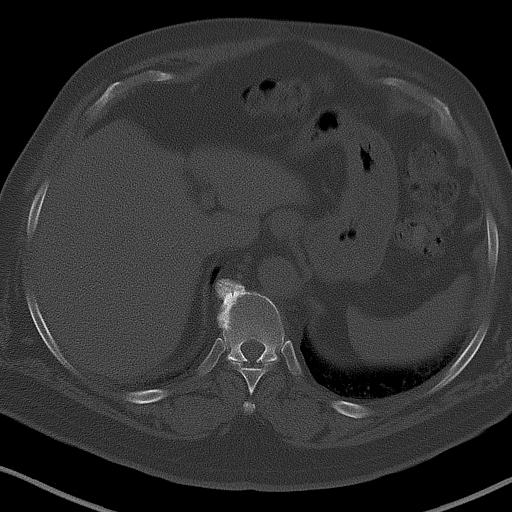

[Series 603: range-ct sk_thigh 5.0 (id)<alpha range> · 2 of 86 slices shown (1 of 2)]
[im 1/86]
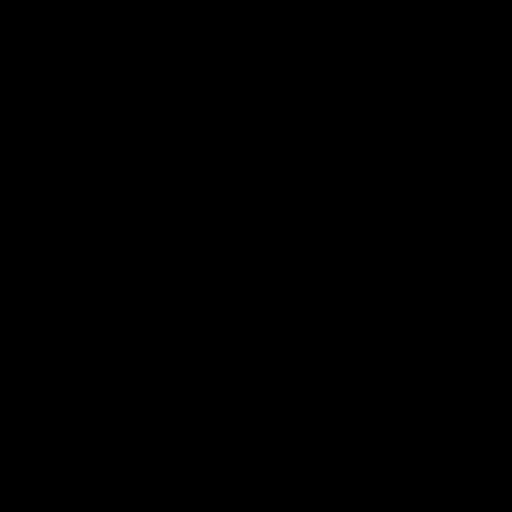
[im 86/86]
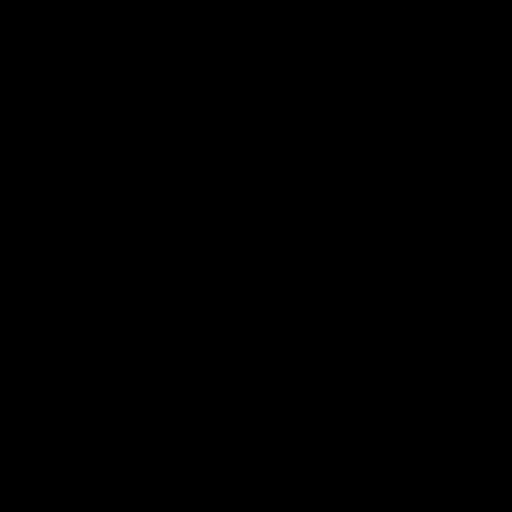

[Series 604: mip range 3 · coronal · 1.78mm/px · 1 of 32 slices shown]
[im 1/32]
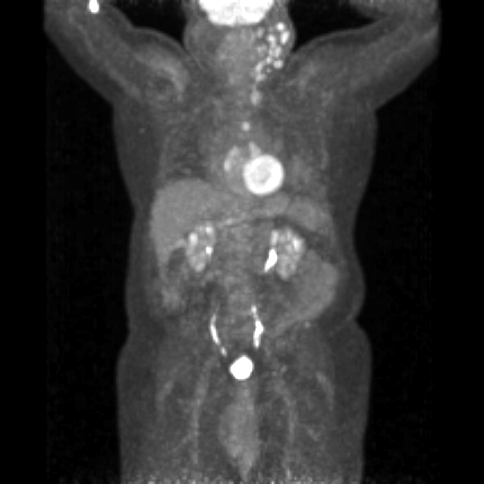

[Series 605: range-ct sk_thigh 5.0 (id)<alpha range> · 5 of 201 slices shown (2 of 2)]
[im 1/201]
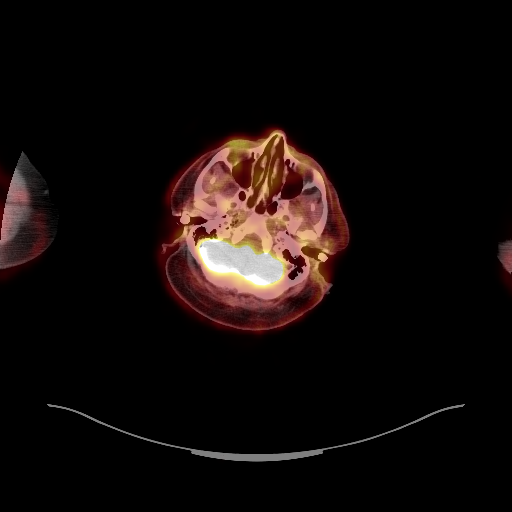
[im 51/201]
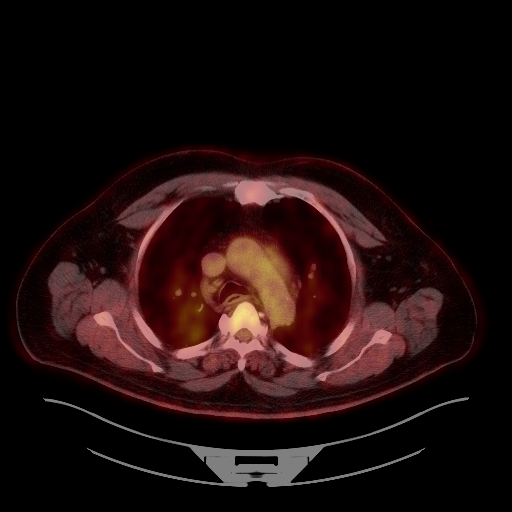
[im 101/201]
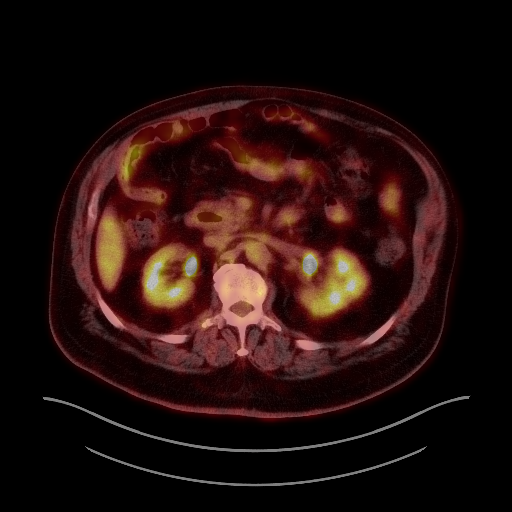
[im 151/201]
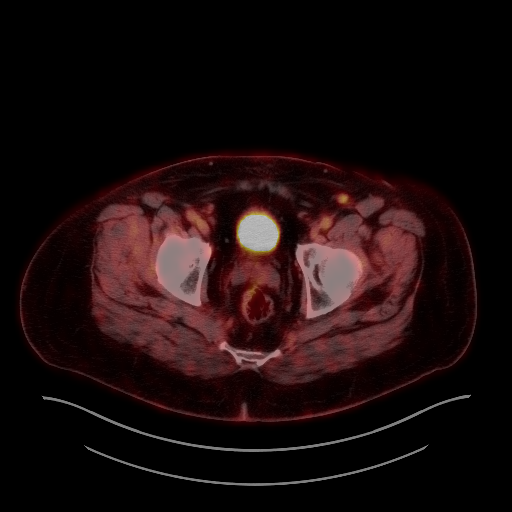
[im 201/201]
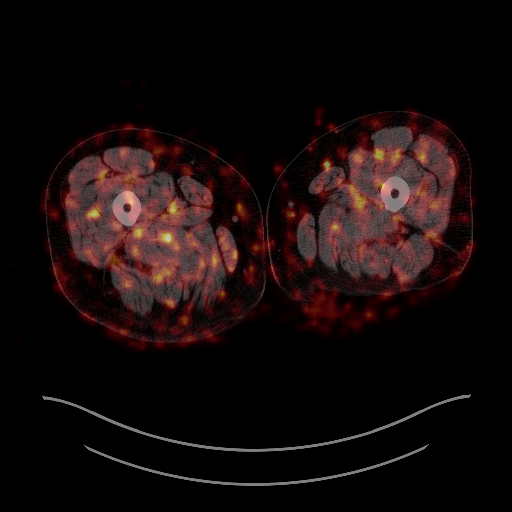

[Series 1077: results mm oncology reading · 5.0mm · 1.03mm/px · 1 of 7 slices shown]
[im 1/7]
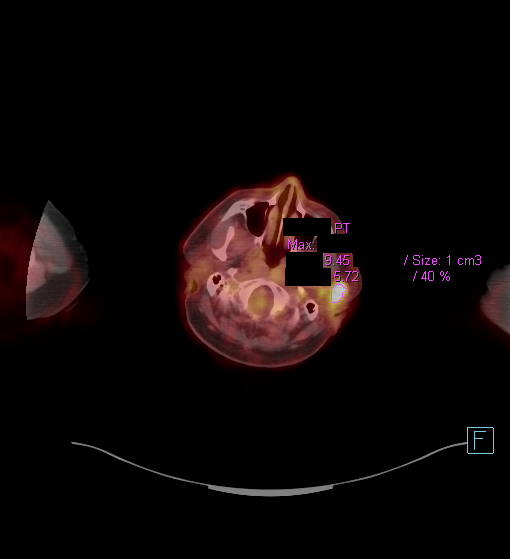

[25 of 25 positions shown; findings below may reference images not displayed]

FINDINGS: NECK

There are multiple hypermetabolic and mildly enlarged left cervical
lymph nodes. These include intraparotid lesions measuring up to 12
mm on image 13 (SUV max 14.3), a 12 mm level III node on image 36
(SUV max 8.6) and several level V nodes (SUV max 10.0). No
hypermetabolic or enlarged right-sided lymph nodes.There are no
lesions of the pharyngeal mucosal space.

CHEST

There are several hypermetabolic lymph nodes within the superior
mediastinum and subcarinal regions bilaterally. 13 mm right
infrahilar node on image 73 has an SUV max of 9.1. Left infrahilar
node measuring 11 mm on image 71 has an SUV max 8.2. No axillary or
hilar adenopathy. There is no suspicious pulmonary activity. Mild
emphysematous changes are present in the lungs with perihilar
atelectasis or scarring bilaterally.

ABDOMEN/PELVIS

There is no hypermetabolic activity within the liver, adrenal
glands, spleen or pancreas. There is no hypermetabolic nodal
activity within the abdomen or pelvis. Small left inguinal lymph
nodes are not pathologically enlarged but demonstrate mildly
prominent metabolic activity (SUV max 3.2). Similar to blood pool
activity (SUV 3.4). Sigmoid diverticulosis and mild aortic and
branch vessel atherosclerosis noted.

SKELETON

There is no hypermetabolic activity to suggest osseous metastatic
disease. Postsurgical changes in the lumbar spine. Activity in the
right antecubital fossa attributed to the injection.
IMPRESSION: 1. Hypermetabolic lymphoma appears confined to left cervical and
mediastinal lymph nodes.
2. Small left inguinal lymph nodes demonstrate nonspecific low-level
activity, likely reactive. Splenic size and metabolic activity are
normal.
3. Incidental findings including colonic diverticulosis and mild
Aortic Atherosclerosis ([id number]).

## 2018-06-16 ENCOUNTER — Other Ambulatory Visit: Payer: Self-pay | Admitting: Family Medicine

## 2018-06-16 NOTE — Telephone Encounter (Signed)
Gabapentin refill Last Refill:04/14/18 #90 Last OV: 05/09/18 with Waverly Ferrari PCP: Dr. Nolon Rod

## 2018-06-17 DIAGNOSIS — M9903 Segmental and somatic dysfunction of lumbar region: Secondary | ICD-10-CM | POA: Diagnosis not present

## 2018-06-17 DIAGNOSIS — M9905 Segmental and somatic dysfunction of pelvic region: Secondary | ICD-10-CM | POA: Diagnosis not present

## 2018-06-17 DIAGNOSIS — M9914 Subluxation complex (vertebral) of sacral region: Secondary | ICD-10-CM | POA: Diagnosis not present

## 2018-06-17 DIAGNOSIS — M5136 Other intervertebral disc degeneration, lumbar region: Secondary | ICD-10-CM | POA: Diagnosis not present

## 2018-06-17 NOTE — Telephone Encounter (Signed)
Refill request for gabapentin 300 mg #[id number] no refills approved per [facility].  Will send to scheduling pool to call pt and schedule f/u appt with stallings in November. Dgaddy, CMA

## 2018-06-23 DIAGNOSIS — M9914 Subluxation complex (vertebral) of sacral region: Secondary | ICD-10-CM | POA: Diagnosis not present

## 2018-06-23 DIAGNOSIS — M9903 Segmental and somatic dysfunction of lumbar region: Secondary | ICD-10-CM | POA: Diagnosis not present

## 2018-06-23 DIAGNOSIS — M9905 Segmental and somatic dysfunction of pelvic region: Secondary | ICD-10-CM | POA: Diagnosis not present

## 2018-06-23 DIAGNOSIS — M5136 Other intervertebral disc degeneration, lumbar region: Secondary | ICD-10-CM | POA: Diagnosis not present

## 2018-06-30 DIAGNOSIS — M9914 Subluxation complex (vertebral) of sacral region: Secondary | ICD-10-CM | POA: Diagnosis not present

## 2018-06-30 DIAGNOSIS — M5136 Other intervertebral disc degeneration, lumbar region: Secondary | ICD-10-CM | POA: Diagnosis not present

## 2018-06-30 DIAGNOSIS — M9903 Segmental and somatic dysfunction of lumbar region: Secondary | ICD-10-CM | POA: Diagnosis not present

## 2018-06-30 DIAGNOSIS — M9905 Segmental and somatic dysfunction of pelvic region: Secondary | ICD-10-CM | POA: Diagnosis not present

## 2018-07-03 DIAGNOSIS — M9905 Segmental and somatic dysfunction of pelvic region: Secondary | ICD-10-CM | POA: Diagnosis not present

## 2018-07-03 DIAGNOSIS — M5136 Other intervertebral disc degeneration, lumbar region: Secondary | ICD-10-CM | POA: Diagnosis not present

## 2018-07-03 DIAGNOSIS — M9903 Segmental and somatic dysfunction of lumbar region: Secondary | ICD-10-CM | POA: Diagnosis not present

## 2018-07-03 DIAGNOSIS — M9914 Subluxation complex (vertebral) of sacral region: Secondary | ICD-10-CM | POA: Diagnosis not present

## 2018-07-09 ENCOUNTER — Ambulatory Visit: Payer: Medicare HMO | Admitting: Family Medicine

## 2018-07-21 DIAGNOSIS — M47816 Spondylosis without myelopathy or radiculopathy, lumbar region: Secondary | ICD-10-CM | POA: Diagnosis not present

## 2018-07-22 DIAGNOSIS — M545 Low back pain: Secondary | ICD-10-CM | POA: Diagnosis not present

## 2018-07-22 DIAGNOSIS — M25552 Pain in left hip: Secondary | ICD-10-CM | POA: Diagnosis not present

## 2018-07-22 DIAGNOSIS — M25551 Pain in right hip: Secondary | ICD-10-CM | POA: Diagnosis not present

## 2018-07-29 ENCOUNTER — Observation Stay (HOSPITAL_COMMUNITY)
Admission: EM | Admit: 2018-07-29 | Discharge: 2018-07-30 | Disposition: A | Discharge status: Home / Self Care | Payer: Medicare HMO | Attending: Internal Medicine | Admitting: Internal Medicine

## 2018-07-29 ENCOUNTER — Emergency Department (HOSPITAL_COMMUNITY): Payer: Medicare HMO

## 2018-07-29 ENCOUNTER — Encounter (HOSPITAL_COMMUNITY): Payer: Self-pay | Admitting: Emergency Medicine

## 2018-07-29 DIAGNOSIS — Z859 Personal history of malignant neoplasm, unspecified: Secondary | ICD-10-CM | POA: Insufficient documentation

## 2018-07-29 DIAGNOSIS — I10 Essential (primary) hypertension: Secondary | ICD-10-CM | POA: Diagnosis not present

## 2018-07-29 DIAGNOSIS — R42 Dizziness and giddiness: Secondary | ICD-10-CM | POA: Diagnosis not present

## 2018-07-29 DIAGNOSIS — Z79899 Other long term (current) drug therapy: Secondary | ICD-10-CM | POA: Insufficient documentation

## 2018-07-29 DIAGNOSIS — R7989 Other specified abnormal findings of blood chemistry: Secondary | ICD-10-CM

## 2018-07-29 DIAGNOSIS — R0602 Shortness of breath: Secondary | ICD-10-CM | POA: Insufficient documentation

## 2018-07-29 DIAGNOSIS — Z87891 Personal history of nicotine dependence: Secondary | ICD-10-CM | POA: Insufficient documentation

## 2018-07-29 DIAGNOSIS — G4733 Obstructive sleep apnea (adult) (pediatric): Secondary | ICD-10-CM | POA: Insufficient documentation

## 2018-07-29 DIAGNOSIS — R778 Other specified abnormalities of plasma proteins: Secondary | ICD-10-CM

## 2018-07-29 DIAGNOSIS — R079 Chest pain, unspecified: Secondary | ICD-10-CM

## 2018-07-29 DIAGNOSIS — R350 Frequency of micturition: Secondary | ICD-10-CM | POA: Diagnosis not present

## 2018-07-29 DIAGNOSIS — I200 Unstable angina: Secondary | ICD-10-CM | POA: Diagnosis not present

## 2018-07-29 DIAGNOSIS — R110 Nausea: Secondary | ICD-10-CM | POA: Diagnosis not present

## 2018-07-29 DIAGNOSIS — C8580 Other specified types of non-Hodgkin lymphoma, unspecified site: Secondary | ICD-10-CM | POA: Diagnosis present

## 2018-07-29 DIAGNOSIS — R0789 Other chest pain: Secondary | ICD-10-CM | POA: Diagnosis not present

## 2018-07-29 DIAGNOSIS — R51 Headache: Secondary | ICD-10-CM | POA: Diagnosis not present

## 2018-07-29 DIAGNOSIS — R072 Precordial pain: Secondary | ICD-10-CM | POA: Diagnosis present

## 2018-07-29 DIAGNOSIS — R270 Ataxia, unspecified: Secondary | ICD-10-CM | POA: Insufficient documentation

## 2018-07-29 DIAGNOSIS — I2510 Atherosclerotic heart disease of native coronary artery without angina pectoris: Secondary | ICD-10-CM | POA: Insufficient documentation

## 2018-07-29 DIAGNOSIS — M545 Low back pain: Secondary | ICD-10-CM | POA: Diagnosis not present

## 2018-07-29 LAB — URINALYSIS, ROUTINE W REFLEX MICROSCOPIC
Bilirubin Urine: NEGATIVE
Glucose, UA: NEGATIVE mg/dL
Hgb urine dipstick: NEGATIVE
KETONES UR: NEGATIVE mg/dL
Leukocytes, UA: NEGATIVE
Nitrite: NEGATIVE
PROTEIN: NEGATIVE mg/dL
SPECIFIC GRAVITY, URINE: 1.006 (ref 1.005–1.030)
pH: 6 (ref 5.0–8.0)

## 2018-07-29 LAB — CBC WITH DIFFERENTIAL/PLATELET
Basophils Absolute: 0 10*3/uL (ref 0.0–0.1)
Basophils Relative: 0 %
Eosinophils Absolute: 0.1 10*3/uL (ref 0.0–0.7)
Eosinophils Relative: 1 %
HEMATOCRIT: 43.5 % (ref 39.0–52.0)
HEMOGLOBIN: 14.5 g/dL (ref 13.0–17.0)
LYMPHS PCT: 30 %
Lymphs Abs: 1.6 10*3/uL (ref 0.7–4.0)
MCH: 31.4 pg (ref 26.0–34.0)
MCHC: 33.3 g/dL (ref 30.0–36.0)
MCV: 94.2 fL (ref 78.0–100.0)
Monocytes Absolute: 0.5 10*3/uL (ref 0.1–1.0)
Monocytes Relative: 9 %
NEUTROS ABS: 3.2 10*3/uL (ref 1.7–7.7)
NEUTROS PCT: 60 %
Platelets: 235 10*3/uL (ref 150–400)
RBC: 4.62 MIL/uL (ref 4.22–5.81)
RDW: 14.4 % (ref 11.5–15.5)
WBC: 5.4 10*3/uL (ref 4.0–10.5)

## 2018-07-29 LAB — COMPREHENSIVE METABOLIC PANEL
ALT: 22 U/L (ref 0–44)
AST: 32 U/L (ref 15–41)
Albumin: 4 g/dL (ref 3.5–5.0)
Alkaline Phosphatase: 125 U/L (ref 38–126)
Anion gap: 9 (ref 5–15)
BILIRUBIN TOTAL: 0.7 mg/dL (ref 0.3–1.2)
BUN: 22 mg/dL (ref 8–23)
CALCIUM: 9 mg/dL (ref 8.9–10.3)
CHLORIDE: 101 mmol/L (ref 98–111)
CO2: 28 mmol/L (ref 22–32)
Creatinine, Ser: 1.25 mg/dL — ABNORMAL HIGH (ref 0.61–1.24)
GFR, EST NON AFRICAN AMERICAN: 55 mL/min — AB (ref >60)
Glucose, Bld: 70 mg/dL (ref 70–99)
Potassium: 4.3 mmol/L (ref 3.5–5.1)
Sodium: 138 mmol/L (ref 135–145)
TOTAL PROTEIN: 7.6 g/dL (ref 6.5–8.1)

## 2018-07-29 LAB — TROPONIN I: TROPONIN I: 0.06 ng/mL — AB (ref <0.03)

## 2018-07-29 MED ORDER — MECLIZINE HCL 25 MG PO TABS
25.0000 mg | ORAL_TABLET | Freq: Once | ORAL | Status: AC
  Administered 2018-07-29: 25 mg via ORAL
  Filled 2018-07-29: qty 1

## 2018-07-29 MED ORDER — TAMSULOSIN HCL 0.4 MG PO CAPS
0.4000 mg | ORAL_CAPSULE | Freq: Once | ORAL | Status: AC
  Administered 2018-07-29: 0.4 mg via ORAL
  Filled 2018-07-29: qty 1

## 2018-07-29 MED ORDER — PRIMIDONE 50 MG PO TABS
50.0000 mg | ORAL_TABLET | Freq: Once | ORAL | Status: AC
  Administered 2018-07-29: 50 mg via ORAL
  Filled 2018-07-29: qty 1

## 2018-07-29 MED ORDER — ASPIRIN 81 MG PO CHEW
324.0000 mg | CHEWABLE_TABLET | Freq: Once | ORAL | Status: AC
  Administered 2018-07-29: 324 mg via ORAL
  Filled 2018-07-29: qty 4

## 2018-07-29 MED ORDER — SODIUM CHLORIDE 0.9 % IV BOLUS
1000.0000 mL | Freq: Once | INTRAVENOUS | Status: AC
  Administered 2018-07-29: 1000 mL via INTRAVENOUS

## 2018-07-29 MED ORDER — GABAPENTIN 300 MG PO CAPS
300.0000 mg | ORAL_CAPSULE | Freq: Once | ORAL | Status: AC
  Administered 2018-07-29: 300 mg via ORAL
  Filled 2018-07-29: qty 1

## 2018-07-29 NOTE — ED Notes (Signed)
Pt remains out of room in MRI at this time 

## 2018-07-29 NOTE — ED Notes (Signed)
ED Provider at bedside. 

## 2018-07-29 NOTE — ED Triage Notes (Signed)
Pt reports waking up today dizzy and nauseous. Went to UC and was told to go to ED.

## 2018-07-29 NOTE — Discharge Instructions (Addendum)
Make sure you are staying well hydrated with water. This is very important.  Follow-up with your primary care doctor for further evaluation your symptoms and follow up on your heart value.  Return to the emergency room if you develop chest pain, shortness of breath, nausea, vomiting, worsening dizziness, or any new or worsening symptoms.

## 2018-07-29 NOTE — ED Provider Notes (Signed)
[facility] DEPT Provider Note   CSN: [id number] Arrival date & time: 07/29/18  1805     History   Chief Complaint Chief Complaint  Patient presents with  . Dizziness  . Nausea    HPI MARCK MCCLENNY is a 72 y.o. male presenting for evaluation of nausea and dizziness.  Patient states when he woke up this morning, he had acute nausea and dizziness.  Dizziness is described as feeling off balance, making him feel he is going to fall.  He denies room spinning.  Is worse when going from sitting to standing, not worse with movement of his head.  He reports nausea all morning, although this resolved prior to arrival.  Patient denies history of similar.  Reports a history of vertigo several years ago, but states this feels different.  He denies fall, trauma, or injury.  He states over the past 3 nights, he has been having increasing reflux symptoms/chest burning.  None currently.  He denies fevers, chills, cough, shortness of breath, vomiting, abdominal pain.  He reports urinary frequency, but no dysuria or hematuria.  He denies abnormal bowel movements.  He denies leg pain or swelling.  He denies numbness or tingling.  Patient states he has a history of lymphoma, diagnosed November of last year.  Additional history of hypertension, essential tremor, BPH, RA.  HPI  Past Medical History:  Diagnosis Date  . Anemia   . Arthritis    hands, knees, cervical area. Back pain. Rheumatoid arthritis- weekly injections.  [name] BPH (benign prostatic hypertrophy)   . Cancer ([place])   . Essential tremor   . GERD (gastroesophageal reflux disease)    reports for indigestion he uses mustard   . Hearing deficit    wears hearing aids bilateral  . HTN (hypertension)   . Obesity   . Refusal of blood transfusions as patient is Jehovah's Witness   . Right bundle branch block    history of  . Seizures (H[facility])    AS A CHILD. only esential tremors now.  . Sleep apnea    cpap - settings  at 9 per patient   . Ulcer     Patient Active Problem List   Diagnosis Date Noted  . Marginal zone lymphoma ([place]) 02/02/2018  . Lymphoma ([place]) 01/24/2018  . Parotid mass 10/28/2017  . Arthralgia of right lower leg 10/10/2017  . Class 2 severe obesity due to excess calories with serious comorbidity and body mass index (BMI) of 39.0 to 39.9 in adult ([place]) 03/07/2017  . ACE-inhibitor cough 03/07/2017  . Lumbago 06/21/2016  . Spinal stenosis, lumbar region, with neurogenic claudication 02/15/2015  . Obesity (BMI 30-39.9) 12/07/2013  . Refusal of blood transfusions as patient is Jehovah's Witness 12/07/2013  . Rheumatoid arthritis ([place]) 10/26/2012  . Nonspecific abnormal finding in stool contents 07/28/2012  . Multiple joint pain 06/05/2012  . History of tobacco use-  06/05/2012  . Asthmatic bronchitis 12/15/2011  . BPH (benign prostatic hyperplasia) 12/15/2011  . Essential tremor 12/15/2011  . Hypertension 12/15/2011    Past Surgical History:  Procedure Laterality Date  . ANTERIOR CERVICAL DECOMP/DISCECTOMY FUSION N/A 03/30/2013   Procedure: ANTERIOR CERVICAL DECOMPRESSION/DISCECTOMY FUSION 2 LEVELS;  Surgeon: Otilio Connors, MD;  Location: [facility];  Service: Neurosurgery;  Laterality: N/A;  C4-5 C5-6 Anterior cervical decompression/diskectomy/fusion/Allograft/Plate  . CHOLECYSTECTOMY N/A 12/07/2013   Procedure: LAPAROSCOPIC CHOLECYSTECTOMY WITH INTRAOPERATIVE CHOLANGIOGRAM;  Surgeon: Adin Hector, MD;  Location: [facility];  Service: General;  Laterality: N/A;  .  COLONOSCOPY    . DECOMPRESSIVE LUMBAR LAMINECTOMY LEVEL 2 N/A 02/15/2015   Procedure: COMPLETE DECOMPRESSIVE LUMBAR LAMINECTOMY L4-L5/ FORAMINOTOMY TO L4 NERVE ROOT AND L5 NERVE ROOT BILATERALLY;  Surgeon: [name], MD;  Location: [facility];  Service: Orthopedics;  Laterality: N/A;  . EYE SURGERY     right, growth excision  . LUMBAR LAMINECTOMY/DECOMPRESSION MICRODISCECTOMY Left 01/18/2016   Procedure:  DECOMPRESSION  L4-L5 MICRODISCECTOMY L4-L5 ON LEFT FOR SPINAL STENOSIS;  Surgeon: [name], MD;  Location: [facility];  Service: Orthopedics;  Laterality: Left;  . PAROTIDECTOMY Left 01/24/2018   Procedure: INCISIONAL BIOPSY OF LEFT PAROTID    MASS;  Surgeon: Helayne Seminole, MD;  Location: [facility];  Service: ENT;  Laterality: Left;  . SPINE SURGERY    . TONSILLECTOMY    . VASECTOMY    . WRIST GANGLION EXCISION Left         Home Medications    Prior to Admission medications   Medication Sig Start Date End Date Taking? Authorizing Provider  diclofenac (VOLTAREN) 75 MG EC tablet Take 75 mg by mouth 3 (three) times daily. 10/04/17  Yes [provider]  gabapentin (NEURONTIN) 300 MG capsule TAKE 1 CAPSULE BY MOUTH THREE TIMES DAILY 06/17/18  Yes Stallings, Zoe A, MD  hydrochlorothiazide (HYDRODIURIL) 25 MG tablet TAKE 25 MG BY MOUTH ONCE DAILY 02/24/18  Yes Stallings, Zoe A, MD  meloxicam (MOBIC) 15 MG tablet Take 15 mg by mouth daily. 07/11/18  Yes [provider]  metoprolol succinate (TOPROL-XL) 50 MG 24 hr tablet TAKE 1 TABLET BY MOUTH ONCE DAILY. TAKE WITH OR IMMEDIATELY FOLLOWING A MEAL Patient taking differently: Take 50 mg by mouth daily.  06/26/17  Yes [name], MD  primidone (MYSOLINE) 50 MG tablet Take 50-100 mg by mouth. 100 mg in the and 50 mg in the evening   Yes [provider]  Tamsulosin HCl (FLOMAX) 0.4 MG CAPS Take 0.4 mg by mouth at bedtime.    Yes [provider]  traMADol (ULTRAM) 50 MG tablet Take 1 tablet (50 mg total) every 8 (eight) hours as needed by mouth for severe pain. 09/27/17  Yes Harrison Mons, PA    Family History Family History  Problem Relation Age of Onset  . Colon cancer Maternal Grandfather   . Kidney disease Brother   . Stroke Maternal Grandmother   . Diabetes Paternal Aunt        x 4 aunts    Social History Social History   Tobacco Use  . Smoking status: Former Smoker    Types: Cigarettes    Last attempt to  quit: 12/14/1972    Years since quitting: 45.6  . Smokeless tobacco: Never Used  Substance Use Topics  . Alcohol use: Yes    Comment: occasionally  . Drug use: No     Allergies   Lisinopril and Other   Review of Systems Review of Systems  Cardiovascular: Positive for chest pain (Intermittent chest burning, described as reflux).  Gastrointestinal: Positive for nausea (Resolved).  Neurological: Positive for dizziness.  All other systems reviewed and are negative.    Physical Exam Updated Vital Signs BP (!) 204/92   Pulse 76   Temp 98.4 F (36.9 C) (Oral)   Resp 16   Ht 5\' 5"  (1.651 m)   Wt 103.9 kg   SpO2 91%   BMI 38.11 kg/m   Physical Exam  Constitutional: He is oriented to person, place, and time. He appears well-developed and well-nourished. No  distress.  Elderly male resting comfortably in the bed in no acute distress  HENT:  Head: Normocephalic and atraumatic.  No obvious head injury.  OP clear without tonsillar swelling or exudate.  Uvula midline with equal palate rise.  TMs nonerythematous and nonbulging bilaterally.  No nasal mucosal edema.  Eyes: Pupils are equal, round, and reactive to light. Conjunctivae and EOM are normal.  EOMI and PERRLA.  No nystagmus.  Neck: Normal range of motion. Neck supple.  Cardiovascular: Normal rate, regular rhythm and intact distal pulses.  Pulmonary/Chest: Effort normal and breath sounds normal. No respiratory distress. He has no wheezes.  Speaking in full sentences.  Clear lung sounds in all fields.  Abdominal: Soft. He exhibits no distension and no mass. There is no tenderness. There is no rebound and no guarding.  Musculoskeletal: Normal range of motion.  Strength intact x4.  Sensation intact x4.  Radial and pedal pulses intact bilaterally.  Soft compartments.  No edema.  Neurological: He is alert and oriented to person, place, and time. He has normal strength. No cranial nerve deficit or sensory deficit. Gait normal. GCS  eye subscore is 4. GCS verbal subscore is 5. GCS motor subscore is 6.  Alert and oriented.  Slight difficulty nose to finger on the left side, but able to perform.  Negative pronator drift.  Mild difficulty with Romberg, patient is ambulatory.  Grip strength intact bilaterally.  Sensation intact bilaterally.  CN intact  Skin: Skin is warm and dry. Capillary refill takes less than 2 seconds.  Psychiatric: He has a normal mood and affect.  Nursing note and vitals reviewed.    ED Treatments / Results  Labs (all labs ordered are listed, but only abnormal results are displayed) Labs Reviewed  COMPREHENSIVE METABOLIC PANEL - Abnormal; Notable for the following components:      Result Value   Creatinine, Ser 1.25 (*)    GFR calc non Af Amer 55 (*)    All other components within normal limits  URINALYSIS, ROUTINE W REFLEX MICROSCOPIC - Abnormal; Notable for the following components:   Color, Urine STRAW (*)    All other components within normal limits  TROPONIN I - Abnormal; Notable for the following components:   Troponin I 0.06 (*)    All other components within normal limits  URINE CULTURE  CBC WITH DIFFERENTIAL/PLATELET  TROPONIN I    EKG EKG Interpretation  Date/Time:  Tuesday July 29 2018 19:38:51 EDT Ventricular Rate:  61 PR Interval:    QRS Duration: 131 QT Interval:  431 QTC Calculation: 435 R Axis:   83 Text Interpretation:  Sinus rhythm Right bundle branch block No significant change since last tracing Confirmed by Isla Pence [phone]) on 07/29/2018 7:50:52 PM   Radiology Dg Chest 2 View  Result Date: 07/29/2018 CLINICAL DATA:  Dizziness and shortness of breath. Marginal zone B-cell lymphoma. EXAM: CHEST - 2 VIEW COMPARISON:  01/08/2018 FINDINGS: The heart size and mediastinal contours are within normal limits. Both lungs are clear. The visualized skeletal structures are unremarkable. IMPRESSION: No active cardiopulmonary disease. Electronically Signed   By: Earle Gell M.D.   On: 07/29/2018 20:43   Mr Brain Wo Contrast  Result Date: 07/29/2018 CLINICAL DATA:  Ataxia and possible stroke EXAM: MRI HEAD WITHOUT CONTRAST MRA HEAD WITHOUT CONTRAST TECHNIQUE: Multiplanar, multiecho pulse sequences of the brain and surrounding structures were obtained without intravenous contrast. Angiographic images of the head were obtained using MRA technique without contrast. COMPARISON:  None. FINDINGS: MRI  HEAD FINDINGS BRAIN: There is no acute infarct, acute hemorrhage or mass effect. The midline structures are normal. There are no old infarcts. Multifocal white matter hyperintensity, most commonly due to chronic ischemic microangiopathy. The CSF spaces are normal for age, with no hydrocephalus. Susceptibility-sensitive sequences show no chronic microhemorrhage or superficial siderosis. SKULL AND UPPER CERVICAL SPINE: The visualized skull base, calvarium, upper cervical spine and extracranial soft tissues are normal. SINUSES/ORBITS: No fluid levels or advanced mucosal thickening. No mastoid or middle ear effusion. The orbits are normal. MRA HEAD FINDINGS The MRA is degraded by motion. Intracranial internal carotid arteries: Normal. Anterior cerebral arteries: Normal. Middle cerebral arteries: Normal. Posterior communicating arteries: Present bilaterally. Posterior cerebral arteries: Normal. Basilar artery: Normal. Vertebral arteries: Left dominant. Normal. Superior cerebellar arteries: Normal. Inferior cerebellar arteries: Normal. IMPRESSION: 1. No acute intracranial abnormality. 2. Mild multifocal white matter hyperintensity, most commonly indicating chronic small vessel ischemia. 3. Motion degraded MRA without occlusion or high-grade stenosis. Electronically Signed   By: Ulyses Jarred M.D.   On: 07/29/2018 21:38   Mr Jodene Nam Head (cerebral Arteries)  Result Date: 07/29/2018 CLINICAL DATA:  Ataxia and possible stroke EXAM: MRI HEAD WITHOUT CONTRAST MRA HEAD WITHOUT CONTRAST TECHNIQUE:  Multiplanar, multiecho pulse sequences of the brain and surrounding structures were obtained without intravenous contrast. Angiographic images of the head were obtained using MRA technique without contrast. COMPARISON:  None. FINDINGS: MRI HEAD FINDINGS BRAIN: There is no acute infarct, acute hemorrhage or mass effect. The midline structures are normal. There are no old infarcts. Multifocal white matter hyperintensity, most commonly due to chronic ischemic microangiopathy. The CSF spaces are normal for age, with no hydrocephalus. Susceptibility-sensitive sequences show no chronic microhemorrhage or superficial siderosis. SKULL AND UPPER CERVICAL SPINE: The visualized skull base, calvarium, upper cervical spine and extracranial soft tissues are normal. SINUSES/ORBITS: No fluid levels or advanced mucosal thickening. No mastoid or middle ear effusion. The orbits are normal. MRA HEAD FINDINGS The MRA is degraded by motion. Intracranial internal carotid arteries: Normal. Anterior cerebral arteries: Normal. Middle cerebral arteries: Normal. Posterior communicating arteries: Present bilaterally. Posterior cerebral arteries: Normal. Basilar artery: Normal. Vertebral arteries: Left dominant. Normal. Superior cerebellar arteries: Normal. Inferior cerebellar arteries: Normal. IMPRESSION: 1. No acute intracranial abnormality. 2. Mild multifocal white matter hyperintensity, most commonly indicating chronic small vessel ischemia. 3. Motion degraded MRA without occlusion or high-grade stenosis. Electronically Signed   By: Ulyses Jarred M.D.   On: 07/29/2018 21:38    Procedures Procedures (including critical care time)  Medications Ordered in ED Medications  aspirin chewable tablet 324 mg (has no administration in time range)  primidone (MYSOLINE) tablet 50 mg (has no administration in time range)  tamsulosin (FLOMAX) capsule 0.4 mg (has no administration in time range)  gabapentin (NEURONTIN) capsule 300 mg (has no  administration in time range)  sodium chloride 0.9 % bolus 1,000 mL (0 mLs Intravenous Stopped 07/29/18 2131)  meclizine (ANTIVERT) tablet 25 mg (25 mg Oral Given 07/29/18 1951)     Initial Impression / Assessment and Plan / ED Course  I have reviewed the triage vital signs and the nursing notes.  Pertinent labs & imaging results that were available during my care of the patient were reviewed by me and considered in my medical decision making (see chart for details).     Presenting for evaluation of dizziness and nausea.  Physical exam shows an elderly male in no acute distress.  He is afebrile not tachycardic.  Appears nontoxic.  Neurologic exam shows mild  difficulty with nose to finger the left side, difficulty with Romberg.  However gait is not ataxic.  Will obtain MRI/MRA for further evaluation, specially considering patient's history of lymphoma.  As patient has been having increasing heartburn/chest burning, will obtain troponin.  She is concerned symptoms are due to his sleep apnea machine, will obtain chest x-ray to rule out infection.  Basic labs ordered.  Will give fluids and meclizine for symptom control.  Case discussed with attending, Dr. Gilford Raid evaluated the patient.   EKG without changes.  No leukocytosis.  Creatinine 1.25, this is baseline per review.  UA without infection.  Chest x-ray viewed interpreted by me, no pneumonia, pneumothorax, effusions, or cardiomegaly.  MRI/MRI negative.  Informed by RN that trip is positive at 0.06.  No recent troponin to compare.  Patient remains chest pain-free.  Discussed with attending, Dr. Gilford Raid agrees to plan. Possibly increased due to CKD. Will give ASA and order delta trop. If increases or pt has CP, consider cards consult/admit. If improves or remains stable, consider d/c with f/u OP. Blood pressure elevated after MRI, patient states he became anxious while at MRI.  Has improved while blood pressure elevated, no hypertension initially.   Doubt hypertensive emergency.  Pt signed out to Marval Regal, PA-C for follow up on trop and final discharge.    Final Clinical Impressions(s) / ED Diagnoses   Final diagnoses:  None    ED Discharge Orders    None       Franchot Heidelberg, PA-C 07/29/18 2308    Isla Pence, MD 07/29/18 2316

## 2018-07-29 NOTE — ED Provider Notes (Signed)
Bryan [name] is a 72 y.o. male, with a history of HTN, arthritis, and anemia, presenting to the ED with dizziness beginning morning of 9/17. Woke up with this feeling of dizziness that he describes as "a feeling like being drunk." Accompanied by chest pain, sharp, lower chest, 7/10, nonradiating, lasted for about an hour.  He states, "I have had these episodes of chest pain on and off for the last year, sometimes when I am walking and sometimes when I am just sitting.  I have not talked to the doctor about them because they did not stay." Patient states he had an episode of chest pain lasting 10 to 15 minutes after returning from his MRI.  HPI from Franchot Heidelberg, PA-C: "Bryan [name] is a 72 y.o. male presenting for evaluation of nausea and dizziness.  Patient states when he woke up this morning, he had acute nausea and dizziness.  Dizziness is described as feeling off balance, making him feel he is going to fall.  He denies room spinning.  Is worse when going from sitting to standing, not worse with movement of his head.  He reports nausea all morning, although this resolved prior to arrival.  Patient denies history of similar.  Reports a history of vertigo several years ago, but states this feels different.  He denies fall, trauma, or injury.  He states over the past 3 nights, he has been having increasing reflux symptoms/chest burning.  None currently.  He denies fevers, chills, cough, shortness of breath, vomiting, abdominal pain.  He reports urinary frequency, but no dysuria or hematuria.  He denies abnormal bowel movements.  He denies leg pain or swelling.  He denies numbness or tingling.  Patient states he has a history of lymphoma, diagnosed November of last year.  Additional history of hypertension, essential tremor, BPH, RA."  Past Medical History:  Diagnosis Date  . Anemia   . Arthritis    hands, knees, cervical area. Back pain. Rheumatoid arthritis- weekly injections.  [name] BPH (benign  prostatic hypertrophy)   . Cancer ([place])   . Essential tremor   . GERD (gastroesophageal reflux disease)    reports for indigestion he uses mustard   . Hearing deficit    wears hearing aids bilateral  . HTN (hypertension)   . Obesity   . Refusal of blood transfusions as patient is Jehovah's Witness   . Right bundle branch block    history of  . Seizures (HCC)    AS A CHILD. only esential tremors now.  . Sleep apnea    cpap - settings at 9 per patient   . Ulcer     Physical Exam  BP (!) 204/92   Pulse 76   Temp 98.4 F (36.9 C) (Oral)   Resp 16   Ht 5\' 5"  (1.651 m)   Wt 103.9 kg   SpO2 91%   BMI 38.11 kg/m   Physical Exam  Constitutional: He appears well-developed and well-nourished. No distress.  HENT:  Head: Normocephalic and atraumatic.  Eyes: Conjunctivae are normal.  Neck: Neck supple.  Cardiovascular: Normal rate, regular rhythm, normal heart sounds and intact distal pulses.  Pulmonary/Chest: Effort normal and breath sounds normal. No respiratory distress.  Abdominal: Soft. There is no tenderness. There is no guarding.  Musculoskeletal: He exhibits no edema.  Lymphadenopathy:    He has no cervical adenopathy.  Neurological: He is alert.  Skin: Skin is warm and dry. He is not diaphoretic.  Psychiatric: He has a normal  mood and affect. His behavior is normal.  Nursing note and vitals reviewed.   ED Course/Procedures     .Critical Care Performed by: Lorayne Bender, PA-C Authorized by: Lorayne Bender, PA-C   Critical care provider statement:    Critical care time (minutes):  35   Critical care time was exclusive of:  Separately billable procedures and treating other patients   Critical care was necessary to treat or prevent imminent or life-threatening deterioration of the following conditions: Unstable angina.   Critical care was time spent personally by me on the following activities:  Development of treatment plan with patient or surrogate, discussions with  consultants, evaluation of patient's response to treatment, examination of patient, obtaining history from patient or surrogate, review of old charts, re-evaluation of patient's condition, pulse oximetry and ordering and performing treatments and interventions       Abnormal Labs Reviewed  COMPREHENSIVE METABOLIC PANEL - Abnormal; Notable for the following components:      Result Value   Creatinine, Ser 1.25 (*)    GFR calc non Af Amer 55 (*)    All other components within normal limits  URINALYSIS, ROUTINE W REFLEX MICROSCOPIC - Abnormal; Notable for the following components:   Color, Urine STRAW (*)    All other components within normal limits  TROPONIN I - Abnormal; Notable for the following components:   Troponin I 0.06 (*)    All other components within normal limits  TROPONIN I - Abnormal; Notable for the following components:   Troponin I 0.07 (*)    All other components within normal limits   Dg Chest 2 View  Result Date: 07/29/2018 CLINICAL DATA:  Dizziness and shortness of breath. Marginal zone B-cell lymphoma. EXAM: CHEST - 2 VIEW COMPARISON:  01/08/2018 FINDINGS: The heart size and mediastinal contours are within normal limits. Both lungs are clear. The visualized skeletal structures are unremarkable. IMPRESSION: No active cardiopulmonary disease. Electronically Signed   By: Earle Gell M.D.   On: 07/29/2018 20:43   Bryan [name] Contrast  Result Date: 07/29/2018 CLINICAL DATA:  Ataxia and possible stroke EXAM: MRI HEAD WITHOUT CONTRAST MRA HEAD WITHOUT CONTRAST TECHNIQUE: Multiplanar, multiecho pulse sequences of the brain and surrounding structures were obtained without intravenous contrast. Angiographic images of the head were obtained using MRA technique without contrast. COMPARISON:  None. FINDINGS: MRI HEAD FINDINGS BRAIN: There is no acute infarct, acute hemorrhage or mass effect. The midline structures are normal. There are no old infarcts. Multifocal white matter  hyperintensity, most commonly due to chronic ischemic microangiopathy. The CSF spaces are normal for age, with no hydrocephalus. Susceptibility-sensitive sequences show no chronic microhemorrhage or superficial siderosis. SKULL AND UPPER CERVICAL SPINE: The visualized skull base, calvarium, upper cervical spine and extracranial soft tissues are normal. SINUSES/ORBITS: No fluid levels or advanced mucosal thickening. No mastoid or middle ear effusion. The orbits are normal. MRA HEAD FINDINGS The MRA is degraded by motion. Intracranial internal carotid arteries: Normal. Anterior cerebral arteries: Normal. Middle cerebral arteries: Normal. Posterior communicating arteries: Present bilaterally. Posterior cerebral arteries: Normal. Basilar artery: Normal. Vertebral arteries: Left dominant. Normal. Superior cerebellar arteries: Normal. Inferior cerebellar arteries: Normal. IMPRESSION: 1. No acute intracranial abnormality. 2. Mild multifocal white matter hyperintensity, most commonly indicating chronic small vessel ischemia. 3. Motion degraded MRA without occlusion or high-grade stenosis. Electronically Signed   By: Ulyses Jarred M.D.   On: 07/29/2018 21:38   Bryan Jodene Nam Head (cerebral Arteries)  Result Date: 07/29/2018 CLINICAL DATA:  Ataxia and  possible stroke EXAM: MRI HEAD WITHOUT CONTRAST MRA HEAD WITHOUT CONTRAST TECHNIQUE: Multiplanar, multiecho pulse sequences of the brain and surrounding structures were obtained without intravenous contrast. Angiographic images of the head were obtained using MRA technique without contrast. COMPARISON:  None. FINDINGS: MRI HEAD FINDINGS BRAIN: There is no acute infarct, acute hemorrhage or mass effect. The midline structures are normal. There are no old infarcts. Multifocal white matter hyperintensity, most commonly due to chronic ischemic microangiopathy. The CSF spaces are normal for age, with no hydrocephalus. Susceptibility-sensitive sequences show no chronic microhemorrhage or  superficial siderosis. SKULL AND UPPER CERVICAL SPINE: The visualized skull base, calvarium, upper cervical spine and extracranial soft tissues are normal. SINUSES/ORBITS: No fluid levels or advanced mucosal thickening. No mastoid or middle ear effusion. The orbits are normal. MRA HEAD FINDINGS The MRA is degraded by motion. Intracranial internal carotid arteries: Normal. Anterior cerebral arteries: Normal. Middle cerebral arteries: Normal. Posterior communicating arteries: Present bilaterally. Posterior cerebral arteries: Normal. Basilar artery: Normal. Vertebral arteries: Left dominant. Normal. Superior cerebellar arteries: Normal. Inferior cerebellar arteries: Normal. IMPRESSION: 1. No acute intracranial abnormality. 2. Mild multifocal white matter hyperintensity, most commonly indicating chronic small vessel ischemia. 3. Motion degraded MRA without occlusion or high-grade stenosis. Electronically Signed   By: Ulyses Jarred M.D.   On: 07/29/2018 21:38     EKG Interpretation  Date/Time:  Tuesday July 29 2018 19:38:51 EDT Ventricular Rate:  61 PR Interval:    QRS Duration: 131 QT Interval:  431 QTC Calculation: 435 R Axis:   83 Text Interpretation:  Sinus rhythm Right bundle branch block No significant change since last tracing Confirmed by Isla Pence [phone]) on 07/29/2018 7:50:52 PM       EKG Interpretation  Date/Time:  Wednesday July 30 2018 00:53:31 EDT Ventricular Rate:  60 PR Interval:    QRS Duration: 138 QT Interval:  433 QTC Calculation: 433 R Axis:   61 Text Interpretation:  Sinus rhythm Atrial premature complex Right bundle branch block No significant change was found Confirmed by Shanon Rosser [phone]) on 07/30/2018 1:04:37 AM       MDM   Clinical Course as of Jul 30 248  Wed Jul 30, 2018  0108 Consistent with previous values.  Creatinine(!): 1.25 [SJ]  0122 Spoke with Dr. Hassell Done, cardiology fellow.  Requests patient be transferred to [facility], admitted  via medicine service. Start heparin for unstable angina.    [SJ]  0136 Spoke with Dr. Alcario Drought, hospitalist. Will admit patient over at [facility]   [SJ]    Clinical Course User Index [SJ] Lorayne Bender, PA-C   Patient care handoff report received from [facility], PA-C.  Patient presents with dizziness and chest pain.  No acute abnormality on MRI.  With recurrence of the patient's chest pain here in the ED, concern for unstable angina.  Though the patient has a technically positive troponin, it had no significant increase and therefore my suspicion for NSTEMI is lower.  Transfer to [facility] with admission via medicine service and cardiology continuing to consult.   Findings and plan of care discussed with Dr. Florina Ou.  Vitals:   07/29/18 1900 07/29/18 2125 07/29/18 2130 07/29/18 2200  BP: (!) 146/90 (!) 173/88 (!) 192/95 (!) 204/92  Pulse: 67 (!) 58 61 76  Resp: 17 16 20 16   Temp:      TempSrc:      SpO2: 99% 100% 100% 91%  Weight:      Height:  Vitals:   07/30/18 0115 07/30/18 0130 07/30/18 0145 07/30/18 0200  BP: (!) 165/78 (!) 152/80 (!) 167/85 (!) 156/87  Pulse: 60 (!) 57 (!) 58 (!) 59  Resp: 16 17 (!) 21 17  Temp:      TempSrc:      SpO2: 97% 100% 100% 100%  Weight:      Height:             Lorayne Bender, PA-C 07/30/18 0253    Shanon Rosser, MD 07/30/18 [phone]

## 2018-07-30 ENCOUNTER — Observation Stay (HOSPITAL_COMMUNITY): Payer: Medicare HMO

## 2018-07-30 ENCOUNTER — Other Ambulatory Visit: Payer: Self-pay

## 2018-07-30 ENCOUNTER — Other Ambulatory Visit: Payer: Self-pay | Admitting: Family Medicine

## 2018-07-30 ENCOUNTER — Ambulatory Visit: Payer: Medicare HMO | Admitting: Urgent Care

## 2018-07-30 ENCOUNTER — Observation Stay (HOSPITAL_BASED_OUTPATIENT_CLINIC_OR_DEPARTMENT_OTHER): Payer: Medicare HMO

## 2018-07-30 ENCOUNTER — Encounter (HOSPITAL_COMMUNITY): Payer: Self-pay | Admitting: Cardiology

## 2018-07-30 DIAGNOSIS — R778 Other specified abnormalities of plasma proteins: Secondary | ICD-10-CM

## 2018-07-30 DIAGNOSIS — I351 Nonrheumatic aortic (valve) insufficiency: Secondary | ICD-10-CM | POA: Diagnosis not present

## 2018-07-30 DIAGNOSIS — R350 Frequency of micturition: Secondary | ICD-10-CM | POA: Diagnosis not present

## 2018-07-30 DIAGNOSIS — C8580 Other specified types of non-Hodgkin lymphoma, unspecified site: Secondary | ICD-10-CM

## 2018-07-30 DIAGNOSIS — R42 Dizziness and giddiness: Secondary | ICD-10-CM | POA: Diagnosis not present

## 2018-07-30 DIAGNOSIS — I200 Unstable angina: Secondary | ICD-10-CM

## 2018-07-30 DIAGNOSIS — R079 Chest pain, unspecified: Secondary | ICD-10-CM

## 2018-07-30 DIAGNOSIS — R110 Nausea: Secondary | ICD-10-CM | POA: Diagnosis not present

## 2018-07-30 DIAGNOSIS — I10 Essential (primary) hypertension: Secondary | ICD-10-CM | POA: Diagnosis not present

## 2018-07-30 DIAGNOSIS — G4733 Obstructive sleep apnea (adult) (pediatric): Secondary | ICD-10-CM | POA: Diagnosis not present

## 2018-07-30 DIAGNOSIS — R7989 Other specified abnormal findings of blood chemistry: Secondary | ICD-10-CM

## 2018-07-30 DIAGNOSIS — R748 Abnormal levels of other serum enzymes: Secondary | ICD-10-CM

## 2018-07-30 DIAGNOSIS — R072 Precordial pain: Secondary | ICD-10-CM | POA: Diagnosis present

## 2018-07-30 DIAGNOSIS — Z87891 Personal history of nicotine dependence: Secondary | ICD-10-CM | POA: Diagnosis not present

## 2018-07-30 DIAGNOSIS — R0602 Shortness of breath: Secondary | ICD-10-CM | POA: Diagnosis not present

## 2018-07-30 DIAGNOSIS — Z79899 Other long term (current) drug therapy: Secondary | ICD-10-CM | POA: Diagnosis not present

## 2018-07-30 DIAGNOSIS — Z859 Personal history of malignant neoplasm, unspecified: Secondary | ICD-10-CM | POA: Diagnosis not present

## 2018-07-30 DIAGNOSIS — R0789 Other chest pain: Secondary | ICD-10-CM | POA: Diagnosis not present

## 2018-07-30 LAB — CBG MONITORING, ED: GLUCOSE-CAPILLARY: 94 mg/dL (ref 70–99)

## 2018-07-30 LAB — CBC
HCT: 40.2 % (ref 39.0–52.0)
Hemoglobin: 13 g/dL (ref 13.0–17.0)
MCH: 30.3 pg (ref 26.0–34.0)
MCHC: 32.3 g/dL (ref 30.0–36.0)
MCV: 93.7 fL (ref 78.0–100.0)
PLATELETS: 205 10*3/uL (ref 150–400)
RBC: 4.29 MIL/uL (ref 4.22–5.81)
RDW: 14.3 % (ref 11.5–15.5)
WBC: 4.6 10*3/uL (ref 4.0–10.5)

## 2018-07-30 LAB — TROPONIN I
TROPONIN I: 0.07 ng/mL — AB (ref <0.03)
TROPONIN I: 0.05 ng/mL — AB (ref <0.03)
Troponin I: 0.04 ng/mL (ref <0.03)

## 2018-07-30 LAB — HEPARIN LEVEL (UNFRACTIONATED): Heparin Unfractionated: 0.66 IU/mL (ref 0.30–0.70)

## 2018-07-30 LAB — ECHOCARDIOGRAM COMPLETE
HEIGHTINCHES: 65 in
Weight: 3598.4 oz

## 2018-07-30 MED ORDER — HEPARIN BOLUS VIA INFUSION
4000.0000 [IU] | Freq: Once | INTRAVENOUS | Status: AC
  Administered 2018-07-30: 4000 [IU] via INTRAVENOUS
  Filled 2018-07-30: qty 4000

## 2018-07-30 MED ORDER — GABAPENTIN 300 MG PO CAPS
300.0000 mg | ORAL_CAPSULE | Freq: Three times a day (TID) | ORAL | Status: DC
  Administered 2018-07-30 (×2): 300 mg via ORAL
  Filled 2018-07-30 (×2): qty 1

## 2018-07-30 MED ORDER — IOPAMIDOL (ISOVUE-370) INJECTION 76%
100.0000 mL | Freq: Once | INTRAVENOUS | Status: AC
  Administered 2018-07-30: 80 mL via INTRAVENOUS

## 2018-07-30 MED ORDER — PRIMIDONE 50 MG PO TABS
100.0000 mg | ORAL_TABLET | Freq: Every day | ORAL | Status: DC
  Administered 2018-07-30: 100 mg via ORAL
  Filled 2018-07-30: qty 2

## 2018-07-30 MED ORDER — ASPIRIN EC 81 MG PO TBEC: 81.0000 mg | DELAYED_RELEASE_TABLET | Freq: Every day | ORAL | 0 refills | Status: DC

## 2018-07-30 MED ORDER — PRIMIDONE 50 MG PO TABS: 50.0000 mg | ORAL_TABLET | Freq: Two times a day (BID) | ORAL | Status: DC

## 2018-07-30 MED ORDER — TRAMADOL HCL 50 MG PO TABS
50.0000 mg | ORAL_TABLET | Freq: Three times a day (TID) | ORAL | Status: DC | PRN
  Administered 2018-07-30: 50 mg via ORAL
  Filled 2018-07-30: qty 1

## 2018-07-30 MED ORDER — ONDANSETRON HCL 4 MG/2ML IJ SOLN: 4.0000 mg | Freq: Four times a day (QID) | INTRAMUSCULAR | Status: DC | PRN

## 2018-07-30 MED ORDER — PRIMIDONE 50 MG PO TABS
50.0000 mg | ORAL_TABLET | Freq: Every day | ORAL | Status: DC
  Filled 2018-07-30: qty 1

## 2018-07-30 MED ORDER — NITROGLYCERIN 0.4 MG SL SUBL
SUBLINGUAL_TABLET | SUBLINGUAL | Status: AC
  Administered 2018-07-30: 0.8 mg
  Filled 2018-07-30: qty 2

## 2018-07-30 MED ORDER — PERFLUTREN LIPID MICROSPHERE
INTRAVENOUS | Status: AC
  Filled 2018-07-30: qty 10

## 2018-07-30 MED ORDER — PERFLUTREN LIPID MICROSPHERE
1.0000 mL | INTRAVENOUS | Status: AC | PRN
  Administered 2018-07-30: 2 mL via INTRAVENOUS
  Filled 2018-07-30: qty 10

## 2018-07-30 MED ORDER — HEPARIN (PORCINE) IN NACL 100-0.45 UNIT/ML-% IJ SOLN
1200.0000 [IU]/h | INTRAMUSCULAR | Status: DC
  Administered 2018-07-30: 1200 [IU]/h via INTRAVENOUS
  Filled 2018-07-30: qty 250

## 2018-07-30 MED ORDER — OMEPRAZOLE 20 MG PO CPDR: 20.0000 mg | DELAYED_RELEASE_CAPSULE | Freq: Every day | ORAL | 0 refills | Status: DC

## 2018-07-30 MED ORDER — ACETAMINOPHEN 325 MG PO TABS: 650.0000 mg | ORAL_TABLET | ORAL | Status: DC | PRN

## 2018-07-30 MED ORDER — INFLUENZA VAC SPLIT HIGH-DOSE 0.5 ML IM SUSY: 0.5000 mL | PREFILLED_SYRINGE | INTRAMUSCULAR | Status: DC

## 2018-07-30 MED ORDER — METOPROLOL SUCCINATE ER 50 MG PO TB24
50.0000 mg | ORAL_TABLET | Freq: Every day | ORAL | Status: DC
  Administered 2018-07-30: 50 mg via ORAL
  Filled 2018-07-30: qty 1

## 2018-07-30 MED ORDER — TAMSULOSIN HCL 0.4 MG PO CAPS: 0.4000 mg | ORAL_CAPSULE | Freq: Every day | ORAL | Status: DC

## 2018-07-30 NOTE — Progress Notes (Signed)
[facility] PROGRESS NOTE  Bryan [name] ZCH:[id number] DOB: Feb 25, 1946 DOA: 07/29/2018  PCP: Shawnee Knapp, MD  Brief History/Interval Summary: 72 y.o. male with medical history significant of HTN, GERD, BPH, OSA, NHL in remission.  Patient presented to the ED with c/o Nausea and dizziness.  Worse with position change.  Patient also mentioned intermittent chest pain.  Evaluation in the emergency room revealed mildly elevated troponin.  Patient was hospitalized for further management.  Reason for Visit: Chest pain  Consultants: Cardiology  Procedures: None yet  Antibiotics: None  Subjective/Interval History: Patient denies any chest pain currently.  No dizziness either.  He denies any vertiginous symptoms.  He has had vertigo before.  His dizziness was more of a unsteady feeling.  Denies any shortness of breath.  ROS: No nausea or vomiting  Objective:  Vital Signs  Vitals:   07/30/18 0400 07/30/18 0406 07/30/18 0742 07/30/18 1000  BP:  (!) 148/93 136/79 (!) 163/83  Pulse:  61 62 61  Resp:  16 20   Temp:  98.4 F (36.9 C) 97.7 F (36.5 C)   TempSrc:  Oral Oral   SpO2:  100% 94%   Weight: 102 kg     Height:        Intake/Output Summary (Last 24 hours) at 07/30/2018 1101 Last data filed at 07/30/2018 1031 Gross per 24 hour  Intake 105.21 ml  Output 720 ml  Net -614.79 ml   Filed Weights   07/29/18 1810 07/30/18 0400  Weight: 103.9 kg 102 kg    General appearance: alert, cooperative, appears stated age and no distress Head: Normocephalic, without obvious abnormality, atraumatic Resp: clear to auscultation bilaterally Cardio: regular rate and rhythm, S1, S2 normal, no murmur, click, rub or gallop GI: soft, non-tender; bowel sounds normal; no masses,  no organomegaly Extremities: Edema noted bilateral lower extremity is about 1+. Pulses: 2+ and symmetric Neurologic: Awake alert.  Oriented x3.  Cranial nerves II to XII intact.  Motor strength equal bilateral  upper and lower extremities.  Lab Results:  Data Reviewed: I have personally reviewed following labs and imaging studies  CBC: Recent Labs  Lab 07/29/18 2019 07/30/18 0631  WBC 5.4 4.6  NEUTROABS 3.2  --   HGB 14.5 13.0  HCT 43.5 40.2  MCV 94.2 93.7  PLT 235 287    Basic Metabolic Panel: Recent Labs  Lab 07/29/18 2019  NA 138  K 4.3  CL 101  CO2 28  GLUCOSE 70  BUN 22  CREATININE 1.25*  CALCIUM 9.0    GFR: Estimated Creatinine Clearance: 57.8 mL/min (A) (by C-G formula based on SCr of 1.25 mg/dL (H)).  Liver Function Tests: Recent Labs  Lab 07/29/18 2019  AST 32  ALT 22  ALKPHOS 125  BILITOT 0.7  PROT 7.6  ALBUMIN 4.0    Cardiac Enzymes: Recent Labs  Lab 07/29/18 2019 07/29/18 2315 07/30/18 0631 07/30/18 0806  TROPONINI 0.06* 0.07* 0.05* 0.04*    CBG: Recent Labs  Lab 07/30/18 0132  GLUCAP 94     Radiology Studies: Dg Chest 2 View  Result Date: 07/29/2018 CLINICAL DATA:  Dizziness and shortness of breath. Marginal zone B-cell lymphoma. EXAM: CHEST - 2 VIEW COMPARISON:  01/08/2018 FINDINGS: The heart size and mediastinal contours are within normal limits. Both lungs are clear. The visualized skeletal structures are unremarkable. IMPRESSION: No active cardiopulmonary disease. Electronically Signed   By: Earle Gell M.D.   On: 07/29/2018 20:43   Mr Brain Wo Contrast  Result Date: 07/29/2018 CLINICAL DATA:  Ataxia and possible stroke EXAM: MRI HEAD WITHOUT CONTRAST MRA HEAD WITHOUT CONTRAST TECHNIQUE: Multiplanar, multiecho pulse sequences of the brain and surrounding structures were obtained without intravenous contrast. Angiographic images of the head were obtained using MRA technique without contrast. COMPARISON:  None. FINDINGS: MRI HEAD FINDINGS BRAIN: There is no acute infarct, acute hemorrhage or mass effect. The midline structures are normal. There are no old infarcts. Multifocal white matter hyperintensity, most commonly due to chronic  ischemic microangiopathy. The CSF spaces are normal for age, with no hydrocephalus. Susceptibility-sensitive sequences show no chronic microhemorrhage or superficial siderosis. SKULL AND UPPER CERVICAL SPINE: The visualized skull base, calvarium, upper cervical spine and extracranial soft tissues are normal. SINUSES/ORBITS: No fluid levels or advanced mucosal thickening. No mastoid or middle ear effusion. The orbits are normal. MRA HEAD FINDINGS The MRA is degraded by motion. Intracranial internal carotid arteries: Normal. Anterior cerebral arteries: Normal. Middle cerebral arteries: Normal. Posterior communicating arteries: Present bilaterally. Posterior cerebral arteries: Normal. Basilar artery: Normal. Vertebral arteries: Left dominant. Normal. Superior cerebellar arteries: Normal. Inferior cerebellar arteries: Normal. IMPRESSION: 1. No acute intracranial abnormality. 2. Mild multifocal white matter hyperintensity, most commonly indicating chronic small vessel ischemia. 3. Motion degraded MRA without occlusion or high-grade stenosis. Electronically Signed   By: Ulyses Jarred M.D.   On: 07/29/2018 21:38   Mr Jodene Nam Head (cerebral Arteries)  Result Date: 07/29/2018 CLINICAL DATA:  Ataxia and possible stroke EXAM: MRI HEAD WITHOUT CONTRAST MRA HEAD WITHOUT CONTRAST TECHNIQUE: Multiplanar, multiecho pulse sequences of the brain and surrounding structures were obtained without intravenous contrast. Angiographic images of the head were obtained using MRA technique without contrast. COMPARISON:  None. FINDINGS: MRI HEAD FINDINGS BRAIN: There is no acute infarct, acute hemorrhage or mass effect. The midline structures are normal. There are no old infarcts. Multifocal white matter hyperintensity, most commonly due to chronic ischemic microangiopathy. The CSF spaces are normal for age, with no hydrocephalus. Susceptibility-sensitive sequences show no chronic microhemorrhage or superficial siderosis. SKULL AND UPPER  CERVICAL SPINE: The visualized skull base, calvarium, upper cervical spine and extracranial soft tissues are normal. SINUSES/ORBITS: No fluid levels or advanced mucosal thickening. No mastoid or middle ear effusion. The orbits are normal. MRA HEAD FINDINGS The MRA is degraded by motion. Intracranial internal carotid arteries: Normal. Anterior cerebral arteries: Normal. Middle cerebral arteries: Normal. Posterior communicating arteries: Present bilaterally. Posterior cerebral arteries: Normal. Basilar artery: Normal. Vertebral arteries: Left dominant. Normal. Superior cerebellar arteries: Normal. Inferior cerebellar arteries: Normal. IMPRESSION: 1. No acute intracranial abnormality. 2. Mild multifocal white matter hyperintensity, most commonly indicating chronic small vessel ischemia. 3. Motion degraded MRA without occlusion or high-grade stenosis. Electronically Signed   By: Ulyses Jarred M.D.   On: 07/29/2018 21:38     Medications:  Scheduled: . gabapentin  300 mg Oral TID  . [START ON 07/31/2018] Influenza vac split quadrivalent PF  0.5 mL Intramuscular Tomorrow-1000  . metoprolol succinate  50 mg Oral Daily  . primidone  100 mg Oral Daily   And  . primidone  50 mg Oral QHS  . tamsulosin  0.4 mg Oral QHS   Continuous: . heparin 1,200 Units/hr (07/30/18 0205)   IWP:[id number], ondansetron (ZOFRAN) IV, traMADol  Assessment/Plan:    Chest pain in the setting of mildly elevated troponin His symptoms were atypical.  EKG does not show any obvious ischemic changes.  Continue aspirin.  Cardiology consulted.  Will likely need some form of ischemic work-up.  Continue beta-blocker.  Essential  hypertension Monitor blood pressures closely.  Continue beta-blocker.  History of lymphoma Currently in remission.  History of obstructive sleep apnea CPAP  Lower extremity edema Check TSH.  No recent echocardiogram in our system.  We will order one.    DVT Prophylaxis: On IV heparin    Code  Status: Full code Family Communication: Discussed with the patient Disposition Plan: Await cardiology input.  Echocardiogram has been ordered.    LOS: 0 days   Bonnielee Haff  [facility] Pager [phone] 07/30/2018, 11:01 AM  If 7PM-7AM, please contact night-coverage at [email or web address], password [facility]

## 2018-07-30 NOTE — ED Notes (Signed)
ED Provider at bedside. 

## 2018-07-30 NOTE — Care Management Obs Status (Signed)
[place] NOTIFICATION   Patient Details  Name: NATHANEAL SOMMERS MRN: [id number] Date of Birth: 1946-08-04   Medicare Observation Status Notification Given:  Yes    Carles Collet, RN 07/30/2018, 3:44 PM

## 2018-07-30 NOTE — Progress Notes (Signed)
   Coronary CTA shows calcium score of 21 which is at 38th percentile for age and sex.   His pain is not of cardiac origin. He should follow risk factor modification with his PCP and we will sign off.   Daune Perch, AGNP-C [facility] 07/30/2018  4:44 PM Pager: [phone]

## 2018-07-30 NOTE — ED Notes (Signed)
[facility] has been notified for transport

## 2018-07-30 NOTE — H&P (Signed)
History and Physical    Bryan [name] PJA:[id number] DOB: 1946-02-06 DOA: 07/29/2018  PCP: Shawnee Knapp, MD  Patient coming from: Home  I have personally briefly reviewed patient's old medical records in [place]  Chief Complaint: Dizziness, CP  HPI: Bryan [name] is a 72 y.o. male with medical history significant of HTN, GERD, BPH, OSA, NHL in remission.  Patient presents to the ED with c/o Nausea and dizziness when he woke up this morning.  Worse with position change.  Over the past 3 nights has had increase in intermittent chest pain that started earlier this month, he thought this was just reflux.  Denies fever, chills, cough.   ED Course: MRI brain neg.  Trop 0.06, then 0.07 on repeat in ED.    Review of Systems: As per HPI otherwise 10 point review of systems negative.   Past Medical History:  Diagnosis Date  . Anemia   . Arthritis    hands, knees, cervical area. Back pain. Rheumatoid arthritis- weekly injections.  [name] BPH (benign prostatic hypertrophy)   . Cancer ([place])   . Essential tremor   . GERD (gastroesophageal reflux disease)    reports for indigestion he uses mustard   . Hearing deficit    wears hearing aids bilateral  . HTN (hypertension)   . Obesity   . Refusal of blood transfusions as patient is Jehovah's Witness   . Right bundle branch block    history of  . Seizures (HCC)    AS A CHILD. only esential tremors now.  . Sleep apnea    cpap - settings at 9 per patient   . Ulcer     Past Surgical History:  Procedure Laterality Date  . ANTERIOR CERVICAL DECOMP/DISCECTOMY FUSION N/A 03/30/2013   Procedure: ANTERIOR CERVICAL DECOMPRESSION/DISCECTOMY FUSION 2 LEVELS;  Surgeon: Otilio Connors, MD;  Location: [facility];  Service: Neurosurgery;  Laterality: N/A;  C4-5 C5-6 Anterior cervical decompression/diskectomy/fusion/Allograft/Plate  . CHOLECYSTECTOMY N/A 12/07/2013   Procedure: LAPAROSCOPIC CHOLECYSTECTOMY WITH INTRAOPERATIVE CHOLANGIOGRAM;   Surgeon: Adin Hector, MD;  Location: [facility];  Service: General;  Laterality: N/A;  . COLONOSCOPY    . DECOMPRESSIVE LUMBAR LAMINECTOMY LEVEL 2 N/A 02/15/2015   Procedure: COMPLETE DECOMPRESSIVE LUMBAR LAMINECTOMY L4-L5/ FORAMINOTOMY TO L4 NERVE ROOT AND L5 NERVE ROOT BILATERALLY;  Surgeon: [name], MD;  Location: [facility];  Service: Orthopedics;  Laterality: N/A;  . EYE SURGERY     right, growth excision  . LUMBAR LAMINECTOMY/DECOMPRESSION MICRODISCECTOMY Left 01/18/2016   Procedure:  DECOMPRESSION L4-L5 MICRODISCECTOMY L4-L5 ON LEFT FOR SPINAL STENOSIS;  Surgeon: [name], MD;  Location: [facility];  Service: Orthopedics;  Laterality: Left;  . PAROTIDECTOMY Left 01/24/2018   Procedure: INCISIONAL BIOPSY OF LEFT PAROTID    MASS;  Surgeon: Helayne Seminole, MD;  Location: [facility];  Service: ENT;  Laterality: Left;  . SPINE SURGERY    . TONSILLECTOMY    . VASECTOMY    . WRIST GANGLION EXCISION Left      reports that he quit smoking about 45 years ago. His smoking use included cigarettes. He has never used smokeless tobacco. He reports that he drinks alcohol. He reports that he does not use drugs.  Allergies  Allergen Reactions  . Lisinopril Cough and Other (See Comments)    Cough  . Other Other (See Comments)    BLOOD PRODUCT REFUSAL (patient is a Jehovah's Witness) BLOOD PRODUCT REFUSAL (patient is a Sales promotion account executive Witness)    Family History  Problem Relation Age of Onset  . Colon cancer Maternal Grandfather   . Kidney disease Brother   . Stroke Maternal Grandmother   . Diabetes Paternal Aunt        x 4 aunts     Prior to Admission medications   Medication Sig Start Date End Date Taking? Authorizing Provider  diclofenac (VOLTAREN) 75 MG EC tablet Take 75 mg by mouth 3 (three) times daily. 10/04/17  Yes [provider]  gabapentin (NEURONTIN) 300 MG capsule TAKE 1 CAPSULE BY MOUTH THREE TIMES DAILY 06/17/18  Yes Stallings, Zoe A, MD  hydrochlorothiazide (HYDRODIURIL)  25 MG tablet TAKE 25 MG BY MOUTH ONCE DAILY 02/24/18  Yes Stallings, Zoe A, MD  meloxicam (MOBIC) 15 MG tablet Take 15 mg by mouth daily. 07/11/18  Yes [provider]  metoprolol succinate (TOPROL-XL) 50 MG 24 hr tablet TAKE 1 TABLET BY MOUTH ONCE DAILY. TAKE WITH OR IMMEDIATELY FOLLOWING A MEAL Patient taking differently: Take 50 mg by mouth daily.  06/26/17  Yes [name], MD  primidone (MYSOLINE) 50 MG tablet Take 50-100 mg by mouth. 100 mg in the and 50 mg in the evening   Yes [provider]  Tamsulosin HCl (FLOMAX) 0.4 MG CAPS Take 0.4 mg by mouth at bedtime.    Yes [provider]  traMADol (ULTRAM) 50 MG tablet Take 1 tablet (50 mg total) every 8 (eight) hours as needed by mouth for severe pain. 09/27/17  Yes Harrison Mons, PA    Physical Exam: Vitals:   07/30/18 0115 07/30/18 0130 07/30/18 0145 07/30/18 0200  BP: (!) 165/78 (!) 152/80 (!) 167/85 (!) 156/87  Pulse: 60 (!) 57 (!) 58 (!) 59  Resp: 16 17 (!) 21 17  Temp:      TempSrc:      SpO2: 97% 100% 100% 100%  Weight:      Height:        Constitutional: NAD, calm, comfortable Eyes: PERRL, lids and conjunctivae normal ENMT: Mucous membranes are moist. Posterior pharynx clear of any exudate or lesions.Normal dentition.  Neck: normal, supple, no masses, no thyromegaly Respiratory: clear to auscultation bilaterally, no wheezing, no crackles. Normal respiratory effort. No accessory muscle use.  Cardiovascular: Regular rate and rhythm, no murmurs / rubs / gallops. No extremity edema. 2+ pedal pulses. No carotid bruits.  Abdomen: no tenderness, no masses palpated. No hepatosplenomegaly. Bowel sounds positive.  Musculoskeletal: no clubbing / cyanosis. No joint deformity upper and lower extremities. Good ROM, no contractures. Normal muscle tone.  Skin: no rashes, lesions, ulcers. No induration Neurologic: CN 2-12 grossly intact. Sensation intact, DTR normal. Strength 5/5 in all 4.  Psychiatric:  Normal judgment and insight. Alert and oriented x 3. Normal mood.    Labs on Admission: I have personally reviewed following labs and imaging studies  CBC: Recent Labs  Lab 07/29/18 2019  WBC 5.4  NEUTROABS 3.2  HGB 14.5  HCT 43.5  MCV 94.2  PLT 852   Basic Metabolic Panel: Recent Labs  Lab 07/29/18 2019  NA 138  K 4.3  CL 101  CO2 28  GLUCOSE 70  BUN 22  CREATININE 1.25*  CALCIUM 9.0   GFR: Estimated Creatinine Clearance: 58.4 mL/min (A) (by C-G formula based on SCr of 1.25 mg/dL (H)). Liver Function Tests: Recent Labs  Lab 07/29/18 2019  AST 32  ALT 22  ALKPHOS 125  BILITOT 0.7  PROT 7.6  ALBUMIN 4.0   No results for input(s): LIPASE, AMYLASE in  the last 168 hours. No results for input(s): AMMONIA in the last 168 hours. Coagulation Profile: No results for input(s): INR, PROTIME in the last 168 hours. Cardiac Enzymes: Recent Labs  Lab 07/29/18 2019 07/29/18 2315  TROPONINI 0.06* 0.07*   BNP (last 3 results) No results for input(s): PROBNP in the last 8760 hours. HbA1C: No results for input(s): HGBA1C in the last 72 hours. CBG: Recent Labs  Lab 07/30/18 0132  GLUCAP 94   Lipid Profile: No results for input(s): CHOL, HDL, LDLCALC, TRIG, CHOLHDL, LDLDIRECT in the last 72 hours. Thyroid Function Tests: No results for input(s): TSH, T4TOTAL, FREET4, T3FREE, THYROIDAB in the last 72 hours. Anemia Panel: No results for input(s): VITAMINB12, FOLATE, FERRITIN, TIBC, IRON, RETICCTPCT in the last 72 hours. Urine analysis:    Component Value Date/Time   COLORURINE STRAW (A) 07/29/2018 2205   APPEARANCEUR CLEAR 07/29/2018 2205   LABSPEC 1.006 07/29/2018 2205   PHURINE 6.0 07/29/2018 2205   GLUCOSEU NEGATIVE 07/29/2018 2205   HGBUR NEGATIVE 07/29/2018 2205   BILIRUBINUR NEGATIVE 07/29/2018 2205   BILIRUBINUR negative 05/09/2018 1809   BILIRUBINUR neg 07/29/2013 0953   KETONESUR NEGATIVE 07/29/2018 2205   PROTEINUR NEGATIVE 07/29/2018 2205    UROBILINOGEN 0.2 05/09/2018 1809   UROBILINOGEN 0.2 02/08/2015 0815   NITRITE NEGATIVE 07/29/2018 2205   LEUKOCYTESUR NEGATIVE 07/29/2018 2205    Radiological Exams on Admission: Dg Chest 2 View  Result Date: 07/29/2018 CLINICAL DATA:  Dizziness and shortness of breath. Marginal zone B-cell lymphoma. EXAM: CHEST - 2 VIEW COMPARISON:  01/08/2018 FINDINGS: The heart size and mediastinal contours are within normal limits. Both lungs are clear. The visualized skeletal structures are unremarkable. IMPRESSION: No active cardiopulmonary disease. Electronically Signed   By: Earle Gell M.D.   On: 07/29/2018 20:43   Mr Brain Wo Contrast  Result Date: 07/29/2018 CLINICAL DATA:  Ataxia and possible stroke EXAM: MRI HEAD WITHOUT CONTRAST MRA HEAD WITHOUT CONTRAST TECHNIQUE: Multiplanar, multiecho pulse sequences of the brain and surrounding structures were obtained without intravenous contrast. Angiographic images of the head were obtained using MRA technique without contrast. COMPARISON:  None. FINDINGS: MRI HEAD FINDINGS BRAIN: There is no acute infarct, acute hemorrhage or mass effect. The midline structures are normal. There are no old infarcts. Multifocal white matter hyperintensity, most commonly due to chronic ischemic microangiopathy. The CSF spaces are normal for age, with no hydrocephalus. Susceptibility-sensitive sequences show no chronic microhemorrhage or superficial siderosis. SKULL AND UPPER CERVICAL SPINE: The visualized skull base, calvarium, upper cervical spine and extracranial soft tissues are normal. SINUSES/ORBITS: No fluid levels or advanced mucosal thickening. No mastoid or middle ear effusion. The orbits are normal. MRA HEAD FINDINGS The MRA is degraded by motion. Intracranial internal carotid arteries: Normal. Anterior cerebral arteries: Normal. Middle cerebral arteries: Normal. Posterior communicating arteries: Present bilaterally. Posterior cerebral arteries: Normal. Basilar artery:  Normal. Vertebral arteries: Left dominant. Normal. Superior cerebellar arteries: Normal. Inferior cerebellar arteries: Normal. IMPRESSION: 1. No acute intracranial abnormality. 2. Mild multifocal white matter hyperintensity, most commonly indicating chronic small vessel ischemia. 3. Motion degraded MRA without occlusion or high-grade stenosis. Electronically Signed   By: Ulyses Jarred M.D.   On: 07/29/2018 21:38   Mr Jodene Nam Head (cerebral Arteries)  Result Date: 07/29/2018 CLINICAL DATA:  Ataxia and possible stroke EXAM: MRI HEAD WITHOUT CONTRAST MRA HEAD WITHOUT CONTRAST TECHNIQUE: Multiplanar, multiecho pulse sequences of the brain and surrounding structures were obtained without intravenous contrast. Angiographic images of the head were obtained using MRA technique without contrast.  COMPARISON:  None. FINDINGS: MRI HEAD FINDINGS BRAIN: There is no acute infarct, acute hemorrhage or mass effect. The midline structures are normal. There are no old infarcts. Multifocal white matter hyperintensity, most commonly due to chronic ischemic microangiopathy. The CSF spaces are normal for age, with no hydrocephalus. Susceptibility-sensitive sequences show no chronic microhemorrhage or superficial siderosis. SKULL AND UPPER CERVICAL SPINE: The visualized skull base, calvarium, upper cervical spine and extracranial soft tissues are normal. SINUSES/ORBITS: No fluid levels or advanced mucosal thickening. No mastoid or middle ear effusion. The orbits are normal. MRA HEAD FINDINGS The MRA is degraded by motion. Intracranial internal carotid arteries: Normal. Anterior cerebral arteries: Normal. Middle cerebral arteries: Normal. Posterior communicating arteries: Present bilaterally. Posterior cerebral arteries: Normal. Basilar artery: Normal. Vertebral arteries: Left dominant. Normal. Superior cerebellar arteries: Normal. Inferior cerebellar arteries: Normal. IMPRESSION: 1. No acute intracranial abnormality. 2. Mild multifocal  white matter hyperintensity, most commonly indicating chronic small vessel ischemia. 3. Motion degraded MRA without occlusion or high-grade stenosis. Electronically Signed   By: Ulyses Jarred M.D.   On: 07/29/2018 21:38    EKG: Independently reviewed.  Assessment/Plan Principal Problem:   Chest pain, rule out acute myocardial infarction Active Problems:   Hypertension   Marginal zone lymphoma (HCC)    1. Chest pain, concern for UA given trops - 1. EDP spoke with Cards on call 2. They said to have [place] admit to [facility] and they would consult. 3. They also recd starting heparin gtt which has been ordered. 4. CP obs pathway 5. Had 324 ASA 6. Tele monitor 7. Serial trops 8. NPO 2. HTN - 1. Continue metoprolol 2. Hold HCTZ while NPO 3. Lymphoma - currently believed to be in remission  DVT prophylaxis: Heparin gtt Code Status: Full Family Communication: No family in room Disposition Plan: Home after admit Consults called: Cards Admission status: Place in obs   Andromeda Poppen, [facility] [facility] Pager [phone] Only works nights!  If 7AM-7PM, please contact the primary day team physician taking care of patient  [email or web address] Password [email or web address]  07/30/2018, 2:38 AM

## 2018-07-30 NOTE — Progress Notes (Signed)
Patient arrived via care link to unit [place] bed 18. Assisted patient to bed by nursing staff.No acute distress noted at present time.Oriented patient to nursing unit and call bell system.Will continue to monitor.

## 2018-07-30 NOTE — Progress Notes (Signed)
ANTICOAGULATION CONSULT NOTE - Initial Consult  Pharmacy Consult for Hepain Indication: chest pain/ACS  Allergies  Allergen Reactions  . Lisinopril Cough and Other (See Comments)    Cough  . Other Other (See Comments)    BLOOD PRODUCT REFUSAL (patient is a Jehovah's Witness) BLOOD PRODUCT REFUSAL (patient is a Jehovah's Witness)    Patient Measurements: Height: 5\' 5"  (165.1 cm) Weight: 229 lb (103.9 kg) IBW/kg (Calculated) : 61.5 Heparin Dosing Weight:   Vital Signs: Temp: 98.4 F (36.9 C) (09/17 1810) Temp Source: Oral (09/17 1810) BP: 156/87 (09/18 0200) Pulse Rate: 59 (09/18 0200)  Labs: Recent Labs    07/29/18 2019 07/29/18 2315  HGB 14.5  --   HCT 43.5  --   PLT 235  --   CREATININE 1.25*  --   TROPONINI 0.06* 0.07*    Estimated Creatinine Clearance: 58.4 mL/min (A) (by C-G formula based on SCr of 1.25 mg/dL (H)).   Medical History: Past Medical History:  Diagnosis Date  . Anemia   . Arthritis    hands, knees, cervical area. Back pain. Rheumatoid arthritis- weekly injections.  [name] BPH (benign prostatic hypertrophy)   . Cancer ([place])   . Essential tremor   . GERD (gastroesophageal reflux disease)    reports for indigestion he uses mustard   . Hearing deficit    wears hearing aids bilateral  . HTN (hypertension)   . Obesity   . Refusal of blood transfusions as patient is Jehovah's Witness   . Right bundle branch block    history of  . Seizures (HCC)    AS A CHILD. only esential tremors now.  . Sleep apnea    cpap - settings at 9 per patient   . Ulcer     Medications:  Infusions:  . heparin 1,200 Units/hr (07/30/18 0205)    Assessment: Patient with ACS and unstable angina.  No oral anticoagulants noted on med rec.   Goal of Therapy:  Heparin level 0.3-0.7 units/ml Monitor platelets by anticoagulation protocol: Yes   Plan:  Heparin bolus 4000  units iv x1 Heparin drip at 1200 units/hr Daily CBC Next heparin level at [place], [place] 07/30/2018,2:07 AM

## 2018-07-30 NOTE — Progress Notes (Signed)
  Echocardiogram 2D Echocardiogram has been performed with [facility].  Bryan [name] 07/30/2018, 2:47 PM

## 2018-07-30 NOTE — Discharge Summary (Signed)
[facility]  Physician Discharge Summary   Patient ID: Bryan [name] MRN: [id number] DOB/AGE: Aug 24, 1946 72 y.o.  Admit date: 07/29/2018 Discharge date: 07/30/2018  PCP: Shawnee Knapp, MD  DISCHARGE DIAGNOSES:  Chest pain, noncardiac Possible GERD Essential hypertension Obstructive sleep apnea  RECOMMENDATIONS FOR OUTPATIENT FOLLOW UP: 1. Patient to be started on PPI 2. Close outpatient follow-up with primary care provider 3. TSH and lipid panel to be checked in the outpatient setting.   DISCHARGE CONDITION: fair  Diet recommendation: As before  Filed Weights   07/29/18 1810 07/30/18 0400  Weight: 103.9 kg 102 kg    INITIAL HISTORY: 71 y.o.malewith medical history significant ofHTN, GERD, BPH, OSA, NHL in remission.  Patient presented to the ED with c/o Nausea and dizziness. Worse with position change.  Patient also mentioned intermittent chest pain.  Evaluation in the emergency room revealed mildly elevated troponin.  Patient was hospitalized for further management.  Consultations:  Cardiology  Procedures:  Transthoracic echocardiogram Study Conclusions  - Left ventricle: The cavity size was normal. Wall thickness was   normal. Systolic function was normal. The estimated ejection   fraction was in the range of 55% to 60%. Wall motion was normal;   there were no regional wall motion abnormalities. Doppler   parameters are consistent with abnormal left ventricular   relaxation (grade 1 diastolic dysfunction). - Aortic valve: There was mild regurgitation. - Pulmonary arteries: Systolic pressure was mildly increased. PA   peak pressure: 38 mm Hg (S).  Coronary CT IMPRESSION: 1. Coronary calcium score of 21. This was 72 percentile for age and sex matched control. 2. Normal coronary origin with right dominance. 3. There is mild to moderate CAD in the proximal and mid LAD. Aggressive risk factor modification is recommended.   HOSPITAL COURSE:    Chest pain in the setting of mildly elevated troponin His symptoms were atypical.  EKG does not show any obvious ischemic changes.    Patient was seen by cardiology.  Patient underwent coronary CT which did not show any significant disease.  Also underwent echocardiogram which showed normal systolic function with grade 1 diastolic dysfunction.  Results communicated to the patient.  He was reassured.  His chest pain could have been due to GERD.  Reason for mildly elevated troponin is not entirely clear.  He does not need any further work-up at this time.  He will continue with his home medications.  Continue with aspirin. He needs aggressive risk reduction.  Will recommend that he have his cholesterol level checked in the outpatient setting if not done recently.    Essential hypertension Stable.  Continue home medication  History of lymphoma Currently in remission.  History of obstructive sleep apnea CPAP  Lower extremity edema Echocardiogram showed normal systolic function with grade 1 diastolic dysfunction.  Continue with HCTZ.  Recommend TSH to be checked in the outpatient setting.  Dizziness Patient did have complaints of lightheadedness.  Denies any vertiginous symptoms.  MRI did not show any stroke.  Echocardiogram as above.  Does not show any significant valvular abnormalities.  Patient was ambulated without any difficulties.  Overall stable.  Okay for discharge home today.   PERTINENT LABS:  The results of significant diagnostics from this hospitalization (including imaging, microbiology, ancillary and laboratory) are listed below for reference.      Labs: Basic Metabolic Panel: Recent Labs  Lab 07/29/18 2019  NA 138  K 4.3  CL 101  CO2 28  GLUCOSE 70  BUN 22  CREATININE 1.25*  CALCIUM 9.0   Liver Function Tests: Recent Labs  Lab 07/29/18 2019  AST 32  ALT 22  ALKPHOS 125  BILITOT 0.7  PROT 7.6  ALBUMIN 4.0   CBC: Recent Labs  Lab 07/29/18 2019  07/30/18 0631  WBC 5.4 4.6  NEUTROABS 3.2  --   HGB 14.5 13.0  HCT 43.5 40.2  MCV 94.2 93.7  PLT 235 205   Cardiac Enzymes: Recent Labs  Lab 07/29/18 2019 07/29/18 2315 07/30/18 0631 07/30/18 0806  TROPONINI 0.06* 0.07* 0.05* 0.04*    CBG: Recent Labs  Lab 07/30/18 0132  GLUCAP 94     IMAGING STUDIES Dg Chest 2 View  Result Date: 07/29/2018 CLINICAL DATA:  Dizziness and shortness of breath. Marginal zone B-cell lymphoma. EXAM: CHEST - 2 VIEW COMPARISON:  01/08/2018 FINDINGS: The heart size and mediastinal contours are within normal limits. Both lungs are clear. The visualized skeletal structures are unremarkable. IMPRESSION: No active cardiopulmonary disease. Electronically Signed   By: Earle Gell M.D.   On: 07/29/2018 20:43   Mr Brain Wo Contrast  Result Date: 07/29/2018 CLINICAL DATA:  Ataxia and possible stroke EXAM: MRI HEAD WITHOUT CONTRAST MRA HEAD WITHOUT CONTRAST TECHNIQUE: Multiplanar, multiecho pulse sequences of the brain and surrounding structures were obtained without intravenous contrast. Angiographic images of the head were obtained using MRA technique without contrast. COMPARISON:  None. FINDINGS: MRI HEAD FINDINGS BRAIN: There is no acute infarct, acute hemorrhage or mass effect. The midline structures are normal. There are no old infarcts. Multifocal white matter hyperintensity, most commonly due to chronic ischemic microangiopathy. The CSF spaces are normal for age, with no hydrocephalus. Susceptibility-sensitive sequences show no chronic microhemorrhage or superficial siderosis. SKULL AND UPPER CERVICAL SPINE: The visualized skull base, calvarium, upper cervical spine and extracranial soft tissues are normal. SINUSES/ORBITS: No fluid levels or advanced mucosal thickening. No mastoid or middle ear effusion. The orbits are normal. MRA HEAD FINDINGS The MRA is degraded by motion. Intracranial internal carotid arteries: Normal. Anterior cerebral arteries: Normal.  Middle cerebral arteries: Normal. Posterior communicating arteries: Present bilaterally. Posterior cerebral arteries: Normal. Basilar artery: Normal. Vertebral arteries: Left dominant. Normal. Superior cerebellar arteries: Normal. Inferior cerebellar arteries: Normal. IMPRESSION: 1. No acute intracranial abnormality. 2. Mild multifocal white matter hyperintensity, most commonly indicating chronic small vessel ischemia. 3. Motion degraded MRA without occlusion or high-grade stenosis. Electronically Signed   By: Ulyses Jarred M.D.   On: 07/29/2018 21:38   Ct Coronary Morph W/cta Cor W/score W/ca W/cm &/or Wo/cm  Addendum Date: 07/30/2018   ADDENDUM REPORT: 07/30/2018 14:53 CLINICAL DATA:  72 year old male with dizziness and chest pain. EXAM: Cardiac/Coronary  CT TECHNIQUE: The patient was scanned on a [employer]. FINDINGS: A 120 kV prospective scan was triggered in the descending thoracic aorta at 111 HU's. Axial non-contrast 3 mm slices were carried out through the heart. The data set was analyzed on a dedicated work station and scored using the [employer]. Gantry rotation speed was 250 msecs and collimation was .6 mm. No beta blockade and 0.8 mg of sl NTG was given. The 3D data set was reconstructed in 5% intervals of the 67-82 % of the R-R cycle. Diastolic phases were analyzed on a dedicated work station using MPR, MIP and VRT modes. The patient received 80 cc of contrast. Aorta:  Normal size.  No calcifications.  No dissection. Aortic Valve:  Trileaflet.  No calcifications. Coronary Arteries:  Normal coronary origin.  Right dominance. RCA is a large  dominant artery that gives rise to PDA and PLVB. There is minimal on-calcified plaque with stenosis 0-25%. Left main is a large artery that gives rise to LAD and LCX arteries. Left main has minimal stenosis. LAD is a large vessel that has mild mixed plaque in the proximal and mid portio with stenosis 25-50% and a focal 50-69% stenosis in the mid  LAD. LCX is a non-dominant artery that gives rise to one large OM1 branch. There is minimal plaque with associated stenosis 0-25%. Other findings: Normal pulmonary vein drainage into the left atrium. Normal let atrial appendage without a thrombus. Normal size of the pulmonary artery. IMPRESSION: 1. Coronary calcium score of 21. This was 87 percentile for age and sex matched control. 2. Normal coronary origin with right dominance. 3. There is mild to moderate CAD in the proximal and mid LAD. Aggressive risk factor modification is recommended. Electronically Signed   By: Ena Dawley   On: 07/30/2018 14:53   Result Date: 07/30/2018 EXAM: OVER-READ INTERPRETATION  CT CHEST The following report is an over-read performed by radiologist Dr. Rolm Baptise of [facility], [place] on 07/30/2018. This over-read does not include interpretation of cardiac or coronary anatomy or pathology. The coronary CTA interpretation by the cardiologist is attached. COMPARISON:  PET CT 04/22/2018 FINDINGS: Vascular: Heart is normal size.  Visualized aorta is normal caliber. Mediastinum/Nodes: No adenopathy in the lower mediastinum or hila. Lungs/Pleura: Linear atelectasis or scarring in the medial posterior right lung base. No effusions. Upper Abdomen: Imaging into the upper abdomen shows no acute findings. Musculoskeletal: Chest wall soft tissues are unremarkable. No acute bony abnormality. IMPRESSION: No acute or significant extracardiac abnormality. Electronically Signed: By: Rolm Baptise M.D. On: 07/30/2018 12:40   Mr Jodene Nam Head (cerebral Arteries)  Result Date: 07/29/2018 CLINICAL DATA:  Ataxia and possible stroke EXAM: MRI HEAD WITHOUT CONTRAST MRA HEAD WITHOUT CONTRAST TECHNIQUE: Multiplanar, multiecho pulse sequences of the brain and surrounding structures were obtained without intravenous contrast. Angiographic images of the head were obtained using MRA technique without contrast. COMPARISON:  None. FINDINGS: MRI HEAD  FINDINGS BRAIN: There is no acute infarct, acute hemorrhage or mass effect. The midline structures are normal. There are no old infarcts. Multifocal white matter hyperintensity, most commonly due to chronic ischemic microangiopathy. The CSF spaces are normal for age, with no hydrocephalus. Susceptibility-sensitive sequences show no chronic microhemorrhage or superficial siderosis. SKULL AND UPPER CERVICAL SPINE: The visualized skull base, calvarium, upper cervical spine and extracranial soft tissues are normal. SINUSES/ORBITS: No fluid levels or advanced mucosal thickening. No mastoid or middle ear effusion. The orbits are normal. MRA HEAD FINDINGS The MRA is degraded by motion. Intracranial internal carotid arteries: Normal. Anterior cerebral arteries: Normal. Middle cerebral arteries: Normal. Posterior communicating arteries: Present bilaterally. Posterior cerebral arteries: Normal. Basilar artery: Normal. Vertebral arteries: Left dominant. Normal. Superior cerebellar arteries: Normal. Inferior cerebellar arteries: Normal. IMPRESSION: 1. No acute intracranial abnormality. 2. Mild multifocal white matter hyperintensity, most commonly indicating chronic small vessel ischemia. 3. Motion degraded MRA without occlusion or high-grade stenosis. Electronically Signed   By: Ulyses Jarred M.D.   On: 07/29/2018 21:38    DISCHARGE EXAMINATION: See progress note from earlier today  DISPOSITION: Home  Discharge Instructions    Call MD for:  difficulty breathing, headache or visual disturbances   Complete by:  As directed    Call MD for:  extreme fatigue   Complete by:  As directed    Call MD for:  hives   Complete by:  As directed    Call MD for:  persistant dizziness or light-headedness   Complete by:  As directed    Call MD for:  persistant nausea and vomiting   Complete by:  As directed    Call MD for:  severe uncontrolled pain   Complete by:  As directed    Call MD for:  temperature >100.4   Complete  by:  As directed    Diet - low sodium heart healthy   Complete by:  As directed    Discharge instructions   Complete by:  As directed    Please be sure to follow up with your PCP within 1 week. Your pain could have been due to acid reflux. Take omeprazole as prescribed. Have your PCP check your cholesterol levels if not done recently.  You were cared for by a hospitalist during your hospital stay. If you have any questions about your discharge medications or the care you received while you were in the hospital after you are discharged, you can call the unit and asked to speak with the hospitalist on call if the hospitalist that took care of you is not available. Once you are discharged, your primary care physician will handle any further medical issues. Please note that NO REFILLS for any discharge medications will be authorized once you are discharged, as it is imperative that you return to your primary care physician (or establish a relationship with a primary care physician if you do not have one) for your aftercare needs so that they can reassess your need for medications and monitor your lab values. If you do not have a primary care physician, you can call [phone] for a physician referral.   Increase activity slowly   Complete by:  As directed         Allergies as of 07/30/2018      Reactions   Lisinopril Cough, Other (See Comments)   Cough   Other Other (See Comments)   BLOOD PRODUCT REFUSAL (patient is a Jehovah's Witness) BLOOD PRODUCT REFUSAL (patient is a Sales promotion account executive Witness)      Medication List    STOP taking these medications   diclofenac 75 MG EC tablet Commonly known as:  VOLTAREN     TAKE these medications   aspirin EC 81 MG tablet Take 1 tablet (81 mg total) by mouth daily.   gabapentin 300 MG capsule Commonly known as:  NEURONTIN TAKE 1 CAPSULE BY MOUTH THREE TIMES DAILY   hydrochlorothiazide 25 MG tablet Commonly known as:  HYDRODIURIL TAKE 25 MG BY MOUTH  ONCE DAILY   meloxicam 15 MG tablet Commonly known as:  MOBIC Take 15 mg by mouth daily.   metoprolol succinate 50 MG 24 hr tablet Commonly known as:  TOPROL-XL TAKE 1 TABLET BY MOUTH ONCE DAILY. TAKE WITH OR IMMEDIATELY FOLLOWING A MEAL What changed:  See the new instructions.   omeprazole 20 MG capsule Commonly known as:  PRILOSEC Take 1 capsule (20 mg total) by mouth daily.   primidone 50 MG tablet Commonly known as:  MYSOLINE Take 50-100 mg by mouth. 100 mg in the and 50 mg in the evening   tamsulosin 0.4 MG Caps capsule Commonly known as:  FLOMAX Take 0.4 mg by mouth at bedtime.   traMADol 50 MG tablet Commonly known as:  ULTRAM Take 1 tablet (50 mg total) every 8 (eight) hours as needed by mouth for severe pain.        Follow-up Information  Shawnee Knapp, MD. Schedule an appointment as soon as possible for a visit in 1 week(s).   Specialty:  Family Medicine Contact information: [place] [address] [phone]           TOTAL DISCHARGE TIME: 35 minutes  Bonnielee Haff  [facility] Pager [phone]  07/30/2018, 5:43 PM

## 2018-07-30 NOTE — ED Notes (Signed)
Report given to [facility], pt transported to receiving facility.

## 2018-07-30 NOTE — Progress Notes (Signed)
Patient received discharge information and acknowledged understanding of it. Patient received printed prescriptions. Patient IVs were removed.

## 2018-07-30 NOTE — Consult Note (Addendum)
Cardiology Consultation:   Patient ID: Bryan [name] MRN: [id number]; DOB: 03-24-1946  Admit date: 07/29/2018 Date of Consult: 07/30/2018  Primary Care Provider: Shawnee Knapp, MD Primary Cardiologist: No primary care provider on file. New for Dr. Sallyanne Kuster  Patient Profile:   Bryan [name] is a 72 y.o. male with a hx of anemia, arthritis, BPH, lymphoma s/p chemo, essential tremor, GERD, hearing deficit, hypertension, obesity, Jehovah's Witness, right bundle branch block, sleep apnea on CPAP  who is being seen today for the evaluation of chest pain and dizziness at the request of Dr. Maryland Pink.  History of Present Illness:   Bryan [name] denies any prior history of cardiac events.  He has hypertension that he reports is well controlled but no diabetes.  He smoked a long time ago but has quit for almost 50 years.  He only has rare alcohol use.  His father died of a heart attack at age 33 and had hypertension.  No other known cardiac family history.  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  He does some light housework and light yard work.  His activity is limited by back pain.  He denies any exertional chest discomfort or shortness of breath.  No orthopnea, PND, palpitations, syncope.  Bryan [name] was seen at his primary care office on 05/09/2018 for unsteadiness and dizziness which was felt to be related to recent addition of narcotic.  On the night before last the patient awoke feeling hot and sweaty.  He tried to get up to go to the bathroom and was dizzy and very nauseous but did not vomit.  He developed pain in the low chest/epigastric area that was sharp "like something cutting".  He had no shortness of breath with this.  His pain lasted about 45 minutes and he was able to go back to sleep.  He says that he has had dizziness for a while but it had not been accompanied by nausea in the past.  He went to urgent care in the morning and was advised to go to the hospital.  He had a PET scan to  do in the afternoon and after that he went to [facility] at the urging of his wife.  He is currently not having any dizziness while he is lying in the bed.  His nausea has resolved.  He had no recurrence of the chest pain until last evening it occurred for a few minutes after his MRI and resolved spontaneously.  He has never had any shortness of breath.  Past Medical History:  Diagnosis Date  . Anemia   . Arthritis    hands, knees, cervical area. Back pain. Rheumatoid arthritis- weekly injections.  [name] BPH (benign prostatic hypertrophy)   . Cancer ([place])   . Essential tremor   . GERD (gastroesophageal reflux disease)    reports for indigestion he uses mustard   . Hearing deficit    wears hearing aids bilateral  . HTN (hypertension)   . Obesity   . Refusal of blood transfusions as patient is Jehovah's Witness   . Right bundle branch block    history of  . Seizures (HCC)    AS A CHILD. only esential tremors now.  . Sleep apnea    cpap - settings at 9 per patient   . Ulcer     Past Surgical History:  Procedure Laterality Date  . ANTERIOR CERVICAL DECOMP/DISCECTOMY FUSION N/A 03/30/2013   Procedure: ANTERIOR CERVICAL DECOMPRESSION/DISCECTOMY FUSION 2  LEVELS;  Surgeon: Otilio Connors, MD;  Location: [facility];  Service: Neurosurgery;  Laterality: N/A;  C4-5 C5-6 Anterior cervical decompression/diskectomy/fusion/Allograft/Plate  . CHOLECYSTECTOMY N/A 12/07/2013   Procedure: LAPAROSCOPIC CHOLECYSTECTOMY WITH INTRAOPERATIVE CHOLANGIOGRAM;  Surgeon: Adin Hector, MD;  Location: [facility];  Service: General;  Laterality: N/A;  . COLONOSCOPY    . DECOMPRESSIVE LUMBAR LAMINECTOMY LEVEL 2 N/A 02/15/2015   Procedure: COMPLETE DECOMPRESSIVE LUMBAR LAMINECTOMY L4-L5/ FORAMINOTOMY TO L4 NERVE ROOT AND L5 NERVE ROOT BILATERALLY;  Surgeon: [name], MD;  Location: [facility];  Service: Orthopedics;  Laterality: N/A;  . EYE SURGERY     right, growth excision  . LUMBAR  LAMINECTOMY/DECOMPRESSION MICRODISCECTOMY Left 01/18/2016   Procedure:  DECOMPRESSION L4-L5 MICRODISCECTOMY L4-L5 ON LEFT FOR SPINAL STENOSIS;  Surgeon: [name], MD;  Location: [facility];  Service: Orthopedics;  Laterality: Left;  . PAROTIDECTOMY Left 01/24/2018   Procedure: INCISIONAL BIOPSY OF LEFT PAROTID    MASS;  Surgeon: Helayne Seminole, MD;  Location: [facility];  Service: ENT;  Laterality: Left;  . SPINE SURGERY    . TONSILLECTOMY    . VASECTOMY    . WRIST GANGLION EXCISION Left      Home Medications:  Prior to Admission medications   Medication Sig Start Date End Date Taking? Authorizing Provider  diclofenac (VOLTAREN) 75 MG EC tablet Take 75 mg by mouth 3 (three) times daily. 10/04/17  Yes [provider]  gabapentin (NEURONTIN) 300 MG capsule TAKE 1 CAPSULE BY MOUTH THREE TIMES DAILY 06/17/18  Yes Stallings, Zoe A, MD  hydrochlorothiazide (HYDRODIURIL) 25 MG tablet TAKE 25 MG BY MOUTH ONCE DAILY 02/24/18  Yes Stallings, Zoe A, MD  meloxicam (MOBIC) 15 MG tablet Take 15 mg by mouth daily. 07/11/18  Yes [provider]  metoprolol succinate (TOPROL-XL) 50 MG 24 hr tablet TAKE 1 TABLET BY MOUTH ONCE DAILY. TAKE WITH OR IMMEDIATELY FOLLOWING A MEAL Patient taking differently: Take 50 mg by mouth daily.  06/26/17  Yes [name], MD  primidone (MYSOLINE) 50 MG tablet Take 50-100 mg by mouth. 100 mg in the and 50 mg in the evening   Yes [provider]  Tamsulosin HCl (FLOMAX) 0.4 MG CAPS Take 0.4 mg by mouth at bedtime.    Yes [provider]  traMADol (ULTRAM) 50 MG tablet Take 1 tablet (50 mg total) every 8 (eight) hours as needed by mouth for severe pain. 09/27/17  Yes Harrison Mons, PA    Inpatient Medications: Scheduled Meds: . gabapentin  300 mg Oral TID  . [START ON 07/31/2018] Influenza vac split quadrivalent PF  0.5 mL Intramuscular Tomorrow-1000  . metoprolol succinate  50 mg Oral Daily  . primidone  100 mg Oral Daily   And  .  primidone  50 mg Oral QHS  . tamsulosin  0.4 mg Oral QHS   Continuous Infusions: . heparin 1,200 Units/hr (07/30/18 0205)   PRN Meds: acetaminophen, ondansetron (ZOFRAN) IV, traMADol  Allergies:    Allergies  Allergen Reactions  . Lisinopril Cough and Other (See Comments)    Cough  . 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 Marital status: Married    Spouse name: Not on file  . Number of children: 2  . Years of education: Not on file  . Highest education level: Not  on file  Occupational History  . Occupation: bus Education administrator: RETIRED  Social Needs  . Financial resource strain: Not on file  . Food insecurity:    Worry: Not on file    Inability: Not on file  . Transportation needs:    Medical: Not on file    Non-medical: Not on file  Tobacco Use  . Smoking status: Former Smoker    Types: Cigarettes    Last attempt to quit: 12/14/1972    Years since quitting: 45.6  . Smokeless tobacco: Never Used  Substance and Sexual Activity  . Alcohol use: Yes    Comment: occasionally  . Drug use: No  . Sexual activity: Yes    Birth control/protection: None  Lifestyle  . Physical activity:    Days per week: Not on file    Minutes per session: Not on file  . Stress: Not on file  Relationships  . Social connections:    Talks on phone: Not on file    Gets together: Not on file    Attends religious service: Not on file    Active member of club or organization: Not on file    Attends meetings of clubs or organizations: Not on file    Relationship status: Not on file  . Intimate partner violence:    Fear of current or ex partner: Not on file    Emotionally abused: Not on file    Physically abused: Not on file    Forced sexual activity: Not on file  Other Topics Concern  . Not on file  Social History  Narrative  . Not on file    Family History:    Family History  Problem Relation Age of Onset  . Colon cancer Maternal Grandfather   . Kidney disease Brother   . Hypertension Brother   . Diabetes Brother   . Stroke Maternal Grandmother   . Diabetes Mother   . Hypertension Mother   . CAD Father        died of MI at age 30  . Hypertension Father   . Diabetes Paternal Aunt        x 4 aunts     ROS:  Please see the history of present illness.   All other ROS reviewed and negative.     Physical Exam/Data:   Vitals:   07/30/18 0200 07/30/18 0400 07/30/18 0406 07/30/18 0742  BP: (!) 156/87  (!) 148/93 136/79  Pulse: (!) 59  61 62  Resp: 17  16 20   Temp:   98.4 F (36.9 C) 97.7 F (36.5 C)  TempSrc:   Oral Oral  SpO2: 100%  100% 94%  Weight:  102 kg    Height:        Intake/Output Summary (Last 24 hours) at 07/30/2018 0845 Last data filed at 07/30/2018 0407 Gross per 24 hour  Intake -  Output 300 ml  Net -300 ml   Filed Weights   07/29/18 1810 07/30/18 0400  Weight: 103.9 kg 102 kg   Body mass index is 37.43 kg/m.  General:  Well nourished, well developed, in no acute distress HEENT: normal Lymph: no adenopathy Neck: no JVD Endocrine:  No thryomegaly Vascular: No carotid bruits; FA pulses 2+ bilaterally without bruits  Cardiac:  normal S1, S2; RRR; no murmur  Lungs:  clear to auscultation bilaterally, no wheezing, rhonchi or rales  Abd: soft, nontender, no hepatomegaly  Ext: no edema Musculoskeletal:  No deformities, BUE and BLE  strength normal and equal Skin: warm and dry  Neuro:  CNs 2-12 intact, no focal abnormalities noted Psych:  Normal affect   EKG:  The EKG was personally reviewed and demonstrates: Sinus rhythm with right bundle branch block and PAC, 60 bpm, QTC 433 Telemetry:  Telemetry was personally reviewed and demonstrates:  Sinus rhythm with PACs and rare PVC, rates in the 60s  Relevant CV Studies: none  Laboratory  Data:  Chemistry Recent Labs  Lab 07/29/18 2019  NA 138  K 4.3  CL 101  CO2 28  GLUCOSE 70  BUN 22  CREATININE 1.25*  CALCIUM 9.0  GFRNONAA 55*  GFRAA >60  ANIONGAP 9    Recent Labs  Lab 07/29/18 2019  PROT 7.6  ALBUMIN 4.0  AST 32  ALT 22  ALKPHOS 125  BILITOT 0.7   Hematology Recent Labs  Lab 07/29/18 2019 07/30/18 0631  WBC 5.4 4.6  RBC 4.62 4.29  HGB 14.5 13.0  HCT 43.5 40.2  MCV 94.2 93.7  MCH 31.4 30.3  MCHC 33.3 32.3  RDW 14.4 14.3  PLT 235 205   Cardiac Enzymes Recent Labs  Lab 07/29/18 2019 07/29/18 2315 07/30/18 0631  TROPONINI 0.06* 0.07* 0.05*   No results for input(s): TROPIPOC in the last 168 hours.  BNPNo results for input(s): BNP, PROBNP in the last 168 hours.  DDimer No results for input(s): DDIMER in the last 168 hours.  Radiology/Studies:  Dg Chest 2 View  Result Date: 07/29/2018 CLINICAL DATA:  Dizziness and shortness of breath. Marginal zone B-cell lymphoma. EXAM: CHEST - 2 VIEW COMPARISON:  01/08/2018 FINDINGS: The heart size and mediastinal contours are within normal limits. Both lungs are clear. The visualized skeletal structures are unremarkable. IMPRESSION: No active cardiopulmonary disease. Electronically Signed   By: Earle Gell M.D.   On: 07/29/2018 20:43   Mr Brain Wo Contrast  Result Date: 07/29/2018 CLINICAL DATA:  Ataxia and possible stroke EXAM: MRI HEAD WITHOUT CONTRAST MRA HEAD WITHOUT CONTRAST TECHNIQUE: Multiplanar, multiecho pulse sequences of the brain and surrounding structures were obtained without intravenous contrast. Angiographic images of the head were obtained using MRA technique without contrast. COMPARISON:  None. FINDINGS: MRI HEAD FINDINGS BRAIN: There is no acute infarct, acute hemorrhage or mass effect. The midline structures are normal. There are no old infarcts. Multifocal white matter hyperintensity, most commonly due to chronic ischemic microangiopathy. The CSF spaces are normal for age, with no  hydrocephalus. Susceptibility-sensitive sequences show no chronic microhemorrhage or superficial siderosis. SKULL AND UPPER CERVICAL SPINE: The visualized skull base, calvarium, upper cervical spine and extracranial soft tissues are normal. SINUSES/ORBITS: No fluid levels or advanced mucosal thickening. No mastoid or middle ear effusion. The orbits are normal. MRA HEAD FINDINGS The MRA is degraded by motion. Intracranial internal carotid arteries: Normal. Anterior cerebral arteries: Normal. Middle cerebral arteries: Normal. Posterior communicating arteries: Present bilaterally. Posterior cerebral arteries: Normal. Basilar artery: Normal. Vertebral arteries: Left dominant. Normal. Superior cerebellar arteries: Normal. Inferior cerebellar arteries: Normal. IMPRESSION: 1. No acute intracranial abnormality. 2. Mild multifocal white matter hyperintensity, most commonly indicating chronic small vessel ischemia. 3. Motion degraded MRA without occlusion or high-grade stenosis. Electronically Signed   By: Ulyses Jarred M.D.   On: 07/29/2018 21:38   Mr Jodene Nam Head (cerebral Arteries)  Result Date: 07/29/2018 CLINICAL DATA:  Ataxia and possible stroke EXAM: MRI HEAD WITHOUT CONTRAST MRA HEAD WITHOUT CONTRAST TECHNIQUE: Multiplanar, multiecho pulse sequences of the brain and surrounding structures were obtained without intravenous contrast. Angiographic images  of the head were obtained using MRA technique without contrast. COMPARISON:  None. FINDINGS: MRI HEAD FINDINGS BRAIN: There is no acute infarct, acute hemorrhage or mass effect. The midline structures are normal. There are no old infarcts. Multifocal white matter hyperintensity, most commonly due to chronic ischemic microangiopathy. The CSF spaces are normal for age, with no hydrocephalus. Susceptibility-sensitive sequences show no chronic microhemorrhage or superficial siderosis. SKULL AND UPPER CERVICAL SPINE: The visualized skull base, calvarium, upper cervical spine  and extracranial soft tissues are normal. SINUSES/ORBITS: No fluid levels or advanced mucosal thickening. No mastoid or middle ear effusion. The orbits are normal. MRA HEAD FINDINGS The MRA is degraded by motion. Intracranial internal carotid arteries: Normal. Anterior cerebral arteries: Normal. Middle cerebral arteries: Normal. Posterior communicating arteries: Present bilaterally. Posterior cerebral arteries: Normal. Basilar artery: Normal. Vertebral arteries: Left dominant. Normal. Superior cerebellar arteries: Normal. Inferior cerebellar arteries: Normal. IMPRESSION: 1. No acute intracranial abnormality. 2. Mild multifocal white matter hyperintensity, most commonly indicating chronic small vessel ischemia. 3. Motion degraded MRA without occlusion or high-grade stenosis. Electronically Signed   By: Ulyses Jarred M.D.   On: 07/29/2018 21:38    Assessment and Plan:   Chest pain -Patient with dizziness that is been present for a couple of months but with new nausea and episode of low chest/epigastric pain on the night before last.  One mild episode of the chest discomfort last evening that lasted for a few minutes.  No exertional symptoms and no shortness of breath at all -Troponins mildly elevated and flat pattern: 0.06, 0.07, 0.05 -Labs are unremarkable except for very mildly elevated serum creatinine at 1.25.  The patient is not anemic at this time. -Chest x-ray shows no acute cardiopulmonary disease -CVD risk factors include HTN, obesity and sedentary  -The patient's symptoms are mostly atypical, but given his elevated troponin levels he should have an ischemic work-up.  We will land for cardiac CTA if available, otherwise nuclear stress test. -He is on heparin drip and no current chest pain  Dizziness -Patient with a couple of months history of dizziness but now with new onset of nausea -He also had some right leg weakness 2 nights ago. -MR of the brain shows no acute intracranial abnormality,  mild multifocal white matter hyperintensity most commonly indicating chronic small vessel ischemia -No arrhythmias on the monitor to be contributing to his dizziness  Hypertension -Home management includes hydrochlorothiazide 25 mg daily, Toprol-XL 50 mg daily -He is continued on Toprol but hydrochlorothiazide is on hold. -Pt reports good BP control his primary care visits. -Blood pressure is elevated here while off hydrochlorothiazide.  Continue to monitor  Obstructive sleep apnea -Patient reports compliance with CPAP  Essential tremors -Patient takes primidone for this.    For questions or updates, please contact [place] Please consult [email or web address] for contact info under     Signed, Daune Perch, NP  07/30/2018 8:45 AM   I have seen and examined the patient along with Daune Perch, NP.  I have reviewed the chart, notes and new data.  I agree with PA/NP's note.  Key new complaints: symptoms are atypical, limited coronary risk factors (mostly age and gender) Key examination changes: obese, widely split S2 , otw normal CV exam Key new findings / data: minimally abnormal troponin in plateau pattern.  PLAN: Intermediate suspicion for cardiac cause of symptoms. Plan coronary CT angio today.  Sanda Klein, MD, [place] [phone] 07/30/2018, 11:01 AM

## 2018-07-30 NOTE — Progress Notes (Signed)
ANTICOAGULATION CONSULT NOTE - Follow-Up Consult  Pharmacy Consult for Hepain Indication: chest pain/ACS  Allergies  Allergen Reactions  . Lisinopril Cough and Other (See Comments)    Cough  . Other Other (See Comments)    BLOOD PRODUCT REFUSAL (patient is a Jehovah's Witness) BLOOD PRODUCT REFUSAL (patient is a Jehovah's Witness)    Patient Measurements: Height: 5\' 5"  (165.1 cm) Weight: 224 lb 14.4 oz (102 kg) IBW/kg (Calculated) : 61.5 Heparin Dosing Weight:   Vital Signs: Temp: 97.7 F (36.5 C) (09/18 0742) Temp Source: Oral (09/18 0742) BP: 163/83 (09/18 1000) Pulse Rate: 61 (09/18 1000)  Labs: Recent Labs    07/29/18 2019 07/29/18 2315 07/30/18 0631 07/30/18 0806 07/30/18 0854  HGB 14.5  --  13.0  --   --   HCT 43.5  --  40.2  --   --   PLT 235  --  205  --   --   HEPARINUNFRC  --   --   --   --  0.66  CREATININE 1.25*  --   --   --   --   TROPONINI 0.06* 0.07* 0.05* 0.04*  --     Estimated Creatinine Clearance: 57.8 mL/min (A) (by C-G formula based on SCr of 1.25 mg/dL (H)).   Medical History: Past Medical History:  Diagnosis Date  . Anemia   . Arthritis    hands, knees, cervical area. Back pain. Rheumatoid arthritis- weekly injections.  [name] BPH (benign prostatic hypertrophy)   . Cancer ([place])   . Essential tremor   . GERD (gastroesophageal reflux disease)    reports for indigestion he uses mustard   . Hearing deficit    wears hearing aids bilateral  . HTN (hypertension)   . Obesity   . Refusal of blood transfusions as patient is Jehovah's Witness   . Right bundle branch block    history of  . Seizures (HCC)    AS A CHILD. only esential tremors now.  . Sleep apnea    cpap - settings at 9 per patient   . Ulcer     Medications:  Infusions:  . heparin 1,200 Units/hr (07/30/18 0205)    Assessment: 54 YOM who presented on 9/18 with CP/dizziness and concern for ACS. Pharmacy consulted to dose Heparin for anticoagulation.   Heparin level this  morning is therapeutic (HL 0.66, goal of 0.3-0.7). CBC wnl - no bleeding noted.   Goal of Therapy:  Heparin level 0.3-0.7 units/ml Monitor platelets by anticoagulation protocol: Yes   Plan:  - Continue Heparin at 1200 units/hr (12 ml/hr) - Will continue to monitor for any signs/symptoms of bleeding and will follow up with heparin level in 8 hours to confirm therapeutic  Thank you for allowing pharmacy to be a part of this patient's care.  Alycia Rossetti, PharmD, BCPS Clinical Pharmacist Pager: [phone] Clinical phone for 07/30/2018 from 7a-3:30p: [phone] If after 3:30p, please call main pharmacy at: [phone] Please check AMION for all [place] numbers 07/30/2018 10:48 AM

## 2018-07-31 LAB — URINE CULTURE: Culture: NO GROWTH

## 2018-08-29 ENCOUNTER — Inpatient Hospital Stay: Payer: Medicare HMO | Attending: Hematology

## 2018-08-29 ENCOUNTER — Inpatient Hospital Stay: Payer: Medicare HMO | Admitting: Hematology

## 2018-08-29 DIAGNOSIS — Z87891 Personal history of nicotine dependence: Secondary | ICD-10-CM | POA: Insufficient documentation

## 2018-08-29 DIAGNOSIS — C8331 Diffuse large B-cell lymphoma, lymph nodes of head, face, and neck: Secondary | ICD-10-CM | POA: Insufficient documentation

## 2018-08-29 DIAGNOSIS — M069 Rheumatoid arthritis, unspecified: Secondary | ICD-10-CM | POA: Insufficient documentation

## 2018-08-29 DIAGNOSIS — Z7982 Long term (current) use of aspirin: Secondary | ICD-10-CM | POA: Insufficient documentation

## 2018-08-29 DIAGNOSIS — I10 Essential (primary) hypertension: Secondary | ICD-10-CM | POA: Insufficient documentation

## 2018-08-29 DIAGNOSIS — Z79899 Other long term (current) drug therapy: Secondary | ICD-10-CM | POA: Insufficient documentation

## 2018-09-01 ENCOUNTER — Telehealth: Payer: Self-pay | Admitting: Hematology

## 2018-09-01 ENCOUNTER — Telehealth: Payer: Self-pay

## 2018-09-01 NOTE — Telephone Encounter (Signed)
Spoke to pt regarding upcoming appts per 10/21 sch message.  °

## 2018-09-01 NOTE — Telephone Encounter (Signed)
LVM w/ pt regarding missed appt 08/29/18. Scheduling msg sent to call pt for new LAB MO and EST PT 20 appt w/ Dr. Irene Limbo based on availability. Pt encouraged to call and reschedule.

## 2018-09-02 DIAGNOSIS — M0579 Rheumatoid arthritis with rheumatoid factor of multiple sites without organ or systems involvement: Secondary | ICD-10-CM | POA: Diagnosis not present

## 2018-09-02 DIAGNOSIS — E669 Obesity, unspecified: Secondary | ICD-10-CM | POA: Diagnosis not present

## 2018-09-02 DIAGNOSIS — M545 Low back pain: Secondary | ICD-10-CM | POA: Diagnosis not present

## 2018-09-02 DIAGNOSIS — M2550 Pain in unspecified joint: Secondary | ICD-10-CM | POA: Diagnosis not present

## 2018-09-02 DIAGNOSIS — C884 Extranodal marginal zone B-cell lymphoma of mucosa-associated lymphoid tissue [MALT-lymphoma]: Secondary | ICD-10-CM | POA: Diagnosis not present

## 2018-09-02 DIAGNOSIS — I7300 Raynaud's syndrome without gangrene: Secondary | ICD-10-CM | POA: Diagnosis not present

## 2018-09-02 DIAGNOSIS — Z6837 Body mass index (BMI) 37.0-37.9, adult: Secondary | ICD-10-CM | POA: Diagnosis not present

## 2018-09-02 DIAGNOSIS — G8929 Other chronic pain: Secondary | ICD-10-CM | POA: Diagnosis not present

## 2018-09-08 ENCOUNTER — Telehealth: Payer: Self-pay | Admitting: Hematology

## 2018-09-08 ENCOUNTER — Other Ambulatory Visit: Payer: Self-pay | Admitting: Family Medicine

## 2018-09-08 ENCOUNTER — Inpatient Hospital Stay: Payer: Medicare HMO

## 2018-09-08 ENCOUNTER — Inpatient Hospital Stay (HOSPITAL_BASED_OUTPATIENT_CLINIC_OR_DEPARTMENT_OTHER): Payer: Medicare HMO | Admitting: Hematology

## 2018-09-08 VITALS — BP 142/77 | HR 62 | Temp 98.1°F | Resp 20 | Ht 65.0 in | Wt 223.6 lb

## 2018-09-08 DIAGNOSIS — Z87891 Personal history of nicotine dependence: Secondary | ICD-10-CM | POA: Diagnosis not present

## 2018-09-08 DIAGNOSIS — Z79899 Other long term (current) drug therapy: Secondary | ICD-10-CM

## 2018-09-08 DIAGNOSIS — I10 Essential (primary) hypertension: Secondary | ICD-10-CM

## 2018-09-08 DIAGNOSIS — Z7982 Long term (current) use of aspirin: Secondary | ICD-10-CM

## 2018-09-08 DIAGNOSIS — C8580 Other specified types of non-Hodgkin lymphoma, unspecified site: Secondary | ICD-10-CM

## 2018-09-08 DIAGNOSIS — E538 Deficiency of other specified B group vitamins: Secondary | ICD-10-CM

## 2018-09-08 DIAGNOSIS — C8331 Diffuse large B-cell lymphoma, lymph nodes of head, face, and neck: Secondary | ICD-10-CM | POA: Diagnosis not present

## 2018-09-08 DIAGNOSIS — M069 Rheumatoid arthritis, unspecified: Secondary | ICD-10-CM | POA: Diagnosis not present

## 2018-09-08 LAB — CMP (CANCER CENTER ONLY)
ALBUMIN: 3.6 g/dL (ref 3.5–5.0)
ALK PHOS: 152 U/L — AB (ref 38–126)
ALT: 16 U/L (ref 0–44)
ANION GAP: 7 (ref 5–15)
AST: 20 U/L (ref 15–41)
BUN: 18 mg/dL (ref 8–23)
CO2: 30 mmol/L (ref 22–32)
Calcium: 9 mg/dL (ref 8.9–10.3)
Chloride: 102 mmol/L (ref 98–111)
Creatinine: 1.3 mg/dL — ABNORMAL HIGH (ref 0.61–1.24)
GFR, Est AFR Am: >60 mL/min (ref >60)
GFR, Estimated: 53 mL/min — ABNORMAL LOW (ref >60)
GLUCOSE: 70 mg/dL (ref 70–99)
Potassium: 4.2 mmol/L (ref 3.5–5.1)
SODIUM: 139 mmol/L (ref 135–145)
Total Bilirubin: 0.2 mg/dL — ABNORMAL LOW (ref 0.3–1.2)
Total Protein: 7.4 g/dL (ref 6.5–8.1)

## 2018-09-08 LAB — CBC WITH DIFFERENTIAL/PLATELET
Abs Immature Granulocytes: 0.01 10*3/uL (ref 0.00–0.07)
Basophils Absolute: 0 10*3/uL (ref 0.0–0.1)
Basophils Relative: 0 %
Eosinophils Absolute: 0.1 10*3/uL (ref 0.0–0.5)
Eosinophils Relative: 3 %
HCT: 41 % (ref 39.0–52.0)
Hemoglobin: 13.1 g/dL (ref 13.0–17.0)
IMMATURE GRANULOCYTES: 0 %
Lymphocytes Relative: 27 %
Lymphs Abs: 1.3 10*3/uL (ref 0.7–4.0)
MCH: 30.7 pg (ref 26.0–34.0)
MCHC: 32 g/dL (ref 30.0–36.0)
MCV: 96 fL (ref 80.0–100.0)
MONOS PCT: 11 %
Monocytes Absolute: 0.5 10*3/uL (ref 0.1–1.0)
NEUTROS PCT: 59 %
Neutro Abs: 2.8 10*3/uL (ref 1.7–7.7)
Platelets: 205 10*3/uL (ref 150–400)
RBC: 4.27 MIL/uL (ref 4.22–5.81)
RDW: 14 % (ref 11.5–15.5)
WBC: 4.8 10*3/uL (ref 4.0–10.5)
nRBC: 0 % (ref 0.0–0.2)

## 2018-09-08 LAB — LACTATE DEHYDROGENASE: LDH: 178 U/L (ref 98–192)

## 2018-09-08 LAB — VITAMIN B12: Vitamin B-12: 353 pg/mL (ref 180–914)

## 2018-09-08 NOTE — Telephone Encounter (Signed)
Courtesy refill until appointment  

## 2018-09-08 NOTE — Telephone Encounter (Signed)
Patient called and advised he will need an appointment before refilling the requested medication, patient verbalized understanding. Appointment scheduled for Monday, 10/06/18 at 1040 with Dr. Nolon Rod. Patient says he is out of his medication, advised it will be refilled until his appointment, he verbalized understanding.

## 2018-09-08 NOTE — Patient Instructions (Signed)
Thank you for choosing [facility] to provide your oncology and hematology care.  To afford each patient quality time with our providers, please arrive 30 minutes before your scheduled appointment time.  If you arrive late for your appointment, you may be asked to reschedule.  We strive to give you quality time with our providers, and arriving late affects you and other patients whose appointments are after yours.    If you are a no show for multiple scheduled visits, you may be dismissed from the clinic at the providers discretion.     Again, thank you for choosing  Cancer Center, our hope is that these requests will decrease the amount of time that you wait before being seen by our physicians.  ______________________________________________________________________   Should you have questions after your visit to the  Cancer Center, please contact our office at [phone] between the hours of 8:30 and 4:30 p.m.    Voicemails left after 4:30p.m will not be returned until the following business day.     For prescription refill requests, please have your pharmacy contact us directly.  Please also try to allow 48 hours for prescription requests.     Please contact the scheduling department for questions regarding scheduling.  For scheduling of procedures such as PET scans, CT scans, MRI, Ultrasound, etc please contact central scheduling at [phone].     Resources For Cancer Patients and Caregivers:    Oncolink.org:  A wonderful resource for patients and healthcare providers for information regarding your disease, ways to tract your treatment, what to expect, etc.      American Cancer Society:  800-227-2345  Can help patients locate various types of support and financial assistance   Cancer Care: 1-800-813-HOPE (4673) Provides financial assistance, online support groups, medication/co-pay assistance.     [place] DSS:  336-641-3447 Where to apply  for food stamps, Medicaid, and utility assistance   Medicare Rights Center: 800-333-4114 Helps people with Medicare understand their rights and benefits, navigate the Medicare system, and secure the quality healthcare they deserve   SCAT: 336-333-6589 [place] [employer]'s shared-ride transportation service for eligible riders who have a disability that prevents them from riding the fixed route bus.     For additional information on assistance programs please contact our social worker:   Abigail Elmore:  [phone]  

## 2018-09-08 NOTE — Progress Notes (Signed)
 HEMATOLOGY ONCOLOGY CLINIC NOTE  Date of Service: 04/29/18   Patient Care Team: Shaw, Eva N, MD as PCP - General (Family Medicine) Croitoru, Mihai, MD as PCP - Cardiology (Cardiology) Young, Michelle G, PA-C as Physician Assistant (Emergency Medicine) Gioffre, Ronald, MD as Consulting Physician (Orthopedic Surgery) Kale, Gautam Kishore, MD as Consulting Physician (Oncology) Marcellino, Amanda J, MD as Consulting Physician (Otolaryngology) Evans, Brent M, DPM as Consulting Physician (Podiatry)  CHIEF COMPLAINTS F/u for  Marginal Zone lymphoma   HISTORY OF PRESENTING ILLNESS:   Bryan [name] 72 y.o. male is here because of a referral from ENT Dr. Amanda Marcellino regarding a concern of MALT lymphoma.   He is accompanied today by 4 members of his family. The pt reports that he is doing well overall. The pt's PCP is Dr. Shaw. The pt sees Dr. Michelle Young for his Rheumatology. The pt went off of humira a month before his mass appeared two months. The pt takes Methotrexate 15 mg once each week for his rheumatoid arthritis. He notes that he took humira for 8-10 months, and hasn't taken it in about 3 months. He began taking prednisone 15mg q day, 2 months ago.   He reports having high blood pressure and takes amlodipine, metoprolol for it. He reports having had back and neck surgery for discs.  He also notes having essential tremors in his hands for which he takes primidone.  He reports having run out of flomax. He has had some PTSD from his time in the [place] war.  He notes he has had drenching night sweats for the last 15 years that are of intermittent frequency. He denies any other medical issues.    He first noticed swelling of his left cheek about two months ago. He notes that it was "hard like a rock." He was placed on prednisone for about a month after going to the doctor, with some relief. He then had his needle Bx recorded below.   He reports gum pain that began this morning,  and also reports that his tongue feels as if it has bumps on it. He reports taking folic acid 1mg each day.    On 10/29/17 the pt had a neck CT revealing Multifocal left parotid lesion. Multiple ill-defined enhancing nodules in the left parotid gland. Asymmetric enhancing lymph nodes in the left neck are not pathologically enlarged however given the asymmetry and the left parotid lesion, they could represent neoplastic spread of parotid malignancy or lymphoma. Tissue sampling recommended.  Of note prior to the patient's visit, pt has had a biopsy of his left parotid gland completed on 12/03/17 with results revealing Atypical lymphoid proliferation. 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 -   B cell clonality study was positive for clonality.   On review of systems, pt reports gum pain, fatigue (not recent), occasional night sweats, ankle pain, and denies abdominal pains, back pain, flank pain, leg swelling, swollen or painful joints beside ankles.   On PMHx the pt has had rheumatoid arthritis for 6 years and HTN. He notes that he has anemia and is a Jehova's Witness I- which he notes means he would not want to consider blood products  On Surgical Hx the pt had back surgery. On Social Hx the pt quit smoking in 1976 after smoking about 8 cigarettes each day for 15 years. He notes drinking ETOH about once   a week. He denies chemical or radiation exposure, except for agent orange.  On Family Hx the pt notes high blood pressure, DM, but denies autoimmune conditions, cancers, or blood disorders.   INTERVAL HISTORY:   [name] is here for a scheduled follow-up of his marginal zone lymphoma post Rituxan treatment. The patient's last visit with [place] was on 04/29/18. The pt reports that he is doing well overall.   The pt reports that he has not developed any new  concerns in the interim. He ha begun a vegan diet in the interim and has lost 13 pounds intentionally. He endorses good energy levels. He notes that he discontinued Vitamin B12 and 65m aspirin.   The pt notes that his RA has been stable overall, and is taking Meloxicam and Gabapentin currently. He is not taking Plaquenil or Methotrexate. He notes that he is considering beginning Rituxan with neurology.   Lab results today (09/08/18) of CBC w/diff, CMP is as follows: all values are WNL except for Creatinine at 1.30, Alk Phos at 152, Total Bilirubin at 0.2. 09/08/18 LDH is pending 09/08/18 Vitamin B12 is pending  On review of systems, pt reports stable RA, intentional weight loss, eating well, stable ankle swelling, and denies noticing any new lumps or bumps, fevers, chills, night sweats, concerns for infections, abdominal pains, and any other symptoms.    REVIEW OF SYSTEMS:    A 10+ POINT REVIEW OF SYSTEMS WAS OBTAINED including neurology, dermatology, psychiatry, cardiac, respiratory, lymph, extremities, GI, GU, Musculoskeletal, constitutional, breasts, reproductive, HEENT.  All pertinent positives are noted in the HPI.  All others are negative.   MEDICAL HISTORY:  Past Medical History:  Diagnosis Date  . Anemia   . Arthritis    hands, knees, cervical area. Back pain. Rheumatoid arthritis- weekly injections.  .Marland KitchenBPH (benign prostatic hypertrophy)   [place]   . Essential tremor   . GERD (gastroesophageal reflux disease)    reports for indigestion he uses mustard   . Hearing deficit    wears hearing aids bilateral  . HTN (hypertension)   . Obesity   . Refusal of blood transfusions as patient is Jehovah's Witness   . Right bundle branch block    history of  . Seizures (HCC)    AS A CHILD. only esential tremors now.  . Sleep apnea    cpap - settings at 9 per patient   . Ulcer   JEHOVA's WITNESS  SURGICAL HISTORY: Past Surgical History:  Procedure Laterality Date  .  ANTERIOR CERVICAL DECOMP/DISCECTOMY FUSION N/A 03/30/2013   Procedure: ANTERIOR CERVICAL DECOMPRESSION/DISCECTOMY FUSION 2 LEVELS;  Surgeon: JOtilio Connors MD;  [facility];  Service: Neurosurgery;  Laterality: N/A;  C4-5 C5-6 Anterior cervical decompression/diskectomy/fusion/Allograft/Plate  . CHOLECYSTECTOMY N/A 12/07/2013   Procedure: LAPAROSCOPIC CHOLECYSTECTOMY WITH INTRAOPERATIVE CHOLANGIOGRAM;  Surgeon: SAdin Hector MD;  Location: [facility];  Service: General;  Laterality: N/A;  . COLONOSCOPY    . DECOMPRESSIVE LUMBAR LAMINECTOMY LEVEL 2 N/A 02/15/2015   Procedure: COMPLETE DECOMPRESSIVE LUMBAR LAMINECTOMY L4-L5/ FORAMINOTOMY TO L4 NERVE ROOT AND L5 NERVE ROOT BILATERALLY;  Surgeon: RLatanya Maudlin MD;  Location: [facility];  Service: Orthopedics;  Laterality: N/A;  . EYE SURGERY     right, growth excision  . LUMBAR LAMINECTOMY/DECOMPRESSION MICRODISCECTOMY Left 01/18/2016   Procedure:  DECOMPRESSION L4-L5 MICRODISCECTOMY L4-L5 ON LEFT FOR SPINAL STENOSIS;  Surgeon: RLatanya Maudlin MD;  Location: [facility];  Service: Orthopedics;  Laterality: Left;  . PAROTIDECTOMY Left 01/24/2018  Procedure: INCISIONAL BIOPSY OF LEFT PAROTID    MASS;  Surgeon: Helayne Seminole, MD;  Location: [facility];  Service: ENT;  Laterality: Left;  . SPINE SURGERY    . TONSILLECTOMY    . VASECTOMY    . WRIST GANGLION EXCISION Left     SOCIAL HISTORY: Social History   Socioeconomic History  . Marital status: Married    Spouse name: Not on file  . Number of children: 2  . Years of education: Not on file  . Highest education level: Not on file  Occupational History  . Occupation: bus Education administrator: RETIRED  Social Needs  . Financial resource strain: Not on file  . Food insecurity:    Worry: Not on file    Inability: Not on file  . Transportation needs:    Medical: Not on file    Non-medical: Not on file  Tobacco Use  . Smoking status: Former Smoker    Types: Cigarettes    Last attempt to  quit: 12/14/1972    Years since quitting: 45.7  . Smokeless tobacco: Never Used  Substance and Sexual Activity  . Alcohol use: Yes    Comment: occasionally  . Drug use: No  . Sexual activity: Yes    Birth control/protection: None  Lifestyle  . Physical activity:    Days per week: Not on file    Minutes per session: Not on file  . Stress: Not on file  Relationships  . Social connections:    Talks on phone: Not on file    Gets together: Not on file    Attends religious service: Not on file    Active member of club or organization: Not on file    Attends meetings of clubs or organizations: Not on file    Relationship status: Not on file  . Intimate partner violence:    Fear of current or ex partner: Not on file    Emotionally abused: Not on file    Physically abused: Not on file    Forced sexual activity: Not on file  Other Topics Concern  . Not on file  Social History Narrative  . Not on file    FAMILY HISTORY: Family History  Problem Relation Age of Onset  . Colon cancer Maternal Grandfather   . Kidney disease Brother   . Hypertension Brother   . Diabetes Brother   . Stroke Maternal Grandmother   . Diabetes Mother   . Hypertension Mother   . CAD Father        died of MI at age 75  . Hypertension Father   . Diabetes Paternal Aunt        x 4 aunts    ALLERGIES:  is allergic to lisinopril and other.  MEDICATIONS:  Current Outpatient Medications  Medication Sig Dispense Refill  . aspirin EC 81 MG tablet Take 1 tablet (81 mg total) by mouth daily. 30 tablet 0  . gabapentin (NEURONTIN) 300 MG capsule TAKE 1 CAPSULE BY MOUTH THREE TIMES DAILY 270 capsule 0  . hydrochlorothiazide (HYDRODIURIL) 25 MG tablet TAKE 25 MG BY MOUTH ONCE DAILY 90 tablet 3  . meloxicam (MOBIC) 15 MG tablet Take 15 mg by mouth daily.  1  . metoprolol succinate (TOPROL-XL) 50 MG 24 hr tablet TAKE 1 TABLET BY MOUTH ONCE DAILY. TAKE WITH OR  IMMEDIATELY FOLLOWING A MEAL 30 tablet 0  . omeprazole  (PRILOSEC) 20 MG capsule Take 1 capsule (20 mg total) by mouth  daily. 30 capsule 0  . primidone (MYSOLINE) 50 MG tablet Take 50-100 mg by mouth. 100 mg in the and 50 mg in the evening    . Tamsulosin HCl (FLOMAX) 0.4 MG CAPS Take 0.4 mg by mouth at bedtime.     . traMADol (ULTRAM) 50 MG tablet Take 1 tablet (50 mg total) every 8 (eight) hours as needed by mouth for severe pain. 30 tablet 0   No current facility-administered medications for this visit.     PHYSICAL EXAMINATION: ECOG PERFORMANCE STATUS: 1 - Symptomatic but completely ambulatory  Vitals:   09/08/18 1331  BP: (!) 142/77  Pulse: 62  Resp: 20  Temp: 98.1 F (36.7 C)  SpO2: 96%   Filed Weights   09/08/18 1331  Weight: 223 lb 9.6 oz (101.4 kg)   .  GENERAL:alert, in no acute distress and comfortable SKIN: no acute rashes, no significant lesions EYES: conjunctiva are pink and non-injected, sclera anicteric OROPHARYNX: MMM, no exudates, no oropharyngeal erythema or ulceration NECK: supple, no JVD LYMPH:  no palpable lymphadenopathy in the cervical, axillary or inguinal regions LUNGS: clear to auscultation b/l with normal respiratory effort HEART: regular rate & rhythm ABDOMEN:  normoactive bowel sounds , non tender, not distended. No palpable hepatosplenomegaly.  Extremity: 1+ pedal edema PSYCH: alert & oriented x 3 with fluent speech NEURO: no focal motor/sensory deficits    LABS CBC Latest Ref Rng & Units 09/08/2018 07/30/2018 07/29/2018  WBC 4.0 - 10.5 K/uL 4.8 4.6 5.4  Hemoglobin 13.0 - 17.0 g/dL 13.1 13.0 14.5  Hematocrit 39.0 - 52.0 % 41.0 40.2 43.5  Platelets 150 - 400 K/uL 205 205 235   CBC    Component Value Date/Time   WBC 4.8 09/08/2018 1316   RBC 4.27 09/08/2018 1316   HGB 13.1 09/08/2018 1316   HGB 13.7 03/18/2018 0848   HCT 41.0 09/08/2018 1316   HCT 42.7 12/19/2017 1416   PLT 205 09/08/2018 1316   PLT 195 03/18/2018 0848   MCV 96.0 09/08/2018 1316   MCV 94.0 05/09/2018 1805   MCH 30.7  09/08/2018 1316   MCHC 32.0 09/08/2018 1316   RDW 14.0 09/08/2018 1316   LYMPHSABS 1.3 09/08/2018 1316   MONOABS 0.5 09/08/2018 1316   EOSABS 0.1 09/08/2018 1316   BASOSABS 0.0 09/08/2018 1316    CMP Latest Ref Rng & Units 09/08/2018 07/29/2018 04/29/2018  Glucose 70 - 99 mg/dL 70 70 121  BUN 8 - 23 mg/dL _0 Creatinine 0.61 - 1.24 mg/dL 1.30(H) 1.25(H) 1.27  Sodium 135 - 145 mmol/L 139 138 142  Potassium 3.5 - 5.1 mmol/L 4.2 4.3 3.8  Chloride 98 - 111 mmol/L 102 101 101  CO2 22 - 32 mmol/L 30 28 32(H)  Calcium 8.9 - 10.3 mg/dL 9.0 9.0 9.3  Total Protein 6.5 - 8.1 g/dL 7.4 7.6 7.1  Total Bilirubin 0.3 - 1.2 mg/dL 0.2(L) 0.7 0.2  Alkaline Phos 38 - 126 U/L 152(H) 125 135  AST 15 - 41 U/L 20 32 19  ALT 0 - 44 U/L _1 Lab Results  Component Value Date   LDH 236 04/29/2018    Component     Latest Ref Rng & Units 12/19/2017  Iron     42 - 163 ug/dL 130  TIBC     202 - 409 ug/dL 331  Saturation Ratios     42 - 163 % 39 (L)  UIBC     ug/dL  201  Folate, Hemolysate     Not Estab. ng/mL 565.3  HCT     37.5 - 51.0 % 42.7  Folate, RBC     >498 ng/mL 1,324  Vitamin B12     180 - 914 pg/mL 264  Ferritin     22 - 316 ng/mL 42  Hepatitis B Surface Ag     Negative Negative  Hep B Core Ab, Tot     Negative Negative  HCV Ab     0.0 - 0.9 s/co ratio <0.1   LABORATORY DATA:    01/27/18 Flow Cytometry:   01/27/18 Parotid Gland Surgical Pathology   RADIOGRAPHIC STUDIES: I have personally reviewed the radiological images as listed and agreed with the findings in the report. No results found.  ASSESSMENT & PLAN:   71 y.o. is a  male with    1.Recently diagnosed Stage IIE Marginal-zone lymphoma involving the left parotid gland, left cervical LN and mediastinal lymph nodes. . Lab Results  Component Value Date   LDH 236 04/29/2018   Hep C neg and Hep B -He completed 4 dose Rituxan 02/07/18-02/28/18.  LDH has normalized and the Parotid mass is no longer  palpable.  04/22/18 PET/CT revealed complete metabolic response to therapy. No residual hypermetabolic mass or lymphadenopathy.   2. Jehovas Witness- declines any use of blood products.  PLAN  -EPO and other non-blood products would be considered if his RBC numbers drop, as opposed to PRBC since the pt notes he is opposed to blood transfusions given that he is a Jehovah's Witness. No indication at this time. -Discussed that there is not a strong indication for maintenance Rituxan from a Lymphoma standpoint -Rheumatology may decide there is a place for Rituxan in his RA treatment -Discussed that if/when lymphoma comes back, we would treat pt with Rituxan again -Discussed pt labwork today, 09/08/18; blood counts and chemistries are stable -The pt shows no clinical or lab progression of Marginal Zone Lymphoma at this time. -Begin taking Vitamin B12 again -Recommend that pt speak with PCP about if pt should remain off 81mg aspirin which he has discontinued on his own -Will see the pt back in 3 months      2. Rheumatoid Arthritis Sed rate has normalized. Hopefully his Rituxan might help his RA as well. - off  MTX and prednisone and is currently on plaquenil per his rheumatologist, Dr. Beekman and PA Michelle Young.  -Rituxan might help with some control of his RA. Other treatments based on symptoms post-Rituxan. -I encouraged him to be more active with at least walking and possibly start PT or Silver Sneakers program before considering any stronger medication. He agreed.   3. Low B12 levels -continue B12 SL 1000mcg SL daily  4. Wheezing - resolved currently PFTs 02/26/2018 - Pulmonary Function Diagnosis: Minimal Obstructive Airways Disease Minimal Restriction - Moderate Diffusion Defect PLAN -f/u with PCP to mx this. -will defer to rheumatology for evaluation/management of possible RA associated early ILD -recommended patient focus on increased physical activity and diet to try to achieve  body weight.   RTC with Dr Kale with labs in 3 months   All of the patient's questions were answered with apparent satisfaction. The patient knows to call the clinic with any problems, questions or concerns.  The total time spent in the appt was 30 minutes and more than 50% was on counseling and direct patient cares.   Gautam Kale MD MS AAHIVMS SCH CTH Hematology/Oncology Physician [facility]  (  Office):       [phone] (Work cell):  [phone] (Fax):           [phone]  I, Schuyler Bain, am acting as a scribe for Dr. Kale  .I have reviewed the above documentation for accuracy and completeness, and I agree with the above. .Gautam Kishore Kale MD  

## 2018-09-08 NOTE — Telephone Encounter (Signed)
Scheduled appt per 10/28 los - gave patient AVS and calender per los.    

## 2018-09-15 DIAGNOSIS — J310 Chronic rhinitis: Secondary | ICD-10-CM | POA: Diagnosis not present

## 2018-09-15 DIAGNOSIS — H903 Sensorineural hearing loss, bilateral: Secondary | ICD-10-CM | POA: Diagnosis not present

## 2018-09-22 DIAGNOSIS — L97829 Non-pressure chronic ulcer of other part of left lower leg with unspecified severity: Secondary | ICD-10-CM | POA: Diagnosis not present

## 2018-09-22 DIAGNOSIS — I872 Venous insufficiency (chronic) (peripheral): Secondary | ICD-10-CM | POA: Diagnosis not present

## 2018-09-22 DIAGNOSIS — L97819 Non-pressure chronic ulcer of other part of right lower leg with unspecified severity: Secondary | ICD-10-CM | POA: Diagnosis not present

## 2018-09-22 DIAGNOSIS — R609 Edema, unspecified: Secondary | ICD-10-CM | POA: Diagnosis not present

## 2018-09-25 DIAGNOSIS — M5416 Radiculopathy, lumbar region: Secondary | ICD-10-CM | POA: Insufficient documentation

## 2018-09-26 ENCOUNTER — Other Ambulatory Visit: Payer: Self-pay | Admitting: Student

## 2018-09-26 DIAGNOSIS — M5416 Radiculopathy, lumbar region: Secondary | ICD-10-CM

## 2018-10-06 ENCOUNTER — Other Ambulatory Visit: Payer: Self-pay | Admitting: Student

## 2018-10-06 ENCOUNTER — Ambulatory Visit
Admission: RE | Admit: 2018-10-06 | Discharge: 2018-10-06 | Disposition: A | Discharge status: Home / Self Care | Payer: Medicare HMO | Source: Ambulatory Visit | Attending: Student | Admitting: Student

## 2018-10-06 ENCOUNTER — Ambulatory Visit: Payer: Self-pay | Admitting: Family Medicine

## 2018-10-06 ENCOUNTER — Other Ambulatory Visit: Payer: Self-pay | Admitting: Family Medicine

## 2018-10-06 DIAGNOSIS — M48061 Spinal stenosis, lumbar region without neurogenic claudication: Secondary | ICD-10-CM | POA: Diagnosis not present

## 2018-10-06 DIAGNOSIS — M5416 Radiculopathy, lumbar region: Secondary | ICD-10-CM

## 2018-10-06 MED ORDER — IOPAMIDOL (ISOVUE-M 200) INJECTION 41%
1.0000 mL | Freq: Once | INTRAMUSCULAR | Status: AC
  Administered 2018-10-06: 1 mL via EPIDURAL

## 2018-10-06 MED ORDER — METHYLPREDNISOLONE ACETATE 40 MG/ML INJ SUSP (RADIOLOG
120.0000 mg | Freq: Once | INTRAMUSCULAR | Status: AC
  Administered 2018-10-06: 120 mg via EPIDURAL

## 2018-10-06 NOTE — Discharge Instructions (Signed)

## 2018-10-07 NOTE — Telephone Encounter (Signed)
Requested Prescriptions  Pending Prescriptions Disp Refills  . metoprolol succinate (TOPROL-XL) 50 MG 24 hr tablet [Pharmacy Med Name: METOPROLOL ER 50MG   TAB] 30 tablet 0    Sig: TAKE 1 TABLET BY MOUTH ONCE DAILY WITH  OR  IMMEDIATELY  FOLLOWING  A  MEAL     Cardiovascular:  Beta Blockers Failed - 10/06/2018  6:49 AM      Failed - Last BP in normal range    BP Readings from Last 1 Encounters:  10/06/18 (!) 182/90         Passed - Last Heart Rate in normal range    Pulse Readings from Last 1 Encounters:  10/06/18 (!) 59         Passed - Valid encounter within last 6 months    Recent Outpatient Visits          5 months ago General unsteadiness   [facility] at [facility], PA-C   5 months ago Right upper quadrant abdominal pain   Primary Care at [facility], [place], [place]   7 months ago Essential hypertension   [facility] Care at [facility], Arlie Solomons, MD   9 months ago Coughing blood   [facility] at [place], PA-C   11 months ago Arthritis involving multiple sites   Primary Care at [facility], Ines Bloomer, MD      Future Appointments            In 3 weeks [name], MD [facility] at [place], [facility]

## 2018-10-21 DIAGNOSIS — L080 Pyoderma: Secondary | ICD-10-CM | POA: Diagnosis not present

## 2018-10-21 DIAGNOSIS — B009 Herpesviral infection, unspecified: Secondary | ICD-10-CM | POA: Diagnosis not present

## 2018-10-27 ENCOUNTER — Ambulatory Visit: Payer: Medicare HMO | Admitting: Family Medicine

## 2018-10-31 ENCOUNTER — Other Ambulatory Visit: Payer: Self-pay

## 2018-10-31 ENCOUNTER — Ambulatory Visit (INDEPENDENT_AMBULATORY_CARE_PROVIDER_SITE_OTHER): Payer: Medicare HMO | Admitting: Family Medicine

## 2018-10-31 ENCOUNTER — Ambulatory Visit: Payer: Self-pay | Admitting: Family Medicine

## 2018-10-31 ENCOUNTER — Encounter: Payer: Self-pay | Admitting: Family Medicine

## 2018-10-31 VITALS — BP 152/75 | HR 71 | Temp 98.0°F | Resp 16 | Ht 64.17 in | Wt 212.0 lb

## 2018-10-31 DIAGNOSIS — Z125 Encounter for screening for malignant neoplasm of prostate: Secondary | ICD-10-CM

## 2018-10-31 DIAGNOSIS — M79671 Pain in right foot: Secondary | ICD-10-CM

## 2018-10-31 DIAGNOSIS — R202 Paresthesia of skin: Secondary | ICD-10-CM

## 2018-10-31 DIAGNOSIS — M19272 Secondary osteoarthritis, left ankle and foot: Secondary | ICD-10-CM | POA: Diagnosis not present

## 2018-10-31 DIAGNOSIS — M19271 Secondary osteoarthritis, right ankle and foot: Secondary | ICD-10-CM

## 2018-10-31 DIAGNOSIS — I10 Essential (primary) hypertension: Secondary | ICD-10-CM

## 2018-10-31 DIAGNOSIS — M79672 Pain in left foot: Secondary | ICD-10-CM | POA: Diagnosis not present

## 2018-10-31 MED ORDER — GABAPENTIN 300 MG PO CAPS: 300.0000 mg | ORAL_CAPSULE | Freq: Three times a day (TID) | ORAL | 1 refills | Status: DC

## 2018-10-31 MED ORDER — METOPROLOL SUCCINATE ER 50 MG PO TB24: ORAL_TABLET | ORAL | 3 refills | Status: DC

## 2018-10-31 MED ORDER — LOSARTAN POTASSIUM-HCTZ 50-12.5 MG PO TABS: 1.0000 | ORAL_TABLET | Freq: Every day | ORAL | 0 refills | Status: DC

## 2018-10-31 NOTE — Patient Instructions (Addendum)
I recommend calling your insurance company today to let them know that you are not aware of any doctors that ordered this.  That you were informed that Dr. Nolon Rod ordered it and when you saw her partner today, myself, I confirmed that there is no notation or order for this in your chart.  Let them know that I was concerned this was a scam and that they should not pay for this and that you will be returning to the company ASAP.  On Monday call the [facility] and let them know the same thing.  Let us know if you have any problems and need [place] to contact someone on your behalf as well.  Start losartan HCTZ which you have been on prior and tolerated well instead of the HCTZ alone.  Recheck in a full physical in about 2 months.  Also go ahead and schedule your annual wellness visit with our nurse.    If you have lab work done today you will be contacted with your lab results within the next 2 weeks.  If you have not heard from [place] then please contact us. The fastest way to get your results is to register for My Chart.   IF you received an x-ray today, you will receive an invoice from [facility]. Please contact [facility] at [phone] with questions or concerns regarding your invoice.   IF you received labwork today, you will receive an invoice from [facility]. Please contact [facility] at [phone] with questions or concerns regarding your invoice.   Our billing staff will not be able to assist you with questions regarding bills from these companies.  You will be contacted with the lab results as soon as they are available. The fastest way to get your results is to activate your My Chart account. Instructions are located on the last page of this paperwork. If you have not heard from [place] regarding the results in 2 weeks, please contact this office.

## 2018-10-31 NOTE — Progress Notes (Signed)
Subjective:    Patient: Bryan [name]  DOB: [birth year]; 72 y.o.   MRN: [id number]  Chief Complaint  Patient presents with  . Hypertension    follow-up  . Medication Refill    HPI Has severe pain in both feet, stabbing pain, when he first stands up he has to walk very slow and shuffling, then he could walk.  No numbness, tingling, or burning.   He brought in a machine and sleeves for ICD compression.   DOes not salt foot but got on a vegetarian diet and lost 20 + lbs  Medical History Past Medical History:  Diagnosis Date  . Anemia   . Arthritis    hands, knees, cervical area. Back pain. Rheumatoid arthritis- weekly injections.  [name] BPH (benign prostatic hypertrophy)   . Cancer ([place])   . Essential tremor   . GERD (gastroesophageal reflux disease)    reports for indigestion he uses mustard   . Hearing deficit    wears hearing aids bilateral  . HTN (hypertension)   . Obesity   . Refusal of blood transfusions as patient is Jehovah's Witness   . Right bundle branch block    history of  . Seizures ([facility])    AS A CHILD. only esential tremors now.  . Sleep apnea    cpap - settings at 9 per patient   . Ulcer    Past Surgical History:  Procedure Laterality Date  . ANTERIOR CERVICAL DECOMP/DISCECTOMY FUSION N/A 03/30/2013   Procedure: ANTERIOR CERVICAL DECOMPRESSION/DISCECTOMY FUSION 2 LEVELS;  Surgeon: Otilio Connors, MD;  Location: [place] [facility];  Service: Neurosurgery;  Laterality: N/A;  C4-5 C5-6 Anterior cervical decompression/diskectomy/fusion/Allograft/Plate  . CHOLECYSTECTOMY N/A 12/07/2013   Procedure: LAPAROSCOPIC CHOLECYSTECTOMY WITH INTRAOPERATIVE CHOLANGIOGRAM;  Surgeon: Adin Hector, MD;  Location: [facility];  Service: General;  Laterality: N/A;  . COLONOSCOPY    . DECOMPRESSIVE LUMBAR LAMINECTOMY LEVEL 2 N/A 02/15/2015   Procedure: COMPLETE DECOMPRESSIVE LUMBAR LAMINECTOMY L4-L5/ FORAMINOTOMY TO L4 NERVE ROOT AND L5 NERVE ROOT BILATERALLY;  Surgeon: [name], MD;   Location: [facility];  Service: Orthopedics;  Laterality: N/A;  . EYE SURGERY     right, growth excision  . LUMBAR LAMINECTOMY/DECOMPRESSION MICRODISCECTOMY Left 01/18/2016   Procedure:  DECOMPRESSION L4-L5 MICRODISCECTOMY L4-L5 ON LEFT FOR SPINAL STENOSIS;  Surgeon: [name], MD;  Location: [facility];  Service: Orthopedics;  Laterality: Left;  . PAROTIDECTOMY Left 01/24/2018   Procedure: INCISIONAL BIOPSY OF LEFT PAROTID    MASS;  Surgeon: Helayne Seminole, MD;  Location: [place];  Service: ENT;  Laterality: Left;  . SPINE SURGERY    . TONSILLECTOMY    . VASECTOMY    . WRIST GANGLION EXCISION Left    Current Outpatient Medications on File Prior to Visit  Medication Sig Dispense Refill  . aspirin EC 81 MG tablet Take 1 tablet (81 mg total) by mouth daily. 30 tablet 0  . gabapentin (NEURONTIN) 300 MG capsule TAKE 1 CAPSULE BY MOUTH THREE TIMES DAILY 270 capsule 0  . hydrochlorothiazide (HYDRODIURIL) 25 MG tablet TAKE 25 MG BY MOUTH ONCE DAILY 90 tablet 3  . meloxicam (MOBIC) 15 MG tablet Take 15 mg by mouth daily.  1  . metoprolol succinate (TOPROL-XL) 50 MG 24 hr tablet TAKE 1 TABLET BY MOUTH ONCE DAILY WITH  OR  IMMEDIATELY  FOLLOWING  A  MEAL 30 tablet 0  . omeprazole (PRILOSEC) 20 MG capsule Take 1 capsule (20 mg total) by mouth daily. 30 capsule 0  .  primidone (MYSOLINE) 50 MG tablet Take 50-100 mg by mouth. 100 mg in the and 50 mg in the evening    . Tamsulosin HCl (FLOMAX) 0.4 MG CAPS Take 0.4 mg by mouth at bedtime.     . traMADol (ULTRAM) 50 MG tablet Take 1 tablet (50 mg total) every 8 (eight) hours as needed by mouth for severe pain. 30 tablet 0   No current facility-administered medications on file prior to visit.    Allergies  Allergen Reactions  . Lisinopril Cough and Other (See Comments)    Cough  . Other Other (See Comments)    BLOOD PRODUCT REFUSAL (patient is a Jehovah's Witness) BLOOD PRODUCT REFUSAL (patient is a Jehovah's Witness)   Family History  Problem  Relation Age of Onset  . Colon cancer Maternal Grandfather   . Kidney disease Brother   . Hypertension Brother   . Diabetes Brother   . Stroke Maternal Grandmother   . Diabetes Mother   . Hypertension Mother   . CAD Father        died of MI at age 52  . Hypertension Father   . Diabetes Paternal Aunt        x 4 aunts   Social History   Socioeconomic History  . Marital status: Married    Spouse name: Not on file  . Number of children: 2  . Years of education: Not on file  . Highest education level: Not on file  Occupational History  . Occupation: bus Education administrator: RETIRED  Social Needs  . Financial resource strain: Not on file  . Food insecurity:    Worry: Not on file    Inability: Not on file  . Transportation needs:    Medical: Not on file    Non-medical: Not on file  Tobacco Use  . Smoking status: Former Smoker    Types: Cigarettes    Last attempt to quit: 12/14/1972    Years since quitting: 45.9  . Smokeless tobacco: Never Used  Substance and Sexual Activity  . Alcohol use: Yes    Comment: occasionally  . Drug use: No  . Sexual activity: Yes    Birth control/protection: None  Lifestyle  . Physical activity:    Days per week: Not on file    Minutes per session: Not on file  . Stress: Not on file  Relationships  . Social connections:    Talks on phone: Not on file    Gets together: Not on file    Attends religious service: Not on file    Active member of club or organization: Not on file    Attends meetings of clubs or organizations: Not on file    Relationship status: Not on file  Other Topics Concern  . Not on file  Social History Narrative  . Not on file   Depression screen [facility] 2/9 10/31/2018 05/09/2018 04/18/2018 02/24/2018 01/08/2018  Decreased Interest 0 0 0 0 0  Down, Depressed, Hopeless 0 0 0 0 0  PHQ - 2 Score 0 0 0 0 0  Altered sleeping - - - - -  Tired, decreased energy - - - - -  Change in appetite - - - - -  Feeling bad or failure  about yourself  - - - - -  Trouble concentrating - - - - -  Moving slowly or fidgety/restless - - - - -  Suicidal thoughts - - - - -  PHQ-9 Score - - - - -  Some recent data might be hidden    ROS As noted in HPI  Objective:  BP (!) 152/75   Pulse 71   Temp 98 F (36.7 C) (Oral)   Resp 16   Ht 5' 4.17" (1.63 m)   Wt 212 lb (96.2 kg)   SpO2 97%   BMI 36.19 kg/m  Physical Exam Constitutional:      General: He is not in acute distress.    Appearance: He is well-developed. He is not diaphoretic.  HENT:     Head: Normocephalic and atraumatic.  Eyes:     General: No scleral icterus.    Conjunctiva/sclera: Conjunctivae normal.     Pupils: Pupils are equal, round, and reactive to light.  Neck:     Musculoskeletal: Normal range of motion and neck supple.     Thyroid: No thyromegaly.  Cardiovascular:     Rate and Rhythm: Normal rate and regular rhythm.     Heart sounds: Normal heart sounds.  Pulmonary:     Effort: Pulmonary effort is normal. No respiratory distress.     Breath sounds: Normal breath sounds.  Lymphadenopathy:     Cervical: No cervical adenopathy.  Skin:    General: Skin is warm and dry.  Neurological:     Mental Status: He is alert and oriented to person, place, and time.  Psychiatric:        Behavior: Behavior normal.     [facility] Office Visit on 10/31/2018  Component Date Value Ref Range Status  . TSH 10/31/2018 1.070  0.450 - 4.500 uIU/mL Final  . Hgb A1c MFr Bld 10/31/2018 5.5  4.8 - 5.6 % Final   Comment:          Prediabetes: 5.7 - 6.4          Diabetes: >6.4          Glycemic control for adults with diabetes: <7.0   . Est. average glucose Bld gHb Est-m* 10/31/2018 111  mg/dL Final  . Prostate Specific Ag, Serum 10/31/2018 1.9  0.0 - 4.0 ng/mL Final   Comment: Roche ECLIA methodology. According to the American Urological Association, Serum PSA should decrease and remain at undetectable levels after radical prostatectomy. The AUA defines  biochemical recurrence as an initial PSA value 0.2 ng/mL or greater followed by a subsequent confirmatory PSA value 0.2 ng/mL or greater. Values obtained with different assay methods or kits cannot be used interchangeably. Results cannot be interpreted as absolute evidence of the presence or absence of malignant disease.      Assessment & Plan:   1. Paresthesia of both feet   2. Screening for prostate cancer   3. Foot pain, bilateral   4. Other secondary osteoarthritis of both feet   5. Essential hypertension    Patient will continue on current chronic medications other than changes noted above, so ok to refill when needed.   See after visit summary for patient specific instructions.  Orders Placed This Encounter  Procedures  . TSH  . Hemoglobin A1c  . PSA  . Ambulatory referral to Podiatry    Referral Priority:   Routine    Referral Type:   Consultation    Referral Reason:   Specialty Services Required    Referred to Provider:   Edrick Kins, DPM    Requested Specialty:   Podiatry    Number of Visits Requested:   1    Meds ordered this encounter  Medications  . losartan-hydrochlorothiazide (HYZAAR) 50-12.5 MG  tablet    Sig: Take 1 tablet by mouth daily.    Dispense:  90 tablet    Refill:  0    D/c hctz alone  . metoprolol succinate (TOPROL-XL) 50 MG 24 hr tablet    Sig: TAKE 1 TABLET BY MOUTH ONCE DAILY WITH  OR  IMMEDIATELY  FOLLOWING  A  MEAL    Dispense:  90 tablet    Refill:  3  . gabapentin (NEURONTIN) 300 MG capsule    Sig: Take 1 capsule (300 mg total) by mouth 3 (three) times daily.    Dispense:  270 capsule    Refill:  1    Please consider 90 day supplies to promote better adherence    Patient verbalized to me that they understand the following: diagnosis, what is being done for them, what to expect and what should be done at home.  Their questions have been answered. They understand that I am unable to predict every possible medication interaction or  adverse outcome and that if any unexpected symptoms arise, they should contact us and their pharmacist, as well as never hesitate to seek urgent/emergent care at [facility] or ER if they think it might be warranted.    Over 40 min spent in face-to-face evaluation of and consultation with patient and coordination of care.  Over 50% of this time was spent counseling this patient regarding above.  Delman Cheadle, MD, MPH [facility] [address] [place], [place]  [address] [phone] Office phone  [phone] Office fax  10/31/18 9:19 AM

## 2018-11-01 LAB — TSH: TSH: 1.07 u[IU]/mL (ref 0.450–4.500)

## 2018-11-01 LAB — PSA: Prostate Specific Ag, Serum: 1.9 ng/mL (ref 0.0–4.0)

## 2018-11-01 LAB — HEMOGLOBIN A1C
Est. average glucose Bld gHb Est-mCnc: 111 mg/dL
Hgb A1c MFr Bld: 5.5 % (ref 4.8–5.6)

## 2018-11-19 ENCOUNTER — Encounter: Payer: Self-pay | Admitting: Podiatry

## 2018-11-25 NOTE — Progress Notes (Signed)
This encounter was created in error - please disregard.

## 2018-12-03 ENCOUNTER — Other Ambulatory Visit: Payer: Self-pay

## 2018-12-03 ENCOUNTER — Encounter: Payer: Self-pay | Admitting: Podiatry

## 2018-12-08 NOTE — Progress Notes (Signed)
HEMATOLOGY ONCOLOGY CLINIC NOTE  Date of Service: 12/09/18    Patient Care Team: Shawnee Knapp, MD as PCP - General (Family Medicine) Croitoru, Dani Gobble, MD as PCP - Cardiology (Cardiology) [name] as Physician Assistant (Emergency Medicine) [name], MD as Consulting Physician (Orthopedic Surgery) [name], MD as Consulting Physician (Oncology) Helayne Seminole, MD as Consulting Physician (Otolaryngology) Edrick Kins, DPM as Consulting Physician (Podiatry)  CHIEF COMPLAINTS F/u for  Marginal Zone lymphoma   HISTORY OF PRESENTING ILLNESS:   Bryan [name] 73 y.o. male is here because of a referral from ENT [name] regarding a concern of MALT lymphoma.   He is accompanied today by 4 members of his family. The pt reports that he is doing well overall. The pt's PCP is [name]. The pt sees [name] for his Rheumatology. The pt went off of humira a month before his mass appeared two months. The pt takes Methotrexate 15 mg once each week for his rheumatoid arthritis. He notes that he took humira for 8-10 months, and hasn't taken it in about 3 months. He began taking prednisone '15mg'$  q day, 2 months ago.   He reports having high blood pressure and takes amlodipine, metoprolol for it. He reports having had back and neck surgery for discs.  He also notes having essential tremors in his hands for which he takes primidone.  He reports having run out of flomax. He has had some PTSD from his time in the [place] war.  He notes he has had drenching night sweats for the last 15 years that are of intermittent frequency. He denies any other medical issues.    He first noticed swelling of his left cheek about two months ago. He notes that it was "hard like a rock." He was placed on prednisone for about a month after going to the doctor, with some relief. He then had his needle Bx recorded below.   He reports gum pain that began this morning,  and also reports that his tongue feels as if it has bumps on it. He reports taking folic acid '1mg'$  each day.    On 10/29/17 the pt had a neck CT revealing Multifocal left parotid lesion. Multiple ill-defined enhancing nodules in the left parotid gland. Asymmetric enhancing lymph nodes in the left neck are not pathologically enlarged however given the asymmetry and the left parotid lesion, they could represent neoplastic spread of parotid malignancy or lymphoma. Tissue sampling recommended.  Of note prior to the patient's visit, pt has had a biopsy of his left parotid gland completed on 12/03/17 with results revealing Atypical lymphoid proliferation. 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 -   B cell clonality study was positive for clonality.  On review of systems, pt reports gum pain, fatigue (not recent), occasional night sweats, ankle pain, and denies abdominal pains, back pain, flank pain, leg swelling, swollen or painful joints beside ankles.   On PMHx the pt has had rheumatoid arthritis for 6 years and HTN. He notes that he has anemia and is a Acupuncturist Witness I- which he notes means he would not want to consider blood products  On Surgical Hx the pt had back surgery. On Social Hx the pt quit smoking in 1976 after smoking about 8 cigarettes each day for 15 years. He notes drinking ETOH about once  a week. He denies chemical or radiation exposure, except for agent orange.  On Family Hx the pt notes high blood pressure, DM, but denies autoimmune conditions, cancers, or blood disorders.   INTERVAL HISTORY:   Bryan [name] is here for a scheduled follow-up of his marginal zone lymphoma post Rituxan treatment. The patient's last visit with [place] was on 09/08/18. The pt reports that he is doing well overall.   The pt reports that he has continued on his diet and  has now decreased his weight through diet to 210 pounds, from 230 pounds. He has enjoyed good energy levels in the interim. He notes that "every now and then," he has a "sharp shooting pain" in one of his legs. He also had a shot in the interim in his back. Besides this, he denies developing any other concerns.   The pt notes that he changed insurance and won't be able to continue seeing his Rheumatologist [name]. He is not taking anything for his rheumatoid arthritis. He notes that his Rituxan   Lab results today (12/09/18) of CBC w/diff and CMP is as follows: all values are WNL except for Glucose at 113, Alk Phos at 178, Total Bilirubin at 0.2. 12/09/18 LDH is . Lab Results  Component Value Date   LDH 171 12/09/2018   On review of systems, pt reports intentional weight loss, good energy levels, and denies noticing any new lumps or bumps, abdominal pains, leg swelling, fevers, chills, night sweats, and any other symptoms.    REVIEW OF SYSTEMS:    A 10+ POINT REVIEW OF SYSTEMS WAS OBTAINED including neurology, dermatology, psychiatry, cardiac, respiratory, lymph, extremities, GI, GU, Musculoskeletal, constitutional, breasts, reproductive, HEENT.  All pertinent positives are noted in the HPI.  All others are negative.   MEDICAL HISTORY:  Past Medical History:  Diagnosis Date  . Anemia   . Arthritis    hands, knees, cervical area. Back pain. Rheumatoid arthritis- weekly injections.  [name] BPH (benign prostatic hypertrophy)   . Cancer ([place])   . Essential tremor   . GERD (gastroesophageal reflux disease)    reports for indigestion he uses mustard   . Hearing deficit    wears hearing aids bilateral  . HTN (hypertension)   . Obesity   . Refusal of blood transfusions as patient is Jehovah's Witness   . Right bundle branch block    history of  . Seizures ([facility])    AS A CHILD. only esential tremors now.  . Sleep apnea    cpap - settings at 9 per patient   . Ulcer   JEHOVA's  WITNESS  SURGICAL HISTORY: Past Surgical History:  Procedure Laterality Date  . ANTERIOR CERVICAL DECOMP/DISCECTOMY FUSION N/A 03/30/2013   Procedure: ANTERIOR CERVICAL DECOMPRESSION/DISCECTOMY FUSION 2 LEVELS;  Surgeon: Otilio Connors, MD;  Location: [facility];  Service: Neurosurgery;  Laterality: N/A;  C4-5 C5-6 Anterior cervical decompression/diskectomy/fusion/Allograft/Plate  . CHOLECYSTECTOMY N/A 12/07/2013   Procedure: LAPAROSCOPIC CHOLECYSTECTOMY WITH INTRAOPERATIVE CHOLANGIOGRAM;  Surgeon: Adin Hector, MD;  Location: [facility];  Service: General;  Laterality: N/A;  . COLONOSCOPY    . DECOMPRESSIVE LUMBAR LAMINECTOMY LEVEL 2 N/A 02/15/2015   Procedure: COMPLETE DECOMPRESSIVE LUMBAR LAMINECTOMY L4-L5/ FORAMINOTOMY TO L4 NERVE ROOT AND L5 NERVE ROOT BILATERALLY;  Surgeon: [name], MD;  Location: [facility];  Service: Orthopedics;  Laterality: N/A;  . EYE SURGERY     right, growth excision  . LUMBAR LAMINECTOMY/DECOMPRESSION MICRODISCECTOMY Left 01/18/2016   Procedure:  DECOMPRESSION L4-L5 MICRODISCECTOMY  L4-L5 ON LEFT FOR SPINAL STENOSIS;  Surgeon: [name], MD;  Location: [facility];  Service: Orthopedics;  Laterality: Left;  . PAROTIDECTOMY Left 01/24/2018   Procedure: INCISIONAL BIOPSY OF LEFT PAROTID    MASS;  Surgeon: Helayne Seminole, MD;  Location: [facility];  Service: ENT;  Laterality: Left;  . SPINE SURGERY    . TONSILLECTOMY    . VASECTOMY    . WRIST GANGLION EXCISION Left     SOCIAL HISTORY: Social History   Socioeconomic History  . Marital status: Married    Spouse name: Not on file  . Number of children: 2  . Years of education: Not on file  . Highest education level: Not on file  Occupational History  . Occupation: bus Education administrator: RETIRED  Social Needs  . Financial resource strain: Not on file  . Food insecurity:    Worry: Not on file    Inability: Not on file  . Transportation needs:    Medical: Not on file    Non-medical: Not on file  Tobacco  Use  . Smoking status: Former Smoker    Types: Cigarettes    Last attempt to quit: 12/14/1972    Years since quitting: 46.0  . Smokeless tobacco: Never Used  Substance and Sexual Activity  . Alcohol use: Yes    Comment: occasionally  . Drug use: No  . Sexual activity: Yes    Birth control/protection: None  Lifestyle  . Physical activity:    Days per week: Not on file    Minutes per session: Not on file  . Stress: Not on file  Relationships  . Social connections:    Talks on phone: Not on file    Gets together: Not on file    Attends religious service: Not on file    Active member of club or organization: Not on file    Attends meetings of clubs or organizations: Not on file    Relationship status: Not on file  . Intimate partner violence:    Fear of current or ex partner: Not on file    Emotionally abused: Not on file    Physically abused: Not on file    Forced sexual activity: Not on file  Other Topics Concern  . Not on file  Social History Narrative  . Not on file    FAMILY HISTORY: Family History  Problem Relation Age of Onset  . Colon cancer Maternal Grandfather   . Kidney disease Brother   . Hypertension Brother   . Diabetes Brother   . Stroke Maternal Grandmother   . Diabetes Mother   . Hypertension Mother   . CAD Father        died of MI at age 22  . Hypertension Father   . Diabetes Paternal Aunt        x 4 aunts    ALLERGIES:  is allergic to lisinopril and other.  MEDICATIONS:  Current Outpatient Medications  Medication Sig Dispense Refill  . aspirin EC 81 MG tablet Take 1 tablet (81 mg total) by mouth daily. 30 tablet 0  . citalopram (CELEXA) 20 MG tablet Take 20 mg by mouth daily.    [name] gabapentin (NEURONTIN) 300 MG capsule Take 1 capsule (300 mg total) by mouth 3 (three) times daily. 270 capsule 1  . HYDROcodone-acetaminophen (NORCO/VICODIN) 5-325 MG tablet Take 1 tablet by mouth every 8 (eight) hours as needed for moderate pain.     [name]  losartan-hydrochlorothiazide (HYZAAR)  50-12.5 MG tablet Take 1 tablet by mouth daily. 90 tablet 0  . meloxicam (MOBIC) 15 MG tablet Take 15 mg by mouth daily.  1  . metoprolol succinate (TOPROL-XL) 50 MG 24 hr tablet TAKE 1 TABLET BY MOUTH ONCE DAILY WITH  OR  IMMEDIATELY  FOLLOWING  A  MEAL 90 tablet 3  . omeprazole (PRILOSEC) 20 MG capsule Take 1 capsule (20 mg total) by mouth daily. 30 capsule 0  . primidone (MYSOLINE) 50 MG tablet Take 50-100 mg by mouth. 100 mg in the and 50 mg in the evening    . Tamsulosin HCl (FLOMAX) 0.4 MG CAPS Take 0.4 mg by mouth at bedtime.     . valACYclovir (VALTREX) 500 MG tablet TAKE 1 CAPLET BY MOUTH TWICE DAILY FOR 5 7 DAYS AS NEEDED FOR FLARES     No current facility-administered medications for this visit.     PHYSICAL EXAMINATION: ECOG PERFORMANCE STATUS: 1 - Symptomatic but completely ambulatory  Vitals:   12/09/18 1409  BP: 125/82  Pulse: 66  Resp: 18  Temp: 98.2 F (36.8 C)  SpO2: 96%   Filed Weights   12/09/18 1409  Weight: 210 lb 12.8 oz (95.6 kg)    GENERAL:alert, in no acute distress and comfortable SKIN: no acute rashes, no significant lesions EYES: conjunctiva are pink and non-injected, sclera anicteric OROPHARYNX: MMM, no exudates, no oropharyngeal erythema or ulceration NECK: supple, no JVD LYMPH:  no palpable lymphadenopathy in the cervical, axillary or inguinal regions LUNGS: clear to auscultation b/l with normal respiratory effort HEART: regular rate & rhythm ABDOMEN:  normoactive bowel sounds , non tender, not distended. No palpable hepatosplenomegaly.  Extremity: 1+ pedal edema PSYCH: alert & oriented x 3 with fluent speech NEURO: no focal motor/sensory deficits   LABS CBC Latest Ref Rng & Units 12/09/2018 09/08/2018 07/30/2018  WBC 4.0 - 10.5 K/uL 5.8 4.8 4.6  Hemoglobin 13.0 - 17.0 g/dL 13.3 13.1 13.0  Hematocrit 39.0 - 52.0 % 40.9 41.0 40.2  Platelets 150 - 400 K/uL 205 205 205   CBC    Component Value Date/Time    WBC 5.8 12/09/2018 1350   RBC 4.26 12/09/2018 1350   HGB 13.3 12/09/2018 1350   HGB 13.7 03/18/2018 0848   HCT 40.9 12/09/2018 1350   HCT 42.7 12/19/2017 1416   PLT 205 12/09/2018 1350   PLT 195 03/18/2018 0848   MCV 96.0 12/09/2018 1350   MCV 94.0 05/09/2018 1805   MCH 31.2 12/09/2018 1350   MCHC 32.5 12/09/2018 1350   RDW 13.7 12/09/2018 1350   LYMPHSABS 1.4 12/09/2018 1350   MONOABS 0.4 12/09/2018 1350   EOSABS 0.1 12/09/2018 1350   BASOSABS 0.0 12/09/2018 1350    CMP Latest Ref Rng & Units 12/09/2018 09/08/2018 07/29/2018  Glucose 70 - 99 mg/dL 113(H) 70 70  BUN 8 - 23 mg/dL '18 18 22  '$ Creatinine 0.61 - 1.24 mg/dL 1.15 1.30(H) 1.25(H)  Sodium 135 - 145 mmol/L 140 139 138  Potassium 3.5 - 5.1 mmol/L 3.6 4.2 4.3  Chloride 98 - 111 mmol/L 101 102 101  CO2 22 - 32 mmol/L '29 30 28  '$ Calcium 8.9 - 10.3 mg/dL 8.9 9.0 9.0  Total Protein 6.5 - 8.1 g/dL 7.6 7.4 7.6  Total Bilirubin 0.3 - 1.2 mg/dL 0.2(L) 0.2(L) 0.7  Alkaline Phos 38 - 126 U/L 178(H) 152(H) 125  AST 15 - 41 U/L 19 20 32  ALT 0 - 44 U/L '16 16 22    '$ Lab Results  Component Value Date   LDH 171 12/09/2018    LABORATORY DATA:    01/27/18 Flow Cytometry:   01/27/18 Parotid Gland Surgical Pathology   RADIOGRAPHIC STUDIES: I have personally reviewed the radiological images as listed and agreed with the findings in the report. No results found.  ASSESSMENT & PLAN:   73 y.o. is a  male with    1.Recently diagnosed Stage IIE Marginal-zone lymphoma involving the left parotid gland, left cervical LN and mediastinal lymph nodes. . Lab Results  Component Value Date   LDH 178 09/08/2018   Hep C neg and Hep B -He completed 4 dose Rituxan 02/07/18-02/28/18.  LDH has normalized and the Parotid mass is no longer palpable.  04/22/18 PET/CT revealed complete metabolic response to therapy. No residual hypermetabolic mass or lymphadenopathy.   2. Jehovas Witness- declines any use of blood products.  PLAN   -Discussed pt labwork today, 12/09/18; blood counts are all normal -12/09/18 LDH is wnl . Lab Results  Component Value Date   LDH 171 12/09/2018   -The pt shows no clinical or lab progression of his Marginal Zone Lymphoma at this time.  -No indication for further treatment at this time.   -Will repeat imaging one year from his last June 2019 scans -Recommend bone density study with PCP or Rheumatology if not done in the last two years, and optimize calcium and vitamin D replacement as necessary, with PCP  -EPO and other non-blood products would be considered if his RBC numbers drop, as opposed to PRBC since the pt notes he is opposed to blood transfusions given that he is a Jehovah's Witness. No indication at this time. -Discussed that there is not a strong indication for maintenance Rituxan from a Lymphoma standpoint -Rheumatology may decide there is a place for Rituxan in his RA treatment -Continue taking Vitamin B12  -Recommend that pt speak with PCP about if pt should remain off '81mg'$  aspirin which he has discontinued on his own -Will see the pt back in 4 months    2. Rheumatoid Arthritis  Rituxan seems to have helped his RA as well. - off  MTX and prednisone and is currently on plaquenil per his rheumatologist, Dr. Amil Amen and PA Leafy Kindle.  -I encouraged him to be more active with at least walking and possibly start PT or Silver Sneakers program.  3. Low B12 levels -continue B12 SL 1037mg SL daily  4. Wheezing - resolved currently PFTs 02/26/2018 - Pulmonary Function Diagnosis: Minimal Obstructive Airways Disease Minimal Restriction - Moderate Diffusion Defect PLAN -f/u with PCP to mx this. -will defer to rheumatology for evaluation/management of possible RA associated early ILD -recommended patient focus on increased physical activity and diet to try to achieve body weight.   RTC with Dr KIrene Limboin 4 months with labs    All of the patient's questions were answered with  apparent satisfaction. The patient knows to call the clinic with any problems, questions or concerns.  The total time spent in the appt was 27 minutes and more than 50% was on counseling and direct patient cares.   GSullivan LoneMD MRoteAAHIVMS SMed[facility]CFirst [facility] LPHematology/Oncology Physician [facility] [place] (Office):       [phone](Work cell):  [phone](Fax):           [phone] I, SBaldwin [place] am acting as a scribe for Dr. GSullivan Lone   .I have reviewed the above documentation for accuracy and completeness, and I agree with the  above. .[name] MD

## 2018-12-09 ENCOUNTER — Telehealth: Payer: Self-pay | Admitting: Hematology

## 2018-12-09 ENCOUNTER — Inpatient Hospital Stay: Payer: Medicare HMO

## 2018-12-09 ENCOUNTER — Inpatient Hospital Stay: Payer: Medicare HMO | Attending: Hematology | Admitting: Hematology

## 2018-12-09 VITALS — BP 125/82 | HR 66 | Temp 98.2°F | Resp 18 | Ht 64.17 in | Wt 210.8 lb

## 2018-12-09 DIAGNOSIS — M069 Rheumatoid arthritis, unspecified: Secondary | ICD-10-CM | POA: Diagnosis not present

## 2018-12-09 DIAGNOSIS — Z79899 Other long term (current) drug therapy: Secondary | ICD-10-CM

## 2018-12-09 DIAGNOSIS — C8580 Other specified types of non-Hodgkin lymphoma, unspecified site: Secondary | ICD-10-CM

## 2018-12-09 DIAGNOSIS — Z9221 Personal history of antineoplastic chemotherapy: Secondary | ICD-10-CM | POA: Insufficient documentation

## 2018-12-09 DIAGNOSIS — C884 Extranodal marginal zone B-cell lymphoma of mucosa-associated lymphoid tissue [MALT-lymphoma]: Secondary | ICD-10-CM | POA: Diagnosis not present

## 2018-12-09 LAB — CMP (CANCER CENTER ONLY)
ALBUMIN: 3.6 g/dL (ref 3.5–5.0)
ALT: 16 U/L (ref 0–44)
AST: 19 U/L (ref 15–41)
Alkaline Phosphatase: 178 U/L — ABNORMAL HIGH (ref 38–126)
Anion gap: 10 (ref 5–15)
BUN: 18 mg/dL (ref 8–23)
CO2: 29 mmol/L (ref 22–32)
CREATININE: 1.15 mg/dL (ref 0.61–1.24)
Calcium: 8.9 mg/dL (ref 8.9–10.3)
Chloride: 101 mmol/L (ref 98–111)
GFR, Est AFR Am: >60 mL/min (ref >60)
GFR, Estimated: >60 mL/min (ref >60)
Glucose, Bld: 113 mg/dL — ABNORMAL HIGH (ref 70–99)
Potassium: 3.6 mmol/L (ref 3.5–5.1)
Sodium: 140 mmol/L (ref 135–145)
Total Bilirubin: 0.2 mg/dL — ABNORMAL LOW (ref 0.3–1.2)
Total Protein: 7.6 g/dL (ref 6.5–8.1)

## 2018-12-09 LAB — CBC WITH DIFFERENTIAL/PLATELET
Abs Immature Granulocytes: 0.01 10*3/uL (ref 0.00–0.07)
Basophils Absolute: 0 10*3/uL (ref 0.0–0.1)
Basophils Relative: 0 %
Eosinophils Absolute: 0.1 10*3/uL (ref 0.0–0.5)
Eosinophils Relative: 2 %
HCT: 40.9 % (ref 39.0–52.0)
Hemoglobin: 13.3 g/dL (ref 13.0–17.0)
Immature Granulocytes: 0 %
Lymphocytes Relative: 23 %
Lymphs Abs: 1.4 10*3/uL (ref 0.7–4.0)
MCH: 31.2 pg (ref 26.0–34.0)
MCHC: 32.5 g/dL (ref 30.0–36.0)
MCV: 96 fL (ref 80.0–100.0)
MONOS PCT: 7 %
Monocytes Absolute: 0.4 10*3/uL (ref 0.1–1.0)
Neutro Abs: 3.9 10*3/uL (ref 1.7–7.7)
Neutrophils Relative %: 68 %
Platelets: 205 10*3/uL (ref 150–400)
RBC: 4.26 MIL/uL (ref 4.22–5.81)
RDW: 13.7 % (ref 11.5–15.5)
WBC: 5.8 10*3/uL (ref 4.0–10.5)
nRBC: 0 % (ref 0.0–0.2)

## 2018-12-09 LAB — LACTATE DEHYDROGENASE: LDH: 171 U/L (ref 98–192)

## 2018-12-09 NOTE — Telephone Encounter (Signed)
Gave avs and calendar ° °

## 2018-12-16 NOTE — Progress Notes (Signed)
This encounter was created in error - please disregard.

## 2019-01-12 ENCOUNTER — Telehealth: Payer: Self-pay | Admitting: Family Medicine

## 2019-01-12 NOTE — Telephone Encounter (Signed)
mychart message sent to pt about their appointment with Dr Shaw °

## 2019-01-16 DIAGNOSIS — M51369 Other intervertebral disc degeneration, lumbar region without mention of lumbar back pain or lower extremity pain: Secondary | ICD-10-CM | POA: Insufficient documentation

## 2019-01-16 DIAGNOSIS — M5136 Other intervertebral disc degeneration, lumbar region: Secondary | ICD-10-CM | POA: Insufficient documentation

## 2019-01-30 ENCOUNTER — Ambulatory Visit: Payer: Medicare HMO | Admitting: Family Medicine

## 2019-03-15 ENCOUNTER — Other Ambulatory Visit: Payer: Self-pay | Admitting: Family Medicine

## 2019-04-06 ENCOUNTER — Other Ambulatory Visit: Payer: Self-pay | Admitting: Family Medicine

## 2019-04-10 NOTE — Progress Notes (Signed)
HEMATOLOGY ONCOLOGY CLINIC NOTE  Date of Service: 04/13/19    Patient Care Team: [name], MD as PCP - General (Internal Medicine) Croitoru, Dani Gobble, MD as PCP - Cardiology (Cardiology) [name] as Physician Assistant (Emergency Medicine) [name], MD as Consulting Physician (Orthopedic Surgery) [name], MD as Consulting Physician (Oncology) Helayne Seminole, MD as Consulting Physician (Otolaryngology) Edrick Kins, DPM as Consulting Physician (Podiatry)  CHIEF COMPLAINTS F/u for  Marginal Zone lymphoma   HISTORY OF PRESENTING ILLNESS:   Bryan [name] 73 y.o. male is here because of a referral from ENT [name] regarding a concern of MALT lymphoma.   He is accompanied today by 4 members of his family. The pt reports that he is doing well overall. The pt's PCP is [name]. The pt sees [name] for his Rheumatology. The pt went off of humira a month before his mass appeared two months. The pt takes Methotrexate 15 mg once each week for his rheumatoid arthritis. He notes that he took humira for 8-10 months, and hasn't taken it in about 3 months. He began taking prednisone 79m q day, 2 months ago.   He reports having high blood pressure and takes amlodipine, metoprolol for it. He reports having had back and neck surgery for discs.  He also notes having essential tremors in his hands for which he takes primidone.  He reports having run out of flomax. He has had some PTSD from [place]  He notes he has had drenching night sweats for the last 15 years that are of intermittent frequency. He denies any other medical issues.    He first noticed swelling of his left cheek about two months ago. He notes that it was "hard like a rock." He was placed on prednisone for about a month after going to the doctor, with some relief. He then had his needle Bx recorded below.   He reports gum pain that began this  morning, and also reports that his tongue feels as if it has bumps on it. He reports taking folic acid 160meach day.    On 10/29/17 the pt had a neck CT revealing Multifocal left parotid lesion. Multiple ill-defined enhancing nodules in the left parotid gland. Asymmetric enhancing lymph nodes in the left neck are not pathologically enlarged however given the asymmetry and the left parotid lesion, they could represent neoplastic spread of parotid malignancy or lymphoma. Tissue sampling recommended.  Of note prior to the patient's visit, pt has had a biopsy of his left parotid gland completed on 12/03/17 with results revealing Atypical lymphoid proliferation. 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 -   B cell clonality study was positive for clonality.  On review of systems, pt reports gum pain, fatigue (not recent), occasional night sweats, ankle pain, and denies abdominal pains, back pain, flank pain, leg swelling, swollen or painful joints beside ankles.   On PMHx the pt has had rheumatoid arthritis for 6 years and HTN. He notes that he has anemia and is a JeAcupuncturistitness I- which he notes means he would not want to consider blood products  On Surgical Hx the pt had back surgery. On Social Hx the pt quit smoking in 1976 after smoking about 8 cigarettes each day for 15 years. He notes drinking ETOH about once  a week. He denies chemical or radiation exposure, except for agent orange.  On Family Hx the pt notes high blood pressure, DM, but denies autoimmune conditions, cancers, or blood disorders.   INTERVAL HISTORY:   Bryan [name] is here for a scheduled follow-up of his marginal zone lymphoma post Rituxan treatment. The patient's last visit with [place] was on 12/09/18. The pt reports that he is doing well overall.  The pt reports that he has begun care with Dr.  Tamera Punt for his Rheumatoid arthritis. He notes that his hands are swollen and are painful in the mornings recently. He notes that he was stopped on Plaquenil and began two doses Rituxan every other week with Dr. Tamera Punt.   He denies any other concerns and denies fevers, chills, night sweats, unexpected weight loss. The pt also denies having developed any new lumps or bumps.   Lab results today (04/13/19) of CBC w/diff and CMP is as follows: all values are WNL except for Creatinine at 1.28, Alk Phos at 142. 04/13/19 LDH at 187  On review of systems, pt reports good energy levels, swollen hands, painful joints in mornings, and denies fevers, chills, night sweats, unexpected weight loss, noticing any new lumps or bumps, mouth sores, concerns for infections, and any other symptoms.   REVIEW OF SYSTEMS:    A 10+ POINT REVIEW OF SYSTEMS WAS OBTAINED including neurology, dermatology, psychiatry, cardiac, respiratory, lymph, extremities, GI, GU, Musculoskeletal, constitutional, breasts, reproductive, HEENT.  All pertinent positives are noted in the HPI.  All others are negative.   MEDICAL HISTORY:  Past Medical History:  Diagnosis Date  . Anemia   . Arthritis    hands, knees, cervical area. Back pain. Rheumatoid arthritis- weekly injections.  [name] BPH (benign prostatic hypertrophy)   . Cancer ([place])   . Essential tremor   . GERD (gastroesophageal reflux disease)    reports for indigestion he uses mustard   . Hearing deficit    wears hearing aids bilateral  . HTN (hypertension)   . Obesity   . Refusal of blood transfusions as patient is Jehovah's Witness   . Right bundle branch block    history of  . Seizures ([facility])    AS A CHILD. only esential tremors now.  . Sleep apnea    cpap - settings at 9 per patient   . Ulcer   JEHOVA's WITNESS  SURGICAL HISTORY: Past Surgical History:  Procedure Laterality Date  . ANTERIOR CERVICAL DECOMP/DISCECTOMY FUSION N/A 03/30/2013   Procedure: ANTERIOR CERVICAL  DECOMPRESSION/DISCECTOMY FUSION 2 LEVELS;  Surgeon: Otilio Connors, MD;  Location: [facility];  Service: Neurosurgery;  Laterality: N/A;  C4-5 C5-6 Anterior cervical decompression/diskectomy/fusion/Allograft/Plate  . CHOLECYSTECTOMY N/A 12/07/2013   Procedure: LAPAROSCOPIC CHOLECYSTECTOMY WITH INTRAOPERATIVE CHOLANGIOGRAM;  Surgeon: Adin Hector, MD;  Location: [facility];  Service: General;  Laterality: N/A;  . COLONOSCOPY    . DECOMPRESSIVE LUMBAR LAMINECTOMY LEVEL 2 N/A 02/15/2015   Procedure: COMPLETE DECOMPRESSIVE LUMBAR LAMINECTOMY L4-L5/ FORAMINOTOMY TO L4 NERVE ROOT AND L5 NERVE ROOT BILATERALLY;  Surgeon: [name], MD;  Location: [facility];  Service: Orthopedics;  Laterality: N/A;  . EYE SURGERY     right, growth excision  . LUMBAR LAMINECTOMY/DECOMPRESSION MICRODISCECTOMY Left 01/18/2016   Procedure:  DECOMPRESSION L4-L5 MICRODISCECTOMY L4-L5 ON LEFT FOR SPINAL STENOSIS;  Surgeon: [name], MD;  Location: [facility];  Service: Orthopedics;  Laterality: Left;  . PAROTIDECTOMY Left 01/24/2018   Procedure: INCISIONAL BIOPSY OF LEFT PAROTID    MASS;  Surgeon: Blenda Nicely,  San Jetty, MD;  Location: [facility];  Service: ENT;  Laterality: Left;  . SPINE SURGERY    . TONSILLECTOMY    . VASECTOMY    . WRIST GANGLION EXCISION Left     SOCIAL HISTORY: Social History   Socioeconomic History  . Marital status: Married    Spouse name: Not on file  . Number of children: 2  . Years of education: Not on file  . Highest education level: Not on file  Occupational History  . Occupation: bus Education administrator: RETIRED  Social Needs  . Financial resource strain: Not on file  . Food insecurity:    Worry: Not on file    Inability: Not on file  . Transportation needs:    Medical: Not on file    Non-medical: Not on file  Tobacco Use  . Smoking status: Former Smoker    Types: Cigarettes    Last attempt to quit: 12/14/1972    Years since quitting: 46.3  . Smokeless tobacco: Never Used  Substance  and Sexual Activity  . Alcohol use: Yes    Comment: occasionally  . Drug use: No  . Sexual activity: Yes    Birth control/protection: None  Lifestyle  . Physical activity:    Days per week: Not on file    Minutes per session: Not on file  . Stress: Not on file  Relationships  . Social connections:    Talks on phone: Not on file    Gets together: Not on file    Attends religious service: Not on file    Active member of club or organization: Not on file    Attends meetings of clubs or organizations: Not on file    Relationship status: Not on file  . Intimate partner violence:    Fear of current or ex partner: Not on file    Emotionally abused: Not on file    Physically abused: Not on file    Forced sexual activity: Not on file  Other Topics Concern  . Not on file  Social History Narrative  . Not on file    FAMILY HISTORY: Family History  Problem Relation Age of Onset  . Colon cancer Maternal Grandfather   . Kidney disease Brother   . Hypertension Brother   . Diabetes Brother   . Stroke Maternal Grandmother   . Diabetes Mother   . Hypertension Mother   . CAD Father        died of MI at age 65  . Hypertension Father   . Diabetes Paternal Aunt        x 4 aunts    ALLERGIES:  is allergic to lisinopril and other.  MEDICATIONS:  Current Outpatient Medications  Medication Sig Dispense Refill  . aspirin EC 81 MG tablet Take 1 tablet (81 mg total) by mouth daily. 30 tablet 0  . citalopram (CELEXA) 20 MG tablet Take 20 mg by mouth daily.    [name] gabapentin (NEURONTIN) 300 MG capsule Take 1 capsule (300 mg total) by mouth 3 (three) times daily. 270 capsule 1  . hydrochlorothiazide (HYDRODIURIL) 25 MG tablet Take 1 tablet by mouth once daily 90 tablet 0  . HYDROcodone-acetaminophen (NORCO/VICODIN) 5-325 MG tablet Take 1 tablet by mouth every 8 (eight) hours as needed for moderate pain.     [name] losartan-hydrochlorothiazide (HYZAAR) 50-12.5 MG tablet Take 1 tablet by mouth daily.  90 tablet 0  . meloxicam (MOBIC) 15 MG tablet Take 15 mg by mouth  daily.  1  . metoprolol succinate (TOPROL-XL) 50 MG 24 hr tablet TAKE 1 TABLET BY MOUTH ONCE DAILY WITH  OR  IMMEDIATELY  FOLLOWING  A  MEAL 90 tablet 3  . omeprazole (PRILOSEC) 20 MG capsule Take 1 capsule (20 mg total) by mouth daily. 30 capsule 0  . primidone (MYSOLINE) 50 MG tablet Take 50-100 mg by mouth. 100 mg in the and 50 mg in the evening    . Tamsulosin HCl (FLOMAX) 0.4 MG CAPS Take 0.4 mg by mouth at bedtime.     . valACYclovir (VALTREX) 500 MG tablet TAKE 1 CAPLET BY MOUTH TWICE DAILY FOR 5 7 DAYS AS NEEDED FOR FLARES     No current facility-administered medications for this visit.     PHYSICAL EXAMINATION: ECOG PERFORMANCE STATUS: 1 - Symptomatic but completely ambulatory  Vitals:   04/13/19 1405  BP: (!) 185/80  Pulse: (!) 58  Resp: 17  Temp: 98.2 F (36.8 C)  SpO2: 100%   Filed Weights   04/13/19 1405  Weight: 211 lb 1.6 oz (95.8 kg)     GENERAL:alert, in no acute distress and comfortable SKIN: no acute rashes, no significant lesions EYES: conjunctiva are pink and non-injected, sclera anicteric OROPHARYNX: MMM, no exudates, no oropharyngeal erythema or ulceration NECK: supple, no JVD LYMPH:  no palpable lymphadenopathy in the cervical, axillary or inguinal regions LUNGS: clear to auscultation b/l with normal respiratory effort HEART: regular rate & rhythm ABDOMEN:  normoactive bowel sounds , non tender, not distended. No palpable hepatosplenomegaly.  Extremity: 1+ pedal edema PSYCH: alert & oriented x 3 with fluent speech NEURO: no focal motor/sensory deficits   LABS CBC Latest Ref Rng & Units 04/13/2019 12/09/2018 09/08/2018  WBC 4.0 - 10.5 K/uL 7.2 5.8 4.8  Hemoglobin 13.0 - 17.0 g/dL 13.2 13.3 13.1  Hematocrit 39.0 - 52.0 % 42.0 40.9 41.0  Platelets 150 - 400 K/uL 256 205 205   CBC    Component Value Date/Time   WBC 7.2 04/13/2019 1247   RBC 4.26 04/13/2019 1247   HGB 13.2 04/13/2019  1247   HGB 13.7 03/18/2018 0848   HCT 42.0 04/13/2019 1247   HCT 42.7 12/19/2017 1416   PLT 256 04/13/2019 1247   PLT 195 03/18/2018 0848   MCV 98.6 04/13/2019 1247   MCV 94.0 05/09/2018 1805   MCH 31.0 04/13/2019 1247   MCHC 31.4 04/13/2019 1247   RDW 14.2 04/13/2019 1247   LYMPHSABS 1.3 04/13/2019 1247   MONOABS 0.5 04/13/2019 1247   EOSABS 0.1 04/13/2019 1247   BASOSABS 0.0 04/13/2019 1247    CMP Latest Ref Rng & Units 04/13/2019 12/09/2018 09/08/2018  Glucose 70 - 99 mg/dL 79 113(H) 70  BUN 8 - 23 mg/dL 16 18 18   Creatinine 0.61 - 1.24 mg/dL 1.28(H) 1.15 1.30(H)  Sodium 135 - 145 mmol/L 143 140 139  Potassium 3.5 - 5.1 mmol/L 4.5 3.6 4.2  Chloride 98 - 111 mmol/L 103 101 102  CO2 22 - 32 mmol/L 32 29 30  Calcium 8.9 - 10.3 mg/dL 9.2 8.9 9.0  Total Protein 6.5 - 8.1 g/dL 7.9 7.6 7.4  Total Bilirubin 0.3 - 1.2 mg/dL 0.3 0.2(L) 0.2(L)  Alkaline Phos 38 - 126 U/L 142(H) 178(H) 152(H)  AST 15 - 41 U/L 18 19 20   ALT 0 - 44 U/L 16 16 16     Lab Results  Component Value Date   LDH 187 04/13/2019    LABORATORY DATA:       01/27/18  Flow Cytometry:   01/27/18 Parotid Gland Surgical Pathology   RADIOGRAPHIC STUDIES: I have personally reviewed the radiological images as listed and agreed with the findings in the report. No results found.  ASSESSMENT & PLAN:   73 y.o. is a  male with    1.Recently diagnosed Stage IIE Marginal-zone lymphoma involving the left parotid gland, left cervical LN and mediastinal lymph nodes. . Lab Results  Component Value Date   LDH 187 04/13/2019   Hep C neg and Hep B -He completed 4 dose Rituxan 02/07/18-02/28/18.  LDH has normalized and the Parotid mass is no longer palpable.  04/22/18 PET/CT revealed complete metabolic response to therapy. No residual hypermetabolic mass or lymphadenopathy.   2. Jehovas Witness- declines any use of blood products.  PLAN  -Discussed pt labwork today, 04/13/19; blood counts are normal, LDH at 187,  chemistries are stable -The pt shows no clinical or lab progression of his marginal zone lymphoma at this time.  -No indication for further treatment at this time.   -Continue follow up with Rheumatology for RA management and evaluation and decision of returning to Plaquenil vs Rituxan which could also have a bearing on his lymphoma control -Recommend bone density study with PCP or Rheumatology if not done in the last two years, and optimize calcium and vitamin D replacement as necessary, with PCP  -EPO and other non-blood products would be considered if his RBC numbers drop, as opposed to PRBC since the pt notes he is opposed to blood transfusions given that he is a Jehovah's Witness. No indication at this time. -Discussed that there is not a strong indication for maintenance Rituxan from a Lymphoma standpoint, but Rheumatology could use this -Continue taking Vitamin B12  -Recommend that pt speak with PCP about if pt should remain off 44m aspirin which he has discontinued on his own -Will see the pt back in 4 months    2. Rheumatoid Arthritis  Rituxan seems to have helped his RA as well. - off  MTX and prednisone and is currently on plaquenil per his rheumatologist, Dr. BAmil Amenand PA MLeafy Kindle  -I encouraged him to be more active with at least walking and possibly start PT or Silver Sneakers program.  3. Low B12 levels -continue B12 SL 10086m SL daily  4. Wheezing - resolved currently PFTs 02/26/2018 - Pulmonary Function Diagnosis: Minimal Obstructive Airways Disease Minimal Restriction - Moderate Diffusion Defect PLAN -f/u with PCP to mx this. -will defer to rheumatology for evaluation/management of possible RA associated early ILD -recommended patient focus on increased physical activity and diet to try to achieve body weight.   RTC with Dr KaIrene Limboith labs in 4 months   All of the patient's questions were answered with apparent satisfaction. The patient knows to call the  clinic with any problems, questions or concerns.  The total time spent in the appt was 20 minutes and more than 50% was on counseling and direct patient cares.   GaSullivan LoneD MSStrongsvilleAHIVMS SCSaint Andrews Hospital And Healthcare CenterTDigestive Health [facility] Pcematology/Oncology Physician CoBaptist Memorial Hospital - North Ms(Office):       [phone] cell):  [phone]):           [phone], ScBaldwin Jamaicaam acting as a scribe for Dr. GaSullivan Lone  .I have reviewed the above documentation for accuracy and completeness, and I agree with the above. .GBrunetta GeneraD

## 2019-04-13 ENCOUNTER — Inpatient Hospital Stay: Payer: Medicare HMO | Attending: Hematology | Admitting: Hematology

## 2019-04-13 ENCOUNTER — Other Ambulatory Visit: Payer: Self-pay

## 2019-04-13 ENCOUNTER — Inpatient Hospital Stay: Payer: Medicare HMO

## 2019-04-13 VITALS — BP 185/80 | HR 58 | Temp 98.2°F | Resp 17 | Ht 64.17 in | Wt 211.1 lb

## 2019-04-13 DIAGNOSIS — E538 Deficiency of other specified B group vitamins: Secondary | ICD-10-CM | POA: Insufficient documentation

## 2019-04-13 DIAGNOSIS — F4312 Post-traumatic stress disorder, chronic: Secondary | ICD-10-CM | POA: Diagnosis not present

## 2019-04-13 DIAGNOSIS — C8588 Other specified types of non-Hodgkin lymphoma, lymph nodes of multiple sites: Secondary | ICD-10-CM | POA: Diagnosis not present

## 2019-04-13 DIAGNOSIS — R69 Illness, unspecified: Secondary | ICD-10-CM | POA: Diagnosis not present

## 2019-04-13 DIAGNOSIS — M25579 Pain in unspecified ankle and joints of unspecified foot: Secondary | ICD-10-CM | POA: Insufficient documentation

## 2019-04-13 DIAGNOSIS — Z87891 Personal history of nicotine dependence: Secondary | ICD-10-CM | POA: Diagnosis not present

## 2019-04-13 DIAGNOSIS — K1379 Other lesions of oral mucosa: Secondary | ICD-10-CM | POA: Insufficient documentation

## 2019-04-13 DIAGNOSIS — Z833 Family history of diabetes mellitus: Secondary | ICD-10-CM | POA: Diagnosis not present

## 2019-04-13 DIAGNOSIS — C8580 Other specified types of non-Hodgkin lymphoma, unspecified site: Secondary | ICD-10-CM

## 2019-04-13 DIAGNOSIS — Z800 Family history of malignant neoplasm of digestive organs: Secondary | ICD-10-CM | POA: Diagnosis not present

## 2019-04-13 DIAGNOSIS — R61 Generalized hyperhidrosis: Secondary | ICD-10-CM | POA: Diagnosis not present

## 2019-04-13 DIAGNOSIS — Z7289 Other problems related to lifestyle: Secondary | ICD-10-CM | POA: Insufficient documentation

## 2019-04-13 DIAGNOSIS — Z841 Family history of disorders of kidney and ureter: Secondary | ICD-10-CM | POA: Insufficient documentation

## 2019-04-13 DIAGNOSIS — Z8249 Family history of ischemic heart disease and other diseases of the circulatory system: Secondary | ICD-10-CM | POA: Insufficient documentation

## 2019-04-13 DIAGNOSIS — R062 Wheezing: Secondary | ICD-10-CM | POA: Insufficient documentation

## 2019-04-13 DIAGNOSIS — M069 Rheumatoid arthritis, unspecified: Secondary | ICD-10-CM | POA: Diagnosis not present

## 2019-04-13 DIAGNOSIS — Z823 Family history of stroke: Secondary | ICD-10-CM | POA: Insufficient documentation

## 2019-04-13 LAB — CBC WITH DIFFERENTIAL/PLATELET
Abs Immature Granulocytes: 0.01 10*3/uL (ref 0.00–0.07)
Basophils Absolute: 0 10*3/uL (ref 0.0–0.1)
Basophils Relative: 0 %
Eosinophils Absolute: 0.1 10*3/uL (ref 0.0–0.5)
Eosinophils Relative: 1 %
HCT: 42 % (ref 39.0–52.0)
Hemoglobin: 13.2 g/dL (ref 13.0–17.0)
Immature Granulocytes: 0 %
Lymphocytes Relative: 19 %
Lymphs Abs: 1.3 10*3/uL (ref 0.7–4.0)
MCH: 31 pg (ref 26.0–34.0)
MCHC: 31.4 g/dL (ref 30.0–36.0)
MCV: 98.6 fL (ref 80.0–100.0)
Monocytes Absolute: 0.5 10*3/uL (ref 0.1–1.0)
Monocytes Relative: 8 %
Neutro Abs: 5.2 10*3/uL (ref 1.7–7.7)
Neutrophils Relative %: 72 %
Platelets: 256 10*3/uL (ref 150–400)
RBC: 4.26 MIL/uL (ref 4.22–5.81)
RDW: 14.2 % (ref 11.5–15.5)
WBC: 7.2 10*3/uL (ref 4.0–10.5)
nRBC: 0 % (ref 0.0–0.2)

## 2019-04-13 LAB — CMP (CANCER CENTER ONLY)
ALT: 16 U/L (ref 0–44)
AST: 18 U/L (ref 15–41)
Albumin: 3.8 g/dL (ref 3.5–5.0)
Alkaline Phosphatase: 142 U/L — ABNORMAL HIGH (ref 38–126)
Anion gap: 8 (ref 5–15)
BUN: 16 mg/dL (ref 8–23)
CO2: 32 mmol/L (ref 22–32)
Calcium: 9.2 mg/dL (ref 8.9–10.3)
Chloride: 103 mmol/L (ref 98–111)
Creatinine: 1.28 mg/dL — ABNORMAL HIGH (ref 0.61–1.24)
GFR, Est AFR Am: >60 mL/min (ref >60)
GFR, Estimated: 55 mL/min — ABNORMAL LOW (ref >60)
Glucose, Bld: 79 mg/dL (ref 70–99)
Potassium: 4.5 mmol/L (ref 3.5–5.1)
Sodium: 143 mmol/L (ref 135–145)
Total Bilirubin: 0.3 mg/dL (ref 0.3–1.2)
Total Protein: 7.9 g/dL (ref 6.5–8.1)

## 2019-04-13 LAB — LACTATE DEHYDROGENASE: LDH: 187 U/L (ref 98–192)

## 2019-04-14 ENCOUNTER — Telehealth: Payer: Self-pay | Admitting: Hematology

## 2019-04-14 NOTE — Telephone Encounter (Signed)
Scheduled appt per 6/1 los. ° °A calendar will be mailed out. °

## 2019-04-16 DIAGNOSIS — R69 Illness, unspecified: Secondary | ICD-10-CM | POA: Diagnosis not present

## 2019-04-24 ENCOUNTER — Ambulatory Visit (INDEPENDENT_AMBULATORY_CARE_PROVIDER_SITE_OTHER): Payer: Medicare HMO | Admitting: Family Medicine

## 2019-04-24 ENCOUNTER — Other Ambulatory Visit: Payer: Self-pay

## 2019-04-24 ENCOUNTER — Encounter: Payer: Self-pay | Admitting: Family Medicine

## 2019-04-24 VITALS — BP 135/78 | HR 94 | Temp 98.8°F | Resp 16 | Ht 64.0 in | Wt 211.8 lb

## 2019-04-24 DIAGNOSIS — M05742 Rheumatoid arthritis with rheumatoid factor of left hand without organ or systems involvement: Secondary | ICD-10-CM | POA: Diagnosis not present

## 2019-04-24 DIAGNOSIS — C8580 Other specified types of non-Hodgkin lymphoma, unspecified site: Secondary | ICD-10-CM

## 2019-04-24 DIAGNOSIS — M05741 Rheumatoid arthritis with rheumatoid factor of right hand without organ or systems involvement: Secondary | ICD-10-CM

## 2019-04-24 DIAGNOSIS — L918 Other hypertrophic disorders of the skin: Secondary | ICD-10-CM | POA: Diagnosis not present

## 2019-04-24 DIAGNOSIS — I10 Essential (primary) hypertension: Secondary | ICD-10-CM | POA: Diagnosis not present

## 2019-04-24 NOTE — Progress Notes (Signed)
Established Patient Office Visit  Subjective:  Patient ID: Bryan [name], male    DOB: February 20, 1946  Age: 73 y.o. MRN: [id number]  CC:  Chief Complaint  Patient presents with  . Arthritis    in both hands; worse in right hand  . mole    on upper part of back; wants it looked at    HPI Bryan [name] presents for  Rheumatoid Arthritis Patient is going to Orthopedics for his bilateral arthritis of the hand He was started on hydrocodone acetaminophen which helps reduce the pain but does not stop it He denies constipation He can grasp and hold things His pain is worse early in the morning but afte rmoving the pain eases up some His pain is 5/10  Hypertension: Patient here for follow-up of elevated blood pressure. He is not exercising and is adherent to low salt diet.  Blood pressure is well controlled at home. Cardiac symptoms none. Patient denies chest pain, chest pressure/discomfort, claudication, exertional chest pressure/discomfort, fatigue, irregular heart beat, lower extremity edema and orthopnea.  Cardiovascular risk factors: hypertension, male gender and sedentary lifestyle. Use of agents associated with hypertension: none. History of target organ damage: none. BP Readings from Last 3 Encounters:  04/24/19 135/78  04/13/19 (!) 185/80  12/09/18 125/82   MALT He has marginal zone lymphoma He is seeing Dr. Curt Bears He reports that he was told that his results look fine Denies fatigue, but has continued night sweats      Lab Results  Component Value Date   CREATININE 1.28 (H) 04/13/2019     Past Medical History:  Diagnosis Date  . Anemia   . Arthritis    hands, knees, cervical area. Back pain. Rheumatoid arthritis- weekly injections.  [name] BPH (benign prostatic hypertrophy)   . Cancer ([place])   . Essential tremor   . GERD (gastroesophageal reflux disease)    reports for indigestion he uses mustard   . Hearing deficit    wears hearing aids bilateral  . HTN  (hypertension)   . Obesity   . Refusal of blood transfusions as patient is Jehovah's Witness   . Right bundle branch block    history of  . Seizures ([facility])    AS A CHILD. only esential tremors now.  . Sleep apnea    cpap - settings at 9 per patient   . Ulcer     Past Surgical History:  Procedure Laterality Date  . ANTERIOR CERVICAL DECOMP/DISCECTOMY FUSION N/A 03/30/2013   Procedure: ANTERIOR CERVICAL DECOMPRESSION/DISCECTOMY FUSION 2 LEVELS;  Surgeon: Otilio Connors, MD;  Location: [facility];  Service: Neurosurgery;  Laterality: N/A;  C4-5 C5-6 Anterior cervical decompression/diskectomy/fusion/Allograft/Plate  . CHOLECYSTECTOMY N/A 12/07/2013   Procedure: LAPAROSCOPIC CHOLECYSTECTOMY WITH INTRAOPERATIVE CHOLANGIOGRAM;  Surgeon: Adin Hector, MD;  Location: [facility];  Service: General;  Laterality: N/A;  . COLONOSCOPY    . DECOMPRESSIVE LUMBAR LAMINECTOMY LEVEL 2 N/A 02/15/2015   Procedure: COMPLETE DECOMPRESSIVE LUMBAR LAMINECTOMY L4-L5/ FORAMINOTOMY TO L4 NERVE ROOT AND L5 NERVE ROOT BILATERALLY;  Surgeon: [name], MD;  Location: [facility];  Service: Orthopedics;  Laterality: N/A;  . EYE SURGERY     right, growth excision  . LUMBAR LAMINECTOMY/DECOMPRESSION MICRODISCECTOMY Left 01/18/2016   Procedure:  DECOMPRESSION L4-L5 MICRODISCECTOMY L4-L5 ON LEFT FOR SPINAL STENOSIS;  Surgeon: [name], MD;  Location: [facility];  Service: Orthopedics;  Laterality: Left;  . PAROTIDECTOMY Left 01/24/2018   Procedure: INCISIONAL BIOPSY OF LEFT PAROTID    MASS;  Surgeon: Helayne Seminole, MD;  Location: [facility];  Service: ENT;  Laterality: Left;  . SPINE SURGERY    . TONSILLECTOMY    . VASECTOMY    . WRIST GANGLION EXCISION Left     Family History  Problem Relation Age of Onset  . Colon cancer Maternal Grandfather   . Kidney disease Brother   . Hypertension Brother   . Diabetes Brother   . Stroke Maternal Grandmother   . Diabetes Mother   . Hypertension Mother   . CAD Father         died of MI at age 28  . Hypertension Father   . Diabetes Paternal Aunt        x 4 aunts    Social History   Socioeconomic History  . Marital status: Married    Spouse name: Not on file  . Number of children: 2  . Years of education: Not on file  . Highest education level: Not on file  Occupational History  . Occupation: bus Education administrator: RETIRED  Social Needs  . Financial resource strain: Not on file  . Food insecurity    Worry: Not on file    Inability: Not on file  . Transportation needs    Medical: Not on file    Non-medical: Not on file  Tobacco Use  . Smoking status: Former Smoker    Types: Cigarettes    Quit date: 12/14/1972    Years since quitting: 46.3  . Smokeless tobacco: Never Used  Substance and Sexual Activity  . Alcohol use: Yes    Comment: occasionally  . Drug use: No  . Sexual activity: Yes    Birth control/protection: None  Lifestyle  . Physical activity    Days per week: Not on file    Minutes per session: Not on file  . Stress: Not on file  Relationships  . Social Herbalist on phone: Not on file    Gets together: Not on file    Attends religious service: Not on file    Active member of club or organization: Not on file    Attends meetings of clubs or organizations: Not on file    Relationship status: Not on file  . Intimate partner violence    Fear of current or ex partner: Not on file    Emotionally abused: Not on file    Physically abused: Not on file    Forced sexual activity: Not on file  Other Topics Concern  . Not on file  Social History Narrative  . Not on file    Outpatient Medications Prior to Visit  Medication Sig Dispense Refill  . atorvastatin (LIPITOR) 10 MG tablet Take 10 mg by mouth daily.    . citalopram (CELEXA) 20 MG tablet Take 20 mg by mouth daily.    . hydrochlorothiazide (HYDRODIURIL) 25 MG tablet Take 1 tablet by mouth once daily 90 tablet 0  . HYDROcodone-acetaminophen (NORCO/VICODIN) 5-325 MG  tablet Take 1 tablet by mouth every 8 (eight) hours as needed for moderate pain.     . hydroxychloroquine (PLAQUENIL) 200 MG tablet Take 400 mg by mouth daily.    . meloxicam (MOBIC) 15 MG tablet Take 15 mg by mouth daily.  1  . methocarbamol (ROBAXIN) 750 MG tablet Take by mouth.    . metoprolol succinate (TOPROL-XL) 50 MG 24 hr tablet TAKE 1 TABLET BY MOUTH ONCE DAILY WITH  OR  IMMEDIATELY  FOLLOWING  A  MEAL 90 tablet 3  . metoprolol tartrate (LOPRESSOR) 50 MG tablet Take by mouth.    . predniSONE (DELTASONE) 5 MG tablet Take by mouth.    . primidone (MYSOLINE) 50 MG tablet Take 50-100 mg by mouth. 100 mg in the and 50 mg in the evening    . primidone (MYSOLINE) 50 MG tablet Take by mouth.    . Tamsulosin HCl (FLOMAX) 0.4 MG CAPS Take 0.4 mg by mouth at bedtime.     . valACYclovir (VALTREX) 500 MG tablet TAKE 1 CAPLET BY MOUTH TWICE DAILY FOR 5 7 DAYS AS NEEDED FOR FLARES    . aspirin EC 81 MG tablet Take 1 tablet (81 mg total) by mouth daily. 30 tablet 0  . citalopram (CELEXA) 20 MG tablet Take by mouth.    . gabapentin (NEURONTIN) 300 MG capsule Take 1 capsule (300 mg total) by mouth 3 (three) times daily. 270 capsule 1  . losartan-hydrochlorothiazide (HYZAAR) 50-12.5 MG tablet Take 1 tablet by mouth daily. 90 tablet 0  . omeprazole (PRILOSEC) 20 MG capsule Take 1 capsule (20 mg total) by mouth daily. 30 capsule 0   No facility-administered medications prior to visit.     Allergies  Allergen Reactions  . Lisinopril Cough and Other (See Comments)    Cough  . Other Other (See Comments)    BLOOD PRODUCT REFUSAL (patient is a Jehovah's Witness) BLOOD PRODUCT REFUSAL (patient is a Jehovah's Witness)    [name] Review of Systems  Constitutional: Negative for activity change, appetite change, chills and fever.  HENT: Negative for congestion, nosebleeds, trouble swallowing and voice change.   Respiratory: Negative for cough, shortness of breath and wheezing.    Gastrointestinal: Negative for diarrhea, nausea and vomiting.  Genitourinary: Negative for difficulty urinating, dysuria, flank pain and hematuria.  Musculoskeletal: Negative for back pain, joint swelling and neck pain.  Neurological: Negative for dizziness, speech difficulty, light-headedness and numbness.  See HPI. All other review of systems negative.  SKIN: reports a skin mole that his wife wants him to get evaluated for   Objective:    Physical Exam  BP 135/78 (BP Location: Right Arm, Patient Position: Sitting, Cuff Size: Large)   Pulse 94   Temp 98.8 F (37.1 C) (Oral)   Resp 16   Ht 5\' 4"  (1.626 m)   Wt 211 lb 12.8 oz (96.1 kg)   SpO2 96%   BMI 36.36 kg/m  Wt Readings from Last 3 Encounters:  04/24/19 211 lb 12.8 oz (96.1 kg)  04/13/19 211 lb 1.6 oz (95.8 kg)  12/09/18 210 lb 12.8 oz (95.6 kg)   Physical Exam  Constitutional: Oriented to person, place, and time. Appears well-developed and well-nourished.  HENT:  Head: Normocephalic and atraumatic.  Eyes: Conjunctivae and EOM are normal.  Cardiovascular: Normal rate, regular rhythm, normal heart sounds and intact distal pulses.  No murmur heard. Pulmonary/Chest: Effort normal and breath sounds normal. No stridor. No respiratory distress. Has no wheezes.  Neurological: Is alert and oriented to person, place, and time.  Skin: Skin is warm. Capillary refill takes less than 2 seconds. Skin tag on the left of the cervical spine along the line of the scapula Psychiatric: Has a normal mood and affect. Behavior is normal. Judgment and thought content normal.    Health Maintenance Due  Topic Date Due  . PNA vac Low Risk Adult (2 of 2 - PCV13) 02/12/2014    There are no preventive care reminders to  display for this patient.  Lab Results  Component Value Date   TSH 1.070 10/31/2018   Lab Results  Component Value Date   WBC 7.2 04/13/2019   HGB 13.2 04/13/2019   HCT 42.0 04/13/2019   MCV 98.6 04/13/2019   PLT 256  04/13/2019   Lab Results  Component Value Date   NA 143 04/13/2019   K 4.5 04/13/2019   CO2 32 04/13/2019   GLUCOSE 79 04/13/2019   BUN 16 04/13/2019   CREATININE 1.28 (H) 04/13/2019   BILITOT 0.3 04/13/2019   ALKPHOS 142 (H) 04/13/2019   AST 18 04/13/2019   ALT 16 04/13/2019   PROT 7.9 04/13/2019   ALBUMIN 3.8 04/13/2019   CALCIUM 9.2 04/13/2019   ANIONGAP 8 04/13/2019   Lab Results  Component Value Date   CHOL 168 02/01/2017   Lab Results  Component Value Date   HDL 69 02/01/2017   Lab Results  Component Value Date   LDLCALC 86 02/01/2017   Lab Results  Component Value Date   TRIG 65 02/01/2017   Lab Results  Component Value Date   CHOLHDL 2.4 02/01/2017   Lab Results  Component Value Date   HGBA1C 5.5 10/31/2018      Assessment & Plan:   Problem List Items Addressed This Visit      Cardiovascular and Mediastinum   Hypertension (Chronic) - Patient's blood pressure is at goal of 139/89 or less. Condition is stable. Continue current medications and treatment plan. I recommend that you exercise for 30-45 minutes 5 days a week. I also recommend a balanced diet with fruits and vegetables every day, lean meats, and little fried foods. The DASH diet (you can find this online) is a good example of this.      Musculoskeletal and Integument   Rheumatoid arthritis ([place])  -  Discussed that he can just call Dr. Amil Amen to follow up now that his insurance has changed      Other   Marginal zone lymphoma ([place]) - Primary (Chronic)   Discussed precautions due to pandemic Continue with oncology as instructed   Relevant Medications    Other Visit Diagnoses    Skin tag    -  Discussed that this was benign and would remove if it was bothersome     A total of 25 minutes were spent face-to-face with the patient during this encounter and over half of that time was spent on counseling and coordination of care.  No orders of the defined types were placed in this  encounter.   Follow-up: No follow-ups on file.    [name], MD

## 2019-04-24 NOTE — Patient Instructions (Addendum)
   Dr. Amil Amen Phone: [phone]    If you have lab work done today you will be contacted with your lab results within the next 2 weeks.  If you have not heard from [place] then please contact us. The fastest way to get your results is to register for My Chart.   IF you received an x-ray today, you will receive an invoice from [facility]. Please contact [facility] at [phone] with questions or concerns regarding your invoice.   IF you received labwork today, you will receive an invoice from [facility]. Please contact [facility] at [phone] with questions or concerns regarding your invoice.   Our billing staff will not be able to assist you with questions regarding bills from these companies.  You will be contacted with the lab results as soon as they are available. The fastest way to get your results is to activate your My Chart account. Instructions are located on the last page of this paperwork. If you have not heard from [place] regarding the results in 2 weeks, please contact this office.      Skin Tag, Adult  A skin tag (acrochordon) is a soft, extra growth of skin. Most skin tags are flesh-colored and rarely bigger than a pencil eraser. They commonly form near areas where there are folds in the skin, such as the armpit or groin. Skin tags are not dangerous, and they do not spread from person to person (are not contagious). You may have one skin tag or several. Skin tags do not require treatment. However, your health care provider may recommend removal of a skin tag if it:  Gets irritated from clothing.  Bleeds.  Is visible and unsightly. Your health care provider can remove skin tags with a simple surgical procedure or a procedure that involves freezing the skin tag. Follow these instructions at home:  Watch for any changes in your skin tag. A normal skin tag does not require any other special care at home.  Take over-the-counter and prescription medicines  only as told by your health care provider.  Keep all follow-up visits as told by your health care provider. This is important. Contact a health care provider if:  You have a skin tag that: ? Becomes painful. ? Changes color. ? Bleeds. ? Swells.  You develop more skin tags. This information is not intended to replace advice given to you by your health care provider. Make sure you discuss any questions you have with your health care provider. Document Released: 11/13/2015 Document Revised: 06/24/2016 Document Reviewed: 11/13/2015 Elsevier Interactive Patient Education  2019 [employer].

## 2019-05-01 DIAGNOSIS — E669 Obesity, unspecified: Secondary | ICD-10-CM | POA: Diagnosis not present

## 2019-05-01 DIAGNOSIS — M2550 Pain in unspecified joint: Secondary | ICD-10-CM | POA: Diagnosis not present

## 2019-05-01 DIAGNOSIS — G8929 Other chronic pain: Secondary | ICD-10-CM | POA: Diagnosis not present

## 2019-05-01 DIAGNOSIS — M0579 Rheumatoid arthritis with rheumatoid factor of multiple sites without organ or systems involvement: Secondary | ICD-10-CM | POA: Diagnosis not present

## 2019-05-01 DIAGNOSIS — Z6835 Body mass index (BMI) 35.0-35.9, adult: Secondary | ICD-10-CM | POA: Diagnosis not present

## 2019-05-01 DIAGNOSIS — C884 Extranodal marginal zone B-cell lymphoma of mucosa-associated lymphoid tissue [MALT-lymphoma]: Secondary | ICD-10-CM | POA: Diagnosis not present

## 2019-05-01 DIAGNOSIS — I7300 Raynaud's syndrome without gangrene: Secondary | ICD-10-CM | POA: Diagnosis not present

## 2019-05-01 DIAGNOSIS — M545 Low back pain: Secondary | ICD-10-CM | POA: Diagnosis not present

## 2019-06-17 DIAGNOSIS — M0579 Rheumatoid arthritis with rheumatoid factor of multiple sites without organ or systems involvement: Secondary | ICD-10-CM | POA: Diagnosis not present

## 2019-06-28 ENCOUNTER — Other Ambulatory Visit: Payer: Self-pay | Admitting: Family Medicine

## 2019-06-30 ENCOUNTER — Other Ambulatory Visit: Payer: Self-pay

## 2019-06-30 ENCOUNTER — Ambulatory Visit (INDEPENDENT_AMBULATORY_CARE_PROVIDER_SITE_OTHER): Payer: Medicare HMO | Admitting: Registered Nurse

## 2019-06-30 ENCOUNTER — Encounter: Payer: Self-pay | Admitting: Registered Nurse

## 2019-06-30 VITALS — BP 130/70 | HR 76 | Temp 98.7°F | Resp 16 | Wt 219.0 lb

## 2019-06-30 DIAGNOSIS — K643 Fourth degree hemorrhoids: Secondary | ICD-10-CM

## 2019-06-30 NOTE — Patient Instructions (Signed)
° ° ° °  If you have lab work done today you will be contacted with your lab results within the next 2 weeks.  If you have not heard from us then please contact us. The fastest way to get your results is to register for My Chart. ° ° °IF you received an x-ray today, you will receive an invoice from [facility]. Please contact [facility] at [phone] with questions or concerns regarding your invoice.  ° °IF you received labwork today, you will receive an invoice from [facility]. Please contact [facility] at [phone] with questions or concerns regarding your invoice.  ° °Our billing staff will not be able to assist you with questions regarding bills from these companies. ° °You will be contacted with the lab results as soon as they are available. The fastest way to get your results is to activate your My Chart account. Instructions are located on the last page of this paperwork. If you have not heard from us regarding the results in 2 weeks, please contact this office. °  ° ° ° °

## 2019-06-30 NOTE — Progress Notes (Signed)
Acute Office Visit  Subjective:    Patient ID: Bryan [name], male    DOB: Mar 13, 1946, 73 y.o.   MRN: [id number]  Chief Complaint  Patient presents with  . Recurrent Skin Infections    pt states he noticed a boil in the rectal area x 1 week     HPI Patient is in today for suspected cyst in rectal area  Pt states he noticed it around 1 week ago. It has not been painful. He had his wife look at it and she described it as a "knot". He denies discharge. He denies systemic symptoms of infection. Denies diarrhea, constipation, nausea, vomiting, blood in stool, blood when wiping. Endorses a history of hemorrhoids.    Past Medical History:  Diagnosis Date  . Anemia   . Arthritis    hands, knees, cervical area. Back pain. Rheumatoid arthritis- weekly injections.  [name] BPH (benign prostatic hypertrophy)   . Cancer ([place])   . Essential tremor   . GERD (gastroesophageal reflux disease)    reports for indigestion he uses mustard   . Hearing deficit    wears hearing aids bilateral  . HTN (hypertension)   . Obesity   . Refusal of blood transfusions as patient is Jehovah's Witness   . Right bundle branch block    history of  . Seizures (HCC)    AS A CHILD. only esential tremors now.  . Sleep apnea    cpap - settings at 9 per patient   . Ulcer     Past Surgical History:  Procedure Laterality Date  . ANTERIOR CERVICAL DECOMP/DISCECTOMY FUSION N/A 03/30/2013   Procedure: ANTERIOR CERVICAL DECOMPRESSION/DISCECTOMY FUSION 2 LEVELS;  Surgeon: Otilio Connors, MD;  Location: [facility];  Service: Neurosurgery;  Laterality: N/A;  C4-5 C5-6 Anterior cervical decompression/diskectomy/fusion/Allograft/Plate  . CHOLECYSTECTOMY N/A 12/07/2013   Procedure: LAPAROSCOPIC CHOLECYSTECTOMY WITH INTRAOPERATIVE CHOLANGIOGRAM;  Surgeon: Adin Hector, MD;  Location: [facility];  Service: General;  Laterality: N/A;  . COLONOSCOPY    . DECOMPRESSIVE LUMBAR LAMINECTOMY LEVEL 2 N/A 02/15/2015   Procedure: COMPLETE  DECOMPRESSIVE LUMBAR LAMINECTOMY L4-L5/ FORAMINOTOMY TO L4 NERVE ROOT AND L5 NERVE ROOT BILATERALLY;  Surgeon: [name], MD;  Location[facility];  Service: Orthopedics;  Laterality: N/A;  . EYE SURGERY     right, growth excision  . LUMBAR LAMINECTOMY/DECOMPRESSION MICRODISCECTOMY Left 01/18/2016   Procedure:  DECOMPRESSION L4-L5 MICRODISCECTOMY L4-L5 ON LEFT FOR SPINAL STENOSIS;  Surgeon: [name], MD;  [facility];  Service: Orthopedics;  Laterality: Left;  . PAROTIDECTOMY Left 01/24/2018   Procedure: INCISIONAL BIOPSY OF LEFT PAROTID    MASS;  Surgeon: Helayne Seminole, MD;  Location: [facility];  Service: ENT;  Laterality: Left;  . SPINE SURGERY    . TONSILLECTOMY    . VASECTOMY    . WRIST GANGLION EXCISION Left     Family History  Problem Relation Age of Onset  . Colon cancer Maternal Grandfather   . Kidney disease Brother   . Hypertension Brother   . Diabetes Brother   . Stroke Maternal Grandmother   . Diabetes Mother   . Hypertension Mother   . CAD Father        died of MI at age 51  . Hypertension Father   . Diabetes Paternal Aunt        x 4 aunts    Social History   Socioeconomic History  . Marital status: Married    Spouse name: Not on file  .  Number of children: 2  . Years of education: Not on file  . Highest education level: Not on file  Occupational History  . Occupation: bus Education administrator: RETIRED  Social Needs  . Financial resource strain: Not on file  . Food insecurity    Worry: Not on file    Inability: Not on file  . Transportation needs    Medical: Not on file    Non-medical: Not on file  Tobacco Use  . Smoking status: Former Smoker    Types: Cigarettes    Quit date: 12/14/1972    Years since quitting: 46.5  . Smokeless tobacco: Never Used  Substance and Sexual Activity  . Alcohol use: Yes    Comment: occasionally  . Drug use: No  . Sexual activity: Yes    Birth control/protection: None  Lifestyle  . Physical activity     Days per week: Not on file    Minutes per session: Not on file  . Stress: Not on file  Relationships  . Social Herbalist on phone: Not on file    Gets together: Not on file    Attends religious service: Not on file    Active member of club or organization: Not on file    Attends meetings of clubs or organizations: Not on file    Relationship status: Not on file  . Intimate partner violence    Fear of current or ex partner: Not on file    Emotionally abused: Not on file    Physically abused: Not on file    Forced sexual activity: Not on file  Other Topics Concern  . Not on file  Social History Narrative  . Not on file    Outpatient Medications Prior to Visit  Medication Sig Dispense Refill  . atorvastatin (LIPITOR) 10 MG tablet Take 10 mg by mouth daily.    . citalopram (CELEXA) 20 MG tablet Take 20 mg by mouth daily.    . hydrochlorothiazide (HYDRODIURIL) 25 MG tablet Take 1 tablet by mouth once daily 90 tablet 0  . HYDROcodone-acetaminophen (NORCO/VICODIN) 5-325 MG tablet Take 1 tablet by mouth every 8 (eight) hours as needed for moderate pain.     . hydroxychloroquine (PLAQUENIL) 200 MG tablet Take 400 mg by mouth daily.    . meloxicam (MOBIC) 15 MG tablet Take 15 mg by mouth daily.  1  . methocarbamol (ROBAXIN) 750 MG tablet Take by mouth.    . metoprolol succinate (TOPROL-XL) 50 MG 24 hr tablet TAKE 1 TABLET BY MOUTH ONCE DAILY WITH  OR  IMMEDIATELY  FOLLOWING  A  MEAL 90 tablet 3  . metoprolol tartrate (LOPRESSOR) 50 MG tablet Take by mouth.    . predniSONE (DELTASONE) 5 MG tablet Take by mouth.    . primidone (MYSOLINE) 50 MG tablet Take 50-100 mg by mouth. 100 mg in the and 50 mg in the evening    . primidone (MYSOLINE) 50 MG tablet Take by mouth.    . Tamsulosin HCl (FLOMAX) 0.4 MG CAPS Take 0.4 mg by mouth at bedtime.     . valACYclovir (VALTREX) 500 MG tablet TAKE 1 CAPLET BY MOUTH TWICE DAILY FOR 5 7 DAYS AS NEEDED FOR FLARES     No facility-administered  medications prior to visit.     Allergies  Allergen Reactions  . Lisinopril Cough and Other (See Comments)    Cough  . Other Other (See Comments)    BLOOD PRODUCT REFUSAL (  patient is a Sales promotion account executive Witness) BLOOD PRODUCT REFUSAL (patient is a Sales promotion account executive Witness)    Review of Systems  Constitutional: Negative.   HENT: Negative.   Eyes: Negative.   Respiratory: Negative.   Cardiovascular: Negative.   Gastrointestinal: Negative.   Genitourinary: Negative.   Musculoskeletal: Negative.   Skin: Negative.   Neurological: Negative.   Endo/Heme/Allergies: Negative.   Psychiatric/Behavioral: Negative.   All other systems reviewed and are negative.      Objective:    Physical Exam  Constitutional: He is oriented to person, place, and time. He appears well-developed and well-nourished.  Cardiovascular: Normal rate and regular rhythm.  Pulmonary/Chest: Effort normal. No respiratory distress.  Abdominal: Soft. Bowel sounds are normal.  Genitourinary:     Neurological: He is alert and oriented to person, place, and time.  Skin: Skin is warm and dry. No rash noted. No erythema. No pallor.  Psychiatric: He has a normal mood and affect. His behavior is normal. Judgment and thought content normal.  Nursing note and vitals reviewed.   BP 130/70   Pulse 76   Temp 98.7 F (37.1 C) (Oral)   Resp 16   Wt 219 lb (99.3 kg)   SpO2 95%   BMI 37.59 kg/m  Wt Readings from Last 3 Encounters:  06/30/19 219 lb (99.3 kg)  04/24/19 211 lb 12.8 oz (96.1 kg)  04/13/19 211 lb 1.6 oz (95.8 kg)    Health Maintenance Due  Topic Date Due  . PNA vac Low Risk Adult (2 of 2 - PCV13) 02/12/2014  . INFLUENZA VACCINE  06/13/2019    There are no preventive care reminders to display for this patient.   Lab Results  Component Value Date   TSH 1.070 10/31/2018   Lab Results  Component Value Date   WBC 7.2 04/13/2019   HGB 13.2 04/13/2019   HCT 42.0 04/13/2019   MCV 98.6 04/13/2019   PLT 256  04/13/2019   Lab Results  Component Value Date   NA 143 04/13/2019   K 4.5 04/13/2019   CO2 32 04/13/2019   GLUCOSE 79 04/13/2019   BUN 16 04/13/2019   CREATININE 1.28 (H) 04/13/2019   BILITOT 0.3 04/13/2019   ALKPHOS 142 (H) 04/13/2019   AST 18 04/13/2019   ALT 16 04/13/2019   PROT 7.9 04/13/2019   ALBUMIN 3.8 04/13/2019   CALCIUM 9.2 04/13/2019   ANIONGAP 8 04/13/2019   Lab Results  Component Value Date   CHOL 168 02/01/2017   Lab Results  Component Value Date   HDL 69 02/01/2017   Lab Results  Component Value Date   LDLCALC 86 02/01/2017   Lab Results  Component Value Date   TRIG 65 02/01/2017   Lab Results  Component Value Date   CHOLHDL 2.4 02/01/2017   Lab Results  Component Value Date   HGBA1C 5.5 10/31/2018       Assessment & Plan:   Problem List Items Addressed This Visit    None    Visit Diagnoses    Grade IV hemorrhoids    -  Primary   Relevant Orders   Ambulatory referral to Gastroenterology       No orders of the defined types were placed in this encounter.  PLAN  Large hemorrhoid noted on exam. Irreducible. Size and prolapsed nature suggest stage IV  Referral to gastroenterology for management  Discussed plan with patient who is in agreement  Patient encouraged to call clinic with any questions, comments, or concerns.  Maximiano Coss, NP

## 2019-07-01 DIAGNOSIS — M0579 Rheumatoid arthritis with rheumatoid factor of multiple sites without organ or systems involvement: Secondary | ICD-10-CM | POA: Diagnosis not present

## 2019-07-03 ENCOUNTER — Ambulatory Visit: Payer: Self-pay | Admitting: *Deleted

## 2019-07-03 ENCOUNTER — Other Ambulatory Visit: Payer: Self-pay

## 2019-07-03 ENCOUNTER — Encounter (HOSPITAL_COMMUNITY): Payer: Self-pay

## 2019-07-03 ENCOUNTER — Emergency Department (HOSPITAL_COMMUNITY)
Admission: EM | Admit: 2019-07-03 | Discharge: 2019-07-03 | Disposition: A | Discharge status: Home / Self Care | Payer: Medicare HMO | Attending: Emergency Medicine | Admitting: Emergency Medicine

## 2019-07-03 DIAGNOSIS — K625 Hemorrhage of anus and rectum: Secondary | ICD-10-CM | POA: Diagnosis present

## 2019-07-03 DIAGNOSIS — K644 Residual hemorrhoidal skin tags: Secondary | ICD-10-CM | POA: Insufficient documentation

## 2019-07-03 DIAGNOSIS — Z79899 Other long term (current) drug therapy: Secondary | ICD-10-CM | POA: Diagnosis not present

## 2019-07-03 DIAGNOSIS — Z859 Personal history of malignant neoplasm, unspecified: Secondary | ICD-10-CM | POA: Insufficient documentation

## 2019-07-03 DIAGNOSIS — I10 Essential (primary) hypertension: Secondary | ICD-10-CM | POA: Diagnosis not present

## 2019-07-03 DIAGNOSIS — K649 Unspecified hemorrhoids: Secondary | ICD-10-CM

## 2019-07-03 DIAGNOSIS — Z87891 Personal history of nicotine dependence: Secondary | ICD-10-CM | POA: Diagnosis not present

## 2019-07-03 NOTE — ED Provider Notes (Signed)
[facility] DEPT Provider Note   CSN: [id number] Arrival date & time: 07/03/19  1403     History   Chief Complaint Chief Complaint  Patient presents with  . Rectal Bleeding    HPI Bryan [name] is a 73 y.o. male.     73 year old male presents with bleeding from a hemorrhoid x2 days.  Denies any weakness when standing.  States that the blood has been dark in nature.  No associate abdominal discomfort.  No bloody stools.  Only notes mild discomfort.  Has been using direct pressure with temporary relief.  Denies any use of blood thinners.     Past Medical History:  Diagnosis Date  . Anemia   . Arthritis    hands, knees, cervical area. Back pain. Rheumatoid arthritis- weekly injections.  [name] BPH (benign prostatic hypertrophy)   . Cancer ([place])   . Essential tremor   . GERD (gastroesophageal reflux disease)    reports for indigestion he uses mustard   . Hearing deficit    wears hearing aids bilateral  . HTN (hypertension)   . Obesity   . Refusal of blood transfusions as patient is Jehovah's Witness   . Right bundle branch block    history of  . Seizures (HCC)    AS A CHILD. only esential tremors now.  . Sleep apnea    cpap - settings at 9 per patient   . Ulcer     Patient Active Problem List   Diagnosis Date Noted  . Chest pain, rule out acute myocardial infarction 07/30/2018  . Elevated troponin   . Marginal zone lymphoma ([place]) 02/02/2018  . Lymphoma ([place]) 01/24/2018  . Parotid mass 10/28/2017  . Arthralgia of right lower leg 10/10/2017  . Class 2 severe obesity due to excess calories with serious comorbidity and body mass index (BMI) of 39.0 to 39.9 in adult ([place]) 03/07/2017  . ACE-inhibitor cough 03/07/2017  . Lumbago 06/21/2016  . Spinal stenosis, lumbar region, with neurogenic claudication 02/15/2015  . Obesity (BMI 30-39.9) 12/07/2013  . Refusal of blood transfusions as patient is Jehovah's Witness 12/07/2013  . Rheumatoid  arthritis ([place]) 10/26/2012  . Nonspecific abnormal finding in stool contents 07/28/2012  . Multiple joint pain 06/05/2012  . History of tobacco use-  06/05/2012  . Asthmatic bronchitis 12/15/2011  . BPH (benign prostatic hyperplasia) 12/15/2011  . Essential tremor 12/15/2011  . Hypertension 12/15/2011    Past Surgical History:  Procedure Laterality Date  . ANTERIOR CERVICAL DECOMP/DISCECTOMY FUSION N/A 03/30/2013   Procedure: ANTERIOR CERVICAL DECOMPRESSION/DISCECTOMY FUSION 2 LEVELS;  Surgeon: Otilio Connors, MD;  Location: [facility];  Service: Neurosurgery;  Laterality: N/A;  C4-5 C5-6 Anterior cervical decompression/diskectomy/fusion/Allograft/Plate  . CHOLECYSTECTOMY N/A 12/07/2013   Procedure: LAPAROSCOPIC CHOLECYSTECTOMY WITH INTRAOPERATIVE CHOLANGIOGRAM;  Surgeon: Adin Hector, MD;  [facility];  Service: General;  Laterality: N/A;  . COLONOSCOPY    . DECOMPRESSIVE LUMBAR LAMINECTOMY LEVEL 2 N/A 02/15/2015   Procedure: COMPLETE DECOMPRESSIVE LUMBAR LAMINECTOMY L4-L5/ FORAMINOTOMY TO L4 NERVE ROOT AND L5 NERVE ROOT BILATERALLY;  Surgeon: [name], MD;  Location[facility];  Service: Orthopedics;  Laterality: N/A;  . EYE SURGERY     right, growth excision  . LUMBAR LAMINECTOMY/DECOMPRESSION MICRODISCECTOMY Left 01/18/2016   Procedure:  DECOMPRESSION L4-L5 MICRODISCECTOMY L4-L5 ON LEFT FOR SPINAL STENOSIS;  Surgeon: [name], MD;  [facility];  Service: Orthopedics;  Laterality: Left;  . PAROTIDECTOMY Left 01/24/2018   Procedure: INCISIONAL BIOPSY OF LEFT PAROTID  MASS;  Surgeon: Helayne Seminole, MD;  Location: [facility];  Service: ENT;  Laterality: Left;  . SPINE SURGERY    . TONSILLECTOMY    . VASECTOMY    . WRIST GANGLION EXCISION Left         Home Medications    Prior to Admission medications   Medication Sig Start Date End Date Taking? Authorizing Provider  atorvastatin (LIPITOR) 10 MG tablet Take 10 mg by mouth daily.    [provider]   citalopram (CELEXA) 20 MG tablet Take 20 mg by mouth daily. 10/10/18   [provider]  hydrochlorothiazide (HYDRODIURIL) 25 MG tablet Take 1 tablet by mouth once daily 06/29/19   [name], MD  HYDROcodone-acetaminophen (NORCO/VICODIN) 5-325 MG tablet Take 1 tablet by mouth every 8 (eight) hours as needed for moderate pain.     Bjorn Pippin, PA-C  hydroxychloroquine (PLAQUENIL) 200 MG tablet Take 400 mg by mouth daily. 04/03/19   [provider]  meloxicam (MOBIC) 15 MG tablet Take 15 mg by mouth daily. 07/11/18   [provider]  methocarbamol (ROBAXIN) 750 MG tablet Take by mouth.    [provider]  metoprolol succinate (TOPROL-XL) 50 MG 24 hr tablet TAKE 1 TABLET BY MOUTH ONCE DAILY WITH  OR  IMMEDIATELY  FOLLOWING  A  MEAL 10/31/18   Shawnee Knapp, MD  metoprolol tartrate (LOPRESSOR) 50 MG tablet Take by mouth.    [provider]  predniSONE (DELTASONE) 5 MG tablet Take by mouth. 04/03/19   [provider]  primidone (MYSOLINE) 50 MG tablet Take 50-100 mg by mouth. 100 mg in the and 50 mg in the evening    [provider]  primidone (MYSOLINE) 50 MG tablet Take by mouth.    [provider]  Tamsulosin HCl (FLOMAX) 0.4 MG CAPS Take 0.4 mg by mouth at bedtime.     [provider]  valACYclovir (VALTREX) 500 MG tablet TAKE 1 CAPLET BY MOUTH TWICE DAILY FOR 5 7 DAYS AS NEEDED FOR FLARES 10/21/18   [provider]    Family History Family History  Problem Relation Age of Onset  . Colon cancer Maternal Grandfather   . Kidney disease Brother   . Hypertension Brother   . Diabetes Brother   . Stroke Maternal Grandmother   . Diabetes Mother   . Hypertension Mother   . CAD Father        died of MI at age 33  . Hypertension Father   . Diabetes Paternal Aunt        x 4 aunts    Social History Social History   Tobacco Use  . Smoking status: Former Smoker    Types: Cigarettes    Quit date:  12/14/1972    Years since quitting: 46.5  . Smokeless tobacco: Never Used  Substance Use Topics  . Alcohol use: Yes    Comment: occasionally  . Drug use: No     Allergies   Lisinopril and Other   Review of Systems Review of Systems  All other systems reviewed and are negative.    Physical Exam Updated Vital Signs BP 115/68 (BP Location: Right Arm)   Pulse 79   Temp 98.8 F (37.1 C) (Oral)   Resp 18   Ht 1.664 m (5' 5.5")   Wt 98.9 kg   SpO2 100%   BMI 35.73 kg/m   Physical Exam Vitals signs and nursing note reviewed.  Constitutional:  General: He is not in acute distress.    Appearance: Normal appearance. He is well-developed. He is not toxic-appearing.  HENT:     Head: Normocephalic and atraumatic.  Eyes:     General: Lids are normal.     Conjunctiva/sclera: Conjunctivae normal.     Pupils: Pupils are equal, round, and reactive to light.  Neck:     Musculoskeletal: Normal range of motion and neck supple.     Thyroid: No thyroid mass.     Trachea: No tracheal deviation.  Cardiovascular:     Rate and Rhythm: Normal rate and regular rhythm.     Heart sounds: Normal heart sounds. No murmur. No gallop.   Pulmonary:     Effort: Pulmonary effort is normal. No respiratory distress.     Breath sounds: Normal breath sounds. No stridor. No decreased breath sounds, wheezing, rhonchi or rales.  Abdominal:     General: Bowel sounds are normal. There is no distension.     Palpations: Abdomen is soft.     Tenderness: There is no abdominal tenderness. There is no rebound.  Genitourinary:    Rectum: External hemorrhoid present.    Musculoskeletal: Normal range of motion.        General: No tenderness.  Skin:    General: Skin is warm and dry.     Findings: No abrasion or rash.  Neurological:     Mental Status: He is alert and oriented to person, place, and time.     GCS: GCS eye subscore is 4. GCS verbal subscore is 5. GCS motor subscore is 6.     Cranial Nerves:  No cranial nerve deficit.     Sensory: No sensory deficit.  Psychiatric:        Speech: Speech normal.        Behavior: Behavior normal.      ED Treatments / Results  Labs (all labs ordered are listed, but only abnormal results are displayed) Labs Reviewed - No data to display  EKG None  Radiology No results found.  Procedures Procedures (including critical care time)  Medications Ordered in ED Medications - No data to display   Initial Impression / Assessment and Plan / ED Course  I have reviewed the triage vital signs and the nursing notes.  Pertinent labs & imaging results that were available during my care of the patient were reviewed by me and considered in my medical decision making (see chart for details).        .  Hemostats used to excise clots in patient's hemorrhoid.  Patient tolerated procedure well.  Will prescribe sitz bath's and return precautions given  Final Clinical Impressions(s) / ED Diagnoses   Final diagnoses:  None    ED Discharge Orders    None       Lacretia Leigh, MD 07/03/19 1551

## 2019-07-03 NOTE — Telephone Encounter (Signed)
  Pt called in c/o bleeding from a hemorrhoid that burst last night.   It won't stop bleeding.   I'm using my wife's pads in my underwear and they are bloody. I saw a dr for this hemorrhoid  on Monday and I've been referred to another dr but they can't see me until Sept 24.  I have referred him to the ED.   His wife is going to drive him to [facility] ED now.   Reason for Disposition . [1] MODERATE rectal bleeding (small blood clots, passing blood without stool, or toilet water turns red) AND [2] more than once a day  Answer Assessment - Initial Assessment Questions 1. APPEARANCE of BLOOD: "What color is it?" "Is it passed separately, on the surface of the stool, or mixed in with the stool?"      I saw a dr on Monday for this hemmoroid.     They can't see me until Sept 24.    Last night it burst.    My wife put peroxide on it.     2. AMOUNT: "How much blood was passed?"      I put my wife's pads in my underwear and I'm bleeding.   I've changed 3N last night and 2 today so far.   3. FREQUENCY: "How many times has blood been passed with the stools?"      It's hard to tell because it's bleeding so much. 4. ONSET: "When was the blood first seen in the stools?" (Days or weeks)      Hard to tell 5. DIARRHEA: "Is there also some diarrhea?" If so, ask: "How many diarrhea stools were passed in past 24 hours?"      No 6. CONSTIPATION: "Do you have constipation?" If so, "How bad is it?"     No 7. RECURRENT SYMPTOMS: "Have you had blood in your stools before?" If so, ask: "When was the last time?" and "What happened that time?"      It's dark blood almost black. 8. BLOOD THINNERS: "Do you take any blood thinners?" (e.g., Coumadin/warfarin, Pradaxa/dabigatran, aspirin)     No    9. OTHER SYMPTOMS: "Do you have any other symptoms?"  (e.g., abdominal pain, vomiting, dizziness, fever)     No pain or dizziness 10. PREGNANCY: "Is there any chance you are pregnant?" "When was your last menstrual  period?"       N/A  Protocols used: RECTAL BLEEDING-A-AH

## 2019-07-03 NOTE — ED Triage Notes (Signed)
Patient states he had hemorrhoids that came out a week ago. Patient went to his PCP and then was referred to GI. Patient states the hemorrhoids burst 2 days ago and he has been having bleeding since. Patient denies any pain, but states it continues to bleed.

## 2019-07-05 ENCOUNTER — Other Ambulatory Visit: Payer: Self-pay | Admitting: Family Medicine

## 2019-07-05 NOTE — Telephone Encounter (Signed)
Requested Prescriptions  Pending Prescriptions Disp Refills  . hydrochlorothiazide (HYDRODIURIL) 25 MG tablet [Pharmacy Med Name: hydroCHLOROthiazide 25 MG Oral Tablet] 90 tablet 0    Sig: Take 1 tablet by mouth once daily     Cardiovascular: Diuretics - Thiazide Failed - 07/05/2019  8:20 AM      Failed - Cr in normal range and within 360 days    Creatinine  Date Value Ref Range Status  04/13/2019 1.28 (H) 0.61 - 1.24 mg/dL Final   Creat  Date Value Ref Range Status  03/31/2016 1.12 0.70 - 1.18 mg/dL Final         Passed - Ca in normal range and within 360 days    Calcium  Date Value Ref Range Status  04/13/2019 9.2 8.9 - 10.3 mg/dL Final         Passed - K in normal range and within 360 days    Potassium  Date Value Ref Range Status  04/13/2019 4.5 3.5 - 5.1 mmol/L Final         Passed - Na in normal range and within 360 days    Sodium  Date Value Ref Range Status  04/13/2019 143 135 - 145 mmol/L Final         Passed - Last BP in normal range    BP Readings from Last 1 Encounters:  07/03/19 126/74         Passed - Valid encounter within last 6 months    Recent Outpatient Visits          5 days ago Grade IV hemorrhoids   [facility] at [facility], [place], NP   2 months ago Marginal zone lymphoma [facility])   Primary Care at [facility], Arlie Solomons, MD   8 months ago Paresthesia of both feet   [facility] at [facility], Laurey Arrow, MD   1 year ago General unsteadiness   Primary Care at [place], PA-C   1 year ago Right upper quadrant abdominal pain   Primary Care at [facility], [place], [place]      Future Appointments            In 3 months [name], MD Primary Care at [place], [facility]

## 2019-08-03 DIAGNOSIS — C884 Extranodal marginal zone B-cell lymphoma of mucosa-associated lymphoid tissue [MALT-lymphoma]: Secondary | ICD-10-CM | POA: Diagnosis not present

## 2019-08-03 DIAGNOSIS — G8929 Other chronic pain: Secondary | ICD-10-CM | POA: Diagnosis not present

## 2019-08-03 DIAGNOSIS — I7300 Raynaud's syndrome without gangrene: Secondary | ICD-10-CM | POA: Diagnosis not present

## 2019-08-03 DIAGNOSIS — M545 Low back pain: Secondary | ICD-10-CM | POA: Diagnosis not present

## 2019-08-03 DIAGNOSIS — E669 Obesity, unspecified: Secondary | ICD-10-CM | POA: Diagnosis not present

## 2019-08-03 DIAGNOSIS — M0579 Rheumatoid arthritis with rheumatoid factor of multiple sites without organ or systems involvement: Secondary | ICD-10-CM | POA: Diagnosis not present

## 2019-08-03 DIAGNOSIS — M2550 Pain in unspecified joint: Secondary | ICD-10-CM | POA: Diagnosis not present

## 2019-08-03 DIAGNOSIS — Z6837 Body mass index (BMI) 37.0-37.9, adult: Secondary | ICD-10-CM | POA: Diagnosis not present

## 2019-08-06 ENCOUNTER — Ambulatory Visit: Payer: Medicare Other | Admitting: Gastroenterology

## 2019-08-13 DIAGNOSIS — M961 Postlaminectomy syndrome, not elsewhere classified: Secondary | ICD-10-CM | POA: Insufficient documentation

## 2019-08-13 DIAGNOSIS — M47816 Spondylosis without myelopathy or radiculopathy, lumbar region: Secondary | ICD-10-CM | POA: Insufficient documentation

## 2019-08-13 DIAGNOSIS — I10 Essential (primary) hypertension: Secondary | ICD-10-CM | POA: Diagnosis not present

## 2019-08-13 DIAGNOSIS — Z6836 Body mass index (BMI) 36.0-36.9, adult: Secondary | ICD-10-CM | POA: Diagnosis not present

## 2019-08-13 NOTE — Progress Notes (Signed)
HEMATOLOGY ONCOLOGY CLINIC NOTE  Date of Service: 08/13/19    Patient Care Team: [name], MD as PCP - General (Internal Medicine) Croitoru, Dani Gobble, MD as PCP - Cardiology (Cardiology) [name] as Physician Assistant (Emergency Medicine) [name], MD as Consulting Physician (Orthopedic Surgery) [name], MD as Consulting Physician (Oncology) Helayne Seminole, MD as Consulting Physician (Otolaryngology) Edrick Kins, DPM as Consulting Physician (Podiatry)  CHIEF COMPLAINTS F/u for  Marginal Zone lymphoma    HISTORY OF PRESENTING ILLNESS:  Bryan [name] 73 y.o. male is here because of a referral from ENT [name] regarding a concern of MALT lymphoma.   He is accompanied today by 4 members of his family. The pt reports that he is doing well overall. The pt's PCP is [name]. The pt sees [name] for his Rheumatology. The pt went off of humira a month before his mass appeared two months. The pt takes Methotrexate 15 mg once each week for his rheumatoid arthritis. He notes that he took humira for 8-10 months, and hasn't taken it in about 3 months. He began taking prednisone 15mg  q day, 2 months ago.   He reports having high blood pressure and takes amlodipine, metoprolol for it. He reports having had back and neck surgery for discs.  He also notes having essential tremors in his hands for which he takes primidone.  He reports having run out of flomax. He has had some PTSD from his time in the [place] war.  He notes he has had drenching night sweats for the last 15 years that are of intermittent frequency. He denies any other medical issues.    He first noticed swelling of his left cheek about two months ago. He notes that it was "hard like a rock." He was placed on prednisone for about a month after going to the doctor, with some relief. He then had his needle Bx recorded below.   He reports gum pain that began this  morning, and also reports that his tongue feels as if it has bumps on it. He reports taking folic acid 1mg  each day.    On 10/29/17 the pt had a neck CT revealing Multifocal left parotid lesion. Multiple ill-defined enhancing nodules in the left parotid gland. Asymmetric enhancing lymph nodes in the left neck are not pathologically enlarged however given the asymmetry and the left parotid lesion, they could represent neoplastic spread of parotid malignancy or lymphoma. Tissue sampling recommended.  Of note prior to the patient's visit, pt has had a biopsy of his left parotid gland completed on 12/03/17 with results revealing Atypical lymphoid proliferation. 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 -   B cell clonality study was positive for clonality.  On review of systems, pt reports gum pain, fatigue (not recent), occasional night sweats, ankle pain, and denies abdominal pains, back pain, flank pain, leg swelling, swollen or painful joints beside ankles.   On PMHx the pt has had rheumatoid arthritis for 6 years and HTN. He notes that he has anemia and is a Acupuncturist Witness I- which he notes means he would not want to consider blood products  On Surgical Hx the pt had back surgery. On Social Hx the pt quit smoking in 1976 after smoking about 8 cigarettes each day for 15 years. He notes drinking ETOH about once  a week. He denies chemical or radiation exposure, except for agent orange.  On Family Hx the pt notes high blood pressure, DM, but denies autoimmune conditions, cancers, or blood disorders.    INTERVAL HISTORY:   Bryan [name] is here for a scheduled follow-up of his marginal zone lymphoma. The patient's last visit with [place] was on 04/13/2019. The pt reports that he is doing well overall.  The pt reports no new concerns. RA controlled. Patient continues  to be Rituxan from his rheumatologist for mx of his RA.  On review of systems, pt reports no fevers no chills no night sweats no unexpected weight loss.   REVIEW OF SYSTEMS:   A 10+ POINT REVIEW OF SYSTEMS WAS OBTAINED including neurology, dermatology, psychiatry, cardiac, respiratory, lymph, extremities, GI, GU, Musculoskeletal, constitutional, breasts, reproductive, HEENT.  All pertinent positives are noted in the HPI.  All others are negative.    MEDICAL HISTORY:  Past Medical History:  Diagnosis Date  . Anemia   . Arthritis    hands, knees, cervical area. Back pain. Rheumatoid arthritis- weekly injections.  [name] BPH (benign prostatic hypertrophy)   . Cancer ([place])   . Essential tremor   . GERD (gastroesophageal reflux disease)    reports for indigestion he uses mustard   . Hearing deficit    wears hearing aids bilateral  . HTN (hypertension)   . Obesity   . Refusal of blood transfusions as patient is Jehovah's Witness   . Right bundle branch block    history of  . Seizures ([facility])    AS A CHILD. only esential tremors now.  . Sleep apnea    cpap - settings at 9 per patient   . Ulcer   JEHOVA's WITNESS  SURGICAL HISTORY: Past Surgical History:  Procedure Laterality Date  . ANTERIOR CERVICAL DECOMP/DISCECTOMY FUSION N/A 03/30/2013   Procedure: ANTERIOR CERVICAL DECOMPRESSION/DISCECTOMY FUSION 2 LEVELS;  Surgeon: Otilio Connors, MD;  Location: [facility];  Service: Neurosurgery;  Laterality: N/A;  C4-5 C5-6 Anterior cervical decompression/diskectomy/fusion/Allograft/Plate  . CHOLECYSTECTOMY N/A 12/07/2013   Procedure: LAPAROSCOPIC CHOLECYSTECTOMY WITH INTRAOPERATIVE CHOLANGIOGRAM;  Surgeon: Adin Hector, MD;  Location: [facility];  Service: General;  Laterality: N/A;  . COLONOSCOPY    . DECOMPRESSIVE LUMBAR LAMINECTOMY LEVEL 2 N/A 02/15/2015   Procedure: COMPLETE DECOMPRESSIVE LUMBAR LAMINECTOMY L4-L5/ FORAMINOTOMY TO L4 NERVE ROOT AND L5 NERVE ROOT BILATERALLY;  Surgeon: [name], MD;  Location: [facility];  Service: Orthopedics;  Laterality: N/A;  . EYE SURGERY     right, growth excision  . LUMBAR LAMINECTOMY/DECOMPRESSION MICRODISCECTOMY Left 01/18/2016   Procedure:  DECOMPRESSION L4-L5 MICRODISCECTOMY L4-L5 ON LEFT FOR SPINAL STENOSIS;  Surgeon: [name], MD;  Location: [facility];  Service: Orthopedics;  Laterality: Left;  . PAROTIDECTOMY Left 01/24/2018   Procedure: INCISIONAL BIOPSY OF LEFT PAROTID    MASS;  Surgeon: Helayne Seminole, MD;  Location: [facility];  Service: ENT;  Laterality: Left;  . SPINE SURGERY    . TONSILLECTOMY    . VASECTOMY    . WRIST GANGLION EXCISION Left     SOCIAL HISTORY: Social History   Socioeconomic History  . Marital status: Married    Spouse name: Not on file  . Number of children: 2  . Years of education: Not on file  . Highest education level: Not on file  Occupational History  . Occupation: bus Education administrator: RETIRED  Social Needs  . Financial resource strain: Not on file  . Food  insecurity    Worry: Not on file    Inability: Not on file  . Transportation needs    Medical: Not on file    Non-medical: Not on file  Tobacco Use  . Smoking status: Former Smoker    Types: Cigarettes    Quit date: 12/14/1972    Years since quitting: 46.6  . Smokeless tobacco: Never Used  Substance and Sexual Activity  . Alcohol use: Yes    Comment: occasionally  . Drug use: No  . Sexual activity: Yes    Birth control/protection: None  Lifestyle  . Physical activity    Days per week: Not on file    Minutes per session: Not on file  . Stress: Not on file  Relationships  . Social Herbalist on phone: Not on file    Gets together: Not on file    Attends religious service: Not on file    Active member of club or organization: Not on file    Attends meetings of clubs or organizations: Not on file    Relationship status: Not on file  . Intimate partner violence    Fear of current or ex partner: Not on file     Emotionally abused: Not on file    Physically abused: Not on file    Forced sexual activity: Not on file  Other Topics Concern  . Not on file  Social History Narrative  . Not on file    FAMILY HISTORY: Family History  Problem Relation Age of Onset  . Colon cancer Maternal Grandfather   . Kidney disease Brother   . Hypertension Brother   . Diabetes Brother   . Stroke Maternal Grandmother   . Diabetes Mother   . Hypertension Mother   . CAD Father        died of MI at age 50  . Hypertension Father   . Diabetes Paternal Aunt        x 4 aunts    ALLERGIES:  is allergic to lisinopril and other.  MEDICATIONS:  Current Outpatient Medications  Medication Sig Dispense Refill  . atorvastatin (LIPITOR) 10 MG tablet Take 10 mg by mouth daily.    . citalopram (CELEXA) 20 MG tablet Take 20 mg by mouth daily.    . hydrochlorothiazide (HYDRODIURIL) 25 MG tablet Take 1 tablet by mouth once daily 90 tablet 0  . HYDROcodone-acetaminophen (NORCO/VICODIN) 5-325 MG tablet Take 1 tablet by mouth every 8 (eight) hours as needed for moderate pain.     . hydroxychloroquine (PLAQUENIL) 200 MG tablet Take 400 mg by mouth daily.    . meloxicam (MOBIC) 15 MG tablet Take 15 mg by mouth daily.  1  . methocarbamol (ROBAXIN) 750 MG tablet Take by mouth.    . metoprolol succinate (TOPROL-XL) 50 MG 24 hr tablet TAKE 1 TABLET BY MOUTH ONCE DAILY WITH  OR  IMMEDIATELY  FOLLOWING  A  MEAL 90 tablet 3  . metoprolol tartrate (LOPRESSOR) 50 MG tablet Take by mouth.    . predniSONE (DELTASONE) 5 MG tablet Take by mouth.    . primidone (MYSOLINE) 50 MG tablet Take 50-100 mg by mouth. 100 mg in the and 50 mg in the evening    . primidone (MYSOLINE) 50 MG tablet Take by mouth.    . Tamsulosin HCl (FLOMAX) 0.4 MG CAPS Take 0.4 mg by mouth at bedtime.     . valACYclovir (VALTREX) 500 MG tablet TAKE 1 CAPLET BY MOUTH TWICE DAILY  FOR 5 7 DAYS AS NEEDED FOR FLARES     No current facility-administered medications for this  visit.     PHYSICAL EXAMINATION: ECOG FS:1 - Symptomatic but completely ambulatory  [name] Wt Readings from Last 3 Encounters:  07/03/19 218 lb (98.9 kg)  06/30/19 219 lb (99.3 kg)  04/24/19 211 lb 12.8 oz (96.1 kg)   There is no height or weight on file to calculate BMI.    GENERAL:alert, in no acute distress and comfortable SKIN: no acute rashes, no significant lesions EYES: conjunctiva are pink and non-injected, sclera anicteric OROPHARYNX: MMM, no exudates, no oropharyngeal erythema or ulceration NECK: supple, no JVD LYMPH:  no palpable lymphadenopathy in the cervical, axillary or inguinal regions LUNGS: clear to auscultation b/l with normal respiratory effort HEART: regular rate & rhythm ABDOMEN:  normoactive bowel sounds , non tender, not distended. Extremity: no pedal edema PSYCH: alert & oriented x 3 with fluent speech NEURO: no focal motor/sensory deficits    LABS CBC Latest Ref Rng & Units 08/14/2019 04/13/2019 12/09/2018  WBC 4.0 - 10.5 K/uL 5.1 7.2 5.8  Hemoglobin 13.0 - 17.0 g/dL 12.8(L) 13.2 13.3  Hematocrit 39.0 - 52.0 % 40.0 42.0 40.9  Platelets 150 - 400 K/uL 197 256 205   CBC    Component Value Date/Time   WBC 5.1 08/14/2019 1054   RBC 4.09 (L) 08/14/2019 1054   HGB 12.8 (L) 08/14/2019 1054   HGB 13.7 03/18/2018 0848   HCT 40.0 08/14/2019 1054   HCT 42.7 12/19/2017 1416   PLT 197 08/14/2019 1054   PLT 195 03/18/2018 0848   MCV 97.8 08/14/2019 1054   MCV 94.0 05/09/2018 1805   MCH 31.3 08/14/2019 1054   MCHC 32.0 08/14/2019 1054   RDW 13.2 08/14/2019 1054   LYMPHSABS 1.2 08/14/2019 1054   MONOABS 0.6 08/14/2019 1054   EOSABS 0.1 08/14/2019 1054   BASOSABS 0.0 08/14/2019 1054    CMP Latest Ref Rng & Units 08/14/2019 04/13/2019 12/09/2018  Glucose 70 - 99 mg/dL 81 79 113(H)  BUN 8 - 23 mg/dL 23 16 18   Creatinine 0.61 - 1.24 mg/dL 1.36(H) 1.28(H) 1.15  Sodium 135 - 145 mmol/L 141 143 140  Potassium 3.5 - 5.1  mmol/L 3.8 4.5 3.6  Chloride 98 - 111 mmol/L 103 103 101  CO2 22 - 32 mmol/L 30 32 29  Calcium 8.9 - 10.3 mg/dL 8.8(L) 9.2 8.9  Total Protein 6.5 - 8.1 g/dL 7.2 7.9 7.6  Total Bilirubin 0.3 - 1.2 mg/dL 0.3 0.3 0.2(L)  Alkaline Phos 38 - 126 U/L 156(H) 142(H) 178(H)  AST 15 - 41 U/L 20 18 19   ALT 0 - 44 U/L 15 16 16     Lab Results  Component Value Date   LDH 223 (H) 08/14/2019    LABORATORY DATA:       01/27/18 Flow Cytometry:   01/27/18 Parotid Gland Surgical Pathology   RADIOGRAPHIC STUDIES: I have personally reviewed the radiological images as listed and agreed with the findings in the report. No results found.  ASSESSMENT & PLAN:  Bryan [name] is a 73 y.o. male with    1.Recently diagnosed Stage IIE Marginal-zone lymphoma involving the left parotid gland, left cervical LN and mediastinal lymph nodes. . Lab Results  Component Value Date   LDH 187 04/13/2019   Hep C neg and Hep B -He completed 4 dose Rituxan 02/07/18-02/28/18.  LDH has normalized and the Parotid mass is no longer  palpable.  04/22/18 PET/CT revealed complete metabolic response to therapy. No residual hypermetabolic mass or lymphadenopathy.   2. Jehovas Witness- declines any use of blood products.  3. Rheumatoid Arthritis  Rituxan seems to have helped his RA as well. - off  MTX and prednisone and is currently on plaquenil per his rheumatologist, Dr. Amil Amen and PA Leafy Kindle.  -I encouraged him to be more active with at least walking and possibly start PT or Silver Sneakers program.  4. Low B12 levels -continue B12 SL 1018mcg SL daily  5. Wheezing - resolved currently PFTs 02/26/2018 - Pulmonary Function Diagnosis: Minimal Obstructive Airways Disease Minimal Restriction - Moderate Diffusion Defect   PLAN: -Discussed pt labwork today, 08/14/2019;  -no lab or clinical evidence of lymphoma progression at this time. -continue f/u with rheumatology for continued Rituxan for RA which will  likely also benefit lymphoma control.   FOLLOW UP: RTC with Dr Irene Limbo with labs in 6 months  The total time spent in the appt was 15 minutes and more than 50% was on counseling and direct patient cares.  All of the patient's questions were answered with apparent satisfaction. The patient knows to call the clinic with any problems, questions or concerns.    Sullivan Lone MD MS AAHIVMS [facility] Hematology/Oncology Physician [facility]  (Office):       [phone] (Work cell):  [phone] (Fax):           [phone]  I, Jacqualyn Posey, am acting as a scribe for Dr. Sullivan Lone.   .I have reviewed the above documentation for accuracy and completeness, and I agree with the above. [name] MD

## 2019-08-14 ENCOUNTER — Inpatient Hospital Stay: Payer: Medicare HMO | Attending: Hematology

## 2019-08-14 ENCOUNTER — Other Ambulatory Visit: Payer: Self-pay

## 2019-08-14 ENCOUNTER — Inpatient Hospital Stay: Payer: Medicare HMO | Admitting: Hematology

## 2019-08-14 VITALS — BP 132/69 | HR 64 | Temp 98.9°F | Resp 17 | Ht 65.5 in | Wt 220.2 lb

## 2019-08-14 DIAGNOSIS — M069 Rheumatoid arthritis, unspecified: Secondary | ICD-10-CM | POA: Diagnosis not present

## 2019-08-14 DIAGNOSIS — C8580 Other specified types of non-Hodgkin lymphoma, unspecified site: Secondary | ICD-10-CM | POA: Insufficient documentation

## 2019-08-14 DIAGNOSIS — E785 Hyperlipidemia, unspecified: Secondary | ICD-10-CM | POA: Diagnosis not present

## 2019-08-14 DIAGNOSIS — Z79899 Other long term (current) drug therapy: Secondary | ICD-10-CM | POA: Insufficient documentation

## 2019-08-14 DIAGNOSIS — I10 Essential (primary) hypertension: Secondary | ICD-10-CM | POA: Diagnosis not present

## 2019-08-14 LAB — CMP (CANCER CENTER ONLY)
ALT: 15 U/L (ref 0–44)
AST: 20 U/L (ref 15–41)
Albumin: 3.7 g/dL (ref 3.5–5.0)
Alkaline Phosphatase: 156 U/L — ABNORMAL HIGH (ref 38–126)
Anion gap: 8 (ref 5–15)
BUN: 23 mg/dL (ref 8–23)
CO2: 30 mmol/L (ref 22–32)
Calcium: 8.8 mg/dL — ABNORMAL LOW (ref 8.9–10.3)
Chloride: 103 mmol/L (ref 98–111)
Creatinine: 1.36 mg/dL — ABNORMAL HIGH (ref 0.61–1.24)
GFR, Est AFR Am: 59 mL/min — ABNORMAL LOW (ref >60)
GFR, Estimated: 51 mL/min — ABNORMAL LOW (ref >60)
Glucose, Bld: 81 mg/dL (ref 70–99)
Potassium: 3.8 mmol/L (ref 3.5–5.1)
Sodium: 141 mmol/L (ref 135–145)
Total Bilirubin: 0.3 mg/dL (ref 0.3–1.2)
Total Protein: 7.2 g/dL (ref 6.5–8.1)

## 2019-08-14 LAB — CBC WITH DIFFERENTIAL/PLATELET
Abs Immature Granulocytes: 0 10*3/uL (ref 0.00–0.07)
Basophils Absolute: 0 10*3/uL (ref 0.0–0.1)
Basophils Relative: 0 %
Eosinophils Absolute: 0.1 10*3/uL (ref 0.0–0.5)
Eosinophils Relative: 3 %
HCT: 40 % (ref 39.0–52.0)
Hemoglobin: 12.8 g/dL — ABNORMAL LOW (ref 13.0–17.0)
Immature Granulocytes: 0 %
Lymphocytes Relative: 24 %
Lymphs Abs: 1.2 10*3/uL (ref 0.7–4.0)
MCH: 31.3 pg (ref 26.0–34.0)
MCHC: 32 g/dL (ref 30.0–36.0)
MCV: 97.8 fL (ref 80.0–100.0)
Monocytes Absolute: 0.6 10*3/uL (ref 0.1–1.0)
Monocytes Relative: 11 %
Neutro Abs: 3.1 10*3/uL (ref 1.7–7.7)
Neutrophils Relative %: 62 %
Platelets: 197 10*3/uL (ref 150–400)
RBC: 4.09 MIL/uL — ABNORMAL LOW (ref 4.22–5.81)
RDW: 13.2 % (ref 11.5–15.5)
WBC: 5.1 10*3/uL (ref 4.0–10.5)
nRBC: 0 % (ref 0.0–0.2)

## 2019-08-14 LAB — LACTATE DEHYDROGENASE: LDH: 223 U/L — ABNORMAL HIGH (ref 98–192)

## 2019-08-17 ENCOUNTER — Telehealth: Payer: Self-pay | Admitting: Hematology

## 2019-08-17 NOTE — Telephone Encounter (Signed)
Scheduled appt per 10/2 los.  Spoke with patient spouse and she is aware of the appt date and time.

## 2019-08-21 DIAGNOSIS — M48062 Spinal stenosis, lumbar region with neurogenic claudication: Secondary | ICD-10-CM | POA: Diagnosis not present

## 2019-08-21 DIAGNOSIS — M5416 Radiculopathy, lumbar region: Secondary | ICD-10-CM | POA: Diagnosis not present

## 2019-08-21 DIAGNOSIS — Z6837 Body mass index (BMI) 37.0-37.9, adult: Secondary | ICD-10-CM | POA: Diagnosis not present

## 2019-08-21 DIAGNOSIS — I10 Essential (primary) hypertension: Secondary | ICD-10-CM | POA: Diagnosis not present

## 2019-08-31 DIAGNOSIS — G250 Essential tremor: Secondary | ICD-10-CM | POA: Diagnosis not present

## 2019-08-31 DIAGNOSIS — M069 Rheumatoid arthritis, unspecified: Secondary | ICD-10-CM | POA: Diagnosis not present

## 2019-08-31 DIAGNOSIS — G4730 Sleep apnea, unspecified: Secondary | ICD-10-CM | POA: Diagnosis not present

## 2019-08-31 DIAGNOSIS — H269 Unspecified cataract: Secondary | ICD-10-CM | POA: Diagnosis not present

## 2019-08-31 DIAGNOSIS — C8590 Non-Hodgkin lymphoma, unspecified, unspecified site: Secondary | ICD-10-CM | POA: Diagnosis not present

## 2019-08-31 DIAGNOSIS — G8929 Other chronic pain: Secondary | ICD-10-CM | POA: Diagnosis not present

## 2019-08-31 DIAGNOSIS — I10 Essential (primary) hypertension: Secondary | ICD-10-CM | POA: Diagnosis not present

## 2019-08-31 DIAGNOSIS — Z008 Encounter for other general examination: Secondary | ICD-10-CM | POA: Diagnosis not present

## 2019-08-31 DIAGNOSIS — E785 Hyperlipidemia, unspecified: Secondary | ICD-10-CM | POA: Diagnosis not present

## 2019-08-31 DIAGNOSIS — R69 Illness, unspecified: Secondary | ICD-10-CM | POA: Diagnosis not present

## 2019-09-14 DIAGNOSIS — M48062 Spinal stenosis, lumbar region with neurogenic claudication: Secondary | ICD-10-CM | POA: Diagnosis not present

## 2019-09-28 DIAGNOSIS — R69 Illness, unspecified: Secondary | ICD-10-CM | POA: Diagnosis not present

## 2019-10-06 DIAGNOSIS — M48062 Spinal stenosis, lumbar region with neurogenic claudication: Secondary | ICD-10-CM | POA: Diagnosis not present

## 2019-10-07 DIAGNOSIS — M0579 Rheumatoid arthritis with rheumatoid factor of multiple sites without organ or systems involvement: Secondary | ICD-10-CM | POA: Diagnosis not present

## 2019-10-07 DIAGNOSIS — Z79899 Other long term (current) drug therapy: Secondary | ICD-10-CM | POA: Diagnosis not present

## 2019-10-19 ENCOUNTER — Telehealth: Payer: Self-pay

## 2019-10-19 ENCOUNTER — Other Ambulatory Visit: Payer: Self-pay | Admitting: Neurosurgery

## 2019-10-19 DIAGNOSIS — M48062 Spinal stenosis, lumbar region with neurogenic claudication: Secondary | ICD-10-CM

## 2019-10-19 NOTE — Telephone Encounter (Signed)
Spoke with patient to review his medications before getting him scheduled for a lumbar myelogram.  He was informed he will need to stop Celexa/Citalopram for 48 hours before, and 24 hours after, the myelogram.  He stated he's actually out of this medication and that he just won't get it refilled until after the procedure.  He also was informed he will be here two hours, will need a driver and will need to be on strict bedrest for 24 hours after the procedure, all of which was explained to him in detail.

## 2019-10-21 DIAGNOSIS — M0579 Rheumatoid arthritis with rheumatoid factor of multiple sites without organ or systems involvement: Secondary | ICD-10-CM | POA: Diagnosis not present

## 2019-10-23 ENCOUNTER — Other Ambulatory Visit: Payer: Self-pay

## 2019-10-23 ENCOUNTER — Ambulatory Visit
Admission: RE | Admit: 2019-10-23 | Discharge: 2019-10-23 | Disposition: A | Discharge status: Home / Self Care | Payer: Medicare HMO | Source: Ambulatory Visit | Attending: Neurosurgery | Admitting: Neurosurgery

## 2019-10-23 DIAGNOSIS — M4316 Spondylolisthesis, lumbar region: Secondary | ICD-10-CM | POA: Diagnosis not present

## 2019-10-23 DIAGNOSIS — M48061 Spinal stenosis, lumbar region without neurogenic claudication: Secondary | ICD-10-CM | POA: Diagnosis not present

## 2019-10-23 DIAGNOSIS — M5127 Other intervertebral disc displacement, lumbosacral region: Secondary | ICD-10-CM | POA: Diagnosis not present

## 2019-10-23 DIAGNOSIS — M48062 Spinal stenosis, lumbar region with neurogenic claudication: Secondary | ICD-10-CM

## 2019-10-23 DIAGNOSIS — M5126 Other intervertebral disc displacement, lumbar region: Secondary | ICD-10-CM | POA: Diagnosis not present

## 2019-10-23 MED ORDER — IOPAMIDOL (ISOVUE-M 200) INJECTION 41%
18.0000 mL | Freq: Once | INTRAMUSCULAR | Status: AC
  Administered 2019-10-23: 18 mL via INTRATHECAL

## 2019-10-23 MED ORDER — DIAZEPAM 5 MG PO TABS
5.0000 mg | ORAL_TABLET | Freq: Once | ORAL | Status: AC
  Administered 2019-10-23: 5 mg via ORAL

## 2019-10-23 NOTE — Discharge Instructions (Signed)

## 2019-10-26 ENCOUNTER — Other Ambulatory Visit: Payer: Self-pay

## 2019-10-26 ENCOUNTER — Encounter: Payer: Self-pay | Admitting: Family Medicine

## 2019-10-26 ENCOUNTER — Ambulatory Visit (INDEPENDENT_AMBULATORY_CARE_PROVIDER_SITE_OTHER): Payer: Medicare HMO | Admitting: Family Medicine

## 2019-10-26 VITALS — BP 120/68 | HR 88 | Temp 97.3°F | Resp 17 | Ht 65.5 in | Wt 223.8 lb

## 2019-10-26 DIAGNOSIS — Z23 Encounter for immunization: Secondary | ICD-10-CM

## 2019-10-26 DIAGNOSIS — C8580 Other specified types of non-Hodgkin lymphoma, unspecified site: Secondary | ICD-10-CM | POA: Diagnosis not present

## 2019-10-26 DIAGNOSIS — I499 Cardiac arrhythmia, unspecified: Secondary | ICD-10-CM

## 2019-10-26 DIAGNOSIS — I10 Essential (primary) hypertension: Secondary | ICD-10-CM

## 2019-10-26 NOTE — Patient Instructions (Signed)
° ° ° °  If you have lab work done today you will be contacted with your lab results within the next 2 weeks.  If you have not heard from us then please contact us. The fastest way to get your results is to register for My Chart. ° ° °IF you received an x-ray today, you will receive an invoice from [facility]. Please contact [facility] at [phone] with questions or concerns regarding your invoice.  ° °IF you received labwork today, you will receive an invoice from [facility]. Please contact [facility] at [phone] with questions or concerns regarding your invoice.  ° °Our billing staff will not be able to assist you with questions regarding bills from these companies. ° °You will be contacted with the lab results as soon as they are available. The fastest way to get your results is to activate your My Chart account. Instructions are located on the last page of this paperwork. If you have not heard from us regarding the results in 2 weeks, please contact this office. °  ° ° ° °

## 2019-10-26 NOTE — Progress Notes (Signed)
Established Patient Office Visit  Subjective:  Patient ID: Bryan [name], male    DOB: 12/30/1945  Age: 73 y.o. MRN: [id number]  CC:  Chief Complaint  Patient presents with  . Hypertension    6 month f/u  . Medication Refill    meloxicam    HPI Bryan [name] presents for hypertension follow up  He has a history of Marginal Zone Lymphoma as well as Rheumatoid Arthritis He reports that his pain is helped by the medication for Rheumatoid Arthritis. He states that he has DJD of the spine and will be seeing a Spine Specialist. He states that his pain is better in some joints but not in his back.   Hypertension: Patient here for follow-up of elevated blood pressure. He is exercising and is adherent to low salt diet.  Blood pressure is well controlled at home. Cardiac symptoms chronic irregular heart beat.  Patient denies chest pain, chest pressure/discomfort, claudication, dyspnea, exertional chest pressure/discomfort, fatigue and lower extremity edema.  Cardiovascular risk factors: hypertension. Use of agents associated with hypertension: none. History of target organ damage: none. BP Readings from Last 3 Encounters:  10/26/19 120/68  10/23/19 (!) 166/78  08/14/19 132/69   Lab Results  Component Value Date   CREATININE 1.36 (H) 08/14/2019     Past Medical History:  Diagnosis Date  . Anemia   . Arthritis    hands, knees, cervical area. Back pain. Rheumatoid arthritis- weekly injections.  [name] BPH (benign prostatic hypertrophy)   . Cancer ([place])   . Essential tremor   . GERD (gastroesophageal reflux disease)    reports for indigestion he uses mustard   . Hearing deficit    wears hearing aids bilateral  . HTN (hypertension)   . Obesity   . Refusal of blood transfusions as patient is Jehovah's Witness   . Right bundle branch block    history of  . Seizures (HCC)    AS A CHILD. only esential tremors now.  . Sleep apnea    cpap - settings at 9 per patient   . Ulcer      Past Surgical History:  Procedure Laterality Date  . ANTERIOR CERVICAL DECOMP/DISCECTOMY FUSION N/A 03/30/2013   Procedure: ANTERIOR CERVICAL DECOMPRESSION/DISCECTOMY FUSION 2 LEVELS;  Surgeon: Otilio Connors, MD;  Location: [facility];  Service: Neurosurgery;  Laterality: N/A;  C4-5 C5-6 Anterior cervical decompression/diskectomy/fusion/Allograft/Plate  . CHOLECYSTECTOMY N/A 12/07/2013   Procedure: LAPAROSCOPIC CHOLECYSTECTOMY WITH INTRAOPERATIVE CHOLANGIOGRAM;  Surgeon: Adin Hector, MD;  Location: [facility];  Service: General;  Laterality: N/A;  . COLONOSCOPY    . DECOMPRESSIVE LUMBAR LAMINECTOMY LEVEL 2 N/A 02/15/2015   Procedure: COMPLETE DECOMPRESSIVE LUMBAR LAMINECTOMY L4-L5/ FORAMINOTOMY TO L4 NERVE ROOT AND L5 NERVE ROOT BILATERALLY;  Surgeon: [name], MD;  Location: [facility];  Service: Orthopedics;  Laterality: N/A;  . EYE SURGERY     right, growth excision  . LUMBAR LAMINECTOMY/DECOMPRESSION MICRODISCECTOMY Left 01/18/2016   Procedure:  DECOMPRESSION L4-L5 MICRODISCECTOMY L4-L5 ON LEFT FOR SPINAL STENOSIS;  Surgeon: [name], MD;  Location: [facility];  Service: Orthopedics;  Laterality: Left;  . PAROTIDECTOMY Left 01/24/2018   Procedure: INCISIONAL BIOPSY OF LEFT PAROTID    MASS;  Surgeon: Helayne Seminole, MD;  Location: [facility];  Service: ENT;  Laterality: Left;  . SPINE SURGERY    . TONSILLECTOMY    . VASECTOMY    . WRIST GANGLION EXCISION Left     Family History  Problem Relation Age of Onset  .  Colon cancer Maternal Grandfather   . Kidney disease Brother   . Hypertension Brother   . Diabetes Brother   . Stroke Maternal Grandmother   . Diabetes Mother   . Hypertension Mother   . CAD Father        died of MI at age 16  . Hypertension Father   . Diabetes Paternal Aunt        x 4 aunts    Social History   Socioeconomic History  . Marital status: Married    Spouse name: Not on file  . Number of children: 2  . Years of education: Not on file  . Highest  education level: Not on file  Occupational History  . Occupation: bus Education administrator: RETIRED  Tobacco Use  . Smoking status: Former Smoker    Types: Cigarettes    Quit date: 12/14/1972    Years since quitting: 46.9  . Smokeless tobacco: Never Used  Substance and Sexual Activity  . Alcohol use: Yes    Comment: occasionally  . Drug use: No  . Sexual activity: Yes    Birth control/protection: None  Other Topics Concern  . Not on file  Social History Narrative  . Not on file   Social Determinants of Health   Financial Resource Strain:   . Difficulty of Paying Living Expenses: Not on file  Food Insecurity:   . Worried About Charity fundraiser in the Last Year: Not on file  . Ran Out of Food in the Last Year: Not on file  Transportation Needs:   . Lack of Transportation (Medical): Not on file  . Lack of Transportation (Non-Medical): Not on file  Physical Activity:   . Days of Exercise per Week: Not on file  . Minutes of Exercise per Session: Not on file  Stress:   . Feeling of Stress : Not on file  Social Connections:   . Frequency of Communication with Friends and Family: Not on file  . Frequency of Social Gatherings with Friends and Family: Not on file  . Attends Religious Services: Not on file  . Active Member of Clubs or Organizations: Not on file  . Attends Archivist Meetings: Not on file  . Marital Status: Not on file  Intimate Partner Violence:   . Fear of Current or Ex-Partner: Not on file  . Emotionally Abused: Not on file  . Physically Abused: Not on file  . Sexually Abused: Not on file    Outpatient Medications Prior to Visit  Medication Sig Dispense Refill  . atorvastatin (LIPITOR) 10 MG tablet Take 10 mg by mouth daily.    . citalopram (CELEXA) 20 MG tablet Take 20 mg by mouth daily.    . hydrochlorothiazide (HYDRODIURIL) 25 MG tablet Take 1 tablet by mouth once daily 90 tablet 0  . HYDROcodone-acetaminophen (NORCO/VICODIN) 5-325 MG tablet  Take 1 tablet by mouth every 8 (eight) hours as needed for moderate pain.     . hydroxychloroquine (PLAQUENIL) 200 MG tablet Take 400 mg by mouth daily.    . meloxicam (MOBIC) 15 MG tablet Take 15 mg by mouth daily.  1  . methocarbamol (ROBAXIN) 750 MG tablet Take by mouth.    . metoprolol succinate (TOPROL-XL) 50 MG 24 hr tablet TAKE 1 TABLET BY MOUTH ONCE DAILY WITH  OR  IMMEDIATELY  FOLLOWING  A  MEAL 90 tablet 3  . primidone (MYSOLINE) 50 MG tablet Take 50-100 mg by mouth. 100 mg in  the morning and 50 mg in the evening    . Tamsulosin HCl (FLOMAX) 0.4 MG CAPS Take 0.4 mg by mouth at bedtime.     . valACYclovir (VALTREX) 500 MG tablet TAKE 1 CAPLET BY MOUTH TWICE DAILY FOR 5 7 DAYS AS NEEDED FOR FLARES     No facility-administered medications prior to visit.    Allergies  Allergen Reactions  . Lisinopril Other (See Comments) and Cough    Cough  . Other Other (See Comments)    BLOOD PRODUCT REFUSAL (patient is a Jehovah's Witness)     [name] Review of Systems  Constitutional: Negative for activity change, appetite change, chills and fever.  HENT: Negative for congestion, nosebleeds, trouble swallowing and voice change.   Respiratory: Negative for cough, shortness of breath and wheezing.   Gastrointestinal: Negative for diarrhea, nausea and vomiting.  Genitourinary: Negative for difficulty urinating, dysuria, flank pain and hematuria.  Musculoskeletal: see hpi Neurological: Negative for dizziness, speech difficulty, light-headedness and numbness.  See HPI. All other review of systems negative.     Objective:    Physical Exam  BP 120/68 (BP Location: Left Arm, Patient Position: Sitting, Cuff Size: Large)   Pulse 88   Temp (!) 97.3 F (36.3 C) (Oral)   Resp 17   Ht 5' 5.5" (1.664 m)   Wt 223 lb 12.8 oz (101.5 kg)   SpO2 98%   BMI 36.68 kg/m  Wt Readings from Last 3 Encounters:  10/26/19 223 lb 12.8 oz (101.5 kg)  08/14/19 220 lb 3.2 oz (99.9 kg)   07/03/19 218 lb (98.9 kg)   Physical Exam  Constitutional: Oriented to person, place, and time. Appears well-developed and well-nourished.  HENT:  Head: Normocephalic and atraumatic.  Eyes: Conjunctivae and EOM are normal.  Cardiovascular: Normal rate, regular rhythm, normal heart sounds and intact distal pulses.  No murmur heard. Pulmonary/Chest: Effort normal and breath sounds normal. No stridor. No respiratory distress. Has no wheezes.  Neurological: Is alert and oriented to person, place, and time.  Skin: Skin is warm. Capillary refill takes less than 2 seconds.  Psychiatric: Has a normal mood and affect. Behavior is normal. Judgment and thought content normal.    Health Maintenance Due  Topic Date Due  . PNA vac Low Risk Adult (2 of 2 - PCV13) 02/12/2014    There are no preventive care reminders to display for this patient.  Lab Results  Component Value Date   TSH 1.070 10/31/2018   Lab Results  Component Value Date   WBC 5.1 08/14/2019   HGB 12.8 (L) 08/14/2019   HCT 40.0 08/14/2019   MCV 97.8 08/14/2019   PLT 197 08/14/2019   Lab Results  Component Value Date   NA 141 08/14/2019   K 3.8 08/14/2019   CO2 30 08/14/2019   GLUCOSE 81 08/14/2019   BUN 23 08/14/2019   CREATININE 1.36 (H) 08/14/2019   BILITOT 0.3 08/14/2019   ALKPHOS 156 (H) 08/14/2019   AST 20 08/14/2019   ALT 15 08/14/2019   PROT 7.2 08/14/2019   ALBUMIN 3.7 08/14/2019   CALCIUM 8.8 (L) 08/14/2019   ANIONGAP 8 08/14/2019   Lab Results  Component Value Date   CHOL 168 02/01/2017   Lab Results  Component Value Date   HDL 69 02/01/2017   Lab Results  Component Value Date   LDLCALC 86 02/01/2017   Lab Results  Component Value Date   TRIG 65 02/01/2017   Lab Results  Component  Value Date   CHOLHDL 2.4 02/01/2017   Lab Results  Component Value Date   HGBA1C 5.5 10/31/2018      Assessment & Plan:   Problem List Items Addressed This Visit      Cardiovascular and Mediastinum    Hypertension (Chronic)  -  Patient's blood pressure is at goal of 139/89 or less. Condition is stable. Continue current medications and treatment plan. I recommend that you exercise for 30-45 minutes 5 days a week. I also recommend a balanced diet with fruits and vegetables every day, lean meats, and little fried foods. The DASH diet (you can find this online) is a good example of this.      Other   Marginal zone lymphoma (HCC) (Chronic)  -  The current medical regimen is effective;  continue present plan and medications.    Other Visit Diagnoses    Need for prophylactic vaccination and inoculation against influenza    -  Primary   Need for prophylactic vaccination against Streptococcus pneumoniae (pneumococcus)       Relevant Orders   Pneumococcal conjugate vaccine 13-valent IM   Irregular heart beat    - chronic, stable      No orders of the defined types were placed in this encounter.   Follow-up: No follow-ups on file.   A total of 30 minutes were spent face-to-face with the patient during this encounter and over half of that time was spent on counseling and coordination of care.    [name], MD

## 2019-11-03 ENCOUNTER — Other Ambulatory Visit: Payer: Self-pay | Admitting: Neurosurgery

## 2019-11-03 DIAGNOSIS — I10 Essential (primary) hypertension: Secondary | ICD-10-CM | POA: Diagnosis not present

## 2019-11-03 DIAGNOSIS — M48062 Spinal stenosis, lumbar region with neurogenic claudication: Secondary | ICD-10-CM | POA: Diagnosis not present

## 2019-11-03 DIAGNOSIS — Z6837 Body mass index (BMI) 37.0-37.9, adult: Secondary | ICD-10-CM | POA: Diagnosis not present

## 2019-11-23 ENCOUNTER — Inpatient Hospital Stay: Admit: 2019-11-23 | Payer: Medicare HMO | Admitting: Neurosurgery

## 2019-11-23 SURGERY — POSTERIOR LUMBAR FUSION 1 LEVEL
Anesthesia: General | Site: Back

## 2019-11-26 ENCOUNTER — Ambulatory Visit (INDEPENDENT_AMBULATORY_CARE_PROVIDER_SITE_OTHER): Payer: Medicare HMO | Admitting: Family Medicine

## 2019-11-26 VITALS — BP 120/68 | Ht 66.0 in | Wt 219.0 lb

## 2019-11-26 DIAGNOSIS — Z0000 Encounter for general adult medical examination without abnormal findings: Secondary | ICD-10-CM | POA: Diagnosis not present

## 2019-11-26 NOTE — Progress Notes (Signed)
Presents today for [employer] Visit   Date of last exam: 06/30/2019  Interpreter used for this visit?   No  I connected with  Bryan [name] on 11/26/19 by a telephone  and verified that I am speaking with the correct person using two identifiers.   I discussed the limitations of evaluation and management by telemedicine. The patient expressed understanding and agreed to proceed.   Patient Care Team: [name], MD as PCP - General (Internal Medicine) Croitoru, Dani Gobble, MD as PCP - Cardiology (Cardiology) [name] as Physician Assistant (Emergency Medicine) [name], MD as Consulting Physician (Orthopedic Surgery) [name], MD as Consulting Physician (Oncology) Helayne Seminole, MD as Consulting Physician (Otolaryngology) Edrick Kins, DPM as Consulting Physician (Podiatry)   Other items to address today:   Discussed Eye/Dental Discussed immunization Follow up scheduled 01/19/2018 @ 2:00 pm  Other Screening: Last screening for diabetes: 10/31/2018   ADVANCE DIRECTIVES: Discussed: yes On File: no Materials Provided: no  Copy requested  Immunization status:  Immunization History  Administered Date(s) Administered  . Fluad Quad(high Dose 65+) 10/12/2019  . Influenza,inj,Quad PF,6+ Mos 07/20/2015, 08/12/2017  . Influenza-Unspecified 08/12/2014, 09/18/2016  . Pneumococcal Polysaccharide-23 02/12/2013  . Tdap 12/18/2015     Health Maintenance Due  Topic Date Due  . PNA vac Low Risk Adult (2 of 2 - PCV13) 02/12/2014     Functional Status Survey: Is the patient deaf or have difficulty hearing?: Yes(HEARING AIDS IN BOTH EARS) Does the patient have difficulty concentrating, remembering, or making decisions?: No Does the patient have difficulty walking or climbing stairs?: Yes(BACK PAIN) Does the patient have difficulty dressing or bathing?: No Does the patient have difficulty doing errands alone such  as visiting a doctor's office or shopping?: No   6CIT Screen 11/26/2019  What Year? 0 points  What month? 0 points  What time? 0 points  Count back from 20 0 points  Months in reverse 0 points  Repeat phrase 0 points  Total Score 0        Clinical Support from 11/26/2019 in [facility]  AUDIT-C Score  3       Home Environment:   Lives in one story with wife No scattered rugs No grab bars  Adequate lighting/no clutter Yes trouble climbing stairs (back pain)   Timed warm up N/A   Patient Active Problem List   Diagnosis Date Noted  . Chest pain, rule out acute myocardial infarction 07/30/2018  . Elevated troponin   . Marginal zone lymphoma ([place]) 02/02/2018  . Lymphoma ([place]) 01/24/2018  . Parotid mass 10/28/2017  . Arthralgia of right lower leg 10/10/2017  . Class 2 severe obesity due to excess calories with serious comorbidity and body mass index (BMI) of 39.0 to 39.9 in adult ([place]) 03/07/2017  . ACE-inhibitor cough 03/07/2017  . Lumbago 06/21/2016  . Spinal stenosis, lumbar region, with neurogenic claudication 02/15/2015  . Obesity (BMI 30-39.9) 12/07/2013  . Refusal of blood transfusions as patient is Jehovah's Witness 12/07/2013  . Rheumatoid arthritis ([place]) 10/26/2012  . Nonspecific abnormal finding in stool contents 07/28/2012  . Multiple joint pain 06/05/2012  . History of tobacco use-  06/05/2012  . Asthmatic bronchitis 12/15/2011  . BPH (benign prostatic hyperplasia) 12/15/2011  . Essential tremor 12/15/2011  . Hypertension 12/15/2011     Past Medical History:  Diagnosis Date  . Anemia   . Arthritis    hands, knees, cervical  area. Back pain. Rheumatoid arthritis- weekly injections.  [name] BPH (benign prostatic hypertrophy)   . Cancer ([place])   . Essential tremor   . GERD (gastroesophageal reflux disease)    reports for indigestion he uses mustard   . Hearing deficit    wears hearing aids bilateral  . HTN (hypertension)   . Obesity   .  Refusal of blood transfusions as patient is Jehovah's Witness   . Right bundle branch block    history of  . Seizures (HCC)    AS A CHILD. only esential tremors now.  . Sleep apnea    cpap - settings at 9 per patient   . Ulcer      Past Surgical History:  Procedure Laterality Date  . ANTERIOR CERVICAL DECOMP/DISCECTOMY FUSION N/A 03/30/2013   Procedure: ANTERIOR CERVICAL DECOMPRESSION/DISCECTOMY FUSION 2 LEVELS;  Surgeon: Otilio Connors, MD;  Location: [facility];  Service: Neurosurgery;  Laterality: N/A;  C4-5 C5-6 Anterior cervical decompression/diskectomy/fusion/Allograft/Plate  . CHOLECYSTECTOMY N/A 12/07/2013   Procedure: LAPAROSCOPIC CHOLECYSTECTOMY WITH INTRAOPERATIVE CHOLANGIOGRAM;  Surgeon: Adin Hector, MD;  Location: [facility];  Service: General;  Laterality: N/A;  . COLONOSCOPY    . DECOMPRESSIVE LUMBAR LAMINECTOMY LEVEL 2 N/A 02/15/2015   Procedure: COMPLETE DECOMPRESSIVE LUMBAR LAMINECTOMY L4-L5/ FORAMINOTOMY TO L4 NERVE ROOT AND L5 NERVE ROOT BILATERALLY;  Surgeon: [name], MD;  Location: [facility];  Service: Orthopedics;  Laterality: N/A;  . EYE SURGERY     right, growth excision  . LUMBAR LAMINECTOMY/DECOMPRESSION MICRODISCECTOMY Left 01/18/2016   Procedure:  DECOMPRESSION L4-L5 MICRODISCECTOMY L4-L5 ON LEFT FOR SPINAL STENOSIS;  Surgeon: [name], MD;  Location: [facility];  Service: Orthopedics;  Laterality: Left;  . PAROTIDECTOMY Left 01/24/2018   Procedure: INCISIONAL BIOPSY OF LEFT PAROTID    MASS;  Surgeon: Helayne Seminole, MD;  Location: [facility];  Service: ENT;  Laterality: Left;  . SPINE SURGERY    . TONSILLECTOMY    . VASECTOMY    . WRIST GANGLION EXCISION Left      Family History  Problem Relation Age of Onset  . Colon cancer Maternal Grandfather   . Kidney disease Brother   . Hypertension Brother   . Diabetes Brother   . Stroke Maternal Grandmother   . Diabetes Mother   . Hypertension Mother   . CAD Father        died of MI at age 64  .  Hypertension Father   . Diabetes Paternal Aunt        x 4 aunts     Social History   Socioeconomic History  . Marital status: Married    Spouse name: Not on file  . Number of children: 2  . Years of education: Not on file  . Highest education level: Not on file  Occupational History  . Occupation: bus Education administrator: RETIRED  Tobacco Use  . Smoking status: Former Smoker    Types: Cigarettes    Quit date: 12/14/1972    Years since quitting: 46.9  . Smokeless tobacco: Never Used  Substance and Sexual Activity  . Alcohol use: Yes    Comment: occasionally  . Drug use: No  . Sexual activity: Yes    Birth control/protection: None  Other Topics Concern  . Not on file  Social History Narrative  . Not on file   Social Determinants of Health   Financial Resource Strain:   . Difficulty of Paying Living Expenses: Not on file  Food Insecurity:   .  Worried About Charity fundraiser in the Last Year: Not on file  . Ran Out of Food in the Last Year: Not on file  Transportation Needs:   . Lack of Transportation (Medical): Not on file  . Lack of Transportation (Non-Medical): Not on file  Physical Activity:   . Days of Exercise per Week: Not on file  . Minutes of Exercise per Session: Not on file  Stress:   . Feeling of Stress : Not on file  Social Connections:   . Frequency of Communication with Friends and Family: Not on file  . Frequency of Social Gatherings with Friends and Family: Not on file  . Attends Religious Services: Not on file  . Active Member of Clubs or Organizations: Not on file  . Attends Archivist Meetings: Not on file  . Marital Status: Not on file  Intimate Partner Violence:   . Fear of Current or Ex-Partner: Not on file  . Emotionally Abused: Not on file  . Physically Abused: Not on file  . Sexually Abused: Not on file     Allergies  Allergen Reactions  . Lisinopril Other (See Comments) and Cough    Cough  . Other Other (See Comments)     BLOOD PRODUCT REFUSAL (patient is a Jehovah's Witness)      Prior to Admission medications   Medication Sig Start Date End Date Taking? Authorizing Provider  atorvastatin (LIPITOR) 10 MG tablet Take 10 mg by mouth daily.   Yes [provider]  hydrochlorothiazide (HYDRODIURIL) 25 MG tablet Take 1 tablet by mouth once daily 07/05/19  Yes Stallings, Zoe A, MD  HYDROcodone-acetaminophen (NORCO/VICODIN) 5-325 MG tablet Take 1 tablet by mouth every 8 (eight) hours as needed for moderate pain.    Yes Bjorn Pippin, PA-C  hydroxychloroquine (PLAQUENIL) 200 MG tablet Take 400 mg by mouth daily. 04/03/19  Yes [provider]  meloxicam (MOBIC) 15 MG tablet Take 15 mg by mouth daily. 07/11/18  Yes [provider]  methocarbamol (ROBAXIN) 750 MG tablet Take by mouth.   Yes [provider]  metoprolol succinate (TOPROL-XL) 50 MG 24 hr tablet TAKE 1 TABLET BY MOUTH ONCE DAILY WITH  OR  IMMEDIATELY  FOLLOWING  A  MEAL 10/31/18  Yes Shawnee Knapp, MD  primidone (MYSOLINE) 50 MG tablet Take 50-100 mg by mouth. 100 mg in the morning and 50 mg in the evening   Yes [provider]  Tamsulosin HCl (FLOMAX) 0.4 MG CAPS Take 0.4 mg by mouth at bedtime.    Yes [provider]  citalopram (CELEXA) 20 MG tablet Take 20 mg by mouth daily. 10/10/18   [provider]  valACYclovir (VALTREX) 500 MG tablet TAKE 1 CAPLET BY MOUTH TWICE DAILY FOR 5 7 DAYS AS NEEDED FOR FLARES 10/21/18   [provider]     Depression screen [facility] 2/9 11/26/2019 10/26/2019 06/30/2019 04/24/2019 10/31/2018  Decreased Interest 0 0 0 0 0  Down, Depressed, Hopeless 0 0 0 0 0  PHQ - 2 Score 0 0 0 0 0  Altered sleeping - - - - -  Tired, decreased energy - - - - -  Change in appetite - - - - -  Feeling bad or failure about yourself  - - - - -  Trouble concentrating - - - - -  Moving slowly or fidgety/restless - - - - -  Suicidal thoughts - - - - -  PHQ-9 Score - - - - -  Some recent data might be hidden     Fall Risk  11/26/2019 10/26/2019 06/30/2019 04/24/2019 10/31/2018  Falls in the past year? 0 0 0 0 0  Number falls in past yr: 0 0 - - 0  Comment - - - - -  Injury with Fall? 0 0 0 - 0  Comment - - - - -  Follow up Falls evaluation completed Falls evaluation completed - - -      PHYSICAL EXAM: BP 120/68 Comment: PER PATIENT  Ht 5\' 6"  (1.676 m)   Wt 219 lb (99.3 kg) Comment: PER PATIENT  BMI 35.35 kg/m    Wt Readings from Last 3 Encounters:  11/26/19 219 lb (99.3 kg)  10/26/19 223 lb 12.8 oz (101.5 kg)  08/14/19 220 lb 3.2 oz (99.9 kg)   Medicare annual wellness visit, subsequent     Education/Counseling provided regarding diet and exercise, prevention of chronic diseases, smoking/tobacco cessation, if applicable, and reviewed "Covered Medicare Preventive Services."

## 2019-11-26 NOTE — Patient Instructions (Addendum)
Thank you for taking time to come for your Medicare Wellness Visit. I appreciate your ongoing commitment to your health goals. Please review the following plan we discussed and let me know if I can assist you in the future.  Bryan Kennedy LPN  Preventive Care 64 Years and Older, Male Preventive care refers to lifestyle choices and visits with your health care provider that can promote health and wellness. This includes:  A yearly physical exam. This is also called an annual well check.  Regular dental and eye exams.  Immunizations.  Screening for certain conditions.  Healthy lifestyle choices, such as diet and exercise. What can I expect for my preventive care visit? Physical exam Your health care provider will check:  Height and weight. These may be used to calculate body mass index (BMI), which is a measurement that tells if you are at a healthy weight.  Heart rate and blood pressure.  Your skin for abnormal spots. Counseling Your health care provider may ask you questions about:  Alcohol, tobacco, and drug use.  Emotional well-being.  Home and relationship well-being.  Sexual activity.  Eating habits.  History of falls.  Memory and ability to understand (cognition).  Work and work Statistician. What immunizations do I need?  Influenza (flu) vaccine  This is recommended every year. Tetanus, diphtheria, and pertussis (Tdap) vaccine  You may need a Td booster every 10 years. Varicella (chickenpox) vaccine  You may need this vaccine if you have not already been vaccinated. Zoster (shingles) vaccine  You may need this after age 66. Pneumococcal conjugate (PCV13) vaccine  One dose is recommended after age 84. Pneumococcal polysaccharide (PPSV23) vaccine  One dose is recommended after age 88. Measles, mumps, and rubella (MMR) vaccine  You may need at least one dose of MMR if you were born in 1957 or later. You may also need a second dose. Meningococcal  conjugate (MenACWY) vaccine  You may need this if you have certain conditions. Hepatitis A vaccine  You may need this if you have certain conditions or if you travel or work in places where you may be exposed to hepatitis A. Hepatitis B vaccine  You may need this if you have certain conditions or if you travel or work in places where you may be exposed to hepatitis B. Haemophilus influenzae type b (Hib) vaccine  You may need this if you have certain conditions. You may receive vaccines as individual doses or as more than one vaccine together in one shot (combination vaccines). Talk with your health care provider about the risks and benefits of combination vaccines. What tests do I need? Blood tests  Lipid and cholesterol levels. These may be checked every 5 years, or more frequently depending on your overall health.  Hepatitis C test.  Hepatitis B test. Screening  Lung cancer screening. You may have this screening every year starting at age 24 if you have a 30-pack-year history of smoking and currently smoke or have quit within the past 15 years.  Colorectal cancer screening. All adults should have this screening starting at age 52 and continuing until age 61. Your health care provider may recommend screening at age 57 if you are at increased risk. You will have tests every 1-10 years, depending on your results and the type of screening test.  Prostate cancer screening. Recommendations will vary depending on your family history and other risks.  Diabetes screening. This is done by checking your blood sugar (glucose) after you have not eaten for  a while (fasting). You may have this done every 1-3 years.  Abdominal aortic aneurysm (AAA) screening. You may need this if you are a current or former smoker.  Sexually transmitted disease (STD) testing. Follow these instructions at home: Eating and drinking  Eat a diet that includes fresh fruits and vegetables, whole grains, lean  protein, and low-fat dairy products. Limit your intake of foods with high amounts of sugar, saturated fats, and salt.  Take vitamin and mineral supplements as recommended by your health care provider.  Do not drink alcohol if your health care provider tells you not to drink.  If you drink alcohol: ? Limit how much you have to 0-2 drinks a day. ? Be aware of how much alcohol is in your drink. In the [place] one drink equals one 12 oz bottle of beer (355 mL), one 5 oz glass of wine (148 mL), or one 1 oz glass of hard liquor (44 mL). Lifestyle  Take daily care of your teeth and gums.  Stay active. Exercise for at least 30 minutes on 5 or more days each week.  Do not use any products that contain nicotine or tobacco, such as cigarettes, e-cigarettes, and chewing tobacco. If you need help quitting, ask your health care provider.  If you are sexually active, practice safe sex. Use a condom or other form of protection to prevent STIs (sexually transmitted infections).  Talk with your health care provider about taking a low-dose aspirin or statin. What's next?  Visit your health care provider once a year for a well check visit.  Ask your health care provider how often you should have your eyes and teeth checked.  Stay up to date on all vaccines. This information is not intended to replace advice given to you by your health care provider. Make sure you discuss any questions you have with your health care provider. Document Revised: 10/23/2018 Document Reviewed: 10/23/2018 Elsevier Patient Education  2020 Elsevier Inc.  

## 2019-12-01 DIAGNOSIS — M48062 Spinal stenosis, lumbar region with neurogenic claudication: Secondary | ICD-10-CM | POA: Diagnosis not present

## 2019-12-14 ENCOUNTER — Other Ambulatory Visit: Payer: Self-pay | Admitting: Neurosurgery

## 2019-12-16 NOTE — Progress Notes (Signed)
[place], [place] - [id number] [place] [place] [address] Phone: [phone] Fax: [phone]      Your procedure is scheduled on Monday, December 21, 2019.  Report to [facility] "A" at 12:20 PM., and check in at the Admitting office.  Call this number if you have problems the morning of surgery:  [phone]  Call [phone] if you have any questions prior to your surgery date Monday-Friday 8am-4pm    Remember:  Do not eat or drink after midnight the night before your surgery     Take these medicines the morning of surgery with A SIP OF WATER: citalopram (CELEXA)  meloxicam (MOBIC)  methocarbamol (ROBAXIN) metoprolol succinate (TOPROL-XL) primidone (MYSOLINE)  7 days prior to surgery STOP taking any Aspirin (unless otherwise instructed by your surgeon), Aleve, Naproxen, Ibuprofen, Motrin, Advil, Goody's, BC's, all herbal medications, fish oil, and all vitamins.    The Morning of Surgery  Do not wear jewelry.  Do not wear lotions, powders, colognes, or deodorant  Men may shave face and neck.  Do not bring valuables to the hospital.  [facility] is not responsible for any belongings or valuables.  If you are a smoker, DO NOT Smoke 24 hours prior to surgery  If you wear a CPAP at night please bring your mask the morning of surgery   Remember that you must have someone to transport you home after your surgery, and remain with you for 24 hours if you are discharged the same day.   Please bring cases for contacts, glasses, hearing aids, dentures or bridgework because it cannot be worn into surgery.    Leave your suitcase in the car.  After surgery it may be brought to your room.  For patients admitted to the hospital, discharge time will be determined by your treatment team.  Patients discharged the day of surgery will not be allowed to drive home.    Special instructions:   [place]-  Preparing For Surgery  Before surgery, you can play an important role. Because skin is not sterile, your skin needs to be as free of germs as possible. You can reduce the number of germs on your skin by washing with CHG (chlorahexidine gluconate) Soap before surgery.  CHG is an antiseptic cleaner which kills germs and bonds with the skin to continue killing germs even after washing.    Oral Hygiene is also important to reduce your risk of infection.  Remember - BRUSH YOUR TEETH THE MORNING OF SURGERY WITH YOUR REGULAR TOOTHPASTE  Please do not use if you have an allergy to CHG or antibacterial soaps. If your skin becomes reddened/irritated stop using the CHG.  Do not shave (including legs and underarms) for at least 48 hours prior to first CHG shower. It is OK to shave your face.  Please follow these instructions carefully.   1. Shower the NIGHT BEFORE SURGERY and the MORNING OF SURGERY with CHG Soap.   2. If you chose to wash your hair, wash your hair first as usual with your normal shampoo.  3. After you shampoo, rinse your hair and body thoroughly to remove the shampoo.  4. Use CHG as you would any other liquid soap. You can apply CHG directly to the skin and wash gently with a scrungie or a clean washcloth.   5. Apply the CHG Soap to your body ONLY FROM THE NECK DOWN.  Do not use on open wounds or open sores.  Avoid contact with your eyes, ears, mouth and genitals (private parts). Wash Face and genitals (private parts)  with your normal soap.   6. Wash thoroughly, paying special attention to the area where your surgery will be performed.  7. Thoroughly rinse your body with warm water from the neck down.  8. DO NOT shower/wash with your normal soap after using and rinsing off the CHG Soap.  9. Pat yourself dry with a CLEAN TOWEL.  10. Wear CLEAN PAJAMAS to bed the night before surgery, wear comfortable clothes the morning of surgery  11. Place CLEAN SHEETS on your bed the night of  your first shower and DO NOT SLEEP WITH PETS.    Day of Surgery:  Please shower the morning of surgery with the CHG soap Do not apply any deodorants/lotions. Please wear clean clothes to the hospital/surgery center.   Remember to brush your teeth WITH YOUR REGULAR TOOTHPASTE.   Please read over the following fact sheets that you were given.

## 2019-12-17 ENCOUNTER — Encounter (HOSPITAL_COMMUNITY)
Admission: RE | Admit: 2019-12-17 | Discharge: 2019-12-17 | Disposition: A | Discharge status: Home / Self Care | Payer: Medicare HMO | Source: Ambulatory Visit | Attending: Neurosurgery | Admitting: Neurosurgery

## 2019-12-17 ENCOUNTER — Other Ambulatory Visit (HOSPITAL_COMMUNITY)
Admission: RE | Admit: 2019-12-17 | Discharge: 2019-12-17 | Disposition: A | Discharge status: Home / Self Care | Payer: Medicare HMO | Source: Ambulatory Visit | Attending: Neurosurgery | Admitting: Neurosurgery

## 2019-12-17 ENCOUNTER — Other Ambulatory Visit: Payer: Self-pay

## 2019-12-17 ENCOUNTER — Encounter (HOSPITAL_COMMUNITY): Payer: Self-pay

## 2019-12-17 DIAGNOSIS — M069 Rheumatoid arthritis, unspecified: Secondary | ICD-10-CM | POA: Diagnosis not present

## 2019-12-17 DIAGNOSIS — M48061 Spinal stenosis, lumbar region without neurogenic claudication: Secondary | ICD-10-CM | POA: Diagnosis not present

## 2019-12-17 DIAGNOSIS — Z87891 Personal history of nicotine dependence: Secondary | ICD-10-CM | POA: Insufficient documentation

## 2019-12-17 DIAGNOSIS — G250 Essential tremor: Secondary | ICD-10-CM | POA: Insufficient documentation

## 2019-12-17 DIAGNOSIS — Z6836 Body mass index (BMI) 36.0-36.9, adult: Secondary | ICD-10-CM | POA: Insufficient documentation

## 2019-12-17 DIAGNOSIS — E669 Obesity, unspecified: Secondary | ICD-10-CM | POA: Insufficient documentation

## 2019-12-17 DIAGNOSIS — Z20822 Contact with and (suspected) exposure to covid-19: Secondary | ICD-10-CM | POA: Insufficient documentation

## 2019-12-17 DIAGNOSIS — Z79899 Other long term (current) drug therapy: Secondary | ICD-10-CM | POA: Diagnosis not present

## 2019-12-17 DIAGNOSIS — Z01812 Encounter for preprocedural laboratory examination: Secondary | ICD-10-CM | POA: Diagnosis not present

## 2019-12-17 DIAGNOSIS — Z8572 Personal history of non-Hodgkin lymphomas: Secondary | ICD-10-CM | POA: Diagnosis not present

## 2019-12-17 DIAGNOSIS — G4733 Obstructive sleep apnea (adult) (pediatric): Secondary | ICD-10-CM | POA: Insufficient documentation

## 2019-12-17 DIAGNOSIS — Z974 Presence of external hearing-aid: Secondary | ICD-10-CM | POA: Diagnosis not present

## 2019-12-17 DIAGNOSIS — I4510 Unspecified right bundle-branch block: Secondary | ICD-10-CM | POA: Diagnosis not present

## 2019-12-17 DIAGNOSIS — I10 Essential (primary) hypertension: Secondary | ICD-10-CM | POA: Insufficient documentation

## 2019-12-17 LAB — BASIC METABOLIC PANEL
Anion gap: 9 (ref 5–15)
BUN: 18 mg/dL (ref 8–23)
CO2: 29 mmol/L (ref 22–32)
Calcium: 9.1 mg/dL (ref 8.9–10.3)
Chloride: 103 mmol/L (ref 98–111)
Creatinine, Ser: 1.19 mg/dL (ref 0.61–1.24)
GFR calc Af Amer: >60 mL/min (ref >60)
GFR calc non Af Amer: 59 mL/min — ABNORMAL LOW (ref >60)
Glucose, Bld: 83 mg/dL (ref 70–99)
Potassium: 3.7 mmol/L (ref 3.5–5.1)
Sodium: 141 mmol/L (ref 135–145)

## 2019-12-17 LAB — CBC
HCT: 43.2 % (ref 39.0–52.0)
Hemoglobin: 14.2 g/dL (ref 13.0–17.0)
MCH: 31.5 pg (ref 26.0–34.0)
MCHC: 32.9 g/dL (ref 30.0–36.0)
MCV: 95.8 fL (ref 80.0–100.0)
Platelets: 203 K/uL (ref 150–400)
RBC: 4.51 MIL/uL (ref 4.22–5.81)
RDW: 13.5 % (ref 11.5–15.5)
WBC: 5.5 K/uL (ref 4.0–10.5)
nRBC: 0 % (ref 0.0–0.2)

## 2019-12-17 LAB — GLUCOSE, CAPILLARY: Glucose-Capillary: 211 mg/dL — ABNORMAL HIGH (ref 70–99)

## 2019-12-17 LAB — SURGICAL PCR SCREEN
MRSA, PCR: NEGATIVE
Staphylococcus aureus: NEGATIVE

## 2019-12-17 LAB — SARS CORONAVIRUS 2 (TAT 6-24 HRS): SARS Coronavirus 2: NEGATIVE

## 2019-12-17 LAB — NO BLOOD PRODUCTS

## 2019-12-17 NOTE — Progress Notes (Signed)
PCP:  Delia Chimes, MD Cardiologist: denies  EKG:  12/17/19 CXR:  N/A ECHO:  07/30/18 Stress Test:  denies Cardiac Cath:  denies  Covid 12/17/19  Patient denies shortness of breath, fever, cough, and chest pain at PAT appointment.  Patient verbalized understanding of instructions provided today at the PAT appointment.  Patient asked to review instructions at home and day of surgery.

## 2019-12-17 NOTE — Progress Notes (Signed)
[place], [place] - [id number] [place] [place] [address] Phone: [phone] Fax: [phone]      Your procedure is scheduled on Monday, December 21, 2019.  Report to [facility]" at 12:20 PM., and check in at the Admitting office.  Call this number if you have problems the morning of surgery:  [phone]  Call [phone] if you have any questions prior to your surgery date Monday-Friday 8am-4pm    Remember:  Do not eat or drink after midnight the night before your surgery     Take these medicines the morning of surgery with A SIP OF WATER: citalopram (CELEXA)  methocarbamol (ROBAXIN) metoprolol succinate (TOPROL-XL) primidone (MYSOLINE)  7 days prior to surgery STOP taking any Aspirin (unless otherwise instructed by your surgeon), Aleve, Naproxen, Ibuprofen, Motrin, Advil, Goody's, BC's, all herbal medications, fish oil, and all vitamins.    The Morning of Surgery  Do not wear jewelry.  Do not wear lotions, powders, colognes, or deodorant  Men may shave face and neck.  Do not bring valuables to the hospital.  [facility] is not responsible for any belongings or valuables.  If you are a smoker, DO NOT Smoke 24 hours prior to surgery  If you wear a CPAP at night please bring your mask the morning of surgery   Remember that you must have someone to transport you home after your surgery, and remain with you for 24 hours if you are discharged the same day.   Please bring cases for contacts, glasses, hearing aids, dentures or bridgework because it cannot be worn into surgery.    Leave your suitcase in the car.  After surgery it may be brought to your room.  For patients admitted to the hospital, discharge time will be determined by your treatment team.  Patients discharged the day of surgery will not be allowed to drive home.    Special instructions:   [place]- Preparing For  Surgery  Before surgery, you can play an important role. Because skin is not sterile, your skin needs to be as free of germs as possible. You can reduce the number of germs on your skin by washing with CHG (chlorahexidine gluconate) Soap before surgery.  CHG is an antiseptic cleaner which kills germs and bonds with the skin to continue killing germs even after washing.    Oral Hygiene is also important to reduce your risk of infection.  Remember - BRUSH YOUR TEETH THE MORNING OF SURGERY WITH YOUR REGULAR TOOTHPASTE  Please do not use if you have an allergy to CHG or antibacterial soaps. If your skin becomes reddened/irritated stop using the CHG.  Do not shave (including legs and underarms) for at least 48 hours prior to first CHG shower. It is OK to shave your face.  Please follow these instructions carefully.   1. Shower the NIGHT BEFORE SURGERY and the MORNING OF SURGERY with CHG Soap.   2. If you chose to wash your hair, wash your hair first as usual with your normal shampoo.  3. After you shampoo, rinse your hair and body thoroughly to remove the shampoo.  4. Use CHG as you would any other liquid soap. You can apply CHG directly to the skin and wash gently with a scrungie or a clean washcloth.   5. Apply the CHG Soap to your body ONLY FROM THE NECK DOWN.  Do not use on open wounds or open sores. Avoid contact with  your eyes, ears, mouth and genitals (private parts). Wash Face and genitals (private parts)  with your normal soap.   6. Wash thoroughly, paying special attention to the area where your surgery will be performed.  7. Thoroughly rinse your body with warm water from the neck down.  8. DO NOT shower/wash with your normal soap after using and rinsing off the CHG Soap.  9. Pat yourself dry with a CLEAN TOWEL.  10. Wear CLEAN PAJAMAS to bed the night before surgery, wear comfortable clothes the morning of surgery  11. Place CLEAN SHEETS on your bed the night of your first  shower and DO NOT SLEEP WITH PETS.    Day of Surgery:  Please shower the morning of surgery with the CHG soap Do not apply any deodorants/lotions. Please wear clean clothes to the hospital/surgery center.   Remember to brush your teeth WITH YOUR REGULAR TOOTHPASTE.   Please read over the following fact sheets that you were given.

## 2019-12-18 NOTE — Progress Notes (Signed)
Anesthesia Chart Review:  Case: [id number] Date/Time: 12/21/19 0715   Procedure: PLIF - L4-L5 (N/A Back)   Anesthesia type: General   Pre-op diagnosis: Stenosis   Location: [facility] OR ROOM 20 / [place] OR   Surgeons: Kary Kos, MD      DISCUSSION: Patient is a 74 year old male scheduled for the above procedure.  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 Has hearing aids. Multiple spinal surgeries (C4-6 ACDF; L4-5 laminectomy 02/15/15; L3-4/4-5 decompression 01/18/16). BMI is consistent with obesity.    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 His preoperative CBC is WNL (H/H 14.2/43.2).   Patient seen by cardiologist Croitoru, Arelia Sneddon, MD in 07/2018 during admission for chest pain and dizziness. Serial Troponins mildly elevated but flat (0.06->0.07->0.05->0.04). Symptoms were somewhat atypical but cardiac CTA ordered which showed a calcium score of only 21 but mild-moderate LAD disease (25-50% proximal-mid LAD and focal 50-69% mid LAD). Risk factor modification recommended, but otherwise does not appear that he was scheduled for on-going cardiology follow-up. He has been followed by his PCP Dr. Nolon Rod, last visit 10/26/19. He denied SOB and chest pain per PAT RN visit.  BP at PAT was 181/72 and 201/79. Anesthesia APP was not notified at that time. It was only 120/68 at 11/26/19 PCP visit. Reported good BP control at home then. He is on HCTZ 25 mg and Toprol XL 50 mg daily.  Of note, there is a CBG documented as 211, but he is not a known diabetic and serum glucose checked about 15 minutes later was only 83. I did not add an A1c. Consider checking fasting CBG on the day of surgery.   I attempted to call Mr. Elter just after 3:00 PM on 12/18/19, but I could only leave a  voice message. In the interim, I reviewed available information with anesthesiologist Lillia Abed, MD. Will need to confirm that he is not having any recurrent/progressive CV symptoms since his cardiac imaging within the past 17 months--and inquire about home BP trends. Definitive anesthesia plan will depend on responses and evaluation findings. If he does not return call then anesthesia team will have to follow-up with him on the day of surgery. I updated Vanessa at Dr. Windy Carina office of this.  12/17/19 COVID-19 test was negative.     VS: BP (!) 201/79   Pulse 60   Temp 36.8 C (Oral)   Resp 18   Ht 5\' 6"  (1.676 m)   Wt 103.2 kg   SpO2 99%   BMI 36.72 kg/m    PROVIDERS: [name], MD is PCP. Last evaluation 10/26/19.  Sullivan Lone, MD is HEM-ONC. Last evaluation 08/14/19.  Lind Guest, MD is ENT Tana Coast, PA-C is who he sees for rheumatology [facility] - [place], see Care Everywhere)   LABS: Labs reviewed: Acceptable for surgery. See DISCUSSION. (all labs ordered are listed, but only abnormal results are displayed)  Labs Reviewed  GLUCOSE, CAPILLARY - Abnormal; Notable for the following components:      Result Value   Glucose-Capillary 211 (*)    All other components within normal limits  BASIC METABOLIC PANEL - Abnormal; Notable for the following components:   GFR calc non Af Amer 59 (*)    All other components within normal limits  SURGICAL PCR SCREEN  CBC  NO BLOOD PRODUCTS     IMAGES: CT L-spine 10/23/19: IMPRESSION: 1. Progressive severe central canal stenosis at L4-5 despite previous laminectomy. 2. Increased anterolisthesis and uncovering of a broad-based disc protrusion at L4-5. 3. Progression of moderate to severe foraminal stenosis bilaterally. Progression of mild left subarticular narrowing at L2-3 and L3-4. 4. Foraminal stenosis at L2-3 and L3-4, left greater than right is stable. 5. Mild foraminal narrowing bilaterally at L5-S1  is stable, right greater than left.    EKG: 12/17/19: Sinus rhythm with Premature atrial complexes Right bundle branch block Abnormal ECG No significant change since last tracing Confirmed by Shelva Majestic [phone]) on 12/17/2019 9:02:41 PM   CV: Echo 07/30/18: Study Conclusions  - Left ventricle: The cavity size was normal. Wall thickness was  normal. Systolic function was normal. The estimated ejection  fraction was in the range of 55% to 60%. Wall motion was normal;  there were no regional wall motion abnormalities. Doppler  parameters are consistent with abnormal left ventricular  relaxation (grade 1 diastolic dysfunction).  - Aortic valve: There was mild regurgitation.  - Pulmonary arteries: Systolic pressure was mildly increased. PA  peak pressure: 38 mm Hg (S).   CT Coronary 07/30/18: - Aorta:  Normal size.  No calcifications.  No dissection. - Aortic Valve:  Trileaflet.  No calcifications. - Coronary Arteries:  Normal coronary origin.  Right dominance. - RCA is a large dominant artery that gives rise to PDA and PLVB. There is minimal on-calcified plaque with stenosis 0-25%. - Left main is a large artery that gives rise to LAD and LCX arteries. Left main has minimal stenosis. - LAD is a large vessel that has mild mixed plaque in the proximal and mid portio with stenosis 25-50% and a focal 50-69% stenosis in the mid LAD. - LCX is a non-dominant artery that gives rise to one large OM1 branch. There is minimal plaque with associated stenosis 0-25%. - Other findings: Normal pulmonary vein drainage into the left atrium. Normal let atrial appendage without a thrombus. Normal size of the pulmonary artery. IMPRESSION: 1. Coronary calcium score of 21. This was 50 percentile for age and sex matched control. 2. Normal coronary origin with right dominance. 3. There is mild to moderate CAD in the proximal and mid LAD. Aggressive risk factor modification is  recommended.   Past Medical History:  Diagnosis Date  . Anemia   . Arthritis    hands, knees, cervical area. Back pain. Rheumatoid arthritis- weekly injections.  [name] BPH (benign prostatic hypertrophy)   . Cancer ([place])   . Essential tremor   . GERD (gastroesophageal reflux disease)    reports for indigestion he uses mustard   . Hearing deficit    wears hearing aids bilateral  . HTN (hypertension)   . Obesity   . Refusal of blood transfusions as patient is Jehovah's Witness   . Right bundle branch block    history of  . Seizures (HCC)    AS A CHILD. only esential tremors now.  . Sleep apnea    cpap - settings at 9 per patient   . Ulcer     Past Surgical History:  Procedure Laterality Date  . ANTERIOR CERVICAL DECOMP/DISCECTOMY FUSION N/A 03/30/2013   Procedure: ANTERIOR CERVICAL DECOMPRESSION/DISCECTOMY FUSION 2 LEVELS;  Surgeon: Otilio Connors, MD;  Location: [facility];  Service: Neurosurgery;  Laterality: N/A;  C4-5 C5-6 Anterior cervical decompression/diskectomy/fusion/Allograft/Plate  . CHOLECYSTECTOMY N/A 12/07/2013   Procedure: LAPAROSCOPIC CHOLECYSTECTOMY WITH INTRAOPERATIVE CHOLANGIOGRAM;  Surgeon:  Adin Hector, MD;  Location: [facility]S;  Service: General;  Laterality: N/A;  . COLONOSCOPY    . DECOMPRESSIVE LUMBAR LAMINECTOMY LEVEL 2 N/A 02/15/2015   Procedure: COMPLETE DECOMPRESSIVE LUMBAR LAMINECTOMY L4-L5/ FORAMINOTOMY TO L4 NERVE ROOT AND L5 NERVE ROOT BILATERALLY;  Surgeon: [name], MD;  Location: [facility]S;  Service: Orthopedics;  Laterality: N/A;  . EYE SURGERY     right, growth excision  . LUMBAR LAMINECTOMY/DECOMPRESSION MICRODISCECTOMY Left 01/18/2016   Procedure:  DECOMPRESSION L4-L5 MICRODISCECTOMY L4-L5 ON LEFT FOR SPINAL STENOSIS;  Surgeon: [name], MD;  Location: [facility];  Service: Orthopedics;  Laterality: Left;  . PAROTIDECTOMY Left 01/24/2018   Procedure: INCISIONAL BIOPSY OF LEFT PAROTID    MASS;  Surgeon: Helayne Seminole, MD;  Location: [facility];  Service: ENT;  Laterality: Left;  . SPINE SURGERY    . TONSILLECTOMY    . VASECTOMY    . WRIST GANGLION EXCISION Left     MEDICATIONS: . atorvastatin (LIPITOR) 10 MG tablet  . citalopram (CELEXA) 20 MG tablet  . hydrochlorothiazide (HYDRODIURIL) 25 MG tablet  . meloxicam (MOBIC) 7.5 MG tablet  . methocarbamol (ROBAXIN) 750 MG tablet  . metoprolol succinate (TOPROL-XL) 50 MG 24 hr tablet  . primidone (MYSOLINE) 50 MG tablet  . Tamsulosin HCl (FLOMAX) 0.4 MG CAPS   No current facility-administered medications for this encounter.    Myra Gianotti, PA-C Surgical Short Stay/Anesthesiology [facility] Phone [phone] [facility] Phone [phone] 12/18/2019 4:35 PM

## 2019-12-22 ENCOUNTER — Other Ambulatory Visit (HOSPITAL_COMMUNITY)
Admission: RE | Admit: 2019-12-22 | Discharge: 2019-12-22 | Disposition: A | Discharge status: Home / Self Care | Payer: Medicare HMO | Source: Ambulatory Visit | Attending: Neurosurgery | Admitting: Neurosurgery

## 2019-12-22 DIAGNOSIS — M069 Rheumatoid arthritis, unspecified: Secondary | ICD-10-CM | POA: Diagnosis present

## 2019-12-22 DIAGNOSIS — Z974 Presence of external hearing-aid: Secondary | ICD-10-CM | POA: Diagnosis not present

## 2019-12-22 DIAGNOSIS — Z7289 Other problems related to lifestyle: Secondary | ICD-10-CM | POA: Diagnosis not present

## 2019-12-22 DIAGNOSIS — Z531 Procedure and treatment not carried out because of patient's decision for reasons of belief and group pressure: Secondary | ICD-10-CM | POA: Diagnosis present

## 2019-12-22 DIAGNOSIS — K219 Gastro-esophageal reflux disease without esophagitis: Secondary | ICD-10-CM | POA: Diagnosis present

## 2019-12-22 DIAGNOSIS — Z20822 Contact with and (suspected) exposure to covid-19: Secondary | ICD-10-CM | POA: Diagnosis present

## 2019-12-22 DIAGNOSIS — Z79899 Other long term (current) drug therapy: Secondary | ICD-10-CM | POA: Diagnosis not present

## 2019-12-22 DIAGNOSIS — I10 Essential (primary) hypertension: Secondary | ICD-10-CM | POA: Diagnosis present

## 2019-12-22 DIAGNOSIS — E669 Obesity, unspecified: Secondary | ICD-10-CM | POA: Diagnosis present

## 2019-12-22 DIAGNOSIS — Z791 Long term (current) use of non-steroidal anti-inflammatories (NSAID): Secondary | ICD-10-CM | POA: Diagnosis not present

## 2019-12-22 DIAGNOSIS — G4730 Sleep apnea, unspecified: Secondary | ICD-10-CM | POA: Diagnosis present

## 2019-12-22 DIAGNOSIS — M4316 Spondylolisthesis, lumbar region: Secondary | ICD-10-CM | POA: Diagnosis present

## 2019-12-22 DIAGNOSIS — M48062 Spinal stenosis, lumbar region with neurogenic claudication: Secondary | ICD-10-CM | POA: Diagnosis present

## 2019-12-22 DIAGNOSIS — M532X6 Spinal instabilities, lumbar region: Secondary | ICD-10-CM | POA: Diagnosis not present

## 2019-12-22 DIAGNOSIS — G250 Essential tremor: Secondary | ICD-10-CM | POA: Diagnosis present

## 2019-12-22 DIAGNOSIS — M5416 Radiculopathy, lumbar region: Secondary | ICD-10-CM | POA: Diagnosis present

## 2019-12-22 DIAGNOSIS — Z981 Arthrodesis status: Secondary | ICD-10-CM | POA: Diagnosis not present

## 2019-12-22 DIAGNOSIS — Z888 Allergy status to other drugs, medicaments and biological substances status: Secondary | ICD-10-CM | POA: Diagnosis not present

## 2019-12-22 DIAGNOSIS — Z6834 Body mass index (BMI) 34.0-34.9, adult: Secondary | ICD-10-CM | POA: Diagnosis not present

## 2019-12-22 DIAGNOSIS — M25561 Pain in right knee: Secondary | ICD-10-CM | POA: Diagnosis not present

## 2019-12-22 DIAGNOSIS — H9190 Unspecified hearing loss, unspecified ear: Secondary | ICD-10-CM | POA: Diagnosis present

## 2019-12-22 DIAGNOSIS — Z8249 Family history of ischemic heart disease and other diseases of the circulatory system: Secondary | ICD-10-CM | POA: Diagnosis not present

## 2019-12-22 DIAGNOSIS — N400 Enlarged prostate without lower urinary tract symptoms: Secondary | ICD-10-CM | POA: Diagnosis present

## 2019-12-22 DIAGNOSIS — M48061 Spinal stenosis, lumbar region without neurogenic claudication: Secondary | ICD-10-CM | POA: Diagnosis not present

## 2019-12-22 DIAGNOSIS — M4326 Fusion of spine, lumbar region: Secondary | ICD-10-CM | POA: Diagnosis not present

## 2019-12-22 DIAGNOSIS — M6281 Muscle weakness (generalized): Secondary | ICD-10-CM | POA: Diagnosis not present

## 2019-12-22 DIAGNOSIS — Z9049 Acquired absence of other specified parts of digestive tract: Secondary | ICD-10-CM | POA: Diagnosis not present

## 2019-12-22 DIAGNOSIS — Z87891 Personal history of nicotine dependence: Secondary | ICD-10-CM | POA: Diagnosis not present

## 2019-12-22 LAB — SARS CORONAVIRUS 2 (TAT 6-24 HRS): SARS Coronavirus 2: NEGATIVE

## 2019-12-24 ENCOUNTER — Other Ambulatory Visit: Payer: Self-pay

## 2019-12-24 DIAGNOSIS — I10 Essential (primary) hypertension: Secondary | ICD-10-CM

## 2019-12-24 MED ORDER — METOPROLOL SUCCINATE ER 50 MG PO TB24: ORAL_TABLET | ORAL | 0 refills | Status: DC

## 2019-12-24 MED ORDER — HYDROCHLOROTHIAZIDE 25 MG PO TABS: 25.0000 mg | ORAL_TABLET | Freq: Every day | ORAL | 0 refills | Status: DC

## 2019-12-24 NOTE — Anesthesia Preprocedure Evaluation (Addendum)
Anesthesia Evaluation  Patient identified by MRN, date of birth, ID band Patient awake    Reviewed: Allergy & Precautions, H&P , NPO status , Patient's Chart, lab work & pertinent test results, reviewed documented beta blocker date and time   Airway Mallampati: II  TM Distance: >3 FB Neck ROM: Limited    Dental  (+) Teeth Intact, Dental Advisory Given, Missing,    Pulmonary asthma , sleep apnea , former smoker,    breath sounds clear to auscultation       Cardiovascular hypertension, Pt. on medications and Pt. on home beta blockers + dysrhythmias  Rhythm:regular Rate:Normal  Echo 07/2018 - Left ventricle: The cavity size was normal. Wall thickness was normal. Systolic function was normal. The estimated ejectionfraction was in the range of 55% to 60%. Wall motion was normal; there were no regional wall motion abnormalities. Doppler parameters are consistent with abnormal left ventricularrelaxation (grade 1 diastolic dysfunction).  - Aortic valve: There was mild regurgitation.  - Pulmonary arteries: Systolic pressure was mildly increased. PA peak pressure: 38 mm Hg (S).    Neuro/Psych Seizures -,     GI/Hepatic GERD  ,  Endo/Other  obese  Renal/GU      Musculoskeletal  (+) Arthritis ,   Abdominal   Peds  Hematology  (+) anemia , JEHOVAH'S WITNESS  Anesthesia Other Findings   Reproductive/Obstetrics                            Anesthesia Physical  Anesthesia Plan  ASA: III  Anesthesia Plan: General   Post-op Pain Management:    Induction: Intravenous  PONV Risk Score and Plan: 3 and Ondansetron, Dexamethasone and Treatment may vary due to age or medical condition  Airway Management Planned: Oral ETT  Additional Equipment: None  Intra-op Plan:   Post-operative Plan: Extubation in OR  Informed Consent: I have reviewed the patients History and Physical, chart, labs and discussed  the procedure including the risks, benefits and alternatives for the proposed anesthesia with the patient or authorized representative who has indicated his/her understanding and acceptance.     Dental advisory given  Plan Discussed with: CRNA  Anesthesia Plan Comments: (See PAT note written 12/18/2019 by Myra Gianotti, PA-C. Jehovah's Witness. CBC WNL. CBG in Holding ordered due to discrepancy on 12/17/19 CBG and serum glucose--no documented history of DM. Saw Dr. Sallyanne Kuster in 07/2018 and had echo and coronary CT. Risk factor modification recommended.    )       Anesthesia Quick Evaluation

## 2019-12-25 ENCOUNTER — Encounter (HOSPITAL_COMMUNITY): Payer: Self-pay | Admitting: Neurosurgery

## 2019-12-25 ENCOUNTER — Other Ambulatory Visit: Payer: Self-pay

## 2019-12-25 ENCOUNTER — Inpatient Hospital Stay (HOSPITAL_COMMUNITY): Payer: Medicare HMO | Admitting: Vascular Surgery

## 2019-12-25 ENCOUNTER — Encounter (HOSPITAL_COMMUNITY)
Admission: RE | Disposition: A | Discharge status: Home / Self Care | Payer: Self-pay | Source: Home / Self Care | Attending: Neurosurgery

## 2019-12-25 ENCOUNTER — Inpatient Hospital Stay (HOSPITAL_COMMUNITY): Payer: Medicare HMO

## 2019-12-25 ENCOUNTER — Inpatient Hospital Stay (HOSPITAL_COMMUNITY)
Admission: RE | Admit: 2019-12-25 | Discharge: 2019-12-27 | DRG: 455 | Disposition: A | Discharge status: Home Health | Payer: Medicare HMO | Attending: Neurosurgery | Admitting: Neurosurgery

## 2019-12-25 DIAGNOSIS — M48062 Spinal stenosis, lumbar region with neurogenic claudication: Secondary | ICD-10-CM | POA: Diagnosis present

## 2019-12-25 DIAGNOSIS — K219 Gastro-esophageal reflux disease without esophagitis: Secondary | ICD-10-CM | POA: Diagnosis present

## 2019-12-25 DIAGNOSIS — Z8249 Family history of ischemic heart disease and other diseases of the circulatory system: Secondary | ICD-10-CM

## 2019-12-25 DIAGNOSIS — Z6834 Body mass index (BMI) 34.0-34.9, adult: Secondary | ICD-10-CM | POA: Diagnosis not present

## 2019-12-25 DIAGNOSIS — M069 Rheumatoid arthritis, unspecified: Secondary | ICD-10-CM | POA: Diagnosis present

## 2019-12-25 DIAGNOSIS — H9190 Unspecified hearing loss, unspecified ear: Secondary | ICD-10-CM | POA: Diagnosis present

## 2019-12-25 DIAGNOSIS — Z9049 Acquired absence of other specified parts of digestive tract: Secondary | ICD-10-CM | POA: Diagnosis not present

## 2019-12-25 DIAGNOSIS — Z87891 Personal history of nicotine dependence: Secondary | ICD-10-CM

## 2019-12-25 DIAGNOSIS — M4326 Fusion of spine, lumbar region: Secondary | ICD-10-CM | POA: Diagnosis not present

## 2019-12-25 DIAGNOSIS — N400 Enlarged prostate without lower urinary tract symptoms: Secondary | ICD-10-CM | POA: Diagnosis present

## 2019-12-25 DIAGNOSIS — G4730 Sleep apnea, unspecified: Secondary | ICD-10-CM | POA: Diagnosis present

## 2019-12-25 DIAGNOSIS — G250 Essential tremor: Secondary | ICD-10-CM | POA: Diagnosis present

## 2019-12-25 DIAGNOSIS — E669 Obesity, unspecified: Secondary | ICD-10-CM | POA: Diagnosis present

## 2019-12-25 DIAGNOSIS — Z20822 Contact with and (suspected) exposure to covid-19: Secondary | ICD-10-CM | POA: Diagnosis present

## 2019-12-25 DIAGNOSIS — Z79899 Other long term (current) drug therapy: Secondary | ICD-10-CM | POA: Diagnosis not present

## 2019-12-25 DIAGNOSIS — Z419 Encounter for procedure for purposes other than remedying health state, unspecified: Secondary | ICD-10-CM

## 2019-12-25 DIAGNOSIS — I10 Essential (primary) hypertension: Secondary | ICD-10-CM | POA: Diagnosis present

## 2019-12-25 DIAGNOSIS — Z981 Arthrodesis status: Secondary | ICD-10-CM | POA: Diagnosis not present

## 2019-12-25 DIAGNOSIS — Z531 Procedure and treatment not carried out because of patient's decision for reasons of belief and group pressure: Secondary | ICD-10-CM | POA: Diagnosis present

## 2019-12-25 DIAGNOSIS — Z7289 Other problems related to lifestyle: Secondary | ICD-10-CM | POA: Diagnosis not present

## 2019-12-25 DIAGNOSIS — M5416 Radiculopathy, lumbar region: Secondary | ICD-10-CM | POA: Diagnosis present

## 2019-12-25 DIAGNOSIS — Z974 Presence of external hearing-aid: Secondary | ICD-10-CM

## 2019-12-25 DIAGNOSIS — Z791 Long term (current) use of non-steroidal anti-inflammatories (NSAID): Secondary | ICD-10-CM | POA: Diagnosis not present

## 2019-12-25 DIAGNOSIS — Z888 Allergy status to other drugs, medicaments and biological substances status: Secondary | ICD-10-CM

## 2019-12-25 DIAGNOSIS — M4316 Spondylolisthesis, lumbar region: Principal | ICD-10-CM

## 2019-12-25 LAB — GLUCOSE, CAPILLARY: Glucose-Capillary: 87 mg/dL (ref 70–99)

## 2019-12-25 SURGERY — POSTERIOR LUMBAR FUSION 1 LEVEL
Anesthesia: General | Site: Back

## 2019-12-25 MED ORDER — METOPROLOL SUCCINATE ER 50 MG PO TB24
50.0000 mg | ORAL_TABLET | Freq: Every day | ORAL | Status: DC
  Administered 2019-12-26 – 2019-12-27 (×2): 50 mg via ORAL
  Filled 2019-12-25 (×2): qty 1

## 2019-12-25 MED ORDER — SODIUM CHLORIDE 0.9 % IV SOLN
INTRAVENOUS | Status: DC | PRN
  Administered 2019-12-25: 500 mL

## 2019-12-25 MED ORDER — CEFAZOLIN SODIUM 1 G IJ SOLR
INTRAMUSCULAR | Status: AC
  Filled 2019-12-25: qty 20

## 2019-12-25 MED ORDER — PANTOPRAZOLE SODIUM 40 MG PO TBEC
40.0000 mg | DELAYED_RELEASE_TABLET | Freq: Every day | ORAL | Status: DC
  Administered 2019-12-25 – 2019-12-26 (×2): 40 mg via ORAL
  Filled 2019-12-25: qty 1

## 2019-12-25 MED ORDER — TAMSULOSIN HCL 0.4 MG PO CAPS
0.4000 mg | ORAL_CAPSULE | Freq: Every day | ORAL | Status: DC
  Administered 2019-12-25 – 2019-12-26 (×2): 0.4 mg via ORAL
  Filled 2019-12-25 (×2): qty 1

## 2019-12-25 MED ORDER — HYDROCHLOROTHIAZIDE 25 MG PO TABS
25.0000 mg | ORAL_TABLET | Freq: Every day | ORAL | Status: DC
  Administered 2019-12-26 – 2019-12-27 (×2): 25 mg via ORAL
  Filled 2019-12-25 (×2): qty 1

## 2019-12-25 MED ORDER — CEFAZOLIN SODIUM-DEXTROSE 2-4 GM/100ML-% IV SOLN
2.0000 g | Freq: Three times a day (TID) | INTRAVENOUS | Status: AC
  Administered 2019-12-25 – 2019-12-27 (×6): 2 g via INTRAVENOUS
  Filled 2019-12-25 (×6): qty 100

## 2019-12-25 MED ORDER — CYCLOBENZAPRINE HCL 10 MG PO TABS
10.0000 mg | ORAL_TABLET | Freq: Three times a day (TID) | ORAL | Status: DC | PRN
  Administered 2019-12-26: 05:00:00 10 mg via ORAL
  Filled 2019-12-25: qty 1

## 2019-12-25 MED ORDER — PHENYLEPHRINE HCL (PRESSORS) 10 MG/ML IV SOLN
INTRAVENOUS | Status: AC
  Filled 2019-12-25: qty 1

## 2019-12-25 MED ORDER — FENTANYL CITRATE (PF) 250 MCG/5ML IJ SOLN
INTRAMUSCULAR | Status: AC
  Filled 2019-12-25: qty 5

## 2019-12-25 MED ORDER — CITALOPRAM HYDROBROMIDE 20 MG PO TABS
20.0000 mg | ORAL_TABLET | Freq: Every day | ORAL | Status: DC
  Administered 2019-12-26 – 2019-12-27 (×2): 20 mg via ORAL
  Filled 2019-12-25 (×2): qty 1

## 2019-12-25 MED ORDER — LIDOCAINE-EPINEPHRINE 1 %-1:100000 IJ SOLN
INTRAMUSCULAR | Status: DC | PRN
  Administered 2019-12-25: 10 mL

## 2019-12-25 MED ORDER — PRIMIDONE 50 MG PO TABS
50.0000 mg | ORAL_TABLET | Freq: Every day | ORAL | Status: DC
  Administered 2019-12-25 – 2019-12-26 (×2): 50 mg via ORAL
  Filled 2019-12-25 (×3): qty 1

## 2019-12-25 MED ORDER — PHENYLEPHRINE HCL-NACL 10-0.9 MG/250ML-% IV SOLN
INTRAVENOUS | Status: DC | PRN
  Administered 2019-12-25: 20 ug/min via INTRAVENOUS

## 2019-12-25 MED ORDER — PRIMIDONE 50 MG PO TABS
100.0000 mg | ORAL_TABLET | Freq: Every day | ORAL | Status: DC
  Administered 2019-12-26 – 2019-12-27 (×2): 100 mg via ORAL
  Filled 2019-12-25 (×2): qty 2

## 2019-12-25 MED ORDER — HYDROMORPHONE HCL 1 MG/ML IJ SOLN
INTRAMUSCULAR | Status: DC | PRN
  Administered 2019-12-25 (×2): .5 mg via INTRAVENOUS

## 2019-12-25 MED ORDER — ACETAMINOPHEN 650 MG RE SUPP: 650.0000 mg | RECTAL | Status: DC | PRN

## 2019-12-25 MED ORDER — OXYCODONE HCL 5 MG PO TABS
10.0000 mg | ORAL_TABLET | ORAL | Status: DC | PRN
  Administered 2019-12-25 – 2019-12-27 (×7): 10 mg via ORAL
  Filled 2019-12-25 (×7): qty 2

## 2019-12-25 MED ORDER — PHENYLEPHRINE 40 MCG/ML (10ML) SYRINGE FOR IV PUSH (FOR BLOOD PRESSURE SUPPORT)
PREFILLED_SYRINGE | INTRAVENOUS | Status: DC | PRN
  Administered 2019-12-25 (×3): 80 ug via INTRAVENOUS

## 2019-12-25 MED ORDER — 0.9 % SODIUM CHLORIDE (POUR BTL) OPTIME
TOPICAL | Status: DC | PRN
  Administered 2019-12-25: 09:00:00 1000 mL

## 2019-12-25 MED ORDER — ONDANSETRON HCL 4 MG PO TABS
4.0000 mg | ORAL_TABLET | Freq: Four times a day (QID) | ORAL | Status: DC | PRN
  Administered 2019-12-27: 4 mg via ORAL
  Filled 2019-12-25: qty 1

## 2019-12-25 MED ORDER — PROPOFOL 10 MG/ML IV BOLUS
INTRAVENOUS | Status: DC | PRN
  Administered 2019-12-25: 100 mg via INTRAVENOUS

## 2019-12-25 MED ORDER — ONDANSETRON HCL 4 MG/2ML IJ SOLN
INTRAMUSCULAR | Status: DC | PRN
  Administered 2019-12-25: 4 mg via INTRAVENOUS

## 2019-12-25 MED ORDER — METHOCARBAMOL 750 MG PO TABS
750.0000 mg | ORAL_TABLET | Freq: Two times a day (BID) | ORAL | Status: DC
  Administered 2019-12-25 – 2019-12-26 (×3): 750 mg via ORAL
  Filled 2019-12-25 (×3): qty 1

## 2019-12-25 MED ORDER — HYDROMORPHONE HCL 1 MG/ML IJ SOLN
0.2500 mg | INTRAMUSCULAR | Status: DC | PRN
  Administered 2019-12-25 (×2): 0.25 mg via INTRAVENOUS

## 2019-12-25 MED ORDER — SODIUM CHLORIDE 0.9% FLUSH
3.0000 mL | Freq: Two times a day (BID) | INTRAVENOUS | Status: DC
  Administered 2019-12-25 – 2019-12-26 (×3): 3 mL via INTRAVENOUS

## 2019-12-25 MED ORDER — THROMBIN 20000 UNITS EX SOLR
CUTANEOUS | Status: AC
  Filled 2019-12-25: qty 20000

## 2019-12-25 MED ORDER — ACETAMINOPHEN 325 MG PO TABS
650.0000 mg | ORAL_TABLET | ORAL | Status: DC | PRN
  Administered 2019-12-26 – 2019-12-27 (×4): 650 mg via ORAL
  Filled 2019-12-25 (×4): qty 2

## 2019-12-25 MED ORDER — MENTHOL 3 MG MT LOZG: 1.0000 | LOZENGE | OROMUCOSAL | Status: DC | PRN

## 2019-12-25 MED ORDER — HYDROMORPHONE HCL 1 MG/ML IJ SOLN
INTRAMUSCULAR | Status: AC
  Filled 2019-12-25: qty 1

## 2019-12-25 MED ORDER — ROCURONIUM BROMIDE 10 MG/ML (PF) SYRINGE
PREFILLED_SYRINGE | INTRAVENOUS | Status: DC | PRN
  Administered 2019-12-25: 60 mg via INTRAVENOUS
  Administered 2019-12-25 (×3): 20 mg via INTRAVENOUS
  Administered 2019-12-25: 10 mg via INTRAVENOUS

## 2019-12-25 MED ORDER — SODIUM CHLORIDE 0.9% FLUSH: 3.0000 mL | INTRAVENOUS | Status: DC | PRN

## 2019-12-25 MED ORDER — PROPOFOL 10 MG/ML IV BOLUS
INTRAVENOUS | Status: AC
  Filled 2019-12-25: qty 20

## 2019-12-25 MED ORDER — MEPERIDINE HCL 25 MG/ML IJ SOLN: 6.2500 mg | INTRAMUSCULAR | Status: DC | PRN

## 2019-12-25 MED ORDER — LIDOCAINE-EPINEPHRINE 1 %-1:100000 IJ SOLN
INTRAMUSCULAR | Status: AC
  Filled 2019-12-25: qty 1

## 2019-12-25 MED ORDER — EPHEDRINE SULFATE 50 MG/ML IJ SOLN
INTRAMUSCULAR | Status: DC | PRN
  Administered 2019-12-25: 5 mg via INTRAVENOUS

## 2019-12-25 MED ORDER — BUPIVACAINE LIPOSOME 1.3 % IJ SUSP
20.0000 mL | Freq: Once | INTRAMUSCULAR | Status: DC
  Filled 2019-12-25: qty 20

## 2019-12-25 MED ORDER — HYDROMORPHONE HCL 1 MG/ML IJ SOLN
1.0000 mg | INTRAMUSCULAR | Status: DC | PRN
  Administered 2019-12-25: 1 mg via INTRAVENOUS
  Filled 2019-12-25: qty 1

## 2019-12-25 MED ORDER — LIDOCAINE 2% (20 MG/ML) 5 ML SYRINGE
INTRAMUSCULAR | Status: DC | PRN
  Administered 2019-12-25: 40 mg via INTRAVENOUS

## 2019-12-25 MED ORDER — HYDROMORPHONE HCL 1 MG/ML IJ SOLN
INTRAMUSCULAR | Status: AC
  Filled 2019-12-25: qty 0.5

## 2019-12-25 MED ORDER — FENTANYL CITRATE (PF) 250 MCG/5ML IJ SOLN
INTRAMUSCULAR | Status: DC | PRN
  Administered 2019-12-25 (×5): 50 ug via INTRAVENOUS

## 2019-12-25 MED ORDER — ROCURONIUM BROMIDE 10 MG/ML (PF) SYRINGE
PREFILLED_SYRINGE | INTRAVENOUS | Status: AC
  Filled 2019-12-25: qty 10

## 2019-12-25 MED ORDER — CEFAZOLIN SODIUM-DEXTROSE 2-3 GM-%(50ML) IV SOLR
INTRAVENOUS | Status: DC | PRN
  Administered 2019-12-25: 2 g via INTRAVENOUS

## 2019-12-25 MED ORDER — ONDANSETRON HCL 4 MG/2ML IJ SOLN
4.0000 mg | Freq: Four times a day (QID) | INTRAMUSCULAR | Status: DC | PRN
  Administered 2019-12-26: 4 mg via INTRAVENOUS
  Filled 2019-12-25: qty 2

## 2019-12-25 MED ORDER — ATORVASTATIN CALCIUM 10 MG PO TABS
10.0000 mg | ORAL_TABLET | Freq: Every evening | ORAL | Status: DC
  Administered 2019-12-25 – 2019-12-26 (×2): 10 mg via ORAL
  Filled 2019-12-25 (×2): qty 1

## 2019-12-25 MED ORDER — SODIUM CHLORIDE 0.9 % IV SOLN
250.0000 mL | INTRAVENOUS | Status: DC
  Administered 2019-12-25 – 2019-12-26 (×2): 250 mL via INTRAVENOUS

## 2019-12-25 MED ORDER — PHENOL 1.4 % MT LIQD: 1.0000 | OROMUCOSAL | Status: DC | PRN

## 2019-12-25 MED ORDER — PANTOPRAZOLE SODIUM 40 MG IV SOLR: 40.0000 mg | Freq: Every day | INTRAVENOUS | Status: DC

## 2019-12-25 MED ORDER — SUGAMMADEX SODIUM 200 MG/2ML IV SOLN
INTRAVENOUS | Status: DC | PRN
  Administered 2019-12-25: 200 mg via INTRAVENOUS

## 2019-12-25 MED ORDER — BUPIVACAINE LIPOSOME 1.3 % IJ SUSP
INTRAMUSCULAR | Status: DC | PRN
  Administered 2019-12-25: 20 mL

## 2019-12-25 MED ORDER — LACTATED RINGERS IV SOLN: INTRAVENOUS | Status: DC | PRN

## 2019-12-25 MED ORDER — LIDOCAINE 2% (20 MG/ML) 5 ML SYRINGE
INTRAMUSCULAR | Status: AC
  Filled 2019-12-25: qty 5

## 2019-12-25 MED ORDER — THROMBIN 20000 UNITS EX SOLR
CUTANEOUS | Status: DC | PRN
  Administered 2019-12-25: 09:00:00 20 mL via TOPICAL

## 2019-12-25 MED ORDER — ONDANSETRON HCL 4 MG/2ML IJ SOLN: 4.0000 mg | Freq: Once | INTRAMUSCULAR | Status: DC | PRN

## 2019-12-25 MED ORDER — ALUM & MAG HYDROXIDE-SIMETH 200-200-20 MG/5ML PO SUSP
30.0000 mL | Freq: Four times a day (QID) | ORAL | Status: DC | PRN
  Administered 2019-12-26: 13:00:00 30 mL via ORAL
  Filled 2019-12-25 (×2): qty 30

## 2019-12-25 SURGICAL SUPPLY — 80 items
APL SKNCLS STERI-STRIP NONHPOA (GAUZE/BANDAGES/DRESSINGS) ×1
BAG DECANTER FOR FLEXI CONT (MISCELLANEOUS) ×2 IMPLANT
BENZOIN TINCTURE PRP APPL 2/3 (GAUZE/BANDAGES/DRESSINGS) ×2 IMPLANT
BIT DRILL 5.0/4.0 (BIT) ×1 IMPLANT
BLADE CLIPPER SURG (BLADE) ×2 IMPLANT
BLADE SURG 11 STRL SS (BLADE) ×2 IMPLANT
BUR CUTTER 7.0 ROUND (BURR) ×2 IMPLANT
BUR MATCHSTICK NEURO 3.0 LAGG (BURR) ×2 IMPLANT
CANISTER SUCT 3000ML PPV (MISCELLANEOUS) ×2 IMPLANT
CAP LOCKING (Cap) ×6 IMPLANT
CAP LOCKING 5.5 CREO (Cap) ×6 IMPLANT
CARTRIDGE OIL MAESTRO DRILL (MISCELLANEOUS) ×1 IMPLANT
CNTNR URN SCR LID CUP LEK RST (MISCELLANEOUS) ×1 IMPLANT
CONT SPEC 4OZ STRL OR WHT (MISCELLANEOUS) ×2
COVER BACK TABLE 60X90IN (DRAPES) ×2 IMPLANT
COVER WAND RF STERILE (DRAPES) IMPLANT
DECANTER SPIKE VIAL GLASS SM (MISCELLANEOUS) ×2 IMPLANT
DERMABOND ADVANCED (GAUZE/BANDAGES/DRESSINGS) ×1
DERMABOND ADVANCED .7 DNX12 (GAUZE/BANDAGES/DRESSINGS) ×1 IMPLANT
DIFFUSER DRILL AIR PNEUMATIC (MISCELLANEOUS) ×2 IMPLANT
DRAPE C-ARM 42X72 X-RAY (DRAPES) ×2 IMPLANT
DRAPE C-ARMOR (DRAPES) ×2 IMPLANT
DRAPE HALF SHEET 40X57 (DRAPES) IMPLANT
DRAPE LAPAROTOMY 100X72X124 (DRAPES) ×2 IMPLANT
DRAPE SURG 17X23 STRL (DRAPES) ×2 IMPLANT
DRILL 5.0/4.0 (BIT) ×2
DRSG OPSITE 4X5.5 SM (GAUZE/BANDAGES/DRESSINGS) IMPLANT
DRSG OPSITE POSTOP 4X6 (GAUZE/BANDAGES/DRESSINGS) IMPLANT
DRSG OPSITE POSTOP 4X8 (GAUZE/BANDAGES/DRESSINGS) ×2 IMPLANT
DURAPREP 26ML APPLICATOR (WOUND CARE) ×2 IMPLANT
ELECT REM PT RETURN 9FT ADLT (ELECTROSURGICAL) ×2
ELECTRODE REM PT RTRN 9FT ADLT (ELECTROSURGICAL) ×1 IMPLANT
EVACUATOR 3/16  PVC DRAIN (DRAIN) ×1
EVACUATOR 3/16 PVC DRAIN (DRAIN) ×1 IMPLANT
GAUZE 4X4 16PLY RFD (DISPOSABLE) ×2 IMPLANT
GAUZE SPONGE 4X4 12PLY STRL (GAUZE/BANDAGES/DRESSINGS) ×2 IMPLANT
GLOVE BIO SURGEON STRL SZ7 (GLOVE) IMPLANT
GLOVE BIO SURGEON STRL SZ8 (GLOVE) ×4 IMPLANT
GLOVE BIOGEL PI IND STRL 7.0 (GLOVE) IMPLANT
GLOVE BIOGEL PI INDICATOR 7.0 (GLOVE)
GLOVE EXAM NITRILE XL STR (GLOVE) IMPLANT
GLOVE INDICATOR 8.5 STRL (GLOVE) ×4 IMPLANT
GOWN STRL REUS W/ TWL LRG LVL3 (GOWN DISPOSABLE) IMPLANT
GOWN STRL REUS W/ TWL XL LVL3 (GOWN DISPOSABLE) ×2 IMPLANT
GOWN STRL REUS W/TWL 2XL LVL3 (GOWN DISPOSABLE) IMPLANT
GOWN STRL REUS W/TWL LRG LVL3 (GOWN DISPOSABLE)
GOWN STRL REUS W/TWL XL LVL3 (GOWN DISPOSABLE) ×2
HEMOSTAT POWDER KIT SURGIFOAM (HEMOSTASIS) IMPLANT
IMPL SPINE MCS 6.5 5.5X35 (Neuro Prosthesis/Implant) ×2 IMPLANT
IMPLANT SPINE MCS 6.5 5.5X35 (Neuro Prosthesis/Implant) ×4 IMPLANT
KIT BASIN OR (CUSTOM PROCEDURE TRAY) ×2 IMPLANT
KIT TURNOVER KIT B (KITS) ×2 IMPLANT
MILL MEDIUM DISP (BLADE) ×2 IMPLANT
NEEDLE HYPO 25X1 1.5 SAFETY (NEEDLE) ×2 IMPLANT
NS IRRIG 1000ML POUR BTL (IV SOLUTION) ×2 IMPLANT
OIL CARTRIDGE MAESTRO DRILL (MISCELLANEOUS) ×2
PACK LAMINECTOMY NEURO (CUSTOM PROCEDURE TRAY) ×2 IMPLANT
PAD ARMBOARD 7.5X6 YLW CONV (MISCELLANEOUS) ×6 IMPLANT
ROD 55MM (Rod) ×1 IMPLANT
ROD 65MM SPINAL (Rod) ×1 IMPLANT
ROD SPNL 5.5 CREO TI 65 (Rod) ×1 IMPLANT
ROD SPNL CVD 55X5.5XNS TI (Rod) ×1 IMPLANT
SCREW MOD 6.0-5.0X35MM (Screw) ×4 IMPLANT
SHAFT CREO 30MM (Neuro Prosthesis/Implant) ×4 IMPLANT
SPACER SUSTAIN RT TI 8X22X11 8 (Spacer) ×4 IMPLANT
SPONGE LAP 4X18 RFD (DISPOSABLE) IMPLANT
SPONGE SURGIFOAM ABS GEL 100 (HEMOSTASIS) ×2 IMPLANT
STRIP BIOACTIVE 10CC 25X100X4 (Miscellaneous) ×2 IMPLANT
STRIP CLOSURE SKIN 1/2X4 (GAUZE/BANDAGES/DRESSINGS) ×2 IMPLANT
SUT BONE WAX W31G (SUTURE) ×2 IMPLANT
SUT VIC AB 0 CT1 18XCR BRD8 (SUTURE) ×3 IMPLANT
SUT VIC AB 0 CT1 8-18 (SUTURE) ×6
SUT VIC AB 2-0 CP2 18 (SUTURE) ×2 IMPLANT
SUT VIC AB 2-0 CT1 18 (SUTURE) ×4 IMPLANT
SUT VIC AB 4-0 PS2 27 (SUTURE) ×2 IMPLANT
TOWEL GREEN STERILE (TOWEL DISPOSABLE) ×2 IMPLANT
TOWEL GREEN STERILE FF (TOWEL DISPOSABLE) ×2 IMPLANT
TRAY FOLEY MTR SLVR 16FR STAT (SET/KITS/TRAYS/PACK) ×2 IMPLANT
TULIP CREP AMP 5.5MM (Orthopedic Implant) ×12 IMPLANT
WATER STERILE IRR 1000ML POUR (IV SOLUTION) ×2 IMPLANT

## 2019-12-25 NOTE — Transfer of Care (Signed)
Immediate Anesthesia Transfer of Care Note  Patient: RAKEIM WARHURST  Procedure(s) Performed: Posterior Lumbar Interbody Fusion - Lumbar Four-Lumbar Five (N/A Back)  Patient Location: PACU  Anesthesia Type:General  Level of Consciousness: awake, alert  and oriented  Airway & Oxygen Therapy: Patient Spontanous Breathing and Patient connected to face mask oxygen  Post-op Assessment: Report given to RN, Post -op Vital signs reviewed and stable and Patient moving all extremities  Post vital signs: Reviewed and stable  Last Vitals:  Vitals Value Taken Time  BP 146/70 12/25/19 1228  Temp    Pulse 54 12/25/19 1231  Resp 18 12/25/19 1231  SpO2 100 % 12/25/19 1231  Vitals shown include unvalidated device data.  Last Pain:  Vitals:   12/25/19 0645  TempSrc:   PainSc: 0-No pain      Patients Stated Pain Goal: 3 ([id number])  Complications: No apparent anesthesia complications

## 2019-12-25 NOTE — H&P (Signed)
Bryan [name] is an 74 y.o. male.   Chief Complaint: Back and bilateral leg pain HPI: 74 year old gentleman with progressive worsening back pain bilateral leg pain and neurogenic claudication.  Patient failed all forms conservative treatment work-up revealed severe spinal stenosis at L4-5.  Due to patient progression of clinical syndrome imaging findings and failed conservative treatment I recommended decompressive laminectomy and interbody fusion at L4-5 I extensively reviewed the risks and benefits of that operation with him as well as perioperative course expectations of outcome and alternatives of surgery and he understood and agreed to proceed forward.  Past Medical History:  Diagnosis Date  . Anemia   . Arthritis    hands, knees, cervical area. Back pain. Rheumatoid arthritis- weekly injections.  [name] BPH (benign prostatic hypertrophy)   . Cancer ([place])   . Essential tremor   . GERD (gastroesophageal reflux disease)    reports for indigestion he uses mustard   . Hearing deficit    wears hearing aids bilateral  . HTN (hypertension)   . Obesity   . Refusal of blood transfusions as patient is Jehovah's Witness   . Right bundle branch block    history of  . Seizures ([facility])    AS A CHILD. only esential tremors now.  . Sleep apnea    cpap - settings at 9 per patient   . Ulcer     Past Surgical History:  Procedure Laterality Date  . ANTERIOR CERVICAL DECOMP/DISCECTOMY FUSION N/A 03/30/2013   Procedure: ANTERIOR CERVICAL DECOMPRESSION/DISCECTOMY FUSION 2 LEVELS;  Surgeon: Otilio Connors, MD;  Location: [facility];  Service: Neurosurgery;  Laterality: N/A;  C4-5 C5-6 Anterior cervical decompression/diskectomy/fusion/Allograft/Plate  . CHOLECYSTECTOMY N/A 12/07/2013   Procedure: LAPAROSCOPIC CHOLECYSTECTOMY WITH INTRAOPERATIVE CHOLANGIOGRAM;  Surgeon: Adin Hector, MD;  Location: [facility];  Service: General;  Laterality: N/A;  . COLONOSCOPY    . DECOMPRESSIVE LUMBAR LAMINECTOMY LEVEL 2 N/A  02/15/2015   Procedure: COMPLETE DECOMPRESSIVE LUMBAR LAMINECTOMY L4-L5/ FORAMINOTOMY TO L4 NERVE ROOT AND L5 NERVE ROOT BILATERALLY;  Surgeon: [name], MD;  Location: [facility];  Service: Orthopedics;  Laterality: N/A;  . EYE SURGERY     right, growth excision  . LUMBAR LAMINECTOMY/DECOMPRESSION MICRODISCECTOMY Left 01/18/2016   Procedure:  DECOMPRESSION L4-L5 MICRODISCECTOMY L4-L5 ON LEFT FOR SPINAL STENOSIS;  Surgeon: [name], MD;  Location: [facility];  Service: Orthopedics;  Laterality: Left;  . PAROTIDECTOMY Left 01/24/2018   Procedure: INCISIONAL BIOPSY OF LEFT PAROTID    MASS;  Surgeon: Helayne Seminole, MD;  Location: [facility];  Service: ENT;  Laterality: Left;  . SPINE SURGERY    . TONSILLECTOMY    . VASECTOMY    . WRIST GANGLION EXCISION Left     Family History  Problem Relation Age of Onset  . Colon cancer Maternal Grandfather   . Kidney disease Brother   . Hypertension Brother   . Diabetes Brother   . Stroke Maternal Grandmother   . Diabetes Mother   . Hypertension Mother   . CAD Father        died of MI at age 28  . Hypertension Father   . Diabetes Paternal Aunt        x 4 aunts   Social History:  reports that he quit smoking about 47 years ago. His smoking use included cigarettes. He has never used smokeless tobacco. He reports current alcohol use. He reports that he does not use drugs.  Allergies:  Allergies  Allergen Reactions  . Lisinopril  Other (See Comments) and Cough    Cough  . Other Other (See Comments)    BLOOD PRODUCT REFUSAL (patient is a Jehovah's Witness)     Medications Prior to Admission  Medication Sig Dispense Refill  . atorvastatin (LIPITOR) 10 MG tablet Take 10 mg by mouth every evening.     . citalopram (CELEXA) 20 MG tablet Take 20 mg by mouth daily.    . hydrochlorothiazide (HYDRODIURIL) 25 MG tablet Take 1 tablet (25 mg total) by mouth daily. 90 tablet 0  . meloxicam (MOBIC) 7.5 MG tablet Take 7.5 mg by mouth daily.    .  methocarbamol (ROBAXIN) 750 MG tablet Take 750 mg by mouth 2 (two) times daily.     . metoprolol succinate (TOPROL-XL) 50 MG 24 hr tablet TAKE 1 TABLET BY MOUTH ONCE DAILY WITH  OR  IMMEDIATELY  FOLLOWING  A  MEAL 90 tablet 0  . primidone (MYSOLINE) 50 MG tablet Take 50-100 mg by mouth See admin instructions. Take 2 tablets (100 mg) by mouth in the morning & take 1 tablet (50 mg) by mouth in the evening    . Tamsulosin HCl (FLOMAX) 0.4 MG CAPS Take 0.5 mg by mouth at bedtime.       No results found for this or any previous visit (from the past 48 hour(s)). No results found.  Review of Systems  Musculoskeletal: Positive for back pain.  Neurological: Positive for weakness and numbness.    Blood pressure 133/70, pulse 70, temperature 98.9 F (37.2 C), temperature source Oral, resp. rate 17, height 5\' 6"  (1.676 m), weight 98 kg, SpO2 96 %. Physical Exam  Constitutional: He is oriented to person, place, and time. He appears well-developed.  HENT:  Head: Normocephalic.  Eyes: Pupils are equal, round, and reactive to light.  Cardiovascular: Normal rate.  Respiratory: Effort normal.  GI: Soft. Bowel sounds are normal.  Musculoskeletal:        General: Normal range of motion.     Cervical back: Normal range of motion.  Neurological: He is alert and oriented to person, place, and time. He has normal strength. GCS eye subscore is 4. GCS verbal subscore is 5. GCS motor subscore is 6.  Strength is 5 out of 5 iliopsoas, quads, hamstrings, gastrocs, into tibialis, and EHL.     Assessment/Plan 74 years old presents for posterior lumbar interbody fusion L4-5  Bryan [name] P, MD 12/25/2019, 7:27 AM

## 2019-12-25 NOTE — Op Note (Signed)
Preoperative diagnosis: Severe lumbar spinal stenosis L4-5 with grade 1 spondylolisthesis bilateral L5 radiculopathies  Postoperative diagnosis Same with instability L4-5 and L5-S1  Procedure: #1 redo decompressive lumbar laminectomy with complete mental facetectomies of L4-5 and radical foraminotomies of the L4 and L5 nerve roots in excess and requiring more work than would be needed with a standard interbody fusion  2.  Posterior lumbar interbody fusion L4-5 utilizing the globus insert and rotate titanium cages packed with locally harvested autograft mixed with DBX mix  3.  Cortical screw fixation L4-S1 utilizing the globus Creo modular and cortical screw set  4.  Posterior lateral arthrodesis L4-S1 utilizing locally harvested autograft mixed  Surgeon: Dominica Severin Paymon Rosensteel  Assistant: Nash Shearer  Anesthesia: General  EBL: Minimal  HPI: 74 year old gentleman with longstanding back bilateral hip and leg pain previously undergone a laminectomy and onlay in situ fusion at L4-5 however he developed progressive worsening L4 and L5 nerve root symptoms work-up with CT myelography showed a complete block at L4-5 with grade 1 spondylolisthesis marked foraminal stenosis.  Due to patient's progression of clinical syndrome imaging findings and failed conservative treatment I recommended redo decompressive laminectomy and interbody fusion at L4-5.  I extensively went over the risks and benefits of the operation with him as well as perioperative course expectations of outcome and alternatives of surgery and he understood and agreed to proceed forward.  Operative procedure: Patient was brought into the OR was induced under general anesthesia positioned prone the Wilson frame his back was prepped and draped in routine sterile fashion.  His old incision was ellipticized and the scar tissue was dissected free and I exposed the spinous processes and scar tissue from what I believed to be L3 down to L5.  There was  extensive mount of scar tissue as well as dystrophic and hypertrophied bone overlaying all of his previous decompression.  I exposed the facet joints and lateral pars from L3 down to L5 and S1 confirmed with intraoperative x-ray the I did the L4 pedicle.  However then working through the scar tissue did try to define normal anatomy drilled into each facet joint at L4-5 and there was such an extensive scar tissue and such thickened hypertrophied bone that the L4 facet was partially fused L3 and extended into the top of L5 status post drilling off the inferior aspect of the 4 5 facet there was noted be incompetence of the 5 1 facet and so during the decompression the 5 1 facet of the right became incompetent.  Identifying and decompressing the lateral thecal sac at the level of the L5 nerve root marching superiorly further freed up the scar tissue remove the facet joint at 4 5 and tract out the L4 nerve root.  Then working on the contralateral side and left side was able to go from normal to abnormal anatomy and free of all scar tissue and perform a complete facetectomy at L4-5 in the left with radical foraminotomies of the L4 and L5 nerve root.  After the disc base at L4-5 was identified and coagulated entered and cleaned it out with sequential distraction I reduce the deformity and with 9 distractor in place I selected 8-11 insert and rotate titanium peek cages packed with locally harvested autograft mixed with DBX mix and inserted this under fluoroscopy.  Packed extensive mount of the autograft mix centrally and inserted the contralateral cage.  This significantly opened up the foramina opened up the disc base and decompressed the L4 nerve root.  Then  due to the incompetence of the 5 1 facet I felt that this was now a unstable level as well so I elected to place screws from L4 down to S1.  So placed cortical screws at L4-L5 and S1 bilaterally under fluoroscopy.  All screws had excellent purchase postop fluoroscopy  confirmed good position of all the implants I then aggressively decorticated the facet joint on the left at L5-S1 in the lateral facet TP and ala on the right.  Repacked the remainder of the autograft mix posterior laterally from L4-S1 then anchored the rod in place tightened everything down compressed L4 against L5 the wounds and copiously irrigated all the foramina reinspected to confirm patency and no migration of graft material Gelfoam was awake top of dura large Hemovac drain was placed and the wound was closed in layers with interrupted Vicryl skin was closed running 4 subcuticular Dermabond benzoin Steri-Strips and a sterile dressing was applied patient recovery in stable condition.  At the end the case all needle count sponge counts were correct.

## 2019-12-25 NOTE — Anesthesia Procedure Notes (Signed)
Procedure Name: Intubation Date/Time: 12/25/2019 7:57 AM Performed by: Amadeo Garnet, CRNA Pre-anesthesia Checklist: Patient identified, Emergency Drugs available, Suction available and Patient being monitored Patient Re-evaluated:Patient Re-evaluated prior to induction Oxygen Delivery Method: Circle system utilized Preoxygenation: Pre-oxygenation with 100% oxygen Induction Type: IV induction Ventilation: Mask ventilation without difficulty Laryngoscope Size: Mac and 4 Grade View: Grade I Tube type: Oral Tube size: 7.5 mm Number of attempts: 1 Airway Equipment and Method: Stylet Placement Confirmation: ETT inserted through vocal cords under direct vision,  positive ETCO2 and breath sounds checked- equal and bilateral Secured at: 22 cm Tube secured with: Tape Dental Injury: Teeth and Oropharynx as per pre-operative assessment

## 2019-12-26 LAB — CBC
HCT: 29.6 % — ABNORMAL LOW (ref 39.0–52.0)
Hemoglobin: 9.5 g/dL — ABNORMAL LOW (ref 13.0–17.0)
MCH: 30.9 pg (ref 26.0–34.0)
MCHC: 32.1 g/dL (ref 30.0–36.0)
MCV: 96.4 fL (ref 80.0–100.0)
Platelets: 151 10*3/uL (ref 150–400)
RBC: 3.07 MIL/uL — ABNORMAL LOW (ref 4.22–5.81)
RDW: 13.4 % (ref 11.5–15.5)
WBC: 8 10*3/uL (ref 4.0–10.5)
nRBC: 0 % (ref 0.0–0.2)

## 2019-12-26 MED ORDER — FERROUS SULFATE 325 (65 FE) MG PO TABS
325.0000 mg | ORAL_TABLET | Freq: Two times a day (BID) | ORAL | Status: DC
  Administered 2019-12-26 – 2019-12-27 (×2): 325 mg via ORAL
  Filled 2019-12-26 (×2): qty 1

## 2019-12-26 MED ORDER — FAMOTIDINE 20 MG PO TABS: 10.0000 mg | ORAL_TABLET | Freq: Two times a day (BID) | ORAL | Status: DC

## 2019-12-26 MED ORDER — CALCIUM CARBONATE ANTACID 500 MG PO CHEW
400.0000 mg | CHEWABLE_TABLET | Freq: Two times a day (BID) | ORAL | Status: DC
  Administered 2019-12-26 – 2019-12-27 (×2): 400 mg via ORAL
  Filled 2019-12-26 (×2): qty 2

## 2019-12-26 MED ORDER — SIMETHICONE 80 MG PO CHEW: 80.0000 mg | CHEWABLE_TABLET | Freq: Four times a day (QID) | ORAL | Status: DC | PRN

## 2019-12-26 NOTE — Evaluation (Signed)
Physical Therapy Evaluation Patient Details Name: Bryan [name] MRN: [id number] DOB: [birth year] Today's Date: 12/26/2019   History of Present Illness  74 yo admitted for L4-S1 PLIF due to back, hip and leg pain. PMhx: L4-5 lami, ACDF, HTN, OA, essential tremor, BPH, GERD, obesity  Clinical Impression  Pt pleasant despite power being out in room. Pt educated for all back precautions, brace wear, transfers, gait, stairs and walking program. Pt with decreased transfers, gait and mobility who will benefit from acute therapy to maximize mobility, safety and gait to increase independence. Handout provided    Follow Up Recommendations Supervision for mobility/OOB    Equipment Recommendations  Rolling walker with 5" wheels;3in1 (PT)    Recommendations for Other Services       Precautions / Restrictions Precautions Precautions: Back;Fall Precaution Booklet Issued: Yes (comment)  Required Braces or Orthoses: Spinal Brace Spinal Brace: Lumbar corset;Other (comment)  Restrictions Weight Bearing Restrictions: No      Mobility  Bed Mobility Overal bed mobility: Needs Assistance Bed Mobility: Rolling;Sidelying to Sit Rolling: Supervision Sidelying to sit: Supervision Supine to sit: Supervision     General bed mobility comments: cues for sequence and precautions  Transfers Overall transfer level: Needs assistance Equipment used: Rolling walker (2 wheeled) Transfers: Sit to/from Stand Sit to Stand: Min guard         General transfer comment: cues for hand placement  Ambulation/Gait Ambulation/Gait assistance: Min guard Gait Distance (Feet): 300 Feet Assistive device: Rolling walker (2 wheeled) Gait Pattern/deviations: Step-through pattern;Decreased stride length   Gait velocity interpretation: >2.62 ft/sec, indicative of community ambulatory General Gait Details: cues for proximity to RW.Pt unwilling to attempt gait without support of RW this session  Stairs Stairs:  Yes Stairs assistance: Min guard Stair Management: Backwards;With walker Number of Stairs: 1 General stair comments: cues for posture and safety  Wheelchair Mobility    Modified Rankin (Stroke Patients Only)       Balance Overall balance assessment: Mild deficits observed, not formally tested                                           Pertinent Vitals/Pain Pain Assessment: 0-10 Faces Pain Scale: Hurts a little bit Pain Location: low back Pain Descriptors / Indicators: Discomfort;Sore Pain Intervention(s): Limited activity within patient's tolerance;Premedicated before session;Repositioned;Monitored during session    [place] expects to be discharged to:: Private residence Living Arrangements: Spouse/significant other Available Help at Discharge: Family;Available 24 hours/day Type of Home: House Home Access: Stairs to enter   [employer] of Steps: 1 Home Layout: One level Home Equipment: None      Prior Function Level of Independence: Independent               Hand Dominance   Dominant Hand: Right    Extremity/Trunk Assessment   Upper Extremity Assessment Upper Extremity Assessment: Overall WFL for tasks assessed    Lower Extremity Assessment Lower Extremity Assessment: Overall WFL for tasks assessed    Cervical / Trunk Assessment Cervical / Trunk Assessment: Other exceptions Cervical / Trunk Exceptions: s/p lumbar sx  Communication   Communication: HOH  Cognition Arousal/Alertness: Awake/alert Behavior During Therapy: WFL for tasks assessed/performed Overall Cognitive Status: Within Functional Limits for tasks assessed  General Comments: Pt having to have directions repeated and assist to follow commands as pt would drift off to sleep      General Comments      Exercises     Assessment/Plan    PT Assessment Patient needs continued PT services  PT  Problem List Decreased mobility;Decreased activity tolerance;Decreased balance;Decreased knowledge of use of DME;Decreased knowledge of precautions       PT Treatment Interventions DME instruction;Therapeutic exercise;Gait training;Balance training;Functional mobility training;Therapeutic activities;Patient/family education    PT Goals (Current goals can be found in the Care Plan section)  Acute Rehab PT Goals Patient Stated Goal: return home PT Goal Formulation: With patient Time For Goal Achievement: 01/09/20 Potential to Achieve Goals: Good    Frequency Min 5X/week   Barriers to discharge        Co-evaluation               AM-PAC PT "6 Clicks" Mobility  Outcome Measure Help needed turning from your back to your side while in a flat bed without using bedrails?: A Little Help needed moving from lying on your back to sitting on the side of a flat bed without using bedrails?: A Little Help needed moving to and from a bed to a chair (including a wheelchair)?: A Little Help needed standing up from a chair using your arms (e.g., wheelchair or bedside chair)?: A Little Help needed to walk in hospital room?: A Little Help needed climbing 3-5 steps with a railing? : A Little 6 Click Score: 18    End of Session Equipment Utilized During Treatment: Back brace Activity Tolerance: Patient tolerated treatment well Patient left: in chair;with call bell/phone within reach Nurse Communication: Mobility status PT Visit Diagnosis: Other abnormalities of gait and mobility (R26.89);Muscle weakness (generalized) (M62.81)    Time: [id number] PT Time Calculation (min) (ACUTE ONLY): 21 min   Charges:   PT Evaluation $PT Eval Moderate Complexity: 1 Mod          Amylee Lodato P, PT Acute Rehabilitation Services Pager: [phone] Office: [phone]   Sandy Salaam Dani Wallner 12/26/2019, 12:36 PM

## 2019-12-26 NOTE — Progress Notes (Signed)
   Providing Compassionate, Quality Care - Together   Subjective: Patient reports expected post-op pain.  Objective: Vital signs in last 24 hours: Temp:  [97.7 F (36.5 C)-99 F (37.2 C)] 99 F (37.2 C) (02/13 0723) Pulse Rate:  [58-73] 70 (02/13 0723) Resp:  [14-20] 16 (02/13 0723) BP: (112-164)/(51-78) 112/51 (02/13 0723) SpO2:  [93 %-100 %] 100 % (02/13 0723)  Intake/Output from previous day: 02/12 0701 - 02/13 0700 In: 2310 [P.O.:100; I.V.:2200; IV Piggyback:10] Out: 2580 [Urine:900; Drains:580; Blood:1100] Intake/Output this shift: No intake/output data recorded.  Alert and oriented x 4 PERRLA CN II-XII grossly intact MAE, Strength and sensation intact, Slightly weaker on RLE Incision is covered with Honeycomb dressing and Steri Strips; Dressing is clean, dry, and intact Hemovac with 280 mL of sanguinous drainage out overnight, 250 mL out on day shift yesterday   Lab Results: No results for input(s): WBC, HGB, HCT, PLT in the last 72 hours. BMET No results for input(s): NA, K, CL, CO2, GLUCOSE, BUN, CREATININE, CALCIUM in the last 72 hours.  Studies/Results: DG Lumbar Spine 2-3 Views  Result Date: 12/25/2019 CLINICAL DATA:  Posterior lumbar interbody fusion EXAM: DG C-ARM 1-60 MIN; LUMBAR SPINE - 2-3 VIEW FLUOROSCOPY TIME:  Fluoroscopy Time:  14 seconds COMPARISON:  None. FINDINGS: Multiple intraoperative fluoroscopic spot images are provided. Posterior lumbar interbody fusion from L4 through S1. IMPRESSION: Intraoperative localization. Electronically Signed   By: Kathreen Devoid   On: 12/25/2019 12:13   DG C-Arm 1-60 Min  Result Date: 12/25/2019 CLINICAL DATA:  Posterior lumbar interbody fusion EXAM: DG C-ARM 1-60 MIN; LUMBAR SPINE - 2-3 VIEW FLUOROSCOPY TIME:  Fluoroscopy Time:  14 seconds COMPARISON:  None. FINDINGS: Multiple intraoperative fluoroscopic spot images are provided. Posterior lumbar interbody fusion from L4 through S1. IMPRESSION: Intraoperative  localization. Electronically Signed   By: Kathreen Devoid   On: 12/25/2019 12:13   DG MINI C-ARM IMAGE ONLY  Result Date: 12/25/2019 There is no interpretation for this exam.  This order is for images obtained during a surgical procedure.  Please See "Surgeries" Tab for more information regarding the procedure.    Assessment/Plan: Patient is one day status post L4-5 PLIF by Dr. Saintclair Halsted. His drain is continuing to put out quite a bit of sanguinous drainage. He has mobilized with OT this morning and they are recommending home health OT as of now.   LOS: 1 day    -Leave Hemovac in. Will look into removing it tomorrow and possibly discharging home -[place], DNP, AGNP-C Nurse Practitioner  [facility] [place] [address], [place], [place] [address] P: [phone]    F: [phone]  12/26/2019, 10:03 AM

## 2019-12-26 NOTE — Evaluation (Addendum)
Occupational Therapy Evaluation Patient Details Name: Bryan [name] MRN: [id number] DOB: May 11, 1946 Today's Date: 12/26/2019    History of Present Illness 74 yo admitted for L4-S1 PLIF due to back, hip and leg pain. PMhx: L4-5 lami, ACDF, HTN, OA, essential tremor, BPH, GERD, obesity   Clinical Impression   Pt PTA: Pt was living with spouse and reports independence with ADL and mobility with AD. Pt currently minA overall for ADL with adaptive equipment for lower body ADL. Pt education on AE for LB tasks - donning socks and boxers. Pt performing tasks abiding by back precautions. Pt very lethargic and required directions loud and repetitive. Pt very HOH. Pt ambulating with minguardA. Pt practicing tub transfer with grab bars as door walls with minguardA. Pt's RLE appears to have decreased strength compared to LLE. Pain in back reported and grimacing with initial movements.   Back handout provided and reviewed adls in detail. Pt educated on: clothing between brace, never sleep in brace, set an alarm at night for medication, avoid sitting for long periods of time, correct bed positioning for sleeping, correct sequence for bed mobility, avoiding lifting more than 5 pounds and never wash directly over incision. All education is complete and patient indicates understanding. Pt would benefit from additional acute OT visit as pt was lethargic for carry over skills for AE education. OT following acutely.       Follow Up Recommendations  Home health OT;Supervision/Assistance - 24 hour(initially)    Equipment Recommendations  3 in 1 bedside commode    Recommendations for Other Services       Precautions / Restrictions Precautions Precautions: Back;Fall Precaution Booklet Issued: Yes (comment) Precaution Comments: verbal discussion of back precautions; able to state 2/3 stating "turning:" in place of twisting. Required Braces or Orthoses: Spinal Brace Spinal Brace: Lumbar corset;Other  (comment) Spinal Brace Comments: doffing easily; cues to reattach straps to velcro;  Restrictions Weight Bearing Restrictions: No      Mobility Bed Mobility Overal bed mobility: Needs Assistance Bed Mobility: Supine to Sit     Supine to sit: Supervision     General bed mobility comments: Pt performing log rolll technique  Transfers Overall transfer level: Needs assistance Equipment used: Rolling walker (2 wheeled) Transfers: Sit to/from Stand Sit to Stand: Min guard         General transfer comment: Increased time, but no physical assist    Balance Overall balance assessment: Mild deficits observed, not formally tested                                         ADL either performed or assessed with clinical judgement   ADL Overall ADL's : Needs assistance/impaired Eating/Feeding: Supervision/ safety;Sitting   Grooming: Min guard;Sitting;Cueing for safety   Upper Body Bathing: Supervision/ safety;Sitting   Lower Body Bathing: Minimal assistance;Cueing for safety;Sitting/lateral leans;Sit to/from stand;With adaptive equipment   Upper Body Dressing : Supervision/safety;Sitting   Lower Body Dressing: Minimal assistance;Cueing for safety;With adaptive equipment;Sitting/lateral leans;Sit to/from stand   Toilet Transfer: Min guard;Ambulation;RW;BSC   Toileting- Clothing Manipulation and Hygiene: Minimal assistance;Cueing for safety;Cueing for sequencing;Sitting/lateral lean;Sit to/from stand;With adaptive equipment   Tub/ Shower Transfer: Min guard;Rolling walker;Grab bars   Functional mobility during ADLs: Min guard;Rolling walker;Cueing for safety;Cueing for sequencing General ADL Comments: Pt education on AE for LB tasks - donning socks and boxers. Pt performing tasks abiding by back precautions. Pt very  lethargic and required directions loud and repetitive.     Vision Baseline Vision/History: Wears glasses Wears Glasses: At all times Patient  Visual Report: No change from baseline Vision Assessment?: No apparent visual deficits     Perception     Praxis      Pertinent Vitals/Pain Pain Assessment: Faces Faces Pain Scale: Hurts little more Pain Location: low back Pain Descriptors / Indicators: Discomfort;Sore Pain Intervention(s): Limited activity within patient's tolerance     Hand Dominance Right   Extremity/Trunk Assessment Upper Extremity Assessment Upper Extremity Assessment: Generalized weakness   Lower Extremity Assessment Lower Extremity Assessment: Defer to PT evaluation   Cervical / Trunk Assessment Cervical / Trunk Assessment: Other exceptions Cervical / Trunk Exceptions: s/p lumbar   Communication Communication Communication: HOH   Cognition Arousal/Alertness: Lethargic;Suspect due to medications Behavior During Therapy: [facility] for tasks assessed/performed Overall Cognitive Status: Within Functional Limits for tasks assessed                                 General Comments: Pt having to have directions repeated and assist to follow commands as pt would drift off to sleep   General Comments       Exercises     Shoulder Instructions      Home Living Family/patient expects to be discharged to:: Private residence Living Arrangements: Spouse/significant other Available Help at Discharge: Family;Available 24 hours/day Type of Home: House Home Access: Stairs to enter [employer] of Steps: 1   Home Layout: One level     Bathroom Shower/Tub: Teacher, early years/pre: Standard     Home Equipment: None          Prior Functioning/Environment Level of Independence: Independent                 OT Problem List: Decreased strength;Decreased activity tolerance;Decreased safety awareness;Pain;Impaired balance (sitting and/or standing);Decreased knowledge of use of DME or AE      OT Treatment/Interventions: Self-care/ADL training;Therapeutic  exercise;Energy conservation;Therapeutic activities;Visual/perceptual remediation/compensation;Patient/family education;Balance training    OT Goals(Current goals can be found in the care plan section) Acute Rehab OT Goals Patient Stated Goal: to go home OT Goal Formulation: With patient Time For Goal Achievement: 01/09/20 Potential to Achieve Goals: Good ADL Goals Pt Will Perform Lower Body Dressing: with set-up;with adaptive equipment;sitting/lateral leans;sit to/from stand Pt Will Perform Tub/Shower Transfer: with supervision;ambulating  OT Frequency: Min 2X/week   Barriers to D/C:            Co-evaluation              AM-PAC OT "6 Clicks" Daily Activity     Outcome Measure Help from another person eating meals?: None Help from another person taking care of personal grooming?: A Little Help from another person toileting, which includes using toliet, bedpan, or urinal?: A Little Help from another person bathing (including washing, rinsing, drying)?: A Lot Help from another person to put on and taking off regular upper body clothing?: A Little Help from another person to put on and taking off regular lower body clothing?: A Lot 6 Click Score: 17   End of Session Equipment Utilized During Treatment: Rolling walker;Back brace Nurse Communication: Mobility status  Activity Tolerance: Patient tolerated treatment well;Patient limited by pain Patient left: in bed;with call bell/phone within reach  OT Visit Diagnosis: Unsteadiness on feet (R26.81);Muscle weakness (generalized) (M62.81);Pain Pain - part of body: (back)  [id number] OT Time Calculation (min): 31 min Charges:  OT General Charges $OT Visit: 1 Visit OT Evaluation $OT Eval Moderate Complexity: 1 Mod OT Treatments $Self Care/Home Management : 8-22 mins  Jefferey Pica, OTR/L Acute Rehabilitation Services Pager: [phone] Office: [place] 12/26/2019, 9:59 AM

## 2019-12-26 NOTE — Anesthesia Postprocedure Evaluation (Signed)
Anesthesia Post Note  Patient: Bryan [name]  Procedure(s) Performed: Posterior Lumbar Interbody Fusion - Lumbar Four-Lumbar Five (N/A Back)     Patient location during evaluation: PACU Anesthesia Type: General Level of consciousness: sedated and patient cooperative Pain management: pain level controlled Vital Signs Assessment: post-procedure vital signs reviewed and stable Respiratory status: spontaneous breathing Cardiovascular status: stable Anesthetic complications: no    Last Vitals:  Vitals:   12/26/19 0503 12/26/19 0723  BP: (!) 164/66 (!) 112/51  Pulse: 72 70  Resp: 18 16  Temp: 36.9 C 37.2 C  SpO2: 97% 100%    Last Pain:  Vitals:   12/26/19 0723  TempSrc: Oral  PainSc:                  [name]

## 2019-12-26 NOTE — TOC Transition Note (Signed)
Transition of Care [facility]) - CM/SW Discharge Note   Patient Details  Name: Bryan [name] MRN: [id number] Date of Birth: [birth year]  [facility] of [facility] CM/SW Contact:  Claudie Leach, RN 12/26/2019, 4:37 PM   Clinical Narrative:    Patient to d/c home tomorrow with [facility] PT/OT.  Referral accepted by [facility].     Final next level of care: [place] Barriers to Discharge: No Barriers Identified    Discharge Plan and Services             DME Arranged: (per 3C staff)    HH Arranged: PT, OT HH Agency: [place] Date [place]: 12/26/19 Time [place]: 1626 Representative spoke with at [place]: Tommi Rumps

## 2019-12-27 LAB — CBC
HCT: 27.1 % — ABNORMAL LOW (ref 39.0–52.0)
Hemoglobin: 8.9 g/dL — ABNORMAL LOW (ref 13.0–17.0)
MCH: 31.9 pg (ref 26.0–34.0)
MCHC: 32.8 g/dL (ref 30.0–36.0)
MCV: 97.1 fL (ref 80.0–100.0)
Platelets: 148 10*3/uL — ABNORMAL LOW (ref 150–400)
RBC: 2.79 MIL/uL — ABNORMAL LOW (ref 4.22–5.81)
RDW: 13.3 % (ref 11.5–15.5)
WBC: 8.6 10*3/uL (ref 4.0–10.5)
nRBC: 0 % (ref 0.0–0.2)

## 2019-12-27 MED ORDER — HYDROCODONE-ACETAMINOPHEN 5-325 MG PO TABS: 1.0000 | ORAL_TABLET | ORAL | 0 refills | Status: AC | PRN

## 2019-12-27 MED ORDER — FERROUS SULFATE 325 (65 FE) MG PO TABS: 325.0000 mg | ORAL_TABLET | Freq: Two times a day (BID) | ORAL | 0 refills | Status: DC

## 2019-12-27 MED ORDER — METHOCARBAMOL 500 MG PO TABS: 500.0000 mg | ORAL_TABLET | Freq: Four times a day (QID) | ORAL | 0 refills | Status: DC | PRN

## 2019-12-27 MED ORDER — METHOCARBAMOL 500 MG PO TABS
500.0000 mg | ORAL_TABLET | Freq: Four times a day (QID) | ORAL | Status: DC | PRN
  Administered 2019-12-27: 500 mg via ORAL
  Filled 2019-12-27: qty 1

## 2019-12-27 NOTE — Progress Notes (Signed)
Physical Therapy Treatment Patient Details Name: Bryan [name] MRN: [id number] DOB: 1946/01/06 Today's Date: 12/27/2019    History of Present Illness 74 yo admitted for L4-S1 PLIF due to back, hip and leg pain. PMhx: L4-5 lami, ACDF, HTN, OA, essential tremor, BPH, GERD, obesity    PT Comments    Patient dry heaving upon PT arrival and reports nausea. Also reports muscle spasms down RLE and pain in neck as well as surgical incision. Pt not feeling well today. Min guard assist to stand from EOB and Min guard for gait training with use of RW. Cues needed for RW proximity and to keep eyes open. Pt falling asleep during session and needs constant stimulus to stay awake and engaged. Reviewed back precautions as pt does not recall from yesterday's session. Session limited due to lethargy and decreased arousal. Recommend HHPT based on performance today. Will follow.   Follow Up Recommendations  Home health PT;Supervision/Assistance - 24 hour     Equipment Recommendations  Rolling walker with 5" wheels;3in1 (PT)    Recommendations for Other Services       Precautions / Restrictions Precautions Precautions: Back;Fall Precaution Booklet Issued: Yes (comment) Precaution Comments: Reviewed back precautions, not able to recall any from yesterday's session Required Braces or Orthoses: Spinal Brace Spinal Brace: Lumbar corset Restrictions Weight Bearing Restrictions: No    Mobility  Bed Mobility               General bed mobility comments: Sitting EOB upon PT arrival.  Transfers Overall transfer level: Needs assistance Equipment used: Rolling walker (2 wheeled) Transfers: Sit to/from Stand Sit to Stand: Min guard         General transfer comment: cues for hand placement, stood from EOB x1, transferred to chair post ambulation.  Ambulation/Gait Ambulation/Gait assistance: Min guard Gait Distance (Feet): 100 Feet Assistive device: Rolling walker (2 wheeled) Gait  Pattern/deviations: Step-through pattern;Decreased stride length;Trunk flexed Gait velocity: decreased Gait velocity interpretation: <1.31 ft/sec, indicative of household ambulator General Gait Details: Cues for RW proximity and to keep eyes open during gait. Cues for upright. Limited due to arousal.   Stairs             Wheelchair Mobility    Modified Rankin (Stroke Patients Only)       Balance Overall balance assessment: Mild deficits observed, not formally tested                                          Cognition Arousal/Alertness: Lethargic Behavior During Therapy: WFL for tasks assessed/performed Overall Cognitive Status: Impaired/Different from baseline Area of Impairment: Following commands;Attention;Memory                   Current Attention Level: Focused Memory: Decreased short-term memory;Decreased recall of precautions Following Commands: Follows one step commands with increased time       General Comments: Requires frequent repetition of cues and stimulus to stay awake, keeps eyes closed. Falling asleep easily when not stimulated. Not able to recall precautions      Exercises      General Comments General comments (skin integrity, edema, etc.): Pt + dry heaving post breakfast and nausous. RN notified.      Pertinent Vitals/Pain Pain Assessment: Faces Faces Pain Scale: Hurts even more Pain Location: upper neck, spasms down RLE, surgical pain Pain Descriptors / Indicators: Discomfort;Sore;Grimacing;Spasm Pain Intervention(s): Repositioned;Monitored during session;Limited  activity within patient's tolerance    Home Living                      Prior Function            PT Goals (current goals can now be found in the care plan section) Progress towards PT goals: Not progressing toward goals - comment(due to lethargy)    Frequency    Min 5X/week      PT Plan Discharge plan needs to be updated     Co-evaluation              AM-PAC PT "6 Clicks" Mobility   Outcome Measure  Help needed turning from your back to your side while in a flat bed without using bedrails?: A Little Help needed moving from lying on your back to sitting on the side of a flat bed without using bedrails?: A Little Help needed moving to and from a bed to a chair (including a wheelchair)?: A Little Help needed standing up from a chair using your arms (e.g., wheelchair or bedside chair)?: A Little Help needed to walk in hospital room?: A Little Help needed climbing 3-5 steps with a railing? : A Little 6 Click Score: 18    End of Session Equipment Utilized During Treatment: Back brace Activity Tolerance: Patient limited by lethargy Patient left: in chair;with call bell/phone within reach Nurse Communication: Mobility status PT Visit Diagnosis: Other abnormalities of gait and mobility (R26.89);Muscle weakness (generalized) (M62.81)     Time: [id number] PT Time Calculation (min) (ACUTE ONLY): 11 min  Charges:  $Gait Training: 8-22 mins                     Marisa Severin, PT, DPT Acute Rehabilitation Services Pager [phone] Office [phone]       [place] 12/27/2019, 9:26 AM

## 2019-12-27 NOTE — Plan of Care (Signed)
Pt and wife given D/C instructions with verbal understanding. Rx's were sent to pharmacy by MD. Pt's incision is clean and dry with no sign of infection. Pt's IV and Hemovac were removed prior to D/C. Pt D/C'd home via wheelchair per MD order. Pt's Home Health was arranged by CM and RW and 3-n-1 were given to the Pt. Pt is stable @ D/C and has no other needs at this time. Holli Humbles, RN

## 2019-12-27 NOTE — Progress Notes (Signed)
Occupational Therapy Treatment Patient Details Name: Bryan [name] MRN: [id number] DOB: May 03, 1946 Today's Date: 12/27/2019    History of present illness 74 yo admitted for L4-S1 PLIF due to back, hip and leg pain. PMhx: L4-5 lami, ACDF, HTN, OA, essential tremor, BPH, GERD, obesity   OT comments  Pt progressing, but remains very lethargic. Pt education on AE for LB tasks - donning/doffing socks. Pt performing tasks abiding by back precautions. Pt very lethargic and required directions loud and repetitive. pt requiring cues to stay awake as pt very lethargic and requiring mutlimodal cues to complete tasks. Tub transfer simulation: Cues for hand placement; minguardA for steadying. Pt requires cues to state back precautions, but has no difficulty following them once performing ADL tasks. Pt is appropriate to return home with [facility] therapies and BSC if needed. OT continue to follow acutely.   Follow Up Recommendations  Home health OT;Supervision/Assistance - 24 hour    Equipment Recommendations  3 in 1 bedside commode    Recommendations for Other Services      Precautions / Restrictions Precautions Precautions: Back;Fall Precaution Booklet Issued: Yes (comment) Precaution Comments: reviewed back precautions- unable to recall 2/3 precautions Required Braces or Orthoses: Spinal Brace Spinal Brace: Lumbar corset Restrictions Weight Bearing Restrictions: No       Mobility Bed Mobility               General bed mobility comments: sitting in recliner  Transfers Overall transfer level: Needs assistance Equipment used: Rolling walker (2 wheeled) Transfers: Sit to/from Stand Sit to Stand: Min guard         General transfer comment: Pt supervisionA from sit to stand from recliner    Balance Overall balance assessment: Mild deficits observed, not formally tested                                         ADL either performed or assessed with clinical judgement    ADL Overall ADL's : Needs assistance/impaired     Grooming: Standing;Cueing for safety;Cueing for sequencing               Lower Body Dressing: Min guard;Minimal assistance Lower Body Dressing Details (indicate cue type and reason): Pt using sock and, dressing stick and reacher with good carry over skills from yesterday to perform task.         Tub/ Shower Transfer: Agricultural engineer;Ambulation Tub/Shower Transfer Details (indicate cue type and reason): Cues for hand placement; minguardA for steadying Functional mobility during ADLs: Min guard;Rolling walker;Cueing for safety;Cueing for sequencing General ADL Comments: Pt education on AE for LB tasks - donning socks. Pt performing tasks abiding by back precautions. Pt very lethargic and required directions loud and repetitive.     Vision       Perception     Praxis      Cognition Arousal/Alertness: Lethargic Behavior During Therapy: WFL for tasks assessed/performed Overall Cognitive Status: Impaired/Different from baseline Area of Impairment: Following commands;Attention;Memory                   Current Attention Level: Focused Memory: Decreased short-term memory;Decreased recall of precautions Following Commands: Follows one step commands with increased time       General Comments: pt requiring cues to stay awake as pt very lethargic and requiring mutlimodal cues to complete tasks.        Exercises  Shoulder Instructions       General Comments No lethargic, but eventually arousing to perform LB ADL, grooming and tub simulation tasks.    Pertinent Vitals/ Pain       Pain Assessment: Faces Faces Pain Scale: Hurts even more Pain Location: low back Pain Descriptors / Indicators: Discomfort;Sore;Grimacing;Spasm Pain Intervention(s): Monitored during session;Premedicated before session  Home Living                                          Prior Functioning/Environment               Frequency  Min 2X/week        Progress Toward Goals  OT Goals(current goals can now be found in the care plan section)  Progress towards OT goals: Progressing toward goals  Acute Rehab OT Goals Patient Stated Goal: return home OT Goal Formulation: With patient Time For Goal Achievement: 01/09/20 Potential to Achieve Goals: Good ADL Goals Pt Will Perform Lower Body Dressing: with set-up;with adaptive equipment;sitting/lateral leans;sit to/from stand Pt Will Perform Tub/Shower Transfer: with supervision;ambulating  Plan Discharge plan remains appropriate    Co-evaluation                 AM-PAC OT "6 Clicks" Daily Activity     Outcome Measure   Help from another person eating meals?: None Help from another person taking care of personal grooming?: A Little Help from another person toileting, which includes using toliet, bedpan, or urinal?: A Little Help from another person bathing (including washing, rinsing, drying)?: A Lot Help from another person to put on and taking off regular upper body clothing?: A Little Help from another person to put on and taking off regular lower body clothing?: A Lot 6 Click Score: 17    End of Session Equipment Utilized During Treatment: Rolling walker;Back brace  OT Visit Diagnosis: Unsteadiness on feet (R26.81);Muscle weakness (generalized) (M62.81);Pain Pain - part of body: (back)   Activity Tolerance Patient tolerated treatment well;Patient limited by pain   Patient Left in chair;with call bell/phone within reach   Nurse Communication Mobility status        Time: [id number] OT Time Calculation (min): 22 min  Charges: OT General Charges $OT Visit: 1 Visit OT Treatments $Self Care/Home Management : 8-22 mins  Bryan [name], OTR/L [place] Pager: [phone] Office: [phone]   Bryan [name] 12/27/2019, 12:41 PM

## 2019-12-27 NOTE — Discharge Instructions (Signed)
Wound Care Keep incision covered and dry for two days.    Do not put any creams, lotions, or ointments on incision. Leave steri-strips on back.  They will fall off by themselves.  Activity Walk each and every day, increasing distance each day. No lifting greater than 5 lbs.  Avoid excessive neck motion. No driving for 2 weeks; may ride as a passenger locally.  Diet Resume your normal diet.   Return to Work Will be discussed at your follow up appointment.  Call Your Doctor If Any of These Occur Redness, drainage, or swelling at the wound.  Temperature greater than 101 degrees. Severe pain not relieved by pain medication. Incision starts to come apart.  Follow Up Appt Call today for appointment in 1-2 weeks ([phone]) or for problems.  If you have any hardware placed in your spine, you will need an x-ray before your appointment.  

## 2019-12-27 NOTE — Discharge Summary (Signed)
Physician Discharge Summary  Patient ID: Bryan [name] MRN: [id number] DOB/AGE: 76-Jul-1947 76 y.o.  Admit date: 12/25/2019 Discharge date: 12/27/2019  Admission Diagnoses: Spondylolisthesis at L4-5 level  Discharge Diagnoses:  Active Problems:   Spondylolisthesis at L4-L5 level   Discharged Condition: good  Hospital Course: Bryan [name] was admitted to 3C03 following L4-5 PLIF by Dr. Saintclair Halsted. Hemovac drain continued to put out a generous amount of sanguinous drainage the day following surgery. He was started on an iron supplement. His hemoglobin was monitored post op day 1 and 2. It appears to be stable. His drainage into the Hemovac has slowed and the drain is ready to be removed. He has mobilized with PT and OT and they are recommending home health OT as of now. Bryan [name] is ready for discharge home.  Consults: rehabilitation medicine  Significant Diagnostic Studies: radiology: DG Lumbar Spine 2-3 Views  Result Date: 12/25/2019 CLINICAL DATA:  Posterior lumbar interbody fusion EXAM: DG C-ARM 1-60 MIN; LUMBAR SPINE - 2-3 VIEW FLUOROSCOPY TIME:  Fluoroscopy Time:  14 seconds COMPARISON:  None. FINDINGS: Multiple intraoperative fluoroscopic spot images are provided. Posterior lumbar interbody fusion from L4 through S1. IMPRESSION: Intraoperative localization. Electronically Signed   By: Kathreen Devoid   On: 12/25/2019 12:13   DG C-Arm 1-60 Min  Result Date: 12/25/2019 CLINICAL DATA:  Posterior lumbar interbody fusion EXAM: DG C-ARM 1-60 MIN; LUMBAR SPINE - 2-3 VIEW FLUOROSCOPY TIME:  Fluoroscopy Time:  14 seconds COMPARISON:  None. FINDINGS: Multiple intraoperative fluoroscopic spot images are provided. Posterior lumbar interbody fusion from L4 through S1. IMPRESSION: Intraoperative localization. Electronically Signed   By: Kathreen Devoid   On: 12/25/2019 12:13   DG MINI C-ARM IMAGE ONLY  Result Date: 12/25/2019 There is no interpretation for this exam.  This order is for images obtained during a  surgical procedure.  Please See "Surgeries" Tab for more information regarding the procedure.     Treatments: surgery:  1. Redo decompressive lumbar laminectomy with complete mental facetectomies of L4-5 and radical foraminotomies of the L4 and L5 nerve roots in excess and requiring more work than would be needed with a standard interbody fusion  2.  Posterior lumbar interbody fusion L4-5 utilizing the globus insert and rotate titanium cages packed with locally harvested autograft mixed with DBX mix  3.  Cortical screw fixation L4-S1 utilizing the globus Creo modular and cortical screw set  4.  Posterior lateral arthrodesis L4-S1 utilizing locally harvested autograft mixed  Discharge Exam: Blood pressure (!) 144/69, pulse 77, temperature 99.1 F (37.3 C), temperature source Oral, resp. rate 18, height 5\' 6"  (1.676 m), weight 98 kg, SpO2 99 %.  Alert and oriented x 4 PERRLA CN II-XII grossly intact MAE, Strength and sensation intact, Slightly weaker on RLE Incision is covered with Honeycomb dressing and Steri Strips; Dressing is clean, dry, and intact Hemovac with 90 mL out overnight  Disposition: Discharge disposition: 01-Home or Self Care       Discharge Instructions    Face-to-face encounter (required for Medicare/Medicaid patients)   Complete by: As directed    I Patricia Nettle certify that this patient is under my care and that I, or a nurse practitioner or physician's assistant working with me, had a face-to-face encounter that meets the physician face-to-face encounter requirements with this patient on 12/27/2019. The encounter with the patient was in whole, or in part for the following medical condition(s) which is the primary reason for home health care (List medical condition): Severe lumbar  spinal stenosis; status post L4-5 lumbar fusion   The encounter with the patient was in whole, or in part, for the following medical condition, which is the primary reason for home  health care: Severe lumbar spinal stenosis; status post L4-5 lumbar fusion   I certify that, based on my findings, the following services are medically necessary home health services: Physical therapy   Reason for Medically Necessary Home Health Services: Therapy- Therapeutic Exercises to Increase Strength and Endurance   My clinical findings support the need for the above services: Unable to leave home safely without assistance and/or assistive device   Further, I certify that my clinical findings support that this patient is homebound due to: Unable to leave home safely without assistance   For home use only DME 4 wheeled rolling walker with seat   Complete by: As directed    Patient needs a walker to treat with the following condition: Status post lumbar spinal fusion   Home Health   Complete by: As directed    To provide the following care/treatments:  PT OT       Allergies as of 12/27/2019      Reactions   Lisinopril Other (See Comments), Cough   Cough   Other Other (See Comments)   BLOOD PRODUCT REFUSAL (patient is a Jehovah's Witness)      Medication List    TAKE these medications   atorvastatin 10 MG tablet Commonly known as: LIPITOR Take 10 mg by mouth every evening.   citalopram 20 MG tablet Commonly known as: CELEXA Take 20 mg by mouth daily.   ferrous sulfate 325 (65 FE) MG tablet Take 1 tablet (325 mg total) by mouth 2 (two) times daily with a meal.   hydrochlorothiazide 25 MG tablet Commonly known as: HYDRODIURIL Take 1 tablet (25 mg total) by mouth daily.   HYDROcodone-acetaminophen 5-325 MG tablet Commonly known as: NORCO/VICODIN Take 1 tablet by mouth every 4 (four) hours as needed for moderate pain.   meloxicam 7.5 MG tablet Commonly known as: MOBIC Take 7.5 mg by mouth daily.   methocarbamol 750 MG tablet Commonly known as: ROBAXIN Take 750 mg by mouth 2 (two) times daily. What changed: Another medication with the same name was added. Make sure  you understand how and when to take each.   methocarbamol 500 MG tablet Commonly known as: ROBAXIN Take 1 tablet (500 mg total) by mouth every 6 (six) hours as needed for muscle spasms. What changed: You were already taking a medication with the same name, and this prescription was added. Make sure you understand how and when to take each.   metoprolol succinate 50 MG 24 hr tablet Commonly known as: TOPROL-XL TAKE 1 TABLET BY MOUTH ONCE DAILY WITH  OR  IMMEDIATELY  FOLLOWING  A  MEAL   primidone 50 MG tablet Commonly known as: MYSOLINE Take 50-100 mg by mouth See admin instructions. Take 2 tablets (100 mg) by mouth in the morning & take 1 tablet (50 mg) by mouth in the evening   tamsulosin 0.4 MG Caps capsule Commonly known as: FLOMAX Take 0.5 mg by mouth at bedtime.            Durable Medical Equipment  (From admission, onward)         Start     Ordered   12/27/19 1000  For home use only DME 3 n 1  Once     12/27/19 1002   12/27/19 0000  For home use only  DME 4 wheeled rolling walker with seat    Question:  Patient needs a walker to treat with the following condition  Answer:  Status post lumbar spinal fusion   12/27/19 1002   12/26/19 1334  For home use only DME 3 n 1  Once     12/26/19 1333   12/26/19 1334  For home use only DME Walker  Once    Question:  Patient needs a walker to treat with the following condition  Answer:  S/P lumbar spinal fusion   12/26/19 1333         Follow-up Information    [facility] Follow up.   Specialty: [place] Why: Agency will contact you Monday or Tuesday to arrange therapy visits.  Contact information: [place] [address] [phone]        Kary Kos, MD. Schedule an appointment as soon as possible for a visit in 2 week(s).   Specialty: Neurosurgery Contact information: [address] [place] [address] [place] [address] [phone]            Signed: Patricia Nettle 12/27/2019, 10:04 AM

## 2019-12-30 DIAGNOSIS — M47812 Spondylosis without myelopathy or radiculopathy, cervical region: Secondary | ICD-10-CM | POA: Diagnosis not present

## 2019-12-30 DIAGNOSIS — M4316 Spondylolisthesis, lumbar region: Secondary | ICD-10-CM | POA: Diagnosis not present

## 2019-12-30 DIAGNOSIS — M069 Rheumatoid arthritis, unspecified: Secondary | ICD-10-CM | POA: Diagnosis not present

## 2019-12-30 DIAGNOSIS — I10 Essential (primary) hypertension: Secondary | ICD-10-CM | POA: Diagnosis not present

## 2019-12-30 DIAGNOSIS — M170 Bilateral primary osteoarthritis of knee: Secondary | ICD-10-CM | POA: Diagnosis not present

## 2019-12-30 DIAGNOSIS — M19042 Primary osteoarthritis, left hand: Secondary | ICD-10-CM | POA: Diagnosis not present

## 2019-12-30 DIAGNOSIS — M19041 Primary osteoarthritis, right hand: Secondary | ICD-10-CM | POA: Diagnosis not present

## 2019-12-30 DIAGNOSIS — M48062 Spinal stenosis, lumbar region with neurogenic claudication: Secondary | ICD-10-CM | POA: Diagnosis not present

## 2019-12-30 DIAGNOSIS — Z4789 Encounter for other orthopedic aftercare: Secondary | ICD-10-CM | POA: Diagnosis not present

## 2020-01-01 DIAGNOSIS — M170 Bilateral primary osteoarthritis of knee: Secondary | ICD-10-CM | POA: Diagnosis not present

## 2020-01-01 DIAGNOSIS — M4316 Spondylolisthesis, lumbar region: Secondary | ICD-10-CM | POA: Diagnosis not present

## 2020-01-01 DIAGNOSIS — M47812 Spondylosis without myelopathy or radiculopathy, cervical region: Secondary | ICD-10-CM | POA: Diagnosis not present

## 2020-01-01 DIAGNOSIS — M069 Rheumatoid arthritis, unspecified: Secondary | ICD-10-CM | POA: Diagnosis not present

## 2020-01-01 DIAGNOSIS — I10 Essential (primary) hypertension: Secondary | ICD-10-CM | POA: Diagnosis not present

## 2020-01-01 DIAGNOSIS — M19042 Primary osteoarthritis, left hand: Secondary | ICD-10-CM | POA: Diagnosis not present

## 2020-01-01 DIAGNOSIS — M48062 Spinal stenosis, lumbar region with neurogenic claudication: Secondary | ICD-10-CM | POA: Diagnosis not present

## 2020-01-01 DIAGNOSIS — M19041 Primary osteoarthritis, right hand: Secondary | ICD-10-CM | POA: Diagnosis not present

## 2020-01-01 DIAGNOSIS — Z4789 Encounter for other orthopedic aftercare: Secondary | ICD-10-CM | POA: Diagnosis not present

## 2020-01-04 ENCOUNTER — Encounter: Payer: Self-pay | Admitting: *Deleted

## 2020-01-04 DIAGNOSIS — M48062 Spinal stenosis, lumbar region with neurogenic claudication: Secondary | ICD-10-CM | POA: Diagnosis not present

## 2020-01-04 DIAGNOSIS — M47812 Spondylosis without myelopathy or radiculopathy, cervical region: Secondary | ICD-10-CM | POA: Diagnosis not present

## 2020-01-04 DIAGNOSIS — M069 Rheumatoid arthritis, unspecified: Secondary | ICD-10-CM | POA: Diagnosis not present

## 2020-01-04 DIAGNOSIS — I10 Essential (primary) hypertension: Secondary | ICD-10-CM | POA: Diagnosis not present

## 2020-01-04 DIAGNOSIS — M4316 Spondylolisthesis, lumbar region: Secondary | ICD-10-CM | POA: Diagnosis not present

## 2020-01-04 DIAGNOSIS — M19041 Primary osteoarthritis, right hand: Secondary | ICD-10-CM | POA: Diagnosis not present

## 2020-01-04 DIAGNOSIS — M170 Bilateral primary osteoarthritis of knee: Secondary | ICD-10-CM | POA: Diagnosis not present

## 2020-01-04 DIAGNOSIS — M19042 Primary osteoarthritis, left hand: Secondary | ICD-10-CM | POA: Diagnosis not present

## 2020-01-04 DIAGNOSIS — Z4789 Encounter for other orthopedic aftercare: Secondary | ICD-10-CM | POA: Diagnosis not present

## 2020-01-05 DIAGNOSIS — M170 Bilateral primary osteoarthritis of knee: Secondary | ICD-10-CM | POA: Diagnosis not present

## 2020-01-05 DIAGNOSIS — I10 Essential (primary) hypertension: Secondary | ICD-10-CM | POA: Diagnosis not present

## 2020-01-05 DIAGNOSIS — M069 Rheumatoid arthritis, unspecified: Secondary | ICD-10-CM | POA: Diagnosis not present

## 2020-01-05 DIAGNOSIS — M19041 Primary osteoarthritis, right hand: Secondary | ICD-10-CM | POA: Diagnosis not present

## 2020-01-05 DIAGNOSIS — M19042 Primary osteoarthritis, left hand: Secondary | ICD-10-CM | POA: Diagnosis not present

## 2020-01-05 DIAGNOSIS — M47812 Spondylosis without myelopathy or radiculopathy, cervical region: Secondary | ICD-10-CM | POA: Diagnosis not present

## 2020-01-05 DIAGNOSIS — M4316 Spondylolisthesis, lumbar region: Secondary | ICD-10-CM | POA: Diagnosis not present

## 2020-01-05 DIAGNOSIS — M48062 Spinal stenosis, lumbar region with neurogenic claudication: Secondary | ICD-10-CM | POA: Diagnosis not present

## 2020-01-05 DIAGNOSIS — Z4789 Encounter for other orthopedic aftercare: Secondary | ICD-10-CM | POA: Diagnosis not present

## 2020-01-06 DIAGNOSIS — M19041 Primary osteoarthritis, right hand: Secondary | ICD-10-CM | POA: Diagnosis not present

## 2020-01-06 DIAGNOSIS — M48062 Spinal stenosis, lumbar region with neurogenic claudication: Secondary | ICD-10-CM | POA: Diagnosis not present

## 2020-01-06 DIAGNOSIS — Z4789 Encounter for other orthopedic aftercare: Secondary | ICD-10-CM | POA: Diagnosis not present

## 2020-01-06 DIAGNOSIS — M47812 Spondylosis without myelopathy or radiculopathy, cervical region: Secondary | ICD-10-CM | POA: Diagnosis not present

## 2020-01-06 DIAGNOSIS — M19042 Primary osteoarthritis, left hand: Secondary | ICD-10-CM | POA: Diagnosis not present

## 2020-01-06 DIAGNOSIS — M170 Bilateral primary osteoarthritis of knee: Secondary | ICD-10-CM | POA: Diagnosis not present

## 2020-01-06 DIAGNOSIS — M069 Rheumatoid arthritis, unspecified: Secondary | ICD-10-CM | POA: Diagnosis not present

## 2020-01-06 DIAGNOSIS — I10 Essential (primary) hypertension: Secondary | ICD-10-CM | POA: Diagnosis not present

## 2020-01-06 DIAGNOSIS — M4316 Spondylolisthesis, lumbar region: Secondary | ICD-10-CM | POA: Diagnosis not present

## 2020-01-07 DIAGNOSIS — M47812 Spondylosis without myelopathy or radiculopathy, cervical region: Secondary | ICD-10-CM | POA: Diagnosis not present

## 2020-01-07 DIAGNOSIS — M4316 Spondylolisthesis, lumbar region: Secondary | ICD-10-CM | POA: Diagnosis not present

## 2020-01-07 DIAGNOSIS — M48062 Spinal stenosis, lumbar region with neurogenic claudication: Secondary | ICD-10-CM | POA: Diagnosis not present

## 2020-01-07 DIAGNOSIS — M069 Rheumatoid arthritis, unspecified: Secondary | ICD-10-CM | POA: Diagnosis not present

## 2020-01-07 DIAGNOSIS — M19042 Primary osteoarthritis, left hand: Secondary | ICD-10-CM | POA: Diagnosis not present

## 2020-01-07 DIAGNOSIS — M170 Bilateral primary osteoarthritis of knee: Secondary | ICD-10-CM | POA: Diagnosis not present

## 2020-01-07 DIAGNOSIS — I10 Essential (primary) hypertension: Secondary | ICD-10-CM | POA: Diagnosis not present

## 2020-01-07 DIAGNOSIS — M19041 Primary osteoarthritis, right hand: Secondary | ICD-10-CM | POA: Diagnosis not present

## 2020-01-07 DIAGNOSIS — Z4789 Encounter for other orthopedic aftercare: Secondary | ICD-10-CM | POA: Diagnosis not present

## 2020-01-11 DIAGNOSIS — M170 Bilateral primary osteoarthritis of knee: Secondary | ICD-10-CM | POA: Diagnosis not present

## 2020-01-11 DIAGNOSIS — M4316 Spondylolisthesis, lumbar region: Secondary | ICD-10-CM | POA: Diagnosis not present

## 2020-01-11 DIAGNOSIS — Z4789 Encounter for other orthopedic aftercare: Secondary | ICD-10-CM | POA: Diagnosis not present

## 2020-01-11 DIAGNOSIS — M19041 Primary osteoarthritis, right hand: Secondary | ICD-10-CM | POA: Diagnosis not present

## 2020-01-11 DIAGNOSIS — M19042 Primary osteoarthritis, left hand: Secondary | ICD-10-CM | POA: Diagnosis not present

## 2020-01-11 DIAGNOSIS — M069 Rheumatoid arthritis, unspecified: Secondary | ICD-10-CM | POA: Diagnosis not present

## 2020-01-11 DIAGNOSIS — M48062 Spinal stenosis, lumbar region with neurogenic claudication: Secondary | ICD-10-CM | POA: Diagnosis not present

## 2020-01-11 DIAGNOSIS — I10 Essential (primary) hypertension: Secondary | ICD-10-CM | POA: Diagnosis not present

## 2020-01-11 DIAGNOSIS — M47812 Spondylosis without myelopathy or radiculopathy, cervical region: Secondary | ICD-10-CM | POA: Diagnosis not present

## 2020-01-12 DIAGNOSIS — M170 Bilateral primary osteoarthritis of knee: Secondary | ICD-10-CM | POA: Diagnosis not present

## 2020-01-12 DIAGNOSIS — M4316 Spondylolisthesis, lumbar region: Secondary | ICD-10-CM | POA: Diagnosis not present

## 2020-01-12 DIAGNOSIS — M069 Rheumatoid arthritis, unspecified: Secondary | ICD-10-CM | POA: Diagnosis not present

## 2020-01-12 DIAGNOSIS — Z4789 Encounter for other orthopedic aftercare: Secondary | ICD-10-CM | POA: Diagnosis not present

## 2020-01-12 DIAGNOSIS — M19041 Primary osteoarthritis, right hand: Secondary | ICD-10-CM | POA: Diagnosis not present

## 2020-01-12 DIAGNOSIS — I10 Essential (primary) hypertension: Secondary | ICD-10-CM | POA: Diagnosis not present

## 2020-01-12 DIAGNOSIS — M47812 Spondylosis without myelopathy or radiculopathy, cervical region: Secondary | ICD-10-CM | POA: Diagnosis not present

## 2020-01-12 DIAGNOSIS — M48062 Spinal stenosis, lumbar region with neurogenic claudication: Secondary | ICD-10-CM | POA: Diagnosis not present

## 2020-01-12 DIAGNOSIS — M19042 Primary osteoarthritis, left hand: Secondary | ICD-10-CM | POA: Diagnosis not present

## 2020-01-13 DIAGNOSIS — M48062 Spinal stenosis, lumbar region with neurogenic claudication: Secondary | ICD-10-CM | POA: Diagnosis not present

## 2020-01-13 DIAGNOSIS — M4316 Spondylolisthesis, lumbar region: Secondary | ICD-10-CM | POA: Diagnosis not present

## 2020-01-13 DIAGNOSIS — M19042 Primary osteoarthritis, left hand: Secondary | ICD-10-CM | POA: Diagnosis not present

## 2020-01-13 DIAGNOSIS — I10 Essential (primary) hypertension: Secondary | ICD-10-CM | POA: Diagnosis not present

## 2020-01-13 DIAGNOSIS — M069 Rheumatoid arthritis, unspecified: Secondary | ICD-10-CM | POA: Diagnosis not present

## 2020-01-13 DIAGNOSIS — Z4789 Encounter for other orthopedic aftercare: Secondary | ICD-10-CM | POA: Diagnosis not present

## 2020-01-13 DIAGNOSIS — M170 Bilateral primary osteoarthritis of knee: Secondary | ICD-10-CM | POA: Diagnosis not present

## 2020-01-13 DIAGNOSIS — M19041 Primary osteoarthritis, right hand: Secondary | ICD-10-CM | POA: Diagnosis not present

## 2020-01-13 DIAGNOSIS — M47812 Spondylosis without myelopathy or radiculopathy, cervical region: Secondary | ICD-10-CM | POA: Diagnosis not present

## 2020-01-18 DIAGNOSIS — M069 Rheumatoid arthritis, unspecified: Secondary | ICD-10-CM | POA: Diagnosis not present

## 2020-01-18 DIAGNOSIS — M19042 Primary osteoarthritis, left hand: Secondary | ICD-10-CM | POA: Diagnosis not present

## 2020-01-18 DIAGNOSIS — Z4789 Encounter for other orthopedic aftercare: Secondary | ICD-10-CM | POA: Diagnosis not present

## 2020-01-18 DIAGNOSIS — M19041 Primary osteoarthritis, right hand: Secondary | ICD-10-CM | POA: Diagnosis not present

## 2020-01-18 DIAGNOSIS — M47812 Spondylosis without myelopathy or radiculopathy, cervical region: Secondary | ICD-10-CM | POA: Diagnosis not present

## 2020-01-18 DIAGNOSIS — I10 Essential (primary) hypertension: Secondary | ICD-10-CM | POA: Diagnosis not present

## 2020-01-18 DIAGNOSIS — M4316 Spondylolisthesis, lumbar region: Secondary | ICD-10-CM | POA: Diagnosis not present

## 2020-01-18 DIAGNOSIS — M48062 Spinal stenosis, lumbar region with neurogenic claudication: Secondary | ICD-10-CM | POA: Diagnosis not present

## 2020-01-18 DIAGNOSIS — M170 Bilateral primary osteoarthritis of knee: Secondary | ICD-10-CM | POA: Diagnosis not present

## 2020-01-20 ENCOUNTER — Ambulatory Visit: Payer: Medicare HMO | Admitting: Family Medicine

## 2020-01-21 ENCOUNTER — Encounter: Payer: Self-pay | Admitting: Family Medicine

## 2020-01-25 DIAGNOSIS — M19042 Primary osteoarthritis, left hand: Secondary | ICD-10-CM | POA: Diagnosis not present

## 2020-01-25 DIAGNOSIS — Z4789 Encounter for other orthopedic aftercare: Secondary | ICD-10-CM | POA: Diagnosis not present

## 2020-01-25 DIAGNOSIS — M170 Bilateral primary osteoarthritis of knee: Secondary | ICD-10-CM | POA: Diagnosis not present

## 2020-01-25 DIAGNOSIS — M069 Rheumatoid arthritis, unspecified: Secondary | ICD-10-CM | POA: Diagnosis not present

## 2020-01-25 DIAGNOSIS — M47812 Spondylosis without myelopathy or radiculopathy, cervical region: Secondary | ICD-10-CM | POA: Diagnosis not present

## 2020-01-25 DIAGNOSIS — M19041 Primary osteoarthritis, right hand: Secondary | ICD-10-CM | POA: Diagnosis not present

## 2020-01-25 DIAGNOSIS — M48062 Spinal stenosis, lumbar region with neurogenic claudication: Secondary | ICD-10-CM | POA: Diagnosis not present

## 2020-01-25 DIAGNOSIS — I10 Essential (primary) hypertension: Secondary | ICD-10-CM | POA: Diagnosis not present

## 2020-01-25 DIAGNOSIS — M4316 Spondylolisthesis, lumbar region: Secondary | ICD-10-CM | POA: Diagnosis not present

## 2020-01-29 DIAGNOSIS — M19042 Primary osteoarthritis, left hand: Secondary | ICD-10-CM | POA: Diagnosis not present

## 2020-01-29 DIAGNOSIS — I10 Essential (primary) hypertension: Secondary | ICD-10-CM | POA: Diagnosis not present

## 2020-01-29 DIAGNOSIS — Z4789 Encounter for other orthopedic aftercare: Secondary | ICD-10-CM | POA: Diagnosis not present

## 2020-01-29 DIAGNOSIS — M4316 Spondylolisthesis, lumbar region: Secondary | ICD-10-CM | POA: Diagnosis not present

## 2020-01-29 DIAGNOSIS — M19041 Primary osteoarthritis, right hand: Secondary | ICD-10-CM | POA: Diagnosis not present

## 2020-01-29 DIAGNOSIS — M47812 Spondylosis without myelopathy or radiculopathy, cervical region: Secondary | ICD-10-CM | POA: Diagnosis not present

## 2020-01-29 DIAGNOSIS — M069 Rheumatoid arthritis, unspecified: Secondary | ICD-10-CM | POA: Diagnosis not present

## 2020-01-29 DIAGNOSIS — M48062 Spinal stenosis, lumbar region with neurogenic claudication: Secondary | ICD-10-CM | POA: Diagnosis not present

## 2020-01-29 DIAGNOSIS — M170 Bilateral primary osteoarthritis of knee: Secondary | ICD-10-CM | POA: Diagnosis not present

## 2020-02-01 ENCOUNTER — Telehealth: Payer: Self-pay | Admitting: Hematology

## 2020-02-01 DIAGNOSIS — I10 Essential (primary) hypertension: Secondary | ICD-10-CM | POA: Diagnosis not present

## 2020-02-01 DIAGNOSIS — M19042 Primary osteoarthritis, left hand: Secondary | ICD-10-CM | POA: Diagnosis not present

## 2020-02-01 DIAGNOSIS — Z4789 Encounter for other orthopedic aftercare: Secondary | ICD-10-CM | POA: Diagnosis not present

## 2020-02-01 DIAGNOSIS — M48062 Spinal stenosis, lumbar region with neurogenic claudication: Secondary | ICD-10-CM | POA: Diagnosis not present

## 2020-02-01 DIAGNOSIS — M170 Bilateral primary osteoarthritis of knee: Secondary | ICD-10-CM | POA: Diagnosis not present

## 2020-02-01 DIAGNOSIS — M19041 Primary osteoarthritis, right hand: Secondary | ICD-10-CM | POA: Diagnosis not present

## 2020-02-01 DIAGNOSIS — M069 Rheumatoid arthritis, unspecified: Secondary | ICD-10-CM | POA: Diagnosis not present

## 2020-02-01 DIAGNOSIS — M47812 Spondylosis without myelopathy or radiculopathy, cervical region: Secondary | ICD-10-CM | POA: Diagnosis not present

## 2020-02-01 DIAGNOSIS — M4316 Spondylolisthesis, lumbar region: Secondary | ICD-10-CM | POA: Diagnosis not present

## 2020-02-01 NOTE — Telephone Encounter (Signed)
R/s per MD. Hulen Skains and spoke with pt, confirmed 4/23 appt. Mailing printout

## 2020-02-03 DIAGNOSIS — M47812 Spondylosis without myelopathy or radiculopathy, cervical region: Secondary | ICD-10-CM | POA: Diagnosis not present

## 2020-02-03 DIAGNOSIS — M19042 Primary osteoarthritis, left hand: Secondary | ICD-10-CM | POA: Diagnosis not present

## 2020-02-03 DIAGNOSIS — M4316 Spondylolisthesis, lumbar region: Secondary | ICD-10-CM | POA: Diagnosis not present

## 2020-02-03 DIAGNOSIS — I10 Essential (primary) hypertension: Secondary | ICD-10-CM | POA: Diagnosis not present

## 2020-02-03 DIAGNOSIS — M48062 Spinal stenosis, lumbar region with neurogenic claudication: Secondary | ICD-10-CM | POA: Diagnosis not present

## 2020-02-03 DIAGNOSIS — M19041 Primary osteoarthritis, right hand: Secondary | ICD-10-CM | POA: Diagnosis not present

## 2020-02-03 DIAGNOSIS — M170 Bilateral primary osteoarthritis of knee: Secondary | ICD-10-CM | POA: Diagnosis not present

## 2020-02-03 DIAGNOSIS — M069 Rheumatoid arthritis, unspecified: Secondary | ICD-10-CM | POA: Diagnosis not present

## 2020-02-03 DIAGNOSIS — Z4789 Encounter for other orthopedic aftercare: Secondary | ICD-10-CM | POA: Diagnosis not present

## 2020-02-04 DIAGNOSIS — M48062 Spinal stenosis, lumbar region with neurogenic claudication: Secondary | ICD-10-CM | POA: Diagnosis not present

## 2020-02-08 DIAGNOSIS — M48062 Spinal stenosis, lumbar region with neurogenic claudication: Secondary | ICD-10-CM | POA: Diagnosis not present

## 2020-02-08 DIAGNOSIS — M4316 Spondylolisthesis, lumbar region: Secondary | ICD-10-CM | POA: Diagnosis not present

## 2020-02-08 DIAGNOSIS — I10 Essential (primary) hypertension: Secondary | ICD-10-CM | POA: Diagnosis not present

## 2020-02-08 DIAGNOSIS — M19041 Primary osteoarthritis, right hand: Secondary | ICD-10-CM | POA: Diagnosis not present

## 2020-02-08 DIAGNOSIS — M47812 Spondylosis without myelopathy or radiculopathy, cervical region: Secondary | ICD-10-CM | POA: Diagnosis not present

## 2020-02-08 DIAGNOSIS — M069 Rheumatoid arthritis, unspecified: Secondary | ICD-10-CM | POA: Diagnosis not present

## 2020-02-08 DIAGNOSIS — Z4789 Encounter for other orthopedic aftercare: Secondary | ICD-10-CM | POA: Diagnosis not present

## 2020-02-08 DIAGNOSIS — M170 Bilateral primary osteoarthritis of knee: Secondary | ICD-10-CM | POA: Diagnosis not present

## 2020-02-08 DIAGNOSIS — M19042 Primary osteoarthritis, left hand: Secondary | ICD-10-CM | POA: Diagnosis not present

## 2020-02-10 DIAGNOSIS — R5383 Other fatigue: Secondary | ICD-10-CM | POA: Diagnosis not present

## 2020-02-10 DIAGNOSIS — M0579 Rheumatoid arthritis with rheumatoid factor of multiple sites without organ or systems involvement: Secondary | ICD-10-CM | POA: Diagnosis not present

## 2020-02-10 DIAGNOSIS — Z79899 Other long term (current) drug therapy: Secondary | ICD-10-CM | POA: Diagnosis not present

## 2020-02-12 ENCOUNTER — Ambulatory Visit: Payer: Medicare HMO | Admitting: Hematology

## 2020-02-12 ENCOUNTER — Other Ambulatory Visit: Payer: Medicare HMO

## 2020-02-15 DIAGNOSIS — M19041 Primary osteoarthritis, right hand: Secondary | ICD-10-CM | POA: Diagnosis not present

## 2020-02-15 DIAGNOSIS — M48062 Spinal stenosis, lumbar region with neurogenic claudication: Secondary | ICD-10-CM | POA: Diagnosis not present

## 2020-02-15 DIAGNOSIS — M170 Bilateral primary osteoarthritis of knee: Secondary | ICD-10-CM | POA: Diagnosis not present

## 2020-02-15 DIAGNOSIS — M19042 Primary osteoarthritis, left hand: Secondary | ICD-10-CM | POA: Diagnosis not present

## 2020-02-15 DIAGNOSIS — M47812 Spondylosis without myelopathy or radiculopathy, cervical region: Secondary | ICD-10-CM | POA: Diagnosis not present

## 2020-02-15 DIAGNOSIS — M069 Rheumatoid arthritis, unspecified: Secondary | ICD-10-CM | POA: Diagnosis not present

## 2020-02-15 DIAGNOSIS — I10 Essential (primary) hypertension: Secondary | ICD-10-CM | POA: Diagnosis not present

## 2020-02-15 DIAGNOSIS — M4316 Spondylolisthesis, lumbar region: Secondary | ICD-10-CM | POA: Diagnosis not present

## 2020-02-15 DIAGNOSIS — Z4789 Encounter for other orthopedic aftercare: Secondary | ICD-10-CM | POA: Diagnosis not present

## 2020-02-22 ENCOUNTER — Emergency Department (HOSPITAL_COMMUNITY): Payer: Medicare HMO

## 2020-02-22 ENCOUNTER — Encounter (HOSPITAL_COMMUNITY): Payer: Self-pay | Admitting: Emergency Medicine

## 2020-02-22 ENCOUNTER — Ambulatory Visit (INDEPENDENT_AMBULATORY_CARE_PROVIDER_SITE_OTHER): Payer: Medicare HMO | Admitting: Family Medicine

## 2020-02-22 ENCOUNTER — Other Ambulatory Visit: Payer: Self-pay

## 2020-02-22 ENCOUNTER — Encounter: Payer: Self-pay | Admitting: Family Medicine

## 2020-02-22 ENCOUNTER — Emergency Department (HOSPITAL_COMMUNITY)
Admission: EM | Admit: 2020-02-22 | Discharge: 2020-02-22 | Disposition: A | Discharge status: Home / Self Care | Payer: Medicare HMO | Attending: Emergency Medicine | Admitting: Emergency Medicine

## 2020-02-22 VITALS — BP 130/88 | HR 75 | Temp 98.3°F | Ht 66.0 in | Wt 223.0 lb

## 2020-02-22 DIAGNOSIS — R072 Precordial pain: Secondary | ICD-10-CM

## 2020-02-22 DIAGNOSIS — I4510 Unspecified right bundle-branch block: Secondary | ICD-10-CM | POA: Diagnosis not present

## 2020-02-22 DIAGNOSIS — R778 Other specified abnormalities of plasma proteins: Secondary | ICD-10-CM | POA: Diagnosis not present

## 2020-02-22 DIAGNOSIS — I10 Essential (primary) hypertension: Secondary | ICD-10-CM | POA: Diagnosis not present

## 2020-02-22 DIAGNOSIS — R079 Chest pain, unspecified: Secondary | ICD-10-CM | POA: Diagnosis not present

## 2020-02-22 DIAGNOSIS — Z79899 Other long term (current) drug therapy: Secondary | ICD-10-CM | POA: Diagnosis not present

## 2020-02-22 DIAGNOSIS — Z8572 Personal history of non-Hodgkin lymphomas: Secondary | ICD-10-CM | POA: Insufficient documentation

## 2020-02-22 DIAGNOSIS — R0789 Other chest pain: Secondary | ICD-10-CM | POA: Diagnosis not present

## 2020-02-22 DIAGNOSIS — Z87891 Personal history of nicotine dependence: Secondary | ICD-10-CM | POA: Insufficient documentation

## 2020-02-22 DIAGNOSIS — R0602 Shortness of breath: Secondary | ICD-10-CM | POA: Diagnosis not present

## 2020-02-22 LAB — CBC
HCT: 37.6 % — ABNORMAL LOW (ref 39.0–52.0)
Hemoglobin: 11.6 g/dL — ABNORMAL LOW (ref 13.0–17.0)
MCH: 30.4 pg (ref 26.0–34.0)
MCHC: 30.9 g/dL (ref 30.0–36.0)
MCV: 98.7 fL (ref 80.0–100.0)
Platelets: 239 10*3/uL (ref 150–400)
RBC: 3.81 MIL/uL — ABNORMAL LOW (ref 4.22–5.81)
RDW: 15.6 % — ABNORMAL HIGH (ref 11.5–15.5)
WBC: 4.4 10*3/uL (ref 4.0–10.5)
nRBC: 0 % (ref 0.0–0.2)

## 2020-02-22 LAB — TROPONIN I (HIGH SENSITIVITY)
Troponin I (High Sensitivity): 25 ng/L — ABNORMAL HIGH (ref <18)
Troponin I (High Sensitivity): 23 ng/L — ABNORMAL HIGH (ref <18)

## 2020-02-22 LAB — BASIC METABOLIC PANEL
Anion gap: 13 (ref 5–15)
BUN: 14 mg/dL (ref 8–23)
CO2: 25 mmol/L (ref 22–32)
Calcium: 8.8 mg/dL — ABNORMAL LOW (ref 8.9–10.3)
Chloride: 102 mmol/L (ref 98–111)
Creatinine, Ser: 1.26 mg/dL — ABNORMAL HIGH (ref 0.61–1.24)
GFR calc Af Amer: >60 mL/min (ref >60)
GFR calc non Af Amer: 55 mL/min — ABNORMAL LOW (ref >60)
Glucose, Bld: 92 mg/dL (ref 70–99)
Potassium: 3.4 mmol/L — ABNORMAL LOW (ref 3.5–5.1)
Sodium: 140 mmol/L (ref 135–145)

## 2020-02-22 LAB — PROTIME-INR
INR: 1 (ref 0.8–1.2)
Prothrombin Time: 13.5 seconds (ref 11.4–15.2)

## 2020-02-22 LAB — D-DIMER, QUANTITATIVE: D-Dimer, Quant: 6.84 ug/mL-FEU — ABNORMAL HIGH (ref 0.00–0.50)

## 2020-02-22 MED ORDER — SODIUM CHLORIDE 0.9% FLUSH: 3.0000 mL | Freq: Once | INTRAVENOUS | Status: DC

## 2020-02-22 MED ORDER — IOHEXOL 350 MG/ML SOLN
75.0000 mL | Freq: Once | INTRAVENOUS | Status: AC | PRN
  Administered 2020-02-22: 75 mL via INTRAVENOUS

## 2020-02-22 NOTE — ED Provider Notes (Signed)
[facility] EMERGENCY DEPARTMENT Provider Note   CSN: [id number] Arrival date & time: 02/22/20  0208     History Chief Complaint  Patient presents with  . Chest Pain    Bryan [name] is a 74 y.o. male.  HPI  HPI: A 74 year old patient with a history of hypertension presents for evaluation of chest pain. Initial onset of pain was approximately 3-6 hours ago. The patient's chest pain is described as heaviness/pressure/tightness and is not worse with exertion. The patient's chest pain is middle- or left-sided, is not well-localized, is not sharp and does radiate to the arms/jaw/neck. The patient does not complain of nausea and denies diaphoresis. The patient has no history of stroke, has no history of peripheral artery disease, has not smoked in the past 90 days, denies any history of treated diabetes, has no relevant family history of coronary artery disease (first degree relative at less than age 68), has no history of hypercholesterolemia and does not have an elevated BMI (>=30).  Patient w/history of hypertension presents with chest pain.  He reports waking up with substernal chest pressure that worsened with breathing.  He had mild shortness of breath.  The pain radiated to his neck.  No fevers or vomiting.  He has not had this recently.  No cough. Past Medical History:  Diagnosis Date  . Anemia   . Arthritis    hands, knees, cervical area. Back pain. Rheumatoid arthritis- weekly injections.  [name] BPH (benign prostatic hypertrophy)   . Cancer ([place])   . Essential tremor   . GERD (gastroesophageal reflux disease)    reports for indigestion he uses mustard   . Hearing deficit    wears hearing aids bilateral  . HTN (hypertension)   . Obesity   . Refusal of blood transfusions as patient is Jehovah's Witness   . Right bundle branch block    history of  . Seizures ([facility])    AS A CHILD. only esential tremors now.  . Sleep apnea    cpap - settings at 9 per patient   .  Ulcer     Patient Active Problem List   Diagnosis Date Noted  . Spondylolisthesis at L4-L5 level 12/25/2019  . Chest pain, rule out acute myocardial infarction 07/30/2018  . Elevated troponin   . Marginal zone lymphoma ([place]) 02/02/2018  . Lymphoma ([place]) 01/24/2018  . Parotid mass 10/28/2017  . Arthralgia of right lower leg 10/10/2017  . Class 2 severe obesity due to excess calories with serious comorbidity and body mass index (BMI) of 39.0 to 39.9 in adult ([place]) 03/07/2017  . ACE-inhibitor cough 03/07/2017  . Lumbago 06/21/2016  . Spinal stenosis, lumbar region, with neurogenic claudication 02/15/2015  . Obesity (BMI 30-39.9) 12/07/2013  . Refusal of blood transfusions as patient is Jehovah's Witness 12/07/2013  . Rheumatoid arthritis ([place]) 10/26/2012  . Nonspecific abnormal finding in stool contents 07/28/2012  . Multiple joint pain 06/05/2012  . History of tobacco use-  06/05/2012  . Asthmatic bronchitis 12/15/2011  . BPH (benign prostatic hyperplasia) 12/15/2011  . Essential tremor 12/15/2011  . Hypertension 12/15/2011    Past Surgical History:  Procedure Laterality Date  . ANTERIOR CERVICAL DECOMP/DISCECTOMY FUSION N/A 03/30/2013   Procedure: ANTERIOR CERVICAL DECOMPRESSION/DISCECTOMY FUSION 2 LEVELS;  Surgeon: Otilio Connors, MD;  Location: [facility];  Service: Neurosurgery;  Laterality: N/A;  C4-5 C5-6 Anterior cervical decompression/diskectomy/fusion/Allograft/Plate  . CHOLECYSTECTOMY N/A 12/07/2013   Procedure: LAPAROSCOPIC CHOLECYSTECTOMY WITH INTRAOPERATIVE CHOLANGIOGRAM;  Surgeon: Revonda Standard.  Gross, MD;  Location: [facility];  Service: General;  Laterality: N/A;  . COLONOSCOPY    . DECOMPRESSIVE LUMBAR LAMINECTOMY LEVEL 2 N/A 02/15/2015   Procedure: COMPLETE DECOMPRESSIVE LUMBAR LAMINECTOMY L4-L5/ FORAMINOTOMY TO L4 NERVE ROOT AND L5 NERVE ROOT BILATERALLY;  Surgeon: [name], MD;  Location: [facility];  Service: Orthopedics;  Laterality: N/A;  . EYE SURGERY     right,  growth excision  . LUMBAR LAMINECTOMY/DECOMPRESSION MICRODISCECTOMY Left 01/18/2016   Procedure:  DECOMPRESSION L4-L5 MICRODISCECTOMY L4-L5 ON LEFT FOR SPINAL STENOSIS;  Surgeon: [name], MD;  Location: [facility];  Service: Orthopedics;  Laterality: Left;  . PAROTIDECTOMY Left 01/24/2018   Procedure: INCISIONAL BIOPSY OF LEFT PAROTID    MASS;  Surgeon: Helayne Seminole, MD;  Location: [facility];  Service: ENT;  Laterality: Left;  . SPINE SURGERY    . TONSILLECTOMY    . VASECTOMY    . WRIST GANGLION EXCISION Left        Family History  Problem Relation Age of Onset  . Colon cancer Maternal Grandfather   . Kidney disease Brother   . Hypertension Brother   . Diabetes Brother   . Stroke Maternal Grandmother   . Diabetes Mother   . Hypertension Mother   . CAD Father        died of MI at age 55  . Hypertension Father   . Diabetes Paternal Aunt        x 4 aunts    Social History   Tobacco Use  . Smoking status: Former Smoker    Types: Cigarettes    Quit date: 12/14/1972    Years since quitting: 47.2  . Smokeless tobacco: Never Used  Substance Use Topics  . Alcohol use: Yes    Comment: occasionally  . Drug use: No    Home Medications Prior to Admission medications   Medication Sig Start Date End Date Taking? Authorizing Provider  atorvastatin (LIPITOR) 10 MG tablet Take 10 mg by mouth every evening.     [provider]  citalopram (CELEXA) 20 MG tablet Take 20 mg by mouth daily. 10/10/18   [provider]  ferrous sulfate 325 (65 FE) MG tablet Take 1 tablet (325 mg total) by mouth 2 (two) times daily with a meal. 12/27/19   Viona Gilmore D, NP  hydrochlorothiazide (HYDRODIURIL) 25 MG tablet Take 1 tablet (25 mg total) by mouth daily. 12/24/19   [name], MD  HYDROcodone-acetaminophen (NORCO/VICODIN) 5-325 MG tablet Take 1 tablet by mouth every 4 (four) hours as needed for moderate pain. 12/27/19 12/26/20  Viona Gilmore D, NP  meloxicam (MOBIC) 7.5  MG tablet Take 7.5 mg by mouth daily. 09/10/19   [provider]  methocarbamol (ROBAXIN) 500 MG tablet Take 1 tablet (500 mg total) by mouth every 6 (six) hours as needed for muscle spasms. 12/27/19   Viona Gilmore D, NP  methocarbamol (ROBAXIN) 750 MG tablet Take 750 mg by mouth 2 (two) times daily.     [provider]  metoprolol succinate (TOPROL-XL) 50 MG 24 hr tablet TAKE 1 TABLET BY MOUTH ONCE DAILY WITH  OR  IMMEDIATELY  FOLLOWING  A  MEAL 12/24/19   Delia Chimes A, MD  primidone (MYSOLINE) 50 MG tablet Take 50-100 mg by mouth See admin instructions. Take 2 tablets (100 mg) by mouth in the morning & take 1 tablet (50 mg) by mouth in the evening    [provider]  Tamsulosin HCl (FLOMAX) 0.4 MG CAPS  Take 0.5 mg by mouth at bedtime.     [provider]    Allergies    Lisinopril and Other  Review of Systems   Review of Systems  Constitutional: Negative for fever.  Respiratory: Positive for shortness of breath.   Cardiovascular: Positive for chest pain.  Gastrointestinal: Negative for vomiting.  All other systems reviewed and are negative.   Physical Exam Updated Vital Signs BP (!) 178/78   Pulse 74   Temp 98.7 F (37.1 C) (Oral)   Resp 14   Ht 1.676 m (5\' 6" )   Wt 110 kg   SpO2 96%   BMI 39.14 kg/m   Physical Exam CONSTITUTIONAL: Well developed/well nourished HEAD: Normocephalic/atraumatic EYES: EOMI/PERRL ENMT: Mucous membranes moist NECK: supple no meningeal signs SPINE/BACK:entire spine nontender CV: S1/S2 noted, no murmurs/rubs/gallops noted LUNGS: Lungs are clear to auscultation bilaterally, no apparent distress ABDOMEN: soft, nontender, no rebound or guarding, bowel sounds noted throughout abdomen GU:no cva tenderness NEURO: Pt is awake/alert/appropriate, moves all extremitiesx4.  No facial droop.  EXTREMITIES: pulses normal/equal, full ROM SKIN: warm, color normal PSYCH: no abnormalities of mood noted, alert and  oriented to situation  ED Results / Procedures / Treatments   Labs (all labs ordered are listed, but only abnormal results are displayed) Labs Reviewed  BASIC METABOLIC PANEL - Abnormal; Notable for the following components:      Result Value   Potassium 3.4 (*)    Creatinine, Ser 1.26 (*)    Calcium 8.8 (*)    GFR calc non Af Amer 55 (*)    All other components within normal limits  CBC - Abnormal; Notable for the following components:   RBC 3.81 (*)    Hemoglobin 11.6 (*)    HCT 37.6 (*)    RDW 15.6 (*)    All other components within normal limits  D-DIMER, QUANTITATIVE (NOT AT [facility]) - Abnormal; Notable for the following components:   D-Dimer, Quant 6.84 (*)    All other components within normal limits  TROPONIN I (HIGH SENSITIVITY) - Abnormal; Notable for the following components:   Troponin I (High Sensitivity) 25 (*)    All other components within normal limits  TROPONIN I (HIGH SENSITIVITY) - Abnormal; Notable for the following components:   Troponin I (High Sensitivity) 23 (*)    All other components within normal limits  PROTIME-INR    EKG EKG Interpretation  Date/Time:  Monday February 22 2020 02:12:38 EDT Ventricular Rate:  80 PR Interval:  146 QRS Duration: 124 QT Interval:  418 QTC Calculation: 482 R Axis:   0 Text Interpretation: Sinus rhythm with Premature atrial complexes Right bundle branch block Abnormal ECG No significant change since last tracing Confirmed by Ripley Fraise [phone]) on 02/22/2020 4:04:12 AM   Radiology DG Chest 2 View  Result Date: 02/22/2020 CLINICAL DATA:  Chest pain. EXAM: CHEST - 2 VIEW COMPARISON:  July 29, 2018 FINDINGS: The heart size and mediastinal contours are within normal limits. Both lungs are clear. A radiopaque fusion plate and screws are seen overlying the lower cervical spine. The visualized skeletal structures are unremarkable. Radiopaque surgical clips are seen overlying the right upper quadrant. IMPRESSION: No  active cardiopulmonary disease. Electronically Signed   By: Virgina Norfolk M.D.   On: 02/22/2020 03:10   CT Angio Chest PE W and/or Wo Contrast  Result Date: 02/22/2020 CLINICAL DATA:  Shortness of breath. Pain radiating into the left neck EXAM: CT ANGIOGRAPHY CHEST WITH CONTRAST TECHNIQUE: Multidetector CT imaging  of the chest was performed using the standard protocol during bolus administration of intravenous contrast. Multiplanar CT image reconstructions and MIPs were obtained to evaluate the vascular anatomy. CONTRAST:  34mL OMNIPAQUE IOHEXOL 350 MG/ML SOLN COMPARISON:  04/07/2013 chest CT. FINDINGS: Cardiovascular: Satisfactory opacification of the pulmonary arteries to the segmental level. No evidence of pulmonary embolism. Normal heart size. No pericardial effusion. Mediastinum/Nodes: No adenopathy or mass. Lungs/Pleura: Area of architectural distortion and ground-glass opacity in the posterior segment right upper lobe, unchanged since 2014 and attributed to scarring. Upper Abdomen: Splenic flexure diverticulosis. Musculoskeletal: Spondylosis. Review of the MIP images confirms the above findings. IMPRESSION: Negative for pulmonary embolism or other acute finding. Electronically Signed   By: Monte Fantasia M.D.   On: 02/22/2020 06:03    Procedures Procedures  Medications Ordered in ED Medications  sodium chloride flush (NS) 0.9 % injection 3 mL (has no administration in time range)  iohexol (OMNIPAQUE) 350 MG/ML injection 75 mL (75 mLs Intravenous Contrast Given 02/22/20 0533)    ED Course  I have reviewed the triage vital signs and the nursing notes.  Pertinent labs & imaging results that were available during my care of the patient were reviewed by me and considered in my medical decision making (see chart for details).    MDM Rules/Calculators/A&P HEAR Score: 5                    5:33 AM Patient reports the chest pain was worse with breathing.  D-dimer elevated.  We will proceed  with CT chest    This patient presents to the ED for concern of chest pain, this involves an extensive number of treatment options, and is a complaint that carries with it a high risk of complications and morbidity.  The differential diagnosis includes ACS, dissection, pericarditis, pulmonary embolism   Lab Tests:   I Ordered, reviewed, and interpreted labs, which included troponin, metabolic panel, complete blood count     Imaging Studies ordered:   I ordered imaging studies which included CT chest and chest x-ray  I independently visualized and interpreted imaging which showed no acute findings on chest x-ray, CT chest negative    Reevaluation:  After the interventions stated above, I reevaluated the patient and found patient is improved  .  Patient reports pain is resolved.  Extensive work-up reveals negative CT chest.  No acute EKG changes.  No significant troponin elevation.  Offered patient admission due to age and history.  After extensive discussion, patient declined and preferred To be discharged Patient actually has PCP follow-up later today.  We discussed strict ER return precautions Final Clinical Impression(s) / ED Diagnoses Final diagnoses:  Precordial pain    Rx / DC Orders ED Discharge Orders    None       Ripley Fraise, MD 02/22/20 [phone]

## 2020-02-22 NOTE — ED Notes (Signed)
Patient verbalizes understanding of discharge instructions. Opportunity for questioning and answers were provided. Armband removed by staff, pt discharged from ED. Pt. ambulatory and discharged home.  

## 2020-02-22 NOTE — Progress Notes (Signed)
Established Patient Office Visit  Subjective:  Patient ID: Bryan [name], male    DOB: 09/08/1946  Age: 74 y.o. MRN: [id number]  CC:  Chief Complaint  Patient presents with  . Hypertension  . ER follow up    precordial pain     HPI [name] presents for   Pt reports that he had two episodes of pain in the chest that was substernal and radiated to the left neck on 02/22/20 in the early morning around 2am.  He states that he had a big work up in the ER and had a CT to rule out PE, blood test and ECG He was advised to follow up with his PCP  He had a second episode that felt like a chest spasm at 6am Since that time he has felt better. Denies nausea, vomiting, sweats, the radiating pain has stopped.   Past Medical History:  Diagnosis Date  . Anemia   . Arthritis    hands, knees, cervical area. Back pain. Rheumatoid arthritis- weekly injections.  [name] BPH (benign prostatic hypertrophy)   . Cancer ([place])   . Essential tremor   . GERD (gastroesophageal reflux disease)    reports for indigestion he uses mustard   . Hearing deficit    wears hearing aids bilateral  . HTN (hypertension)   . Obesity   . Refusal of blood transfusions as patient is Jehovah's Witness   . Right bundle branch block    history of  . Seizures (HCC)    AS A CHILD. only esential tremors now.  . Sleep apnea    cpap - settings at 9 per patient   . Ulcer     Past Surgical History:  Procedure Laterality Date  . ANTERIOR CERVICAL DECOMP/DISCECTOMY FUSION N/A 03/30/2013   Procedure: ANTERIOR CERVICAL DECOMPRESSION/DISCECTOMY FUSION 2 LEVELS;  Surgeon: Otilio Connors, MD;  Location: [facility];  Service: Neurosurgery;  Laterality: N/A;  C4-5 C5-6 Anterior cervical decompression/diskectomy/fusion/Allograft/Plate  . CHOLECYSTECTOMY N/A 12/07/2013   Procedure: LAPAROSCOPIC CHOLECYSTECTOMY WITH INTRAOPERATIVE CHOLANGIOGRAM;  Surgeon: Adin Hector, MD;  Location: [facility];  Service: General;  Laterality:  N/A;  . COLONOSCOPY    . DECOMPRESSIVE LUMBAR LAMINECTOMY LEVEL 2 N/A 02/15/2015   Procedure: COMPLETE DECOMPRESSIVE LUMBAR LAMINECTOMY L4-L5/ FORAMINOTOMY TO L4 NERVE ROOT AND L5 NERVE ROOT BILATERALLY;  Surgeon: [name], MD;  Location: [facility];  Service: Orthopedics;  Laterality: N/A;  . EYE SURGERY     right, growth excision  . LUMBAR LAMINECTOMY/DECOMPRESSION MICRODISCECTOMY Left 01/18/2016   Procedure:  DECOMPRESSION L4-L5 MICRODISCECTOMY L4-L5 ON LEFT FOR SPINAL STENOSIS;  Surgeon: [name], MD;  Location: [facility];  Service: Orthopedics;  Laterality: Left;  . PAROTIDECTOMY Left 01/24/2018   Procedure: INCISIONAL BIOPSY OF LEFT PAROTID    MASS;  Surgeon: Helayne Seminole, MD;  Location: [facility];  Service: ENT;  Laterality: Left;  . SPINE SURGERY    . TONSILLECTOMY    . VASECTOMY    . WRIST GANGLION EXCISION Left     Family History  Problem Relation Age of Onset  . Colon cancer Maternal Grandfather   . Kidney disease Brother   . Hypertension Brother   . Diabetes Brother   . Stroke Maternal Grandmother   . Diabetes Mother   . Hypertension Mother   . CAD Father        died of MI at age 74  . Hypertension Father   . Diabetes Paternal Aunt  x 4 aunts    Social History   Socioeconomic History  . Marital status: Married    Spouse name: Not on file  . Number of children: 2  . Years of education: Not on file  . Highest education level: Not on file  Occupational History  . Occupation: bus Education administrator: RETIRED  Tobacco Use  . Smoking status: Former Smoker    Types: Cigarettes    Quit date: 12/14/1972    Years since quitting: 47.2  . Smokeless tobacco: Never Used  Substance and Sexual Activity  . Alcohol use: Yes    Comment: occasionally  . Drug use: No  . Sexual activity: Yes    Birth control/protection: None  Other Topics Concern  . Not on file  Social History Narrative  . Not on file   Social Determinants of Health   Financial Resource  Strain:   . Difficulty of Paying Living Expenses:   Food Insecurity:   . Worried About Charity fundraiser in the Last Year:   . Arboriculturist in the Last Year:   Transportation Needs:   . Film/video editor (Medical):   [name] Lack of Transportation (Non-Medical):   Physical Activity:   . Days of Exercise per Week:   . Minutes of Exercise per Session:   Stress:   . Feeling of Stress :   Social Connections:   . Frequency of Communication with Friends and Family:   . Frequency of Social Gatherings with Friends and Family:   . Attends Religious Services:   . Active Member of Clubs or Organizations:   . Attends Archivist Meetings:   [name] Marital Status:   Intimate Partner Violence:   . Fear of Current or Ex-Partner:   . Emotionally Abused:   [name] Physically Abused:   . Sexually Abused:     Outpatient Medications Prior to Visit  Medication Sig Dispense Refill  . atorvastatin (LIPITOR) 10 MG tablet Take 10 mg by mouth every evening.     . citalopram (CELEXA) 20 MG tablet Take 20 mg by mouth daily.    . ferrous sulfate 325 (65 FE) MG tablet Take 1 tablet (325 mg total) by mouth 2 (two) times daily with a meal. 14 tablet 0  . gabapentin (NEURONTIN) 300 MG capsule Take 300 mg by mouth at bedtime.    . hydrochlorothiazide (HYDRODIURIL) 25 MG tablet Take 1 tablet (25 mg total) by mouth daily. 90 tablet 0  . HYDROcodone-acetaminophen (NORCO/VICODIN) 5-325 MG tablet Take 1 tablet by mouth every 4 (four) hours as needed for moderate pain. 30 tablet 0  . meloxicam (MOBIC) 7.5 MG tablet Take 7.5 mg by mouth daily.    . methocarbamol (ROBAXIN) 750 MG tablet Take 750 mg by mouth 2 (two) times daily.     . metoprolol succinate (TOPROL-XL) 50 MG 24 hr tablet TAKE 1 TABLET BY MOUTH ONCE DAILY WITH  OR  IMMEDIATELY  FOLLOWING  A  MEAL 90 tablet 0  . primidone (MYSOLINE) 50 MG tablet Take 50-100 mg by mouth See admin instructions. Take 2 tablets (100 mg) by mouth in the morning & take 1 tablet  (50 mg) by mouth in the evening    . Tamsulosin HCl (FLOMAX) 0.4 MG CAPS Take 0.5 mg by mouth at bedtime.     . methocarbamol (ROBAXIN) 500 MG tablet Take 1 tablet (500 mg total) by mouth every 6 (six) hours as needed for muscle spasms. (Patient not taking:  Reported on 02/22/2020) 28 tablet 0   No facility-administered medications prior to visit.    Allergies  Allergen Reactions  . Lisinopril Other (See Comments) and Cough    Cough  . Other Other (See Comments)    BLOOD PRODUCT REFUSAL (patient is a Jehovah's Witness)     [name]    Review of Systems  Constitutional: Negative for activity change, appetite change, chills and fever.  HENT: Negative for congestion, nosebleeds, trouble swallowing and voice change.   Respiratory: Negative for cough, shortness of breath and wheezing.   Gastrointestinal: Negative for diarrhea, nausea and vomiting.  Genitourinary: Negative for difficulty urinating, dysuria, flank pain and hematuria.  Musculoskeletal: Negative for back pain, joint swelling and neck pain.  Neurological: Negative for dizziness, speech difficulty, light-headedness and numbness.  See HPI. All other review of systems negative.   Objective:    Physical Exam  BP 130/88   Pulse 75   Temp 98.3 F (36.8 C)   Ht 5\' 6"  (1.676 m)   Wt 223 lb (101.2 kg)   SpO2 97%   BMI 35.99 kg/m  Wt Readings from Last 3 Encounters:  02/22/20 223 lb (101.2 kg)  02/22/20 242 lb 8.1 oz (110 kg)  12/25/19 216 lb (98 kg)   Physical Exam  Constitutional: Oriented to person, place, and time. Appears well-developed and well-nourished.  HENT:  Head: Normocephalic and atraumatic.  Eyes: Conjunctivae and EOM are normal.  Cardiovascular: Normal rate, regular rhythm, normal heart sounds and intact distal pulses. No palpable thrills. No murmur heard. Pulmonary/Chest: Effort normal and breath sounds normal. No stridor. No respiratory distress. Has no wheezes.  Neurological: Is alert  and oriented to person, place, and time.  Skin: Skin is warm. Capillary refill takes less than 2 seconds.  Psychiatric: Has a normal mood and affect. Behavior is normal. Judgment and thought content normal.    ECG with sinus rhythm with PACs, RBBB CT PE protocol - negative for PE   Health Maintenance Due  Topic Date Due  . PNA vac Low Risk Adult (2 of 2 - PCV13) 02/12/2014    There are no preventive care reminders to display for this patient.  Lab Results  Component Value Date   TSH 1.070 10/31/2018   Lab Results  Component Value Date   WBC 4.4 02/22/2020   HGB 11.6 (L) 02/22/2020   HCT 37.6 (L) 02/22/2020   MCV 98.7 02/22/2020   PLT 239 02/22/2020   Lab Results  Component Value Date   NA 140 02/22/2020   K 3.4 (L) 02/22/2020   CO2 25 02/22/2020   GLUCOSE 92 02/22/2020   BUN 14 02/22/2020   CREATININE 1.26 (H) 02/22/2020   BILITOT 0.3 08/14/2019   ALKPHOS 156 (H) 08/14/2019   AST 20 08/14/2019   ALT 15 08/14/2019   PROT 7.2 08/14/2019   ALBUMIN 3.7 08/14/2019   CALCIUM 8.8 (L) 02/22/2020   ANIONGAP 13 02/22/2020   Lab Results  Component Value Date   CHOL 168 02/01/2017   Lab Results  Component Value Date   HDL 69 02/01/2017   Lab Results  Component Value Date   LDLCALC 86 02/01/2017   Lab Results  Component Value Date   TRIG 65 02/01/2017   Lab Results  Component Value Date   CHOLHDL 2.4 02/01/2017   Lab Results  Component Value Date   HGBA1C 5.5 10/31/2018      Assessment & Plan:   Problem List Items Addressed This Visit  Other   Elevated troponin    Other Visit Diagnoses    Precordial pain    -  Primary   Relevant Orders   Ambulatory referral to Cardiology   Radiating chest pain       RBBB         Will refer to Dr. Einar Gip for evaluation as an outpatient  Gave ER precautions Discussed stress testing as well as cardiac CT Pt is willing to endure full cardiac evaluation as he is anxious about his risk of heart disease given  his family history. Referral has been placed, CPM   No orders of the defined types were placed in this encounter.  A total of 25 minutes were spent face-to-face with the patient during this encounter and over half of that time was spent on counseling and coordination of care.  Follow-up: No follow-ups on file.    [name], MD

## 2020-02-22 NOTE — ED Triage Notes (Signed)
Patient reports central chest pain onset this evening with mild SOB , pain radiating to left neck , denies emesis or diaphoresis .

## 2020-02-22 NOTE — Patient Instructions (Addendum)
If you have lab work done today you will be contacted with your lab results within the next 2 weeks.  If you have not heard from [place] then please contact us. The fastest way to get your results is to register for My Chart.   IF you received an x-ray today, you will receive an invoice from [facility]. Please contact [facility] at [phone] with questions or concerns regarding your invoice.   IF you received labwork today, you will receive an invoice from [facility]. Please contact [facility] at [phone] with questions or concerns regarding your invoice.   Our billing staff will not be able to assist you with questions regarding bills from these companies.  You will be contacted with the lab results as soon as they are available. The fastest way to get your results is to activate your My Chart account. Instructions are located on the last page of this paperwork. If you have not heard from [place] regarding the results in 2 weeks, please contact this office.      Right Bundle Branch Block  Right bundle branch block (RBBB) is a problem with the way that electrical impulses pass through the heart (electrical conduction abnormality). The heart depends on an electrical pulse to beat normally. The electrical signal for a heartbeat starts in the upper chambers of the heart (atria) and then travels to the two lower chambers (left and rightventricles). An RBBB is a partial or complete block of the pathway that carries the signal to the right ventricle. If you have RBBB, the right side of your heart beats a little more slowly than the left side. RBBB may be a warning of heart disease or a lung problem. What are the causes? This condition may be caused by:  Heart attack (myocardial infarction).  Being born with a heart defect (congenital heart disease).  A blood clot that flows into the lung (pulmonary embolism).  Infection of heart muscle (myocarditis).  High blood  pressure. In some cases, the cause may not be known. What increases the risk? The following factors may make you more likely to develop this condition:  Being male.  Being 87 years of age or older.  Having heart disease.  Having had a heart attack or heart surgery.  Having an enlarged heart.  Having a hole in the walls between the chambers of the heart (septal defect). What are the signs or symptoms? This condition does not typically cause symptoms. How is this diagnosed? This condition may be diagnosed based on an electrocardiogram (ECG). It is often diagnosed when an ECG is done as part of a routine physical. You may also have imaging tests to find out more about your condition. These may include:  Chest X-rays.  Echocardiogram. How is this treated? Treatment may not be needed for this condition if you do not have symptoms or any other heart problems. However, you may need to see your health care provider more often because RBBB can be a warning sign of future heart or lung problems. You may need treatment for another condition that may be causing RBBB. Follow these instructions at home: Lifestyle   Follow instructions from your health care provider about eating or drinking restrictions.  Follow a heart-healthy diet and maintain a healthy weight. Work with a dietitian to create an eating plan that is best for you.  Do not use any products that contain nicotine or tobacco, such as cigarettes, e-cigarettes, and chewing tobacco. If you need help  quitting, ask your health care provider. Activity  Get regular exercise as told by your health care provider.  Return to your normal activities as told by your health care provider. Ask your health care provider what activities are safe for you. General instructions  Take over-the-counter and prescription medicines only as told by your health care provider.  Keep all follow-up visits as told by your health care provider. This is  important. Contact a health care provider if:  You are light-headed.  You faint. Get help right away if:  You have chest pain.  You have trouble breathing. These symptoms may represent a serious problem that is an emergency. Do not wait to see if the symptoms will go away. Get medical help right away. Call your local emergency services (911 in the [place] Do not drive yourself to the hospital.  Summary  For the heart to beat normally, an electrical signal must travel to the heart's lower right chamber. Right bundle branch block (RBBB) is a partial or complete block of the pathway that carries that signal.  This condition does not typically cause symptoms.  Treatment may not be needed for RBBB if you do not have symptoms or any other heart problems.  You may need to see your health care provider more often because RBBB can be a warning sign of future heart or lung problems. This information is not intended to replace advice given to you by your health care provider. Make sure you discuss any questions you have with your health care provider. Document Revised: 04/28/2019 Document Reviewed: 04/28/2019 Elsevier Patient Education  [place].

## 2020-02-22 NOTE — Discharge Instructions (Addendum)

## 2020-02-25 DIAGNOSIS — M0579 Rheumatoid arthritis with rheumatoid factor of multiple sites without organ or systems involvement: Secondary | ICD-10-CM | POA: Diagnosis not present

## 2020-03-01 DIAGNOSIS — E669 Obesity, unspecified: Secondary | ICD-10-CM | POA: Diagnosis not present

## 2020-03-01 DIAGNOSIS — M545 Low back pain: Secondary | ICD-10-CM | POA: Diagnosis not present

## 2020-03-01 DIAGNOSIS — Z6837 Body mass index (BMI) 37.0-37.9, adult: Secondary | ICD-10-CM | POA: Diagnosis not present

## 2020-03-01 DIAGNOSIS — C884 Extranodal marginal zone B-cell lymphoma of mucosa-associated lymphoid tissue [MALT-lymphoma]: Secondary | ICD-10-CM | POA: Diagnosis not present

## 2020-03-01 DIAGNOSIS — I7300 Raynaud's syndrome without gangrene: Secondary | ICD-10-CM | POA: Diagnosis not present

## 2020-03-01 DIAGNOSIS — M2550 Pain in unspecified joint: Secondary | ICD-10-CM | POA: Diagnosis not present

## 2020-03-01 DIAGNOSIS — M0579 Rheumatoid arthritis with rheumatoid factor of multiple sites without organ or systems involvement: Secondary | ICD-10-CM | POA: Diagnosis not present

## 2020-03-01 DIAGNOSIS — G8929 Other chronic pain: Secondary | ICD-10-CM | POA: Diagnosis not present

## 2020-03-03 NOTE — Progress Notes (Signed)
HEMATOLOGY ONCOLOGY CLINIC NOTE  Date of Service: 03/04/20    Patient Care Team: [name], MD as PCP - General (Internal Medicine) Croitoru, Dani Gobble, MD as PCP - Cardiology (Cardiology) [name] as Physician Assistant (Emergency Medicine) [name], MD as Consulting Physician (Orthopedic Surgery) [name], MD as Consulting Physician (Oncology) Helayne Seminole, MD as Consulting Physician (Otolaryngology) Edrick Kins, DPM as Consulting Physician (Podiatry)  CHIEF COMPLAINTS F/u for  Marginal Zone lymphoma    HISTORY OF PRESENTING ILLNESS:   Bryan [name] 74 y.o. male is here because of a referral from ENT [name] regarding a concern of MALT lymphoma.   He is accompanied today by 4 members of his family. The pt reports that he is doing well overall. The pt's PCP is [name]. The pt sees [name] for his Rheumatology. The pt went off of humira a month before his mass appeared two months. The pt takes Methotrexate 15 mg once each week for his rheumatoid arthritis. He notes that he took humira for 8-10 months, and hasn't taken it in about 3 months. He began taking prednisone 15mg  q day, 2 months ago.   He reports having high blood pressure and takes amlodipine, metoprolol for it. He reports having had back and neck surgery for discs.  He also notes having essential tremors in his hands for which he takes primidone.  He reports having run out of flomax. He has had some PTSD from his time in the [place] war.  He notes he has had drenching night sweats for the last 15 years that are of intermittent frequency. He denies any other medical issues.    He first noticed swelling of his left cheek about two months ago. He notes that it was "hard like a rock." He was placed on prednisone for about a month after going to the doctor, with some relief. He then had his needle Bx recorded below.   He reports gum pain that began this  morning, and also reports that his tongue feels as if it has bumps on it. He reports taking folic acid 1mg  each day.    On 10/29/17 the pt had a neck CT revealing Multifocal left parotid lesion. Multiple ill-defined enhancing nodules in the left parotid gland. Asymmetric enhancing lymph nodes in the left neck are not pathologically enlarged however given the asymmetry and the left parotid lesion, they could represent neoplastic spread of parotid malignancy or lymphoma. Tissue sampling recommended.  Of note prior to the patient's visit, pt has had a biopsy of his left parotid gland completed on 12/03/17 with results revealing Atypical lymphoid proliferation. 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 -   B cell clonality study was positive for clonality.  On review of systems, pt reports gum pain, fatigue (not recent), occasional night sweats, ankle pain, and denies abdominal pains, back pain, flank pain, leg swelling, swollen or painful joints beside ankles.   On PMHx the pt has had rheumatoid arthritis for 6 years and HTN. He notes that he has anemia and is a Acupuncturist Witness I- which he notes means he would not want to consider blood products  On Surgical Hx the pt had back surgery. On Social Hx the pt quit smoking in 1976 after smoking about 8 cigarettes each day for 15 years. He notes drinking ETOH about  once a week. He denies chemical or radiation exposure, except for agent orange.  On Family Hx the pt notes high blood pressure, DM, but denies autoimmune conditions, cancers, or blood disorders.    INTERVAL HISTORY:   Bryan [name] is here for a scheduled follow-up of his marginal zone lymphoma. The patient's last visit with [place] was on 08/14/19. The pt reports that he is doing well overall.  The pt reports he is fine. On Tuesday the pt had swelling on the  left side on the face that went away in about an hour. He is not on any new medications or dental issues. Pt had back surgery in February 12th and that has been troubling him since. He is taking plaquenil and Rituxan for his RA. Pt has had both doses of COVID19 vaccine.   Lab results today (03/04/20) of CBC w/diff and CMP is as follows: all values are WNL except for RBC at 3.69, Hemoglobin at 10.8, HCT at 34.6, Creatinine at 1.25, Calcium at 8.5, Albumin at 3.4, Alkaline Phosphatase at 166, GFR, Est Non Af Am at 56 03/04/20 of LDH at 206  On review of systems, pt denies new lumps/bumps, fatigue, abdominal pain and any other symptoms.   REVIEW OF SYSTEMS:   A 10+ POINT REVIEW OF SYSTEMS WAS OBTAINED including neurology, dermatology, psychiatry, cardiac, respiratory, lymph, extremities, GI, GU, Musculoskeletal, constitutional, breasts, reproductive, HEENT.  All pertinent positives are noted in the HPI.  All others are negative.   MEDICAL HISTORY:  Past Medical History:  Diagnosis Date  . Anemia   . Arthritis    hands, knees, cervical area. Back pain. Rheumatoid arthritis- weekly injections.  [name] BPH (benign prostatic hypertrophy)   . Cancer ([place])   . Essential tremor   . GERD (gastroesophageal reflux disease)    reports for indigestion he uses mustard   . Hearing deficit    wears hearing aids bilateral  . HTN (hypertension)   . Obesity   . Refusal of blood transfusions as patient is Jehovah's Witness   . Right bundle branch block    history of  . Seizures ([facility])    AS A CHILD. only esential tremors now.  . Sleep apnea    cpap - settings at 9 per patient   . Ulcer   JEHOVA's WITNESS  SURGICAL HISTORY: Past Surgical History:  Procedure Laterality Date  . ANTERIOR CERVICAL DECOMP/DISCECTOMY FUSION N/A 03/30/2013   Procedure: ANTERIOR CERVICAL DECOMPRESSION/DISCECTOMY FUSION 2 LEVELS;  Surgeon: Otilio Connors, MD;  Location: [facility];  Service: Neurosurgery;  Laterality: N/A;  C4-5  C5-6 Anterior cervical decompression/diskectomy/fusion/Allograft/Plate  . CHOLECYSTECTOMY N/A 12/07/2013   Procedure: LAPAROSCOPIC CHOLECYSTECTOMY WITH INTRAOPERATIVE CHOLANGIOGRAM;  Surgeon: Adin Hector, MD;  Location: [facility];  Service: General;  Laterality: N/A;  . COLONOSCOPY    . DECOMPRESSIVE LUMBAR LAMINECTOMY LEVEL 2 N/A 02/15/2015   Procedure: COMPLETE DECOMPRESSIVE LUMBAR LAMINECTOMY L4-L5/ FORAMINOTOMY TO L4 NERVE ROOT AND L5 NERVE ROOT BILATERALLY;  Surgeon: [name], MD;  Location: [facility];  Service: Orthopedics;  Laterality: N/A;  . EYE SURGERY     right, growth excision  . LUMBAR LAMINECTOMY/DECOMPRESSION MICRODISCECTOMY Left 01/18/2016   Procedure:  DECOMPRESSION L4-L5 MICRODISCECTOMY L4-L5 ON LEFT FOR SPINAL STENOSIS;  Surgeon: [name], MD;  Location: [facility];  Service: Orthopedics;  Laterality: Left;  . PAROTIDECTOMY Left 01/24/2018   Procedure: INCISIONAL BIOPSY OF LEFT PAROTID    MASS;  Surgeon: Helayne Seminole, MD;  Location: [facility];  Service: ENT;  Laterality: Left;  . SPINE SURGERY    . TONSILLECTOMY    . VASECTOMY    . WRIST GANGLION EXCISION Left     SOCIAL HISTORY: Social History   Socioeconomic History  . Marital status: Married    Spouse name: Not on file  . Number of children: 2  . Years of education: Not on file  . Highest education level: Not on file  Occupational History  . Occupation: bus Education administrator: RETIRED  Tobacco Use  . Smoking status: Former Smoker    Types: Cigarettes    Quit date: 12/14/1972    Years since quitting: 47.2  . Smokeless tobacco: Never Used  Substance and Sexual Activity  . Alcohol use: Yes    Comment: occasionally  . Drug use: No  . Sexual activity: Yes    Birth control/protection: None  Other Topics Concern  . Not on file  Social History Narrative  . Not on file   Social Determinants of Health   Financial Resource Strain:   . Difficulty of Paying Living Expenses:   Food Insecurity:   . Worried  About Charity fundraiser in the Last Year:   . Arboriculturist in the Last Year:   Transportation Needs:   . Film/video editor (Medical):   [name] Lack of Transportation (Non-Medical):   Physical Activity:   . Days of Exercise per Week:   . Minutes of Exercise per Session:   Stress:   . Feeling of Stress :   Social Connections:   . Frequency of Communication with Friends and Family:   . Frequency of Social Gatherings with Friends and Family:   . Attends Religious Services:   . Active Member of Clubs or Organizations:   . Attends Archivist Meetings:   [name] Marital Status:   Intimate Partner Violence:   . Fear of Current or Ex-Partner:   . Emotionally Abused:   [name] Physically Abused:   . Sexually Abused:     FAMILY HISTORY: Family History  Problem Relation Age of Onset  . Colon cancer Maternal Grandfather   . Kidney disease Brother   . Hypertension Brother   . Diabetes Brother   . Stroke Maternal Grandmother   . Diabetes Mother   . Hypertension Mother   . CAD Father        died of MI at age 3  . Hypertension Father   . Diabetes Paternal Aunt        x 4 aunts    ALLERGIES:  is allergic to lisinopril and other.  MEDICATIONS:  Current Outpatient Medications  Medication Sig Dispense Refill  . atorvastatin (LIPITOR) 10 MG tablet Take 10 mg by mouth every evening.     . citalopram (CELEXA) 20 MG tablet Take 20 mg by mouth daily.    . ferrous sulfate 325 (65 FE) MG tablet Take 1 tablet (325 mg total) by mouth 2 (two) times daily with a meal. 14 tablet 0  . gabapentin (NEURONTIN) 300 MG capsule Take 300 mg by mouth at bedtime.    . hydrochlorothiazide (HYDRODIURIL) 25 MG tablet Take 1 tablet (25 mg total) by mouth daily. 90 tablet 0  . HYDROcodone-acetaminophen (NORCO/VICODIN) 5-325 MG tablet Take 1 tablet by mouth every 4 (four) hours as needed for moderate pain. 30 tablet 0  . meloxicam (MOBIC) 7.5 MG tablet Take 7.5 mg by mouth daily.    . methocarbamol (ROBAXIN)  500 MG tablet Take 1 tablet (500  mg total) by mouth every 6 (six) hours as needed for muscle spasms. (Patient not taking: Reported on 02/22/2020) 28 tablet 0  . methocarbamol (ROBAXIN) 750 MG tablet Take 750 mg by mouth 2 (two) times daily.     . metoprolol succinate (TOPROL-XL) 50 MG 24 hr tablet TAKE 1 TABLET BY MOUTH ONCE DAILY WITH  OR  IMMEDIATELY  FOLLOWING  A  MEAL 90 tablet 0  . primidone (MYSOLINE) 50 MG tablet Take 50-100 mg by mouth See admin instructions. Take 2 tablets (100 mg) by mouth in the morning & take 1 tablet (50 mg) by mouth in the evening    . Tamsulosin HCl (FLOMAX) 0.4 MG CAPS Take 0.5 mg by mouth at bedtime.      No current facility-administered medications for this visit.    PHYSICAL EXAMINATION: ECOG FS:1 - Symptomatic but completely ambulatory  Vitals:   03/04/20 1200  BP: (!) 141/70  Pulse: 70  Resp: 18  Temp: 98 F (36.7 C)  SpO2: 96%   Wt Readings from Last 3 Encounters:  03/04/20 223 lb (101.2 kg)  02/22/20 223 lb (101.2 kg)  02/22/20 242 lb 8.1 oz (110 kg)   Body mass index is 35.99 kg/m.    GENERAL:alert, in no acute distress and comfortable SKIN: no acute rashes, no significant lesions EYES: conjunctiva are pink and non-injected, sclera anicteric OROPHARYNX: MMM, no exudates, no oropharyngeal erythema or ulceration NECK: supple, no JVD LYMPH:  no palpable lymphadenopathy in the cervical, axillary or inguinal regions LUNGS: clear to auscultation b/l with normal respiratory effort HEART: regular rate & rhythm ABDOMEN:  normoactive bowel sounds , non tender, not distended. Extremity: minimal pedal edema PSYCH: alert & oriented x 3 with fluent speech NEURO: no focal motor/sensory deficits  LABS CBC Latest Ref Rng & Units 03/04/2020 02/22/2020 12/27/2019  WBC 4.0 - 10.5 K/uL 4.5 4.4 8.6  Hemoglobin 13.0 - 17.0 g/dL 10.8(L) 11.6(L) 8.9(L)  Hematocrit 39.0 - 52.0 % 34.6(L) 37.6(L) 27.1(L)  Platelets 150 - 400 K/uL 257 239 148(L)   CBC     Component Value Date/Time   WBC 4.5 03/04/2020 1135   RBC 3.69 (L) 03/04/2020 1135   HGB 10.8 (L) 03/04/2020 1135   HGB 13.7 03/18/2018 0848   HCT 34.6 (L) 03/04/2020 1135   HCT 42.7 12/19/2017 1416   PLT 257 03/04/2020 1135   PLT 195 03/18/2018 0848   MCV 93.8 03/04/2020 1135   MCV 94.0 05/09/2018 1805   MCH 29.3 03/04/2020 1135   MCHC 31.2 03/04/2020 1135   RDW 14.9 03/04/2020 1135   LYMPHSABS 1.1 03/04/2020 1135   MONOABS 0.6 03/04/2020 1135   EOSABS 0.1 03/04/2020 1135   BASOSABS 0.0 03/04/2020 1135    CMP Latest Ref Rng & Units 03/04/2020 02/22/2020 12/17/2019  Glucose 70 - 99 mg/dL 80 92 83  BUN 8 - 23 mg/dL 18 14 18   Creatinine 0.61 - 1.24 mg/dL 1.25(H) 1.26(H) 1.19  Sodium 135 - 145 mmol/L 143 140 141  Potassium 3.5 - 5.1 mmol/L 3.7 3.4(L) 3.7  Chloride 98 - 111 mmol/L 105 102 103  CO2 22 - 32 mmol/L 28 25 29   Calcium 8.9 - 10.3 mg/dL 8.5(L) 8.8(L) 9.1  Total Protein 6.5 - 8.1 g/dL 7.2 - -  Total Bilirubin 0.3 - 1.2 mg/dL 0.3 - -  Alkaline Phos 38 - 126 U/L 166(H) - -  AST 15 - 41 U/L 16 - -  ALT 0 - 44 U/L 10 - -    Lab  Results  Component Value Date   LDH 206 (H) 03/04/2020    LABORATORY DATA:       01/27/18 Flow Cytometry:   01/27/18 Parotid Gland Surgical Pathology   RADIOGRAPHIC STUDIES: I have personally reviewed the radiological images as listed and agreed with the findings in the report. DG Chest 2 View  Result Date: 02/22/2020 CLINICAL DATA:  Chest pain. EXAM: CHEST - 2 VIEW COMPARISON:  July 29, 2018 FINDINGS: The heart size and mediastinal contours are within normal limits. Both lungs are clear. A radiopaque fusion plate and screws are seen overlying the lower cervical spine. The visualized skeletal structures are unremarkable. Radiopaque surgical clips are seen overlying the right upper quadrant. IMPRESSION: No active cardiopulmonary disease. Electronically Signed   By: Virgina Norfolk M.D.   On: 02/22/2020 03:10   CT Angio Chest PE W  and/or Wo Contrast  Result Date: 02/22/2020 CLINICAL DATA:  Shortness of breath. Pain radiating into the left neck EXAM: CT ANGIOGRAPHY CHEST WITH CONTRAST TECHNIQUE: Multidetector CT imaging of the chest was performed using the standard protocol during bolus administration of intravenous contrast. Multiplanar CT image reconstructions and MIPs were obtained to evaluate the vascular anatomy. CONTRAST:  25mL OMNIPAQUE IOHEXOL 350 MG/ML SOLN COMPARISON:  04/07/2013 chest CT. FINDINGS: Cardiovascular: Satisfactory opacification of the pulmonary arteries to the segmental level. No evidence of pulmonary embolism. Normal heart size. No pericardial effusion. Mediastinum/Nodes: No adenopathy or mass. Lungs/Pleura: Area of architectural distortion and ground-glass opacity in the posterior segment right upper lobe, unchanged since 2014 and attributed to scarring. Upper Abdomen: Splenic flexure diverticulosis. Musculoskeletal: Spondylosis. Review of the MIP images confirms the above findings. IMPRESSION: Negative for pulmonary embolism or other acute finding. Electronically Signed   By: Monte Fantasia M.D.   On: 02/22/2020 06:03    ASSESSMENT & PLAN:  Bryan [name] is a 74 y.o. male with    1.Recently diagnosed Stage IIE Marginal-zone lymphoma involving the left parotid gland, left cervical LN and mediastinal lymph nodes. . Lab Results  Component Value Date   LDH 206 (H) 03/04/2020   Hep C neg and Hep B -He completed 4 dose Rituxan 02/07/18-02/28/18.  LDH has normalized and the Parotid mass is no longer palpable.  04/22/18 PET/CT revealed complete metabolic response to therapy. No residual hypermetabolic mass or lymphadenopathy.   2. Jehovas Witness- declines any use of blood products.  3. Rheumatoid Arthritis  Rituxan seems to have helped his RA as well. - off  MTX and prednisone and is currently on plaquenil per his rheumatologist, Dr. Amil Amen and PA Leafy Kindle.  -I encouraged him to be more active  with at least walking and possibly start PT or Silver Sneakers program.  4. Low B12 levels -continue B12 SL 1062mcg SL daily  5. Wheezing - resolved currently PFTs 02/26/2018 - Pulmonary Function Diagnosis: Minimal Obstructive Airways Disease Minimal Restriction - Moderate Diffusion Defect   PLAN: -Discussed pt labwork today, 03/04/20; of CBC w/diff and CMP is as follows: all values are WNL except for RBC at 3.69, Hemoglobin at 10.8, HCT at 34.6, Creatinine at 1.25, Calcium at 8.5, Albumin at 3.4, Alkaline Phosphatase at 166, GFR, Est Non Af Am at 56 03/04/20 of LDH at 206 -Advised on swelling from left side of face -Advised RA could flare with surgery  -Continue to f/u with surgeon  -no lab or clinical evidence of lymphoma progression at this time. -continue f/u with rheumatology for continued Rituxan for RA which will likely also benefit lymphoma control. -  Will see back in 6 months   FOLLOW UP: RTC with Dr Irene Limbo with labs in 6 months  The total time spent in the appt was 20 minutes and more than 50% was on counseling and direct patient cares.  All of the patient's questions were answered with apparent satisfaction. The patient knows to call the clinic with any problems, questions or concerns.  Sullivan Lone MD [place] AAHIVMS [facility] Hematology/Oncology Physician [facility]  (Office):       [phone] (Work cell):  [phone] (Fax):           [phone]  I, Dawayne Cirri am acting as a scribe for Dr. Sullivan Lone.   .I have reviewed the above documentation for accuracy and completeness, and I agree with the above. [name] MD

## 2020-03-04 ENCOUNTER — Inpatient Hospital Stay: Payer: Medicare HMO | Admitting: Hematology

## 2020-03-04 ENCOUNTER — Other Ambulatory Visit: Payer: Self-pay

## 2020-03-04 ENCOUNTER — Telehealth: Payer: Self-pay | Admitting: Hematology

## 2020-03-04 ENCOUNTER — Inpatient Hospital Stay: Payer: Medicare HMO | Attending: Hematology

## 2020-03-04 VITALS — BP 141/70 | HR 70 | Temp 98.0°F | Resp 18 | Ht 66.0 in | Wt 223.0 lb

## 2020-03-04 DIAGNOSIS — I10 Essential (primary) hypertension: Secondary | ICD-10-CM | POA: Diagnosis not present

## 2020-03-04 DIAGNOSIS — C8588 Other specified types of non-Hodgkin lymphoma, lymph nodes of multiple sites: Secondary | ICD-10-CM | POA: Diagnosis not present

## 2020-03-04 DIAGNOSIS — N400 Enlarged prostate without lower urinary tract symptoms: Secondary | ICD-10-CM | POA: Diagnosis not present

## 2020-03-04 DIAGNOSIS — E669 Obesity, unspecified: Secondary | ICD-10-CM | POA: Insufficient documentation

## 2020-03-04 DIAGNOSIS — C8580 Other specified types of non-Hodgkin lymphoma, unspecified site: Secondary | ICD-10-CM

## 2020-03-04 DIAGNOSIS — G250 Essential tremor: Secondary | ICD-10-CM | POA: Diagnosis not present

## 2020-03-04 DIAGNOSIS — E538 Deficiency of other specified B group vitamins: Secondary | ICD-10-CM | POA: Insufficient documentation

## 2020-03-04 DIAGNOSIS — I4510 Unspecified right bundle-branch block: Secondary | ICD-10-CM | POA: Diagnosis not present

## 2020-03-04 DIAGNOSIS — R569 Unspecified convulsions: Secondary | ICD-10-CM | POA: Insufficient documentation

## 2020-03-04 DIAGNOSIS — R062 Wheezing: Secondary | ICD-10-CM | POA: Diagnosis not present

## 2020-03-04 DIAGNOSIS — M069 Rheumatoid arthritis, unspecified: Secondary | ICD-10-CM | POA: Insufficient documentation

## 2020-03-04 DIAGNOSIS — K219 Gastro-esophageal reflux disease without esophagitis: Secondary | ICD-10-CM | POA: Insufficient documentation

## 2020-03-04 DIAGNOSIS — F4310 Post-traumatic stress disorder, unspecified: Secondary | ICD-10-CM | POA: Insufficient documentation

## 2020-03-04 DIAGNOSIS — G4730 Sleep apnea, unspecified: Secondary | ICD-10-CM | POA: Diagnosis not present

## 2020-03-04 DIAGNOSIS — Z87891 Personal history of nicotine dependence: Secondary | ICD-10-CM | POA: Diagnosis not present

## 2020-03-04 DIAGNOSIS — Z79899 Other long term (current) drug therapy: Secondary | ICD-10-CM | POA: Insufficient documentation

## 2020-03-04 LAB — CBC WITH DIFFERENTIAL/PLATELET
Abs Immature Granulocytes: 0.01 10*3/uL (ref 0.00–0.07)
Basophils Absolute: 0 10*3/uL (ref 0.0–0.1)
Basophils Relative: 0 %
Eosinophils Absolute: 0.1 10*3/uL (ref 0.0–0.5)
Eosinophils Relative: 2 %
HCT: 34.6 % — ABNORMAL LOW (ref 39.0–52.0)
Hemoglobin: 10.8 g/dL — ABNORMAL LOW (ref 13.0–17.0)
Immature Granulocytes: 0 %
Lymphocytes Relative: 25 %
Lymphs Abs: 1.1 10*3/uL (ref 0.7–4.0)
MCH: 29.3 pg (ref 26.0–34.0)
MCHC: 31.2 g/dL (ref 30.0–36.0)
MCV: 93.8 fL (ref 80.0–100.0)
Monocytes Absolute: 0.6 10*3/uL (ref 0.1–1.0)
Monocytes Relative: 13 %
Neutro Abs: 2.7 10*3/uL (ref 1.7–7.7)
Neutrophils Relative %: 60 %
Platelets: 257 10*3/uL (ref 150–400)
RBC: 3.69 MIL/uL — ABNORMAL LOW (ref 4.22–5.81)
RDW: 14.9 % (ref 11.5–15.5)
WBC: 4.5 10*3/uL (ref 4.0–10.5)
nRBC: 0 % (ref 0.0–0.2)

## 2020-03-04 LAB — CMP (CANCER CENTER ONLY)
ALT: 10 U/L (ref 0–44)
AST: 16 U/L (ref 15–41)
Albumin: 3.4 g/dL — ABNORMAL LOW (ref 3.5–5.0)
Alkaline Phosphatase: 166 U/L — ABNORMAL HIGH (ref 38–126)
Anion gap: 10 (ref 5–15)
BUN: 18 mg/dL (ref 8–23)
CO2: 28 mmol/L (ref 22–32)
Calcium: 8.5 mg/dL — ABNORMAL LOW (ref 8.9–10.3)
Chloride: 105 mmol/L (ref 98–111)
Creatinine: 1.25 mg/dL — ABNORMAL HIGH (ref 0.61–1.24)
GFR, Est AFR Am: >60 mL/min (ref >60)
GFR, Estimated: 56 mL/min — ABNORMAL LOW (ref >60)
Glucose, Bld: 80 mg/dL (ref 70–99)
Potassium: 3.7 mmol/L (ref 3.5–5.1)
Sodium: 143 mmol/L (ref 135–145)
Total Bilirubin: 0.3 mg/dL (ref 0.3–1.2)
Total Protein: 7.2 g/dL (ref 6.5–8.1)

## 2020-03-04 LAB — LACTATE DEHYDROGENASE: LDH: 206 U/L — ABNORMAL HIGH (ref 98–192)

## 2020-03-04 NOTE — Telephone Encounter (Signed)
Scheduled per 04/23 los, patient received calender.

## 2020-03-11 ENCOUNTER — Ambulatory Visit: Payer: Self-pay | Admitting: Cardiology

## 2020-03-14 ENCOUNTER — Telehealth: Payer: Self-pay

## 2020-03-14 NOTE — Telephone Encounter (Signed)
Patient requesting medical records be released to [facility] representative. Promised to come into office on 03/15/2020 to sign the release form and also pick up printed copy of record.

## 2020-03-22 NOTE — Progress Notes (Signed)
Patient referred by [name], MD for precordial pain  Subjective:   Bryan [name], male    DOB: 13-Mar-1946, 74 y.o.   MRN: [id number]   Chief Complaint  Patient presents with  . percordial pain  . New Patient (Initial Visit)     HPI  74 year old African-American male with recently diagnosed stage IIb marginal zone lymphoma involving the left parotid gland, left cervical and mediastinal lymph nodes, rheumatoid arthritis, anemia, Jehovah's witness, referred for evaluation of chest pain.  Patient was evaluated by Dr. Sallyanne Kuster in 2019.  CT coronary angiogram at that time showed mild to moderate nonobstructive disease in LAD.  On 02/22/2020, patient went to [facility] emergency department with complaints of chest pain.  Episode of chest pain was retrosternal, burning, woke him up middle of night, and lasted for several hours. Patient went to [facility] ED. Workup showed CTA negative for PE. HS trop was mildly elevated but flat (25, 23). He has not had any recurrent chest pain since then. Patient is aware of extra beats that have been going on for a long time, and is unchanged. He does not do any regular physical activity at baseline.  Patient is on rituximab for rheumatoid arthritis and lymphoma, which has reportedly been stable.    Past Medical History:  Diagnosis Date  . Anemia   . Arthritis    hands, knees, cervical area. Back pain. Rheumatoid arthritis- weekly injections.  [name] BPH (benign prostatic hypertrophy)   . Cancer ([place])   . Essential tremor   . GERD (gastroesophageal reflux disease)    reports for indigestion he uses mustard   . Hearing deficit    wears hearing aids bilateral  . HTN (hypertension)   . Obesity   . Refusal of blood transfusions as patient is Jehovah's Witness   . Right bundle branch block    history of  . Seizures (HCC)    AS A CHILD. only esential tremors now.  . Sleep apnea    cpap - settings at 9 per patient   . Ulcer      Past  Surgical History:  Procedure Laterality Date  . ANTERIOR CERVICAL DECOMP/DISCECTOMY FUSION N/A 03/30/2013   Procedure: ANTERIOR CERVICAL DECOMPRESSION/DISCECTOMY FUSION 2 LEVELS;  Surgeon: Otilio Connors, MD;  Location: [facility];  Service: Neurosurgery;  Laterality: N/A;  C4-5 C5-6 Anterior cervical decompression/diskectomy/fusion/Allograft/Plate  . CHOLECYSTECTOMY N/A 12/07/2013   Procedure: LAPAROSCOPIC CHOLECYSTECTOMY WITH INTRAOPERATIVE CHOLANGIOGRAM;  Surgeon: Adin Hector, MD;  Location: [facility];  Service: General;  Laterality: N/A;  . COLONOSCOPY    . DECOMPRESSIVE LUMBAR LAMINECTOMY LEVEL 2 N/A 02/15/2015   Procedure: COMPLETE DECOMPRESSIVE LUMBAR LAMINECTOMY L4-L5/ FORAMINOTOMY TO L4 NERVE ROOT AND L5 NERVE ROOT BILATERALLY;  Surgeon: [name], MD;  Location: [facility];  Service: Orthopedics;  Laterality: N/A;  . EYE SURGERY     right, growth excision  . LUMBAR LAMINECTOMY/DECOMPRESSION MICRODISCECTOMY Left 01/18/2016   Procedure:  DECOMPRESSION L4-L5 MICRODISCECTOMY L4-L5 ON LEFT FOR SPINAL STENOSIS;  Surgeon: [name], MD;  Location: [facility];  Service: Orthopedics;  Laterality: Left;  . PAROTIDECTOMY Left 01/24/2018   Procedure: INCISIONAL BIOPSY OF LEFT PAROTID    MASS;  Surgeon: Helayne Seminole, MD;  Location: [facility];  Service: ENT;  Laterality: Left;  . SPINE SURGERY    . TONSILLECTOMY    . VASECTOMY    . WRIST GANGLION EXCISION Left      Social History   Tobacco Use  Smoking  Status Former Smoker  . Packs/day: 0.50  . Years: 14.00  . Pack years: 7.00  . Types: Cigarettes  . Quit date: 12/14/1972  . Years since quitting: 47.3  Smokeless Tobacco Never Used    Social History   Substance and Sexual Activity  Alcohol Use Yes   Comment: occasionally     Family History  Problem Relation Age of Onset  . Colon cancer Maternal Grandfather   . Kidney disease Brother   . Hypertension Brother   . Diabetes Brother   . Stroke Maternal Grandmother   . Diabetes  Mother   . Hypertension Mother   . CAD Father        died of MI at age 42  . Hypertension Father   . Diabetes Paternal Aunt        x 4 aunts     Current Outpatient Medications on File Prior to Visit  Medication Sig Dispense Refill  . atorvastatin (LIPITOR) 10 MG tablet Take 10 mg by mouth every evening.     . citalopram (CELEXA) 20 MG tablet Take 20 mg by mouth daily.    [name] gabapentin (NEURONTIN) 300 MG capsule Take 300 mg by mouth at bedtime.    . hydrochlorothiazide (HYDRODIURIL) 25 MG tablet Take 1 tablet (25 mg total) by mouth daily. 90 tablet 0  . HYDROcodone-acetaminophen (NORCO/VICODIN) 5-325 MG tablet Take 1 tablet by mouth every 4 (four) hours as needed for moderate pain. 30 tablet 0  . hydroxychloroquine (PLAQUENIL) 200 MG tablet     . meloxicam (MOBIC) 7.5 MG tablet Take 7.5 mg by mouth daily.    . methocarbamol (ROBAXIN) 500 MG tablet Take 1 tablet (500 mg total) by mouth every 6 (six) hours as needed for muscle spasms. 28 tablet 0  . metoprolol succinate (TOPROL-XL) 50 MG 24 hr tablet TAKE 1 TABLET BY MOUTH ONCE DAILY WITH  OR  IMMEDIATELY  FOLLOWING  A  MEAL 90 tablet 0  . oxycodone (OXY-IR) 5 MG capsule Take 5 mg by mouth every 6 (six) hours as needed.    . primidone (MYSOLINE) 50 MG tablet Take 50-100 mg by mouth See admin instructions. Take 2 tablets (100 mg) by mouth in the morning & take 1 tablet (50 mg) by mouth in the evening    . Tamsulosin HCl (FLOMAX) 0.4 MG CAPS Take 0.5 mg by mouth at bedtime.      No current facility-administered medications on file prior to visit.    Cardiovascular and other pertinent studies:  EKG 03/25/2020: Sinus rhythm 84 bpm Right bundle branch block.  Frequent PAC s   CTA chest 02/2020: Negative for PE  Coronary CT angiogram 07/2018: 1. Coronary calcium score of 21. This was 41 percentile for age and sex matched control. 2. Normal coronary origin with right dominance. 3. There is mild to moderate CAD in the proximal and mid  LAD. Aggressive risk factor modification is recommended.   Recent labs: 03/04/2020: Glucose 80, BUN/Cr 18/1.25. EGFR >60. Na/K 147/3.7.  H/H 10.8/34.6. MCV 93.8. Platelets 257 HbA1C N/A  2018: Chol 168, TG 65, HDL 69, LDL 86 TSH normal    Review of Systems  Cardiovascular: Positive for chest pain. Negative for dyspnea on exertion, leg swelling, palpitations and syncope.         Vitals:   03/25/20 1056  BP: (!) 150/82  Pulse: 77  Resp: 15  Temp: (!) 96.8 F (36 C)  SpO2: 97%     Body mass index is 36.32 kg/m.  Filed Weights   03/25/20 1056  Weight: 225 lb (102.1 kg)     Objective:   Physical Exam  Constitutional: No distress.  Neck: No JVD present.  Cardiovascular: Normal rate, regular rhythm, normal heart sounds and intact distal pulses.  No murmur heard. Pulmonary/Chest: Effort normal and breath sounds normal. He has no wheezes. He has no rales.  Musculoskeletal:        General: Edema (Minimal edema in b/l feet) present.  Nursing note and vitals reviewed.        Assessment & Recommendations:   74 year old African-American male with recently diagnosed stage IIb marginal zone lymphoma involving the left parotid gland, left cervical and mediastinal lymph nodes, rheumatoid arthritis, anemia, Jehovah's witness, referred for evaluation of chest pain.  Chest pain: Concern for episode of unstable angina. Known mild to mod prox-mid LAD (CTA cor 2019). Recommend medical conservative management first. Will obtain stress test for risk stratification. Increase metoprolol succinate to 100 mg daily. Use SL NTG as needed. Given his anemia, okay to hold off Aspirin until after stress test.   Hypertension: Uncontrolled. Increased metoprolol succinate.  F/u in 4 weeks     Thank you for referring the patient to [place]. Please feel free to contact with any questions.  Nigel Mormon, MD [facility]. [place] Pager: [phone] Office:  [phone]

## 2020-03-23 ENCOUNTER — Telehealth: Payer: Self-pay | Admitting: Hematology

## 2020-03-23 NOTE — Telephone Encounter (Signed)
Patient request, printed medical records to be faxed to [phone] attn: Bobbetta and patient will pick up a copy. Release ID: [id number]

## 2020-03-25 ENCOUNTER — Encounter: Payer: Self-pay | Admitting: Cardiology

## 2020-03-25 ENCOUNTER — Other Ambulatory Visit: Payer: Self-pay

## 2020-03-25 ENCOUNTER — Ambulatory Visit: Payer: Medicare HMO | Admitting: Cardiology

## 2020-03-25 VITALS — BP 150/82 | HR 77 | Temp 96.8°F | Resp 15 | Ht 66.0 in | Wt 225.0 lb

## 2020-03-25 DIAGNOSIS — R072 Precordial pain: Secondary | ICD-10-CM | POA: Diagnosis not present

## 2020-03-25 DIAGNOSIS — I10 Essential (primary) hypertension: Secondary | ICD-10-CM | POA: Diagnosis not present

## 2020-03-25 MED ORDER — NITROGLYCERIN 0.4 MG SL SUBL: 0.4000 mg | SUBLINGUAL_TABLET | SUBLINGUAL | 3 refills | Status: DC | PRN

## 2020-03-25 MED ORDER — METOPROLOL SUCCINATE ER 100 MG PO TB24: ORAL_TABLET | ORAL | 2 refills | Status: DC

## 2020-04-13 ENCOUNTER — Ambulatory Visit: Payer: Medicare HMO

## 2020-04-13 ENCOUNTER — Other Ambulatory Visit: Payer: Self-pay

## 2020-04-13 DIAGNOSIS — R072 Precordial pain: Secondary | ICD-10-CM | POA: Diagnosis not present

## 2020-04-14 DIAGNOSIS — M48062 Spinal stenosis, lumbar region with neurogenic claudication: Secondary | ICD-10-CM | POA: Diagnosis not present

## 2020-04-27 ENCOUNTER — Encounter: Payer: Self-pay | Admitting: Cardiology

## 2020-04-27 ENCOUNTER — Ambulatory Visit: Payer: Medicare HMO | Admitting: Cardiology

## 2020-04-27 ENCOUNTER — Other Ambulatory Visit: Payer: Self-pay

## 2020-04-27 ENCOUNTER — Ambulatory Visit: Payer: Medicare HMO

## 2020-04-27 VITALS — BP 140/58 | HR 68 | Resp 17 | Ht 66.0 in | Wt 223.0 lb

## 2020-04-27 DIAGNOSIS — R072 Precordial pain: Secondary | ICD-10-CM

## 2020-04-27 DIAGNOSIS — R9439 Abnormal result of other cardiovascular function study: Secondary | ICD-10-CM | POA: Diagnosis not present

## 2020-04-27 DIAGNOSIS — I499 Cardiac arrhythmia, unspecified: Secondary | ICD-10-CM | POA: Diagnosis not present

## 2020-04-27 DIAGNOSIS — I2510 Atherosclerotic heart disease of native coronary artery without angina pectoris: Secondary | ICD-10-CM | POA: Insufficient documentation

## 2020-04-27 DIAGNOSIS — I25118 Atherosclerotic heart disease of native coronary artery with other forms of angina pectoris: Secondary | ICD-10-CM | POA: Diagnosis not present

## 2020-04-27 DIAGNOSIS — I10 Essential (primary) hypertension: Secondary | ICD-10-CM

## 2020-04-27 MED ORDER — ATORVASTATIN CALCIUM 20 MG PO TABS: 10.0000 mg | ORAL_TABLET | Freq: Every evening | ORAL | 3 refills | Status: DC

## 2020-04-27 MED ORDER — ASPIRIN EC 81 MG PO TBEC: 81.0000 mg | DELAYED_RELEASE_TABLET | Freq: Every day | ORAL | 3 refills | Status: DC

## 2020-04-27 NOTE — Progress Notes (Signed)
Patient referred by [name], MD for precordial pain  Subjective:   Bryan [name], male    DOB: 1946-07-01, 74 y.o.   MRN: [id number]   Chief Complaint  Patient presents with  . percordil pain  . Follow-up    4 week     HPI  74 year old African-American male with recently diagnosed stage IIb marginal zone lymphoma involving the left parotid gland, left cervical and mediastinal lymph nodes, rheumatoid arthritis, anemia, Jehovah's witness, referred for evaluation of chest pain.  Stress test showed mild anteroseptal ischemia with EF 42%.   Patient has not had any chest pain since last visit. Blood pressure is better controlled. He states that he has had "irregular heart beat" for many years. He tells me that he has been taking meloxicam for many years, but not sure why. He had a colonoscopy long time back, but denies melena, hematochezia.    OV 03/2020: Patient was evaluated by Dr. Sallyanne Kuster in 2019.  CT coronary angiogram at that time showed mild to moderate nonobstructive disease in LAD.  On 02/22/2020, patient went to [facility] emergency department with complaints of chest pain.  Episode of chest pain was retrosternal, burning, woke him up middle of night, and lasted for several hours. Patient went to [facility] ED. Workup showed CTA negative for PE. HS trop was mildly elevated but flat (25, 23). He has not had any recurrent chest pain since then. Patient is aware of extra beats that have been going on for a long time, and is unchanged. He does not do any regular physical activity at baseline.  Patient is on rituximab for rheumatoid arthritis and lymphoma, which has reportedly been stable.    Current Outpatient Medications on File Prior to Visit  Medication Sig Dispense Refill  . atorvastatin (LIPITOR) 10 MG tablet Take 10 mg by mouth every evening.     . citalopram (CELEXA) 20 MG tablet Take 20 mg by mouth daily.    [name] gabapentin (NEURONTIN) 300 MG capsule Take 300 mg by  mouth at bedtime.    . hydrochlorothiazide (HYDRODIURIL) 25 MG tablet Take 1 tablet (25 mg total) by mouth daily. 90 tablet 0  . HYDROcodone-acetaminophen (NORCO/VICODIN) 5-325 MG tablet Take 1 tablet by mouth every 4 (four) hours as needed for moderate pain. 30 tablet 0  . hydroxychloroquine (PLAQUENIL) 200 MG tablet     . meloxicam (MOBIC) 7.5 MG tablet Take 7.5 mg by mouth daily.    . methocarbamol (ROBAXIN) 500 MG tablet Take 1 tablet (500 mg total) by mouth every 6 (six) hours as needed for muscle spasms. 28 tablet 0  . metoprolol succinate (TOPROL-XL) 100 MG 24 hr tablet TAKE 1 TABLET BY MOUTH ONCE DAILY WITH  OR  IMMEDIATELY  FOLLOWING  A  MEAL 30 tablet 2  . nitroGLYCERIN (NITROSTAT) 0.4 MG SL tablet Place 1 tablet (0.4 mg total) under the tongue every 5 (five) minutes as needed for chest pain. 90 tablet 3  . oxycodone (OXY-IR) 5 MG capsule Take 5 mg by mouth every 6 (six) hours as needed.    . Tamsulosin HCl (FLOMAX) 0.4 MG CAPS Take 0.5 mg by mouth at bedtime.      No current facility-administered medications on file prior to visit.    Cardiovascular and other pertinent studies:  Lexiscan Tetrofosmin stress test 04/13/2020: Lexiscan/modified Bruce nuclear stress test performed using 1-day protocol. Stress EKG is non-diagnostic, as this is pharmacological stress test. In addition, stress EKG at 67%  MPHR showed sinus tachycardia, RBBB, LAFB.  SPECT images show small sized, mild intensity, reversible perfusion defect in apical anteroseptal myocardium. Stress LVEF 42%. Intermediate risk study.   EKG 03/25/2020: Sinus rhythm 84 bpm Right bundle branch block.  Frequent PAC s   CTA chest 02/2020: Negative for PE  Coronary CT angiogram 07/2018: 1. Coronary calcium score of 21. This was 58 percentile for age and sex matched control. 2. Normal coronary origin with right dominance. 3. There is mild to moderate CAD in the proximal and mid LAD. Aggressive risk factor modification is  recommended.   Recent labs: 03/04/2020: Glucose 80, BUN/Cr 18/1.25. EGFR >60. Na/K 147/3.7.  H/H 10.8/34.6. MCV 93.8. Platelets 257 HbA1C N/A  2018: Chol 168, TG 65, HDL 69, LDL 86 TSH normal    Review of Systems  Cardiovascular: Positive for chest pain. Negative for dyspnea on exertion, leg swelling, palpitations and syncope.         Vitals:   04/27/20 1436  BP: (!) 140/58  Pulse: 68  Resp: 17  SpO2: 95%     Body mass index is 35.99 kg/m. Filed Weights   04/27/20 1436  Weight: 223 lb (101.2 kg)     Objective:   Physical Exam Vitals and nursing note reviewed.  Constitutional:      General: He is not in acute distress. Neck:     Vascular: No JVD.  Cardiovascular:     Rate and Rhythm: Normal rate and regular rhythm.     Pulses: Intact distal pulses.     Heart sounds: Normal heart sounds. No murmur heard.   Pulmonary:     Effort: Pulmonary effort is normal.     Breath sounds: Normal breath sounds. No wheezing or rales.          Assessment & Recommendations:   73 year old African-American male with recently diagnosed stage IIb marginal zone lymphoma involving the left parotid gland, left cervical and mediastinal lymph nodes, rheumatoid arthritis, anemia, Jehovah's witness, now with angina symptoms  CAD: Mild prox CAD CCTA 2019. Mild anteroseptal ischemia stress test 04/2020. In light of his controlled symptoms on metoprolol, continue the same. Stop meloxicam. Start aspirin 81 mg daily, take it with food. He has mild anemia and is Jehovah's witness. Would like to avoid dual antiplatelet therapy and intervention, if possible. Defer anemia workup to PCP  Hypertension: Better controlled  Irregular heart beat: Recommend check event monitor  F/u in 3 months  Lakia Gritton Esther Hardy, MD [facility]. [place] Pager: [phone] Office: [phone]

## 2020-04-28 DIAGNOSIS — R002 Palpitations: Secondary | ICD-10-CM | POA: Diagnosis not present

## 2020-05-10 ENCOUNTER — Other Ambulatory Visit: Payer: Self-pay

## 2020-05-10 DIAGNOSIS — I10 Essential (primary) hypertension: Secondary | ICD-10-CM

## 2020-05-10 DIAGNOSIS — R072 Precordial pain: Secondary | ICD-10-CM

## 2020-05-10 MED ORDER — METOPROLOL SUCCINATE ER 100 MG PO TB24: ORAL_TABLET | ORAL | 2 refills | Status: DC

## 2020-05-23 NOTE — Progress Notes (Signed)
Discussed the results with the patient.  Patient does not have any definite palpitation symptoms at this time.  Continue current medical management.  Nigel Mormon, MD

## 2020-06-16 DIAGNOSIS — M0579 Rheumatoid arthritis with rheumatoid factor of multiple sites without organ or systems involvement: Secondary | ICD-10-CM | POA: Diagnosis not present

## 2020-06-30 DIAGNOSIS — M0579 Rheumatoid arthritis with rheumatoid factor of multiple sites without organ or systems involvement: Secondary | ICD-10-CM | POA: Diagnosis not present

## 2020-06-30 DIAGNOSIS — M5416 Radiculopathy, lumbar region: Secondary | ICD-10-CM | POA: Diagnosis not present

## 2020-06-30 DIAGNOSIS — M48062 Spinal stenosis, lumbar region with neurogenic claudication: Secondary | ICD-10-CM | POA: Diagnosis not present

## 2020-07-22 ENCOUNTER — Other Ambulatory Visit: Payer: Self-pay | Admitting: Neurosurgery

## 2020-07-22 DIAGNOSIS — M48062 Spinal stenosis, lumbar region with neurogenic claudication: Secondary | ICD-10-CM

## 2020-08-04 ENCOUNTER — Other Ambulatory Visit: Payer: Medicare HMO

## 2020-08-05 ENCOUNTER — Ambulatory Visit: Payer: Medicare Other | Admitting: Cardiology

## 2020-08-05 ENCOUNTER — Other Ambulatory Visit: Payer: Self-pay

## 2020-08-05 ENCOUNTER — Encounter: Payer: Self-pay | Admitting: Cardiology

## 2020-08-05 VITALS — BP 141/72 | HR 66 | Resp 16 | Ht 66.0 in | Wt 229.0 lb

## 2020-08-05 DIAGNOSIS — I10 Essential (primary) hypertension: Secondary | ICD-10-CM

## 2020-08-05 DIAGNOSIS — I2510 Atherosclerotic heart disease of native coronary artery without angina pectoris: Secondary | ICD-10-CM

## 2020-08-05 NOTE — Progress Notes (Signed)
Patient referred by [name], MD for precordial pain  Subjective:   Bryan [name], male    DOB: [birth year], 74 y.o.   MRN: [id number]   Chief Complaint  Patient presents with  . Precordial pain  . Follow-up    3 month     HPI  74 year old African-American male with stage IIb marginal zone lymphoma involving the left parotid gland, left cervical and mediastinal lymph nodes, rheumatoid arthritis, anemia, Jehovah's witness, CAD with mild ischemia (04/2020)  Patient is doing well. He denies chest pain, shortness of breath, palpitations, leg edema, orthopnea, PND, TIA/syncope.   OV 03/2020: Patient was evaluated by Dr. Sallyanne Kuster in 2019.  CT coronary angiogram at that time showed mild to moderate nonobstructive disease in LAD.  On 02/22/2020, patient went to [facility] emergency department with complaints of chest pain.  Episode of chest pain was retrosternal, burning, woke him up middle of night, and lasted for several hours. Patient went to [facility] ED. Workup showed CTA negative for PE. HS trop was mildly elevated but flat (25, 23). He has not had any recurrent chest pain since then. Patient is aware of extra beats that have been going on for a long time, and is unchanged. He does not do any regular physical activity at baseline.  Patient is on rituximab for rheumatoid arthritis and lymphoma, which has reportedly been stable.    Current Outpatient Medications on File Prior to Visit  Medication Sig Dispense Refill  . aspirin EC 81 MG tablet Take 1 tablet (81 mg total) by mouth daily. Swallow whole. 90 tablet 3  . atorvastatin (LIPITOR) 20 MG tablet Take 0.5 tablets (10 mg total) by mouth every evening. 90 tablet 3  . citalopram (CELEXA) 20 MG tablet Take 20 mg by mouth daily.    [name] gabapentin (NEURONTIN) 300 MG capsule Take 300 mg by mouth at bedtime.    . hydrochlorothiazide (HYDRODIURIL) 25 MG tablet Take 1 tablet (25 mg total) by mouth daily. 90 tablet 0  .  HYDROcodone-acetaminophen (NORCO/VICODIN) 5-325 MG tablet Take 1 tablet by mouth every 4 (four) hours as needed for moderate pain. 30 tablet 0  . hydroxychloroquine (PLAQUENIL) 200 MG tablet     . methocarbamol (ROBAXIN) 500 MG tablet Take 1 tablet (500 mg total) by mouth every 6 (six) hours as needed for muscle spasms. 28 tablet 0  . metoprolol succinate (TOPROL-XL) 100 MG 24 hr tablet TAKE 1 TABLET BY MOUTH ONCE DAILY WITH  OR  IMMEDIATELY  FOLLOWING  A  MEAL 90 tablet 2  . nitroGLYCERIN (NITROSTAT) 0.4 MG SL tablet Place 1 tablet (0.4 mg total) under the tongue every 5 (five) minutes as needed for chest pain. 90 tablet 3  . oxycodone (OXY-IR) 5 MG capsule Take 5 mg by mouth every 6 (six) hours as needed.    . Tamsulosin HCl (FLOMAX) 0.4 MG CAPS Take 0.5 mg by mouth at bedtime.      No current facility-administered medications on file prior to visit.    Cardiovascular and other pertinent studies:  Event monitor 04/28/2020 - 05/11/2020: Diagnostic time: 74%  Dominant rhythm: Sinus. HR 52-109 bpm. Avg HR 68 bpm. 5 beat NSVT at 2:44 am CT on 04/28/2020.  Recurring episodes of atrial tachycardia <30 sec on 05/06/2020 10:14 AM CT No atrial fibrillation/atrial flutter/SVT/VT/high grade AV block, sinus pause >3sec noted. Symptoms reported: None  Lexiscan Tetrofosmin stress test 04/13/2020: Lexiscan/modified Bruce nuclear stress test performed using 1-day protocol. Stress EKG is  non-diagnostic, as this is pharmacological stress test. In addition, stress EKG at 67% MPHR showed sinus tachycardia, RBBB, LAFB.  SPECT images show small sized, mild intensity, reversible perfusion defect in apical anteroseptal myocardium. Stress LVEF 42%. Intermediate risk study.   EKG 03/25/2020: Sinus rhythm 84 bpm Right bundle branch block.  Frequent PAC s   CTA chest 02/2020: Negative for PE  Coronary CT angiogram 07/2018: 1. Coronary calcium score of 21. This was 52 percentile for age and sex matched  control. 2. Normal coronary origin with right dominance. 3. There is mild to moderate CAD in the proximal and mid LAD. Aggressive risk factor modification is recommended.   Recent labs: 03/04/2020: Glucose 80, BUN/Cr 18/1.25. EGFR >60. Na/K 147/3.7.  H/H 10.8/34.6. MCV 93.8. Platelets 257 HbA1C N/A  2018: Chol 168, TG 65, HDL 69, LDL 86 TSH normal    Review of Systems  Cardiovascular: Positive for chest pain. Negative for dyspnea on exertion, leg swelling, palpitations and syncope.        Vitals:   08/05/20 1435  BP: (!) 141/72  Pulse: 66  Resp: 16  SpO2: 97%     Body mass index is 36.96 kg/m. Filed Weights   08/05/20 1435  Weight: 229 lb (103.9 kg)     Objective:   Physical Exam Vitals and nursing note reviewed.  Constitutional:      General: He is not in acute distress. Neck:     Vascular: No JVD.  Cardiovascular:     Rate and Rhythm: Normal rate and regular rhythm.     Pulses: Intact distal pulses.     Heart sounds: Normal heart sounds. No murmur heard.   Pulmonary:     Effort: Pulmonary effort is normal.     Breath sounds: Normal breath sounds. No wheezing or rales.          Assessment & Recommendations:   74 year old African-American male with stage IIb marginal zone lymphoma involving the left parotid gland, left cervical and mediastinal lymph nodes, rheumatoid arthritis, anemia, Jehovah's witness, CAD with mild ischemia (04/2020)  CAD: Mild prox CAD CCTA 2019. Mild anteroseptal ischemia stress test 04/2020. In light of his controlled symptoms on metoprolol, continue the same. Continue aspirin 81 mg daily, take it with food. He has mild anemia and is Jehovah's witness. Would like to avoid dual antiplatelet therapy and intervention, if possible. Defer anemia workup to PCP  Hypertension: Better controlled  Irregular heart beat: No Afib seen on 2 week monitor.  F/u in 6 months  Krissy Orebaugh Esther Hardy, MD [facility].  PA Pager: [phone] Office: [phone]

## 2020-08-18 ENCOUNTER — Ambulatory Visit
Admission: RE | Admit: 2020-08-18 | Discharge: 2020-08-18 | Disposition: A | Discharge status: Home / Self Care | Payer: Medicare Other | Source: Ambulatory Visit | Attending: Neurosurgery | Admitting: Neurosurgery

## 2020-08-18 DIAGNOSIS — M48062 Spinal stenosis, lumbar region with neurogenic claudication: Secondary | ICD-10-CM

## 2020-09-02 ENCOUNTER — Inpatient Hospital Stay: Payer: Medicare Other | Attending: Hematology

## 2020-09-02 ENCOUNTER — Other Ambulatory Visit: Payer: Self-pay

## 2020-09-02 ENCOUNTER — Inpatient Hospital Stay: Payer: Medicare Other | Admitting: Hematology

## 2020-09-02 VITALS — BP 150/72 | HR 69 | Temp 97.7°F | Resp 18 | Ht 66.0 in | Wt 226.9 lb

## 2020-09-02 DIAGNOSIS — C884 Extranodal marginal zone B-cell lymphoma of mucosa-associated lymphoid tissue [MALT-lymphoma]: Secondary | ICD-10-CM | POA: Insufficient documentation

## 2020-09-02 DIAGNOSIS — M069 Rheumatoid arthritis, unspecified: Secondary | ICD-10-CM | POA: Insufficient documentation

## 2020-09-02 DIAGNOSIS — E538 Deficiency of other specified B group vitamins: Secondary | ICD-10-CM

## 2020-09-02 DIAGNOSIS — C8580 Other specified types of non-Hodgkin lymphoma, unspecified site: Secondary | ICD-10-CM | POA: Diagnosis not present

## 2020-09-02 DIAGNOSIS — Z87891 Personal history of nicotine dependence: Secondary | ICD-10-CM | POA: Insufficient documentation

## 2020-09-02 LAB — CBC WITH DIFFERENTIAL/PLATELET
Abs Immature Granulocytes: 0.01 10*3/uL (ref 0.00–0.07)
Basophils Absolute: 0 10*3/uL (ref 0.0–0.1)
Basophils Relative: 0 %
Eosinophils Absolute: 0.1 10*3/uL (ref 0.0–0.5)
Eosinophils Relative: 2 %
HCT: 36.4 % — ABNORMAL LOW (ref 39.0–52.0)
Hemoglobin: 11.6 g/dL — ABNORMAL LOW (ref 13.0–17.0)
Immature Granulocytes: 0 %
Lymphocytes Relative: 17 %
Lymphs Abs: 1 10*3/uL (ref 0.7–4.0)
MCH: 29.7 pg (ref 26.0–34.0)
MCHC: 31.9 g/dL (ref 30.0–36.0)
MCV: 93.3 fL (ref 80.0–100.0)
Monocytes Absolute: 0.6 10*3/uL (ref 0.1–1.0)
Monocytes Relative: 10 %
Neutro Abs: 4.2 10*3/uL (ref 1.7–7.7)
Neutrophils Relative %: 71 %
Platelets: 212 10*3/uL (ref 150–400)
RBC: 3.9 MIL/uL — ABNORMAL LOW (ref 4.22–5.81)
RDW: 14.9 % (ref 11.5–15.5)
WBC: 5.9 10*3/uL (ref 4.0–10.5)
nRBC: 0 % (ref 0.0–0.2)

## 2020-09-02 LAB — CMP (CANCER CENTER ONLY)
ALT: 9 U/L (ref 0–44)
AST: 18 U/L (ref 15–41)
Albumin: 3.6 g/dL (ref 3.5–5.0)
Alkaline Phosphatase: 160 U/L — ABNORMAL HIGH (ref 38–126)
Anion gap: 7 (ref 5–15)
BUN: 22 mg/dL (ref 8–23)
CO2: 28 mmol/L (ref 22–32)
Calcium: 8.9 mg/dL (ref 8.9–10.3)
Chloride: 104 mmol/L (ref 98–111)
Creatinine: 1.25 mg/dL — ABNORMAL HIGH (ref 0.61–1.24)
GFR, Estimated: >60 mL/min (ref >60)
Glucose, Bld: 83 mg/dL (ref 70–99)
Potassium: 3.8 mmol/L (ref 3.5–5.1)
Sodium: 139 mmol/L (ref 135–145)
Total Bilirubin: 0.4 mg/dL (ref 0.3–1.2)
Total Protein: 7.3 g/dL (ref 6.5–8.1)

## 2020-09-02 LAB — VITAMIN B12: Vitamin B-12: 201 pg/mL (ref 180–914)

## 2020-09-02 LAB — FERRITIN: Ferritin: 19 ng/mL — ABNORMAL LOW (ref 24–336)

## 2020-09-02 NOTE — Progress Notes (Signed)
HEMATOLOGY ONCOLOGY CLINIC NOTE  Date of Service: 09/02/20    Patient Care Team: [name], MD as PCP - General (Internal Medicine) Croitoru, Dani Gobble, MD as PCP - Cardiology (Cardiology) [name] as Physician Assistant (Emergency Medicine) [name], MD as Consulting Physician (Orthopedic Surgery) [name], MD as Consulting Physician (Oncology) Helayne Seminole, MD as Consulting Physician (Otolaryngology) Edrick Kins, DPM as Consulting Physician (Podiatry)  CHIEF COMPLAINTS F/u for  Marginal Zone lymphoma    HISTORY OF PRESENTING ILLNESS:   Bryan [name] 74 y.o. male is here because of a referral from ENT [name] regarding a concern of MALT lymphoma.   He is accompanied today by 4 members of his family. The pt reports that he is doing well overall. The pt's PCP is [name]. The pt sees [name] for his Rheumatology. The pt went off of humira a month before his mass appeared two months. The pt takes Methotrexate 15 mg once each week for his rheumatoid arthritis. He notes that he took humira for 8-10 months, and hasn't taken it in about 3 months. He began taking prednisone 15mg  q day, 2 months ago.   He reports having high blood pressure and takes amlodipine, metoprolol for it. He reports having had back and neck surgery for discs.  He also notes having essential tremors in his hands for which he takes primidone.  He reports having run out of flomax. He has had some PTSD from his time in the [place] war.  He notes he has had drenching night sweats for the last 15 years that are of intermittent frequency. He denies any other medical issues.    He first noticed swelling of his left cheek about two months ago. He notes that it was "hard like a rock." He was placed on prednisone for about a month after going to the doctor, with some relief. He then had his needle Bx recorded below.   He reports gum pain that began this  morning, and also reports that his tongue feels as if it has bumps on it. He reports taking folic acid 1mg  each day.    On 10/29/17 the pt had a neck CT revealing Multifocal left parotid lesion. Multiple ill-defined enhancing nodules in the left parotid gland. Asymmetric enhancing lymph nodes in the left neck are not pathologically enlarged however given the asymmetry and the left parotid lesion, they could represent neoplastic spread of parotid malignancy or lymphoma. Tissue sampling recommended.  Of note prior to the patient's visit, pt has had a biopsy of his left parotid gland completed on 12/03/17 with results revealing Atypical lymphoid proliferation. 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 -   B cell clonality study was positive for clonality.  On review of systems, pt reports gum pain, fatigue (not recent), occasional night sweats, ankle pain, and denies abdominal pains, back pain, flank pain, leg swelling, swollen or painful joints beside ankles.   On PMHx the pt has had rheumatoid arthritis for 6 years and HTN. He notes that he has anemia and is a Acupuncturist Witness I- which he notes means he would not want to consider blood products  On Surgical Hx the pt had back surgery. On Social Hx the pt quit smoking in 1976 after smoking about 8 cigarettes each day for 15 years. He notes drinking ETOH about  once a week. He denies chemical or radiation exposure, except for agent orange.  On Family Hx the pt notes high blood pressure, DM, but denies autoimmune conditions, cancers, or blood disorders.    INTERVAL HISTORY:   Bryan [name] is here for a scheduled follow-up of his marginal zone lymphoma. The patient's last visit with [place] was on 03/04/2020. The pt reports that he is doing well overall.  The pt reports that he continues Rituxan infusions every 13 weeks.  He feels that this is controlling his Rheumatoid arthritis well. Pt reports right knee pain and swelling for the last week.   Lab results today (09/02/20) of CBC w/diff and CMP is as follows: all values are WNL except for RBC at 3.90, Hgb at 11.6, HCT at 36.4, Creatinine at 1.25, ALP at 160. 09/02/2020 Vitamin B12 at 201 09/02/2020 Ferritin at 19  On review of systems, pt reports right knee pain, chronic back pain and denies fevers, chills, night sweats, unexpected weight loss, new lumps/bumps, other joint pain and any other symptoms.    REVIEW OF SYSTEMS:   A 10+ POINT REVIEW OF SYSTEMS WAS OBTAINED including neurology, dermatology, psychiatry, cardiac, respiratory, lymph, extremities, GI, GU, Musculoskeletal, constitutional, breasts, reproductive, HEENT.  All pertinent positives are noted in the HPI.  All others are negative.   MEDICAL HISTORY:  Past Medical History:  Diagnosis Date  . Anemia   . Arthritis    hands, knees, cervical area. Back pain. Rheumatoid arthritis- weekly injections.  [name] BPH (benign prostatic hypertrophy)   . Cancer ([facility])   . Essential tremor   . GERD (gastroesophageal reflux disease)    reports for indigestion he uses mustard   . Hearing deficit    wears hearing aids bilateral  . HTN (hypertension)   . Obesity   . Refusal of blood transfusions as patient is Jehovah's Witness   . Right bundle branch block    history of  . Seizures ([facility])    AS A CHILD. only esential tremors now.  . Sleep apnea    cpap - settings at 9 per patient   . Ulcer   JEHOVA's WITNESS  SURGICAL HISTORY: Past Surgical History:  Procedure Laterality Date  . ANTERIOR CERVICAL DECOMP/DISCECTOMY FUSION N/A 03/30/2013   Procedure: ANTERIOR CERVICAL DECOMPRESSION/DISCECTOMY FUSION 2 LEVELS;  Surgeon: Otilio Connors, MD;  Location: [facility];  Service: Neurosurgery;  Laterality: N/A;  C4-5 C5-6 Anterior cervical decompression/diskectomy/fusion/Allograft/Plate  . CHOLECYSTECTOMY N/A  12/07/2013   Procedure: LAPAROSCOPIC CHOLECYSTECTOMY WITH INTRAOPERATIVE CHOLANGIOGRAM;  Surgeon: Adin Hector, MD;  Location: [facility];  Service: General;  Laterality: N/A;  . COLONOSCOPY    . DECOMPRESSIVE LUMBAR LAMINECTOMY LEVEL 2 N/A 02/15/2015   Procedure: COMPLETE DECOMPRESSIVE LUMBAR LAMINECTOMY L4-L5/ FORAMINOTOMY TO L4 NERVE ROOT AND L5 NERVE ROOT BILATERALLY;  Surgeon: [name], MD;  Location: [facility];  Service: Orthopedics;  Laterality: N/A;  . EYE SURGERY     right, growth excision  . LUMBAR LAMINECTOMY/DECOMPRESSION MICRODISCECTOMY Left 01/18/2016   Procedure:  DECOMPRESSION L4-L5 MICRODISCECTOMY L4-L5 ON LEFT FOR SPINAL STENOSIS;  Surgeon: [name], MD;  Location: [facility];  Service: Orthopedics;  Laterality: Left;  . PAROTIDECTOMY Left 01/24/2018   Procedure: INCISIONAL BIOPSY OF LEFT PAROTID    MASS;  Surgeon: Helayne Seminole, MD;  Location: [facility];  Service: ENT;  Laterality: Left;  . SPINE SURGERY    . TONSILLECTOMY    . VASECTOMY    . WRIST GANGLION EXCISION Left     SOCIAL  HISTORY: Social History   Socioeconomic History  . Marital status: Married    Spouse name: Not on file  . Number of children: 2  . Years of education: Not on file  . Highest education level: Not on file  Occupational History  . Occupation: bus Education administrator: RETIRED  Tobacco Use  . Smoking status: Former Smoker    Packs/day: 0.50    Years: 14.00    Pack years: 7.00    Types: Cigarettes    Quit date: 12/14/1972    Years since quitting: 47.7  . Smokeless tobacco: Never Used  Vaping Use  . Vaping Use: Never used  Substance and Sexual Activity  . Alcohol use: Yes    Comment: occasionally  . Drug use: No  . Sexual activity: Yes    Birth control/protection: None  Other Topics Concern  . Not on file  Social History Narrative  . Not on file   Social Determinants of Health   Financial Resource Strain:   . Difficulty of Paying Living Expenses: Not on file  Food  Insecurity:   . Worried About Charity fundraiser in the Last Year: Not on file  . Ran Out of Food in the Last Year: Not on file  Transportation Needs:   . Lack of Transportation (Medical): Not on file  . Lack of Transportation (Non-Medical): Not on file  Physical Activity:   . Days of Exercise per Week: Not on file  . Minutes of Exercise per Session: Not on file  Stress:   . Feeling of Stress : Not on file  Social Connections:   . Frequency of Communication with Friends and Family: Not on file  . Frequency of Social Gatherings with Friends and Family: Not on file  . Attends Religious Services: Not on file  . Active Member of Clubs or Organizations: Not on file  . Attends Archivist Meetings: Not on file  . Marital Status: Not on file  Intimate Partner Violence:   . Fear of Current or Ex-Partner: Not on file  . Emotionally Abused: Not on file  . Physically Abused: Not on file  . Sexually Abused: Not on file    FAMILY HISTORY: Family History  Problem Relation Age of Onset  . Colon cancer Maternal Grandfather   . Kidney disease Brother   . Hypertension Brother   . Diabetes Brother   . Stroke Maternal Grandmother   . Diabetes Mother   . Hypertension Mother   . CAD Father        died of MI at age 66  . Hypertension Father   . Diabetes Paternal Aunt        x 4 aunts    ALLERGIES:  is allergic to lisinopril and other.  MEDICATIONS:  Current Outpatient Medications  Medication Sig Dispense Refill  . aspirin EC 81 MG tablet Take 1 tablet (81 mg total) by mouth daily. Swallow whole. 90 tablet 3  . atorvastatin (LIPITOR) 20 MG tablet Take 0.5 tablets (10 mg total) by mouth every evening. 90 tablet 3  . gabapentin (NEURONTIN) 300 MG capsule Take 300 mg by mouth at bedtime.    . hydrochlorothiazide (HYDRODIURIL) 25 MG tablet Take 1 tablet (25 mg total) by mouth daily. 90 tablet 0  . HYDROcodone-acetaminophen (NORCO/VICODIN) 5-325 MG tablet Take 1 tablet by mouth every  4 (four) hours as needed for moderate pain. 30 tablet 0  . methocarbamol (ROBAXIN) 500 MG tablet Take 1 tablet (500 mg  total) by mouth every 6 (six) hours as needed for muscle spasms. 28 tablet 0  . metoprolol succinate (TOPROL-XL) 100 MG 24 hr tablet TAKE 1 TABLET BY MOUTH ONCE DAILY WITH  OR  IMMEDIATELY  FOLLOWING  A  MEAL 90 tablet 2  . nitroGLYCERIN (NITROSTAT) 0.4 MG SL tablet Place 1 tablet (0.4 mg total) under the tongue every 5 (five) minutes as needed for chest pain. 90 tablet 3  . oxycodone (OXY-IR) 5 MG capsule Take 5 mg by mouth every 6 (six) hours as needed.    . Tamsulosin HCl (FLOMAX) 0.4 MG CAPS Take 0.5 mg by mouth at bedtime.      No current facility-administered medications for this visit.    PHYSICAL EXAMINATION: ECOG FS:1 - Symptomatic but completely ambulatory  Vitals:   09/02/20 1128  BP: (!) 150/72  Pulse: 69  Resp: 18  Temp: 97.7 F (36.5 C)  SpO2: 97%   Wt Readings from Last 3 Encounters:  09/02/20 226 lb 14.4 oz (102.9 kg)  08/05/20 229 lb (103.9 kg)  04/27/20 223 lb (101.2 kg)   Body mass index is 36.62 kg/m.    GENERAL:alert, in no acute distress and comfortable SKIN: no acute rashes, no significant lesions EYES: conjunctiva are pink and non-injected, sclera anicteric OROPHARYNX: MMM, no exudates, no oropharyngeal erythema or ulceration NECK: supple, no JVD LYMPH:  no palpable lymphadenopathy in the cervical, axillary or inguinal regions LUNGS: clear to auscultation b/l with normal respiratory effort HEART: regular rate & rhythm ABDOMEN:  normoactive bowel sounds , non tender, not distended. No palpable hepatosplenomegaly.  Extremity: no pedal edema PSYCH: alert & oriented x 3 with fluent speech NEURO: no focal motor/sensory deficits  LABS CBC Latest Ref Rng & Units 09/02/2020 03/04/2020 02/22/2020  WBC 4.0 - 10.5 K/uL 5.9 4.5 4.4  Hemoglobin 13.0 - 17.0 g/dL 11.6(L) 10.8(L) 11.6(L)  Hematocrit 39 - 52 % 36.4(L) 34.6(L) 37.6(L)  Platelets  150 - 400 K/uL 212 257 239   CBC    Component Value Date/Time   WBC 5.9 09/02/2020 1041   RBC 3.90 (L) 09/02/2020 1041   HGB 11.6 (L) 09/02/2020 1041   HGB 13.7 03/18/2018 0848   HCT 36.4 (L) 09/02/2020 1041   HCT 42.7 12/19/2017 1416   PLT 212 09/02/2020 1041   PLT 195 03/18/2018 0848   MCV 93.3 09/02/2020 1041   MCV 94.0 05/09/2018 1805   MCH 29.7 09/02/2020 1041   MCHC 31.9 09/02/2020 1041   RDW 14.9 09/02/2020 1041   LYMPHSABS 1.0 09/02/2020 1041   MONOABS 0.6 09/02/2020 1041   EOSABS 0.1 09/02/2020 1041   BASOSABS 0.0 09/02/2020 1041    CMP Latest Ref Rng & Units 09/02/2020 03/04/2020 02/22/2020  Glucose 70 - 99 mg/dL 83 80 92  BUN 8 - 23 mg/dL 22 18 14   Creatinine 0.61 - 1.24 mg/dL 1.25(H) 1.25(H) 1.26(H)  Sodium 135 - 145 mmol/L 139 143 140  Potassium 3.5 - 5.1 mmol/L 3.8 3.7 3.4(L)  Chloride 98 - 111 mmol/L 104 105 102  CO2 22 - 32 mmol/L 28 28 25   Calcium 8.9 - 10.3 mg/dL 8.9 8.5(L) 8.8(L)  Total Protein 6.5 - 8.1 g/dL 7.3 7.2 -  Total Bilirubin 0.3 - 1.2 mg/dL 0.4 0.3 -  Alkaline Phos 38 - 126 U/L 160(H) 166(H) -  AST 15 - 41 U/L 18 16 -  ALT 0 - 44 U/L 9 10 -    Lab Results  Component Value Date   LDH 206 (H) 03/04/2020  LABORATORY DATA:       01/27/18 Flow Cytometry:   01/27/18 Parotid Gland Surgical Pathology   RADIOGRAPHIC STUDIES: I have personally reviewed the radiological images as listed and agreed with the findings in the report. CT LUMBAR SPINE WO CONTRAST  Result Date: 08/19/2020 CLINICAL DATA:  Chronic low back pain radiating into the right leg. History of revision decompressive laminectomy and facetectomies at L4-5 and L4-S1 fusion 12/25/2019. EXAM: CT LUMBAR SPINE WITHOUT CONTRAST TECHNIQUE: Multidetector CT imaging of the lumbar spine was performed without intravenous contrast administration. Multiplanar CT image reconstructions were also generated. COMPARISON:  Intraoperative imaging 12/25/2019. Postmyelogram CT lumbar spine  10/23/2019. FINDINGS: Segmentation: Standard. Alignment: Convex right scoliosis with the apex at L3 is again seen. Trace retrolisthesis L2 on L3 and L3 on L4. There is also 0.5 cm anterolisthesis L4 on L5. Alignment is unchanged. Vertebrae: No fracture or worrisome lesion. The patient has undergone L4-S1 fusion with pedicle screws, stabilization bars and an interbody spacer at L4-5. There is extensive lucency about the patient's S1 screws. Pedicle screws are appropriately positioned and hardware is intact. Paraspinal and other soft tissues: Negative. Disc levels: T11-12: Moderate facet degenerative disease. Otherwise negative. T12-L1: Mild facet degenerative change.  Otherwise negative. L1-2: Mild facet degenerative change.  Otherwise negative. L2-3: Loss of disc space height and vacuum disc phenomenon. Shallow bulge and endplate spur. Mild central canal and mild to moderate foraminal narrowing, more notable on the left. No change. L3-4: Facet degenerative disease, loss of disc space height and vacuum disc phenomenon. Disc bulge and endplate spur are more prominent to the left. There is some ligamentum flavum thickening. Mild to moderate central canal stenosis and left worse than right subarticular recess narrowing. The right foramen is open. Moderately severe left foraminal narrowing. No change. L4-5: The patient has undergone discectomy and facetectomy since the prior study. Laminectomy has also been revised. The central canal and left foramen appear widely patent. There is mild right foraminal narrowing but the appearance is improved. L5-S1: Status post laminectomy since the prior study. Loss of disc space height and vacuum disc phenomenon again seen. There is a disc bulge and endplate spur, more prominent to the right. The central canal is open. Mild to moderate foraminal narrowing is worse on the right. The appearance is improved. IMPRESSION: Postoperative change L4-S1 as described above. Hardware is intact but  there is extensive lucency about the screws in S1 consistent with loosening. The central canal is widely patent at both levels. Mild to moderate foraminal narrowing at L5-S1 is more notable on the right but also improved in appearance. No change in mild to moderate central canal and left greater than right subarticular recess narrowing at L3-4. Moderately severe left foraminal narrowing at L3-4 is also unchanged in appearance. Electronically Signed   By: Inge Rise M.D.   On: 08/19/2020 09:15    ASSESSMENT & PLAN:  Bryan [name] is a 74 y.o. male with    1.Recently diagnosed Stage IIE Marginal-zone lymphoma involving the left parotid gland, left cervical LN and mediastinal lymph nodes. . Lab Results  Component Value Date   LDH 206 (H) 03/04/2020   Hep C neg and Hep B -He completed 4 dose Rituxan 02/07/18-02/28/18.  LDH has normalized and the Parotid mass is no longer palpable.  04/22/18 PET/CT revealed complete metabolic response to therapy. No residual hypermetabolic mass or lymphadenopathy.   2. Jehovas Witness- declines any use of blood products.  3. Rheumatoid Arthritis  Rituxan seems to have  helped his RA as well. - off  MTX and prednisone and is currently on plaquenil per his rheumatologist, Dr. Amil Amen and PA Leafy Kindle.  -I encouraged him to be more active with at least walking and possibly start PT or Silver Sneakers program.  4. Low B12 levels -continue B12 SL 1030mcg SL daily  5. Wheezing - resolved currently PFTs 02/26/2018 - Pulmonary Function Diagnosis: Minimal Obstructive Airways Disease Minimal Restriction - Moderate Diffusion Defect   PLAN: -Discussed pt labwork today, 09/02/20; blood counts and chemistries are stable, B12 & Ferritin are borderline low. -No lab or clinical evidence of lymphoma progression at this time. Will continue watchful observation.  -Advised pt that he may have synovial fluid caused by inflammation from RA in his right knee. F/u with  Rheumatologist for evaluation. -Continue f/u with Rheumatology for continued Rituxan for RA which will likely also benefit lymphoma control. -Will begin pt on PO 150 mg Iron Polysaccharide and B12 1000 mcg po daily. -Will see back in 12 months with labs    FOLLOW UP: RTC with Dr Irene Limbo with labs in 12 months   The total time spent in the appt was 20 minutes and more than 50% was on counseling and direct patient cares.  All of the patient's questions were answered with apparent satisfaction. The patient knows to call the clinic with any problems, questions or concerns.   Sullivan Lone MD [place] AAHIVMS [facility] Hematology/Oncology Physician [facility]  (Office):       [phone] (Work cell):  [phone] (Fax):           [phone]  I, Yevette Edwards, am acting as a scribe for Dr. Sullivan Lone.   .I have reviewed the above documentation for accuracy and completeness, and I agree with the above. [name] MD

## 2020-11-22 DIAGNOSIS — U071 COVID-19: Secondary | ICD-10-CM | POA: Diagnosis not present

## 2020-12-07 DIAGNOSIS — I509 Heart failure, unspecified: Secondary | ICD-10-CM | POA: Diagnosis not present

## 2020-12-07 DIAGNOSIS — M069 Rheumatoid arthritis, unspecified: Secondary | ICD-10-CM | POA: Diagnosis not present

## 2020-12-07 DIAGNOSIS — I272 Pulmonary hypertension, unspecified: Secondary | ICD-10-CM | POA: Diagnosis not present

## 2020-12-07 DIAGNOSIS — I25118 Atherosclerotic heart disease of native coronary artery with other forms of angina pectoris: Secondary | ICD-10-CM | POA: Diagnosis not present

## 2020-12-07 DIAGNOSIS — C8580 Other specified types of non-Hodgkin lymphoma, unspecified site: Secondary | ICD-10-CM | POA: Diagnosis not present

## 2020-12-07 DIAGNOSIS — I119 Hypertensive heart disease without heart failure: Secondary | ICD-10-CM | POA: Diagnosis not present

## 2020-12-07 DIAGNOSIS — J410 Simple chronic bronchitis: Secondary | ICD-10-CM | POA: Diagnosis not present

## 2020-12-07 DIAGNOSIS — E119 Type 2 diabetes mellitus without complications: Secondary | ICD-10-CM | POA: Diagnosis not present

## 2020-12-12 DIAGNOSIS — C8580 Other specified types of non-Hodgkin lymphoma, unspecified site: Secondary | ICD-10-CM | POA: Diagnosis not present

## 2020-12-12 DIAGNOSIS — M069 Rheumatoid arthritis, unspecified: Secondary | ICD-10-CM | POA: Diagnosis not present

## 2020-12-12 DIAGNOSIS — Z1159 Encounter for screening for other viral diseases: Secondary | ICD-10-CM | POA: Diagnosis not present

## 2020-12-12 DIAGNOSIS — Z79899 Other long term (current) drug therapy: Secondary | ICD-10-CM | POA: Diagnosis not present

## 2020-12-12 DIAGNOSIS — I509 Heart failure, unspecified: Secondary | ICD-10-CM | POA: Diagnosis not present

## 2021-02-06 ENCOUNTER — Ambulatory Visit: Payer: Medicare Other | Admitting: Cardiology

## 2021-02-06 NOTE — Progress Notes (Signed)
No show

## 2021-05-29 ENCOUNTER — Encounter: Payer: Self-pay | Admitting: *Deleted

## 2021-05-30 ENCOUNTER — Ambulatory Visit: Payer: Medicare HMO | Admitting: Neurology

## 2021-05-30 ENCOUNTER — Encounter: Payer: Self-pay | Admitting: Neurology

## 2021-08-31 ENCOUNTER — Other Ambulatory Visit: Payer: Self-pay

## 2021-08-31 DIAGNOSIS — C8580 Other specified types of non-Hodgkin lymphoma, unspecified site: Secondary | ICD-10-CM

## 2021-09-01 ENCOUNTER — Inpatient Hospital Stay: Payer: Medicare HMO | Admitting: Hematology

## 2021-09-01 ENCOUNTER — Telehealth: Payer: Self-pay | Admitting: Hematology

## 2021-09-01 ENCOUNTER — Inpatient Hospital Stay: Payer: Medicare HMO

## 2021-09-01 NOTE — Telephone Encounter (Signed)
Rescheduled per 10/21in basket, pt confirmed new appt

## 2021-09-06 ENCOUNTER — Other Ambulatory Visit: Payer: Self-pay | Admitting: Neurosurgery

## 2021-09-06 DIAGNOSIS — S32009K Unspecified fracture of unspecified lumbar vertebra, subsequent encounter for fracture with nonunion: Secondary | ICD-10-CM

## 2021-09-11 ENCOUNTER — Other Ambulatory Visit: Payer: Self-pay | Admitting: Neurosurgery

## 2021-09-13 ENCOUNTER — Other Ambulatory Visit: Payer: Self-pay | Admitting: Neurosurgery

## 2021-09-26 ENCOUNTER — Inpatient Hospital Stay: Payer: No Typology Code available for payment source | Attending: Hematology

## 2021-09-26 ENCOUNTER — Other Ambulatory Visit: Payer: Self-pay

## 2021-09-26 ENCOUNTER — Inpatient Hospital Stay (HOSPITAL_BASED_OUTPATIENT_CLINIC_OR_DEPARTMENT_OTHER): Payer: No Typology Code available for payment source | Admitting: Hematology

## 2021-09-26 VITALS — BP 161/76 | HR 75 | Temp 97.9°F | Resp 18 | Wt 226.7 lb

## 2021-09-26 DIAGNOSIS — N189 Chronic kidney disease, unspecified: Secondary | ICD-10-CM | POA: Insufficient documentation

## 2021-09-26 DIAGNOSIS — E538 Deficiency of other specified B group vitamins: Secondary | ICD-10-CM | POA: Diagnosis not present

## 2021-09-26 DIAGNOSIS — M069 Rheumatoid arthritis, unspecified: Secondary | ICD-10-CM | POA: Insufficient documentation

## 2021-09-26 DIAGNOSIS — Z9221 Personal history of antineoplastic chemotherapy: Secondary | ICD-10-CM | POA: Diagnosis not present

## 2021-09-26 DIAGNOSIS — Z8572 Personal history of non-Hodgkin lymphomas: Secondary | ICD-10-CM | POA: Diagnosis present

## 2021-09-26 DIAGNOSIS — C8580 Other specified types of non-Hodgkin lymphoma, unspecified site: Secondary | ICD-10-CM

## 2021-09-26 DIAGNOSIS — Z87891 Personal history of nicotine dependence: Secondary | ICD-10-CM | POA: Diagnosis not present

## 2021-09-26 DIAGNOSIS — Z79899 Other long term (current) drug therapy: Secondary | ICD-10-CM | POA: Insufficient documentation

## 2021-09-26 LAB — CMP (CANCER CENTER ONLY)
ALT: 16 U/L (ref 0–44)
AST: 19 U/L (ref 15–41)
Albumin: 3.5 g/dL (ref 3.5–5.0)
Alkaline Phosphatase: 184 U/L — ABNORMAL HIGH (ref 38–126)
Anion gap: 10 (ref 5–15)
BUN: 22 mg/dL (ref 8–23)
CO2: 27 mmol/L (ref 22–32)
Calcium: 8.5 mg/dL — ABNORMAL LOW (ref 8.9–10.3)
Chloride: 103 mmol/L (ref 98–111)
Creatinine: 1.38 mg/dL — ABNORMAL HIGH (ref 0.61–1.24)
GFR, Estimated: 53 mL/min — ABNORMAL LOW (ref >60)
Glucose, Bld: 97 mg/dL (ref 70–99)
Potassium: 3.7 mmol/L (ref 3.5–5.1)
Sodium: 140 mmol/L (ref 135–145)
Total Bilirubin: <0.2 mg/dL — ABNORMAL LOW (ref 0.3–1.2)
Total Protein: 7.5 g/dL (ref 6.5–8.1)

## 2021-09-26 LAB — FERRITIN: Ferritin: 38 ng/mL (ref 24–336)

## 2021-09-26 LAB — CBC WITH DIFFERENTIAL (CANCER CENTER ONLY)
Abs Immature Granulocytes: 0.02 10*3/uL (ref 0.00–0.07)
Basophils Absolute: 0 10*3/uL (ref 0.0–0.1)
Basophils Relative: 1 %
Eosinophils Absolute: 0.1 10*3/uL (ref 0.0–0.5)
Eosinophils Relative: 3 %
HCT: 36.7 % — ABNORMAL LOW (ref 39.0–52.0)
Hemoglobin: 12.2 g/dL — ABNORMAL LOW (ref 13.0–17.0)
Immature Granulocytes: 1 %
Lymphocytes Relative: 18 %
Lymphs Abs: 0.8 10*3/uL (ref 0.7–4.0)
MCH: 31.9 pg (ref 26.0–34.0)
MCHC: 33.2 g/dL (ref 30.0–36.0)
MCV: 96.1 fL (ref 80.0–100.0)
Monocytes Absolute: 0.5 10*3/uL (ref 0.1–1.0)
Monocytes Relative: 11 %
Neutro Abs: 3 10*3/uL (ref 1.7–7.7)
Neutrophils Relative %: 66 %
Platelet Count: 234 10*3/uL (ref 150–400)
RBC: 3.82 MIL/uL — ABNORMAL LOW (ref 4.22–5.81)
RDW: 14 % (ref 11.5–15.5)
WBC Count: 4.4 10*3/uL (ref 4.0–10.5)
nRBC: 0 % (ref 0.0–0.2)

## 2021-09-26 LAB — VITAMIN B12: Vitamin B-12: 209 pg/mL (ref 180–914)

## 2021-09-26 NOTE — Progress Notes (Signed)
Surgical Instructions    Your procedure is scheduled on Monday, November 21st, 2022.   Report to [facility] Main Entrance "A" at 10:40 A.M., then check in with the Admitting office.  Call this number if you have problems the morning of surgery:  [phone]   If you have any questions prior to your surgery date call [phone]: Open Monday-Friday 8am-4pm    Remember:  Do not eat or drink after midnight the night before your surgery     Take these medicines the morning of surgery with A SIP OF WATER:  gabapentin (NEURONTIN) methocarbamol (ROBAXIN)  traMADol-acetaminophen (ULTRACET)  primidone (MYSOLINE)  nitroGLYCERIN (NITROSTAT) - if needed  As of today, STOP taking any Aspirin (unless otherwise instructed by your surgeon) Aleve, Naproxen, Ibuprofen, Motrin, Advil, Goody's, BC's, all herbal medications, fish oil, and all vitamins.   After your COVID test   You are not required to quarantine however you are required to wear a well-fitting mask when you are out and around people not in your household.  If your mask becomes wet or soiled, replace with a new one.  Wash your hands often with soap and water for 20 seconds or clean your hands with an alcohol-based hand sanitizer that contains at least 60% alcohol.  Do not share personal items.  Notify your provider: if you are in close contact with someone who has COVID  or if you develop a fever of 100.4 or greater, sneezing, cough, sore throat, shortness of breath or body aches.    The day of surgery:          Do not wear jewelry  Do not wear lotions, powders, colognes, or deodorant. Men may shave face and neck. Do not bring valuables to the hospital.              [facility] is not responsible for any belongings or valuables.  Do NOT Smoke (Tobacco/Vaping)  24 hours prior to your procedure  If you use a CPAP at night, you may bring your mask for your overnight stay.   Contacts, glasses, hearing aids, dentures or  partials may not be worn into surgery, please bring cases for these belongings   For patients admitted to the hospital, discharge time will be determined by your treatment team.   Patients discharged the day of surgery will not be allowed to drive home, and someone needs to stay with them for 24 hours.  NO VISITORS WILL BE ALLOWED IN PRE-OP WHERE PATIENTS ARE PREPPED FOR SURGERY.  ONLY 1 SUPPORT PERSON MAY BE PRESENT IN THE WAITING ROOM WHILE YOU ARE IN SURGERY.  IF YOU ARE TO BE ADMITTED, ONCE YOU ARE IN YOUR ROOM YOU WILL BE ALLOWED TWO (2) VISITORS. 1 (ONE) VISITOR MAY STAY OVERNIGHT BUT MUST ARRIVE TO THE ROOM BY 8pm.  Minor children may have two parents present. Special consideration for safety and communication needs will be reviewed on a case by case basis.  Special instructions:    Oral Hygiene is also important to reduce your risk of infection.  Remember - BRUSH YOUR TEETH THE MORNING OF SURGERY WITH YOUR REGULAR TOOTHPASTE   [place]- Preparing For Surgery  Before surgery, you can play an important role. Because skin is not sterile, your skin needs to be as free of germs as possible. You can reduce the number of germs on your skin by washing with CHG (chlorahexidine gluconate) Soap before surgery.  CHG is an antiseptic cleaner which kills germs and bonds with  the skin to continue killing germs even after washing.     Please do not use if you have an allergy to CHG or antibacterial soaps. If your skin becomes reddened/irritated stop using the CHG.  Do not shave (including legs and underarms) for at least 48 hours prior to first CHG shower. It is OK to shave your face.  Please follow these instructions carefully.     Shower the NIGHT BEFORE SURGERY and the MORNING OF SURGERY with CHG Soap.   If you chose to wash your hair, wash your hair first as usual with your normal shampoo. After you shampoo, rinse your hair and body thoroughly to remove the shampoo.  Then [employer] and  genitals (private parts) with your normal soap and rinse thoroughly to remove soap.  After that Use CHG Soap as you would any other liquid soap. You can apply CHG directly to the skin and wash gently with a scrungie or a clean washcloth.   Apply the CHG Soap to your body ONLY FROM THE NECK DOWN.  Do not use on open wounds or open sores. Avoid contact with your eyes, ears, mouth and genitals (private parts). Wash Face and genitals (private parts)  with your normal soap.   Wash thoroughly, paying special attention to the area where your surgery will be performed.  Thoroughly rinse your body with warm water from the neck down.  DO NOT shower/wash with your normal soap after using and rinsing off the CHG Soap.  Pat yourself dry with a CLEAN TOWEL.  Wear CLEAN PAJAMAS to bed the night before surgery  Place CLEAN SHEETS on your bed the night before your surgery  DO NOT SLEEP WITH PETS.   Day of Surgery:  Take a shower with CHG soap. Wear Clean/Comfortable clothing the morning of surgery Do not apply any deodorants/lotions.   Remember to brush your teeth WITH YOUR REGULAR TOOTHPASTE.   Please read over the following fact sheets that you were given.

## 2021-09-27 ENCOUNTER — Other Ambulatory Visit: Payer: Self-pay

## 2021-09-27 ENCOUNTER — Encounter (HOSPITAL_COMMUNITY)
Admission: RE | Admit: 2021-09-27 | Discharge: 2021-09-27 | Disposition: A | Discharge status: Home / Self Care | Payer: No Typology Code available for payment source | Source: Ambulatory Visit | Attending: Neurosurgery | Admitting: Neurosurgery

## 2021-09-27 ENCOUNTER — Encounter (HOSPITAL_COMMUNITY): Payer: Self-pay

## 2021-09-27 DIAGNOSIS — N400 Enlarged prostate without lower urinary tract symptoms: Secondary | ICD-10-CM | POA: Diagnosis not present

## 2021-09-27 DIAGNOSIS — Z9989 Dependence on other enabling machines and devices: Secondary | ICD-10-CM | POA: Diagnosis not present

## 2021-09-27 DIAGNOSIS — E669 Obesity, unspecified: Secondary | ICD-10-CM | POA: Diagnosis not present

## 2021-09-27 DIAGNOSIS — M48062 Spinal stenosis, lumbar region with neurogenic claudication: Secondary | ICD-10-CM | POA: Diagnosis not present

## 2021-09-27 DIAGNOSIS — I10 Essential (primary) hypertension: Secondary | ICD-10-CM | POA: Diagnosis not present

## 2021-09-27 DIAGNOSIS — Z79899 Other long term (current) drug therapy: Secondary | ICD-10-CM | POA: Diagnosis not present

## 2021-09-27 DIAGNOSIS — M069 Rheumatoid arthritis, unspecified: Secondary | ICD-10-CM | POA: Diagnosis not present

## 2021-09-27 DIAGNOSIS — Z87891 Personal history of nicotine dependence: Secondary | ICD-10-CM | POA: Diagnosis not present

## 2021-09-27 DIAGNOSIS — E119 Type 2 diabetes mellitus without complications: Secondary | ICD-10-CM | POA: Insufficient documentation

## 2021-09-27 DIAGNOSIS — G250 Essential tremor: Secondary | ICD-10-CM | POA: Diagnosis not present

## 2021-09-27 DIAGNOSIS — Z6837 Body mass index (BMI) 37.0-37.9, adult: Secondary | ICD-10-CM | POA: Insufficient documentation

## 2021-09-27 DIAGNOSIS — D649 Anemia, unspecified: Secondary | ICD-10-CM | POA: Insufficient documentation

## 2021-09-27 DIAGNOSIS — I2510 Atherosclerotic heart disease of native coronary artery without angina pectoris: Secondary | ICD-10-CM | POA: Insufficient documentation

## 2021-09-27 DIAGNOSIS — K219 Gastro-esophageal reflux disease without esophagitis: Secondary | ICD-10-CM | POA: Diagnosis not present

## 2021-09-27 DIAGNOSIS — I452 Bifascicular block: Secondary | ICD-10-CM | POA: Insufficient documentation

## 2021-09-27 DIAGNOSIS — Z01818 Encounter for other preprocedural examination: Secondary | ICD-10-CM | POA: Diagnosis present

## 2021-09-27 DIAGNOSIS — G4733 Obstructive sleep apnea (adult) (pediatric): Secondary | ICD-10-CM | POA: Insufficient documentation

## 2021-09-27 LAB — CBC
HCT: 37.5 % — ABNORMAL LOW (ref 39.0–52.0)
Hemoglobin: 12.2 g/dL — ABNORMAL LOW (ref 13.0–17.0)
MCH: 31.9 pg (ref 26.0–34.0)
MCHC: 32.5 g/dL (ref 30.0–36.0)
MCV: 97.9 fL (ref 80.0–100.0)
Platelets: 242 10*3/uL (ref 150–400)
RBC: 3.83 MIL/uL — ABNORMAL LOW (ref 4.22–5.81)
RDW: 13.8 % (ref 11.5–15.5)
WBC: 4.5 10*3/uL (ref 4.0–10.5)
nRBC: 0 % (ref 0.0–0.2)

## 2021-09-27 LAB — NO BLOOD PRODUCTS

## 2021-09-27 LAB — COMPREHENSIVE METABOLIC PANEL
ALT: 19 U/L (ref 0–44)
AST: 25 U/L (ref 15–41)
Albumin: 3.3 g/dL — ABNORMAL LOW (ref 3.5–5.0)
Alkaline Phosphatase: 164 U/L — ABNORMAL HIGH (ref 38–126)
Anion gap: 8 (ref 5–15)
BUN: 21 mg/dL (ref 8–23)
CO2: 30 mmol/L (ref 22–32)
Calcium: 8.3 mg/dL — ABNORMAL LOW (ref 8.9–10.3)
Chloride: 99 mmol/L (ref 98–111)
Creatinine, Ser: 1.35 mg/dL — ABNORMAL HIGH (ref 0.61–1.24)
GFR, Estimated: 54 mL/min — ABNORMAL LOW (ref >60)
Glucose, Bld: 76 mg/dL (ref 70–99)
Potassium: 4.4 mmol/L (ref 3.5–5.1)
Sodium: 137 mmol/L (ref 135–145)
Total Bilirubin: 0.9 mg/dL (ref 0.3–1.2)
Total Protein: 6.7 g/dL (ref 6.5–8.1)

## 2021-09-27 LAB — SURGICAL PCR SCREEN
MRSA, PCR: NEGATIVE
Staphylococcus aureus: NEGATIVE

## 2021-09-27 NOTE — Progress Notes (Signed)
PCP: Dustin Folks, FNP Cardiologist: Kerney Elbe, MD  EKG: 09/27/21 CXR: na ECHO: 07/30/18 Stress Test: 04/27/20 Cardiac Cath: denies  Fasting Blood Sugar- na Checks Blood Sugar_na__ times a day  OSA/CPAP: Yes, wears nightly  ASA/Blood Thinner: No  Covid test scheduled for 09/28/21 at 1345  Anesthesia Review: Yes, cardiac history. Abnormal EKG  Refusal Blood Products form signed and faxed  Patient denies shortness of breath, fever, cough, and chest pain at PAT appointment.  Patient verbalized understanding of instructions provided today at the PAT appointment.  Patient asked to review instructions at home and day of surgery.

## 2021-09-28 ENCOUNTER — Other Ambulatory Visit (HOSPITAL_COMMUNITY)
Admission: RE | Admit: 2021-09-28 | Discharge: 2021-09-28 | Disposition: A | Discharge status: Home / Self Care | Payer: No Typology Code available for payment source | Source: Ambulatory Visit | Attending: Neurosurgery | Admitting: Neurosurgery

## 2021-09-28 DIAGNOSIS — Z01818 Encounter for other preprocedural examination: Secondary | ICD-10-CM

## 2021-09-28 DIAGNOSIS — Z20822 Contact with and (suspected) exposure to covid-19: Secondary | ICD-10-CM | POA: Diagnosis not present

## 2021-09-28 DIAGNOSIS — Z01812 Encounter for preprocedural laboratory examination: Secondary | ICD-10-CM | POA: Insufficient documentation

## 2021-09-28 NOTE — Progress Notes (Addendum)
Anesthesia Chart Review:  Case: [id number] Date/Time: 10/02/21 1226   Procedure: PLIF L4-5 (Back)   Anesthesia type: General   Pre-op diagnosis: LUMBAR STENOSIS WITH NEUROGENIC CLAUDICATION   Location: [facility] ROOM 21 / [facility]   Surgeons: Kary Kos, MD       DISCUSSION: Patient is a 75 year old Bryan [name] scheduled for the above procedure.   History includes former smoker (quit 1974), HTN, RBBB, DM2, OSA (uses CPAP, auto 7-12 per 05/31/21 [facility] notes), seizures (childhood), essential tremor, anemia, marginal zone lymphoma (involving left parotid gland, left cervical/mediastinal LN, s/p left parotid mass excision 01/24/18, s/p Rituxin), RA, GERD, BPH. Has hearing aids. Multiple spinal surgeries (C4-6 ACDF; L4-5 laminectomy 02/15/15; L3-4/4-5 decompression 01/18/16; redo L4-5 laminectomy with L4-5 PLIF 12/25/19). BMI is consistent with obesity.    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 His preoperative CBC showed H/H 12.2/37.5.     Last cardiology visit 08/05/20 with Dr. Virgina Jock. Mild prox CAD CCTA 2019. He had ED visit 02/22/20 for chest pain. Minimally elevated, flat HS troponin of 25-->23 (normal < 18). D-dimer elevated at 6.84, but CTA chest was negative for PE. EKG showed SR, PACs, known RBBB. He was started on metoprolol for HTN, and a 04/2020 stress showed mild anteroseptal ischemia stress test. Symptoms were controlled on metoprolol, and given mild anemia and refusal of blood products, medical therapy was recommended ("would like to avoid dual antiplatelet therapy and intervention, if possible"). Two week event monitor also ordered for irregular HR and showed predominant SR, 5 beat NSVT, < 30 second runs of atrial tachycardia but no afib. At 08/05/20 visit, continued medical therapy recommended for CAD. Six month follow-up recommended, but he did not show for 02/06/21 follow-up.   Reviewed case with anesthesiologist Myrtie Soman, MD. Given cardiac history and overdue follow-up would  recommend preoperative cardiology input. I have notified Lorriane Shire at Dr. Windy Carina office.  Preoperative COVID-19 testing is scheduled for 09/28/2021.  ADDENDUM 09/29/21 2:11 PM: COVID-19 test negative.  Patient evaluated at [facility] this morning by Lawerance Cruel, PA-C for preoperative evaluation. She wrote: "Cardiac risk stratification: Patient's physical exam and EKG are unchanged compared to previous office visit. There is no clinical evidence of heart failure and EKG shows no evidence of acute ischemic changes.  Although is back pain limits patient's physical activity he reports good functional status.  Patient's previous stress test showed small sized, mild intensity, reversible perfusion defect in apical anteroseptal myocardium, however he has had not recurrence of chest pain or anginal equivalent in the last 1.5 years with medical therapy.  Discussed with patient risk vs. Benefit of repeat stress test. However, as his functional capacity is > 4 METS, he is asymptomatic from a cardiovascular standpoint, EKG is unchanged compared to 03/2020, and stress test was relatively recent shared decision was not to delay surgery for further cardiac testing. Patient is aware that compared to others of his age and gender without his cardiovascular history he is at higher surgical risk, but overall he is relatively low risk and may proceed with surgery."    VS: BP (P) 139/72   Pulse (P) 78   Resp (P) 18   Ht (P) 5' 5.5" (1.664 m)   Wt (P) 103.1 kg   SpO2 (P) 100%   BMI (P) 37.27 kg/m    PROVIDERS: [name], FNP is PCP. He receives some care through the [facility]. Vernell Leep, MD is cardiologist. Previously he saw Alyson Locket, MD in 07/2018.  Sullivan Lone, MD is HEM-ONC. Last evaluation 09/26/21. Note not yet viewable in [facility].   Lind Guest, MD is ENT Tana Coast, PA-C is who he sees for rheumatology [facility] [place], see Care  Everywhere). Last visit seen 04/03/19.    LABS: Preoperative labs noted. Cr 1.35 and H/H 12.2/37.5, consistent with labs since 09/02/20  Calcium 8.3, corrected to 8.9 for albumin 3.3 (all labs ordered are listed, but only abnormal results are displayed)  Labs Reviewed  COMPREHENSIVE METABOLIC PANEL - Abnormal; Notable for the following components:      Result Value   Creatinine, Ser 1.35 (*)    Calcium 8.3 (*)    Albumin 3.3 (*)    Alkaline Phosphatase 164 (*)    GFR, Estimated 54 (*)    All other components within normal limits  CBC - Abnormal; Notable for the following components:   RBC 3.83 (*)    Hemoglobin 12.2 (*)    HCT 37.5 (*)    All other components within normal limits  SURGICAL PCR SCREEN  NO BLOOD PRODUCTS    PFTs 02/26/18: FVC 2.44 (79%), post 2.47 (80%). FEV1 1.88 (83%), post 1.95 (87%). DLCO unc 15.33 (59%), DLCO cor 16.22 (63%).   IMAGES: CT L-spine 08/18/20: IMPRESSION: - Postoperative change L4-S1 as described above. Hardware is intact but there is extensive lucency about the screws in S1 consistent with loosening. The central canal is widely patent at both levels. Mild to moderate foraminal narrowing at L5-S1 is more notable on the right but also improved in appearance. - No change in mild to moderate central canal and left greater than right subarticular recess narrowing at L3-4. Moderately severe left foraminal narrowing at L3-4 is also unchanged in appearance.    EKG: 09/27/21: Sinus rhythm with Premature atrial complexes Right bundle branch block Abnormal ECG No significant change since last tracing Confirmed by Daneen Schick [phone]) on 09/27/2021 5:55:59 PM   CV: Event monitor 04/28/2020 - 05/11/2020: Diagnostic time: 74%  Dominant rhythm: Sinus. HR 52-109 bpm. Avg HR 68 bpm. 5 beat NSVT at 2:44 am CT on 04/28/2020.  Recurring episodes of atrial tachycardia <30 sec on 05/06/2020 10:14 AM CT No atrial fibrillation/atrial flutter/SVT/VT/high grade  AV block, sinus pause >3sec noted. Symptoms reported: None   Lexiscan Tetrofosmin stress test 04/13/2020: Lexiscan/modified Bruce nuclear stress test performed using 1-day protocol. Stress EKG is non-diagnostic, as this is pharmacological stress test. In addition, stress EKG at 67% MPHR showed sinus tachycardia, RBBB, LAFB.  SPECT images show small sized, mild intensity, reversible perfusion defect in apical anteroseptal myocardium. Stress LVEF Bryan%. Intermediate risk study.    Echo 07/30/2018: Study Conclusions  - Left ventricle: The cavity size was normal. Wall thickness was    normal. Systolic function was normal. The estimated ejection    fraction was in the range of 55% to 60%. Wall motion was normal;    there were no regional wall motion abnormalities. Doppler    parameters are consistent with abnormal left ventricular    relaxation (grade 1 diastolic dysfunction).  - Aortic valve: There was mild regurgitation.  - Pulmonary arteries: Systolic pressure was mildly increased. PA    peak pressure: 38 mm Hg (S).     CT Coronary 07/30/2018: - Aorta:  Normal size.  No calcifications.  No dissection. - Aortic Valve:  Trileaflet.  No calcifications. - Coronary Arteries:  Normal coronary origin.  Right dominance. - RCA is a large dominant artery that gives rise to PDA and PLVB.  There is minimal on-calcified plaque with stenosis 0-25%. - Left main is a large artery that gives rise to LAD and LCX arteries. Left main has minimal stenosis. - LAD is a large vessel that has mild mixed plaque in the proximal and mid portio with stenosis 25-50% and a focal 50-69% stenosis in the mid LAD. - LCX is a non-dominant artery that gives rise to one large OM1 branch. There is minimal plaque with associated stenosis 0-25%. - Other findings: Normal pulmonary vein drainage into the left atrium. Normal let atrial appendage without a thrombus. Normal size of the pulmonary artery. IMPRESSION: 1. Coronary  calcium score of 21. This was 33 percentile for age and sex matched control. 2. Normal coronary origin with right dominance. 3. There is mild to moderate CAD in the proximal and mid LAD. Aggressive risk factor modification is recommended.   Past Medical History:  Diagnosis Date   Anemia    Arthritis    hands, knees, cervical area. Back pain. Rheumatoid arthritis- weekly injections.   BPH (benign prostatic hypertrophy)    Cancer (HCC)    Diabetes (HCC)    Essential tremor    GERD (gastroesophageal reflux disease)    reports for indigestion he uses mustard    Hearing deficit    wears hearing aids bilateral   HTN (hypertension)    Obesity    OSA (obstructive sleep apnea)    Refusal of blood transfusions as patient is Jehovah's Witness    Rheumatoid arthritis ([place])    Right bundle branch block    history of   Seizures ([place])    AS A CHILD. only esential tremors now.   Sleep apnea    cpap - settings at 9 per patient    Ulcer     Past Surgical History:  Procedure Laterality Date   ANTERIOR CERVICAL DECOMP/DISCECTOMY FUSION N/A 03/30/2013   Procedure: ANTERIOR CERVICAL DECOMPRESSION/DISCECTOMY FUSION 2 LEVELS;  Surgeon: Otilio Connors, MD;  Location: [facility];  Service: Neurosurgery;  Laterality: N/A;  C4-5 C5-6 Anterior cervical decompression/diskectomy/fusion/Allograft/Plate   CHOLECYSTECTOMY N/A 12/07/2013   Procedure: LAPAROSCOPIC CHOLECYSTECTOMY WITH INTRAOPERATIVE CHOLANGIOGRAM;  Surgeon: Adin Hector, MD;  Location: [facility];  Service: General;  Laterality: N/A;   COLONOSCOPY     DECOMPRESSIVE LUMBAR LAMINECTOMY LEVEL 2 N/A 02/15/2015   Procedure: COMPLETE DECOMPRESSIVE LUMBAR LAMINECTOMY L4-L5/ FORAMINOTOMY TO L4 NERVE ROOT AND L5 NERVE ROOT BILATERALLY;  Surgeon: [name], MD;  Location: [facility];  Service: Orthopedics;  Laterality: N/A;   EYE SURGERY     right, growth excision   LUMBAR LAMINECTOMY/DECOMPRESSION MICRODISCECTOMY Left 01/18/2016   Procedure:   DECOMPRESSION L4-L5 MICRODISCECTOMY L4-L5 ON LEFT FOR SPINAL STENOSIS;  Surgeon: [name], MD;  Location: [facility];  Service: Orthopedics;  Laterality: Left;   PAROTIDECTOMY Left 01/24/2018   Procedure: INCISIONAL BIOPSY OF LEFT PAROTID    MASS;  Surgeon: Helayne Seminole, MD;  Location: [facility];  Service: ENT;  Laterality: Left;   SPINE SURGERY     TONSILLECTOMY     VASECTOMY     WRIST GANGLION EXCISION Left     MEDICATIONS:  atorvastatin (LIPITOR) 20 MG tablet   gabapentin (NEURONTIN) 600 MG tablet   hydrochlorothiazide (HYDRODIURIL) 25 MG tablet   methocarbamol (ROBAXIN) 750 MG tablet   metoprolol succinate (TOPROL-XL) 100 MG 24 hr tablet   nitroGLYCERIN (NITROSTAT) 0.4 MG SL tablet   primidone (MYSOLINE) 50 MG tablet   Tamsulosin HCl (FLOMAX) 0.4 MG CAPS   traMADol-acetaminophen (ULTRACET) 37.5-325  MG tablet   No current facility-administered medications for this encounter.    Myra Gianotti, PA-C Surgical Short Stay/Anesthesiology [facility] Phone [phone] [facility] Phone [phone] 09/28/2021 6:26 PM

## 2021-09-29 ENCOUNTER — Ambulatory Visit: Payer: Medicare HMO | Admitting: Student

## 2021-09-29 ENCOUNTER — Encounter: Payer: Self-pay | Admitting: Student

## 2021-09-29 ENCOUNTER — Other Ambulatory Visit: Payer: Self-pay | Admitting: Neurosurgery

## 2021-09-29 ENCOUNTER — Other Ambulatory Visit: Payer: Self-pay

## 2021-09-29 VITALS — BP 136/78 | HR 86 | Temp 98.5°F | Ht 66.0 in | Wt 226.0 lb

## 2021-09-29 DIAGNOSIS — I2510 Atherosclerotic heart disease of native coronary artery without angina pectoris: Secondary | ICD-10-CM

## 2021-09-29 DIAGNOSIS — I10 Essential (primary) hypertension: Secondary | ICD-10-CM

## 2021-09-29 DIAGNOSIS — Z01810 Encounter for preprocedural cardiovascular examination: Secondary | ICD-10-CM

## 2021-09-29 LAB — SARS CORONAVIRUS 2 (TAT 6-24 HRS): SARS Coronavirus 2: NEGATIVE

## 2021-09-29 MED ORDER — ROSUVASTATIN CALCIUM 20 MG PO TABS: 20.0000 mg | ORAL_TABLET | Freq: Every day | ORAL | 3 refills | Status: DC

## 2021-09-29 NOTE — Progress Notes (Signed)
Patient referred by [name], FNP for precordial pain  Subjective:   Bryan [name], male    DOB: 02-18-1946, 75 y.o.   MRN: [id number]   Chief Complaint  Patient presents with   Hypertension   Follow-up   Medical Clearance     HPI  75 y.o. 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   Patient was last seen 08/05/2020 by Dr. Virgina Jock.  He was advised at that time to follow-up in 6 months, but unfortunately has been lost to follow-up until then.  He now presents for preoperative risk stratification.  Patient is presently scheduled to undergo lumbar fusion with Dr. Saintclair Halsted on 10/02/2021.  Patient has been doing well from a cardiovascular standpoint over the last year.  He states his physical activity is somewhat limited due to back and leg pain, however he is able to walk approximately half a mile 3-4 times per week without chest pain, shortness of breath, dizziness, or significant fatigue.  He also is able to walk up about 1 flight of stairs without issue, with the exception of back and leg pain.  Patient denies chest pain, palpitations, dyspnea, syncope, and near syncope.  Denies orthopnea, PND, leg edema.  Notably patient stopped his atorvastatin approximately a month ago given concerns of memory loss.  However he is willing to rechallenge statin therapy with rosuvastatin.  Current Outpatient Medications on File Prior to Visit  Medication Sig Dispense Refill   gabapentin (NEURONTIN) 600 MG tablet Take 600 mg by mouth 2 (two) times daily.     hydrochlorothiazide (HYDRODIURIL) 25 MG tablet Take 1 tablet (25 mg total) by mouth daily. 90 tablet 0   methocarbamol (ROBAXIN) 750 MG tablet Take 750 mg by mouth in the morning and at bedtime.     metoprolol succinate (TOPROL-XL) 100 MG 24 hr tablet TAKE 1 TABLET  BY MOUTH ONCE DAILY WITH  OR  IMMEDIATELY  FOLLOWING  A  MEAL (Patient taking differently: TAKE 1 TABLET BY MOUTH ONCE DAILY WITH  OR  IMMEDIATELY  FOLLOWING  A  MEAL) 90 tablet 2   nitroGLYCERIN (NITROSTAT) 0.4 MG SL tablet Place 0.4 mg under the tongue every 5 (five) minutes as needed for chest pain.     primidone (MYSOLINE) 50 MG tablet Take 150 mg by mouth in the morning and at bedtime.     Tamsulosin HCl (FLOMAX) 0.4 MG CAPS Take 0.4 mg by mouth at bedtime.     traMADol-acetaminophen (ULTRACET) 37.5-325 MG tablet Take 1 tablet by mouth in the morning and at bedtime.     riTUXimab (RITUXAN) 100 MG/10ML injection as needed.     No current facility-administered medications on file prior to visit.    Cardiovascular and other pertinent studies: EKG 09/29/2021: Sinus rhythm with single PAC at a rate of 81 bpm.  Normal axis.  Right bundle branch block.  No evidence of ischemia or underlying injury pattern.  Compared to EKG 03/25/2020, no significant change.   Event monitor 04/28/2020 - 05/11/2020: Diagnostic time: 74%  Dominant rhythm: Sinus. HR 52-109 bpm. Avg HR 68 bpm. 5 beat NSVT at 2:44 am CT on 04/28/2020.  Recurring episodes of atrial tachycardia <30 sec on 05/06/2020 10:14 AM CT No atrial fibrillation/atrial flutter/SVT/VT/high grade AV block, sinus pause >3sec noted. Symptoms reported: None  Lexiscan Tetrofosmin stress test 04/13/2020: Lexiscan/modified Bruce nuclear stress  test performed using 1-day protocol. Stress EKG is non-diagnostic, as this is pharmacological stress test. In addition, stress EKG at 67% MPHR showed sinus tachycardia, RBBB, LAFB.  SPECT images show small sized, mild intensity, reversible perfusion defect in apical anteroseptal myocardium. Stress LVEF 42%. Intermediate risk study.  EKG 03/25/2020: Sinus rhythm 84 bpm Right bundle branch block.  Frequent PAC s   CTA chest 02/2020: Negative for PE  Coronary CT angiogram 07/2018: 1. Coronary calcium score of 21.  This was 73 percentile for age and sex matched control. 2. Normal coronary origin with right dominance. 3. There is mild to moderate CAD in the proximal and mid LAD. Aggressive risk factor modification is recommended.  Recent labs: CMP Latest Ref Rng & Units 09/27/2021 09/26/2021 09/02/2020  Glucose 70 - 99 mg/dL 76 97 83  BUN 8 - 23 mg/dL 21 22 22   Creatinine 0.61 - 1.24 mg/dL 1.35(H) 1.38(H) 1.25(H)  Sodium 135 - 145 mmol/L 137 140 139  Potassium 3.5 - 5.1 mmol/L 4.4 3.7 3.8  Chloride 98 - 111 mmol/L 99 103 104  CO2 22 - 32 mmol/L 30 27 28   Calcium 8.9 - 10.3 mg/dL 8.3(L) 8.5(L) 8.9  Total Protein 6.5 - 8.1 g/dL 6.7 7.5 7.3  Total Bilirubin 0.3 - 1.2 mg/dL 0.9 <0.2(L) 0.4  Alkaline Phos 38 - 126 U/L 164(H) 184(H) 160(H)  AST 15 - 41 U/L 25 19 18   ALT 0 - 44 U/L 19 16 9    CBC Latest Ref Rng & Units 09/27/2021 09/26/2021 09/02/2020  WBC 4.0 - 10.5 K/uL 4.5 4.4 5.9  Hemoglobin 13.0 - 17.0 g/dL 12.2(L) 12.2(L) 11.6(L)  Hematocrit 39.0 - 52.0 % 37.5(L) 36.7(L) 36.4(L)  Platelets 150 - 400 K/uL 242 234 212   Lipid Panel     Component Value Date/Time   CHOL 168 02/01/2017 0845   TRIG 65 02/01/2017 0845   HDL 69 02/01/2017 0845   CHOLHDL 2.4 02/01/2017 0845   CHOLHDL 3.6 05/12/2014 0926   VLDL 19 05/12/2014 0926   LDLCALC 86 02/01/2017 0845   HEMOGLOBIN A1C Lab Results  Component Value Date   HGBA1C 5.5 10/31/2018   TSH No results for input(s): TSH in the last 8760 hours.  03/04/2020: Glucose 80, BUN/Cr 18/1.25. EGFR >60. Na/K 147/3.7.  H/H 10.8/34.6. MCV 93.8. Platelets 257 HbA1C N/A  2018: Chol 168, TG 65, HDL 69, LDL 86 TSH normal    Review of Systems  Constitutional: Negative for malaise/fatigue and weight gain.  Cardiovascular:  Negative for chest pain, claudication, dyspnea on exertion, leg swelling, near-syncope, orthopnea, palpitations, paroxysmal nocturnal dyspnea and syncope.  Respiratory:  Negative for shortness of breath.   Neurological:  Negative  for dizziness.       Vitals:   09/29/21 1027  BP: 136/78  Pulse: 86  Temp: 98.5 F (36.9 C)     Body mass index is 36.48 kg/m. Filed Weights   09/29/21 1027  Weight: 226 lb (102.5 kg)     Objective:   Physical Exam Vitals reviewed.  Constitutional:      General: He is not in acute distress.    Appearance: He is obese.  HENT:     Head: Normocephalic and atraumatic.  Neck:     Vascular: No JVD.  Cardiovascular:     Rate and Rhythm: Normal rate and regular rhythm.     Pulses: Intact distal pulses.     Heart sounds: Normal heart sounds, S1 normal and S2 normal. No murmur heard.   No gallop.  Pulmonary:     Effort: Pulmonary effort is normal. No respiratory distress.     Breath sounds: Normal breath sounds. No wheezing, rhonchi or rales.  Musculoskeletal:     Right lower leg: No edema.     Left lower leg: No edema.  Neurological:     Mental Status: He is alert.       Assessment & Recommendations:   75 y.o. African-American male [place] witness with stage IIb marginal zone lymphoma involving the left parotid gland, left cervical and mediastinal lymph nodes, rheumatoid arthritis, anemia, CAD with mild ischemia (04/2020)  Cardiac risk stratification: Patient's physical exam and EKG are unchanged compared to previous office visit. There is no clinical evidence of heart failure and EKG shows no evidence of acute ischemic changes.  Although is back pain limits patient's physical activity he reports good functional status.  Patient's previous stress test showed small sized, mild intensity, reversible perfusion defect in apical anteroseptal myocardium, however he has had not recurrence of chest pain or anginal equivalent in the last 1.5 years with medical therapy.  Discussed with patient risk vs. Benefit of repeat stress test. However, as his functional capacity is > 4 METS, he is asymptomatic from a cardiovascular standpoint, EKG is unchanged compared to 03/2020, and stress  test was relatively recent shared decision was not to delay surgery for further cardiac testing. Patient is aware that compared to others of his age and gender without his cardiovascular history he is at higher surgical risk, but overall he is relatively low risk and may proceed with surgery.   CAD: Mild prox CAD CCTA 2019. Mild anteroseptal ischemia stress test 04/2020. Patient has been asymptomatic with medical therapy  Start rosuvastatin 20 mg nightly. Patient had stopped atorvastatin due to memory loss concerns.  He is also not taking Asprin, will plan to resume after surgery as hemoglobin has improved since last visit  Hypertension: Relatively well controlled. Patient's back pain is likely contributing to to mild elevation in blood pressure.   Follow up in 3 months, sooner if needed.    Alethia Berthold, PA-C 09/29/2021, 12:03 PM Office: [phone]

## 2021-09-30 ENCOUNTER — Ambulatory Visit
Admission: RE | Admit: 2021-09-30 | Discharge: 2021-09-30 | Disposition: A | Discharge status: Home / Self Care | Payer: No Typology Code available for payment source | Source: Ambulatory Visit | Attending: Neurosurgery | Admitting: Neurosurgery

## 2021-09-30 DIAGNOSIS — S32009K Unspecified fracture of unspecified lumbar vertebra, subsequent encounter for fracture with nonunion: Secondary | ICD-10-CM

## 2021-10-02 ENCOUNTER — Encounter (HOSPITAL_COMMUNITY): Payer: Self-pay | Admitting: Neurosurgery

## 2021-10-02 ENCOUNTER — Inpatient Hospital Stay (HOSPITAL_COMMUNITY): Payer: No Typology Code available for payment source

## 2021-10-02 ENCOUNTER — Inpatient Hospital Stay (HOSPITAL_COMMUNITY): Payer: No Typology Code available for payment source | Admitting: Vascular Surgery

## 2021-10-02 ENCOUNTER — Inpatient Hospital Stay (HOSPITAL_COMMUNITY)
Admission: RE | Admit: 2021-10-02 | Discharge: 2021-10-20 | DRG: 453 | Disposition: A | Discharge status: Home Health | Payer: No Typology Code available for payment source | Source: Ambulatory Visit | Attending: Neurosurgery | Admitting: Neurosurgery

## 2021-10-02 ENCOUNTER — Inpatient Hospital Stay (HOSPITAL_COMMUNITY)
Admission: RE | Disposition: A | Discharge status: Home Health | Payer: Self-pay | Source: Ambulatory Visit | Attending: Neurosurgery

## 2021-10-02 ENCOUNTER — Inpatient Hospital Stay (HOSPITAL_COMMUNITY): Payer: No Typology Code available for payment source | Admitting: Certified Registered Nurse Anesthetist

## 2021-10-02 DIAGNOSIS — N401 Enlarged prostate with lower urinary tract symptoms: Secondary | ICD-10-CM | POA: Diagnosis not present

## 2021-10-02 DIAGNOSIS — D509 Iron deficiency anemia, unspecified: Secondary | ICD-10-CM | POA: Diagnosis present

## 2021-10-02 DIAGNOSIS — E876 Hypokalemia: Secondary | ICD-10-CM | POA: Diagnosis present

## 2021-10-02 DIAGNOSIS — E669 Obesity, unspecified: Secondary | ICD-10-CM | POA: Diagnosis present

## 2021-10-02 DIAGNOSIS — I4892 Unspecified atrial flutter: Secondary | ICD-10-CM | POA: Diagnosis not present

## 2021-10-02 DIAGNOSIS — C8510 Unspecified B-cell lymphoma, unspecified site: Secondary | ICD-10-CM | POA: Diagnosis present

## 2021-10-02 DIAGNOSIS — M4156 Other secondary scoliosis, lumbar region: Secondary | ICD-10-CM | POA: Diagnosis present

## 2021-10-02 DIAGNOSIS — A419 Sepsis, unspecified organism: Secondary | ICD-10-CM | POA: Diagnosis not present

## 2021-10-02 DIAGNOSIS — M48061 Spinal stenosis, lumbar region without neurogenic claudication: Secondary | ICD-10-CM | POA: Diagnosis present

## 2021-10-02 DIAGNOSIS — E8770 Fluid overload, unspecified: Secondary | ICD-10-CM | POA: Diagnosis present

## 2021-10-02 DIAGNOSIS — G250 Essential tremor: Secondary | ICD-10-CM | POA: Diagnosis present

## 2021-10-02 DIAGNOSIS — I480 Paroxysmal atrial fibrillation: Secondary | ICD-10-CM | POA: Diagnosis not present

## 2021-10-02 DIAGNOSIS — R6520 Severe sepsis without septic shock: Secondary | ICD-10-CM | POA: Diagnosis not present

## 2021-10-02 DIAGNOSIS — N138 Other obstructive and reflux uropathy: Secondary | ICD-10-CM | POA: Diagnosis present

## 2021-10-02 DIAGNOSIS — I10 Essential (primary) hypertension: Secondary | ICD-10-CM | POA: Diagnosis not present

## 2021-10-02 DIAGNOSIS — Z20822 Contact with and (suspected) exposure to covid-19: Secondary | ICD-10-CM | POA: Diagnosis present

## 2021-10-02 DIAGNOSIS — I471 Supraventricular tachycardia: Secondary | ICD-10-CM | POA: Diagnosis not present

## 2021-10-02 DIAGNOSIS — R0602 Shortness of breath: Secondary | ICD-10-CM

## 2021-10-02 DIAGNOSIS — R0609 Other forms of dyspnea: Secondary | ICD-10-CM | POA: Diagnosis not present

## 2021-10-02 DIAGNOSIS — M069 Rheumatoid arthritis, unspecified: Secondary | ICD-10-CM | POA: Diagnosis present

## 2021-10-02 DIAGNOSIS — D631 Anemia in chronic kidney disease: Secondary | ICD-10-CM | POA: Diagnosis present

## 2021-10-02 DIAGNOSIS — C8580 Other specified types of non-Hodgkin lymphoma, unspecified site: Secondary | ICD-10-CM | POA: Diagnosis present

## 2021-10-02 DIAGNOSIS — R5082 Postprocedural fever: Secondary | ICD-10-CM | POA: Diagnosis not present

## 2021-10-02 DIAGNOSIS — K219 Gastro-esophageal reflux disease without esophagitis: Secondary | ICD-10-CM | POA: Diagnosis present

## 2021-10-02 DIAGNOSIS — N1831 Chronic kidney disease, stage 3a: Secondary | ICD-10-CM | POA: Diagnosis present

## 2021-10-02 DIAGNOSIS — I4430 Unspecified atrioventricular block: Secondary | ICD-10-CM | POA: Diagnosis not present

## 2021-10-02 DIAGNOSIS — Y838 Other surgical procedures as the cause of abnormal reaction of the patient, or of later complication, without mention of misadventure at the time of the procedure: Secondary | ICD-10-CM | POA: Diagnosis present

## 2021-10-02 DIAGNOSIS — I129 Hypertensive chronic kidney disease with stage 1 through stage 4 chronic kidney disease, or unspecified chronic kidney disease: Secondary | ICD-10-CM | POA: Diagnosis present

## 2021-10-02 DIAGNOSIS — R509 Fever, unspecified: Secondary | ICD-10-CM

## 2021-10-02 DIAGNOSIS — J9811 Atelectasis: Secondary | ICD-10-CM | POA: Diagnosis not present

## 2021-10-02 DIAGNOSIS — Z888 Allergy status to other drugs, medicaments and biological substances status: Secondary | ICD-10-CM

## 2021-10-02 DIAGNOSIS — Z974 Presence of external hearing-aid: Secondary | ICD-10-CM

## 2021-10-02 DIAGNOSIS — Z8249 Family history of ischemic heart disease and other diseases of the circulatory system: Secondary | ICD-10-CM

## 2021-10-02 DIAGNOSIS — Z833 Family history of diabetes mellitus: Secondary | ICD-10-CM

## 2021-10-02 DIAGNOSIS — Z531 Procedure and treatment not carried out because of patient's decision for reasons of belief and group pressure: Secondary | ICD-10-CM | POA: Diagnosis present

## 2021-10-02 DIAGNOSIS — I97191 Other postprocedural cardiac functional disturbances following other surgery: Secondary | ICD-10-CM | POA: Diagnosis not present

## 2021-10-02 DIAGNOSIS — Z79899 Other long term (current) drug therapy: Secondary | ICD-10-CM

## 2021-10-02 DIAGNOSIS — I4510 Unspecified right bundle-branch block: Secondary | ICD-10-CM | POA: Diagnosis present

## 2021-10-02 DIAGNOSIS — M05741 Rheumatoid arthritis with rheumatoid factor of right hand without organ or systems involvement: Secondary | ICD-10-CM | POA: Diagnosis not present

## 2021-10-02 DIAGNOSIS — R748 Abnormal levels of other serum enzymes: Secondary | ICD-10-CM | POA: Diagnosis not present

## 2021-10-02 DIAGNOSIS — R609 Edema, unspecified: Secondary | ICD-10-CM | POA: Diagnosis not present

## 2021-10-02 DIAGNOSIS — Z6836 Body mass index (BMI) 36.0-36.9, adult: Secondary | ICD-10-CM | POA: Diagnosis not present

## 2021-10-02 DIAGNOSIS — E1122 Type 2 diabetes mellitus with diabetic chronic kidney disease: Secondary | ICD-10-CM | POA: Diagnosis present

## 2021-10-02 DIAGNOSIS — M2578 Osteophyte, vertebrae: Secondary | ICD-10-CM | POA: Diagnosis present

## 2021-10-02 DIAGNOSIS — N179 Acute kidney failure, unspecified: Secondary | ICD-10-CM | POA: Diagnosis present

## 2021-10-02 DIAGNOSIS — I491 Atrial premature depolarization: Secondary | ICD-10-CM | POA: Diagnosis not present

## 2021-10-02 DIAGNOSIS — G4733 Obstructive sleep apnea (adult) (pediatric): Secondary | ICD-10-CM | POA: Diagnosis present

## 2021-10-02 DIAGNOSIS — Z880 Allergy status to penicillin: Secondary | ICD-10-CM

## 2021-10-02 DIAGNOSIS — M05742 Rheumatoid arthritis with rheumatoid factor of left hand without organ or systems involvement: Secondary | ICD-10-CM | POA: Diagnosis not present

## 2021-10-02 DIAGNOSIS — M5126 Other intervertebral disc displacement, lumbar region: Secondary | ICD-10-CM | POA: Diagnosis present

## 2021-10-02 DIAGNOSIS — M5136 Other intervertebral disc degeneration, lumbar region: Secondary | ICD-10-CM | POA: Diagnosis present

## 2021-10-02 DIAGNOSIS — I4891 Unspecified atrial fibrillation: Secondary | ICD-10-CM | POA: Diagnosis not present

## 2021-10-02 DIAGNOSIS — M19042 Primary osteoarthritis, left hand: Secondary | ICD-10-CM | POA: Diagnosis present

## 2021-10-02 DIAGNOSIS — Z823 Family history of stroke: Secondary | ICD-10-CM

## 2021-10-02 DIAGNOSIS — Z87891 Personal history of nicotine dependence: Secondary | ICD-10-CM

## 2021-10-02 DIAGNOSIS — D62 Acute posthemorrhagic anemia: Secondary | ICD-10-CM | POA: Diagnosis not present

## 2021-10-02 DIAGNOSIS — N400 Enlarged prostate without lower urinary tract symptoms: Secondary | ICD-10-CM | POA: Diagnosis present

## 2021-10-02 DIAGNOSIS — H9190 Unspecified hearing loss, unspecified ear: Secondary | ICD-10-CM | POA: Diagnosis present

## 2021-10-02 DIAGNOSIS — M19041 Primary osteoarthritis, right hand: Secondary | ICD-10-CM | POA: Diagnosis present

## 2021-10-02 DIAGNOSIS — Z981 Arthrodesis status: Secondary | ICD-10-CM

## 2021-10-02 DIAGNOSIS — Z800 Family history of malignant neoplasm of digestive organs: Secondary | ICD-10-CM

## 2021-10-02 DIAGNOSIS — Z841 Family history of disorders of kidney and ureter: Secondary | ICD-10-CM

## 2021-10-02 DIAGNOSIS — Z419 Encounter for procedure for purposes other than remedying health state, unspecified: Secondary | ICD-10-CM

## 2021-10-02 DIAGNOSIS — I2510 Atherosclerotic heart disease of native coronary artery without angina pectoris: Secondary | ICD-10-CM | POA: Diagnosis present

## 2021-10-02 DIAGNOSIS — M7989 Other specified soft tissue disorders: Secondary | ICD-10-CM | POA: Diagnosis not present

## 2021-10-02 DIAGNOSIS — M960 Pseudarthrosis after fusion or arthrodesis: Secondary | ICD-10-CM | POA: Diagnosis present

## 2021-10-02 DIAGNOSIS — R338 Other retention of urine: Secondary | ICD-10-CM | POA: Diagnosis present

## 2021-10-02 DIAGNOSIS — E785 Hyperlipidemia, unspecified: Secondary | ICD-10-CM | POA: Diagnosis present

## 2021-10-02 LAB — GLUCOSE, CAPILLARY
Glucose-Capillary: 74 mg/dL (ref 70–99)
Glucose-Capillary: 107 mg/dL — ABNORMAL HIGH (ref 70–99)
Glucose-Capillary: 134 mg/dL — ABNORMAL HIGH (ref 70–99)

## 2021-10-02 SURGERY — POSTERIOR LUMBAR FUSION 2 WITH HARDWARE REMOVAL
Anesthesia: General | Site: Back

## 2021-10-02 MED ORDER — HYDROMORPHONE HCL 1 MG/ML IJ SOLN
INTRAMUSCULAR | Status: AC
  Filled 2021-10-02: qty 1

## 2021-10-02 MED ORDER — ACETAMINOPHEN 500 MG PO TABS
1000.0000 mg | ORAL_TABLET | Freq: Once | ORAL | Status: AC
  Administered 2021-10-02: 1000 mg via ORAL
  Filled 2021-10-02: qty 2

## 2021-10-02 MED ORDER — PANTOPRAZOLE SODIUM 40 MG IV SOLR
40.0000 mg | Freq: Every day | INTRAVENOUS | Status: DC
  Administered 2021-10-02: 40 mg via INTRAVENOUS
  Filled 2021-10-02: qty 40

## 2021-10-02 MED ORDER — ROCURONIUM BROMIDE 10 MG/ML (PF) SYRINGE
PREFILLED_SYRINGE | INTRAVENOUS | Status: AC
  Filled 2021-10-02: qty 10

## 2021-10-02 MED ORDER — LIDOCAINE 2% (20 MG/ML) 5 ML SYRINGE
INTRAMUSCULAR | Status: DC | PRN
  Administered 2021-10-02: 60 mg via INTRAVENOUS

## 2021-10-02 MED ORDER — ACETAMINOPHEN 650 MG RE SUPP: 650.0000 mg | RECTAL | Status: DC | PRN

## 2021-10-02 MED ORDER — 0.9 % SODIUM CHLORIDE (POUR BTL) OPTIME
TOPICAL | Status: DC | PRN
  Administered 2021-10-02: 1000 mL

## 2021-10-02 MED ORDER — OXYCODONE HCL 5 MG PO TABS
10.0000 mg | ORAL_TABLET | ORAL | Status: DC | PRN
  Administered 2021-10-03 – 2021-10-19 (×17): 10 mg via ORAL
  Filled 2021-10-02 (×19): qty 2

## 2021-10-02 MED ORDER — TAMSULOSIN HCL 0.4 MG PO CAPS
0.4000 mg | ORAL_CAPSULE | Freq: Every day | ORAL | Status: DC
  Administered 2021-10-02 – 2021-10-19 (×18): 0.4 mg via ORAL
  Filled 2021-10-02 (×18): qty 1

## 2021-10-02 MED ORDER — CHLORHEXIDINE GLUCONATE CLOTH 2 % EX PADS: 6.0000 | MEDICATED_PAD | Freq: Once | CUTANEOUS | Status: DC

## 2021-10-02 MED ORDER — FENTANYL CITRATE (PF) 250 MCG/5ML IJ SOLN
INTRAMUSCULAR | Status: AC
  Filled 2021-10-02: qty 5

## 2021-10-02 MED ORDER — PROPOFOL 10 MG/ML IV BOLUS
INTRAVENOUS | Status: DC | PRN
  Administered 2021-10-02: 100 mg via INTRAVENOUS

## 2021-10-02 MED ORDER — DEXAMETHASONE SODIUM PHOSPHATE 10 MG/ML IJ SOLN
INTRAMUSCULAR | Status: DC | PRN
  Administered 2021-10-02: 10 mg via INTRAVENOUS

## 2021-10-02 MED ORDER — PHENYLEPHRINE 40 MCG/ML (10ML) SYRINGE FOR IV PUSH (FOR BLOOD PRESSURE SUPPORT)
PREFILLED_SYRINGE | INTRAVENOUS | Status: AC
  Filled 2021-10-02: qty 10

## 2021-10-02 MED ORDER — ONDANSETRON HCL 4 MG PO TABS: 4.0000 mg | ORAL_TABLET | Freq: Four times a day (QID) | ORAL | Status: DC | PRN

## 2021-10-02 MED ORDER — SODIUM CHLORIDE 0.9% FLUSH: 3.0000 mL | INTRAVENOUS | Status: DC | PRN

## 2021-10-02 MED ORDER — BUPIVACAINE LIPOSOME 1.3 % IJ SUSP
INTRAMUSCULAR | Status: DC | PRN
  Administered 2021-10-02: 20 mL

## 2021-10-02 MED ORDER — ACETAMINOPHEN 325 MG PO TABS
650.0000 mg | ORAL_TABLET | ORAL | Status: DC | PRN
  Administered 2021-10-05 – 2021-10-12 (×12): 650 mg via ORAL
  Filled 2021-10-02 (×13): qty 2

## 2021-10-02 MED ORDER — ORAL CARE MOUTH RINSE: 15.0000 mL | Freq: Once | OROMUCOSAL | Status: AC

## 2021-10-02 MED ORDER — THROMBIN 20000 UNITS EX SOLR
CUTANEOUS | Status: DC | PRN
  Administered 2021-10-02: 20 mL via TOPICAL

## 2021-10-02 MED ORDER — THROMBIN 20000 UNITS EX KIT
PACK | CUTANEOUS | Status: DC | PRN
  Administered 2021-10-02: 20 mL via TOPICAL

## 2021-10-02 MED ORDER — HYDRALAZINE HCL 20 MG/ML IJ SOLN
10.0000 mg | INTRAMUSCULAR | Status: DC | PRN
  Administered 2021-10-03: 10 mg via INTRAVENOUS
  Filled 2021-10-02: qty 1

## 2021-10-02 MED ORDER — PROPOFOL 10 MG/ML IV BOLUS
INTRAVENOUS | Status: AC
  Filled 2021-10-02: qty 20

## 2021-10-02 MED ORDER — ONDANSETRON HCL 4 MG/2ML IJ SOLN
4.0000 mg | Freq: Four times a day (QID) | INTRAMUSCULAR | Status: DC | PRN
  Administered 2021-10-02 – 2021-10-03 (×2): 4 mg via INTRAVENOUS
  Filled 2021-10-02 (×2): qty 2

## 2021-10-02 MED ORDER — NITROGLYCERIN 0.4 MG SL SUBL: 0.4000 mg | SUBLINGUAL_TABLET | SUBLINGUAL | Status: DC | PRN

## 2021-10-02 MED ORDER — HYDROMORPHONE HCL 1 MG/ML IJ SOLN
0.2500 mg | INTRAMUSCULAR | Status: DC | PRN
  Administered 2021-10-02 (×4): 0.5 mg via INTRAVENOUS

## 2021-10-02 MED ORDER — BUPIVACAINE HCL (PF) 0.25 % IJ SOLN
INTRAMUSCULAR | Status: AC
  Filled 2021-10-02: qty 30

## 2021-10-02 MED ORDER — HYDROCHLOROTHIAZIDE 25 MG PO TABS
25.0000 mg | ORAL_TABLET | Freq: Every day | ORAL | Status: DC
  Administered 2021-10-03 – 2021-10-09 (×7): 25 mg via ORAL
  Filled 2021-10-02 (×8): qty 1

## 2021-10-02 MED ORDER — CEFAZOLIN SODIUM-DEXTROSE 2-4 GM/100ML-% IV SOLN
2.0000 g | Freq: Three times a day (TID) | INTRAVENOUS | Status: AC
  Administered 2021-10-02 – 2021-10-04 (×6): 2 g via INTRAVENOUS
  Filled 2021-10-02 (×6): qty 100

## 2021-10-02 MED ORDER — HYDROMORPHONE HCL 1 MG/ML IJ SOLN
0.5000 mg | INTRAMUSCULAR | Status: DC | PRN
  Administered 2021-10-03 – 2021-10-05 (×2): 0.5 mg via INTRAVENOUS
  Filled 2021-10-02 (×2): qty 0.5

## 2021-10-02 MED ORDER — CYCLOBENZAPRINE HCL 10 MG PO TABS
10.0000 mg | ORAL_TABLET | Freq: Three times a day (TID) | ORAL | Status: DC | PRN
  Administered 2021-10-03 – 2021-10-17 (×4): 10 mg via ORAL
  Filled 2021-10-02 (×5): qty 1

## 2021-10-02 MED ORDER — CHLORHEXIDINE GLUCONATE 0.12 % MT SOLN
OROMUCOSAL | Status: AC
  Administered 2021-10-02: 15 mL
  Filled 2021-10-02: qty 15

## 2021-10-02 MED ORDER — SUGAMMADEX SODIUM 500 MG/5ML IV SOLN
INTRAVENOUS | Status: AC
  Filled 2021-10-02: qty 5

## 2021-10-02 MED ORDER — GABAPENTIN 600 MG PO TABS
600.0000 mg | ORAL_TABLET | Freq: Two times a day (BID) | ORAL | Status: DC
  Administered 2021-10-02 – 2021-10-11 (×18): 600 mg via ORAL
  Filled 2021-10-02 (×18): qty 1

## 2021-10-02 MED ORDER — TRAMADOL-ACETAMINOPHEN 37.5-325 MG PO TABS
1.0000 | ORAL_TABLET | Freq: Four times a day (QID) | ORAL | Status: DC | PRN
  Administered 2021-10-05 – 2021-10-13 (×5): 1 via ORAL
  Filled 2021-10-02 (×5): qty 1

## 2021-10-02 MED ORDER — ALUM & MAG HYDROXIDE-SIMETH 200-200-20 MG/5ML PO SUSP
30.0000 mL | Freq: Four times a day (QID) | ORAL | Status: DC | PRN
  Administered 2021-10-10: 30 mL via ORAL
  Filled 2021-10-02: qty 30

## 2021-10-02 MED ORDER — PRIMIDONE 50 MG PO TABS
150.0000 mg | ORAL_TABLET | Freq: Every day | ORAL | Status: DC
  Administered 2021-10-03 – 2021-10-19 (×18): 150 mg via ORAL
  Filled 2021-10-02 (×21): qty 3

## 2021-10-02 MED ORDER — ONDANSETRON HCL 4 MG/2ML IJ SOLN
INTRAMUSCULAR | Status: AC
  Filled 2021-10-02: qty 4

## 2021-10-02 MED ORDER — AMISULPRIDE (ANTIEMETIC) 5 MG/2ML IV SOLN
INTRAVENOUS | Status: AC
  Filled 2021-10-02: qty 4

## 2021-10-02 MED ORDER — LIDOCAINE-EPINEPHRINE 1 %-1:100000 IJ SOLN
INTRAMUSCULAR | Status: AC
  Filled 2021-10-02: qty 1

## 2021-10-02 MED ORDER — LIDOCAINE 2% (20 MG/ML) 5 ML SYRINGE
INTRAMUSCULAR | Status: AC
  Filled 2021-10-02: qty 10

## 2021-10-02 MED ORDER — DEXAMETHASONE SODIUM PHOSPHATE 10 MG/ML IJ SOLN
INTRAMUSCULAR | Status: AC
  Filled 2021-10-02: qty 3

## 2021-10-02 MED ORDER — SODIUM CHLORIDE 0.9% FLUSH
3.0000 mL | Freq: Two times a day (BID) | INTRAVENOUS | Status: DC
  Administered 2021-10-02 – 2021-10-20 (×35): 3 mL via INTRAVENOUS

## 2021-10-02 MED ORDER — FENTANYL CITRATE (PF) 250 MCG/5ML IJ SOLN
INTRAMUSCULAR | Status: DC | PRN
  Administered 2021-10-02: 25 ug via INTRAVENOUS
  Administered 2021-10-02 (×3): 50 ug via INTRAVENOUS
  Administered 2021-10-02: 25 ug via INTRAVENOUS
  Administered 2021-10-02 (×2): 50 ug via INTRAVENOUS

## 2021-10-02 MED ORDER — AMISULPRIDE (ANTIEMETIC) 5 MG/2ML IV SOLN
10.0000 mg | Freq: Once | INTRAVENOUS | Status: AC
  Administered 2021-10-02: 10 mg via INTRAVENOUS

## 2021-10-02 MED ORDER — MENTHOL 3 MG MT LOZG
1.0000 | LOZENGE | OROMUCOSAL | Status: DC | PRN
  Administered 2021-10-19: 3 mg via ORAL
  Filled 2021-10-02: qty 9

## 2021-10-02 MED ORDER — PHENYLEPHRINE HCL-NACL 20-0.9 MG/250ML-% IV SOLN
INTRAVENOUS | Status: DC | PRN
  Administered 2021-10-02: 20 ug/min via INTRAVENOUS

## 2021-10-02 MED ORDER — SUGAMMADEX SODIUM 500 MG/5ML IV SOLN
INTRAVENOUS | Status: DC | PRN
  Administered 2021-10-02: 100 mg via INTRAVENOUS
  Administered 2021-10-02: 300 mg via INTRAVENOUS

## 2021-10-02 MED ORDER — LIDOCAINE-EPINEPHRINE 1 %-1:100000 IJ SOLN
INTRAMUSCULAR | Status: DC | PRN
  Administered 2021-10-02: 9 mL

## 2021-10-02 MED ORDER — METHOCARBAMOL 750 MG PO TABS: 750.0000 mg | ORAL_TABLET | Freq: Three times a day (TID) | ORAL | Status: DC | PRN

## 2021-10-02 MED ORDER — THROMBIN 20000 UNITS EX SOLR
CUTANEOUS | Status: AC
  Filled 2021-10-02: qty 20000

## 2021-10-02 MED ORDER — CHLORHEXIDINE GLUCONATE 0.12 % MT SOLN: 15.0000 mL | Freq: Once | OROMUCOSAL | Status: AC

## 2021-10-02 MED ORDER — ROCURONIUM BROMIDE 10 MG/ML (PF) SYRINGE
PREFILLED_SYRINGE | INTRAVENOUS | Status: DC | PRN
  Administered 2021-10-02: 20 mg via INTRAVENOUS
  Administered 2021-10-02: 70 mg via INTRAVENOUS
  Administered 2021-10-02: 30 mg via INTRAVENOUS
  Administered 2021-10-02: 10 mg via INTRAVENOUS
  Administered 2021-10-02: 30 mg via INTRAVENOUS
  Administered 2021-10-02: 20 mg via INTRAVENOUS
  Administered 2021-10-02: 10 mg via INTRAVENOUS
  Administered 2021-10-02: 20 mg via INTRAVENOUS

## 2021-10-02 MED ORDER — ROCURONIUM BROMIDE 10 MG/ML (PF) SYRINGE
PREFILLED_SYRINGE | INTRAVENOUS | Status: AC
  Filled 2021-10-02: qty 30

## 2021-10-02 MED ORDER — SODIUM CHLORIDE 0.9 % IV SOLN
250.0000 mL | INTRAVENOUS | Status: DC
  Administered 2021-10-02 – 2021-10-03 (×2): 250 mL via INTRAVENOUS

## 2021-10-02 MED ORDER — PHENYLEPHRINE 40 MCG/ML (10ML) SYRINGE FOR IV PUSH (FOR BLOOD PRESSURE SUPPORT)
PREFILLED_SYRINGE | INTRAVENOUS | Status: DC | PRN
  Administered 2021-10-02 (×2): 80 ug via INTRAVENOUS
  Administered 2021-10-02 (×2): 120 ug via INTRAVENOUS
  Administered 2021-10-02 (×2): 80 ug via INTRAVENOUS
  Administered 2021-10-02: 40 ug via INTRAVENOUS

## 2021-10-02 MED ORDER — METOPROLOL SUCCINATE ER 100 MG PO TB24
100.0000 mg | ORAL_TABLET | Freq: Every day | ORAL | Status: DC
  Administered 2021-10-03 – 2021-10-20 (×18): 100 mg via ORAL
  Filled 2021-10-02 (×19): qty 1

## 2021-10-02 MED ORDER — ROSUVASTATIN CALCIUM 20 MG PO TABS
20.0000 mg | ORAL_TABLET | Freq: Every day | ORAL | Status: DC
  Administered 2021-10-03 – 2021-10-09 (×6): 20 mg via ORAL
  Filled 2021-10-02 (×8): qty 1

## 2021-10-02 MED ORDER — LACTATED RINGERS IV SOLN: INTRAVENOUS | Status: DC

## 2021-10-02 MED ORDER — LACTATED RINGERS IV SOLN
INTRAVENOUS | Status: DC | PRN
Start: 2021-10-02 — End: 2021-10-02

## 2021-10-02 MED ORDER — PHENOL 1.4 % MT LIQD: 1.0000 | OROMUCOSAL | Status: DC | PRN

## 2021-10-02 MED ORDER — ONDANSETRON HCL 4 MG/2ML IJ SOLN
INTRAMUSCULAR | Status: DC | PRN
  Administered 2021-10-02: 4 mg via INTRAVENOUS

## 2021-10-02 MED ORDER — CEFAZOLIN SODIUM-DEXTROSE 2-4 GM/100ML-% IV SOLN
2.0000 g | INTRAVENOUS | Status: AC
  Administered 2021-10-02: 2 g via INTRAVENOUS
  Filled 2021-10-02: qty 100

## 2021-10-02 MED ORDER — THROMBIN (RECOMBINANT) 20000 UNITS EX SOLR
CUTANEOUS | Status: AC
  Filled 2021-10-02: qty 20000

## 2021-10-02 MED ORDER — BUPIVACAINE LIPOSOME 1.3 % IJ SUSP
INTRAMUSCULAR | Status: AC
  Filled 2021-10-02: qty 20

## 2021-10-02 SURGICAL SUPPLY — 82 items
APL SKNCLS STERI-STRIP NONHPOA (GAUZE/BANDAGES/DRESSINGS) ×1
BAG COUNTER SPONGE SURGICOUNT (BAG) ×2 IMPLANT
BAG SPNG CNTER NS LX DISP (BAG) ×1
BASKET BONE COLLECTION (BASKET) ×2 IMPLANT
BENZOIN TINCTURE PRP APPL 2/3 (GAUZE/BANDAGES/DRESSINGS) ×2 IMPLANT
BIT DRILL 5.0/4.0 (BIT) ×1 IMPLANT
BLADE CLIPPER SURG (BLADE) ×2 IMPLANT
BLADE SURG 11 STRL SS (BLADE) ×2 IMPLANT
BONE VIVIGEN FORMABLE 5.4CC (Bone Implant) ×2 IMPLANT
BUR CUTTER 7.0 ROUND (BURR) ×2 IMPLANT
BUR MATCHSTICK NEURO 3.0 LAGG (BURR) ×2 IMPLANT
CANISTER SUCT 3000ML PPV (MISCELLANEOUS) ×2 IMPLANT
CAP LOCKING (Cap) ×18 IMPLANT
CAP LOCKING 5.5 CREO (Cap) ×9 IMPLANT
CARTRIDGE OIL MAESTRO DRILL (MISCELLANEOUS) ×1 IMPLANT
CLSR STERI-STRIP ANTIMIC 1/2X4 (GAUZE/BANDAGES/DRESSINGS) ×2 IMPLANT
CNTNR URN SCR LID CUP LEK RST (MISCELLANEOUS) ×1 IMPLANT
CONT SPEC 4OZ STRL OR WHT (MISCELLANEOUS) ×2
COVER BACK TABLE 24X17X13 BIG (DRAPES) IMPLANT
COVER BACK TABLE 60X90IN (DRAPES) ×2 IMPLANT
DECANTER SPIKE VIAL GLASS SM (MISCELLANEOUS) ×2 IMPLANT
DERMABOND ADVANCED (GAUZE/BANDAGES/DRESSINGS) ×1
DERMABOND ADVANCED .7 DNX12 (GAUZE/BANDAGES/DRESSINGS) ×1 IMPLANT
DIFFUSER DRILL AIR PNEUMATIC (MISCELLANEOUS) ×2 IMPLANT
DRAPE C-ARM 42X72 X-RAY (DRAPES) ×4 IMPLANT
DRAPE C-ARMOR (DRAPES) IMPLANT
DRAPE HALF SHEET 40X57 (DRAPES) ×2 IMPLANT
DRAPE LAPAROTOMY 100X72X124 (DRAPES) ×2 IMPLANT
DRAPE SURG 17X23 STRL (DRAPES) IMPLANT
DRILL 5.0/4.0 (BIT) ×2
DRSG OPSITE 4X5.5 SM (GAUZE/BANDAGES/DRESSINGS) ×2 IMPLANT
DRSG OPSITE POSTOP 4X6 (GAUZE/BANDAGES/DRESSINGS) IMPLANT
DURAPREP 26ML APPLICATOR (WOUND CARE) ×2 IMPLANT
ELECT BLADE 4.0 EZ CLEAN MEGAD (MISCELLANEOUS) ×2
ELECT REM PT RETURN 9FT ADLT (ELECTROSURGICAL) ×2
ELECTRODE BLDE 4.0 EZ CLN MEGD (MISCELLANEOUS) ×1 IMPLANT
ELECTRODE REM PT RTRN 9FT ADLT (ELECTROSURGICAL) ×1 IMPLANT
EVACUATOR 3/16  PVC DRAIN (DRAIN) ×2
EVACUATOR 3/16 PVC DRAIN (DRAIN) ×1 IMPLANT
GAUZE 4X4 16PLY ~~LOC~~+RFID DBL (SPONGE) ×6 IMPLANT
GAUZE SPONGE 4X4 12PLY STRL (GAUZE/BANDAGES/DRESSINGS) IMPLANT
GAUZE SPONGE 4X4 12PLY STRL LF (GAUZE/BANDAGES/DRESSINGS) ×2 IMPLANT
GLOVE EXAM NITRILE XL STR (GLOVE) IMPLANT
GLOVE SURG ENC MOIS LTX SZ7 (GLOVE) ×2 IMPLANT
GLOVE SURG ENC MOIS LTX SZ8 (GLOVE) ×4 IMPLANT
GLOVE SURG UNDER LTX SZ8.5 (GLOVE) ×4 IMPLANT
GLOVE SURG UNDER POLY LF SZ7 (GLOVE) ×10 IMPLANT
GOWN STRL REUS W/ TWL LRG LVL3 (GOWN DISPOSABLE) ×4 IMPLANT
GOWN STRL REUS W/ TWL XL LVL3 (GOWN DISPOSABLE) ×2 IMPLANT
GOWN STRL REUS W/TWL 2XL LVL3 (GOWN DISPOSABLE) IMPLANT
GOWN STRL REUS W/TWL LRG LVL3 (GOWN DISPOSABLE) ×8
GOWN STRL REUS W/TWL XL LVL3 (GOWN DISPOSABLE) ×4
GRAFT BONE PROTEIOS LRG 5CC (Orthopedic Implant) ×2 IMPLANT
KIT BASIN OR (CUSTOM PROCEDURE TRAY) ×2 IMPLANT
KIT GRAFTMAG DEL NEURO DISP (NEUROSURGERY SUPPLIES) IMPLANT
KIT POSITION SURG JACKSON T1 (MISCELLANEOUS) IMPLANT
KIT TURNOVER KIT B (KITS) ×2 IMPLANT
MILL MEDIUM DISP (BLADE) ×2 IMPLANT
NEEDLE HYPO 21X1.5 SAFETY (NEEDLE) ×2 IMPLANT
NEEDLE HYPO 25X1 1.5 SAFETY (NEEDLE) ×2 IMPLANT
NS IRRIG 1000ML POUR BTL (IV SOLUTION) ×2 IMPLANT
OIL CARTRIDGE MAESTRO DRILL (MISCELLANEOUS) ×2
PACK LAMINECTOMY NEURO (CUSTOM PROCEDURE TRAY) ×2 IMPLANT
PAD ARMBOARD 7.5X6 YLW CONV (MISCELLANEOUS) ×6 IMPLANT
PATTIES SURGICAL 1X1 (DISPOSABLE) ×2 IMPLANT
ROD CREO 100MM SPINAL (Rod) ×4 IMPLANT
SCREW PDCL CREO 7.5X6.5X35 (Screw) ×4 IMPLANT
SHAFT CREO 30MM (Neuro Prosthesis/Implant) ×8 IMPLANT
SPACER SUSTAIN-RT 8X26X10 8D (Spacer) ×8 IMPLANT
SPONGE SURGIFOAM ABS GEL 100 (HEMOSTASIS) ×2 IMPLANT
SPONGE T-LAP 4X18 ~~LOC~~+RFID (SPONGE) ×2 IMPLANT
STRIP CLOSURE SKIN 1/2X4 (GAUZE/BANDAGES/DRESSINGS) ×4 IMPLANT
SUT VIC AB 0 CT1 18XCR BRD8 (SUTURE) ×1 IMPLANT
SUT VIC AB 0 CT1 8-18 (SUTURE) ×2
SUT VIC AB 2-0 CT1 18 (SUTURE) ×2 IMPLANT
SUT VIC AB 4-0 PS2 27 (SUTURE) ×2 IMPLANT
SYR 20ML LL LF (SYRINGE) ×2 IMPLANT
TOWEL GREEN STERILE (TOWEL DISPOSABLE) ×2 IMPLANT
TOWEL GREEN STERILE FF (TOWEL DISPOSABLE) ×2 IMPLANT
TRAY FOLEY MTR SLVR 16FR STAT (SET/KITS/TRAYS/PACK) ×2 IMPLANT
TULIP CREP AMP 5.5MM (Orthopedic Implant) ×12 IMPLANT
WATER STERILE IRR 1000ML POUR (IV SOLUTION) ×2 IMPLANT

## 2021-10-02 NOTE — Transfer of Care (Addendum)
Immediate Anesthesia Transfer of Care Note  Patient: Bryan [name]  Procedure(s) Performed: LUMBAR TWO-THREE, LUMBAR THREE-FOUR DECOMPRESSION AND FUSION, EXPLORATION OF  LUMBAR FOUR TO SACRAL ONE FUSION, REDO LUMBAR FIVE-SACRAL ONE FUSION (Back)  Patient Location: PACU  Anesthesia Type:General  Level of Consciousness: drowsy and patient cooperative  Airway & Oxygen Therapy: Patient Spontanous Breathing and Patient connected to face mask oxygen  Post-op Assessment: Report given to RN, Post -op Vital signs reviewed and stable and Patient moving all extremities X 4  Post vital signs: Reviewed and stable  Last Vitals:  Vitals Value Taken Time  BP 155/83   Temp    Pulse 62   Resp 14   SpO2 100%     Last Pain:  Vitals:   10/02/21 1107  TempSrc:   PainSc: 4       Patients Stated Pain Goal: 3 (29/79/89 2119)  Complications: No notable events documented.

## 2021-10-02 NOTE — Op Note (Signed)
Preoperative diagnosis: Degenerative disc disease degenerative scoliosis lumbar spinal stenosis L2-3 L3-4 with herniated nuclear process L2-3  2.  Pseudoarthrosis L5-S1  Postoperative diagnosis: Same  Procedure: Exploration fusion with removal of hardware L4-S1 with removal of bilateral S1 screws  2.  Complete decompressive laminectomy L2-3 L3-4 with complete medial facetectomies and radical foraminotomies of the L2-L3 and L4 nerve roots bilaterally in excess and requiring more work to be and would be needed with a standard interbody fusion  3.  Cortical screw fixation with bilateral cortical screws placed at L2 and L3 and replacement of cortical screws at S1 utilizing the goat globus Creo amp modular cortical screw set  4.  Posterior lumbar interbody fusion utilizing the globus insert and rotate titanium cages packed with locally harvested autograft mixed with vivigen and partialis  5.  Posterior lateral arthrodesis L5-S1 utilizing the locally harvested autograft mixed  Surgeon: Bryan [name]  Assistant: Bryan [name]  Anesthesia: General  EBL: 9 L  HPI: 75 year old gentleman with progressive worsening back pain bilateral hip and leg pain work-up revealed progressive degenerative disc disease L2-3 L3-4 with severe spinal stenosis at both levels and possible pseudoarthrosis at L5-S1.  Due to patient progression of clinical syndrome imaging findings and failed conservative treatment I recommended reexploration fusion move of hardware decompressive laminectomy interbody fusions at L2-3 and L3-4 possible redo posterior lateral arthrodesis at L5-S1.  I extensively went over the risks and benefits of this operation with him as well as perioperative course expectations of outcome and alternatives of surgery and he understood and agreed to proceed forward.  Operative procedure: Patient was brought into the OR was [facility] general anesthesia positioned prone on the Wilson frame his back was prepped and  draped in routine sterile fashion his incision was opened up and extended cephalad caudally scar tissue was dissected free and the old hardware was identified at L4 S1 I then remove the knots remove the rods the S1 screws were loose so I remove those as well then exposed the spinal laminar complexes at L2-3 and L3-4 and remove the spinous processes at L2 and L3 performed a complete central decompressive laminectomy at L2-3 and L3-4 with complete medial facetectomies and radical laminotomies of the L2, L3 and L4 nerve roots bilaterally.  Marked hourglass compression of thecal sac and severe degenerative disc disease was primarily the pathology here as well as marked degenerative scoliosis.  Disc bases were markedly collapsed but they were entered and with sequential distraction I opened up the disc bases at both levels inserted performed complete discectomy prepared the endplates inserted cages bilaterally with extensive mount of autograft mix packed centrally between the cages this helped opened up the disc base reduce the deformity.  I then placed bilateral cortical screws under fluoroscopy at L2 and L3 then also repositioned and replaced the S1 cortical screws under fluoroscopy then I aggressively decorticated the facet joints at L5-S1 the sacral ala as well as the TPs packed an extensive mount of autograft mix posterior laterally at L5-S1.  Then I also noted the left L4 screw to be loose so I remove that 1 then assembled the heads and placed rods anchored everything in place compressed L3 against L4 and compressed L2 and L3.  Injected Exparel in the fascia reinspected the foramen to confirm patency no migration of graft material placed a Hemovac drain and closed the wound in layers with opted Vicryl and a running 4 subcuticular Dermabond benzoin Steri-Strips and a sterile dressing was applied patient recovery in  stable condition.  At the end the case all needle count sponge counts were correct.

## 2021-10-02 NOTE — Anesthesia Postprocedure Evaluation (Signed)
Anesthesia Post Note  Patient: [name]  Procedure(s) Performed: LUMBAR TWO-THREE, LUMBAR THREE-FOUR DECOMPRESSION AND FUSION, EXPLORATION OF  LUMBAR FOUR TO SACRAL ONE FUSION, REDO LUMBAR FIVE-SACRAL ONE FUSION (Back)     Patient location during evaluation: PACU Anesthesia Type: General Level of consciousness: sedated Pain management: pain level controlled Vital Signs Assessment: post-procedure vital signs reviewed and stable Respiratory status: spontaneous breathing and respiratory function stable Cardiovascular status: stable Postop Assessment: no apparent nausea or vomiting Anesthetic complications: yes   Encounter Notable Events  Notable Event Outcome Phase Comment  Nausea Resolved in Lab In Recovery     Last Vitals:  Vitals:   10/02/21 1830 10/02/21 1845  BP: (!) 164/81 (!) 161/81  Pulse: 85 83  Resp: 18 15  Temp:    SpO2: 100% 100%    Last Pain:  Vitals:   10/02/21 1845  TempSrc:   PainSc: 5                  Sem Mccaughey DANIEL

## 2021-10-02 NOTE — Anesthesia Procedure Notes (Signed)
Procedure Name: Intubation Date/Time: 10/02/2021 12:36 PM Performed by: Harden Mo, CRNA Pre-anesthesia Checklist: Patient identified, Emergency Drugs available, Suction available and Patient being monitored Patient Re-evaluated:Patient Re-evaluated prior to induction Oxygen Delivery Method: Circle System Utilized Preoxygenation: Pre-oxygenation with 100% oxygen Induction Type: IV induction Ventilation: Mask ventilation without difficulty Laryngoscope Size: Miller and 3 Grade View: Grade I Tube type: Oral Tube size: 7.5 mm Number of attempts: 1 Airway Equipment and Method: Stylet and Oral airway Placement Confirmation: ETT inserted through vocal cords under direct vision, positive ETCO2 and breath sounds checked- equal and bilateral Secured at: 23 cm Tube secured with: Tape Dental Injury: Teeth and Oropharynx as per pre-operative assessment

## 2021-10-02 NOTE — Anesthesia Preprocedure Evaluation (Addendum)
Anesthesia Evaluation  Patient identified by MRN, date of birth, ID band Patient awake    Reviewed: Allergy & Precautions, H&P , NPO status , Patient's Chart, lab work & pertinent test results, reviewed documented beta blocker date and time   Airway Mallampati: II  TM Distance: >3 FB Neck ROM: Full    Dental no notable dental hx. (+) Edentulous Lower, Dental Advisory Given   Pulmonary asthma , former smoker,    Pulmonary exam normal breath sounds clear to auscultation       Cardiovascular hypertension, Pt. on medications and Pt. on home beta blockers  Rhythm:Regular Rate:Normal     Neuro/Psych negative neurological ROS  negative psych ROS   GI/Hepatic Neg liver ROS, GERD  Medicated,  Endo/Other  diabetesMorbid obesity  Renal/GU negative Renal ROS  negative genitourinary   Musculoskeletal  (+) Arthritis , Osteoarthritis,    Abdominal   Peds  Hematology  (+) Blood dyscrasia, anemia , REFUSES BLOOD PRODUCTS, JEHOVAH'S WITNESS  Anesthesia Other Findings   Reproductive/Obstetrics negative OB ROS                            Anesthesia Physical Anesthesia Plan  ASA: 3  Anesthesia Plan: General   Post-op Pain Management: Tylenol PO (pre-op)   Induction: Intravenous  PONV Risk Score and Plan: 3 and Ondansetron, Dexamethasone and Treatment may vary due to age or medical condition  Airway Management Planned: Oral ETT  Additional Equipment:   Intra-op Plan:   Post-operative Plan: Extubation in OR  Informed Consent: I have reviewed the patients History and Physical, chart, labs and discussed the procedure including the risks, benefits and alternatives for the proposed anesthesia with the patient or authorized representative who has indicated his/her understanding and acceptance.     Dental advisory given  Plan Discussed with: CRNA  Anesthesia Plan Comments:         Anesthesia  Quick Evaluation

## 2021-10-02 NOTE — Progress Notes (Signed)
CPAP set up at bedside with nasal mask. Before placing, patient felt nausea and vomited a small amount. States he feels better. RN aware. Will offer Zofran. CPAP held at this time.

## 2021-10-02 NOTE — H&P (Signed)
Bryan [name] is an 75 y.o. male.   Chief Complaint: Back bilateral hip and leg pain HPI: 75 year old gentleman longstanding back and bilateral hip and leg pain previously undergone L4-S1 fusion and said progressive worsening back and bilateral leg pain with neurogenic claudication refractory to conservative treatment work-up is revealed progressive spondylosis at L2-3 and L3-4 above his previous fusion with disc herniation at L2-3 severe foraminal stenosis at both levels.  Also follow-up CT scan showed lucency around his S1 screws and possible pseudoarthrosis at L5-S1.  Due to his progression of clinical syndrome imaging findings and failed conservative treatment I recommended exploration fusion move of hardware redo posterior lateral arthrodesis L5-S1 interbody fusions at L2-3 and L3-4 with extension of his hardware.  Extensive gone over the risks and benefits of this procedure with him as well as perioperative course expectations of outcome and alternatives to surgery and he understood and agreed to proceed forward.  Past Medical History:  Diagnosis Date   Anemia    Arthritis    hands, knees, cervical area. Back pain. Rheumatoid arthritis- weekly injections.   BPH (benign prostatic hypertrophy)    Cancer (HCC)    Diabetes (HCC)    Essential tremor    GERD (gastroesophageal reflux disease)    reports for indigestion he uses mustard    Hearing deficit    wears hearing aids bilateral   HTN (hypertension)    Obesity    OSA (obstructive sleep apnea)    Refusal of blood transfusions as patient is Jehovah's Witness    Rheumatoid arthritis ([place])    Right bundle branch block    history of   Seizures ([place])    AS A CHILD. only esential tremors now.   Sleep apnea    cpap - settings at 9 per patient    Ulcer     Past Surgical History:  Procedure Laterality Date   ANTERIOR CERVICAL DECOMP/DISCECTOMY FUSION N/A 03/30/2013   Procedure: ANTERIOR CERVICAL DECOMPRESSION/DISCECTOMY FUSION 2 LEVELS;   Surgeon: Otilio Connors, MD;  Location: [facility];  Service: Neurosurgery;  Laterality: N/A;  C4-5 C5-6 Anterior cervical decompression/diskectomy/fusion/Allograft/Plate   CHOLECYSTECTOMY N/A 12/07/2013   Procedure: LAPAROSCOPIC CHOLECYSTECTOMY WITH INTRAOPERATIVE CHOLANGIOGRAM;  Surgeon: Adin Hector, MD;  Location: [facility];  Service: General;  Laterality: N/A;   COLONOSCOPY     DECOMPRESSIVE LUMBAR LAMINECTOMY LEVEL 2 N/A 02/15/2015   Procedure: COMPLETE DECOMPRESSIVE LUMBAR LAMINECTOMY L4-L5/ FORAMINOTOMY TO L4 NERVE ROOT AND L5 NERVE ROOT BILATERALLY;  Surgeon: [name], MD;  Location: [facility];  Service: Orthopedics;  Laterality: N/A;   EYE SURGERY     right, growth excision   LUMBAR LAMINECTOMY/DECOMPRESSION MICRODISCECTOMY Left 01/18/2016   Procedure:  DECOMPRESSION L4-L5 MICRODISCECTOMY L4-L5 ON LEFT FOR SPINAL STENOSIS;  Surgeon: [name], MD;  Location: [facility];  Service: Orthopedics;  Laterality: Left;   PAROTIDECTOMY Left 01/24/2018   Procedure: INCISIONAL BIOPSY OF LEFT PAROTID    MASS;  Surgeon: Helayne Seminole, MD;  Location: [facility];  Service: ENT;  Laterality: Left;   SPINE SURGERY     TONSILLECTOMY     VASECTOMY     WRIST GANGLION EXCISION Left     Family History  Problem Relation Age of Onset   Colon cancer Maternal Grandfather    Kidney disease Brother    Hypertension Brother    Diabetes Brother    Stroke Maternal Grandmother    Diabetes Mother    Hypertension Mother    CAD Father  died of MI at age 19   Hypertension Father    Diabetes Paternal Aunt        x 4 aunts   Social History:  reports that he quit smoking about 48 years ago. His smoking use included cigarettes. He has a 7.00 pack-year smoking history. He has never used smokeless tobacco. He reports current alcohol use. He reports that he does not use drugs.  Allergies:  Allergies  Allergen Reactions   Adalimumab     Other reaction(s): Lymphoma   Ampicillin Nausea Only   Terazosin  Other (See Comments)   Lisinopril Other (See Comments) and Cough    Cough   Other Other (See Comments)    BLOOD PRODUCT REFUSAL (patient is a Jehovah's Witness)     Medications Prior to Admission  Medication Sig Dispense Refill   gabapentin (NEURONTIN) 600 MG tablet Take 600 mg by mouth 2 (two) times daily.     hydrochlorothiazide (HYDRODIURIL) 25 MG tablet Take 1 tablet (25 mg total) by mouth daily. 90 tablet 0   methocarbamol (ROBAXIN) 750 MG tablet Take 750 mg by mouth in the morning and at bedtime.     metoprolol succinate (TOPROL-XL) 100 MG 24 hr tablet TAKE 1 TABLET BY MOUTH ONCE DAILY WITH  OR  IMMEDIATELY  FOLLOWING  A  MEAL (Patient taking differently: TAKE 1 TABLET BY MOUTH ONCE DAILY WITH  OR  IMMEDIATELY  FOLLOWING  A  MEAL) 90 tablet 2   nitroGLYCERIN (NITROSTAT) 0.4 MG SL tablet Place 0.4 mg under the tongue every 5 (five) minutes as needed for chest pain.     primidone (MYSOLINE) 50 MG tablet Take 150 mg by mouth in the morning and at bedtime.     rosuvastatin (CRESTOR) 20 MG tablet Take 1 tablet (20 mg total) by mouth daily. 30 tablet 3   Tamsulosin HCl (FLOMAX) 0.4 MG CAPS Take 0.4 mg by mouth at bedtime.     traMADol-acetaminophen (ULTRACET) 37.5-325 MG tablet Take 1 tablet by mouth in the morning and at bedtime.     riTUXimab (RITUXAN) 100 MG/10ML injection as needed.      Results for orders placed or performed during the hospital encounter of 10/02/21 (from the past 48 hour(s))  Glucose, capillary     Status: None   Collection Time: 10/02/21 10:41 AM  Result Value Ref Range   Glucose-Capillary 74 70 - 99 mg/dL    Comment: Glucose reference range applies only to samples taken after fasting for at least 8 hours.   CT LUMBAR SPINE WO CONTRAST  Result Date: 09/30/2021 CLINICAL DATA:  75 year old male with low back pain. Prior surgery. Evidence of S1 screw loosening last year. EXAM: CT LUMBAR SPINE WITHOUT CONTRAST TECHNIQUE: Multidetector CT imaging of the lumbar spine  was performed without intravenous contrast administration. Multiplanar CT image reconstructions were also generated. COMPARISON:  CT lumbar spine 09/07/2020. FINDINGS: Segmentation: Normal, same numbering system used last year. Alignment: Grade 1 anterolisthesis of L4 on L5 and straightening of lumbar lordosis elsewhere is stable from last year. There is subtle mild dextroconvex lumbar spine curvature. Chronic retrolisthesis at both L2-L3 and L3-L4. Vertebrae: Postoperative details are below. Chronic interbody ankylosis related to flowing anterior endplate osteophytes at T11-T12 and L1-L2. No acute osseous abnormality identified. Intact visible sacrum and SI joints. Paraspinal and other soft tissues: Mild Aortoiliac calcified atherosclerosis. Stable, negative visible noncontrast abdominal viscera. Stable postoperative changes to the posterior lumbar paraspinal soft tissues. Disc levels: Visible lower thoracic levels through L1-L2: Stable  and negative. L2-L3: Chronic severe disc space loss. Less vacuum disc compared to last year. Bulky circumferential disc osteophyte complex appears mildly progressed along with posterior element hypertrophy. New or increased spinal stenosis and left lateral recess stenosis, mild-to-moderate. Stable moderate left and mild right L2 foraminal stenosis. L3-L4: Chronic severe disc space loss and mild retrolisthesis. Less vacuum disc compared to last year. Bulky circumferential disc osteophyte complex and moderate to severe facet and ligament flavum hypertrophy both appear mildly progressed since last year. Up to moderate associated spinal stenosis appears increased on series 3, image 79. Moderate to severe left L3 foraminal stenosis is stable. L4-L5: Prior decompression and fusion. Hardware appears stable aside from increased conspicuity of lucency along the left L5 pedicle screw (series 6, image 92). Mild endplate subsidence appears stable. Scant if any arthrodesis at this level. L5-S1:  Prior decompression and fusion. Chronic lucency along both S1 pedicle screws. No interbody implant. Continued interbody vacuum phenomena with underlying severe disc space loss and bulky circumferential disc osteophyte complex. No convincing arthrodesis. Mild to moderate multifactorial L5 foraminal stenosis is stable and greater on the left. IMPRESSION: 1. Prior decompression and fusion at L4-L5 and L5-S1 with chronic loosening of both S1 pedicle screws, and early loosening also of the left L5 pedicle screw. Scant if any arthrodesis at L4-L5, and no convincing arthrodesis at L5-S1. 2. Advanced degeneration at both L2-L3 and L3-L4 appears somewhat progressed from the CT last year, with moderate multifactorial spinal stenosis suspected at L3-L4. Neural foraminal stenosis at those levels appears stable. 3. 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 Electronically Signed   By: Genevie Ann M.D.   On: 09/30/2021 12:16    Review of Systems  Musculoskeletal:  Positive for back pain.  Neurological:  Positive for numbness.   Blood pressure (!) 170/87, pulse 75, temperature 98.2 F (36.8 C), temperature source Oral, resp. rate 18, height 5' 5.5" (1.664 m), weight 101.6 kg, SpO2 100 %. Physical Exam HENT:     Head: Normocephalic.     Nose: Nose normal.     Mouth/Throat:     Mouth: Mucous membranes are moist.  Eyes:     Pupils: Pupils are equal, round, and reactive to light.  Cardiovascular:     Rate and Rhythm: Normal rate.  Pulmonary:     Effort: Pulmonary effort is normal.  Abdominal:     General: Abdomen is flat.  Musculoskeletal:        General: Normal range of motion.  Skin:    General: Skin is warm.  Neurological:     General: No focal deficit present.     Mental Status: He is alert.     Comments: Strength is 5-5 iliopsoas, quads, hamstrings, gastroc, tibialis, and EHL.     Assessment/Plan 75 year old presents for expiration fusion move the hardware extension L2-3 L3-4 possible redo  fusion L5-S1  Elaina Hoops, MD 10/02/2021, 11:37 AM

## 2021-10-03 LAB — BASIC METABOLIC PANEL
Anion gap: 9 (ref 5–15)
BUN: 36 mg/dL — ABNORMAL HIGH (ref 8–23)
CO2: 27 mmol/L (ref 22–32)
Calcium: 7.6 mg/dL — ABNORMAL LOW (ref 8.9–10.3)
Chloride: 99 mmol/L (ref 98–111)
Creatinine, Ser: 2.72 mg/dL — ABNORMAL HIGH (ref 0.61–1.24)
GFR, Estimated: 23 mL/min — ABNORMAL LOW (ref >60)
Glucose, Bld: 139 mg/dL — ABNORMAL HIGH (ref 70–99)
Potassium: 4 mmol/L (ref 3.5–5.1)
Sodium: 135 mmol/L (ref 135–145)

## 2021-10-03 LAB — CBC WITH DIFFERENTIAL/PLATELET
Abs Immature Granulocytes: 0.05 10*3/uL (ref 0.00–0.07)
Basophils Absolute: 0 10*3/uL (ref 0.0–0.1)
Basophils Relative: 0 %
Eosinophils Absolute: 0 10*3/uL (ref 0.0–0.5)
Eosinophils Relative: 0 %
HCT: 27 % — ABNORMAL LOW (ref 39.0–52.0)
Hemoglobin: 8.8 g/dL — ABNORMAL LOW (ref 13.0–17.0)
Immature Granulocytes: 1 %
Lymphocytes Relative: 9 %
Lymphs Abs: 0.9 10*3/uL (ref 0.7–4.0)
MCH: 31.7 pg (ref 26.0–34.0)
MCHC: 32.6 g/dL (ref 30.0–36.0)
MCV: 97.1 fL (ref 80.0–100.0)
Monocytes Absolute: 1.2 10*3/uL — ABNORMAL HIGH (ref 0.1–1.0)
Monocytes Relative: 12 %
Neutro Abs: 8.1 10*3/uL — ABNORMAL HIGH (ref 1.7–7.7)
Neutrophils Relative %: 78 %
Platelets: 187 10*3/uL (ref 150–400)
RBC: 2.78 MIL/uL — ABNORMAL LOW (ref 4.22–5.81)
RDW: 13.8 % (ref 11.5–15.5)
WBC: 10.3 10*3/uL (ref 4.0–10.5)
nRBC: 0 % (ref 0.0–0.2)

## 2021-10-03 MED ORDER — PANTOPRAZOLE SODIUM 40 MG PO TBEC
40.0000 mg | DELAYED_RELEASE_TABLET | Freq: Every day | ORAL | Status: DC
  Administered 2021-10-03 – 2021-10-19 (×17): 40 mg via ORAL
  Filled 2021-10-03 (×17): qty 1

## 2021-10-03 MED FILL — Thrombin (Recombinant) For Soln 20000 Unit: CUTANEOUS | Qty: 1 | Status: AC

## 2021-10-03 MED FILL — Thrombin For Soln 20000 Unit: CUTANEOUS | Qty: 1 | Status: AC

## 2021-10-03 NOTE — Progress Notes (Signed)
Placed patient on CPAP via nasal mask. 8.5 cm H20 with 1L O2 bleed in. RN aware

## 2021-10-03 NOTE — Evaluation (Signed)
Physical Therapy Evaluation Patient Details Name: Bryan [name] MRN: [id number] DOB: 01-15-1946 Today's Date: 10/03/2021  History of Present Illness  75 yo male s/p L2-4 decompression and fusion, exploration of L4-S1 fusion, redo L5-S1 fusion on 11/22. PMH includes OA, BPH, cancer, DM, obesity, RA, seizures, ACDF 2014, lap chole, lumbar surgeries 2016-2017.  Clinical Impression   Pt presents with generalized weakness, decreased knowledge and application of spinal precautions, mild difficulty performing mobility tasks post-op, impaired activity tolerance. Pt to benefit from acute PT to address deficits. Pt ambulated short hallway distance with RW, requiring close guard to light assist to steady and help with bed mobility. PT to progress mobility as tolerated, and will continue to follow acutely.         Recommendations for follow up therapy are one component of a multi-disciplinary discharge planning process, led by the attending physician.  Recommendations may be updated based on patient status, additional functional criteria and insurance authorization.  Follow Up Recommendations Follow physician's recommendations for discharge plan and follow up therapies    Assistance Recommended at Discharge Intermittent Supervision/Assistance  Functional Status Assessment Patient has had a recent decline in their functional status and demonstrates the ability to make significant improvements in function in a reasonable and predictable amount of time.  Equipment Recommendations  Rolling walker (2 wheels);BSC/3in1    Recommendations for Other Services       Precautions / Restrictions Precautions Precautions: Fall;Back Precaution Booklet Issued: Yes (comment) Precaution Comments: OT administered handout post-PT session; PT reviewed BLT rules, log roll in/out of bed Required Braces or Orthoses: Spinal Brace Spinal Brace: Lumbar corset;Applied in sitting position Restrictions Weight Bearing  Restrictions: No      Mobility  Bed Mobility Overal bed mobility: Needs Assistance Bed Mobility: Rolling;Sidelying to Sit Rolling: Min assist Sidelying to sit: Min assist       General bed mobility comments: assist for log roll technique, increased time and use of bedrail to assist in rolling    Transfers Overall transfer level: Needs assistance Equipment used: Rolling walker (2 wheels) Transfers: Sit to/from Stand Sit to Stand: Min assist           General transfer comment: assist for initial rise and steadying, cues for hand placement when rising/sitting    Ambulation/Gait Ambulation/Gait assistance: Min guard Gait Distance (Feet): 90 Feet Assistive device: Rolling walker (2 wheels) Gait Pattern/deviations: Step-through pattern;Decreased stride length;Trunk flexed Gait velocity: decr     General Gait Details: close guard for safety, cues for upright posture, placement in RW  Stairs            Wheelchair Mobility    Modified Rankin (Stroke Patients Only)       Balance Overall balance assessment: Needs assistance;History of Falls Sitting-balance support: No upper extremity supported;Feet supported Sitting balance-Leahy Scale: Fair     Standing balance support: Bilateral upper extremity supported;During functional activity Standing balance-Leahy Scale: Poor Standing balance comment: reliant on RW for steadying                             Pertinent Vitals/Pain Pain Assessment: 0-10 Pain Score: 9  Pain Location: back Pain Descriptors / Indicators: Sore;Discomfort Pain Intervention(s): Limited activity within patient's tolerance;Monitored during session;Repositioned    Home Living Family/patient expects to be discharged to:: Private residence Living Arrangements: Spouse/significant other Available Help at Discharge: Family;Available 24 hours/day Type of Home: House Home Access: Stairs to enter   [employer] of Steps:  1    Home Layout: One level Home Equipment: None      Prior Function Prior Level of Function : Independent/Modified Independent;Driving             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, newspaper   Dominant Hand: Right    Extremity/Trunk Assessment   Upper Extremity Assessment Upper Extremity Assessment: Defer to OT evaluation    Lower Extremity Assessment Lower Extremity Assessment: Generalized weakness    Cervical / Trunk Assessment Cervical / Trunk Assessment: Back Surgery  Communication   Communication: HOH  Cognition Arousal/Alertness: Awake/alert Behavior During Therapy: WFL for tasks assessed/performed Overall Cognitive Status: Within Functional Limits for tasks assessed Area of Impairment: Safety/judgement                         Safety/Judgement: Decreased awareness of safety;Decreased awareness of deficits              General Comments      Exercises     Assessment/Plan    PT Assessment Patient needs continued PT services  PT Problem List Decreased strength;Decreased mobility;Decreased activity tolerance;Decreased balance;Decreased knowledge of use of DME;Pain;Decreased knowledge of precautions;Decreased safety awareness       PT Treatment Interventions DME instruction;Therapeutic activities;Gait training;Therapeutic exercise;Patient/family education;Balance training;Stair training;Functional mobility training;Neuromuscular re-education    PT Goals (Current goals can be found in the Care Plan section)  Acute Rehab PT Goals PT Goal Formulation: With patient Time For Goal Achievement: 10/17/21 Potential to Achieve Goals: Good    Frequency Min 5X/week   Barriers to discharge        Co-evaluation               AM-PAC PT "6 Clicks" Mobility  Outcome Measure Help needed turning from your back to your side while in a flat bed without using bedrails?: A Little Help needed moving from lying on your back to sitting on  the side of a flat bed without using bedrails?: A Little Help needed moving to and from a bed to a chair (including a wheelchair)?: A Little Help needed standing up from a chair using your arms (e.g., wheelchair or bedside chair)?: A Little Help needed to walk in hospital room?: A Little Help needed climbing 3-5 steps with a railing? : A Lot 6 Click Score: 17    End of Session Equipment Utilized During Treatment: Back brace Activity Tolerance: Patient tolerated treatment well Patient left: in chair;with call bell/phone within reach;with chair alarm set Nurse Communication: Mobility status PT Visit Diagnosis: Other abnormalities of gait and mobility (R26.89);Muscle weakness (generalized) (M62.81)    Time: 8099-8338 PT Time Calculation (min) (ACUTE ONLY): 24 min   Charges:   PT Evaluation $PT Eval Low Complexity: 1 Low PT Treatments $Gait Training: 8-22 mins      Stacie Glaze, PT DPT Acute Rehabilitation Services Pager [phone]  Office [phone]   [place] 10/03/2021, 10:35 AM

## 2021-10-03 NOTE — Progress Notes (Signed)
Ortho tech was called about Aspen brace for patient.  Ortho tech brought brace to bedside and fitted it on patient and educated him on proper wear.

## 2021-10-03 NOTE — Progress Notes (Signed)
Orthopedic Tech Progress Note Patient Details:  Bryan [name] 12-Aug-1946 [id number]  Ortho Devices Type of Ortho Device: Lumbar corsett Ortho Device/Splint Interventions: Ordered, Application, Adjustment   Post Interventions Patient Tolerated: Well Instructions Provided: Care of device, Adjustment of device  Karolee Stamps 10/03/2021, 5:03 AM

## 2021-10-03 NOTE — Progress Notes (Signed)
Patient refused CPAP for the night  

## 2021-10-03 NOTE — Evaluation (Signed)
Occupational Therapy Evaluation Patient Details Name: Bryan [name] MRN: [id number] DOB: [birth year] Today's Date: 10/03/2021   History of Present Illness 75 yo male s/p L2-4 decompression and fusion, exploration of L4-S1 fusion, redo L5-S1 fusion on 11/22. PMH includes OA, BPH, cancer, DM, obesity, RA, seizures, ACDF 2014, lap chole, lumbar surgeries 2016-2017.   Clinical Impression   Patient is s/p back surgery resulting in functional limitations due to the deficits listed below (see OT problem list). Pt currently needs instructions repeated and handout provided. Pt using AE for LB dressing with fair return demo. Pt needs mod cues during dressing to use AE. Pt fatigued with recent muscle relaxer and returning to supine after session.  Patient will benefit from skilled OT acutely to increase independence and safety with ADLS to allow discharge [place].       Recommendations for follow up therapy are one component of a multi-disciplinary discharge planning process, led by the attending physician.  Recommendations may be updated based on patient status, additional functional criteria and insurance authorization.   Follow Up Recommendations  Home health OT    Assistance Recommended at Discharge Intermittent Supervision/Assistance  Functional Status Assessment  Patient has had a recent decline in their functional status and demonstrates the ability to make significant improvements in function in a reasonable and predictable amount of time.  Equipment Recommendations  BSC/3in1;Other (comment) (RW)    Recommendations for Other Services       Precautions / Restrictions Precautions Precautions: Fall;Back Precaution Booklet Issued: Yes (comment) Precaution Comments: educated on back precautions with adls and handout provided Required Braces or Orthoses: Spinal Brace Spinal Brace: Lumbar corset;Applied in sitting position Restrictions Weight Bearing Restrictions: No      Mobility Bed  Mobility Overal bed mobility: Needs Assistance Bed Mobility: Sit to Supine;Rolling Rolling: Min assist Sidelying to sit: Min assist   Sit to supine: Mod assist   General bed mobility comments: pt requires mod cues for sequence and mod (A) to elevate bil LE onto bed surface    Transfers Overall transfer level: Needs assistance Equipment used: Rolling walker (2 wheels) Transfers: Sit to/from Stand Sit to Stand: Mod assist           General transfer comment: educated to scoot forward and then power up from chair. pt reports that worked better and also increased pain with transitional movements      Balance Overall balance assessment: Needs assistance;History of Falls Sitting-balance support: Bilateral upper extremity supported;Feet supported Sitting balance-Leahy Scale: Fair     Standing balance support: Bilateral upper extremity supported;Reliant on assistive device for balance;During functional activity Standing balance-Leahy Scale: Poor Standing balance comment: reliant on RW for steadying                           ADL either performed or assessed with clinical judgement   ADL Overall ADL's : Needs assistance/impaired Eating/Feeding: Modified independent   Grooming: Wash/dry face;Oral care;Min guard;Sitting Grooming Details (indicate cue type and reason): pt explained at home sitting for adls and shaving Upper Body Bathing: Min guard;Sitting   Lower Body Bathing: Moderate assistance;With adaptive equipment   Upper Body Dressing : Min guard;Sitting   Lower Body Dressing: Moderate assistance;With adaptive equipment Lower Body Dressing Details (indicate cue type and reason): attempting this session with reacher and personal PJ pants Toilet Transfer: Minimal assistance;Rolling walker (2 wheels);BSC/3in1           Functional mobility during ADLs: Minimal assistance;Rolling walker (2  wheels)       Vision Baseline Vision/History: 1 Wears glasses        Perception     Praxis      Pertinent Vitals/Pain Pain Assessment: Faces Pain Score: 9  Faces Pain Scale: Hurts little more Pain Location: back Pain Descriptors / Indicators: Sore;Discomfort Pain Intervention(s): Premedicated before session;Repositioned (reports fatigued after muscle relaxer)     Hand Dominance Right   Extremity/Trunk Assessment Upper Extremity Assessment Upper Extremity Assessment: Overall WFL for tasks assessed   Lower Extremity Assessment Lower Extremity Assessment: Defer to PT evaluation   Cervical / Trunk Assessment Cervical / Trunk Assessment: Back Surgery   Communication Communication Communication: HOH   Cognition Arousal/Alertness: Awake/alert Behavior During Therapy: WFL for tasks assessed/performed Overall Cognitive Status: Within Functional Limits for tasks assessed Area of Impairment: Safety/judgement                         Safety/Judgement: Decreased awareness of safety;Decreased awareness of deficits           General Comments  educated on brace don and doff with min (A). return demonstrated during session    Exercises     Shoulder Instructions      Home Living Family/patient expects to be discharged to:: Private residence Living Arrangements: Spouse/significant other Available Help at Discharge: Family;Available 24 hours/day Type of Home: House Home Access: Stairs to enter [employer] of Steps: 1   Home Layout: One level     Bathroom Shower/Tub: Teacher, early years/pre: Standard Bathroom Accessibility: Yes   Home Equipment: None   Additional Comments: wife works and can flex her work schedule some. pt reports wife is off friday - sunday. 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: Decreased  strength;Decreased activity tolerance;Impaired balance (sitting and/or standing);Decreased cognition;Decreased safety awareness;Decreased knowledge of use of DME or AE;Decreased knowledge of precautions;Obesity;Pain      OT Treatment/Interventions: Self-care/ADL training;Therapeutic exercise;Neuromuscular education;Energy conservation;DME and/or AE instruction;Manual therapy;Therapeutic activities;Cognitive remediation/compensation;Patient/family education;Balance training    OT Goals(Current goals can be found in the care plan section) Acute Rehab OT Goals Patient Stated Goal: to take a nap Time For Goal Achievement: 10/17/21 Potential to Achieve Goals: Good  OT Frequency: Min 2X/week   Barriers to D/C:    reports wife still works       Co-evaluation              AM-PAC OT "6 Clicks" Daily Activity     Outcome Measure Help from another person eating meals?: A Little Help from another person taking care of personal grooming?: A Little Help from another person toileting, which includes using toliet, bedpan, or urinal?: A Little Help from another person bathing (including washing, rinsing, drying)?: A Little Help from another person to put on and taking off regular upper body clothing?: A Little Help from another person to put on and taking off regular lower body clothing?: A Little 6 Click Score: 18   End of Session Equipment Utilized During Treatment: Back brace;Rolling walker (2 wheels) Nurse Communication: Mobility status;Precautions  Activity Tolerance: Patient tolerated treatment well Patient left: in bed;with bed alarm set;with call bell/phone within reach  OT Visit Diagnosis: Unsteadiness on feet (R26.81);Muscle weakness (  generalized) (M62.81);Pain                Time: 1001-1027 OT Time Calculation (min): 26 min Charges:  OT General Charges $OT Visit: 1 Visit OT Evaluation $OT Eval Moderate Complexity: 1 Mod OT Treatments $Self Care/Home Management : 8-22  mins   [place], OTR/L  Acute Rehabilitation Services Pager: [phone] Office: [phone] .   Jeri Modena 10/03/2021, 12:08 PM

## 2021-10-03 NOTE — Progress Notes (Signed)
Bryan [name] was spitting up clear foamy phlegm this morning after surgery.  Patient relates it to the new CPAP mask he is wearing.  RN educated that it is normal after surgery and it is mostly likely due to irration from the intubation.  Nasal CPAP mask and machine were removed and patient placed back on 2 L [place];  Pt was also given a yanker hooked to suction and educated on how to use it correctly.

## 2021-10-03 NOTE — Progress Notes (Signed)
Subjective: Patient reports  doing well no radicular pain condition of back pain  Objective: Vital signs in last 24 hours: Temp:  [97.8 F (36.6 C)-98.4 F (36.9 C)] 98.2 F (36.8 C) (11/22 0359) Pulse Rate:  [75-101] 98 (11/22 0359) Resp:  [11-20] 20 (11/22 0359) BP: (127-184)/(66-101) 127/77 (11/22 0359) SpO2:  [98 %-100 %] 99 % (11/22 0359) Weight:  [101.6 kg] 101.6 kg (11/21 1041)  Intake/Output from previous day: 11/21 0701 - 11/22 0700 In: 3250 [P.O.:640; I.V.:2310; IV Piggyback:300] Out: 2665 [Urine:905; Drains:260; Blood:1500] Intake/Output this shift: No intake/output data recorded.  Strength 5 out of 5  Lab Results: No results for input(s): WBC, HGB, HCT, PLT in the last 72 hours. BMET No results for input(s): NA, K, CL, CO2, GLUCOSE, BUN, CREATININE, CALCIUM in the last 72 hours.  Studies/Results: DG Lumbar Spine 2-3 Views  Result Date: 10/02/2021 CLINICAL DATA:  Lumbar fusion. EXAM: LUMBAR SPINE - 2-3 VIEW COMPARISON:  Preoperative CT 09/30/2021 FINDINGS: Two fluoroscopic spot views of the lumbar spine obtained in the operating room in frontal and lateral projection. Pedicle rods have been removed. New pedicle screws at L2 and L3 with interbody spacers. L4, L5, and S1 pedicle screws are seen. Interbody spacers in place. Fluoroscopy time 59 seconds. Dose 52.88 mGy. IMPRESSION: Intraoperative fluoroscopy for lumbar surgery. Electronically Signed   By: Keith Rake M.D.   On: 10/02/2021 18:55   DG C-Arm 1-60 Min-No Report  Result Date: 10/02/2021 Fluoroscopy was utilized by the requesting physician.  No radiographic interpretation.   DG C-Arm 1-60 Min-No Report  Result Date: 10/02/2021 Fluoroscopy was utilized by the requesting physician.  No radiographic interpretation.   DG C-Arm 1-60 Min-No Report  Result Date: 10/02/2021 Fluoroscopy was utilized by the requesting physician.  No radiographic interpretation.   DG C-Arm 1-60 Min-No Report  Result  Date: 10/02/2021 Fluoroscopy was utilized by the requesting physician.  No radiographic interpretation.    Assessment/Plan: Postop day 1 posterior lumbar interbody fusions L2-3 L3-4 posterolateral arthrodesis L5-S1 doing very well significant provement preoperative leg pain condition of back pain work with physical therapy and Occupational Therapy continue Hemovac drain for now check CBC  LOS: 1 day     Elaina Hoops 10/03/2021, 8:22 AM

## 2021-10-04 MED ORDER — POLYSACCHARIDE IRON COMPLEX 150 MG PO CAPS: 150.0000 mg | ORAL_CAPSULE | Freq: Every day | ORAL | 5 refills | Status: DC

## 2021-10-04 MED ORDER — B-12 1000 MCG SL SUBL: 2000.0000 ug | SUBLINGUAL_TABLET | Freq: Every day | SUBLINGUAL | 5 refills | Status: DC

## 2021-10-04 NOTE — Progress Notes (Signed)
Physical Therapy Treatment Patient Details Name: Bryan [name] MRN: [id number] DOB: 09/07/1946 Today's Date: 10/04/2021   History of Present Illness 75 yo male s/p L2-4 decompression and fusion, exploration of L4-S1 fusion, redo L5-S1 fusion on 11/22. PMH includes OA, BPH, cancer, DM, obesity, RA, seizures, ACDF 2014, lap chole, lumbar surgeries 2016-2017.    PT Comments    Pt complaining of moderate pain with mobility, overall tolerance limiting. Pt ambulatory for hallway distance with RW, overall requiring close guard to light physical assist and mod cuing for form/safety during mobility. Pt too fatigued to continue practicing mobility, including stair navigation. PT to continue to follow.     Recommendations for follow up therapy are one component of a multi-disciplinary discharge planning process, led by the attending physician.  Recommendations may be updated based on patient status, additional functional criteria and insurance authorization.  Follow Up Recommendations  Follow physician's recommendations for discharge plan and follow up therapies     Assistance Recommended at Discharge Intermittent Supervision/Assistance  Equipment Recommendations  Rolling walker (2 wheels);BSC/3in1    Recommendations for Other Services       Precautions / Restrictions Precautions Precautions: Fall;Back Precaution Comments: recalled 2/3 precautions Required Braces or Orthoses: Spinal Brace Spinal Brace: Lumbar corset;Applied in sitting position Restrictions Weight Bearing Restrictions: No     Mobility  Bed Mobility Overal bed mobility: Needs Assistance Bed Mobility: Rolling;Sidelying to Sit Rolling: Min assist Sidelying to sit: Min assist       General bed mobility comments: assist for log roll and trunk elevation to upright sitting, mod cuing for sequencing.    Transfers Overall transfer level: Needs assistance Equipment used: Rolling walker (2 wheels) Transfers: Sit to/from  Stand Sit to Stand: Min assist           General transfer comment: Assist for initial rise and steady, cues for hand placement when rising/sitting.    Ambulation/Gait Ambulation/Gait assistance: Min guard Gait Distance (Feet): 130 Feet Assistive device: Rolling walker (2 wheels) Gait Pattern/deviations: Step-through pattern;Decreased stride length;Trunk flexed Gait velocity: decr     General Gait Details: mod cues for proximity of RW, upright posture. Worsening forward flexed posture with fatigue.   Stairs             Wheelchair Mobility    Modified Rankin (Stroke Patients Only)       Balance Overall balance assessment: Needs assistance;History of Falls Sitting-balance support: Bilateral upper extremity supported;Feet supported Sitting balance-Leahy Scale: Fair     Standing balance support: Bilateral upper extremity supported;Reliant on assistive device for balance;During functional activity Standing balance-Leahy Scale: Poor                              Cognition Arousal/Alertness: Awake/alert Behavior During Therapy: WFL for tasks assessed/performed Overall Cognitive Status: Within Functional Limits for tasks assessed Area of Impairment: Safety/judgement;Memory                     Memory: Decreased recall of precautions   Safety/Judgement: Decreased awareness of deficits              Exercises      General Comments        Pertinent Vitals/Pain Pain Assessment: 0-10 Pain Score: 7  Pain Location: back Pain Descriptors / Indicators: Sore;Discomfort Pain Intervention(s): Limited activity within patient's tolerance;Monitored during session;Repositioned    Home Living  Prior Function            PT Goals (current goals can now be found in the care plan section) Acute Rehab PT Goals PT Goal Formulation: With patient Time For Goal Achievement: 10/17/21 Potential to Achieve Goals:  Good Progress towards PT goals: Progressing toward goals    Frequency    Min 5X/week      PT Plan Current plan remains appropriate    Co-evaluation              AM-PAC PT "6 Clicks" Mobility   Outcome Measure  Help needed turning from your back to your side while in a flat bed without using bedrails?: A Little Help needed moving from lying on your back to sitting on the side of a flat bed without using bedrails?: A Little Help needed moving to and from a bed to a chair (including a wheelchair)?: A Little Help needed standing up from a chair using your arms (e.g., wheelchair or bedside chair)?: A Little Help needed to walk in hospital room?: A Little Help needed climbing 3-5 steps with a railing? : A Little 6 Click Score: 18    End of Session Equipment Utilized During Treatment: Back brace Activity Tolerance: Patient tolerated treatment well Patient left: in chair;with call bell/phone within reach;with chair alarm set Nurse Communication: Mobility status PT Visit Diagnosis: Other abnormalities of gait and mobility (R26.89);Muscle weakness (generalized) (M62.81)     Time: 3009-2330 PT Time Calculation (min) (ACUTE ONLY): 19 min  Charges:  $Gait Training: 8-22 mins                     Stacie Glaze, PT DPT Acute Rehabilitation Services Pager [phone]  Office [phone]    [place] 10/04/2021, 10:52 AM

## 2021-10-04 NOTE — Progress Notes (Signed)
HEMATOLOGY ONCOLOGY CLINIC NOTE  Date of Service: 10/04/21    Patient Care Team: [name], FNP as PCP - General (Family Medicine) Croitoru, Dani Gobble, MD as PCP - Cardiology (Cardiology) [name] as Physician Assistant (Emergency Medicine) [name], MD as Consulting Physician (Orthopedic Surgery) [name], MD as Consulting Physician (Oncology) Helayne Seminole, MD as Consulting Physician (Otolaryngology) Edrick Kins, DPM as Consulting Physician (Podiatry)  CHIEF COMPLAINTS F/u for  Marginal Zone lymphoma    HISTORY OF PRESENTING ILLNESS:   Bryan [name] 75 y.o. male is here because of a referral from ENT [name] regarding a concern of MALT lymphoma.   He is accompanied today by 4 members of his family. The pt reports that he is doing well overall. The pt's PCP is [name]. The pt sees [name] for his Rheumatology. The pt went off of humira a month before his mass appeared two months. The pt takes Methotrexate 15 mg once each week for his rheumatoid arthritis. He notes that he took humira for 8-10 months, and hasn't taken it in about 3 months. He began taking prednisone 15mg  q day, 2 months ago.   He reports having high blood pressure and takes amlodipine, metoprolol for it. He reports having had back and neck surgery for discs.  He also notes having essential tremors in his hands for which he takes primidone.  He reports having run out of flomax. He has had some PTSD from his time in the [place] war.  He notes he has had drenching night sweats for the last 15 years that are of intermittent frequency. He denies any other medical issues.    He first noticed swelling of his left cheek about two months ago. He notes that it was "hard like a rock." He was placed on prednisone for about a month after going to the doctor, with some relief. He then had his needle Bx recorded below.   He reports gum pain that began this  morning, and also reports that his tongue feels as if it has bumps on it. He reports taking folic acid 1mg  each day.    On 10/29/17 the pt had a neck CT revealing Multifocal left parotid lesion. Multiple ill-defined enhancing nodules in the left parotid gland. Asymmetric enhancing lymph nodes in the left neck are not pathologically enlarged however given the asymmetry and the left parotid lesion, they could represent neoplastic spread of parotid malignancy or lymphoma. Tissue sampling recommended.  Of note prior to the patient's visit, pt has had a biopsy of his left parotid gland completed on 12/03/17 with results revealing Atypical lymphoid proliferation. 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 -   B cell clonality study was positive for clonality.  On review of systems, pt reports gum pain, fatigue (not recent), occasional night sweats, ankle pain, and denies abdominal pains, back pain, flank pain, leg swelling, swollen or painful joints beside ankles.   On PMHx the pt has had rheumatoid arthritis for 6 years and HTN. He notes that he has anemia and is a Acupuncturist Witness I- which he notes means he would not want to consider blood products  On Surgical Hx the pt had back surgery. On Social Hx the pt quit smoking in 1976 after smoking about 8 cigarettes each day for 15 years. He notes drinking ETOH  about once a week. He denies chemical or radiation exposure, except for agent orange.  On Family Hx the pt notes high blood pressure, DM, but denies autoimmune conditions, cancers, or blood disorders.    INTERVAL HISTORY:   Bryan [name] is here for a scheduled follow-up of his marginal zone lymphoma. The patient's last visit with [place] was on 09/02/2020 .  The pt reports that he continues Rituxan infusions every 13 weeks for his rheumatoid arthritis which she feels  has been reasonably controlled.  Notes some intermittent issues with joint pains related to his rheumatoid arthritis. No fevers no chills no night sweats.  No new lumps or bumps noted. No new abdominal pain or distention. No significant change in bowel habits.  Lab results today (09/26/2021) of CBC w/diff hemoglobin at 12.2, WBC count of 4.4k and platelets of 234k  CMP stable with creatinine of 1.38. Ferritin 38 B12 209. Patient reports he has upcoming lumbar spinal fusion surgery 10/02/2021.  REVIEW OF SYSTEMS:   .10 Point review of Systems was done is negative except as noted above.  MEDICAL HISTORY:  Past Medical History:  Diagnosis Date   Anemia    Arthritis    hands, knees, cervical area. Back pain. Rheumatoid arthritis- weekly injections.   BPH (benign prostatic hypertrophy)    Cancer (HCC)    Diabetes (HCC)    Essential tremor    GERD (gastroesophageal reflux disease)    reports for indigestion he uses mustard    Hearing deficit    wears hearing aids bilateral   HTN (hypertension)    Obesity    OSA (obstructive sleep apnea)    Refusal of blood transfusions as patient is Jehovah's Witness    Rheumatoid arthritis ([place])    Right bundle branch block    history of   Seizures ([place])    AS A CHILD. only esential tremors now.   Sleep apnea    cpap - settings at 9 per patient    Ulcer   JEHOVA's WITNESS  SURGICAL HISTORY: Past Surgical History:  Procedure Laterality Date   ANTERIOR CERVICAL DECOMP/DISCECTOMY FUSION N/A 03/30/2013   Procedure: ANTERIOR CERVICAL DECOMPRESSION/DISCECTOMY FUSION 2 LEVELS;  Surgeon: Otilio Connors, MD;  Location: [facility];  Service: Neurosurgery;  Laterality: N/A;  C4-5 C5-6 Anterior cervical decompression/diskectomy/fusion/Allograft/Plate   CHOLECYSTECTOMY N/A 12/07/2013   Procedure: LAPAROSCOPIC CHOLECYSTECTOMY WITH INTRAOPERATIVE CHOLANGIOGRAM;  Surgeon: Adin Hector, MD;  Location: [facility];  Service: General;  Laterality: N/A;    COLONOSCOPY     DECOMPRESSIVE LUMBAR LAMINECTOMY LEVEL 2 N/A 02/15/2015   Procedure: COMPLETE DECOMPRESSIVE LUMBAR LAMINECTOMY L4-L5/ FORAMINOTOMY TO L4 NERVE ROOT AND L5 NERVE ROOT BILATERALLY;  Surgeon: [name], MD;  Location: [facility];  Service: Orthopedics;  Laterality: N/A;   EYE SURGERY     right, growth excision   LUMBAR LAMINECTOMY/DECOMPRESSION MICRODISCECTOMY Left 01/18/2016   Procedure:  DECOMPRESSION L4-L5 MICRODISCECTOMY L4-L5 ON LEFT FOR SPINAL STENOSIS;  Surgeon: [name], MD;  Location: [facility];  Service: Orthopedics;  Laterality: Left;   PAROTIDECTOMY Left 01/24/2018   Procedure: INCISIONAL BIOPSY OF LEFT PAROTID    MASS;  Surgeon: Helayne Seminole, MD;  Location: [facility];  Service: ENT;  Laterality: Left;   SPINE SURGERY     TONSILLECTOMY     VASECTOMY     WRIST GANGLION EXCISION Left     SOCIAL HISTORY: Social History   Socioeconomic History   Marital status: Married    Spouse name: Not on file  Number of children: 2   Years of education: Not on file   Highest education level: Not on file  Occupational History   Occupation: bus driver    Employer: RETIRED  Tobacco Use   Smoking status: Former    Packs/day: 0.50    Years: 14.00    Pack years: 7.00    Types: Cigarettes    Quit date: 12/14/1972    Years since quitting: 48.8   Smokeless tobacco: Never  Vaping Use   Vaping Use: Never used  Substance and Sexual Activity   Alcohol use: Yes    Comment: occasionally   Drug use: No   Sexual activity: Yes    Birth control/protection: None  Other Topics Concern   Not on file  Social History Narrative   Lives with wife   Social Determinants of Health   Financial Resource Strain: Not on file  Food Insecurity: Not on file  Transportation Needs: Not on file  Physical Activity: Not on file  Stress: Not on file  Social Connections: Not on file  Intimate Partner Violence: Not on file    FAMILY HISTORY: Family History  Problem Relation Age of Onset    Colon cancer Maternal Grandfather    Kidney disease Brother    Hypertension Brother    Diabetes Brother    Stroke Maternal Grandmother    Diabetes Mother    Hypertension Mother    CAD Father        died of MI at age 37   Hypertension Father    Diabetes Paternal Aunt        x 4 aunts    ALLERGIES:  is allergic to adalimumab, ampicillin, terazosin, lisinopril, and other.  MEDICATIONS:  No current facility-administered medications for this visit.   No current outpatient medications on file.   Facility-Administered Medications Ordered in Other Visits  Medication Dose Route Frequency Provider Last Rate Last Admin   0.9 %  sodium chloride infusion  250 mL Intravenous Continuous Kary Kos, MD 1 mL/hr at 10/03/21 0533 250 mL at 10/03/21 0533   acetaminophen (TYLENOL) tablet 650 mg  650 mg Oral Q4H PRN Kary Kos, MD       Or   acetaminophen (TYLENOL) suppository 650 mg  650 mg Rectal Q4H PRN Kary Kos, MD       alum & mag hydroxide-simeth (MAALOX/MYLANTA) 200-200-20 MG/5ML suspension 30 mL  30 mL Oral Q6H PRN Kary Kos, MD       ceFAZolin (ANCEF) IVPB 2g/100 mL premix  2 g Intravenous Q8H Kary Kos, MD 200 mL/hr at 10/04/21 0515 2 g at 10/04/21 0515   cyclobenzaprine (FLEXERIL) tablet 10 mg  10 mg Oral TID PRN Kary Kos, MD   10 mg at 10/03/21 0904   gabapentin (NEURONTIN) tablet 600 mg  600 mg Oral BID Kary Kos, MD   600 mg at 10/04/21 0905   hydrALAZINE (APRESOLINE) injection 10 mg  10 mg Intravenous Q4H PRN Eleonore Chiquito, NP   10 mg at 10/03/21 0001   hydrochlorothiazide (HYDRODIURIL) tablet 25 mg  25 mg Oral Daily Kary Kos, MD   25 mg at 10/04/21 1740   HYDROmorphone (DILAUDID) injection 0.5 mg  0.5 mg Intravenous Q2H PRN Kary Kos, MD   0.5 mg at 10/03/21 0645   menthol-cetylpyridinium (CEPACOL) lozenge 3 mg  1 lozenge Oral PRN Kary Kos, MD       Or   phenol (CHLORASEPTIC) mouth spray 1 spray  1 spray Mouth/Throat PRN Kary Kos, MD  metoprolol  succinate (TOPROL-XL) 24 hr tablet 100 mg  100 mg Oral Daily Kary Kos, MD   100 mg at 10/04/21 0905   nitroGLYCERIN (NITROSTAT) SL tablet 0.4 mg  0.4 mg Sublingual Q5 min PRN Kary Kos, MD       ondansetron [facility]) tablet 4 mg  4 mg Oral Q6H PRN Kary Kos, MD       Or   ondansetron [facility]) injection 4 mg  4 mg Intravenous Q6H PRN Kary Kos, MD   4 mg at 10/03/21 0530   oxyCODONE (Oxy IR/ROXICODONE) immediate release tablet 10 mg  10 mg Oral Q3H PRN Kary Kos, MD   10 mg at 10/04/21 1035   pantoprazole (PROTONIX) EC tablet 40 mg  40 mg Oral QHS Kary Kos, MD   40 mg at 10/03/21 2153   primidone (MYSOLINE) tablet 150 mg  150 mg Oral QHS Kary Kos, MD   150 mg at 10/03/21 2153   rosuvastatin (CRESTOR) tablet 20 mg  20 mg Oral Daily Kary Kos, MD   20 mg at 10/04/21 0905   sodium chloride flush (NS) 0.9 % injection 3 mL  3 mL Intravenous Q12H Kary Kos, MD   3 mL at 10/04/21 0913   sodium chloride flush (NS) 0.9 % injection 3 mL  3 mL Intravenous PRN Kary Kos, MD       tamsulosin [facility]) capsule 0.4 mg  0.4 mg Oral QHS Kary Kos, MD   0.4 mg at 10/03/21 2153   traMADol-acetaminophen (ULTRACET) 37.5-325 MG per tablet 1 tablet  1 tablet Oral Q6H PRN Kary Kos, MD        PHYSICAL EXAMINATION: ECOG FS:1 - Symptomatic but completely ambulatory  Vitals:   09/26/21 1553  BP: (!) 161/76  Pulse: 75  Resp: 18  Temp: 97.9 F (36.6 C)  SpO2: 95%   Wt Readings from Last 3 Encounters:  10/02/21 224 lb (101.6 kg)  09/29/21 226 lb (102.5 kg)  09/27/21 (P) 227 lb 6.4 oz (103.1 kg)   Body mass index is 36.59 kg/m.    [name] GENERAL:alert, in no acute distress and comfortable SKIN: no acute rashes, no significant lesions EYES: conjunctiva are pink and non-injected, sclera anicteric OROPHARYNX: MMM, no exudates, no oropharyngeal erythema or ulceration NECK: supple, no JVD LYMPH:  no palpable lymphadenopathy in the cervical, axillary or inguinal regions LUNGS: clear to auscultation b/l  with normal respiratory effort HEART: regular rate & rhythm ABDOMEN:  normoactive bowel sounds , non tender, not distended. Extremity: no pedal edema PSYCH: alert & oriented x 3 with fluent speech NEURO: no focal motor/sensory deficits   LABS CBC Latest Ref Rng & Units 09/27/2021 09/26/2021  WBC 4.0 - 10.5 K/uL 4.5 4.4  Hemoglobin 13.0 - 17.0 g/dL 12.2(L) 12.2(L)  Hematocrit 39.0 - 52.0 % 37.5(L) 36.7(L)  Platelets 150 - 400 K/uL 242 234   CBC    Component Value Date/Time   WBC 10.3 10/03/2021 1057   RBC 2.78 (L) 10/03/2021 1057   HGB 8.8 (L) 10/03/2021 1057   HGB 12.2 (L) 09/26/2021 1443   HCT 27.0 (L) 10/03/2021 1057   HCT 42.7 12/19/2017 1416   PLT 187 10/03/2021 1057   PLT 234 09/26/2021 1443   MCV 97.1 10/03/2021 1057   MCV 94.0 05/09/2018 1805   MCH 31.7 10/03/2021 1057   MCHC 32.6 10/03/2021 1057   RDW 13.8 10/03/2021 1057   LYMPHSABS 0.9 10/03/2021 1057   MONOABS 1.2 (H) 10/03/2021 1057   EOSABS 0.0 10/03/2021 1057  BASOSABS 0.0 10/03/2021 1057    CMP Latest Ref Rng & Units 09/27/2021 09/26/2021  Glucose 70 - 99 mg/dL 76 97  BUN 8 - 23 mg/dL 21 22  Creatinine 0.61 - 1.24 mg/dL 1.35(H) 1.38(H)  Sodium 135 - 145 mmol/L 137 140  Potassium 3.5 - 5.1 mmol/L 4.4 3.7  Chloride 98 - 111 mmol/L 99 103  CO2 22 - 32 mmol/L 30 27  Calcium 8.9 - 10.3 mg/dL 8.3(L) 8.5(L)  Total Protein 6.5 - 8.1 g/dL 6.7 7.5  Total Bilirubin 0.3 - 1.2 mg/dL 0.9 <0.2(L)  Alkaline Phos 38 - 126 U/L 164(H) 184(H)  AST 15 - 41 U/L 25 19  ALT 0 - 44 U/L 19 16    LABORATORY DATA:       01/27/18 Flow Cytometry:   01/27/18 Parotid Gland Surgical Pathology   RADIOGRAPHIC STUDIES: I have personally reviewed the radiological images as listed and agreed with the findings in the report.  ASSESSMENT & PLAN:  Bryan [name] is a 75 y.o. male with    1.history of stage IIE Marginal-zone lymphoma involving the left parotid gland, left cervical LN and mediastinal lymph nodes. Currently  in remission Hep C neg and Hep B -He completed 4 dose Rituxan 02/07/18-02/28/18.  LDH has normalized and the Parotid mass is no longer palpable. 04/22/18 PET/CT revealed complete metabolic response to therapy. No residual hypermetabolic mass or lymphadenopathy.  He has continued to be on Rituxan every 13 weeks by his rheumatologist for management of his rheumatoid arthritis but this is probably also serving as maintenance treatment for his marginal zone lymphoma.  2. Jehovas Witness- declines any use of blood products.  3. Rheumatoid Arthritis  Rituxan seems to have helped his RA as well. - off  MTX and prednisone and is currently on plaquenil per his rheumatologist, Dr. Amil Amen and PA Leafy Kindle.  -I encouraged him to be more active with at least walking and possibly start PT or [employer]ne[employer] program.  4. Low B12 levels B12 level still low at 209 -Increase to B12 SL 2070mcg SL daily  5.  Mild iron deficiency Lab Results  Component Value Date   FERRITIN 38 09/26/2021  Plan -Would recommend patient take iron polysaccharide 150 mg p.o. daily and recheck levels with his primary care physician in 3 months. -With his rheumatoid arthritis and chronic kidney disease would recommend targeting a ferritin of closer to 100.  PLAN: - Lab results today (09/26/2021) of CBC w/diff hemoglobin at 12.2, WBC count of 4.4k and platelets of 234k  CMP stable with creatinine of 1.38. Ferritin 38 B12 209. -Would recommend taking iron polysaccharide 150 mg p.o. daily and if tolerated twice daily to try to target a ferritin level of 100 in the context of chronic inflammation from rheumatoid arthritis and his chronic kidney disease. -B12 level still low resuming he is taking his 1000 mcg daily will increase the dose to 2000 mcg daily. -He will need to recheck his blood counts iron labs and B12 levels in about 3 months with his primary care physician and make dose adjustments accordingly. -If his B12  levels are still low despite taking 2000 mcg sublingual daily he will need to be switched to subcutaneous B12 shots which she can follow-up with his primary doctor. -No lab or clinical evidence of progression of his marginal zone lymphoma at this time. -Continue f/u with Rheumatology for continued Rituxan for RA which will likely also benefit lymphoma control. -Will see back in 12 months with  labs    FOLLOW UP: RTC with Dr Irene Limbo with labs in 12 months   . The total time spent in the appointment was 25 minutes and more than 50% was on counseling and direct patient cares.   All of the patient's questions were answered with apparent satisfaction. The patient knows to call the clinic with any problems, questions or concerns.   Sullivan Lone MD [place] AAHIVMS [facility] Hematology/Oncology Physician [facility]

## 2021-10-04 NOTE — Progress Notes (Signed)
Subjective: Patient reports some mild back pain  Objective: Vital signs in last 24 hours: Temp:  [98.5 F (36.9 C)-99.9 F (37.7 C)] 99.9 F (37.7 C) (11/23 1558) Pulse Rate:  [80-94] 80 (11/23 1558) Resp:  [15-20] 17 (11/23 1558) BP: (105-139)/(50-59) 139/57 (11/23 1558) SpO2:  [93 %-98 %] 98 % (11/23 1558)  Intake/Output from previous day: 11/22 0701 - 11/23 0700 In: 1060 [P.O.:960; IV Piggyback:100] Out: 1010 [Urine:700; Drains:310] Intake/Output this shift: Total I/O In: 350 [P.O.:350] Out: 1300 [Urine:1300]  Neurologic: Grossly normal  Lab Results: Lab Results  Component Value Date   WBC 10.3 10/03/2021   HGB 8.8 (L) 10/03/2021   HCT 27.0 (L) 10/03/2021   MCV 97.1 10/03/2021   PLT 187 10/03/2021   Lab Results  Component Value Date   INR 1.0 02/22/2020   BMET Lab Results  Component Value Date   NA 135 10/03/2021   K 4.0 10/03/2021   CL 99 10/03/2021   CO2 27 10/03/2021   GLUCOSE 139 (H) 10/03/2021   BUN 36 (H) 10/03/2021   CREATININE 2.72 (H) 10/03/2021   CALCIUM 7.6 (L) 10/03/2021    Studies/Results: No results found.  Assessment/Plan: Postop day 2 plif L2-3, continue therapies and pain medications. Likely discharge home tomorrow    LOS: 2 days    Ocie Cornfield [facility] 10/04/2021, 5:27 PM

## 2021-10-04 NOTE — Progress Notes (Signed)
Patient refused CPAP for the night  

## 2021-10-04 NOTE — Progress Notes (Signed)
OT Cancellation Note  Patient Details Name: Bryan [name] MRN: [id number] DOB: 1946/01/15   Cancelled Treatment:    Reason Eval/Treat Not Completed: Patient's level of consciousness (too lethargic) Reports No pain and unable to sustain attention to finish his statement. Pt falling asleep mid sentence.   Billey Chang, OTR/L  Acute Rehabilitation Services Pager: [phone] Office: [phone] .  10/04/2021, 2:42 PM

## 2021-10-04 NOTE — Addendum Note (Signed)
Addended by: Sullivan Lone on: 10/04/2021 12:19 PM   Modules accepted: Orders

## 2021-10-05 LAB — URINALYSIS, ROUTINE W REFLEX MICROSCOPIC
Bilirubin Urine: NEGATIVE
Glucose, UA: NEGATIVE mg/dL
Ketones, ur: NEGATIVE mg/dL
Nitrite: NEGATIVE
Protein, ur: NEGATIVE mg/dL
Specific Gravity, Urine: 1.011 (ref 1.005–1.030)
pH: 5 (ref 5.0–8.0)

## 2021-10-05 LAB — CBC
HCT: 22.8 % — ABNORMAL LOW (ref 39.0–52.0)
Hemoglobin: 7.3 g/dL — ABNORMAL LOW (ref 13.0–17.0)
MCH: 31.3 pg (ref 26.0–34.0)
MCHC: 32 g/dL (ref 30.0–36.0)
MCV: 97.9 fL (ref 80.0–100.0)
Platelets: 202 10*3/uL (ref 150–400)
RBC: 2.33 MIL/uL — ABNORMAL LOW (ref 4.22–5.81)
RDW: 13.6 % (ref 11.5–15.5)
WBC: 11.1 10*3/uL — ABNORMAL HIGH (ref 4.0–10.5)
nRBC: 0 % (ref 0.0–0.2)

## 2021-10-05 LAB — LACTIC ACID, PLASMA
Lactic Acid, Venous: 1.6 mmol/L (ref 0.5–1.9)
Lactic Acid, Venous: 1.2 mmol/L (ref 0.5–1.9)

## 2021-10-05 MED ORDER — CHLORHEXIDINE GLUCONATE CLOTH 2 % EX PADS
6.0000 | MEDICATED_PAD | Freq: Every day | CUTANEOUS | Status: DC
  Administered 2021-10-05 – 2021-10-20 (×16): 6 via TOPICAL

## 2021-10-05 NOTE — Progress Notes (Signed)
Patient has had his hemovac removed per orders today. Later began complaining of back pain being a 12/10 at the incision site and developing a temp at 100.2 at around 1437. This nurse intervened by placed ice at hotspots, removing blankets and giving tylenol. This nurse called IV team to insert a new line and the IV team nurse also drew this patients lactic acid and CBC. MD was called and this nurse left a message with the Neurosurgery secretary. This nurse [place] patient status.   Shelbie Proctor, RN

## 2021-10-05 NOTE — Progress Notes (Signed)
Physical Therapy Treatment Patient Details Name: Bryan [name] MRN: [id number] DOB: 13-Dec-1945 Today's Date: 10/05/2021   History of Present Illness 75 yo male s/p L2-4 decompression and fusion, exploration of L4-S1 fusion, redo L5-S1 fusion on 11/22. PMH includes OA, BPH, cancer, DM, obesity, RA, seizures, ACDF 2014, lap chole, lumbar surgeries 2016-2017.    PT Comments    Pt with limited progression of gait this session struggling with scooting and anterior translation in sitting requiring mod assist for reciprocal scooting and trunk movement. Pt continues to require assist for all transitions and gait with pt not yet able to tolerate gait for stair trial. Pt encouraged to be OOB for meals, educated for brace wear and precautions.     Recommendations for follow up therapy are one component of a multi-disciplinary discharge planning process, led by the attending physician.  Recommendations may be updated based on patient status, additional functional criteria and insurance authorization.  Follow Up Recommendations  Home health PT     Assistance Recommended at Discharge Intermittent Supervision/Assistance  Equipment Recommendations  Rolling walker (2 wheels);BSC/3in1    Recommendations for Other Services       Precautions / Restrictions Precautions Precautions: Fall;Back Precaution Booklet Issued: Yes (comment) Precaution Comments: recalled 3/3 precautions following cue for BLT Required Braces or Orthoses: Spinal Brace Spinal Brace: Lumbar corset;Applied in sitting position Restrictions Weight Bearing Restrictions: No     Mobility  Bed Mobility Overal bed mobility: Needs Assistance Bed Mobility: Sit to Sidelying Rolling: Min assist Sidelying to sit: Min assist     Sit to sidelying: Min assist General bed mobility comments: assist to lift legs to surface and roll to reposition    Transfers Overall transfer level: Needs assistance Equipment used: Rolling walker (2  wheels) Transfers: Sit to/from Stand Sit to Stand: Min assist           General transfer comment: cues for hand placement with assist for anterior translation and rise    Ambulation/Gait Ambulation/Gait assistance: Min guard Gait Distance (Feet): 140 Feet Assistive device: Rolling walker (2 wheels) Gait Pattern/deviations: Step-through pattern;Decreased stride length;Trunk flexed   Gait velocity interpretation: 1.31 - 2.62 ft/sec, indicative of limited community ambulator   General Gait Details: cues for posture and proximity to RW with decreased stride RLE   Stairs             Wheelchair Mobility    Modified Rankin (Stroke Patients Only)       Balance Overall balance assessment: Needs assistance;History of Falls Sitting-balance support: Bilateral upper extremity supported;Feet supported Sitting balance-Leahy Scale: Fair Sitting balance - Comments: able to static sit on EOB   Standing balance support: Bilateral upper extremity supported;Reliant on assistive device for balance Standing balance-Leahy Scale: Poor Standing balance comment: reliant on RW for steadying                            Cognition Arousal/Alertness: Awake/alert Behavior During Therapy: Flat affect Overall Cognitive Status: Impaired/Different from baseline Area of Impairment: Safety/judgement;Memory                     Memory: Decreased recall of precautions   Safety/Judgement: Decreased awareness of deficits     General Comments: able to recall precautions with cue for BLT and asked later during visit and was able to recall        Exercises General Exercises - Lower Extremity Heel Slides: AROM;Both;Supine;10 reps Hip ABduction/ADduction: AROM;Both;Supine;10 reps  General Comments        Pertinent Vitals/Pain Pain Assessment: Faces Pain Score: 6  Faces Pain Scale: Hurts even more Pain Location: back Pain Descriptors / Indicators:  Discomfort;Grimacing;Sore Pain Intervention(s): Limited activity within patient's tolerance;Monitored during session;Repositioned;Patient requesting pain meds-RN notified    Home Living                          Prior Function            PT Goals (current goals can now be found in the care plan section) Progress towards PT goals: Progressing toward goals    Frequency    Min 5X/week      PT Plan Current plan remains appropriate    Co-evaluation              AM-PAC PT "6 Clicks" Mobility   Outcome Measure  Help needed turning from your back to your side while in a flat bed without using bedrails?: A Little Help needed moving from lying on your back to sitting on the side of a flat bed without using bedrails?: A Lot Help needed moving to and from a bed to a chair (including a wheelchair)?: A Little Help needed standing up from a chair using your arms (e.g., wheelchair or bedside chair)?: A Little Help needed to walk in hospital room?: A Little Help needed climbing 3-5 steps with a railing? : A Little 6 Click Score: 17    End of Session Equipment Utilized During Treatment: Back brace Activity Tolerance: Patient tolerated treatment well Patient left: in bed;with call bell/phone within reach;with bed alarm set Nurse Communication: Mobility status PT Visit Diagnosis: Other abnormalities of gait and mobility (R26.89);Muscle weakness (generalized) (M62.81)     Time: 2423-5361 PT Time Calculation (min) (ACUTE ONLY): 22 min  Charges:  $Gait Training: 8-22 mins                     Bayard Males, PT Acute Rehabilitation Services Pager: [phone] Office: [place] 10/05/2021, 9:39 AM

## 2021-10-05 NOTE — Progress Notes (Signed)
Occupational Therapy Treatment Patient Details Name: Bryan [name] MRN: [id number] DOB: 05/09/1946 Today's Date: 10/05/2021   History of present illness 75 yo male s/p L2-4 decompression and fusion, exploration of L4-S1 fusion, redo L5-S1 fusion on 11/22. PMH includes OA, BPH, cancer, DM, obesity, RA, seizures, ACDF 2014, lap chole, lumbar surgeries 2016-2017.   OT comments  Patient received in bed and agreeable to OT treatment. Patient asked about back precautions and had difficulty recalling. Patient was told BLT and was able to recall all precautions. Patient required verbal cues for log rolling technique and min assist to get to EOB.  Patient required assistance to donn back brace and increased time before attempting transfer to recliner. Patient performed grooming with setup seated in recliner. Patient instructed on use of reacher and sock aide for LB dressing and demonstration provided. Patient was able to return demonstration for doffing and donning socks and simulated donning pants with good understanding with verbal and min assist. Patient making good progress with OT treatment. Acute OT to continue to follow.    Recommendations for follow up therapy are one component of a multi-disciplinary discharge planning process, led by the attending physician.  Recommendations may be updated based on patient status, additional functional criteria and insurance authorization.    Follow Up Recommendations  Home health OT    Assistance Recommended at Discharge Intermittent Supervision/Assistance  Equipment Recommendations  BSC/3in1;Other (comment)    Recommendations for Other Services      Precautions / Restrictions Precautions Precautions: Fall;Back Precaution Booklet Issued: Yes (comment) Precaution Comments: recalled 3/3 precautions following cue for BLT Required Braces or Orthoses: Spinal Brace Spinal Brace: Lumbar corset;Applied in sitting position Restrictions Weight Bearing  Restrictions: No       Mobility Bed Mobility Overal bed mobility: Needs Assistance Bed Mobility: Rolling;Sidelying to Sit Rolling: Min assist Sidelying to sit: Min assist       General bed mobility comments: assist for log roll and trunk elevation to upright sitting, mod cuing for sequencing.    Transfers Overall transfer level: Needs assistance Equipment used: Rolling walker (2 wheels) Transfers: Sit to/from Stand Sit to Stand: Min assist           General transfer comment: transferred to recliner with RW and verbal cues for safety and to push up to stand and reach back to sit     Balance Overall balance assessment: Needs assistance;History of Falls Sitting-balance support: Bilateral upper extremity supported;Feet supported Sitting balance-Leahy Scale: Fair Sitting balance - Comments: able to static sit on EOB   Standing balance support: Bilateral upper extremity supported;Reliant on assistive device for balance Standing balance-Leahy Scale: Poor Standing balance comment: reliant on RW for steadying                           ADL either performed or assessed with clinical judgement   ADL Overall ADL's : Needs assistance/impaired     Grooming: Wash/dry face;Oral care;Set up;Sitting Grooming Details (indicate cue type and reason): performed seated in recliner             Lower Body Dressing: Moderate assistance;With adaptive equipment Lower Body Dressing Details (indicate cue type and reason): AE education with reacher and sock aide with verbal cues to perform currectly and min assist to use.  Mod assist to pull up any clothing             Functional mobility during ADLs: Minimal assistance;Rolling walker (2 wheels) General ADL Comments:  AE training performed with verbal cues and min assist    Extremity/Trunk Assessment              Vision       Perception     Praxis      Cognition Arousal/Alertness: Awake/alert Behavior During  Therapy: WFL for tasks assessed/performed Overall Cognitive Status: Within Functional Limits for tasks assessed Area of Impairment: Safety/judgement;Memory                     Memory: Decreased recall of precautions   Safety/Judgement: Decreased awareness of deficits     General Comments: able to recall precautions with cue for BLT and asked later during visit and was able to recall          Exercises     Shoulder Instructions       General Comments      Pertinent Vitals/ Pain       Pain Assessment: Faces Faces Pain Scale: Hurts even more Pain Location: back Pain Descriptors / Indicators: Discomfort;Grimacing;Sore Pain Intervention(s): Monitored during session;Repositioned;Patient requesting pain meds-RN notified  Home Living                                          Prior Functioning/Environment              Frequency  Min 2X/week        Progress Toward Goals  OT Goals(current goals can now be found in the care plan section)  Progress towards OT goals: Progressing toward goals  Acute Rehab OT Goals Patient Stated Goal: go home with wife Time For Goal Achievement: 10/17/21 Potential to Achieve Goals: Good ADL Goals Pt Will Perform Grooming: with min guard assist;sitting Pt Will Perform Lower Body Dressing: with supervision;with adaptive equipment;sit to/from stand Pt Will Transfer to Toilet: with min guard assist;bedside commode;ambulating Additional ADL Goal #1: pt will complete bed mobility supervision level for adls.  Plan Discharge plan remains appropriate    Co-evaluation                 AM-PAC OT "6 Clicks" Daily Activity     Outcome Measure   Help from another person eating meals?: A Little Help from another person taking care of personal grooming?: A Little Help from another person toileting, which includes using toliet, bedpan, or urinal?: A Little Help from another person bathing (including washing,  rinsing, drying)?: A Little Help from another person to put on and taking off regular upper body clothing?: A Little Help from another person to put on and taking off regular lower body clothing?: A Little 6 Click Score: 18    End of Session Equipment Utilized During Treatment: Rolling walker (2 wheels);Back brace;Other (comment) (adaptive equipment)  OT Visit Diagnosis: Unsteadiness on feet (R26.81);Muscle weakness (generalized) (M62.81);Pain   Activity Tolerance Patient tolerated treatment well;Patient limited by pain   Patient Left in chair;with call bell/phone within reach;with chair alarm set   Nurse Communication Mobility status;Patient requests pain meds        Time: 0721-0806 OT Time Calculation (min): 45 min  Charges: OT General Charges $OT Visit: 1 Visit OT Treatments $Self Care/Home Management : 38-52 mins  Lodema Hong, [place]  Pager [phone] Office [place] 10/05/2021, 8:17 AM

## 2021-10-05 NOTE — Progress Notes (Signed)
Patient refuses CPAP at this time.

## 2021-10-05 NOTE — Progress Notes (Signed)
Overall stable.  Mobility still marginal for discharge home.  Patient still trying to make arrangements with regard to social situation for discharge home.  Pain reasonably well controlled.  No new issues or problems.  He is afebrile.  His vital signs are stable.  He is awake and alert.  He is oriented and appropriate.  Motor and sensory function stable.  Wounds clean and dry.  Chest and abdomen benign.  Progressing reasonably well following lumbar decompression and fusion surgery.  Continue efforts at mobilization and continue working with regard to discharge plan.

## 2021-10-06 NOTE — Progress Notes (Signed)
Patient has been drinking fluids since his catheter was removed at 1400. Due to void but has not voided in the past 4 hours. Bladder scanned 212 ml of urine on the bladder. Patient sat on toilet for approx. 45 minutes no urge to urinate or defalcate. Patient was moved back into bed where he was in & out catheterized with an output of 300 mL. WCTM.  Shelbie Proctor, RN

## 2021-10-06 NOTE — Progress Notes (Signed)
Physical Therapy Treatment Patient Details Name: Bryan [name] MRN: [id number] DOB: 1946/02/13 Today's Date: 10/06/2021   History of Present Illness 75 yo male s/p L2-4 decompression and fusion, exploration of L4-S1 fusion, redo L5-S1 fusion on 11/22. PMH includes OA, BPH, cancer, DM, obesity, RA, seizures, ACDF 2014, lap chole, lumbar surgeries 2016-2017.    PT Comments    Patient limited by bilateral LE weakness and fatigue. Patient ambulated x30' and x12' with Rw and minA + chair follow. Seated rest break between each bout. Patient required cues to recall back precautions. Educated patient on importance of precautions and brace wear, patient verbalized understanding but does not demonstrate during mobility. Updated discharge recommendation to SNF at discharge to maximize functional independence and safety.    Recommendations for follow up therapy are one component of a multi-disciplinary discharge planning process, led by the attending physician.  Recommendations may be updated based on patient status, additional functional criteria and insurance authorization.  Follow Up Recommendations  Skilled nursing-short term rehab (<3 hours/day)     Assistance Recommended at Discharge Frequent or constant Supervision/Assistance  Equipment Recommendations  Rolling Daiwik [place] (2 wheels);BSC/3in1    Recommendations for Other Services       Precautions / Restrictions Precautions Precautions: Fall;Back Precaution Booklet Issued: Yes (comment) Precaution Comments: recalled 3/3 precautions following cue for BLT Required Braces or Orthoses: Spinal Brace Spinal Brace: Lumbar corset;Applied in sitting position Restrictions Weight Bearing Restrictions: No     Mobility  Bed Mobility Overal bed mobility: Needs Assistance Bed Mobility: Rolling;Sidelying to Sit Rolling: Min assist Sidelying to sit: Mod assist       General bed mobility comments: assist to initiate rolling towards L and trunk  elevation    Transfers Overall transfer level: Needs assistance Equipment used: Rolling Annie Roseboom (2 wheels) Transfers: Sit to/from Stand Sit to Stand: Mod assist           General transfer comment: modA to rise and steady. Cues for hand placement    Ambulation/Gait Ambulation/Gait assistance: Min assist Gait Distance (Feet): 30 Feet (x12) Assistive device: Rolling Atom Solivan (2 wheels) Gait Pattern/deviations: Step-through pattern;Decreased stride length;Trunk flexed Gait velocity: decreased     General Gait Details: cues required for posture and close RW proximity. MinA for balance. Patient becoming more unsteady with fatigue. Close chair follow for safety   Stairs             Wheelchair Mobility    Modified Rankin (Stroke Patients Only)       Balance Overall balance assessment: Needs assistance;History of Falls Sitting-balance support: Bilateral upper extremity supported;Feet supported Sitting balance-Leahy Scale: Fair     Standing balance support: Bilateral upper extremity supported;Reliant on assistive device for balance Standing balance-Leahy Scale: Poor Standing balance comment: reliant on RW for steadying                            Cognition Arousal/Alertness: Awake/alert Behavior During Therapy: Flat affect Overall Cognitive Status: Impaired/Different from baseline Area of Impairment: Safety/judgement;Memory                     Memory: Decreased recall of precautions   Safety/Judgement: Decreased awareness of deficits     General Comments: able to recall precautions with cue for BLT and asked later during visit and was able to recall        Exercises      General Comments        Pertinent Vitals/Pain  Pain Assessment: Faces Faces Pain Scale: Hurts even more Pain Location: back Pain Descriptors / Indicators: Discomfort;Grimacing;Sore Pain Intervention(s): Monitored during session;Repositioned    Home Living                           Prior Function            PT Goals (current goals can now be found in the care plan section) Acute Rehab PT Goals Patient Stated Goal: to get stronger PT Goal Formulation: With patient Time For Goal Achievement: 10/17/21 Potential to Achieve Goals: Good Progress towards PT goals: Progressing toward goals    Frequency    Min 5X/week      PT Plan Discharge plan needs to be updated    Co-evaluation              AM-PAC PT "6 Clicks" Mobility   Outcome Measure  Help needed turning from your back to your side while in a flat bed without using bedrails?: A Little Help needed moving from lying on your back to sitting on the side of a flat bed without using bedrails?: A Lot Help needed moving to and from a bed to a chair (including a wheelchair)?: A Lot Help needed standing up from a chair using your arms (e.g., wheelchair or bedside chair)?: A Lot Help needed to walk in hospital room?: A Lot Help needed climbing 3-5 steps with a railing? : Total 6 Click Score: 12    End of Session Equipment Utilized During Treatment: Gait belt;Back brace Activity Tolerance: Patient tolerated treatment well Patient left: in chair;with call bell/phone within reach;with chair alarm set Nurse Communication: Mobility status PT Visit Diagnosis: Other abnormalities of gait and mobility (R26.89);Muscle weakness (generalized) (M62.81)     Time: 6720-9470 PT Time Calculation (min) (ACUTE ONLY): 25 min  Charges:  $Gait Training: 23-37 mins                     Aalaya Yadao A. Gilford Rile PT, DPT Acute Rehabilitation Services Pager [phone] Office [phone]    Linna Hoff 10/06/2021, 5:23 PM

## 2021-10-06 NOTE — Progress Notes (Signed)
Patient doing reasonably well today.  Back pain reasonably well controlled.  Mobility still marginal for discharge home.    Patient did spike a significant fever to 103 yesterday.  Temperature now back in the normal range.  Patient awake and alert.  He is oriented and appropriate.  His neck is supple.  His wound looks good.  Chest and abdomen benign.     Labs with minimal elevation of his white blood cell count.  Urinalysis negative.    Continue efforts immobilization and observation.  No current evidence of sepsis or wound infection at present.  Continue to monitor.

## 2021-10-07 ENCOUNTER — Inpatient Hospital Stay (HOSPITAL_COMMUNITY): Payer: No Typology Code available for payment source

## 2021-10-07 NOTE — Progress Notes (Signed)
Multiple times tried by pt to void but he was unable. Foley placed per K Meyran, NP's order.

## 2021-10-07 NOTE — Progress Notes (Signed)
NEUROSURGERY PROGRESS NOTE Doing well from a pain standpoint. He is postop day 5 lumbar fusion. He developed a fever of 103 yesterday. Labwork looks ok. Will order chest xray today. Continue to mobilize. Having urinary retention issues today. He has been in and out cath twice. Will place foley if he cannot urinate after next I&O.   Temp:  [98.3 F (36.8 C)-100.8 F (38.2 C)] 98.7 F (37.1 C) (11/26 0826) Pulse Rate:  [87-104] 97 (11/26 0315) Resp:  [15-18] 16 (11/26 0315) BP: (112-131)/(54-91) 123/91 (11/26 0826) SpO2:  [96 %-100 %] 98 % (11/26 0826)    Eleonore Chiquito, NP 10/07/2021 10:23 AM

## 2021-10-08 NOTE — Progress Notes (Signed)
NEUROSURGERY PROGRESS NOTE  Doing well. Sitting up in chair more alert and not as confused today. Foley was placed for urinary retention. Ordered urine culture. Continue to ambulate. Pending SNF placement  Temp:  [97.7 F (36.5 C)-101.1 F (38.4 C)] 100.4 F (38 C) (11/27 1104) Pulse Rate:  [99-104] 101 (11/27 1104) Resp:  [17-20] 20 (11/27 1104) BP: (109-136)/(59-73) 109/60 (11/27 1104) SpO2:  [97 %-100 %] 97 % (11/27 1104)   Eleonore Chiquito, NP 10/08/2021 12:42 PM

## 2021-10-08 NOTE — Progress Notes (Signed)
Patient continues to have issues with confusion Patient was not confused at all prior to hemovac removal.WCTM.   Shelbie Proctor, RN

## 2021-10-09 ENCOUNTER — Inpatient Hospital Stay (HOSPITAL_COMMUNITY): Payer: No Typology Code available for payment source

## 2021-10-09 DIAGNOSIS — R6520 Severe sepsis without septic shock: Secondary | ICD-10-CM

## 2021-10-09 DIAGNOSIS — I4891 Unspecified atrial fibrillation: Secondary | ICD-10-CM

## 2021-10-09 DIAGNOSIS — N401 Enlarged prostate with lower urinary tract symptoms: Secondary | ICD-10-CM

## 2021-10-09 DIAGNOSIS — I10 Essential (primary) hypertension: Secondary | ICD-10-CM

## 2021-10-09 DIAGNOSIS — C8580 Other specified types of non-Hodgkin lymphoma, unspecified site: Secondary | ICD-10-CM

## 2021-10-09 DIAGNOSIS — N138 Other obstructive and reflux uropathy: Secondary | ICD-10-CM

## 2021-10-09 DIAGNOSIS — A419 Sepsis, unspecified organism: Secondary | ICD-10-CM

## 2021-10-09 DIAGNOSIS — M48061 Spinal stenosis, lumbar region without neurogenic claudication: Principal | ICD-10-CM

## 2021-10-09 DIAGNOSIS — D62 Acute posthemorrhagic anemia: Secondary | ICD-10-CM

## 2021-10-09 DIAGNOSIS — Z531 Procedure and treatment not carried out because of patient's decision for reasons of belief and group pressure: Secondary | ICD-10-CM

## 2021-10-09 LAB — CBC WITH DIFFERENTIAL/PLATELET
Abs Immature Granulocytes: 0.03 10*3/uL (ref 0.00–0.07)
Basophils Absolute: 0 10*3/uL (ref 0.0–0.1)
Basophils Relative: 0 %
Eosinophils Absolute: 0.2 10*3/uL (ref 0.0–0.5)
Eosinophils Relative: 2 %
HCT: 18.2 % — ABNORMAL LOW (ref 39.0–52.0)
Hemoglobin: 6.1 g/dL — CL (ref 13.0–17.0)
Immature Granulocytes: 0 %
Lymphocytes Relative: 12 %
Lymphs Abs: 1 10*3/uL (ref 0.7–4.0)
MCH: 31.6 pg (ref 26.0–34.0)
MCHC: 33.5 g/dL (ref 30.0–36.0)
MCV: 94.3 fL (ref 80.0–100.0)
Monocytes Absolute: 0.8 10*3/uL (ref 0.1–1.0)
Monocytes Relative: 9 %
Neutro Abs: 6.3 10*3/uL (ref 1.7–7.7)
Neutrophils Relative %: 77 %
Platelets: 262 10*3/uL (ref 150–400)
RBC: 1.93 MIL/uL — ABNORMAL LOW (ref 4.22–5.81)
RDW: 13.3 % (ref 11.5–15.5)
WBC: 8.3 10*3/uL (ref 4.0–10.5)
nRBC: 0.4 % — ABNORMAL HIGH (ref 0.0–0.2)

## 2021-10-09 LAB — COMPREHENSIVE METABOLIC PANEL
ALT: 47 U/L — ABNORMAL HIGH (ref 0–44)
AST: 88 U/L — ABNORMAL HIGH (ref 15–41)
Albumin: 2 g/dL — ABNORMAL LOW (ref 3.5–5.0)
Alkaline Phosphatase: 179 U/L — ABNORMAL HIGH (ref 38–126)
Anion gap: 8 (ref 5–15)
BUN: 47 mg/dL — ABNORMAL HIGH (ref 8–23)
CO2: 30 mmol/L (ref 22–32)
Calcium: 8 mg/dL — ABNORMAL LOW (ref 8.9–10.3)
Chloride: 95 mmol/L — ABNORMAL LOW (ref 98–111)
Creatinine, Ser: 2.31 mg/dL — ABNORMAL HIGH (ref 0.61–1.24)
GFR, Estimated: 29 mL/min — ABNORMAL LOW (ref >60)
Glucose, Bld: 132 mg/dL — ABNORMAL HIGH (ref 70–99)
Potassium: 3.9 mmol/L (ref 3.5–5.1)
Sodium: 133 mmol/L — ABNORMAL LOW (ref 135–145)
Total Bilirubin: 0.6 mg/dL (ref 0.3–1.2)
Total Protein: 6 g/dL — ABNORMAL LOW (ref 6.5–8.1)

## 2021-10-09 LAB — URINALYSIS, ROUTINE W REFLEX MICROSCOPIC
Bilirubin Urine: NEGATIVE
Glucose, UA: NEGATIVE mg/dL
Ketones, ur: NEGATIVE mg/dL
Leukocytes,Ua: NEGATIVE
Nitrite: NEGATIVE
Protein, ur: NEGATIVE mg/dL
Specific Gravity, Urine: 1.011 (ref 1.005–1.030)
pH: 5 (ref 5.0–8.0)

## 2021-10-09 LAB — GLUCOSE, CAPILLARY: Glucose-Capillary: 114 mg/dL — ABNORMAL HIGH (ref 70–99)

## 2021-10-09 LAB — URINE CULTURE: Culture: NO GROWTH

## 2021-10-09 LAB — LACTIC ACID, PLASMA
Lactic Acid, Venous: 1 mmol/L (ref 0.5–1.9)
Lactic Acid, Venous: 0.9 mmol/L (ref 0.5–1.9)

## 2021-10-09 MED ORDER — METOPROLOL TARTRATE 5 MG/5ML IV SOLN
INTRAVENOUS | Status: AC
  Filled 2021-10-09: qty 5

## 2021-10-09 MED ORDER — AMIODARONE LOAD VIA INFUSION
150.0000 mg | Freq: Once | INTRAVENOUS | Status: AC
  Filled 2021-10-09: qty 83.34

## 2021-10-09 MED ORDER — POLYSACCHARIDE IRON COMPLEX 150 MG PO CAPS
150.0000 mg | ORAL_CAPSULE | Freq: Every day | ORAL | Status: DC
  Administered 2021-10-09 – 2021-10-20 (×12): 150 mg via ORAL
  Filled 2021-10-09 (×12): qty 1

## 2021-10-09 MED ORDER — LEVOFLOXACIN 500 MG PO TABS
500.0000 mg | ORAL_TABLET | Freq: Every day | ORAL | Status: AC
  Administered 2021-10-09: 11:00:00 500 mg via ORAL
  Filled 2021-10-09: qty 1

## 2021-10-09 MED ORDER — LEVOFLOXACIN 500 MG PO TABS
250.0000 mg | ORAL_TABLET | Freq: Every day | ORAL | Status: DC
  Administered 2021-10-10 – 2021-10-16 (×7): 250 mg via ORAL
  Filled 2021-10-09 (×7): qty 1

## 2021-10-09 MED ORDER — AMIODARONE HCL IN DEXTROSE 360-4.14 MG/200ML-% IV SOLN
INTRAVENOUS | Status: AC
  Administered 2021-10-09: 02:00:00 60 mg/h
  Filled 2021-10-09: qty 200

## 2021-10-09 MED ORDER — AMIODARONE HCL IN DEXTROSE 360-4.14 MG/200ML-% IV SOLN
30.0000 mg/h | INTRAVENOUS | Status: DC
  Administered 2021-10-09: 08:00:00 30 mg/h via INTRAVENOUS
  Filled 2021-10-09 (×15): qty 200

## 2021-10-09 MED ORDER — SODIUM CHLORIDE 0.9 % IV BOLUS: 500.0000 mL | Freq: Once | INTRAVENOUS | Status: DC | PRN

## 2021-10-09 MED ORDER — SODIUM CHLORIDE 0.9 % IV SOLN: INTRAVENOUS | Status: DC

## 2021-10-09 MED ORDER — METOPROLOL TARTRATE 5 MG/5ML IV SOLN
5.0000 mg | Freq: Once | INTRAVENOUS | Status: AC
  Administered 2021-10-09: 01:00:00 5 mg via INTRAVENOUS

## 2021-10-09 MED ORDER — ATORVASTATIN CALCIUM 10 MG PO TABS
20.0000 mg | ORAL_TABLET | Freq: Every day | ORAL | Status: DC
  Administered 2021-10-10 – 2021-10-20 (×11): 20 mg via ORAL
  Filled 2021-10-09 (×11): qty 2

## 2021-10-09 MED ORDER — AMIODARONE HCL IN DEXTROSE 360-4.14 MG/200ML-% IV SOLN
60.0000 mg/h | INTRAVENOUS | Status: DC
  Administered 2021-10-09 (×2): 60 mg/h via INTRAVENOUS
  Filled 2021-10-09: qty 200

## 2021-10-09 MED ORDER — METOPROLOL TARTRATE 5 MG/5ML IV SOLN
INTRAVENOUS | Status: AC
  Administered 2021-10-09: 01:00:00 5 mg via INTRAVENOUS
  Filled 2021-10-09: qty 5

## 2021-10-09 MED ORDER — LEVOFLOXACIN 500 MG PO TABS: 500.0000 mg | ORAL_TABLET | Freq: Every day | ORAL | Status: DC

## 2021-10-09 NOTE — Progress Notes (Signed)
Occupational Therapy Treatment Patient Details Name: Bryan [name] MRN: [id number] DOB: August 05, 1946 Today's Date: 10/09/2021   History of present illness 75 yo male s/p L2-4 decompression and fusion, exploration of L4-S1 fusion, redo L5-S1 fusion on 11/22. Developed tachycardia up to 150s on 11/27, cardiology following. PMH includes OA, BPH, cancer, DM, obesity, RA, seizures, ACDF 2014, lap chole, lumbar surgeries 2016-2017.   OT comments  Pt progressed to EOB sitting with increased bed mobility. Pt noted to have a full body tremors at times and pt inconsistently reporting if this is baseline. Pt was not noted in previous sessions to have this tremor ( seen x2- PT present also reports new occurrence) Pt noted to have decr SBP with sitting and standing this session > 25 points. Max HR 161. Pt noted to have decreased HR with decreased BP. RN at bed side during standing attempt due to noted trending downward BP. Recommendation for session to try ted hose. Pt now more appropriate for SNF rehab for d/c planning.    Recommendations for follow up therapy are one component of a multi-disciplinary discharge planning process, led by the attending physician.  Recommendations may be updated based on patient status, additional functional criteria and insurance authorization.    Follow Up Recommendations  Skilled nursing-short term rehab (<3 hours/day)    Assistance Recommended at Discharge Intermittent Supervision/Assistance  Equipment Recommendations  BSC/3in1;Other (comment)    Recommendations for Other Services      Precautions / Restrictions Precautions Precautions: Fall;Back Precaution Comments: poor recall of precautions Required Braces or Orthoses: Spinal Brace Spinal Brace: Lumbar corset       Mobility Bed Mobility Overal bed mobility: Needs Assistance Bed Mobility: Rolling;Sidelying to Sit;Sit to Sidelying Rolling: Mod assist Sidelying to sit: Mod assist;+2 for physical assistance    Sit to supine: Mod assist Sit to sidelying: Mod assist;+2 for physical assistance General bed mobility comments: mod +2 for log roll to/from EOB for trunk and LE management. Pt also requiring scoot assist to move towards EOB, step-by-step sequencing cues.    Transfers Overall transfer level: Needs assistance Equipment used: Rolling walker (2 wheels) Transfers: Sit to/from Stand Sit to Stand: Mod assist;+2 safety/equipment           General transfer comment: mod +2 for power up, rise, steadying. Standing tolerance x1 minute, limited by hypotension.     Balance Overall balance assessment: Needs assistance;History of Falls Sitting-balance support: Bilateral upper extremity supported;Feet supported Sitting balance-Leahy Scale: Fair Sitting balance - Comments: able to static sit on EOB   Standing balance support: Bilateral upper extremity supported;Reliant on assistive device for balance Standing balance-Leahy Scale: Poor                             ADL either performed or assessed with clinical judgement   ADL Overall ADL's : Needs assistance/impaired Eating/Feeding: Moderate assistance   Grooming: Moderate assistance   Upper Body Bathing: Maximal assistance   Lower Body Bathing: Maximal assistance   Upper Body Dressing : Maximal assistance   Lower Body Dressing: Maximal assistance         Toileting - Clothing Manipulation Details (indicate cue type and reason): incontinence of bowel and lack of awareness. pt with very loose stool. pt states "i thought it was just gas and thats what happen before"       General ADL Comments: pt noted to have decreased BP and limiting session. pt starting to have some symptoms  Extremity/Trunk Assessment Upper Extremity Assessment Upper Extremity Assessment: Generalized weakness (noted to have tremor not present prior)   Lower Extremity Assessment Lower Extremity Assessment: Defer to PT evaluation        Vision        Perception     Praxis      Cognition Arousal/Alertness: Awake/alert Behavior During Therapy: Flat affect Overall Cognitive Status: Impaired/Different from baseline Area of Impairment: Safety/judgement;Memory;Attention;Following commands;Problem solving                   Current Attention Level: Sustained Memory: Decreased recall of precautions Following Commands: Follows one step commands with increased time Safety/Judgement: Decreased awareness of deficits   Problem Solving: Slow processing;Decreased initiation;Difficulty sequencing;Requires verbal cues;Requires tactile cues General Comments: pt with tangential topics not related to session and poor awareness to what has occurred in session as pt is trying to describe to someone on the phone. pt no recall of previous sessions. pt inconsistent reports of tremors          Exercises     Shoulder Instructions       General Comments HR Max 161 BP 148/76 down to 105/90 then returning to 146/76 supine.    Pertinent Vitals/ Pain       Pain Assessment: Faces Faces Pain Scale: Hurts even more Pain Location: back Pain Descriptors / Indicators: Discomfort;Grimacing;Sore Pain Intervention(s): Limited activity within patient's tolerance;Monitored during session;Premedicated before session;Repositioned  Home Living                                          Prior Functioning/Environment              Frequency  Min 2X/week        Progress Toward Goals  OT Goals(current goals can now be found in the care plan section)  Progress towards OT goals: Not progressing toward goals - comment  Acute Rehab OT Goals Patient Stated Goal: none stated limited this session by BP Time For Goal Achievement: 10/17/21 Potential to Achieve Goals: Good ADL Goals Pt Will Perform Grooming: with min guard assist;sitting Pt Will Perform Lower Body Dressing: with supervision;with adaptive equipment;sit to/from  stand Pt Will Transfer to Toilet: with min guard assist;bedside commode;ambulating Additional ADL Goal #1: pt will complete bed mobility supervision level for adls.  Plan Discharge plan needs to be updated    Co-evaluation    PT/OT/SLP Co-Evaluation/Treatment: Yes Reason for Co-Treatment: Complexity of the patient's impairments (multi-system involvement);Necessary to address cognition/behavior during functional activity;For patient/therapist safety   OT goals addressed during session: ADL's and self-care;Proper use of Adaptive equipment and DME;Strengthening/ROM      AM-PAC OT "6 Clicks" Daily Activity     Outcome Measure   Help from another person eating meals?: A Little Help from another person taking care of personal grooming?: A Little Help from another person toileting, which includes using toliet, bedpan, or urinal?: A Little Help from another person bathing (including washing, rinsing, drying)?: A Little Help from another person to put on and taking off regular upper body clothing?: A Little Help from another person to put on and taking off regular lower body clothing?: A Little 6 Click Score: 18    End of Session Equipment Utilized During Treatment: Gait belt;Rolling walker (2 wheels);Back brace  OT Visit Diagnosis: Unsteadiness on feet (R26.81);Muscle weakness (generalized) (M62.81);Pain   Activity Tolerance Patient tolerated treatment well  Patient Left with call bell/phone within reach;with chair alarm set;in bed;with nursing/sitter in room (RN in room and starting more fluids)   Nurse Communication Mobility status;Patient requests pain meds        Time: 3435-6861 OT Time Calculation (min): 24 min  Charges: OT General Charges $OT Visit: 1 Visit OT Treatments $Self Care/Home Management : 8-22 mins  Brynn, OTR/L  Acute Rehabilitation Services Pager: [phone] Office: [phone] .   Jeri Modena 10/09/2021, 6:02 PM

## 2021-10-09 NOTE — Progress Notes (Signed)
   10/09/21 0027  Assess: MEWS Score  Temp (!) 101 F (38.3 C)  BP 95/67  Pulse Rate (!) 152  Resp 20  Level of Consciousness Alert  SpO2 99 %  O2 Device Nasal Cannula  O2 Flow Rate (L/min) 2 L/min  Assess: MEWS Score  MEWS Temp 1  MEWS Systolic 1  MEWS Pulse 3  MEWS RR 0  MEWS LOC 0  MEWS Score 5  MEWS Score Color Red  Assess: if the MEWS score is Yellow or Red  Were vital signs taken at a resting state? Yes  Focused Assessment No change from prior assessment  Early Detection of Sepsis Score *See Row Information* High  MEWS guidelines implemented *See Row Information* Yes  Treat  MEWS Interventions Administered prn meds/treatments;Escalated (See documentation below)  Pain Scale 0-10  Pain Score 0  Take Vital Signs  Increase Vital Sign Frequency  Red: Q 1hr X 4 then Q 4hr X 4, if remains red, continue Q 4hrs  Escalate  MEWS: Escalate Red: discuss with charge nurse/RN and provider, consider discussing with RRT  Notify: Charge Nurse/RN  Name of Charge Nurse/RN Notified Gladys, RN  Date Charge Nurse/RN Notified 10/09/21  Time Charge Nurse/RN Notified [id number]  Notify: Provider  Provider Name/Title Margo Aye, NP  Date Provider Notified 10/09/21  Time Provider Notified [phone]  Notification Type Page  Notification Reason Change in status  Provider response See new orders  Date of Provider Response 10/09/21  Time of Provider Response 0045  Notify: Rapid Response  Name of Rapid Response RN Notified Anselm Pancoast, RN  Date Rapid Response Notified 10/09/21  Time Rapid Response Notified 0020  Document  Patient Outcome Stabilized after interventions  Progress note created (see row info) Yes

## 2021-10-09 NOTE — Consult Note (Addendum)
Cardiology Consultation:   Patient ID: Bryan [name] MRN: [id number]; DOB: 1946-11-09  Admit date: 10/02/2021 Date of Consult: 10/09/2021  Primary Care Provider: [name], FNP Primary Cardiologist: Sanda Klein, MD  Primary Electrophysiologist:  None    Patient Profile:   Bryan [name] is a 75 y.o. male with a hx of stage Iib marginal zone lymphoma, rheumatoid arthritis coronary disease who is being seen today for the evaluation of SVT at the request of neurosurgery.  History of Present Illness:   Bryan [name] is a 75 year old male with a history of marginal zone lymphoma, rheumatoid arthritis, coronary artery disease who is status post lumbar surgery on 10/02/2021.  He is overall been recovering well, however has been fevers over the last couple days.  Notably, on the evening of 11/27 he developed an SVT with heart rates in the 150s.  He is having lower blood pressures.  Cardiology was consulted by the neurosurgical team for assistance in management.  Upon my arrival, patient was alert but confused.  Blood pressures were in the 100s over 60s.  Heart rates were persistently in the 150s.  No laboratory evaluation have been noted.  ECG showed either coarse A. fib with RVR or atrial flutter with variable AV block.  Notably he has a right bundle branch block on the ECG.  Past Medical History:  Diagnosis Date   Anemia    Arthritis    hands, knees, cervical area. Back pain. Rheumatoid arthritis- weekly injections.   BPH (benign prostatic hypertrophy)    Cancer (HCC)    Diabetes (HCC)    Essential tremor    GERD (gastroesophageal reflux disease)    reports for indigestion he uses mustard    Hearing deficit    wears hearing aids bilateral   HTN (hypertension)    Obesity    OSA (obstructive sleep apnea)    Refusal of blood transfusions as patient is Jehovah's Witness    Rheumatoid arthritis ([place])    Right bundle branch block    history of   Seizures ([place])    AS A CHILD.  only esential tremors now.   Sleep apnea    cpap - settings at 9 per patient    Ulcer     Past Surgical History:  Procedure Laterality Date   ANTERIOR CERVICAL DECOMP/DISCECTOMY FUSION N/A 03/30/2013   Procedure: ANTERIOR CERVICAL DECOMPRESSION/DISCECTOMY FUSION 2 LEVELS;  Surgeon: Otilio Connors, MD;  Location: [facility];  Service: Neurosurgery;  Laterality: N/A;  C4-5 C5-6 Anterior cervical decompression/diskectomy/fusion/Allograft/Plate   CHOLECYSTECTOMY N/A 12/07/2013   Procedure: LAPAROSCOPIC CHOLECYSTECTOMY WITH INTRAOPERATIVE CHOLANGIOGRAM;  Surgeon: Adin Hector, MD;  Location: [facility];  Service: General;  Laterality: N/A;   COLONOSCOPY     DECOMPRESSIVE LUMBAR LAMINECTOMY LEVEL 2 N/A 02/15/2015   Procedure: COMPLETE DECOMPRESSIVE LUMBAR LAMINECTOMY L4-L5/ FORAMINOTOMY TO L4 NERVE ROOT AND L5 NERVE ROOT BILATERALLY;  Surgeon: [name], MD;  Location: [facility];  Service: Orthopedics;  Laterality: N/A;   EYE SURGERY     right, growth excision   LUMBAR LAMINECTOMY/DECOMPRESSION MICRODISCECTOMY Left 01/18/2016   Procedure:  DECOMPRESSION L4-L5 MICRODISCECTOMY L4-L5 ON LEFT FOR SPINAL STENOSIS;  Surgeon: [name], MD;  Location: [facility];  Service: Orthopedics;  Laterality: Left;   PAROTIDECTOMY Left 01/24/2018   Procedure: INCISIONAL BIOPSY OF LEFT PAROTID    MASS;  Surgeon: Helayne Seminole, MD;  Location: [facility];  Service: ENT;  Laterality: Left;   [place]  VASECTOMY     WRIST GANGLION EXCISION Left      Home Medications:  Prior to Admission medications   Medication Sig Start Date End Date Taking? Authorizing Provider  atorvastatin (LIPITOR) 20 MG tablet Take 20 mg by mouth at bedtime.   Yes [provider]  gabapentin (NEURONTIN) 600 MG tablet Take 600 mg by mouth 2 (two) times daily. 01/05/20  Yes [provider]  hydrochlorothiazide (HYDRODIURIL) 25 MG tablet Take 1 tablet (25 mg total) by mouth daily. 12/24/19  Yes  Stallings, Zoe A, MD  methocarbamol (ROBAXIN) 750 MG tablet Take 750 mg by mouth daily.   Yes [provider]  metoprolol succinate (TOPROL-XL) 100 MG 24 hr tablet TAKE 1 TABLET BY MOUTH ONCE DAILY WITH  OR  IMMEDIATELY  FOLLOWING  A  MEAL Patient taking differently: 100 mg 2 (two) times daily. 05/10/20  Yes Patwardhan, Manish J, MD  nitroGLYCERIN (NITROSTAT) 0.4 MG SL tablet Place 0.4 mg under the tongue every 5 (five) minutes as needed for chest pain.   Yes [provider]  primidone (MYSOLINE) 50 MG tablet Take 150 mg by mouth in the morning and at bedtime.   Yes [provider]  Tamsulosin HCl (FLOMAX) 0.4 MG CAPS Take 0.4 mg by mouth at bedtime.   Yes [provider]  traMADol-acetaminophen (ULTRACET) 37.5-325 MG tablet Take 1 tablet by mouth in the morning and at bedtime.   Yes [provider]  Cyanocobalamin (B-12) 1000 MCG SUBL Place 2,000 mcg under the tongue daily. 10/04/21   [name], MD  iron polysaccharides (NIFEREX) 150 MG capsule Take 1 capsule (150 mg total) by mouth daily. 10/04/21   [name], MD  rosuvastatin (CRESTOR) 20 MG tablet Take 1 tablet (20 mg total) by mouth daily. Patient not taking: Reported on 10/03/2021 09/29/21 01/27/22  Alethia Berthold, PA-C    Inpatient Medications: Scheduled Meds:  Chlorhexidine Gluconate Cloth  6 each Topical Daily   gabapentin  600 mg Oral BID   hydrochlorothiazide  25 mg Oral Daily   metoprolol succinate  100 mg Oral Daily   pantoprazole  40 mg Oral QHS   primidone  150 mg Oral QHS   rosuvastatin  20 mg Oral Daily   sodium chloride flush  3 mL Intravenous Q12H   tamsulosin  0.4 mg Oral QHS   Continuous Infusions:  sodium chloride 250 mL (10/03/21 0533)   amiodarone     Followed by   amiodarone     sodium chloride     PRN Meds: acetaminophen **OR** acetaminophen, alum & mag hydroxide-simeth, cyclobenzaprine, hydrALAZINE, HYDROmorphone (DILAUDID) injection,  menthol-cetylpyridinium **OR** phenol, nitroGLYCERIN, ondansetron **OR** ondansetron (ZOFRAN) IV, oxyCODONE, sodium chloride, sodium chloride flush, traMADol-acetaminophen  Allergies:    Allergies  Allergen Reactions   Adalimumab Other (See Comments)    (Humira) Lymphoma   Ampicillin Nausea Only   Terazosin Other (See Comments)   Lisinopril Other (See Comments) and Cough    Cough   Other Other (See Comments)    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, fast food)     Social History:   Social History   Socioeconomic History   Marital status: Married    Spouse name: Not on file   Number of children: 2   Years of education: Not on file   Highest education level: Not on file  Occupational History   Occupation: bus driver    Employer: RETIRED  Tobacco Use   Smoking status: Former  Packs/day: 0.50    Years: 14.00    Pack years: 7.00    Types: Cigarettes    Quit date: 12/14/1972    Years since quitting: 48.8   Smokeless tobacco: Never  Vaping Use   Vaping Use: Never used  Substance and Sexual Activity   Alcohol use: Yes    Comment: occasionally   Drug use: No   Sexual activity: Yes    Birth control/protection: None  Other Topics Concern   Not on file  Social History Narrative   Lives with wife   Social Determinants of Health   Financial Resource Strain: Not on file  Food Insecurity: Not on file  Transportation Needs: Not on file  Physical Activity: Not on file  Stress: Not on file  Social Connections: Not on file  Intimate Partner Violence: Not on file    Family History:    Family History  Problem Relation Age of Onset   Colon cancer Maternal Grandfather    Kidney disease Brother    Hypertension Brother    Diabetes Brother    Stroke Maternal Grandmother    Diabetes Mother    Hypertension Mother    CAD Father        died of MI at age 65   Hypertension Father    Diabetes Paternal Aunt        x 4 aunts     Review of Systems: [y] = yes, [ ]  = no     General: Weight gain [ ] ; Weight loss [ ] ; Anorexia [ ] ; Fatigue [ ] ; Fever [ ] ; Chills [ ] ; Weakness [ ]   Cardiac: Chest pain/pressure [ ] ; Resting SOB [ ] ; Exertional SOB [ ] ; Orthopnea [ ] ; Pedal Edema [ ] ; Palpitations [ ] ; Syncope [ ] ; Presyncope [ ] ; Paroxysmal nocturnal dyspnea[ ]   Pulmonary: Cough [ ] ; Wheezing[ ] ; Hemoptysis[ ] ; Sputum [ ] ; Snoring [ ]   GI: Vomiting[ ] ; Dysphagia[ ] ; Melena[ ] ; Hematochezia [ ] ; Heartburn[ ] ; Abdominal pain [ ] ; Constipation [ ] ; Diarrhea [ ] ; BRBPR [ ]   GU: Hematuria[ ] ; Dysuria [ ] ; Nocturia[ ]   Vascular: Pain in legs with walking [ ] ; Pain in feet with lying flat [ ] ; Non-healing sores [ ] ; Stroke [ ] ; TIA [ ] ; Slurred speech [ ] ;  Neuro: Headaches[ ] ; Vertigo[ ] ; Seizures[ ] ; Paresthesias[ ] ;Blurred vision [ ] ; Diplopia [ ] ; Vision changes [ ]   Ortho/Skin: Arthritis [ ] ; Joint pain [ ] ; Muscle pain [ ] ; Joint swelling [ ] ; Back Pain [ ] ; Rash [ ]   Psych: Depression[ ] ; Anxiety[ ]   Heme: Bleeding problems [ ] ; Clotting disorders [ ] ; Anemia [ ]   Endocrine: Diabetes [ ] ; Thyroid dysfunction[ ]   Physical Exam/Data:   Vitals:   10/09/21 0040 10/09/21 0100 10/09/21 0105 10/09/21 0110  BP: (!) 96/58 94/72 (!) 88/56 (!) 88/70  Pulse: (!) 150 (!) 115  (!) 153  Resp:      Temp:      TempSrc:      SpO2: 100% 100%  97%  Weight:      Height:        Intake/Output Summary (Last 24 hours) at 10/09/2021 0239 Last data filed at 10/09/2021 0100 Gross per 24 hour  Intake 820 ml  Output 3050 ml  Net -2230 ml   Filed Weights   10/02/21 1041  Weight: 101.6 kg   Body mass index is 36.71 kg/m.  General:  no acute distress HEENT: normal Lymph: no adenopathy Neck: no  JVD Endocrine:  No thryomegaly Vascular: No carotid bruits; FA pulses 2+ bilaterally without bruits  Cardiac:  normal S1, S2; tachycardic Lungs:  clear to auscultation bilaterally, no wheezing, rhonchi or rales  Abd: soft, nontender, no hepatomegaly  Ext: no edema Musculoskeletal:   No deformities, BUE and BLE strength normal and equal Skin: warm and dry  Neuro:  CNs 2-12 intact, no focal abnormalities noted Psych:  Normal affect   EKG:  The EKG was personally reviewed and demonstrates:  afib/flutter Telemetry:  Telemetry was personally reviewed and demonstrates:  HR in the 150s  Relevant CV Studies: none  Laboratory Data:  Chemistry Recent Labs  Lab 10/03/21 1057  NA 135  K 4.0  CL 99  CO2 27  GLUCOSE 139*  BUN 36*  CREATININE 2.72*  CALCIUM 7.6*  GFRNONAA 23*  ANIONGAP 9    No results for input(s): PROT, ALBUMIN, AST, ALT, ALKPHOS, BILITOT in the last 168 hours. Hematology Recent Labs  Lab 10/03/21 1057 10/05/21 1800  WBC 10.3 11.1*  RBC 2.78* 2.33*  HGB 8.8* 7.3*  HCT 27.0* 22.8*  MCV 97.1 97.9  MCH 31.7 31.3  MCHC 32.6 32.0  RDW 13.8 13.6  PLT 187 202   Cardiac EnzymesNo results for input(s): TROPONINI in the last 168 hours. No results for input(s): TROPIPOC in the last 168 hours.  BNPNo results for input(s): BNP, PROBNP in the last 168 hours.  DDimer No results for input(s): DDIMER in the last 168 hours.  Radiology/Studies:  DG Chest Port 1 View  Result Date: 10/07/2021 CLINICAL DATA:  Fever EXAM: PORTABLE CHEST 1 VIEW COMPARISON:  02/22/2020 and prior studies FINDINGS: UPPER limits normal heart size noted. Mild peribronchial thickening is unchanged. There is no evidence of focal airspace disease, pulmonary edema, suspicious pulmonary nodule/mass, pleural effusion, or pneumothorax. No acute bony abnormalities are identified. IMPRESSION: No active disease. Electronically Signed   By: Margarette Canada M.D.   On: 10/07/2021 11:27    Assessment and Plan:   Atrial fibrillation vs Aflutter with variable AV block.  Given that he did not slow down at all with metoprolol and his blood pressures are on the softer side, recommend bolus IV amiodarone followed by infusion.  This is the safest way to slow his rates.  However given his ongoing fevers and  new tachycardia, worry about a systemic infection and as the driver.  Recommend evaluation for infection including blood and urine cultures and repeat labs.  Also recommend repeat echo once his heart rates are slowed.  Given concern for infection, we will not be overly aggressive in lowering heart rates.  Recommend empiric antibiotics. We will continue to follow       For questions or updates, please contact [place] Please consult [email or web address] for contact info under     Signed, Doyne Keel, MD  10/09/2021 2:39 AM  Agree with APP above with the following comments: - With new fevers there is likely a driver for the new RVR - hemodynamically stable but with persistent tachycardia - short term continue IV amiodarone  Patient's primary cardiologist is Dr. Virgina Jock.  We will notify him so that he may resume care.  Rudean Haskell, MD [address] [place], [place] [address] [phone]  11:31 AM

## 2021-10-09 NOTE — Progress Notes (Signed)
PHARMACY NOTE:  ANTIMICROBIAL RENAL DOSAGE ADJUSTMENT  Current antimicrobial regimen includes a mismatch between antimicrobial dosage and estimated renal function.  As per policy approved by the Pharmacy & Therapeutics and Medical Executive Committees, the antimicrobial dosage will be adjusted accordingly.  Current antimicrobial dosage:  Levaquin 500 mg by mouth daily  Indication: Fevers / ?Pna  Renal Function:  Estimated Creatinine Clearance: 30.1 mL/min (A) (by C-G formula based on SCr of 2.31 mg/dL (H)). []      On intermittent HD, scheduled: []      On CRRT    Antimicrobial dosage has been changed to:  Levaquin 500 mg x 1 dose followed by 250 mg daily for CrCl ~ 30 mL/min   Additional comments:   Thank you for allowing pharmacy to be a part of this patient's care.  Albertina Parr, PharmD., BCPS, BCCCP Clinical Pharmacist Please refer to [facility] for unit-specific pharmacist

## 2021-10-09 NOTE — Progress Notes (Signed)
Patient got up to stand on the side of the bed with PT. Lying - 148/76. Sitting 105/62. Standing 105/90. Patient could count from 1 to 10 successfully. Unsuccessfully attempted to recite A to Z. Patient says he was a little dizzy while standing. WCTM.  Shelbie Proctor, RN

## 2021-10-09 NOTE — Progress Notes (Signed)
  Amiodarone Drug - Drug Interaction Consult Note  Recommendations: Monitor Amiodarone is metabolized by the cytochrome P450 system and therefore has the potential to cause many drug interactions. Amiodarone has an average plasma half-life of 50 days (range 20 to 100 days).   There is potential for drug interactions to occur several weeks or months after stopping treatment and the onset of drug interactions may be slow after initiating amiodarone.   [x]  Statins: Increased risk of myopathy. Counsel patients to report any muscle pain or weakness immediately.   [x]  Beta blockers: increased risk of bradycardia, AV block and myocardial depression.   Thank You,  Sherlon Handing, PharmD, BCPS Please see amion for complete clinical pharmacist phone list 10/09/2021 1:46 AM

## 2021-10-09 NOTE — Clinical Note (Incomplete)
Paatient temp

## 2021-10-09 NOTE — Progress Notes (Signed)
Rapid Response Progress Note  Notified by RN of pt with fever of 102F. Pt received Tylenol at 1441. Recommended RN decrease the room temperature, reapply ice packs, provide a bedside fan, and increased frequency of BP checks. RN encouraged to give PRN Tylenol at [place], if fever persists. Requested RN reach out to attending MD and consider IVF bolus for additional temperature management.   VS: T 102F, BP 117/81, HR 100, RR 21, SpO2 96% on [facility]  Call time: 1723 Evaluation time: [place] Rapid Response RN

## 2021-10-09 NOTE — Consult Note (Addendum)
Medical Consultation   [name]  WUJ:[id number]  DOB: [birth year]  DOA: 10/02/2021  PCP: [name], FNP   Outpatient Specialists: [facility] - oncology; [facility] - cardiology; Trudie Reed - rheumatology    Requesting physician: Saintclair Halsted - neurosurgery  Reason for consultation: (Per Dr. Marlowe Sax) Neurosurgery NP requesting consultation from [facility] for fevers/sepsis.  Patient underwent lumbar fusion surgery 7 days ago.  For the past 2 days he is having fevers.  Tonight went into A. fib with RVR and more confused.  Hemoglobin 6.1 on labs, no leukocytosis.  Initial lactic acid normal.  Has an indwelling Foley catheter due to urinary retention, UA pending.  Blood cultures drawn.  Chest x-ray done 2 days ago was negative for pneumonia.  Neurosurgery does not think this is postop infection.  Cardiology was consulted tonight for A. fib with RVR and patient started on IV amiodarone.   History of Present Illness: Bryan [name] is an 75 y.o. male with h/o marginal zone B-cell lymphoma; OSA; HTN; CAD; RA; BPH; DM; obesity; and refusal of blood products/Jehovah's witness who presented on 11/21 for lumbar spinal laminectomy/fusion.  He developed fever and confusion 2 days ago and afib with RVR last night.  Patient was seen this AM and had no complaints.  He reported having had a rough night but feeling well this AM.  He was A&O x 3.     Review of Systems:  ROS As per HPI otherwise review of systems negative.    Past Medical History: Past Medical History:  Diagnosis Date   Anemia    Arthritis    hands, knees, cervical area. Back pain. Rheumatoid arthritis- weekly injections.   BPH (benign prostatic hypertrophy)    Cancer (HCC)    Diabetes (HCC)    Essential tremor    GERD (gastroesophageal reflux disease)    reports for indigestion he uses mustard    Hearing deficit    wears hearing aids bilateral   HTN (hypertension)    Obesity    OSA (obstructive sleep apnea)    Refusal of  blood transfusions as patient is Jehovah's Witness    Rheumatoid arthritis ([place])    Right bundle branch block    history of   Seizures ([place])    AS A CHILD. only esential tremors now.   Sleep apnea    cpap - settings at 9 per patient    Ulcer     Past Surgical History: Past Surgical History:  Procedure Laterality Date   ANTERIOR CERVICAL DECOMP/DISCECTOMY FUSION N/A 03/30/2013   Procedure: ANTERIOR CERVICAL DECOMPRESSION/DISCECTOMY FUSION 2 LEVELS;  Surgeon: Otilio Connors, MD;  Location: [place] NEURO ORS;  Service: Neurosurgery;  Laterality: N/A;  C4-5 C5-6 Anterior cervical decompression/diskectomy/fusion/Allograft/Plate   CHOLECYSTECTOMY N/A 12/07/2013   Procedure: LAPAROSCOPIC CHOLECYSTECTOMY WITH INTRAOPERATIVE CHOLANGIOGRAM;  Surgeon: Adin Hector, MD;  Location: [facility] ORS;  Service: General;  Laterality: N/A;   COLONOSCOPY     DECOMPRESSIVE LUMBAR LAMINECTOMY LEVEL 2 N/A 02/15/2015   Procedure: COMPLETE DECOMPRESSIVE LUMBAR LAMINECTOMY L4-L5/ FORAMINOTOMY TO L4 NERVE ROOT AND L5 NERVE ROOT BILATERALLY;  Surgeon: [name], MD;  Location: [facility] ORS;  Service: Orthopedics;  Laterality: N/A;   EYE SURGERY     right, growth excision   LUMBAR LAMINECTOMY/DECOMPRESSION MICRODISCECTOMY Left 01/18/2016   Procedure:  DECOMPRESSION L4-L5 MICRODISCECTOMY L4-L5 ON LEFT FOR SPINAL STENOSIS;  Surgeon: [name], MD;  Location: [facility] ORS;  Service: Orthopedics;  Laterality: Left;   PAROTIDECTOMY Left 01/24/2018   Procedure: INCISIONAL BIOPSY OF LEFT PAROTID    MASS;  Surgeon: Helayne Seminole, MD;  Location: [facility] OR;  Service: ENT;  Laterality: Left;   SPINE SURGERY     TONSILLECTOMY     VASECTOMY     WRIST GANGLION EXCISION Left      Allergies:   Allergies  Allergen Reactions   Adalimumab Other (See Comments)    (Humira) Lymphoma   Ampicillin Nausea Only   Terazosin Other (See Comments)   Lisinopril Other (See Comments) and Cough    Cough   Other Other (See Comments)    BLOOD PRODUCT  REFUSAL (patient is a Sales promotion account executive Witness)      Social History:  reports that he quit smoking about 48 years ago. His smoking use included cigarettes. He has a 7.00 pack-year smoking history. He has never used smokeless tobacco. He reports current alcohol use. He reports that he does not use drugs.   Family History: Family History  Problem Relation Age of Onset   Colon cancer Maternal Grandfather    Kidney disease Brother    Hypertension Brother    Diabetes Brother    Stroke Maternal Grandmother    Diabetes Mother    Hypertension Mother    CAD Father        died of MI at age 60   Hypertension Father    Diabetes Paternal Aunt        x 4 aunts      Physical Exam: Vitals:   10/09/21 0751 10/09/21 1200 10/09/21 1436 10/09/21 1600  BP:  126/74 (!) 142/57 (!) 143/78  Pulse: 88     Resp:      Temp: 98.1 F (36.7 C) 98.7 F (37.1 C) (!) 100.8 F (38.2 C) 98.4 F (36.9 C)  TempSrc: Oral Oral Oral Oral  SpO2: 91%     Weight:      Height:        Constitutional: Alert and awake, oriented x3, not in any acute distress. Eyes: PERLA, EOMI, irises appear normal, anicteric sclera,  ENMT: external ears and nose appear normal, normal hearing, Lips appear normal Neck: neck appears normal, no masses, normal ROM CVS: S1-S2 clear, no murmur rubs or gallops, no LE edema Respiratory:  clear to auscultation bilaterally, no wheezing, rales or rhonchi. Respiratory effort normal. No accessory muscle use.  Abdomen: soft nontender, nondistended Musculoskeletal: : no cyanosis, clubbing or edema noted bilaterally Neuro: Cranial nerves II-XII grossly intact Psych: judgement and insight appear normal, stable mood and affect, mental status Skin: no rashes or lesions or ulcers, no induration or nodules    Data reviewed:  I have personally reviewed the recent labs and imaging studies  Pertinent Labs:   Glucose 132 BUN 47/Creatinine 2.31/GFR 29; 21/1.35/54 on 11/16 AP 179 Albumin 2.0 AST  88/ALT 47 Lactate 0.9, 1.0 WBC 8.3 Hgb 6.1 Blood and urine cultures pending 11/24 UA: moderate Hgb, trace LE, rare bacteria   Inpatient Medications:   Scheduled Meds:  Chlorhexidine Gluconate Cloth  6 each Topical Daily   gabapentin  600 mg Oral BID   hydrochlorothiazide  25 mg Oral Daily   iron polysaccharides  150 mg Oral Daily   [START ON 10/10/2021] levofloxacin  250 mg Oral Daily   metoprolol succinate  100 mg Oral Daily   pantoprazole  40 mg Oral QHS   primidone  150 mg Oral QHS   rosuvastatin  20 mg Oral Daily   sodium  chloride flush  3 mL Intravenous Q12H   tamsulosin  0.4 mg Oral QHS   Continuous Infusions:  sodium chloride 250 mL (10/03/21 0533)   sodium chloride 75 mL/hr at 10/09/21 0434   amiodarone 30 mg/hr (10/09/21 0800)   sodium chloride       Radiological Exams on Admission: DG Chest 1 View  Result Date: 10/09/2021 CLINICAL DATA:  Fever today, history diabetes mellitus, hypertension, past history lymphoma EXAM: CHEST  1 VIEW COMPARISON:  Portable exam 0830 hours compared to 10/07/2021 and 02/22/2020. FINDINGS: Normal heart size and pulmonary vascularity. Enlargement of hila especially of LEFT hilum increased since 02/22/2020 Lungs clear. No definite infiltrate, pleural effusion, or pneumothorax. Prior cervical fusion. IMPRESSION: Enlargement of pulmonary hila especially LEFT hilum, cannot exclude adenopathy; CT chest with contrast recommended for further assessment. Electronically Signed   By: Lavonia Dana M.D.   On: 10/09/2021 08:41    Impression/Recommendations Principal Problem:   Spinal stenosis of lumbar region Active Problems:   BPH (benign prostatic hyperplasia)   Essential hypertension   Rheumatoid arthritis (HCC)   Refusal of blood transfusions as patient is Jehovah's Witness   Marginal zone lymphoma ([place])   Severe sepsis ([place])   New onset atrial fibrillation (HCC)   Acute blood loss anemia  Spinal stenosis -s/p spinal fusion -Followed by  Dr. Saintclair Halsted  Sepsis -SIRS criteria in this patient includes: Fever, tachycardia, tachypnea  -Patient has evidence of acute organ failure with recurrent hypotension that is not easily explained by another condition. -While awaiting blood cultures, this appears to be a preseptic condition. -Sepsis protocol initiated -Suspected source is uncertain; CXR showed possible hilar LAD and so will order chest CT (no contrast given AKI) -Post-operative infection is also a consideration, particularly with persistently worsening anemia -Blood and urine cultures pending -He is currently on Levaquin, will continue for now -If infection occurs in a patient on a biologic treatment, the biologic should be stopped until the infection clears. Plaquenil was not listed on his admission meds and so is not continued at this time.  Afib with RVR -Patient presenting with new-onset afib.  -Etiology is thought to be related to infection -Cardiology consulted -Cardiology started Amiodarone -Not currently an [facility] candidate given concern for dropping Hgb  HTN -Takes low-dose Coreg monotherapy at home -Patient with suboptimal control while in the ER -Will add Lisinopril 10 mg PO daily  -Will also add prn hydralazine  HLD -Continue Lipitor (previously on Crestor and this was started on admission, but will change back to home Lipitor at this time)  AKI on stage 3a CKD -Hold HCTZ -Continue IVF hydration and recheck labs in AM  Anemia -Patient is Jehovah's witness and will not accept blood products -Hgb on admission was 12.2, post-operatively 8.8, 7.3, 6.1 -Resume Niferex -Erythropoietin is an option but is suspended in human albumin and so this is only a consideration if his church will allow it -I attempted to call and discuss with his wife but was unable to reach her on either listed telephone number  B cell lymphoma -Currently in remission per 11/15 oncology note -Completed Rituxan in 2019 with complete  metabolic response to therapy  RA -Previously on methotrexate weekly and prednisone daily but off these medications and currently on Plaquenil -Also on Rituxan  BPH -Has obstructive uropathy and an indwelling foley -Urine looks remarkably clean but repeat culture is pending -Continue Flomax    Thank you for this consultation.  Our [facility] hospitalist team will follow the patient with  you.   Time Spent: 50 minutes  Karmen Bongo M.D. [facility] 10/09/2021, 7:00 PM

## 2021-10-09 NOTE — Progress Notes (Signed)
Physical Therapy Treatment Patient Details Name: Bryan [name] MRN: [id number] DOB: Jun 30, 1946 Today's Date: 10/09/2021   History of Present Illness 75 yo male s/p L2-4 decompression and fusion, exploration of L4-S1 fusion, redo L5-S1 fusion on 11/22. Developed tachycardia up to 150s on 11/27, cardiology following. PMH includes OA, BPH, cancer, DM, obesity, RA, seizures, ACDF 2014, lap chole, lumbar surgeries 2016-2017.    PT Comments    Pt with tachycardia from 110s-130s at rest, RN premedicated pt and okayed mobility. Pt requiring increased physical assist of two people for bed mobility and transfer into standing, pt limited in tolerance due to pain and + orthostatic drop in BP (SBP 140s supine to 105 standing). PT initiated LE strengthening program in supine given pt's limited OOB tolerance, RLE remains weaker than L at this time. PT to continue to follow.     Recommendations for follow up therapy are one component of a multi-disciplinary discharge planning process, led by the attending physician.  Recommendations may be updated based on patient status, additional functional criteria and insurance authorization.  Follow Up Recommendations  Skilled nursing-short term rehab (<3 hours/day)     Assistance Recommended at Discharge Frequent or constant Supervision/Assistance  Equipment Recommendations  Rolling walker (2 wheels);BSC/3in1    Recommendations for Other Services       Precautions / Restrictions Precautions Precautions: Fall;Back Precaution Booklet Issued: Yes (comment) Required Braces or Orthoses: Spinal Brace Spinal Brace: Lumbar corset;Applied in sitting position Restrictions Weight Bearing Restrictions: No     Mobility  Bed Mobility Overal bed mobility: Needs Assistance Bed Mobility: Rolling;Sidelying to Sit;Sit to Sidelying Rolling: Mod assist Sidelying to sit: Mod assist;+2 for physical assistance     Sit to sidelying: Mod assist;+2 for physical  assistance General bed mobility comments: mod +2 for log roll to/from EOB for trunk and LE management. Pt also requiring scoot assist to move towards EOB, step-by-step sequencing cues.    Transfers Overall transfer level: Needs assistance Equipment used: Rolling walker (2 wheels) Transfers: Sit to/from Stand Sit to Stand: Mod assist;+2 safety/equipment           General transfer comment: mod +2 for power up, rise, steadying. Standing tolerance x1 minute, limited by hypotension.    Ambulation/Gait                   Stairs             Wheelchair Mobility    Modified Rankin (Stroke Patients Only)       Balance Overall balance assessment: Needs assistance;History of Falls Sitting-balance support: Bilateral upper extremity supported;Feet supported Sitting balance-Leahy Scale: Fair     Standing balance support: Bilateral upper extremity supported;Reliant on assistive device for balance Standing balance-Leahy Scale: Poor Standing balance comment: reliant on RW for steadying                            Cognition Arousal/Alertness: Awake/alert (periods of drowsiness) Behavior During Therapy: Flat affect Overall Cognitive Status: Impaired/Different from baseline Area of Impairment: Safety/judgement;Memory;Attention;Following commands;Problem solving                   Current Attention Level: Sustained Memory: Decreased recall of precautions Following Commands: Follows one step commands with increased time Safety/Judgement: Decreased awareness of deficits   Problem Solving: Slow processing;Decreased initiation;Difficulty sequencing;Requires verbal cues;Requires tactile cues General Comments: pt at times tangential, makes off-the-wall statements. Requires step-by-step cuing for mobility, at times repeated cuing for safety.  Exercises General Exercises - Lower Extremity Short Arc QuadSinclair Ship;Both;15 reps;Supine Heel Slides:  AAROM;Right;10 reps;AROM;Left;15 reps;Supine    General Comments General comments (skin integrity, edema, etc.): SBP 140s in supine, BP sitting EOB 105/60, standing 105/90 with AMS.      Pertinent Vitals/Pain Pain Assessment: Faces Faces Pain Scale: Hurts even more Pain Location: back Pain Descriptors / Indicators: Discomfort;Grimacing;Sore Pain Intervention(s): Limited activity within patient's tolerance;Monitored during session;Repositioned    Home Living                          Prior Function            PT Goals (current goals can now be found in the care plan section) Acute Rehab PT Goals Patient Stated Goal: to get stronger PT Goal Formulation: With patient Time For Goal Achievement: 10/17/21 Potential to Achieve Goals: Good Progress towards PT goals: Progressing toward goals    Frequency    Min 5X/week      PT Plan Current plan remains appropriate    Co-evaluation PT/OT/SLP Co-Evaluation/Treatment: Yes Reason for Co-Treatment: For patient/therapist safety;To address functional/ADL transfers PT goals addressed during session: Mobility/safety with mobility;Balance;Strengthening/ROM        AM-PAC PT "6 Clicks" Mobility   Outcome Measure  Help needed turning from your back to your side while in a flat bed without using bedrails?: A Lot Help needed moving from lying on your back to sitting on the side of a flat bed without using bedrails?: A Lot Help needed moving to and from a bed to a chair (including a wheelchair)?: A Lot Help needed standing up from a chair using your arms (e.g., wheelchair or bedside chair)?: A Lot Help needed to walk in hospital room?: Total Help needed climbing 3-5 steps with a railing? : Total 6 Click Score: 10    End of Session Equipment Utilized During Treatment: Back brace Activity Tolerance: Patient tolerated treatment well Patient left: in chair;with call bell/phone within reach;with chair alarm set Nurse  Communication: Mobility status PT Visit Diagnosis: Other abnormalities of gait and mobility (R26.89);Muscle weakness (generalized) (M62.81)     Time: 1540-0867 PT Time Calculation (min) (ACUTE ONLY): 25 min  Charges:  $Therapeutic Activity: 8-22 mins                     Stacie Glaze, PT DPT Acute Rehabilitation Services Pager [phone]  Office [phone]    Hubbell E Ruffin Pyo 10/09/2021, 1:11 PM

## 2021-10-09 NOTE — Progress Notes (Signed)
Subjective: Patient reports  patient overall denies any pain in his arms or his legs says he has had difficulty talking  Objective: Vital signs in last 24 hours: Temp:  [99.9 F (37.7 C)-102.6 F (39.2 C)] 99.9 F (37.7 C) (11/28 0314) Pulse Rate:  [55-166] 77 (11/28 0515) Resp:  [14-23] 17 (11/28 0515) BP: (80-148)/(48-123) 115/64 (11/28 0515) SpO2:  [87 %-100 %] 100 % (11/28 0515)  Intake/Output from previous day: 11/27 0701 - 11/28 0700 In: 1769.5 [P.O.:1060; I.V.:209.5; IV Piggyback:500] Out: 2300 [Urine:2300] Intake/Output this shift: No intake/output data recorded.  Awake alert currently febrile to 102.6 heart rate 77 but is in atrial fibrillation blood pressure stable 115/64 patient is currently on amiodarone incision is clean dry and intact  Lab Results: Recent Labs    10/09/21 0226  WBC 8.3  HGB 6.1*  HCT 18.2*  PLT 262   BMET Recent Labs    10/09/21 0226  NA 133*  K 3.9  CL 95*  CO2 30  GLUCOSE 132*  BUN 47*  CREATININE 2.31*  CALCIUM 8.0*    Studies/Results: DG Chest Port 1 View  Result Date: 10/07/2021 CLINICAL DATA:  Fever EXAM: PORTABLE CHEST 1 VIEW COMPARISON:  02/22/2020 and prior studies FINDINGS: UPPER limits normal heart size noted. Mild peribronchial thickening is unchanged. There is no evidence of focal airspace disease, pulmonary edema, suspicious pulmonary nodule/mass, pleural effusion, or pneumothorax. No acute bony abnormalities are identified. IMPRESSION: No active disease. Electronically Signed   By: Margarette Canada M.D.   On: 10/07/2021 11:27    Assessment/Plan: Postop day 7 posterior lumbar fusion with extension started running fevers and went into A. fib with RVR.  Appreciate cardiology's help medicines been contacted fever work-up in progress for chest x-ray had cultures already taken at 1 had started on Levaquin certainly welcome medicines recommendations especially if we want to change around his antibiotics but I do think he needs to  be covered.  LOS: 7 days     Elaina Hoops 10/09/2021, 7:58 AM

## 2021-10-09 NOTE — Progress Notes (Addendum)
Received critical hemoglobin of 6.1; pt jehovah witness and has signed blood product refusal form this admission.  Paged Margo Aye, NP for orders.

## 2021-10-09 NOTE — Significant Event (Signed)
Rapid Response Event Note   Reason for Call :  HR-150s and EKG reading Afib RVR, STEMI.  Per RN, pt denies chest pain and appears to be in no distress. Pt has been getting progressively confused t/o night. His BP is 95/67. I asked RN to start 500cc NS bolus and to page PCP.  0045-5mg  lopressor given IV per MD order, HR still 150s. I asked RN to page cards MD to review EKG.    Initial Focused Assessment:  Pt lying in bed with eyes open, in no visible distress. Pt has been progressively confused t/o shift. He denies chest pain/SOB. He is joking with staff. Lungs clear t/o. Skin warm and dry.  T-102.6, HR-150, BP-101/87, RR-18, SpO2-99% on 2L [place].  Interventions:  EKG-Afib RVR, R BBB, STE 500cc NS bolus 5mg  lopressor IV x 2 Tylenol Cardiology consulted: 150mg  Amiod bolus, 60mg /hr IV x 6 hr, then 30mg /hr Septic workup Plan of Care:  Amiod bolus given and gtt started. Monitor response. Lab at bedside to draw labs-await results. Continue to monitor pt closley. Call RRT if further assistance needed.    Event Summary:   MD Notified: Shelba Flake, NP notified by bedside RN, Dr. Narcisse(Cards MD) consulted and came to beside. Call Polk, Tyshia Fenter Anderson, RN

## 2021-10-10 ENCOUNTER — Other Ambulatory Visit (HOSPITAL_COMMUNITY): Payer: No Typology Code available for payment source

## 2021-10-10 LAB — URINE CULTURE: Culture: NO GROWTH

## 2021-10-10 LAB — CBC
HCT: 18.4 % — ABNORMAL LOW (ref 39.0–52.0)
Hemoglobin: 6.1 g/dL — CL (ref 13.0–17.0)
MCH: 31.6 pg (ref 26.0–34.0)
MCHC: 33.2 g/dL (ref 30.0–36.0)
MCV: 95.3 fL (ref 80.0–100.0)
Platelets: 271 10*3/uL (ref 150–400)
RBC: 1.93 MIL/uL — ABNORMAL LOW (ref 4.22–5.81)
RDW: 13.4 % (ref 11.5–15.5)
WBC: 8.1 10*3/uL (ref 4.0–10.5)
nRBC: 0.2 % (ref 0.0–0.2)

## 2021-10-10 LAB — IRON AND TIBC
Iron: 21 ug/dL — ABNORMAL LOW (ref 45–182)
Saturation Ratios: 10 % — ABNORMAL LOW (ref 17.9–39.5)
TIBC: 203 ug/dL — ABNORMAL LOW (ref 250–450)
UIBC: 182 ug/dL

## 2021-10-10 LAB — VITAMIN B12: Vitamin B-12: 175 pg/mL — ABNORMAL LOW (ref 180–914)

## 2021-10-10 LAB — BASIC METABOLIC PANEL
Anion gap: 9 (ref 5–15)
BUN: 45 mg/dL — ABNORMAL HIGH (ref 8–23)
CO2: 27 mmol/L (ref 22–32)
Calcium: 8 mg/dL — ABNORMAL LOW (ref 8.9–10.3)
Chloride: 98 mmol/L (ref 98–111)
Creatinine, Ser: 2.08 mg/dL — ABNORMAL HIGH (ref 0.61–1.24)
GFR, Estimated: 32 mL/min — ABNORMAL LOW (ref >60)
Glucose, Bld: 122 mg/dL — ABNORMAL HIGH (ref 70–99)
Potassium: 3.7 mmol/L (ref 3.5–5.1)
Sodium: 134 mmol/L — ABNORMAL LOW (ref 135–145)

## 2021-10-10 LAB — RETICULOCYTES
Immature Retic Fract: 18.8 % — ABNORMAL HIGH (ref 2.3–15.9)
RBC.: 1.96 MIL/uL — ABNORMAL LOW (ref 4.22–5.81)
Retic Count, Absolute: 24.5 10*3/uL (ref 19.0–186.0)
Retic Ct Pct: 1.3 % (ref 0.4–3.1)

## 2021-10-10 LAB — FERRITIN: Ferritin: 167 ng/mL (ref 24–336)

## 2021-10-10 LAB — FOLATE: Folate: 10 ng/mL (ref >5.9)

## 2021-10-10 MED ORDER — SODIUM CHLORIDE 0.9 % IV SOLN
250.0000 mg | Freq: Every day | INTRAVENOUS | Status: AC
  Administered 2021-10-10 – 2021-10-13 (×4): 250 mg via INTRAVENOUS
  Filled 2021-10-10 (×4): qty 20

## 2021-10-10 NOTE — Progress Notes (Addendum)
PROGRESS NOTE    Bryan [name]  MOL:[id number] DOB: 09-19-1946 DOA: 10/02/2021 PCP: [name], FNP  Brief Narrative: 74/M with history of marginal zone B-cell lymphoma, BPH, OSA, hypertension, CAD, diabetes mellitus, obesity, Jehovah's Witness underwent lumbar fusion with removal of hardware of L4-S1 on 11/21.  Hospital course was complicated by A. fib RVR, confusion, fevers and anemia-Hb 6.1 in the last 2 days.  -TRH was consulted 11/28 for evaluation of fevers, confusion   Assessment & Plan:   Postop fever  -Appears to be subsiding -Source not clear, could be atelectasis, also has a Foley catheter, will remove this, urinalysis yesterday appears unremarkable, blood cultures negative so far, urine culture 11/27 is negative -CT chest with chronic findings only, otherwise unremarkable -Wound exam is unremarkable, no drainage, surrounding erythema tenderness or warmth -Continue Levaquin for 5 to 7 days -If fevers recur, may need MRI LS spine, clinical suspicion for infection seems to be low at this time  Atrial fibrillation/flutter -Seen by cardiology in consultation yesterday, back in sinus rhythm now -Remains on Toprol and and IV amiodarone -Hold off on anticoagulation in the setting of severe anemia  S/p lumbar fusion 11/21 -Per Dr. Saintclair Halsted, PT OT  BPH, obstructive uropathy -UA unremarkable, remove/change Foley catheter in the setting of postop fevers -Continue Flomax  AKI on CKD 3 A -Holding HCTZ, continue IV fluids 1 more day, creatinine improving -Baseline creatinine around 1.3  Postop anemia -Hemoglobin dropped to 6.1 from 11/12 range on admission, worsened by hemodilution -Patient is a Jehovah's Witness, adamantly declines blood products -Check anemia panel, may benefit from IV iron/epo  History of RA -Continue Plaquenil, also on Rituxan as outpatient  History of B-cell lymphoma -Reportedly in remission per last oncology note  DVT prophylaxis: SCDs      Procedures: Lumbar fusion 11/21  Antimicrobials:    Subjective: -Feels okay, denies any specific complaints  Objective: Vitals:   10/09/21 1925 10/09/21 2321 10/10/21 0420 10/10/21 0905  BP:  111/62 126/62 (!) 142/55  Pulse:  88 96 (!) 104  Resp:  (!) 21 (!) 21 20  Temp: 99.8 F (37.7 C) 100.3 F (37.9 C) 98.9 F (37.2 C)   TempSrc: Oral Oral Oral   SpO2: 100% 100% 100%   Weight:      Height:        Intake/Output Summary (Last 24 hours) at 10/10/2021 1051 Last data filed at 10/10/2021 2010 Gross per 24 hour  Intake 363 ml  Output 2500 ml  Net -2137 ml   Filed Weights   10/02/21 1041  Weight: 101.6 kg    Examination:  General exam: Elderly chronically ill male sitting up in bed, AAOx3, no distress CVS: S1-S2, regular rate rhythm Lungs: Decreased breath sounds to bases otherwise clear Abdomen: Soft, obese, nontender, bowel sounds present Extremities: No edema Skin: No rashes on exposed skin, low back with surgical dressing, no warmth tenderness drainage etc.  Psychiatry:  Mood & affect appropriate.     Data Reviewed:   CBC: Recent Labs  Lab 10/03/21 1057 10/05/21 1800 10/09/21 0226 10/10/21 0318  WBC 10.3 11.1* 8.3 8.1  NEUTROABS 8.1*  --  6.3  --   HGB 8.8* 7.3* 6.1* 6.1*  HCT 27.0* 22.8* 18.2* 18.4*  MCV 97.1 97.9 94.3 95.3  PLT 187 202 262 071   Basic Metabolic Panel: Recent Labs  Lab 10/03/21 1057 10/09/21 0226 10/10/21 0318  NA 135 133* 134*  K 4.0 3.9 3.7  CL 99 95* 98  CO2 27 30 27   GLUCOSE 139* 132* 122*  BUN 36* 47* 45*  CREATININE 2.72* 2.31* 2.08*  CALCIUM 7.6* 8.0* 8.0*   GFR: Estimated Creatinine Clearance: 33.5 mL/min (A) (by C-G formula based on SCr of 2.08 mg/dL (H)). Liver Function Tests: Recent Labs  Lab 10/09/21 0226  AST 88*  ALT 47*  ALKPHOS 179*  BILITOT 0.6  PROT 6.0*  ALBUMIN 2.0*   No results for input(s): LIPASE, AMYLASE in the last 168 hours. No results for input(s): AMMONIA in the last 168  hours. Coagulation Profile: No results for input(s): INR, PROTIME in the last 168 hours. Cardiac Enzymes: No results for input(s): CKTOTAL, CKMB, CKMBINDEX, TROPONINI in the last 168 hours. BNP (last 3 results) No results for input(s): PROBNP in the last 8760 hours. HbA1C: No results for input(s): HGBA1C in the last 72 hours. CBG: Recent Labs  Lab 10/09/21 0038  GLUCAP 114*   Lipid Profile: No results for input(s): CHOL, HDL, LDLCALC, TRIG, CHOLHDL, LDLDIRECT in the last 72 hours. Thyroid Function Tests: No results for input(s): TSH, T4TOTAL, FREET4, T3FREE, THYROIDAB in the last 72 hours. Anemia Panel: Recent Labs    10/10/21 0920  RETICCTPCT 1.3   Urine analysis:    Component Value Date/Time   COLORURINE YELLOW 10/09/2021 1047   APPEARANCEUR HAZY (A) 10/09/2021 1047   LABSPEC 1.011 10/09/2021 1047   PHURINE 5.0 10/09/2021 1047   GLUCOSEU NEGATIVE 10/09/2021 1047   HGBUR MODERATE (A) 10/09/2021 1047   BILIRUBINUR NEGATIVE 10/09/2021 1047   BILIRUBINUR negative 05/09/2018 1809   BILIRUBINUR neg 07/29/2013 0953   KETONESUR NEGATIVE 10/09/2021 1047   PROTEINUR NEGATIVE 10/09/2021 1047   UROBILINOGEN 0.2 05/09/2018 1809   UROBILINOGEN 0.2 02/08/2015 0815   NITRITE NEGATIVE 10/09/2021 1047   LEUKOCYTESUR NEGATIVE 10/09/2021 1047   Sepsis Labs: @LABRCNTIP (procalcitonin:4,lacticidven:4)  ) Recent Results (from the past 240 hour(s))  Urine Culture     Status: None   Collection Time: 10/08/21 10:15 AM   Specimen: Urine, Catheterized  Result Value Ref Range Status   Specimen Description URINE, CATHETERIZED  Final   Special Requests NONE  Final   Culture   Final    NO GROWTH Performed at [facility], [place] [address], [place], [place] [address]    Report Status 10/09/2021 FINAL  Final  Culture, blood (routine x 2)     Status: None (Preliminary result)   Collection Time: 10/09/21  2:26 AM   Specimen: BLOOD  Result Value Ref Range Status   Specimen Description  BLOOD LEFT ANTECUBITAL  Final   Special Requests AEROBIC BOTTLE ONLY Blood Culture adequate volume  Final   Culture   Final    NO GROWTH 1 DAY Performed at [facility], [place] [address] [place], [place] [address]    Report Status PENDING  Incomplete  Culture, blood (routine x 2)     Status: None (Preliminary result)   Collection Time: 10/09/21  2:26 AM   Specimen: BLOOD  Result Value Ref Range Status   Specimen Description BLOOD SITE NOT SPECIFIED  Final   Special Requests AEROBIC BOTTLE ONLY Blood Culture adequate volume  Final   Culture   Final    NO GROWTH 1 DAY Performed at [facility], [place] [address] [place], [place] [address]    Report Status PENDING  Incomplete         Radiology Studies: DG Chest 1 View  Result Date: 10/09/2021 CLINICAL DATA:  Fever today, history diabetes mellitus,  hypertension, past history lymphoma EXAM: CHEST  1 VIEW COMPARISON:  Portable exam 0830 hours compared to 10/07/2021 and 02/22/2020. FINDINGS: Normal heart size and pulmonary vascularity. Enlargement of hila especially of LEFT hilum increased since 02/22/2020 Lungs clear. No definite infiltrate, pleural effusion, or pneumothorax. Prior cervical fusion. IMPRESSION: Enlargement of pulmonary hila especially LEFT hilum, cannot exclude adenopathy; CT chest with contrast recommended for further assessment. Electronically Signed   By: Lavonia Dana M.D.   On: 10/09/2021 08:41   CT CHEST WO CONTRAST  Result Date: 10/09/2021 CLINICAL DATA:  Sepsis EXAM: CT CHEST WITHOUT CONTRAST TECHNIQUE: Multidetector CT imaging of the chest was performed following the standard protocol without IV contrast. COMPARISON:  Chest x-ray 10/09/2021, CT chest 02/22/2020 FINDINGS: Cardiovascular: Limited evaluation without intravenous contrast. Mild atherosclerosis. No aneurysm. Common origin of right brachiocephalic and left common carotid artery. Cardiac size within normal limits. No significant pericardial  effusion. Mediastinum/Nodes: Midline trachea. No thyroid mass. No suspicious adenopathy. Note that assessment for hilar nodes is limited without contrast. Esophagus within normal limits Lungs/Pleura: Trace left-sided pleural effusion. Mild bronchiectasis and distortion in the posterior right upper lobe with surrounding ground-glass density consistent with scarring. No acute airspace disease or pneumothorax. Mild right lower lobe bronchiectasis as well with scarring. Upper Abdomen: No acute abnormality. Musculoskeletal: Chronic fracture deformity of the upper sternal body. No acute osseous abnormality IMPRESSION: 1. Negative for acute airspace disease. Areas of chronic scarring in the right upper and lower lobes. 2. Small left-sided pleural effusion. Aortic Atherosclerosis (ICD10-I70.0). Electronically Signed   By: Donavan Foil M.D.   On: 10/09/2021 22:56        Scheduled Meds:  atorvastatin  20 mg Oral Daily   Chlorhexidine Gluconate Cloth  6 each Topical Daily   gabapentin  600 mg Oral BID   iron polysaccharides  150 mg Oral Daily   levofloxacin  250 mg Oral Daily   metoprolol succinate  100 mg Oral Daily   pantoprazole  40 mg Oral QHS   primidone  150 mg Oral QHS   sodium chloride flush  3 mL Intravenous Q12H   tamsulosin  0.4 mg Oral QHS   Continuous Infusions:  sodium chloride 250 mL (10/03/21 0533)   sodium chloride 75 mL/hr at 10/10/21 0243   amiodarone 30 mg/hr (10/09/21 0800)   sodium chloride       LOS: 8 days    Time spent: 74min    Domenic Polite, MD [facility]   10/10/2021, 10:51 AM

## 2021-10-10 NOTE — Progress Notes (Signed)
Occupational Therapy Treatment Patient Details Name: Bryan [name] MRN: [id number] DOB: 24-Feb-1946 Today's Date: 10/10/2021   History of present illness 75 yo male s/p L2-4 decompression and fusion, exploration of L4-S1 fusion, redo L5-S1 fusion on 11/22. Developed tachycardia up to 150s on 11/27, cardiology following. PMH includes OA, BPH, cancer, DM, obesity, RA, seizures, ACDF 2014, lap chole, lumbar surgeries 2016-2017.   OT comments  Pt progressing toward goals this session with extra encouragement from therapists due to fatigue from incontinence. Pt with very loose stool throughout session and pt reports inability to control bowels. Pt aware of incontinence but had not called staff to help. Pt states "it just keeps coming with everything I do" Recommendation SNF at this time.    Recommendations for follow up therapy are one component of a multi-disciplinary discharge planning process, led by the attending physician.  Recommendations may be updated based on patient status, additional functional criteria and insurance authorization.    Follow Up Recommendations  Skilled nursing-short term rehab (<3 hours/day)    Assistance Recommended at Discharge Intermittent Supervision/Assistance  Equipment Recommendations  BSC/3in1;Other (comment) (RW)    Recommendations for Other Services      Precautions / Restrictions Precautions Precautions: Fall;Back Precaution Comments: poor recall of back precautions Required Braces or Orthoses: Spinal Brace Spinal Brace: Lumbar corset Restrictions Weight Bearing Restrictions: No       Mobility Bed Mobility Overal bed mobility: Needs Assistance Bed Mobility: Rolling;Supine to Sit Rolling: Max assist   Supine to sit: +2 for physical assistance;+2 for safety/equipment;Max assist;HOB elevated     General bed mobility comments: pt needs max cues and (A) to elevate trunk from surface    Transfers Overall transfer level: Needs  assistance Equipment used: Standard walker Transfers: Sit to/from Stand Sit to Stand: +2 physical assistance;Mod assist;+2 safety/equipment;From elevated surface           General transfer comment: pt needs cues for hand placement and safety     Balance Overall balance assessment: Needs assistance         Standing balance support: Bilateral upper extremity supported;During functional activity;Reliant on assistive device for balance                               ADL either performed or assessed with clinical judgement   ADL Overall ADL's : Needs assistance/impaired Eating/Feeding: Minimal assistance               Upper Body Dressing : Maximal assistance Upper Body Dressing Details (indicate cue type and reason): total (A) to don brace and tighten. pt with poor balance at EOB Lower Body Dressing: Maximal assistance Lower Body Dressing Details (indicate cue type and reason): noted to have edema at ankles with socks Toilet Transfer: +2 for safety/equipment;+2 for physical assistance;Moderate assistance;BSC/3in1   Toileting- Clothing Manipulation and Hygiene: Total assistance Toileting - Clothing Manipulation Details (indicate cue type and reason): pt with very loose stool on arrival and once up in 3n1. pt with soft stool that is unable to be controlled. brief placed on patient to allow him to progress with some mobility.     Functional mobility during ADLs: Moderate assistance;Rolling walker (2 wheels) General ADL Comments: pt needs encouragement to keep progressing during session. pt reports fatigue after mulitple needs for hygiene and progressing to room door    Extremity/Trunk Assessment Upper Extremity Assessment Upper Extremity Assessment: Generalized weakness   Lower Extremity Assessment Lower Extremity Assessment: Generalized  weakness        Vision       Perception     Praxis      Cognition Arousal/Alertness: Awake/alert Behavior During  Therapy: Flat affect Overall Cognitive Status: Impaired/Different from baseline Area of Impairment: Safety/judgement;Memory;Attention;Following commands;Problem solving                   Current Attention Level: Sustained Memory: Decreased recall of precautions Following Commands: Follows one step commands inconsistently Safety/Judgement: Decreased awareness of deficits   Problem Solving: Slow processing;Decreased initiation;Difficulty sequencing;Requires verbal cues;Requires tactile cues General Comments: pt more alert and more oriented then previous session. pt very internally fixated on need to void bowels. pt reports "i can't stop it it just comes "          Exercises     Shoulder Instructions       General Comments VSS SBP 120s sustain during session    Pertinent Vitals/ Pain       Pain Assessment: Faces Faces Pain Scale: Hurts little more Pain Location: back Pain Descriptors / Indicators: Discomfort;Grimacing;Sore Pain Intervention(s): Monitored during session;Premedicated before session;Repositioned  Home Living                                          Prior Functioning/Environment              Frequency  Min 2X/week        Progress Toward Goals  OT Goals(current goals can now be found in the care plan section)  Progress towards OT goals: Progressing toward goals  Acute Rehab OT Goals Patient Stated Goal: to stop pooping Time For Goal Achievement: 10/17/21 Potential to Achieve Goals: Good ADL Goals Pt Will Perform Grooming: with min guard assist;sitting Pt Will Perform Lower Body Dressing: with supervision;with adaptive equipment;sit to/from stand Pt Will Transfer to Toilet: with min guard assist;bedside commode;ambulating Additional ADL Goal #1: pt will complete bed mobility supervision level for adls.  Plan Discharge plan remains appropriate    Co-evaluation    PT/OT/SLP Co-Evaluation/Treatment: Yes Reason for  Co-Treatment: Complexity of the patient's impairments (multi-system involvement);For patient/therapist safety;Necessary to address cognition/behavior during functional activity;To address functional/ADL transfers   OT goals addressed during session: ADL's and self-care;Proper use of Adaptive equipment and DME;Strengthening/ROM      AM-PAC OT "6 Clicks" Daily Activity     Outcome Measure   Help from another person eating meals?: A Little Help from another person taking care of personal grooming?: A Little Help from another person toileting, which includes using toliet, bedpan, or urinal?: A Lot Help from another person bathing (including washing, rinsing, drying)?: A Lot Help from another person to put on and taking off regular upper body clothing?: A Little Help from another person to put on and taking off regular lower body clothing?: A Lot 6 Click Score: 15    End of Session Equipment Utilized During Treatment: Gait belt;Rolling walker (2 wheels);Back brace  OT Visit Diagnosis: Unsteadiness on feet (R26.81);Muscle weakness (generalized) (M62.81);Pain   Activity Tolerance Patient tolerated treatment well   Patient Left in chair;with call bell/phone within reach;with chair alarm set;with nursing/sitter in room (tech Beverlee Nims in room with patient)   Nurse Communication Mobility status;Precautions        Time: 9629-5284 OT Time Calculation (min): 32 min  Charges: OT General Charges $OT Visit: 1 Visit OT Treatments $Self Care/Home Management :  8-22 mins   Fleeta Emmer, OTR/L  Acute Rehabilitation Services Pager: [phone] Office: [phone] .   Jeri Modena 10/10/2021, 12:08 PM

## 2021-10-10 NOTE — Progress Notes (Signed)
Patient declined CPAP use. States he wears it at home but "its not the same at the hospital". States he will wear oxygen. Patient currently on 2L [place]. Vitals stable. Patient aware to call for Respiratory if he would like to use CPAP. Equipment removed from room at this time.

## 2021-10-10 NOTE — Progress Notes (Addendum)
20:13  He is complaining of mid stabbing chest pain that radiates to his right breast 10/10 pain at first then stated it was a 6/10 after I asked if it was the worst pain he has ever felt, he says that it comes when he eats and gets relief when he is resting. He was grimacing in pain while I was assessing him. He wants to go home and keeps telling me to "let the doctors know I'm having a good day but I'm having this chest pain and am not sure what to do about it" They did an EKG this morning , it is still showing a critical alert STEMI but this is due to a bundle block. BP is 150/53 map of 77, temp is 100.4, RR 27  20:18  Per Dr. Olena Heckle let neuro know they are primary, recommended Maalox; maalox administered and neuro paged  20:38  Dr. Marcello Moores (on call neurosurgery) returned page notified me that I should page hospitalist, "we do not do this" and ended conversation

## 2021-10-10 NOTE — Progress Notes (Addendum)
Subjective:  Bryan [name] is a 75 year old AA male with history of  marginal zone lymphoma, rheumatoid arthritis, coronary artery disease, and hypertension who underwent lumbar fusion 10/02/2021 by Dr. Saintclair Halsted.  Patient developed postoperative fever and anemia.  Cardiology was subsequently consulted as patient developed SVT with heart rates in the 150s beats per minute on 10/08/2021.  Patient is followed in our office, however he was seen by [facility] provider, Dr. Marcelle Smiling, yesterday in consultation.  Patient's EKG/telemetry noted atrial fibrillation versus flutter, he was therefore started on amiodarone infusion.  [facility] now take over cardiology care.  Patient is presently being evaluated by hospitalist team regarding postoperative fever and anemia. Patient is presently on Levaquin.  Notably patient's hemoglobin dropped to 6.1 from 11-12 range on admission.  Patient is seen and examined at bedside this morning at approximately 8:00 AM.  Patient is feeling relatively well overall although he reports fatigue.  Patient denies chest pain, palpitations.  Review of telemetry noted that patient converted to normal sinus rhythm on 10/09/2021 at approximately 2 PM.  At the time of exam patient is in sinus rhythm at approximately 100 bpm.  Amiodarone infusion was discontinued yesterday.  Intake/Output from previous day:  I/O last 3 completed shifts: In: 2129.5 [P.O.:1420; I.V.:209.5; IV Piggyback:500] Out: 4701 [Urine:4700; Stool:1] No intake/output data recorded.  Blood pressure 126/62, pulse 96, temperature 98.9 F (37.2 C), temperature source Oral, resp. rate (!) 21, height 5' 5.5" (1.664 m), weight 101.6 kg, SpO2 100 %. Physical Exam Vitals reviewed.  HENT:     Head: Normocephalic and atraumatic.  Cardiovascular:     Rate and Rhythm: Regular rhythm. Tachycardia present. Occasional Extrasystoles are present.    Pulses: Intact distal pulses.     Heart sounds: S1 normal and S2 normal. No  murmur heard.   No gallop.  Pulmonary:     Effort: Pulmonary effort is normal. No respiratory distress.     Breath sounds: Wheezing (bliaterally) present. No rhonchi or rales.  Musculoskeletal:     Right lower leg: No edema.     Left lower leg: No edema.  Neurological:     Mental Status: He is alert.     Comments: Oriented x3, however patient seems mildly confused repeating phrases not related to line of questioning.    Lab Results: BMP BNP (last 3 results) No results for input(s): BNP in the last 8760 hours.  ProBNP (last 3 results) No results for input(s): PROBNP in the last 8760 hours. BMP Latest Ref Rng & Units 10/10/2021 10/09/2021 10/03/2021  Glucose 70 - 99 mg/dL 122(H) 132(H) 139(H)  BUN 8 - 23 mg/dL 45(H) 47(H) 36(H)  Creatinine 0.61 - 1.24 mg/dL 2.08(H) 2.31(H) 2.72(H)  Sodium 135 - 145 mmol/L 134(L) 133(L) 135  Potassium 3.5 - 5.1 mmol/L 3.7 3.9 4.0  Chloride 98 - 111 mmol/L 98 95(L) 99  CO2 22 - 32 mmol/L 27 30 27   Calcium 8.9 - 10.3 mg/dL 8.0(L) 8.0(L) 7.6(L)   Hepatic Function Latest Ref Rng & Units 10/09/2021 09/27/2021 09/26/2021  Total Protein 6.5 - 8.1 g/dL 6.0(L) 6.7 7.5  Albumin 3.5 - 5.0 g/dL 2.0(L) 3.3(L) 3.5  AST 15 - 41 U/L 88(H) 25 19  ALT 0 - 44 U/L 47(H) 19 16  Alk Phosphatase 38 - 126 U/L 179(H) 164(H) 184(H)  Total Bilirubin 0.3 - 1.2 mg/dL 0.6 0.9 <0.2(L)   CBC Latest Ref Rng & Units 10/10/2021 10/09/2021 10/05/2021  WBC 4.0 - 10.5 K/uL 8.1 8.3 11.1(H)  Hemoglobin 13.0 -  17.0 g/dL 6.1(LL) 6.1(LL) 7.3(L)  Hematocrit 39.0 - 52.0 % 18.4(L) 18.2(L) 22.8(L)  Platelets 150 - 400 K/uL 271 262 202   Lipid Panel     Component Value Date/Time   CHOL 168 02/01/2017 0845   TRIG 65 02/01/2017 0845   HDL 69 02/01/2017 0845   CHOLHDL 2.4 02/01/2017 0845   CHOLHDL 3.6 05/12/2014 0926   VLDL 19 05/12/2014 0926   LDLCALC 86 02/01/2017 0845   Cardiac Panel (last 3 results) No results for input(s): CKTOTAL, CKMB, TROPONINI, RELINDX in the last 72  hours.  HEMOGLOBIN A1C Lab Results  Component Value Date   HGBA1C 5.5 10/31/2018   TSH No results for input(s): TSH in the last 8760 hours. Imaging: DG Chest 10/09/2021 Enlargement of pulmonary hila especially LEFT hilum, cannot exclude adenopathy; CT chest with contrast recommended for further assessment.   CT CHEST WO CONTRAST 10/09/2021 Cardiovascular: Limited evaluation without intravenous contrast. Mild atherosclerosis. No aneurysm. Common origin of right brachiocephalic and left common carotid artery. Cardiac size within normal limits. No significant pericardial effusion.  Mediastinum/Nodes: Midline trachea. No thyroid mass. No suspicious adenopathy. Note that assessment for hilar nodes is limited without contrast. Esophagus within normal limits  Lungs/Pleura: Trace left-sided pleural effusion. Mild bronchiectasis and distortion in the posterior right upper lobe with surrounding ground-glass density consistent with scarring. No acute airspace disease or pneumothorax. Mild right lower lobe bronchiectasis as well with scarring.  Upper Abdomen: No acute abnormality.  Musculoskeletal: Chronic fracture deformity of the upper sternal body. No acute osseous abnormality  IMPRESSION:  1. Negative for acute airspace disease. Areas of chronic scarring in the right upper and lower lobes.  2. Small left-sided pleural effusion.  Aortic Atherosclerosis  Cardiac Studies: Event monitor 04/28/2020 - 05/11/2020: Diagnostic time: 74%  Dominant rhythm: Sinus. HR 52-109 bpm. Avg HR 68 bpm. 5 beat NSVT at 2:44 am CT on 04/28/2020.  Recurring episodes of atrial tachycardia <30 sec on 05/06/2020 10:14 AM CT No atrial fibrillation/atrial flutter/SVT/VT/high grade AV block, sinus pause >3sec noted. Symptoms reported: None   Lexiscan Tetrofosmin stress test 04/13/2020: Lexiscan/modified Bruce nuclear stress test performed using 1-day protocol. Stress EKG is non-diagnostic, as this is pharmacological stress  test. In addition, stress EKG at 67% MPHR showed sinus tachycardia, RBBB, LAFB.  SPECT images show small sized, mild intensity, reversible perfusion defect in apical anteroseptal myocardium. Stress LVEF 42%. Intermediate risk study.   Coronary CT angiogram 07/2018: 1. Coronary calcium score of 21. This was 50 percentile for age and sex matched control. 2. Normal coronary origin with right dominance. 3. There is mild to moderate CAD in the proximal and mid LAD. Aggressive risk factor modification is recommended.  EKG: 10/09/2021: Atrial flutter with variable conduction at a rate of 115 bpm. 10/10/2021: Sinus rhythm with single PAC at a rate of 100 bpm.  Normal axis.  Right bundle branch block.  No STEMI. Dr. Einar Gip personally reviewed EKG and agrees.   Telemetry:  Intermittent atrial fibrillation/flutter.  Converted to sinus rhythm approximately 2:00 PM 10/09/2021.   Medications:  Scheduled Meds:  atorvastatin  20 mg Oral Daily   Chlorhexidine Gluconate Cloth  6 each Topical Daily   gabapentin  600 mg Oral BID   iron polysaccharides  150 mg Oral Daily   levofloxacin  250 mg Oral Daily   metoprolol succinate  100 mg Oral Daily   pantoprazole  40 mg Oral QHS   primidone  150 mg Oral QHS   sodium chloride flush  3 mL  Intravenous Q12H   tamsulosin  0.4 mg Oral QHS   Continuous Infusions:  sodium chloride 250 mL (10/03/21 0533)   sodium chloride 75 mL/hr at 10/10/21 0243   amiodarone 30 mg/hr (10/09/21 0800)   sodium chloride     PRN Meds:.acetaminophen **OR** acetaminophen, alum & mag hydroxide-simeth, cyclobenzaprine, hydrALAZINE, HYDROmorphone (DILAUDID) injection, menthol-cetylpyridinium **OR** phenol, nitroGLYCERIN, ondansetron **OR** ondansetron (ZOFRAN) IV, oxyCODONE, sodium chloride, sodium chloride flush, traMADol-acetaminophen  Assessment/Plan:  Paroxysmal atrial fibrillation/flutter:  This patients CHA2DS2-VASc Score 4 (HTN, Vasc, age) and yearly risk of stroke  4.8%. Patient now converted to sinus rhythm.   Amiodarone infusion has been discontinued.   We will continue metoprolol succinate 100 mg p.o. once daily.   Given significant anemia patient is not a candidate for anticoagulation at this time despite CHA2DS2-VASc score of 4.   Reviewed and discussed with patient regarding risks versus alternatives and benefits of oral anticoagulation, he is in agreement of holding off anticoagulation at this time. Will obtain echocardiogram  Coronary artery disease:  Continue statin and beta-blocker therapy. Will continue to follow outpatient  No aspirin at this time due to anemia and consideration for anticoagulation in the setting of atrial fibrillation if hemoglobin improves.   Hypertension:  Agree with holding HCTZ given underlying AKI Relatively well controlled  Agree with PRN hydralazine per medicine team  Postoperative fever:  Appears to be improving.  Course unclear according to medicine team  Presently on Levaquin  Will defer further management to primary team   Anemia:  Baseline Hgb about 12 prior to admission  Presently Hgb 6.1  Given anemia patient is not a candidate for anticoagulation at this time  Continue to monitor  Notably patient is Jehovah's Witness, therefore declines blood products.  AKI on CKD 3A:  Agree with holding home HCTZ. Will hold off on ARB or ACE for underlying CAD.    Patient was seen in collaboration with Dr. Virgina Jock. He also reviewed patient's chart and examined the patient. Dr. Virgina Jock is in agreement of the plan.    Alethia Berthold, PA-C 10/10/2021, 11:37 AM Office: [phone]

## 2021-10-10 NOTE — Progress Notes (Signed)
Subjective: Patient reports  feeling good this a.m. minimal back and leg pain  Objective: Vital signs in last 24 hours: Temp:  [98.4 F (36.9 C)-100.8 F (38.2 C)] 98.9 F (37.2 C) (11/29 0420) Pulse Rate:  [88-98] 96 (11/29 0420) Resp:  [21-22] 21 (11/29 0420) BP: (111-143)/(57-81) 126/62 (11/29 0420) SpO2:  [100 %] 100 % (11/29 0420)  Intake/Output from previous day: 11/28 0701 - 11/29 0700 In: 720 [P.O.:720] Out: 3151 [Urine:3150; Stool:1] Intake/Output this shift: No intake/output data recorded.  Awake alert strength 5 out of 5  Lab Results: Recent Labs    10/09/21 0226 10/10/21 0318  WBC 8.3 8.1  HGB 6.1* 6.1*  HCT 18.2* 18.4*  PLT 262 271   BMET Recent Labs    10/09/21 0226 10/10/21 0318  NA 133* 134*  K 3.9 3.7  CL 95* 98  CO2 30 27  GLUCOSE 132* 122*  BUN 47* 45*  CREATININE 2.31* 2.08*  CALCIUM 8.0* 8.0*    Studies/Results: DG Chest 1 View  Result Date: 10/09/2021 CLINICAL DATA:  Fever today, history diabetes mellitus, hypertension, past history lymphoma EXAM: CHEST  1 VIEW COMPARISON:  Portable exam 0830 hours compared to 10/07/2021 and 02/22/2020. FINDINGS: Normal heart size and pulmonary vascularity. Enlargement of hila especially of LEFT hilum increased since 02/22/2020 Lungs clear. No definite infiltrate, pleural effusion, or pneumothorax. Prior cervical fusion. IMPRESSION: Enlargement of pulmonary hila especially LEFT hilum, cannot exclude adenopathy; CT chest with contrast recommended for further assessment. Electronically Signed   By: Lavonia Dana M.D.   On: 10/09/2021 08:41   CT CHEST WO CONTRAST  Result Date: 10/09/2021 CLINICAL DATA:  Sepsis EXAM: CT CHEST WITHOUT CONTRAST TECHNIQUE: Multidetector CT imaging of the chest was performed following the standard protocol without IV contrast. COMPARISON:  Chest x-ray 10/09/2021, CT chest 02/22/2020 FINDINGS: Cardiovascular: Limited evaluation without intravenous contrast. Mild atherosclerosis. No  aneurysm. Common origin of right brachiocephalic and left common carotid artery. Cardiac size within normal limits. No significant pericardial effusion. Mediastinum/Nodes: Midline trachea. No thyroid mass. No suspicious adenopathy. Note that assessment for hilar nodes is limited without contrast. Esophagus within normal limits Lungs/Pleura: Trace left-sided pleural effusion. Mild bronchiectasis and distortion in the posterior right upper lobe with surrounding ground-glass density consistent with scarring. No acute airspace disease or pneumothorax. Mild right lower lobe bronchiectasis as well with scarring. Upper Abdomen: No acute abnormality. Musculoskeletal: Chronic fracture deformity of the upper sternal body. No acute osseous abnormality IMPRESSION: 1. Negative for acute airspace disease. Areas of chronic scarring in the right upper and lower lobes. 2. Small left-sided pleural effusion. Aortic Atherosclerosis (ICD10-I70.0). Electronically Signed   By: Donavan Foil M.D.   On: 10/09/2021 22:56    Assessment/Plan: Postop day 8 lumbar fusion surgery atrial fibrillation postoperative anemia slightly higher hemoglobin on yesterday's blood work no fever today blood pressure stable continue medical work-up appreciate cardiology and [facility] hospitalists involvement mobilize with therapy patient can be discharged when cleared from cardiology and medicine  LOS: 8 days     Bryan [name] 10/10/2021, 8:12 AM

## 2021-10-10 NOTE — Progress Notes (Signed)
Rt Note:  Spoke with RN as she was coming form patients room about CPAP. Per RN patient refused and has been for several nights. States patient prefers to wear oxygen for HS use. CPAP on standby if patient would like to use.

## 2021-10-10 NOTE — Progress Notes (Signed)
Physical Therapy Treatment Patient Details Name: Bryan [name] MRN: [id number] DOB: 08-02-1946 Today's Date: 10/10/2021   History of Present Illness 75 yo male s/p L2-4 decompression and fusion, exploration of L4-S1 fusion, redo L5-S1 fusion on 11/22. Developed tachycardia up to 150s on 11/27, cardiology following. PMH includes OA, BPH, cancer, DM, obesity, RA, seizures, ACDF 2014, lap chole, lumbar surgeries 2016-2017.    PT Comments    Pt worried about stool incontinence during session, states his stools have been near-constant today. Pt overall requiring light to max assist of 2 for mobility, pt requiring most help for bed-level mobility given back discomfort. Pt ambulatory for short room distance today, fatigues quickly. D/c plan remains appropriate.     Recommendations for follow up therapy are one component of a multi-disciplinary discharge planning process, led by the attending physician.  Recommendations may be updated based on patient status, additional functional criteria and insurance authorization.  Follow Up Recommendations  Skilled nursing-short term rehab (<3 hours/day)     Assistance Recommended at Discharge Frequent or constant Supervision/Assistance  Equipment Recommendations  Rolling walker (2 wheels);BSC/3in1    Recommendations for Other Services       Precautions / Restrictions Precautions Precautions: Fall;Back Precaution Comments: poor recall of back precautions Required Braces or Orthoses: Spinal Brace Spinal Brace: Lumbar corset;Applied in sitting position Restrictions Weight Bearing Restrictions: No     Mobility  Bed Mobility Overal bed mobility: Needs Assistance Bed Mobility: Rolling;Supine to Sit Rolling: Max assist   Supine to sit: +2 for physical assistance;+2 for safety/equipment;Max assist;HOB elevated     General bed mobility comments: pt needs max cues and (A) to elevate trunk from surface    Transfers Overall transfer level: Needs  assistance Equipment used: Standard walker Transfers: Sit to/from Stand Sit to Stand: +2 physical assistance;Mod assist;+2 safety/equipment;From elevated surface           General transfer comment: pt needs cues for hand placement and safety, sts x2 from eOB and BSC.    Ambulation/Gait Ambulation/Gait assistance: Min assist;+2 safety/equipment;+2 physical assistance Gait Distance (Feet): 15 Feet Assistive device: Rolling walker (2 wheels) Gait Pattern/deviations: Step-through pattern;Decreased stride length;Trunk flexed Gait velocity: dcr     General Gait Details: assist to steady, guide pt and RW. Cues for upright posture, placment in RW   Stairs             Wheelchair Mobility    Modified Rankin (Stroke Patients Only)       Balance Overall balance assessment: Needs assistance Sitting-balance support: No upper extremity supported;Feet supported Sitting balance-Leahy Scale: Fair     Standing balance support: Bilateral upper extremity supported;During functional activity;Reliant on assistive device for balance Standing balance-Leahy Scale: Poor Standing balance comment: reliant on RW                            Cognition Arousal/Alertness: Awake/alert Behavior During Therapy: Flat affect Overall Cognitive Status: Impaired/Different from baseline Area of Impairment: Safety/judgement;Memory;Attention;Following commands;Problem solving                   Current Attention Level: Sustained Memory: Decreased recall of precautions Following Commands: Follows one step commands inconsistently Safety/Judgement: Decreased awareness of deficits   Problem Solving: Slow processing;Decreased initiation;Difficulty sequencing;Requires verbal cues;Requires tactile cues General Comments: pt more alert and more oriented then previous session. pt very internally fixated on need to void bowels. pt reports "i can't stop it it just comes "  Exercises       General Comments        Pertinent Vitals/Pain Pain Assessment: Faces Faces Pain Scale: Hurts little more Pain Location: back Pain Descriptors / Indicators: Discomfort;Grimacing;Sore Pain Intervention(s): Limited activity within patient's tolerance;Monitored during session;Repositioned    Home Living                          Prior Function            PT Goals (current goals can now be found in the care plan section) Acute Rehab PT Goals Patient Stated Goal: to get stronger PT Goal Formulation: With patient Time For Goal Achievement: 10/17/21 Potential to Achieve Goals: Good Progress towards PT goals: Progressing toward goals    Frequency    Min 5X/week      PT Plan Current plan remains appropriate    Co-evaluation PT/OT/SLP Co-Evaluation/Treatment: Yes Reason for Co-Treatment: To address functional/ADL transfers;For patient/therapist safety PT goals addressed during session: Mobility/safety with mobility;Balance        AM-PAC PT "6 Clicks" Mobility   Outcome Measure  Help needed turning from your back to your side while in a flat bed without using bedrails?: A Lot Help needed moving from lying on your back to sitting on the side of a flat bed without using bedrails?: A Lot Help needed moving to and from a bed to a chair (including a wheelchair)?: A Lot Help needed standing up from a chair using your arms (e.g., wheelchair or bedside chair)?: A Lot Help needed to walk in hospital room?: A Lot Help needed climbing 3-5 steps with a railing? : Total 6 Click Score: 11    End of Session Equipment Utilized During Treatment: Back brace Activity Tolerance: Patient tolerated treatment well;Patient limited by fatigue Patient left: in chair;with call bell/phone within reach;with chair alarm set Nurse Communication: Mobility status PT Visit Diagnosis: Other abnormalities of gait and mobility (R26.89);Muscle weakness (generalized) (M62.81)     Time:  0277-4128 PT Time Calculation (min) (ACUTE ONLY): 31 min  Charges:  $Therapeutic Activity: 8-22 mins           Stacie Glaze, PT DPT Acute Rehabilitation Services Pager [phone]  Office [phone]     Roxine Caddy E Ruffin Pyo 10/10/2021, 4:01 PM

## 2021-10-10 NOTE — Progress Notes (Signed)
EKG CRITICAL VALUE     12 lead EKG performed.  Critical value noted.  Juliane Lack, RN notified.   Willaim Rayas, CCT 10/10/2021 10:34 AM

## 2021-10-11 ENCOUNTER — Inpatient Hospital Stay (HOSPITAL_COMMUNITY): Payer: No Typology Code available for payment source

## 2021-10-11 DIAGNOSIS — I4892 Unspecified atrial flutter: Secondary | ICD-10-CM | POA: Diagnosis not present

## 2021-10-11 LAB — ECHOCARDIOGRAM COMPLETE
AV Vena cont: 0.3 cm
Height: 65.5 in
S' Lateral: 3.2 cm
Weight: 3584 oz

## 2021-10-11 LAB — CBC
HCT: 18.7 % — ABNORMAL LOW (ref 39.0–52.0)
Hemoglobin: 6.1 g/dL — CL (ref 13.0–17.0)
MCH: 31.4 pg (ref 26.0–34.0)
MCHC: 32.6 g/dL (ref 30.0–36.0)
MCV: 96.4 fL (ref 80.0–100.0)
Platelets: 323 10*3/uL (ref 150–400)
RBC: 1.94 MIL/uL — ABNORMAL LOW (ref 4.22–5.81)
RDW: 13.6 % (ref 11.5–15.5)
WBC: 8 10*3/uL (ref 4.0–10.5)
nRBC: 0.2 % (ref 0.0–0.2)

## 2021-10-11 LAB — COMPREHENSIVE METABOLIC PANEL
ALT: 36 U/L (ref 0–44)
AST: 47 U/L — ABNORMAL HIGH (ref 15–41)
Albumin: 1.9 g/dL — ABNORMAL LOW (ref 3.5–5.0)
Alkaline Phosphatase: 181 U/L — ABNORMAL HIGH (ref 38–126)
Anion gap: 7 (ref 5–15)
BUN: 42 mg/dL — ABNORMAL HIGH (ref 8–23)
CO2: 27 mmol/L (ref 22–32)
Calcium: 8.3 mg/dL — ABNORMAL LOW (ref 8.9–10.3)
Chloride: 100 mmol/L (ref 98–111)
Creatinine, Ser: 2.09 mg/dL — ABNORMAL HIGH (ref 0.61–1.24)
GFR, Estimated: 32 mL/min — ABNORMAL LOW (ref >60)
Glucose, Bld: 119 mg/dL — ABNORMAL HIGH (ref 70–99)
Potassium: 3.4 mmol/L — ABNORMAL LOW (ref 3.5–5.1)
Sodium: 134 mmol/L — ABNORMAL LOW (ref 135–145)
Total Bilirubin: 0.5 mg/dL (ref 0.3–1.2)
Total Protein: 5.6 g/dL — ABNORMAL LOW (ref 6.5–8.1)

## 2021-10-11 MED ORDER — GABAPENTIN 600 MG PO TABS
300.0000 mg | ORAL_TABLET | Freq: Two times a day (BID) | ORAL | Status: DC
  Administered 2021-10-11 – 2021-10-14 (×7): 300 mg via ORAL
  Filled 2021-10-11 (×7): qty 1

## 2021-10-11 MED ORDER — SODIUM CHLORIDE 0.9 % IV SOLN: INTRAVENOUS | Status: AC

## 2021-10-11 MED ORDER — BETHANECHOL CHLORIDE 10 MG PO TABS
10.0000 mg | ORAL_TABLET | Freq: Three times a day (TID) | ORAL | Status: AC
  Administered 2021-10-11 (×2): 10 mg via ORAL
  Filled 2021-10-11 (×2): qty 1

## 2021-10-11 MED ORDER — LACTULOSE 10 GM/15ML PO SOLN
20.0000 g | Freq: Three times a day (TID) | ORAL | Status: AC
  Administered 2021-10-11 (×2): 20 g via ORAL
  Filled 2021-10-11 (×2): qty 30

## 2021-10-11 MED ORDER — METHYLPREDNISOLONE SODIUM SUCC 125 MG IJ SOLR
80.0000 mg | Freq: Once | INTRAMUSCULAR | Status: AC
Start: 2021-10-11 — End: 2021-10-11
  Administered 2021-10-11: 80 mg via INTRAVENOUS
  Filled 2021-10-11: qty 2

## 2021-10-11 MED ORDER — IPRATROPIUM-ALBUTEROL 0.5-2.5 (3) MG/3ML IN SOLN
3.0000 mL | RESPIRATORY_TRACT | Status: DC | PRN
  Administered 2021-10-11 – 2021-10-18 (×2): 3 mL via RESPIRATORY_TRACT
  Filled 2021-10-11 (×3): qty 3

## 2021-10-11 MED ORDER — BISACODYL 10 MG RE SUPP
10.0000 mg | Freq: Once | RECTAL | Status: AC
  Administered 2021-10-11: 10 mg via RECTAL
  Filled 2021-10-11: qty 1

## 2021-10-11 NOTE — Progress Notes (Signed)
Echocardiogram 2D Echocardiogram has been performed.  Oneal Deputy Albert Hersch RDCS 10/11/2021, 9:34 AM

## 2021-10-11 NOTE — Progress Notes (Signed)
OT Note  Pt progressing with therapy but reports weakness in R LE and decreased focus with reading. Pt unable to read card with glasses don. Pt progressed OOB this session but continues to have deficits with voiding bladder. Pt noted to have 300 cc in urinal. Recommendation remains SNF. Concerns for changes in cognition and inability to read card.     10/11/21 1900  OT Visit Information  Last OT Received On 10/11/21  Assistance Needed +1  History of Present Illness 75 yo male s/p L2-4 decompression and fusion, exploration of L4-S1 fusion, redo L5-S1 fusion on 11/22. Developed tachycardia up to 150s on 11/27, cardiology following. PMH includes OA, BPH, cancer, DM, obesity, RA, seizures, ACDF 2014, lap chole, lumbar surgeries 2016-2017.  Precautions  Precautions Fall;Back  Precaution Comments unable to recall precautions  Required Braces or Orthoses Spinal Brace  Spinal Brace Lumbar corset;Applied in sitting position  Pain Assessment  Pain Assessment Faces  Faces Pain Scale 2  Pain Descriptors / Indicators Discomfort;Grimacing;Sore  Pain Intervention(s) Monitored during session;Repositioned  Cognition  Arousal/Alertness Awake/alert  Behavior During Therapy Flat affect  Overall Cognitive Status Impaired/Different from baseline  Area of Impairment Memory;Attention  Memory Decreased recall of precautions;Decreased short-term memory  Safety/Judgement Decreased awareness of deficits  Problem Solving Slow processing;Decreased initiation;Difficulty sequencing;Requires verbal cues;Requires tactile cues  General Comments pt recieving flowers with deficits reading card. pt unable to report who the flowers are from. the patient reports "neal family" it could be my daughter family i am not sure. pt asked to tell RN who the flowers were from based on reading the card x2 within 5 minutes. pt was reporting incorrect name x2 and no recall of neal family. pt states "my wife can tell you"  ADL  Overall  ADL's  Needs assistance/impaired  Upper Body Dressing  Minimal assistance  Upper Body Dressing Details (indicate cue type and reason) pt don brace at eob with positioning of brace behind patient only. pt was close and pull strings  Functional mobility during ADLs Moderate assistance;Rolling walker (2 wheels)  General ADL Comments pt without void but producing a muscus like d/c x2 during session with gas. pt unable to voidd but did have 300 cc in urinal. pt progressed from bed to transfer in room to chair to bathroom to bed  Bed Mobility  Overal bed mobility Needs Assistance  Bed Mobility Rolling;Supine to Sit;Sit to Supine  Rolling Min assist  Supine to sit Min assist  Sit to supine Min assist  General bed mobility comments pt with mod cues to sequence but able to (A) with transfer more this session compared to previous. pt more aware of expectations o ftherapy session  Transfers  Overall transfer level Needs assistance  Equipment used Rolling walker (2 wheels)  Transfers Sit to/from Stand  Sit to Stand Mod assist  General transfer comment pt requires (A) to power up and pulling on RW  Balance  Overall balance assessment Needs assistance  Sitting-balance support Feet supported  Sitting balance-Leahy Scale Fair  Standing balance support Bilateral upper extremity supported;During functional activity  Standing balance-Leahy Scale Poor  General Comments  General comments (skin integrity, edema, etc.) VSS  OT - End of Session  Equipment Utilized During Treatment Back brace;Rolling walker (2 wheels)  Activity Tolerance Patient tolerated treatment well  Patient left in bed;with call bell/phone within reach;with nursing/sitter in room (RN in room to assist with foley needs)  Nurse Communication Mobility status;Precautions  OT Assessment/Plan  OT Plan Discharge plan remains  appropriate  OT Visit Diagnosis Unsteadiness on feet (R26.81);Muscle weakness (generalized) (M62.81);Pain  OT  Frequency (ACUTE ONLY) Min 2X/week  Follow Up Recommendations Skilled nursing-short term rehab (<3 hours/day)  Assistance recommended at discharge Intermittent Supervision/Assistance  OT Equipment BSC/3in1;Other (comment)  AM-PAC OT "6 Clicks" Daily Activity Outcome Measure (Version 2)  Help from another person eating meals? 3  Help from another person taking care of personal grooming? 3  Help from another person toileting, which includes using toliet, bedpan, or urinal? 2  Help from another person bathing (including washing, rinsing, drying)? 2  Help from another person to put on and taking off regular upper body clothing? 3  Help from another person to put on and taking off regular lower body clothing? 2  6 Click Score 15  Progressive Mobility  What is the highest level of mobility based on the progressive mobility assessment? Level 4 (Walks with assist in room) - Balance while marching in place and cannot step forward and back - Complete  Mobility Out of bed for toileting  OT Goal Progression  Progress towards OT goals Progressing toward goals  Acute Rehab OT Goals  Patient Stated Goal to go home by friday  Time For Goal Achievement 10/17/21  Potential to Achieve Goals Good  ADL Goals  Pt Will Perform Grooming with min guard assist;sitting  Pt Will Perform Lower Body Dressing with supervision;with adaptive equipment;sit to/from stand  Pt Will Transfer to Toilet with min guard assist;bedside commode;ambulating  Additional ADL Goal #1 pt will complete bed mobility supervision level for adls.  OT Time Calculation  OT Start Time (ACUTE ONLY) 1424  OT Stop Time (ACUTE ONLY) 1457  OT Time Calculation (min) 33 min  OT General Charges  $OT Visit 1 Visit  OT Treatments  $Self Care/Home Management  23-37 mins   Brynn, OTR/L  Acute Rehabilitation Services Pager: [phone] Office: [phone] .

## 2021-10-11 NOTE — Progress Notes (Signed)
Pt had clear, watery mucous coming out of his rectum this morning. Per nightshift RN, pt had this occurrence twice overnight. There appears to be no stool associated with it. This RN let Dr. Broadus Nakoa know, who ordered Lactulose and suppository for possible fecal impaction with overflow.   Pt had also urinated small amounts up to 300 mL. Pt bladder scanned at 507 mL and unable to produce anymore urine. Orders from Dr. Broadus Caesar to proceed with foley replacement as pt was already in and out cathed twice since foley removal. 500 mL urine returned immediately after foley placement. Per pt's significant other, Flomax has not worked for him historically. This was relayed to Dr. Broadus Rusell and Urecholine was ordered.   Justice Rocher, RN

## 2021-10-11 NOTE — Progress Notes (Signed)
Subjective: Patient reports  doing well neurologically felt like he had a lot of diarrhea overnight however nurse did not support this.  Objective: Vital signs in last 24 hours: Temp:  [98.1 F (36.7 C)-100.4 F (38 C)] 98.1 F (36.7 C) (11/30 1145) Pulse Rate:  [84-105] 90 (11/30 1145) Resp:  [18-24] 19 (11/30 1145) BP: (115-150)/(53-89) 122/62 (11/30 1145) SpO2:  [95 %-100 %] 95 % (11/30 1145)  Intake/Output from previous day: 11/29 0701 - 11/30 0700 In: 4424.4 [P.O.:840; I.V.:3314.4; IV Piggyback:270.1] Out: 1155 [Urine:1154; Stool:1] Intake/Output this shift: No intake/output data recorded.  Strength 5/5 wound clean dry and intact  Lab Results: Recent Labs    10/10/21 0318 10/11/21 0348  WBC 8.1 8.0  HGB 6.1* 6.1*  HCT 18.4* 18.7*  PLT 271 323   BMET Recent Labs    10/10/21 0318 10/11/21 0348  NA 134* 134*  K 3.7 3.4*  CL 98 100  CO2 27 27  GLUCOSE 122* 119*  BUN 45* 42*  CREATININE 2.08* 2.09*  CALCIUM 8.0* 8.3*    Studies/Results: CT CHEST WO CONTRAST  Result Date: 10/09/2021 CLINICAL DATA:  Sepsis EXAM: CT CHEST WITHOUT CONTRAST TECHNIQUE: Multidetector CT imaging of the chest was performed following the standard protocol without IV contrast. COMPARISON:  Chest x-ray 10/09/2021, CT chest 02/22/2020 FINDINGS: Cardiovascular: Limited evaluation without intravenous contrast. Mild atherosclerosis. No aneurysm. Common origin of right brachiocephalic and left common carotid artery. Cardiac size within normal limits. No significant pericardial effusion. Mediastinum/Nodes: Midline trachea. No thyroid mass. No suspicious adenopathy. Note that assessment for hilar nodes is limited without contrast. Esophagus within normal limits Lungs/Pleura: Trace left-sided pleural effusion. Mild bronchiectasis and distortion in the posterior right upper lobe with surrounding ground-glass density consistent with scarring. No acute airspace disease or pneumothorax. Mild right lower  lobe bronchiectasis as well with scarring. Upper Abdomen: No acute abnormality. Musculoskeletal: Chronic fracture deformity of the upper sternal body. No acute osseous abnormality IMPRESSION: 1. Negative for acute airspace disease. Areas of chronic scarring in the right upper and lower lobes. 2. Small left-sided pleural effusion. Aortic Atherosclerosis (ICD10-I70.0). Electronically Signed   By: Donavan Foil M.D.   On: 10/09/2021 22:56   ECHOCARDIOGRAM COMPLETE  Result Date: 10/11/2021    ECHOCARDIOGRAM REPORT   Patient Name:   Bryan [name] Date of Exam: 10/11/2021 Medical Rec #:  [id number]     Height:       65.5 in Accession #:    [id number]    Weight:       224.0 lb Date of Birth:  08/24/1946    BSA:          2.088 m Patient Age:    75 years      BP:           136/89 mmHg Patient Gender: M             HR:           98 bpm. Exam Location:  Inpatient Procedure: 2D Echo, Color Doppler and Cardiac Doppler Indications:    I48.92* Unspecified atrial flutter  History:        Patient has prior history of Echocardiogram examinations, most                 recent 07/30/2018. CAD, Arrythmias:Atrial Fibrillation; Risk                 Factors:Hypertension and Sleep Apnea.  Sonographer:    Raquel Sarna Senior RDCS Referring Phys: [id number] Ranshaw C  [place]  Sonographer Comments: Technically difficult study due to poor echo windows. IMPRESSIONS  1. Left ventricular ejection fraction, by estimation, is 55 to 60%. The left ventricle has normal function. The left ventricle has no regional wall motion abnormalities. Left ventricular diastolic parameters are indeterminate. There is the interventricular septum is flattened in diastole ('D' shaped left ventricle), consistent with right ventricular volume overload.  2. Right ventricular systolic function is normal. The right ventricular size is normal. There is moderately elevated pulmonary artery systolic pressure.  3. The mitral valve is normal in structure. No evidence of mitral valve  regurgitation. No evidence of mitral stenosis.  4. Tricuspid valve regurgitation is moderate.  5. The aortic valve is normal in structure. There is mild thickening of the aortic valve. Aortic valve regurgitation is mild. No aortic stenosis is present.  6. The inferior vena cava is normal in size with greater than 50% respiratory variability, suggesting right atrial pressure of 3 mmHg. Comparison(s): A prior study was performed on 07/30/2018. FINDINGS  Left Ventricle: Left ventricular ejection fraction, by estimation, is 55 to 60%. The left ventricle has normal function. The left ventricle has no regional wall motion abnormalities. The left ventricular internal cavity size was normal in size. There is  no left ventricular hypertrophy. The interventricular septum is flattened in diastole ('D' shaped left ventricle), consistent with right ventricular volume overload. Left ventricular diastolic parameters are indeterminate. Right Ventricle: The right ventricular size is normal. No increase in right ventricular wall thickness. Right ventricular systolic function is normal. There is moderately elevated pulmonary artery systolic pressure. The tricuspid regurgitant velocity is 3.59 m/s, and with an assumed right atrial pressure of 8 mmHg, the estimated right ventricular systolic pressure is 52.8 mmHg. Left Atrium: Left atrial size was normal in size. Right Atrium: Right atrial size was normal in size. Pericardium: There is no evidence of pericardial effusion. Presence of epicardial fat layer. Mitral Valve: The mitral valve is normal in structure. No evidence of mitral valve regurgitation. No evidence of mitral valve stenosis. Tricuspid Valve: The tricuspid valve is normal in structure. Tricuspid valve regurgitation is moderate . No evidence of tricuspid stenosis. Aortic Valve: The aortic valve is normal in structure. There is mild thickening of the aortic valve. There is mild aortic valve annular calcification. Aortic valve  regurgitation is mild. No aortic stenosis is present. Pulmonic Valve: The pulmonic valve was normal in structure. Pulmonic valve regurgitation is not visualized. No evidence of pulmonic stenosis. Aorta: The aortic root was not well visualized. Venous: The inferior vena cava is normal in size with greater than 50% respiratory variability, suggesting right atrial pressure of 3 mmHg. IAS/Shunts: No atrial level shunt detected by color flow Doppler.  LEFT VENTRICLE PLAX 2D LVIDd:         5.10 cm LVIDs:         3.20 cm LV PW:         0.80 cm LV IVS:        1.10 cm LVOT diam:     2.00 cm LV SV:         62 LV SV Index:   30 LVOT Area:     3.14 cm  RIGHT VENTRICLE RV S prime:     10.60 cm/s TAPSE (M-mode): 2.4 cm LEFT ATRIUM             Index        RIGHT ATRIUM           Index LA diam:  3.70 cm 1.77 cm/m   RA Area:     20.10 cm LA Vol (A2C):   48.4 ml 23.18 ml/m  RA Volume:   62.70 ml  30.03 ml/m LA Vol (A4C):   32.8 ml 15.74 ml/m LA Biplane Vol: 46.9 ml 22.47 ml/m  AORTIC VALVE LVOT Vmax:         111.27 cm/s LVOT Vmean:        84.700 cm/s LVOT VTI:          0.197 m AR Vena Contracta: 0.30 cm  AORTA Ao Root diam: 3.00 cm TRICUSPID VALVE TR Peak grad:   51.6 mmHg TR Vmax:        359.00 cm/s  SHUNTS Systemic VTI:  0.20 m Systemic Diam: 2.00 cm Kardie Tobb DO Electronically signed by Berniece Salines DO Signature Date/Time: 10/11/2021/12:00:57 PM    Final     Assessment/Plan: Postop day 9 lumbar fusion good progress neurologically with therapy fevers defervesced appreciate cardiology and internal medicine's help with atrial fibrillation fever work-up.  Patient is stable for discharge when cleared from medicine and cardiology.  LOS: 9 days     Elaina Hoops 10/11/2021, 1:37 PM

## 2021-10-11 NOTE — Progress Notes (Signed)
Patient having multiple clear mucous stools.

## 2021-10-11 NOTE — Progress Notes (Addendum)
Pt noted to have upper airway wheezing. Dr. Broadus Corbitt was notified. See new orders.  Megan, RT was also called for a second opinion and to administer ordered neb treatment.   Justice Rocher, RN

## 2021-10-11 NOTE — Progress Notes (Signed)
Physical Therapy Treatment Patient Details Name: Bryan [name] MRN: [id number] DOB: 05-24-1946 Today's Date: 10/11/2021   History of Present Illness 75 yo male s/p L2-4 decompression and fusion, exploration of L4-S1 fusion, redo L5-S1 fusion on 11/22. Developed tachycardia up to 150s on 11/27, cardiology following. PMH includes OA, BPH, cancer, DM, obesity, RA, seizures, ACDF 2014, lap chole, lumbar surgeries 2016-2017.    PT Comments    Patient progressing towards physical therapy goals. Patient ambulated 85' with Rw and minA for balance. Patient continues to demo memory deficits with difficulty recalling back precautions but able to recall 3/3 with increased time. Patient requires maxA to don brace in sitting. Continue to recommend SNF for ongoing Physical Therapy.       Recommendations for follow up therapy are one component of a multi-disciplinary discharge planning process, led by the attending physician.  Recommendations may be updated based on patient status, additional functional criteria and insurance authorization.  Follow Up Recommendations  Skilled nursing-short term rehab (<3 hours/day)     Assistance Recommended at Discharge Frequent or constant Supervision/Assistance  Equipment Recommendations  Rolling Joliana Claflin (2 wheels);BSC/3in1    Recommendations for Other Services       Precautions / Restrictions Precautions Precautions: Fall;Back Precaution Booklet Issued: Yes (comment) Precaution Comments: able to recall 3/3 precautions with increased time Required Braces or Orthoses: Spinal Brace Spinal Brace: Lumbar corset;Applied in sitting position Restrictions Weight Bearing Restrictions: No     Mobility  Bed Mobility Overal bed mobility: Needs Assistance Bed Mobility: Rolling;Sidelying to Sit Rolling: Mod assist Sidelying to sit: Mod assist     Sit to sidelying: Min assist General bed mobility comments: modA to roll towards R side and elevate trunk. MinA to  return to supine, however required cues for maintaining back precautions and log roll technique    Transfers Overall transfer level: Needs assistance Equipment used: Rolling Sevanna Ballengee (2 wheels) Transfers: Sit to/from Stand Sit to Stand: Min assist           General transfer comment: minA to rise and steady with cues for hand placement as patient tends to reach for Horacio Werth to stand    Ambulation/Gait Ambulation/Gait assistance: Min assist Gait Distance (Feet): 30 Feet Assistive device: Rolling Evelin Cake (2 wheels) Gait Pattern/deviations: Step-through pattern;Decreased stride length;Trunk flexed Gait velocity: decreased     General Gait Details: assist for balance and RW management. Cues for maintaining close proximity to RW and upright posture with intermittent follow through. Patient easily fatigued   Stairs             Wheelchair Mobility    Modified Rankin (Stroke Patients Only)       Balance Overall balance assessment: Needs assistance Sitting-balance support: No upper extremity supported;Feet supported Sitting balance-Leahy Scale: Fair     Standing balance support: Bilateral upper extremity supported;During functional activity;Reliant on assistive device for balance Standing balance-Leahy Scale: Poor Standing balance comment: reliant on RW                            Cognition Arousal/Alertness: Awake/alert Behavior During Therapy: Flat affect Overall Cognitive Status: Impaired/Different from baseline Area of Impairment: Attention;Memory;Safety/judgement;Problem solving                   Current Attention Level: Sustained Memory: Decreased recall of precautions Following Commands: Follows one step commands with increased time Safety/Judgement: Decreased awareness of deficits   Problem Solving: Slow processing;Decreased initiation;Difficulty sequencing;Requires verbal cues;Requires tactile cues  General Comments: patient stating  initially that OT walked patient to end of unit, but then corrects self and states "just in the room". Able to recall precautions with increased time        Exercises General Exercises - Lower Extremity Long Arc Quad: Both;5 reps;Seated Hip Flexion/Marching: Both;5 reps;Seated Toe Raises: Both;5 reps;Seated Heel Raises: Both;5 reps;Seated    General Comments        Pertinent Vitals/Pain Pain Assessment: Faces Faces Pain Scale: Hurts a little bit Pain Location: back Pain Descriptors / Indicators: Discomfort;Grimacing;Sore Pain Intervention(s): Monitored during session;Repositioned    Home Living                          Prior Function            PT Goals (current goals can now be found in the care plan section) Acute Rehab PT Goals Patient Stated Goal: to get stronger PT Goal Formulation: With patient Time For Goal Achievement: 10/17/21 Potential to Achieve Goals: Good Progress towards PT goals: Progressing toward goals    Frequency    Min 5X/week      PT Plan Current plan remains appropriate    Co-evaluation              AM-PAC PT "6 Clicks" Mobility   Outcome Measure  Help needed turning from your back to your side while in a flat bed without using bedrails?: A Lot Help needed moving from lying on your back to sitting on the side of a flat bed without using bedrails?: A Lot Help needed moving to and from a bed to a chair (including a wheelchair)?: A Little Help needed standing up from a chair using your arms (e.g., wheelchair or bedside chair)?: A Little Help needed to walk in hospital room?: A Little Help needed climbing 3-5 steps with a railing? : Total 6 Click Score: 14    End of Session Equipment Utilized During Treatment: Gait belt;Back brace Activity Tolerance: Patient tolerated treatment well Patient left: in bed;with call bell/phone within reach;with bed alarm set Nurse Communication: Mobility status PT Visit Diagnosis: Other  abnormalities of gait and mobility (R26.89);Muscle weakness (generalized) (M62.81)     Time: 3559-7416 PT Time Calculation (min) (ACUTE ONLY): 16 min  Charges:  $Gait Training: 8-22 mins                     Geraldine Sandberg A. Gilford Rile PT, DPT Acute Rehabilitation Services Pager [phone] Office [phone]    Linna Hoff 10/11/2021, 5:48 PM

## 2021-10-11 NOTE — Progress Notes (Signed)
PROGRESS NOTE    Bryan [name]  TMH:[id number] DOB: 02-06-1946 DOA: 10/02/2021 PCP: [name], FNP  Brief Narrative: 74/M with history of marginal zone B-cell lymphoma, BPH, OSA, hypertension, CAD, diabetes mellitus, obesity, Jehovah's Witness underwent lumbar fusion with removal of hardware of L4-S1 on 11/21.  Hospital course was complicated by A. fib RVR, confusion, fevers and anemia-Hb 6.1 in the last 2 days.  -TRH was consulted 11/28 for evaluation of fevers, confusion   Assessment & Plan:   Postop fever  -Appears to be subsiding, T-max 100.4 yesterday, afebrile since, no leukocytosis -Source not clear, could be atelectasis, also had a Foley catheter, this was removed 11/29, urinalysis was unremarkable, blood cultures negative so far, urine culture 11/27 is negative -CT chest with chronic findings only, otherwise unremarkable -Wound exam is unremarkable, no drainage, surrounding erythema tenderness or warmth -Continue Levaquin for 5 to 7 days -Ambulate, PT eval  Atrial fibrillation/flutter -Cards following, back in sinus rhythm now -Remains on Toprol, off IV amiodarone -Echo today -Hold off on anticoagulation in the setting of severe anemia  History of CAD, abnormal EKG -Cardiology following, no symptoms of ACS -Continue beta-blocker and statin  S/p lumbar fusion 11/21 -Per Dr. Saintclair Halsted, PT OT -Discharge planning  BPH -UA unremarkable, removed Foley catheter 11/29, monitor for retention -Continue Flomax  AKI on CKD 3 A -Holding HCTZ, hydrated with IV fluids, creatinine appears to be plateauing -Baseline creatinine around 1.3  Postop anemia Iron deficiency anemia -Hemoglobin dropped to 6.1 from 11/12 range on admission, worsened by hemodilution -Patient is a Jehovah's Witness, adamantly declines blood products -Anemia panel noted iron deficiency, started on IV iron infusions  History of RA -Continue Plaquenil, also on Rituxan as outpatient  History of B-cell  lymphoma -Reportedly in remission per last oncology note  DVT prophylaxis: SCDs     Procedures: Lumbar fusion 11/21  Antimicrobials:    Subjective: -Feels okay, denies any specific complaints, appetite is poor, breathing better  Objective: Vitals:   10/10/21 2255 10/11/21 0327 10/11/21 0725 10/11/21 0956  BP: (!) 142/55 (!) 123/55 122/71 136/89  Pulse: 93 95 100 (!) 105  Resp: (!) 24 (!) 22 20   Temp: 99.9 F (37.7 C) 99.1 F (37.3 C) 98.1 F (36.7 C)   TempSrc: Oral Oral Oral   SpO2: 100% 97% 100%   Weight:      Height:        Intake/Output Summary (Last 24 hours) at 10/11/2021 1057 Last data filed at 10/11/2021 0413 Gross per 24 hour  Intake 4061.4 ml  Output 1152 ml  Net 2909.4 ml   Filed Weights   10/02/21 1041  Weight: 101.6 kg    Examination:  General exam: Elderly chronically ill male sitting up in bed, AAOx3, no distress CVS: S1-S2, regular rate rhythm Lungs: Decreased breath sounds bases, otherwise clear Abdomen: Soft, obese, nontender, bowel sounds present Extremities: No edema  Skin: No rashes on exposed skin, low back with surgical dressing, no warmth tenderness drainage etc.  Psychiatry:  Mood & affect appropriate.     Data Reviewed:   CBC: Recent Labs  Lab 10/05/21 1800 10/09/21 0226 10/10/21 0318 10/11/21 0348  WBC 11.1* 8.3 8.1 8.0  NEUTROABS  --  6.3  --   --   HGB 7.3* 6.1* 6.1* 6.1*  HCT 22.8* 18.2* 18.4* 18.7*  MCV 97.9 94.3 95.3 96.4  PLT 202 262 271 921   Basic Metabolic Panel: Recent Labs  Lab 10/09/21 0226 10/10/21 0318 10/11/21 0348  NA 133* 134* 134*  K 3.9 3.7 3.4*  CL 95* 98 100  CO2 30 27 27   GLUCOSE 132* 122* 119*  BUN 47* 45* 42*  CREATININE 2.31* 2.08* 2.09*  CALCIUM 8.0* 8.0* 8.3*   GFR: Estimated Creatinine Clearance: 33.3 mL/min (A) (by C-G formula based on SCr of 2.09 mg/dL (H)). Liver Function Tests: Recent Labs  Lab 10/09/21 0226 10/11/21 0348  AST 88* 47*  ALT 47* 36  ALKPHOS 179*  181*  BILITOT 0.6 0.5  PROT 6.0* 5.6*  ALBUMIN 2.0* 1.9*   No results for input(s): LIPASE, AMYLASE in the last 168 hours. No results for input(s): AMMONIA in the last 168 hours. Coagulation Profile: No results for input(s): INR, PROTIME in the last 168 hours. Cardiac Enzymes: No results for input(s): CKTOTAL, CKMB, CKMBINDEX, TROPONINI in the last 168 hours. BNP (last 3 results) No results for input(s): PROBNP in the last 8760 hours. HbA1C: No results for input(s): HGBA1C in the last 72 hours. CBG: Recent Labs  Lab 10/09/21 0038  GLUCAP 114*   Lipid Profile: No results for input(s): CHOL, HDL, LDLCALC, TRIG, CHOLHDL, LDLDIRECT in the last 72 hours. Thyroid Function Tests: No results for input(s): TSH, T4TOTAL, FREET4, T3FREE, THYROIDAB in the last 72 hours. Anemia Panel: Recent Labs    10/10/21 0920  VITAMINB12 175*  FOLATE 10.0  FERRITIN 167  TIBC 203*  IRON 21*  RETICCTPCT 1.3   Urine analysis:    Component Value Date/Time   COLORURINE YELLOW 10/09/2021 1047   APPEARANCEUR HAZY (A) 10/09/2021 1047   LABSPEC 1.011 10/09/2021 1047   PHURINE 5.0 10/09/2021 1047   GLUCOSEU NEGATIVE 10/09/2021 1047   HGBUR MODERATE (A) 10/09/2021 1047   BILIRUBINUR NEGATIVE 10/09/2021 1047   BILIRUBINUR negative 05/09/2018 1809   BILIRUBINUR neg 07/29/2013 0953   KETONESUR NEGATIVE 10/09/2021 1047   PROTEINUR NEGATIVE 10/09/2021 1047   UROBILINOGEN 0.2 05/09/2018 1809   UROBILINOGEN 0.2 02/08/2015 0815   NITRITE NEGATIVE 10/09/2021 1047   LEUKOCYTESUR NEGATIVE 10/09/2021 1047   Sepsis Labs: @LABRCNTIP (procalcitonin:4,lacticidven:4)  ) Recent Results (from the past 240 hour(s))  Urine Culture     Status: None   Collection Time: 10/08/21 10:15 AM   Specimen: Urine, Catheterized  Result Value Ref Range Status   Specimen Description URINE, CATHETERIZED  Final   Special Requests NONE  Final   Culture   Final    NO GROWTH Performed at [facility], [place] [address], [place], [name] [address]    Report Status 10/09/2021 FINAL  Final  Culture, blood (routine x 2)     Status: None (Preliminary result)   Collection Time: 10/09/21  2:26 AM   Specimen: BLOOD  Result Value Ref Range Status   Specimen Description BLOOD LEFT ANTECUBITAL  Final   Special Requests AEROBIC BOTTLE ONLY Blood Culture adequate volume  Final   Culture   Final    NO GROWTH 2 DAYS Performed at [facility], [place] [address], [place],  [address]    Report Status PENDING  Incomplete  Culture, blood (routine x 2)     Status: None (Preliminary result)   Collection Time: 10/09/21  2:26 AM   Specimen: BLOOD  Result Value Ref Range Status   Specimen Description BLOOD SITE NOT SPECIFIED  Final   Special Requests AEROBIC BOTTLE ONLY Blood Culture adequate volume  Final   Culture   Final    NO GROWTH 2 DAYS Performed at [facility], [place]  [address], [place], [place] [address]    Report Status PENDING  Incomplete  Urine Culture     Status: None   Collection Time: 10/09/21 11:32 AM   Specimen: Urine, Catheterized  Result Value Ref Range Status   Specimen Description URINE, CATHETERIZED  Final   Special Requests NONE  Final   Culture   Final    NO GROWTH Performed at [facility], [address] [place], [place] [address]    Report Status 10/10/2021 FINAL  Final         Radiology Studies: CT CHEST WO CONTRAST  Result Date: 10/09/2021 CLINICAL DATA:  Sepsis EXAM: CT CHEST WITHOUT CONTRAST TECHNIQUE: Multidetector CT imaging of the chest was performed following the standard protocol without IV contrast. COMPARISON:  Chest x-ray 10/09/2021, CT chest 02/22/2020 FINDINGS: Cardiovascular: Limited evaluation without intravenous contrast. Mild atherosclerosis. No aneurysm. Common origin of right brachiocephalic and left common carotid artery. Cardiac size within normal limits. No significant pericardial effusion. Mediastinum/Nodes: Midline trachea. No thyroid  mass. No suspicious adenopathy. Note that assessment for hilar nodes is limited without contrast. Esophagus within normal limits Lungs/Pleura: Trace left-sided pleural effusion. Mild bronchiectasis and distortion in the posterior right upper lobe with surrounding ground-glass density consistent with scarring. No acute airspace disease or pneumothorax. Mild right lower lobe bronchiectasis as well with scarring. Upper Abdomen: No acute abnormality. Musculoskeletal: Chronic fracture deformity of the upper sternal body. No acute osseous abnormality IMPRESSION: 1. Negative for acute airspace disease. Areas of chronic scarring in the right upper and lower lobes. 2. Small left-sided pleural effusion. Aortic Atherosclerosis (ICD10-I70.0). Electronically Signed   By: Donavan Foil M.D.   On: 10/09/2021 22:56    Scheduled Meds:  atorvastatin  20 mg Oral Daily   Chlorhexidine Gluconate Cloth  6 each Topical Daily   gabapentin  600 mg Oral BID   iron polysaccharides  150 mg Oral Daily   levofloxacin  250 mg Oral Daily   metoprolol succinate  100 mg Oral Daily   pantoprazole  40 mg Oral QHS   primidone  150 mg Oral QHS   sodium chloride flush  3 mL Intravenous Q12H   tamsulosin  0.4 mg Oral QHS   Continuous Infusions:  sodium chloride 250 mL (10/03/21 0533)   sodium chloride 75 mL/hr at 10/10/21 1850   amiodarone Stopped (10/09/21 1546)   ferric gluconate (FERRLECIT) IVPB 250 mg (10/11/21 1008)   sodium chloride       LOS: 9 days   Time spent: 27min  Domenic Polite, MD [facility]   10/11/2021, 10:57 AM

## 2021-10-12 DIAGNOSIS — M05741 Rheumatoid arthritis with rheumatoid factor of right hand without organ or systems involvement: Secondary | ICD-10-CM

## 2021-10-12 DIAGNOSIS — M05742 Rheumatoid arthritis with rheumatoid factor of left hand without organ or systems involvement: Secondary | ICD-10-CM

## 2021-10-12 LAB — CBC
HCT: 19.3 % — ABNORMAL LOW (ref 39.0–52.0)
Hemoglobin: 6.1 g/dL — CL (ref 13.0–17.0)
MCH: 30.7 pg (ref 26.0–34.0)
MCHC: 31.6 g/dL (ref 30.0–36.0)
MCV: 97 fL (ref 80.0–100.0)
Platelets: 377 10*3/uL (ref 150–400)
RBC: 1.99 MIL/uL — ABNORMAL LOW (ref 4.22–5.81)
RDW: 13.7 % (ref 11.5–15.5)
WBC: 10 10*3/uL (ref 4.0–10.5)
nRBC: 0.2 % (ref 0.0–0.2)

## 2021-10-12 LAB — BASIC METABOLIC PANEL
Anion gap: 9 (ref 5–15)
BUN: 38 mg/dL — ABNORMAL HIGH (ref 8–23)
CO2: 24 mmol/L (ref 22–32)
Calcium: 8.3 mg/dL — ABNORMAL LOW (ref 8.9–10.3)
Chloride: 104 mmol/L (ref 98–111)
Creatinine, Ser: 1.68 mg/dL — ABNORMAL HIGH (ref 0.61–1.24)
GFR, Estimated: 42 mL/min — ABNORMAL LOW (ref >60)
Glucose, Bld: 141 mg/dL — ABNORMAL HIGH (ref 70–99)
Potassium: 3.7 mmol/L (ref 3.5–5.1)
Sodium: 137 mmol/L (ref 135–145)

## 2021-10-12 NOTE — Progress Notes (Signed)
Occupational Therapy Treatment Patient Details Name: Bryan [name] MRN: [id number] DOB: [birth year] Today's Date: 10/12/2021   History of present illness 75 yo male s/p L2-4 decompression and fusion, exploration of L4-S1 fusion, redo L5-S1 fusion on 11/22. Developed tachycardia up to 150s on 11/27, cardiology following. PMH includes OA, BPH, cancer, DM, obesity, RA, seizures, ACDF 2014, lap chole, lumbar surgeries 2016-2017.   OT comments  Patient received in bed and asking to use bathroom. Patient asked to recite back precautions and stated he just needed to use bathroom. Education and min assist to donn back brace after setup. Patient ambulated to [facility] and required increased time to complete with max assist for hygiene. Patient performed grooming seated and standing at sink with assistance for balance. Acute OT to continue to follow.    Recommendations for follow up therapy are one component of a multi-disciplinary discharge planning process, led by the attending physician.  Recommendations may be updated based on patient status, additional functional criteria and insurance authorization.    Follow Up Recommendations  Skilled nursing-short term rehab (<3 hours/day)    Assistance Recommended at Discharge Intermittent Supervision/Assistance  Equipment Recommendations  BSC/3in1;Other (comment)    Recommendations for Other Services      Precautions / Restrictions Precautions Precautions: Fall;Back Precaution Booklet Issued: Yes (comment) Precaution Comments: difficulty recalling back precautions, concerned over need to use bathroom Required Braces or Orthoses: Spinal Brace Spinal Brace: Lumbar corset;Applied in sitting position       Mobility Bed Mobility Overal bed mobility: Needs Assistance Bed Mobility: Rolling;Sidelying to Sit;Sit to Sidelying Rolling: Min assist Sidelying to sit: Mod assist     Sit to sidelying: Mod assist General bed mobility comments: patient concern over  going to bathroom made it difficult to follow directions for log roling technique    Transfers Overall transfer level: Needs assistance Equipment used: Rolling walker (2 wheels) Transfers: Sit to/from Stand Sit to Stand: Min assist;From elevated surface           General transfer comment: min assist to power up from surfaces with cues for hand placement     Balance Overall balance assessment: Needs assistance Sitting-balance support: Feet supported Sitting balance-Leahy Scale: Fair Sitting balance - Comments: able to static sit on EOB   Standing balance support: Bilateral upper extremity supported;During functional activity;Reliant on assistive device for balance Standing balance-Leahy Scale: Poor Standing balance comment: reliant on RW or sink for support                           ADL either performed or assessed with clinical judgement   ADL Overall ADL's : Needs assistance/impaired     Grooming: Oral care;Wash/dry hands;Wash/dry face;Sitting;Standing;Moderate assistance Grooming Details (indicate cue type and reason): performed at sink with patient standing for oral care and seated for hand and face hygiene                 Toilet Transfer: Moderate assistance;BSC/3in1;Rolling walker (2 wheels) Toilet Transfer Details (indicate cue type and reason): required cues to reach back to sit and push up to stand Toileting- Clothing Manipulation and Hygiene: Maximal assistance;Sit to/from stand Toileting - Clothing Manipulation Details (indicate cue type and reason): patient stood with RW during toilet hygiene     Functional mobility during ADLs: Moderate assistance;Rolling walker (2 wheels) General ADL Comments: patient with muscus like discharge for BM and gas    Extremity/Trunk Assessment  Vision       Perception     Praxis      Cognition Arousal/Alertness: Awake/alert Behavior During Therapy: Flat affect Overall Cognitive Status:  Impaired/Different from baseline Area of Impairment: Memory;Attention;Following commands;Safety/judgement;Problem solving                   Current Attention Level: Sustained Memory: Decreased recall of precautions;Decreased short-term memory Following Commands: Follows one step commands with increased time Safety/Judgement: Decreased awareness of deficits;Decreased awareness of safety   Problem Solving: Slow processing;Decreased initiation;Difficulty sequencing;Requires verbal cues;Requires tactile cues General Comments: required cues for safety with mobilty and transfers          Exercises     Shoulder Instructions       General Comments Pt with complaints of substernal and R-sided chest pressure, especially when having BM on toilet. RN aware and present, pt returned to bed for further evaluation    Pertinent Vitals/ Pain       Pain Assessment: Faces Faces Pain Scale: Hurts even more Pain Location: back Pain Descriptors / Indicators: Discomfort;Grimacing;Sore Pain Intervention(s): Monitored during session;Repositioned  Home Living                                          Prior Functioning/Environment              Frequency  Min 2X/week        Progress Toward Goals  OT Goals(current goals can now be found in the care plan section)  Progress towards OT goals: Progressing toward goals  Acute Rehab OT Goals Time For Goal Achievement: 10/17/21 Potential to Achieve Goals: Good ADL Goals Pt Will Perform Grooming: with min guard assist;sitting Pt Will Perform Lower Body Dressing: with supervision;with adaptive equipment;sit to/from stand Pt Will Transfer to Toilet: with min guard assist;bedside commode;ambulating Additional ADL Goal #1: pt will complete bed mobility supervision level for adls.  Plan Discharge plan remains appropriate    Co-evaluation                 AM-PAC OT "6 Clicks" Daily Activity     Outcome Measure    Help from another person eating meals?: A Little Help from another person taking care of personal grooming?: A Little Help from another person toileting, which includes using toliet, bedpan, or urinal?: A Lot Help from another person bathing (including washing, rinsing, drying)?: A Lot Help from another person to put on and taking off regular upper body clothing?: A Little Help from another person to put on and taking off regular lower body clothing?: A Lot 6 Click Score: 15    End of Session Equipment Utilized During Treatment: Back brace;Rolling walker (2 wheels)  OT Visit Diagnosis: Unsteadiness on feet (R26.81);Muscle weakness (generalized) (M62.81);Pain   Activity Tolerance Patient tolerated treatment well   Patient Left in chair;with call bell/phone within reach;with chair alarm set   Nurse Communication Mobility status;Other (comment) (on muscus like BM)        Time: 3500-9381 OT Time Calculation (min): 37 min  Charges: OT General Charges $OT Visit: 1 Visit OT Treatments $Self Care/Home Management : 23-37 mins  Lodema Hong, [place] [facility]  Pager [phone] Office [place] 10/12/2021, 1:39 PM

## 2021-10-12 NOTE — Progress Notes (Signed)
[facility]  PROGRESS NOTE  Bryan [name] ZWC:[id number] DOB: [birth year] DOA: 10/02/2021 PCP: [name], FNP   Brief HPI:   75 year old male-year-old male with history of marginal zone B-cell lymphoma, BPH, OSA, hypertension, CAD, diabetes mellitus type 2, obesity, Jehovah's Witness underwent lumbar fusion with removal of hardware of L4 S1 on 11/21.  Hospital course was complicated by A. fib with RVR, confusion and fever with anemia and hemoglobin of 6.1 in last 2 days.  TRH was consulted on 11/28 for evaluation of fever and confusion.    Subjective   Patient seen, no longer febrile.  Denies dysuria, chest pain or shortness of breath.   Assessment/Plan:     Postop fever -Resolved; unclear etiology -UA is clear, chest x-ray negative, blood cultures negative to date, urine culture negative -Has been afebrile for past 24 hours -No leukocytosis -CT chest showed chronic findings -Wound is clean -Continue empiric Levaquin for 7 days  Atrial fibrillation/flutter -Patient developed postop atrial fibrillation, cardiology was consulted -He is back in normal sinus rhythm -Remains on Toprol, off IV amiodarone -Anticoagulation on hold in the setting of severe anemia -Echocardiogram shows moderately elevated pulmonary artery pressure, finding consistent with right ventricular volume overload -Cardiology following  History of CAD -Asymptomatic -Continue beta-blocker, statin  S/p lumbar fusion on 11/21 -Neurosurgery following  BPH -Has Foley catheter in place -Continue Flomax  Acute kidney injury on CKD stage IIIa -HCTZ on hold -Started on IV fluids -Creatinine improved to 1.68  Postop anemia -Iron-deficiency anemia -Hemoglobin dropped from 6.1 from 11/12 range on admission, worsened by hemodilution -Patient is Jehovah witness, adamantly declines blood products -Started on IV iron  History of rheumatoid arthritis -Continue Plaquenil -Patient also on Rituxan  as outpatient  History of B-cell lymphoma -Reportedly in remission per last oncology note   Medications     atorvastatin  20 mg Oral Daily   Chlorhexidine Gluconate Cloth  6 each Topical Daily   gabapentin  300 mg Oral BID   iron polysaccharides  150 mg Oral Daily   levofloxacin  250 mg Oral Daily   metoprolol succinate  100 mg Oral Daily   pantoprazole  40 mg Oral QHS   primidone  150 mg Oral QHS   sodium chloride flush  3 mL Intravenous Q12H   tamsulosin  0.4 mg Oral QHS     Data Reviewed:   CBG:  Recent Labs  Lab 10/09/21 0038  GLUCAP 114*    SpO2: 100 % O2 Flow Rate (L/min): 2 L/min    Vitals:   10/11/21 2150 10/11/21 2321 10/12/21 0321 10/12/21 0754  BP: 129/66 140/61 126/62 (!) 142/62  Pulse: 87 84 82 84  Resp: 20  17 17   Temp:  99.3 F (37.4 C) 98.9 F (37.2 C) 98.5 F (36.9 C)  TempSrc:  Oral Oral Oral  SpO2: 99%  100% 100%  Weight:      Height:         Intake/Output Summary (Last 24 hours) at 10/12/2021 1113 Last data filed at 10/12/2021 0357 Gross per 24 hour  Intake --  Output 700 ml  Net -700 ml    11/29 1901 - 12/01 0700 In: 692.4 [I.V.:635.7] Out: 1300 [Urine:1300]  Filed Weights   10/02/21 1041  Weight: 101.6 kg    Data Reviewed: Basic Metabolic Panel: Recent Labs  Lab 10/09/21 0226 10/10/21 0318 10/11/21 0348 10/12/21 0153  NA 133* 134* 134* 137  K 3.9 3.7 3.4* 3.7  CL 95* 98 100  104  CO2 30 27 27 24   GLUCOSE 132* 122* 119* 141*  BUN 47* 45* 42* 38*  CREATININE 2.31* 2.08* 2.09* 1.68*  CALCIUM 8.0* 8.0* 8.3* 8.3*   Liver Function Tests: Recent Labs  Lab 10/09/21 0226 10/11/21 0348  AST 88* 47*  ALT 47* 36  ALKPHOS 179* 181*  BILITOT 0.6 0.5  PROT 6.0* 5.6*  ALBUMIN 2.0* 1.9*   No results for input(s): LIPASE, AMYLASE in the last 168 hours. No results for input(s): AMMONIA in the last 168 hours. CBC: Recent Labs  Lab 10/05/21 1800 10/09/21 0226 10/10/21 0318 10/11/21 0348 10/12/21 0153  WBC 11.1*  8.3 8.1 8.0 10.0  NEUTROABS  --  6.3  --   --   --   HGB 7.3* 6.1* 6.1* 6.1* 6.1*  HCT 22.8* 18.2* 18.4* 18.7* 19.3*  MCV 97.9 94.3 95.3 96.4 97.0  PLT 202 262 271 323 377   Cardiac Enzymes: No results for input(s): CKTOTAL, CKMB, CKMBINDEX, TROPONINI in the last 168 hours. BNP (last 3 results) No results for input(s): BNP in the last 8760 hours.  ProBNP (last 3 results) No results for input(s): PROBNP in the last 8760 hours.  CBG: Recent Labs  Lab 10/09/21 0038  GLUCAP 114*       Radiology Reports  ECHOCARDIOGRAM COMPLETE  Result Date: 10/11/2021    ECHOCARDIOGRAM REPORT   Patient Name:   Bryan [name] Date of Exam: 10/11/2021 Medical Rec #:  [id number]     Height:       65.5 in Accession #:    [id number]    Weight:       224.0 lb Date of Birth:  1946-03-05    BSA:          2.088 m Patient Age:    78 years      BP:           136/89 mmHg Patient Gender: M             HR:           98 bpm. Exam Location:  Inpatient Procedure: 2D Echo, Color Doppler and Cardiac Doppler Indications:    I48.92* Unspecified atrial flutter  History:        Patient has prior history of Echocardiogram examinations, most                 recent 07/30/2018. CAD, Arrythmias:Atrial Fibrillation; Risk                 Factors:Hypertension and Sleep Apnea.  Sonographer:    Raquel Sarna Senior RDCS Referring Phys: [id number] CELESTE C CANTWELL  Sonographer Comments: Technically difficult study due to poor echo windows. IMPRESSIONS  1. Left ventricular ejection fraction, by estimation, is 55 to 60%. The left ventricle has normal function. The left ventricle has no regional wall motion abnormalities. Left ventricular diastolic parameters are indeterminate. There is the interventricular septum is flattened in diastole ('D' shaped left ventricle), consistent with right ventricular volume overload.  2. Right ventricular systolic function is normal. The right ventricular size is normal. There is moderately elevated pulmonary artery  systolic pressure.  3. The mitral valve is normal in structure. No evidence of mitral valve regurgitation. No evidence of mitral stenosis.  4. Tricuspid valve regurgitation is moderate.  5. The aortic valve is normal in structure. There is mild thickening of the aortic valve. Aortic valve regurgitation is mild. No aortic stenosis is present.  6. The inferior vena cava is normal in size  with greater than 50% respiratory variability, suggesting right atrial pressure of 3 mmHg. Comparison(s): A prior study was performed on 07/30/2018. FINDINGS  Left Ventricle: Left ventricular ejection fraction, by estimation, is 55 to 60%. The left ventricle has normal function. The left ventricle has no regional wall motion abnormalities. The left ventricular internal cavity size was normal in size. There is  no left ventricular hypertrophy. The interventricular septum is flattened in diastole ('D' shaped left ventricle), consistent with right ventricular volume overload. Left ventricular diastolic parameters are indeterminate. Right Ventricle: The right ventricular size is normal. No increase in right ventricular wall thickness. Right ventricular systolic function is normal. There is moderately elevated pulmonary artery systolic pressure. The tricuspid regurgitant velocity is 3.59 m/s, and with an assumed right atrial pressure of 8 mmHg, the estimated right ventricular systolic pressure is 76.1 mmHg. Left Atrium: Left atrial size was normal in size. Right Atrium: Right atrial size was normal in size. Pericardium: There is no evidence of pericardial effusion. Presence of epicardial fat layer. Mitral Valve: The mitral valve is normal in structure. No evidence of mitral valve regurgitation. No evidence of mitral valve stenosis. Tricuspid Valve: The tricuspid valve is normal in structure. Tricuspid valve regurgitation is moderate . No evidence of tricuspid stenosis. Aortic Valve: The aortic valve is normal in structure. There is mild  thickening of the aortic valve. There is mild aortic valve annular calcification. Aortic valve regurgitation is mild. No aortic stenosis is present. Pulmonic Valve: The pulmonic valve was normal in structure. Pulmonic valve regurgitation is not visualized. No evidence of pulmonic stenosis. Aorta: The aortic root was not well visualized. Venous: The inferior vena cava is normal in size with greater than 50% respiratory variability, suggesting right atrial pressure of 3 mmHg. IAS/Shunts: No atrial level shunt detected by color flow Doppler.  LEFT VENTRICLE PLAX 2D LVIDd:         5.10 cm LVIDs:         3.20 cm LV PW:         0.80 cm LV IVS:        1.10 cm LVOT diam:     2.00 cm LV SV:         62 LV SV Index:   30 LVOT Area:     3.14 cm  RIGHT VENTRICLE RV S prime:     10.60 cm/s TAPSE (M-mode): 2.4 cm LEFT ATRIUM             Index        RIGHT ATRIUM           Index LA diam:        3.70 cm 1.77 cm/m   RA Area:     20.10 cm LA Vol (A2C):   48.4 ml 23.18 ml/m  RA Volume:   62.70 ml  30.03 ml/m LA Vol (A4C):   32.8 ml 15.74 ml/m LA Biplane Vol: 46.9 ml 22.47 ml/m  AORTIC VALVE LVOT Vmax:         111.27 cm/s LVOT Vmean:        84.700 cm/s LVOT VTI:          0.197 m AR Vena Contracta: 0.30 cm  AORTA Ao Root diam: 3.00 cm TRICUSPID VALVE TR Peak grad:   51.6 mmHg TR Vmax:        359.00 cm/s  SHUNTS Systemic VTI:  0.20 m Systemic Diam: 2.00 cm Kardie Tobb DO Electronically signed by Berniece Salines DO Signature Date/Time: 10/11/2021/12:00:57 PM  Final        Antibiotics: Anti-infectives (From admission, onward)    Start     Dose/Rate Route Frequency Ordered Stop   10/10/21 1000  levofloxacin (LEVAQUIN) tablet 250 mg        250 mg Oral Daily 10/09/21 0811     10/09/21 1000  levofloxacin (LEVAQUIN) tablet 500 mg  Status:  Discontinued        500 mg Oral Daily 10/09/21 0758 10/09/21 0811   10/09/21 1000  levofloxacin (LEVAQUIN) tablet 500 mg        500 mg Oral Daily 10/09/21 0811 10/09/21 1116   10/02/21 2145   ceFAZolin (ANCEF) IVPB 2g/100 mL premix        2 g 200 mL/hr over 30 Minutes Intravenous Every 8 hours 10/02/21 2124 10/04/21 1353   10/02/21 1045  ceFAZolin (ANCEF) IVPB 2g/100 mL premix        2 g 200 mL/hr over 30 Minutes Intravenous On call to O.R. 10/02/21 1037 10/02/21 1300         DVT prophylaxis: SCDs    Objective    Physical Examination:   General-appears in no acute distress Heart-S1-S2, regular, no murmur auscultated Lungs-clear to auscultation bilaterally, no wheezing or crackles auscultated Abdomen-soft, nontender, no organomegaly Extremities-no edema in the lower extremities Neuro-alert, oriented x3, no focal deficit noted   COVID-19 Labs  Recent Labs    10/10/21 0920  FERRITIN 167    Lab Results  Component Value Date   SARSCOV2NAA NEGATIVE 09/28/2021   [place] NEGATIVE 12/22/2019   [place] NEGATIVE 12/17/2019            Recent Results (from the past 240 hour(s))  Urine Culture     Status: None   Collection Time: 10/08/21 10:15 AM   Specimen: Urine, Catheterized  Result Value Ref Range Status   Specimen Description URINE, CATHETERIZED  Final   Special Requests NONE  Final   Culture   Final    NO GROWTH Performed at [facility], [address], [place], [place] [address]    Report Status 10/09/2021 FINAL  Final  Culture, blood (routine x 2)     Status: None (Preliminary result)   Collection Time: 10/09/21  2:26 AM   Specimen: BLOOD  Result Value Ref Range Status   Specimen Description BLOOD LEFT ANTECUBITAL  Final   Special Requests AEROBIC BOTTLE ONLY Blood Culture adequate volume  Final   Culture   Final    NO GROWTH 3 DAYS Performed at [facility] [address] [place], [place] [address]    Report Status PENDING  Incomplete  Culture, blood (routine x 2)     Status: None (Preliminary result)   Collection Time: 10/09/21  2:26 AM   Specimen: BLOOD  Result Value Ref Range Status   Specimen Description  BLOOD SITE NOT SPECIFIED  Final   Special Requests AEROBIC BOTTLE ONLY Blood Culture adequate volume  Final   Culture   Final    NO GROWTH 3 DAYS Performed at [facility], [address], [place],  [address]    Report Status PENDING  Incomplete  Urine Culture     Status: None   Collection Time: 10/09/21 11:32 AM   Specimen: Urine, Catheterized  Result Value Ref Range Status   Specimen Description URINE, CATHETERIZED  Final   Special Requests NONE  Final   Culture   Final    NO GROWTH Performed at [facility],  [address] [place], [place] [address]    Report Status 10/10/2021 FINAL  Final    Oswald Hillock   [facility] If 7PM-7AM, please contact night-coverage at [email or web address], Office  [phone]   10/12/2021, 11:13 AM  LOS: 10 days

## 2021-10-12 NOTE — Progress Notes (Addendum)
Today Bryan [name] has complained of chest pain at 8 am and I gave him tylenol.  He was fine until 6 pm and then I gave him oxycodone. Per Dr. Rayetta  on 11/29 the amiodarone drip was discontinued on 11/28. Also this morning he had a medium bowel movement and it was just clear and white mucous. Then he had another runny bowel movement that looked yellowish brown. He said he will be going to rehab and wife will pick out a place.

## 2021-10-12 NOTE — Progress Notes (Signed)
IP rehab admissions - I met with patient at the bedside.  I then called his wife with his permission.  Wife would like inpatient rehab admission prior to home.  Wife does work but understands need for supervision for patient at home after a rehab stay.  Will begin pre authorization process with [employer] comm care insurance carrier.  Will update all once I hear back from insurance carrier.  Call for questions.  [phone]

## 2021-10-12 NOTE — Progress Notes (Signed)
Physical Therapy Treatment Patient Details Name: Bryan [name] MRN: [id number] DOB: Sep 02, 1946 Today's Date: 10/12/2021   History of Present Illness 75 yo male s/p L2-4 decompression and fusion, exploration of L4-S1 fusion, redo L5-S1 fusion on 11/22. Developed tachycardia up to 150s on 11/27, cardiology following. PMH includes OA, BPH, cancer, DM, obesity, RA, seizures, ACDF 2014, lap chole, lumbar surgeries 2016-2017.    PT Comments    Pt mostly focused on Bms during session, nervous about fecal incontinence. Pt overall requiring min-mod physical assist for mobility, and lacks insight into safety awareness and spinal precautions requiring max cues throughout mobility. Pt with x2 mucoid stools during session, RN aware. Of note, Pt with complaints of substernal and R-sided chest pressure, especially when having BM on toilet. RN aware and present, pt returned to bed for further evaluation. Will continue to follow.     Recommendations for follow up therapy are one component of a multi-disciplinary discharge planning process, led by the attending physician.  Recommendations may be updated based on patient status, additional functional criteria and insurance authorization.  Follow Up Recommendations  Skilled nursing-short term rehab (<3 hours/day)     Assistance Recommended at Discharge Frequent or constant Supervision/Assistance  Equipment Recommendations  Rolling walker (2 wheels);BSC/3in1    Recommendations for Other Services       Precautions / Restrictions Precautions Precautions: Fall;Back Precaution Comments: unable to recall precautions - states "it's not the time to remember them" Required Braces or Orthoses: Spinal Brace Spinal Brace: Lumbar corset;Applied in sitting position     Mobility  Bed Mobility Overal bed mobility: Needs Assistance Bed Mobility: Rolling;Sidelying to Sit;Sit to Sidelying Rolling: Min assist Sidelying to sit: Mod assist     Sit to sidelying: Mod  assist General bed mobility comments: min-mod assist for log roll technique to/from EOB, especially trunk elevation and LE lifting back into bed. Cues for technique throughout    Transfers Overall transfer level: Needs assistance Equipment used: Rolling walker (2 wheels) Transfers: Sit to/from Stand Sit to Stand: Min assist;From elevated surface           General transfer comment: assist for power up, steadying. STS x2, from EOB and toilet. Cues for hand placement.    Ambulation/Gait Ambulation/Gait assistance: Min assist Gait Distance (Feet): 15 Feet Assistive device: Rolling walker (2 wheels) Gait Pattern/deviations: Step-through pattern;Decreased stride length;Trunk flexed Gait velocity: decreased     General Gait Details: light steadying assist, RW management. MAX cues for upright posture and placement in RW   Stairs             Wheelchair Mobility    Modified Rankin (Stroke Patients Only)       Balance Overall balance assessment: Needs assistance Sitting-balance support: Feet supported Sitting balance-Leahy Scale: Fair     Standing balance support: Bilateral upper extremity supported;During functional activity;Reliant on assistive device for balance Standing balance-Leahy Scale: Poor                              Cognition Arousal/Alertness: Awake/alert Behavior During Therapy: Flat affect Overall Cognitive Status: Impaired/Different from baseline Area of Impairment: Memory;Attention;Following commands;Safety/judgement;Problem solving                     Memory: Decreased recall of precautions;Decreased short-term memory Following Commands: Follows one step commands with increased time Safety/Judgement: Decreased awareness of deficits;Decreased awareness of safety   Problem Solving: Slow processing;Decreased initiation;Difficulty sequencing;Requires verbal cues;Requires tactile  cues General Comments: pt requires max cues for  following commands and precautions        Exercises      General Comments General comments (skin integrity, edema, etc.): Pt with complaints of substernal and R-sided chest pressure, especially when having BM on toilet. RN aware and present, pt returned to bed for further evaluation      Pertinent Vitals/Pain Pain Assessment: Faces Faces Pain Scale: Hurts even more Pain Location: back Pain Descriptors / Indicators: Discomfort;Grimacing;Sore Pain Intervention(s): Limited activity within patient's tolerance;Monitored during session;Repositioned    Home Living                          Prior Function            PT Goals (current goals can now be found in the care plan section) Acute Rehab PT Goals Patient Stated Goal: to get stronger PT Goal Formulation: With patient Time For Goal Achievement: 10/17/21 Potential to Achieve Goals: Good Progress towards PT goals: Progressing toward goals    Frequency    Min 5X/week      PT Plan Current plan remains appropriate    Co-evaluation              AM-PAC PT "6 Clicks" Mobility   Outcome Measure  Help needed turning from your back to your side while in a flat bed without using bedrails?: A Lot Help needed moving from lying on your back to sitting on the side of a flat bed without using bedrails?: A Lot Help needed moving to and from a bed to a chair (including a wheelchair)?: A Lot Help needed standing up from a chair using your arms (e.g., wheelchair or bedside chair)?: A Little Help needed to walk in hospital room?: A Little Help needed climbing 3-5 steps with a railing? : Total 6 Click Score: 13    End of Session Equipment Utilized During Treatment: Other (comment) (brace not donned given fecal incontinence, return to supine at end of session) Activity Tolerance: Patient tolerated treatment well Patient left: in bed;with call bell/phone within reach;with bed alarm set;with nursing/sitter in room Nurse  Communication: Mobility status;Other (comment) (chest pressure) PT Visit Diagnosis: Other abnormalities of gait and mobility (R26.89);Muscle weakness (generalized) (M62.81)     Time: 0355-9741 PT Time Calculation (min) (ACUTE ONLY): 20 min  Charges:  $Gait Training: 8-22 mins                    Stacie Glaze, PT DPT Acute Rehabilitation Services Pager [phone]  Office [phone]    [place] 10/12/2021, 10:25 AM

## 2021-10-12 NOTE — TOC Initial Note (Signed)
Transition of Care [facility]) - Initial/Assessment Note    Patient Details  Name: Bryan [name] MRN: [id number] Date of Birth: 11-11-1946  Transition of Care [facility]) CM/SW Contact:    Joanne Chars, LCSW Phone Number: 10/12/2021, 4:17 PM  Clinical Narrative:   CSW meet with pt regarding recommendation for SNF.  Pt agreeable to this, choice document given.  Per pt, he does have medicare in addition to his VA benefits.  He goes to the [facility].  Pt unclear on his medicare, initially said [employer], then said [employer].  Discussed different SNF options based on VA vs medicare payer and pt states he wants to use his VA benefits, even if this means a facility out of town.  Discussed that the facilities on the medicare.gov document are not SNF facilities and pt verbalizes understanding.  Permission given to speak with wife [place].  Pt reports he is vaccinated for covid with one booster.  FL2 prepared but pt has not been faxed out in the hub.                Expected Discharge Plan: Skilled Nursing Facility Barriers to Discharge: Continued Medical Work up, SNF Pending bed offer   Patient Goals and CMS Choice Patient states their goals for this hospitalization and ongoing recovery are:: mobility CMS Medicare.gov Compare Post Acute Care list provided to:: Patient Choice offered to / list presented to : Patient  Expected Discharge Plan and Services Expected Discharge Plan: [place] In-house Referral: Clinical Social Work   Post Acute Care Choice: [place] Living arrangements for the past 2 months: [place]                                      Prior Living Arrangements/Services Living arrangements for the past 2 months: Single Family Home Lives with:: Spouse Patient language and need for interpreter reviewed:: Yes        Need for Family Participation in Patient Care: Yes (Comment) Care giver support system in place?: Yes  (comment) Current home services: Other (comment) (none) Criminal Activity/Legal Involvement Pertinent to Current Situation/Hospitalization: No - Comment as needed  Activities of Daily Living      Permission Sought/Granted Permission sought to share information with : Family Supports, Chartered certified accountant granted to share information with : Yes, Verbal Permission Granted  Share Information with NAME: wife [place]  Permission granted to share info w AGENCY: SNF        Emotional Assessment Appearance:: Appears stated age Attitude/Demeanor/Rapport: Engaged Affect (typically observed): Appropriate, Pleasant Orientation: : Oriented to Self, Oriented to Place, Oriented to  Time, Oriented to Situation Alcohol / Substance Use: Not Applicable Psych Involvement: No (comment)  Admission diagnosis:  Spinal stenosis of lumbar region [M48.061] Patient Active Problem List   Diagnosis Date Noted   Severe sepsis ([place]) 10/09/2021   New onset atrial fibrillation ([place]) 10/09/2021   Acute blood loss anemia 10/09/2021   Spinal stenosis of lumbar region 10/02/2021   Abnormal stress test 04/27/2020   Coronary artery disease involving native coronary artery of native heart without angina pectoris 04/27/2020   Irregular heart beat 04/27/2020   Spondylolisthesis at L4-L5 level 12/25/2019   DDD (degenerative disc disease), lumbar 01/16/2019   Precordial pain 07/30/2018   Elevated troponin    Marginal zone lymphoma ([place]) 02/02/2018   Lymphoma ([place]) 01/24/2018   Parotid mass  10/28/2017   Arthralgia of right lower leg 10/10/2017   Class 2 severe obesity due to excess calories with serious comorbidity and body mass index (BMI) of 39.0 to 39.9 in adult [facility] 03/07/2017   ACE-inhibitor cough 03/07/2017   Lumbago 06/21/2016   Spinal stenosis, lumbar region, with neurogenic claudication 02/15/2015   Obesity (BMI 30-39.9) 12/07/2013   Refusal of blood transfusions as patient is  Jehovah's Witness 12/07/2013   Rheumatoid arthritis ([place]) 10/26/2012   Nonspecific abnormal finding in stool contents 07/28/2012   Multiple joint pain 06/05/2012   History of tobacco use-  06/05/2012   Asthmatic bronchitis 12/15/2011   BPH (benign prostatic hyperplasia) 12/15/2011   Essential tremor 12/15/2011   Essential hypertension 12/15/2011   PCP:  [name], FNP Pharmacy:   [facility] [address], [place] - [address] [place] [place] [address] Phone: [phone] Fax: [phone]     Social Determinants of Health (SDOH) Interventions    Readmission Risk Interventions No flowsheet data found.

## 2021-10-12 NOTE — PMR Pre-admission (Shared)
PMR Admission Coordinator Pre-Admission Assessment  Patient: Bryan [name] is an 75 y.o., male MRN: [id number] DOB: 05/27/1946 Height: 5' 5.5" (166.4 cm) Weight: 101.6 kg  Insurance Information HMO:     PPO:      PCP:      IPA:      80/20:      OTHER:  [facility]     Policy#: [id number]      Subscriber: patient CM Name: Cassie      Phone#: ***     Fax#: [phone] Pre-Cert#: ***      Employer: Retired Benefits:  Phone #: [phone]     Name: Retired Eff. Date: 04/09/2018     Deduct: $0      Out of Pocket Max: $0      Life Max: N/A CIR: 100%      SNF: 100% Outpatient: 100%     Co-Pay: none Home Health: 100%      Co-Pay: none DME: 100%     Co-Pay: none Providers: in network  SECONDARY:        Policy#:       Phone#:    Development worker, community:        Phone#:    The Actuary for patients in Inpatient Rehabilitation Facilities with attached Privacy Act [place] [facility] Records was provided and verbally reviewed with: N/A  Emergency Contact Information Contact Information     Name Relation Home Work Mobile   Ferrentino,Victoria Spouse [phone]  [phone]   [place] [phone]         Current Medical History  Patient Admitting Diagnosis: L4-S1 fusion  History of Present Illness:  75 year old gentleman longstanding back and bilateral hip and leg pain previously undergone L4-S1 fusion and said progressive worsening back and bilateral leg pain with neurogenic claudication refractory to conservative treatment work-up is revealed progressive spondylosis at L2-3 and L3-4 above his previous fusion with disc herniation at L2-3 severe foraminal stenosis at both levels.  Also follow-up CT scan showed lucency around his S1 screws and possible pseudoarthrosis at L5-S1.  Due to his progression of clinical syndrome imaging findings and failed conservative treatment I recommended exploration fusion move of hardware redo  posterior lateral arthrodesis L5-S1 interbody fusions at L2-3 and L3-4 with extension of his hardware.  Extensive gone over the risks and benefits of this procedure with him as well as perioperative course expectations of outcome and alternatives to surgery and he understood and agreed to proceed forward.  Neurosurgery NP requesting consultation from [facility] for fevers/sepsis.  Patient underwent lumbar fusion surgery 7 days ago.  For the past 2 days he is having fevers.  On 11/28 went into A. fib with RVR and more confused.  Hemoglobin 6.1 on labs, no leukocytosis.  Initial lactic acid normal.  Has an indwelling Foley catheter due to urinary retention, UA pending.  Blood cultures drawn.  Chest x-ray done 2 days ago was negative for pneumonia.  Neurosurgery does not think this is postop infection.  Cardiology was consulted tonight for A. fib with RVR and patient started on IV amiodarone. Patient with a h/o marginal zone B-cell lymphoma; OSA; HTN; CAD; RA; BPH; DM; obesity; and refusal of blood products/Jehovah's witness who presented on 11/21 for lumbar spinal laminectomy/fusion.  He developed fever and confusion 2 days ago and afib with RVR last night.  Patient was seen on 11/28 and had no complaints.  He reported having had a rough night but feeling well this AM.  He was A&O x 3.  PT/OT evaluations completed with recommendations for short term rehab.  Patient's medical record from [facility] has been reviewed by the rehabilitation admission coordinator and physician.  Past Medical History  Past Medical History:  Diagnosis Date   Anemia    Arthritis    hands, knees, cervical area. Back pain. Rheumatoid arthritis- weekly injections.   BPH (benign prostatic hypertrophy)    Cancer (HCC)    Diabetes (HCC)    Essential tremor    GERD (gastroesophageal reflux disease)    reports for indigestion he uses mustard    Hearing deficit    wears hearing aids bilateral   HTN (hypertension)    Obesity    OSA  (obstructive sleep apnea)    Refusal of blood transfusions as patient is Jehovah's Witness    Rheumatoid arthritis ([place])    Right bundle branch block    history of   Seizures ([place])    AS A CHILD. only esential tremors now.   Sleep apnea    cpap - settings at 9 per patient    Ulcer     Has the patient had major surgery during 100 days prior to admission? Yes  Family History   family history includes CAD in his father; Colon cancer in his maternal grandfather; Diabetes in his brother, mother, and paternal aunt; Hypertension in his brother, father, and mother; Kidney disease in his brother; Stroke in his maternal grandmother.  Current Medications  Current Facility-Administered Medications:    0.9 %  sodium chloride infusion, 250 mL, Intravenous, Continuous, Kary Kos, MD, Last Rate: 1 mL/hr at 10/03/21 0533, 250 mL at 10/03/21 0533   acetaminophen (TYLENOL) tablet 650 mg, 650 mg, Oral, Q4H PRN, 650 mg at 10/12/21 0939 **OR** acetaminophen (TYLENOL) suppository 650 mg, 650 mg, Rectal, Q4H PRN, Kary Kos, MD   alum & mag hydroxide-simeth (MAALOX/MYLANTA) 200-200-20 MG/5ML suspension 30 mL, 30 mL, Oral, Q6H PRN, Kary Kos, MD, 30 mL at 10/10/21 2026   [EXPIRED] amiodarone (NEXTERONE PREMIX) 360-4.14 MG/200ML-% (1.8 mg/mL) IV infusion, 60 mg/hr, Intravenous, Continuous, Last Rate: 33.3 mL/hr at 10/09/21 0606, 60 mg/hr at 10/09/21 0606 **FOLLOWED BY** amiodarone (NEXTERONE PREMIX) 360-4.14 MG/200ML-% (1.8 mg/mL) IV infusion, 30 mg/hr, Intravenous, Continuous, Narcisse, Lucio Edward., MD, Stopped at 10/09/21 1546   atorvastatin (LIPITOR) tablet 20 mg, 20 mg, Oral, Daily, Karmen Bongo, MD, 20 mg at 10/12/21 0932   Chlorhexidine Gluconate Cloth 2 % PADS 6 each, 6 each, Topical, Daily, Kary Kos, MD, 6 each at 10/12/21 0934   cyclobenzaprine (FLEXERIL) tablet 10 mg, 10 mg, Oral, TID PRN, Kary Kos, MD, 10 mg at 10/08/21 1950   ferric gluconate (FERRLECIT) 250 mg in sodium chloride 0.9 % 250 mL  IVPB, 250 mg, Intravenous, Daily, Domenic Polite, MD, Last Rate: 135 mL/hr at 10/12/21 0959, 250 mg at 10/12/21 0959   gabapentin (NEURONTIN) tablet 300 mg, 300 mg, Oral, BID, Domenic Polite, MD, 300 mg at 10/12/21 0931   hydrALAZINE (APRESOLINE) injection 10 mg, 10 mg, Intravenous, Q4H PRN, Meyran, Ocie Cornfield, NP, 10 mg at 10/03/21 0001   HYDROmorphone (DILAUDID) injection 0.5 mg, 0.5 mg, Intravenous, Q2H PRN, Kary Kos, MD, 0.5 mg at 10/05/21 1642   ipratropium-albuterol (DUONEB) 0.5-2.5 (3) MG/3ML nebulizer solution 3 mL, 3 mL, Nebulization, Q4H PRN, Domenic Polite, MD, 3 mL at 10/11/21 1841   iron polysaccharides (NIFEREX) capsule 150 mg, 150 mg, Oral, Daily, Karmen Bongo, MD, 150 mg at 10/12/21 0934   levofloxacin (LEVAQUIN) tablet 250 mg, 250  mg, Oral, Daily, Kary Kos, MD, 250 mg at 10/12/21 0930   menthol-cetylpyridinium (CEPACOL) lozenge 3 mg, 1 lozenge, Oral, PRN **OR** phenol (CHLORASEPTIC) mouth spray 1 spray, 1 spray, Mouth/Throat, PRN, Kary Kos, MD   metoprolol succinate (TOPROL-XL) 24 hr tablet 100 mg, 100 mg, Oral, Daily, Kary Kos, MD, 100 mg at 10/12/21 0930   nitroGLYCERIN (NITROSTAT) SL tablet 0.4 mg, 0.4 mg, Sublingual, Q5 min PRN, Kary Kos, MD   ondansetron [facility]) tablet 4 mg, 4 mg, Oral, Q6H PRN **OR** ondansetron (ZOFRAN) injection 4 mg, 4 mg, Intravenous, Q6H PRN, Kary Kos, MD, 4 mg at 10/03/21 0530   oxyCODONE (Oxy IR/ROXICODONE) immediate release tablet 10 mg, 10 mg, Oral, Q3H PRN, Kary Kos, MD, 10 mg at 10/09/21 1151   pantoprazole (PROTONIX) EC tablet 40 mg, 40 mg, Oral, QHS, Kary Kos, MD, 40 mg at 10/11/21 2149   primidone (MYSOLINE) tablet 150 mg, 150 mg, Oral, QHS, Kary Kos, MD, 150 mg at 10/11/21 2149   sodium chloride 0.9 % bolus 500 mL, 500 mL, Intravenous, Once PRN, Meyran, Ocie Cornfield, NP   sodium chloride flush (NS) 0.9 % injection 3 mL, 3 mL, Intravenous, Q12H, Kary Kos, MD, 3 mL at 10/11/21 2200   sodium chloride flush (NS) 0.9  % injection 3 mL, 3 mL, Intravenous, PRN, Kary Kos, MD   tamsulosin [facility]) capsule 0.4 mg, 0.4 mg, Oral, QHS, Kary Kos, MD, 0.4 mg at 10/11/21 2149   traMADol-acetaminophen (ULTRACET) 37.5-325 MG per tablet 1 tablet, 1 tablet, Oral, Q6H PRN, Kary Kos, MD, 1 tablet at 10/10/21 9767  Patients Current Diet:  Diet Order             Diet regular Room service appropriate? Yes; Fluid consistency: Thin  Diet effective now                   Precautions / Restrictions Precautions Precautions: Fall, Back Precaution Booklet Issued: Yes (comment) Precaution Comments: difficulty recalling back precautions, concerned over need to use bathroom Spinal Brace: Lumbar corset, Applied in sitting position Restrictions Weight Bearing Restrictions: No   Has the patient had 2 or more falls or a fall with injury in the past year? Yes  Prior Activity Level Community (5-7x/wk): Went out daily.  Was driving.  Prior Functional Level Self Care: Did the patient need help bathing, dressing, using the toilet or eating? Independent  Indoor Mobility: Did the patient need assistance with walking from room to room (with or without device)? Independent  Stairs: Did the patient need assistance with internal or external stairs (with or without device)? Independent  Functional Cognition: Did the patient need help planning regular tasks such as shopping or remembering to take medications? Independent  Patient Information Are you of Hispanic, Latino/a,or Spanish origin?: A. No, not of Hispanic, Latino/a, or Spanish origin What is your race?: B. Black or African American Do you need or want an interpreter to communicate with a doctor or health care staff?: 0. No  Patient's Response To:  Health Literacy and Transportation Is the patient able to respond to health literacy and transportation needs?: Yes Health Literacy - How often do you need to have someone help you when you read instructions, pamphlets, or  other written material from your doctor or pharmacy?: Never In the past 12 months, has lack of transportation kept you from medical appointments or from getting medications?: No In the past 12 months, has lack of transportation kept you from meetings, work, or from getting things needed for  daily living?: No  Home Assistive Devices / Equipment Home Equipment: None  Prior Device Use: Indicate devices/aids used by the patient prior to current illness, exacerbation or injury? None of the above  Current Functional Level Cognition  Overall Cognitive Status: Impaired/Different from baseline Current Attention Level: Sustained Orientation Level: Oriented X4 Following Commands: Follows one step commands with increased time Safety/Judgement: Decreased awareness of deficits, Decreased awareness of safety General Comments: required cues for safety with mobilty and transfers    Extremity Assessment (includes Sensation/Coordination)  Upper Extremity Assessment: Generalized weakness  Lower Extremity Assessment: Generalized weakness    ADLs  Overall ADL's : Needs assistance/impaired Eating/Feeding: Minimal assistance Grooming: Oral care, Wash/dry hands, Wash/dry face, Sitting, Standing, Moderate assistance Grooming Details (indicate cue type and reason): performed at sink with patient standing for oral care and seated for hand and face hygiene Upper Body Bathing: Maximal assistance Lower Body Bathing: Maximal assistance Upper Body Dressing : Minimal assistance Upper Body Dressing Details (indicate cue type and reason): pt don brace at eob with positioning of brace behind patient only. pt was close and pull strings Lower Body Dressing: Maximal assistance Lower Body Dressing Details (indicate cue type and reason): noted to have edema at ankles with socks Toilet Transfer: Moderate assistance, BSC/3in1, Rolling walker (2 wheels) Toilet Transfer Details (indicate cue type and reason): required cues to  reach back to sit and push up to stand Toileting- Clothing Manipulation and Hygiene: Maximal assistance, Sit to/from stand Toileting - Clothing Manipulation Details (indicate cue type and reason): patient stood with RW during toilet hygiene Functional mobility during ADLs: Moderate assistance, Rolling walker (2 wheels) General ADL Comments: patient with muscus like discharge for BM and gas    Mobility  Overal bed mobility: Needs Assistance Bed Mobility: Rolling, Sidelying to Sit, Sit to Sidelying Rolling: Min assist Sidelying to sit: Mod assist Supine to sit: Min assist Sit to supine: Min assist Sit to sidelying: Mod assist General bed mobility comments: patient concern over going to bathroom made it difficult to follow directions for log roling technique    Transfers  Overall transfer level: Needs assistance Equipment used: Rolling walker (2 wheels) Transfers: Sit to/from Stand Sit to Stand: Min assist, From elevated surface General transfer comment: min assist to power up from surfaces with cues for hand placement    Ambulation / Gait / Stairs / Wheelchair Mobility  Ambulation/Gait Ambulation/Gait assistance: Herbalist (Feet): 15 Feet Assistive device: Rolling walker (2 wheels) Gait Pattern/deviations: Step-through pattern, Decreased stride length, Trunk flexed General Gait Details: light steadying assist, RW management. MAX cues for upright posture and placement in RW Gait velocity: decreased Gait velocity interpretation: 1.31 - 2.62 ft/sec, indicative of limited community ambulator    Posture / Balance Dynamic Sitting Balance Sitting balance - Comments: able to static sit on EOB Balance Overall balance assessment: Needs assistance Sitting-balance support: Feet supported Sitting balance-Leahy Scale: Fair Sitting balance - Comments: able to static sit on EOB Standing balance support: Bilateral upper extremity supported, During functional activity, Reliant on  assistive device for balance Standing balance-Leahy Scale: Poor Standing balance comment: reliant on RW or sink for support    Special needs/care consideration Skin Post op back dressing in place.   Previous Home Environment (from acute therapy documentation) Living Arrangements: Spouse/significant other Available Help at Discharge: Family, Available 24 hours/day Type of Home: House Home Layout: One level Home Access: Stairs to enter [employer] of Steps: 1 Bathroom Shower/Tub: Government social research officer Accessibility:  Yes Additional Comments: wife works and can flex her work schedule some. pt reports wife is off friday - sunday. but for the most part will be home alone  Discharge Living Setting Plans for Discharge Living Setting: House, Lives with (comment) (Lives with wife.) Type of Home at Discharge: House Discharge Home Layout: One level Discharge Home Access: Stairs to enter Entrance Stairs-Number of Steps: 1 step at back entry Discharge Bathroom Shower/Tub: Tub/shower unit, Curtain Discharge Bathroom Toilet: Handicapped height Discharge Bathroom Accessibility: Yes How Accessible: Accessible via walker  Social/Family/Support Systems Patient Roles: Spouse (Has a wife.) Contact Information: Shaquon Gropp - [phone] Anticipated Caregiver: wife and family Ability/Limitations of Caregiver: Wife says she will get 24/7 support for patient after rehab discharge Caregiver Availability: 24/7 Discharge Plan Discussed with Primary Caregiver: Yes Is Caregiver In Agreement with Plan?: Yes Does Caregiver/Family have Issues with Lodging/Transportation while Pt is in Rehab?: No  Goals Patient/Family Goal for Rehab: PT/OT supervision goals Expected length of stay: 10-12 days Pt/Family Agrees to Admission and willing to participate: Yes Program Orientation Provided & Reviewed with Pt/Caregiver Including Roles  & Responsibilities: Yes  Decrease  burden of Care through IP rehab admission: N/A  Possible need for SNF placement upon discharge: Not anticipated  Patient Condition: I have reviewed medical records from [facility], spoken with CM, and patient and spouse. I met with patient at the bedside and discussed via phone for inpatient rehabilitation assessment.  Patient will benefit from ongoing PT and OT, can actively participate in 3 hours of therapy a day 5 days of the week, and can make measurable gains during the admission.  Patient will also benefit from the coordinated team approach during an Inpatient Acute Rehabilitation admission.  The patient will receive intensive therapy as well as Rehabilitation physician, nursing, social worker, and care management interventions.  Due to bladder management, bowel management, safety, skin/wound care, disease management, medication administration, pain management, and patient education the patient requires 24 hour a day rehabilitation nursing.  The patient is currently *** with mobility and basic ADLs.  Discharge setting and therapy post discharge at home with home health is anticipated.  Patient has agreed to participate in the Acute Inpatient Rehabilitation Program and will admit {Time; today/tomorrow:10263}.  Preadmission Screen Completed By:  Retta Diones, 10/12/2021 3:39 PM ______________________________________________________________________   Discussed status with Dr. [name] on *** at *** and received approval for admission today.  Admission Coordinator:  Retta Diones, RN, time Marland KitchenSudie Grumbling ***   Assessment/Plan: Diagnosis: Does the need for close, 24 hr/day Medical supervision in concert with the patient's rehab needs make it unreasonable for this patient to be served in a less intensive setting? {yes_no_potentially:3041433} Co-Morbidities requiring supervision/potential complications: *** Due to {due SK:8768115}, does the patient require 24 hr/day rehab nursing?  {yes_no_potentially:3041433} Does the patient require coordinated care of a physician, rehab nurse, PT, OT, and SLP to address physical and functional deficits in the context of the above medical diagnosis(es)? {yes_no_potentially:3041433} Addressing deficits in the following areas: {deficits:3041436} Can the patient actively participate in an intensive therapy program of at least 3 hrs of therapy 5 days a week? {yes_no_potentially:3041433} The potential for patient to make measurable gains while on inpatient rehab is {potential:3041437} Anticipated functional outcomes upon discharge from inpatient rehab: {functional outcomes:304600100} PT, {functional outcomes:304600100} OT, {functional outcomes:304600100} SLP Estimated rehab length of stay to reach the above functional goals is: *** Anticipated discharge destination: {anticipated dc setting:21604} 10. Overall Rehab/Functional Prognosis: {potential:3041437}   MD Signature: ***

## 2021-10-12 NOTE — Progress Notes (Signed)
Subjective: Patient reports  feeling better today  Objective: Vital signs in last 24 hours: Temp:  [98.1 F (36.7 C)-100.1 F (37.8 C)] 98.9 F (37.2 C) (12/01 0321) Pulse Rate:  [82-105] 82 (12/01 0321) Resp:  [17-24] 17 (12/01 0321) BP: (91-140)/(59-89) 126/62 (12/01 0321) SpO2:  [95 %-100 %] 100 % (12/01 0321)  Intake/Output from previous day: 11/30 0701 - 12/01 0700 In: -  Out: 700 [Urine:700] Intake/Output this shift: No intake/output data recorded.  Strength 5 out of 5 incision clean dry and intact  Lab Results: Recent Labs    10/11/21 0348 10/12/21 0153  WBC 8.0 10.0  HGB 6.1* 6.1*  HCT 18.7* 19.3*  PLT 323 377   BMET Recent Labs    10/11/21 0348 10/12/21 0153  NA 134* 137  K 3.4* 3.7  CL 100 104  CO2 27 24  GLUCOSE 119* 141*  BUN 42* 38*  CREATININE 2.09* 1.68*  CALCIUM 8.3* 8.3*    Studies/Results: ECHOCARDIOGRAM COMPLETE  Result Date: 10/11/2021    ECHOCARDIOGRAM REPORT   Patient Name:   EDILSON VITAL Date of Exam: 10/11/2021 Medical Rec #:  [id number]     Height:       65.5 in Accession #:    [id number]    Weight:       224.0 lb Date of Birth:  11/05/1946    BSA:          2.088 m Patient Age:    75 years      BP:           136/89 mmHg Patient Gender: M             HR:           98 bpm. Exam Location:  Inpatient Procedure: 2D Echo, Color Doppler and Cardiac Doppler Indications:    I48.92* Unspecified atrial flutter  History:        Patient has prior history of Echocardiogram examinations, most                 recent 07/30/2018. CAD, Arrythmias:Atrial Fibrillation; Risk                 Factors:Hypertension and Sleep Apnea.  Sonographer:    Raquel Sarna Senior RDCS Referring Phys: [id number] CELESTE C CANTWELL  Sonographer Comments: Technically difficult study due to poor echo windows. IMPRESSIONS  1. Left ventricular ejection fraction, by estimation, is 55 to 60%. The left ventricle has normal function. The left ventricle has no regional wall motion abnormalities.  Left ventricular diastolic parameters are indeterminate. There is the interventricular septum is flattened in diastole ('D' shaped left ventricle), consistent with right ventricular volume overload.  2. Right ventricular systolic function is normal. The right ventricular size is normal. There is moderately elevated pulmonary artery systolic pressure.  3. The mitral valve is normal in structure. No evidence of mitral valve regurgitation. No evidence of mitral stenosis.  4. Tricuspid valve regurgitation is moderate.  5. The aortic valve is normal in structure. There is mild thickening of the aortic valve. Aortic valve regurgitation is mild. No aortic stenosis is present.  6. The inferior vena cava is normal in size with greater than 50% respiratory variability, suggesting right atrial pressure of 3 mmHg. Comparison(s): A prior study was performed on 07/30/2018. FINDINGS  Left Ventricle: Left ventricular ejection fraction, by estimation, is 55 to 60%. The left ventricle has normal function. The left ventricle has no regional wall motion abnormalities. The left ventricular internal cavity size  was normal in size. There is  no left ventricular hypertrophy. The interventricular septum is flattened in diastole ('D' shaped left ventricle), consistent with right ventricular volume overload. Left ventricular diastolic parameters are indeterminate. Right Ventricle: The right ventricular size is normal. No increase in right ventricular wall thickness. Right ventricular systolic function is normal. There is moderately elevated pulmonary artery systolic pressure. The tricuspid regurgitant velocity is 3.59 m/s, and with an assumed right atrial pressure of 8 mmHg, the estimated right ventricular systolic pressure is 57.8 mmHg. Left Atrium: Left atrial size was normal in size. Right Atrium: Right atrial size was normal in size. Pericardium: There is no evidence of pericardial effusion. Presence of epicardial fat layer. Mitral Valve:  The mitral valve is normal in structure. No evidence of mitral valve regurgitation. No evidence of mitral valve stenosis. Tricuspid Valve: The tricuspid valve is normal in structure. Tricuspid valve regurgitation is moderate . No evidence of tricuspid stenosis. Aortic Valve: The aortic valve is normal in structure. There is mild thickening of the aortic valve. There is mild aortic valve annular calcification. Aortic valve regurgitation is mild. No aortic stenosis is present. Pulmonic Valve: The pulmonic valve was normal in structure. Pulmonic valve regurgitation is not visualized. No evidence of pulmonic stenosis. Aorta: The aortic root was not well visualized. Venous: The inferior vena cava is normal in size with greater than 50% respiratory variability, suggesting right atrial pressure of 3 mmHg. IAS/Shunts: No atrial level shunt detected by color flow Doppler.  LEFT VENTRICLE PLAX 2D LVIDd:         5.10 cm LVIDs:         3.20 cm LV PW:         0.80 cm LV IVS:        1.10 cm LVOT diam:     2.00 cm LV SV:         62 LV SV Index:   30 LVOT Area:     3.14 cm  RIGHT VENTRICLE RV S prime:     10.60 cm/s TAPSE (M-mode): 2.4 cm LEFT ATRIUM             Index        RIGHT ATRIUM           Index LA diam:        3.70 cm 1.77 cm/m   RA Area:     20.10 cm LA Vol (A2C):   48.4 ml 23.18 ml/m  RA Volume:   62.70 ml  30.03 ml/m LA Vol (A4C):   32.8 ml 15.74 ml/m LA Biplane Vol: 46.9 ml 22.47 ml/m  AORTIC VALVE LVOT Vmax:         111.27 cm/s LVOT Vmean:        84.700 cm/s LVOT VTI:          0.197 m AR Vena Contracta: 0.30 cm  AORTA Ao Root diam: 3.00 cm TRICUSPID VALVE TR Peak grad:   51.6 mmHg TR Vmax:        359.00 cm/s  SHUNTS Systemic VTI:  0.20 m Systemic Diam: 2.00 cm Kardie Tobb DO Electronically signed by Berniece Salines DO Signature Date/Time: 10/11/2021/12:00:57 PM    Final     Assessment/Plan: Postop day 1 lumbar fusion.  Postoperative atrial fibrillation improving the patient is responding well to physical therapy  and nursing to exclude cleared from cardiology Recommend consult  LOS: 10 days     Elaina Hoops 10/12/2021, 7:50 AM

## 2021-10-12 NOTE — NC FL2 (Signed)
[place] LEVEL OF CARE SCREENING TOOL     IDENTIFICATION  Patient Name: Bryan [name] Birthdate: Dec 22, 1945 Sex: male Admission Date (Current Location): 10/02/2021  [facility] and [place] Number:  Herbalist and Address:  [facility] [place], [place] [address], [place], [place] [address]      Provider Number: [id number]  Attending Physician Name and Address:  Kary Kos, MD  Relative Name and Phone Number:  An, Schnabel [phone]  [phone]    Current Level of Care: Hospital Recommended Level of Care: [place] Prior Approval Number:    Date Approved/Denied:   PASRR Number: [id number]  Discharge Plan: SNF    Current Diagnoses: Patient Active Problem List   Diagnosis Date Noted   Severe sepsis ([place]) 10/09/2021   New onset atrial fibrillation ([place]) 10/09/2021   Acute blood loss anemia 10/09/2021   Spinal stenosis of lumbar region 10/02/2021   Abnormal stress test 04/27/2020   Coronary artery disease involving native coronary artery of native heart without angina pectoris 04/27/2020   Irregular heart beat 04/27/2020   Spondylolisthesis at L4-L5 level 12/25/2019   DDD (degenerative disc disease), lumbar 01/16/2019   Precordial pain 07/30/2018   Elevated troponin    Marginal zone lymphoma ([place]) 02/02/2018   Lymphoma ([place]) 01/24/2018   Parotid mass 10/28/2017   Arthralgia of right lower leg 10/10/2017   Class 2 severe obesity due to excess calories with serious comorbidity and body mass index (BMI) of 39.0 to 39.9 in adult [facility]) 03/07/2017   ACE-inhibitor cough 03/07/2017   Lumbago 06/21/2016   Spinal stenosis, lumbar region, with neurogenic claudication 02/15/2015   Obesity (BMI 30-39.9) 12/07/2013   Refusal of blood transfusions as patient is Jehovah's Witness 12/07/2013   Rheumatoid arthritis ([place]) 10/26/2012   Nonspecific abnormal finding in stool contents 07/28/2012   Multiple joint pain  06/05/2012   History of tobacco use-  06/05/2012   Asthmatic bronchitis 12/15/2011   BPH (benign prostatic hyperplasia) 12/15/2011   Essential tremor 12/15/2011   Essential hypertension 12/15/2011    Orientation RESPIRATION BLADDER Height & Weight     Self, Time, Situation, Place  Normal Incontinent, External catheter Weight: 224 lb (101.6 kg) Height:  5' 5.5" (166.4 cm)  BEHAVIORAL SYMPTOMS/MOOD NEUROLOGICAL BOWEL NUTRITION STATUS      Incontinent Diet (see discharge summary)  AMBULATORY STATUS COMMUNICATION OF NEEDS Skin   Limited Assist Verbally Surgical wounds                       Personal Care Assistance Level of Assistance  Bathing, Feeding, Dressing Bathing Assistance: Limited assistance Feeding assistance: Independent Dressing Assistance: Limited assistance     Functional Limitations Info  Sight, Hearing, Speech Sight Info: Adequate Hearing Info: Adequate Speech Info: Adequate    SPECIAL CARE FACTORS FREQUENCY  PT (By licensed PT), OT (By licensed OT)     PT Frequency: 5x week OT Frequency: 5x week            Contractures Contractures Info: Not present    Additional Factors Info  Code Status, Allergies Code Status Info: full Allergies Info: Adalimumab, Ampicillin, Terazosin, Lisinopril, Other           Current Medications (10/12/2021):  This is the current hospital active medication list Current Facility-Administered Medications  Medication Dose Route Frequency Provider Last Rate Last Admin   0.9 %  sodium chloride infusion  250 mL Intravenous Continuous Kary Kos, MD 1 mL/hr at 10/03/21  0533 250 mL at 10/03/21 0533   acetaminophen (TYLENOL) tablet 650 mg  650 mg Oral Q4H PRN Kary Kos, MD   650 mg at 10/12/21 0092   Or   acetaminophen (TYLENOL) suppository 650 mg  650 mg Rectal Q4H PRN Kary Kos, MD       alum & mag hydroxide-simeth (MAALOX/MYLANTA) 200-200-20 MG/5ML suspension 30 mL  30 mL Oral Q6H PRN Kary Kos, MD   30 mL at 10/10/21  2026   amiodarone (NEXTERONE PREMIX) 360-4.14 MG/200ML-% (1.8 mg/mL) IV infusion  30 mg/hr Intravenous Continuous Doyne Keel., MD   Stopped at 10/09/21 1546   atorvastatin (LIPITOR) tablet 20 mg  20 mg Oral Daily Karmen Bongo, MD   20 mg at 10/12/21 0932   Chlorhexidine Gluconate Cloth 2 % PADS 6 each  6 each Topical Daily Kary Kos, MD   6 each at 10/12/21 0934   cyclobenzaprine (FLEXERIL) tablet 10 mg  10 mg Oral TID PRN Kary Kos, MD   10 mg at 10/08/21 1950   ferric gluconate (FERRLECIT) 250 mg in sodium chloride 0.9 % 250 mL IVPB  250 mg Intravenous Daily Domenic Polite, MD 135 mL/hr at 10/12/21 0959 250 mg at 10/12/21 0959   gabapentin (NEURONTIN) tablet 300 mg  300 mg Oral BID Domenic Polite, MD   300 mg at 10/12/21 0931   hydrALAZINE (APRESOLINE) injection 10 mg  10 mg Intravenous Q4H PRN Eleonore Chiquito, NP   10 mg at 10/03/21 0001   HYDROmorphone (DILAUDID) injection 0.5 mg  0.5 mg Intravenous Q2H PRN Kary Kos, MD   0.5 mg at 10/05/21 1642   ipratropium-albuterol (DUONEB) 0.5-2.5 (3) MG/3ML nebulizer solution 3 mL  3 mL Nebulization Q4H PRN Domenic Polite, MD   3 mL at 10/11/21 1841   iron polysaccharides (NIFEREX) capsule 150 mg  150 mg Oral Daily Karmen Bongo, MD   150 mg at 10/12/21 0934   levofloxacin (LEVAQUIN) tablet 250 mg  250 mg Oral Daily Kary Kos, MD   250 mg at 10/12/21 0930   menthol-cetylpyridinium (CEPACOL) lozenge 3 mg  1 lozenge Oral PRN Kary Kos, MD       Or   phenol (CHLORASEPTIC) mouth spray 1 spray  1 spray Mouth/Throat PRN Kary Kos, MD       metoprolol succinate (TOPROL-XL) 24 hr tablet 100 mg  100 mg Oral Daily Kary Kos, MD   100 mg at 10/12/21 0930   nitroGLYCERIN (NITROSTAT) SL tablet 0.4 mg  0.4 mg Sublingual Q5 min PRN Kary Kos, MD       ondansetron [facility]) tablet 4 mg  4 mg Oral Q6H PRN Kary Kos, MD       Or   ondansetron [facility]) injection 4 mg  4 mg Intravenous Q6H PRN Kary Kos, MD   4 mg at 10/03/21 0530    oxyCODONE (Oxy IR/ROXICODONE) immediate release tablet 10 mg  10 mg Oral Q3H PRN Kary Kos, MD   10 mg at 10/09/21 1151   pantoprazole (PROTONIX) EC tablet 40 mg  40 mg Oral QHS Kary Kos, MD   40 mg at 10/11/21 2149   primidone (MYSOLINE) tablet 150 mg  150 mg Oral QHS Kary Kos, MD   150 mg at 10/11/21 2149   sodium chloride 0.9 % bolus 500 mL  500 mL Intravenous Once PRN Meyran, Ocie Cornfield, NP       sodium chloride flush (NS) 0.9 % injection 3 mL  3 mL Intravenous Q12H Kary Kos, MD  3 mL at 10/12/21 1100   sodium chloride flush (NS) 0.9 % injection 3 mL  3 mL Intravenous PRN Kary Kos, MD       tamsulosin [facility]) capsule 0.4 mg  0.4 mg Oral QHS Kary Kos, MD   0.4 mg at 10/11/21 2149   traMADol-acetaminophen (ULTRACET) 37.5-325 MG per tablet 1 tablet  1 tablet Oral Q6H PRN Kary Kos, MD   1 tablet at 10/10/21 9833     Discharge Medications: Please see discharge summary for a list of discharge medications.  Relevant Imaging Results:  Relevant Lab Results:   Additional Information SSN: [id number].  Pt is vaccinated for covid with one booster.  Joanne Chars, LCSW

## 2021-10-13 LAB — CBC
HCT: 19 % — ABNORMAL LOW (ref 39.0–52.0)
Hemoglobin: 6.2 g/dL — CL (ref 13.0–17.0)
MCH: 31 pg (ref 26.0–34.0)
MCHC: 32.6 g/dL (ref 30.0–36.0)
MCV: 95 fL (ref 80.0–100.0)
Platelets: 346 10*3/uL (ref 150–400)
RBC: 2 MIL/uL — ABNORMAL LOW (ref 4.22–5.81)
RDW: 13.8 % (ref 11.5–15.5)
WBC: 10 10*3/uL (ref 4.0–10.5)
nRBC: 0.5 % — ABNORMAL HIGH (ref 0.0–0.2)

## 2021-10-13 LAB — BASIC METABOLIC PANEL
Anion gap: 9 (ref 5–15)
BUN: 34 mg/dL — ABNORMAL HIGH (ref 8–23)
CO2: 25 mmol/L (ref 22–32)
Calcium: 8.2 mg/dL — ABNORMAL LOW (ref 8.9–10.3)
Chloride: 106 mmol/L (ref 98–111)
Creatinine, Ser: 1.69 mg/dL — ABNORMAL HIGH (ref 0.61–1.24)
GFR, Estimated: 42 mL/min — ABNORMAL LOW (ref >60)
Glucose, Bld: 105 mg/dL — ABNORMAL HIGH (ref 70–99)
Potassium: 3.4 mmol/L — ABNORMAL LOW (ref 3.5–5.1)
Sodium: 140 mmol/L (ref 135–145)

## 2021-10-13 MED ORDER — POTASSIUM CHLORIDE CRYS ER 20 MEQ PO TBCR
40.0000 meq | EXTENDED_RELEASE_TABLET | Freq: Once | ORAL | Status: AC
  Administered 2021-10-13: 40 meq via ORAL
  Filled 2021-10-13: qty 2

## 2021-10-13 MED ORDER — METOPROLOL TARTRATE 5 MG/5ML IV SOLN
5.0000 mg | Freq: Once | INTRAVENOUS | Status: AC
  Administered 2021-10-13: 5 mg via INTRAVENOUS
  Filled 2021-10-13: qty 5

## 2021-10-13 MED ORDER — METOPROLOL TARTRATE 5 MG/5ML IV SOLN: 5.0000 mg | INTRAVENOUS | Status: DC | PRN

## 2021-10-13 NOTE — Progress Notes (Addendum)
7 RN noticed pt. Was in afib rhythm, had just converted (confirmed with tele), Dr. Kennon Holter on call notified for orders....will continue to monitor pt.   0518 Dr. Kennon Holter referred RN to cardiology, Dr. Alfred Levins paged. Awaiting orders   0525 Dr. Alfred Levins returned call, verbally told RN he would put in order for IV push Amio; order received for metoprolol   0551 administered 5mg  IV metoprolol   0556 Afib RVR   0559 NSR with PAC

## 2021-10-13 NOTE — Progress Notes (Signed)
Physical Therapy Treatment Patient Details Name: Bryan [name] MRN: [id number] DOB: 1946-03-28 Today's Date: 10/13/2021   History of Present Illness 75 yo male s/p L2-4 decompression and fusion, exploration of L4-S1 fusion, redo L5-S1 fusion on 11/22. Developed tachycardia up to 150s on 11/27, cardiology following. PMH includes OA, BPH, cancer, DM, obesity, RA, seizures, ACDF 2014, lap chole, lumbar surgeries 2016-2017.    PT Comments    Pt pleasant and agreeable to OOB mobility, ambulatory for hallway distance with use of RW and overall less physical assist. Pt with better HR control today, with maxHR 113 bpm. Pt progressing well, SNF remains appropriate.      Recommendations for follow up therapy are one component of a multi-disciplinary discharge planning process, led by the attending physician.  Recommendations may be updated based on patient status, additional functional criteria and insurance authorization.  Follow Up Recommendations  Skilled nursing-short term rehab (<3 hours/day)     Assistance Recommended at Discharge Frequent or constant Supervision/Assistance  Equipment Recommendations  Rolling walker (2 wheels);BSC/3in1    Recommendations for Other Services       Precautions / Restrictions Precautions Precautions: Fall;Back Precaution Booklet Issued: Yes (comment) Precaution Comments: pt able to state 3/3 precautions with cues "your BLT rules" Required Braces or Orthoses: Spinal Brace Spinal Brace: Lumbar corset;Applied in sitting position     Mobility  Bed Mobility Overal bed mobility: Needs Assistance Bed Mobility: Rolling;Sidelying to Sit Rolling: Min assist Sidelying to sit: Min assist       General bed mobility comments: assist for log roll , cues for sequencing    Transfers Overall transfer level: Needs assistance Equipment used: Rolling walker (2 wheels) Transfers: Sit to/from Stand Sit to Stand: Min assist           General transfer  comment: assist for power up and rise    Ambulation/Gait Ambulation/Gait assistance: Min guard;+2 safety/equipment Gait Distance (Feet): 65 Feet Assistive device: Rolling walker (2 wheels) Gait Pattern/deviations: Step-through pattern;Decreased stride length;Trunk flexed Gait velocity: decr     General Gait Details: for safety, MAX cues for upright posture and placement in RW   Stairs             Wheelchair Mobility    Modified Rankin (Stroke Patients Only)       Balance Overall balance assessment: Needs assistance Sitting-balance support: Feet supported Sitting balance-Leahy Scale: Fair Sitting balance - Comments: able to static sit on EOB   Standing balance support: Bilateral upper extremity supported;During functional activity;Reliant on assistive device for balance Standing balance-Leahy Scale: Poor                              Cognition Arousal/Alertness: Awake/alert Behavior During Therapy: WFL for tasks assessed/performed Overall Cognitive Status: Impaired/Different from baseline Area of Impairment: Memory;Following commands;Safety/judgement;Problem solving;Attention                   Current Attention Level: Selective Memory: Decreased recall of precautions;Decreased short-term memory Following Commands: Follows one step commands with increased time Safety/Judgement: Decreased awareness of deficits;Decreased awareness of safety   Problem Solving: Difficulty sequencing;Requires verbal cues;Requires tactile cues          Exercises      General Comments General comments (skin integrity, edema, etc.): HRmax 113 bpm during session      Pertinent Vitals/Pain Pain Assessment: Faces Faces Pain Scale: Hurts little more Pain Location: back Pain Descriptors / Indicators: Discomfort;Grimacing;Sore Pain Intervention(s):  Limited activity within patient's tolerance;Monitored during session;Repositioned    Home Living                           Prior Function            PT Goals (current goals can now be found in the care plan section) Acute Rehab PT Goals Patient Stated Goal: to get stronger PT Goal Formulation: With patient Time For Goal Achievement: 10/17/21 Potential to Achieve Goals: Good Progress towards PT goals: Progressing toward goals    Frequency    Min 5X/week      PT Plan Current plan remains appropriate    Co-evaluation              AM-PAC PT "6 Clicks" Mobility   Outcome Measure  Help needed turning from your back to your side while in a flat bed without using bedrails?: A Little Help needed moving from lying on your back to sitting on the side of a flat bed without using bedrails?: A Lot Help needed moving to and from a bed to a chair (including a wheelchair)?: A Lot Help needed standing up from a chair using your arms (e.g., wheelchair or bedside chair)?: A Little Help needed to walk in hospital room?: A Little Help needed climbing 3-5 steps with a railing? : Total 6 Click Score: 14    End of Session Equipment Utilized During Treatment: Back brace Activity Tolerance: Patient tolerated treatment well Patient left: with call bell/phone within reach;in chair;with chair alarm set Nurse Communication: Mobility status PT Visit Diagnosis: Other abnormalities of gait and mobility (R26.89);Muscle weakness (generalized) (M62.81)     Time: 1115-1140 PT Time Calculation (min) (ACUTE ONLY): 25 min  Charges:  $Gait Training: 8-22 mins $Therapeutic Activity: 8-22 mins                     Stacie Glaze, PT DPT Acute Rehabilitation Services Pager [phone]  Office [phone]    [place] 10/13/2021, 1:27 PM

## 2021-10-13 NOTE — Progress Notes (Signed)
Inpatient Rehab Admissions Coordinator:  Awaiting insurance authorization. Will continue to follow.   Gayland Curry, [place], [place] Admissions Coordinator [phone]

## 2021-10-13 NOTE — Progress Notes (Signed)
Inpatient Rehab Admissions Coordinator:   Notified by Rubin Payor with [facility] that medical director did not feel patient was medically stable for CIR given pt went into Afib this morning and was requiring IV rate control.  Lauren to continue follow up.   Shann Medal, PT, DPT Admissions Coordinator [phone] 10/13/21  4:25 PM

## 2021-10-13 NOTE — TOC Progression Note (Signed)
Transition of Care [facility]) - Progression Note    Patient Details  Name: Bryan [name] MRN: [id number] Date of Birth: May 19, 1946  Transition of Care [facility]) CM/SW [place], [place] Phone Number: 10/13/2021, 11:41 AM  Clinical Narrative:     CSW noted that CIR is following and requesting pre- auth through the [place]. If CIR cannot get authorization, Pt is 100% service connected. Pt's [facility] MD is Dr. Daiva Huge, and Social Worker is [place] [phone], [phone]. [phone]) who can assist with SNF placement. TOC will continue to follow for further needs.  Expected Discharge Plan: [place] Barriers to Discharge: Continued Medical Work up, SNF Pending bed offer  Expected Discharge Plan and Services Expected Discharge Plan: [place] In-house Referral: Clinical Social Work   Post Acute Care Choice: [place] Living arrangements for the past 2 months: Single Family Home                                       Social Determinants of Health (SDOH) Interventions    Readmission Risk Interventions No flowsheet data found.

## 2021-10-13 NOTE — Progress Notes (Signed)
[facility]  PROGRESS NOTE  Bryan [name] LXB:[id number] DOB: 09-22-1946 DOA: 10/02/2021 PCP: [name], FNP   Brief HPI:   75 year old male-year-old male with history of marginal zone B-cell lymphoma, BPH, OSA, hypertension, CAD, diabetes mellitus type 2, obesity, Jehovah's Witness underwent lumbar fusion with removal of hardware of L4 S1 on 11/21.  Hospital course was complicated by A. fib with RVR, confusion and fever with anemia and hemoglobin of 6.1 in last 2 days.  TRH was consulted on 11/28 for evaluation of fever and confusion.    Subjective   Patient seen and examined, was given IV metoprolol 5 mg x1 last night for A. fib with RVR.  EKG obtained this morning only shows sinus rhythm with premature atrial complexes.  Patient also complains of  chest discomfort last night.  Denies chest pain at this time.   Assessment/Plan:     Postop fever -Resolved; unclear etiology -UA is clear, chest x-ray negative, blood cultures negative to date, urine culture negative -Has been afebrile for past 24 hours -No leukocytosis -CT chest showed chronic findings -Wound is clean -Continue empiric Levaquin for 7 days  Atrial fibrillation/flutter -Patient developed postop atrial fibrillation, cardiology was consulted -He is back in normal sinus rhythm -Remains on Toprol, off IV amiodarone -Anticoagulation on hold in the setting of severe anemia -Echocardiogram shows moderately elevated pulmonary artery pressure, finding consistent with right ventricular volume overload -Cardiology following  History of CAD -Asymptomatic -Continue beta-blocker, statin  S/p lumbar fusion on 11/21 -Neurosurgery following  BPH -Has Foley catheter in place -Continue Flomax  Acute kidney injury on CKD stage IIIa -HCTZ on hold -Started on IV fluids -Creatinine improved to 1.68  Postop anemia -Iron-deficiency anemia -Hemoglobin dropped from 6.1 from 11/12 range on admission, worsened by  hemodilution -Patient is Jehovah witness, adamantly declines blood products -Continue iron polysaccharide 150 mg p.o. daily  Hypokalemia -Potassium is 3.4 this morning -We will replace potassium and follow BMP in am  History of rheumatoid arthritis -Continue Plaquenil -Patient also on Rituxan as outpatient  History of B-cell lymphoma -Reportedly in remission per last oncology note  Patient will benefit from going to inpatient rehab  Medications     atorvastatin  20 mg Oral Daily   Chlorhexidine Gluconate Cloth  6 each Topical Daily   gabapentin  300 mg Oral BID   iron polysaccharides  150 mg Oral Daily   levofloxacin  250 mg Oral Daily   metoprolol succinate  100 mg Oral Daily   pantoprazole  40 mg Oral QHS   primidone  150 mg Oral QHS   sodium chloride flush  3 mL Intravenous Q12H   tamsulosin  0.4 mg Oral QHS     Data Reviewed:   CBG:  Recent Labs  Lab 10/09/21 0038  GLUCAP 114*    SpO2: 99 % O2 Flow Rate (L/min): 2 L/min    Vitals:   10/13/21 0530 10/13/21 0734 10/13/21 1214 10/13/21 1500  BP:  139/66 (!) 136/51 (!) 157/58  Pulse: (!) 134 92 90 83  Resp: (!) 25 15 18 19   Temp:  99.6 F (37.6 C) 98.6 F (37 C) 98.5 F (36.9 C)  TempSrc:  Oral Oral Oral  SpO2: 97% 100% 95% 99%  Weight:      Height:         Intake/Output Summary (Last 24 hours) at 10/13/2021 1527 Last data filed at 10/13/2021 1500 Gross per 24 hour  Intake 920 ml  Output 1600 ml  Net -680 ml    11/30 1901 - 12/02 0700 In: 120 [P.O.:120] Out: 1850 [Urine:1850]  Filed Weights   10/02/21 1041  Weight: 101.6 kg    Data Reviewed: Basic Metabolic Panel: Recent Labs  Lab 10/09/21 0226 10/10/21 0318 10/11/21 0348 10/12/21 0153 10/13/21 0427  NA 133* 134* 134* 137 140  K 3.9 3.7 3.4* 3.7 3.4*  CL 95* 98 100 104 106  CO2 30 27 27 24 25   GLUCOSE 132* 122* 119* 141* 105*  BUN 47* 45* 42* 38* 34*  CREATININE 2.31* 2.08* 2.09* 1.68* 1.69*  CALCIUM 8.0* 8.0* 8.3* 8.3* 8.2*    Liver Function Tests: Recent Labs  Lab 10/09/21 0226 10/11/21 0348  AST 88* 47*  ALT 47* 36  ALKPHOS 179* 181*  BILITOT 0.6 0.5  PROT 6.0* 5.6*  ALBUMIN 2.0* 1.9*   No results for input(s): LIPASE, AMYLASE in the last 168 hours. No results for input(s): AMMONIA in the last 168 hours. CBC: Recent Labs  Lab 10/09/21 0226 10/10/21 0318 10/11/21 0348 10/12/21 0153 10/13/21 0427  WBC 8.3 8.1 8.0 10.0 10.0  NEUTROABS 6.3  --   --   --   --   HGB 6.1* 6.1* 6.1* 6.1* 6.2*  HCT 18.2* 18.4* 18.7* 19.3* 19.0*  MCV 94.3 95.3 96.4 97.0 95.0  PLT 262 271 323 377 346   Cardiac Enzymes: No results for input(s): CKTOTAL, CKMB, CKMBINDEX, TROPONINI in the last 168 hours. BNP (last 3 results) No results for input(s): BNP in the last 8760 hours.  ProBNP (last 3 results) No results for input(s): PROBNP in the last 8760 hours.  CBG: Recent Labs  Lab 10/09/21 0038  GLUCAP 114*       Radiology Reports  No results found.     Antibiotics: Anti-infectives (From admission, onward)    Start     Dose/Rate Route Frequency Ordered Stop   10/10/21 1000  levofloxacin (LEVAQUIN) tablet 250 mg        250 mg Oral Daily 10/09/21 0811     10/09/21 1000  levofloxacin (LEVAQUIN) tablet 500 mg  Status:  Discontinued        500 mg Oral Daily 10/09/21 0758 10/09/21 0811   10/09/21 1000  levofloxacin (LEVAQUIN) tablet 500 mg        500 mg Oral Daily 10/09/21 0811 10/09/21 1116   10/02/21 2145  ceFAZolin (ANCEF) IVPB 2g/100 mL premix        2 g 200 mL/hr over 30 Minutes Intravenous Every 8 hours 10/02/21 2124 10/04/21 1353   10/02/21 1045  ceFAZolin (ANCEF) IVPB 2g/100 mL premix        2 g 200 mL/hr over 30 Minutes Intravenous On call to O.R. 10/02/21 1037 10/02/21 1300         DVT prophylaxis: SCDs    Objective    Physical Examination:   General-appears in no acute distress Heart-S1-S2, regular, no murmur auscultated Lungs-clear to auscultation bilaterally, no wheezing  or crackles auscultated Abdomen-soft, nontender, no organomegaly Extremities-no edema in the lower extremities Neuro-alert, oriented x3, no focal deficit noted   COVID-19 Labs  No results for input(s): DDIMER, FERRITIN, LDH, CRP in the last 72 hours.   Lab Results  Component Value Date   SARSCOV2NAA NEGATIVE 09/28/2021   [place] NEGATIVE 12/22/2019   [place] NEGATIVE 12/17/2019            Recent Results (from the past 240 hour(s))  Urine Culture     Status: None   Collection Time:  10/08/21 10:15 AM   Specimen: Urine, Catheterized  Result Value Ref Range Status   Specimen Description URINE, CATHETERIZED  Final   Special Requests NONE  Final   Culture   Final    NO GROWTH Performed at [facility], [address], [place], [name] [address]    Report Status 10/09/2021 FINAL  Final  Culture, blood (routine x 2)     Status: None (Preliminary result)   Collection Time: 10/09/21  2:26 AM   Specimen: BLOOD  Result Value Ref Range Status   Specimen Description BLOOD LEFT ANTECUBITAL  Final   Special Requests AEROBIC BOTTLE ONLY Blood Culture adequate volume  Final   Culture   Final    NO GROWTH 4 DAYS Performed at [facility] [address], [place], [place] [address]    Report Status PENDING  Incomplete  Culture, blood (routine x 2)     Status: None (Preliminary result)   Collection Time: 10/09/21  2:26 AM   Specimen: BLOOD  Result Value Ref Range Status   Specimen Description BLOOD SITE NOT SPECIFIED  Final   Special Requests AEROBIC BOTTLE ONLY Blood Culture adequate volume  Final   Culture   Final    NO GROWTH 4 DAYS Performed at [facility], [address], [place], [place] [address]    Report Status PENDING  Incomplete  Urine Culture     Status: None   Collection Time: 10/09/21 11:32 AM   Specimen: Urine, Catheterized  Result Value Ref Range Status   Specimen Description URINE, CATHETERIZED  Final   Special Requests NONE   Final   Culture   Final    NO GROWTH Performed at [facility] [address], [place], [place] [address]    Report Status 10/10/2021 FINAL  Final    Oswald Hillock   [facility] If 7PM-7AM, please contact night-coverage at [email or web address], Office  [phone]   10/13/2021, 3:27 PM  LOS: 11 days

## 2021-10-14 LAB — CULTURE, BLOOD (ROUTINE X 2)
Culture: NO GROWTH
Culture: NO GROWTH
Special Requests: ADEQUATE
Special Requests: ADEQUATE

## 2021-10-14 LAB — CBC
HCT: 21.1 % — ABNORMAL LOW (ref 39.0–52.0)
Hemoglobin: 6.7 g/dL — CL (ref 13.0–17.0)
MCH: 31.6 pg (ref 26.0–34.0)
MCHC: 31.8 g/dL (ref 30.0–36.0)
MCV: 99.5 fL (ref 80.0–100.0)
Platelets: 410 10*3/uL — ABNORMAL HIGH (ref 150–400)
RBC: 2.12 MIL/uL — ABNORMAL LOW (ref 4.22–5.81)
RDW: 14.6 % (ref 11.5–15.5)
WBC: 10.4 10*3/uL (ref 4.0–10.5)
nRBC: 0.5 % — ABNORMAL HIGH (ref 0.0–0.2)

## 2021-10-14 LAB — BASIC METABOLIC PANEL
Anion gap: 8 (ref 5–15)
BUN: 32 mg/dL — ABNORMAL HIGH (ref 8–23)
CO2: 25 mmol/L (ref 22–32)
Calcium: 8.1 mg/dL — ABNORMAL LOW (ref 8.9–10.3)
Chloride: 105 mmol/L (ref 98–111)
Creatinine, Ser: 1.48 mg/dL — ABNORMAL HIGH (ref 0.61–1.24)
GFR, Estimated: 49 mL/min — ABNORMAL LOW (ref >60)
Glucose, Bld: 132 mg/dL — ABNORMAL HIGH (ref 70–99)
Potassium: 3.2 mmol/L — ABNORMAL LOW (ref 3.5–5.1)
Sodium: 138 mmol/L (ref 135–145)

## 2021-10-14 MED ORDER — GUAIFENESIN 100 MG/5ML PO LIQD
5.0000 mL | ORAL | Status: DC | PRN
  Administered 2021-10-14 – 2021-10-20 (×9): 5 mL via ORAL
  Filled 2021-10-14 (×9): qty 10

## 2021-10-14 NOTE — Progress Notes (Addendum)
  NEUROSURGERY PROGRESS NOTE   No issues overnight. Pt reports back pain but no leg pain.  EXAM:  BP 124/60 (BP Location: Right Arm)   Pulse 84   Temp 98.8 F (37.1 C) (Oral)   Resp 14   Ht 5' 5.5" (1.664 m)   Wt 101.6 kg   SpO2 100%   BMI 36.71 kg/m   Awake, alert, oriented  Speech fluent, appropriate  CN grossly intact  5/5 BUE/BLE  Wound c/d/I  IMPRESSION:  75 y.o. male POD# 3 s/p lumbar fusion, doing well Postop tachycardia appears resolve Anemia  PLAN: - Cont therapy - If remains stable, plan on d/c to CIR when bed available - Cont to monitor for symptoms of worsening anemia   Consuella Lose, MD [facility]

## 2021-10-14 NOTE — Plan of Care (Signed)

## 2021-10-14 NOTE — Progress Notes (Signed)
PROGRESS NOTE    Bryan [name]  MGQ:[id number] DOB: Aug 20, 1946 DOA: 10/02/2021 PCP: [name], FNP    Brief Narrative:  75 year old male-year-old male with history of marginal zone B-cell lymphoma, BPH, OSA, hypertension, CAD, diabetes mellitus type 2, obesity, Jehovah's Witness underwent lumbar fusion with removal of hardware of L4 S1 on 11/21.  Hospital course was complicated by A. fib with RVR, confusion and fever with anemia and hemoglobin of 6.1 in last 2 days.  TRH was consulted on 11/28 for evaluation of fever and confusion    Assessment & Plan:  S/p lumbar fusion on 10/02/2021 Doing well neurosurgery following Seen by neurology todayCont therapy - If remains stable, plan on d/c to CIR when bed available - Cont to monitor for symptoms of worsening anemia  BPH Has Foley catheter in place Continue Flomax   Postop fever resolved No source On Levaquin empirically for 7 days CT of the chest negative, blood culture negative, urine culture negative Afebrile now  Postoperative A. fib resolved Now in sinus Cardiology following On Toprol IV amiodarone DC Anticoagulation on hold because of severe anemia  Severe anemia Patient refusing transfusion  Acute kidney injury on chronic kidney disease stage III Hydrochlorothiazide on hold Creatinine improving Follow BUN and creatinine avoid nephrotoxins  History of rheumatoid arthritis Continue Plaquenil  History of B-cell lymphoma Reportedly in remission per last oncology note  History of coronary artery disease Resume beta-blocker and statin     Principal Problem:   Spinal stenosis of lumbar region Active Problems:   BPH (benign prostatic hyperplasia)   Essential hypertension   Rheumatoid arthritis (HCC)   Refusal of blood transfusions as patient is Jehovah's Witness   Marginal zone lymphoma ([place])   Severe sepsis ([place])   New onset atrial fibrillation (HCC)   Acute blood loss anemia      DVT  prophylaxis: SCD Code Status: Full code Family Communication: ( Disposition Plan: 1 or 2 days   Consultants:    Procedures: Lumbar fusion  Antimicrobials: Levaquin empirically  Subjective: No complaints no arrhythmias  Objective: Vitals:   10/13/21 2335 10/14/21 0337 10/14/21 0730 10/14/21 1254  BP: (!) 141/54 136/62 124/60 (!) 141/53  Pulse: 84 85 84 81  Resp: 19 18 14 20   Temp: 99.1 F (37.3 C) 98.7 F (37.1 C) 98.8 F (37.1 C) 98.7 F (37.1 C)  TempSrc: Oral Oral Oral Oral  SpO2: 100% 100% 100% 100%  Weight:      Height:        Intake/Output Summary (Last 24 hours) at 10/14/2021 1454 Last data filed at 10/14/2021 0339 Gross per 24 hour  Intake 500 ml  Output 1500 ml  Net -1000 ml   Filed Weights   10/02/21 1041  Weight: 101.6 kg    Examination:  General exam: Appears calm and comfortable  Respiratory system: Clear to auscultation. Respiratory effort normal. Cardiovascular system: S1 & S2 heard, RRR. No JVD, murmurs, rubs, gallops or clicks. No pedal edema. Gastrointestinal system: Abdomen is nondistended, soft and nontender. No organomegaly or masses felt. Normal bowel sounds heard. Central nervous system: Alert and oriented. No focal neurological deficits. Extremities: Symmetric 5 x 5 power. Skin: No rashes, lesions or ulcers Psychiatry: Judgement and insight appear normal. Mood & affect appropriate.     Data Reviewed: I have personally reviewed following labs and imaging studies  CBC: Recent Labs  Lab 10/09/21 0226 10/10/21 0318 10/11/21 0348 10/12/21 0153 10/13/21 0427  WBC 8.3 8.1 8.0 10.0 10.0  NEUTROABS 6.3  --   --   --   --   HGB 6.1* 6.1* 6.1* 6.1* 6.2*  HCT 18.2* 18.4* 18.7* 19.3* 19.0*  MCV 94.3 95.3 96.4 97.0 95.0  PLT 262 271 323 377 315   Basic Metabolic Panel: Recent Labs  Lab 10/09/21 0226 10/10/21 0318 10/11/21 0348 10/12/21 0153 10/13/21 0427  NA 133* 134* 134* 137 140  K 3.9 3.7 3.4* 3.7 3.4*  CL 95* 98 100 104  106  CO2 30 27 27 24 25   GLUCOSE 132* 122* 119* 141* 105*  BUN 47* 45* 42* 38* 34*  CREATININE 2.31* 2.08* 2.09* 1.68* 1.69*  CALCIUM 8.0* 8.0* 8.3* 8.3* 8.2*   GFR: Estimated Creatinine Clearance: 41.2 mL/min (A) (by C-G formula based on SCr of 1.69 mg/dL (H)). Liver Function Tests: Recent Labs  Lab 10/09/21 0226 10/11/21 0348  AST 88* 47*  ALT 47* 36  ALKPHOS 179* 181*  BILITOT 0.6 0.5  PROT 6.0* 5.6*  ALBUMIN 2.0* 1.9*   No results for input(s): LIPASE, AMYLASE in the last 168 hours. No results for input(s): AMMONIA in the last 168 hours. Coagulation Profile: No results for input(s): INR, PROTIME in the last 168 hours. Cardiac Enzymes: No results for input(s): CKTOTAL, CKMB, CKMBINDEX, TROPONINI in the last 168 hours. BNP (last 3 results) No results for input(s): PROBNP in the last 8760 hours. HbA1C: No results for input(s): HGBA1C in the last 72 hours. CBG: Recent Labs  Lab 10/09/21 0038  GLUCAP 114*   Lipid Profile: No results for input(s): CHOL, HDL, LDLCALC, TRIG, CHOLHDL, LDLDIRECT in the last 72 hours. Thyroid Function Tests: No results for input(s): TSH, T4TOTAL, FREET4, T3FREE, THYROIDAB in the last 72 hours. Anemia Panel: No results for input(s): VITAMINB12, FOLATE, FERRITIN, TIBC, IRON, RETICCTPCT in the last 72 hours. Sepsis Labs: Recent Labs  Lab 10/09/21 0227 10/09/21 0601  LATICACIDVEN 1.0 0.9    Recent Results (from the past 240 hour(s))  Urine Culture     Status: None   Collection Time: 10/08/21 10:15 AM   Specimen: Urine, Catheterized  Result Value Ref Range Status   Specimen Description URINE, CATHETERIZED  Final   Special Requests NONE  Final   Culture   Final    NO GROWTH Performed at [facility], [address] [place], [place] [address]    Report Status 10/09/2021 FINAL  Final  Culture, blood (routine x 2)     Status: None   Collection Time: 10/09/21  2:26 AM   Specimen: BLOOD  Result Value Ref Range Status   Specimen  Description BLOOD LEFT ANTECUBITAL  Final   Special Requests AEROBIC BOTTLE ONLY Blood Culture adequate volume  Final   Culture   Final    NO GROWTH 5 DAYS Performed at [facility] [address], [place], [place] [address]    Report Status 10/14/2021 FINAL  Final  Culture, blood (routine x 2)     Status: None   Collection Time: 10/09/21  2:26 AM   Specimen: BLOOD  Result Value Ref Range Status   Specimen Description BLOOD SITE NOT SPECIFIED  Final   Special Requests AEROBIC BOTTLE ONLY Blood Culture adequate volume  Final   Culture   Final    NO GROWTH 5 DAYS Performed at [facility], [address] [place], [place] [address]    Report Status 10/14/2021 FINAL  Final  Urine Culture     Status: None   Collection Time: 10/09/21  11:32 AM   Specimen: Urine, Catheterized  Result Value Ref Range Status   Specimen Description URINE, CATHETERIZED  Final   Special Requests NONE  Final   Culture   Final    NO GROWTH Performed at [facility], [address] [place], [place] [address]    Report Status 10/10/2021 FINAL  Final         Radiology Studies: No results found.      Scheduled Meds:  atorvastatin  20 mg Oral Daily   Chlorhexidine Gluconate Cloth  6 each Topical Daily   gabapentin  300 mg Oral BID   iron polysaccharides  150 mg Oral Daily   levofloxacin  250 mg Oral Daily   metoprolol succinate  100 mg Oral Daily   pantoprazole  40 mg Oral QHS   primidone  150 mg Oral QHS   sodium chloride flush  3 mL Intravenous Q12H   tamsulosin  0.4 mg Oral QHS   Continuous Infusions:  sodium chloride 250 mL (10/03/21 0533)   amiodarone Stopped (10/09/21 1546)   sodium chloride       LOS: 12 days    Time spent: More than 45 minutes    Assunta Found, MD [facility]    10/14/2021, 2:54 PM

## 2021-10-15 MED ORDER — AMLODIPINE BESYLATE 2.5 MG PO TABS: 2.5000 mg | ORAL_TABLET | Freq: Every day | ORAL | Status: DC

## 2021-10-15 MED ORDER — POTASSIUM CHLORIDE CRYS ER 20 MEQ PO TBCR
40.0000 meq | EXTENDED_RELEASE_TABLET | Freq: Once | ORAL | Status: AC
  Administered 2021-10-15: 10:00:00 40 meq via ORAL
  Filled 2021-10-15: qty 2

## 2021-10-15 MED ORDER — GABAPENTIN 300 MG PO CAPS
300.0000 mg | ORAL_CAPSULE | Freq: Two times a day (BID) | ORAL | Status: DC
  Administered 2021-10-15 – 2021-10-20 (×11): 300 mg via ORAL
  Filled 2021-10-15 (×11): qty 1

## 2021-10-15 MED ORDER — AMLODIPINE BESYLATE 5 MG PO TABS: 5.0000 mg | ORAL_TABLET | Freq: Every day | ORAL | Status: DC

## 2021-10-15 NOTE — Progress Notes (Signed)
  NEUROSURGERY PROGRESS NOTE   No issues overnight. Pt reports feeling better this am.  EXAM:  BP (!) 159/61 (BP Location: Right Arm)   Pulse 86   Temp 98.3 F (36.8 C) (Oral)   Resp 16   Ht 5' 5.5" (1.664 m)   Wt 101.6 kg   SpO2 100%   BMI 36.71 kg/m   Awake, alert, oriented  Speech fluent, appropriate  CN grossly intact  5/5 BUE/BLE  Wound c/d/I  IMPRESSION:  75 y.o. male POD# 4 s/p lumbar fusion, doing well Postop tachycardia appears resolved Anemia  PLAN: - Cont therapy - Remains neurologically and hemodynamically stable for rehab  Consuella Lose, MD [facility]

## 2021-10-15 NOTE — Progress Notes (Signed)
PROGRESS NOTE    Bryan [name]  YIF:[id number] DOB: 08-06-1946 DOA: 10/02/2021 PCP: [name], FNP    Brief Narrative:   75 year old male-year-old male with history of marginal zone B-cell lymphoma, BPH, OSA, hypertension, CAD, diabetes mellitus type 2, obesity, Jehovah's Witness underwent lumbar fusion with removal of hardware of L4 S1 on 11/21.  Hospital course was complicated by A. fib with RVR, confusion and fever with anemia and hemoglobin of 6.1 in last 2 days.  TRH was consulted on 11/28 for evaluation of fever and confusion  Assessment & Plan:  S/p lumbar fusion on 10/02/2021 Doing well neurosurgery following Seen by neurology todayCont therapy - If remains stable, plan on d/c to CIR when bed available/inpatient rehab - Cont to monitor for symptoms of worsening anemia   BPH Has Foley catheter in place Continue Flomax May need to consult urology and tried to remove the Foley Before he goes to inpatient rehab  Postop fever resolved No source On Levaquin empirically for 7 days CT of the chest negative, blood culture negative, urine culture negative Afebrile now   Postoperative A. fib resolved Now in sinus Cardiology following On Toprol IV amiodarone DC Anticoagulation on hold because of severe anemia   Severe anemia Patient refusing transfusion Hemoglobin 6.7 today   Acute kidney injury on chronic kidney disease stage III Hydrochlorothiazide on hold Creatinine improving Follow BUN and creatinine avoid nephrotoxins Creatinine improving Potassium 3.2- 40kcl mg p.o.   History of rheumatoid arthritis Continue Plaquenil   History of B-cell lymphoma Reportedly in remission per last oncology note   History of coronary artery disease Resume beta-blocker and statin  Essential hypertension Patient was mild elevated blood pressure during admission Will add Norvasc 2.5 mg p.o. stool to his beta-blocker   Elevated liver enzymes Repeat CMP in the  morning  Principal Problem:   Spinal stenosis of lumbar region Active Problems:   BPH (benign prostatic hyperplasia)   Essential hypertension   Rheumatoid arthritis (HCC)   Refusal of blood transfusions as patient is Jehovah's Witness   Marginal zone lymphoma ([place])   Severe sepsis ([place])   New onset atrial fibrillation ([place])   Acute blood loss anemia      DVT prophylaxis: (Lovenox/ Code Status: Full code Family Communication: No family in the room Disposition Plan: Await for inpatient rehab   Consultants:  Neurosurgery   Subjective: No complaints  Objective: Vitals:   10/14/21 1942 10/14/21 2300 10/15/21 0310 10/15/21 0828  BP: (!) 126/58 (!) 128/57 (!) 154/64 (!) 159/61  Pulse: 81 87 90 86  Resp: 20 17 17 16   Temp: 98.9 F (37.2 C) 98.9 F (37.2 C) 98.8 F (37.1 C) 98.3 F (36.8 C)  TempSrc: Oral Oral Oral Oral  SpO2: 100% 100% 98% 100%  Weight:      Height:        Intake/Output Summary (Last 24 hours) at 10/15/2021 1343 Last data filed at 10/15/2021 0406 Gross per 24 hour  Intake 800 ml  Output 1525 ml  Net -725 ml   Filed Weights   10/02/21 1041  Weight: 101.6 kg    Examination:  General exam: Appears calm and comfortable  Respiratory system: Clear to auscultation. Respiratory effort normal. Cardiovascular system: S1 & S2 heard, RRR. No JVD, murmurs, rubs, gallops or clicks. No pedal edema. Gastrointestinal system: Abdomen is nondistended, soft and nontender. No organomegaly or masses felt. Normal bowel sounds heard. Central nervous system: Alert and oriented. No focal neurological deficits. Extremities: Symmetric 5  x 5 power. Skin: No rashes, lesions or ulcers Psychiatry: Judgement and insight appear normal. Mood & affect appropriate.     Data Reviewed: I have personally reviewed following labs and imaging studies  CBC: Recent Labs  Lab 10/09/21 0226 10/10/21 0318 10/11/21 0348 10/12/21 0153 10/13/21 0427 10/14/21 1631  WBC 8.3 8.1  8.0 10.0 10.0 10.4  NEUTROABS 6.3  --   --   --   --   --   HGB 6.1* 6.1* 6.1* 6.1* 6.2* 6.7*  HCT 18.2* 18.4* 18.7* 19.3* 19.0* 21.1*  MCV 94.3 95.3 96.4 97.0 95.0 99.5  PLT 262 271 323 377 346 161*   Basic Metabolic Panel: Recent Labs  Lab 10/10/21 0318 10/11/21 0348 10/12/21 0153 10/13/21 0427 10/14/21 1631  NA 134* 134* 137 140 138  K 3.7 3.4* 3.7 3.4* 3.2*  CL 98 100 104 106 105  CO2 27 27 24 25 25   GLUCOSE 122* 119* 141* 105* 132*  BUN 45* 42* 38* 34* 32*  CREATININE 2.08* 2.09* 1.68* 1.69* 1.48*  CALCIUM 8.0* 8.3* 8.3* 8.2* 8.1*   GFR: Estimated Creatinine Clearance: 47 mL/min (A) (by C-G formula based on SCr of 1.48 mg/dL (H)). Liver Function Tests: Recent Labs  Lab 10/09/21 0226 10/11/21 0348  AST 88* 47*  ALT 47* 36  ALKPHOS 179* 181*  BILITOT 0.6 0.5  PROT 6.0* 5.6*  ALBUMIN 2.0* 1.9*   No results for input(s): LIPASE, AMYLASE in the last 168 hours. No results for input(s): AMMONIA in the last 168 hours. Coagulation Profile: No results for input(s): INR, PROTIME in the last 168 hours. Cardiac Enzymes: No results for input(s): CKTOTAL, CKMB, CKMBINDEX, TROPONINI in the last 168 hours. BNP (last 3 results) No results for input(s): PROBNP in the last 8760 hours. HbA1C: No results for input(s): HGBA1C in the last 72 hours. CBG: Recent Labs  Lab 10/09/21 0038  GLUCAP 114*   Lipid Profile: No results for input(s): CHOL, HDL, LDLCALC, TRIG, CHOLHDL, LDLDIRECT in the last 72 hours. Thyroid Function Tests: No results for input(s): TSH, T4TOTAL, FREET4, T3FREE, THYROIDAB in the last 72 hours. Anemia Panel: No results for input(s): VITAMINB12, FOLATE, FERRITIN, TIBC, IRON, RETICCTPCT in the last 72 hours. Sepsis Labs: Recent Labs  Lab 10/09/21 0227 10/09/21 0601  LATICACIDVEN 1.0 0.9    Recent Results (from the past 240 hour(s))  Urine Culture     Status: None   Collection Time: 10/08/21 10:15 AM   Specimen: Urine, Catheterized  Result Value Ref  Range Status   Specimen Description URINE, CATHETERIZED  Final   Special Requests NONE  Final   Culture   Final    NO GROWTH Performed at [facility], [address], [place], [place] [address]    Report Status 10/09/2021 FINAL  Final  Culture, blood (routine x 2)     Status: None   Collection Time: 10/09/21  2:26 AM   Specimen: BLOOD  Result Value Ref Range Status   Specimen Description BLOOD LEFT ANTECUBITAL  Final   Special Requests AEROBIC BOTTLE ONLY Blood Culture adequate volume  Final   Culture   Final    NO GROWTH 5 DAYS Performed at [facility] [address], [place], [place] [address]    Report Status 10/14/2021 FINAL  Final  Culture, blood (routine x 2)     Status: None   Collection Time: 10/09/21  2:26 AM   Specimen: BLOOD  Result Value Ref Range Status   Specimen Description  BLOOD SITE NOT SPECIFIED  Final   Special Requests AEROBIC BOTTLE ONLY Blood Culture adequate volume  Final   Culture   Final    NO GROWTH 5 DAYS Performed at [facility], [place] [address] [place], [place] [address]    Report Status 10/14/2021 FINAL  Final  Urine Culture     Status: None   Collection Time: 10/09/21 11:32 AM   Specimen: Urine, Catheterized  Result Value Ref Range Status   Specimen Description URINE, CATHETERIZED  Final   Special Requests NONE  Final   Culture   Final    NO GROWTH Performed at [facility] [address], [place], [place] [address]    Report Status 10/10/2021 FINAL  Final         Radiology Studies: No results found.      Scheduled Meds:  amLODipine  5 mg Oral Daily   atorvastatin  20 mg Oral Daily   Chlorhexidine Gluconate Cloth  6 each Topical Daily   gabapentin  300 mg Oral BID   iron polysaccharides  150 mg Oral Daily   levofloxacin  250 mg Oral Daily   metoprolol succinate  100 mg Oral Daily   pantoprazole  40 mg Oral QHS   primidone  150 mg Oral QHS   sodium chloride flush  3 mL Intravenous Q12H    tamsulosin  0.4 mg Oral QHS   Continuous Infusions:  sodium chloride 250 mL (10/03/21 0533)   amiodarone Stopped (10/09/21 1546)   sodium chloride       LOS: 13 days    Time spent: More than 35 minutes    Judithe Keetch G Ho Parisi, MD [facility]   If 7PM-7AM, please contact night-coverage [email or web address] Password [email or web address] 10/15/2021, 1:43 PM

## 2021-10-15 NOTE — Plan of Care (Signed)

## 2021-10-16 ENCOUNTER — Telehealth: Payer: Self-pay

## 2021-10-16 LAB — COMPREHENSIVE METABOLIC PANEL
ALT: 23 U/L (ref 0–44)
AST: 29 U/L (ref 15–41)
Albumin: 2 g/dL — ABNORMAL LOW (ref 3.5–5.0)
Alkaline Phosphatase: 120 U/L (ref 38–126)
Anion gap: 9 (ref 5–15)
BUN: 23 mg/dL (ref 8–23)
CO2: 24 mmol/L (ref 22–32)
Calcium: 7.8 mg/dL — ABNORMAL LOW (ref 8.9–10.3)
Chloride: 105 mmol/L (ref 98–111)
Creatinine, Ser: 1.4 mg/dL — ABNORMAL HIGH (ref 0.61–1.24)
GFR, Estimated: 52 mL/min — ABNORMAL LOW (ref >60)
Glucose, Bld: 102 mg/dL — ABNORMAL HIGH (ref 70–99)
Potassium: 2.9 mmol/L — ABNORMAL LOW (ref 3.5–5.1)
Sodium: 138 mmol/L (ref 135–145)
Total Bilirubin: 0.3 mg/dL (ref 0.3–1.2)
Total Protein: 5.2 g/dL — ABNORMAL LOW (ref 6.5–8.1)

## 2021-10-16 LAB — CBC
HCT: 19.9 % — ABNORMAL LOW (ref 39.0–52.0)
Hemoglobin: 6.3 g/dL — CL (ref 13.0–17.0)
MCH: 31.3 pg (ref 26.0–34.0)
MCHC: 31.7 g/dL (ref 30.0–36.0)
MCV: 99 fL (ref 80.0–100.0)
Platelets: 360 10*3/uL (ref 150–400)
RBC: 2.01 MIL/uL — ABNORMAL LOW (ref 4.22–5.81)
RDW: 15.2 % (ref 11.5–15.5)
WBC: 8.6 10*3/uL (ref 4.0–10.5)
nRBC: 0.2 % (ref 0.0–0.2)

## 2021-10-16 LAB — MAGNESIUM: Magnesium: 1.3 mg/dL — ABNORMAL LOW (ref 1.7–2.4)

## 2021-10-16 MED ORDER — POTASSIUM CHLORIDE CRYS ER 20 MEQ PO TBCR
40.0000 meq | EXTENDED_RELEASE_TABLET | Freq: Once | ORAL | Status: AC
  Administered 2021-10-16: 40 meq via ORAL
  Filled 2021-10-16: qty 2

## 2021-10-16 MED ORDER — MAGNESIUM SULFATE 4 GM/100ML IV SOLN
4.0000 g | Freq: Once | INTRAVENOUS | Status: AC
  Administered 2021-10-16: 4 g via INTRAVENOUS
  Filled 2021-10-16: qty 100

## 2021-10-16 NOTE — Progress Notes (Signed)
Inpatient Rehab Admissions Coordinator:   Pt. Appears stable, now in sinus rhythm. I sent updates to the [facility] for CIR prior authorization and await a response.   Clemens Catholic, [place], [place] Admissions Coordinator  [phone] ([place]) [phone] (office)

## 2021-10-16 NOTE — Progress Notes (Signed)
PROGRESS NOTE    Bryan [name]  IEP:[id number] DOB: 12-09-1945 DOA: 10/02/2021 PCP: [name], FNP   Brief Narrative:  75 year old male-year-old male with history of marginal zone B-cell lymphoma, BPH, OSA, hypertension, CAD, diabetes mellitus type 2, obesity, Jehovah's Witness underwent lumbar fusion with removal of hardware of L4 S1 on 11/21.  Hospital course was complicated by A. fib with RVR, confusion and fever with anemia and hemoglobin of 6.1 in last 2 days.  TRH was consulted on 11/28 for evaluation of fever and confusion.  Assessment & Plan:   Principal Problem:   Spinal stenosis of lumbar region Active Problems:   BPH (benign prostatic hyperplasia)   Essential hypertension   Rheumatoid arthritis ([place])   Refusal of blood transfusions as patient is Jehovah's Witness   Marginal zone lymphoma ([place])   Severe sepsis ([place])   New onset atrial fibrillation (HCC)   Acute blood loss anemia  Back pain: S/p lumbar fusion on 10/02/2021, management per primary/neurosurgery team.  BPH with urinary retention: Has Foley catheter and he is on Flomax.  Would recommend removing Foley catheter if there is no other indication to keep it going, defer to primary.  Tried to reach out to the primary via secure chat but Dr. Saintclair Halsted marked as " unavailable"  Postop fever: The main reason hospitalist were consulted, source remains unclear, all cultures negative.  Will complete 7 days of Levaquin today.  He is completely asymptomatic.  Postop transient atrial fibrillation: Resolved and now he is in normal sinus rhythm.  On Toprol-XL.  Not on anticoagulation for multiple reasons, brief episode, now in sinus rhythm and anemia, Jehovah's Witness.  Cardiology signed off many days ago and they will follow-up with them as outpatient.  Anemia of chronic disease: Hemoglobin between 6 and 7, he is refusing transfusion, he is Jehovah's Witness.  History of RA: Continue Plaquenil.  History of B-cell  lymphoma: Follow-up with oncology as outpatient.  History of CAD: Continue statin and beta-blocker.  Essential hypertension: Fairly controlled, continue Toprol-XL and as needed hydralazine.  Hypokalemia: 2.9.  Will replace.  AKI on CKD stage IIIa: Back at baseline.  DVT prophylaxis: SCD's Start: 10/02/21 2125   Code Status: Full Code  Family Communication:  None present at bedside.  Plan of care discussed with patient in length and he verbalized understanding and agreed with it.  Status is: Inpatient  Remains inpatient appropriate because: Pending CIR admission.  Estimated body mass index is 36.71 kg/m as calculated from the following:   Height as of this encounter: 5' 5.5" (1.664 m).   Weight as of this encounter: 101.6 kg.     Nutritional Assessment: Body mass index is 36.71 kg/m.[name] Seen by dietician.  I agree with the assessment and plan as outlined below: Nutrition Status:        .  Skin Assessment: I have examined the patient's skin and I agree with the wound assessment as performed by the wound care RN as outlined below:    Consultants:  TRH  Procedures:  As above  Antimicrobials:  Anti-infectives (From admission, onward)    Start     Dose/Rate Route Frequency Ordered Stop   10/10/21 1000  levofloxacin (LEVAQUIN) tablet 250 mg  Status:  Discontinued        250 mg Oral Daily 10/09/21 0811 10/16/21 1041   10/09/21 1000  levofloxacin (LEVAQUIN) tablet 500 mg  Status:  Discontinued        500 mg Oral Daily 10/09/21 0758  10/09/21 0811   10/09/21 1000  levofloxacin (LEVAQUIN) tablet 500 mg        500 mg Oral Daily 10/09/21 0811 10/09/21 1116   10/02/21 2145  ceFAZolin (ANCEF) IVPB 2g/100 mL premix        2 g 200 mL/hr over 30 Minutes Intravenous Every 8 hours 10/02/21 2124 10/04/21 1353   10/02/21 1045  ceFAZolin (ANCEF) IVPB 2g/100 mL premix        2 g 200 mL/hr over 30 Minutes Intravenous On call to O.R. 10/02/21 1037 10/02/21 1300           Subjective: Seen and examined.  He has no complaints.  Objective: Vitals:   10/15/21 1953 10/15/21 2312 10/16/21 0320 10/16/21 0700  BP: (!) 134/54 (!) 146/66 (!) 155/56 (!) 148/68  Pulse: 80 80 78   Resp: (!) 21 20 20    Temp: 98.7 F (37.1 C) 97.8 F (36.6 C)  97.7 F (36.5 C)  TempSrc: Oral Oral  Oral  SpO2: 99% 99% 98%   Weight:      Height:        Intake/Output Summary (Last 24 hours) at 10/16/2021 1141 Last data filed at 10/16/2021 0900 Gross per 24 hour  Intake 200 ml  Output 1551 ml  Net -1351 ml   Filed Weights   10/02/21 1041  Weight: 101.6 kg    Examination:  General exam: Appears calm and comfortable  Respiratory system: Clear to auscultation. Respiratory effort normal. Cardiovascular system: S1 & S2 heard, RRR. No JVD, murmurs, rubs, gallops or clicks.  +1 pitting edema bilateral lower extremity. Gastrointestinal system: Abdomen is nondistended, soft and nontender. No organomegaly or masses felt. Normal bowel sounds heard. Central nervous system: Alert and oriented. No focal neurological deficits. Extremities: Symmetric 5 x 5 power. Skin: No rashes, lesions or ulcers Psychiatry: Judgement and insight appear normal. Mood & affect appropriate.    Data Reviewed: I have personally reviewed following labs and imaging studies  CBC: Recent Labs  Lab 10/11/21 0348 10/12/21 0153 10/13/21 0427 10/14/21 1631 10/16/21 0403  WBC 8.0 10.0 10.0 10.4 8.6  HGB 6.1* 6.1* 6.2* 6.7* 6.3*  HCT 18.7* 19.3* 19.0* 21.1* 19.9*  MCV 96.4 97.0 95.0 99.5 99.0  PLT 323 377 346 410* 073   Basic Metabolic Panel: Recent Labs  Lab 10/11/21 0348 10/12/21 0153 10/13/21 0427 10/14/21 1631 10/16/21 0403  NA 134* 137 140 138 138  K 3.4* 3.7 3.4* 3.2* 2.9*  CL 100 104 106 105 105  CO2 27 24 25 25 24   GLUCOSE 119* 141* 105* 132* 102*  BUN 42* 38* 34* 32* 23  CREATININE 2.09* 1.68* 1.69* 1.48* 1.40*  CALCIUM 8.3* 8.3* 8.2* 8.1* 7.8*   GFR: Estimated Creatinine  Clearance: 49.7 mL/min (A) (by C-G formula based on SCr of 1.4 mg/dL (H)). Liver Function Tests: Recent Labs  Lab 10/11/21 0348 10/16/21 0403  AST 47* 29  ALT 36 23  ALKPHOS 181* 120  BILITOT 0.5 0.3  PROT 5.6* 5.2*  ALBUMIN 1.9* 2.0*   No results for input(s): LIPASE, AMYLASE in the last 168 hours. No results for input(s): AMMONIA in the last 168 hours. Coagulation Profile: No results for input(s): INR, PROTIME in the last 168 hours. Cardiac Enzymes: No results for input(s): CKTOTAL, CKMB, CKMBINDEX, TROPONINI in the last 168 hours. BNP (last 3 results) No results for input(s): PROBNP in the last 8760 hours. HbA1C: No results for input(s): HGBA1C in the last 72 hours. CBG: No results for input(s): GLUCAP  in the last 168 hours. Lipid Profile: No results for input(s): CHOL, HDL, LDLCALC, TRIG, CHOLHDL, LDLDIRECT in the last 72 hours. Thyroid Function Tests: No results for input(s): TSH, T4TOTAL, FREET4, T3FREE, THYROIDAB in the last 72 hours. Anemia Panel: No results for input(s): VITAMINB12, FOLATE, FERRITIN, TIBC, IRON, RETICCTPCT in the last 72 hours. Sepsis Labs: No results for input(s): PROCALCITON, LATICACIDVEN in the last 168 hours.  Recent Results (from the past 240 hour(s))  Urine Culture     Status: None   Collection Time: 10/08/21 10:15 AM   Specimen: Urine, Catheterized  Result Value Ref Range Status   Specimen Description URINE, CATHETERIZED  Final   Special Requests NONE  Final   Culture   Final    NO GROWTH Performed at [facility], [address], [place], [place] [address]    Report Status 10/09/2021 FINAL  Final  Culture, blood (routine x 2)     Status: None   Collection Time: 10/09/21  2:26 AM   Specimen: BLOOD  Result Value Ref Range Status   Specimen Description BLOOD LEFT ANTECUBITAL  Final   Special Requests AEROBIC BOTTLE ONLY Blood Culture adequate volume  Final   Culture   Final    NO GROWTH 5 DAYS Performed at [facility] [address] [place], [place] [address]    Report Status 10/14/2021 FINAL  Final  Culture, blood (routine x 2)     Status: None   Collection Time: 10/09/21  2:26 AM   Specimen: BLOOD  Result Value Ref Range Status   Specimen Description BLOOD SITE NOT SPECIFIED  Final   Special Requests AEROBIC BOTTLE ONLY Blood Culture adequate volume  Final   Culture   Final    NO GROWTH 5 DAYS Performed at [facility], [address], [place], [place] [address]    Report Status 10/14/2021 FINAL  Final  Urine Culture     Status: None   Collection Time: 10/09/21 11:32 AM   Specimen: Urine, Catheterized  Result Value Ref Range Status   Specimen Description URINE, CATHETERIZED  Final   Special Requests NONE  Final   Culture   Final    NO GROWTH Performed at [facility] [address], [place], [place] [address]    Report Status 10/10/2021 FINAL  Final      Radiology Studies: No results found.  Scheduled Meds:  atorvastatin  20 mg Oral Daily   Chlorhexidine Gluconate Cloth  6 each Topical Daily   gabapentin  300 mg Oral BID   iron polysaccharides  150 mg Oral Daily   metoprolol succinate  100 mg Oral Daily   pantoprazole  40 mg Oral QHS   potassium chloride  40 mEq Oral Once   primidone  150 mg Oral QHS   sodium chloride flush  3 mL Intravenous Q12H   tamsulosin  0.4 mg Oral QHS   Continuous Infusions:  sodium chloride 250 mL (10/03/21 0533)   sodium chloride       LOS: 14 days   Time spent: 30 minutes   Darliss Cheney, MD [facility]  10/16/2021, 11:41 AM  Please page via [facility] and do not message via secure chat for anything urgent. Secure chat can be used for anything non urgent.  How to contact the [facility] Attending or Consulting provider [place] or covering provider during after hours [place], for this patient?  Check the care team in [facility] and look  for a) attending/consulting [place] provider listed and b) the [facility] team listed. Page or secure chat [phone]. Log  into [email or web address] and use [place]'s universal password to access. If you do not have the password, please contact the hospital operator. Locate the [facility] provider you are looking for under [facility] and page to a number that you can be directly reached. If you still have difficulty reaching the provider, please page the [facility] (Director on Call) for the Hospitalists listed on amion for assistance.

## 2021-10-16 NOTE — Progress Notes (Signed)
Subjective: Patient reports  feeling well today no back and leg pain  Objective: Vital signs in last 24 hours: Temp:  [97.7 F (36.5 C)-98.7 F (37.1 C)] 97.7 F (36.5 C) (12/05 0700) Pulse Rate:  [78-85] 78 (12/05 0320) Resp:  [18-21] 20 (12/05 0320) BP: (121-155)/(54-68) 148/68 (12/05 0700) SpO2:  [98 %-99 %] 98 % (12/05 0320)  Intake/Output from previous day: 12/04 0701 - 12/05 0700 In: 400 [P.O.:400] Out: 1250 [Urine:1250] Intake/Output this shift: No intake/output data recorded.  Awake and alert strength 5 out of 5  Lab Results: Recent Labs    10/14/21 1631 10/16/21 0403  WBC 10.4 8.6  HGB 6.7* 6.3*  HCT 21.1* 19.9*  PLT 410* 360   BMET Recent Labs    10/14/21 1631 10/16/21 0403  NA 138 138  K 3.2* 2.9*  CL 105 105  CO2 25 24  GLUCOSE 132* 102*  BUN 32* 23  CREATININE 1.48* 1.40*  CALCIUM 8.1* 7.8*    Studies/Results: No results found.  Assessment/Plan: Doing well postop day 14 heart rate seems to be well controlled blood pressure stable patient is stable for transfer to rehab when bed becomes available potassium continues to run low but low we will supplement again with 40 mg of p.o. potassium  LOS: 14 days     Bryan [name] 10/16/2021, 9:49 AM

## 2021-10-16 NOTE — Progress Notes (Signed)
0500-Critical Lab Value   Hgb 6.3   MD X.Blount paged, patient has been having a critical hgb and has refused blood products due to religious reasoning.   Awaiting any new orders.   0639-No new orders at this time

## 2021-10-16 NOTE — Progress Notes (Signed)
Physical Therapy Treatment Patient Details Name: Bryan [name] MRN: [id number] DOB: Aug 25, 1946 Today's Date: 10/16/2021   History of Present Illness 75 yo male s/p L2-4 decompression and fusion, exploration of L4-S1 fusion, redo L5-S1 fusion on 11/22. Developed tachycardia up to 150s on 11/27, cardiology following. PMH includes OA, BPH, cancer, DM, obesity, RA, seizures, ACDF 2014, lap chole, lumbar surgeries 2016-2017.    PT Comments    Pt required min assist bed mobility, min assist transfers, and min guard assist ambulation 50' with RW. 2/4 DOE during amb, HR in 110s, SpO2 in 90s on RA. Pt independently recalled 3/3 back precautions. Pt assisted to/from bathroom prior to sitting in recliner. Pt in recliner with feet elevated at end of session.    Recommendations for follow up therapy are one component of a multi-disciplinary discharge planning process, led by the attending physician.  Recommendations may be updated based on patient status, additional functional criteria and insurance authorization.  Follow Up Recommendations  Acute inpatient rehab (3hours/day)     Assistance Recommended at Discharge Frequent or constant Supervision/Assistance  Equipment Recommendations  Rolling walker (2 wheels);BSC/3in1    Recommendations for Other Services       Precautions / Restrictions Precautions Precautions: Fall;Back Precaution Comments: Pt independently recalled 3/3 back precautions. Required Braces or Orthoses: Spinal Brace Spinal Brace: Lumbar corset;Applied in sitting position     Mobility  Bed Mobility Overal bed mobility: Needs Assistance Bed Mobility: Rolling;Sidelying to Sit Rolling: Min assist Sidelying to sit: Min assist       General bed mobility comments: +rail, increased time, assist with trunk    Transfers Overall transfer level: Needs assistance Equipment used: Rolling walker (2 wheels) Transfers: Sit to/from Stand Sit to Stand: Min assist            General transfer comment: assist to power up and stabilize balance    Ambulation/Gait Ambulation/Gait assistance: Min guard Gait Distance (Feet): 50 Feet Assistive device: Rolling walker (2 wheels) Gait Pattern/deviations: Step-through pattern;Decreased stride length;Trunk flexed Gait velocity: decreased Gait velocity interpretation: 1.31 - 2.62 ft/sec, indicative of limited community ambulator   General Gait Details: cues for posture and proximity to RW. 2/4 DOE. HR in 110s. SpO2 in 90s on RA. Hgb 6.3 (refusal of blood products)   Stairs             Wheelchair Mobility    Modified Rankin (Stroke Patients Only)       Balance Overall balance assessment: Needs assistance Sitting-balance support: Feet supported;No upper extremity supported Sitting balance-Leahy Scale: Fair     Standing balance support: Bilateral upper extremity supported;During functional activity;Reliant on assistive device for balance Standing balance-Leahy Scale: Poor                              Cognition Arousal/Alertness: Awake/alert Behavior During Therapy: WFL for tasks assessed/performed Overall Cognitive Status: Impaired/Different from baseline Area of Impairment: Safety/judgement;Problem solving;Attention;Following commands                   Current Attention Level: Selective   Following Commands: Follows multi-step commands inconsistently;Follows multi-step commands with increased time     Problem Solving: Difficulty sequencing;Requires verbal cues;Requires tactile cues          Exercises      General Comments        Pertinent Vitals/Pain Pain Assessment: No/denies pain    Home Living  Prior Function            PT Goals (current goals can now be found in the care plan section) Acute Rehab PT Goals Patient Stated Goal: home Progress towards PT goals: Progressing toward goals    Frequency    Min  5X/week      PT Plan Discharge plan needs to be updated    Co-evaluation              AM-PAC PT "6 Clicks" Mobility   Outcome Measure  Help needed turning from your back to your side while in a flat bed without using bedrails?: A Little Help needed moving from lying on your back to sitting on the side of a flat bed without using bedrails?: A Lot Help needed moving to and from a bed to a chair (including a wheelchair)?: A Little Help needed standing up from a chair using your arms (e.g., wheelchair or bedside chair)?: A Little Help needed to walk in hospital room?: A Little Help needed climbing 3-5 steps with a railing? : Total 6 Click Score: 15    End of Session Equipment Utilized During Treatment: Gait belt;Back brace Activity Tolerance: Patient tolerated treatment well Patient left: in chair;with call bell/phone within reach;with chair alarm set Nurse Communication: Mobility status PT Visit Diagnosis: Other abnormalities of gait and mobility (R26.89);Muscle weakness (generalized) (M62.81)     Time: 5681-2751 PT Time Calculation (min) (ACUTE ONLY): 23 min  Charges:  $Gait Training: 23-37 mins                     Bryan [name], [place]  Office # [phone] Pager [phone]    Bryan [name] 10/16/2021, 9:21 AM

## 2021-10-16 NOTE — Telephone Encounter (Signed)
Contacted pt per Dr Irene Limbo: (pt currently inpatient post back surgery)  let patient know -would recommend taking iron polysaccharide 150 mg p.o. daily and if tolerated twice daily to try to target a ferritin level of 100 in the context of chronic inflammation from rheumatoid arthritis and his chronic kidney disease.  -B12 level still low resuming he is taking his 1000 mcg daily will increase the dose to 2000 mcg daily. PCP has called the iron in to his pharmacy Pt acknowledged and verbalized understanding.

## 2021-10-17 LAB — CBC
HCT: 20.5 % — ABNORMAL LOW (ref 39.0–52.0)
Hemoglobin: 6.5 g/dL — CL (ref 13.0–17.0)
MCH: 31.9 pg (ref 26.0–34.0)
MCHC: 31.7 g/dL (ref 30.0–36.0)
MCV: 100.5 fL — ABNORMAL HIGH (ref 80.0–100.0)
Platelets: 343 10*3/uL (ref 150–400)
RBC: 2.04 MIL/uL — ABNORMAL LOW (ref 4.22–5.81)
RDW: 16.1 % — ABNORMAL HIGH (ref 11.5–15.5)
WBC: 7.8 10*3/uL (ref 4.0–10.5)
nRBC: 0.3 % — ABNORMAL HIGH (ref 0.0–0.2)

## 2021-10-17 LAB — BASIC METABOLIC PANEL
Anion gap: 8 (ref 5–15)
BUN: 24 mg/dL — ABNORMAL HIGH (ref 8–23)
CO2: 24 mmol/L (ref 22–32)
Calcium: 7.8 mg/dL — ABNORMAL LOW (ref 8.9–10.3)
Chloride: 106 mmol/L (ref 98–111)
Creatinine, Ser: 1.42 mg/dL — ABNORMAL HIGH (ref 0.61–1.24)
GFR, Estimated: 51 mL/min — ABNORMAL LOW (ref >60)
Glucose, Bld: 92 mg/dL (ref 70–99)
Potassium: 3.3 mmol/L — ABNORMAL LOW (ref 3.5–5.1)
Sodium: 138 mmol/L (ref 135–145)

## 2021-10-17 LAB — MAGNESIUM: Magnesium: 1.9 mg/dL (ref 1.7–2.4)

## 2021-10-17 MED ORDER — POTASSIUM CHLORIDE CRYS ER 20 MEQ PO TBCR
40.0000 meq | EXTENDED_RELEASE_TABLET | Freq: Once | ORAL | Status: AC
  Administered 2021-10-17: 40 meq via ORAL
  Filled 2021-10-17: qty 2

## 2021-10-17 NOTE — TOC Progression Note (Signed)
Transition of Care [facility]) - Progression Note    Patient Details  Name: Bryan [name] MRN: [id number] Date of Birth: September 14, 1946  Transition [facility] CM/SW Contact  Emeterio Reeve, [place] Phone Number: 10/17/2021, 3:47 PM  Clinical Narrative:     Pt was faxed out to SNF's in the area. The SNF search can be expanded if needed to find a [facility] contracted facility.   Expected Discharge Plan: [place] Barriers to Discharge: Continued Medical Work up, SNF Pending bed offer  Expected Discharge Plan and Services Expected Discharge Plan: [place] In-house Referral: Clinical Social Work   Post Acute Care Choice: [place] Living arrangements for the past 2 months: Single Family Home                                       Social Determinants of Health (SDOH) Interventions    Readmission Risk Interventions No flowsheet data found.  Emeterio Reeve, LCSW Clinical Social Worker

## 2021-10-17 NOTE — Progress Notes (Signed)
Occupational Therapy Treatment Patient Details Name: Bryan [name] MRN: [id number] DOB: [birth year] Today's Date: 10/17/2021   History of present illness 75 yo male s/p L2-4 decompression and fusion, exploration of L4-S1 fusion, redo L5-S1 fusion on 11/22. Developed tachycardia up to 150s on 11/27, cardiology following. PMH includes OA, BPH, cancer, DM, obesity, RA, seizures, ACDF 2014, lap chole, lumbar surgeries 2016-2017.   OT comments  Pt demontrates ability to participate in activity for 26 minutes of adls including standing for oral care task. Pt completed room transfer as well with RW to show increased time to navigate a home environment of furniture. Pt requesting continued rehab prior to home d/c. Recommendation SNF at this time. Notes indicate CIR has denied.    Recommendations for follow up therapy are one component of a multi-disciplinary discharge planning process, led by the attending physician.  Recommendations may be updated based on patient status, additional functional criteria and insurance authorization.    Follow Up Recommendations  Skilled nursing-short term rehab (<3 hours/day)    Assistance Recommended at Discharge Intermittent Supervision/Assistance  Equipment Recommendations  BSC/3in1;Other (comment)    Recommendations for Other Services      Precautions / Restrictions Precautions Precautions: Fall;Back Required Braces or Orthoses: Spinal Brace Spinal Brace: Lumbar corset;Applied in sitting position       Mobility Bed Mobility               General bed mobility comments: oob on arrival    Transfers Overall transfer level: Needs assistance Equipment used: Rolling walker (2 wheels) Transfers: Sit to/from Stand Sit to Stand: Min assist           General transfer comment: cues for hand placement for descending to control lowering to chair and toilet     Balance Overall balance assessment: Needs assistance         Standing balance  support: Single extremity supported;During functional activity;Reliant on assistive device for balance Standing balance-Leahy Scale: Fair                             ADL either performed or assessed with clinical judgement   ADL Overall ADL's : Needs assistance/impaired     Grooming: Oral care;Standing;Wash/dry hands;Minimal assistance Grooming Details (indicate cue type and reason): pt using water pick in standing and setting up all parts ( roughly 12 step process to sequence) Pt needs (A) to open the oral pick attachment container.                 Toilet Transfer: Minimal assistance;Rolling walker (2 wheels);Regular Toilet   Toileting- Clothing Manipulation and Hygiene: Moderate assistance;Sitting/lateral lean       Functional mobility during ADLs: Min guard;Rolling walker (2 wheels)      Extremity/Trunk Assessment Upper Extremity Assessment Upper Extremity Assessment: Overall WFL for tasks assessed   Lower Extremity Assessment Lower Extremity Assessment: Defer to PT evaluation        Vision       Perception     Praxis      Cognition Arousal/Alertness: Awake/alert Behavior During Therapy: WFL for tasks assessed/performed Overall Cognitive Status: Impaired/Different from baseline Area of Impairment: Memory                     Memory: Decreased short-term memory         General Comments: pt repeating information during session twice that was provided.  Exercises     Shoulder Instructions       General Comments BP stable HR stable    Pertinent Vitals/ Pain       Pain Assessment: Faces Faces Pain Scale: Hurts a little bit Pain Location: back Pain Descriptors / Indicators: Operative site guarding Pain Intervention(s): Monitored during session;Repositioned  Home Living                                          Prior Functioning/Environment              Frequency  Min 2X/week         Progress Toward Goals  OT Goals(current goals can now be found in the care plan section)  Progress towards OT goals: Progressing toward goals;Goals met and updated - see care plan  Acute Rehab OT Goals Patient Stated Goal: to get rehab Time For Goal Achievement: 10/31/21 Potential to Achieve Goals: Good ADL Goals Pt Will Perform Grooming: with supervision;standing Pt Will Perform Lower Body Dressing: with supervision;with adaptive equipment;sit to/from stand Pt Will Transfer to Toilet: with min guard assist;bedside commode;ambulating Additional ADL Goal #1: pt will complete bed mobility supervision level for adls.  Plan Discharge plan remains appropriate    Co-evaluation                 AM-PAC OT "6 Clicks" Daily Activity     Outcome Measure   Help from another person eating meals?: A Little Help from another person taking care of personal grooming?: A Little Help from another person toileting, which includes using toliet, bedpan, or urinal?: A Little Help from another person bathing (including washing, rinsing, drying)?: A Lot Help from another person to put on and taking off regular upper body clothing?: A Little Help from another person to put on and taking off regular lower body clothing?: A Lot 6 Click Score: 16    End of Session Equipment Utilized During Treatment: Back brace;Rolling walker (2 wheels)  OT Visit Diagnosis: Unsteadiness on feet (R26.81);Muscle weakness (generalized) (M62.81);Pain   Activity Tolerance Patient tolerated treatment well   Patient Left in chair;with chair alarm set;with call bell/phone within reach   Nurse Communication Mobility status;Precautions        Time: 6244-6950 OT Time Calculation (min): 26 min  Charges: OT General Charges $OT Visit: 1 Visit OT Treatments $Self Care/Home Management : 23-37 mins   Brynn, OTR/L  Acute Rehabilitation Services Pager: [phone] Office: [phone] .    Jeri Modena 10/17/2021, 1:08 PM

## 2021-10-17 NOTE — Progress Notes (Signed)
Subjective: Patient reports  doing well minimal pain  Objective: Vital signs in last 24 hours: Temp:  [97.5 F (36.4 C)-98.8 F (37.1 C)] 98.6 F (37 C) (12/06 0258) Pulse Rate:  [75-78] 75 (12/06 0258) Resp:  [17-30] 20 (12/06 0258) BP: (127-133)/(55-69) 133/60 (12/06 0258) SpO2:  [98 %-100 %] 100 % (12/06 0258)  Intake/Output from previous day: 12/05 0701 - 12/06 0700 In: 1 [P.O.:720; IV Piggyback:100] Out: 1576 [Urine:1575; Stool:1] Intake/Output this shift: No intake/output data recorded.  Awake alert oriented strength out of 5 incision clean dry and intact  Lab Results: Recent Labs    10/16/21 0403 10/17/21 0236  WBC 8.6 7.8  HGB 6.3* 6.5*  HCT 19.9* 20.5*  PLT 360 343   BMET Recent Labs    10/16/21 0403 10/17/21 0236  NA 138 138  K 2.9* 3.3*  CL 105 106  CO2 24 24  GLUCOSE 102* 92  BUN 23 24*  CREATININE 1.40* 1.42*  CALCIUM 7.8* 7.8*    Studies/Results: No results found.  Assessment/Plan: Awaiting placement  LOS: 15 days     Elaina Hoops 10/17/2021, 7:42 AM

## 2021-10-17 NOTE — Progress Notes (Signed)
PROGRESS NOTE    Bryan [name]  SWN:[id number] DOB: 18-Feb-1946 DOA: 10/02/2021 PCP: [name], FNP   Brief Narrative:  75 year old male-year-old male with history of marginal zone B-cell lymphoma, BPH, OSA, hypertension, CAD, diabetes mellitus type 2, obesity, Jehovah's Witness underwent lumbar fusion with removal of hardware of L4 S1 on 11/21.  Hospital course was complicated by A. fib with RVR, confusion and fever with anemia and hemoglobin of 6.1 in last 2 days.  TRH was consulted on 11/28 for evaluation of fever and confusion.  Assessment & Plan:   Principal Problem:   Spinal stenosis of lumbar region Active Problems:   BPH (benign prostatic hyperplasia)   Essential hypertension   Rheumatoid arthritis ([place])   Refusal of blood transfusions as patient is Jehovah's Witness   Marginal zone lymphoma ([place])   Severe sepsis ([place])   New onset atrial fibrillation ([facility])   Acute blood loss anemia  Back pain: S/p lumbar fusion on 10/02/2021, management per primary/neurosurgery team.  BPH with urinary retention: Has Foley catheter and he is on Flomax.  Would recommend removing Foley catheter if there is no other indication to keep it going, defer to primary.  Tried to reach out to the primary via secure chat but Dr. Saintclair Halsted marked as " unavailable"  Postop fever: The main reason hospitalist were consulted, source remains unclear, all cultures negative.  Completed 7 days of Levaquin.  He is completely asymptomatic.  Postop transient atrial fibrillation: Resolved and now he is in normal sinus rhythm.  On Toprol-XL.  Not on anticoagulation for multiple reasons, brief episode, now in sinus rhythm and anemia, Jehovah's Witness.  Cardiology signed off many days ago and they will follow-up with them as outpatient.  Anemia of chronic disease: Hemoglobin between 6 and 7, he is refusing transfusion, he is Jehovah's Witness.  History of RA: Continue Plaquenil.  History of B-cell lymphoma:  Follow-up with oncology as outpatient.  History of CAD: Continue statin and beta-blocker.  Essential hypertension: Fairly controlled, continue Toprol-XL and as needed hydralazine.  Hypokalemia: 3.3 today.  Will replace.  AKI on CKD stage IIIa: Back at baseline.  Patient is medically stable from hospitalist perspective except persistent hypokalemia.  He has no complaints.  Hospitalist service will continue to follow peripherally and review chart and replace electrolytes.  Please let us know if patient needs to be seen physically.  Patient at this point in time is waiting for placement which is deferred to primary service/neurosurgery.  DVT prophylaxis: SCD's Start: 10/02/21 2125   Code Status: Full Code  Family Communication:  None present at bedside.  Plan of care discussed with patient in length and he verbalized understanding and agreed with it.  Status is: Inpatient  Remains inpatient appropriate because: Pending CIR admission.  Estimated body mass index is 36.71 kg/m as calculated from the following:   Height as of this encounter: 5' 5.5" (1.664 m).   Weight as of this encounter: 101.6 kg.     Nutritional Assessment: Body mass index is 36.71 kg/m.[name] Seen by dietician.  I agree with the assessment and plan as outlined below: Nutrition Status:        .  Skin Assessment: I have examined the patient's skin and I agree with the wound assessment as performed by the wound care RN as outlined below:    Consultants:  TRH  Procedures:  As above  Antimicrobials:  Anti-infectives (From admission, onward)    Start     Dose/Rate Route Frequency  Ordered Stop   10/10/21 1000  levofloxacin (LEVAQUIN) tablet 250 mg  Status:  Discontinued        250 mg Oral Daily 10/09/21 0811 10/16/21 1041   10/09/21 1000  levofloxacin (LEVAQUIN) tablet 500 mg  Status:  Discontinued        500 mg Oral Daily 10/09/21 0758 10/09/21 0811   10/09/21 1000  levofloxacin (LEVAQUIN) tablet 500 mg         500 mg Oral Daily 10/09/21 0811 10/09/21 1116   10/02/21 2145  ceFAZolin (ANCEF) IVPB 2g/100 mL premix        2 g 200 mL/hr over 30 Minutes Intravenous Every 8 hours 10/02/21 2124 10/04/21 1353   10/02/21 1045  ceFAZolin (ANCEF) IVPB 2g/100 mL premix        2 g 200 mL/hr over 30 Minutes Intravenous On call to O.R. 10/02/21 1037 10/02/21 1300          Subjective: Patient seen and examined.  He has no complaints.  Objective: Vitals:   10/16/21 2314 10/17/21 0135 10/17/21 0258 10/17/21 0814  BP:   133/60 115/60  Pulse: 77 78 75 82  Resp: (!) 30 17 20 19   Temp:   98.6 F (37 C) 98.4 F (36.9 C)  TempSrc:   Oral Oral  SpO2: 98%  100% 99%  Weight:      Height:        Intake/Output Summary (Last 24 hours) at 10/17/2021 1048 Last data filed at 10/17/2021 0720 Gross per 24 hour  Intake 940 ml  Output 1875 ml  Net -935 ml    Filed Weights   10/02/21 1041  Weight: 101.6 kg    Examination:  General exam: Appears calm and comfortable  Respiratory system: Clear to auscultation. Respiratory effort normal. Cardiovascular system: S1 & S2 heard, RRR. No JVD, murmurs, rubs, gallops or clicks. No pedal edema. Gastrointestinal system: Abdomen is nondistended, soft and nontender. No organomegaly or masses felt. Normal bowel sounds heard. Central nervous system: Alert and oriented. No focal neurological deficits. Extremities: Symmetric 5 x 5 power. Skin: No rashes, lesions or ulcers.  Psychiatry: Judgement and insight appear normal. Mood & affect appropriate.   Data Reviewed: I have personally reviewed following labs and imaging studies  CBC: Recent Labs  Lab 10/12/21 0153 10/13/21 0427 10/14/21 1631 10/16/21 0403 10/17/21 0236  WBC 10.0 10.0 10.4 8.6 7.8  HGB 6.1* 6.2* 6.7* 6.3* 6.5*  HCT 19.3* 19.0* 21.1* 19.9* 20.5*  MCV 97.0 95.0 99.5 99.0 100.5*  PLT 377 346 410* 360 497    Basic Metabolic Panel: Recent Labs  Lab 10/12/21 0153 10/13/21 0427 10/14/21 1631  10/16/21 0403 10/17/21 0236  NA 137 140 138 138 138  K 3.7 3.4* 3.2* 2.9* 3.3*  CL 104 106 105 105 106  CO2 24 25 25 24 24   GLUCOSE 141* 105* 132* 102* 92  BUN 38* 34* 32* 23 24*  CREATININE 1.68* 1.69* 1.48* 1.40* 1.42*  CALCIUM 8.3* 8.2* 8.1* 7.8* 7.8*  MG  --   --   --  1.3* 1.9    GFR: Estimated Creatinine Clearance: 49 mL/min (A) (by C-G formula based on SCr of 1.42 mg/dL (H)). Liver Function Tests: Recent Labs  Lab 10/11/21 0348 10/16/21 0403  AST 47* 29  ALT 36 23  ALKPHOS 181* 120  BILITOT 0.5 0.3  PROT 5.6* 5.2*  ALBUMIN 1.9* 2.0*    No results for input(s): LIPASE, AMYLASE in the last 168 hours. No results for input(s):  AMMONIA in the last 168 hours. Coagulation Profile: No results for input(s): INR, PROTIME in the last 168 hours. Cardiac Enzymes: No results for input(s): CKTOTAL, CKMB, CKMBINDEX, TROPONINI in the last 168 hours. BNP (last 3 results) No results for input(s): PROBNP in the last 8760 hours. HbA1C: No results for input(s): HGBA1C in the last 72 hours. CBG: No results for input(s): GLUCAP in the last 168 hours. Lipid Profile: No results for input(s): CHOL, HDL, LDLCALC, TRIG, CHOLHDL, LDLDIRECT in the last 72 hours. Thyroid Function Tests: No results for input(s): TSH, T4TOTAL, FREET4, T3FREE, THYROIDAB in the last 72 hours. Anemia Panel: No results for input(s): VITAMINB12, FOLATE, FERRITIN, TIBC, IRON, RETICCTPCT in the last 72 hours. Sepsis Labs: No results for input(s): PROCALCITON, LATICACIDVEN in the last 168 hours.  Recent Results (from the past 240 hour(s))  Urine Culture     Status: None   Collection Time: 10/08/21 10:15 AM   Specimen: Urine, Catheterized  Result Value Ref Range Status   Specimen Description URINE, CATHETERIZED  Final   Special Requests NONE  Final   Culture   Final    NO GROWTH Performed at [facility], [address] [place], [place] [address]    Report Status 10/09/2021 FINAL  Final  Culture,  blood (routine x 2)     Status: None   Collection Time: 10/09/21  2:26 AM   Specimen: BLOOD  Result Value Ref Range Status   Specimen Description BLOOD LEFT ANTECUBITAL  Final   Special Requests AEROBIC BOTTLE ONLY Blood Culture adequate volume  Final   Culture   Final    NO GROWTH 5 DAYS Performed at [facility] [address] [place], [place] [address]    Report Status 10/14/2021 FINAL  Final  Culture, blood (routine x 2)     Status: None   Collection Time: 10/09/21  2:26 AM   Specimen: BLOOD  Result Value Ref Range Status   Specimen Description BLOOD SITE NOT SPECIFIED  Final   Special Requests AEROBIC BOTTLE ONLY Blood Culture adequate volume  Final   Culture   Final    NO GROWTH 5 DAYS Performed at [facility], [address], [place], [place] [address]    Report Status 10/14/2021 FINAL  Final  Urine Culture     Status: None   Collection Time: 10/09/21 11:32 AM   Specimen: Urine, Catheterized  Result Value Ref Range Status   Specimen Description URINE, CATHETERIZED  Final   Special Requests NONE  Final   Culture   Final    NO GROWTH Performed at [facility] [address], [place], [place] [address]    Report Status 10/10/2021 FINAL  Final       Radiology Studies: No results found.  Scheduled Meds:  atorvastatin  20 mg Oral Daily   Chlorhexidine Gluconate Cloth  6 each Topical Daily   gabapentin  300 mg Oral BID   iron polysaccharides  150 mg Oral Daily   metoprolol succinate  100 mg Oral Daily   pantoprazole  40 mg Oral QHS   primidone  150 mg Oral QHS   sodium chloride flush  3 mL Intravenous Q12H   tamsulosin  0.4 mg Oral QHS   Continuous Infusions:  sodium chloride 250 mL (10/03/21 0533)   sodium chloride       LOS: 15 days   Time spent: 25 minutes   Darliss Cheney, MD [facility]  10/17/2021, 10:48 AM  Please page via [place] and do not message via secure chat for anything urgent. Secure chat can be used for anything  non urgent.  How to contact the [facility] Attending or Consulting provider [place] or covering provider during after hours [place], for this patient?  Check the care team in [facility] and look for a) attending/consulting [facility] provider listed and b) the [facility] team listed. Page or secure chat [phone]. Log into [email or web address] and use [place]'s universal password to access. If you do not have the password, please contact the hospital operator. Locate the [facility] provider you are looking for under [facility] and page to a number that you can be directly reached. If you still have difficulty reaching the provider, please page the [facility] (Director on Call) for the Hospitalists listed on amion for assistance.

## 2021-10-17 NOTE — Progress Notes (Addendum)
Inpatient Rehab Admissions Coordinator:   I spoke with Pt. And wife and let them know that [facility] is not [facility]. They state understanding and would like to pursue SNF. CIR will sign off  Clemens Catholic, [place], [place] Admissions Coordinator  [phone] (celll) [phone] (office)

## 2021-10-17 NOTE — Plan of Care (Signed)

## 2021-10-17 NOTE — Progress Notes (Signed)
Inpatient Rehab Admissions Coordinator:   I received notification from the [place] that they are not approving CIR at this time. They state Pt. Is unable to participate in 3 hours of therapy and state concerns that Pt.'s hgb and potassium levels are unstable. Pt. Will likely need an alternative dispo.   Clemens Catholic, [place], [place] Admissions Coordinator  [phone] ([place]) [phone] (office)

## 2021-10-18 ENCOUNTER — Inpatient Hospital Stay (HOSPITAL_COMMUNITY): Payer: No Typology Code available for payment source

## 2021-10-18 DIAGNOSIS — R609 Edema, unspecified: Secondary | ICD-10-CM

## 2021-10-18 LAB — BASIC METABOLIC PANEL
Anion gap: 10 (ref 5–15)
BUN: 27 mg/dL — ABNORMAL HIGH (ref 8–23)
CO2: 23 mmol/L (ref 22–32)
Calcium: 7.8 mg/dL — ABNORMAL LOW (ref 8.9–10.3)
Chloride: 107 mmol/L (ref 98–111)
Creatinine, Ser: 1.52 mg/dL — ABNORMAL HIGH (ref 0.61–1.24)
GFR, Estimated: 47 mL/min — ABNORMAL LOW (ref >60)
Glucose, Bld: 112 mg/dL — ABNORMAL HIGH (ref 70–99)
Potassium: 3.4 mmol/L — ABNORMAL LOW (ref 3.5–5.1)
Sodium: 140 mmol/L (ref 135–145)

## 2021-10-18 LAB — C DIFFICILE (CDIFF) QUICK SCRN (NO PCR REFLEX)
C Diff antigen: NEGATIVE
C Diff interpretation: NOT DETECTED
C Diff toxin: NEGATIVE

## 2021-10-18 LAB — MAGNESIUM: Magnesium: 1.6 mg/dL — ABNORMAL LOW (ref 1.7–2.4)

## 2021-10-18 MED ORDER — MAGNESIUM SULFATE 2 GM/50ML IV SOLN
2.0000 g | Freq: Once | INTRAVENOUS | Status: AC
  Administered 2021-10-18: 2 g via INTRAVENOUS
  Filled 2021-10-18: qty 50

## 2021-10-18 MED ORDER — POTASSIUM CHLORIDE CRYS ER 20 MEQ PO TBCR
40.0000 meq | EXTENDED_RELEASE_TABLET | Freq: Once | ORAL | Status: AC
  Administered 2021-10-18: 40 meq via ORAL
  Filled 2021-10-18: qty 2

## 2021-10-18 NOTE — Progress Notes (Signed)
Physical Therapy Treatment Patient Details Name: Bryan [name] MRN: [id number] DOB: 08-Apr-1946 Today's Date: 10/18/2021   History of Present Illness 75 yo male s/p L2-4 decompression and fusion, exploration of L4-S1 fusion, redo L5-S1 fusion on 11/22. Hospital course was complicated by A. fib with RVR (cardiology consulted and signed off now), confusion and fever with anemia (pt Jehov[employer] Witness and declines blood products). PMH includes OA, BPH, cancer, DM, obesity, RA, seizures, ACDF 2014, lap chole, lumbar surgeries 2016-2017.    PT Comments    Pt making gradual progress.  Does fatigue easily with DOE of 3/4.  Requiring min A for transfers and gait.  Noted insurance denied CIR.  Updated to SNF.  Note AMPAC score 16 indicating HH; however, do still recommend SNF due to easily fatigues, level of assist required -unable to be provided by spouse, and safety with back precautions, shortness of breath, and anemia.    Recommendations for follow up therapy are one component of a multi-disciplinary discharge planning process, led by the attending physician.  Recommendations may be updated based on patient status, additional functional criteria and insurance authorization.  Follow Up Recommendations  Skilled nursing-short term rehab (<3 hours/day)     Assistance Recommended at Discharge Frequent or constant Supervision/Assistance  Equipment Recommendations  Rolling walker (2 wheels);BSC/3in1    Recommendations for Other Services       Precautions / Restrictions Precautions Precautions: Fall;Back Required Braces or Orthoses: Spinal Brace Spinal Brace: Lumbar corset;Applied in sitting position     Mobility  Bed Mobility               General bed mobility comments: OOB at arrival    Transfers Overall transfer level: Needs assistance Equipment used: Rolling walker (2 wheels) Transfers: Sit to/from Stand Sit to Stand: Min assist           General transfer comment: Performed  8 x during session; Min A to rise and steady when hand transitioning to RW; cues for forward lean at hips to stand    Ambulation/Gait Ambulation/Gait assistance: Min guard Gait Distance (Feet): 120 Feet Assistive device: Rolling walker (2 wheels) Gait Pattern/deviations: Step-through pattern;Decreased stride length;Trunk flexed Gait velocity: decreased     General Gait Details: Min cues for RW management and RW proximity/posture; Pt does have 3/4 DOE but sats 93%   Stairs             Wheelchair Mobility    Modified Rankin (Stroke Patients Only)       Balance Overall balance assessment: Needs assistance Sitting-balance support: Feet supported;No upper extremity supported Sitting balance-Leahy Scale: Good     Standing balance support: Bilateral upper extremity supported;Reliant on assistive device for balance Standing balance-Leahy Scale: Poor Standing balance comment: requiring RW                            Cognition Arousal/Alertness: Awake/alert Behavior During Therapy: WFL for tasks assessed/performed Overall Cognitive Status: Within Functional Limits for tasks assessed                                 General Comments: Pt recalled precautions without cues        Exercises General Exercises - Lower Extremity Long Arc Quad: AROM;Both;10 reps;Seated Hip Flexion/Marching: AROM;Both;10 reps;Seated (with focus on gentle core contraction) Heel Raises: AROM;Both;10 reps;Standing (RW and min guard for balance) Other Exercises Other Exercises: Standing marching  with RW and min guard x 10 bil Other Exercises: Sit to stand x 5 in a row for exercise with cues to flex at hips then min A  to rise and steady when transitioning hands to RW Other Exercises: Seated reclined to upright with gentle use of arms, hinging at hips (back brace in place) x 10    General Comments        Pertinent Vitals/Pain Pain Assessment: 0-10 Pain Score: 4  Pain  Location: incision site Pain Descriptors / Indicators: Sore Pain Intervention(s): Monitored during session;Limited activity within patient's tolerance    Home Living                          Prior Function            PT Goals (current goals can now be found in the care plan section) Progress towards PT goals: Progressing toward goals    Frequency    Min 5X/week      PT Plan Discharge plan needs to be updated (Insurance Denied CIR)    Co-evaluation              AM-PAC PT "6 Clicks" Mobility   Outcome Measure  Help needed turning from your back to your side while in a flat bed without using bedrails?: A Little Help needed moving from lying on your back to sitting on the side of a flat bed without using bedrails?: A Little Help needed moving to and from a bed to a chair (including a wheelchair)?: A Little Help needed standing up from a chair using your arms (e.g., wheelchair or bedside chair)?: A Little Help needed to walk in hospital room?: A Little Help needed climbing 3-5 steps with a railing? : Total 6 Click Score: 16    End of Session Equipment Utilized During Treatment: Gait belt;Back brace Activity Tolerance: Patient tolerated treatment well Patient left: with chair alarm set;in chair;with call bell/phone within reach Nurse Communication: Mobility status PT Visit Diagnosis: Other abnormalities of gait and mobility (R26.89);Muscle weakness (generalized) (M62.81)     Time: 8264-1583 PT Time Calculation (min) (ACUTE ONLY): 30 min  Charges:  $Gait Training: 8-22 mins $Therapeutic Exercise: 8-22 mins                     Abran Richard, PT Acute Rehab Services Pager [phone] [facility] [place] 10/18/2021, 3:14 PM

## 2021-10-18 NOTE — Progress Notes (Signed)
Late entry.  Data entered by Leighton Roach, PT yesterday afternoon but information was never pulled into a note on. Note entered 10/18/21 by Lavonia Dana,  PT     10/17/21 1627  PT Visit Information  Last PT Received On 10/17/21  Assistance Needed +1  History of Present Illness 75 yo male s/p L2-4 decompression and fusion, exploration of L4-S1 fusion, redo L5-S1 fusion on 11/22. Developed tachycardia up to 150s on 11/27, cardiology following. PMH includes OA, BPH, cancer, DM, obesity, RA, seizures, ACDF 2014, lap chole, lumbar surgeries 2016-2017.  Subjective Data  Subjective pt says he has been denied CIR because he can't tolerate 3 hours of therapy a day but he thinks that he can do that if broken into sessions. He is also concerned about catheter still being in.  Patient Stated Goal home  Precautions  Precautions Fall;Back  Precaution Booklet Issued No  Precaution Comments pt able to mobilize to EOB keeping precautions  Required Braces or Orthoses Spinal Brace  Spinal Brace Lumbar corset;Applied in sitting position  Restrictions  Weight Bearing Restrictions No  Pain Assessment  Pain Assessment Faces  Faces Pain Scale 2  Pain Location back  Pain Descriptors / Indicators Sore  Pain Intervention(s) Limited activity within patient's tolerance;Monitored during session  Cognition  Arousal/Alertness Awake/alert  Behavior During Therapy WFL for tasks assessed/performed  Overall Cognitive Status Impaired/Different from baseline  Current Attention Level Alternating  Following Commands Follows multi-step commands with increased time  Safety/Judgement Decreased awareness of deficits;Decreased awareness of safety  Problem Solving Difficulty sequencing;Requires verbal cues;Requires tactile cues  General Comments pt showing good recall today. Mildly delayed processing  Bed Mobility  Overal bed mobility Needs Assistance  Bed Mobility Rolling;Sidelying to Sit;Sit to Supine  Rolling  Supervision  Sidelying to sit Min assist  Sit to supine Min assist  General bed mobility comments min A last 10% of SL to sit and increased time needed. Pt needed min A for LE's back into bed with return to supine  Transfers  Overall transfer level Needs assistance  Equipment used Rolling walker (2 wheels)  Transfers Sit to/from Stand  Sit to Stand Min guard  General transfer comment increased time for fwd wt shift, min support given through this process  Ambulation/Gait  Ambulation/Gait assistance Min guard  Gait Distance (Feet) 120 Feet  Assistive device Rolling walker (2 wheels)  Gait Pattern/deviations Step-through pattern;Decreased stride length;Trunk flexed  General Gait Details had pt stop each 30' to stand with erect posture. cues for abdominal activation within brace. VSS  Gait velocity decreased  Gait velocity interpretation 1.31 - 2.62 ft/sec, indicative of limited community ambulator  Balance  Overall balance assessment Needs assistance  Sitting-balance support Feet supported;No upper extremity supported  Sitting balance-Leahy Scale Good  Sitting balance - Comments able to sit EOB and wt shift fwd/ back  Standing balance support Single extremity supported;During functional activity;Reliant on assistive device for balance  Standing balance-Leahy Scale Fair  Standing balance comment able to let go of RW to don mask, min A given for safety  Exercises  Exercises General Lower Extremity  General Exercises - Lower Extremity  Heel Slides Right;10 reps;AROM;Seated (with manual resistance)  Straight Leg Raises AROM;Supine;Right;10 reps  Long [address] [place];Right (with manual resistance)  PT - End of Session  Equipment Utilized During Treatment Gait belt;Back brace  Activity Tolerance Patient tolerated treatment well  Patient left with call bell/phone within reach;in bed;with bed alarm set  Nurse Communication Mobility status   PT -  Assessment/Plan  PT Plan Current  plan remains appropriate  PT Visit Diagnosis Other abnormalities of gait and mobility (R26.89);Muscle weakness (generalized) (M62.81)  PT Frequency (ACUTE ONLY) Min 5X/week  Follow Up Recommendations Acute inpatient rehab (3hours/day)  Assistance recommended at discharge Frequent or constant Supervision/Assistance  PT equipment Rolling walker (2 ENIDPO);EUM/3NT6  AM-PAC PT "6 Clicks" Mobility Outcome Measure (Version 2)  Help needed turning from your back to your side while in a flat bed without using bedrails? 3  Help needed moving from lying on your back to sitting on the side of a flat bed without using bedrails? 3  Help needed moving to and from a bed to a chair (including a wheelchair)? 3  Help needed standing up from a chair using your arms (e.g., wheelchair or bedside chair)? 3  Help needed to walk in hospital room? 3  Help needed climbing 3-5 steps with a railing?  1  6 Click Score 16  Consider Recommendation of Discharge To: Home with [facility]  Progressive Mobility  What is the highest level of mobility based on the progressive mobility assessment? Level 5 (Walks with assist in room/hall) - Balance while stepping forward/back and can walk in room with assist - Complete  Mobility Ambulated with assistance in hallway  PT Goal Progression  Progress towards PT goals Progressing toward goals  Acute Rehab PT Goals  PT Goal Formulation With patient  Time For Goal Achievement 10/31/21  Potential to Achieve Goals Good  PT Time Calculation  PT Start Time (ACUTE ONLY) 1457  PT Stop Time (ACUTE ONLY) 1529  PT Time Calculation (min) (ACUTE ONLY) 32 min  PT General Charges  $$ ACUTE PT VISIT 1 Visit  PT Treatments  $Gait Training 8-22 mins  $Therapeutic Exercise 8-22 mins   Lavonia Dana, PT   Acute Rehabilitation Services  Pager [phone] Office [phone] 10/18/2021

## 2021-10-18 NOTE — Progress Notes (Signed)
Bilateral lower extremity venous duplex has been completed. Preliminary results can be found in CV Proc through chart review.  Results were given to the patient's nurse, Lauren.  10/18/21 11:58 AM Carlos Levering RVT

## 2021-10-18 NOTE — Progress Notes (Signed)
PROGRESS NOTE    Bryan [name]  ZWC:[id number] DOB: 1945-11-21 DOA: 10/02/2021 PCP: [name], FNP   Brief Narrative:  75 year old male-year-old male with history of marginal zone B-cell lymphoma, BPH, OSA, hypertension, CAD, diabetes mellitus type 2, obesity, Jehovah's Witness underwent lumbar fusion with removal of hardware of L4 S1 on 11/21.  Hospital course was complicated by A. fib with RVR, confusion and fever with anemia and hemoglobin of 6.1 in last 2 days.  TRH was consulted on 11/28 for evaluation of fever and confusion.  Assessment & Plan:   Principal Problem:   Spinal stenosis of lumbar region Active Problems:   BPH (benign prostatic hyperplasia)   Essential hypertension   Rheumatoid arthritis ([place])   Refusal of blood transfusions as patient is Jehovah's Witness   Marginal zone lymphoma ([place])   Severe sepsis ([place])   New onset atrial fibrillation ([facility])   Acute blood loss anemia  Back pain: S/p lumbar fusion on 10/02/2021, management per primary/neurosurgery team.  BPH with urinary retention: Has Foley catheter and he is on Flomax.  Would recommend removing Foley catheter if there is no other indication to keep it going, defer to primary.    Postop fever: The main reason hospitalist were consulted, source remains unclear, all cultures negative.  Completed 7 days of Levaquin.  He is completely asymptomatic.  Postop transient atrial fibrillation: Resolved and now he is in normal sinus rhythm.  On Toprol-XL.  Not on anticoagulation for multiple reasons, brief episode, now in sinus rhythm and anemia, Jehovah's Witness.  Cardiology signed off many days ago and they will follow-up with them as outpatient.  Anemia of chronic disease: Hemoglobin between 6 and 7, he is refusing transfusion, he is Jehovah's Witness.  History of RA: Continue Plaquenil.  History of B-cell lymphoma: Follow-up with oncology as outpatient.  History of CAD: Continue statin and  beta-blocker.  Essential hypertension: Fairly controlled, continue Toprol-XL and as needed hydralazine.  Hypokalemia: 3.4 today.  Will replace.  Hypomagnesemia: We will replace.  AKI on CKD stage IIIa: Back at baseline.  Mucus from the rectum: Reportedly, patient has intermittent reports of mucus from rectum but no diarrhea or loose stools and this is going on for several days, I was informed only this morning.  I do not think this is sign of C. difficile however I see that primary is concerned about C. difficile and have ordered the test already.  Dyspnea on exertion: Patient complained of some shortness of breath with exertion and has very minimal end expiratory wheezes on exam.  Some edema bilateral lower extremity.  Doppler lower extremity already completed and ruled out DVT.  Chest x-ray shows no signs of pulmonary edema or infiltrates but instead it shows possibility of atelectasis which could very well be the cause of wheezes.  Patient is not on chemical prophylaxis due to severe anemia and he is not excepting blood transfusion due to being Jehovah's Witness.  It would be unsafe to start him on any chemical prophylaxis, I will order SCD.  I doubt PE.  I will order incentive spirometry.  Patient is medically stable from hospitalist perspective except persistent hypokalemia.  He has no complaints.  Hospitalist service will continue to follow peripherally and review chart and replace electrolytes.  Please let us know if patient needs to be seen physically.  Patient at this point in time is waiting for placement which is deferred to primary service/neurosurgery.  DVT prophylaxis: SCD's Start: 10/02/21 2125   Code  Status: Full Code  Family Communication:  None present at bedside.  Plan of care discussed with patient in length and he verbalized understanding and agreed with it.  Status is: Inpatient  Remains inpatient appropriate because: Pending CIR admission.  Estimated body mass index is  36.71 kg/m as calculated from the following:   Height as of this encounter: 5' 5.5" (1.664 m).   Weight as of this encounter: 101.6 kg.     Nutritional Assessment: Body mass index is 36.71 kg/m.[name] Seen by dietician.  I agree with the assessment and plan as outlined below: Nutrition Status:        .  Skin Assessment: I have examined the patient's skin and I agree with the wound assessment as performed by the wound care RN as outlined below:    Consultants:  TRH  Procedures:  As above  Antimicrobials:  Anti-infectives (From admission, onward)    Start     Dose/Rate Route Frequency Ordered Stop   10/10/21 1000  levofloxacin (LEVAQUIN) tablet 250 mg  Status:  Discontinued        250 mg Oral Daily 10/09/21 0811 10/16/21 1041   10/09/21 1000  levofloxacin (LEVAQUIN) tablet 500 mg  Status:  Discontinued        500 mg Oral Daily 10/09/21 0758 10/09/21 0811   10/09/21 1000  levofloxacin (LEVAQUIN) tablet 500 mg        500 mg Oral Daily 10/09/21 0811 10/09/21 1116   10/02/21 2145  ceFAZolin (ANCEF) IVPB 2g/100 mL premix        2 g 200 mL/hr over 30 Minutes Intravenous Every 8 hours 10/02/21 2124 10/04/21 1353   10/02/21 1045  ceFAZolin (ANCEF) IVPB 2g/100 mL premix        2 g 200 mL/hr over 30 Minutes Intravenous On call to O.R. 10/02/21 1037 10/02/21 1300          Subjective:  Seen and examined earlier.  Complained of some shortness of breath.  On exam very minimal wheezes at end expiration.  No other complaint.  Objective: Vitals:   10/18/21 0352 10/18/21 0725 10/18/21 1023 10/18/21 1150  BP: (!) 127/58 (!) 142/70  130/68  Pulse: 82 91  79  Resp: 18 18  18   Temp: 98.3 F (36.8 C) 98.4 F (36.9 C)  98.6 F (37 C)  TempSrc: Oral Oral  Oral  SpO2: 99% 99% 96% 98%  Weight:      Height:        Intake/Output Summary (Last 24 hours) at 10/18/2021 1314 Last data filed at 10/17/2021 2100 Gross per 24 hour  Intake 120 ml  Output --  Net 120 ml    Filed Weights    10/02/21 1041  Weight: 101.6 kg    Examination:  General exam: Appears calm and comfortable  Respiratory system: Minimal end expiratory wheezes.  Respiratory effort normal. Cardiovascular system: S1 & S2 heard, RRR. No JVD, murmurs, rubs, gallops or clicks.  Mild edema lower extremities. Gastrointestinal system: Abdomen is nondistended, soft and nontender. No organomegaly or masses felt. Normal bowel sounds heard. Central nervous system: Alert and oriented. No focal neurological deficits. Extremities: Symmetric 5 x 5 power. Skin: No rashes, lesions or ulcers.  Psychiatry: Judgement and insight appear normal. Mood & affect appropriate.   Data Reviewed: I have personally reviewed following labs and imaging studies  CBC: Recent Labs  Lab 10/12/21 0153 10/13/21 0427 10/14/21 1631 10/16/21 0403 10/17/21 0236  WBC 10.0 10.0 10.4 8.6 7.8  HGB 6.1*  6.2* 6.7* 6.3* 6.5*  HCT 19.3* 19.0* 21.1* 19.9* 20.5*  MCV 97.0 95.0 99.5 99.0 100.5*  PLT 377 346 410* 360 710    Basic Metabolic Panel: Recent Labs  Lab 10/13/21 0427 10/14/21 1631 10/16/21 0403 10/17/21 0236 10/18/21 0302  NA 140 138 138 138 140  K 3.4* 3.2* 2.9* 3.3* 3.4*  CL 106 105 105 106 107  CO2 25 25 24 24 23   GLUCOSE 105* 132* 102* 92 112*  BUN 34* 32* 23 24* 27*  CREATININE 1.69* 1.48* 1.40* 1.42* 1.52*  CALCIUM 8.2* 8.1* 7.8* 7.8* 7.8*  MG  --   --  1.3* 1.9 1.6*    GFR: Estimated Creatinine Clearance: 45.8 mL/min (A) (by C-G formula based on SCr of 1.52 mg/dL (H)). Liver Function Tests: Recent Labs  Lab 10/16/21 0403  AST 29  ALT 23  ALKPHOS 120  BILITOT 0.3  PROT 5.2*  ALBUMIN 2.0*    No results for input(s): LIPASE, AMYLASE in the last 168 hours. No results for input(s): AMMONIA in the last 168 hours. Coagulation Profile: No results for input(s): INR, PROTIME in the last 168 hours. Cardiac Enzymes: No results for input(s): CKTOTAL, CKMB, CKMBINDEX, TROPONINI in the last 168 hours. BNP (last  3 results) No results for input(s): PROBNP in the last 8760 hours. HbA1C: No results for input(s): HGBA1C in the last 72 hours. CBG: No results for input(s): GLUCAP in the last 168 hours. Lipid Profile: No results for input(s): CHOL, HDL, LDLCALC, TRIG, CHOLHDL, LDLDIRECT in the last 72 hours. Thyroid Function Tests: No results for input(s): TSH, T4TOTAL, FREET4, T3FREE, THYROIDAB in the last 72 hours. Anemia Panel: No results for input(s): VITAMINB12, FOLATE, FERRITIN, TIBC, IRON, RETICCTPCT in the last 72 hours. Sepsis Labs: No results for input(s): PROCALCITON, LATICACIDVEN in the last 168 hours.  Recent Results (from the past 240 hour(s))  Culture, blood (routine x 2)     Status: None   Collection Time: 10/09/21  2:26 AM   Specimen: BLOOD  Result Value Ref Range Status   Specimen Description BLOOD LEFT ANTECUBITAL  Final   Special Requests AEROBIC BOTTLE ONLY Blood Culture adequate volume  Final   Culture   Final    NO GROWTH 5 DAYS Performed at [facility], [name] [place], [place] [address]    Report Status 10/14/2021 FINAL  Final  Culture, blood (routine x 2)     Status: None   Collection Time: 10/09/21  2:26 AM   Specimen: BLOOD  Result Value Ref Range Status   Specimen Description BLOOD SITE NOT SPECIFIED  Final   Special Requests AEROBIC BOTTLE ONLY Blood Culture adequate volume  Final   Culture   Final    NO GROWTH 5 DAYS Performed at [facility], [address], [place], [place] [address]    Report Status 10/14/2021 FINAL  Final  Urine Culture     Status: None   Collection Time: 10/09/21 11:32 AM   Specimen: Urine, Catheterized  Result Value Ref Range Status   Specimen Description URINE, CATHETERIZED  Final   Special Requests NONE  Final   Culture   Final    NO GROWTH Performed at [facility] [address] [place], [place] [address]    Report Status 10/10/2021 FINAL  Final       Radiology Studies: DG Chest Port 1  View  Result Date: 10/18/2021 CLINICAL DATA:  Shortness of breath EXAM: PORTABLE CHEST 1 VIEW COMPARISON:  10/09/2021 FINDINGS: Transverse diameter of heart is increased. There are no signs of pulmonary edema or focal pulmonary consolidation. There is poor inspiration. Increased markings are seen in the lower lung fields, more so on the left side. There is blunting of lateral CP angles. There is no pneumothorax. There is previous surgical fusion in the lower cervical spine. IMPRESSION: Increased markings are seen in the lower lung fields, more so on the left side. This may be due to crowding of bronchovascular structures caused by poor inspiration or atelectasis/pneumonitis. Electronically Signed   By: Elmer Picker M.D.   On: 10/18/2021 12:52   VAS [place] LOWER EXTREMITY VENOUS (DVT)  Result Date: 10/18/2021  Lower Venous DVT Study Patient Name:  ASAEL PANN  Date of Exam:   10/18/2021 Medical Rec #: [id number]      Accession #:    [id number] Date of Birth: 03-Feb-1946     Patient Gender: M Patient Age:   75 years Exam Location:  [facility] Procedure:      VAS [place] LOWER EXTREMITY VENOUS (DVT) Referring Phys: Kary Kos --------------------------------------------------------------------------------  Indications: Edema.  Risk Factors: Surgery. Limitations: Poor ultrasound/tissue interface and body habitus. Comparison Study: No prior studies. Performing Technologist: Oliver Hum RVT  Examination Guidelines: A complete evaluation includes B-mode imaging, spectral Doppler, color Doppler, and power Doppler as needed of all accessible portions of each vessel. Bilateral testing is considered an integral part of a complete examination. Limited examinations for reoccurring indications may be performed as noted. The reflux portion of the exam is performed with the patient in reverse Trendelenburg.  +---------+---------------+---------+-----------+----------+--------------+ RIGHT     CompressibilityPhasicitySpontaneityPropertiesThrombus Aging +---------+---------------+---------+-----------+----------+--------------+ CFV      Full           Yes      Yes                                 +---------+---------------+---------+-----------+----------+--------------+ SFJ      Full                                                        +---------+---------------+---------+-----------+----------+--------------+ FV Prox  Full                                                        +---------+---------------+---------+-----------+----------+--------------+ FV Mid   Full                                                        +---------+---------------+---------+-----------+----------+--------------+ FV DistalFull                                                        +---------+---------------+---------+-----------+----------+--------------+ PFV      Full                                                        +---------+---------------+---------+-----------+----------+--------------+  POP      Full           Yes      Yes                                 +---------+---------------+---------+-----------+----------+--------------+ PTV      Full                                                        +---------+---------------+---------+-----------+----------+--------------+ PERO     Full                                                        +---------+---------------+---------+-----------+----------+--------------+   +---------+---------------+---------+-----------+----------+--------------+ LEFT     CompressibilityPhasicitySpontaneityPropertiesThrombus Aging +---------+---------------+---------+-----------+----------+--------------+ CFV      Full           Yes      Yes                                 +---------+---------------+---------+-----------+----------+--------------+ SFJ      Full                                                         +---------+---------------+---------+-----------+----------+--------------+ FV Prox  Full                                                        +---------+---------------+---------+-----------+----------+--------------+ FV Mid   Full                                                        +---------+---------------+---------+-----------+----------+--------------+ FV DistalFull                                                        +---------+---------------+---------+-----------+----------+--------------+ PFV      Full                                                        +---------+---------------+---------+-----------+----------+--------------+ POP      Full           Yes      Yes                                 +---------+---------------+---------+-----------+----------+--------------+  PTV      Full                                                        +---------+---------------+---------+-----------+----------+--------------+ PERO     Full                                                        +---------+---------------+---------+-----------+----------+--------------+    Summary: RIGHT: - There is no evidence of deep vein thrombosis in the lower extremity. However, portions of this examination were limited- see technologist comments above.  - No cystic structure found in the popliteal fossa.  LEFT: - There is no evidence of deep vein thrombosis in the lower extremity. However, portions of this examination were limited- see technologist comments above.  - No cystic structure found in the popliteal fossa.  *See table(s) above for measurements and observations.    Preliminary     Scheduled Meds:  atorvastatin  20 mg Oral Daily   Chlorhexidine Gluconate Cloth  6 each Topical Daily   gabapentin  300 mg Oral BID   iron polysaccharides  150 mg Oral Daily   metoprolol succinate  100 mg Oral Daily   pantoprazole  40 mg Oral QHS   primidone  150  mg Oral QHS   sodium chloride flush  3 mL Intravenous Q12H   tamsulosin  0.4 mg Oral QHS   Continuous Infusions:  sodium chloride 250 mL (10/03/21 0533)   sodium chloride       LOS: 16 days   Time spent: 26 minutes   Darliss Cheney, MD [facility]  10/18/2021, 1:14 PM  Please page via [place] and do not message via secure chat for anything urgent. Secure chat can be used for anything non urgent.  How to contact the [facility] Attending or Consulting provider [place] or covering provider during after hours [place], for this patient?  Check the care team in [facility] and look for a) attending/consulting [facility] provider listed and b) the [facility] team listed. Page or secure chat [phone]. Log into [email or web address] and use [place]'s universal password to access. If you do not have the password, please contact the hospital operator. Locate the [facility] provider you are looking for under [facility] and page to a number that you can be directly reached. If you still have difficulty reaching the provider, please page the [facility] (Director on Call) for the Hospitalists listed on amion for assistance.

## 2021-10-18 NOTE — Plan of Care (Signed)

## 2021-10-18 NOTE — Progress Notes (Signed)
Subjective: Patient reports  doing okay with pain but some shortness of breath and wheezing as well as swelling in his legs.  Objective: Vital signs in last 24 hours: Temp:  [98 F (36.7 C)-98.8 F (37.1 C)] 98.4 F (36.9 C) (12/07 0725) Pulse Rate:  [73-91] 91 (12/07 0725) Resp:  [18-25] 18 (12/07 0725) BP: (121-142)/(50-70) 142/70 (12/07 0725) SpO2:  [96 %-100 %] 96 % (12/07 1023)  Intake/Output from previous day: 12/06 0701 - 12/07 0700 In: 480 [P.O.:480] Out: 600 [Urine:600] Intake/Output this shift: No intake/output data recorded.  Neurologically stable some wheezing and legs are very swollen.  Lab Results: Recent Labs    10/16/21 0403 10/17/21 0236  WBC 8.6 7.8  HGB 6.3* 6.5*  HCT 19.9* 20.5*  PLT 360 343   BMET Recent Labs    10/17/21 0236 10/18/21 0302  NA 138 140  K 3.3* 3.4*  CL 106 107  CO2 24 23  GLUCOSE 92 112*  BUN 24* 27*  CREATININE 1.42* 1.52*  CALCIUM 7.8* 7.8*    Studies/Results: No results found.  Assessment/Plan: Postop day 16 lumbar fusion increase shortness of breath and wheezing today and swelling in his legs.  Concern for DVT will order stat venous duplex of lower extremities anticoagulation would need to be initiated for suspicious for PE despite the fact that he has been very anemic and that is why we held off on prophylaxis.  Will defer to medicine on manner in form of anticoagulation but is far enough out from surgery that we can do full anticoagulation as needed.  In addition patient having some clear intermittently watery stools unclear whether this represents C. difficile infection will ask medicine to weigh in on that as well.  Once these issues get resolved patient would be stable enough for discharge to rehab  LOS: 16 days     Elaina Hoops 10/18/2021, 10:50 AM

## 2021-10-19 LAB — BASIC METABOLIC PANEL
Anion gap: 8 (ref 5–15)
BUN: 20 mg/dL (ref 8–23)
CO2: 21 mmol/L — ABNORMAL LOW (ref 22–32)
Calcium: 7.8 mg/dL — ABNORMAL LOW (ref 8.9–10.3)
Chloride: 106 mmol/L (ref 98–111)
Creatinine, Ser: 1.21 mg/dL (ref 0.61–1.24)
GFR, Estimated: >60 mL/min (ref >60)
Glucose, Bld: 98 mg/dL (ref 70–99)
Potassium: 3.3 mmol/L — ABNORMAL LOW (ref 3.5–5.1)
Sodium: 135 mmol/L (ref 135–145)

## 2021-10-19 LAB — RESP PANEL BY RT-PCR (FLU A&B, COVID) ARPGX2
Influenza A by PCR: NEGATIVE
Influenza B by PCR: NEGATIVE
SARS Coronavirus 2 by RT PCR: NEGATIVE

## 2021-10-19 LAB — MAGNESIUM: Magnesium: 1.6 mg/dL — ABNORMAL LOW (ref 1.7–2.4)

## 2021-10-19 MED ORDER — MAGNESIUM SULFATE 2 GM/50ML IV SOLN
2.0000 g | Freq: Once | INTRAVENOUS | Status: AC
  Administered 2021-10-19: 2 g via INTRAVENOUS
  Filled 2021-10-19: qty 50

## 2021-10-19 MED ORDER — POTASSIUM CHLORIDE CRYS ER 20 MEQ PO TBCR
40.0000 meq | EXTENDED_RELEASE_TABLET | ORAL | Status: AC
  Administered 2021-10-19 (×2): 40 meq via ORAL
  Filled 2021-10-19 (×2): qty 2

## 2021-10-19 NOTE — Progress Notes (Signed)
PROGRESS NOTE    HEMAN QUE  MWN:[id number] DOB: 1946-01-20 DOA: 10/02/2021 PCP: [name], FNP   Brief Narrative:  75 year old male-year-old male with history of marginal zone B-cell lymphoma, BPH, OSA, hypertension, CAD, diabetes mellitus type 2, obesity, Jehovah's Witness underwent lumbar fusion with removal of hardware of L4 S1 on 11/21.  Hospital course was complicated by A. fib with RVR, confusion and fever with anemia and hemoglobin of 6.1 in last 2 days.  TRH was consulted on 11/28 for evaluation of fever and confusion.  Assessment & Plan:   Principal Problem:   Spinal stenosis of lumbar region Active Problems:   BPH (benign prostatic hyperplasia)   Essential hypertension   Rheumatoid arthritis ([place])   Refusal of blood transfusions as patient is Jehovah's Witness   Marginal zone lymphoma ([place])   Severe sepsis ([place])   New onset atrial fibrillation ([facility])   Acute blood loss anemia  Back pain: S/p lumbar fusion on 10/02/2021, management per primary/neurosurgery team.  BPH with urinary retention: Has Foley catheter and he is on Flomax.  Would recommend removing Foley catheter if there is no other indication to keep it going, defer to primary.    Postop fever: The main reason hospitalist were consulted, source remains unclear, all cultures negative.  Completed 7 days of Levaquin.  He is completely asymptomatic.  Postop transient atrial fibrillation: Resolved and now he is in normal sinus rhythm.  On Toprol-XL.  Not on anticoagulation for multiple reasons, brief episode, now in sinus rhythm and anemia, Jehovah's Witness.  Cardiology signed off many days ago and they will follow-up with them as outpatient.  Anemia of chronic disease: Hemoglobin between 6 and 7, he is refusing transfusion, he is Jehovah's Witness.  History of RA: Continue Plaquenil.  History of B-cell lymphoma: Follow-up with oncology as outpatient.  History of CAD: Continue statin and  beta-blocker.  Essential hypertension: Fairly controlled, continue Toprol-XL and as needed hydralazine.  Hypokalemia: We will replace again.  Hypomagnesemia: Low, will replace again.  AKI on CKD stage IIIa: Back at baseline.  Mucus from the rectum: C. difficile ruled out.  Dyspnea on exertion: Chest x-ray shows atelectasis.  He has very mild expiratory wheezes only at bases bilaterally.  Patient is not taking deep breaths due to back pain.  He had incentive spirometry but he is not using every hour as he is supposed to.  I highly encouraged that.  DVT was ruled out.  He has not been hypoxic.  Hopefully with religious use of incentive spirometry, his atelectasis will resolve.  Patient is medically stable from hospitalist perspective except electrolytes abnormalities.  He has no complaints.  Hospitalist service will continue to follow peripherally and review chart and replace electrolytes.  Please let us know if patient needs to be seen physically.  Patient at this point in time is waiting for placement and disposition deferred to primary service/neurosurgery.  DVT prophylaxis: Place and maintain sequential compression device Start: 10/18/21 1317 SCD's Start: 10/02/21 2125   Code Status: Full Code  Family Communication:  None present at bedside.  Plan of care discussed with patient in length and he verbalized understanding and agreed with it.  Status is: Inpatient  Remains inpatient appropriate because: Pending CIR admission.  Estimated body mass index is 36.71 kg/m as calculated from the following:   Height as of this encounter: 5' 5.5" (1.664 m).   Weight as of this encounter: 101.6 kg.     Nutritional Assessment: Body mass index  is 36.71 kg/m.[name] Seen by dietician.  I agree with the assessment and plan as outlined below: Nutrition Status:        .  Skin Assessment: I have examined the patient's skin and I agree with the wound assessment as performed by the wound care RN as  outlined below:    Consultants:  TRH  Procedures:  As above  Antimicrobials:  Anti-infectives (From admission, onward)    Start     Dose/Rate Route Frequency Ordered Stop   10/10/21 1000  levofloxacin (LEVAQUIN) tablet 250 mg  Status:  Discontinued        250 mg Oral Daily 10/09/21 0811 10/16/21 1041   10/09/21 1000  levofloxacin (LEVAQUIN) tablet 500 mg  Status:  Discontinued        500 mg Oral Daily 10/09/21 0758 10/09/21 0811   10/09/21 1000  levofloxacin (LEVAQUIN) tablet 500 mg        500 mg Oral Daily 10/09/21 0811 10/09/21 1116   10/02/21 2145  ceFAZolin (ANCEF) IVPB 2g/100 mL premix        2 g 200 mL/hr over 30 Minutes Intravenous Every 8 hours 10/02/21 2124 10/04/21 1353   10/02/21 1045  ceFAZolin (ANCEF) IVPB 2g/100 mL premix        2 g 200 mL/hr over 30 Minutes Intravenous On call to O.R. 10/02/21 1037 10/02/21 1300          Subjective:  Seen and examined, he has no complaints today.  Objective: Vitals:   10/18/21 2338 10/19/21 0354 10/19/21 0700 10/19/21 1141  BP: 135/61 133/60 (!) 150/70 137/66  Pulse: 88 88 89 79  Resp: 15 18 19 19   Temp: 99.7 F (37.6 C) 98.6 F (37 C) 98.9 F (37.2 C) 98.7 F (37.1 C)  TempSrc: Oral Oral Oral Oral  SpO2: 99% 100% 97% 99%  Weight:      Height:        Intake/Output Summary (Last 24 hours) at 10/19/2021 1311 Last data filed at 10/19/2021 0558 Gross per 24 hour  Intake 683 ml  Output 2500 ml  Net -1817 ml    Filed Weights   10/02/21 1041  Weight: 101.6 kg    Examination:  General exam: Appears calm and comfortable  Respiratory system: Minimal end expiratory wheezes at bases bilaterally. Respiratory effort normal. Cardiovascular system: S1 & S2 heard, RRR. No JVD, murmurs, rubs, gallops or clicks.  Trace pitting edema bilateral lower extremity. Gastrointestinal system: Abdomen is nondistended, soft and nontender. No organomegaly or masses felt. Normal bowel sounds heard. Central nervous system: Alert and  oriented. No focal neurological deficits. Extremities: Symmetric 5 x 5 power. Skin: No rashes, lesions or ulcers.  Psychiatry: Judgement and insight appear normal. Mood & affect appropriate.    Data Reviewed: I have personally reviewed following labs and imaging studies  CBC: Recent Labs  Lab 10/13/21 0427 10/14/21 1631 10/16/21 0403 10/17/21 0236  WBC 10.0 10.4 8.6 7.8  HGB 6.2* 6.7* 6.3* 6.5*  HCT 19.0* 21.1* 19.9* 20.5*  MCV 95.0 99.5 99.0 100.5*  PLT 346 410* 360 975    Basic Metabolic Panel: Recent Labs  Lab 10/14/21 1631 10/16/21 0403 10/17/21 0236 10/18/21 0302 10/19/21 0334  NA 138 138 138 140 135  K 3.2* 2.9* 3.3* 3.4* 3.3*  CL 105 105 106 107 106  CO2 25 24 24 23  21*  GLUCOSE 132* 102* 92 112* 98  BUN 32* 23 24* 27* 20  CREATININE 1.48* 1.40* 1.42* 1.52* 1.21  CALCIUM 8.1*  7.8* 7.8* 7.8* 7.8*  MG  --  1.3* 1.9 1.6* 1.6*    GFR: Estimated Creatinine Clearance: 57.5 mL/min (by C-G formula based on SCr of 1.21 mg/dL). Liver Function Tests: Recent Labs  Lab 10/16/21 0403  AST 29  ALT 23  ALKPHOS 120  BILITOT 0.3  PROT 5.2*  ALBUMIN 2.0*    No results for input(s): LIPASE, AMYLASE in the last 168 hours. No results for input(s): AMMONIA in the last 168 hours. Coagulation Profile: No results for input(s): INR, PROTIME in the last 168 hours. Cardiac Enzymes: No results for input(s): CKTOTAL, CKMB, CKMBINDEX, TROPONINI in the last 168 hours. BNP (last 3 results) No results for input(s): PROBNP in the last 8760 hours. HbA1C: No results for input(s): HGBA1C in the last 72 hours. CBG: No results for input(s): GLUCAP in the last 168 hours. Lipid Profile: No results for input(s): CHOL, HDL, LDLCALC, TRIG, CHOLHDL, LDLDIRECT in the last 72 hours. Thyroid Function Tests: No results for input(s): TSH, T4TOTAL, FREET4, T3FREE, THYROIDAB in the last 72 hours. Anemia Panel: No results for input(s): VITAMINB12, FOLATE, FERRITIN, TIBC, IRON, RETICCTPCT in  the last 72 hours. Sepsis Labs: No results for input(s): PROCALCITON, LATICACIDVEN in the last 168 hours.  Recent Results (from the past 240 hour(s))  C Difficile Quick Screen (NO PCR Reflex)     Status: None   Collection Time: 10/18/21  1:10 PM   Specimen: STOOL  Result Value Ref Range Status   C Diff antigen NEGATIVE NEGATIVE Final   C Diff toxin NEGATIVE NEGATIVE Final   C Diff interpretation No C. difficile detected.  Final    Comment: Performed at [facility], [place] [address], [place], [place] [address]       Radiology Studies: DG Chest Port 1 View  Result Date: 10/18/2021 CLINICAL DATA:  Shortness of breath EXAM: PORTABLE CHEST 1 VIEW COMPARISON:  10/09/2021 FINDINGS: Transverse diameter of heart is increased. There are no signs of pulmonary edema or focal pulmonary consolidation. There is poor inspiration. Increased markings are seen in the lower lung fields, more so on the left side. There is blunting of lateral CP angles. There is no pneumothorax. There is previous surgical fusion in the lower cervical spine. IMPRESSION: Increased markings are seen in the lower lung fields, more so on the left side. This may be due to crowding of bronchovascular structures caused by poor inspiration or atelectasis/pneumonitis. Electronically Signed   By: Elmer Picker M.D.   On: 10/18/2021 12:52   VAS [place] LOWER EXTREMITY VENOUS (DVT)  Result Date: 10/18/2021  Lower Venous DVT Study Patient Name:  ELBERT SPICKLER  Date of Exam:   10/18/2021 Medical Rec #: [id number]      Accession #:    [id number] Date of Birth: 1946-08-14     Patient Gender: M Patient Age:   21 years Exam Location:  [facility] Procedure:      VAS [place] LOWER EXTREMITY VENOUS (DVT) Referring Phys: Kary Kos --------------------------------------------------------------------------------  Indications: Edema.  Risk Factors: Surgery. Limitations: Poor ultrasound/tissue interface and body habitus. Comparison Study: No prior  studies. Performing Technologist: Oliver Hum RVT  Examination Guidelines: A complete evaluation includes B-mode imaging, spectral Doppler, color Doppler, and power Doppler as needed of all accessible portions of each vessel. Bilateral testing is considered an integral part of a complete examination. Limited examinations for reoccurring indications may be performed as noted. The reflux portion of the exam is performed with the patient in reverse Trendelenburg.  +---------+---------------+---------+-----------+----------+--------------+  RIGHT    CompressibilityPhasicitySpontaneityPropertiesThrombus Aging +---------+---------------+---------+-----------+----------+--------------+ CFV      Full           Yes      Yes                                 +---------+---------------+---------+-----------+----------+--------------+ SFJ      Full                                                        +---------+---------------+---------+-----------+----------+--------------+ FV Prox  Full                                                        +---------+---------------+---------+-----------+----------+--------------+ FV Mid   Full                                                        +---------+---------------+---------+-----------+----------+--------------+ FV DistalFull                                                        +---------+---------------+---------+-----------+----------+--------------+ PFV      Full                                                        +---------+---------------+---------+-----------+----------+--------------+ POP      Full           Yes      Yes                                 +---------+---------------+---------+-----------+----------+--------------+ PTV      Full                                                        +---------+---------------+---------+-----------+----------+--------------+ PERO     Full                                                         +---------+---------------+---------+-----------+----------+--------------+   +---------+---------------+---------+-----------+----------+--------------+ LEFT     CompressibilityPhasicitySpontaneityPropertiesThrombus Aging +---------+---------------+---------+-----------+----------+--------------+ CFV      Full           Yes      Yes                                 +---------+---------------+---------+-----------+----------+--------------+  SFJ      Full                                                        +---------+---------------+---------+-----------+----------+--------------+ FV Prox  Full                                                        +---------+---------------+---------+-----------+----------+--------------+ FV Mid   Full                                                        +---------+---------------+---------+-----------+----------+--------------+ FV DistalFull                                                        +---------+---------------+---------+-----------+----------+--------------+ PFV      Full                                                        +---------+---------------+---------+-----------+----------+--------------+ POP      Full           Yes      Yes                                 +---------+---------------+---------+-----------+----------+--------------+ PTV      Full                                                        +---------+---------------+---------+-----------+----------+--------------+ PERO     Full                                                        +---------+---------------+---------+-----------+----------+--------------+    Summary: RIGHT: - There is no evidence of deep vein thrombosis in the lower extremity. However, portions of this examination were limited- see technologist comments above.  - No cystic structure found in the popliteal fossa.  LEFT:  - There is no evidence of deep vein thrombosis in the lower extremity. However, portions of this examination were limited- see technologist comments above.  - No cystic structure found in the popliteal fossa.  *See table(s) above for measurements and observations.    Preliminary     Scheduled Meds:  atorvastatin  20 mg Oral Daily   Chlorhexidine Gluconate Cloth  6 each Topical Daily  gabapentin  300 mg Oral BID   iron polysaccharides  150 mg Oral Daily   metoprolol succinate  100 mg Oral Daily   pantoprazole  40 mg Oral QHS   potassium chloride  40 mEq Oral Q4H   primidone  150 mg Oral QHS   sodium chloride flush  3 mL Intravenous Q12H   tamsulosin  0.4 mg Oral QHS   Continuous Infusions:  sodium chloride 250 mL (10/03/21 0533)   sodium chloride       LOS: 17 days   Time spent: 25 minutes   Darliss Cheney, MD [facility]  10/19/2021, 1:11 PM  Please page via [place] and do not message via secure chat for anything urgent. Secure chat can be used for anything non urgent.  How to contact the [facility] Attending or Consulting provider [place] or covering provider during after hours [place], for this patient?  Check the care team in [facility] and look for a) attending/consulting TR[facility] provider listed and b) the [facility] team listed. Page or secure chat [phone]. Log into [email or web address] and use [place]'s universal password to access. If you do not have the password, please contact the hospital operator. Locate the [facility] provider you are looking for under [facility] and page to a number that you can be directly reached. If you still have difficulty reaching the provider, please page the [facility] (Director on Call) for the Hospitalists listed on amion for assistance.

## 2021-10-19 NOTE — Progress Notes (Signed)
Physical Therapy Treatment Patient Details Name: Bryan [name] MRN: [id number] DOB: 1946/02/15 Today's Date: 10/19/2021   History of Present Illness 75 yo male s/p L2-4 decompression and fusion, exploration of L4-S1 fusion, redo L5-S1 fusion on 10/03/21. Hospital course was complicated by A. fib with RVR (cardiology consulted and signed off now), confusion and fever with anemia (pt Jehovia Witness and declines blood products). PMH includes OA, BPH, cancer, DM, obesity, RA, seizures, ACDF 2014, lap chole, lumbar surgeries 2016-2017.    PT Comments    Pt is progressing well with gait and mobility.  He is min to min guard assist overall to mobilize with a RW and has increased his gait distance.  He continues to wheeze quite a bit during gait, so we practiced the IS at the end of our session while up in the chair.  If he has min to min guard assist to help him mobilize when up at home he could return home at discharge, if he does not, he remains appropriate for SNF level rehab.   Recommendations for follow up therapy are one component of a multi-disciplinary discharge planning process, led by the attending physician.  Recommendations may be updated based on patient status, additional functional criteria and insurance authorization.  Follow Up Recommendations  Skilled nursing-short term rehab (<3 hours/day)     Assistance Recommended at Discharge Frequent or constant Supervision/Assistance  Equipment Recommendations  Rolling walker (2 wheels);BSC/3in1    Recommendations for Other Services       Precautions / Restrictions Precautions Precautions: Fall;Back Precaution Booklet Issued: No Required Braces or Orthoses: Spinal Brace Spinal Brace: Lumbar corset;Applied in sitting position     Mobility  Bed Mobility Overal bed mobility: Needs Assistance Bed Mobility: Rolling;Sidelying to Sit;Sit to Supine Rolling: Min assist Sidelying to sit: Min assist       General bed mobility  comments: Min assist to help pt reach for rail, cues for knee flexion to preform log roll, support at trunk to come up to sitting EOB.    Transfers Overall transfer level: Needs assistance Equipment used: Rolling walker (2 wheels) Transfers: Sit to/from Stand Sit to Stand: Min assist           General transfer comment: Min assist to support trunk to come up to standing EOB.    Ambulation/Gait Ambulation/Gait assistance: Min guard Gait Distance (Feet): 130 Feet Assistive device: Rolling walker (2 wheels) Gait Pattern/deviations: Step-through pattern;Shuffle;Trunk flexed       General Gait Details: cues for upright posture and to stay inside of the RW with his body.   Stairs             Wheelchair Mobility    Modified Rankin (Stroke Patients Only)       Balance Overall balance assessment: Needs assistance Sitting-balance support: Feet supported;No upper extremity supported Sitting balance-Leahy Scale: Good     Standing balance support: Bilateral upper extremity supported Standing balance-Leahy Scale: Poor Standing balance comment: needs external support in standing.                            Cognition Arousal/Alertness: Awake/alert Behavior During Therapy: WFL for tasks assessed/performed Overall Cognitive Status: Within Functional Limits for tasks assessed                                 General Comments: Not specifically tested, conversation normal.  Exercises Other Exercises Other Exercises: incentive spirometer x 10 reps, cues to slow speed, max inspired volume 1200 mL, pt was wheezy during gait reporting more than baseline.  Pt was able to accurately report to perform 10 an hour.    General Comments        Pertinent Vitals/Pain Pain Assessment: Faces Faces Pain Scale: Hurts even more Pain Location: incision site Pain Descriptors / Indicators: Grimacing;Guarding Pain Intervention(s): Limited activity within  patient's tolerance;Monitored during session;Repositioned    Home Living                          Prior Function            PT Goals (current goals can now be found in the care plan section) Progress towards PT goals: Progressing toward goals    Frequency    Min 5X/week      PT Plan Current plan remains appropriate    Co-evaluation              AM-PAC PT "6 Clicks" Mobility   Outcome Measure  Help needed turning from your back to your side while in a flat bed without using bedrails?: A Little Help needed moving from lying on your back to sitting on the side of a flat bed without using bedrails?: A Little Help needed moving to and from a bed to a chair (including a wheelchair)?: A Little Help needed standing up from a chair using your arms (e.g., wheelchair or bedside chair)?: A Little Help needed to walk in hospital room?: A Little Help needed climbing 3-5 steps with a railing? : A Little 6 Click Score: 18    End of Session Equipment Utilized During Treatment: Back brace Activity Tolerance: Patient tolerated treatment well Patient left: in chair;with call bell/phone within reach;with chair alarm set   PT Visit Diagnosis: Other abnormalities of gait and mobility (R26.89);Muscle weakness (generalized) (M62.81)     Time: 4562-5638 PT Time Calculation (min) (ACUTE ONLY): 30 min  Charges:  $Gait Training: 23-37 mins                     Verdene Lennert, PT, DPT  Acute Rehabilitation Ortho Tech Supervisor [phone] pager [phone]) [phone] office

## 2021-10-19 NOTE — Progress Notes (Signed)
Occupational Therapy Treatment Patient Details Name: Bryan [name] MRN: [id number] DOB: 05/19/1946 Today's Date: 10/19/2021   History of present illness 75 yo male s/p L2-4 decompression and fusion, exploration of L4-S1 fusion, redo L5-S1 fusion on 11/22. Hospital course was complicated by A. fib with RVR (cardiology consulted and signed off now), confusion and fever with anemia (pt [employer] and declines blood products). PMH includes OA, BPH, cancer, DM, obesity, RA, seizures, ACDF 2014, lap chole, lumbar surgeries 2016-2017.   OT comments  Patient received in recliner and wife present.  Patient instructed on reacher use for doffing socks and wide sock aide to donn socks. Patient was unclear on AE when mentioned but was able to recognize when presented and required min verbal cues for use.  Gown changed per patient request with min assist. Patient was min assist for sit to stand and min guard to ambulate to toilet with mod assist for toilet hygiene standing. Treatment completed with patient in recliner. Patient making good progress with OT treatment. Acute OT to continue to follow.    Recommendations for follow up therapy are one component of a multi-disciplinary discharge planning process, led by the attending physician.  Recommendations may be updated based on patient status, additional functional criteria and insurance authorization.    Follow Up Recommendations  Skilled nursing-short term rehab (<3 hours/day)    Assistance Recommended at Discharge Intermittent Supervision/Assistance  Equipment Recommendations  BSC/3in1;Other (comment)    Recommendations for Other Services      Precautions / Restrictions Precautions Precautions: Fall;Back Precaution Booklet Issued: No Precaution Comments: pt able to mobilize to EOB keeping precautions Required Braces or Orthoses: Spinal Brace Spinal Brace: Lumbar corset;Applied in sitting position Restrictions Weight Bearing Restrictions: No        Mobility Bed Mobility Overal bed mobility: Needs Assistance             General bed mobility comments: recieved in recliner    Transfers Overall transfer level: Needs assistance Equipment used: Rolling walker (2 wheels) Transfers: Sit to/from Stand Sit to Stand: Min assist           General transfer comment: performed toilet transfer and returned to recliner     Balance Overall balance assessment: Needs assistance Sitting-balance support: Feet supported;No upper extremity supported Sitting balance-Leahy Scale: Good     Standing balance support: Bilateral upper extremity supported;Reliant on assistive device for balance Standing balance-Leahy Scale: Poor Standing balance comment: requires RW during mobilty, able to wash both hands standing at sink                           ADL either performed or assessed with clinical judgement   ADL Overall ADL's : Needs assistance/impaired     Grooming: Wash/dry hands;Standing;Minimal assistance Grooming Details (indicate cue type and reason): washed hands at sink following toileting with assistance for paper towels         Upper Body Dressing : Minimal assistance Upper Body Dressing Details (indicate cue type and reason): changed gown in recliner with assistance for brace and gown Lower Body Dressing: Moderate assistance;With adaptive equipment Lower Body Dressing Details (indicate cue type and reason): used wide sock aide and reacher to doff and donn socks. Patient required fewer cues to perform Toilet Transfer: Minimal assistance;Rolling walker (2 wheels);Regular Glass blower/designer Details (indicate cue type and reason): cues for safety Toileting- Clothing Manipulation and Hygiene: Moderate assistance;Sitting/lateral lean Toileting - Clothing Manipulation Details (indicate cue type and  reason): patient stood with RW during toilet hygiene     Functional mobility during ADLs: Min guard;Rolling walker  (2 wheels) General ADL Comments: patient able to recall adaptive equipment once presented and required fewer uses for use    Extremity/Trunk Assessment              Vision       Perception     Praxis      Cognition Arousal/Alertness: Awake/alert Behavior During Therapy: WFL for tasks assessed/performed Overall Cognitive Status: Within Functional Limits for tasks assessed Area of Impairment: Memory                   Current Attention Level: Alternating Memory: Decreased short-term memory Following Commands: Follows multi-step commands with increased time Safety/Judgement: Decreased awareness of deficits;Decreased awareness of safety   Problem Solving: Difficulty sequencing;Requires verbal cues;Requires tactile cues General Comments: able to recall precautions, better recall on adaptive equipment          Exercises     Shoulder Instructions       General Comments      Pertinent Vitals/ Pain       Pain Assessment: Faces Faces Pain Scale: Hurts a little bit Pain Location: incision site Pain Descriptors / Indicators: Sore Pain Intervention(s): Monitored during session;Repositioned  Home Living                                          Prior Functioning/Environment              Frequency  Min 2X/week        Progress Toward Goals  OT Goals(current goals can now be found in the care plan section)  Progress towards OT goals: Progressing toward goals  Acute Rehab OT Goals Patient Stated Goal: get better Time For Goal Achievement: 10/31/21 Potential to Achieve Goals: Good ADL Goals Pt Will Perform Grooming: with supervision;standing Pt Will Perform Lower Body Dressing: with supervision;with adaptive equipment;sit to/from stand Pt Will Transfer to Toilet: with min guard assist;bedside commode;ambulating Additional ADL Goal #1: pt will complete bed mobility supervision level for adls.  Plan Discharge plan remains appropriate     Co-evaluation                 AM-PAC OT "6 Clicks" Daily Activity     Outcome Measure   Help from another person eating meals?: A Little Help from another person taking care of personal grooming?: A Little Help from another person toileting, which includes using toliet, bedpan, or urinal?: A Little Help from another person bathing (including washing, rinsing, drying)?: A Lot Help from another person to put on and taking off regular upper body clothing?: A Little Help from another person to put on and taking off regular lower body clothing?: A Lot 6 Click Score: 16    End of Session Equipment Utilized During Treatment: Back brace;Rolling walker (2 wheels)  OT Visit Diagnosis: Unsteadiness on feet (R26.81);Muscle weakness (generalized) (M62.81);Pain   Activity Tolerance Patient tolerated treatment well   Patient Left in chair;with chair alarm set;with call bell/phone within reach   Nurse Communication Mobility status        Time: 5284-1324 OT Time Calculation (min): 34 min  Charges: OT General Charges $OT Visit: 1 Visit OT Evaluation $OT Eval Low Complexity: 1 Low OT Treatments $Self Care/Home Management : 23-37 mins  Lodema Hong, OTA Acute Rehabilitation  Services  Pager [phone] Office [phone]   Trixie Dredge 10/19/2021, 9:30 AM

## 2021-10-19 NOTE — Progress Notes (Signed)
Subjective: Patient reports  episode of dizzyness when got out fo bed  but no fever no pain  Objective: Vital signs in last 24 hours: Temp:  [98.5 F (36.9 C)-99.7 F (37.6 C)] 98.9 F (37.2 C) (12/08 0700) Pulse Rate:  [79-89] 89 (12/08 0700) Resp:  [14-20] 19 (12/08 0700) BP: (130-156)/(59-77) 150/70 (12/08 0700) SpO2:  [96 %-100 %] 97 % (12/08 0700)  Intake/Output from previous day: 12/07 0701 - 12/08 0700 In: 1163 [P.O.:1160; I.V.:3] Out: 2500 [Urine:2500] Intake/Output this shift: No intake/output data recorded.  Awake alert neuro stable, wound dry.  Leg swelling better  Lab Results: Recent Labs    10/17/21 0236  WBC 7.8  HGB 6.5*  HCT 20.5*  PLT 343   BMET Recent Labs    10/18/21 0302 10/19/21 0334  NA 140 135  K 3.4* 3.3*  CL 107 106  CO2 23 21*  GLUCOSE 112* 98  BUN 27* 20  CREATININE 1.52* 1.21  CALCIUM 7.8* 7.8*    Studies/Results: DG Chest Port 1 View  Result Date: 10/18/2021 CLINICAL DATA:  Shortness of breath EXAM: PORTABLE CHEST 1 VIEW COMPARISON:  10/09/2021 FINDINGS: Transverse diameter of heart is increased. There are no signs of pulmonary edema or focal pulmonary consolidation. There is poor inspiration. Increased markings are seen in the lower lung fields, more so on the left side. There is blunting of lateral CP angles. There is no pneumothorax. There is previous surgical fusion in the lower cervical spine. IMPRESSION: Increased markings are seen in the lower lung fields, more so on the left side. This may be due to crowding of bronchovascular structures caused by poor inspiration or atelectasis/pneumonitis. Electronically Signed   By: Elmer Picker M.D.   On: 10/18/2021 12:52   VAS [place] LOWER EXTREMITY VENOUS (DVT)  Result Date: 10/18/2021  Lower Venous DVT Study Patient Name:  Bryan [name]  Date of Exam:   10/18/2021 Medical Rec #: [id number]      Accession #:    [id number] Date of Birth: 08/29/1946     Patient Gender: M Patient Age:   75  years Exam Location:  [facility] Procedure:      VAS [place] LOWER EXTREMITY VENOUS (DVT) Referring Phys: Kary Kos --------------------------------------------------------------------------------  Indications: Edema.  Risk Factors: Surgery. Limitations: Poor ultrasound/tissue interface and body habitus. Comparison Study: No prior studies. Performing Technologist: Oliver Hum RVT  Examination Guidelines: A complete evaluation includes B-mode imaging, spectral Doppler, color Doppler, and power Doppler as needed of all accessible portions of each vessel. Bilateral testing is considered an integral part of a complete examination. Limited examinations for reoccurring indications may be performed as noted. The reflux portion of the exam is performed with the patient in reverse Trendelenburg.  +---------+---------------+---------+-----------+----------+--------------+ RIGHT    CompressibilityPhasicitySpontaneityPropertiesThrombus Aging +---------+---------------+---------+-----------+----------+--------------+ CFV      Full           Yes      Yes                                 +---------+---------------+---------+-----------+----------+--------------+ SFJ      Full                                                        +---------+---------------+---------+-----------+----------+--------------+ FV Prox  Full                                                        +---------+---------------+---------+-----------+----------+--------------+ FV Mid   Full                                                        +---------+---------------+---------+-----------+----------+--------------+ FV DistalFull                                                        +---------+---------------+---------+-----------+----------+--------------+ PFV      Full                                                        +---------+---------------+---------+-----------+----------+--------------+  POP      Full           Yes      Yes                                 +---------+---------------+---------+-----------+----------+--------------+ PTV      Full                                                        +---------+---------------+---------+-----------+----------+--------------+ PERO     Full                                                        +---------+---------------+---------+-----------+----------+--------------+   +---------+---------------+---------+-----------+----------+--------------+ LEFT     CompressibilityPhasicitySpontaneityPropertiesThrombus Aging +---------+---------------+---------+-----------+----------+--------------+ CFV      Full           Yes      Yes                                 +---------+---------------+---------+-----------+----------+--------------+ SFJ      Full                                                        +---------+---------------+---------+-----------+----------+--------------+ FV Prox  Full                                                        +---------+---------------+---------+-----------+----------+--------------+  FV Mid   Full                                                        +---------+---------------+---------+-----------+----------+--------------+ FV DistalFull                                                        +---------+---------------+---------+-----------+----------+--------------+ PFV      Full                                                        +---------+---------------+---------+-----------+----------+--------------+ POP      Full           Yes      Yes                                 +---------+---------------+---------+-----------+----------+--------------+ PTV      Full                                                        +---------+---------------+---------+-----------+----------+--------------+ PERO     Full                                                         +---------+---------------+---------+-----------+----------+--------------+    Summary: RIGHT: - There is no evidence of deep vein thrombosis in the lower extremity. However, portions of this examination were limited- see technologist comments above.  - No cystic structure found in the popliteal fossa.  LEFT: - There is no evidence of deep vein thrombosis in the lower extremity. However, portions of this examination were limited- see technologist comments above.  - No cystic structure found in the popliteal fossa.  *See table(s) above for measurements and observations.    Preliminary     Assessment/Plan: Unclear etiology oif dizzyness, but overall looks good...will see how he does with PT, should be able to transfer to SNF when bed available  LOS: 17 days     Elaina Hoops 10/19/2021, 8:02 AM

## 2021-10-19 NOTE — TOC Progression Note (Incomplete)
Transition of Care [facility]) - Progression Note    Patient Details  Name: Bryan [name] MRN: [id number] Date of Birth: 1946-04-17  Transition of Care [facility]) CM/SW [place], [place] Phone Number: 10/19/2021, 3:48 PM  Clinical Narrative:     CSW spoke with patient's wife,Bryan [name]. She inquired about CIR using [employer] medicare benefits. CSW explained, TOC was advised by admission coordinator, not sure if the patient could tolerate the level of intensity and questionable if [employer] would approve- CSW was given permission to send out SNF referrals with [facility].   CSW sent referral to [facility] waiting on response.  Insurance auth started - Government social research officer updated and covid test requested   Expected Discharge Plan: Skilled Nursing Facility Barriers to Discharge: Continued Medical Work up, SNF Pending bed offer  Expected Discharge Plan and Services Expected Discharge Plan: [place] In-house Referral: Clinical Social Work   Post Acute Care Choice: [place] Living arrangements for the past 2 months: Single Family Home                                       Social Determinants of Health (SDOH) Interventions    Readmission Risk Interventions No flowsheet data found.

## 2021-10-20 LAB — MAGNESIUM: Magnesium: 1.8 mg/dL (ref 1.7–2.4)

## 2021-10-20 LAB — BASIC METABOLIC PANEL
Anion gap: 9 (ref 5–15)
BUN: 19 mg/dL (ref 8–23)
CO2: 23 mmol/L (ref 22–32)
Calcium: 7.8 mg/dL — ABNORMAL LOW (ref 8.9–10.3)
Chloride: 106 mmol/L (ref 98–111)
Creatinine, Ser: 1.22 mg/dL (ref 0.61–1.24)
GFR, Estimated: >60 mL/min (ref >60)
Glucose, Bld: 93 mg/dL (ref 70–99)
Potassium: 3.8 mmol/L (ref 3.5–5.1)
Sodium: 138 mmol/L (ref 135–145)

## 2021-10-20 MED ORDER — METHOCARBAMOL 500 MG PO TABS: 500.0000 mg | ORAL_TABLET | Freq: Four times a day (QID) | ORAL | 0 refills | Status: DC

## 2021-10-20 MED ORDER — OXYCODONE-ACETAMINOPHEN 5-325 MG PO TABS: 1.0000 | ORAL_TABLET | ORAL | 0 refills | Status: DC | PRN

## 2021-10-20 NOTE — Progress Notes (Signed)
Subjective: Patient reports  patient doing well ready for discharge  Objective: Vital signs in last 24 hours: Temp:  [98 F (36.7 C)-99.5 F (37.5 C)] 98.7 F (37.1 C) (12/09 0735) Pulse Rate:  [79-104] 104 (12/09 0735) Resp:  [17-22] 20 (12/09 0735) BP: (128-155)/(62-75) 140/63 (12/09 0735) SpO2:  [96 %-99 %] 99 % (12/09 0735)  Intake/Output from previous day: 12/08 0701 - 12/09 0700 In: 5784 [P.O.:1040; I.V.:3] Out: 480 [Urine:480] Intake/Output this shift: Total I/O In: -  Out: 400 [Urine:400]  Strength 5 out of 5 wound clean dry and intact  Lab Results: No results for input(s): WBC, HGB, HCT, PLT in the last 72 hours. BMET Recent Labs    10/19/21 0334 10/20/21 0243  NA 135 138  K 3.3* 3.8  CL 106 106  CO2 21* 23  GLUCOSE 98 93  BUN 20 19  CREATININE 1.21 1.22  CALCIUM 7.8* 7.8*    Studies/Results: DG Chest Port 1 View  Result Date: 10/18/2021 CLINICAL DATA:  Shortness of breath EXAM: PORTABLE CHEST 1 VIEW COMPARISON:  10/09/2021 FINDINGS: Transverse diameter of heart is increased. There are no signs of pulmonary edema or focal pulmonary consolidation. There is poor inspiration. Increased markings are seen in the lower lung fields, more so on the left side. There is blunting of lateral CP angles. There is no pneumothorax. There is previous surgical fusion in the lower cervical spine. IMPRESSION: Increased markings are seen in the lower lung fields, more so on the left side. This may be due to crowding of bronchovascular structures caused by poor inspiration or atelectasis/pneumonitis. Electronically Signed   By: Elmer Picker M.D.   On: 10/18/2021 12:52   VAS [place] LOWER EXTREMITY VENOUS (DVT)  Result Date: 10/19/2021  Lower Venous DVT Study Patient Name:  Bryan [name]  Date of Exam:   10/18/2021 Medical Rec #: [id number]      Accession #:    [id number] Date of Birth: 02-Feb-1946     Patient Gender: M Patient Age:   75 years Exam Location:  [facility]  Procedure:      VAS [place] LOWER EXTREMITY VENOUS (DVT) Referring Phys: Kary Kos --------------------------------------------------------------------------------  Indications: Edema.  Risk Factors: Surgery. Limitations: Poor ultrasound/tissue interface and body habitus. Comparison Study: No prior studies. Performing Technologist: Oliver Hum RVT  Examination Guidelines: A complete evaluation includes B-mode imaging, spectral Doppler, color Doppler, and power Doppler as needed of all accessible portions of each vessel. Bilateral testing is considered an integral part of a complete examination. Limited examinations for reoccurring indications may be performed as noted. The reflux portion of the exam is performed with the patient in reverse Trendelenburg.  +---------+---------------+---------+-----------+----------+--------------+ RIGHT    CompressibilityPhasicitySpontaneityPropertiesThrombus Aging +---------+---------------+---------+-----------+----------+--------------+ CFV      Full           Yes      Yes                                 +---------+---------------+---------+-----------+----------+--------------+ SFJ      Full                                                        +---------+---------------+---------+-----------+----------+--------------+ FV Prox  Full                                                        +---------+---------------+---------+-----------+----------+--------------+  FV Mid   Full                                                        +---------+---------------+---------+-----------+----------+--------------+ FV DistalFull                                                        +---------+---------------+---------+-----------+----------+--------------+ PFV      Full                                                        +---------+---------------+---------+-----------+----------+--------------+ POP      Full           Yes      Yes                                  +---------+---------------+---------+-----------+----------+--------------+ PTV      Full                                                        +---------+---------------+---------+-----------+----------+--------------+ PERO     Full                                                        +---------+---------------+---------+-----------+----------+--------------+   +---------+---------------+---------+-----------+----------+--------------+ LEFT     CompressibilityPhasicitySpontaneityPropertiesThrombus Aging +---------+---------------+---------+-----------+----------+--------------+ CFV      Full           Yes      Yes                                 +---------+---------------+---------+-----------+----------+--------------+ SFJ      Full                                                        +---------+---------------+---------+-----------+----------+--------------+ FV Prox  Full                                                        +---------+---------------+---------+-----------+----------+--------------+ FV Mid   Full                                                        +---------+---------------+---------+-----------+----------+--------------+  FV DistalFull                                                        +---------+---------------+---------+-----------+----------+--------------+ PFV      Full                                                        +---------+---------------+---------+-----------+----------+--------------+ POP      Full           Yes      Yes                                 +---------+---------------+---------+-----------+----------+--------------+ PTV      Full                                                        +---------+---------------+---------+-----------+----------+--------------+ PERO     Full                                                         +---------+---------------+---------+-----------+----------+--------------+     Summary: RIGHT: - There is no evidence of deep vein thrombosis in the lower extremity. However, portions of this examination were limited- see technologist comments above.  - No cystic structure found in the popliteal fossa.  LEFT: - There is no evidence of deep vein thrombosis in the lower extremity. However, portions of this examination were limited- see technologist comments above.  - No cystic structure found in the popliteal fossa.  *See table(s) above for measurements and observations. Electronically signed by Orlie Pollen on 10/19/2021 at 5:03:34 PM.    Final     Assessment/Plan: Continue to mobilize with physical therapy awaiting placement patient to be discharged bed becomes available.  LOS: 18 days     Elaina Hoops 10/20/2021, 9:58 AM

## 2021-10-20 NOTE — TOC Transition Note (Signed)
Transition of Care (TOC) - CM/SW Discharge Note Marvetta Gibbons RN,BSN Transitions of Care Unit 4NP (Non Trauma)- RN Case Manager See Treatment Team for direct Phone #    Patient Details  Name: Bryan [name] MRN: [id number] Date of Birth: 22-May-1946  Transition of Care [facility]) CM/SW Contact:  Dawayne Patricia, RN Phone Number: 10/20/2021, 12:21 PM   Clinical Narrative:    Pt stable for transition home today, pt has decided he would prefer to return home w/ [facility] and not go to SNF for rehab. PT has seen pt and agrees with plan- recommendations have been updated to [facility] follow up. Orders placed for HHPT/OT, DME-RW and 3n1.  CM to room to speak with pt at bedside for transition needs. Discussed going home with [facility], wife will be available to assist and will provide transport home per pt. List provided for [facility] choice- Per CMS guidelines from medicare.gov website with star ratings (copy placed in shadow chart) - pt has selected [facility] as first choice and would like to use his [facility] Medicare for his services.  DME reviewed and pt confirmed he needs both RW and 3n1 for home. He is agreeable to use in house provider  Address, phone # and PCP all confirmed in epic.   Call made to [employer] for DME needs- RW and 3n1 to be delivered to room prior to discharge.  Call made to [facility] with Alvis Lemmings for [facility] referral- referral has been accepted for HHPT/OT  Wife to transport home once DME has been delivered to room  Final next level of care: [place] Barriers to Discharge: Barriers Resolved   Patient Goals and CMS Choice Patient states their goals for this hospitalization and ongoing recovery are:: return home CMS Medicare.gov Compare Post Acute Care list provided to:: Patient Choice offered to / list presented to : Patient  Discharge Placement                 Home w/ [facility]      Discharge Plan and Services In-house Referral: Clinical Social Work Discharge Planning Services: CM  Consult Post Acute Care Choice: Home Health, Durable Medical Equipment          DME Arranged: 3-N-1, Walker rolling DME Agency: [employer] Date DME Agency Contacted: 10/20/21 Time DME Agency Contacted: 77 Representative spoke with at DME Agency: Freda Munro HH Arranged: PT, OT [place] Agency: [place] Date [place]: 10/20/21 Time [place]: 1217 Representative spoke with at [place]: [place] ([place]) Interventions     Readmission Risk Interventions Readmission Risk Prevention Plan 10/20/2021  Transportation Screening Complete  PCP or Specialist Appt within 5-7 Days Complete  Home Care Screening Complete  Medication Review (RN CM) Complete  Some recent data might be hidden

## 2021-10-20 NOTE — Progress Notes (Signed)
Physical Therapy Treatment Patient Details Name: Bryan [name] MRN: [id number] DOB: 1946/01/05 Today's Date: 10/20/2021   History of Present Illness 75 yo male s/p L2-4 decompression and fusion, exploration of L4-S1 fusion, redo L5-S1 fusion on 10/03/21. Hospital course was complicated by A. fib with RVR (cardiology consulted and signed off now), confusion and fever with anemia (pt [employer] and declines blood products). PMH includes OA, BPH, cancer, DM, obesity, RA, seizures, ACDF 2014, lap chole, lumbar surgeries 2016-2017.    PT Comments    Pt making good progress.  Only needing min guard today for transfers.  Pt was able to provide teach back of precautions, log roll, and how to don/doff brace.  Pt reports wife is able to provide min guard and ligth min A if needed.  With good progress and wife able to assist - recommend updating d/c plan to home withHHPT. Pt agreeable and wants to go home.     Recommendations for follow up therapy are one component of a multi-disciplinary discharge planning process, led by the attending physician.  Recommendations may be updated based on patient status, additional functional criteria and insurance authorization.  Follow Up Recommendations  Home health PT     Assistance Recommended at Discharge Frequent or constant Supervision/Assistance  Equipment Recommendations  Rolling walker (2 wheels);BSC/3in1    Recommendations for Other Services       Precautions / Restrictions Precautions Precautions: Fall;Back Precaution Booklet Issued: Yes (comment) (back handout) Required Braces or Orthoses: Spinal Brace Spinal Brace: Lumbar corset;Applied in sitting position     Mobility  Bed Mobility Overal bed mobility: Needs Assistance Bed Mobility: Rolling;Sidelying to Sit;Sit to Supine Rolling: Supervision Sidelying to sit: Supervision;HOB elevated     Sit to sidelying: Supervision;HOB elevated General bed mobility comments: HOB slightly elevated  (pt has elevating bed at home); demonstrated good log roll technique    Transfers Overall transfer level: Needs assistance Equipment used: Rolling walker (2 wheels) Transfers: Sit to/from Stand Sit to Stand: Min guard           General transfer comment: min guard for safety; good hand placement from chair but cued for hand placement from bed    Ambulation/Gait Ambulation/Gait assistance: Min guard Gait Distance (Feet): 140 Feet Assistive device: Rolling walker (2 wheels) Gait Pattern/deviations: Step-through pattern;Trunk flexed Gait velocity: decreased     General Gait Details: Min cues for upright posture and to stay inside RW; improved foot clearance   Stairs Stairs:  (only threshold to enter home)           Wheelchair Mobility    Modified Rankin (Stroke Patients Only)       Balance Overall balance assessment: Needs assistance Sitting-balance support: Feet supported;No upper extremity supported Sitting balance-Leahy Scale: Good     Standing balance support: Bilateral upper extremity supported;No upper extremity supported Standing balance-Leahy Scale: Fair Standing balance comment: RW to ambulate but did don mask in standing without UE support                            Cognition Arousal/Alertness: Awake/alert Behavior During Therapy: WFL for tasks assessed/performed Overall Cognitive Status: Within Functional Limits for tasks assessed                                 General Comments: Alert and oriented; recalling all precautions; provided teach back and demonstrated log rolls and don/doffing  brace with min A to place brace on back (pt directed therapist on how to place)        Exercises      General Comments General comments (skin integrity, edema, etc.): VSS      Pertinent Vitals/Pain Pain Assessment: 0-10 Pain Score: 2  Pain Location: back ; with walking; no pain rest Pain Descriptors / Indicators: Sore Pain  Intervention(s): Limited activity within patient's tolerance;Monitored during session    Home Living                          Prior Function            PT Goals (current goals can now be found in the care plan section) Progress towards PT goals: Progressing toward goals    Frequency    Min 5X/week      PT Plan Discharge plan needs to be updated    Co-evaluation              AM-PAC PT "6 Clicks" Mobility   Outcome Measure  Help needed turning from your back to your side while in a flat bed without using bedrails?: None Help needed moving from lying on your back to sitting on the side of a flat bed without using bedrails?: A Little Help needed moving to and from a bed to a chair (including a wheelchair)?: A Little Help needed standing up from a chair using your arms (e.g., wheelchair or bedside chair)?: A Little Help needed to walk in hospital room?: A Little Help needed climbing 3-5 steps with a railing? : A Little 6 Click Score: 19    End of Session Equipment Utilized During Treatment: Back brace Activity Tolerance: Patient tolerated treatment well Patient left: in chair;with call bell/phone within reach;with chair alarm set Nurse Communication: Mobility status PT Visit Diagnosis: Other abnormalities of gait and mobility (R26.89);Muscle weakness (generalized) (M62.81)     Time: 2956-2130 PT Time Calculation (min) (ACUTE ONLY): 24 min  Charges:  $Gait Training: 8-22 mins $Therapeutic Activity: 8-22 mins                     Abran Richard, PT Acute Rehab Services Pager [phone] [facility] [place] 10/20/2021, 10:58 AM

## 2021-10-20 NOTE — Progress Notes (Signed)
Chart reviewed.  Both magnesium and potassium are normal today.  Appears hemodynamically stable.  All medical issues that hospitalist were managing are stable.  Nothing much for [place] to actively manage.  Patient pending placement.  Hospitalist will sign off.  Please reconsult if our services are needed.  I would recommend checking patient's potassium and magnesium frequently and replace as needed.

## 2021-10-20 NOTE — Discharge Summary (Signed)
Physician Discharge Summary  Patient ID: Bryan [name] MRN: [id number] DOB/AGE: 1946-05-30 75 y.o.  Admit date: 10/02/2021 Discharge date: 10/20/2021  Admission Diagnoses: Degenerative disc disease degenerative scoliosis lumbar spinal stenosis L2-3 L3-4 with herniated nuclear process L2-3    Discharge Diagnoses: same   Discharged Condition: good  Hospital Course: The patient was admitted on 10/02/2021 and taken to the operating room where the patient underwent PLIF L2-3, L3-4. The patient tolerated the procedure well and was taken to the recovery room and then to the floor in stable condition. The hospital course was routine. There were no complications. The wound remained clean dry and intact. Pt had appropriate back soreness. No complaints of leg pain or new N/T/W. The patient remained afebrile with stable vital signs, and tolerated a regular diet. The patient continued to increase activities, and pain was well controlled with oral pain medications.   Consults: cardiology and internal medicine  Significant Diagnostic Studies:  Results for orders placed or performed during the hospital encounter of 10/02/21  Urine Culture   Specimen: Urine, Catheterized  Result Value Ref Range   Specimen Description URINE, CATHETERIZED    Special Requests NONE    Culture      NO GROWTH Performed at [facility], [address] [place], [place] [address]    Report Status 10/09/2021 FINAL   Culture, blood (routine x 2)   Specimen: BLOOD  Result Value Ref Range   Specimen Description BLOOD LEFT ANTECUBITAL    Special Requests AEROBIC BOTTLE ONLY Blood Culture adequate volume    Culture      NO GROWTH 5 DAYS Performed at [facility], [place] [address], [employer] [place], [place] [address]    Report Status 10/14/2021 FINAL   Culture, blood (routine x 2)   Specimen: BLOOD  Result Value Ref Range   Specimen Description BLOOD SITE NOT SPECIFIED    Special Requests AEROBIC BOTTLE ONLY Blood  Culture adequate volume    Culture      NO GROWTH 5 DAYS Performed at [facility], [place] [address] [place], [place] [address]    Report Status 10/14/2021 FINAL   Urine Culture   Specimen: Urine, Catheterized  Result Value Ref Range   Specimen Description URINE, CATHETERIZED    Special Requests NONE    Culture      NO GROWTH Performed at [facility], [place] [address] [place],  [address]    Report Status 10/10/2021 FINAL   C Difficile Quick Screen (NO PCR Reflex)   Specimen: STOOL  Result Value Ref Range   C Diff antigen NEGATIVE NEGATIVE   C Diff toxin NEGATIVE NEGATIVE   C Diff interpretation No C. difficile detected.   Resp Panel by RT-PCR (Flu A&B, Covid) Nasopharyngeal Swab   Specimen: Nasopharyngeal Swab; Nasopharyngeal(NP) swabs in vial transport medium  Result Value Ref Range   SARS Coronavirus 2 by RT PCR NEGATIVE NEGATIVE   Influenza A by PCR NEGATIVE NEGATIVE   Influenza B by PCR NEGATIVE NEGATIVE  Glucose, capillary  Result Value Ref Range   Glucose-Capillary 74 70 - 99 mg/dL  Glucose, capillary  Result Value Ref Range   Glucose-Capillary 134 (H) 70 - 99 mg/dL  Glucose, capillary  Result Value Ref Range   Glucose-Capillary 107 (H) 70 - 99 mg/dL  CBC with Differential/Platelet  Result Value Ref Range   WBC 10.3 4.0 - 10.5 K/uL   RBC 2.78 (L) 4.22 - 5.81 MIL/uL   Hemoglobin 8.8 (  L) 13.0 - 17.0 g/dL   HCT 27.0 (L) 39.0 - 52.0 %   MCV 97.1 80.0 - 100.0 fL   MCH 31.7 26.0 - 34.0 pg   MCHC 32.6 30.0 - 36.0 g/dL   RDW 13.8 11.5 - 15.5 %   Platelets 187 150 - 400 K/uL   nRBC 0.0 0.0 - 0.2 %   Neutrophils Relative % 78 %   Neutro Abs 8.1 (H) 1.7 - 7.7 K/uL   Lymphocytes Relative 9 %   Lymphs Abs 0.9 0.7 - 4.0 K/uL   Monocytes Relative 12 %   Monocytes Absolute 1.2 (H) 0.1 - 1.0 K/uL   Eosinophils Relative 0 %   Eosinophils Absolute 0.0 0.0 - 0.5 K/uL   Basophils Relative 0 %   Basophils Absolute 0.0 0.0 - 0.1 K/uL   Immature Granulocytes  1 %   Abs Immature Granulocytes 0.05 0.00 - 0.07 K/uL  Basic metabolic panel  Result Value Ref Range   Sodium 135 135 - 145 mmol/L   Potassium 4.0 3.5 - 5.1 mmol/L   Chloride 99 98 - 111 mmol/L   CO2 27 22 - 32 mmol/L   Glucose, Bld 139 (H) 70 - 99 mg/dL   BUN 36 (H) 8 - 23 mg/dL   Creatinine, Ser 2.72 (H) 0.61 - 1.24 mg/dL   Calcium 7.6 (L) 8.9 - 10.3 mg/dL   GFR, Estimated 23 (L) >60 mL/min   Anion gap 9 5 - 15  Urinalysis, Routine w reflex microscopic Urine, Clean Catch  Result Value Ref Range   Color, Urine YELLOW YELLOW   APPearance CLEAR CLEAR   Specific Gravity, Urine 1.011 1.005 - 1.030   pH 5.0 5.0 - 8.0   Glucose, UA NEGATIVE NEGATIVE mg/dL   Hgb urine dipstick MODERATE (A) NEGATIVE   Bilirubin Urine NEGATIVE NEGATIVE   Ketones, ur NEGATIVE NEGATIVE mg/dL   Protein, ur NEGATIVE NEGATIVE mg/dL   Nitrite NEGATIVE NEGATIVE   Leukocytes,Ua TRACE (A) NEGATIVE   RBC / HPF 6-10 0 - 5 RBC/hpf   WBC, UA 0-5 0 - 5 WBC/hpf   Bacteria, UA RARE (A) NONE SEEN   Mucus PRESENT   CBC  Result Value Ref Range   WBC 11.1 (H) 4.0 - 10.5 K/uL   RBC 2.33 (L) 4.22 - 5.81 MIL/uL   Hemoglobin 7.3 (L) 13.0 - 17.0 g/dL   HCT 22.8 (L) 39.0 - 52.0 %   MCV 97.9 80.0 - 100.0 fL   MCH 31.3 26.0 - 34.0 pg   MCHC 32.0 30.0 - 36.0 g/dL   RDW 13.6 11.5 - 15.5 %   Platelets 202 150 - 400 K/uL   nRBC 0.0 0.0 - 0.2 %  Lactic acid, plasma  Result Value Ref Range   Lactic Acid, Venous 1.6 0.5 - 1.9 mmol/L  Lactic acid, plasma  Result Value Ref Range   Lactic Acid, Venous 1.2 0.5 - 1.9 mmol/L  Glucose, capillary  Result Value Ref Range   Glucose-Capillary 114 (H) 70 - 99 mg/dL  CBC with Differential/Platelet  Result Value Ref Range   WBC 8.3 4.0 - 10.5 K/uL   RBC 1.93 (L) 4.22 - 5.81 MIL/uL   Hemoglobin 6.1 (LL) 13.0 - 17.0 g/dL   HCT 18.2 (L) 39.0 - 52.0 %   MCV 94.3 80.0 - 100.0 fL   MCH 31.6 26.0 - 34.0 pg   MCHC 33.5 30.0 - 36.0 g/dL   RDW 13.3 11.5 - 15.5 %   Platelets 262 150 - 400  K/uL   nRBC 0.4 (H) 0.0 - 0.2 %   Neutrophils Relative % 77 %   Neutro Abs 6.3 1.7 - 7.7 K/uL   Lymphocytes Relative 12 %   Lymphs Abs 1.0 0.7 - 4.0 K/uL   Monocytes Relative 9 %   Monocytes Absolute 0.8 0.1 - 1.0 K/uL   Eosinophils Relative 2 %   Eosinophils Absolute 0.2 0.0 - 0.5 K/uL   Basophils Relative 0 %   Basophils Absolute 0.0 0.0 - 0.1 K/uL   Immature Granulocytes 0 %   Abs Immature Granulocytes 0.03 0.00 - 0.07 K/uL  Comprehensive metabolic panel  Result Value Ref Range   Sodium 133 (L) 135 - 145 mmol/L   Potassium 3.9 3.5 - 5.1 mmol/L   Chloride 95 (L) 98 - 111 mmol/L   CO2 30 22 - 32 mmol/L   Glucose, Bld 132 (H) 70 - 99 mg/dL   BUN 47 (H) 8 - 23 mg/dL   Creatinine, Ser 2.31 (H) 0.61 - 1.24 mg/dL   Calcium 8.0 (L) 8.9 - 10.3 mg/dL   Total Protein 6.0 (L) 6.5 - 8.1 g/dL   Albumin 2.0 (L) 3.5 - 5.0 g/dL   AST 88 (H) 15 - 41 U/L   ALT 47 (H) 0 - 44 U/L   Alkaline Phosphatase 179 (H) 38 - 126 U/L   Total Bilirubin 0.6 0.3 - 1.2 mg/dL   GFR, Estimated 29 (L) >60 mL/min   Anion gap 8 5 - 15  Lactic acid, plasma  Result Value Ref Range   Lactic Acid, Venous 1.0 0.5 - 1.9 mmol/L  Lactic acid, plasma  Result Value Ref Range   Lactic Acid, Venous 0.9 0.5 - 1.9 mmol/L  Urinalysis, Routine w reflex microscopic Urine, Catheterized  Result Value Ref Range   Color, Urine YELLOW YELLOW   APPearance HAZY (A) CLEAR   Specific Gravity, Urine 1.011 1.005 - 1.030   pH 5.0 5.0 - 8.0   Glucose, UA NEGATIVE NEGATIVE mg/dL   Hgb urine dipstick MODERATE (A) NEGATIVE   Bilirubin Urine NEGATIVE NEGATIVE   Ketones, ur NEGATIVE NEGATIVE mg/dL   Protein, ur NEGATIVE NEGATIVE mg/dL   Nitrite NEGATIVE NEGATIVE   Leukocytes,Ua NEGATIVE NEGATIVE   RBC / HPF 0-5 0 - 5 RBC/hpf   WBC, UA 0-5 0 - 5 WBC/hpf   Bacteria, UA RARE (A) NONE SEEN   Mucus PRESENT    Ca Oxalate Crys, UA PRESENT   Basic metabolic panel  Result Value Ref Range   Sodium 134 (L) 135 - 145 mmol/L   Potassium 3.7  3.5 - 5.1 mmol/L   Chloride 98 98 - 111 mmol/L   CO2 27 22 - 32 mmol/L   Glucose, Bld 122 (H) 70 - 99 mg/dL   BUN 45 (H) 8 - 23 mg/dL   Creatinine, Ser 2.08 (H) 0.61 - 1.24 mg/dL   Calcium 8.0 (L) 8.9 - 10.3 mg/dL   GFR, Estimated 32 (L) >60 mL/min   Anion gap 9 5 - 15  CBC  Result Value Ref Range   WBC 8.1 4.0 - 10.5 K/uL   RBC 1.93 (L) 4.22 - 5.81 MIL/uL   Hemoglobin 6.1 (LL) 13.0 - 17.0 g/dL   HCT 18.4 (L) 39.0 - 52.0 %   MCV 95.3 80.0 - 100.0 fL   MCH 31.6 26.0 - 34.0 pg   MCHC 33.2 30.0 - 36.0 g/dL   RDW 13.4 11.5 - 15.5 %   Platelets 271 150 - 400 K/uL  nRBC 0.2 0.0 - 0.2 %  Vitamin B12  Result Value Ref Range   Vitamin B-12 175 (L) 180 - 914 pg/mL  Folate  Result Value Ref Range   Folate 10.0 >5.9 ng/mL  Iron and TIBC  Result Value Ref Range   Iron 21 (L) 45 - 182 ug/dL   TIBC 203 (L) 250 - 450 ug/dL   Saturation Ratios 10 (L) 17.9 - 39.5 %   UIBC 182 ug/dL  Ferritin  Result Value Ref Range   Ferritin 167 24 - 336 ng/mL  Reticulocytes  Result Value Ref Range   Retic Ct Pct 1.3 0.4 - 3.1 %   RBC. 1.96 (L) 4.22 - 5.81 MIL/uL   Retic Count, Absolute 24.5 19.0 - 186.0 K/uL   Immature Retic Fract 18.8 (H) 2.3 - 15.9 %  CBC  Result Value Ref Range   WBC 8.0 4.0 - 10.5 K/uL   RBC 1.94 (L) 4.22 - 5.81 MIL/uL   Hemoglobin 6.1 (LL) 13.0 - 17.0 g/dL   HCT 18.7 (L) 39.0 - 52.0 %   MCV 96.4 80.0 - 100.0 fL   MCH 31.4 26.0 - 34.0 pg   MCHC 32.6 30.0 - 36.0 g/dL   RDW 13.6 11.5 - 15.5 %   Platelets 323 150 - 400 K/uL   nRBC 0.2 0.0 - 0.2 %  Comprehensive metabolic panel  Result Value Ref Range   Sodium 134 (L) 135 - 145 mmol/L   Potassium 3.4 (L) 3.5 - 5.1 mmol/L   Chloride 100 98 - 111 mmol/L   CO2 27 22 - 32 mmol/L   Glucose, Bld 119 (H) 70 - 99 mg/dL   BUN 42 (H) 8 - 23 mg/dL   Creatinine, Ser 2.09 (H) 0.61 - 1.24 mg/dL   Calcium 8.3 (L) 8.9 - 10.3 mg/dL   Total Protein 5.6 (L) 6.5 - 8.1 g/dL   Albumin 1.9 (L) 3.5 - 5.0 g/dL   AST 47 (H) 15 - 41 U/L    ALT 36 0 - 44 U/L   Alkaline Phosphatase 181 (H) 38 - 126 U/L   Total Bilirubin 0.5 0.3 - 1.2 mg/dL   GFR, Estimated 32 (L) >60 mL/min   Anion gap 7 5 - 15  CBC  Result Value Ref Range   WBC 10.0 4.0 - 10.5 K/uL   RBC 1.99 (L) 4.22 - 5.81 MIL/uL   Hemoglobin 6.1 (LL) 13.0 - 17.0 g/dL   HCT 19.3 (L) 39.0 - 52.0 %   MCV 97.0 80.0 - 100.0 fL   MCH 30.7 26.0 - 34.0 pg   MCHC 31.6 30.0 - 36.0 g/dL   RDW 13.7 11.5 - 15.5 %   Platelets 377 150 - 400 K/uL   nRBC 0.2 0.0 - 0.2 %  Basic metabolic panel  Result Value Ref Range   Sodium 137 135 - 145 mmol/L   Potassium 3.7 3.5 - 5.1 mmol/L   Chloride 104 98 - 111 mmol/L   CO2 24 22 - 32 mmol/L   Glucose, Bld 141 (H) 70 - 99 mg/dL   BUN 38 (H) 8 - 23 mg/dL   Creatinine, Ser 1.68 (H) 0.61 - 1.24 mg/dL   Calcium 8.3 (L) 8.9 - 10.3 mg/dL   GFR, Estimated 42 (L) >60 mL/min   Anion gap 9 5 - 15  Basic metabolic panel  Result Value Ref Range   Sodium 140 135 - 145 mmol/L   Potassium 3.4 (L) 3.5 - 5.1 mmol/L   Chloride 106 98 -  111 mmol/L   CO2 25 22 - 32 mmol/L   Glucose, Bld 105 (H) 70 - 99 mg/dL   BUN 34 (H) 8 - 23 mg/dL   Creatinine, Ser 1.69 (H) 0.61 - 1.24 mg/dL   Calcium 8.2 (L) 8.9 - 10.3 mg/dL   GFR, Estimated 42 (L) >60 mL/min   Anion gap 9 5 - 15  CBC  Result Value Ref Range   WBC 10.0 4.0 - 10.5 K/uL   RBC 2.00 (L) 4.22 - 5.81 MIL/uL   Hemoglobin 6.2 (LL) 13.0 - 17.0 g/dL   HCT 19.0 (L) 39.0 - 52.0 %   MCV 95.0 80.0 - 100.0 fL   MCH 31.0 26.0 - 34.0 pg   MCHC 32.6 30.0 - 36.0 g/dL   RDW 13.8 11.5 - 15.5 %   Platelets 346 150 - 400 K/uL   nRBC 0.5 (H) 0.0 - 0.2 %  CBC  Result Value Ref Range   WBC 10.4 4.0 - 10.5 K/uL   RBC 2.12 (L) 4.22 - 5.81 MIL/uL   Hemoglobin 6.7 (LL) 13.0 - 17.0 g/dL   HCT 21.1 (L) 39.0 - 52.0 %   MCV 99.5 80.0 - 100.0 fL   MCH 31.6 26.0 - 34.0 pg   MCHC 31.8 30.0 - 36.0 g/dL   RDW 14.6 11.5 - 15.5 %   Platelets 410 (H) 150 - 400 K/uL   nRBC 0.5 (H) 0.0 - 0.2 %  Basic metabolic panel   Result Value Ref Range   Sodium 138 135 - 145 mmol/L   Potassium 3.2 (L) 3.5 - 5.1 mmol/L   Chloride 105 98 - 111 mmol/L   CO2 25 22 - 32 mmol/L   Glucose, Bld 132 (H) 70 - 99 mg/dL   BUN 32 (H) 8 - 23 mg/dL   Creatinine, Ser 1.48 (H) 0.61 - 1.24 mg/dL   Calcium 8.1 (L) 8.9 - 10.3 mg/dL   GFR, Estimated 49 (L) >60 mL/min   Anion gap 8 5 - 15  Comprehensive metabolic panel  Result Value Ref Range   Sodium 138 135 - 145 mmol/L   Potassium 2.9 (L) 3.5 - 5.1 mmol/L   Chloride 105 98 - 111 mmol/L   CO2 24 22 - 32 mmol/L   Glucose, Bld 102 (H) 70 - 99 mg/dL   BUN 23 8 - 23 mg/dL   Creatinine, Ser 1.40 (H) 0.61 - 1.24 mg/dL   Calcium 7.8 (L) 8.9 - 10.3 mg/dL   Total Protein 5.2 (L) 6.5 - 8.1 g/dL   Albumin 2.0 (L) 3.5 - 5.0 g/dL   AST 29 15 - 41 U/L   ALT 23 0 - 44 U/L   Alkaline Phosphatase 120 38 - 126 U/L   Total Bilirubin 0.3 0.3 - 1.2 mg/dL   GFR, Estimated 52 (L) >60 mL/min   Anion gap 9 5 - 15  CBC  Result Value Ref Range   WBC 8.6 4.0 - 10.5 K/uL   RBC 2.01 (L) 4.22 - 5.81 MIL/uL   Hemoglobin 6.3 (LL) 13.0 - 17.0 g/dL   HCT 19.9 (L) 39.0 - 52.0 %   MCV 99.0 80.0 - 100.0 fL   MCH 31.3 26.0 - 34.0 pg   MCHC 31.7 30.0 - 36.0 g/dL   RDW 15.2 11.5 - 15.5 %   Platelets 360 150 - 400 K/uL   nRBC 0.2 0.0 - 0.2 %  Magnesium  Result Value Ref Range   Magnesium 1.3 (L) 1.7 - 2.4 mg/dL  CBC  Result Value Ref Range   WBC 7.8 4.0 - 10.5 K/uL   RBC 2.04 (L) 4.22 - 5.81 MIL/uL   Hemoglobin 6.5 (LL) 13.0 - 17.0 g/dL   HCT 20.5 (L) 39.0 - 52.0 %   MCV 100.5 (H) 80.0 - 100.0 fL   MCH 31.9 26.0 - 34.0 pg   MCHC 31.7 30.0 - 36.0 g/dL   RDW 16.1 (H) 11.5 - 15.5 %   Platelets 343 150 - 400 K/uL   nRBC 0.3 (H) 0.0 - 0.2 %  Basic metabolic panel  Result Value Ref Range   Sodium 138 135 - 145 mmol/L   Potassium 3.3 (L) 3.5 - 5.1 mmol/L   Chloride 106 98 - 111 mmol/L   CO2 24 22 - 32 mmol/L   Glucose, Bld 92 70 - 99 mg/dL   BUN 24 (H) 8 - 23 mg/dL   Creatinine, Ser 1.42 (H)  0.61 - 1.24 mg/dL   Calcium 7.8 (L) 8.9 - 10.3 mg/dL   GFR, Estimated 51 (L) >60 mL/min   Anion gap 8 5 - 15  Magnesium  Result Value Ref Range   Magnesium 1.9 1.7 - 2.4 mg/dL  Basic metabolic panel  Result Value Ref Range   Sodium 140 135 - 145 mmol/L   Potassium 3.4 (L) 3.5 - 5.1 mmol/L   Chloride 107 98 - 111 mmol/L   CO2 23 22 - 32 mmol/L   Glucose, Bld 112 (H) 70 - 99 mg/dL   BUN 27 (H) 8 - 23 mg/dL   Creatinine, Ser 1.52 (H) 0.61 - 1.24 mg/dL   Calcium 7.8 (L) 8.9 - 10.3 mg/dL   GFR, Estimated 47 (L) >60 mL/min   Anion gap 10 5 - 15  Magnesium  Result Value Ref Range   Magnesium 1.6 (L) 1.7 - 2.4 mg/dL  Basic metabolic panel  Result Value Ref Range   Sodium 135 135 - 145 mmol/L   Potassium 3.3 (L) 3.5 - 5.1 mmol/L   Chloride 106 98 - 111 mmol/L   CO2 21 (L) 22 - 32 mmol/L   Glucose, Bld 98 70 - 99 mg/dL   BUN 20 8 - 23 mg/dL   Creatinine, Ser 1.21 0.61 - 1.24 mg/dL   Calcium 7.8 (L) 8.9 - 10.3 mg/dL   GFR, Estimated >60 >60 mL/min   Anion gap 8 5 - 15  Magnesium  Result Value Ref Range   Magnesium 1.6 (L) 1.7 - 2.4 mg/dL  Basic metabolic panel  Result Value Ref Range   Sodium 138 135 - 145 mmol/L   Potassium 3.8 3.5 - 5.1 mmol/L   Chloride 106 98 - 111 mmol/L   CO2 23 22 - 32 mmol/L   Glucose, Bld 93 70 - 99 mg/dL   BUN 19 8 - 23 mg/dL   Creatinine, Ser 1.22 0.61 - 1.24 mg/dL   Calcium 7.8 (L) 8.9 - 10.3 mg/dL   GFR, Estimated >60 >60 mL/min   Anion gap 9 5 - 15  Magnesium  Result Value Ref Range   Magnesium 1.8 1.7 - 2.4 mg/dL  ECHOCARDIOGRAM COMPLETE  Result Value Ref Range   Weight 3,584 oz   Height 65.5 in   BP 122/71 mmHg   S' Lateral 3.20 cm   AV Vena cont 0.30 cm    DG Chest 1 View  Result Date: 10/09/2021 CLINICAL DATA:  Fever today, history diabetes mellitus, hypertension, past history lymphoma EXAM: CHEST  1 VIEW COMPARISON:  Portable exam 0830 hours compared to  10/07/2021 and 02/22/2020. FINDINGS: Normal heart size and pulmonary  vascularity. Enlargement of hila especially of LEFT hilum increased since 02/22/2020 Lungs clear. No definite infiltrate, pleural effusion, or pneumothorax. Prior cervical fusion. IMPRESSION: Enlargement of pulmonary hila especially LEFT hilum, cannot exclude adenopathy; CT chest with contrast recommended for further assessment. Electronically Signed   By: Lavonia Dana M.D.   On: 10/09/2021 08:41   DG Lumbar Spine 2-3 Views  Result Date: 10/02/2021 CLINICAL DATA:  Lumbar fusion. EXAM: LUMBAR SPINE - 2-3 VIEW COMPARISON:  Preoperative CT 09/30/2021 FINDINGS: Two fluoroscopic spot views of the lumbar spine obtained in the operating room in frontal and lateral projection. Pedicle rods have been removed. New pedicle screws at L2 and L3 with interbody spacers. L4, L5, and S1 pedicle screws are seen. Interbody spacers in place. Fluoroscopy time 59 seconds. Dose 52.88 mGy. IMPRESSION: Intraoperative fluoroscopy for lumbar surgery. Electronically Signed   By: Keith Rake M.D.   On: 10/02/2021 18:55   CT CHEST WO CONTRAST  Result Date: 10/09/2021 CLINICAL DATA:  Sepsis EXAM: CT CHEST WITHOUT CONTRAST TECHNIQUE: Multidetector CT imaging of the chest was performed following the standard protocol without IV contrast. COMPARISON:  Chest x-ray 10/09/2021, CT chest 02/22/2020 FINDINGS: Cardiovascular: Limited evaluation without intravenous contrast. Mild atherosclerosis. No aneurysm. Common origin of right brachiocephalic and left common carotid artery. Cardiac size within normal limits. No significant pericardial effusion. Mediastinum/Nodes: Midline trachea. No thyroid mass. No suspicious adenopathy. Note that assessment for hilar nodes is limited without contrast. Esophagus within normal limits Lungs/Pleura: Trace left-sided pleural effusion. Mild bronchiectasis and distortion in the posterior right upper lobe with surrounding ground-glass density consistent with scarring. No acute airspace disease or pneumothorax.  Mild right lower lobe bronchiectasis as well with scarring. Upper Abdomen: No acute abnormality. Musculoskeletal: Chronic fracture deformity of the upper sternal body. No acute osseous abnormality IMPRESSION: 1. Negative for acute airspace disease. Areas of chronic scarring in the right upper and lower lobes. 2. Small left-sided pleural effusion. Aortic Atherosclerosis (ICD10-I70.0). Electronically Signed   By: Donavan Foil M.D.   On: 10/09/2021 22:56   CT LUMBAR SPINE WO CONTRAST  Result Date: 09/30/2021 CLINICAL DATA:  75 year old male with low back pain. Prior surgery. Evidence of S1 screw loosening last year. EXAM: CT LUMBAR SPINE WITHOUT CONTRAST TECHNIQUE: Multidetector CT imaging of the lumbar spine was performed without intravenous contrast administration. Multiplanar CT image reconstructions were also generated. COMPARISON:  CT lumbar spine 09/07/2020. FINDINGS: Segmentation: Normal, same numbering system used last year. Alignment: Grade 1 anterolisthesis of L4 on L5 and straightening of lumbar lordosis elsewhere is stable from last year. There is subtle mild dextroconvex lumbar spine curvature. Chronic retrolisthesis at both L2-L3 and L3-L4. Vertebrae: Postoperative details are below. Chronic interbody ankylosis related to flowing anterior endplate osteophytes at T11-T12 and L1-L2. No acute osseous abnormality identified. Intact visible sacrum and SI joints. Paraspinal and other soft tissues: Mild Aortoiliac calcified atherosclerosis. Stable, negative visible noncontrast abdominal viscera. Stable postoperative changes to the posterior lumbar paraspinal soft tissues. Disc levels: Visible lower thoracic levels through L1-L2: Stable and negative. L2-L3: Chronic severe disc space loss. Less vacuum disc compared to last year. Bulky circumferential disc osteophyte complex appears mildly progressed along with posterior element hypertrophy. New or increased spinal stenosis and left lateral recess stenosis,  mild-to-moderate. Stable moderate left and mild right L2 foraminal stenosis. L3-L4: Chronic severe disc space loss and mild retrolisthesis. Less vacuum disc compared to last year. Bulky circumferential disc osteophyte complex and moderate to severe  facet and ligament flavum hypertrophy both appear mildly progressed since last year. Up to moderate associated spinal stenosis appears increased on series 3, image 79. Moderate to severe left L3 foraminal stenosis is stable. L4-L5: Prior decompression and fusion. Hardware appears stable aside from increased conspicuity of lucency along the left L5 pedicle screw (series 6, image 92). Mild endplate subsidence appears stable. Scant if any arthrodesis at this level. L5-S1: Prior decompression and fusion. Chronic lucency along both S1 pedicle screws. No interbody implant. Continued interbody vacuum phenomena with underlying severe disc space loss and bulky circumferential disc osteophyte complex. No convincing arthrodesis. Mild to moderate multifactorial L5 foraminal stenosis is stable and greater on the left. IMPRESSION: 1. Prior decompression and fusion at L4-L5 and L5-S1 with chronic loosening of both S1 pedicle screws, and early loosening also of the left L5 pedicle screw. Scant if any arthrodesis at L4-L5, and no convincing arthrodesis at L5-S1. 2. Advanced degeneration at both L2-L3 and L3-L4 appears somewhat progressed from the CT last year, with moderate multifactorial spinal stenosis suspected at L3-L4. Neural foraminal stenosis at those levels appears stable. 3. 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 Electronically Signed   By: Genevie Ann M.D.   On: 09/30/2021 12:16   DG Chest Port 1 View  Result Date: 10/18/2021 CLINICAL DATA:  Shortness of breath EXAM: PORTABLE CHEST 1 VIEW COMPARISON:  10/09/2021 FINDINGS: Transverse diameter of heart is increased. There are no signs of pulmonary edema or focal pulmonary consolidation. There is poor inspiration.  Increased markings are seen in the lower lung fields, more so on the left side. There is blunting of lateral CP angles. There is no pneumothorax. There is previous surgical fusion in the lower cervical spine. IMPRESSION: Increased markings are seen in the lower lung fields, more so on the left side. This may be due to crowding of bronchovascular structures caused by poor inspiration or atelectasis/pneumonitis. Electronically Signed   By: Elmer Picker M.D.   On: 10/18/2021 12:52   DG Chest Port 1 View  Result Date: 10/07/2021 CLINICAL DATA:  Fever EXAM: PORTABLE CHEST 1 VIEW COMPARISON:  02/22/2020 and prior studies FINDINGS: UPPER limits normal heart size noted. Mild peribronchial thickening is unchanged. There is no evidence of focal airspace disease, pulmonary edema, suspicious pulmonary nodule/mass, pleural effusion, or pneumothorax. No acute bony abnormalities are identified. IMPRESSION: No active disease. Electronically Signed   By: Margarette Canada M.D.   On: 10/07/2021 11:27   DG C-Arm 1-60 Min-No Report  Result Date: 10/02/2021 Fluoroscopy was utilized by the requesting physician.  No radiographic interpretation.   DG C-Arm 1-60 Min-No Report  Result Date: 10/02/2021 Fluoroscopy was utilized by the requesting physician.  No radiographic interpretation.   DG C-Arm 1-60 Min-No Report  Result Date: 10/02/2021 Fluoroscopy was utilized by the requesting physician.  No radiographic interpretation.   DG C-Arm 1-60 Min-No Report  Result Date: 10/02/2021 Fluoroscopy was utilized by the requesting physician.  No radiographic interpretation.   ECHOCARDIOGRAM COMPLETE  Result Date: 10/11/2021    ECHOCARDIOGRAM REPORT   Patient Name:   Bryan [name] Date of Exam: 10/11/2021 Medical Rec #:  [id number]     Height:       65.5 in Accession #:    [id number]    Weight:       224.0 lb Date of Birth:  12-17-1945    BSA:          2.088 m Patient Age:    2 years  BP:           136/89 mmHg  Patient Gender: M             HR:           98 bpm. Exam Location:  Inpatient Procedure: 2D Echo, Color Doppler and Cardiac Doppler Indications:    I48.92* Unspecified atrial flutter  History:        Patient has prior history of Echocardiogram examinations, most                 recent 07/30/2018. CAD, Arrythmias:Atrial Fibrillation; Risk                 Factors:Hypertension and Sleep Apnea.  Sonographer:    Raquel Sarna Senior RDCS Referring Phys: [id number] CELESTE C [place]  Sonographer Comments: Technically difficult study due to poor echo windows. IMPRESSIONS  1. Left ventricular ejection fraction, by estimation, is 55 to 60%. The left ventricle has normal function. The left ventricle has no regional wall motion abnormalities. Left ventricular diastolic parameters are indeterminate. There is the interventricular septum is flattened in diastole ('D' shaped left ventricle), consistent with right ventricular volume overload.  2. Right ventricular systolic function is normal. The right ventricular size is normal. There is moderately elevated pulmonary artery systolic pressure.  3. The mitral valve is normal in structure. No evidence of mitral valve regurgitation. No evidence of mitral stenosis.  4. Tricuspid valve regurgitation is moderate.  5. The aortic valve is normal in structure. There is mild thickening of the aortic valve. Aortic valve regurgitation is mild. No aortic stenosis is present.  6. The inferior vena cava is normal in size with greater than 50% respiratory variability, suggesting right atrial pressure of 3 mmHg. Comparison(s): A prior study was performed on 07/30/2018. FINDINGS  Left Ventricle: Left ventricular ejection fraction, by estimation, is 55 to 60%. The left ventricle has normal function. The left ventricle has no regional wall motion abnormalities. The left ventricular internal cavity size was normal in size. There is  no left ventricular hypertrophy. The interventricular septum is flattened in  diastole ('D' shaped left ventricle), consistent with right ventricular volume overload. Left ventricular diastolic parameters are indeterminate. Right Ventricle: The right ventricular size is normal. No increase in right ventricular wall thickness. Right ventricular systolic function is normal. There is moderately elevated pulmonary artery systolic pressure. The tricuspid regurgitant velocity is 3.59 m/s, and with an assumed right atrial pressure of 8 mmHg, the estimated right ventricular systolic pressure is 02.5 mmHg. Left Atrium: Left atrial size was normal in size. Right Atrium: Right atrial size was normal in size. Pericardium: There is no evidence of pericardial effusion. Presence of epicardial fat layer. Mitral Valve: The mitral valve is normal in structure. No evidence of mitral valve regurgitation. No evidence of mitral valve stenosis. Tricuspid Valve: The tricuspid valve is normal in structure. Tricuspid valve regurgitation is moderate . No evidence of tricuspid stenosis. Aortic Valve: The aortic valve is normal in structure. There is mild thickening of the aortic valve. There is mild aortic valve annular calcification. Aortic valve regurgitation is mild. No aortic stenosis is present. Pulmonic Valve: The pulmonic valve was normal in structure. Pulmonic valve regurgitation is not visualized. No evidence of pulmonic stenosis. Aorta: The aortic root was not well visualized. Venous: The inferior vena cava is normal in size with greater than 50% respiratory variability, suggesting right atrial pressure of 3 mmHg. IAS/Shunts: No atrial level shunt detected by color flow Doppler.  LEFT VENTRICLE  PLAX 2D LVIDd:         5.10 cm LVIDs:         3.20 cm LV PW:         0.80 cm LV IVS:        1.10 cm LVOT diam:     2.00 cm LV SV:         62 LV SV Index:   30 LVOT Area:     3.14 cm  RIGHT VENTRICLE RV S prime:     10.60 cm/s TAPSE (M-mode): 2.4 cm LEFT ATRIUM             Index        RIGHT ATRIUM           Index LA  diam:        3.70 cm 1.77 cm/m   RA Area:     20.10 cm LA Vol (A2C):   48.4 ml 23.18 ml/m  RA Volume:   62.70 ml  30.03 ml/m LA Vol (A4C):   32.8 ml 15.74 ml/m LA Biplane Vol: 46.9 ml 22.47 ml/m  AORTIC VALVE LVOT Vmax:         111.27 cm/s LVOT Vmean:        84.700 cm/s LVOT VTI:          0.197 m AR Vena Contracta: 0.30 cm  AORTA Ao Root diam: 3.00 cm TRICUSPID VALVE TR Peak grad:   51.6 mmHg TR Vmax:        359.00 cm/s  SHUNTS Systemic VTI:  0.20 m Systemic Diam: 2.00 cm Kardie Tobb DO Electronically signed by Berniece Salines DO Signature Date/Time: 10/11/2021/12:00:57 PM    Final    VAS [place] LOWER EXTREMITY VENOUS (DVT)  Result Date: 10/19/2021  Lower Venous DVT Study Patient Name:  Bryan [name]  Date of Exam:   10/18/2021 Medical Rec #: [id number]      Accession #:    [id number] Date of Birth: July 26, 1946     Patient Gender: M Patient Age:   27 years Exam Location:  [facility] Procedure:      VAS [place] LOWER EXTREMITY VENOUS (DVT) Referring Phys: Kary Kos --------------------------------------------------------------------------------  Indications: Edema.  Risk Factors: Surgery. Limitations: Poor ultrasound/tissue interface and body habitus. Comparison Study: No prior studies. Performing Technologist: Oliver Hum RVT  Examination Guidelines: A complete evaluation includes B-mode imaging, spectral Doppler, color Doppler, and power Doppler as needed of all accessible portions of each vessel. Bilateral testing is considered an integral part of a complete examination. Limited examinations for reoccurring indications may be performed as noted. The reflux portion of the exam is performed with the patient in reverse Trendelenburg.  +---------+---------------+---------+-----------+----------+--------------+ RIGHT    CompressibilityPhasicitySpontaneityPropertiesThrombus Aging +---------+---------------+---------+-----------+----------+--------------+ CFV      Full           Yes      Yes                                  +---------+---------------+---------+-----------+----------+--------------+ SFJ      Full                                                        +---------+---------------+---------+-----------+----------+--------------+ FV Prox  Full                                                        +---------+---------------+---------+-----------+----------+--------------+  FV Mid   Full                                                        +---------+---------------+---------+-----------+----------+--------------+ FV DistalFull                                                        +---------+---------------+---------+-----------+----------+--------------+ PFV      Full                                                        +---------+---------------+---------+-----------+----------+--------------+ POP      Full           Yes      Yes                                 +---------+---------------+---------+-----------+----------+--------------+ PTV      Full                                                        +---------+---------------+---------+-----------+----------+--------------+ PERO     Full                                                        +---------+---------------+---------+-----------+----------+--------------+   +---------+---------------+---------+-----------+----------+--------------+ LEFT     CompressibilityPhasicitySpontaneityPropertiesThrombus Aging +---------+---------------+---------+-----------+----------+--------------+ CFV      Full           Yes      Yes                                 +---------+---------------+---------+-----------+----------+--------------+ SFJ      Full                                                        +---------+---------------+---------+-----------+----------+--------------+ FV Prox  Full                                                         +---------+---------------+---------+-----------+----------+--------------+ FV Mid   Full                                                        +---------+---------------+---------+-----------+----------+--------------+  FV DistalFull                                                        +---------+---------------+---------+-----------+----------+--------------+ PFV      Full                                                        +---------+---------------+---------+-----------+----------+--------------+ POP      Full           Yes      Yes                                 +---------+---------------+---------+-----------+----------+--------------+ PTV      Full                                                        +---------+---------------+---------+-----------+----------+--------------+ PERO     Full                                                        +---------+---------------+---------+-----------+----------+--------------+     Summary: RIGHT: - There is no evidence of deep vein thrombosis in the lower extremity. However, portions of this examination were limited- see technologist comments above.  - No cystic structure found in the popliteal fossa.  LEFT: - There is no evidence of deep vein thrombosis in the lower extremity. However, portions of this examination were limited- see technologist comments above.  - No cystic structure found in the popliteal fossa.  *See table(s) above for measurements and observations. Electronically signed by Orlie Pollen on 10/19/2021 at 5:03:34 PM.    Final     Antibiotics:  Anti-infectives (From admission, onward)    Start     Dose/Rate Route Frequency Ordered Stop   10/10/21 1000  levofloxacin (LEVAQUIN) tablet 250 mg  Status:  Discontinued        250 mg Oral Daily 10/09/21 0811 10/16/21 1041   10/09/21 1000  levofloxacin (LEVAQUIN) tablet 500 mg  Status:  Discontinued        500 mg Oral Daily 10/09/21 0758 10/09/21 0811    10/09/21 1000  levofloxacin (LEVAQUIN) tablet 500 mg        500 mg Oral Daily 10/09/21 0811 10/09/21 1116   10/02/21 2145  ceFAZolin (ANCEF) IVPB 2g/100 mL premix        2 g 200 mL/hr over 30 Minutes Intravenous Every 8 hours 10/02/21 2124 10/04/21 1353   10/02/21 1045  ceFAZolin (ANCEF) IVPB 2g/100 mL premix        2 g 200 mL/hr over 30 Minutes Intravenous On call to O.R. 10/02/21 1037 10/02/21 1300       Discharge Exam: Blood pressure 140/63, pulse (!) 104, temperature 98.7 F (37.1 C), temperature source Oral, resp. rate 20,  height 5' 5.5" (1.664 m), weight 101.6 kg, SpO2 99 %. Neurologic: Grossly normal Ambulating and voiding well incision cdi   Discharge Medications:   Allergies as of 10/20/2021       Reactions   Adalimumab Other (See Comments)   (Humira) Lymphoma   Ampicillin Nausea Only   Terazosin Other (See Comments)   Lisinopril Other (See Comments), Cough   Cough   Other Other (See Comments)   BLOOD PRODUCT REFUSAL (patient is a Jehov[employer]'s Witness)        Medication List     TAKE these medications    atorvastatin 20 MG tablet Commonly known as: LIPITOR Take 20 mg by mouth at bedtime.   B-12 1000 MCG Subl Place 2,000 mcg under the tongue daily.   gabapentin 600 MG tablet Commonly known as: NEURONTIN Take 600 mg by mouth 2 (two) times daily.   hydrochlorothiazide 25 MG tablet Commonly known as: HYDRODIURIL Take 1 tablet (25 mg total) by mouth daily.   iron polysaccharides 150 MG capsule Commonly known as: NIFEREX Take 1 capsule (150 mg total) by mouth daily.   methocarbamol 750 MG tablet Commonly known as: ROBAXIN Take 750 mg by mouth daily. What changed: Another medication with the same name was added. Make sure you understand how and when to take each.   methocarbamol 500 MG tablet Commonly known as: Robaxin Take 1 tablet (500 mg total) by mouth 4 (four) times daily. What changed: You were already taking a medication with the same name, and  this prescription was added. Make sure you understand how and when to take each.   metoprolol succinate 100 MG 24 hr tablet Commonly known as: TOPROL-XL TAKE 1 TABLET BY MOUTH ONCE DAILY WITH  OR  IMMEDIATELY  FOLLOWING  A  MEAL What changed:  how much to take when to take this additional instructions   nitroGLYCERIN 0.4 MG SL tablet Commonly known as: NITROSTAT Place 0.4 mg under the tongue every 5 (five) minutes as needed for chest pain.   oxyCODONE-acetaminophen 5-325 MG tablet Commonly known as: Percocet Take 1 tablet by mouth every 4 (four) hours as needed for severe pain.   primidone 50 MG tablet Commonly known as: MYSOLINE Take 150 mg by mouth in the morning and at bedtime.   rosuvastatin 20 MG tablet Commonly known as: CRESTOR Take 1 tablet (20 mg total) by mouth daily.   tamsulosin 0.4 MG Caps capsule Commonly known as: FLOMAX Take 0.4 mg by mouth at bedtime.   traMADol-acetaminophen 37.5-325 MG tablet Commonly known as: ULTRACET Take 1 tablet by mouth in the morning and at bedtime.               Durable Medical Equipment  (From admission, onward)           Start     Ordered   10/20/21 1103  DME 3-in-1  Once        10/20/21 1104   10/20/21 1103  DME Walker  Once       Question Answer Comment  Walker: With 5 Inch Wheels   Patient needs a walker to treat with the following condition Postoperative pain after spinal surgery      10/20/21 1104            Disposition: home   Final Dx: PLIf L2-3, L3-4  Discharge Instructions     Call MD for:  difficulty breathing, headache or visual disturbances   Complete by: As directed    Call MD for:  hives   Complete by: As directed    Call MD for:  persistant dizziness or light-headedness   Complete by: As directed    Call MD for:  persistant nausea and vomiting   Complete by: As directed    Call MD for:  redness, tenderness, or signs of infection (pain, swelling, redness, odor or green/yellow  discharge around incision site)   Complete by: As directed    Call MD for:  severe uncontrolled pain   Complete by: As directed    Call MD for:  temperature >100.4   Complete by: As directed    Diet - low sodium heart healthy   Complete by: As directed    Driving Restrictions   Complete by: As directed    No driving for 2 weeks, no riding in the car for 1 week   Increase activity slowly   Complete by: As directed    Lifting restrictions   Complete by: As directed    No lifting more than 8 lbs   No wound care   Complete by: As directed           Signed: Ocie Cornfield Ivyana Locey 10/20/2021, 11:04 AM

## 2021-10-24 NOTE — Progress Notes (Deleted)
Patient referred by [name], FNP for precordial pain  Subjective:   Bryan [name], male    DOB: January 31, 1946, 75 y.o.   MRN: [id number]   No chief complaint on file.    HPI  75 y.o. 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   Patient was last seen in our office 09/29/2021 for preoperative risk stratification at which time started rosuvastatin 20 mg p.o. daily advised him to resume aspirin after his upcoming surgery.  Patient was then admitted to [facility] 10/02/2021 and underwent successful lumbar fusion.  Patient tolerated the surgery without immediate complication, however during his hospital recovery he developed atrial fibrillation with RVR, confusion, fever, and anemia.  At that time cardiology was consulted for management of atrial fibrillation with RVR.  Patient converted to sinus rhythm with amiodarone, and continued Toprol-XL for rate control.  Patient's symptoms improved following antibiotic course and hospitalist team evaluated patient given postop fever and anemia.  Patient was discharged with metoprolol and no anticoagulation given Anemia.  Patient now presents for follow up and management of CAD, hypertension, paroxysmal atrial fibrillation.  Atrial fibrillation in the postop period likely triggered by postop fever and acute illness. ***  ***  Patient was last seen 08/05/2020 by Dr. Virgina Jock.  He was advised at that time to follow-up in 6 months, but unfortunately has been lost to follow-up until then.  He now presents for preoperative risk stratification.  Patient is presently scheduled to undergo lumbar fusion with Dr. Saintclair Halsted on 10/02/2021.  Patient has been doing well from a cardiovascular standpoint over the last year.  He states his physical activity is somewhat limited due to back and leg  pain, however he is able to walk approximately half a mile 3-4 times per week without chest pain, shortness of breath, dizziness, or significant fatigue.  He also is able to walk up about 1 flight of stairs without issue, with the exception of back and leg pain.  Patient denies chest pain, palpitations, dyspnea, syncope, and near syncope.  Denies orthopnea, PND, leg edema.  Notably patient stopped his atorvastatin approximately a month ago given concerns of memory loss.  However he is willing to rechallenge statin therapy with rosuvastatin.  Current Outpatient Medications on File Prior to Visit  Medication Sig Dispense Refill   atorvastatin (LIPITOR) 20 MG tablet Take 20 mg by mouth at bedtime.     Cyanocobalamin (B-12) 1000 MCG SUBL Place 2,000 mcg under the tongue daily. 60 tablet 5   gabapentin (NEURONTIN) 600 MG tablet Take 600 mg by mouth 2 (two) times daily.     hydrochlorothiazide (HYDRODIURIL) 25 MG tablet Take 1 tablet (25 mg total) by mouth daily. 90 tablet 0   iron polysaccharides (NIFEREX) 150 MG capsule Take 1 capsule (150 mg total) by mouth daily. 30 capsule 5   methocarbamol (ROBAXIN) 500 MG tablet Take 1 tablet (500 mg total) by mouth 4 (four) times daily. 45 tablet 0   methocarbamol (ROBAXIN) 750 MG tablet Take 750 mg by mouth daily.     metoprolol succinate (TOPROL-XL) 100 MG 24 hr tablet TAKE 1 TABLET BY MOUTH ONCE DAILY WITH  OR  IMMEDIATELY  FOLLOWING  A  MEAL (Patient taking differently: 100 mg 2 (two) times daily.) 90 tablet 2   nitroGLYCERIN (NITROSTAT) 0.4 MG SL tablet Place 0.4 mg under the tongue  every 5 (five) minutes as needed for chest pain.     oxyCODONE-acetaminophen (PERCOCET) 5-325 MG tablet Take 1 tablet by mouth every 4 (four) hours as needed for severe pain. 20 tablet 0   primidone (MYSOLINE) 50 MG tablet Take 150 mg by mouth in the morning and at bedtime.     rosuvastatin (CRESTOR) 20 MG tablet Take 1 tablet (20 mg total) by mouth daily. (Patient not taking:  Reported on 10/03/2021) 30 tablet 3   Tamsulosin HCl (FLOMAX) 0.4 MG CAPS Take 0.4 mg by mouth at bedtime.     traMADol-acetaminophen (ULTRACET) 37.5-325 MG tablet Take 1 tablet by mouth in the morning and at bedtime.     No current facility-administered medications on file prior to visit.    Cardiovascular and other pertinent studies: ***  Echocardiogram 10/11/2021:  1. Left ventricular ejection fraction, by estimation, is 55 to 60%. The left ventricle has normal function. The left ventricle has no regional wall motion abnormalities. Left ventricular diastolic parameters are  indeterminate. There is the interventricular septum is flattened in diastole ('D' shaped left ventricle), consistent with right ventricular volume overload.   2. Right ventricular systolic function is normal. The right ventricular size is normal. There is moderately elevated pulmonary artery systolic pressure.   3. The mitral valve is normal in structure. No evidence of mitral valve regurgitation. No evidence of mitral stenosis.   4. Tricuspid valve regurgitation is moderate.   5. The aortic valve is normal in structure. There is mild thickening of the aortic valve. Aortic valve regurgitation is mild. No aortic stenosis is present.   6. The inferior vena cava is normal in size with greater than 50% respiratory variability, suggesting right atrial pressure of 3 mmHg.   EKG 09/29/2021: Sinus rhythm with single PAC at a rate of 81 bpm.  Normal axis.  Right bundle branch block.  No evidence of ischemia or underlying injury pattern.  Compared to EKG 03/25/2020, no significant change.   Event monitor 04/28/2020 - 05/11/2020: Diagnostic time: 74%  Dominant rhythm: Sinus. HR 52-109 bpm. Avg HR 68 bpm. 5 beat NSVT at 2:44 am CT on 04/28/2020.  Recurring episodes of atrial tachycardia <30 sec on 05/06/2020 10:14 AM CT No atrial fibrillation/atrial flutter/SVT/VT/high grade AV block, sinus pause >3sec noted. Symptoms reported:  None  Lexiscan Tetrofosmin stress test 04/13/2020: Lexiscan/modified Bruce nuclear stress test performed using 1-day protocol. Stress EKG is non-diagnostic, as this is pharmacological stress test. In addition, stress EKG at 67% MPHR showed sinus tachycardia, RBBB, LAFB.  SPECT images show small sized, mild intensity, reversible perfusion defect in apical anteroseptal myocardium. Stress LVEF 42%. Intermediate risk study.  EKG 03/25/2020: Sinus rhythm 84 bpm Right bundle branch block.  Frequent PAC s   CTA chest 02/2020: Negative for PE  Coronary CT angiogram 07/2018: 1. Coronary calcium score of 21. This was 57 percentile for age and sex matched control. 2. Normal coronary origin with right dominance. 3. There is mild to moderate CAD in the proximal and mid LAD. Aggressive risk factor modification is recommended.  Recent labs: CMP Latest Ref Rng & Units 10/20/2021 10/19/2021 10/18/2021  Glucose 70 - 99 mg/dL 93 98 112(H)  BUN 8 - 23 mg/dL 19 20 27(H)  Creatinine 0.61 - 1.24 mg/dL 1.22 1.21 1.52(H)  Sodium 135 - 145 mmol/L 138 135 140  Potassium 3.5 - 5.1 mmol/L 3.8 3.3(L) 3.4(L)  Chloride 98 - 111 mmol/L 106 106 107  CO2 22 - 32 mmol/L 23 21(L) 23  Calcium 8.9 - 10.3 mg/dL 7.8(L) 7.8(L) 7.8(L)  Total Protein 6.5 - 8.1 g/dL - - -  Total Bilirubin 0.3 - 1.2 mg/dL - - -  Alkaline Phos 38 - 126 U/L - - -  AST 15 - 41 U/L - - -  ALT 0 - 44 U/L - - -   CBC Latest Ref Rng & Units 10/17/2021 10/16/2021 10/14/2021  WBC 4.0 - 10.5 K/uL 7.8 8.6 10.4  Hemoglobin 13.0 - 17.0 g/dL 6.5(LL) 6.3(LL) 6.7(LL)  Hematocrit 39.0 - 52.0 % 20.5(L) 19.9(L) 21.1(L)  Platelets 150 - 400 K/uL 343 360 410(H)   Lipid Panel     Component Value Date/Time   CHOL 168 02/01/2017 0845   TRIG 65 02/01/2017 0845   HDL 69 02/01/2017 0845   CHOLHDL 2.4 02/01/2017 0845   CHOLHDL 3.6 05/12/2014 0926   VLDL 19 05/12/2014 0926   LDLCALC 86 02/01/2017 0845   HEMOGLOBIN A1C Lab Results  Component Value Date   HGBA1C  5.5 10/31/2018   TSH No results for input(s): TSH in the last 8760 hours.  03/04/2020: Glucose 80, BUN/Cr 18/1.25. EGFR >60. Na/K 147/3.7.  H/H 10.8/34.6. MCV 93.8. Platelets 257 HbA1C N/A  2018: Chol 168, TG 65, HDL 69, LDL 86 TSH normal    Review of Systems  Constitutional: Negative for malaise/fatigue and weight gain.  Cardiovascular:  Negative for chest pain, claudication, dyspnea on exertion, leg swelling, near-syncope, orthopnea, palpitations, paroxysmal nocturnal dyspnea and syncope.  Respiratory:  Negative for shortness of breath.   Neurological:  Negative for dizziness.       [name]    There is no height or weight on file to calculate BMI. [name]    Objective:   Physical Exam Vitals reviewed.  Constitutional:      General: He is not in acute distress.    Appearance: He is obese.  HENT:     Head: Normocephalic and atraumatic.  Neck:     Vascular: No JVD.  Cardiovascular:     Rate and Rhythm: Normal rate and regular rhythm.     Pulses: Intact distal pulses.     Heart sounds: Normal heart sounds, S1 normal and S2 normal. No murmur heard.   No gallop.  Pulmonary:     Effort: Pulmonary effort is normal. No respiratory distress.     Breath sounds: Normal breath sounds. No wheezing, rhonchi or rales.  Musculoskeletal:     Right lower leg: No edema.     Left lower leg: No edema.  Neurological:     Mental Status: He is alert.       Assessment & Recommendations:   75 y.o. 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  Diagnosed with paroxysmal atrial fibrillation during hospitalization 09/2021.  Cardiac risk stratification: Patient's physical exam and EKG are unchanged compared to previous office visit. There is no  clinical evidence of heart failure and EKG shows no evidence of acute ischemic changes.  Although is back pain limits patient's physical activity he reports good functional status.  Patient's previous stress test showed small sized, mild intensity, reversible perfusion defect in apical anteroseptal myocardium, however he has had not recurrence of chest pain or anginal equivalent in the last 1.5 years with medical therapy.  Discussed with patient risk vs. Benefit of repeat stress  test. However, as his functional capacity is > 4 METS, he is asymptomatic from a cardiovascular standpoint, EKG is unchanged compared to 03/2020, and stress test was relatively recent shared decision was not to delay surgery for further cardiac testing. Patient is aware that compared to others of his age and gender without his cardiovascular history he is at higher surgical risk, but overall he is relatively low risk and may proceed with surgery.   Paroxysmal atrial fibrillation: ***  CAD:*** Mild prox CAD CCTA 2019. Mild anteroseptal ischemia stress test 04/2020. Patient has been asymptomatic with medical therapy  Start rosuvastatin 20 mg nightly. Patient had stopped atorvastatin due to memory loss concerns.  He is also not taking Asprin, will plan to resume after surgery as hemoglobin has improved since last visit  Hypertension:*** Relatively well controlled. Patient's back pain is likely contributing to to mild elevation in blood pressure.   Follow up in 3 months, sooner if needed.    Alethia Berthold, PA-C 10/24/2021, 12:51 PM Office: [phone]

## 2021-10-25 ENCOUNTER — Other Ambulatory Visit: Payer: Self-pay

## 2021-10-25 ENCOUNTER — Ambulatory Visit: Payer: Medicare HMO | Admitting: Student

## 2021-10-25 DIAGNOSIS — C8580 Other specified types of non-Hodgkin lymphoma, unspecified site: Secondary | ICD-10-CM

## 2021-10-25 DIAGNOSIS — I480 Paroxysmal atrial fibrillation: Secondary | ICD-10-CM

## 2021-10-25 DIAGNOSIS — I10 Essential (primary) hypertension: Secondary | ICD-10-CM

## 2021-10-25 DIAGNOSIS — I2510 Atherosclerotic heart disease of native coronary artery without angina pectoris: Secondary | ICD-10-CM

## 2021-10-25 MED ORDER — POLYSACCHARIDE IRON COMPLEX 150 MG PO CAPS: 150.0000 mg | ORAL_CAPSULE | Freq: Every day | ORAL | 5 refills | Status: DC

## 2021-10-31 ENCOUNTER — Other Ambulatory Visit: Payer: Self-pay

## 2021-10-31 DIAGNOSIS — C8580 Other specified types of non-Hodgkin lymphoma, unspecified site: Secondary | ICD-10-CM

## 2021-10-31 MED ORDER — POLYSACCHARIDE IRON COMPLEX 150 MG PO CAPS: 150.0000 mg | ORAL_CAPSULE | Freq: Every day | ORAL | 5 refills | Status: DC

## 2021-12-05 DIAGNOSIS — M1990 Unspecified osteoarthritis, unspecified site: Secondary | ICD-10-CM | POA: Insufficient documentation

## 2021-12-05 DIAGNOSIS — G4733 Obstructive sleep apnea (adult) (pediatric): Secondary | ICD-10-CM

## 2021-12-05 DIAGNOSIS — Z8571 Personal history of Hodgkin lymphoma: Secondary | ICD-10-CM | POA: Insufficient documentation

## 2021-12-05 DIAGNOSIS — C884 Extranodal marginal zone B-cell lymphoma of mucosa-associated lymphoid tissue [MALT-lymphoma]: Secondary | ICD-10-CM | POA: Insufficient documentation

## 2021-12-05 DIAGNOSIS — I2699 Other pulmonary embolism without acute cor pulmonale: Secondary | ICD-10-CM | POA: Insufficient documentation

## 2021-12-05 DIAGNOSIS — D649 Anemia, unspecified: Secondary | ICD-10-CM | POA: Insufficient documentation

## 2021-12-05 DIAGNOSIS — E162 Hypoglycemia, unspecified: Secondary | ICD-10-CM | POA: Insufficient documentation

## 2021-12-05 DIAGNOSIS — F411 Generalized anxiety disorder: Secondary | ICD-10-CM

## 2021-12-05 DIAGNOSIS — G894 Chronic pain syndrome: Secondary | ICD-10-CM | POA: Insufficient documentation

## 2021-12-05 DIAGNOSIS — E782 Mixed hyperlipidemia: Secondary | ICD-10-CM | POA: Insufficient documentation

## 2021-12-05 DIAGNOSIS — I10 Essential (primary) hypertension: Secondary | ICD-10-CM | POA: Insufficient documentation

## 2021-12-05 DIAGNOSIS — I4892 Unspecified atrial flutter: Secondary | ICD-10-CM | POA: Insufficient documentation

## 2021-12-05 HISTORY — DX: Generalized anxiety disorder: F41.1

## 2022-01-01 ENCOUNTER — Ambulatory Visit: Payer: Medicare HMO | Admitting: Student

## 2022-01-13 ENCOUNTER — Encounter (HOSPITAL_BASED_OUTPATIENT_CLINIC_OR_DEPARTMENT_OTHER): Payer: Self-pay | Admitting: Emergency Medicine

## 2022-01-13 ENCOUNTER — Other Ambulatory Visit: Payer: Self-pay

## 2022-01-13 ENCOUNTER — Emergency Department (HOSPITAL_BASED_OUTPATIENT_CLINIC_OR_DEPARTMENT_OTHER): Payer: No Typology Code available for payment source | Admitting: Radiology

## 2022-01-13 ENCOUNTER — Emergency Department (HOSPITAL_BASED_OUTPATIENT_CLINIC_OR_DEPARTMENT_OTHER)
Admission: EM | Admit: 2022-01-13 | Discharge: 2022-01-13 | Disposition: A | Discharge status: Home / Self Care | Payer: No Typology Code available for payment source | Attending: Emergency Medicine | Admitting: Emergency Medicine

## 2022-01-13 DIAGNOSIS — I10 Essential (primary) hypertension: Secondary | ICD-10-CM | POA: Diagnosis not present

## 2022-01-13 DIAGNOSIS — Z79899 Other long term (current) drug therapy: Secondary | ICD-10-CM | POA: Insufficient documentation

## 2022-01-13 DIAGNOSIS — R079 Chest pain, unspecified: Secondary | ICD-10-CM

## 2022-01-13 DIAGNOSIS — I4891 Unspecified atrial fibrillation: Secondary | ICD-10-CM | POA: Insufficient documentation

## 2022-01-13 DIAGNOSIS — R0789 Other chest pain: Secondary | ICD-10-CM | POA: Insufficient documentation

## 2022-01-13 DIAGNOSIS — Z7901 Long term (current) use of anticoagulants: Secondary | ICD-10-CM | POA: Insufficient documentation

## 2022-01-13 DIAGNOSIS — D649 Anemia, unspecified: Secondary | ICD-10-CM | POA: Diagnosis not present

## 2022-01-13 DIAGNOSIS — R12 Heartburn: Secondary | ICD-10-CM | POA: Insufficient documentation

## 2022-01-13 LAB — BASIC METABOLIC PANEL
Anion gap: 11 (ref 5–15)
BUN: 26 mg/dL — ABNORMAL HIGH (ref 8–23)
CO2: 29 mmol/L (ref 22–32)
Calcium: 8.4 mg/dL — ABNORMAL LOW (ref 8.9–10.3)
Chloride: 99 mmol/L (ref 98–111)
Creatinine, Ser: 1.49 mg/dL — ABNORMAL HIGH (ref 0.61–1.24)
GFR, Estimated: 48 mL/min — ABNORMAL LOW (ref >60)
Glucose, Bld: 83 mg/dL (ref 70–99)
Potassium: 3.7 mmol/L (ref 3.5–5.1)
Sodium: 139 mmol/L (ref 135–145)

## 2022-01-13 LAB — TROPONIN I (HIGH SENSITIVITY)
Troponin I (High Sensitivity): 14 ng/L (ref <18)
Troponin I (High Sensitivity): 11 ng/L (ref <18)

## 2022-01-13 LAB — CBC
HCT: 33 % — ABNORMAL LOW (ref 39.0–52.0)
Hemoglobin: 10.2 g/dL — ABNORMAL LOW (ref 13.0–17.0)
MCH: 29.3 pg (ref 26.0–34.0)
MCHC: 30.9 g/dL (ref 30.0–36.0)
MCV: 94.8 fL (ref 80.0–100.0)
Platelets: 285 10*3/uL (ref 150–400)
RBC: 3.48 MIL/uL — ABNORMAL LOW (ref 4.22–5.81)
RDW: 16.2 % — ABNORMAL HIGH (ref 11.5–15.5)
WBC: 7.1 10*3/uL (ref 4.0–10.5)
nRBC: 0 % (ref 0.0–0.2)

## 2022-01-13 MED ORDER — ALUM & MAG HYDROXIDE-SIMETH 200-200-20 MG/5ML PO SUSP
30.0000 mL | Freq: Once | ORAL | Status: AC
  Administered 2022-01-13: 30 mL via ORAL
  Filled 2022-01-13: qty 30

## 2022-01-13 MED ORDER — SUCRALFATE 1 G PO TABS
1.0000 g | ORAL_TABLET | Freq: Three times a day (TID) | ORAL | 0 refills | Status: DC | PRN
Start: 2022-01-13 — End: 2022-03-21

## 2022-01-13 MED ORDER — FAMOTIDINE 20 MG PO TABS: 20.0000 mg | ORAL_TABLET | Freq: Two times a day (BID) | ORAL | 0 refills | Status: DC

## 2022-01-13 MED ORDER — LIDOCAINE VISCOUS HCL 2 % MT SOLN
15.0000 mL | Freq: Once | OROMUCOSAL | Status: AC
  Administered 2022-01-13: 15 mL via ORAL
  Filled 2022-01-13: qty 15

## 2022-01-13 MED ORDER — FAMOTIDINE IN NACL 20-0.9 MG/50ML-% IV SOLN
20.0000 mg | Freq: Once | INTRAVENOUS | Status: AC
  Administered 2022-01-13: 20 mg via INTRAVENOUS
  Filled 2022-01-13: qty 50

## 2022-01-13 NOTE — ED Triage Notes (Signed)
Pt endorses mid chest pain and burning for a few days that has worsened. Pt tried pepcid with no relief. Pt reports being on a blood thinner for multiple clots.  ?

## 2022-01-13 NOTE — Discharge Instructions (Addendum)
There is no evidence of damage to your heart and your work-up today.  Since your symptoms resolved with the medications I provided in the emergency department suspicion is that this is related to heartburn.  I recommend you begin taking Pepcid twice daily as we discussed, you can take the Carafate as needed up to 3 times daily with meals to help with acid reflux symptoms. ? ?Please follow-up with your primary care doctor and specialists for follow-up regarding your recent hospitalization, please return to the emergency department if you have persistent chest pain, shortness of breath, new fever, chills. ?

## 2022-01-13 NOTE — ED Provider Notes (Signed)
[facility] EMERGENCY DEPT Provider Note   CSN: [id number] Arrival date & time: 01/13/22  1148     History  Chief Complaint  Patient presents with   Heartburn    Bryan [name] is a 76 y.o. male with a PMH significant for hypertension, acid reflux, obesity, lymphoma who presents with concern for mid chest pain and burning for few days that has gotten worse.  Patient was also recently hospitalized with DVT in the right leg, blood clots in the lung, and started on a new blood thinner.  Patient does not know what the blood thinner is.  He reports that he has been taking as prescribed however.  While patient was hospitalized he developed Stevens-Johnson syndrome as well and has been taking some antibiotics.  He reports that his chest pain has been on and off for 4 days, but constant since this morning.  He describes it as a burning sensation in the center of his chest similar to previous heartburn.  He denies any pressure, sharp pain, radiation to arms or neck, shortness of breath, nausea, vomiting, diaphoresis.  He does feel like it is somewhat worse with exertion.  On review of his hospital course he had A-fib with RVR, needed Cardizem drip, and underwent a stress test that was negative for ischemia or infarct just around a month ago.  He was started on new metoprolol 100 mg twice daily, Cardizem 120 mg twice daily, he was started on Pradaxa instead of Eliquis or Xarelto.  On further review patient also had C. difficile at the time with concern for pseudomembranous infection.   Heartburn Associated symptoms include chest pain.      Home Medications Prior to Admission medications   Medication Sig Start Date End Date Taking? Authorizing Provider  famotidine (PEPCID) 20 MG tablet Take 1 tablet (20 mg total) by mouth 2 (two) times daily. 01/13/22  Yes Safa Derner H, PA-C  sucralfate (CARAFATE) 1 g tablet Take 1 tablet (1 g total) by mouth 3 (three) times daily as needed.  01/13/22  Yes Abednego Yeates H, PA-C  atorvastatin (LIPITOR) 20 MG tablet Take 20 mg by mouth at bedtime.    [provider]  Cyanocobalamin (B-12) 1000 MCG SUBL Place 2,000 mcg under the tongue daily. 10/04/21   [name], MD  gabapentin (NEURONTIN) 600 MG tablet Take 600 mg by mouth 2 (two) times daily. 01/05/20   [provider]  hydrochlorothiazide (HYDRODIURIL) 25 MG tablet Take 1 tablet (25 mg total) by mouth daily. 12/24/19   [name], MD  iron polysaccharides (NIFEREX) 150 MG capsule Take 1 capsule (150 mg total) by mouth daily. 10/31/21   [name], MD  methocarbamol (ROBAXIN) 500 MG tablet Take 1 tablet (500 mg total) by mouth 4 (four) times daily. 10/20/21   Meyran, Ocie Cornfield, NP  methocarbamol (ROBAXIN) 750 MG tablet Take 750 mg by mouth daily.    [provider]  metoprolol succinate (TOPROL-XL) 100 MG 24 hr tablet TAKE 1 TABLET BY MOUTH ONCE DAILY WITH  OR  IMMEDIATELY  FOLLOWING  A  MEAL Patient taking differently: 100 mg 2 (two) times daily. 05/10/20   Patwardhan, Reynold Bowen, MD  nitroGLYCERIN (NITROSTAT) 0.4 MG SL tablet Place 0.4 mg under the tongue every 5 (five) minutes as needed for chest pain.    [provider]  oxyCODONE-acetaminophen (PERCOCET) 5-325 MG tablet Take 1 tablet by mouth every 4 (four) hours as needed for severe pain. 10/20/21 10/20/22  Meyran,  Ocie Cornfield, NP  primidone (MYSOLINE) 50 MG tablet Take 150 mg by mouth in the morning and at bedtime.    [provider]  rosuvastatin (CRESTOR) 20 MG tablet Take 1 tablet (20 mg total) by mouth daily. Patient not taking: Reported on 10/03/2021 09/29/21 01/27/22  Alethia Berthold, PA-C  Tamsulosin HCl (FLOMAX) 0.4 MG CAPS Take 0.4 mg by mouth at bedtime.    [provider]  traMADol-acetaminophen (ULTRACET) 37.5-325 MG tablet Take 1 tablet by mouth in the morning and at bedtime.    [provider]      Allergies     Adalimumab, Ampicillin, Terazosin, Lisinopril, and Other    Review of Systems   Review of Systems  Cardiovascular:  Positive for chest pain.  Gastrointestinal:  Positive for heartburn.  All other systems reviewed and are negative.  Physical Exam Updated Vital Signs BP 119/61 (BP Location: Right Arm)    Pulse (!) 114    Temp 99.3 F (37.4 C) (Oral)    Resp 18    Ht 5' 5.5" (1.664 m)    Wt 101 kg    SpO2 98%    BMI 36.49 kg/m  Physical Exam Vitals and nursing note reviewed.  Constitutional:      General: He is not in acute distress.    Appearance: Normal appearance.  HENT:     Head: Normocephalic and atraumatic.  Eyes:     General:        Right eye: No discharge.        Left eye: No discharge.  Cardiovascular:     Rate and Rhythm: Normal rate and regular rhythm.     Heart sounds: No murmur heard.   No friction rub. No gallop.  Pulmonary:     Effort: Pulmonary effort is normal.     Breath sounds: Normal breath sounds.  Abdominal:     General: Bowel sounds are normal.     Palpations: Abdomen is soft.     Comments: Tenderness to palpation epigastric region, with radiation into middle chest wall.  No distention noted.  No ecchymosis noted.  Skin:    General: Skin is warm and dry.     Capillary Refill: Capillary refill takes less than 2 seconds.  Neurological:     Mental Status: He is alert and oriented to person, place, and time.  Psychiatric:        Mood and Affect: Mood normal.        Behavior: Behavior normal.    ED Results / Procedures / Treatments   Labs (all labs ordered are listed, but only abnormal results are displayed) Labs Reviewed  BASIC METABOLIC PANEL - Abnormal; Notable for the following components:      Result Value   BUN 26 (*)    Creatinine, Ser 1.49 (*)    Calcium 8.4 (*)    GFR, Estimated 48 (*)    All other components within normal limits  CBC - Abnormal; Notable for the following components:   RBC 3.48 (*)    Hemoglobin 10.2 (*)    HCT 33.0  (*)    RDW 16.2 (*)    All other components within normal limits  TROPONIN I (HIGH SENSITIVITY)  TROPONIN I (HIGH SENSITIVITY)    EKG EKG Interpretation  Date/Time:  Saturday January 13 2022 12:00:25 EST Ventricular Rate:  66 PR Interval:  154 QRS Duration: 132 QT Interval:  420 QTC Calculation: 440 R Axis:   59 Text Interpretation: Sinus rhythm Atrial  premature complex Right bundle branch block similar to dec 2022 Confirmed by Sherwood Gambler [phone]) on 01/13/2022 12:10:36 PM  Radiology DG Chest 2 View  Result Date: 01/13/2022 CLINICAL DATA:  Heartburn EXAM: CHEST - 2 VIEW COMPARISON:  Chest radiograph dated October 18, 2021. FINDINGS: The heart size and mediastinal contours are within normal limits. Aorta is tortuous. Both lungs are clear without evidence of focal consolidation or pleural effusion. The visualized skeletal structures are unremarkable. IMPRESSION: No active cardiopulmonary disease. Electronically Signed   By: Keane Police D.O.   On: 01/13/2022 12:31    Procedures Procedures    Medications Ordered in ED Medications  famotidine (PEPCID) IVPB 20 mg premix (0 mg Intravenous Stopped 01/13/22 1413)  alum & mag hydroxide-simeth (MAALOX/MYLANTA) 200-200-20 MG/5ML suspension 30 mL (30 mLs Oral Given 01/13/22 1316)    And  lidocaine (XYLOCAINE) 2 % viscous mouth solution 15 mL (15 mLs Oral Given 01/13/22 1316)    ED Course/ Medical Decision Making/ A&P                           Medical Decision Making Amount and/or Complexity of Data Reviewed Labs: ordered. Radiology: ordered.   This patient presents to the ED for concern of sternal chest burning sensation that he reports is similar to previous episodes of heartburn, this involves an extensive number of treatment options, and is a complaint that carries with it a high risk of complications and morbidity. The emergent differential diagnosis prior to evaluation includes, but is not limited to, ACS, Boerhaave's, Mallory-Weiss,  GERD, gastric ulcer, duodenal ulcer, pulmonary embolism, aortic dissection, or aneurysm.   Past Medical History / Co-morbidities: Recent hospitalization for A-fib, blood clots who is currently on anticoagulation, history of hypertension, questionable history of diabetes, notable history of recent cardiac stress test for recent admission which was clear of any blockage or abnormality, inducible ischemia  Additional history: Additional history obtained from patient's wife. External records from outside source obtained and reviewed including recent outpatient hospitalization with [facility].  Physical Exam: Physical exam performed. The pertinent findings include: Tenderness to palpation in the sternal region, some upper abdominal tenderness to palpation.  No rebound, rigidity, guarding.  Overall well-appearing patient.  Lab Tests: I ordered, and personally interpreted labs.  The pertinent results include: CBC with mild anemia with hemoglobin of 10.2, significantly improved from significant anemia 2 months ago.  BMP with slightly increased creatinine, but no acute kidney injury compared to prior.  Negative troponin x2.   Imaging Studies: I ordered imaging studies including plain film radiograph of the chest. I independently visualized and interpreted imaging which showed no acute intrathoracic abnormality. I agree with the radiologist interpretation.   Cardiac Monitoring:  The patient was maintained on a cardiac monitor.  My attending physician Dr. Regenia Skeeter viewed and interpreted the cardiac monitored which showed an underlying rhythm of: Occasional premature beats, right bundle branch block, overall stable compared to previous   Medications: I ordered medication including Pepcid, Maalox Mylanta, viscous lidocaine for acid reflux, burning abdominal pain. Reevaluation of the patient after these medicines showed that the patient improved. I have reviewed the patients home medicines and have made  adjustments as needed.  Disposition: All this patient does have an elevated heart score at 5, he does have recent negative cardiac catheterization, and story that is not consistent with cardiac cause of chest pain.  While he endorses possible exertional chest pain it seems that it is more related to  positional changes.  He has complete resolution of chest pain with acid reflux medications.  He continues to remain in stable condition.  Discussed with patient and he agrees to close follow-up with PCP, and presumptive trial for acid reflux medications.  Discussed extensive return precautions due to the severity of possible complications due to chest pain.  However this clinician believes that this chest pain seems consistent with acid reflux pain.  Patient understands agrees plan, is discharged in stable condition at this time.  Final Clinical Impression(s) / ED Diagnoses Final diagnoses:  Heartburn  Chest pain, unspecified type    Rx / DC Orders ED Discharge Orders          Ordered    sucralfate (CARAFATE) 1 g tablet  3 times daily PRN        01/13/22 1502    famotidine (PEPCID) 20 MG tablet  2 times daily        01/13/22 1502              Clancy Leiner, [place] H, PA-C 01/13/22 1519    Sherwood Gambler, MD 01/14/22 0710

## 2022-01-19 ENCOUNTER — Other Ambulatory Visit: Payer: Self-pay

## 2022-01-19 ENCOUNTER — Encounter (HOSPITAL_COMMUNITY): Payer: Self-pay

## 2022-01-19 ENCOUNTER — Emergency Department (HOSPITAL_COMMUNITY): Payer: No Typology Code available for payment source

## 2022-01-19 ENCOUNTER — Observation Stay (HOSPITAL_COMMUNITY)
Admission: EM | Admit: 2022-01-19 | Discharge: 2022-01-21 | Disposition: A | Discharge status: Home / Self Care | Payer: No Typology Code available for payment source | Attending: Internal Medicine | Admitting: Internal Medicine

## 2022-01-19 DIAGNOSIS — E669 Obesity, unspecified: Secondary | ICD-10-CM | POA: Diagnosis present

## 2022-01-19 DIAGNOSIS — Z79899 Other long term (current) drug therapy: Secondary | ICD-10-CM | POA: Diagnosis not present

## 2022-01-19 DIAGNOSIS — Z800 Family history of malignant neoplasm of digestive organs: Secondary | ICD-10-CM | POA: Insufficient documentation

## 2022-01-19 DIAGNOSIS — Z86711 Personal history of pulmonary embolism: Secondary | ICD-10-CM | POA: Insufficient documentation

## 2022-01-19 DIAGNOSIS — Z86718 Personal history of other venous thrombosis and embolism: Secondary | ICD-10-CM | POA: Insufficient documentation

## 2022-01-19 DIAGNOSIS — N400 Enlarged prostate without lower urinary tract symptoms: Secondary | ICD-10-CM | POA: Diagnosis present

## 2022-01-19 DIAGNOSIS — I5033 Acute on chronic diastolic (congestive) heart failure: Secondary | ICD-10-CM | POA: Insufficient documentation

## 2022-01-19 DIAGNOSIS — R0781 Pleurodynia: Secondary | ICD-10-CM | POA: Diagnosis present

## 2022-01-19 DIAGNOSIS — J45909 Unspecified asthma, uncomplicated: Secondary | ICD-10-CM | POA: Diagnosis not present

## 2022-01-19 DIAGNOSIS — I4891 Unspecified atrial fibrillation: Principal | ICD-10-CM | POA: Insufficient documentation

## 2022-01-19 DIAGNOSIS — R778 Other specified abnormalities of plasma proteins: Secondary | ICD-10-CM | POA: Insufficient documentation

## 2022-01-19 DIAGNOSIS — M4316 Spondylolisthesis, lumbar region: Secondary | ICD-10-CM | POA: Insufficient documentation

## 2022-01-19 DIAGNOSIS — G250 Essential tremor: Secondary | ICD-10-CM | POA: Diagnosis present

## 2022-01-19 DIAGNOSIS — G4733 Obstructive sleep apnea (adult) (pediatric): Secondary | ICD-10-CM

## 2022-01-19 DIAGNOSIS — I2510 Atherosclerotic heart disease of native coronary artery without angina pectoris: Secondary | ICD-10-CM | POA: Insufficient documentation

## 2022-01-19 DIAGNOSIS — I110 Hypertensive heart disease with heart failure: Secondary | ICD-10-CM | POA: Diagnosis not present

## 2022-01-19 DIAGNOSIS — E119 Type 2 diabetes mellitus without complications: Secondary | ICD-10-CM | POA: Diagnosis not present

## 2022-01-19 DIAGNOSIS — Z87891 Personal history of nicotine dependence: Secondary | ICD-10-CM | POA: Diagnosis not present

## 2022-01-19 DIAGNOSIS — Z20822 Contact with and (suspected) exposure to covid-19: Secondary | ICD-10-CM | POA: Diagnosis not present

## 2022-01-19 DIAGNOSIS — Z809 Family history of malignant neoplasm, unspecified: Secondary | ICD-10-CM | POA: Diagnosis not present

## 2022-01-19 DIAGNOSIS — I10 Essential (primary) hypertension: Secondary | ICD-10-CM | POA: Diagnosis present

## 2022-01-19 DIAGNOSIS — Z531 Procedure and treatment not carried out because of patient's decision for reasons of belief and group pressure: Secondary | ICD-10-CM

## 2022-01-19 LAB — CBC WITH DIFFERENTIAL/PLATELET
Abs Immature Granulocytes: 0.03 10*3/uL (ref 0.00–0.07)
Basophils Absolute: 0 10*3/uL (ref 0.0–0.1)
Basophils Relative: 0 %
Eosinophils Absolute: 0 10*3/uL (ref 0.0–0.5)
Eosinophils Relative: 0 %
HCT: 33.7 % — ABNORMAL LOW (ref 39.0–52.0)
Hemoglobin: 10.7 g/dL — ABNORMAL LOW (ref 13.0–17.0)
Immature Granulocytes: 0 %
Lymphocytes Relative: 9 %
Lymphs Abs: 0.9 10*3/uL (ref 0.7–4.0)
MCH: 29.6 pg (ref 26.0–34.0)
MCHC: 31.8 g/dL (ref 30.0–36.0)
MCV: 93.4 fL (ref 80.0–100.0)
Monocytes Absolute: 0.7 10*3/uL (ref 0.1–1.0)
Monocytes Relative: 7 %
Neutro Abs: 8 10*3/uL — ABNORMAL HIGH (ref 1.7–7.7)
Neutrophils Relative %: 84 %
Platelets: 330 10*3/uL (ref 150–400)
RBC: 3.61 MIL/uL — ABNORMAL LOW (ref 4.22–5.81)
RDW: 15.9 % — ABNORMAL HIGH (ref 11.5–15.5)
WBC: 9.6 10*3/uL (ref 4.0–10.5)
nRBC: 0 % (ref 0.0–0.2)

## 2022-01-19 LAB — URINALYSIS, ROUTINE W REFLEX MICROSCOPIC
Bacteria, UA: NONE SEEN
Bilirubin Urine: NEGATIVE
Glucose, UA: NEGATIVE mg/dL
Ketones, ur: NEGATIVE mg/dL
Leukocytes,Ua: NEGATIVE
Nitrite: NEGATIVE
Protein, ur: 30 mg/dL — AB
Specific Gravity, Urine: 1.011 (ref 1.005–1.030)
pH: 5 (ref 5.0–8.0)

## 2022-01-19 LAB — COMPREHENSIVE METABOLIC PANEL
ALT: 13 U/L (ref 0–44)
AST: 17 U/L (ref 15–41)
Albumin: 3.2 g/dL — ABNORMAL LOW (ref 3.5–5.0)
Alkaline Phosphatase: 124 U/L (ref 38–126)
Anion gap: 11 (ref 5–15)
BUN: 16 mg/dL (ref 8–23)
CO2: 32 mmol/L (ref 22–32)
Calcium: 8.7 mg/dL — ABNORMAL LOW (ref 8.9–10.3)
Chloride: 92 mmol/L — ABNORMAL LOW (ref 98–111)
Creatinine, Ser: 1.14 mg/dL (ref 0.61–1.24)
GFR, Estimated: >60 mL/min (ref >60)
Glucose, Bld: 145 mg/dL — ABNORMAL HIGH (ref 70–99)
Potassium: 2.9 mmol/L — ABNORMAL LOW (ref 3.5–5.1)
Sodium: 135 mmol/L (ref 135–145)
Total Bilirubin: 0.3 mg/dL (ref 0.3–1.2)
Total Protein: 7.7 g/dL (ref 6.5–8.1)

## 2022-01-19 LAB — TROPONIN I (HIGH SENSITIVITY)
Troponin I (High Sensitivity): 39 ng/L — ABNORMAL HIGH (ref <18)
Troponin I (High Sensitivity): 35 ng/L — ABNORMAL HIGH (ref <18)

## 2022-01-19 LAB — BRAIN NATRIURETIC PEPTIDE: B Natriuretic Peptide: 524.2 pg/mL — ABNORMAL HIGH (ref 0.0–100.0)

## 2022-01-19 LAB — GLUCOSE, CAPILLARY
Glucose-Capillary: 93 mg/dL (ref 70–99)
Glucose-Capillary: 114 mg/dL — ABNORMAL HIGH (ref 70–99)

## 2022-01-19 LAB — RESP PANEL BY RT-PCR (FLU A&B, COVID) ARPGX2
Influenza A by PCR: NEGATIVE
Influenza B by PCR: NEGATIVE
SARS Coronavirus 2 by RT PCR: NEGATIVE

## 2022-01-19 LAB — MAGNESIUM: Magnesium: 1.5 mg/dL — ABNORMAL LOW (ref 1.7–2.4)

## 2022-01-19 MED ORDER — FAMOTIDINE 20 MG PO TABS
20.0000 mg | ORAL_TABLET | Freq: Two times a day (BID) | ORAL | Status: DC
  Administered 2022-01-19 – 2022-01-21 (×4): 20 mg via ORAL
  Filled 2022-01-19 (×4): qty 1

## 2022-01-19 MED ORDER — METHOCARBAMOL 500 MG PO TABS: 500.0000 mg | ORAL_TABLET | Freq: Four times a day (QID) | ORAL | Status: DC | PRN

## 2022-01-19 MED ORDER — HYDROCHLOROTHIAZIDE 12.5 MG PO TABS: 25.0000 mg | ORAL_TABLET | Freq: Every day | ORAL | Status: DC

## 2022-01-19 MED ORDER — MAGNESIUM SULFATE 2 GM/50ML IV SOLN
2.0000 g | Freq: Once | INTRAVENOUS | Status: AC
  Administered 2022-01-19: 2 g via INTRAVENOUS
  Filled 2022-01-19: qty 50

## 2022-01-19 MED ORDER — METOPROLOL SUCCINATE ER 50 MG PO TB24
100.0000 mg | ORAL_TABLET | Freq: Two times a day (BID) | ORAL | Status: DC
  Administered 2022-01-19 – 2022-01-21 (×4): 100 mg via ORAL
  Filled 2022-01-19 (×4): qty 2

## 2022-01-19 MED ORDER — DILTIAZEM HCL ER COATED BEADS 120 MG PO CP24
120.0000 mg | ORAL_CAPSULE | Freq: Two times a day (BID) | ORAL | Status: DC
  Administered 2022-01-19 – 2022-01-21 (×4): 120 mg via ORAL
  Filled 2022-01-19 (×4): qty 1

## 2022-01-19 MED ORDER — ACETAMINOPHEN 650 MG RE SUPP: 650.0000 mg | Freq: Four times a day (QID) | RECTAL | Status: DC | PRN

## 2022-01-19 MED ORDER — POTASSIUM CHLORIDE CRYS ER 20 MEQ PO TBCR
40.0000 meq | EXTENDED_RELEASE_TABLET | ORAL | Status: AC
  Administered 2022-01-19 – 2022-01-20 (×3): 40 meq via ORAL
  Filled 2022-01-19 (×3): qty 2

## 2022-01-19 MED ORDER — NITROGLYCERIN 0.4 MG SL SUBL: 0.4000 mg | SUBLINGUAL_TABLET | SUBLINGUAL | Status: DC | PRN

## 2022-01-19 MED ORDER — ACETAMINOPHEN 325 MG PO TABS
650.0000 mg | ORAL_TABLET | Freq: Four times a day (QID) | ORAL | Status: DC | PRN
  Administered 2022-01-20: 650 mg via ORAL
  Filled 2022-01-19: qty 2

## 2022-01-19 MED ORDER — DILTIAZEM LOAD VIA INFUSION
20.0000 mg | Freq: Once | INTRAVENOUS | Status: AC
  Administered 2022-01-19: 20 mg via INTRAVENOUS
  Filled 2022-01-19: qty 20

## 2022-01-19 MED ORDER — DILTIAZEM HCL-DEXTROSE 125-5 MG/125ML-% IV SOLN (PREMIX)
5.0000 mg/h | INTRAVENOUS | Status: DC
  Administered 2022-01-19: 15 mg/h via INTRAVENOUS
  Administered 2022-01-19: 5 mg/h via INTRAVENOUS
  Filled 2022-01-19 (×3): qty 125

## 2022-01-19 MED ORDER — SODIUM CHLORIDE 0.9 % IV BOLUS
500.0000 mL | Freq: Once | INTRAVENOUS | Status: AC
Start: 2022-01-19 — End: 2022-01-19
  Administered 2022-01-19: 500 mL via INTRAVENOUS

## 2022-01-19 MED ORDER — TAMSULOSIN HCL 0.4 MG PO CAPS
0.4000 mg | ORAL_CAPSULE | Freq: Every day | ORAL | Status: DC
  Administered 2022-01-19 – 2022-01-20 (×2): 0.4 mg via ORAL
  Filled 2022-01-19 (×2): qty 1

## 2022-01-19 MED ORDER — INSULIN ASPART 100 UNIT/ML IJ SOLN
0.0000 [IU] | Freq: Three times a day (TID) | INTRAMUSCULAR | Status: DC
  Filled 2022-01-19: qty 0.15

## 2022-01-19 MED ORDER — LEVALBUTEROL HCL 0.63 MG/3ML IN NEBU: 0.6300 mg | INHALATION_SOLUTION | Freq: Four times a day (QID) | RESPIRATORY_TRACT | Status: DC | PRN

## 2022-01-19 MED ORDER — TRAMADOL HCL 50 MG PO TABS: 50.0000 mg | ORAL_TABLET | Freq: Four times a day (QID) | ORAL | Status: DC | PRN

## 2022-01-19 MED ORDER — GABAPENTIN 300 MG PO CAPS
600.0000 mg | ORAL_CAPSULE | Freq: Two times a day (BID) | ORAL | Status: DC
  Administered 2022-01-19 – 2022-01-21 (×4): 600 mg via ORAL
  Filled 2022-01-19 (×4): qty 2

## 2022-01-19 MED ORDER — ONDANSETRON HCL 4 MG PO TABS: 4.0000 mg | ORAL_TABLET | Freq: Four times a day (QID) | ORAL | Status: DC | PRN

## 2022-01-19 MED ORDER — METOPROLOL TARTRATE 5 MG/5ML IV SOLN
5.0000 mg | Freq: Once | INTRAVENOUS | Status: DC
Start: 2022-01-19 — End: 2022-01-19

## 2022-01-19 MED ORDER — POTASSIUM CHLORIDE 10 MEQ/100ML IV SOLN
INTRAVENOUS | Status: AC
  Filled 2022-01-19: qty 100

## 2022-01-19 MED ORDER — ONDANSETRON HCL 4 MG/2ML IJ SOLN: 4.0000 mg | Freq: Four times a day (QID) | INTRAMUSCULAR | Status: DC | PRN

## 2022-01-19 MED ORDER — ALUM & MAG HYDROXIDE-SIMETH 200-200-20 MG/5ML PO SUSP
30.0000 mL | ORAL | Status: DC | PRN
  Administered 2022-01-19 – 2022-01-20 (×2): 30 mL via ORAL
  Filled 2022-01-19 (×3): qty 30

## 2022-01-19 MED ORDER — MAGNESIUM OXIDE -MG SUPPLEMENT 400 (240 MG) MG PO TABS
200.0000 mg | ORAL_TABLET | Freq: Two times a day (BID) | ORAL | Status: DC
  Administered 2022-01-20 – 2022-01-21 (×3): 200 mg via ORAL
  Filled 2022-01-19 (×3): qty 1

## 2022-01-19 MED ORDER — DABIGATRAN ETEXILATE MESYLATE 150 MG PO CAPS
150.0000 mg | ORAL_CAPSULE | Freq: Two times a day (BID) | ORAL | Status: DC
  Administered 2022-01-19 – 2022-01-21 (×4): 150 mg via ORAL
  Filled 2022-01-19 (×4): qty 1

## 2022-01-19 MED ORDER — PRIMIDONE 50 MG PO TABS
150.0000 mg | ORAL_TABLET | Freq: Two times a day (BID) | ORAL | Status: DC
  Administered 2022-01-19 – 2022-01-21 (×4): 150 mg via ORAL
  Filled 2022-01-19 (×4): qty 3

## 2022-01-19 MED ORDER — POTASSIUM CHLORIDE 10 MEQ/100ML IV SOLN
10.0000 meq | INTRAVENOUS | Status: AC
  Administered 2022-01-19 (×2): 10 meq via INTRAVENOUS
  Filled 2022-01-19 (×2): qty 100

## 2022-01-19 MED ORDER — POTASSIUM CHLORIDE CRYS ER 20 MEQ PO TBCR
20.0000 meq | EXTENDED_RELEASE_TABLET | Freq: Every day | ORAL | Status: DC
  Administered 2022-01-20 – 2022-01-21 (×2): 20 meq via ORAL
  Filled 2022-01-19 (×2): qty 1

## 2022-01-19 MED ORDER — LEVALBUTEROL HCL 1.25 MG/0.5ML IN NEBU
1.2500 mg | INHALATION_SOLUTION | Freq: Four times a day (QID) | RESPIRATORY_TRACT | Status: DC
  Administered 2022-01-19: 1.25 mg via RESPIRATORY_TRACT
  Filled 2022-01-19: qty 0.5

## 2022-01-19 MED ORDER — METOPROLOL TARTRATE 5 MG/5ML IV SOLN
5.0000 mg | Freq: Once | INTRAVENOUS | Status: AC
  Administered 2022-01-19: 5 mg via INTRAVENOUS
  Filled 2022-01-19: qty 5

## 2022-01-19 NOTE — ED Notes (Signed)
Unsuccessful IV attempt in L Forearm, 22g. IV Team at bedside. ?

## 2022-01-19 NOTE — ED Notes (Signed)
Dr Ortiz at bedside 

## 2022-01-19 NOTE — ED Notes (Signed)
Dr. Tyrone Nine aware of trending HR and max Cardizem dose. ?

## 2022-01-19 NOTE — H&P (Signed)
History and Physical    Patient: Bryan [name] QZR:[id number] DOB: February 20, 1946 DOA: 01/19/2022 DOS: the patient was seen and examined on 01/19/2022 PCP: [name], FNP  Patient coming from: Home  Chief Complaint:  Chief Complaint  Patient presents with   Shortness of Breath   rib cage pain   HPI: Bryan [name] is a 76 y.o. male with medical history significant of anemia, osteoarthritis of multiple sites, BPH, type 2 diabetes, essential tremor, GERD, impaired hearing, hypertension, RBBB, rheumatoid arthritis, childhood seizures, sleep apnea on CPAP who is coming to the emergency department due to left chest wall pain and dyspnea since early in the morning.  He stated he has been very fatigue since earlier this week.  He was seen recently at [facility] and started on Pradaxa due to bilateral pulmonary embolism. He has had wheezing and productive cough of unknown colored sputum as wife stated that he has been swallowing expectorated phlegm, but no fever, chills or night sweats.  No precordial chest pain, but positive palpitations, orthopnea and lower extremity edema.  His appetite has been decreased.  No abdominal pain, nausea, emesis, diarrhea, melena or hematochezia.  He gets constipated on occasion.  No flank pain, dysuria, frequency or hematuria.  No polyuria, polydipsia, polyphagia or blurred vision.  He had not taken his oral diltiazem and metoprolol this morning.  ED course: Initial vital signs were temperature 98.6 F, pulse 142, respirations 16, BP 126/86 mmHg O2 sat 95%On room air.  Magnesium sulfate 2 g IVPB, KCl 10 mEq IVPB x2 and a Cardizem infusion were ordered in the ED. I ordered metoprolol 5 mg IVP x1, levalbuterol neb and oral potassium supplementation.  Lab work:  His urinalysis shows small hemoglobinuria and proteinuria 30 mg/dL.  Coronavirus and influenza PCR  Was negative.  CBC is her white count 9.6 with 84% neutrophils, hemoglobin 10.7 g/dL and platelets 330.  Troponin was  39 and then 35 ng/L.  BNP 524.2 pg milliliter.  CMP showed a potassium of 2.9 and chloride 92 mmol/L. glucose 145 mg/dL.  All other electrolytes and renal function, hepatic function except for albumin level were normal when calcium was corrected  Imaging: Portable 1 view chest radiograph showed mild cardiomegaly and mild edema suggesting mild CHF.  Review of Systems: As mentioned in the history of present illness. All other systems reviewed and are negative. Past Medical History:  Diagnosis Date   Anemia    Arthritis    hands, knees, cervical area. Back pain. Rheumatoid arthritis- weekly injections.   BPH (benign prostatic hypertrophy)    Cancer (HCC)    Diabetes (HCC)    Essential tremor    GERD (gastroesophageal reflux disease)    reports for indigestion he uses mustard    Hearing deficit    wears hearing aids bilateral   HTN (hypertension)    Obesity    OSA (obstructive sleep apnea)    Refusal of blood transfusions as patient is Jehovah's Witness    Rheumatoid arthritis ([place])    Right bundle branch block    history of   Seizures ([place])    AS A CHILD. only esential tremors now.   Sleep apnea    cpap - settings at 9 per patient    Ulcer    Past Surgical History:  Procedure Laterality Date   ANTERIOR CERVICAL DECOMP/DISCECTOMY FUSION N/A 03/30/2013   Procedure: ANTERIOR CERVICAL DECOMPRESSION/DISCECTOMY FUSION 2 LEVELS;  Surgeon: Otilio Connors, MD;  Location: [facility];  Service:  Neurosurgery;  Laterality: N/A;  C4-5 C5-6 Anterior cervical decompression/diskectomy/fusion/Allograft/Plate   CHOLECYSTECTOMY N/A 12/07/2013   Procedure: LAPAROSCOPIC CHOLECYSTECTOMY WITH INTRAOPERATIVE CHOLANGIOGRAM;  Surgeon: Adin Hector, MD;  Location: [facility];  Service: General;  Laterality: N/A;   COLONOSCOPY     DECOMPRESSIVE LUMBAR LAMINECTOMY LEVEL 2 N/A 02/15/2015   Procedure: COMPLETE DECOMPRESSIVE LUMBAR LAMINECTOMY L4-L5/ FORAMINOTOMY TO L4 NERVE ROOT AND L5 NERVE ROOT BILATERALLY;   Surgeon: [name], MD;  Location: [facility];  Service: Orthopedics;  Laterality: N/A;   EYE SURGERY     right, growth excision   LUMBAR LAMINECTOMY/DECOMPRESSION MICRODISCECTOMY Left 01/18/2016   Procedure:  DECOMPRESSION L4-L5 MICRODISCECTOMY L4-L5 ON LEFT FOR SPINAL STENOSIS;  Surgeon: [name], MD;  Location: [facility];  Service: Orthopedics;  Laterality: Left;   PAROTIDECTOMY Left 01/24/2018   Procedure: INCISIONAL BIOPSY OF LEFT PAROTID    MASS;  Surgeon: Helayne Seminole, MD;  Location: [facility];  Service: ENT;  Laterality: Left;   SPINE SURGERY     TONSILLECTOMY     VASECTOMY     WRIST GANGLION EXCISION Left    Social History:  reports that he quit smoking about 49 years ago. His smoking use included cigarettes. He has a 7.00 pack-year smoking history. He has never used smokeless tobacco. He reports current alcohol use. He reports that he does not use drugs.  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 Authorizing Provider  dabigatran (PRADAXA) 150 MG CAPS capsule Take 150 mg by mouth 2 (two) times daily. 12/28/21  Yes [provider]  diltiazem (TIAZAC) 120 MG 24 hr capsule Take 120 mg by mouth 2 (two) times daily. 12/28/21  Yes [provider]  famotidine (PEPCID) 20 MG tablet Take 1 tablet (20 mg total) by mouth 2 (two) times daily. 01/13/22  Yes Prosperi, Christian H, PA-C  gabapentin (NEURONTIN) 600 MG tablet Take 600 mg by mouth 2 (two) times daily. 12/28/21  Yes [provider]  hydrochlorothiazide (HYDRODIURIL) 25 MG tablet Take 1 tablet (25 mg total) by mouth daily. 12/24/19  Yes Stallings, Zoe A, MD  LACTOBACILLUS PO Take 1 tablet by mouth in the morning and at bedtime. 12/28/21  Yes [provider]  methocarbamol (ROBAXIN) 500 MG tablet Take 1 tablet (500 mg total) by mouth 4 (four) times daily. Patient taking differently: Take 500 mg by mouth in the morning and at bedtime. 10/20/21  Yes Meyran, Ocie Cornfield, NP  metoprolol succinate (TOPROL-XL) 100 MG 24 hr tablet TAKE 1 TABLET BY MOUTH ONCE DAILY  WITH  OR  IMMEDIATELY  FOLLOWING  A  MEAL Patient taking differently: 100 mg 2 (two) times daily. 05/10/20  Yes Patwardhan, Manish J, MD  nitroGLYCERIN (NITROSTAT) 0.4 MG SL tablet Place 0.4 mg under the tongue every 5 (five) minutes as needed for chest pain.   Yes [provider]  primidone (MYSOLINE) 50 MG tablet Take 150 mg by mouth in the morning and at bedtime.   Yes [provider]  sucralfate (CARAFATE) 1 g tablet Take 1 tablet (1 g total) by mouth 3 (three) times daily as needed. Patient taking differently: Take 1 g by mouth 3 (three) times daily as needed (stomach upset). 01/13/22  Yes Prosperi, Christian H, PA-C  Tamsulosin HCl (FLOMAX) 0.4 MG CAPS Take 0.4 mg by mouth at bedtime.   Yes [provider]  traMADol (ULTRAM) 50 MG tablet Take 50 mg by mouth every 6 (six) hours as needed for pain. 12/29/21  Yes [provider]  traMADol-acetaminophen (ULTRACET) 37.5-325 MG tablet Take 1 tablet by mouth at bedtime. 11/20/21  Yes [provider]  Cyanocobalamin (B-12) 1000 MCG SUBL Place 2,000 mcg under the tongue daily. Patient not taking: Reported on 01/19/2022  10/04/21   [name], MD  iron polysaccharides (NIFEREX) 150 MG capsule Take 1 capsule (150 mg total) by mouth daily. Patient not taking: Reported on 01/19/2022 10/31/21   [name], MD  oxyCODONE-acetaminophen (PERCOCET) 5-325 MG tablet Take 1 tablet by mouth every 4 (four) hours as needed for severe pain. Patient not taking: Reported on 01/19/2022 10/20/21 10/20/22  Eleonore Chiquito, NP  rosuvastatin (CRESTOR) 20 MG tablet Take 1 tablet (20 mg total) by mouth daily. Patient not taking: Reported on 10/03/2021 09/29/21 01/27/22  Alethia Berthold, PA-C    Physical Exam: Vitals:   01/19/22 1449 01/19/22 1450 01/19/22 1451 01/19/22 1515  BP:    135/71  Pulse: 81 81 80 95  Resp: 20 (!) 22 19 (!) 24  Temp:    99.2 F (37.3 C)  TempSrc:    Oral  SpO2: 100% 100% 100% 100%  Weight:      Height:       Physical Exam Vitals and nursing note reviewed.  Constitutional:      Appearance: He is well-developed. He is obese.  HENT:     Head: Normocephalic and atraumatic.     Mouth/Throat:     Mouth: Mucous membranes are moist.  Eyes:     Pupils: Pupils are equal, round, and reactive to light.  Neck:     Vascular: No JVD.  Cardiovascular:     Rate and Rhythm: Tachycardia present. Rhythm irregularly irregular.     Heart sounds: S1 normal and S2 normal.  Pulmonary:     Effort: Tachypnea present.     Breath sounds: Examination of the right-lower field reveals decreased breath sounds. Examination of the left-lower field reveals decreased breath sounds. Decreased breath sounds, wheezing and rhonchi present.  Abdominal:     General: Bowel sounds are normal. There is no distension.     Palpations: Abdomen is soft.     Tenderness: There is no abdominal tenderness.  Musculoskeletal:     Cervical back: Neck supple.     Right lower leg: 2+ Pitting Edema present.     Left lower leg: 2+ Pitting Edema present.  Skin:    General: Skin is warm and dry.  Neurological:      General: No focal deficit present.     Mental  Status: He is alert and oriented to person, place, and time.  Psychiatric:        Mood and Affect: Mood normal.        Behavior: Behavior normal.    Data Reviewed:  There are no new results to review at this time.  10/11/2021 echocardiogram IMPRESSIONS    1. Left ventricular ejection fraction, by estimation, is 55 to 60%. The  left ventricle has normal function. The left ventricle has no regional  wall motion abnormalities. Left ventricular diastolic parameters are  indeterminate. There is the  interventricular septum is flattened in diastole ('D' shaped left  ventricle), consistent with right ventricular volume overload.   2. Right ventricular systolic function is normal. The right ventricular  size is normal. There is moderately elevated pulmonary artery systolic  pressure.   3. The mitral valve is normal in structure. No evidence of mitral valve  regurgitation. No evidence of mitral stenosis.   4. Tricuspid valve regurgitation is moderate.   5. The aortic valve is normal in structure. There is mild thickening of  the aortic valve. Aortic valve regurgitation is mild. No aortic stenosis  is present.   6. The inferior vena cava is normal in size with greater than 50%  respiratory variability, suggesting right atrial pressure of 3 mmHg.   Out of network echocardiogram January/2023.  Impression  Left Ventricle: Doppler parameters are indeterminate for diastolic  function.    Left Ventricle: Wall motion is normal.    Left Ventricle: Systolic function is normal. EF: 60-65%.    Pulmonic Valve: Mild to moderate regurgitation.  Mean PA pressure based  on PA jet 28 mmHg.  End-diastolic RV systolic pressure about 17 mmHg.    Aortic Valve: Trace aortic valve regurgitation.    Tricuspid Valve: The right ventricular systolic pressure is moderately  elevated - about 51 mmHg.   EKG #1: Vent. rate 163 BPM PR interval * ms QRS duration  128 ms QT/QTcB 300/494 ms P-R-T axes 0 243 21 flutter with 2:1 block RBBB and LAFB  EKG #2: Vent. rate 163 BPM PR interval * ms QRS duration 128 ms QT/QTcB 300/494 ms P-R-T axes 0 243 21 Wide-QRS tachycardia RBBB and LAFB  Monitor tracing later seen with atrial fibrillation with RVR. Assessment and Plan: Principal Problem:   Atrial fibrillation with RVR (HCC) CHA?DS?-VASc Score of 6. Observation/PCU. Continue supplemental oxygen. Continue diltiazem infusion. Continue Pradaxa twice daily. Resume oral metoprolol and Cardizem. Keep electrolytes optimized. Cardiology will see in the morning.  Active Problems:   Elevated troponin No significant uptrend. Secondary to demand ischemia.    Coronary artery disease Continue Pradaxa and metoprolol.     Essential hypertension Continue metoprolol succinate 100 mg p.o. twice daily. Continue diltiazem 120 mg p.o. twice daily. Monitor blood pressure and heart rate.    Asthmatic bronchitis Xopenex nebs 4 times a day.    BPH (benign prostatic hyperplasia) Continue tamsulosin 0.4 mg p.o. daily.    Essential tremor Continue primidone 150 mg p.o. twice daily.    Obesity (BMI 30-39.9) Lifestyle modifications. Follow-up with PCP.      Advance Care Planning:   Code Status: Full Code   Consults: Cardiology will see in the morning Vernell Leep, MD)  Family Communication: His wife was at bedside.  Severity of Illness: The appropriate patient status for this patient is OBSERVATION. Observation status is judged to be reasonable and necessary in order to provide the required intensity of service to ensure the patient's safety. The patient's  presenting symptoms, physical exam findings, and initial radiographic and laboratory data in the context of their medical condition is felt to place them at decreased risk for further clinical deterioration. Furthermore, it is anticipated that the patient will be medically stable for  discharge from the hospital within 2 midnights of admission.   Author: Reubin Milan, MD 01/19/2022 3:27 PM  For on call review [email or web address].   This document was prepared using Dragon voice recognition software and may contain some unintended transcription errors.

## 2022-01-19 NOTE — Progress Notes (Signed)
MD sent secure chat regarding Cardizem ordered PO and Pt is on a Cardizem Drip. Per MD ok to give oral Cardizem and titrate the Cardizem down .  ?

## 2022-01-19 NOTE — ED Triage Notes (Signed)
Patient c/o left rib cage pain and SOB since this AM. ?

## 2022-01-19 NOTE — ED Notes (Addendum)
Unsuccessful IV attempt x2. IV team consult placed. ?

## 2022-01-19 NOTE — ED Notes (Signed)
Patient aware urine sample is needed and provided with urinal. ?

## 2022-01-19 NOTE — ED Notes (Signed)
IV team at bedside 

## 2022-01-19 NOTE — ED Provider Notes (Signed)
[facility] DEPT Provider Note   CSN: [id number] Arrival date & time: 01/19/22  6644     History  Chief Complaint  Patient presents with   Shortness of Breath   rib cage pain    Bryan [name] is a 76 y.o. male.  76 yo M with a chief complaints of not feeling well.  Going on for the past week.  He had a visit at his house yesterday and was noted to be very fatigued and unable to walk even short distances and so was encouraged to come to the hospital yesterday he refused until this morning.  He says that he has been coughing up some junk for the past week.  No fevers or chills.   Shortness of Breath     Home Medications Prior to Admission medications   Medication Sig Start Date End Date Taking? Authorizing Provider  atorvastatin (LIPITOR) 20 MG tablet Take 20 mg by mouth at bedtime.    [provider]  Cyanocobalamin (B-12) 1000 MCG SUBL Place 2,000 mcg under the tongue daily. 10/04/21   [name], MD  famotidine (PEPCID) 20 MG tablet Take 1 tablet (20 mg total) by mouth 2 (two) times daily. 01/13/22   Prosperi, Christian H, PA-C  gabapentin (NEURONTIN) 600 MG tablet Take 600 mg by mouth 2 (two) times daily. 01/05/20   [provider]  hydrochlorothiazide (HYDRODIURIL) 25 MG tablet Take 1 tablet (25 mg total) by mouth daily. 12/24/19   [name], MD  iron polysaccharides (NIFEREX) 150 MG capsule Take 1 capsule (150 mg total) by mouth daily. 10/31/21   [name], MD  methocarbamol (ROBAXIN) 500 MG tablet Take 1 tablet (500 mg total) by mouth 4 (four) times daily. 10/20/21   Meyran, Ocie Cornfield, NP  methocarbamol (ROBAXIN) 750 MG tablet Take 750 mg by mouth daily.    [provider]  metoprolol succinate (TOPROL-XL) 100 MG 24 hr tablet TAKE 1 TABLET BY MOUTH ONCE DAILY WITH  OR  IMMEDIATELY  FOLLOWING  A  MEAL Patient taking differently: 100 mg 2 (two) times daily. 05/10/20   Patwardhan, Reynold Bowen, MD   nitroGLYCERIN (NITROSTAT) 0.4 MG SL tablet Place 0.4 mg under the tongue every 5 (five) minutes as needed for chest pain.    [provider]  oxyCODONE-acetaminophen (PERCOCET) 5-325 MG tablet Take 1 tablet by mouth every 4 (four) hours as needed for severe pain. 10/20/21 10/20/22  Meyran, Ocie Cornfield, NP  primidone (MYSOLINE) 50 MG tablet Take 150 mg by mouth in the morning and at bedtime.    [provider]  rosuvastatin (CRESTOR) 20 MG tablet Take 1 tablet (20 mg total) by mouth daily. Patient not taking: Reported on 10/03/2021 09/29/21 01/27/22  Cantwell, Anderson Malta C, PA-C  sucralfate (CARAFATE) 1 g tablet Take 1 tablet (1 g total) by mouth 3 (three) times daily as needed. 01/13/22   Prosperi, Christian H, PA-C  Tamsulosin HCl (FLOMAX) 0.4 MG CAPS Take 0.4 mg by mouth at bedtime.    [provider]  traMADol-acetaminophen (ULTRACET) 37.5-325 MG tablet Take 1 tablet by mouth in the morning and at bedtime.    [provider]      Allergies    Adalimumab, Ampicillin, Terazosin, Lisinopril, and Other    Review of Systems   Review of Systems  Respiratory:  Positive for shortness of breath.    Physical Exam Updated Vital Signs BP 126/86    Temp 98.6 F (37 C) (Oral)  Resp 16    Ht 5' 5.5" (1.664 m)    Wt 101 kg    BMI 36.49 kg/m  Physical Exam Vitals and nursing note reviewed.  Constitutional:      Appearance: He is well-developed.  HENT:     Head: Normocephalic and atraumatic.  Eyes:     Pupils: Pupils are equal, round, and reactive to light.  Neck:     Vascular: No JVD.  Cardiovascular:     Rate and Rhythm: Regular rhythm. Tachycardia present.     Heart sounds: No murmur heard.   No friction rub. No gallop.  Pulmonary:     Effort: No respiratory distress.     Breath sounds: No wheezing.  Abdominal:     General: There is no distension.     Tenderness: There is no abdominal tenderness. There is no guarding or rebound.  Musculoskeletal:         General: Normal range of motion.     Cervical back: Normal range of motion and neck supple.     Right lower leg: Edema present.     Left lower leg: Edema present.     Comments: 3+ edema to the thighs bilaterally  Skin:    Coloration: Skin is not pale.     Findings: No rash.  Neurological:     Mental Status: He is alert and oriented to person, place, and time.  Psychiatric:        Behavior: Behavior normal.    ED Results / Procedures / Treatments   Labs (all labs ordered are listed, but only abnormal results are displayed) Labs Reviewed  RESP PANEL BY RT-PCR (FLU A&B, COVID) ARPGX2  CBC WITH DIFFERENTIAL/PLATELET  COMPREHENSIVE METABOLIC PANEL  BRAIN NATRIURETIC PEPTIDE  URINALYSIS, ROUTINE W REFLEX MICROSCOPIC  MAGNESIUM  TROPONIN I (HIGH SENSITIVITY)    EKG None  Radiology No results found.  Procedures Procedures    Medications Ordered in ED Medications  sodium chloride 0.9 % bolus 500 mL (has no administration in time range)  diltiazem (CARDIZEM) 1 mg/mL load via infusion 20 mg (has no administration in time range)    And  diltiazem (CARDIZEM) 125 mg in dextrose 5% 125 mL (1 mg/mL) infusion (has no administration in time range)    ED Course/ Medical Decision Making/ A&P                           Medical Decision Making Amount and/or Complexity of Data Reviewed Labs: ordered. Radiology: ordered.  Risk Prescription drug management. Decision regarding hospitalization.   76 yo M with a chief complaints of not feeling well.  This been going on for the past week.  Was seen by his provider at his house yesterday and thought to be really fatigued and recommended coming to the emergency department.  The patient was able to present this morning and was found to be likely in a flutter with 2 1 block.  Had a recent admission at a [facility] facility for the same.  At that visit was found to have bilateral pulmonary embolus.  We will obtain a laboratory evaluation  small bolus of IV fluids chest x-ray diltiazem bolus and infusion reassess.  Patient's heart rate is a bit more regular now much more consistent with atrial flutter versus fib with variable block.  Now ranging mostly in the 120s to 140s.  Patient is feeling quite a bit better.  With persistent tachycardia will discuss with medicine for admission.  Troponin is very minimally elevated I suspect likely demand, patient's chest x-ray independently interpreted by me with increased edema.  Could be high-output heart failure based on having symptoms for a week.  Anemia appears higher than prior checks.  Potassium of 2.9 will replenish.  Mag low.  Patient does state compliance with his Pradaxa.  CRITICAL CARE Performed by: Cecilio Asper   Total critical care time: 35 minutes  Critical care time was exclusive of separately billable procedures and treating other patients.  Critical care was necessary to treat or prevent imminent or life-threatening deterioration.  Critical care was time spent personally by me on the following activities: development of treatment plan with patient and/or surrogate as well as nursing, discussions with consultants, evaluation of patient's response to treatment, examination of patient, obtaining history from patient or surrogate, ordering and performing treatments and interventions, ordering and review of laboratory studies, ordering and review of radiographic studies, pulse oximetry and re-evaluation of patient's condition.  The patients results and plan were reviewed and discussed.   Any x-rays performed were independently reviewed by myself.   Differential diagnosis were considered with the presenting HPI.  Medications  diltiazem (CARDIZEM) 1 mg/mL load via infusion 20 mg (20 mg Intravenous Bolus from Bag 01/19/22 1042)    And  diltiazem (CARDIZEM) 125 mg in dextrose 5% 125 mL (1 mg/mL) infusion (15 mg/hr Intravenous Rate/Dose Change 01/19/22 1154)  potassium  chloride 10 mEq in 100 mL IVPB (has no administration in time range)  magnesium sulfate IVPB 2 g 50 mL (has no administration in time range)  acetaminophen (TYLENOL) tablet 650 mg (has no administration in time range)    Or  acetaminophen (TYLENOL) suppository 650 mg (has no administration in time range)  ondansetron (ZOFRAN) tablet 4 mg (has no administration in time range)    Or  ondansetron (ZOFRAN) injection 4 mg (has no administration in time range)  insulin aspart (novoLOG) injection 0-15 Units (has no administration in time range)  sodium chloride 0.9 % bolus 500 mL (0 mLs Intravenous Stopped 01/19/22 1120)    Vitals:   01/19/22 1230 01/19/22 1305 01/19/22 1306 01/19/22 1308  BP: (!) 140/91     Pulse:      Resp:  (!) 31 (!) 28 (!) 31  Temp:      TempSrc:      SpO2:      Weight:      Height:        Final diagnoses:  Atrial fibrillation with RVR (HCC)  Spondylolisthesis at L4-L5 level    Admission/ observation were discussed with the admitting physician, patient and/or family and they are comfortable with the plan.         Final Clinical Impression(s) / ED Diagnoses Final diagnoses:  None    Rx / DC Orders ED Discharge Orders     None         [name], DO 01/19/22 1316

## 2022-01-20 ENCOUNTER — Observation Stay (HOSPITAL_COMMUNITY): Payer: No Typology Code available for payment source

## 2022-01-20 DIAGNOSIS — G4733 Obstructive sleep apnea (adult) (pediatric): Secondary | ICD-10-CM

## 2022-01-20 DIAGNOSIS — I4891 Unspecified atrial fibrillation: Secondary | ICD-10-CM | POA: Diagnosis not present

## 2022-01-20 DIAGNOSIS — I5033 Acute on chronic diastolic (congestive) heart failure: Secondary | ICD-10-CM | POA: Diagnosis present

## 2022-01-20 DIAGNOSIS — Z9989 Dependence on other enabling machines and devices: Secondary | ICD-10-CM

## 2022-01-20 DIAGNOSIS — Z86711 Personal history of pulmonary embolism: Secondary | ICD-10-CM | POA: Diagnosis present

## 2022-01-20 LAB — GLUCOSE, CAPILLARY
Glucose-Capillary: 102 mg/dL — ABNORMAL HIGH (ref 70–99)
Glucose-Capillary: 81 mg/dL (ref 70–99)
Glucose-Capillary: 78 mg/dL (ref 70–99)
Glucose-Capillary: 98 mg/dL (ref 70–99)

## 2022-01-20 LAB — BASIC METABOLIC PANEL
Anion gap: 8 (ref 5–15)
BUN: 16 mg/dL (ref 8–23)
CO2: 31 mmol/L (ref 22–32)
Calcium: 8.6 mg/dL — ABNORMAL LOW (ref 8.9–10.3)
Chloride: 99 mmol/L (ref 98–111)
Creatinine, Ser: 1.16 mg/dL (ref 0.61–1.24)
GFR, Estimated: >60 mL/min (ref >60)
Glucose, Bld: 124 mg/dL — ABNORMAL HIGH (ref 70–99)
Potassium: 4.4 mmol/L (ref 3.5–5.1)
Sodium: 138 mmol/L (ref 135–145)

## 2022-01-20 LAB — HEMOGLOBIN A1C
Hgb A1c MFr Bld: 5.8 % — ABNORMAL HIGH (ref 4.8–5.6)
Mean Plasma Glucose: 120 mg/dL

## 2022-01-20 LAB — ECHOCARDIOGRAM LIMITED
Calc EF: 56.6 %
Height: 65.5 in
S' Lateral: 3.4 cm
Single Plane A2C EF: 52.5 %
Single Plane A4C EF: 60.5 %
Weight: 3562.63 oz

## 2022-01-20 LAB — MAGNESIUM: Magnesium: 1.8 mg/dL (ref 1.7–2.4)

## 2022-01-20 MED ORDER — FUROSEMIDE 10 MG/ML IJ SOLN
40.0000 mg | Freq: Once | INTRAMUSCULAR | Status: AC
  Administered 2022-01-20: 40 mg via INTRAVENOUS
  Filled 2022-01-20: qty 4

## 2022-01-20 MED ORDER — LEVALBUTEROL HCL 1.25 MG/0.5ML IN NEBU
1.2500 mg | INHALATION_SOLUTION | Freq: Three times a day (TID) | RESPIRATORY_TRACT | Status: DC
  Administered 2022-01-20 – 2022-01-21 (×4): 1.25 mg via RESPIRATORY_TRACT
  Filled 2022-01-20 (×4): qty 0.5

## 2022-01-20 MED ORDER — CHLORHEXIDINE GLUCONATE 0.12 % MT SOLN
15.0000 mL | Freq: Two times a day (BID) | OROMUCOSAL | Status: DC
  Administered 2022-01-20 – 2022-01-21 (×3): 15 mL via OROMUCOSAL
  Filled 2022-01-20 (×3): qty 15

## 2022-01-20 MED ORDER — PERFLUTREN LIPID MICROSPHERE
1.0000 mL | INTRAVENOUS | Status: AC | PRN
  Administered 2022-01-20: 2 mL via INTRAVENOUS
  Filled 2022-01-20: qty 10

## 2022-01-20 MED ORDER — ORAL CARE MOUTH RINSE
15.0000 mL | Freq: Two times a day (BID) | OROMUCOSAL | Status: DC
  Administered 2022-01-20 (×2): 15 mL via OROMUCOSAL

## 2022-01-20 MED ORDER — FUROSEMIDE 10 MG/ML IJ SOLN
40.0000 mg | Freq: Every day | INTRAMUSCULAR | Status: DC
  Filled 2022-01-20: qty 4

## 2022-01-20 NOTE — Hospital Course (Signed)
Past medical history of type II DM, obesity, OSA, HTN, A-fib, Jehovah's Witness, recent DVT PE.  Presented with complaints of shortness of breath found to have A-fib with RVR again. ?[facility] cardiology consulted. ?

## 2022-01-20 NOTE — Assessment & Plan Note (Signed)
Patient reports orthopnea and PND. ?Volume overloaded as well. ?We will treat with IV Lasix and monitor. ?

## 2022-01-20 NOTE — Progress Notes (Signed)
?Progress Note ? ? ?Patient: Bryan [name] ZSW:[id number] DOB: [birth year] DOA: 01/19/2022     Hospitalization day: 0 ?DOS: the patient was seen and examined on 01/20/2022 ? ?Brief hospital course: ?Past medical history of type II DM, obesity, OSA, HTN, A-fib, Jehovah's Witness, recent DVT PE.  Presented with complaints of shortness of breath found to have A-fib with RVR again. ?[facility] cardiology consulted. ? ?Assessment and Plan: ?* Atrial fibrillation with RVR ([place]) ?Elevated troponin-demand ischemia. ?Presents with complaints of shortness of breath and rib cage pain.  Found to have A-fib with RVR.  Started on Cardizem infusion. ?Cardiology was consulted. ?Currently appears to be rate controlled. ?Cardiology recommends to continue Toprol-XL as well as Cardizem. ?Echocardiogram ordered.   ?Patient was admitted at [facility] between 1/24 - 2/9 for A- flutter with RVR.  Stress test on 2/7 was negative for inducible ischemia.  EF was 60 to 65% there.  Patient was started on Pradaxa due to interaction of other DOACs with his chronic Mysoline use. ? ?Acute on chronic diastolic CHF (congestive heart failure) ([place]) ?Patient reports orthopnea and PND. ?Volume overloaded as well. ?We will treat with IV Lasix and monitor. ? ?Refusal of blood transfusions as patient is Jehovah's Witness ?Noted. ? ?Obesity (BMI 30-39.9) ?OSA on CPAP. ?Body mass index is 36.49 kg/m?[name]  ?Placing the patient at high risk of poor outcome. ?Continue CPAP nightly. ? ?History of pulmonary embolism ?CT PE protocol in outside hospital in 11/2021 showed acute small segmental pulmonary emboli in the right lower lobe.  Venous Doppler was positive for right lower extremity proximal DVT. ?Patient was started on Pradaxa. ?Referred to hematology outpatient. ? ?Coronary artery disease involving native coronary artery of native heart without angina pectoris ?Recent stress test negative in February 2023. ? ?Essential hypertension ?Blood  pressure stable.  Continue current regimen. ? ?Essential tremor ?Patient is on chronic treatment Mysoline.  Continue. ? ?BPH (benign prostatic hyperplasia) ?Continue Flomax. ? ?Asthmatic bronchitis ?Continue current inhalers and nebulizer therapy.  Suspect wheezing is most likely from cardiac origin. ? ?Subjective: No chest pain or chest tightness but no nausea no vomiting or no fever no chills.  Continues to have shortness of breath.  Some cough as well.  Reports orthopnea and reports frequent waking up in the middle of the night coughing and gasping for air. ?Also reports swelling in the legs. ? ?Physical Exam: ?Vitals:  ? 01/20/22 0827 01/20/22 1000 01/20/22 1045 01/20/22 1350  ?BP:    112/60  ?Pulse: 74   74  ?Resp:  '17 16 14  '$ ?Temp:    98.8 ?F (37.1 ?C)  ?TempSrc:    Oral  ?SpO2:    94%  ?Weight:      ?Height:      ? ?General: Appear in moderate distress; no visible Abnormal Neck Mass Or lumps, Conjunctiva normal ?Cardiovascular: S1 and S2 Present, no Murmur, ?Respiratory: increased respiratory effort, Bilateral Air entry present and  faint Crackles, bilateral expiratory wheezes ?Abdomen: Bowel Sound present, no tenderness ?Extremities: bilateral Pedal edema ?Neurology: alert and oriented to time, place, and person ?Gait not checked due to patient safety concerns  ? ?Data Reviewed: ?I have Reviewed nursing notes, Vitals, and Lab results since pt's last encounter. Pertinent lab results CBC and BMP ?I have ordered test including CBC and BMP ?I have reviewed the last note from cardiology,  ?I have discussed pt's care plan and test results with cardiology.  ? ?Family Communication: None at bedside ? ?Disposition: ?Status is: Observation ? ?  Author: ?Berle Mull, MD ?01/20/2022 6:32 PM ? ?For on call review [email or web address]. ?

## 2022-01-20 NOTE — Assessment & Plan Note (Signed)
-   Continue Flomax 

## 2022-01-20 NOTE — Assessment & Plan Note (Addendum)
Elevated troponin-demand ischemia. ?Presents with complaints of shortness of breath and rib cage pain.  Found to have A-fib with RVR.  Started on Cardizem infusion. ?Cardiology was consulted. ?Currently appears to be rate controlled. ?Cardiology recommends to continue Toprol-XL as well as Cardizem. ?Echocardiogram ordered.   ?Patient was admitted at [facility] between 1/24 - 2/9 for A- flutter with RVR.  Stress test on 2/7 was negative for inducible ischemia.  EF was 60 to 65% there.  Patient was started on Pradaxa due to interaction of other DOACs with his chronic Mysoline use. ?

## 2022-01-20 NOTE — Assessment & Plan Note (Signed)
Noted  

## 2022-01-20 NOTE — Plan of Care (Signed)

## 2022-01-20 NOTE — Assessment & Plan Note (Signed)
Blood pressure stable.  Continue current regimen. 

## 2022-01-20 NOTE — Assessment & Plan Note (Signed)
OSA on CPAP. ?Body mass index is 36.49 kg/m?[name]  ?Placing the patient at high risk of poor outcome. ?Continue CPAP nightly. ?

## 2022-01-20 NOTE — Assessment & Plan Note (Signed)
Patient is on chronic treatment Mysoline.  Continue. ?

## 2022-01-20 NOTE — Assessment & Plan Note (Signed)
Continue current inhalers and nebulizer therapy.  Suspect wheezing is most likely from cardiac origin. ?

## 2022-01-20 NOTE — Assessment & Plan Note (Signed)
Recent stress test negative in February 2023. ?

## 2022-01-20 NOTE — Consult Note (Signed)
CARDIOLOGY CONSULT NOTE  Patient ID: ARSHAD OBERHOLZER MRN: [id number] DOB/AGE: [birth year] 76 y.o.  Admit date: 01/19/2022 Referring Physician: [facility] hospitalist  Reason for Consultation: Atrial flutter  HPI:   76 year old African-American male, [place] witness, with stage IIb marginal zone lymphoma, rheumatoid arthritis, anemia,  CAD with mild ischemia (04/2020), paroxysmal atrial flutter, h/o PE-currently on Pradaxa, pulmonary hypertension.  Patient was admitted to [facility] on 01/19/2022 with complaints of palpitations, leg edema, generalized fatigue.  While at [facility], patient had Lasix brief.  Of atrial flutter with RVR.  Palpitations have since resolved, leg edema persists.    Past Medical History:  Diagnosis Date   Anemia    Arthritis    hands, knees, cervical area. Back pain. Rheumatoid arthritis- weekly injections.   BPH (benign prostatic hypertrophy)    Cancer (HCC)    Diabetes (HCC)    Essential tremor    GERD (gastroesophageal reflux disease)    reports for indigestion he uses mustard    Hearing deficit    wears hearing aids bilateral   HTN (hypertension)    Obesity    OSA (obstructive sleep apnea)    Refusal of blood transfusions as patient is Jehovah's Witness    Rheumatoid arthritis ([place])    Right bundle branch block    history of   Seizures ([place])    AS A CHILD. only esential tremors now.   Sleep apnea    cpap - settings at 9 per patient    Ulcer      Past Surgical History:  Procedure Laterality Date   ANTERIOR CERVICAL DECOMP/DISCECTOMY FUSION N/A 03/30/2013   Procedure: ANTERIOR CERVICAL DECOMPRESSION/DISCECTOMY FUSION 2 LEVELS;  Surgeon: Otilio Connors, MD;  Location: [facility];  Service: Neurosurgery;  Laterality: N/A;  C4-5 C5-6 Anterior cervical decompression/diskectomy/fusion/Allograft/Plate   CHOLECYSTECTOMY N/A 12/07/2013   Procedure: LAPAROSCOPIC CHOLECYSTECTOMY WITH INTRAOPERATIVE CHOLANGIOGRAM;  Surgeon: Adin Hector, MD;   Location: [facility];  Service: General;  Laterality: N/A;   COLONOSCOPY     DECOMPRESSIVE LUMBAR LAMINECTOMY LEVEL 2 N/A 02/15/2015   Procedure: COMPLETE DECOMPRESSIVE LUMBAR LAMINECTOMY L4-L5/ FORAMINOTOMY TO L4 NERVE ROOT AND L5 NERVE ROOT BILATERALLY;  Surgeon: [name], MD;  Location: [facility] ORS;  Service: Orthopedics;  Laterality: N/A;   EYE SURGERY     right, growth excision   LUMBAR LAMINECTOMY/DECOMPRESSION MICRODISCECTOMY Left 01/18/2016   Procedure:  DECOMPRESSION L4-L5 MICRODISCECTOMY L4-L5 ON LEFT FOR SPINAL STENOSIS;  Surgeon: [name], MD;  Location: [facility];  Service: Orthopedics;  Laterality: Left;   PAROTIDECTOMY Left 01/24/2018   Procedure: INCISIONAL BIOPSY OF LEFT PAROTID    MASS;  Surgeon: Helayne Seminole, MD;  Location: [facility] OR;  Service: ENT;  Laterality: Left;   SPINE SURGERY     TONSILLECTOMY     VASECTOMY     WRIST GANGLION EXCISION Left       Family History  Problem Relation Age of Onset   Colon cancer Maternal Grandfather    Kidney disease Brother    Hypertension Brother    Diabetes Brother    Stroke Maternal Grandmother    Diabetes Mother    Hypertension Mother    CAD Father        died of MI at age 62   Hypertension Father    Diabetes Paternal Aunt        x 4 aunts     Social History: Social History   Socioeconomic History   Marital status: Married    Spouse name:  Not on file   Number of children: 2   Years of education: Not on file   Highest education level: Not on file  Occupational History   Occupation: bus driver    Employer: RETIRED  Tobacco Use   Smoking status: Former    Packs/day: 0.50    Years: 14.00    Pack years: 7.00    Types: Cigarettes    Quit date: 12/14/1972    Years since quitting: 49.1   Smokeless tobacco: Never  Vaping Use   Vaping Use: Never used  Substance and Sexual Activity   Alcohol use: Yes    Comment: occasionally   Drug use: No   Sexual activity: Yes    Birth control/protection: None  Other Topics  Concern   Not on file  Social History Narrative   Lives with wife   Social Determinants of Health   Financial Resource Strain: Not on file  Food Insecurity: Not on file  Transportation Needs: Not on file  Physical Activity: Not on file  Stress: Not on file  Social Connections: Not on file  Intimate Partner Violence: Not on file     Medications Prior to Admission  Medication Sig Dispense Refill Last Dose   dabigatran (PRADAXA) 150 MG CAPS capsule Take 150 mg by mouth 2 (two) times daily.   01/18/2022 at 1900   diltiazem (TIAZAC) 120 MG 24 hr capsule Take 120 mg by mouth 2 (two) times daily.   01/18/2022   famotidine (PEPCID) 20 MG tablet Take 1 tablet (20 mg total) by mouth 2 (two) times daily. 60 tablet 0 01/18/2022   gabapentin (NEURONTIN) 600 MG tablet Take 600 mg by mouth 2 (two) times daily.   01/18/2022   hydrochlorothiazide (HYDRODIURIL) 25 MG tablet Take 1 tablet (25 mg total) by mouth daily. 90 tablet 0 01/18/2022   LACTOBACILLUS PO Take 1 tablet by mouth in the morning and at bedtime.   01/18/2022   methocarbamol (ROBAXIN) 500 MG tablet Take 1 tablet (500 mg total) by mouth 4 (four) times daily. (Patient taking differently: Take 500 mg by mouth in the morning and at bedtime.) 45 tablet 0 01/18/2022   metoprolol succinate (TOPROL-XL) 100 MG 24 hr tablet TAKE 1 TABLET BY MOUTH ONCE DAILY WITH  OR  IMMEDIATELY  FOLLOWING  A  MEAL (Patient taking differently: 100 mg 2 (two) times daily.) 90 tablet 2 01/18/2022 at 1900   nitroGLYCERIN (NITROSTAT) 0.4 MG SL tablet Place 0.4 mg under the tongue every 5 (five) minutes as needed for chest pain.   unk   primidone (MYSOLINE) 50 MG tablet Take 150 mg by mouth in the morning and at bedtime.   01/18/2022   sucralfate (CARAFATE) 1 g tablet Take 1 tablet (1 g total) by mouth 3 (three) times daily as needed. (Patient taking differently: Take 1 g by mouth 3 (three) times daily as needed (stomach upset).) 30 tablet 0 01/18/2022   Tamsulosin HCl (FLOMAX) 0.4 MG CAPS  Take 0.4 mg by mouth at bedtime.   01/18/2022   traMADol (ULTRAM) 50 MG tablet Take 50 mg by mouth every 6 (six) hours as needed for pain.   01/18/2022   traMADol-acetaminophen (ULTRACET) 37.5-325 MG tablet Take 1 tablet by mouth at bedtime.   01/18/2022   Cyanocobalamin (B-12) 1000 MCG SUBL Place 2,000 mcg under the tongue daily. (Patient not taking: Reported on 01/19/2022) 60 tablet 5 Not Taking   iron polysaccharides (NIFEREX) 150 MG capsule Take 1 capsule (150 mg total) by mouth  daily. (Patient not taking: Reported on 01/19/2022) 30 capsule 5 Not Taking   oxyCODONE-acetaminophen (PERCOCET) 5-325 MG tablet Take 1 tablet by mouth every 4 (four) hours as needed for severe pain. (Patient not taking: Reported on 01/19/2022) 20 tablet 0 Completed Course   rosuvastatin (CRESTOR) 20 MG tablet Take 1 tablet (20 mg total) by mouth daily. (Patient not taking: Reported on 10/03/2021) 30 tablet 3 Not Taking    Review of Systems  Constitutional: Positive for malaise/fatigue.  Cardiovascular:  Positive for leg swelling and palpitations (Currently absent). Negative for chest pain, dyspnea on exertion and syncope.     Physical Exam: Physical Exam Vitals and nursing note reviewed.  Constitutional:      General: He is not in acute distress. Neck:     Vascular: No JVD.  Cardiovascular:     Rate and Rhythm: Normal rate and regular rhythm.     Heart sounds: Normal heart sounds. No murmur heard. Pulmonary:     Effort: Pulmonary effort is normal.     Breath sounds: Examination of the right-lower field reveals wheezing. Examination of the left-lower field reveals wheezing. Wheezing present. No rales.  Musculoskeletal:     Right lower leg: Edema (1+) present.     Left lower leg: Edema (1+) present.       Lab Results: Reviewed and interpreted:  Latest Reference Range & Units 01/19/22 10:28  B Natriuretic Peptide 0.0 - 100.0 pg/mL 524.2 (H)  (H): Data is abnormally high  Imaging/tests reviewed and  independently interpreted:  CXR 01/19/2022: 1. The appearance the chest is concerning for mild congestive heart failure, as above.    Cardiac Studies:  Telemetry 01/20/2022: No arrhytmia  EKG 01/19/2022: Atrial flutter with RVR RBBB, LAFB  Echocardiogram 01/20/2022:  1. Left ventricular ejection fraction, by estimation, is 55 to 60%. The  left ventricle has normal function. There is the interventricular septum  is flattened in systole, consistent with right ventricular pressure  overload.   2. Right ventricular systolic function is normal. The right ventricular  size is mildly enlarged. There is severely elevated pulmonary artery  systolic pressure.   3. Tricuspid valve regurgitation is moderate.   4. The aortic valve is tricuspid. Aortic valve regurgitation is not  visualized.   5. The inferior vena cava is normal in size with greater than 50%  respiratory variability, suggesting right atrial pressure of 3 mmHg.   6. No significant change compared to previous study in 09/2021.   Echocardiogram 10/11/2021:  1. Left ventricular ejection fraction, by estimation, is 55 to 60%. The  left ventricle has normal function. The left ventricle has no regional  wall motion abnormalities. Left ventricular diastolic parameters are  indeterminate. There is the  interventricular septum is flattened in diastole ('D' shaped left  ventricle), consistent with right ventricular volume overload.   2. Right ventricular systolic function is normal. The right ventricular  size is normal. There is moderately elevated pulmonary artery systolic  pressure.   3. The mitral valve is normal in structure. No evidence of mitral valve  regurgitation. No evidence of mitral stenosis.   4. Tricuspid valve regurgitation is moderate.   5. The aortic valve is normal in structure. There is mild thickening of  the aortic valve. Aortic valve regurgitation is mild. No aortic stenosis  is present.   6. The inferior vena  cava is normal in size with greater than 50%  respiratory variability, suggesting right atrial pressure of 3 mmHg.   Comparison(s): A prior study  was performed on 07/30/2018.   Assessment & Recommendations:  76 year old African-American male, Jehovah's witness, with stage IIb marginal zone lymphoma, rheumatoid arthritis, anemia,  CAD with mild ischemia (04/2020), paroxysmal atrial flutter, h/o PE-currently on Pradaxa, pulmonary hypertension.  Paroxysmal atrial flutter: Likely cause of patient's frequent palpitation symptoms. When first diagnosed in 09/2021, he was not recommended anticoagulation due to postoperative anemia and patient being Jehovah's Witness.  However, anemia has since resolved.  Was since also found to have bilateral acute PE in 11/2021, and is now on Pradaxa. I reviewed and compared patient's previous EKGs with the current EKG.  He does indeed seem to have reentrant tachycardia, most likely atrial flutter. He is currently on diltiazem with controlled rate while in sinus rhythm. I do think he will benefit from discussion regarding antiarrhythmic therapy versus ablation.  I will refer him to electrophysiology outpatient. Given high CHA2DS2-VASc score and recurrent paroxysmal atrial flutter, continue Pradaxa for now.  Pulmonary hypertension: This was seen on echocardiogram on 10/11/2021.  His PE diagnosis was only made in 11/2021.  However, there is no other immediate known etiology for his pulmonary hypertension other than PE.  At some point, he may benefit from right heart catheterization to evaluate this further. Pulmonary hypertension is most likely cause of his bilateral leg edema.  Recommend IV Lasix 40 mg daily for now. Patient also help with his mild vascular congestion noted on chest x-ray as well as physical exam.  Discussed interpretation of tests and management recommendations with the primary team     Nigel Mormon, MD Pager: [phone] Office:  [phone]

## 2022-01-20 NOTE — Progress Notes (Signed)
?  Echocardiogram ?2D Echocardiogram has been performed. ? ?Bobbye Charleston ?01/20/2022, 3:23 PM ?

## 2022-01-20 NOTE — Assessment & Plan Note (Signed)
CT PE protocol in outside hospital in 11/2021 showed acute small segmental pulmonary emboli in the right lower lobe.  Venous Doppler was positive for right lower extremity proximal DVT. ?Patient was started on Pradaxa. ?Referred to hematology outpatient. ?

## 2022-01-21 ENCOUNTER — Other Ambulatory Visit: Payer: Self-pay | Admitting: Cardiology

## 2022-01-21 DIAGNOSIS — I4891 Unspecified atrial fibrillation: Secondary | ICD-10-CM | POA: Diagnosis not present

## 2022-01-21 DIAGNOSIS — I4892 Unspecified atrial flutter: Secondary | ICD-10-CM

## 2022-01-21 LAB — BASIC METABOLIC PANEL
Anion gap: 7 (ref 5–15)
BUN: 22 mg/dL (ref 8–23)
CO2: 33 mmol/L — ABNORMAL HIGH (ref 22–32)
Calcium: 8.7 mg/dL — ABNORMAL LOW (ref 8.9–10.3)
Chloride: 100 mmol/L (ref 98–111)
Creatinine, Ser: 1.11 mg/dL (ref 0.61–1.24)
GFR, Estimated: >60 mL/min (ref >60)
Glucose, Bld: 104 mg/dL — ABNORMAL HIGH (ref 70–99)
Potassium: 3.9 mmol/L (ref 3.5–5.1)
Sodium: 140 mmol/L (ref 135–145)

## 2022-01-21 LAB — MAGNESIUM: Magnesium: 1.9 mg/dL (ref 1.7–2.4)

## 2022-01-21 MED ORDER — FUROSEMIDE 40 MG PO TABS: 40.0000 mg | ORAL_TABLET | Freq: Every day | ORAL | 0 refills | Status: DC

## 2022-01-21 MED ORDER — LEVALBUTEROL TARTRATE 45 MCG/ACT IN AERO: 1.0000 | INHALATION_SPRAY | RESPIRATORY_TRACT | 2 refills | Status: DC | PRN

## 2022-01-21 MED ORDER — FUROSEMIDE 20 MG PO TABS: 20.0000 mg | ORAL_TABLET | Freq: Every day | ORAL | 0 refills | Status: DC

## 2022-01-21 MED ORDER — FUROSEMIDE 10 MG/ML IJ SOLN
40.0000 mg | Freq: Once | INTRAMUSCULAR | Status: AC
  Administered 2022-01-21: 40 mg via INTRAVENOUS
  Filled 2022-01-21: qty 4

## 2022-01-21 MED ORDER — GUAIFENESIN ER 600 MG PO TB12: 600.0000 mg | ORAL_TABLET | Freq: Two times a day (BID) | ORAL | 2 refills | Status: DC

## 2022-01-21 MED ORDER — BENZONATATE 100 MG PO CAPS: 100.0000 mg | ORAL_CAPSULE | Freq: Four times a day (QID) | ORAL | 0 refills | Status: DC | PRN

## 2022-01-21 NOTE — Discharge Summary (Signed)
Physician Discharge Summary   Patient: Bryan [name] MRN: [id number] DOB: 07-16-1946  Admit date:     01/19/2022  Discharge date: 01/21/2022  Discharge Physician: Berle Mull  PCP: [name], FNP  Recommendations at discharge: Follow-up with PCP and cardiology as recommended.  Discharge Diagnoses: Principal Problem:   Atrial fibrillation with RVR (HCC) Active Problems:   Elevated troponin   Acute on chronic diastolic CHF (congestive heart failure) (HCC)   Obesity (BMI 30-39.9)   Refusal of blood transfusions as patient is Jehovah's Witness   OSA on CPAP   Asthmatic bronchitis   BPH (benign prostatic hyperplasia)   Essential tremor   Essential hypertension   Coronary artery disease involving native coronary artery of native heart without angina pectoris   History of pulmonary embolism   Hospital Course: Past medical history of type II DM, obesity, OSA, HTN, A-fib, Jehovah's Witness, recent DVT PE.  Presented with complaints of shortness of breath found to have A-fib with RVR again. [facility] cardiology consulted.  Assessment and Plan: * Atrial fibrillation with RVR (HCC) Elevated troponin-demand ischemia. Presents with complaints of shortness of breath and rib cage pain.  Found to have A-fib with RVR.  Started on Cardizem infusion. Cardiology was consulted. Currently appears to be rate controlled. Cardiology recommends to continue Toprol-XL as well as Cardizem. Echocardiogram EF 55 to 60%.   Patient was admitted at [facility] between 1/24 - 2/9 for A- flutter with RVR.  Stress test on 2/7 was negative for inducible ischemia.  EF was 60 to 65% there.  Patient was started on Pradaxa due to interaction of other DOACs with his chronic Mysoline use.  Acute on chronic diastolic CHF (congestive heart failure) ([place]) Patient reports orthopnea and PND. Volume overloaded as well. Treated with IV Lasix.  We will continue Lasix on discharge.  Discontinue  HCTZ.  Refusal of blood transfusions as patient is Jehovah's Witness Noted.  Obesity (BMI 30-39.9) OSA on CPAP. Body mass index is 36.49 kg/m.  Placing the patient at high risk of poor outcome. Continue CPAP nightly.  History of pulmonary embolism CT PE protocol in outside hospital in 11/2021 showed acute small segmental pulmonary emboli in the right lower lobe.  Venous Doppler was positive for right lower extremity proximal DVT. Patient was started on Pradaxa. During the hospitalization patient was referred to hematology outpatient.  Coronary artery disease involving native coronary artery of native heart without angina pectoris Recent stress test negative in February 2023.  Essential hypertension Blood pressure stable.  Continue current regimen.  Essential tremor Patient is on chronic treatment Mysoline.  Continue.  BPH (benign prostatic hyperplasia) Continue Flomax.  Asthmatic bronchitis Continue current inhalers and nebulizer therapy.  Suspect wheezing is most likely from cardiac origin.  Consultants: Cardiology Procedures performed:  Echocardiogram   DISCHARGE MEDICATION: Allergies as of 01/21/2022       Reactions   Terazosin Other (See Comments)   Hypotension   Adalimumab Other (See Comments)   (Humira) Lymphoma   Statins Other (See Comments)   Memory issues   Ampicillin Nausea Only   Lisinopril Other (See Comments), Cough   Cough   Other Other (See Comments)   BLOOD PRODUCT REFUSAL (patient is a Jehovah's Witness)        Medication List     STOP taking these medications    hydrochlorothiazide 25 MG tablet Commonly known as: HYDRODIURIL   oxyCODONE-acetaminophen 5-325 MG tablet Commonly known as: Percocet   traMADol 50 MG tablet  Commonly known as: ULTRAM       TAKE these medications    B-12 1000 MCG Subl Place 2,000 mcg under the tongue daily.   benzonatate 100 MG capsule Commonly known as: Tessalon Perles Take 1 capsule (100 mg  total) by mouth every 6 (six) hours as needed for cough.   dabigatran 150 MG Caps capsule Commonly known as: PRADAXA Take 150 mg by mouth 2 (two) times daily.   diltiazem 120 MG 24 hr capsule Commonly known as: TIAZAC Take 120 mg by mouth 2 (two) times daily.   famotidine 20 MG tablet Commonly known as: PEPCID Take 1 tablet (20 mg total) by mouth 2 (two) times daily.   furosemide 20 MG tablet Commonly known as: Lasix Take 1 tablet (20 mg total) by mouth daily. Take extra 20 mg tablet for weight gain of more than 3lbs in 1 day or 5 lbs in 1 week.   gabapentin 600 MG tablet Commonly known as: NEURONTIN Take 600 mg by mouth 2 (two) times daily.   guaiFENesin 600 MG 12 hr tablet Commonly known as: Mucinex Take 1 tablet (600 mg total) by mouth 2 (two) times daily.   iron polysaccharides 150 MG capsule Commonly known as: NIFEREX Take 1 capsule (150 mg total) by mouth daily.   LACTOBACILLUS PO Take 1 tablet by mouth in the morning and at bedtime.   levalbuterol 45 MCG/ACT inhaler Commonly known as: XOPENEX HFA Inhale 1 puff into the lungs every 4 (four) hours as needed for wheezing or shortness of breath.   methocarbamol 500 MG tablet Commonly known as: Robaxin Take 1 tablet (500 mg total) by mouth 4 (four) times daily. What changed: when to take this   metoprolol succinate 100 MG 24 hr tablet Commonly known as: TOPROL-XL TAKE 1 TABLET BY MOUTH ONCE DAILY WITH  OR  IMMEDIATELY  FOLLOWING  A  MEAL What changed:  how much to take when to take this additional instructions   nitroGLYCERIN 0.4 MG SL tablet Commonly known as: NITROSTAT Place 0.4 mg under the tongue every 5 (five) minutes as needed for chest pain.   primidone 50 MG tablet Commonly known as: MYSOLINE Take 150 mg by mouth in the morning and at bedtime.   rosuvastatin 20 MG tablet Commonly known as: CRESTOR Take 1 tablet (20 mg total) by mouth daily.   sucralfate 1 g tablet Commonly known as:  Carafate Take 1 tablet (1 g total) by mouth 3 (three) times daily as needed. What changed: reasons to take this   tamsulosin 0.4 MG Caps capsule Commonly known as: FLOMAX Take 0.4 mg by mouth at bedtime.   traMADol-acetaminophen 37.5-325 MG tablet Commonly known as: ULTRACET Take 1 tablet by mouth at bedtime.        Follow-up Information     [name], FNP. Schedule an appointment as soon as possible for a visit in 1 week(s).   Specialty: Family Medicine Contact information: [place] [address] [phone]         Cardiology. Schedule an appointment as soon as possible for a visit in 1 week(s).                 Disposition: Home Diet recommendation:  Discharge Diet Orders (From admission, onward)     Start     Ordered   01/21/22 0000  Diet - low sodium heart healthy        01/21/22 0746  Discharge Exam: Filed Weights   01/19/22 1006  Weight: 101 kg   General: Appear in no distress; no visible Abnormal Neck Mass Or lumps, Conjunctiva normal Cardiovascular: S1 and S2 Present, no Murmur, Respiratory: good respiratory effort, Bilateral Air entry present and no Crackles, Occasional wheezes Abdomen: Bowel Sound present nontender Extremities: trace Pedal edema Neurology: alert and oriented to time, place, and person Gait not checked due to patient safety concerns   Condition at discharge: good  The results of significant diagnostics from this hospitalization (including imaging, microbiology, ancillary and laboratory) are listed below for reference.   Imaging Studies: DG Chest 2 View  Result Date: 01/13/2022 CLINICAL DATA:  Heartburn EXAM: CHEST - 2 VIEW COMPARISON:  Chest radiograph dated October 18, 2021. FINDINGS: The heart size and mediastinal contours are within normal limits. Aorta is tortuous. Both lungs are clear without evidence of focal consolidation or pleural effusion. The visualized skeletal structures are  unremarkable. IMPRESSION: No active cardiopulmonary disease. Electronically Signed   By: Keane Police D.O.   On: 01/13/2022 12:31   DG Chest Port 1 View  Result Date: 01/19/2022 CLINICAL DATA:  76 year old male with history of shortness of breath. Left-sided rib cage pain. EXAM: PORTABLE CHEST 1 VIEW COMPARISON:  Chest x-ray 01/13/2022. FINDINGS: Lung volumes are low. There is cephalization of the pulmonary vasculature and slight indistinctness of the interstitial markings suggestive of mild pulmonary edema. Small right and probable trace left pleural effusions. Mild cardiomegaly. Upper mediastinal contours are within normal limits. Orthopedic fixation hardware in the lower cervical spine incidentally noted. IMPRESSION: 1. The appearance the chest is concerning for mild congestive heart failure, as above. Electronically Signed   By: Vinnie Langton M.D.   On: 01/19/2022 11:06   ECHOCARDIOGRAM LIMITED  Result Date: 01/20/2022    ECHOCARDIOGRAM LIMITED REPORT   Patient Name:   Bryan [name] Date of Exam: 01/20/2022 Medical Rec #:  [id number]     Height:       65.5 in Accession #:    [id number]    Weight:       222.7 lb Date of Birth:  Feb 09, 1946    BSA:          2.082 m Patient Age:    21 years      BP:           130/65 mmHg Patient Gender: M             HR:           76 bpm. Exam Location:  Inpatient Procedure: Limited Echo, Cardiac Doppler, Color Doppler and Intracardiac            Opacification Agent Indications:     R07.9* Chest pain, unspecified  History:         Patient has prior history of Echocardiogram examinations, most                  recent 10/11/2021. CAD, Abnormal ECG, Arrythmias:Atrial                  Fibrillation, Signs/Symptoms:Chest Pain; Risk Factors:Current                  Smoker. Lymphoma.  Sonographer:     Roseanna Rainbow RDCS Referring Phys:  [id number] [place] Diagnosing Phys: Vernell Leep MD  Sonographer Comments: Technically difficult study due to poor echo windows and patient  is morbidly obese. Image acquisition challenging due to patient body habitus. IMPRESSIONS  1. Left  ventricular ejection fraction, by estimation, is 55 to 60%. The left ventricle has normal function. There is the interventricular septum is flattened in systole, consistent with right ventricular pressure overload.  2. Right ventricular systolic function is normal. The right ventricular size is mildly enlarged. There is severely elevated pulmonary artery systolic pressure.  3. Tricuspid valve regurgitation is moderate.  4. The aortic valve is tricuspid. Aortic valve regurgitation is not visualized.  5. The inferior vena cava is normal in size with greater than 50% respiratory variability, suggesting right atrial pressure of 3 mmHg.  6. No significant change compared to previous study in 09/2021. FINDINGS  Left Ventricle: Left ventricular ejection fraction, by estimation, is 55 to 60%. The left ventricle has normal function. Definity contrast agent was given IV to delineate the left ventricular endocardial borders. There is no left ventricular hypertrophy. The interventricular septum is flattened in systole, consistent with right ventricular pressure overload. Right Ventricle: The right ventricular size is mildly enlarged. No increase in right ventricular wall thickness. Right ventricular systolic function is normal. There is severely elevated pulmonary artery systolic pressure. The tricuspid regurgitant velocity is 3.47 m/s, and with an assumed right atrial pressure of 15 mmHg, the estimated right ventricular systolic pressure is 50.5 mmHg. Tricuspid Valve: The tricuspid valve is grossly normal. Tricuspid valve regurgitation is moderate. Aortic Valve: The aortic valve is tricuspid. Aortic valve regurgitation is not visualized. Venous: The inferior vena cava is normal in size with greater than 50% respiratory variability, suggesting right atrial pressure of 3 mmHg. LEFT VENTRICLE PLAX 2D LVIDd:         4.80 cm LVIDs:          3.40 cm LV PW:         1.00 cm LV IVS:        0.90 cm  LV Volumes (MOD) LV vol d, MOD A2C: 128.5 ml LV vol d, MOD A4C: 134.0 ml LV vol s, MOD A2C: 61.1 ml LV vol s, MOD A4C: 52.9 ml LV SV MOD A2C:     67.4 ml LV SV MOD A4C:     134.0 ml LV SV MOD BP:      76.3 ml IVC IVC diam: 1.70 cm LEFT ATRIUM         Index LA diam:    3.50 cm 1.68 cm/m   AORTA Ao Root diam: 3.40 cm Ao Asc diam:  3.00 cm TRICUSPID VALVE TR Peak grad:   48.2 mmHg TR Vmax:        347.00 cm/s Vernell Leep MD Electronically signed by Vernell Leep MD Signature Date/Time: 01/20/2022/7:21:44 PM    Final     Microbiology: Results for orders placed or performed during the hospital encounter of 01/19/22  Resp Panel by RT-PCR (Flu A&B, Covid) Nasopharyngeal Swab     Status: None   Collection Time: 01/19/22 10:28 AM   Specimen: Nasopharyngeal Swab; Nasopharyngeal(NP) swabs in vial transport medium  Result Value Ref Range Status   SARS Coronavirus 2 by RT PCR NEGATIVE NEGATIVE Final    Comment: (NOTE) SARS-CoV-2 target nucleic acids are NOT DETECTED.  The SARS-CoV-2 RNA is generally detectable in upper respiratory specimens during the acute phase of infection. The lowest concentration of SARS-CoV-2 viral copies this assay can detect is 138 copies/mL. A negative result does not preclude SARS-Cov-2 infection and should not be used as the sole basis for treatment or other patient management decisions. A negative result may occur with  improper specimen collection/handling, submission of specimen  other than nasopharyngeal swab, presence of viral mutation(s) within the areas targeted by this assay, and inadequate number of viral copies(<138 copies/mL). A negative result must be combined with clinical observations, patient history, and epidemiological information. The expected result is Negative.  Fact Sheet for Patients:  EntrepreneurPulse.com.au  Fact Sheet for Healthcare Providers:   IncredibleEmployment.be  This test is no t yet approved or cleared by the [place] FDA and  has been authorized for detection and/or diagnosis of SARS-CoV-2 by FDA under an Emergency Use Authorization (EUA). This EUA will remain  in effect (meaning this test can be used) for the duration of the COVID-19 declaration under Section 564(b)(1) of the Act, 21 U.S.C.section 360bbb-3(b)(1), unless the authorization is terminated  or revoked sooner.       Influenza A by PCR NEGATIVE NEGATIVE Final   Influenza B by PCR NEGATIVE NEGATIVE Final    Comment: (NOTE) The Xpert Xpress SARS-CoV-2/FLU/RSV plus assay is intended as an aid in the diagnosis of influenza from Nasopharyngeal swab specimens and should not be used as a sole basis for treatment. Nasal washings and aspirates are unacceptable for Xpert Xpress SARS-CoV-2/FLU/RSV testing.  Fact Sheet for Patients: EntrepreneurPulse.com.au  Fact Sheet for Healthcare Providers: IncredibleEmployment.be  This test is not yet approved or cleared by the [place] FDA and has been authorized for detection and/or diagnosis of SARS-CoV-2 by FDA under an Emergency Use Authorization (EUA). This EUA will remain in effect (meaning this test can be used) for the duration of the COVID-19 declaration under Section 564(b)(1) of the Act, 21 U.S.C. section 360bbb-3(b)(1), unless the authorization is terminated or revoked.  Performed at [facility], [place] [address] [place], [place] [address]    Labs: CBC: Recent Labs  Lab 01/19/22 1028  WBC 9.6  NEUTROABS 8.0*  HGB 10.7*  HCT 33.7*  MCV 93.4  PLT 803   Basic Metabolic Panel: Recent Labs  Lab 01/19/22 1028 01/20/22 0837 01/21/22 0515  NA 135 138 140  K 2.9* 4.4 3.9  CL 92* 99 100  CO2 32 31 33*  GLUCOSE 145* 124* 104*  BUN '16 16 22  '$ CREATININE 1.14 1.16 1.11  CALCIUM 8.7* 8.6* 8.7*  MG 1.5* 1.8 1.9    Liver Function Tests: Recent Labs  Lab 01/19/22 1028  AST 17  ALT 13  ALKPHOS 124  BILITOT 0.3  PROT 7.7  ALBUMIN 3.2*   CBG: Recent Labs  Lab 01/19/22 2000 01/20/22 0725 01/20/22 1212 01/20/22 1601 01/20/22 2042  GLUCAP 114* 102* 81 78 98   Discharge time spent: greater than 30 minutes.  Signed: Berle Mull, MD [facility] 01/21/2022

## 2022-01-21 NOTE — Progress Notes (Signed)
Pt and wife given and explained discharge instructions. All questions answered. IV's removed. Tele removed. Pt and wife instructed on where to pick up medications. Pt dressed in personal clothing and taken to the main entrance via wheelchair.  ?

## 2022-01-22 LAB — GLUCOSE, CAPILLARY: Glucose-Capillary: 89 mg/dL (ref 70–99)

## 2022-01-23 ENCOUNTER — Other Ambulatory Visit: Payer: Self-pay

## 2022-01-23 ENCOUNTER — Emergency Department (HOSPITAL_COMMUNITY)
Admission: EM | Admit: 2022-01-23 | Discharge: 2022-01-23 | Disposition: A | Discharge status: Home / Self Care | Payer: No Typology Code available for payment source | Attending: Emergency Medicine | Admitting: Emergency Medicine

## 2022-01-23 ENCOUNTER — Encounter (HOSPITAL_COMMUNITY): Payer: Self-pay

## 2022-01-23 DIAGNOSIS — Z79899 Other long term (current) drug therapy: Secondary | ICD-10-CM | POA: Diagnosis not present

## 2022-01-23 DIAGNOSIS — Z20822 Contact with and (suspected) exposure to covid-19: Secondary | ICD-10-CM | POA: Insufficient documentation

## 2022-01-23 DIAGNOSIS — M542 Cervicalgia: Secondary | ICD-10-CM | POA: Diagnosis not present

## 2022-01-23 DIAGNOSIS — R531 Weakness: Secondary | ICD-10-CM | POA: Diagnosis not present

## 2022-01-23 DIAGNOSIS — J029 Acute pharyngitis, unspecified: Secondary | ICD-10-CM | POA: Insufficient documentation

## 2022-01-23 LAB — GROUP A STREP BY PCR: Group A Strep by PCR: NOT DETECTED

## 2022-01-23 LAB — RESP PANEL BY RT-PCR (FLU A&B, COVID) ARPGX2
Influenza A by PCR: NEGATIVE
Influenza B by PCR: NEGATIVE
SARS Coronavirus 2 by RT PCR: NEGATIVE

## 2022-01-23 NOTE — ED Provider Notes (Signed)
?[facility] DEPT ?Provider Note ? ? ?CSN: [id number] ?Arrival date & time: 01/23/22  1644 ? ?  ? ?History ? ?Chief Complaint  ?Patient presents with  ? Weakness  ? Neck Pain  ? ? ?Bryan [name] is a 76 y.o. male. ? ?76 year old male with prior medical history as detailed below presents for evaluation.  Patient complains of mild scratchy sore throat.  Symptoms began 2 days ago.  He denies fever.  He also complains of mild congestion.  He denies shortness of breath or chest pain.  He denies known sick contacts. ? ?He denies difficulty swallowing.  He denies neck pain. ? ? ? ?The history is provided by the patient and medical records.  ?Illness ?Location:  Scratchy, sore throat, congestion ?Severity:  Mild ?Onset quality:  Gradual ?Duration:  2 days ?Timing:  Constant ?Progression:  Waxing and waning ?Chronicity:  New ? ?  ? ?Home Medications ?Prior to Admission medications   ?Medication Sig Start Date End Date Taking? Authorizing Provider  ?benzonatate (TESSALON PERLES) 100 MG capsule Take 1 capsule (100 mg total) by mouth every 6 (six) hours as needed for cough. 01/21/22 01/21/23  Lavina Hamman, MD  ?Cyanocobalamin (B-12) 1000 MCG SUBL Place 2,000 mcg under the tongue daily. ?Patient not taking: Reported on 01/19/2022 10/04/21   [name], MD  ?dabigatran (PRADAXA) 150 MG CAPS capsule Take 150 mg by mouth 2 (two) times daily. 12/28/21   [provider]  ?diltiazem (TIAZAC) 120 MG 24 hr capsule Take 120 mg by mouth 2 (two) times daily. 12/28/21   [provider]  ?famotidine (PEPCID) 20 MG tablet Take 1 tablet (20 mg total) by mouth 2 (two) times daily. 01/13/22   Prosperi, Christian H, PA-C  ?furosemide (LASIX) 20 MG tablet Take 1 tablet (20 mg total) by mouth daily. Take extra 20 mg tablet for weight gain of more than 3lbs in 1 day or 5 lbs in 1 week. 01/21/22 01/21/23  Lavina Hamman, MD  ?gabapentin (NEURONTIN) 600 MG tablet Take 600 mg by mouth 2 (two) times  daily. 12/28/21   [provider]  ?guaiFENesin (MUCINEX) 600 MG 12 hr tablet Take 1 tablet (600 mg total) by mouth 2 (two) times daily. 01/21/22 01/21/23  Lavina Hamman, MD  ?iron polysaccharides (NIFEREX) 150 MG capsule Take 1 capsule (150 mg total) by mouth daily. ?Patient not taking: Reported on 01/19/2022 10/31/21   [name], MD  ?LACTOBACILLUS PO Take 1 tablet by mouth in the morning and at bedtime. 12/28/21   [provider]  ?levalbuterol Penne Lash HFA) 45 MCG/ACT inhaler Inhale 1 puff into the lungs every 4 (four) hours as needed for wheezing or shortness of breath. 01/21/22 01/21/23  Lavina Hamman, MD  ?methocarbamol (ROBAXIN) 500 MG tablet Take 1 tablet (500 mg total) by mouth 4 (four) times daily. ?Patient taking differently: Take 500 mg by mouth in the morning and at bedtime. 10/20/21   Meyran, Ocie Cornfield, NP  ?metoprolol succinate (TOPROL-XL) 100 MG 24 hr tablet TAKE 1 TABLET BY MOUTH ONCE DAILY WITH  OR  IMMEDIATELY  FOLLOWING  A  MEAL ?Patient taking differently: 100 mg 2 (two) times daily. 05/10/20   Patwardhan, Reynold Bowen, MD  ?nitroGLYCERIN (NITROSTAT) 0.4 MG SL tablet Place 0.4 mg under the tongue every 5 (five) minutes as needed for chest pain.    [provider]  ?primidone (MYSOLINE) 50 MG tablet Take 150 mg by mouth in the morning and at  bedtime.    [provider]  ?rosuvastatin (CRESTOR) 20 MG tablet Take 1 tablet (20 mg total) by mouth daily. ?Patient not taking: Reported on 10/03/2021 09/29/21 01/27/22  Lawerance Cruel C, PA-C  ?sucralfate (CARAFATE) 1 g tablet Take 1 tablet (1 g total) by mouth 3 (three) times daily as needed. ?Patient taking differently: Take 1 g by mouth 3 (three) times daily as needed (stomach upset). 01/13/22   Prosperi, Darrick Meigs H, PA-C  ?Tamsulosin HCl (FLOMAX) 0.4 MG CAPS Take 0.4 mg by mouth at bedtime.    [provider]  ?traMADol-acetaminophen (ULTRACET) 37.5-325 MG tablet Take 1 tablet by mouth at bedtime.  11/20/21   [provider]  ?   ? ?Allergies    ?Terazosin, Adalimumab, Statins, Ampicillin, Lisinopril, and Other   ? ?Review of Systems   ?Review of Systems  ?All other systems reviewed and are negative. ? ?Physical Exam ?Updated Vital Signs ?BP (!) 156/81 (BP Location: Right Arm)   Pulse 80   Temp 99.3 ?F (37.4 ?C) (Oral)   Resp 18   Ht 5' 5.5" (1.664 m)   Wt 95.3 kg   SpO2 96%   BMI 34.41 kg/m?  ?Physical Exam ?Vitals and nursing note reviewed.  ?Constitutional:   ?   General: He is not in acute distress. ?   Appearance: Normal appearance. He is well-developed.  ?HENT:  ?   Head: Normocephalic and atraumatic.  ?   Mouth/Throat:  ?   Comments: Mild posterior pharynx erythema.  No evidence of peritonsillar abscess, other significant pathology. ?Eyes:  ?   Conjunctiva/sclera: Conjunctivae normal.  ?   Pupils: Pupils are equal, round, and reactive to light.  ?Cardiovascular:  ?   Rate and Rhythm: Normal rate and regular rhythm.  ?   Heart sounds: Normal heart sounds.  ?Pulmonary:  ?   Effort: Pulmonary effort is normal. No respiratory distress.  ?   Breath sounds: Normal breath sounds.  ?Abdominal:  ?   General: There is no distension.  ?   Palpations: Abdomen is soft.  ?   Tenderness: There is no abdominal tenderness.  ?Musculoskeletal:     ?   General: No deformity. Normal range of motion.  ?   Cervical back: Normal range of motion and neck supple.  ?Skin: ?   General: Skin is warm and dry.  ?Neurological:  ?   General: No focal deficit present.  ?   Mental Status: He is alert and oriented to person, place, and time.  ? ? ?ED Results / Procedures / Treatments   ?Labs ?(all labs ordered are listed, but only abnormal results are displayed) ?Labs Reviewed  ?GROUP A STREP BY PCR  ?RESP PANEL BY RT-PCR (FLU A&B, COVID) [id number]  ? ? ?EKG ?None ? ?Radiology ?No results found. ? ?Procedures ?Procedures  ? ? ?Medications Ordered in ED ?Medications - No data to display ? ?ED Course/ Medical Decision Making/  A&P ?  ?                        ?Medical Decision Making ? ? ?Medical Screen Complete ? ?This patient presented to the ED with complaint of sore throat. ? ?This complaint involves an extensive number of treatment options. The initial differential diagnosis includes, but is not limited to, viral pharyngitis, seasonal allergies, etc. ? ?This presentation is: Acute, Self-Limited, Previously Undiagnosed, Uncertain Prognosis, Complicated, Systemic Symptoms, and Threat to Life/Bodily Function ? ?Patient is presenting with complaint of  mild sore throat.  Symptoms present for 2 days. ? ?Symptoms are most consistent with likely mild viral URI versus possible seasonal allergies. ? ?Strep, flu, COVID testing are negative. ? ?Patient is reassured by evaluation here in the ED.  He does understand need for close follow-up.  Strict return precautions given and understood. ? ? ?Additional history obtained: ? ?External records from outside sources obtained and reviewed including prior ED visits and prior Inpatient records.  ? ?Problem List / ED Course: ? ?Sore throat ? ? ?Reevaluation: ? ?After the interventions noted above, I reevaluated the patient and found that they have: improved ? ? ?Disposition: ? ?After consideration of the diagnostic results and the patients response to treatment, I feel that the patent would benefit from close outpatient follow-up.  ? ? ? ? ? ? ? ? ?Final Clinical Impression(s) / ED Diagnoses ?Final diagnoses:  ?Sore throat  ? ? ?Rx / DC Orders ?ED Discharge Orders   ? ? None  ? ?  ? ? ?  ?Valarie Merino, MD ?01/23/22 1901 ? ?

## 2022-01-23 NOTE — ED Triage Notes (Signed)
Patient c/o fatigue, weakness, and soreness of the left lateral neck since this AM. ?

## 2022-01-23 NOTE — Discharge Instructions (Addendum)
Return for any problem.  ?

## 2022-01-23 NOTE — ED Provider Triage Note (Signed)
Emergency Medicine Provider Triage Evaluation Note ? ?Bryan [name] , a 76 y.o. male  was evaluated in triage.  Pt complains of gradual onset, constant, achy, sore throat that began earlier today. Pt mentions he has been coughing for the past several days. He denies SOB, fevers. No recent sick contacts. Is vaccinated for COVID and flu. ? ?Review of Systems  ?Positive: + sore throat, cough ?Negative: - body aches, fevers, chills, difficulty swallowing, voice change ? ?Physical Exam  ?BP (!) 169/75   Pulse 81   Temp 99.3 ?F (37.4 ?C) (Oral)   Resp 16   Ht 5' 5.5" (1.664 m)   Wt 95.3 kg   SpO2 92%   BMI 34.41 kg/m?  ?Gen:   Awake, no distress   ?Resp:  Normal effort  ?MSK:   Moves extremities without difficulty  ?Other:   ? ?Medical Decision Making  ?Medically screening exam initiated at 5:31 PM.  Appropriate orders placed.  Bryan [name] was informed that the remainder of the evaluation will be completed by another provider, this initial triage assessment does not replace that evaluation, and the importance of remaining in the ED until their evaluation is complete. ? ? ?  ?Eustaquio Maize, PA-C ?01/23/22 1732 ? ?

## 2022-02-05 NOTE — Progress Notes (Signed)
? ? ?Patient referred by [name], FNP for precordial pain ? ?Subjective:  ? ?Bryan [name], male    DOB: Nov 04, 1946, 76 y.o.   MRN: [id number] ? ? ?Chief Complaint  ?Patient presents with  ? Atrial Flutter  ? ? ? ?HPI ? ?76 y.o. African-American male, [name] witness, with stage IIb marginal zone lymphoma, rheumatoid arthritis, anemia,  CAD with mild ischemia (04/2020), paroxysmal atrial flutter, h/o PE-currently on Pradaxa, pulmonary hypertension. ? ?Patient admitted 01/19/2022 - 01/21/2022 for atrial fibrillation with RVR and acute on chronic diastolic heart failure.  Patient's rate control medications were uptitrated and he was diuresed, subsequently discharged on Pradaxa, diltiazem, Lasix, Toprol-XL, and rosuvastatin. Patient now presents for follow up. Since discharge patient has been experiencing productive cough which is worse at night.  As well as dyspnea on exertion and bilateral leg swelling.  Patient also reports episodes of stool incontinence at night.  He is following up with neurosurgery in 2 days and has been encouraged to discuss this with Dr. Saintclair Halsted.  Notably patient is scheduled to see Dr. Curt Bears for further evaluation and management of atrial fibrillation/flutter next month. ? ?Patient has again discontinued statin therapy given concern for worsening memory loss.  He is unwilling to rechallenge statin therapy at this time.  ? ?Denies chest pain, palpitations, syncope, near syncope. ? ?Current Outpatient Medications on File Prior to Visit  ?Medication Sig Dispense Refill  ? benzonatate (TESSALON PERLES) 100 MG capsule Take 1 capsule (100 mg total) by mouth every 6 (six) hours as needed for cough. 30 capsule 0  ? Cyanocobalamin (B-12) 1000 MCG SUBL Place 2,000 mcg under the tongue daily. 60 tablet 5  ? dabigatran (PRADAXA) 150 MG CAPS capsule Take 150 mg by mouth 2 (two) times daily.    ? diltiazem (TIAZAC) 120 MG 24 hr capsule Take 120 mg by mouth 2 (two) times daily.    ? ezetimibe (ZETIA)  10 MG tablet Take 10 mg by mouth daily.    ? famotidine (PEPCID) 20 MG tablet Take 1 tablet (20 mg total) by mouth 2 (two) times daily. 60 tablet 0  ? furosemide (LASIX) 20 MG tablet Take 1 tablet (20 mg total) by mouth daily. Take extra 20 mg tablet for weight gain of more than 3lbs in 1 day or 5 lbs in 1 week. 30 tablet 0  ? gabapentin (NEURONTIN) 600 MG tablet Take 600 mg by mouth 2 (two) times daily.    ? guaiFENesin (MUCINEX) 600 MG 12 hr tablet Take 1 tablet (600 mg total) by mouth 2 (two) times daily. 60 tablet 2  ? iron polysaccharides (NIFEREX) 150 MG capsule Take 1 capsule (150 mg total) by mouth daily. 30 capsule 5  ? LACTOBACILLUS PO Take 1 tablet by mouth in the morning and at bedtime.    ? levalbuterol (XOPENEX HFA) 45 MCG/ACT inhaler Inhale 1 puff into the lungs every 4 (four) hours as needed for wheezing or shortness of breath. 1 each 2  ? methocarbamol (ROBAXIN) 500 MG tablet Take 1 tablet (500 mg total) by mouth 4 (four) times daily. (Patient taking differently: Take 500 mg by mouth in the morning and at bedtime.) 45 tablet 0  ? metoprolol succinate (TOPROL-XL) 100 MG 24 hr tablet TAKE 1 TABLET BY MOUTH ONCE DAILY WITH  OR  IMMEDIATELY  FOLLOWING  A  MEAL (Patient taking differently: 100 mg 2 (two) times daily.) 90 tablet 2  ? nitroGLYCERIN (NITROSTAT) 0.4 MG SL tablet Place 0.4 mg  under the tongue every 5 (five) minutes as needed for chest pain.    ? primidone (MYSOLINE) 50 MG tablet Take 150 mg by mouth in the morning and at bedtime.    ? sucralfate (CARAFATE) 1 g tablet Take 1 tablet (1 g total) by mouth 3 (three) times daily as needed. 30 tablet 0  ? Tamsulosin HCl (FLOMAX) 0.4 MG CAPS Take 0.4 mg by mouth at bedtime.    ? traMADol-acetaminophen (ULTRACET) 37.5-325 MG tablet Take 1 tablet by mouth at bedtime.    ? rosuvastatin (CRESTOR) 20 MG tablet Take 1 tablet (20 mg total) by mouth daily. (Patient not taking: Reported on 02/06/2022) 30 tablet 3  ? ?No current facility-administered  medications on file prior to visit.  ? ? ?Cardiovascular and other pertinent studies: ?EKG 02/06/2022:  ?Sinus rhythm at a rate of 75 bpm.  Left atrial enlargement.  Right bundle branch block.  2 PACs. ? ?EKG 01/19/2022: ?Atrial flutter with RVR ?RBBB, LAFB ? ?Echocardiogram 01/20/2022:  ? 1. Left ventricular ejection fraction, by estimation, is 55 to 60%. The  ?left ventricle has normal function. There is the interventricular septum  ?is flattened in systole, consistent with right ventricular pressure  ?overload.  ? 2. Right ventricular systolic function is normal. The right ventricular  ?size is mildly enlarged. There is severely elevated pulmonary artery  ?systolic pressure.  ? 3. Tricuspid valve regurgitation is moderate.  ? 4. The aortic valve is tricuspid. Aortic valve regurgitation is not  ?visualized.  ? 5. The inferior vena cava is normal in size with greater than 50%  ?respiratory variability, suggesting right atrial pressure of 3 mmHg.  ? 6. No significant change compared to previous study in 09/2021.  ? ?EKG 09/29/2021: ?Sinus rhythm with single PAC at a rate of 81 bpm.  Normal axis.  Right bundle branch block.  No evidence of ischemia or underlying injury pattern.  Compared to EKG 03/25/2020, no significant change.  ? ?Event monitor 04/28/2020 - 05/11/2020: ?Diagnostic time: 74%  ?Dominant rhythm: Sinus. ?HR 52-109 bpm. Avg HR 68 bpm. ?5 beat NSVT at 2:44 am CT on 04/28/2020.  ?Recurring episodes of atrial tachycardia <30 sec on 05/06/2020 10:14 AM CT ?No atrial fibrillation/atrial flutter/SVT/VT/high grade AV block, sinus pause >3sec noted. ?Symptoms reported: None ? ?Lexiscan Tetrofosmin stress test 04/13/2020: ?Lexiscan/modified Bruce nuclear stress test performed using 1-day protocol. Stress EKG is non-diagnostic, as this is pharmacological stress test. In addition, stress EKG at 67% MPHR showed sinus tachycardia, RBBB, LAFB.  ?SPECT images show small sized, mild intensity, reversible perfusion defect in  apical anteroseptal myocardium. Stress LVEF 42%. ?Intermediate risk study. ? ?EKG 03/25/2020: ?Sinus rhythm 84 bpm ?Right bundle branch block.  ?Frequent PAC s  ? ?CTA chest 02/2020: ?Negative for PE ? ?Coronary CT angiogram 07/2018: ?1. Coronary calcium score of 21. This was 58 percentile for age and ?sex matched control. ?2. Normal coronary origin with right dominance. ?3. There is mild to moderate CAD in the proximal and mid LAD. ?Aggressive risk factor modification is recommended. ? ?Recent labs: ? ?  Latest Ref Rng & Units 01/21/2022  ?  5:15 AM 01/20/2022  ?  8:37 AM 01/19/2022  ? 10:28 AM  ?CMP  ?Glucose 70 - 99 mg/dL 104   124   145    ?BUN 8 - 23 mg/dL 22   16   16     ?Creatinine 0.61 - 1.24 mg/dL 1.11   1.16   1.14    ?Sodium 135 - 145  mmol/L 140   138   135    ?Potassium 3.5 - 5.1 mmol/L 3.9   4.4   2.9    ?Chloride 98 - 111 mmol/L 100   99   92    ?CO2 22 - 32 mmol/L 33   31   32    ?Calcium 8.9 - 10.3 mg/dL 8.7   8.6   8.7    ?Total Protein 6.5 - 8.1 g/dL   7.7    ?Total Bilirubin 0.3 - 1.2 mg/dL   0.3    ?Alkaline Phos 38 - 126 U/L   124    ?AST 15 - 41 U/L   17    ?ALT 0 - 44 U/L   13    ? ? ?  Latest Ref Rng & Units 01/19/2022  ? 10:28 AM 01/13/2022  ? 12:01 PM 10/17/2021  ?  2:36 AM  ?CBC  ?WBC 4.0 - 10.5 K/uL 9.6   7.1   7.8    ?Hemoglobin 13.0 - 17.0 g/dL 10.7   10.2   6.5    ?Hematocrit 39.0 - 52.0 % 33.7   33.0   20.5    ?Platelets 150 - 400 K/uL 330   285   343    ? ?Lipid Panel  ?   ?Component Value Date/Time  ? CHOL 168 02/01/2017 0845  ? TRIG 65 02/01/2017 0845  ? HDL 69 02/01/2017 0845  ? CHOLHDL 2.4 02/01/2017 0845  ? CHOLHDL 3.6 05/12/2014 0926  ? VLDL 19 05/12/2014 0926  ? [place] 86 02/01/2017 0845  ? ?HEMOGLOBIN A1C ?Lab Results  ?Component Value Date  ? HGBA1C 5.8 (H) 01/19/2022  ? MPG 120 01/19/2022  ? ?TSH ?No results for input(s): TSH in the last 8760 hours. ? ?03/04/2020: ?Glucose 80, BUN/Cr 18/1.25. EGFR >60. Na/K 147/3.7.  ?H/H 10.8/34.6. MCV 93.8. Platelets 257 ?HbA1C N/A ? ?2018: ?Chol  168, TG 65, HDL 69, LDL 86 ?TSH normal ? ? ? ?Review of Systems  ?Constitutional: Negative for malaise/fatigue and weight gain.  ?Cardiovascular:  Positive for dyspnea on exertion and leg swelling. Negative for ch

## 2022-02-06 ENCOUNTER — Other Ambulatory Visit: Payer: Self-pay

## 2022-02-06 ENCOUNTER — Encounter: Payer: Self-pay | Admitting: Student

## 2022-02-06 ENCOUNTER — Ambulatory Visit: Payer: Medicare HMO | Admitting: Student

## 2022-02-06 VITALS — BP 120/66 | HR 87 | Temp 98.2°F | Resp 17 | Ht 65.5 in | Wt 221.0 lb

## 2022-02-06 DIAGNOSIS — I10 Essential (primary) hypertension: Secondary | ICD-10-CM

## 2022-02-06 DIAGNOSIS — I2510 Atherosclerotic heart disease of native coronary artery without angina pectoris: Secondary | ICD-10-CM

## 2022-02-06 DIAGNOSIS — R0601 Orthopnea: Secondary | ICD-10-CM

## 2022-02-06 DIAGNOSIS — I4892 Unspecified atrial flutter: Secondary | ICD-10-CM

## 2022-02-06 DIAGNOSIS — R058 Other specified cough: Secondary | ICD-10-CM

## 2022-02-06 DIAGNOSIS — R0609 Other forms of dyspnea: Secondary | ICD-10-CM

## 2022-02-09 ENCOUNTER — Other Ambulatory Visit: Payer: Self-pay | Admitting: Neurosurgery

## 2022-02-09 DIAGNOSIS — M48062 Spinal stenosis, lumbar region with neurogenic claudication: Secondary | ICD-10-CM

## 2022-02-20 ENCOUNTER — Encounter (HOSPITAL_COMMUNITY): Admission: RE | Payer: Self-pay | Source: Ambulatory Visit

## 2022-02-20 ENCOUNTER — Ambulatory Visit (HOSPITAL_COMMUNITY): Admission: RE | Admit: 2022-02-20 | Payer: Medicare HMO | Source: Ambulatory Visit | Admitting: Cardiology

## 2022-02-20 DIAGNOSIS — R0609 Other forms of dyspnea: Secondary | ICD-10-CM | POA: Diagnosis present

## 2022-02-20 SURGERY — RIGHT HEART CATH

## 2022-02-26 ENCOUNTER — Institutional Professional Consult (permissible substitution): Payer: No Typology Code available for payment source | Admitting: Cardiology

## 2022-02-26 ENCOUNTER — Encounter (INDEPENDENT_AMBULATORY_CARE_PROVIDER_SITE_OTHER): Payer: Medicare HMO | Admitting: Ophthalmology

## 2022-02-28 NOTE — Progress Notes (Deleted)
Synopsis: Referred for Dyspnea by Alethia Berthold, PA*  Subjective:   PATIENT ID: Bryan [name] GENDER: male DOB: 09/02/1946, MRN: [id number]  No chief complaint on file.  76yM with history of stage IIb marginal zone lymphoma, history of PE (11/2021) on pradaxa, GERD, OSA, obesity, HTN, RA, PH, RBBB, ET, ACDF C4-C6, former smoking referred for shortness of breath   Recent admission for acute on chronic diastolic heart failure and AF/RVR. At follow up with cardiology in clinic, lasix dose was increased to 40 mg daily x3d then resume 20 mg daily, plan for RHC  Otherwise pertinent review of systems is negative.  Past Medical History:  Diagnosis Date   Anemia    Arthritis    hands, knees, cervical area. Back pain. Rheumatoid arthritis- weekly injections.   BPH (benign prostatic hypertrophy)    Cancer (HCC)    Diabetes (HCC)    Essential tremor    GERD (gastroesophageal reflux disease)    reports for indigestion he uses mustard    Hearing deficit    wears hearing aids bilateral   HTN (hypertension)    Obesity    OSA (obstructive sleep apnea)    Refusal of blood transfusions as patient is Jehovah's Witness    Rheumatoid arthritis ([place])    Right bundle branch block    history of   Seizures ([place])    AS A CHILD. only esential tremors now.   Sleep apnea    cpap - settings at 9 per patient    Ulcer      Family History  Problem Relation Age of Onset   Colon cancer Maternal Grandfather    Kidney disease Brother    Hypertension Brother    Diabetes Brother    Stroke Maternal Grandmother    Diabetes Mother    Hypertension Mother    CAD Father        died of MI at age 27   Hypertension Father    Diabetes Paternal Aunt        x 4 aunts     Past Surgical History:  Procedure Laterality Date   ANTERIOR CERVICAL DECOMP/DISCECTOMY FUSION N/A 03/30/2013   Procedure: ANTERIOR CERVICAL DECOMPRESSION/DISCECTOMY FUSION 2 LEVELS;  Surgeon: Otilio Connors, MD;  Location: [facility];  Service: Neurosurgery;  Laterality: N/A;  C4-5 C5-6 Anterior cervical decompression/diskectomy/fusion/Allograft/Plate   CHOLECYSTECTOMY N/A 12/07/2013   Procedure: LAPAROSCOPIC CHOLECYSTECTOMY WITH INTRAOPERATIVE CHOLANGIOGRAM;  Surgeon: Adin Hector, MD;  Location: [facility];  Service: General;  Laterality: N/A;   COLONOSCOPY     DECOMPRESSIVE LUMBAR LAMINECTOMY LEVEL 2 N/A 02/15/2015   Procedure: COMPLETE DECOMPRESSIVE LUMBAR LAMINECTOMY L4-L5/ FORAMINOTOMY TO L4 NERVE ROOT AND L5 NERVE ROOT BILATERALLY;  Surgeon: [name], MD;  Location: [facility];  Service: Orthopedics;  Laterality: N/A;   EYE SURGERY     right, growth excision   LUMBAR LAMINECTOMY/DECOMPRESSION MICRODISCECTOMY Left 01/18/2016   Procedure:  DECOMPRESSION L4-L5 MICRODISCECTOMY L4-L5 ON LEFT FOR SPINAL STENOSIS;  Surgeon: [name], MD;  Location: [facility];  Service: Orthopedics;  Laterality: Left;   PAROTIDECTOMY Left 01/24/2018   Procedure: INCISIONAL BIOPSY OF LEFT PAROTID    MASS;  Surgeon: Helayne Seminole, MD;  Location: [place];  Service: ENT;  Laterality: Left;   SPINE SURGERY     TONSILLECTOMY     VASECTOMY     WRIST GANGLION EXCISION Left     Social History   Socioeconomic History   Marital status: Married    Spouse name:  Not on file   Number of children: 2   Years of education: Not on file   Highest education level: Not on file  Occupational History   Occupation: bus driver    Employer: RETIRED  Tobacco Use   Smoking status: Former    Packs/day: 0.50    Years: 14.00    Pack years: 7.00    Types: Cigarettes    Quit date: 12/14/1972    Years since quitting: 49.2   Smokeless tobacco: Never  Vaping Use   Vaping Use: Never used  Substance and Sexual Activity   Alcohol use: Yes    Comment: occasionally   Drug use: No   Sexual activity: Yes    Birth control/protection: None  Other Topics Concern   Not on file  Social History Narrative   Lives with wife   Social Determinants of Health    Financial Resource Strain: Not on file  Food Insecurity: Not on file  Transportation Needs: Not on file  Physical Activity: Not on file  Stress: Not on file  Social Connections: Not on file  Intimate Partner Violence: Not on file     Allergies  Allergen Reactions   Terazosin Other (See Comments)    Hypotension   Adalimumab Other (See Comments)    (Humira) Lymphoma   Statins Other (See Comments)    Memory issues   Ampicillin Nausea Only   Lisinopril Other (See Comments) and Cough    Cough   Other Other (See Comments)    BLOOD PRODUCT REFUSAL (patient is a Sales promotion account executive Witness)      Outpatient Medications Prior to Visit  Medication Sig Dispense Refill   benzonatate (TESSALON PERLES) 100 MG capsule Take 1 capsule (100 mg total) by mouth every 6 (six) hours as needed for cough. 30 capsule 0   Cyanocobalamin (B-12) 1000 MCG SUBL Place 2,000 mcg under the tongue daily. 60 tablet 5   dabigatran (PRADAXA) 150 MG CAPS capsule Take 150 mg by mouth 2 (two) times daily.     diltiazem (TIAZAC) 120 MG 24 hr capsule Take 120 mg by mouth 2 (two) times daily.     ezetimibe (ZETIA) 10 MG tablet Take 10 mg by mouth daily.     famotidine (PEPCID) 20 MG tablet Take 1 tablet (20 mg total) by mouth 2 (two) times daily. 60 tablet 0   furosemide (LASIX) 20 MG tablet Take 1 tablet (20 mg total) by mouth daily. Take extra 20 mg tablet for weight gain of more than 3lbs in 1 day or 5 lbs in 1 week. 30 tablet 0   gabapentin (NEURONTIN) 600 MG tablet Take 600 mg by mouth 2 (two) times daily.     guaiFENesin (MUCINEX) 600 MG 12 hr tablet Take 1 tablet (600 mg total) by mouth 2 (two) times daily. 60 tablet 2   iron polysaccharides (NIFEREX) 150 MG capsule Take 1 capsule (150 mg total) by mouth daily. 30 capsule 5   LACTOBACILLUS PO Take 1 tablet by mouth in the morning and at bedtime.     levalbuterol (XOPENEX HFA) 45 MCG/ACT inhaler Inhale 1 puff into the lungs every 4 (four) hours as needed for wheezing or  shortness of breath. 1 each 2   methocarbamol (ROBAXIN) 500 MG tablet Take 1 tablet (500 mg total) by mouth 4 (four) times daily. (Patient taking differently: Take 500 mg by mouth in the morning and at bedtime.) 45 tablet 0   metoprolol succinate (TOPROL-XL) 100 MG 24 hr tablet TAKE 1  TABLET BY MOUTH ONCE DAILY WITH  OR  IMMEDIATELY  FOLLOWING  A  MEAL (Patient taking differently: 100 mg 2 (two) times daily.) 90 tablet 2   nitroGLYCERIN (NITROSTAT) 0.4 MG SL tablet Place 0.4 mg under the tongue every 5 (five) minutes as needed for chest pain.     primidone (MYSOLINE) 50 MG tablet Take 150 mg by mouth in the morning and at bedtime.     rosuvastatin (CRESTOR) 20 MG tablet Take 1 tablet (20 mg total) by mouth daily. (Patient not taking: Reported on 02/06/2022) 30 tablet 3   sucralfate (CARAFATE) 1 g tablet Take 1 tablet (1 g total) by mouth 3 (three) times daily as needed. 30 tablet 0   Tamsulosin HCl (FLOMAX) 0.4 MG CAPS Take 0.4 mg by mouth at bedtime.     traMADol-acetaminophen (ULTRACET) 37.5-325 MG tablet Take 1 tablet by mouth at bedtime.     No facility-administered medications prior to visit.       Objective:   Physical Exam:  General appearance: 76 y.o., male, NAD, conversant  Eyes: anicteric sclerae; PERRL, tracking appropriately HENT: NCAT; MMM Neck: Trachea midline; no lymphadenopathy, no JVD Lungs: CTAB, no crackles, no wheeze, with normal respiratory effort CV: RRR, no murmur  Abdomen: Soft, non-tender; non-distended, BS present  Extremities: No peripheral edema, warm Skin: Normal turgor and texture; no rash Psych: Appropriate affect Neuro: Alert and oriented to person and place, no focal deficit     [name]   on *** LPM *** RA BMI Readings from Last 3 Encounters:  02/06/22 36.22 kg/m  01/23/22 34.41 kg/m  01/19/22 36.49 kg/m   Wt Readings from Last 3 Encounters:  02/06/22 221 lb (100.2 kg)  01/23/22 210 lb (95.3 kg)   01/19/22 222 lb 10.6 oz (101 kg)     CBC    Component Value Date/Time   WBC 9.6 01/19/2022 1028   RBC 3.61 (L) 01/19/2022 1028   HGB 10.7 (L) 01/19/2022 1028   HGB 12.2 (L) 09/26/2021 1443   HCT 33.7 (L) 01/19/2022 1028   HCT 42.7 12/19/2017 1416   PLT 330 01/19/2022 1028   PLT 234 09/26/2021 1443   MCV 93.4 01/19/2022 1028   MCV 94.0 05/09/2018 1805   MCH 29.6 01/19/2022 1028   MCHC 31.8 01/19/2022 1028   RDW 15.9 (H) 01/19/2022 1028   LYMPHSABS 0.9 01/19/2022 1028   MONOABS 0.7 01/19/2022 1028   EOSABS 0.0 01/19/2022 1028   BASOSABS 0.0 01/19/2022 1028     Chest Imaging: CT Chest 09/2021 reviewed by me remarkable for RUL posterior segment and RLL superior segment scarring.  Pulmonary Functions Testing Results:    Latest Ref Rng & Units 02/26/2018    9:48 AM  PFT Results  FVC-Pre L 2.44    FVC-Predicted Pre % 79    FVC-Post L 2.47    FVC-Predicted Post % 80    Pre FEV1/FVC % % 77    Post FEV1/FCV % % 79    FEV1-Pre L 1.88    FEV1-Predicted Pre % 83    FEV1-Post L 1.95    DLCO uncorrected ml/min/mmHg 15.33    DLCO UNC% % 59    DLCO corrected ml/min/mmHg 16.22    DLCO COR %Predicted % 63    DLVA Predicted % 96    TLC L 4.82    TLC % Predicted % 79    RV % Predicted % 101    Reviewed by me with mild  obstruction (by FEV1/SVC) and mild restriction, moderately reduced diffusing capacity which corrects for alveolar ventilation  PSG/split night 2014 AHI 34.6. Needed BiPAP 24/18   Echocardiogram:    TTE 10/11/21 Left ventricular ejection fraction, by estimation, is 55 to 60%. The left ventricle has normal function. The left ventricle has no regional wall motion abnormalities. Left ventricular diastolic parameters are indeterminate. There is the interventricular septum is flattened in diastole ('D' shaped left ventricle), consistent with right ventricular volume overload. 1. Right ventricular systolic function is normal. The right ventricular size is  normal. There is moderately elevated pulmonary artery systolic pressure (RVSP 74.8). 2. The mitral valve is normal in structure. No evidence of mitral valve regurgitation. No evidence of mitral stenosis. 3. 4. Tricuspid valve regurgitation is moderate. The aortic valve is normal in structure. There is mild thickening of the aortic valve. Aortic valve regurgitation is mild. No aortic stenosis is present. 5. The inferior vena cava is normal in size with greater >50% variability  Lexiscan Tetrofosmin stress test 04/13/2020: Lexiscan/modified Bruce nuclear stress test performed using 1-day protocol. Stress EKG is non-diagnostic, as this is pharmacological stress test. In addition, stress EKG at 67% MPHR showed sinus tachycardia, RBBB, LAFB.  SPECT images show small sized, mild intensity, reversible perfusion defect in apical anteroseptal myocardium. Stress LVEF 42%. Intermediate risk study.    Assessment & Plan:    Plan:      Maryjane Hurter, MD [facility] 02/28/2022 12:20 PM

## 2022-03-02 ENCOUNTER — Institutional Professional Consult (permissible substitution): Payer: No Typology Code available for payment source | Admitting: Student

## 2022-03-06 ENCOUNTER — Ambulatory Visit: Payer: Medicare HMO | Admitting: Student

## 2022-03-06 ENCOUNTER — Encounter: Payer: Self-pay | Admitting: Student

## 2022-03-06 VITALS — BP 119/67 | HR 83 | Temp 98.0°F | Resp 17 | Ht 65.5 in | Wt 207.4 lb

## 2022-03-06 DIAGNOSIS — I4892 Unspecified atrial flutter: Secondary | ICD-10-CM

## 2022-03-06 DIAGNOSIS — R0609 Other forms of dyspnea: Secondary | ICD-10-CM

## 2022-03-06 DIAGNOSIS — I2510 Atherosclerotic heart disease of native coronary artery without angina pectoris: Secondary | ICD-10-CM

## 2022-03-06 DIAGNOSIS — I10 Essential (primary) hypertension: Secondary | ICD-10-CM

## 2022-03-06 NOTE — Progress Notes (Signed)
? ? ?Patient referred by [name], FNP for precordial pain ? ?Subjective:  ? ?[name], male    DOB: 06/06/1946, 76 y.o.   MRN: [id number] ? ? ?Chief Complaint  ?Patient presents with  ? Paroxysmal atrial flutter ([place])  ?  2 WEEKS  ? ? ? ?HPI ? ?76 y.o. African-American male, [name] witness, with stage IIb marginal zone lymphoma, rheumatoid arthritis, anemia,  CAD with mild ischemia (04/2020), paroxysmal atrial flutter, h/o PE-currently on Pradaxa, pulmonary hypertension. ? ?Patient admitted 01/19/2022 - 01/21/2022 for atrial fibrillation with RVR and acute on chronic diastolic heart failure.  Patient's rate control medications were uptitrated and he was diuresed, subsequently discharged on Pradaxa, diltiazem, Lasix, Toprol-XL, and rosuvastatin.  ? ?Patient was seen in follow-up 02/06/2022 at which time given volume overloaded on exam increase Lasix to 40 mg daily for 3 days, repeat BMP and BNP have not been done.  Given concern for noncardiac etiology of patient's symptoms ordered CBC which has also not been done and Chest x-ray which was not done.  Patient was also referred to pulmonology for further evaluation and management, he no showed his appointment with pulmonology.  Patient was scheduled to see Dr. Curt Bears for follow-up of paroxysmal atrial flutter, he unfortunately missed this appointment and is now scheduled to see him June 1. ? ?Patient previously been scheduled for right heart catheterization to further evaluate pulmonary hypertension, however patient canceled this procedure given anxiety about side effects of anesthesia.  He is without specific complaints today.  Continues to tolerate anticoagulation without bleeding diathesis.  Denies palpitations, dizziness, chest pain. ? ?Patient remains resistant to statin therapy given concern for worsening memory loss, he is unwilling to rechallenge statin therapy at this time.  ? ?Current Outpatient Medications on File Prior to Visit  ?Medication  Sig Dispense Refill  ? dabigatran (PRADAXA) 150 MG CAPS capsule Take 150 mg by mouth 2 (two) times daily.    ? diltiazem (TIAZAC) 120 MG 24 hr capsule Take 120 mg by mouth 2 (two) times daily.    ? ezetimibe (ZETIA) 10 MG tablet Take 10 mg by mouth daily.    ? famotidine (PEPCID) 20 MG tablet Take 1 tablet (20 mg total) by mouth 2 (two) times daily. 60 tablet 0  ? furosemide (LASIX) 20 MG tablet Take 1 tablet (20 mg total) by mouth daily. Take extra 20 mg tablet for weight gain of more than 3lbs in 1 day or 5 lbs in 1 week. 30 tablet 0  ? gabapentin (NEURONTIN) 600 MG tablet Take 600 mg by mouth 2 (two) times daily.    ? guaiFENesin (MUCINEX) 600 MG 12 hr tablet Take 1 tablet (600 mg total) by mouth 2 (two) times daily. 60 tablet 2  ? iron polysaccharides (NIFEREX) 150 MG capsule Take 1 capsule (150 mg total) by mouth daily. 30 capsule 5  ? levalbuterol (XOPENEX HFA) 45 MCG/ACT inhaler Inhale 1 puff into the lungs every 4 (four) hours as needed for wheezing or shortness of breath. 1 each 2  ? methocarbamol (ROBAXIN) 500 MG tablet Take 1 tablet (500 mg total) by mouth 4 (four) times daily. (Patient taking differently: Take 500 mg by mouth in the morning and at bedtime.) 45 tablet 0  ? metoprolol succinate (TOPROL-XL) 100 MG 24 hr tablet TAKE 1 TABLET BY MOUTH ONCE DAILY WITH  OR  IMMEDIATELY  FOLLOWING  A  MEAL (Patient taking differently: 100 mg 2 (two) times daily.) 90 tablet 2  ?  nitroGLYCERIN (NITROSTAT) 0.4 MG SL tablet Place 0.4 mg under the tongue every 5 (five) minutes as needed for chest pain.    ? PRIMIDONE PO Take 150 mg by mouth in the morning and at bedtime. 117m morning/15100mbedtime    ? sucralfate (CARAFATE) 1 g tablet Take 1 tablet (1 g total) by mouth 3 (three) times daily as needed. 30 tablet 0  ? Tamsulosin HCl (FLOMAX) 0.4 MG CAPS Take 0.4 mg by mouth at bedtime.    ? traMADol-acetaminophen (ULTRACET) 37.5-325 MG tablet Take 1 tablet by mouth at bedtime.    ? ?No current facility-administered  medications on file prior to visit.  ? ? ?Cardiovascular and other pertinent studies: ?EKG 03/06/2022: ?Sinus rhythm with PACs at a rate of 80 bpm.  Left atrial enlargement.  Right bundle branch block.  Unchanged compared to EKG 02/06/2022. ? ?EKG 01/19/2022: ?Atrial flutter with RVR ?RBBB, LAFB ? ?Echocardiogram 01/20/2022:  ? 1. Left ventricular ejection fraction, by estimation, is 55 to 60%. The  ?left ventricle has normal function. There is the interventricular septum  ?is flattened in systole, consistent with right ventricular pressure  ?overload.  ? 2. Right ventricular systolic function is normal. The right ventricular  ?size is mildly enlarged. There is severely elevated pulmonary artery  ?systolic pressure.  ? 3. Tricuspid valve regurgitation is moderate.  ? 4. The aortic valve is tricuspid. Aortic valve regurgitation is not  ?visualized.  ? 5. The inferior vena cava is normal in size with greater than 50%  ?respiratory variability, suggesting right atrial pressure of 3 mmHg.  ? 6. No significant change compared to previous study in 09/2021.  ? ?EKG 09/29/2021: ?Sinus rhythm with single PAC at a rate of 81 bpm.  Normal axis.  Right bundle branch block.  No evidence of ischemia or underlying injury pattern.  Compared to EKG 03/25/2020, no significant change.  ? ?Event monitor 04/28/2020 - 05/11/2020: ?Diagnostic time: 74%  ?Dominant rhythm: Sinus. ?HR 52-109 bpm. Avg HR 68 bpm. ?5 beat NSVT at 2:44 am CT on 04/28/2020.  ?Recurring episodes of atrial tachycardia <30 sec on 05/06/2020 10:14 AM CT ?No atrial fibrillation/atrial flutter/SVT/VT/high grade AV block, sinus pause >3sec noted. ?Symptoms reported: None ? ?Lexiscan Tetrofosmin stress test 04/13/2020: ?Lexiscan/modified Bruce nuclear stress test performed using 1-day protocol. Stress EKG is non-diagnostic, as this is pharmacological stress test. In addition, stress EKG at 67% MPHR showed sinus tachycardia, RBBB, LAFB.  ?SPECT images show small sized, mild  intensity, reversible perfusion defect in apical anteroseptal myocardium. Stress LVEF 42%. ?Intermediate risk study. ? ?EKG 03/25/2020: ?Sinus rhythm 84 bpm ?Right bundle branch block.  ?Frequent PAC s  ? ?CTA chest 02/2020: ?Negative for PE ? ?Coronary CT angiogram 07/2018: ?1. Coronary calcium score of 21. This was 3877ercentile for age and ?sex matched control. ?2. Normal coronary origin with right dominance. ?3. There is mild to moderate CAD in the proximal and mid LAD. ?Aggressive risk factor modification is recommended. ? ?Recent labs: ? ?  Latest Ref Rng & Units 01/21/2022  ?  5:15 AM 01/20/2022  ?  8:37 AM 01/19/2022  ? 10:28 AM  ?CMP  ?Glucose 70 - 99 mg/dL 104   124   145    ?BUN 8 - 23 mg/dL _0 ?Creatinine 0.61 - 1.24 mg/dL 1.11   1.16   1.14    ?Sodium 135 - 145 mmol/L 140   138   135    ?Potassium  3.5 - 5.1 mmol/L 3.9   4.4   2.9    ?Chloride 98 - 111 mmol/L 100   99   92    ?CO2 22 - 32 mmol/L 33   31   32    ?Calcium 8.9 - 10.3 mg/dL 8.7   8.6   8.7    ?Total Protein 6.5 - 8.1 g/dL   7.7    ?Total Bilirubin 0.3 - 1.2 mg/dL   0.3    ?Alkaline Phos 38 - 126 U/L   124    ?AST 15 - 41 U/L   17    ?ALT 0 - 44 U/L   13    ? ? ?  Latest Ref Rng & Units 01/19/2022  ? 10:28 AM 01/13/2022  ? 12:01 PM 10/17/2021  ?  2:36 AM  ?CBC  ?WBC 4.0 - 10.5 K/uL 9.6   7.1   7.8    ?Hemoglobin 13.0 - 17.0 g/dL 10.7   10.2   6.5    ?Hematocrit 39.0 - 52.0 % 33.7   33.0   20.5    ?Platelets 150 - 400 K/uL 330   285   343    ? ?Lipid Panel  ?   ?Component Value Date/Time  ? CHOL 168 02/01/2017 0845  ? TRIG 65 02/01/2017 0845  ? HDL 69 02/01/2017 0845  ? CHOLHDL 2.4 02/01/2017 0845  ? CHOLHDL 3.6 05/12/2014 0926  ? VLDL 19 05/12/2014 0926  ? [place] 86 02/01/2017 0845  ? ?HEMOGLOBIN A1C ?Lab Results  ?Component Value Date  ? HGBA1C 5.8 (H) 01/19/2022  ? MPG 120 01/19/2022  ? ?TSH ?No results for input(s): TSH in the last 8760 hours. ? ?03/04/2020: ?Glucose 80, BUN/Cr 18/1.25. EGFR >60. Na/K 147/3.7.  ?H/H 10.8/34.6. MCV  93.8. Platelets 257 ?HbA1C N/A ? ?2018: ?Chol 168, TG 65, HDL 69, LDL 86 ?TSH normal ? ?Review of Systems  ?Constitutional: Negative for malaise/fatigue and weight gain.  ?Cardiovascular:  Positive for dyspnea on ex

## 2022-03-08 ENCOUNTER — Encounter (HOSPITAL_COMMUNITY): Payer: Self-pay

## 2022-03-08 ENCOUNTER — Emergency Department (HOSPITAL_COMMUNITY)
Admission: EM | Admit: 2022-03-08 | Discharge: 2022-03-08 | Disposition: A | Discharge status: Home / Self Care | Payer: No Typology Code available for payment source | Attending: Emergency Medicine | Admitting: Emergency Medicine

## 2022-03-08 ENCOUNTER — Other Ambulatory Visit: Payer: Self-pay

## 2022-03-08 DIAGNOSIS — Z79899 Other long term (current) drug therapy: Secondary | ICD-10-CM | POA: Diagnosis not present

## 2022-03-08 DIAGNOSIS — I10 Essential (primary) hypertension: Secondary | ICD-10-CM | POA: Diagnosis not present

## 2022-03-08 DIAGNOSIS — N644 Mastodynia: Secondary | ICD-10-CM | POA: Diagnosis present

## 2022-03-08 MED ORDER — AMOXICILLIN-POT CLAVULANATE 875-125 MG PO TABS
1.0000 | ORAL_TABLET | Freq: Once | ORAL | Status: AC
Start: 2022-03-08 — End: 2022-03-08
  Administered 2022-03-08: 1 via ORAL
  Filled 2022-03-08: qty 1

## 2022-03-08 MED ORDER — AMOXICILLIN-POT CLAVULANATE 875-125 MG PO TABS: 1.0000 | ORAL_TABLET | Freq: Two times a day (BID) | ORAL | 0 refills | Status: DC

## 2022-03-08 MED ORDER — ONDANSETRON 8 MG PO TBDP: 8.0000 mg | ORAL_TABLET | Freq: Three times a day (TID) | ORAL | 0 refills | Status: DC | PRN

## 2022-03-08 MED ORDER — ACETAMINOPHEN 500 MG PO TABS
1000.0000 mg | ORAL_TABLET | Freq: Once | ORAL | Status: AC
Start: 2022-03-08 — End: 2022-03-08
  Administered 2022-03-08: 1000 mg via ORAL
  Filled 2022-03-08: qty 2

## 2022-03-08 MED ORDER — ONDANSETRON 8 MG PO TBDP
8.0000 mg | ORAL_TABLET | Freq: Once | ORAL | Status: AC
  Administered 2022-03-08: 8 mg via ORAL
  Filled 2022-03-08: qty 1

## 2022-03-08 NOTE — ED Triage Notes (Signed)
Pt reports with right breast pain and swelling since last night.  ?

## 2022-03-08 NOTE — ED Provider Notes (Signed)
?[facility] DEPT ?Provider Note ? ? ?CSN: [id number] ?Arrival date & time: 03/08/22  0115 ? ?  ? ?History ? ?Chief Complaint  ?Patient presents with  ? Breast Pain  ? ? ?Bryan [name] is a 76 y.o. male. ? ?Pt c/o acute onset localized pain near right nipple. Symptoms acute onset in past day, moderate, sharp, non radiating, constant, at rest, not pleuritic. Localized to few cm area just above right nipple. No trauma to area. No hx same. No discharge or bleeding at nipple. No skin rash/lesions in area. No fever or chills.  ? ?The history is provided by the patient, the spouse and medical records.  ? ?  ? ?Home Medications ?Prior to Admission medications   ?Medication Sig Start Date End Date Taking? Authorizing Provider  ?dabigatran (PRADAXA) 150 MG CAPS capsule Take 150 mg by mouth 2 (two) times daily. 12/28/21   [provider]  ?diltiazem (TIAZAC) 120 MG 24 hr capsule Take 120 mg by mouth 2 (two) times daily. 12/28/21   [provider]  ?ezetimibe (ZETIA) 10 MG tablet Take 10 mg by mouth daily. 01/30/22   [provider]  ?famotidine (PEPCID) 20 MG tablet Take 1 tablet (20 mg total) by mouth 2 (two) times daily. 01/13/22   Prosperi, Christian H, PA-C  ?furosemide (LASIX) 20 MG tablet Take 1 tablet (20 mg total) by mouth daily. Take extra 20 mg tablet for weight gain of more than 3lbs in 1 day or 5 lbs in 1 week. 01/21/22 01/21/23  Lavina Hamman, MD  ?gabapentin (NEURONTIN) 600 MG tablet Take 600 mg by mouth 2 (two) times daily. 12/28/21   [provider]  ?guaiFENesin (MUCINEX) 600 MG 12 hr tablet Take 1 tablet (600 mg total) by mouth 2 (two) times daily. 01/21/22 01/21/23  Lavina Hamman, MD  ?iron polysaccharides (NIFEREX) 150 MG capsule Take 1 capsule (150 mg total) by mouth daily. 10/31/21   [name], MD  ?levalbuterol [facility] HFA) 45 MCG/ACT inhaler Inhale 1 puff into the lungs every 4 (four) hours as needed for wheezing or shortness of  breath. 01/21/22 01/21/23  Lavina Hamman, MD  ?methocarbamol (ROBAXIN) 500 MG tablet Take 1 tablet (500 mg total) by mouth 4 (four) times daily. ?Patient taking differently: Take 500 mg by mouth in the morning and at bedtime. 10/20/21   Meyran, Ocie Cornfield, NP  ?metoprolol succinate (TOPROL-XL) 100 MG 24 hr tablet TAKE 1 TABLET BY MOUTH ONCE DAILY WITH  OR  IMMEDIATELY  FOLLOWING  A  MEAL ?Patient taking differently: 100 mg 2 (two) times daily. 05/10/20   Patwardhan, Reynold Bowen, MD  ?nitroGLYCERIN (NITROSTAT) 0.4 MG SL tablet Place 0.4 mg under the tongue every 5 (five) minutes as needed for chest pain.    [provider]  ?PRIMIDONE PO Take 150 mg by mouth in the morning and at bedtime. '150mg'$  morning/'150mg'$  bedtime    [provider]  ?sucralfate (CARAFATE) 1 g tablet Take 1 tablet (1 g total) by mouth 3 (three) times daily as needed. 01/13/22   Prosperi, Darrick Meigs H, PA-C  ?Tamsulosin HCl (FLOMAX) 0.4 MG CAPS Take 0.4 mg by mouth at bedtime.    [provider]  ?traMADol-acetaminophen (ULTRACET) 37.5-325 MG tablet Take 1 tablet by mouth at bedtime. 11/20/21   [provider]  ?   ? ?Allergies    ?Terazosin, Adalimumab, Statins, Ampicillin, Lisinopril, and Other   ? ?Review of Systems   ?Review of Systems  ?Constitutional:  Negative for chills and fever.  ?HENT:  Negative for sore throat.   ?Eyes:  Negative for redness.  ?Respiratory:  Negative for cough and shortness of breath.   ?Cardiovascular:  Negative for chest pain.  ?     Other than this new, sharp, localized pain/tenderness, no other recent chest pain or discomfort.   ?Gastrointestinal:  Negative for abdominal pain, nausea and vomiting.  ?Genitourinary:  Negative for flank pain.  ?Musculoskeletal:  Negative for neck pain.  ?Skin:  Negative for rash.  ?Neurological:  Negative for headaches.  ?Hematological:  Does not bruise/bleed easily.  ?Psychiatric/Behavioral:  Negative for confusion.   ? ?Physical Exam ?Updated Vital  Signs ?BP (!) 147/67   Pulse 87   Temp 99 ?F (37.2 ?C) (Oral)   Resp 16   Ht 1.664 m (5' 5.5")   Wt 91.6 kg   SpO2 95%   BMI 33.10 kg/m?  ?Physical Exam ?Vitals and nursing note reviewed.  ?Constitutional:   ?   Appearance: Normal appearance. He is well-developed.  ?HENT:  ?   Head: Atraumatic.  ?   Nose: Nose normal.  ?   Mouth/Throat:  ?   Mouth: Mucous membranes are moist.  ?   Pharynx: Oropharynx is clear.  ?Eyes:  ?   General: No scleral icterus. ?   Conjunctiva/sclera: Conjunctivae normal.  ?Neck:  ?   Trachea: No tracheal deviation.  ?Cardiovascular:  ?   Rate and Rhythm: Normal rate and regular rhythm.  ?   Pulses: Normal pulses.  ?   Heart sounds: Normal heart sounds. No murmur heard. ?  No friction rub. No gallop.  ?Pulmonary:  ?   Effort: Pulmonary effort is normal. No accessory muscle usage or respiratory distress.  ?   Breath sounds: Normal breath sounds.  ?   Comments: Firm/indurated area just above right nipple, ~ 3 cm diameter, reproducing patients symptoms. Indurated area is mobile. No nipple discharge. No fluctuance. No skin dimpling or lesions, no erythema. No axillary or other chest/breast mass felt.  ?Abdominal:  ?   General: There is no distension.  ?   Tenderness: There is no abdominal tenderness.  ?Genitourinary: ?   Comments: No cva tenderness. ?Musculoskeletal:     ?   General: No swelling.  ?   Cervical back: Normal range of motion and neck supple. No rigidity.  ?Skin: ?   General: Skin is warm and dry.  ?   Findings: No rash.  ?Neurological:  ?   Mental Status: He is alert.  ?   Comments: Alert, speech clear.   ?Psychiatric:     ?   Mood and Affect: Mood normal.  ? ? ?ED Results / Procedures / Treatments   ?Labs ?(all labs ordered are listed, but only abnormal results are displayed) ?Labs Reviewed - No data to display ? ?EKG ?None ? ?Radiology ?No results found. ? ?Procedures ?Procedures  ? ? ?Medications Ordered in ED ?Medications  ?amoxicillin-clavulanate (AUGMENTIN) 875-125 MG  per tablet 1 tablet (has no administration in time range)  ?ondansetron (ZOFRAN-ODT) disintegrating tablet 8 mg (has no administration in time range)  ? ? ?ED Course/ Medical Decision Making/ A&P ?  ?                        ?Medical Decision Making ?Problems Addressed: ?Breast pain, right: acute illness or injury with systemic symptoms ?   Details: r/o mastitis vs early infection/abscess ?Essential hypertension: chronic illness or injury that  poses a threat to life or bodily functions ? ?Amount and/or Complexity of Data Reviewed ?Independent Historian: spouse ?   Details: hx ?External Data Reviewed: notes. ? ?Risk ?OTC drugs. ?Prescription drug management. ? ? ?Localized, tenderness, indurated area right breast reproducing symptoms ?early infection/abscess. No fluctuance.  ? ?Discussed plan of tx with abx, warm compresses and close pcp f/u to r/o developing abscess, mass, etc.  ? ?Reviewed nursing notes and prior charts for additional history.  ? ?Confirmed nkda.  ? ?Augmentin po, zofran po. Po fluids.  ? ?Acetaminophen po. ? ?Pt appears stable for d/c.  ? ?Return precautions provided.  ? ? ? ? ? ? ? ? ? ?Final Clinical Impression(s) / ED Diagnoses ?Final diagnoses:  ?None  ? ? ?Rx / DC Orders ?ED Discharge Orders   ? ? None  ? ?  ? ? ?  ?Lajean Saver, MD ?03/08/22 0448 ? ?

## 2022-03-08 NOTE — Discharge Instructions (Addendum)
It was our pleasure to provide your ER care today - we hope that you feel better. ? ?Warm compresses to sore area for symptom relief. Take acetaminophen or ibuprofen as need. ? ?Take antibiotic (augmentin) as prescribed. May take zofran as need if nauseated.  ? ?Follow up with your doctor in the coming week for recheck. ? ?Return to ER if worse, new symptoms, fevers, increased swelling, spreading redness, severe/intractable pain, or other concern.  ?

## 2022-03-21 ENCOUNTER — Ambulatory Visit: Payer: Medicare HMO | Admitting: Pulmonary Disease

## 2022-03-21 ENCOUNTER — Encounter: Payer: Self-pay | Admitting: Pulmonary Disease

## 2022-03-21 VITALS — BP 128/70 | HR 74 | Temp 98.0°F | Ht 65.0 in | Wt 210.0 lb

## 2022-03-21 DIAGNOSIS — R053 Chronic cough: Secondary | ICD-10-CM | POA: Diagnosis not present

## 2022-03-21 DIAGNOSIS — R0609 Other forms of dyspnea: Secondary | ICD-10-CM | POA: Diagnosis not present

## 2022-03-21 NOTE — Patient Instructions (Signed)
I sent a message to your cardiology office regarding refilling the medications including Pradaxa, diltiazem, furosemide. ? ?I recommend you continue to follow with [facility] for your cardiology care as I do believe the majority of her symptoms are related to the heart. ? ?If symptoms worsen we can reevaluate with additional testing and possible medications directed toward lungs if working discern that they are contributing to your symptoms. ? ?Return to clinic in 6 months or sooner as needed with Dr. Silas Flood ?

## 2022-03-21 NOTE — Progress Notes (Signed)
$'@Patient'q$  ID: Bryan [name], male    DOB: 1946-09-21, 76 y.o.   MRN: [id number]  Chief Complaint  Patient presents with   Consult    Pt states that in November he had back surgery and was intubated and when they removed the tube he started having a racing heart beat and very tired. And during one visit he had fluid removed off his lungs and three blood clots were removed. Pt states that since coming off of certain medications that his cough is gone and he is not having any DOE noted. Pt has been going between cardiology and PCP and neither are willing to help him so he is weeding himself of of medication stop find the issues.     Referring provider: Alethia Berthold, PA*  HPI:   76 y.o. man whom we are seeing in consultation for evaluation of dyspnea on exertion and cough.  Note from referring provider reviewed.  Discharge summary March 2023 reviewed.  Patient admitted to the hospital 01/2022.  There is found to be in volume overload, atrial fibrillation discovered.  He was placed on anticoagulation, diltiazem, Lasix.  He f followed up in cardiology clinic.  Ongoing dyspnea on exertion and cough.  Encouraged to continue medications.  He said since continue these medications his cough and dyspnea exertion has resolved.  Notably Lasix, diltiazem, etc.  However, he and his wife today appear quite confused but medication regimen.  Significant portion of time going over recent medications via discharge summary as well as recent cardiology notes.  Explained role and rationale for medications and likely why he is improved with diuresis and rate control atrial fibrillation is related to his dyspnea on exertion.  Reviewed 2 chest x-rays March 2023 the first of which is clear, the second which reveals signs of volume overload, pulmonary edema with interstitial thickening, central venous congestion on my interpretation.  PMH: Diastolic congestive heart failure, atrial fibrillation Surgical history:  Cervical spine surgery, cholecystectomy, lumbar spine surgery Family history: Mother with diabetes, hypertension, father with CAD, hypertension Social history: Small smoking history, 7-pack-year, quit mid 1970s, lives in [place] / Pulmonary Flowsheets:   ACT:      View : No data to display.          MMRC:     View : No data to display.          [facility]worth:      View : No data to display.          Tests:   FENO:  No results found for: NITRICOXIDE  PFT:    Latest Ref Rng & Units 02/26/2018    9:48 AM  PFT Results  FVC-Pre L 2.44    FVC-Predicted Pre % 79    FVC-Post L 2.47    FVC-Predicted Post % 80    Pre FEV1/FVC % % 77    Post FEV1/FCV % % 79    FEV1-Pre L 1.88    FEV1-Predicted Pre % 83    FEV1-Post L 1.95    DLCO uncorrected ml/min/mmHg 15.33    DLCO UNC% % 59    DLCO corrected ml/min/mmHg 16.22    DLCO COR %Predicted % 63    DLVA Predicted % 96    TLC L 4.82    TLC % Predicted % 79    RV % Predicted % 101    Personally reviewed and interpreted as spirometry suggestive of mild restriction versus gas trapping.  No bronchodilator  response.  Lung volumes consistent with air trapping.  DLCO moderately reduced, improves when corrected for hemoglobin  WALK:      View : No data to display.          Imaging: No results found.  Lab Results: Personally reviewed CBC    Component Value Date/Time   WBC 9.6 01/19/2022 1028   RBC 3.61 (L) 01/19/2022 1028   HGB 10.7 (L) 01/19/2022 1028   HGB 12.2 (L) 09/26/2021 1443   HCT 33.7 (L) 01/19/2022 1028   HCT 42.7 12/19/2017 1416   PLT 330 01/19/2022 1028   PLT 234 09/26/2021 1443   MCV 93.4 01/19/2022 1028   MCV 94.0 05/09/2018 1805   MCH 29.6 01/19/2022 1028   MCHC 31.8 01/19/2022 1028   RDW 15.9 (H) 01/19/2022 1028   LYMPHSABS 0.9 01/19/2022 1028   MONOABS 0.7 01/19/2022 1028   EOSABS 0.0 01/19/2022 1028   BASOSABS 0.0 01/19/2022 1028    BMET    Component Value Date/Time    NA 140 01/21/2022 0515   K 3.9 01/21/2022 0515   CL 100 01/21/2022 0515   CO2 33 (H) 01/21/2022 0515   GLUCOSE 104 (H) 01/21/2022 0515   BUN 22 01/21/2022 0515   CREATININE 1.11 01/21/2022 0515   CREATININE 1.38 (H) 09/26/2021 1443   CREATININE 1.12 03/31/2016 1031   CALCIUM 8.7 (L) 01/21/2022 0515   GFRNONAA >60 01/21/2022 0515   GFRNONAA 53 (L) 09/26/2021 1443   GFRNONAA 67 12/19/2015 1605   GFRAA >60 03/04/2020 1135   GFRAA 78 12/19/2015 1605    BNP    Component Value Date/Time   BNP 524.2 (H) 01/19/2022 1028   BNP 36.2 06/22/2015 1239    ProBNP No results found for: PROBNP  Specialty Problems       Pulmonary Problems   Asthmatic bronchitis   ACE-inhibitor cough   OSA on CPAP   Dyspnea on exertion    Allergies  Allergen Reactions   Terazosin Other (See Comments)    Hypotension   Adalimumab Other (See Comments)    (Humira) Lymphoma   Statins Other (See Comments)    Memory issues   Ampicillin Nausea Only   Lisinopril Other (See Comments) and Cough    Cough   Other Other (See Comments)    BLOOD PRODUCT REFUSAL (patient is a Sales promotion account executive Witness)     Immunization History  Administered Date(s) Administered   Fluad Quad(high Dose 65+) 10/12/2019, 09/02/2020   Influenza, High Dose Seasonal PF 08/29/2016   Influenza, Seasonal, Injecte, Preservative Fre 10/15/2014   Influenza,inj,Quad PF,6+ Mos 07/20/2015, 08/12/2017   Influenza-Unspecified 09/27/2005, 01/23/2007, 09/11/2007, 09/27/2008, 08/24/2009, 08/23/2010, 08/06/2011, 09/12/2012, 08/12/2014, 06/13/2015, 09/18/2016, 11/26/2018, 09/13/2019, 07/13/2021   PFIZER(Purple Top)SARS-COV-2 Vaccination 12/04/2019, 01/12/2020, 09/02/2020   Pneumococcal Conjugate-13 03/23/2015   Pneumococcal Polysaccharide-23 01/25/2010, 02/12/2013   Tdap 02/15/2012, 12/18/2015   Zoster Recombinat (Shingrix) 07/31/2021, 01/04/2022   Zoster, Live 08/29/2016    Past Medical History:  Diagnosis Date   Anemia    Arthritis     hands, knees, cervical area. Back pain. Rheumatoid arthritis- weekly injections.   BPH (benign prostatic hypertrophy)    Cancer (HCC)    Diabetes (HCC)    Essential tremor    GERD (gastroesophageal reflux disease)    reports for indigestion he uses mustard    Hearing deficit    wears hearing aids bilateral   HTN (hypertension)    Obesity    OSA (obstructive sleep apnea)    Refusal of blood transfusions as patient is  Jehovah's Witness    Rheumatoid arthritis ([place])    Right bundle branch block    history of   Seizures (HCC)    AS A CHILD. only esential tremors now.   Sleep apnea    cpap - settings at 9 per patient    Ulcer     Tobacco History: Social History   Tobacco Use  Smoking Status Former   Packs/day: 0.50   Years: 14.00   Pack years: 7.00   Types: Cigarettes   Quit date: 12/14/1972   Years since quitting: 49.2  Smokeless Tobacco Never   Counseling given: Not Answered   Continue to not smoke  Outpatient Encounter Medications as of 03/21/2022  Medication Sig   ezetimibe (ZETIA) 10 MG tablet Take 10 mg by mouth daily.   famotidine (PEPCID) 20 MG tablet Take 1 tablet (20 mg total) by mouth 2 (two) times daily.   levalbuterol (XOPENEX HFA) 45 MCG/ACT inhaler Inhale 1 puff into the lungs every 4 (four) hours as needed for wheezing or shortness of breath.   metoprolol succinate (TOPROL-XL) 100 MG 24 hr tablet TAKE 1 TABLET BY MOUTH ONCE DAILY WITH  OR  IMMEDIATELY  FOLLOWING  A  MEAL (Patient taking differently: 100 mg 2 (two) times daily.)   nitroGLYCERIN (NITROSTAT) 0.4 MG SL tablet Place 0.4 mg under the tongue every 5 (five) minutes as needed for chest pain.   ondansetron (ZOFRAN-ODT) 8 MG disintegrating tablet Take 1 tablet (8 mg total) by mouth every 8 (eight) hours as needed for nausea or vomiting.   PRIMIDONE PO Take 150 mg by mouth in the morning and at bedtime. '150mg'$  morning/'150mg'$  bedtime   Tamsulosin HCl (FLOMAX) 0.4 MG CAPS Take 0.4 mg by mouth at bedtime.    traMADol-acetaminophen (ULTRACET) 37.5-325 MG tablet Take 1 tablet by mouth at bedtime.   [DISCONTINUED] amoxicillin-clavulanate (AUGMENTIN) 875-125 MG tablet Take 1 tablet by mouth 2 (two) times daily.   [DISCONTINUED] dabigatran (PRADAXA) 150 MG CAPS capsule Take 150 mg by mouth 2 (two) times daily.   [DISCONTINUED] diltiazem (TIAZAC) 120 MG 24 hr capsule Take 120 mg by mouth 2 (two) times daily.   [DISCONTINUED] furosemide (LASIX) 20 MG tablet Take 1 tablet (20 mg total) by mouth daily. Take extra 20 mg tablet for weight gain of more than 3lbs in 1 day or 5 lbs in 1 week.   [DISCONTINUED] gabapentin (NEURONTIN) 600 MG tablet Take 600 mg by mouth 2 (two) times daily.   [DISCONTINUED] guaiFENesin (MUCINEX) 600 MG 12 hr tablet Take 1 tablet (600 mg total) by mouth 2 (two) times daily.   [DISCONTINUED] iron polysaccharides (NIFEREX) 150 MG capsule Take 1 capsule (150 mg total) by mouth daily.   [DISCONTINUED] methocarbamol (ROBAXIN) 500 MG tablet Take 1 tablet (500 mg total) by mouth 4 (four) times daily. (Patient taking differently: Take 500 mg by mouth in the morning and at bedtime.)   [DISCONTINUED] sucralfate (CARAFATE) 1 g tablet Take 1 tablet (1 g total) by mouth 3 (three) times daily as needed.   No facility-administered encounter medications on file as of 03/21/2022.     Review of Systems  Review of Systems  No chest pain with exertion.  No orthopnea or PND.  Comprehensive review of systems otherwise negative. Physical Exam  BP 128/70 (BP Location: Left Arm, Patient Position: Sitting, Cuff Size: Normal)   Pulse 74   Temp 98 F (36.7 C) (Oral)   Ht '5\' 5"'$  (1.651 m)   Wt 210 lb (95.3 kg)  SpO2 99%   BMI 34.95 kg/m   Wt Readings from Last 5 Encounters:  03/21/22 210 lb (95.3 kg)  03/08/22 202 lb (91.6 kg)  03/06/22 207 lb 6.4 oz (94.1 kg)  02/06/22 221 lb (100.2 kg)  01/23/22 210 lb (95.3 kg)    BMI Readings from Last 5 Encounters:  03/21/22 34.95 kg/m  03/08/22 33.10  kg/m  03/06/22 33.99 kg/m  02/06/22 36.22 kg/m  01/23/22 34.41 kg/m     Physical Exam General: Sitting in chair, no acute distress Eyes: EOMI, no icterus Neck: Supple no JVP Pulmonary: Clear, normal work of breathing Cardiovascular: Irregularly irregular, warm Abdomen: Nondistended, bowel is present MSK: No synovitis, no joint effusion Neuro: Normal gait, no weakness Psych: Normal mood, full affect   Assessment & Plan:   Cough, dyspnea on exertion: Symptoms significantly improved with diuresis, outpatient furosemide therapy.  Suspect related volume overload, pulmonary edema.  Encouraged ongoing follow-up with cardiology office.  Stressed importance of medications to keep fluid off of the body as well as keep atrial fibrillation and controlled.  Message sent to cardiology office for follow-up and clarification of medication regimen.  If dyspnea were to worsen in the future, consider addition of bronchodilators given evidence of air trapping on prior PFTs.   Return in about 6 months (around 09/21/2022).   Lanier Clam, MD 03/21/2022   This appointment required 60 minutes of patient care (this includes precharting, chart review, review of results, face-to-face care, etc.).

## 2022-03-22 ENCOUNTER — Telehealth: Payer: Self-pay | Admitting: Student

## 2022-03-22 DIAGNOSIS — I10 Essential (primary) hypertension: Secondary | ICD-10-CM

## 2022-03-22 MED ORDER — FUROSEMIDE 20 MG PO TABS: 20.0000 mg | ORAL_TABLET | Freq: Every day | ORAL | 3 refills | Status: DC

## 2022-03-22 MED ORDER — DILTIAZEM HCL ER COATED BEADS 120 MG PO CP24: 120.0000 mg | ORAL_CAPSULE | Freq: Every day | ORAL | 3 refills | Status: DC

## 2022-03-22 MED ORDER — DABIGATRAN ETEXILATE MESYLATE 150 MG PO CAPS: 150.0000 mg | ORAL_CAPSULE | Freq: Two times a day (BID) | ORAL | 3 refills | Status: DC

## 2022-03-23 NOTE — Telephone Encounter (Signed)
Called and spoke to pt, pt started medications today. Will get labs in 7 days and keep f/u appt.

## 2022-04-12 ENCOUNTER — Institutional Professional Consult (permissible substitution): Payer: No Typology Code available for payment source | Admitting: Cardiology

## 2022-04-23 ENCOUNTER — Emergency Department (HOSPITAL_COMMUNITY): Payer: No Typology Code available for payment source

## 2022-04-23 ENCOUNTER — Other Ambulatory Visit: Payer: Self-pay | Admitting: Student

## 2022-04-23 ENCOUNTER — Other Ambulatory Visit: Payer: Self-pay

## 2022-04-23 ENCOUNTER — Emergency Department (HOSPITAL_COMMUNITY)
Admission: EM | Admit: 2022-04-23 | Discharge: 2022-04-23 | Disposition: A | Discharge status: Home / Self Care | Payer: No Typology Code available for payment source | Attending: Emergency Medicine | Admitting: Emergency Medicine

## 2022-04-23 ENCOUNTER — Encounter (HOSPITAL_COMMUNITY): Payer: Self-pay | Admitting: Oncology

## 2022-04-23 DIAGNOSIS — D649 Anemia, unspecified: Secondary | ICD-10-CM | POA: Diagnosis not present

## 2022-04-23 DIAGNOSIS — R0789 Other chest pain: Secondary | ICD-10-CM | POA: Diagnosis present

## 2022-04-23 DIAGNOSIS — R779 Abnormality of plasma protein, unspecified: Secondary | ICD-10-CM | POA: Insufficient documentation

## 2022-04-23 DIAGNOSIS — Z79899 Other long term (current) drug therapy: Secondary | ICD-10-CM | POA: Diagnosis not present

## 2022-04-23 DIAGNOSIS — I2510 Atherosclerotic heart disease of native coronary artery without angina pectoris: Secondary | ICD-10-CM | POA: Diagnosis not present

## 2022-04-23 DIAGNOSIS — I10 Essential (primary) hypertension: Secondary | ICD-10-CM | POA: Diagnosis not present

## 2022-04-23 LAB — BASIC METABOLIC PANEL
Anion gap: 10 (ref 5–15)
BUN: 19 mg/dL (ref 8–23)
CO2: 25 mmol/L (ref 22–32)
Calcium: 8.5 mg/dL — ABNORMAL LOW (ref 8.9–10.3)
Chloride: 103 mmol/L (ref 98–111)
Creatinine, Ser: 1.22 mg/dL (ref 0.61–1.24)
GFR, Estimated: >60 mL/min (ref >60)
Glucose, Bld: 79 mg/dL (ref 70–99)
Potassium: 3.5 mmol/L (ref 3.5–5.1)
Sodium: 138 mmol/L (ref 135–145)

## 2022-04-23 LAB — CBC
HCT: 35 % — ABNORMAL LOW (ref 39.0–52.0)
Hemoglobin: 10.4 g/dL — ABNORMAL LOW (ref 13.0–17.0)
MCH: 30.2 pg (ref 26.0–34.0)
MCHC: 29.7 g/dL — ABNORMAL LOW (ref 30.0–36.0)
MCV: 101.7 fL — ABNORMAL HIGH (ref 80.0–100.0)
Platelets: 244 10*3/uL (ref 150–400)
RBC: 3.44 MIL/uL — ABNORMAL LOW (ref 4.22–5.81)
RDW: 16.9 % — ABNORMAL HIGH (ref 11.5–15.5)
WBC: 8.8 10*3/uL (ref 4.0–10.5)
nRBC: 0 % (ref 0.0–0.2)

## 2022-04-23 LAB — HEPATIC FUNCTION PANEL
ALT: 15 U/L (ref 0–44)
AST: 21 U/L (ref 15–41)
Albumin: 3.6 g/dL (ref 3.5–5.0)
Alkaline Phosphatase: 97 U/L (ref 38–126)
Bilirubin, Direct: 0.2 mg/dL (ref 0.0–0.2)
Indirect Bilirubin: 0.5 mg/dL (ref 0.3–0.9)
Total Bilirubin: 0.7 mg/dL (ref 0.3–1.2)
Total Protein: 7.7 g/dL (ref 6.5–8.1)

## 2022-04-23 LAB — TROPONIN I (HIGH SENSITIVITY)
Troponin I (High Sensitivity): 19 ng/L — ABNORMAL HIGH (ref <18)
Troponin I (High Sensitivity): 19 ng/L — ABNORMAL HIGH (ref <18)

## 2022-04-23 MED ORDER — KETOROLAC TROMETHAMINE 15 MG/ML IJ SOLN
15.0000 mg | Freq: Once | INTRAMUSCULAR | Status: AC
  Administered 2022-04-23: 15 mg via INTRAMUSCULAR
  Filled 2022-04-23: qty 1

## 2022-04-23 MED ORDER — KETOROLAC TROMETHAMINE 30 MG/ML IJ SOLN: 15.0000 mg | Freq: Once | INTRAMUSCULAR | Status: DC

## 2022-04-23 MED ORDER — FUROSEMIDE 20 MG PO TABS: 20.0000 mg | ORAL_TABLET | Freq: Every day | ORAL | 3 refills | Status: DC

## 2022-04-23 MED ORDER — DILTIAZEM HCL ER COATED BEADS 120 MG PO CP24: 120.0000 mg | ORAL_CAPSULE | Freq: Every day | ORAL | 3 refills | Status: DC

## 2022-04-23 MED ORDER — DABIGATRAN ETEXILATE MESYLATE 150 MG PO CAPS: 150.0000 mg | ORAL_CAPSULE | Freq: Two times a day (BID) | ORAL | 3 refills | Status: DC

## 2022-04-23 NOTE — Discharge Instructions (Signed)
There were no serious problems found with your heart or lungs.  To treat your discomfort, continue to take Aleve.  Also use a heating pad on the sore area every 2-3 hours as needed to help with discomfort.  Follow-up with your doctor for checkup if not better in a few days.

## 2022-04-23 NOTE — ED Provider Notes (Signed)
[facility] DEPT Provider Note   CSN: [id number] Arrival date & time: 04/23/22  1121     History  Chief Complaint  Patient presents with   Chest Pain    Bryan [name] is a 76 y.o. male.  HPI Patient presenting for evaluation of chest discomfort.  Pain started 2 days ago without trauma.  Pain was initially right-sided now is left-sided anterior.  He was worried that screws from his back surgery got loose and caused the problem.      Home Medications Prior to Admission medications   Medication Sig Start Date End Date Taking? Authorizing Provider  dabigatran (PRADAXA) 150 MG CAPS capsule Take 1 capsule (150 mg total) by mouth 2 (two) times daily. 04/23/22   Cantwell, Celeste C, PA-C  diltiazem (CARDIZEM CD) 120 MG 24 hr capsule Take 1 capsule (120 mg total) by mouth daily. 04/23/22 08/21/22  Cantwell, Celeste C, PA-C  ezetimibe (ZETIA) 10 MG tablet Take 10 mg by mouth daily. 01/30/22   [provider]  famotidine (PEPCID) 20 MG tablet Take 1 tablet (20 mg total) by mouth 2 (two) times daily. 01/13/22   Prosperi, Christian H, PA-C  furosemide (LASIX) 20 MG tablet Take 1 tablet (20 mg total) by mouth daily. 04/23/22 08/21/22  Cantwell, Celeste C, PA-C  levalbuterol (XOPENEX HFA) 45 MCG/ACT inhaler Inhale 1 puff into the lungs every 4 (four) hours as needed for wheezing or shortness of breath. 01/21/22 01/21/23  Lavina Hamman, MD  metoprolol succinate (TOPROL-XL) 100 MG 24 hr tablet TAKE 1 TABLET BY MOUTH ONCE DAILY WITH  OR  IMMEDIATELY  FOLLOWING  A  MEAL Patient taking differently: 100 mg 2 (two) times daily. 05/10/20   Patwardhan, Reynold Bowen, MD  nitroGLYCERIN (NITROSTAT) 0.4 MG SL tablet Place 0.4 mg under the tongue every 5 (five) minutes as needed for chest pain.    [provider]  ondansetron (ZOFRAN-ODT) 8 MG disintegrating tablet Take 1 tablet (8 mg total) by mouth every 8 (eight) hours as needed for nausea or vomiting. 03/08/22   Lajean Saver, MD  PRIMIDONE PO Take 150 mg by mouth in the morning and at bedtime. '150mg'$  morning/'150mg'$  bedtime    [provider]  Tamsulosin HCl (FLOMAX) 0.4 MG CAPS Take 0.4 mg by mouth at bedtime.    [provider]  traMADol-acetaminophen (ULTRACET) 37.5-325 MG tablet Take 1 tablet by mouth at bedtime. 11/20/21   [provider]      Allergies    Terazosin, Adalimumab, Statins, Ampicillin, Lisinopril, and Other    Review of Systems   Review of Systems  Physical Exam Updated Vital Signs BP (!) 155/75   Pulse 80   Temp 98.7 F (37.1 C) (Oral)   Resp (!) 21   Ht 5' 5.5" (1.664 m)   Wt 91.2 kg   SpO2 95%   BMI 32.94 kg/m  Physical Exam  ED Results / Procedures / Treatments   Labs (all labs ordered are listed, but only abnormal results are displayed) Labs Reviewed  BASIC METABOLIC PANEL - Abnormal; Notable for the following components:      Result Value   Calcium 8.5 (*)    All other components within normal limits  CBC - Abnormal; Notable for the following components:   RBC 3.44 (*)    Hemoglobin 10.4 (*)    HCT 35.0 (*)    MCV 101.7 (*)    MCHC 29.7 (*)    RDW 16.9 (*)  All other components within normal limits  TROPONIN I (HIGH SENSITIVITY) - Abnormal; Notable for the following components:   Troponin I (High Sensitivity) 19 (*)    All other components within normal limits  TROPONIN I (HIGH SENSITIVITY) - Abnormal; Notable for the following components:   Troponin I (High Sensitivity) 19 (*)    All other components within normal limits  HEPATIC FUNCTION PANEL    EKG EKG Interpretation  Date/Time:  Monday April 23 2022 11:25:26 EDT Ventricular Rate:  84 PR Interval:  126 QRS Duration: 128 QT Interval:  385 QTC Calculation: 456 R Axis:   87 Text Interpretation: Sinus rhythm Atrial premature complexes Right bundle branch block ST elevation suggests acute pericarditis Since last tracing rate slower Otherwise no significant change Confirmed  by Daleen Bo [phone]) on 04/23/2022 8:31:16 PM  Radiology DG Chest 2 View  Result Date: 04/23/2022 CLINICAL DATA:  Chest pain EXAM: CHEST - 2 VIEW COMPARISON:  01/19/2022 FINDINGS: Transverse diameter of heart is slightly increased. There are no signs of alveolar pulmonary edema. Linear densities seen in the medial left lower lung fields. There is blunting of left lateral CP angle. There is no pneumothorax. There is previous surgical fusion in the lower cervical spine and lumbar spine. IMPRESSION: Linear densities in the medial left lower lung fields suggest subsegmental atelectasis. There are no signs of alveolar pulmonary edema or focal pulmonary consolidation. Possible minimal left pleural effusion. Electronically Signed   By: Elmer Picker M.D.   On: 04/23/2022 12:19    Procedures Procedures    Medications Ordered in ED Medications  ketorolac (TORADOL) 15 MG/ML injection 15 mg (15 mg Intramuscular Given 04/23/22 2153)    ED Course/ Medical Decision Making/ A&P                           Medical Decision Making Patient presenting with chest pain for several days has moved from the right to the left chest.  He has marked chest wall tenderness on examination.  Initial findings are reassuring.  Amount and/or Complexity of Data Reviewed Independent Historian:     Details: He is a cogent historian External Data Reviewed: labs, radiology, ECG and notes.    Details: He has history of hypertension, pulmonary hypertension, rheumatoid arthritis, obesity, coronary artery disease and chronic back pain.  He has atrial fibrillation and takes Pradaxa.  He is a Restaurant manager, fast food.  Last hospitalization for cardiac problems was March 2023 when he was diuresed and noted to have chronic diastolic heart failure.  Last cardiac restratification procedure was in June 2021 when he had a nuclear stress test.  This was labeled an intermediate risk study.  Prior coronary CT angiogram in 2019, mild to  moderate coronary artery disease in the proximal and mid LAD. Labs: ordered.    Details: CBC, metabolic panel, troponin-normal except calcium low, hemoglobin low, troponin high high-sensitivity elevated Radiology: ordered and independent interpretation performed.    Details: Chest x-ray-no acute abnormalities  Risk Prescription drug management. Decision regarding hospitalization. Risk Details: Patient presenting with chest pain, clinical exam consistent with chest wall pain.  Evaluation for other problems include no evidence for ACS, no evidence for pneumonia, no evidence for PE.  He is stable for discharge with outpatient management.  Plan continued NSAID and use heat for discomfort.  PCP follow-up.           Final Clinical Impression(s) / ED Diagnoses Final diagnoses:  Chest wall pain  Rx / DC Orders ED Discharge Orders     None         Daleen Bo, MD 04/23/22 2223

## 2022-04-23 NOTE — ED Provider Triage Note (Signed)
Emergency Medicine Provider Triage Evaluation Note  Bryan [name] , a 76 y.o. male  was evaluated in triage.  Pt complains of left sided chest pain for the past 3 hours. Denies any shortness of breath, nausea, vomiting, diaphoresis, or lightheadedness.  Patient reports history of A-fib and takes a blood thinner.  Patient reports he has had this chest pain in the past but has not had it evaluated.  Review of Systems  Positive:  Negative:   Physical Exam  BP (!) 150/64 (BP Location: Left Arm)   Pulse 73   Temp 98.7 F (37.1 C) (Oral)   Resp 20   Ht 5' 5.5" (1.664 m)   Wt 91.2 kg   SpO2 100%   BMI 32.94 kg/m  Gen:   Awake, no distress   Resp:  Normal effort  MSK:   Moves extremities without difficulty  Other:  Irregular irregular rhythm  Medical Decision Making  Medically screening exam initiated at 11:32 AM.  Appropriate orders placed.  [name] was informed that the remainder of the evaluation will be completed by another provider, this initial triage assessment does not replace that evaluation, and the importance of remaining in the ED until their evaluation is complete.  Order chest pain work-up   Sherrell Puller, PA-C 04/23/22 1133

## 2022-04-23 NOTE — ED Notes (Signed)
Upon me entering exam room to provide discharge, pt and pt's wife was not found in room, pt's monitoring cables were removed.

## 2022-04-23 NOTE — ED Triage Notes (Signed)
Pt c/o severe CP that began one week ago. Was began on the right now on the left side of chest. +dizziness, shob.

## 2022-05-04 ENCOUNTER — Other Ambulatory Visit: Payer: Self-pay | Admitting: Neurosurgery

## 2022-05-04 DIAGNOSIS — M47819 Spondylosis without myelopathy or radiculopathy, site unspecified: Secondary | ICD-10-CM

## 2022-05-05 ENCOUNTER — Emergency Department (HOSPITAL_BASED_OUTPATIENT_CLINIC_OR_DEPARTMENT_OTHER)
Admission: EM | Admit: 2022-05-05 | Discharge: 2022-05-05 | Disposition: A | Discharge status: Home / Self Care | Payer: No Typology Code available for payment source | Attending: Emergency Medicine | Admitting: Emergency Medicine

## 2022-05-05 ENCOUNTER — Other Ambulatory Visit: Payer: Self-pay

## 2022-05-05 ENCOUNTER — Encounter (HOSPITAL_BASED_OUTPATIENT_CLINIC_OR_DEPARTMENT_OTHER): Payer: Self-pay

## 2022-05-05 DIAGNOSIS — Z859 Personal history of malignant neoplasm, unspecified: Secondary | ICD-10-CM | POA: Insufficient documentation

## 2022-05-05 DIAGNOSIS — Z87891 Personal history of nicotine dependence: Secondary | ICD-10-CM | POA: Insufficient documentation

## 2022-05-05 DIAGNOSIS — I129 Hypertensive chronic kidney disease with stage 1 through stage 4 chronic kidney disease, or unspecified chronic kidney disease: Secondary | ICD-10-CM | POA: Insufficient documentation

## 2022-05-05 DIAGNOSIS — I2510 Atherosclerotic heart disease of native coronary artery without angina pectoris: Secondary | ICD-10-CM | POA: Insufficient documentation

## 2022-05-05 DIAGNOSIS — E1122 Type 2 diabetes mellitus with diabetic chronic kidney disease: Secondary | ICD-10-CM | POA: Diagnosis not present

## 2022-05-05 DIAGNOSIS — E876 Hypokalemia: Secondary | ICD-10-CM | POA: Insufficient documentation

## 2022-05-05 DIAGNOSIS — N189 Chronic kidney disease, unspecified: Secondary | ICD-10-CM | POA: Diagnosis not present

## 2022-05-05 DIAGNOSIS — Z79899 Other long term (current) drug therapy: Secondary | ICD-10-CM | POA: Diagnosis not present

## 2022-05-05 DIAGNOSIS — R799 Abnormal finding of blood chemistry, unspecified: Secondary | ICD-10-CM | POA: Diagnosis present

## 2022-05-05 LAB — BASIC METABOLIC PANEL
Anion gap: 9 (ref 5–15)
BUN: 16 mg/dL (ref 8–23)
CO2: 29 mmol/L (ref 22–32)
Calcium: 9.2 mg/dL (ref 8.9–10.3)
Chloride: 104 mmol/L (ref 98–111)
Creatinine, Ser: 1.25 mg/dL — ABNORMAL HIGH (ref 0.61–1.24)
GFR, Estimated: 59 mL/min — ABNORMAL LOW (ref >60)
Glucose, Bld: 88 mg/dL (ref 70–99)
Potassium: 3.2 mmol/L — ABNORMAL LOW (ref 3.5–5.1)
Sodium: 142 mmol/L (ref 135–145)

## 2022-05-05 LAB — CBC WITH DIFFERENTIAL/PLATELET
Abs Immature Granulocytes: 0.02 10*3/uL (ref 0.00–0.07)
Basophils Absolute: 0 10*3/uL (ref 0.0–0.1)
Basophils Relative: 0 %
Eosinophils Absolute: 0.1 10*3/uL (ref 0.0–0.5)
Eosinophils Relative: 2 %
HCT: 34.3 % — ABNORMAL LOW (ref 39.0–52.0)
Hemoglobin: 10.8 g/dL — ABNORMAL LOW (ref 13.0–17.0)
Immature Granulocytes: 0 %
Lymphocytes Relative: 23 %
Lymphs Abs: 1.3 10*3/uL (ref 0.7–4.0)
MCH: 28.9 pg (ref 26.0–34.0)
MCHC: 31.5 g/dL (ref 30.0–36.0)
MCV: 91.7 fL (ref 80.0–100.0)
Monocytes Absolute: 0.5 10*3/uL (ref 0.1–1.0)
Monocytes Relative: 10 %
Neutro Abs: 3.7 10*3/uL (ref 1.7–7.7)
Neutrophils Relative %: 65 %
Platelets: 311 10*3/uL (ref 150–400)
RBC: 3.74 MIL/uL — ABNORMAL LOW (ref 4.22–5.81)
RDW: 16.1 % — ABNORMAL HIGH (ref 11.5–15.5)
WBC: 5.7 10*3/uL (ref 4.0–10.5)
nRBC: 0 % (ref 0.0–0.2)

## 2022-05-05 MED ORDER — POTASSIUM CHLORIDE CRYS ER 20 MEQ PO TBCR
40.0000 meq | EXTENDED_RELEASE_TABLET | Freq: Once | ORAL | Status: AC
  Administered 2022-05-05: 40 meq via ORAL
  Filled 2022-05-05: qty 2

## 2022-05-05 MED ORDER — POTASSIUM CHLORIDE ER 10 MEQ PO TBCR: 20.0000 meq | EXTENDED_RELEASE_TABLET | Freq: Every day | ORAL | 0 refills | Status: DC

## 2022-05-05 NOTE — ED Triage Notes (Signed)
POV, pt sts that nurse called him this morning for follow up in regard to lab tests and potassium was below 2.0, pt has no symptoms, alert and oriented x 4,ambulatory

## 2022-05-09 ENCOUNTER — Telehealth: Payer: Self-pay | Admitting: Student

## 2022-05-09 NOTE — Telephone Encounter (Signed)
Bryan [name] w/[facility] is calling for patient about some medication. Please call her back at [phone].

## 2022-05-11 ENCOUNTER — Other Ambulatory Visit: Payer: Self-pay

## 2022-05-11 MED ORDER — FUROSEMIDE 20 MG PO TABS: 20.0000 mg | ORAL_TABLET | Freq: Every day | ORAL | 2 refills | Status: DC

## 2022-05-11 MED ORDER — DILTIAZEM HCL ER COATED BEADS 120 MG PO CP24: 120.0000 mg | ORAL_CAPSULE | Freq: Every day | ORAL | 2 refills | Status: DC

## 2022-05-11 NOTE — Telephone Encounter (Signed)
done

## 2022-05-22 ENCOUNTER — Telehealth: Payer: Self-pay | Admitting: Hematology

## 2022-05-22 NOTE — Telephone Encounter (Signed)
Rescheduled upcoming appointment due to provider's template. Patient is aware of changes. Mailed calendar.

## 2022-05-23 ENCOUNTER — Institutional Professional Consult (permissible substitution): Payer: No Typology Code available for payment source | Admitting: Cardiology

## 2022-05-31 ENCOUNTER — Ambulatory Visit
Admission: RE | Admit: 2022-05-31 | Discharge: 2022-05-31 | Disposition: A | Discharge status: Home / Self Care | Payer: No Typology Code available for payment source | Source: Ambulatory Visit | Attending: Neurosurgery | Admitting: Neurosurgery

## 2022-05-31 DIAGNOSIS — M47819 Spondylosis without myelopathy or radiculopathy, site unspecified: Secondary | ICD-10-CM

## 2022-06-05 ENCOUNTER — Ambulatory Visit: Payer: Medicare HMO | Admitting: Student

## 2022-06-27 ENCOUNTER — Other Ambulatory Visit: Payer: Self-pay

## 2022-06-27 ENCOUNTER — Other Ambulatory Visit: Payer: Self-pay | Admitting: Neurosurgery

## 2022-06-27 DIAGNOSIS — S32009K Unspecified fracture of unspecified lumbar vertebra, subsequent encounter for fracture with nonunion: Secondary | ICD-10-CM | POA: Insufficient documentation

## 2022-06-27 DIAGNOSIS — M4802 Spinal stenosis, cervical region: Secondary | ICD-10-CM | POA: Insufficient documentation

## 2022-06-27 DIAGNOSIS — G4730 Sleep apnea, unspecified: Secondary | ICD-10-CM | POA: Insufficient documentation

## 2022-06-27 DIAGNOSIS — M5416 Radiculopathy, lumbar region: Secondary | ICD-10-CM | POA: Insufficient documentation

## 2022-06-27 DIAGNOSIS — M79643 Pain in unspecified hand: Secondary | ICD-10-CM | POA: Insufficient documentation

## 2022-06-27 DIAGNOSIS — Z01810 Encounter for preprocedural cardiovascular examination: Secondary | ICD-10-CM | POA: Insufficient documentation

## 2022-06-27 DIAGNOSIS — Z461 Encounter for fitting and adjustment of hearing aid: Secondary | ICD-10-CM | POA: Insufficient documentation

## 2022-06-27 DIAGNOSIS — M7710 Lateral epicondylitis, unspecified elbow: Secondary | ICD-10-CM | POA: Insufficient documentation

## 2022-06-27 DIAGNOSIS — R197 Diarrhea, unspecified: Secondary | ICD-10-CM | POA: Insufficient documentation

## 2022-06-27 DIAGNOSIS — M51379 Other intervertebral disc degeneration, lumbosacral region without mention of lumbar back pain or lower extremity pain: Secondary | ICD-10-CM | POA: Insufficient documentation

## 2022-06-27 DIAGNOSIS — I4891 Unspecified atrial fibrillation: Secondary | ICD-10-CM | POA: Insufficient documentation

## 2022-06-27 DIAGNOSIS — H903 Sensorineural hearing loss, bilateral: Secondary | ICD-10-CM | POA: Insufficient documentation

## 2022-06-27 DIAGNOSIS — G4733 Obstructive sleep apnea (adult) (pediatric): Secondary | ICD-10-CM | POA: Insufficient documentation

## 2022-06-27 DIAGNOSIS — E663 Overweight: Secondary | ICD-10-CM | POA: Insufficient documentation

## 2022-06-27 DIAGNOSIS — M1711 Unilateral primary osteoarthritis, right knee: Secondary | ICD-10-CM | POA: Insufficient documentation

## 2022-06-27 DIAGNOSIS — I509 Heart failure, unspecified: Secondary | ICD-10-CM | POA: Insufficient documentation

## 2022-06-27 DIAGNOSIS — M5137 Other intervertebral disc degeneration, lumbosacral region: Secondary | ICD-10-CM | POA: Insufficient documentation

## 2022-06-27 DIAGNOSIS — I4819 Other persistent atrial fibrillation: Secondary | ICD-10-CM | POA: Insufficient documentation

## 2022-06-27 DIAGNOSIS — C884 Extranodal marginal zone B-cell lymphoma of mucosa-associated lymphoid tissue [MALT-lymphoma]: Secondary | ICD-10-CM | POA: Insufficient documentation

## 2022-06-27 DIAGNOSIS — Z77098 Contact with and (suspected) exposure to other hazardous, chiefly nonmedicinal, chemicals: Secondary | ICD-10-CM | POA: Insufficient documentation

## 2022-06-27 DIAGNOSIS — M25561 Pain in right knee: Secondary | ICD-10-CM | POA: Insufficient documentation

## 2022-06-27 DIAGNOSIS — Z23 Encounter for immunization: Secondary | ICD-10-CM | POA: Insufficient documentation

## 2022-06-27 DIAGNOSIS — R251 Tremor, unspecified: Secondary | ICD-10-CM | POA: Insufficient documentation

## 2022-06-27 DIAGNOSIS — M25519 Pain in unspecified shoulder: Secondary | ICD-10-CM | POA: Insufficient documentation

## 2022-06-27 DIAGNOSIS — C8584 Other specified types of non-Hodgkin lymphoma, lymph nodes of axilla and upper limb: Secondary | ICD-10-CM | POA: Insufficient documentation

## 2022-06-27 DIAGNOSIS — H9193 Unspecified hearing loss, bilateral: Secondary | ICD-10-CM | POA: Insufficient documentation

## 2022-06-27 DIAGNOSIS — Z0289 Encounter for other administrative examinations: Secondary | ICD-10-CM | POA: Insufficient documentation

## 2022-06-27 DIAGNOSIS — N529 Male erectile dysfunction, unspecified: Secondary | ICD-10-CM | POA: Insufficient documentation

## 2022-06-27 DIAGNOSIS — M5412 Radiculopathy, cervical region: Secondary | ICD-10-CM | POA: Insufficient documentation

## 2022-06-27 DIAGNOSIS — I10 Essential (primary) hypertension: Secondary | ICD-10-CM | POA: Insufficient documentation

## 2022-07-12 ENCOUNTER — Ambulatory Visit: Payer: Medicare HMO | Admitting: Cardiology

## 2022-07-12 ENCOUNTER — Encounter: Payer: Self-pay | Admitting: Cardiology

## 2022-07-12 VITALS — BP 166/77 | HR 54 | Temp 98.0°F | Resp 16 | Ht 65.0 in | Wt 216.0 lb

## 2022-07-12 DIAGNOSIS — I10 Essential (primary) hypertension: Secondary | ICD-10-CM

## 2022-07-12 DIAGNOSIS — Z01810 Encounter for preprocedural cardiovascular examination: Secondary | ICD-10-CM

## 2022-07-12 DIAGNOSIS — Z01818 Encounter for other preprocedural examination: Secondary | ICD-10-CM

## 2022-07-12 MED ORDER — HYDRALAZINE HCL 50 MG PO TABS: 50.0000 mg | ORAL_TABLET | Freq: Two times a day (BID) | ORAL | 3 refills | Status: DC

## 2022-07-12 NOTE — Progress Notes (Signed)
Patient referred by Bryan [name]., FNP for precordial pain  Subjective:   Bryan [name], male    DOB: 1946-10-17, 76 y.o.   MRN: [id number]   Chief Complaint  Patient presents with   Hypertension   Medical Clearance    Back surgery     HPI  76 y.o. African-American male, Bryan [name] witness, with stage IIb marginal zone lymphoma, rheumatoid arthritis, anemia, CAD with mild ischemia (04/2020), paroxysmal atrial flutter, h/o post-op PE-currently on Pradaxa, pulmonary hypertension.  I last saw the patient during hospitalization in 01/2022. He is now having debilitating back pain and is going to undergo L5-S1 ALIF by Bryan [name]. From cardiac standpoint, he is doing well and denies any chest pain, dyspnea. Blood pressure is elevated today.  OV 03/2020: Patient was evaluated by Bryan [name] in 2019.  CT coronary angiogram at that time showed mild to moderate nonobstructive disease in LAD.  On 02/22/2020, patient went to [facility] emergency department with complaints of chest pain.  Episode of chest pain was retrosternal, burning, woke him up middle of night, and lasted for several hours. Patient went to [facility] ED. Workup showed CTA negative for PE. HS trop was mildly elevated but flat (25, 23). He has not had any recurrent chest pain since then. Patient is aware of extra beats that have been going on for a long time, and is unchanged. He does not do any regular physical activity at baseline.  Patient is on rituximab for rheumatoid arthritis and lymphoma, which has reportedly been stable.    Current Outpatient Medications:    amoxicillin (AMOXIL) 500 MG capsule, Take 500 mg by mouth 3 (three) times daily., Disp: , Rfl:    clarithromycin (BIAXIN) 500 MG tablet, Take 500 mg by mouth 2 (two) times daily., Disp: , Rfl:    dabigatran (PRADAXA) 150 MG CAPS capsule, Take 1 capsule (150 mg total) by mouth 2 (two) times daily., Disp: 60 capsule, Rfl: 3   diltiazem (CARDIZEM CD) 120 MG 24 hr  capsule, Take 1 capsule (120 mg total) by mouth daily., Disp: 90 capsule, Rfl: 2   furosemide (LASIX) 20 MG tablet, Take 1 tablet (20 mg total) by mouth daily., Disp: 90 tablet, Rfl: 2   metoprolol succinate (TOPROL-XL) 100 MG 24 hr tablet, TAKE 1 TABLET BY MOUTH ONCE DAILY WITH  OR  IMMEDIATELY  FOLLOWING  A  MEAL (Patient taking differently: 100 mg 2 (two) times daily.), Disp: 90 tablet, Rfl: 2   nitroGLYCERIN (NITROSTAT) 0.4 MG SL tablet, Place 0.4 mg under the tongue every 5 (five) minutes as needed for chest pain., Disp: , Rfl:    PRIMIDONE PO, Take 150 mg by mouth in the morning and at bedtime. 147m morning/1579mbedtime, Disp: , Rfl:    Tamsulosin HCl (FLOMAX) 0.4 MG CAPS, Take 0.4 mg by mouth at bedtime., Disp: , Rfl:    traMADol-acetaminophen (ULTRACET) 37.5-325 MG tablet, Take 1 tablet by mouth at bedtime., Disp: , Rfl:    potassium chloride (KLOR-CON) 10 MEQ tablet, Take 2 tablets (20 mEq total) by mouth daily for 7 days., Disp: 14 tablet, Rfl: 0  Cardiovascular and other pertinent studies:  EKG 07/12/2022: Sinus rhythm 53 bpm Right bundle branch block  Echocardiogram 01/20/2022:  1. Left ventricular ejection fraction, by estimation, is 55 to 60%. The  left ventricle has normal function. There is the interventricular septum  is flattened in systole, consistent with right ventricular pressure  overload.   2. Right ventricular systolic  function is normal. The right ventricular  size is mildly enlarged. There is severely elevated pulmonary artery  systolic pressure 63 mmHg  3. Tricuspid valve regurgitation is moderate.   4. The aortic valve is tricuspid. Aortic valve regurgitation is not  visualized.   5. The inferior vena cava is normal in size with greater than 50%  respiratory variability, suggesting right atrial pressure of 3 mmHg.   6. No significant change compared to previous study in 09/2021.   Event monitor 04/28/2020 - 05/11/2020: Diagnostic time: 74%  Dominant rhythm:  Sinus. HR 52-109 bpm. Avg HR 68 bpm. 5 beat NSVT at 2:44 am CT on 04/28/2020.  Recurring episodes of atrial tachycardia <30 sec on 05/06/2020 10:14 AM CT No atrial fibrillation/atrial flutter/SVT/VT/high grade AV block, sinus pause >3sec noted. Symptoms reported: None  Lexiscan Tetrofosmin stress test 04/13/2020: Lexiscan/modified Bruce nuclear stress test performed using 1-day protocol. Stress EKG is non-diagnostic, as this is pharmacological stress test. In addition, stress EKG at 67% MPHR showed sinus tachycardia, RBBB, LAFB.  SPECT images show small sized, mild intensity, reversible perfusion defect in apical anteroseptal myocardium. Stress LVEF 42%. Intermediate risk study.   EKG 03/25/2020: Sinus rhythm 84 bpm Right bundle branch block.  Frequent PAC s   CTA chest 02/2020: Negative for PE  Coronary CT angiogram 07/2018: 1. Coronary calcium score of 21. This was 67 percentile for age and sex matched control. 2. Normal coronary origin with right dominance. 3. There is mild to moderate CAD in the proximal and mid LAD. Aggressive risk factor modification is recommended.   Recent labs: 05/05/2022: Glucose 88, BUN/Cr 16/1.25. EGFR 59. Na/K 142/3.2.  H/H 10.8/34.3. MCV 91. Platelets 311  01/2022: HbA1C 5.8%  03/04/2020: Glucose 80, BUN/Cr 18/1.25. EGFR >60. Na/K 147/3.7.  H/H 10.8/34.6. MCV 93.8. Platelets 257 HbA1C N/A  2018: Chol 168, TG 65, HDL 69, LDL 86 TSH normal    Review of Systems  Cardiovascular:  Positive for chest pain. Negative for dyspnea on exertion, leg swelling, palpitations and syncope.        Vitals:   07/12/22 1237 07/12/22 1246  BP: (!) 176/73 (!) 166/77  Pulse: (!) 55 (!) 54  Resp: 16   Temp: 98 F (36.7 C)   SpO2: 96% 96%     Body mass index is 35.94 kg/m. Filed Weights   07/12/22 1237  Weight: 216 lb (98 kg)     Objective:   Physical Exam Vitals and nursing note reviewed.  Constitutional:      General: He is not in acute  distress. Neck:     Vascular: No JVD.  Cardiovascular:     Rate and Rhythm: Normal rate and regular rhythm.     Pulses: Intact distal pulses.     Heart sounds: Normal heart sounds. No murmur heard. Pulmonary:     Effort: Pulmonary effort is normal.     Breath sounds: Normal breath sounds. No wheezing or rales.         Assessment & Recommendations:   77 y.o. African-American male, Bryan [name] witness, with stage IIb marginal zone lymphoma, rheumatoid arthritis, anemia, CAD with mild ischemia (04/2020), paroxysmal atrial flutter, h/o post-op PE-currently on Pradaxa, pulmonary hypertension.  CAD: Mild prox CAD CCTA 2019. Mild anteroseptal ischemia stress test 04/2020. In light of his controlled symptoms on metoprolol, continue the same. Not on Aspirin due to ongoing use of Pradaxa for atrial flutter, PE.  Moderate perioperative cardiac risk. Given orior pulmonary hypertension in the setting of PE, recommend echcoardiogram to  reassess  Paroxysmal atrial flutter: CHA2DS2-VASc Score 4 (CHF, Vasc, A) and yearly risk of stroke 4.8, currently on Pradaxa.  F/u after echocardiogram.   Nigel Mormon, MD Pager: [phone] Office: [phone]

## 2022-07-13 ENCOUNTER — Ambulatory Visit: Payer: Medicare HMO | Admitting: Cardiology

## 2022-07-17 ENCOUNTER — Ambulatory Visit: Payer: Medicare HMO

## 2022-07-17 DIAGNOSIS — Z01810 Encounter for preprocedural cardiovascular examination: Secondary | ICD-10-CM

## 2022-07-17 DIAGNOSIS — I10 Essential (primary) hypertension: Secondary | ICD-10-CM

## 2022-07-20 NOTE — Pre-Procedure Instructions (Signed)
Surgical Instructions    Your procedure is scheduled on Monday, September 18th.  Report to [facility] Main Entrance "A" at 5:30 A.M., then check in with the Admitting office.  Call this number if you have problems the morning of surgery:  [phone]   If you have any questions prior to your surgery date call [phone]: Open Monday-Friday 8am-4pm    Remember:  Do not eat or drink after midnight the night before your surgery    Take these medicines the morning of surgery with A SIP OF WATER  diltiazem (CARDIZEM CD) hydrALAZINE (APRESOLINE) metoprolol (TOPROL-XL)  primidone (MYSOLINE) acetaminophen (TYLENOL)-as needed  Follow your surgeon's instructions on when to stop dabigatran (PRADAXA).  If no instructions were given by your surgeon then you will need to call the office to get those instructions.    As of today, STOP taking any Aspirin (unless otherwise instructed by your surgeon) Aleve, Naproxen, Ibuprofen, Motrin, Advil, Goody's, BC's, all herbal medications, fish oil, and all vitamins.                     Do NOT Smoke (Tobacco/Vaping) for 24 hours prior to your procedure.  If you use a CPAP at night, you may bring your mask/headgear for your overnight stay.   Contacts, glasses, piercing's, hearing aid's, dentures or partials may not be worn into surgery, please bring cases for these belongings.    For patients admitted to the hospital, discharge time will be determined by your treatment team.   Patients discharged the day of surgery will not be allowed to drive home, and someone needs to stay with them for 24 hours.  SURGICAL WAITING ROOM VISITATION Patients having surgery or a procedure may have no more than 2 support people in the waiting area - these visitors may rotate.   Children under the age of 67 must have an adult with them who is not the patient. If the patient needs to stay at the hospital during part of their recovery, the visitor guidelines for inpatient  rooms apply. Pre-op nurse will coordinate an appropriate time for 1 support person to accompany patient in pre-op.  This support person may not rotate.   Please refer to the [facility] website for the visitor guidelines for Inpatients (after your surgery is over and you are in a regular room).    Special instructions:   [facility]- Preparing For Surgery  Before surgery, you can play an important role. Because skin is not sterile, your skin needs to be as free of germs as possible. You can reduce the number of germs on your skin by washing with CHG (chlorahexidine gluconate) Soap before surgery.  CHG is an antiseptic cleaner which kills germs and bonds with the skin to continue killing germs even after washing.    Oral Hygiene is also important to reduce your risk of infection.  Remember - BRUSH YOUR TEETH THE MORNING OF SURGERY WITH YOUR REGULAR TOOTHPASTE  Please do not use if you have an allergy to CHG or antibacterial soaps. If your skin becomes reddened/irritated stop using the CHG.  Do not shave (including legs and underarms) for at least 48 hours prior to first CHG shower. It is OK to shave your face.  Please follow these instructions carefully.   Shower the NIGHT BEFORE SURGERY and the MORNING OF SURGERY  If you chose to wash your hair, wash your hair first as usual with your normal shampoo.  After you shampoo, rinse your hair and  body thoroughly to remove the shampoo.  Use CHG Soap as you would any other liquid soap. You can apply CHG directly to the skin and wash gently with a scrungie or a clean washcloth.   Apply the CHG Soap to your body ONLY FROM THE NECK DOWN.  Do not use on open wounds or open sores. Avoid contact with your eyes, ears, mouth and genitals (private parts). Wash Face and genitals (private parts)  with your normal soap.   Wash thoroughly, paying special attention to the area where your surgery will be performed.  Thoroughly rinse your body with warm water  from the neck down.  DO NOT shower/wash with your normal soap after using and rinsing off the CHG Soap.  Pat yourself dry with a CLEAN TOWEL.  Wear CLEAN PAJAMAS to bed the night before surgery  Place CLEAN SHEETS on your bed the night before your surgery  DO NOT SLEEP WITH PETS.   Day of Surgery: Take a shower with CHG soap. Do not wear jewelry Do not wear lotions, powders, colognes, or deodorant. Men may shave face and neck. Do not bring valuables to the hospital.  [facility] is not responsible for any belongings or valuables. Wear Clean/Comfortable clothing the morning of surgery Remember to brush your teeth WITH YOUR REGULAR TOOTHPASTE.   Please read over the following fact sheets that you were given.    If you received a COVID test during your pre-op visit  it is requested that you wear a mask when out in public, stay away from anyone that may not be feeling well and notify your surgeon if you develop symptoms. If you have been in contact with anyone that has tested positive in the last 10 days please notify you surgeon.

## 2022-07-23 ENCOUNTER — Encounter (HOSPITAL_COMMUNITY): Payer: Self-pay

## 2022-07-23 ENCOUNTER — Encounter (HOSPITAL_COMMUNITY)
Admission: RE | Admit: 2022-07-23 | Discharge: 2022-07-23 | Disposition: A | Discharge status: Home / Self Care | Payer: No Typology Code available for payment source | Source: Ambulatory Visit | Attending: Neurosurgery | Admitting: Neurosurgery

## 2022-07-23 ENCOUNTER — Other Ambulatory Visit: Payer: Self-pay

## 2022-07-23 VITALS — BP 144/60 | HR 59 | Temp 98.3°F | Resp 17 | Ht 65.0 in | Wt 215.4 lb

## 2022-07-23 DIAGNOSIS — Z01818 Encounter for other preprocedural examination: Secondary | ICD-10-CM | POA: Diagnosis not present

## 2022-07-23 DIAGNOSIS — I10 Essential (primary) hypertension: Secondary | ICD-10-CM | POA: Diagnosis not present

## 2022-07-23 LAB — BASIC METABOLIC PANEL
Anion gap: 6 (ref 5–15)
BUN: 17 mg/dL (ref 8–23)
CO2: 28 mmol/L (ref 22–32)
Calcium: 8.5 mg/dL — ABNORMAL LOW (ref 8.9–10.3)
Chloride: 107 mmol/L (ref 98–111)
Creatinine, Ser: 1.56 mg/dL — ABNORMAL HIGH (ref 0.61–1.24)
GFR, Estimated: 45 mL/min — ABNORMAL LOW (ref >60)
Glucose, Bld: 103 mg/dL — ABNORMAL HIGH (ref 70–99)
Potassium: 3.6 mmol/L (ref 3.5–5.1)
Sodium: 141 mmol/L (ref 135–145)

## 2022-07-23 LAB — NO BLOOD PRODUCTS

## 2022-07-23 LAB — CBC
HCT: 36.4 % — ABNORMAL LOW (ref 39.0–52.0)
Hemoglobin: 11.7 g/dL — ABNORMAL LOW (ref 13.0–17.0)
MCH: 31.2 pg (ref 26.0–34.0)
MCHC: 32.1 g/dL (ref 30.0–36.0)
MCV: 97.1 fL (ref 80.0–100.0)
Platelets: 232 10*3/uL (ref 150–400)
RBC: 3.75 MIL/uL — ABNORMAL LOW (ref 4.22–5.81)
RDW: 16 % — ABNORMAL HIGH (ref 11.5–15.5)
WBC: 4.9 10*3/uL (ref 4.0–10.5)
nRBC: 0 % (ref 0.0–0.2)

## 2022-07-23 LAB — SURGICAL PCR SCREEN
MRSA, PCR: NEGATIVE
Staphylococcus aureus: NEGATIVE

## 2022-07-23 NOTE — Progress Notes (Signed)
PCP - Viviann Spare, FNP Cardiologist - Dr. Virgina Jock  PPM/ICD - n/a  Chest x-ray - 04/23/22 EKG - 07/12/22 Stress Test - 04/13/20 ECHO - 07/18/22 Cardiac Cath -denies   Sleep Study - OSA+ CPAP - does not use consistently  Pt denies being diabetic. Pt is not on any medication for diabetes.   Blood Thinner Instructions: Pradaxa, no instructions given. Pt has an appt with Dr. Virgina Jock on 9/14 and will clarify during that appt.  Aspirin Instructions: n/a  NPO  COVID TEST- denies  Anesthesia review: Yes, cardiac clearance pending. Pt has a f/u appt on 9/14 for clearance and f/u of echo results.   Patient denies shortness of breath, fever, cough and chest pain at PAT appointment   All instructions explained to the patient, with a verbal understanding of the material. Patient agrees to go over the instructions while at home for a better understanding. Patient also instructed to self quarantine after being tested for COVID-19. The opportunity to ask questions was provided.

## 2022-07-24 ENCOUNTER — Encounter: Payer: Self-pay | Admitting: Vascular Surgery

## 2022-07-24 ENCOUNTER — Ambulatory Visit (INDEPENDENT_AMBULATORY_CARE_PROVIDER_SITE_OTHER): Payer: No Typology Code available for payment source | Admitting: Vascular Surgery

## 2022-07-24 VITALS — BP 151/69 | HR 55 | Temp 97.4°F | Resp 16 | Ht 65.5 in | Wt 216.0 lb

## 2022-07-24 DIAGNOSIS — M5136 Other intervertebral disc degeneration, lumbar region: Secondary | ICD-10-CM | POA: Diagnosis not present

## 2022-07-24 NOTE — Progress Notes (Signed)
Patient name: Bryan [name] MRN: [id number] DOB: 1946-06-21 Sex: male  REASON FOR CONSULT: Evaluate for abdominal exposure for L5-S1 ALIF  HPI: Bryan [name] is a 76 y.o. male, with history of hypertension and diabetes that presents for evaluation of anterior spine exposure for L5-S1 ALIF.  Patient describes chronic debilitating lower back pain.  He has been evaluated Dr. Saintclair Halsted who has recommended an L5-S1 ALIF and asked vascular surgery to assist with abdominal exposure.  Previous abdominal surgery includes laparoscopic cholecystectomy.  Imaging shows he had previous posterior fusion from L2-S1.  Past Medical History:  Diagnosis Date   Anemia    Arthritis    hands, knees, cervical area. Back pain. Rheumatoid arthritis- weekly injections.   BPH (benign prostatic hypertrophy)    Cancer (HCC)    Diabetes (HCC)    Essential tremor    GAD (generalized anxiety disorder) 12/05/2021   GERD (gastroesophageal reflux disease)    reports for indigestion he uses mustard    Hearing deficit    wears hearing aids bilateral   HTN (hypertension)    Obesity    OSA (obstructive sleep apnea)    Refusal of blood transfusions as patient is Jehovah's Witness    Rheumatoid arthritis ([place])    Right bundle branch block    history of   Seizures ([place])    AS A CHILD. only esential tremors now.   Sleep apnea    cpap - settings at 9 per patient    Ulcer     Past Surgical History:  Procedure Laterality Date   ANTERIOR CERVICAL DECOMP/DISCECTOMY FUSION N/A 03/30/2013   Procedure: ANTERIOR CERVICAL DECOMPRESSION/DISCECTOMY FUSION 2 LEVELS;  Surgeon: Otilio Connors, MD;  Location: [facility];  Service: Neurosurgery;  Laterality: N/A;  C4-5 C5-6 Anterior cervical decompression/diskectomy/fusion/Allograft/Plate   CHOLECYSTECTOMY N/A 12/07/2013   Procedure: LAPAROSCOPIC CHOLECYSTECTOMY WITH INTRAOPERATIVE CHOLANGIOGRAM;  Surgeon: Adin Hector, MD;  Location: [facility];  Service: General;  Laterality: N/A;    COLONOSCOPY     DECOMPRESSIVE LUMBAR LAMINECTOMY LEVEL 2 N/A 02/15/2015   Procedure: COMPLETE DECOMPRESSIVE LUMBAR LAMINECTOMY L4-L5/ FORAMINOTOMY TO L4 NERVE ROOT AND L5 NERVE ROOT BILATERALLY;  Surgeon: [name], MD;  Location: [facility];  Service: Orthopedics;  Laterality: N/A;   EYE SURGERY     right, growth excision   LUMBAR LAMINECTOMY/DECOMPRESSION MICRODISCECTOMY Left 01/18/2016   Procedure:  DECOMPRESSION L4-L5 MICRODISCECTOMY L4-L5 ON LEFT FOR SPINAL STENOSIS;  Surgeon: [name], MD;  Location: [facility];  Service: Orthopedics;  Laterality: Left;   PAROTIDECTOMY Left 01/24/2018   Procedure: INCISIONAL BIOPSY OF LEFT PAROTID    MASS;  Surgeon: Helayne Seminole, MD;  Location: [facility];  Service: ENT;  Laterality: Left;   SPINE SURGERY     TONSILLECTOMY     VASECTOMY     WRIST GANGLION EXCISION Left     Family History  Problem Relation Age of Onset   Colon cancer Maternal Grandfather    Kidney disease Brother    Hypertension Brother    Diabetes Brother    Stroke Maternal Grandmother    Diabetes Mother    Hypertension Mother    CAD Father        died of MI at age 54   Hypertension Father    Diabetes Paternal Aunt        x 4 aunts    SOCIAL HISTORY: Social History   Socioeconomic History   Marital status: Married    Spouse name: Not on file  Number of children: 2   Years of education: Not on file   Highest education level: Not on file  Occupational History   Occupation: bus driver    Employer: RETIRED  Tobacco Use   Smoking status: Former    Packs/day: 0.50    Years: 14.00    Total pack years: 7.00    Types: Cigarettes    Quit date: 12/14/1972    Years since quitting: 49.6   Smokeless tobacco: Never  Vaping Use   Vaping Use: Never used  Substance and Sexual Activity   Alcohol use: Yes    Comment: occasionally   Drug use: No   Sexual activity: Yes    Birth control/protection: None  Other Topics Concern   Not on file  Social History Narrative    Lives with wife   Social Determinants of Health   Financial Resource Strain: Not on file  Food Insecurity: Not on file  Transportation Needs: Not on file  Physical Activity: Not on file  Stress: Not on file  Social Connections: Not on file  Intimate Partner Violence: Not on file    Allergies  Allergen Reactions   Terazosin Other (See Comments)    Hypotension   Adalimumab Other (See Comments)    (Humira) Lymphoma   Statins Other (See Comments)    Memory issues   Ampicillin Nausea Only   Lisinopril Cough   Other Other (See Comments)    BLOOD PRODUCT REFUSAL (patient is a Sales promotion account executive Witness)     Current Outpatient Medications  Medication Sig Dispense Refill   dabigatran (PRADAXA) 150 MG CAPS capsule Take 1 capsule (150 mg total) by mouth 2 (two) times daily. 60 capsule 3   diltiazem (CARDIZEM CD) 120 MG 24 hr capsule Take 1 capsule (120 mg total) by mouth daily. 90 capsule 2   furosemide (LASIX) 20 MG tablet Take 1 tablet (20 mg total) by mouth daily. 90 tablet 2   metoprolol (TOPROL-XL) 200 MG 24 hr tablet Take 200 mg by mouth daily.     primidone (MYSOLINE) 50 MG tablet Take 150 mg by mouth 2 (two) times daily.     Tamsulosin HCl (FLOMAX) 0.4 MG CAPS Take 0.4 mg by mouth at bedtime.     acetaminophen (TYLENOL) 500 MG tablet Take 1,000 mg by mouth daily as needed for moderate pain. (Patient not taking: Reported on 07/24/2022)     hydrALAZINE (APRESOLINE) 50 MG tablet Take 1 tablet (50 mg total) by mouth in the morning and at bedtime. (Patient not taking: Reported on 07/24/2022) 60 tablet 3   LACTOBACILLUS PO Take 1 capsule by mouth 2 (two) times daily. (Patient not taking: Reported on 07/24/2022)     metoprolol succinate (TOPROL-XL) 100 MG 24 hr tablet TAKE 1 TABLET BY MOUTH ONCE DAILY WITH  OR  IMMEDIATELY  FOLLOWING  A  MEAL (Patient not taking: Reported on 07/18/2022) 90 tablet 2   Multiple Vitamin (MULTIVITAMIN WITH MINERALS) TABS tablet Take 1 tablet by mouth daily. (Patient not  taking: Reported on 07/24/2022)     nitroGLYCERIN (NITROSTAT) 0.4 MG SL tablet Place 0.4 mg under the tongue every 5 (five) minutes as needed for chest pain. (Patient not taking: Reported on 07/24/2022)     potassium chloride (KLOR-CON) 10 MEQ tablet Take 2 tablets (20 mEq total) by mouth daily for 7 days. (Patient not taking: Reported on 07/18/2022) 14 tablet 0   No current facility-administered medications for this visit.    REVIEW OF SYSTEMS:  '[X]'$  denotes positive  finding, '[ ]'$  denotes negative finding Cardiac  Comments:  Chest pain or chest pressure:    Shortness of breath upon exertion:    Short of breath when lying flat:    Irregular heart rhythm:        Vascular    Pain in calf, thigh, or hip brought on by ambulation:    Pain in feet at night that wakes you up from your sleep:     Blood clot in your veins:    Leg swelling:         Pulmonary    Oxygen at home:    Productive cough:     Wheezing:         Neurologic    Sudden weakness in arms or legs:     Sudden numbness in arms or legs:     Sudden onset of difficulty speaking or slurred speech:    Temporary loss of vision in one eye:     Problems with dizziness:         Gastrointestinal    Blood in stool:     Vomited blood:         Genitourinary    Burning when urinating:     Blood in urine:        Psychiatric    Major depression:         Hematologic    Bleeding problems:    Problems with blood clotting too easily:        Skin    Rashes or ulcers:        Constitutional    Fever or chills:      PHYSICAL EXAM: Vitals:   07/24/22 1548  BP: (!) 151/69  Pulse: (!) 55  Resp: 16  Temp: (!) 97.4 F (36.3 C)  TempSrc: Temporal  SpO2: 93%  Weight: 216 lb (98 kg)  Height: 5' 5.5" (1.664 m)    GENERAL: The patient is a well-nourished male, in no acute distress. The vital signs are documented above. CARDIAC: There is a regular rate and rhythm.  VASCULAR:  Palpable femoral pulses bilaterally Right DP  palpable Left PT palpable PULMONARY: No respiratory distress. ABDOMEN: Soft and non-tender. MUSCULOSKELETAL: There are no major deformities or cyanosis. NEUROLOGIC: No focal weakness or paresthesias are detected. SKIN: There are no ulcers or rashes noted. PSYCHIATRIC: The patient has a normal affect.  DATA:   MRI reviewed:    Assessment/Plan:  76 year old male with chronic debilitating lower back pain that presents for preop evaluation of anterior spine exposure for L5-S1 ALIF.  I reviewed his MRI and he does have a high takeoff of the hypogastric but I think we should be able to get an anterior spine exposure at L5-S1.  He is Jehovah's Witness so I discussed he would be taking some increased risk with this procedure particularly if we run into any significant bleeding that would require transfusion.  He is adamant against any blood transfusion.  States his quality of life is quite miserable.  I discussed transverse incision over the left rectus muscle and then entering the retroperitoneum to mobilize peritoneum and left ureter to the midline and then mobilizing possibly iliac artery and vein to get the L5-S1 disc space exposed from the front.  Questions answered and look forward to helping Dr. Saintclair Halsted on Monday.   Marty Heck, MD [facility] Office: [phone]

## 2022-07-25 ENCOUNTER — Other Ambulatory Visit: Payer: Self-pay

## 2022-07-26 ENCOUNTER — Ambulatory Visit: Payer: Medicare HMO | Admitting: Cardiology

## 2022-07-26 ENCOUNTER — Encounter: Payer: Self-pay | Admitting: Cardiology

## 2022-07-26 VITALS — BP 168/80 | HR 55 | Temp 98.2°F | Resp 16 | Ht 65.0 in | Wt 210.0 lb

## 2022-07-26 DIAGNOSIS — I2510 Atherosclerotic heart disease of native coronary artery without angina pectoris: Secondary | ICD-10-CM

## 2022-07-26 DIAGNOSIS — I4892 Unspecified atrial flutter: Secondary | ICD-10-CM

## 2022-07-26 DIAGNOSIS — I10 Essential (primary) hypertension: Secondary | ICD-10-CM

## 2022-07-26 DIAGNOSIS — Z01818 Encounter for other preprocedural examination: Secondary | ICD-10-CM

## 2022-07-26 NOTE — Progress Notes (Signed)
Anesthesia Chart Review:  [facility] for history of CAD managed medically, paroxysmal atrial flutter on Pradaxa, postop PE, pulmonary hypertension.  He had mild prox CAD CCTA 2019 and mild anteroseptal ischemia on stress test 04/2020; symptoms were controlled on metoprolol, and given mild anemia and refusal of blood products, medical therapy was recommended ("would like to avoid dual antiplatelet therapy and intervention, if possible"). Two week event monitor also ordered for irregular HR and showed predominant SR, 5 beat NSVT, < 30 second runs of atrial tachycardia but no afib. He was seen by Dr. Virgina Jock 07/12/2022 for preop evaluation and noted to be asymptomatic from CAD standpoint.  Updated echocardiogram was recommended due to history of pulmonary hypertension.  Echo 07/17/2022 showed EF 60 to 65%, grade 1 DD, mild mitral regurgitation, mild to moderate tricuspid regurgitation, no evidence of pulmonary hypertension. Dr. Virgina Jock subsequently commented on surgical risk in note 07/26/22 stating, "Moderate, owing to baseline CAD, h/o post op PE. Pulmonary hypertension noted in 01/2022 has resolved, which is encouraging. Hold Pradaxa 3 days before the surgery."  History of rheumatoid arthritis and lymphoma, maintained on rituximab.  History of C4-6 ACDF.  Patient is Jehovah's Witness and refuses blood and blood products.  Preop labs reviewed, creatinine mildly elevated 1.56, mild anemia with hemoglobin 11.7, otherwise unremarkable.  EKG 07/12/2022.  Sinus bradycardia.  Rate 53.  Right bundle branch block.  Echocardiogram 07/17/2022:  Normal LV systolic function with visual EF 60-65%. Left ventricle cavity  is normal in size. Normal left ventricular wall thickness. Normal global  wall motion. Doppler evidence of grade I (impaired) diastolic dysfunction,  indeterminate LAP. Calculated EF 67%.  Left atrial cavity is mildly dilated.  Structurally normal trileaflet aortic valve.  Trace aortic  regurgitation.  Structurally normal mitral valve.  Mild (Grade I) mitral regurgitation.  Structurally normal tricuspid valve.  Mild to moderate tricuspid  regurgitation.  Compared to echo in March 2023, No evidence of pulmonary hypertension.  Lexiscan Tetrofosmin stress test 04/13/2020: Lexiscan/modified Bruce nuclear stress test performed using 1-day protocol. Stress EKG is non-diagnostic, as this is pharmacological stress test. In addition, stress EKG at 67% MPHR showed sinus tachycardia, RBBB, LAFB.  SPECT images show small sized, mild intensity, reversible perfusion defect in apical anteroseptal myocardium. Stress LVEF 42%. Intermediate risk study.  CT Coronary 07/30/2018: - Aorta:  Normal size.  No calcifications.  No dissection. - Aortic Valve:  Trileaflet.  No calcifications. - Coronary Arteries:  Normal coronary origin.  Right dominance. - RCA is a large dominant artery that gives rise to PDA and PLVB. There is minimal on-calcified plaque with stenosis 0-25%. - Left main is a large artery that gives rise to LAD and LCX arteries. Left main has minimal stenosis. - LAD is a large vessel that has mild mixed plaque in the proximal and mid portio with stenosis 25-50% and a focal 50-69% stenosis in the mid LAD. - LCX is a non-dominant artery that gives rise to one large OM1 branch. There is minimal plaque with associated stenosis 0-25%. - Other findings: Normal pulmonary vein drainage into the left atrium. Normal let atrial appendage without a thrombus. Normal size of the pulmonary artery. IMPRESSION: 1. Coronary calcium score of 21. This was 72 percentile for age and sex matched control. 2. Normal coronary origin with right dominance. 3. There is mild to moderate CAD in the proximal and mid LAD. Aggressive risk factor modification is recommended.    Wynonia Musty [facility] Short Stay Center/Anesthesiology Phone [phone] 07/26/2022 4:15  PM

## 2022-07-26 NOTE — Progress Notes (Signed)
Patient referred by [name], FNP for precordial pain  Subjective:   Bryan [name], male    DOB: Feb 17, 1946, 76 y.o.   MRN: [id number]   Chief Complaint  Patient presents with   Hypertension   Results   Follow-up     HPI  76 y.o. 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  Patient is now having debilitating back pain and is going to undergo L5-S1 ALIF by Dr. Saintclair Halsted. From cardiac standpoint, he is doing well and denies any chest pain, dyspnea. Reviewed recent test results with the patient, details below.    OV 03/2020: Patient was evaluated by Dr. Sallyanne Kuster in 2019.  CT coronary angiogram at that time showed mild to moderate nonobstructive disease in LAD.  On 02/22/2020, patient went to [facility] emergency department with complaints of chest pain.  Episode of chest pain was retrosternal, burning, woke him up middle of night, and lasted for several hours. Patient went to [facility] ED. Workup showed CTA negative for PE. HS trop was mildly elevated but flat (25, 23). He has not had any recurrent chest pain since then. Patient is aware of extra beats that have been going on for a long time, and is unchanged. He does not do any regular physical activity at baseline.  Patient is on rituximab for rheumatoid arthritis and lymphoma, which has reportedly been stable.    Current Outpatient Medications:    acetaminophen (TYLENOL) 500 MG tablet, Take 1,000 mg by mouth daily as needed for moderate pain. (Patient not taking: Reported on 07/24/2022), Disp: , Rfl:    dabigatran (PRADAXA) 150 MG CAPS capsule, Take 1 capsule (150 mg total) by mouth 2 (two) times daily., Disp: 60 capsule, Rfl: 3   diltiazem (CARDIZEM CD) 120 MG 24 hr capsule, Take 1 capsule (120 mg total) by mouth daily., Disp: 90 capsule, Rfl:  2   furosemide (LASIX) 20 MG tablet, Take 1 tablet (20 mg total) by mouth daily., Disp: 90 tablet, Rfl: 2   hydrALAZINE (APRESOLINE) 50 MG tablet, Take 1 tablet (50 mg total) by mouth in the morning and at bedtime. (Patient not taking: Reported on 07/24/2022), Disp: 60 tablet, Rfl: 3   LACTOBACILLUS PO, Take 1 capsule by mouth 2 (two) times daily. (Patient not taking: Reported on 07/24/2022), Disp: , Rfl:    metoprolol (TOPROL-XL) 200 MG 24 hr tablet, Take 200 mg by mouth daily., Disp: , Rfl:    metoprolol succinate (TOPROL-XL) 100 MG 24 hr tablet, TAKE 1 TABLET BY MOUTH ONCE DAILY WITH  OR  IMMEDIATELY  FOLLOWING  A  MEAL (Patient not taking: Reported on 07/18/2022), Disp: 90 tablet, Rfl: 2   Multiple Vitamin (MULTIVITAMIN WITH MINERALS) TABS tablet, Take 1 tablet by mouth daily. (Patient not taking: Reported on 07/24/2022), Disp: , Rfl:    nitroGLYCERIN (NITROSTAT) 0.4 MG SL tablet, Place 0.4 mg under the tongue every 5 (five) minutes as needed for chest pain. (Patient not taking: Reported on 07/24/2022), Disp: , Rfl:    potassium chloride (KLOR-CON) 10 MEQ tablet, Take 2 tablets (20 mEq total) by mouth daily for 7 days. (Patient not taking: Reported on 07/18/2022), Disp: 14 tablet, Rfl: 0   primidone (MYSOLINE) 50 MG tablet, Take 150 mg by mouth 2 (two) times daily., Disp: , Rfl:    Tamsulosin HCl (FLOMAX) 0.4 MG CAPS, Take 0.4 mg  by mouth at bedtime., Disp: , Rfl:   Cardiovascular and other pertinent studies:  Echocardiogram 07/17/2022:  Normal LV systolic function with visual EF 60-65%. Left ventricle cavity  is normal in size. Normal left ventricular wall thickness. Normal global  wall motion. Doppler evidence of grade I (impaired) diastolic dysfunction,  indeterminate LAP. Calculated EF 67%.  Left atrial cavity is mildly dilated.  Structurally normal trileaflet aortic valve.  Trace aortic regurgitation.  Structurally normal mitral valve.  Mild (Grade I) mitral regurgitation.  Structurally normal  tricuspid valve.  Mild to moderate tricuspid  regurgitation.  Compared to echo in March 2023, No evidence of pulmonary hypertension.   EKG 07/12/2022: Sinus rhythm 53 bpm Right bundle branch block  Event monitor 04/28/2020 - 05/11/2020: Diagnostic time: 74%  Dominant rhythm: Sinus. HR 52-109 bpm. Avg HR 68 bpm. 5 beat NSVT at 2:44 am CT on 04/28/2020.  Recurring episodes of atrial tachycardia <30 sec on 05/06/2020 10:14 AM CT No atrial fibrillation/atrial flutter/SVT/VT/high grade AV block, sinus pause >3sec noted. Symptoms reported: None  Lexiscan Tetrofosmin stress test 04/13/2020: Lexiscan/modified Bruce nuclear stress test performed using 1-day protocol. Stress EKG is non-diagnostic, as this is pharmacological stress test. In addition, stress EKG at 67% MPHR showed sinus tachycardia, RBBB, LAFB.  SPECT images show small sized, mild intensity, reversible perfusion defect in apical anteroseptal myocardium. Stress LVEF 42%. Intermediate risk study.  CTA chest 02/2020: Negative for PE  Coronary CT angiogram 07/2018: 1. Coronary calcium score of 21. This was 58 percentile for age and sex matched control. 2. Normal coronary origin with right dominance. 3. There is mild to moderate CAD in the proximal and mid LAD. Aggressive risk factor modification is recommended.   Recent labs: 05/05/2022: Glucose 88, BUN/Cr 16/1.25. EGFR 59. Na/K 142/3.2.  H/H 10.8/34.3. MCV 91. Platelets 311  01/2022: HbA1C 5.8%  03/04/2020: Glucose 80, BUN/Cr 18/1.25. EGFR >60. Na/K 147/3.7.  H/H 10.8/34.6. MCV 93.8. Platelets 257 HbA1C N/A  2018: Chol 168, TG 65, HDL 69, LDL 86 TSH normal    Review of Systems  Cardiovascular:  Positive for chest pain. Negative for dyspnea on exertion, leg swelling, palpitations and syncope.        Vitals:   07/26/22 1103  BP: (!) 168/80  Pulse: (!) 55  Resp: 16  Temp: 98.2 F (36.8 C)  SpO2: 94%     Body mass index is 34.95 kg/m. Filed Weights   07/26/22  1103  Weight: 210 lb (95.3 kg)     Objective:   Physical Exam Vitals and nursing note reviewed.  Constitutional:      General: He is not in acute distress. Neck:     Vascular: No JVD.  Cardiovascular:     Rate and Rhythm: Normal rate and regular rhythm.     Pulses: Intact distal pulses.     Heart sounds: Normal heart sounds. No murmur heard. Pulmonary:     Effort: Pulmonary effort is normal.     Breath sounds: Normal breath sounds. No wheezing or rales.        Assessment & Recommendations:   76 y.o. 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  CAD: Mild prox CAD CCTA 2019. Mild anteroseptal ischemia stress test 04/2020. In light of his controlled symptoms on metoprolol, continue the same. Not on Aspirin due to ongoing use of Pradaxa for atrial flutter, PE.   Paroxysmal atrial  flutter: CHA2DS2-VASc Score 4, annual stroke risk 4.8%, currently on Pradaxa. Hold 3 days before the surgery.  Perioperative cardiac risk: Moderate, owing to baseline CAD, h/o post op PE. Pulmonary hypertension noted in 01/2022 has resolved, which is encouraging.  Hold Pradaxa 3 days before the surgery.  F/u in 3 months   Nigel Mormon, MD Pager: [phone] Office: [phone]

## 2022-07-26 NOTE — Anesthesia Preprocedure Evaluation (Signed)
Anesthesia Evaluation  Patient identified by MRN, date of birth, ID band Patient awake    Reviewed: Allergy & Precautions, NPO status , Patient's Chart, lab work & pertinent test results  Airway Mallampati: II  TM Distance: >3 FB Neck ROM: Full    Dental no notable dental hx.    Pulmonary sleep apnea , former smoker,    Pulmonary exam normal breath sounds clear to auscultation       Cardiovascular hypertension, Pt. on medications + dysrhythmias Atrial Fibrillation + Valvular Problems/Murmurs MR  Rhythm:Regular Rate:Normal + Systolic murmurs    Neuro/Psych negative neurological ROS  negative psych ROS   GI/Hepatic negative GI ROS, Neg liver ROS,   Endo/Other  diabetes  Renal/GU negative Renal ROS  negative genitourinary   Musculoskeletal  (+) Arthritis , Rheumatoid disorders,    Abdominal   Peds negative pediatric ROS (+)  Hematology negative hematology ROS (+)   Anesthesia Other Findings   Reproductive/Obstetrics negative OB ROS                            Anesthesia Physical Anesthesia Plan  ASA: 3  Anesthesia Plan: General   Post-op Pain Management: Ofirmev IV (intra-op)*   Induction: Intravenous  PONV Risk Score and Plan: 2 and Ondansetron, Dexamethasone and Treatment may vary due to age or medical condition  Airway Management Planned: Oral ETT  Additional Equipment: Arterial line  Intra-op Plan:   Post-operative Plan: Extubation in OR  Informed Consent: I have reviewed the patients History and Physical, chart, labs and discussed the procedure including the risks, benefits and alternatives for the proposed anesthesia with the patient or authorized representative who has indicated his/her understanding and acceptance.     Dental advisory given  Plan Discussed with: CRNA and Surgeon  Anesthesia Plan Comments: (PAT note by Karoline Caldwell, PA-C: [facility] for  history of CAD managed medically, paroxysmal atrial flutter on Pradaxa, postop PE, pulmonary hypertension.  He had mild prox CAD CCTA 2019 and mild anteroseptal ischemia on stress test 04/2020; symptoms were controlled on metoprolol, and given mild anemia and refusal of blood products, medicaltherapy was recommended ("would like to avoid dual antiplatelet therapy and intervention, if possible"). Two week event monitor also ordered for irregular HR and showed predominant SR, 5 beat NSVT, <30 second runs of atrial tachycardia but no afib. He was seen by Dr. Virgina Jock 07/12/2022 for preop evaluation and noted to be asymptomatic from CAD standpoint.  Updated echocardiogram was recommended due to history of pulmonary hypertension.  Echo 07/17/2022 showed EF 60 to 65%, grade 1 DD, mild mitral regurgitation, mild to moderate tricuspid regurgitation, no evidence of pulmonary hypertension. Dr. Virgina Jock subsequently commented on surgical risk in note 07/26/22 stating, "Moderate, owing to baseline CAD, h/o post op PE. Pulmonary hypertension noted in 01/2022 has resolved, which is encouraging. Hold Pradaxa 3 days before the surgery."  History of rheumatoid arthritis and lymphoma, maintained on rituximab.  History of C4-6 ACDF.  Patient is Jehovah's Witness and refuses blood and blood products.  Preop labs reviewed, creatinine mildly elevated 1.56, mild anemia with hemoglobin 11.7, otherwise unremarkable.  EKG 07/12/2022.  Sinus bradycardia.  Rate 53.  Right bundle branch block.  Echocardiogram 07/17/2022:  Normal LV systolic function with visual EF 60-65%. Left ventricle cavity  is normal in size. Normal left ventricular wall thickness. Normal global  wall motion. Doppler evidence of grade I (impaired) diastolic dysfunction,  indeterminate LAP. Calculated EF 67%.  Left  atrial cavity is mildly dilated.  Structurally normal trileaflet aortic valve. Trace aortic regurgitation.  Structurally normal mitral valve.  Mild (Grade I) mitral regurgitation.  Structurally normal tricuspid valve. Mild to moderate tricuspid  regurgitation.  Compared to echo in March 2023, No evidence of pulmonary hypertension.  Lexiscan Tetrofosmin stress test 04/13/2020: Lexiscan/modified Bruce nuclear stress test performed using 1-day protocol. Stress EKG is non-diagnostic, as this is pharmacological stress test. In addition, stress EKG at 67% MPHR showed sinus tachycardia, RBBB, LAFB.  SPECT images show small sized, mild intensity, reversible perfusion defect in apical anteroseptal myocardium. Stress LVEF 42%. Intermediate risk study.  CT Coronary 07/30/2018: -Aorta: Normal size. No calcifications. No dissection. -Aortic Valve: Trileaflet. No calcifications. -Coronary Arteries: Normal coronary origin. Right dominance. -RCA is a large dominant artery that gives rise to PDA and PLVB. There is minimal on-calcified plaque with stenosis 0-25%. -Left main is a large artery that gives rise to LAD and LCX arteries. Left main has minimal stenosis. -LAD is a large vessel that has mild mixed plaque in the proximal and mid portio with stenosis 25-50% and a focal 50-69% stenosis in the mid LAD. -LCX is a non-dominant artery that gives rise to one large OM1 branch. There is minimal plaque with associated stenosis 0-25%. -Other findings: Normal pulmonary vein drainage into the left atrium. Normal let atrial appendage without a thrombus. Normal size of the pulmonary artery. IMPRESSION: 1. Coronary calcium score of 21. This was 48 percentile for age and sex matched control. 2. Normal coronary origin with right dominance. 3. There is mild to moderate CAD in the proximal and mid LAD. Aggressive risk factor modification is recommended.  )       Anesthesia Quick Evaluation

## 2022-07-30 ENCOUNTER — Inpatient Hospital Stay (HOSPITAL_COMMUNITY): Payer: No Typology Code available for payment source

## 2022-07-30 ENCOUNTER — Inpatient Hospital Stay (HOSPITAL_COMMUNITY)
Admission: RE | Disposition: A | Discharge status: Rehabilitation Facility | Payer: Self-pay | Source: Home / Self Care | Attending: Neurosurgery

## 2022-07-30 ENCOUNTER — Other Ambulatory Visit: Payer: Self-pay

## 2022-07-30 ENCOUNTER — Inpatient Hospital Stay (HOSPITAL_COMMUNITY)
Admission: RE | Admit: 2022-07-30 | Discharge: 2022-08-03 | DRG: 454 | Disposition: A | Discharge status: Rehabilitation Facility | Payer: No Typology Code available for payment source | Attending: Neurosurgery | Admitting: Neurosurgery

## 2022-07-30 ENCOUNTER — Encounter (HOSPITAL_COMMUNITY): Payer: Self-pay | Admitting: Neurosurgery

## 2022-07-30 ENCOUNTER — Inpatient Hospital Stay (HOSPITAL_COMMUNITY): Payer: No Typology Code available for payment source | Admitting: Anesthesiology

## 2022-07-30 ENCOUNTER — Inpatient Hospital Stay (HOSPITAL_COMMUNITY): Payer: No Typology Code available for payment source | Admitting: Physician Assistant

## 2022-07-30 DIAGNOSIS — M5137 Other intervertebral disc degeneration, lumbosacral region: Secondary | ICD-10-CM | POA: Diagnosis present

## 2022-07-30 DIAGNOSIS — E876 Hypokalemia: Secondary | ICD-10-CM | POA: Diagnosis present

## 2022-07-30 DIAGNOSIS — Z7901 Long term (current) use of anticoagulants: Secondary | ICD-10-CM | POA: Diagnosis not present

## 2022-07-30 DIAGNOSIS — C8300 Small cell B-cell lymphoma, unspecified site: Secondary | ICD-10-CM | POA: Diagnosis present

## 2022-07-30 DIAGNOSIS — E119 Type 2 diabetes mellitus without complications: Secondary | ICD-10-CM | POA: Diagnosis present

## 2022-07-30 DIAGNOSIS — I480 Paroxysmal atrial fibrillation: Secondary | ICD-10-CM | POA: Diagnosis present

## 2022-07-30 DIAGNOSIS — K219 Gastro-esophageal reflux disease without esophagitis: Secondary | ICD-10-CM | POA: Diagnosis present

## 2022-07-30 DIAGNOSIS — Z981 Arthrodesis status: Secondary | ICD-10-CM

## 2022-07-30 DIAGNOSIS — N400 Enlarged prostate without lower urinary tract symptoms: Secondary | ICD-10-CM | POA: Diagnosis present

## 2022-07-30 DIAGNOSIS — Z79899 Other long term (current) drug therapy: Secondary | ICD-10-CM | POA: Diagnosis not present

## 2022-07-30 DIAGNOSIS — Z833 Family history of diabetes mellitus: Secondary | ICD-10-CM

## 2022-07-30 DIAGNOSIS — G4733 Obstructive sleep apnea (adult) (pediatric): Secondary | ICD-10-CM | POA: Diagnosis present

## 2022-07-30 DIAGNOSIS — Z6834 Body mass index (BMI) 34.0-34.9, adult: Secondary | ICD-10-CM | POA: Diagnosis not present

## 2022-07-30 DIAGNOSIS — Z87891 Personal history of nicotine dependence: Secondary | ICD-10-CM | POA: Diagnosis not present

## 2022-07-30 DIAGNOSIS — D62 Acute posthemorrhagic anemia: Secondary | ICD-10-CM | POA: Diagnosis not present

## 2022-07-30 DIAGNOSIS — Z888 Allergy status to other drugs, medicaments and biological substances status: Secondary | ICD-10-CM | POA: Diagnosis not present

## 2022-07-30 DIAGNOSIS — I10 Essential (primary) hypertension: Secondary | ICD-10-CM

## 2022-07-30 DIAGNOSIS — I9752 Accidental puncture and laceration of a circulatory system organ or structure during other procedure: Secondary | ICD-10-CM | POA: Diagnosis not present

## 2022-07-30 DIAGNOSIS — I4891 Unspecified atrial fibrillation: Secondary | ICD-10-CM | POA: Diagnosis not present

## 2022-07-30 DIAGNOSIS — Y831 Surgical operation with implant of artificial internal device as the cause of abnormal reaction of the patient, or of later complication, without mention of misadventure at the time of the procedure: Secondary | ICD-10-CM | POA: Diagnosis present

## 2022-07-30 DIAGNOSIS — M5106 Intervertebral disc disorders with myelopathy, lumbar region: Secondary | ICD-10-CM | POA: Diagnosis not present

## 2022-07-30 DIAGNOSIS — N179 Acute kidney failure, unspecified: Secondary | ICD-10-CM | POA: Diagnosis present

## 2022-07-30 DIAGNOSIS — M960 Pseudarthrosis after fusion or arthrodesis: Principal | ICD-10-CM | POA: Diagnosis present

## 2022-07-30 DIAGNOSIS — E669 Obesity, unspecified: Secondary | ICD-10-CM | POA: Diagnosis present

## 2022-07-30 DIAGNOSIS — G4730 Sleep apnea, unspecified: Secondary | ICD-10-CM | POA: Diagnosis not present

## 2022-07-30 DIAGNOSIS — Z880 Allergy status to penicillin: Secondary | ICD-10-CM | POA: Diagnosis not present

## 2022-07-30 DIAGNOSIS — D649 Anemia, unspecified: Secondary | ICD-10-CM | POA: Diagnosis present

## 2022-07-30 DIAGNOSIS — Z8249 Family history of ischemic heart disease and other diseases of the circulatory system: Secondary | ICD-10-CM

## 2022-07-30 DIAGNOSIS — R2689 Other abnormalities of gait and mobility: Secondary | ICD-10-CM | POA: Diagnosis not present

## 2022-07-30 DIAGNOSIS — M069 Rheumatoid arthritis, unspecified: Secondary | ICD-10-CM | POA: Diagnosis present

## 2022-07-30 DIAGNOSIS — M4807 Spinal stenosis, lumbosacral region: Principal | ICD-10-CM | POA: Diagnosis present

## 2022-07-30 DIAGNOSIS — Z01818 Encounter for other preprocedural examination: Secondary | ICD-10-CM

## 2022-07-30 HISTORY — PX: LAMINECTOMY WITH POSTERIOR LATERAL ARTHRODESIS LEVEL 1: SHX6335

## 2022-07-30 HISTORY — PX: ANTERIOR LUMBAR FUSION: SHX1170

## 2022-07-30 HISTORY — DX: Paroxysmal atrial fibrillation: I48.0

## 2022-07-30 HISTORY — PX: ABDOMINAL EXPOSURE: SHX5708

## 2022-07-30 LAB — GLUCOSE, CAPILLARY: Glucose-Capillary: 111 mg/dL — ABNORMAL HIGH (ref 70–99)

## 2022-07-30 SURGERY — ANTERIOR LUMBAR FUSION 1 LEVEL
Anesthesia: General | Site: Back

## 2022-07-30 MED ORDER — 0.9 % SODIUM CHLORIDE (POUR BTL) OPTIME
TOPICAL | Status: DC | PRN
  Administered 2022-07-30: 1000 mL

## 2022-07-30 MED ORDER — HYDRALAZINE HCL 20 MG/ML IJ SOLN
10.0000 mg | Freq: Once | INTRAMUSCULAR | Status: AC
  Administered 2022-07-30: 10 mg via INTRAVENOUS

## 2022-07-30 MED ORDER — SODIUM CHLORIDE 0.9 % IV SOLN
250.0000 mL | INTRAVENOUS | Status: DC
  Administered 2022-07-30 – 2022-07-31 (×2): 250 mL via INTRAVENOUS

## 2022-07-30 MED ORDER — PHENYLEPHRINE HCL-NACL 20-0.9 MG/250ML-% IV SOLN
INTRAVENOUS | Status: DC | PRN
  Administered 2022-07-30: 40 ug/min via INTRAVENOUS

## 2022-07-30 MED ORDER — ORAL CARE MOUTH RINSE: 15.0000 mL | Freq: Once | OROMUCOSAL | Status: AC

## 2022-07-30 MED ORDER — FENTANYL CITRATE (PF) 250 MCG/5ML IJ SOLN
INTRAMUSCULAR | Status: AC
  Filled 2022-07-30: qty 5

## 2022-07-30 MED ORDER — HYDROMORPHONE HCL 1 MG/ML IJ SOLN
INTRAMUSCULAR | Status: AC
  Filled 2022-07-30: qty 1

## 2022-07-30 MED ORDER — CHLORHEXIDINE GLUCONATE CLOTH 2 % EX PADS: 6.0000 | MEDICATED_PAD | Freq: Once | CUTANEOUS | Status: DC

## 2022-07-30 MED ORDER — LACTATED RINGERS IV SOLN: INTRAVENOUS | Status: DC

## 2022-07-30 MED ORDER — TAMSULOSIN HCL 0.4 MG PO CAPS
0.4000 mg | ORAL_CAPSULE | Freq: Every day | ORAL | Status: DC
  Administered 2022-07-30 – 2022-08-02 (×4): 0.4 mg via ORAL
  Filled 2022-07-30 (×4): qty 1

## 2022-07-30 MED ORDER — THROMBIN 20000 UNITS EX SOLR
CUTANEOUS | Status: AC
  Filled 2022-07-30: qty 20000

## 2022-07-30 MED ORDER — ROCURONIUM BROMIDE 10 MG/ML (PF) SYRINGE
PREFILLED_SYRINGE | INTRAVENOUS | Status: AC
  Filled 2022-07-30: qty 10

## 2022-07-30 MED ORDER — HYDROMORPHONE HCL 1 MG/ML IJ SOLN
INTRAMUSCULAR | Status: AC
  Filled 2022-07-30: qty 0.5

## 2022-07-30 MED ORDER — DILTIAZEM HCL ER COATED BEADS 120 MG PO CP24
120.0000 mg | ORAL_CAPSULE | Freq: Every day | ORAL | Status: DC
  Administered 2022-07-31 – 2022-08-03 (×4): 120 mg via ORAL
  Filled 2022-07-30 (×4): qty 1

## 2022-07-30 MED ORDER — BUPIVACAINE LIPOSOME 1.3 % IJ SUSP
INTRAMUSCULAR | Status: DC | PRN
  Administered 2022-07-30: 20 mL

## 2022-07-30 MED ORDER — ONDANSETRON HCL 4 MG/2ML IJ SOLN
INTRAMUSCULAR | Status: DC | PRN
  Administered 2022-07-30 (×2): 4 mg via INTRAVENOUS

## 2022-07-30 MED ORDER — ONDANSETRON HCL 4 MG/2ML IJ SOLN
INTRAMUSCULAR | Status: AC
  Filled 2022-07-30: qty 2

## 2022-07-30 MED ORDER — VANCOMYCIN HCL IN DEXTROSE 1-5 GM/200ML-% IV SOLN: 1000.0000 mg | INTRAVENOUS | Status: AC

## 2022-07-30 MED ORDER — KETAMINE HCL 10 MG/ML IJ SOLN
INTRAMUSCULAR | Status: DC | PRN
  Administered 2022-07-30 (×4): 10 mg via INTRAVENOUS

## 2022-07-30 MED ORDER — HYDRALAZINE HCL 20 MG/ML IJ SOLN
INTRAMUSCULAR | Status: AC
  Filled 2022-07-30: qty 1

## 2022-07-30 MED ORDER — EPHEDRINE SULFATE-NACL 50-0.9 MG/10ML-% IV SOSY
PREFILLED_SYRINGE | INTRAVENOUS | Status: DC | PRN
  Administered 2022-07-30: 5 mg via INTRAVENOUS
  Administered 2022-07-30 (×2): 10 mg via INTRAVENOUS

## 2022-07-30 MED ORDER — CYCLOBENZAPRINE HCL 10 MG PO TABS
10.0000 mg | ORAL_TABLET | Freq: Three times a day (TID) | ORAL | Status: DC | PRN
  Administered 2022-07-31: 10 mg via ORAL
  Filled 2022-07-30: qty 1

## 2022-07-30 MED ORDER — ACETAMINOPHEN 650 MG RE SUPP: 650.0000 mg | RECTAL | Status: DC | PRN

## 2022-07-30 MED ORDER — PROPOFOL 10 MG/ML IV BOLUS
INTRAVENOUS | Status: AC
  Filled 2022-07-30: qty 20

## 2022-07-30 MED ORDER — ACETAMINOPHEN 10 MG/ML IV SOLN
INTRAVENOUS | Status: AC
  Filled 2022-07-30: qty 100

## 2022-07-30 MED ORDER — LIDOCAINE-EPINEPHRINE 1 %-1:100000 IJ SOLN
INTRAMUSCULAR | Status: AC
  Filled 2022-07-30: qty 1

## 2022-07-30 MED ORDER — DEXAMETHASONE SODIUM PHOSPHATE 10 MG/ML IJ SOLN
INTRAMUSCULAR | Status: AC
  Filled 2022-07-30: qty 1

## 2022-07-30 MED ORDER — ACETAMINOPHEN 500 MG PO TABS: 1000.0000 mg | ORAL_TABLET | Freq: Every day | ORAL | Status: DC | PRN

## 2022-07-30 MED ORDER — CEFAZOLIN SODIUM-DEXTROSE 2-3 GM-%(50ML) IV SOLR
INTRAVENOUS | Status: DC | PRN
  Administered 2022-07-30 (×2): 2 g via INTRAVENOUS

## 2022-07-30 MED ORDER — BUPIVACAINE HCL (PF) 0.25 % IJ SOLN
INTRAMUSCULAR | Status: AC
  Filled 2022-07-30: qty 30

## 2022-07-30 MED ORDER — PHENYLEPHRINE HCL-NACL 20-0.9 MG/250ML-% IV SOLN
INTRAVENOUS | Status: AC
  Filled 2022-07-30: qty 250

## 2022-07-30 MED ORDER — BUPIVACAINE LIPOSOME 1.3 % IJ SUSP
INTRAMUSCULAR | Status: AC
  Filled 2022-07-30: qty 20

## 2022-07-30 MED ORDER — SODIUM CHLORIDE 0.9% FLUSH
3.0000 mL | Freq: Two times a day (BID) | INTRAVENOUS | Status: DC
  Administered 2022-07-31 – 2022-08-03 (×8): 3 mL via INTRAVENOUS

## 2022-07-30 MED ORDER — LIDOCAINE 2% (20 MG/ML) 5 ML SYRINGE
INTRAMUSCULAR | Status: DC | PRN
  Administered 2022-07-30: 100 mg via INTRAVENOUS

## 2022-07-30 MED ORDER — HYDROCODONE-ACETAMINOPHEN 5-325 MG PO TABS
2.0000 | ORAL_TABLET | ORAL | Status: DC | PRN
  Administered 2022-07-31 – 2022-08-03 (×4): 2 via ORAL
  Filled 2022-07-30 (×4): qty 2

## 2022-07-30 MED ORDER — FENTANYL CITRATE (PF) 100 MCG/2ML IJ SOLN
INTRAMUSCULAR | Status: DC | PRN
  Administered 2022-07-30: 100 ug via INTRAVENOUS
  Administered 2022-07-30 (×6): 50 ug via INTRAVENOUS

## 2022-07-30 MED ORDER — OXYCODONE HCL 5 MG PO TABS: 5.0000 mg | ORAL_TABLET | Freq: Once | ORAL | Status: DC | PRN

## 2022-07-30 MED ORDER — RISAQUAD PO CAPS
1.0000 | ORAL_CAPSULE | Freq: Two times a day (BID) | ORAL | Status: DC
  Administered 2022-07-30 – 2022-08-03 (×8): 1 via ORAL
  Filled 2022-07-30 (×8): qty 1

## 2022-07-30 MED ORDER — METOPROLOL SUCCINATE ER 50 MG PO TB24
100.0000 mg | ORAL_TABLET | Freq: Every day | ORAL | Status: DC
  Administered 2022-07-31 – 2022-08-03 (×4): 100 mg via ORAL
  Filled 2022-07-30 (×4): qty 2

## 2022-07-30 MED ORDER — PRIMIDONE 50 MG PO TABS
150.0000 mg | ORAL_TABLET | Freq: Two times a day (BID) | ORAL | Status: DC
  Administered 2022-07-30 – 2022-08-03 (×8): 150 mg via ORAL
  Filled 2022-07-30 (×9): qty 3

## 2022-07-30 MED ORDER — SODIUM CHLORIDE 0.9% FLUSH: 3.0000 mL | INTRAVENOUS | Status: DC | PRN

## 2022-07-30 MED ORDER — DEXMEDETOMIDINE HCL IN NACL 80 MCG/20ML IV SOLN
INTRAVENOUS | Status: AC
  Filled 2022-07-30: qty 20

## 2022-07-30 MED ORDER — ONDANSETRON HCL 4 MG PO TABS: 4.0000 mg | ORAL_TABLET | Freq: Four times a day (QID) | ORAL | Status: DC | PRN

## 2022-07-30 MED ORDER — ALUM & MAG HYDROXIDE-SIMETH 200-200-20 MG/5ML PO SUSP: 30.0000 mL | Freq: Four times a day (QID) | ORAL | Status: DC | PRN

## 2022-07-30 MED ORDER — ONDANSETRON HCL 4 MG/2ML IJ SOLN
4.0000 mg | Freq: Once | INTRAMUSCULAR | Status: AC | PRN
  Administered 2022-07-30: 4 mg via INTRAVENOUS

## 2022-07-30 MED ORDER — ACETAMINOPHEN 325 MG PO TABS
650.0000 mg | ORAL_TABLET | ORAL | Status: DC | PRN
  Administered 2022-07-31: 650 mg via ORAL
  Filled 2022-07-30 (×2): qty 2

## 2022-07-30 MED ORDER — CHLORHEXIDINE GLUCONATE 0.12 % MT SOLN: 15.0000 mL | Freq: Once | OROMUCOSAL | Status: AC

## 2022-07-30 MED ORDER — VANCOMYCIN HCL IN DEXTROSE 1-5 GM/200ML-% IV SOLN
INTRAVENOUS | Status: AC
  Administered 2022-07-30: 1000 mg via INTRAVENOUS
  Filled 2022-07-30: qty 200

## 2022-07-30 MED ORDER — ONDANSETRON HCL 4 MG/2ML IJ SOLN: 4.0000 mg | Freq: Four times a day (QID) | INTRAMUSCULAR | Status: DC | PRN

## 2022-07-30 MED ORDER — CEFAZOLIN SODIUM 1 G IJ SOLR
INTRAMUSCULAR | Status: AC
  Filled 2022-07-30: qty 20

## 2022-07-30 MED ORDER — DEXMEDETOMIDINE HCL IN NACL 80 MCG/20ML IV SOLN
INTRAVENOUS | Status: DC | PRN
  Administered 2022-07-30 (×2): 4 ug via BUCCAL

## 2022-07-30 MED ORDER — PHENOL 1.4 % MT LIQD: 1.0000 | OROMUCOSAL | Status: DC | PRN

## 2022-07-30 MED ORDER — ROCURONIUM BROMIDE 100 MG/10ML IV SOLN
INTRAVENOUS | Status: DC | PRN
  Administered 2022-07-30: 40 mg via INTRAVENOUS
  Administered 2022-07-30: 100 mg via INTRAVENOUS
  Administered 2022-07-30 (×2): 30 mg via INTRAVENOUS
  Administered 2022-07-30: 20 mg via INTRAVENOUS
  Administered 2022-07-30: 30 mg via INTRAVENOUS
  Administered 2022-07-30: 20 mg via INTRAVENOUS

## 2022-07-30 MED ORDER — PROPOFOL 10 MG/ML IV BOLUS
INTRAVENOUS | Status: DC | PRN
  Administered 2022-07-30: 50 mg via INTRAVENOUS
  Administered 2022-07-30: 40 mg via INTRAVENOUS
  Administered 2022-07-30: 50 mg via INTRAVENOUS
  Administered 2022-07-30: 110 mg via INTRAVENOUS

## 2022-07-30 MED ORDER — OXYCODONE HCL 5 MG/5ML PO SOLN: 5.0000 mg | Freq: Once | ORAL | Status: DC | PRN

## 2022-07-30 MED ORDER — DEXAMETHASONE SODIUM PHOSPHATE 10 MG/ML IJ SOLN
INTRAMUSCULAR | Status: DC | PRN
  Administered 2022-07-30: 8 mg via INTRAVENOUS

## 2022-07-30 MED ORDER — VANCOMYCIN HCL IN DEXTROSE 1-5 GM/200ML-% IV SOLN
1000.0000 mg | INTRAVENOUS | Status: DC
  Administered 2022-07-31 – 2022-08-03 (×4): 1000 mg via INTRAVENOUS
  Filled 2022-07-30 (×4): qty 200

## 2022-07-30 MED ORDER — MENTHOL 3 MG MT LOZG: 1.0000 | LOZENGE | OROMUCOSAL | Status: DC | PRN

## 2022-07-30 MED ORDER — ADULT MULTIVITAMIN W/MINERALS CH
1.0000 | ORAL_TABLET | Freq: Every day | ORAL | Status: DC
  Administered 2022-07-30 – 2022-08-03 (×5): 1 via ORAL
  Filled 2022-07-30 (×5): qty 1

## 2022-07-30 MED ORDER — FUROSEMIDE 20 MG PO TABS
20.0000 mg | ORAL_TABLET | Freq: Every day | ORAL | Status: DC
  Administered 2022-07-31 – 2022-08-03 (×4): 20 mg via ORAL
  Filled 2022-07-30 (×4): qty 1

## 2022-07-30 MED ORDER — THROMBIN 20000 UNITS EX SOLR
CUTANEOUS | Status: DC | PRN
  Administered 2022-07-30 (×2): 5 mL via TOPICAL

## 2022-07-30 MED ORDER — PANTOPRAZOLE SODIUM 40 MG IV SOLR
40.0000 mg | Freq: Every day | INTRAVENOUS | Status: DC
  Administered 2022-07-30: 40 mg via INTRAVENOUS
  Filled 2022-07-30: qty 10

## 2022-07-30 MED ORDER — ACETAMINOPHEN 10 MG/ML IV SOLN
INTRAVENOUS | Status: DC | PRN
  Administered 2022-07-30: 1000 mg via INTRAVENOUS

## 2022-07-30 MED ORDER — CHLORHEXIDINE GLUCONATE 0.12 % MT SOLN
OROMUCOSAL | Status: AC
  Administered 2022-07-30: 15 mL via OROMUCOSAL
  Filled 2022-07-30: qty 15

## 2022-07-30 MED ORDER — LIDOCAINE 2% (20 MG/ML) 5 ML SYRINGE
INTRAMUSCULAR | Status: AC
  Filled 2022-07-30: qty 5

## 2022-07-30 MED ORDER — HYDROMORPHONE HCL 1 MG/ML IJ SOLN: 0.5000 mg | INTRAMUSCULAR | Status: DC | PRN

## 2022-07-30 MED ORDER — SUGAMMADEX SODIUM 200 MG/2ML IV SOLN
INTRAVENOUS | Status: DC | PRN
  Administered 2022-07-30: 200 mg via INTRAVENOUS

## 2022-07-30 MED ORDER — KETAMINE HCL 50 MG/5ML IJ SOSY
PREFILLED_SYRINGE | INTRAMUSCULAR | Status: AC
  Filled 2022-07-30: qty 5

## 2022-07-30 MED ORDER — HYDROMORPHONE HCL 1 MG/ML IJ SOLN
0.2500 mg | INTRAMUSCULAR | Status: DC | PRN
  Administered 2022-07-30 (×2): 0.5 mg via INTRAVENOUS

## 2022-07-30 MED ORDER — NITROGLYCERIN 0.4 MG SL SUBL: 0.4000 mg | SUBLINGUAL_TABLET | SUBLINGUAL | Status: DC | PRN

## 2022-07-30 MED ORDER — FENTANYL CITRATE (PF) 100 MCG/2ML IJ SOLN
INTRAMUSCULAR | Status: AC
  Filled 2022-07-30: qty 2

## 2022-07-30 MED ORDER — HYDROMORPHONE HCL 1 MG/ML IJ SOLN
INTRAMUSCULAR | Status: DC | PRN
  Administered 2022-07-30 (×2): .5 mg via INTRAVENOUS

## 2022-07-30 SURGICAL SUPPLY — 127 items
ADH SKN CLS APL DERMABOND .7 (GAUZE/BANDAGES/DRESSINGS) ×8
ANCH SPNL 25 LMBR MIS (Anchor) ×6 IMPLANT
ANCHOR LUMBAR 25 MIS (Anchor) IMPLANT
APL SKNCLS STERI-STRIP NONHPOA (GAUZE/BANDAGES/DRESSINGS) ×2
APPLIER CLIP 11 MED OPEN (CLIP) ×2
APR CLP MED 11 20 MLT OPN (CLIP) ×2
BAG COUNTER SPONGE SURGICOUNT (BAG) ×6 IMPLANT
BAG SPNG CNTER NS LX DISP (BAG) ×8
BENZOIN TINCTURE PRP APPL 2/3 (GAUZE/BANDAGES/DRESSINGS) ×2 IMPLANT
BLADE CLIPPER SURG (BLADE) IMPLANT
BLADE SURG 11 STRL SS (BLADE) ×2 IMPLANT
BONE CANC CHIPS 20CC PCAN1/4 (Bone Implant) ×2 IMPLANT
BONE MATRIX OSTEOCEL PRO MED (Bone Implant) IMPLANT
BONE VIVIGEN FORMABLE 10CC (Bone Implant) ×2 IMPLANT
BUR 14 MATCH 3 (BUR) IMPLANT
BUR CUTTER 7.0 ROUND (BURR) IMPLANT
BUR MATCHSTICK NEURO 3.0 LAGG (BURR) ×2 IMPLANT
BUR MR8 14 BALL 5 (BUR) IMPLANT
BUR PRECISION FLUTE 6.0 (BURR) ×2 IMPLANT
BURR 14 MATCH 3 (BUR)
BURR MR8 14 BALL 5 (BUR)
CANISTER SUCT 3000ML PPV (MISCELLANEOUS) ×4 IMPLANT
CAP LOCKING (Cap) ×10 IMPLANT
CAP LOCKING 5.5 CREO (Cap) IMPLANT
CAP LOCKING THREADED (Cap) IMPLANT
CARTRIDGE OIL MAESTRO DRILL (MISCELLANEOUS) ×2 IMPLANT
CHIPS CANC BONE 20CC PCAN1/4 (Bone Implant) ×2 IMPLANT
CLIP APPLIE 11 MED OPEN (CLIP) ×4 IMPLANT
CLIP LIGATING EXTRA MED SLVR (CLIP) IMPLANT
CNTNR URN SCR LID CUP LEK RST (MISCELLANEOUS) ×2 IMPLANT
CONT SPEC 4OZ STRL OR WHT (MISCELLANEOUS) ×2
COVER BACK TABLE 24X17X13 BIG (DRAPES) IMPLANT
COVER BACK TABLE 60X90IN (DRAPES) ×4 IMPLANT
COVERAGE SUPPORT O-ARM STEALTH (MISCELLANEOUS) ×2 IMPLANT
DERMABOND ADVANCED .7 DNX12 (GAUZE/BANDAGES/DRESSINGS) ×4 IMPLANT
DIFFUSER DRILL AIR PNEUMATIC (MISCELLANEOUS) ×2 IMPLANT
DRAPE C-ARM 42X72 X-RAY (DRAPES) ×6 IMPLANT
DRAPE C-ARMOR (DRAPES) ×2 IMPLANT
DRAPE HALF SHEET 40X57 (DRAPES) IMPLANT
DRAPE LAPAROTOMY 100X72X124 (DRAPES) ×4 IMPLANT
DRAPE POUCH INSTRU U-SHP 10X18 (DRAPES) ×2 IMPLANT
DRAPE SHEET LG 3/4 BI-LAMINATE (DRAPES) ×8 IMPLANT
DRAPE SURG 17X23 STRL (DRAPES) ×2 IMPLANT
DRSG OPSITE POSTOP 4X6 (GAUZE/BANDAGES/DRESSINGS) ×2 IMPLANT
DRSG OPSITE POSTOP 4X8 (GAUZE/BANDAGES/DRESSINGS) IMPLANT
DURAPREP 26ML APPLICATOR (WOUND CARE) ×2 IMPLANT
ELECT BLADE 4.0 EZ CLEAN MEGAD (MISCELLANEOUS) ×4
ELECT BLADE 6.5 EXT (BLADE) ×2 IMPLANT
ELECT REM PT RETURN 9FT ADLT (ELECTROSURGICAL) ×4
ELECTRODE BLDE 4.0 EZ CLN MEGD (MISCELLANEOUS) ×2 IMPLANT
ELECTRODE REM PT RTRN 9FT ADLT (ELECTROSURGICAL) ×4 IMPLANT
EVACUATOR 1/8 PVC DRAIN (DRAIN) IMPLANT
EVACUATOR 3/16  PVC DRAIN (DRAIN) ×2
EVACUATOR 3/16 PVC DRAIN (DRAIN) ×2 IMPLANT
FEE COVERAGE SUPPORT O-ARM (MISCELLANEOUS) IMPLANT
GAUZE 4X4 16PLY ~~LOC~~+RFID DBL (SPONGE) IMPLANT
GAUZE SPONGE 4X4 12PLY STRL (GAUZE/BANDAGES/DRESSINGS) ×4 IMPLANT
GLOVE BIO SURGEON STRL SZ7 (GLOVE) IMPLANT
GLOVE BIO SURGEON STRL SZ7.5 (GLOVE) ×2 IMPLANT
GLOVE BIO SURGEON STRL SZ8 (GLOVE) ×8 IMPLANT
GLOVE BIOGEL PI IND STRL 7.0 (GLOVE) IMPLANT
GLOVE BIOGEL PI IND STRL 8 (GLOVE) ×2 IMPLANT
GLOVE BIOGEL PI IND STRL 8.5 (GLOVE) IMPLANT
GLOVE EXAM NITRILE XL STR (GLOVE) IMPLANT
GLOVE INDICATOR 8.5 STRL (GLOVE) ×12 IMPLANT
GOWN STRL REUS W/ TWL LRG LVL3 (GOWN DISPOSABLE) IMPLANT
GOWN STRL REUS W/ TWL XL LVL3 (GOWN DISPOSABLE) ×8 IMPLANT
GOWN STRL REUS W/TWL 2XL LVL3 (GOWN DISPOSABLE) ×2 IMPLANT
GOWN STRL REUS W/TWL LRG LVL3 (GOWN DISPOSABLE) ×2
GOWN STRL REUS W/TWL XL LVL3 (GOWN DISPOSABLE) ×10
GRAFT BNE CANC CHIPS 1-8 20CC (Bone Implant) IMPLANT
GRAFT BNE MATRIX VG FRMBL L 10 (Bone Implant) IMPLANT
INSERT FOGARTY 61MM (MISCELLANEOUS) IMPLANT
INSERT FOGARTY SM (MISCELLANEOUS) IMPLANT
KIT BASIN OR (CUSTOM PROCEDURE TRAY) ×4 IMPLANT
KIT INFUSE MEDIUM (Orthopedic Implant) IMPLANT
KIT TURNOVER KIT B (KITS) ×2 IMPLANT
MARKER SPHERE PSV REFLC NDI (MISCELLANEOUS) ×10 IMPLANT
NDL HYPO 21X1.5 SAFETY (NEEDLE) IMPLANT
NDL HYPO 25X1 1.5 SAFETY (NEEDLE) ×2 IMPLANT
NDL SPNL 18GX3.5 QUINCKE PK (NEEDLE) ×2 IMPLANT
NEEDLE HYPO 21X1.5 SAFETY (NEEDLE) ×2 IMPLANT
NEEDLE HYPO 25X1 1.5 SAFETY (NEEDLE) ×2 IMPLANT
NEEDLE SPNL 18GX3.5 QUINCKE PK (NEEDLE) ×2 IMPLANT
NS IRRIG 1000ML POUR BTL (IV SOLUTION) ×4 IMPLANT
OIL CARTRIDGE MAESTRO DRILL (MISCELLANEOUS)
PACK LAMINECTOMY NEURO (CUSTOM PROCEDURE TRAY) ×4 IMPLANT
PAD ARMBOARD 7.5X6 YLW CONV (MISCELLANEOUS) ×10 IMPLANT
PIN BONE FIX 150 (PIN) IMPLANT
ROD CREO 125MM SPINAL (Rod) IMPLANT
ROD SPINAL THRD DISP (MISCELLANEOUS) IMPLANT
SCREW THRD CREO 7.5X65 (Screw) IMPLANT
SPACER HEDRON IA 24X30X11 15D (Spacer) IMPLANT
SPIKE FLUID TRANSFER (MISCELLANEOUS) ×2 IMPLANT
SPONGE INTESTINAL PEANUT (DISPOSABLE) ×8 IMPLANT
SPONGE SURGIFOAM ABS GEL 100 (HEMOSTASIS) ×6 IMPLANT
SPONGE T-LAP 18X18 ~~LOC~~+RFID (SPONGE) ×2 IMPLANT
SPONGE T-LAP 4X18 ~~LOC~~+RFID (SPONGE) IMPLANT
STAPLER VISISTAT 35W (STAPLE) IMPLANT
STRIP CLOSURE SKIN 1/2X4 (GAUZE/BANDAGES/DRESSINGS) ×4 IMPLANT
SUPPORT TECH COVERAGE MED NAV (MISCELLANEOUS) ×2
SUT PDS AB 1 CTX 36 (SUTURE) IMPLANT
SUT PROLENE 4 0 RB 1 (SUTURE)
SUT PROLENE 4-0 RB1 .5 CRCL 36 (SUTURE) IMPLANT
SUT PROLENE 5 0 CC1 (SUTURE) IMPLANT
SUT PROLENE 6 0 C 1 30 (SUTURE) IMPLANT
SUT PROLENE 6 0 CC (SUTURE) IMPLANT
SUT SILK 0 TIES 10X30 (SUTURE) IMPLANT
SUT SILK 2 0 TIES 10X30 (SUTURE) ×2 IMPLANT
SUT SILK 2 0SH CR/8 30 (SUTURE) IMPLANT
SUT SILK 3 0 SH CR/8 (SUTURE) IMPLANT
SUT SILK 3 0 TIES 17X18 (SUTURE)
SUT SILK 3 0SH CR/8 30 (SUTURE) IMPLANT
SUT SILK 3-0 18XBRD TIE BLK (SUTURE) IMPLANT
SUT VIC AB 0 CT1 18XCR BRD8 (SUTURE) ×4 IMPLANT
SUT VIC AB 0 CT1 8-18 (SUTURE) ×4
SUT VIC AB 2-0 CT1 18 (SUTURE) ×4 IMPLANT
SUT VIC AB 4-0 PS2 27 (SUTURE) ×2 IMPLANT
SUT VICRYL 4-0 PS2 18IN ABS (SUTURE) ×2 IMPLANT
SYR 20ML LL LF (SYRINGE) IMPLANT
TAP 5.5 GI1 (TAP) IMPLANT
TAP 6.5 GI1 (TAP) IMPLANT
TAP 7.5 GI1 (TAP) IMPLANT
TOWEL GREEN STERILE (TOWEL DISPOSABLE) ×6 IMPLANT
TOWEL GREEN STERILE FF (TOWEL DISPOSABLE) ×4 IMPLANT
TRAY FOLEY MTR SLVR 16FR STAT (SET/KITS/TRAYS/PACK) ×4 IMPLANT
WATER STERILE IRR 1000ML POUR (IV SOLUTION) ×4 IMPLANT

## 2022-07-30 NOTE — H&P (Signed)
History and Physical Interval Note:  07/30/2022 7:25 AM  [name]  has presented today for surgery, with the diagnosis of Psuedoarthrosis.  The various methods of treatment have been discussed with the patient and family. After consideration of risks, benefits and other options for treatment, the patient has consented to  Procedure(s): ALIF - L5-S1 posterior augmentation with iliac screws and O- arm (N/A) LAMINECTOMY WITH POSTERIOR LATERAL ARTHRODESIS LEVEL 1 (N/A) Application of O-Arm (N/A) ABDOMINAL EXPOSURE (N/A) as a surgical intervention.  The patient's history has been reviewed, patient examined, no change in status, stable for surgery.  I have reviewed the patient's chart and labs.  Questions were answered to the patient's satisfaction.    Abdominal exposure for L5-S1 ALIF.  Risk and benefits again discussed.  Marty Heck    Patient name: RALLY OUCH           MRN: [id number]        DOB: 11-08-1946        Sex: male   REASON FOR CONSULT: Evaluate for abdominal exposure for L5-S1 ALIF   HPI: STEPEHN ECKARD is a 76 y.o. male, with history of hypertension and diabetes that presents for evaluation of anterior spine exposure for L5-S1 ALIF.  Patient describes chronic debilitating lower back pain.  He has been evaluated Dr. Saintclair Halsted who has recommended an L5-S1 ALIF and asked vascular surgery to assist with abdominal exposure.  Previous abdominal surgery includes laparoscopic cholecystectomy.  Imaging shows he had previous posterior fusion from L2-S1.       Past Medical History:  Diagnosis Date   Anemia     Arthritis      hands, knees, cervical area. Back pain. Rheumatoid arthritis- weekly injections.   BPH (benign prostatic hypertrophy)     Cancer (HCC)     Diabetes (HCC)     Essential tremor     GAD (generalized anxiety disorder) 12/05/2021   GERD (gastroesophageal reflux disease)      reports for indigestion he uses mustard    Hearing deficit      wears hearing aids  bilateral   HTN (hypertension)     Obesity     OSA (obstructive sleep apnea)     Refusal of blood transfusions as patient is Jehovah's Witness     Rheumatoid arthritis ([place])     Right bundle branch block      history of   Seizures ([place])      AS A CHILD. only esential tremors now.   Sleep apnea      cpap - settings at 9 per patient    Ulcer             Past Surgical History:  Procedure Laterality Date   ANTERIOR CERVICAL DECOMP/DISCECTOMY FUSION N/A 03/30/2013    Procedure: ANTERIOR CERVICAL DECOMPRESSION/DISCECTOMY FUSION 2 LEVELS;  Surgeon: Otilio Connors, MD;  Location: [facility];  Service: Neurosurgery;  Laterality: N/A;  C4-5 C5-6 Anterior cervical decompression/diskectomy/fusion/Allograft/Plate   CHOLECYSTECTOMY N/A 12/07/2013    Procedure: LAPAROSCOPIC CHOLECYSTECTOMY WITH INTRAOPERATIVE CHOLANGIOGRAM;  Surgeon: Adin Hector, MD;  Location: [facility];  Service: General;  Laterality: N/A;   COLONOSCOPY       DECOMPRESSIVE LUMBAR LAMINECTOMY LEVEL 2 N/A 02/15/2015    Procedure: COMPLETE DECOMPRESSIVE LUMBAR LAMINECTOMY L4-L5/ FORAMINOTOMY TO L4 NERVE ROOT AND L5 NERVE ROOT BILATERALLY;  Surgeon: [name], MD;  Location: [facility];  Service: Orthopedics;  Laterality: N/A;   EYE SURGERY  right, growth excision   LUMBAR LAMINECTOMY/DECOMPRESSION MICRODISCECTOMY Left 01/18/2016    Procedure:  DECOMPRESSION L4-L5 MICRODISCECTOMY L4-L5 ON LEFT FOR SPINAL STENOSIS;  Surgeon: [name], MD;  Location: [facility];  Service: Orthopedics;  Laterality: Left;   PAROTIDECTOMY Left 01/24/2018    Procedure: INCISIONAL BIOPSY OF LEFT PAROTID    MASS;  Surgeon: Helayne Seminole, MD;  Location: [facility];  Service: ENT;  Laterality: Left;   SPINE SURGERY       TONSILLECTOMY       VASECTOMY       WRIST GANGLION EXCISION Left             Family History  Problem Relation Age of Onset   Colon cancer Maternal Grandfather     Kidney disease Brother     Hypertension Brother     Diabetes  Brother     Stroke Maternal Grandmother     Diabetes Mother     Hypertension Mother     CAD Father          died of MI at age 88   Hypertension Father     Diabetes Paternal Aunt          x 4 aunts      SOCIAL HISTORY: Social History         Socioeconomic History   Marital status: Married      Spouse name: Not on file   Number of children: 2   Years of education: Not on file   Highest education level: Not on file  Occupational History   Occupation: bus driver      Employer: RETIRED  Tobacco Use   Smoking status: Former      Packs/day: 0.50      Years: 14.00      Total pack years: 7.00      Types: Cigarettes      Quit date: 12/14/1972      Years since quitting: 49.6   Smokeless tobacco: Never  Vaping Use   Vaping Use: Never used  Substance and Sexual Activity   Alcohol use: Yes      Comment: occasionally   Drug use: No   Sexual activity: Yes      Birth control/protection: None  Other Topics Concern   Not on file  Social History Narrative    Lives with wife    Social Determinants of Health    Financial Resource Strain: Not on file  Food Insecurity: Not on file  Transportation Needs: Not on file  Physical Activity: Not on file  Stress: Not on file  Social Connections: Not on file  Intimate Partner Violence: Not on file           Allergies  Allergen Reactions   Terazosin Other (See Comments)      Hypotension   Adalimumab Other (See Comments)      (Humira) Lymphoma   Statins Other (See Comments)      Memory issues   Ampicillin Nausea Only   Lisinopril Cough   Other Other (See Comments)      BLOOD PRODUCT REFUSAL (patient is a Sales promotion account executive Witness)              Current Outpatient Medications  Medication Sig Dispense Refill   dabigatran (PRADAXA) 150 MG CAPS capsule Take 1 capsule (150 mg total) by mouth 2 (two) times daily. 60 capsule 3   diltiazem (CARDIZEM CD) 120 MG 24 hr capsule Take 1 capsule (120 mg total) by mouth daily. [place]  capsule 2    furosemide (LASIX) 20 MG tablet Take 1 tablet (20 mg total) by mouth daily. 90 tablet 2   metoprolol (TOPROL-XL) 200 MG 24 hr tablet Take 200 mg by mouth daily.       primidone (MYSOLINE) 50 MG tablet Take 150 mg by mouth 2 (two) times daily.       Tamsulosin HCl (FLOMAX) 0.4 MG CAPS Take 0.4 mg by mouth at bedtime.       acetaminophen (TYLENOL) 500 MG tablet Take 1,000 mg by mouth daily as needed for moderate pain. (Patient not taking: Reported on 07/24/2022)       hydrALAZINE (APRESOLINE) 50 MG tablet Take 1 tablet (50 mg total) by mouth in the morning and at bedtime. (Patient not taking: Reported on 07/24/2022) 60 tablet 3   LACTOBACILLUS PO Take 1 capsule by mouth 2 (two) times daily. (Patient not taking: Reported on 07/24/2022)       metoprolol succinate (TOPROL-XL) 100 MG 24 hr tablet TAKE 1 TABLET BY MOUTH ONCE DAILY WITH  OR  IMMEDIATELY  FOLLOWING  A  MEAL (Patient not taking: Reported on 07/18/2022) 90 tablet 2   Multiple Vitamin (MULTIVITAMIN WITH MINERALS) TABS tablet Take 1 tablet by mouth daily. (Patient not taking: Reported on 07/24/2022)       nitroGLYCERIN (NITROSTAT) 0.4 MG SL tablet Place 0.4 mg under the tongue every 5 (five) minutes as needed for chest pain. (Patient not taking: Reported on 07/24/2022)       potassium chloride (KLOR-CON) 10 MEQ tablet Take 2 tablets (20 mEq total) by mouth daily for 7 days. (Patient not taking: Reported on 07/18/2022) 14 tablet 0    No current facility-administered medications for this visit.      REVIEW OF SYSTEMS:  '[X]'$  denotes positive finding, '[ ]'$  denotes negative finding Cardiac   Comments:  Chest pain or chest pressure:      Shortness of breath upon exertion:      Short of breath when lying flat:      Irregular heart rhythm:             Vascular      Pain in calf, thigh, or hip brought on by ambulation:      Pain in feet at night that wakes you up from your sleep:       Blood clot in your veins:      Leg swelling:              Pulmonary       Oxygen at home:      Productive cough:       Wheezing:              Neurologic      Sudden weakness in arms or legs:       Sudden numbness in arms or legs:       Sudden onset of difficulty speaking or slurred speech:      Temporary loss of vision in one eye:       Problems with dizziness:              Gastrointestinal      Blood in stool:       Vomited blood:              Genitourinary      Burning when urinating:       Blood in urine:             Psychiatric  Major depression:              Hematologic      Bleeding problems:      Problems with blood clotting too easily:             Skin      Rashes or ulcers:             Constitutional      Fever or chills:          PHYSICAL EXAM:    Vitals:    07/24/22 1548  BP: (!) 151/69  Pulse: (!) 55  Resp: 16  Temp: (!) 97.4 F (36.3 C)  TempSrc: Temporal  SpO2: 93%  Weight: 216 lb (98 kg)  Height: 5' 5.5" (1.664 m)      GENERAL: The patient is a well-nourished male, in no acute distress. The vital signs are documented above. CARDIAC: There is a regular rate and rhythm.  VASCULAR:  Palpable femoral pulses bilaterally Right DP palpable Left PT palpable PULMONARY: No respiratory distress. ABDOMEN: Soft and non-tender. MUSCULOSKELETAL: There are no major deformities or cyanosis. NEUROLOGIC: No focal weakness or paresthesias are detected. SKIN: There are no ulcers or rashes noted. PSYCHIATRIC: The patient has a normal affect.   DATA:    MRI reviewed:      Assessment/Plan:   76 year old male with chronic debilitating lower back pain that presents for preop evaluation of anterior spine exposure for L5-S1 ALIF.  I reviewed his MRI and he does have a high takeoff of the hypogastric but I think we should be able to get an anterior spine exposure at L5-S1.  He is Jehovah's Witness so I discussed he would be taking some increased risk with this procedure particularly if we run into any significant bleeding  that would require transfusion.  He is adamant against any blood transfusion.  States his quality of life is quite miserable.  I discussed transverse incision over the left rectus muscle and then entering the retroperitoneum to mobilize peritoneum and left ureter to the midline and then mobilizing possibly iliac artery and vein to get the L5-S1 disc space exposed from the front.  Questions answered and look forward to helping Dr. Saintclair Halsted on Monday.     Marty Heck, MD [facility] Office: [phone]

## 2022-07-30 NOTE — Transfer of Care (Signed)
Immediate Anesthesia Transfer of Care Note  Patient: Bryan [name]  Procedure(s) Performed: Anterior Lumbar  Interbody Fusion  - Lumbar five-sacral one posterior augmentation with iliac screws and O- arm LAMINECTOMY WITH POSTERIOR LATERAL ARTHRODESIS LUMBAR FIVE SACRAL ONE (Back) Application of O-Arm ABDOMINAL EXPOSURE  Patient Location: PACU  Anesthesia Type:General  Level of Consciousness: drowsy and patient cooperative  Airway & Oxygen Therapy: Patient Spontanous Breathing and Patient connected to face mask oxygen  Post-op Assessment: Report given to RN and Post -op Vital signs reviewed and stable  Post vital signs: Reviewed and stable  Last Vitals:  Vitals Value Taken Time  BP 185/74 07/30/22 1509  Temp    Pulse 58 07/30/22 1515  Resp 17 07/30/22 1515  SpO2 92 % 07/30/22 1515  Vitals shown include unvalidated device data.  Last Pain:  Vitals:   07/30/22 0626  TempSrc:   PainSc: 6          Complications: No notable events documented.

## 2022-07-30 NOTE — Anesthesia Postprocedure Evaluation (Signed)
Anesthesia Post Note  Patient: MALCOM SELMER  Procedure(s) Performed: Anterior Lumbar  Interbody Fusion  - Lumbar five-sacral one posterior augmentation with iliac screws and O- arm LAMINECTOMY WITH POSTERIOR LATERAL ARTHRODESIS LUMBAR FIVE SACRAL ONE (Back) Application of O-Arm ABDOMINAL EXPOSURE     Patient location during evaluation: PACU Anesthesia Type: General Level of consciousness: awake and alert Pain management: pain level controlled Vital Signs Assessment: post-procedure vital signs reviewed and stable Respiratory status: spontaneous breathing, nonlabored ventilation, respiratory function stable and patient connected to nasal cannula oxygen Cardiovascular status: blood pressure returned to baseline and stable Postop Assessment: no apparent nausea or vomiting Anesthetic complications: no   No notable events documented.  Last Vitals:  Vitals:   07/30/22 1517 07/30/22 1530  BP: (!) 190/85 (!) 182/74  Pulse:  (!) 54  Resp:  17  Temp:    SpO2:  95%    Last Pain:  Vitals:   07/30/22 1530  TempSrc:   PainSc: Asleep                 Ricci Dirocco S

## 2022-07-30 NOTE — Plan of Care (Signed)

## 2022-07-30 NOTE — Op Note (Signed)
Date: July 30, 2022  Preoperative diagnosis: Chronic lower back pain  Postoperative diagnosis: Same  Procedure: Anterior spine exposure at the L5-S1 disc space via anterior retroperitoneal approach for L5-S1 ALIF  Surgeon: Dr. Marty Heck, MD  Co-surgeon: Dr. Kary Kos, MD  Indications: 76 year old male with chronic lower back pain that has had multiple previous interventions including L2-S1 posterior fusion.  He presents for L5-S1 ALIF and vascular surgery was asked to assist with anterior spine exposure after risks and benefits discussed.  Findings: The L5-S1 disc space was marked over the left rectus muscle with a fluoroscopic C arm.  Transverse incision was made over the left rectus muscle and ultimately the rectus muscle was mobilized to the midline and entered the retroperitoneum and mobilized peritoneum and left ureter to the midline.  Middle sacral vessels were divided between clips.  Mobilized on both sides of the disc space.  The left iliac began was significantly scarred in place given previous posterior instrumentation.  I did have to repair a small tear off a branch off the left iliac vein with a 5-0 Prolene.  Ultimately fixed NuVasive retractor was placed and confirmed we were at the correct level of lateral fluoroscopy.  Anesthesia: General  Details: Patient was taken to the operating room after informed consent was obtained.  Placed on the operative table in the supine position.  General endotracheal anesthesia was induced.  Fluoroscopic C-arm was used in the lateral position to mark the L5-S1 disc space over the left rectus muscle.  The abdominal wall was then prepped and draped in standard sterile fashion.  Antibiotics were given.  Timeout performed.  Initially made a transverse incision over the left rectus muscle.  Dissected down with Bovie cautery and the anterior rectus sheath on the left was opened transversely with cerebellum retractors.  I mobilized flaps  under the anterior rectus sheath.  I then mobilized the left rectus muscle to the midline and entered into the retroperitoneum mobilized peritoneum and left ureter to the midline.  The middle sacral vessels were divided between clips.  I then mobilized on both sides of the disc space while my co-surgeon held the peritoneum and left ureter with hand-held Wiley retractors.  Ultimately I got to the right of the disc space over the osteophyte and had good working room.  The left iliac vein was significantly scarred and and I had to bluntly dissect this with Kd and suction and it did not want to mobilize and was scarred in place.  I did tear a small branch off the left iliac vein and this had to be repaired with 5-0 prolene.  Ultimately I got a fixed NuVasive retractor placed.  A 160 reverse lip was placed to the right of the disc space and a 140 reverse lip to the left of the disc space.  Placed a 140 retractor cranial and a 160 retractor caudal.  We put a needle in the disc space and confirmed we were at the correct level.  Case was turned over to Dr. Saintclair Halsted.  Complication: None  Condition: Stable  Marty Heck, MD [facility] Office: [place]

## 2022-07-30 NOTE — Progress Notes (Signed)
Pharmacy Antibiotic Note  Bryan [name] is a 76 y.o. male admitted on 07/30/2022 with surgical prophylaxis.  Pharmacy has been consulted for vanc dosing.  Pt is s/p lumbar fusion. Had drain in place. Plan to cont vanc for prophylaxis until drain is removed.  Scr 1.56  Plan: Vanc 1g IV q24 F/u drain removal  Height: '5\' 5"'$  (165.1 cm) Weight: 93 kg (205 lb) IBW/kg (Calculated) : 61.5  Temp (24hrs), Avg:97.6 F (36.4 C), Min:97.5 F (36.4 C), Max:97.6 F (36.4 C)  No results for input(s): "WBC", "CREATININE", "LATICACIDVEN", "VANCOTROUGH", "VANCOPEAK", "VANCORANDOM", "GENTTROUGH", "GENTPEAK", "GENTRANDOM", "TOBRATROUGH", "TOBRAPEAK", "TOBRARND", "AMIKACINPEAK", "AMIKACINTROU", "AMIKACIN" in the last 168 hours.  Estimated Creatinine Clearance: 41.6 mL/min (A) (by C-G formula based on SCr of 1.56 mg/dL (H)).    Allergies  Allergen Reactions   Terazosin Other (See Comments)    Hypotension   Adalimumab Other (See Comments)    (Humira) Lymphoma   Statins Other (See Comments)    Memory issues   Ampicillin Nausea Only    Tolerated ancef on 07-30-22 without issue intraoperativelty   Lisinopril Cough   Other Other (See Comments)    BLOOD PRODUCT REFUSAL (patient is a Jehovah's Witness)     Antimicrobials this admission: 9/18 vanc>>  Onnie Boer, PharmD, BCIDP, AAHIVP, CPP Infectious Disease Pharmacist 07/30/2022 6:35 PM

## 2022-07-30 NOTE — Plan of Care (Signed)
  Problem: Education: Goal: Knowledge of General Education information will improve Description: Including pain rating scale, medication(s)/side effects and non-pharmacologic comfort measures Outcome: Progressing   Problem: Health Behavior/Discharge Planning: Goal: Ability to manage health-related needs will improve Outcome: Progressing   Problem: Clinical Measurements: Goal: Will remain free from infection Outcome: Progressing   Problem: Activity: Goal: Risk for activity intolerance will decrease Outcome: Progressing   Problem: Nutrition: Goal: Adequate nutrition will be maintained Outcome: Progressing   Problem: Coping: Goal: Level of anxiety will decrease Outcome: Progressing   Problem: Elimination: Goal: Will not experience complications related to bowel motility Outcome: Progressing   Problem: Pain Managment: Goal: General experience of comfort will improve Outcome: Progressing   

## 2022-07-30 NOTE — H&P (Signed)
Bryan [name] is an 76 y.o. male.   Chief Complaint: Back and bilateral hip and leg pain HPI: 76 year old gentleman with progressive worsening back pain bilateral hip and leg pain patient undergone previous L2-S1 fusion and did well initially however has had progressive worsening low back pain rating down L5-S1 nerve root pattern.  Work-up is revealed progressive and persistent pseudoarthrosis at L5-S1 with loosening of both bilateral L5 and S1 screws.  Due to patient's progression of clinical syndrome imaging findings failed conservative treatment I recommended anterior lateral interbody fusion at L5-S1 and then subsequent posterior exploration fusion removal of hardware and replacement of loose L5 and S1 screws with iliac fixation.  I have extensively gone over the risks and benefits of this operation with him as well as perioperative course expectations of outcome and alternatives of surgery and he understands and agrees to proceed forward.  Past Medical History:  Diagnosis Date   Anemia    Arthritis    hands, knees, cervical area. Back pain. Rheumatoid arthritis- weekly injections.   BPH (benign prostatic hypertrophy)    Cancer (HCC)    Diabetes (HCC)    Essential tremor    GAD (generalized anxiety disorder) 12/05/2021   GERD (gastroesophageal reflux disease)    reports for indigestion he uses mustard    Hearing deficit    wears hearing aids bilateral   HTN (hypertension)    Obesity    OSA (obstructive sleep apnea)    Refusal of blood transfusions as patient is Jehovah's Witness    Rheumatoid arthritis ([place])    Right bundle branch block    history of   Seizures ([place])    AS A CHILD. only esential tremors now.   Sleep apnea    cpap - settings at 9 per patient    Ulcer     Past Surgical History:  Procedure Laterality Date   ANTERIOR CERVICAL DECOMP/DISCECTOMY FUSION N/A 03/30/2013   Procedure: ANTERIOR CERVICAL DECOMPRESSION/DISCECTOMY FUSION 2 LEVELS;  Surgeon: Otilio Connors, MD;   Location: [facility];  Service: Neurosurgery;  Laterality: N/A;  C4-5 C5-6 Anterior cervical decompression/diskectomy/fusion/Allograft/Plate   CHOLECYSTECTOMY N/A 12/07/2013   Procedure: LAPAROSCOPIC CHOLECYSTECTOMY WITH INTRAOPERATIVE CHOLANGIOGRAM;  Surgeon: Adin Hector, MD;  Location: [facility];  Service: General;  Laterality: N/A;   COLONOSCOPY     DECOMPRESSIVE LUMBAR LAMINECTOMY LEVEL 2 N/A 02/15/2015   Procedure: COMPLETE DECOMPRESSIVE LUMBAR LAMINECTOMY L4-L5/ FORAMINOTOMY TO L4 NERVE ROOT AND L5 NERVE ROOT BILATERALLY;  Surgeon: [name], MD;  Location: [facility];  Service: Orthopedics;  Laterality: N/A;   EYE SURGERY     right, growth excision   LUMBAR LAMINECTOMY/DECOMPRESSION MICRODISCECTOMY Left 01/18/2016   Procedure:  DECOMPRESSION L4-L5 MICRODISCECTOMY L4-L5 ON LEFT FOR SPINAL STENOSIS;  Surgeon: [name], MD;  Location: [facility];  Service: Orthopedics;  Laterality: Left;   PAROTIDECTOMY Left 01/24/2018   Procedure: INCISIONAL BIOPSY OF LEFT PAROTID    MASS;  Surgeon: Helayne Seminole, MD;  Location: [facility];  Service: ENT;  Laterality: Left;   SPINE SURGERY     TONSILLECTOMY     VASECTOMY     WRIST GANGLION EXCISION Left     Family History  Problem Relation Age of Onset   Colon cancer Maternal Grandfather    Kidney disease Brother    Hypertension Brother    Diabetes Brother    Stroke Maternal Grandmother    Diabetes Mother    Hypertension Mother    CAD Father  died of MI at age 78   Hypertension Father    Diabetes Paternal Aunt        x 4 aunts   Social History:  reports that he quit smoking about 49 years ago. His smoking use included cigarettes. He has a 7.00 pack-year smoking history. He has never used smokeless tobacco. He reports current alcohol use. He reports that he does not use drugs.  Allergies:  Allergies  Allergen Reactions   Terazosin Other (See Comments)    Hypotension   Adalimumab Other (See Comments)    (Humira) Lymphoma   Statins  Other (See Comments)    Memory issues   Ampicillin Nausea Only   Lisinopril Cough   Other Other (See Comments)    BLOOD PRODUCT REFUSAL (patient is a Jehovah's Witness)     Medications Prior to Admission  Medication Sig Dispense Refill   acetaminophen (TYLENOL) 500 MG tablet Take 1,000 mg by mouth daily as needed for moderate pain.     dabigatran (PRADAXA) 150 MG CAPS capsule Take 1 capsule (150 mg total) by mouth 2 (two) times daily. 60 capsule 3   diltiazem (CARDIZEM CD) 120 MG 24 hr capsule Take 1 capsule (120 mg total) by mouth daily. 90 capsule 2   furosemide (LASIX) 20 MG tablet Take 1 tablet (20 mg total) by mouth daily. 90 tablet 2   metoprolol succinate (TOPROL-XL) 100 MG 24 hr tablet TAKE 1 TABLET BY MOUTH ONCE DAILY WITH  OR  IMMEDIATELY  FOLLOWING  A  MEAL 90 tablet 2   Multiple Vitamin (MULTIVITAMIN WITH MINERALS) TABS tablet Take 1 tablet by mouth daily.     nitroGLYCERIN (NITROSTAT) 0.4 MG SL tablet Place 0.4 mg under the tongue every 5 (five) minutes as needed for chest pain.     primidone (MYSOLINE) 50 MG tablet Take 150 mg by mouth 2 (two) times daily.     Tamsulosin HCl (FLOMAX) 0.4 MG CAPS Take 0.4 mg by mouth at bedtime.     hydrALAZINE (APRESOLINE) 50 MG tablet Take 1 tablet (50 mg total) by mouth in the morning and at bedtime. (Patient not taking: Reported on 07/26/2022) 60 tablet 3   LACTOBACILLUS PO Take 1 capsule by mouth 2 (two) times daily.     potassium chloride (KLOR-CON) 10 MEQ tablet Take 2 tablets (20 mEq total) by mouth daily for 7 days. (Patient not taking: Reported on 07/18/2022) 14 tablet 0    No results found for this or any previous visit (from the past 48 hour(s)). No results found.  Review of Systems  Blood pressure (!) 149/75, pulse (!) 56, temperature 97.6 F (36.4 C), temperature source Oral, resp. rate 18, height '5\' 5"'$  (1.651 m), weight 93 kg, SpO2 98 %. Physical Exam   Assessment/Plan 76 year old presents for anterior lumbar interbody  fusion L5-S1 with posterior reexploration fusion removal hardware and redo posterior lateral arthrodesis with iliac fixation.  Elaina Hoops, MD 07/30/2022, 7:26 AM

## 2022-07-30 NOTE — Anesthesia Procedure Notes (Signed)
Arterial Line Insertion Start/End9/18/2023 7:15 AM Performed by: Janace Litten, CRNA, CRNA  Patient location: Pre-op. Preanesthetic checklist: patient identified, IV checked, surgical consent, monitors and equipment checked and pre-op evaluation Lidocaine 1% used for infiltration Left, radial was placed Catheter size: 20 G Hand hygiene performed  and maximum sterile barriers used   Attempts: 1 Procedure performed without using ultrasound guided technique. Following insertion, dressing applied and Biopatch. Post procedure assessment: normal  Patient tolerated the procedure well with no immediate complications.

## 2022-07-30 NOTE — Anesthesia Procedure Notes (Signed)
Procedure Name: Intubation Date/Time: 07/30/2022 7:56 AM  Performed by: Gwyndolyn Saxon, CRNAPre-anesthesia Checklist: Patient identified, Emergency Drugs available, Suction available and Patient being monitored Patient Re-evaluated:Patient Re-evaluated prior to induction Oxygen Delivery Method: Circle system utilized Preoxygenation: Pre-oxygenation with 100% oxygen Induction Type: IV induction Ventilation: Oral airway inserted - appropriate to patient size and Mask ventilation without difficulty Laryngoscope Size: Miller and 2 Grade View: Grade I Tube type: Oral Tube size: 7.5 mm Number of attempts: 1 Airway Equipment and Method: Patient positioned with wedge pillow and Stylet Placement Confirmation: ETT inserted through vocal cords under direct vision, positive ETCO2 and breath sounds checked- equal and bilateral Secured at: 23 cm Tube secured with: Tape Dental Injury: Teeth and Oropharynx as per pre-operative assessment

## 2022-07-30 NOTE — Op Note (Signed)
Preoperative diagnosis: Degenerative disc disease L5-S1 pseudoarthrosis L5-S1.  Postoperative diagnosis: Same.  Procedure: #1 anterior lumbar interbody fusion utilizing the globus titanium cage packed with the Osteocel pro and the integrated Shanks.  2.  Expiration fusion move of hardware with exploring his previous L2-S1 fusion disconnecting the rods and knots removing loose screws at L5 and S1.  3.  Utilizing O-arm stereotactic Stealth navigation placement of bilateral S2 AI screws for pelvic fixation and tying into previous construct utilizing cortical screws at L2-L3 and L4 on the patient's right S2 AI screws were globus 7.5 mm diameter 65 mm length  4.  Posterior lateral arthrodesis L4-S1 utilizing locally harvested autograft mixed with BMP cancellous chips and Vivigen.  Surgeon: Kary Kos.  Assistant: Nash Shearer.  Anesthesia: General..  EBL: 400 for the entire procedure with 200 Cell Saver given back  HPI: 76 year old gentleman previously undergone L2-S1 fusion and and subsequent revision for pseudoarthrosis at L5-S1 however has had persistent pseudoarthrosis and back pain and imaging revealed severe loosening of his L5 and S1 screws.  Due to patient's progression of clinical syndrome imaging findings and failure of conservative treatment I recommended anterior lumbar interbody fusion L5-S1 with expiration fusion removal of hardware posteriorly placement of iliac fixation screws and tying into his old construct.  I extensively reviewed the risks and benefits of that operation with him as well as perioperative course expectations of outcome and alternatives of surgery and he understood and agreed to proceed forward.[name]  Operative procedure: Patient was brought into the OR was [place] general anesthesia positioned the first part of the operation stage I supine for the anterior lumbar approach.  Exposure will be dictated by Dr. Monica Martinez and after adequate exposure at L5-S1 was  achieved fluoroscopy confirmed the correct level and midline disc base was incised and cleaned out pituitary rongeurs and curettes.  Utilizing fluoroscopy I confirmed depth and performed a virtual complete discectomy and due to the exposure and the left iliac vein limiting with we had to go with a small footprint cage but a 11 mm 15 degree small footprint cage was packed with the Osteocel pro and this was inserted at L5-S1.  Henriette Combs were deployed under fluoroscopy and everything appeared to be good in good position.  The wound was then closed with a running PDS and the external beak fascia and a 2-0 Vicryl running 4 subcuticular in the skin patient was then remitted for repositioning.  After repositioning prone on the Baldwin table his old incision was opened up and extended inferiorly after adequate prepping and draping.  The subperiosteal dissection was carried out exposing the lower sacrum and the hardware from L2 down to S1.  I then disconnected the hardware remove the rods and removed bilateral loose L5 and S1 screws and they were markedly loose.  The other screws appear to be solid I dissected out the posterior lateral bone which was felt to be incompletely fused.  I exposed the TPs from L4 down to the sacral ala and after all that exposure been achieved utilizing the O-arm Stealth navigation I placed the reference array on the L1 spinous process and performed a image capture and then with adequate registration I then navigated placement of bilateral S2 AI screws.  We utilized the navigated drill navigated awl, navigated tap, and navigated screw.  All screws had excellent purchase.  I then did a postplacement image that confirmed good positioning of all the hardware we then removed the array and advanced the screws slightly aggressively  decorticated all the lateral facet joints and TPs from L4 down to the sacral ala packed and extensive mount of BMP cancellous chips and Vivigen posterior laterally.  We then  reinspected the screws were left behind which were L2 and L3 bilaterally and L4 on the right and all these appeared to still be solid and excellent purchase I then selected 120 mm rods and attached everything in place.  Anchored everything together placed a medium Hemovac drain injected Exparel in the fascia and closed the wound in layers with interrupted Vicryl and a running 4 subcuticular.  Dermabond benzoin Steri-Strips and a sterile dressing was applied patient recovery in stable condition.  At the end the case all needle counts and sponge counts were correct.

## 2022-07-31 MED ORDER — PANTOPRAZOLE SODIUM 40 MG PO TBEC
40.0000 mg | DELAYED_RELEASE_TABLET | Freq: Every day | ORAL | Status: DC
  Administered 2022-07-31 – 2022-08-02 (×3): 40 mg via ORAL
  Filled 2022-07-31 (×3): qty 1

## 2022-07-31 MED FILL — Thrombin For Soln 20000 Unit: CUTANEOUS | Qty: 1 | Status: AC

## 2022-07-31 NOTE — Progress Notes (Signed)
[facility]  Subjective  - no complaints.  No n/v overnight.   Objective (!) 121/57 (!) 57 97.6 F (36.4 C) 16 94%  Intake/Output Summary (Last 24 hours) at 07/31/2022 0614 Last data filed at 07/30/2022 2015 Gross per 24 hour  Intake 3710 ml  Output 1850 ml  Net 1860 ml    Abdomen soft with appropriate postop incisional tenderness. Left PT palpable  Laboratory Lab Results: No results for input(s): "WBC", "HGB", "HCT", "PLT" in the last 72 hours. BMET No results for input(s): "NA", "K", "CL", "CO2", "GLUCOSE", "BUN", "CREATININE", "CALCIUM" in the last 72 hours.  COAG Lab Results  Component Value Date   INR 1.0 02/22/2020   INR 0.99 12/03/2017   INR 1.16 01/06/2016   No results found for: "PTT"  Assessment/Planning:  Postop day 1 status post anterior spine exposure for L5-S1 ALIF.  Looks good this morning.  No nausea vomiting.  Appropriate postop abdominal incisional tenderness.  Left PT palpable.  Hemodynamic stable.  Marty Heck 07/31/2022 6:14 AM --

## 2022-07-31 NOTE — Plan of Care (Signed)
  Problem: Education: Goal: Knowledge of General Education information will improve Description: Including pain rating scale, medication(s)/side effects and non-pharmacologic comfort measures 07/31/2022 0750 by Rodell Perna, RN Outcome: Progressing 07/30/2022 1806 by Rodell Perna, RN Outcome: Progressing   Problem: Health Behavior/Discharge Planning: Goal: Ability to manage health-related needs will improve Outcome: Progressing   Problem: Clinical Measurements: Goal: Will remain free from infection 07/31/2022 0750 by Rodell Perna, RN Outcome: Progressing 07/30/2022 1806 by Rodell Perna, RN Outcome: Progressing   Problem: Activity: Goal: Risk for activity intolerance will decrease 07/31/2022 0750 by Rodell Perna, RN Outcome: Progressing 07/30/2022 1806 by Rodell Perna, RN Outcome: Progressing   Problem: Nutrition: Goal: Adequate nutrition will be maintained 07/31/2022 0750 by Rodell Perna, RN Outcome: Progressing 07/30/2022 1806 by Rodell Perna, RN Outcome: Progressing   Problem: Coping: Goal: Level of anxiety will decrease 07/31/2022 0750 by Rodell Perna, RN Outcome: Progressing 07/30/2022 1806 by Rodell Perna, RN Outcome: Progressing   Problem: Elimination: Goal: Will not experience complications related to bowel motility 07/31/2022 0750 by Rodell Perna, RN Outcome: Progressing 07/30/2022 1806 by Rodell Perna, RN Outcome: Progressing   Problem: Pain Managment: Goal: General experience of comfort will improve 07/31/2022 0750 by Rodell Perna, RN Outcome: Progressing 07/30/2022 1806 by Rodell Perna, RN Outcome: Progressing

## 2022-07-31 NOTE — Evaluation (Signed)
Physical Therapy Evaluation Patient Details Name: Bryan [name] MRN: [id number] DOB: 1945/12/05 Today's Date: 07/31/2022  History of Present Illness  Pt is a 76 y.o. M who presents with progressive worsening back pain; Work-up is revealed progressive and persistent pseudoarthrosis at L5-S1 with loosening of both bilateral L5 and S1 screws. Now s/p L5-S1 ALIF. Significant PMH: prior L2-S1 fusion, CA, HTN, RA, ACDF. Pt is also a Jehov[employer]'s Witness.  Clinical Impression  PTA, pt lives with his spouse and is independent. Pt denies recent falls. Pt presents with decreased functional mobility secondary to pain, impaired standing balance, RLE functional weakness, and decreased activity tolerance. Pt reporting back pain is worse currently than prior multi level surgery in 2022. Denies radicular symptoms. Pt requiring min-mod assist for functional mobility. Able to perform stand pivot to chair using walker. Unable to progress ambulation due to consistent right knee buckle and pt report of nausea/dizziness. BP stable 138/76. Interestingly, pt has 5/5 right quadriceps strength. May need to test sensation/proprioception further. Currently recommend AIR to address deficits, maximize functional mobility and decrease caregiver burden.     Recommendations for follow up therapy are one component of a multi-disciplinary discharge planning process, led by the attending physician.  Recommendations may be updated based on patient status, additional functional criteria and insurance authorization.  Follow Up Recommendations Acute inpatient rehab (3hours/day)      Assistance Recommended at Discharge Frequent or constant Supervision/Assistance  Patient can return home with the following  A little help with bathing/dressing/bathroom;A lot of help with walking and/or transfers;Assistance with cooking/housework;Assist for transportation;Help with stairs or ramp for entrance    Equipment Recommendations Rolling walker (2  wheels);BSC/3in1  Recommendations for Other Services  Rehab consult    Functional Status Assessment Patient has had a recent decline in their functional status and demonstrates the ability to make significant improvements in function in a reasonable and predictable amount of time.     Precautions / Restrictions Precautions Precautions: Back;Fall Precaution Booklet Issued: Yes (comment) Precaution Comments: verbally reviewed, provided written handout Required Braces or Orthoses:  (no brace needed) Restrictions Weight Bearing Restrictions: No      Mobility  Bed Mobility Overal bed mobility: Needs Assistance Bed Mobility: Rolling, Sidelying to Sit Rolling: Min assist Sidelying to sit: Min assist       General bed mobility comments: Cues for log roll technique. Pt requiring assist to roll into sidelying towards left, assist for RLE off edge of bed    Transfers Overall transfer level: Needs assistance Equipment used: Rolling walker (2 wheels) Transfers: Sit to/from Stand, Bed to chair/wheelchair/BSC Sit to Stand: Min assist Stand pivot transfers: Mod assist         General transfer comment: Pt with + R knee buckle, requiring up to modA to maintain upright and pivot to recliner    Ambulation/Gait               General Gait Details: unable  Stairs            Wheelchair Mobility    Modified Rankin (Stroke Patients Only)       Balance Overall balance assessment: Needs assistance Sitting-balance support: Feet supported Sitting balance-Leahy Scale: Fair     Standing balance support: Bilateral upper extremity supported Standing balance-Leahy Scale: Poor Standing balance comment: reliant on RW                             Pertinent Vitals/Pain Pain Assessment Pain  Assessment: 0-10 Pain Score: 8  Pain Location: back Pain Descriptors / Indicators: Discomfort, Grimacing, Operative site guarding Pain Intervention(s): Limited activity within  patient's tolerance, Monitored during session    [place] expects to be discharged to:: Private residence Living Arrangements: Spouse/significant other Available Help at Discharge: Family;Available 24 hours/day Type of Home: House Home Access: Stairs to enter   [employer] of Steps: 1   Home Layout: One level Home Equipment: Cane - single point      Prior Function Prior Level of Function : Independent/Modified Independent;Driving             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, newspaper   Dominant Hand: Right    Extremity/Trunk Assessment   Upper Extremity Assessment Upper Extremity Assessment: Defer to OT evaluation    Lower Extremity Assessment Lower Extremity Assessment: RLE deficits/detail;LLE deficits/detail RLE Deficits / Details: Grossly 5/5 except hip flexion 4/5 LLE Deficits / Details: Grossly 5/5 exception hip flexion 4/5    Cervical / Trunk Assessment Cervical / Trunk Assessment: Back Surgery  Communication   Communication: HOH  Cognition Arousal/Alertness: Awake/alert Behavior During Therapy: WFL for tasks assessed/performed Overall Cognitive Status: Within Functional Limits for tasks assessed                                          General Comments      Exercises     Assessment/Plan    PT Assessment Patient needs continued PT services  PT Problem List Decreased strength;Decreased activity tolerance;Decreased balance;Decreased mobility;Pain       PT Treatment Interventions DME instruction;Gait training;Stair training;Functional mobility training;Therapeutic activities;Therapeutic exercise;Balance training;Patient/family education    PT Goals (Current goals can be found in the Care Plan section)  Acute Rehab PT Goals Patient Stated Goal: to walk PT Goal Formulation: With patient Time For Goal Achievement: 08/14/22 Potential to Achieve Goals: Good    Frequency Min 5X/week      Co-evaluation               AM-PAC PT "6 Clicks" Mobility  Outcome Measure Help needed turning from your back to your side while in a flat bed without using bedrails?: A Little Help needed moving from lying on your back to sitting on the side of a flat bed without using bedrails?: A Little Help needed moving to and from a bed to a chair (including a wheelchair)?: A Lot Help needed standing up from a chair using your arms (e.g., wheelchair or bedside chair)?: A Little Help needed to walk in hospital room?: Total Help needed climbing 3-5 steps with a railing? : Total 6 Click Score: 13    End of Session Equipment Utilized During Treatment: Gait belt Activity Tolerance: Other (comment) (limited by nausea/dizziness) Patient left: in chair;with call bell/phone within reach;with family/visitor present Nurse Communication: Mobility status PT Visit Diagnosis: Unsteadiness on feet (R26.81);Difficulty in walking, not elsewhere classified (R26.2);Pain Pain - part of body:  (back)    Time: 8101-7510 PT Time Calculation (min) (ACUTE ONLY): 23 min   Charges:   PT Evaluation $PT Eval Moderate Complexity: 1 Mod PT Treatments $Therapeutic Activity: 8-22 mins        Bryan [name], PT, DPT Acute Rehabilitation Services Office [phone]   [name] 07/31/2022, 9:56 AM

## 2022-07-31 NOTE — Progress Notes (Signed)
Inpatient Rehab Admissions Coordinator:  ? ?Per therapy recommendations,  patient was screened for CIR candidacy by Lauraann Missey, MS, CCC-SLP. At this time, Pt. Appears to be a a potential candidate for CIR. I will place   order for rehab consult per protocol for full assessment. Please contact me any with questions. ? ?Jaevon Paras, MS, CCC-SLP ?Rehab Admissions Coordinator  ?[phone] (celll) ?[phone] (office) ? ?

## 2022-07-31 NOTE — Progress Notes (Signed)
Subjective: Patient reports moderate back pain but no leg pain   Objective: Vital signs in last 24 hours: Temp:  [97.5 F (36.4 C)-98.5 F (36.9 C)] 98.5 F (36.9 C) (09/19 0733) Pulse Rate:  [52-68] 68 (09/19 0733) Resp:  [12-19] 17 (09/19 0514) BP: (113-190)/(55-85) 114/55 (09/19 0733) SpO2:  [91 %-100 %] 96 % (09/19 0733) Arterial Line BP: (181-199)/(68-123) 184/68 (09/18 1600)  Intake/Output from previous day: 09/18 0701 - 09/19 0700 In: 3717.7 [I.V.:3407.7; Blood:170; IV Piggyback:140] Out: 3614 [Urine:1400; Drains:300; Blood:675] Intake/Output this shift: No intake/output data recorded.  Neurologic: Grossly normal  Lab Results: Lab Results  Component Value Date   WBC 4.9 07/23/2022   HGB 11.7 (L) 07/23/2022   HCT 36.4 (L) 07/23/2022   MCV 97.1 07/23/2022   PLT 232 07/23/2022   Lab Results  Component Value Date   INR 1.0 02/22/2020   BMET Lab Results  Component Value Date   NA 141 07/23/2022   K 3.6 07/23/2022   CL 107 07/23/2022   CO2 28 07/23/2022   GLUCOSE 103 (H) 07/23/2022   BUN 17 07/23/2022   CREATININE 1.56 (H) 07/23/2022   CALCIUM 8.5 (L) 07/23/2022    Studies/Results: DG Lumbar Spine 1 View  Result Date: 07/30/2022 CLINICAL DATA:  Lumbar fusion EXAM: LUMBAR SPINE - 1 VIEW COMPARISON:  05/03/2022 lumbar spine radiographs FINDINGS: One fluoroscopic image obtained during the performance of the procedure and provided for interpretation only. Imaged demonstrates anterior lumbar interbody fusion at L5-S1. Fluoroscopy time: 29 seconds Cumulative air kerma: 34.86 mGy IMPRESSION: Expected intraoperative appearance a ALIF L5-S1. Electronically Signed   By: Merilyn Baba M.D.   On: 07/30/2022 14:56   DG O-ARM IMAGE ONLY/NO REPORT  Result Date: 07/30/2022 There is no Radiologist interpretation  for this exam.  DG C-Arm 1-60 Min-No Report  Result Date: 07/30/2022 Fluoroscopy was utilized by the requesting physician.  No radiographic interpretation.    DG C-Arm 1-60 Min-No Report  Result Date: 07/30/2022 Fluoroscopy was utilized by the requesting physician.  No radiographic interpretation.   DG C-Arm 1-60 Min-No Report  Result Date: 07/30/2022 Fluoroscopy was utilized by the requesting physician.  No radiographic interpretation.   DG C-Arm 1-60 Min-No Report  Result Date: 07/30/2022 Fluoroscopy was utilized by the requesting physician.  No radiographic interpretation.   DG C-Arm 1-60 Min-No Report  Result Date: 07/30/2022 Fluoroscopy was utilized by the requesting physician.  No radiographic interpretation.   DG OR LOCAL ABDOMEN  Result Date: 07/30/2022 CLINICAL DATA:  Lumbar fusion.  Rule out retained foreign body. EXAM: OR LOCAL ABDOMEN COMPARISON:  07/22/2018 FINDINGS: Lumbar fusion hardware without complicating features. No unexpected radiopaque foreign body. IMPRESSION: No unexpected radiopaque foreign body. Electronically Signed   By: Marijo Sanes M.D.   On: 07/30/2022 10:25    Assessment/Plan: Postop day 1 anterior/posterior lumbar fusion. Doing well this morning. Work on mobilization today    LOS: 1 day    Bryan [name] 07/31/2022, 8:02 AM

## 2022-07-31 NOTE — Evaluation (Signed)
Occupational Therapy Evaluation Patient Details Name: Bryan [name] MRN: [id number] DOB: 1946-05-01 Today's Date: 07/31/2022   History of Present Illness Pt is a 76 year old male s/p L5-S1 ALIF 07/30/22; PMH significant for stage IIb marginal zone lymphoma, rheumatoid arthritis, anemia, CAD with mild ischemia (04/2020), paroxysmal atrial flutter, h/o post-op PE-currently on Pradaxa, pulmonary hypertension.   Clinical Impression   Pt greeted in chair agreeable to OT evaluation. PTA pt was MOD I in ADL/IADL, amb with no AD. Pt reports requiring increased time for ADL due to pain, wife wold assist with LB dressing as needed. Pt presents with deficits in endurance, activity tolerance, balance affecting safe and optimal ADL completion.STS completed with MIN A, pt amb approx 6' with close chair follow with heavy support with BUE on RW with MIN A-MOD A. Pain throughout RLE limiting further mobility. Pt completes grooming tasks with SET UP in sitting. At this time recommend intensive rehab to facilitate return to [facility] and to address functional deficits. OT will continue to follow acutely.     Recommendations for follow up therapy are one component of a multi-disciplinary discharge planning process, led by the attending physician.  Recommendations may be updated based on patient status, additional functional criteria and insurance authorization.   Follow Up Recommendations  Acute inpatient rehab (3hours/day)    Assistance Recommended at Discharge Frequent or constant Supervision/Assistance  Patient can return home with the following A lot of help with walking and/or transfers;A lot of help with bathing/dressing/bathroom    Functional Status Assessment  Patient has had a recent decline in their functional status and demonstrates the ability to make significant improvements in function in a reasonable and predictable amount of time.  Equipment Recommendations  BSC/3in1;Tub/shower seat     Recommendations for Other Services       Precautions / Restrictions Precautions Precautions: Back;Fall Precaution Booklet Issued: Yes (comment) Precaution Comments: verbally reviewed, provided written handout Required Braces or Orthoses:  (no brace needed) Restrictions Weight Bearing Restrictions: No      Mobility Bed Mobility               General bed mobility comments: NT in recliner pre sesson; pt requests to remain in recliner post session    Transfers Overall transfer level: Needs assistance Equipment used: Rolling walker (2 wheels) Transfers: Sit to/from Stand Sit to Stand: Min assist                  Balance Overall balance assessment: Needs assistance Sitting-balance support: Feet supported Sitting balance-Leahy Scale: Fair     Standing balance support: Bilateral upper extremity supported, During functional activity, Reliant on assistive device for balance Standing balance-Leahy Scale: Poor                             ADL either performed or assessed with clinical judgement   ADL Overall ADL's : Needs assistance/impaired     Grooming: Wash/dry hands;Wash/dry face;Oral care;Sitting;Set up       Lower Body Bathing: Maximal assistance Lower Body Bathing Details (indicate cue type and reason): anticipated     Lower Body Dressing: Maximal assistance   Toilet Transfer: Minimal assistance;Moderate assistance           Functional mobility during ADLs: Minimal assistance-Moderate assistance;Rolling walker (2 wheels) (6' in room with RW with close chair follow; frequent vcs for technique/safety)       Vision Baseline Vision/History: 1 Wears glasses Patient Visual Report:  No change from baseline       Perception     Praxis      Pertinent Vitals/Pain Pain Assessment Pain Score: 7  Pain Location: lower back Pain Descriptors / Indicators: Discomfort, Grimacing Pain Intervention(s): Limited activity within patient's  tolerance, Repositioned, Monitored during session     Hand Dominance Right   Extremity/Trunk Assessment Upper Extremity Assessment Upper Extremity Assessment: Overall WFL for tasks assessed   Lower Extremity Assessment Lower Extremity Assessment: Defer to PT evaluation RLE Deficits / Details: Grossly 5/5 except hip flexion 4/5 LLE Deficits / Details: Grossly 5/5 exception hip flexion 4/5   Cervical / Trunk Assessment Cervical / Trunk Assessment: Back Surgery   Communication Communication Communication: HOH   Cognition Arousal/Alertness: Awake/alert Behavior During Therapy: WFL for tasks assessed/performed Overall Cognitive Status: Within Functional Limits for tasks assessed                                       General Comments       Exercises Other Exercises Other Exercises: edu re: role of OT, role of rehab, discharge recommendations, home safety, falls prevention, DME use, ADL with precautions   Shoulder Instructions      Home Living Family/patient expects to be discharged to:: Private residence Living Arrangements: Spouse/significant other Available Help at Discharge: Family;Available 24 hours/day Type of Home: House Home Access: Stairs to enter [employer] of Steps: 1   Home Layout: One level     Bathroom Shower/Tub: Teacher, early years/pre: Standard Bathroom Accessibility: Yes   Home Equipment: Cane - single point          Prior Functioning/Environment Prior Level of Function : Independent/Modified Independent;Driving             Mobility Comments: amb with no AD, no falls ADLs Comments: cooking, cleaning, grocery shopping, driving all with MOD I:pt performs ADL with MOD I-increased time due to pain        OT Problem List: Decreased activity tolerance;Impaired balance (sitting and/or standing);Decreased knowledge of use of DME or AE      OT Treatment/Interventions: Self-care/ADL training;DME and/or AE  instruction;Therapeutic activities;Balance training;Patient/family education;Therapeutic exercise    OT Goals(Current goals can be found in the care plan section) Acute Rehab OT Goals Patient Stated Goal: get stronger OT Goal Formulation: With patient Time For Goal Achievement: 08/13/22 Potential to Achieve Goals: Good ADL Goals Pt Will Perform Grooming: with modified independence;standing Pt Will Perform Lower Body Dressing: with modified independence;sit to/from stand Pt Will Transfer to Toilet: with modified independence;ambulating Pt Will Perform Toileting - Clothing Manipulation and hygiene: with modified independence;sitting/lateral leans  OT Frequency: Min 3X/week    Co-evaluation              AM-PAC OT "6 Clicks" Daily Activity     Outcome Measure Help from another person eating meals?: None Help from another person taking care of personal grooming?: None Help from another person toileting, which includes using toliet, bedpan, or urinal?: A Lot Help from another person bathing (including washing, rinsing, drying)?: A Lot Help from another person to put on and taking off regular upper body clothing?: A Little Help from another person to put on and taking off regular lower body clothing?: A Lot 6 Click Score: 17   End of Session Equipment Utilized During Treatment: Gait belt;Rolling walker (2 wheels) Nurse Communication: Mobility status  Activity Tolerance: Patient  tolerated treatment well Patient left: in chair;with call bell/phone within reach  OT Visit Diagnosis: Unsteadiness on feet (R26.81);Other abnormalities of gait and mobility (R26.89)                Time: 9021-1155 OT Time Calculation (min): 19 min Charges:  OT General Charges $OT Visit: 1 Visit OT Evaluation $OT Eval Moderate Complexity: 1 Mod  Shanon Payor, OTD OTR/L  07/31/22, 10:46 AM

## 2022-08-01 NOTE — Progress Notes (Signed)
Mobility Specialist Progress Note   08/01/22 1415  Mobility  Activity Transferred to/from [facility]  Level of Assistance Moderate assist, patient does 50-74%  Assistive Device Front wheel walker  Distance Ambulated (ft) 4 ft  Activity Response Tolerated well  $Mobility charge 1 Mobility   Pt received in chair requesting to use the BSC while expressing complaints of moderate back soreness. [employer] d/t pt's inability to fully stand, secondary to back pain. Pt able to pivot and turn to [facility] w/o fault. Left call bell by side and notified RN about position.   Holland Falling Mobility Specialist MS [facility] #:  [phone] Acute Rehab Office:  [phone]

## 2022-08-01 NOTE — Progress Notes (Signed)
Inpatient Rehab Admissions Coordinator:    Case sent to [facility] for [place]. They have requested updated MD note stating pt. Is stable for d/c and that the discharge plan is for CIR. I have reached out to attending service to request that this be included in today's documentation.   Clemens Catholic, [place], [place] Admissions Coordinator  [phone] ([place]) [phone] (office)

## 2022-08-01 NOTE — Progress Notes (Signed)
Inpatient Rehab Admissions Coordinator:    I spoke with Pt. Regarding CIR admit. Pt. Is interested, states wife is able to provide 24/7 support. I will follow for potential admit pending insurance auth.  Clemens Catholic, [place], [place] Admissions Coordinator  [phone] ([place]) [phone] (office)

## 2022-08-01 NOTE — Progress Notes (Signed)
Subjective: Patient reports  condition of back pain but overall still improved from preop.  No leg pain  Objective: Vital signs in last 24 hours: Temp:  [98 F (36.7 C)-99.4 F (37.4 C)] 99 F (37.2 C) (09/20 1635) Pulse Rate:  [78-92] 78 (09/20 1635) Resp:  [18] 18 (09/20 0018) BP: (114-132)/(39-57) 132/47 (09/20 1635) SpO2:  [94 %-95 %] 95 % (09/20 0738)  Intake/Output from previous day: 09/19 0701 - 09/20 0700 In: 330.3 [I.V.:5.3; IV Piggyback:200] Out: 1021 [Urine:900; Drains:120; Stool:1] Intake/Output this shift: Total I/O In: 120 [P.O.:120] Out: 125 [Drains:125]  Awake alert strength 5 out of 5 incision clean dry and intact  Lab Results: No results for input(s): "WBC", "HGB", "HCT", "PLT" in the last 72 hours. BMET No results for input(s): "NA", "K", "CL", "CO2", "GLUCOSE", "BUN", "CREATININE", "CALCIUM" in the last 72 hours.  Studies/Results: No results found.  Assessment/Plan: Postop day 2 anterior posterior spinal fusion making slow but steady progress continue work with physical therapy continue drain 1 more day patient should be stable enough to be transferred to inpatient rehab, CIR, when bed becomes available I do believe he will benefit greatly from inpatient rehab.  LOS: 2 days     Elaina Hoops 08/01/2022, 5:50 PM

## 2022-08-01 NOTE — PMR Pre-admission (Signed)
PMR Admission Coordinator Pre-Admission Assessment  Patient: Bryan [name] is an 76 y.o., male MRN: [id number] DOB: Sep 28, 1946 Height: 5' 5" (165.1 cm) Weight: 93 kg  Insurance Information HMO:     PPO:      PCP:      IPA:      80/20:      OTHER:  PRIMARY: [facility]       Policy#: [id number]      Subscriber: Pt   CM Name: Sherlynn Carbon      Phone#: [phone]     Fax#: [phone] Pre-Cert#: Received approval 08/02/21 from Sherlynn Carbon. Pt approved for up to 30 days as indicated      Employer:  Eff Date: 04/09/2018 - still active Deductible: $0 OOP Max: $0 As long as Referral from ER was received the following will be true:  CIR: 100% coverage, auth required SNF: 100% coverage, auth required Outpatient:  100% coverage, auth required Home Health:  100% coverage, auth required DME: 100% coverage, auth required Providers: in network   SECONDARY: [employer]     Policy#: [id number]     Phone#:   Financial Counselor:       Phone#:   The "Data Collection Information Summary" for patients in Inpatient Rehabilitation Facilities with attached "Privacy Act [place] Records" was provided and verbally reviewed with: Patient  Emergency Contact Information Contact Information     Name Relation Home Work Mobile   [place]   [phone]   [place], [place] [phone]   [place] [phone]         Current Medical History  Patient Admitting Diagnosis: Lumbar Myopathy History of Present Illness: Pt. Is a  76 year old gentleman with progressive worsening back pain bilateral hip and leg pain who previously had an  L2-S1 fusion and did well initially. However, reports he  has had progressive worsening low back pain rating down L5-S1 nerve root pattern.  Work-up  revealed progressive and persistent pseudoarthrosis at L5-S1 with loosening of both bilateral L5 and S1 screws.  Due to patient's progression of  clinical syndrome imaging findings failed conservative treatment Neurosurgery recommended anterior lateral interbody fusion at L5-S1 and then subsequent posterior exploration fusion removal of hardware and replacement of loose L5 and S1 screws with iliac fixation.  Pt. Underwent L5-S1 ALIF 07/30/22. PT and OT were consulted and recommend AIR to assist return to PLOF.    Patient's [facility] record [facility] has been reviewed by the rehabilitation admission coordinator and physician.  Past Medical History  Past Medical History:  Diagnosis Date   Anemia    Arthritis    hands, knees, cervical area. Back pain. Rheumatoid arthritis- weekly injections.   BPH (benign prostatic hypertrophy)    Cancer (HCC)    Diabetes (HCC)    Essential tremor    GAD (generalized anxiety disorder) 12/05/2021   GERD (gastroesophageal reflux disease)    reports for indigestion he uses mustard    Hearing deficit    wears hearing aids bilateral   HTN (hypertension)    Obesity    OSA (obstructive sleep apnea)    Refusal of blood transfusions as patient is Jehovah's Witness    Rheumatoid arthritis ([place])    Right bundle branch block    history of   Seizures ([place])    AS A CHILD. only esential tremors now.   Sleep apnea    cpap - settings at 9 per patient    Ulcer  Has the patient had major surgery during 100 days prior to admission? No  Family History   family history includes CAD in his father; Colon cancer in his maternal grandfather; Diabetes in his brother, mother, and paternal aunt; Hypertension in his brother, father, and mother; Kidney disease in his brother; Stroke in his maternal grandmother.  Current Medications  Current Facility-Administered Medications:    0.9 %  sodium chloride infusion, 250 mL, Intravenous, Continuous, Kary Kos, MD, Last Rate: 1 mL/hr at 07/31/22 0610, 250 mL at 07/31/22 0610   acetaminophen (TYLENOL) tablet 650 mg, 650 mg, Oral, Q4H PRN, 650 mg at  07/31/22 0948 **OR** acetaminophen (TYLENOL) suppository 650 mg, 650 mg, Rectal, Q4H PRN, Kary Kos, MD   acidophilus (RISAQUAD) capsule 1 capsule, 1 capsule, Oral, BID, Kary Kos, MD, 1 capsule at 08/02/22 2216   alum & mag hydroxide-simeth (MAALOX/MYLANTA) 200-200-20 MG/5ML suspension 30 mL, 30 mL, Oral, Q6H PRN, Kary Kos, MD   cyclobenzaprine (FLEXERIL) tablet 10 mg, 10 mg, Oral, TID PRN, Kary Kos, MD, 10 mg at 07/31/22 0948   diltiazem (CARDIZEM CD) 24 hr capsule 120 mg, 120 mg, Oral, Daily, Kary Kos, MD, 120 mg at 08/02/22 1058   furosemide (LASIX) tablet 20 mg, 20 mg, Oral, Daily, Kary Kos, MD, 20 mg at 08/02/22 1059   HYDROcodone-acetaminophen (NORCO/VICODIN) 5-325 MG per tablet 2 tablet, 2 tablet, Oral, Q4H PRN, Kary Kos, MD, 2 tablet at 08/01/22 2104   HYDROmorphone (DILAUDID) injection 0.5 mg, 0.5 mg, Intravenous, Q2H PRN, Kary Kos, MD   menthol-cetylpyridinium (CEPACOL) lozenge 3 mg, 1 lozenge, Oral, PRN **OR** phenol (CHLORASEPTIC) mouth spray 1 spray, 1 spray, Mouth/Throat, PRN, Kary Kos, MD   metoprolol succinate (TOPROL-XL) 24 hr tablet 100 mg, 100 mg, Oral, Daily, Kary Kos, MD, 100 mg at 08/02/22 1057   multivitamin with minerals tablet 1 tablet, 1 tablet, Oral, Daily, Kary Kos, MD, 1 tablet at 08/02/22 1059   nitroGLYCERIN (NITROSTAT) SL tablet 0.4 mg, 0.4 mg, Sublingual, Q5 min PRN, Kary Kos, MD   ondansetron [facility]) tablet 4 mg, 4 mg, Oral, Q6H PRN **OR** ondansetron (ZOFRAN) injection 4 mg, 4 mg, Intravenous, Q6H PRN, Kary Kos, MD   pantoprazole (PROTONIX) EC tablet 40 mg, 40 mg, Oral, QHS, Pierce, Dwayne A, RPH, 40 mg at 08/02/22 2215   primidone (MYSOLINE) tablet 150 mg, 150 mg, Oral, BID, Kary Kos, MD, 150 mg at 08/02/22 2215   sodium chloride flush (NS) 0.9 % injection 3 mL, 3 mL, Intravenous, Q12H, Kary Kos, MD, 3 mL at 08/02/22 2218   sodium chloride flush (NS) 0.9 % injection 3 mL, 3 mL, Intravenous, PRN, Kary Kos, MD   tamsulosin [facility])  capsule 0.4 mg, 0.4 mg, Oral, QHS, Kary Kos, MD, 0.4 mg at 08/02/22 2215  Patients Current Diet:  Diet Order             Diet regular Room service appropriate? No; Fluid consistency: Thin  Diet effective now                   Precautions / Restrictions Precautions Precautions: Back, Fall Precaution Booklet Issued: Yes (comment) Precaution Comments: no brace needed per orders Restrictions Weight Bearing Restrictions: No   Has the patient had 2 or more falls or a fall with injury in the past year? No  Prior Activity Level Pt. [place]itve in the community PTA    Prior Functional Level Self Care: Did the patient need help bathing, dressing, using the toilet or eating? Independent  Indoor Mobility:  Did the patient need assistance with walking from room to room (with or without device)? Independent  Stairs: Did the patient need assistance with internal or external stairs (with or without device)? Independent  Functional Cognition: Did the patient need help planning regular tasks such as shopping or remembering to take medications? Independent  Patient Information Are you of Hispanic, Latino/a,or Spanish origin?: A. No, not of Hispanic, Latino/a, or Spanish origin What is your race?: B. Black or African American Do you need or want an interpreter to communicate with a doctor or health care staff?: 0. No  Patient's Response To:  Health Literacy and Transportation Is the patient able to respond to health literacy and transportation needs?: Yes Health Literacy - How often do you need to have someone help you when you read instructions, pamphlets, or other written material from your doctor or pharmacy?: Never In the past 12 months, has lack of transportation kept you from medical appointments or from getting medications?: No In the past 12 months, has lack of transportation kept you from meetings, work, or from getting things needed for daily living?: No  Development worker, international aid /  [place] Devices/Equipment: Cabin crew, Eyeglasses Home Equipment: Kasandra Knudsen - single point  Prior Device Use: Indicate devices/aids used by the patient prior to current illness, exacerbation or injury? Walker  Current Functional Level Cognition  Overall Cognitive Status: Impaired/Different from baseline Orientation Level: Oriented to person, Oriented to place, Disoriented to time Safety/Judgement: Decreased awareness of safety, Decreased awareness of deficits General Comments: pleasant, follows one step directions well. difficulty implementing back precautions, requires safety cues for DME mgmt    Extremity Assessment (includes Sensation/Coordination)  Upper Extremity Assessment: Overall WFL for tasks assessed  Lower Extremity Assessment: Defer to PT evaluation RLE Deficits / Details: Grossly 5/5 except hip flexion 4/5 LLE Deficits / Details: Grossly 5/5 exception hip flexion 4/5    ADLs  Overall ADL's : Needs assistance/impaired Grooming: Wash/dry hands, Wash/dry face, Oral care, Sitting, Set up Lower Body Bathing: Maximal assistance Lower Body Bathing Details (indicate cue type and reason): anticipated Lower Body Dressing: Maximal assistance Toilet Transfer: Minimal assistance, Ambulation, Rolling walker (2 wheels) Toilet Transfer Details (indicate cue type and reason): BSC over toilet. Min A to manuever RW in/out of bathroom and assist to prevent RW from getting too far in front of pt Toileting- Clothing Manipulation and Hygiene: Moderate assistance, Sit to/from stand Toileting - Clothing Manipulation Details (indicate cue type and reason): assist for clothing mgmt and hygiene Functional mobility during ADLs: Minimal assistance, Rolling walker (2 wheels), Cueing for sequencing, Cueing for safety General ADL Comments: Limited by back pain, R LE weakness and new use of DME requiring hands on assist and consistent cues    Mobility  Overal bed mobility: Needs  Assistance Bed Mobility: Rolling, Sidelying to Sit, Sit to Sidelying Rolling: Min assist Sidelying to sit: Mod assist Sit to sidelying: Mod assist General bed mobility comments: Mod A to lift trunk, increased effort to scoot EOB. Mod A to get BLE back into bed with consistent cues to keep spine straight    Transfers  Overall transfer level: Needs assistance Equipment used: Rolling walker (2 wheels) Transfers: Sit to/from Stand Sit to Stand: Min assist Bed to/from chair/wheelchair/BSC transfer type:: Step pivot Stand pivot transfers: Mod assist Step pivot transfers: Min assist General transfer comment: Min A to stand from elevated bed, noted to pull on RW. min guard to stand from toilet with BSC placed on top  Ambulation / Gait / Stairs / Wheelchair Mobility  Ambulation/Gait Ambulation/Gait assistance: Herbalist (Feet): 12 Feet Assistive device: Rolling walker (2 wheels) Gait Pattern/deviations: Step-through pattern, Decreased stride length, Wide base of support, Shuffle General Gait Details: decreased foot clearance bilaterally. No overt LOB. MinA for balance and RW management at times. Chair follow beneficial for longer distances Gait velocity: decreased Gait velocity interpretation: <1.8 ft/sec, indicate of risk for recurrent falls    Posture / Balance Balance Overall balance assessment: Needs assistance Sitting-balance support: Feet supported Sitting balance-Leahy Scale: Fair Standing balance support: Bilateral upper extremity supported, During functional activity, Reliant on assistive device for balance Standing balance-Leahy Scale: Poor Standing balance comment: reliant on RW    Special needs/care consideration Skin Surgical incision: abdomen, back    Previous Home Environment (from acute therapy documentation) Living Arrangements: Spouse/significant other  Lives With: Spouse Available Help at Discharge: Family, Available 24 hours/day Type of Home:  House Home Layout: One level Home Access: Stairs to enter [employer] of Steps: 1 Bathroom Shower/Tub: Optometrist: Yes Home Care Services: No  Discharge Living Setting Plans for Discharge Living Setting: Patient's home Type of Home at Discharge: House Discharge Home Layout: One level Discharge Home Access: Stairs to enter Entrance Stairs-Rails: None Entrance Stairs-Number of Steps: 1 Discharge Bathroom Shower/Tub: Tub/shower unit Discharge Bathroom Toilet: Standard Discharge Bathroom Accessibility: No Does the patient have any problems obtaining your medications?: No  Social/Family/Support Systems Patient Roles: Spouse Contact Information: [phone] Anticipated Caregiver: 24/7 Anticipated Caregiver's Contact Information: [place] Ability/Limitations of Caregiver: MinA Caregiver Availability: 24/7 Discharge Plan Discussed with Primary Caregiver: Yes Is Caregiver In Agreement with Plan?: Yes Does Caregiver/Family have Issues with Lodging/Transportation while Pt is in Rehab?: No  Goals Patient/Family Goal for Rehab: PT/OT Supervision Expected length of stay: 7-10 days Pt/Family Agrees to Admission and willing to participate: No Program Orientation Provided & Reviewed with Pt/Caregiver Including Roles  & Responsibilities: No  Decrease burden of Care through IP rehab admission: none   Possible need for SNF placement upon discharge: not anticipated   Patient Condition: I have reviewed medical records from [facility] , spoken with CM, and patient and family member. I met with patient at the bedside for inpatient rehabilitation assessment.  Patient will benefit from ongoing PT and OT, can actively participate in 3 hours of therapy a day 5 days of the week, and can make measurable gains during the admission.  Patient will also benefit from the coordinated team approach during an Inpatient Acute  Rehabilitation admission.  The patient will receive intensive therapy as well as Rehabilitation physician, nursing, social worker, and care management interventions.  Due to safety, skin/wound care, disease management, medication administration, pain management, and patient education the patient requires 24 hour a day rehabilitation nursing.  The patient is currently Min A with mobility and basic ADLs.  Discharge setting and therapy post discharge at home with home health is anticipated.  Patient has agreed to participate in the Acute Inpatient Rehabilitation Program and will admit today.  Preadmission Screen Completed By:  Genella Mech, with updates by Gayland Curry 08/03/2022 10:47 AM ______________________________________________________________________   Discussed status with Dr. Naaman Plummer on 08/03/22  at 10:47 AM and received approval for admission today.  Admission Coordinator:  Genella Mech, CCC-SLP,with updates by Gayland Curry, MS, CCC-SLP time 10:47 AM/Date 08/03/22    Assessment/Plan: Diagnosis: lumbar myelopathy Does the need for close, 24 hr/day Medical supervision in concert  with the patient's rehab needs make it unreasonable for this patient to be served in a less intensive setting? Yes Co-Morbidities requiring supervision/potential complications: post-op pain, wound care issues, dm, htn, obesity, RA Due to bladder management, bowel management, safety, skin/wound care, disease management, medication administration, pain management, and patient education, does the patient require 24 hr/day rehab nursing? Yes Does the patient require coordinated care of a physician, rehab nurse, PT, OT, and  to address physical and functional deficits in the context of the above medical diagnosis(es)? Yes Addressing deficits in the following areas: balance, endurance, locomotion, strength, transferring, bowel/bladder control, bathing, dressing, feeding, grooming, toileting, and psychosocial  support Can the patient actively participate in an intensive therapy program of at least 3 hrs of therapy 5 days a week? Yes The potential for patient to make measurable gains while on inpatient rehab is excellent Anticipated functional outcomes upon discharge from inpatient rehab: supervision PT, supervision OT, n/a SLP Estimated rehab length of stay to reach the above functional goals is: 7-10 days Anticipated discharge destination: Home 10. Overall Rehab/Functional Prognosis: excellent   MD Signature: Meredith Staggers, MD, [place] Director Rehabilitation Services 08/03/2022

## 2022-08-01 NOTE — Progress Notes (Signed)
Physical Therapy Treatment Patient Details Name: Bryan [name] MRN: [id number] DOB: 1946-05-31 Today's Date: 08/01/2022   History of Present Illness Pt is a 76 year old male s/p L5-S1 ALIF 07/30/22; PMH significant for stage IIb marginal zone lymphoma, rheumatoid arthritis, anemia, CAD with mild ischemia (04/2020), paroxysmal atrial flutter, h/o post-op PE-currently on Pradaxa, pulmonary hypertension.    PT Comments    Pt making incremental progress towards his physical therapy goals; remains motivated to participate. Pt with bowel incontinence upon standing; pulled BSC up behind pt. Pt ambulating ~5 ft with a walker and close chair follow. Demonstrates generalized weakness (RLE weaker than LLE), impaired balance, decreased activity tolerance and pain. Would benefit from AIR to address deficits, maximize functional mobility and decrease caregiver burden.     Recommendations for follow up therapy are one component of a multi-disciplinary discharge planning process, led by the attending physician.  Recommendations may be updated based on patient status, additional functional criteria and insurance authorization.  Follow Up Recommendations  Acute inpatient rehab (3hours/day)     Assistance Recommended at Discharge Frequent or constant Supervision/Assistance  Patient can return home with the following A little help with bathing/dressing/bathroom;A lot of help with walking and/or transfers;Assistance with cooking/housework;Assist for transportation;Help with stairs or ramp for entrance   Equipment Recommendations  Rolling walker (2 wheels);BSC/3in1    Recommendations for Other Services Rehab consult     Precautions / Restrictions Precautions Precautions: Back;Fall Precaution Booklet Issued: Yes (comment) Restrictions Weight Bearing Restrictions: No     Mobility  Bed Mobility Overal bed mobility: Needs Assistance Bed Mobility: Rolling, Sidelying to Sit Rolling: Min assist Sidelying  to sit: Min assist       General bed mobility comments: Cues for log roll technique. Pt requiring assist to roll into sidelying towards left, assist for RLE off edge of bed    Transfers Overall transfer level: Needs assistance Equipment used: Rolling walker (2 wheels) Transfers: Sit to/from Stand Sit to Stand: Min assist           General transfer comment: MinA to rise from edge of bed, pt pulling up from RW despite cues. Increased time to rise    Ambulation/Gait Ambulation/Gait assistance: Min assist, +2 safety/equipment Gait Distance (Feet): 5 Feet Assistive device: Rolling walker (2 wheels) Gait Pattern/deviations: Step-through pattern, Decreased stride length, Wide base of support, Shuffle Gait velocity: decreased Gait velocity interpretation: <1.8 ft/sec, indicate of risk for recurrent falls   General Gait Details: Pt with decreased bilateral foot clearance, wide BOS, and modest instability. Cues for walker proximity and stepping initiation   Stairs             Wheelchair Mobility    Modified Rankin (Stroke Patients Only)       Balance Overall balance assessment: Needs assistance Sitting-balance support: Feet supported Sitting balance-Leahy Scale: Fair     Standing balance support: Bilateral upper extremity supported, During functional activity, Reliant on assistive device for balance Standing balance-Leahy Scale: Poor Standing balance comment: reliant on RW                            Cognition Arousal/Alertness: Awake/alert Behavior During Therapy: WFL for tasks assessed/performed Overall Cognitive Status: Within Functional Limits for tasks assessed  Exercises General Exercises - Lower Extremity Ankle Circles/Pumps: Both, 10 reps, Seated Long Arc Quad: Both, 10 reps, Seated Hip Flexion/Marching: Both, 10 reps, Seated    General Comments        Pertinent Vitals/Pain Pain  Assessment Pain Assessment: Faces Faces Pain Scale: Hurts even more Pain Location: lower back Pain Descriptors / Indicators: Discomfort, Grimacing Pain Intervention(s): Monitored during session, Limited activity within patient's tolerance    Home Living                          Prior Function            PT Goals (current goals can now be found in the care plan section) Acute Rehab PT Goals Patient Stated Goal: to walk Potential to Achieve Goals: Good Progress towards PT goals: Progressing toward goals    Frequency    Min 5X/week      PT Plan Current plan remains appropriate    Co-evaluation              AM-PAC PT "6 Clicks" Mobility   Outcome Measure  Help needed turning from your back to your side while in a flat bed without using bedrails?: A Little Help needed moving from lying on your back to sitting on the side of a flat bed without using bedrails?: A Little Help needed moving to and from a bed to a chair (including a wheelchair)?: A Little Help needed standing up from a chair using your arms (e.g., wheelchair or bedside chair)?: A Little Help needed to walk in hospital room?: Total Help needed climbing 3-5 steps with a railing? : Total 6 Click Score: 14    End of Session Equipment Utilized During Treatment: Gait belt Activity Tolerance: Patient tolerated treatment well Patient left: in chair;with call bell/phone within reach;with family/visitor present Nurse Communication: Mobility status PT Visit Diagnosis: Unsteadiness on feet (R26.81);Difficulty in walking, not elsewhere classified (R26.2);Pain     Time: 1050-1110 PT Time Calculation (min) (ACUTE ONLY): 20 min  Charges:  $Therapeutic Activity: 8-22 mins                     Bryan [name], PT, DPT Acute Rehabilitation Services Office [phone]    Bryan [name] 08/01/2022, 12:21 PM

## 2022-08-01 NOTE — Progress Notes (Signed)
Mobility Specialist Progress Note   08/01/22 1500  Mobility  Activity Transferred to/from [facility];Transferred from chair to bed  Level of Assistance Moderate assist, patient does 50-74%  Assistive Device Front wheel walker  Distance Ambulated (ft) 10 ft  Activity Response Tolerated well  $Mobility charge 1 Mobility   Received pt on [facility] stating to be finish and needing assistance. ModA to stand and assist in pericare d/t increased back pain. Pt agreeable to ambulate in room afterwards. ambulated w/o fault but fatigued and stating increase in pain post mobility. Returned back to bed requiring assistance w/ LE's. Call bell in reach and RN present in room.  Holland Falling Mobility Specialist MS [facility] #:  [phone] Acute Rehab Office:  [phone]

## 2022-08-02 LAB — BASIC METABOLIC PANEL
Anion gap: 11 (ref 5–15)
BUN: 27 mg/dL — ABNORMAL HIGH (ref 8–23)
CO2: 22 mmol/L (ref 22–32)
Calcium: 8.2 mg/dL — ABNORMAL LOW (ref 8.9–10.3)
Chloride: 105 mmol/L (ref 98–111)
Creatinine, Ser: 1.67 mg/dL — ABNORMAL HIGH (ref 0.61–1.24)
GFR, Estimated: 42 mL/min — ABNORMAL LOW (ref >60)
Glucose, Bld: 96 mg/dL (ref 70–99)
Potassium: 3.3 mmol/L — ABNORMAL LOW (ref 3.5–5.1)
Sodium: 138 mmol/L (ref 135–145)

## 2022-08-02 MED ORDER — LACTATED RINGERS IV BOLUS
1000.0000 mL | Freq: Once | INTRAVENOUS | Status: AC
  Administered 2022-08-02: 1000 mL via INTRAVENOUS

## 2022-08-02 MED ORDER — POTASSIUM CHLORIDE CRYS ER 20 MEQ PO TBCR
40.0000 meq | EXTENDED_RELEASE_TABLET | Freq: Once | ORAL | Status: AC
  Administered 2022-08-02: 40 meq via ORAL
  Filled 2022-08-02: qty 2

## 2022-08-02 MED FILL — Heparin Sodium (Porcine) Inj 1000 Unit/ML: INTRAMUSCULAR | Qty: 30 | Status: AC

## 2022-08-02 MED FILL — Sodium Chloride IV Soln 0.9%: INTRAVENOUS | Qty: 1000 | Status: AC

## 2022-08-02 NOTE — Progress Notes (Signed)
2nd assessed IV RN. Assessed patient for PIV with ultrasound ,no suitable vein found for PIV and midline,RN aware and will notify MD.

## 2022-08-02 NOTE — Social Work (Signed)
CSW noted pt screened red on Social Determinants of Health screening, indicating food insecurity. Screening noted pt stated in the last 6 months he somewhat had trouble getting food. CSW met with pt at bedside, he is pleasant and stated he had no issues getting food. Pt states he has no concerns at home. TOC is available for any further needs.

## 2022-08-02 NOTE — Progress Notes (Signed)
Spoke with MD Kary Kos. Okay to encourage PO intake. If pt able to tolerate "2-3 coke cans" of intake, can stay w/o IV access.

## 2022-08-02 NOTE — Progress Notes (Signed)
Occupational Therapy Treatment Patient Details Name: Bryan [name] MRN: [id number] DOB: [birth year] Today's Date: 08/02/2022   History of present illness Pt is a 76 year old male s/p L5-S1 ALIF 07/30/22; PMH significant for stage IIb marginal zone lymphoma, rheumatoid arthritis, anemia, CAD with mild ischemia (04/2020), paroxysmal atrial flutter, h/o post-op PE-currently on Pradaxa, pulmonary hypertension.   OT comments  Pt progressing well towards OT goals. On entry, pt requesting bathroom assistance. Pt able to progress bathroom mobility using RW with Min A and consistent cues for DME mgmt. Pt continues to require increased assistance for LB ADLs including toileting hygiene today at Mod A d/t pain, difficulty recalling back precautions and balance deficits. Continue to feel pt will progress well with AIR level therapies at DC.    Recommendations for follow up therapy are one component of a multi-disciplinary discharge planning process, led by the attending physician.  Recommendations may be updated based on patient status, additional functional criteria and insurance authorization.    Follow Up Recommendations  Acute inpatient rehab (3hours/day)    Assistance Recommended at Discharge Frequent or constant Supervision/Assistance  Patient can return home with the following  A little help with walking and/or transfers;A lot of help with bathing/dressing/bathroom;Assistance with cooking/housework;Direct supervision/assist for medications management;Assist for transportation;Help with stairs or ramp for entrance   Equipment Recommendations  BSC/3in1;Tub/shower seat    Recommendations for Other Services      Precautions / Restrictions Precautions Precautions: Back;Fall Precaution Booklet Issued: Yes (comment) Precaution Comments: no brace needed per orders Required Braces or Orthoses:  (no brace) Restrictions Weight Bearing Restrictions: No       Mobility Bed Mobility Overal bed  mobility: Needs Assistance Bed Mobility: Rolling, Sidelying to Sit, Sit to Sidelying Rolling: Min assist Sidelying to sit: Mod assist     Sit to sidelying: Mod assist General bed mobility comments: Mod A to lift trunk, increased effort to scoot EOB. Mod A to get BLE back into bed with consistent cues to keep spine straight    Transfers Overall transfer level: Needs assistance Equipment used: Rolling walker (2 wheels) Transfers: Sit to/from Stand Sit to Stand: Min assist           General transfer comment: Min A to stand from elevated bed, noted to pull on RW. min guard to stand from toilet with BSC placed on top     Balance Overall balance assessment: Needs assistance Sitting-balance support: Feet supported Sitting balance-Leahy Scale: Fair     Standing balance support: Bilateral upper extremity supported, During functional activity, Reliant on assistive device for balance Standing balance-Leahy Scale: Poor                             ADL either performed or assessed with clinical judgement   ADL Overall ADL's : Needs assistance/impaired                         Toilet Transfer: Minimal assistance;Ambulation;Rolling walker (2 wheels) Toilet Transfer Details (indicate cue type and reason): BSC over toilet. Min A to manuever RW in/out of bathroom and assist to prevent RW from getting too far in front of pt Toileting- Clothing Manipulation and Hygiene: Moderate assistance;Sit to/from stand Toileting - Clothing Manipulation Details (indicate cue type and reason): assist for clothing mgmt and hygiene     Functional mobility during ADLs: Minimal assistance;Rolling walker (2 wheels);Cueing for sequencing;Cueing for safety General ADL Comments: Limited by  back pain, R LE weakness and new use of DME requiring hands on assist and consistent cues    Extremity/Trunk Assessment Upper Extremity Assessment Upper Extremity Assessment: Overall WFL for tasks  assessed   Lower Extremity Assessment Lower Extremity Assessment: Defer to PT evaluation        Vision   Vision Assessment?: No apparent visual deficits   Perception     Praxis      Cognition Arousal/Alertness: Awake/alert Behavior During Therapy: WFL for tasks assessed/performed Overall Cognitive Status: Impaired/Different from baseline Area of Impairment: Safety/judgement, Awareness, Problem solving                         Safety/Judgement: Decreased awareness of safety, Decreased awareness of deficits Awareness: Emergent Problem Solving: Difficulty sequencing, Requires verbal cues, Requires tactile cues General Comments: pleasant, follows one step directions well. difficulty implementing back precautions, requires safety cues for DME mgmt        Exercises      Shoulder Instructions       General Comments Daughter entering during session and IV team at end of session.    Pertinent Vitals/ Pain       Pain Assessment Pain Assessment: 0-10 Pain Score: 8  Pain Location: lower back Pain Descriptors / Indicators: Discomfort, Grimacing Pain Intervention(s): Monitored during session, Limited activity within patient's tolerance  Home Living                                          Prior Functioning/Environment              Frequency  Min 2X/week        Progress Toward Goals  OT Goals(current goals can now be found in the care plan section)  Progress towards OT goals: Progressing toward goals  Acute Rehab OT Goals Patient Stated Goal: decrease back pain, get warm OT Goal Formulation: With patient Time For Goal Achievement: 08/13/22 Potential to Achieve Goals: Good ADL Goals Pt Will Perform Grooming: with modified independence;standing Pt Will Perform Lower Body Dressing: with modified independence;sit to/from stand Pt Will Transfer to Toilet: with modified independence;ambulating Pt Will Perform Toileting - Clothing  Manipulation and hygiene: with modified independence;sitting/lateral leans  Plan Discharge plan remains appropriate;Frequency needs to be updated    Co-evaluation                 AM-PAC OT "6 Clicks" Daily Activity     Outcome Measure   Help from another person eating meals?: None Help from another person taking care of personal grooming?: None Help from another person toileting, which includes using toliet, bedpan, or urinal?: A Lot Help from another person bathing (including washing, rinsing, drying)?: A Lot Help from another person to put on and taking off regular upper body clothing?: A Little Help from another person to put on and taking off regular lower body clothing?: A Lot 6 Click Score: 17    End of Session Equipment Utilized During Treatment: Gait belt;Rolling walker (2 wheels)  OT Visit Diagnosis: Unsteadiness on feet (R26.81);Other abnormalities of gait and mobility (R26.89)   Activity Tolerance Patient tolerated treatment well   Patient Left in bed;with call bell/phone within reach;with family/visitor present;Other (comment) (IV team at bedside)   Nurse Communication Mobility status        Time: 0960-4540 OT Time Calculation (min): 18 min  Charges: OT  General Charges $OT Visit: 1 Visit OT Treatments $Self Care/Home Management : 8-22 mins  Malachy Chamber, OTR/L Acute Rehab Services Office: [phone]   Layla Maw 08/02/2022, 1:35 PM

## 2022-08-02 NOTE — Progress Notes (Signed)
MD notified pt requiring PICC placement for IV access

## 2022-08-02 NOTE — Progress Notes (Signed)
Subjective: Patient reports doing well condition of back pain but otherwise improved leg pain  Objective: Vital signs in last 24 hours: Temp:  [99 F (37.2 C)-99.8 F (37.7 C)] 99 F (37.2 C) (09/21 0507) Pulse Rate:  [70-95] 95 (09/21 0507) Resp:  [16] 16 (09/21 0507) BP: (105-137)/(47-64) 105/56 (09/21 0507) SpO2:  [96 %-97 %] 96 % (09/21 0507)  Intake/Output from previous day: 09/20 0701 - 09/21 0700 In: 120 [P.O.:120] Out: 425 [Urine:200; Drains:225] Intake/Output this shift: No intake/output data recorded.  Strength 5 out of 5 incision clean dry and intact  Lab Results: No results for input(s): "WBC", "HGB", "HCT", "PLT" in the last 72 hours. BMET Recent Labs    08/02/22 0125  NA 138  K 3.3*  CL 105  CO2 22  GLUCOSE 96  BUN 27*  CREATININE 1.67*  CALCIUM 8.2*    Studies/Results: No results found.  Assessment/Plan: Postop day 3 continue to mobilize with physical Occupational Therapy awaiting CIR bed availability stable for discharge we will DC Hem[facility]  LOS: 3 days     Elaina Hoops 08/02/2022, 8:03 AM

## 2022-08-02 NOTE — Progress Notes (Signed)
Mobility Specialist Progress Note   08/02/22 1544  Mobility  Activity Ambulated with assistance in room  Level of Assistance Moderate assist, patient does 50-74%  Assistive Device Front wheel walker  Distance Ambulated (ft) 48 ft  Activity Response Tolerated well  $Mobility charge 1 Mobility   Pt received in bed stating to be feeling better but still sore from drain removal. Pt requiring ModA in order to get LE's to EOB d/t general weakness. Able to ambulate w/o fault but pt requiring to use BSC d/t urgency. Successful void. Returned back to bed w/o fault, call bell in reach and bed alarm on.      Holland Falling Mobility Specialist [place] #:  [phone] Acute Rehab Office:  [phone]

## 2022-08-02 NOTE — Progress Notes (Signed)
Physical Therapy Treatment Patient Details Name: Bryan [name] MRN: [id number] DOB: 1946-10-01 Today's Date: 08/02/2022   History of Present Illness Pt is a 76 year old male s/p L5-S1 ALIF 07/30/22; PMH significant for stage IIb marginal zone lymphoma, rheumatoid arthritis, anemia, CAD with mild ischemia (04/2020), paroxysmal atrial flutter, h/o post-op PE-currently on Pradaxa, pulmonary hypertension.    PT Comments    Patient progressing towards physical therapy goals. Patient able to transfer to [facility] prior mobilizing with minA to attempt urination or BM as recently he has had incontinence issues. Able to ambulate 84' with minA and RW but demonstrates decreased B foot clearance and at times difficulty advancing R foot. Continue to recommend acute inpatient rehab (AIR) for post-acute therapy needs.     Recommendations for follow up therapy are one component of a multi-disciplinary discharge planning process, led by the attending physician.  Recommendations may be updated based on patient status, additional functional criteria and insurance authorization.  Follow Up Recommendations  Acute inpatient rehab (3hours/day)     Assistance Recommended at Discharge Frequent or constant Supervision/Assistance  Patient can return home with the following A little help with bathing/dressing/bathroom;A lot of help with walking and/or transfers;Assistance with cooking/housework;Assist for transportation;Help with stairs or ramp for entrance   Equipment Recommendations  Rolling Cristina Mattern (2 wheels);BSC/3in1    Recommendations for Other Services Rehab consult     Precautions / Restrictions Precautions Precautions: Back;Fall Precaution Booklet Issued: Yes (comment) Required Braces or Orthoses:  (no brace) Restrictions Weight Bearing Restrictions: No     Mobility  Bed Mobility Overal bed mobility: Needs Assistance Bed Mobility: Rolling, Sidelying to Sit Rolling: Min assist Sidelying to sit: Min  assist       General bed mobility comments: cues to recall log roll technique. Assist to roll towards R side and trunk elevation    Transfers Overall transfer level: Needs assistance Equipment used: Rolling Zaliyah Meikle (2 wheels) Transfers: Sit to/from Stand Sit to Stand: Min assist   Step pivot transfers: Min assist       General transfer comment: assist to rise and steady from EOB. Cues for hand placement. Able take pivotal steps to [facility] prior to mobilizing    Ambulation/Gait Ambulation/Gait assistance: Min assist Gait Distance (Feet): 12 Feet Assistive device: Rolling Emme Rosenau (2 wheels) Gait Pattern/deviations: Step-through pattern, Decreased stride length, Wide base of support, Shuffle Gait velocity: decreased     General Gait Details: decreased foot clearance bilaterally. No overt LOB. MinA for balance and RW management at times. Chair follow beneficial for longer distances   Stairs             Wheelchair Mobility    Modified Rankin (Stroke Patients Only)       Balance Overall balance assessment: Needs assistance Sitting-balance support: Feet supported Sitting balance-Leahy Scale: Fair     Standing balance support: Bilateral upper extremity supported, During functional activity, Reliant on assistive device for balance Standing balance-Leahy Scale: Poor Standing balance comment: reliant on RW                            Cognition Arousal/Alertness: Awake/alert Behavior During Therapy: WFL for tasks assessed/performed Overall Cognitive Status: Within Functional Limits for tasks assessed                                          Exercises  General Comments        Pertinent Vitals/Pain Pain Assessment Pain Assessment: Faces Faces Pain Scale: Hurts little more Pain Location: lower back Pain Descriptors / Indicators: Discomfort, Grimacing Pain Intervention(s): Monitored during session    Home Living                           Prior Function            PT Goals (current goals can now be found in the care plan section) Acute Rehab PT Goals Patient Stated Goal: to walk PT Goal Formulation: With patient Time For Goal Achievement: 08/14/22 Potential to Achieve Goals: Good Progress towards PT goals: Progressing toward goals    Frequency    Min 5X/week      PT Plan Current plan remains appropriate    Co-evaluation              AM-PAC PT "6 Clicks" Mobility   Outcome Measure  Help needed turning from your back to your side while in a flat bed without using bedrails?: A Little Help needed moving from lying on your back to sitting on the side of a flat bed without using bedrails?: A Little Help needed moving to and from a bed to a chair (including a wheelchair)?: A Little Help needed standing up from a chair using your arms (e.g., wheelchair or bedside chair)?: A Little Help needed to walk in hospital room?: Total Help needed climbing 3-5 steps with a railing? : Total 6 Click Score: 14    End of Session Equipment Utilized During Treatment: Gait belt Activity Tolerance: Patient tolerated treatment well Patient left: in chair;with call bell/phone within reach;with chair alarm set Nurse Communication: Mobility status PT Visit Diagnosis: Unsteadiness on feet (R26.81);Difficulty in walking, not elsewhere classified (R26.2);Pain     Time: 1005-1029 PT Time Calculation (min) (ACUTE ONLY): 24 min  Charges:  $Gait Training: 8-22 mins $Therapeutic Activity: 8-22 mins                     Eleri Ruben A. Gilford Rile PT, DPT Acute Rehabilitation Services Office [phone]    Linna Hoff 08/02/2022, 12:31 PM

## 2022-08-02 NOTE — Progress Notes (Signed)
IP rehab admissions - updated PT/OT and MD progress notes have been sent to [facility] insurance per their request.  Awaiting decision regarding potential inpatient rehab admission.  I will update all once I hear back from insurance carrier.  Call for questions.  [phone]

## 2022-08-03 ENCOUNTER — Encounter (HOSPITAL_COMMUNITY): Payer: Self-pay | Admitting: Neurosurgery

## 2022-08-03 ENCOUNTER — Other Ambulatory Visit: Payer: Self-pay

## 2022-08-03 ENCOUNTER — Inpatient Hospital Stay (HOSPITAL_COMMUNITY)
Admission: RE | Admit: 2022-08-03 | Discharge: 2022-08-10 | DRG: 092 | Disposition: A | Discharge status: Home / Self Care | Payer: No Typology Code available for payment source | Source: Intra-hospital | Attending: Physical Medicine and Rehabilitation | Admitting: Physical Medicine and Rehabilitation

## 2022-08-03 DIAGNOSIS — H9190 Unspecified hearing loss, unspecified ear: Secondary | ICD-10-CM | POA: Diagnosis present

## 2022-08-03 DIAGNOSIS — Z881 Allergy status to other antibiotic agents status: Secondary | ICD-10-CM

## 2022-08-03 DIAGNOSIS — Z531 Procedure and treatment not carried out because of patient's decision for reasons of belief and group pressure: Secondary | ICD-10-CM | POA: Diagnosis present

## 2022-08-03 DIAGNOSIS — F411 Generalized anxiety disorder: Secondary | ICD-10-CM | POA: Diagnosis present

## 2022-08-03 DIAGNOSIS — N1831 Chronic kidney disease, stage 3a: Secondary | ICD-10-CM | POA: Diagnosis present

## 2022-08-03 DIAGNOSIS — Z9049 Acquired absence of other specified parts of digestive tract: Secondary | ICD-10-CM

## 2022-08-03 DIAGNOSIS — E669 Obesity, unspecified: Secondary | ICD-10-CM | POA: Diagnosis present

## 2022-08-03 DIAGNOSIS — D62 Acute posthemorrhagic anemia: Secondary | ICD-10-CM | POA: Diagnosis present

## 2022-08-03 DIAGNOSIS — I10 Essential (primary) hypertension: Secondary | ICD-10-CM | POA: Diagnosis present

## 2022-08-03 DIAGNOSIS — Z8249 Family history of ischemic heart disease and other diseases of the circulatory system: Secondary | ICD-10-CM | POA: Diagnosis not present

## 2022-08-03 DIAGNOSIS — Z974 Presence of external hearing-aid: Secondary | ICD-10-CM | POA: Diagnosis not present

## 2022-08-03 DIAGNOSIS — E876 Hypokalemia: Secondary | ICD-10-CM | POA: Diagnosis present

## 2022-08-03 DIAGNOSIS — I129 Hypertensive chronic kidney disease with stage 1 through stage 4 chronic kidney disease, or unspecified chronic kidney disease: Secondary | ICD-10-CM | POA: Diagnosis present

## 2022-08-03 DIAGNOSIS — I480 Paroxysmal atrial fibrillation: Secondary | ICD-10-CM | POA: Diagnosis present

## 2022-08-03 DIAGNOSIS — K5900 Constipation, unspecified: Secondary | ICD-10-CM | POA: Diagnosis present

## 2022-08-03 DIAGNOSIS — Z833 Family history of diabetes mellitus: Secondary | ICD-10-CM

## 2022-08-03 DIAGNOSIS — G4733 Obstructive sleep apnea (adult) (pediatric): Secondary | ICD-10-CM | POA: Diagnosis present

## 2022-08-03 DIAGNOSIS — C8300 Small cell B-cell lymphoma, unspecified site: Secondary | ICD-10-CM | POA: Diagnosis present

## 2022-08-03 DIAGNOSIS — Z23 Encounter for immunization: Secondary | ICD-10-CM

## 2022-08-03 DIAGNOSIS — G250 Essential tremor: Secondary | ICD-10-CM | POA: Diagnosis present

## 2022-08-03 DIAGNOSIS — N400 Enlarged prostate without lower urinary tract symptoms: Secondary | ICD-10-CM | POA: Diagnosis present

## 2022-08-03 DIAGNOSIS — N189 Chronic kidney disease, unspecified: Secondary | ICD-10-CM

## 2022-08-03 DIAGNOSIS — M069 Rheumatoid arthritis, unspecified: Secondary | ICD-10-CM | POA: Diagnosis present

## 2022-08-03 DIAGNOSIS — Z981 Arthrodesis status: Secondary | ICD-10-CM

## 2022-08-03 DIAGNOSIS — Z7902 Long term (current) use of antithrombotics/antiplatelets: Secondary | ICD-10-CM

## 2022-08-03 DIAGNOSIS — R7303 Prediabetes: Secondary | ICD-10-CM | POA: Diagnosis present

## 2022-08-03 DIAGNOSIS — Z86718 Personal history of other venous thrombosis and embolism: Secondary | ICD-10-CM

## 2022-08-03 DIAGNOSIS — R2689 Other abnormalities of gait and mobility: Secondary | ICD-10-CM | POA: Diagnosis present

## 2022-08-03 DIAGNOSIS — M5106 Intervertebral disc disorders with myelopathy, lumbar region: Secondary | ICD-10-CM | POA: Diagnosis present

## 2022-08-03 DIAGNOSIS — K219 Gastro-esophageal reflux disease without esophagitis: Secondary | ICD-10-CM | POA: Diagnosis present

## 2022-08-03 DIAGNOSIS — Z87891 Personal history of nicotine dependence: Secondary | ICD-10-CM

## 2022-08-03 DIAGNOSIS — Z888 Allergy status to other drugs, medicaments and biological substances status: Secondary | ICD-10-CM

## 2022-08-03 DIAGNOSIS — N179 Acute kidney failure, unspecified: Secondary | ICD-10-CM | POA: Diagnosis present

## 2022-08-03 DIAGNOSIS — Z86711 Personal history of pulmonary embolism: Secondary | ICD-10-CM | POA: Diagnosis not present

## 2022-08-03 DIAGNOSIS — Z6836 Body mass index (BMI) 36.0-36.9, adult: Secondary | ICD-10-CM

## 2022-08-03 DIAGNOSIS — I4891 Unspecified atrial fibrillation: Secondary | ICD-10-CM | POA: Diagnosis not present

## 2022-08-03 DIAGNOSIS — Z79899 Other long term (current) drug therapy: Secondary | ICD-10-CM

## 2022-08-03 MED ORDER — INFLUENZA VAC A&B SA ADJ QUAD 0.5 ML IM PRSY
0.5000 mL | PREFILLED_SYRINGE | INTRAMUSCULAR | Status: AC
  Administered 2022-08-04: 0.5 mL via INTRAMUSCULAR
  Filled 2022-08-03: qty 0.5

## 2022-08-03 MED ORDER — TAMSULOSIN HCL 0.4 MG PO CAPS
0.4000 mg | ORAL_CAPSULE | Freq: Every day | ORAL | Status: DC
  Administered 2022-08-03 – 2022-08-09 (×7): 0.4 mg via ORAL
  Filled 2022-08-03 (×7): qty 1

## 2022-08-03 MED ORDER — ACETAMINOPHEN 325 MG PO TABS: 325.0000 mg | ORAL_TABLET | ORAL | Status: DC | PRN

## 2022-08-03 MED ORDER — HYDROCODONE-ACETAMINOPHEN 5-325 MG PO TABS
2.0000 | ORAL_TABLET | ORAL | Status: DC | PRN
  Administered 2022-08-03 – 2022-08-09 (×12): 2 via ORAL
  Filled 2022-08-03 (×12): qty 2

## 2022-08-03 MED ORDER — DIPHENHYDRAMINE HCL 12.5 MG/5ML PO ELIX: 12.5000 mg | ORAL_SOLUTION | Freq: Four times a day (QID) | ORAL | Status: DC | PRN

## 2022-08-03 MED ORDER — FLEET ENEMA 7-19 GM/118ML RE ENEM: 1.0000 | ENEMA | Freq: Once | RECTAL | Status: DC | PRN

## 2022-08-03 MED ORDER — MENTHOL 3 MG MT LOZG: 1.0000 | LOZENGE | OROMUCOSAL | Status: DC | PRN

## 2022-08-03 MED ORDER — PROCHLORPERAZINE MALEATE 5 MG PO TABS: 5.0000 mg | ORAL_TABLET | Freq: Four times a day (QID) | ORAL | Status: DC | PRN

## 2022-08-03 MED ORDER — ALUM & MAG HYDROXIDE-SIMETH 200-200-20 MG/5ML PO SUSP: 30.0000 mL | Freq: Four times a day (QID) | ORAL | Status: DC | PRN

## 2022-08-03 MED ORDER — ADULT MULTIVITAMIN W/MINERALS CH
1.0000 | ORAL_TABLET | Freq: Every day | ORAL | Status: DC
  Administered 2022-08-04 – 2022-08-10 (×7): 1 via ORAL
  Filled 2022-08-03 (×7): qty 1

## 2022-08-03 MED ORDER — METHOCARBAMOL 750 MG PO TABS: 750.0000 mg | ORAL_TABLET | Freq: Four times a day (QID) | ORAL | 0 refills | Status: DC

## 2022-08-03 MED ORDER — GUAIFENESIN-DM 100-10 MG/5ML PO SYRP: 5.0000 mL | ORAL_SOLUTION | Freq: Four times a day (QID) | ORAL | Status: DC | PRN

## 2022-08-03 MED ORDER — ALUM & MAG HYDROXIDE-SIMETH 200-200-20 MG/5ML PO SUSP: 30.0000 mL | ORAL | Status: DC | PRN

## 2022-08-03 MED ORDER — DILTIAZEM HCL ER COATED BEADS 120 MG PO CP24
120.0000 mg | ORAL_CAPSULE | Freq: Every day | ORAL | Status: DC
  Administered 2022-08-04 – 2022-08-10 (×7): 120 mg via ORAL
  Filled 2022-08-03 (×7): qty 1

## 2022-08-03 MED ORDER — HYDROCODONE-ACETAMINOPHEN 5-325 MG PO TABS: 1.0000 | ORAL_TABLET | ORAL | 0 refills | Status: DC | PRN

## 2022-08-03 MED ORDER — PRIMIDONE 50 MG PO TABS
150.0000 mg | ORAL_TABLET | Freq: Two times a day (BID) | ORAL | Status: DC
  Administered 2022-08-03 – 2022-08-10 (×14): 150 mg via ORAL
  Filled 2022-08-03 (×14): qty 3

## 2022-08-03 MED ORDER — PROCHLORPERAZINE 25 MG RE SUPP: 12.5000 mg | Freq: Four times a day (QID) | RECTAL | Status: DC | PRN

## 2022-08-03 MED ORDER — PHENOL 1.4 % MT LIQD: 1.0000 | OROMUCOSAL | Status: DC | PRN

## 2022-08-03 MED ORDER — RISAQUAD PO CAPS
1.0000 | ORAL_CAPSULE | Freq: Two times a day (BID) | ORAL | Status: DC
  Administered 2022-08-03 – 2022-08-10 (×14): 1 via ORAL
  Filled 2022-08-03 (×14): qty 1

## 2022-08-03 MED ORDER — CYCLOBENZAPRINE HCL 10 MG PO TABS: 10.0000 mg | ORAL_TABLET | Freq: Three times a day (TID) | ORAL | Status: DC | PRN

## 2022-08-03 MED ORDER — BISACODYL 10 MG RE SUPP: 10.0000 mg | Freq: Every day | RECTAL | Status: DC | PRN

## 2022-08-03 MED ORDER — TRAZODONE HCL 50 MG PO TABS: 25.0000 mg | ORAL_TABLET | Freq: Every evening | ORAL | Status: DC | PRN

## 2022-08-03 MED ORDER — PROCHLORPERAZINE EDISYLATE 10 MG/2ML IJ SOLN: 5.0000 mg | Freq: Four times a day (QID) | INTRAMUSCULAR | Status: DC | PRN

## 2022-08-03 MED ORDER — PANTOPRAZOLE SODIUM 40 MG PO TBEC
40.0000 mg | DELAYED_RELEASE_TABLET | Freq: Every day | ORAL | Status: DC
  Administered 2022-08-03 – 2022-08-09 (×7): 40 mg via ORAL
  Filled 2022-08-03 (×7): qty 1

## 2022-08-03 MED ORDER — METOPROLOL SUCCINATE ER 50 MG PO TB24
100.0000 mg | ORAL_TABLET | Freq: Every day | ORAL | Status: DC
  Administered 2022-08-04 – 2022-08-10 (×7): 100 mg via ORAL
  Filled 2022-08-03 (×7): qty 2

## 2022-08-03 MED ORDER — POLYETHYLENE GLYCOL 3350 17 G PO PACK: 17.0000 g | PACK | Freq: Every day | ORAL | Status: DC | PRN

## 2022-08-03 MED ORDER — FUROSEMIDE 20 MG PO TABS
20.0000 mg | ORAL_TABLET | Freq: Every day | ORAL | Status: DC
  Administered 2022-08-04 – 2022-08-10 (×7): 20 mg via ORAL
  Filled 2022-08-03 (×7): qty 1

## 2022-08-03 MED ORDER — NITROGLYCERIN 0.4 MG SL SUBL: 0.4000 mg | SUBLINGUAL_TABLET | SUBLINGUAL | Status: DC | PRN

## 2022-08-03 NOTE — Progress Notes (Signed)
Mobility Specialist Progress Note   08/03/22 1140  Mobility  Activity Ambulated with assistance in hallway  Level of Assistance Minimal assist, patient does 75% or more  Assistive Device Front wheel walker  Distance Ambulated (ft) 80 ft (60+20)  Activity Response Tolerated well  $Mobility charge 1 Mobility   Received pt in chair c/o general back pain but agreeable to mobility. X1 standing rest break d/t fatigue + min cues required for proper body mechanics while using RW. Returned back to bed where pt required minA d/t LE weakness. Call bell left in reach w/ bed alarm on.  Holland Falling Mobility Specialist MS [facility] #:  [phone] Acute Rehab Office:  [phone]

## 2022-08-03 NOTE — H&P (Signed)
Physical Medicine and Rehabilitation Admission H&P     CC: Functional deficits.      HPI: Bryan [name] is a 76 year old male with history of HTN, OSA, RA, PE/DVT, seizures in past, prior L2-S1 fusion but started developing progressive low back pain radiating to hip due to pseudoarthrosis at L5-S1 with loosening of screws.  He was admitted on 09/80/23 for ALIF, exploration of fusion with removal of hardware and posterior lateral arthrodesis L4 to-S1 by Dr. Saintclair Halsted and Dr. Carlis Abbott.  Postprocedure reported decrease in lower extremity pain but continued to be limited by RLE greater than LLE weakness, impaired balance, decreased activity tolerance as well as back pain.  Labs yesterday showed hypokalemia with potassium down to 3.3 as well as acute renal failure with rise in BUN/serum creatinine to 27/1.67 treated with IVF as well as kdur for supplementation.   PT/OT has been working with patient and CIR was recommended due to functional decline.     Review of Systems  Constitutional:  Negative for chills and fever.  HENT:  Positive for hearing loss.   Eyes:  Negative for blurred vision and double vision.  Respiratory:  Negative for cough and shortness of breath.   Cardiovascular:  Negative for chest pain and palpitations.  Gastrointestinal:  Negative for constipation and heartburn.  Genitourinary:  Negative for dysuria and urgency.  Musculoskeletal:  Positive for myalgias.  Neurological:  Positive for weakness.  Psychiatric/Behavioral:  The patient is not nervous/anxious and does not have insomnia.             Past Medical History:  Diagnosis Date   Anemia     Arthritis      hands, knees, cervical area. Back pain. Rheumatoid arthritis- weekly injections.   BPH (benign prostatic hypertrophy)     Cancer (HCC)     Diabetes (HCC)     Essential tremor     GAD (generalized anxiety disorder) 12/05/2021   GERD (gastroesophageal reflux disease)      reports for indigestion he uses mustard     Hearing deficit      wears hearing aids bilateral   HTN (hypertension)     Obesity     OSA (obstructive sleep apnea)     Refusal of blood transfusions as patient is Jehovah's Witness     Rheumatoid arthritis ([place])     Right bundle branch block      history of   Seizures ([place])      AS A CHILD. only esential tremors now.   Sleep apnea      cpap - settings at 9 per patient    Ulcer             Past Surgical History:  Procedure Laterality Date   ANTERIOR CERVICAL DECOMP/DISCECTOMY FUSION N/A 03/30/2013    Procedure: ANTERIOR CERVICAL DECOMPRESSION/DISCECTOMY FUSION 2 LEVELS;  Surgeon: Otilio Connors, MD;  Location: [facility];  Service: Neurosurgery;  Laterality: N/A;  C4-5 C5-6 Anterior cervical decompression/diskectomy/fusion/Allograft/Plate   CHOLECYSTECTOMY N/A 12/07/2013    Procedure: LAPAROSCOPIC CHOLECYSTECTOMY WITH INTRAOPERATIVE CHOLANGIOGRAM;  Surgeon: Adin Hector, MD;  Location: [facility];  Service: General;  Laterality: N/A;   COLONOSCOPY       DECOMPRESSIVE LUMBAR LAMINECTOMY LEVEL 2 N/A 02/15/2015    Procedure: COMPLETE DECOMPRESSIVE LUMBAR LAMINECTOMY L4-L5/ FORAMINOTOMY TO L4 NERVE ROOT AND L5 NERVE ROOT BILATERALLY;  Surgeon: [name], MD;  Location: [facility];  Service: Orthopedics;  Laterality: N/A;   EYE SURGERY  right, growth excision   LUMBAR LAMINECTOMY/DECOMPRESSION MICRODISCECTOMY Left 01/18/2016    Procedure:  DECOMPRESSION L4-L5 MICRODISCECTOMY L4-L5 ON LEFT FOR SPINAL STENOSIS;  Surgeon: [name], MD;  Location: [facility];  Service: Orthopedics;  Laterality: Left;   PAROTIDECTOMY Left 01/24/2018    Procedure: INCISIONAL BIOPSY OF LEFT PAROTID    MASS;  Surgeon: Helayne Seminole, MD;  Location: [facility];  Service: ENT;  Laterality: Left;   SPINE SURGERY       TONSILLECTOMY       VASECTOMY       WRIST GANGLION EXCISION Left             Family History  Problem Relation Age of Onset   Colon cancer Maternal Grandfather     Kidney disease Brother      Hypertension Brother     Diabetes Brother     Stroke Maternal Grandmother     Diabetes Mother     Hypertension Mother     CAD Father          died of MI at age 70   Hypertension Father     Diabetes Paternal Aunt          x 4 aunts      Social History:  Married. Independent PTA. Used to drive a bus--retired. He  reports that he quit smoking about 49 years ago. His smoking use included cigarettes. He has a 7.00 pack-year smoking history. He has never used smokeless tobacco. He reports current alcohol use. He reports that he does not use drugs.          Allergies  Allergen Reactions   Terazosin Other (See Comments)      Hypotension   Adalimumab Other (See Comments)      (Humira) Lymphoma   Statins Other (See Comments)      Memory issues   Ampicillin Nausea Only      Tolerated ancef on 07-30-22 without issue intraoperativelty   Lisinopril Cough   Other Other (See Comments)      BLOOD PRODUCT REFUSAL (patient is a Jehovah's Witness)              Medications Prior to Admission  Medication Sig Dispense Refill   acetaminophen (TYLENOL) 500 MG tablet Take 1,000 mg by mouth daily as needed for moderate pain.       dabigatran (PRADAXA) 150 MG CAPS capsule Take 1 capsule (150 mg total) by mouth 2 (two) times daily. 60 capsule 3   diltiazem (CARDIZEM CD) 120 MG 24 hr capsule Take 1 capsule (120 mg total) by mouth daily. 90 capsule 2   furosemide (LASIX) 20 MG tablet Take 1 tablet (20 mg total) by mouth daily. 90 tablet 2   metoprolol succinate (TOPROL-XL) 100 MG 24 hr tablet TAKE 1 TABLET BY MOUTH ONCE DAILY WITH  OR  IMMEDIATELY  FOLLOWING  A  MEAL 90 tablet 2   Multiple Vitamin (MULTIVITAMIN WITH MINERALS) TABS tablet Take 1 tablet by mouth daily.       nitroGLYCERIN (NITROSTAT) 0.4 MG SL tablet Place 0.4 mg under the tongue every 5 (five) minutes as needed for chest pain.       primidone (MYSOLINE) 50 MG tablet Take 150 mg by mouth 2 (two) times daily.       Tamsulosin HCl (FLOMAX)  0.4 MG CAPS Take 0.4 mg by mouth at bedtime.       hydrALAZINE (APRESOLINE) 50 MG tablet Take 1 tablet (50 mg total) by mouth in  the morning and at bedtime. (Patient not taking: Reported on 07/26/2022) 60 tablet 3   LACTOBACILLUS PO Take 1 capsule by mouth 2 (two) times daily.              Home: Home Living Family/patient expects to be discharged to:: Private residence Living Arrangements: Spouse/significant other Available Help at Discharge: Family, Available 24 hours/day Type of Home: House Home Access: Stairs to enter [employer] of Steps: 1 Home Layout: One level Bathroom Shower/Tub: Chiropodist: Standard Bathroom Accessibility: Yes Home Equipment: Radio producer - single point  Lives With: Spouse   Functional History: Prior Function Prior Level of Function : Independent/Modified Independent, Driving Mobility Comments: amb with no AD, no falls ADLs Comments: cooking, cleaning, grocery shopping, driving all with MOD I:pt performs ADL with MOD I-increased time due to pain   Functional Status:  Mobility: Bed Mobility Overal bed mobility: Needs Assistance Bed Mobility: Rolling, Sidelying to Sit, Sit to Sidelying Rolling: Min assist Sidelying to sit: Mod assist Sit to sidelying: Mod assist General bed mobility comments: Mod A to lift trunk, increased effort to scoot EOB. Mod A to get BLE back into bed with consistent cues to keep spine straight Transfers Overall transfer level: Needs assistance Equipment used: Rolling walker (2 wheels) Transfers: Sit to/from Stand Sit to Stand: Min assist Bed to/from chair/wheelchair/BSC transfer type:: Step pivot Stand pivot transfers: Mod assist Step pivot transfers: Min assist General transfer comment: Min A to stand from elevated bed, noted to pull on RW. min guard to stand from toilet with BSC placed on top Ambulation/Gait Ambulation/Gait assistance: Min assist Gait Distance (Feet): 12 Feet Assistive device:  Rolling walker (2 wheels) Gait Pattern/deviations: Step-through pattern, Decreased stride length, Wide base of support, Shuffle General Gait Details: decreased foot clearance bilaterally. No overt LOB. MinA for balance and RW management at times. Chair follow beneficial for longer distances Gait velocity: decreased Gait velocity interpretation: <1.8 ft/sec, indicate of risk for recurrent falls   ADL: ADL Overall ADL's : Needs assistance/impaired Grooming: Wash/dry hands, Wash/dry face, Oral care, Sitting, Set up Lower Body Bathing: Maximal assistance Lower Body Bathing Details (indicate cue type and reason): anticipated Lower Body Dressing: Maximal assistance Toilet Transfer: Minimal assistance, Ambulation, Rolling walker (2 wheels) Toilet Transfer Details (indicate cue type and reason): BSC over toilet. Min A to manuever RW in/out of bathroom and assist to prevent RW from getting too far in front of pt Toileting- Clothing Manipulation and Hygiene: Moderate assistance, Sit to/from stand Toileting - Clothing Manipulation Details (indicate cue type and reason): assist for clothing mgmt and hygiene Functional mobility during ADLs: Minimal assistance, Rolling walker (2 wheels), Cueing for sequencing, Cueing for safety General ADL Comments: Limited by back pain, R LE weakness and new use of DME requiring hands on assist and consistent cues   Cognition: Cognition Overall Cognitive Status: Impaired/Different from baseline Orientation Level: Oriented to person, Oriented to place, Oriented to situation, Disoriented to time Cognition Arousal/Alertness: Awake/alert Behavior During Therapy: WFL for tasks assessed/performed Overall Cognitive Status: Impaired/Different from baseline Area of Impairment: Safety/judgement, Awareness, Problem solving Safety/Judgement: Decreased awareness of safety, Decreased awareness of deficits Awareness: Emergent Problem Solving: Difficulty sequencing, Requires  verbal cues, Requires tactile cues General Comments: pleasant, follows one step directions well. difficulty implementing back precautions, requires safety cues for DME mgmt     Blood pressure 126/66, pulse 88, temperature 97.7 F (36.5 C), temperature source Oral, resp. rate 17, height '5\' 5"'$  (1.651 m), weight 93 kg, SpO2 94 %.  Physical Exam Vitals and nursing note reviewed.  Constitutional:      Appearance: Normal appearance. He is obese.  HENT:     Head: Normocephalic.     Right Ear: Tympanic membrane normal.     Left Ear: Tympanic membrane normal.     Nose: Nose normal.     Mouth/Throat:     Mouth: Mucous membranes are moist.  Eyes:     Conjunctiva/sclera: Conjunctivae normal.     Pupils: Pupils are equal, round, and reactive to light.  Cardiovascular:     Rate and Rhythm: Normal rate and regular rhythm.     Heart sounds: No murmur heard.    No gallop.  Pulmonary:     Effort: Pulmonary effort is normal. No respiratory distress.     Breath sounds: No wheezing.  Abdominal:     General: There is no distension.     Palpations: Abdomen is soft.     Tenderness: There is no abdominal tenderness.  Musculoskeletal:     Cervical back: Normal range of motion.  Skin:    General: Skin is warm and dry.     Comments: Back incision with dry dressing.   Neurological:     Mental Status: He is alert and oriented to person, place, and time.     Comments: Alert and oriented x 3. Normal insight and awareness. Intact Memory. Normal language and speech. Cranial nerve exam unremarkable. UE 5/5. RLE 2-3/5 prox to 4/5 distal. LLE 3-/5 prox to 4/5 distal.  DTR's 1+. No abnl resting tone.  Psychiatric:        Mood and Affect: Mood normal.        Behavior: Behavior normal.        Lab Results Last 48 Hours        Results for orders placed or performed during the hospital encounter of 07/30/22 (from the past 48 hour(s))  Basic metabolic panel     Status: Abnormal    Collection Time: 08/02/22  1:25  AM  Result Value Ref Range    Sodium 138 135 - 145 mmol/L    Potassium 3.3 (L) 3.5 - 5.1 mmol/L    Chloride 105 98 - 111 mmol/L    CO2 22 22 - 32 mmol/L    Glucose, Bld 96 70 - 99 mg/dL      Comment: Glucose reference range applies only to samples taken after fasting for at least 8 hours.    BUN 27 (H) 8 - 23 mg/dL    Creatinine, Ser 1.67 (H) 0.61 - 1.24 mg/dL    Calcium 8.2 (L) 8.9 - 10.3 mg/dL    GFR, Estimated 42 (L) >60 mL/min      Comment: (NOTE) Calculated using the CKD-EPI Creatinine Equation (2021)      Anion gap 11 5 - 15      Comment: Performed at [facility] [address] [place], [place] [address]      Imaging Results (Last 48 hours)  No results found.         Blood pressure 126/66, pulse 88, temperature 97.7 F (36.5 C), temperature source Oral, resp. rate 17, height '5\' 5"'$  (1.651 m), weight 93 kg, SpO2 94 %.   Medical Problem List and Plan: 1. Functional deficits secondary to psuedoarthrosis at L5-S1 with hardware loosening s/p ALIF with removal of hardware, and posterior lateral arthrodesis L4-S1 on 9/8             -patient may shower             -  ELOS/Goals: 7-10 days, supervision goals 2. H/o PE/DVT Antithrombotics: -DVT/anticoagulation:  Mechanical: Sequential compression devices, below knee Bilateral lower extremities             -antiplatelet therapy: N/A 3. Pain Management: Hydrocodone and/or flexeril prn.              -consider scheduling pain medication depending upon activity tolerance  4. Mood/Behavior/Sleep: LCSW to follow-up for evaluation and support.             -antipsychotic agents: N/A 5. Neuropsych/cognition: This patient is capable of making decisions on is own behalf. 6. Skin/Wound Care: Monitor incision for healing. --Routine pressure-relief measures. 7. Fluids/Electrolytes/Nutrition: Monitor I/O. Encourage intake. 8. HTN: Monitor BP TID--continue Toprol XL, lasix and Cardizem.  --recheck labs in AM  9. AKI/Hypokalemia: Recheck  labs in am.  10. H/o tremors: Continue primidone  11.  Anemia: Baseline Hgb around 10 per records review.  --Monitor H/H. Recheck CBC on 09/25.  12. Pre-diabetes: Hgb A1C-5.8 on 01/2022 13. PAF: Monitor HR TID. Stable on Toprol and Cardizem for rate control.  14. Marginal B cell lymphoma/RA: Managed with Rituxan         Bary Leriche, PA-C 08/03/2022    I have personally performed a face to face diagnostic evaluation of this patient and formulated the key components of the plan.  Additionally, I have personally reviewed laboratory data, imaging studies, as well as relevant notes and concur with the physician assistant's documentation above.  The patient's status has not changed from the original H&P.  Any changes in documentation from the acute care chart have been noted above.  Meredith Staggers, MD, [facility]

## 2022-08-03 NOTE — H&P (Signed)
Physical Medicine and Rehabilitation Admission H&P    CC: Functional deficits.    HPI: Bryan [name] is a 76 year old male with history of HTN, OSA, RA, PE/DVT, seizures in past, prior L2-S1 fusion but started developing progressive low back pain radiating to hip due to pseudoarthrosis at L5-S1 with loosening of screws.  He was admitted on 09/80/23 for ALIF, exploration of fusion with removal of hardware and posterior lateral arthrodesis L4 to-S1 by Dr. Saintclair Halsted and Dr. Carlis Abbott.  Postprocedure reported decrease in lower extremity pain but continued to be limited by RLE greater than LLE weakness, impaired balance, decreased activity tolerance as well as back pain.  Labs yesterday showed hypokalemia with potassium down to 3.3 as well as acute renal failure with rise in BUN/serum creatinine to 27/1.67 treated with IVF as well as kdur for supplementation.   PT/OT has been working with patient and CIR was recommended due to functional decline.   Review of Systems  Constitutional:  Negative for chills and fever.  HENT:  Positive for hearing loss.   Eyes:  Negative for blurred vision and double vision.  Respiratory:  Negative for cough and shortness of breath.   Cardiovascular:  Negative for chest pain and palpitations.  Gastrointestinal:  Negative for constipation and heartburn.  Genitourinary:  Negative for dysuria and urgency.  Musculoskeletal:  Positive for myalgias.  Neurological:  Positive for weakness.  Psychiatric/Behavioral:  The patient is not nervous/anxious and does not have insomnia.      Past Medical History:  Diagnosis Date   Anemia    Arthritis    hands, knees, cervical area. Back pain. Rheumatoid arthritis- weekly injections.   BPH (benign prostatic hypertrophy)    Cancer (HCC)    Diabetes (HCC)    Essential tremor    GAD (generalized anxiety disorder) 12/05/2021   GERD (gastroesophageal reflux disease)    reports for indigestion he uses mustard    Hearing deficit     wears hearing aids bilateral   HTN (hypertension)    Obesity    OSA (obstructive sleep apnea)    Refusal of blood transfusions as patient is Jehovah's Witness    Rheumatoid arthritis ([place])    Right bundle branch block    history of   Seizures ([place])    AS A CHILD. only esential tremors now.   Sleep apnea    cpap - settings at 9 per patient    Ulcer     Past Surgical History:  Procedure Laterality Date   ANTERIOR CERVICAL DECOMP/DISCECTOMY FUSION N/A 03/30/2013   Procedure: ANTERIOR CERVICAL DECOMPRESSION/DISCECTOMY FUSION 2 LEVELS;  Surgeon: Otilio Connors, MD;  Location: [facility];  Service: Neurosurgery;  Laterality: N/A;  C4-5 C5-6 Anterior cervical decompression/diskectomy/fusion/Allograft/Plate   CHOLECYSTECTOMY N/A 12/07/2013   Procedure: LAPAROSCOPIC CHOLECYSTECTOMY WITH INTRAOPERATIVE CHOLANGIOGRAM;  Surgeon: Adin Hector, MD;  Location: [facility];  Service: General;  Laterality: N/A;   COLONOSCOPY     DECOMPRESSIVE LUMBAR LAMINECTOMY LEVEL 2 N/A 02/15/2015   Procedure: COMPLETE DECOMPRESSIVE LUMBAR LAMINECTOMY L4-L5/ FORAMINOTOMY TO L4 NERVE ROOT AND L5 NERVE ROOT BILATERALLY;  Surgeon: [name], MD;  Location: [facility];  Service: Orthopedics;  Laterality: N/A;   EYE SURGERY     right, growth excision   LUMBAR LAMINECTOMY/DECOMPRESSION MICRODISCECTOMY Left 01/18/2016   Procedure:  DECOMPRESSION L4-L5 MICRODISCECTOMY L4-L5 ON LEFT FOR SPINAL STENOSIS;  Surgeon: [name], MD;  Location: [facility];  Service: Orthopedics;  Laterality: Left;   PAROTIDECTOMY Left 01/24/2018  Procedure: INCISIONAL BIOPSY OF LEFT PAROTID    MASS;  Surgeon: Helayne Seminole, MD;  Location: [facility];  Service: ENT;  Laterality: Left;   SPINE SURGERY     TONSILLECTOMY     VASECTOMY     WRIST GANGLION EXCISION Left     Family History  Problem Relation Age of Onset   Colon cancer Maternal Grandfather    Kidney disease Brother    Hypertension Brother    Diabetes Brother    Stroke Maternal  Grandmother    Diabetes Mother    Hypertension Mother    CAD Father        died of MI at age 45   Hypertension Father    Diabetes Paternal Aunt        x 4 aunts    Social History:  Married. Independent PTA. Used to drive a bus--retired. He  reports that he quit smoking about 49 years ago. His smoking use included cigarettes. He has a 7.00 pack-year smoking history. He has never used smokeless tobacco. He reports current alcohol use. He reports that he does not use drugs.   Allergies  Allergen Reactions   Terazosin Other (See Comments)    Hypotension   Adalimumab Other (See Comments)    (Humira) Lymphoma   Statins Other (See Comments)    Memory issues   Ampicillin Nausea Only    Tolerated ancef on 07-30-22 without issue intraoperativelty   Lisinopril Cough   Other Other (See Comments)    BLOOD PRODUCT REFUSAL (patient is a Jehovah's Witness)     Medications Prior to Admission  Medication Sig Dispense Refill   acetaminophen (TYLENOL) 500 MG tablet Take 1,000 mg by mouth daily as needed for moderate pain.     dabigatran (PRADAXA) 150 MG CAPS capsule Take 1 capsule (150 mg total) by mouth 2 (two) times daily. 60 capsule 3   diltiazem (CARDIZEM CD) 120 MG 24 hr capsule Take 1 capsule (120 mg total) by mouth daily. 90 capsule 2   furosemide (LASIX) 20 MG tablet Take 1 tablet (20 mg total) by mouth daily. 90 tablet 2   metoprolol succinate (TOPROL-XL) 100 MG 24 hr tablet TAKE 1 TABLET BY MOUTH ONCE DAILY WITH  OR  IMMEDIATELY  FOLLOWING  A  MEAL 90 tablet 2   Multiple Vitamin (MULTIVITAMIN WITH MINERALS) TABS tablet Take 1 tablet by mouth daily.     nitroGLYCERIN (NITROSTAT) 0.4 MG SL tablet Place 0.4 mg under the tongue every 5 (five) minutes as needed for chest pain.     primidone (MYSOLINE) 50 MG tablet Take 150 mg by mouth 2 (two) times daily.     Tamsulosin HCl (FLOMAX) 0.4 MG CAPS Take 0.4 mg by mouth at bedtime.     hydrALAZINE (APRESOLINE) 50 MG tablet Take 1 tablet (50 mg  total) by mouth in the morning and at bedtime. (Patient not taking: Reported on 07/26/2022) 60 tablet 3   LACTOBACILLUS PO Take 1 capsule by mouth 2 (two) times daily.        Home: Home Living Family/patient expects to be discharged to:: Private residence Living Arrangements: Spouse/significant other Available Help at Discharge: Family, Available 24 hours/day Type of Home: House Home Access: Stairs to enter [employer] of Steps: 1 Home Layout: One level Bathroom Shower/Tub: Chiropodist: Standard Bathroom Accessibility: Yes Home Equipment: Kasandra Knudsen - single point  Lives With: Spouse   Functional History: Prior Function Prior Level of Function : Independent/Modified Independent, Driving Mobility Comments:  amb with no AD, no falls ADLs Comments: cooking, cleaning, grocery shopping, driving all with MOD I:pt performs ADL with MOD I-increased time due to pain  Functional Status:  Mobility: Bed Mobility Overal bed mobility: Needs Assistance Bed Mobility: Rolling, Sidelying to Sit, Sit to Sidelying Rolling: Min assist Sidelying to sit: Mod assist Sit to sidelying: Mod assist General bed mobility comments: Mod A to lift trunk, increased effort to scoot EOB. Mod A to get BLE back into bed with consistent cues to keep spine straight Transfers Overall transfer level: Needs assistance Equipment used: Rolling walker (2 wheels) Transfers: Sit to/from Stand Sit to Stand: Min assist Bed to/from chair/wheelchair/BSC transfer type:: Step pivot Stand pivot transfers: Mod assist Step pivot transfers: Min assist General transfer comment: Min A to stand from elevated bed, noted to pull on RW. min guard to stand from toilet with BSC placed on top Ambulation/Gait Ambulation/Gait assistance: Min assist Gait Distance (Feet): 12 Feet Assistive device: Rolling walker (2 wheels) Gait Pattern/deviations: Step-through pattern, Decreased stride length, Wide base of support,  Shuffle General Gait Details: decreased foot clearance bilaterally. No overt LOB. MinA for balance and RW management at times. Chair follow beneficial for longer distances Gait velocity: decreased Gait velocity interpretation: <1.8 ft/sec, indicate of risk for recurrent falls    ADL: ADL Overall ADL's : Needs assistance/impaired Grooming: Wash/dry hands, Wash/dry face, Oral care, Sitting, Set up Lower Body Bathing: Maximal assistance Lower Body Bathing Details (indicate cue type and reason): anticipated Lower Body Dressing: Maximal assistance Toilet Transfer: Minimal assistance, Ambulation, Rolling walker (2 wheels) Toilet Transfer Details (indicate cue type and reason): BSC over toilet. Min A to manuever RW in/out of bathroom and assist to prevent RW from getting too far in front of pt Toileting- Clothing Manipulation and Hygiene: Moderate assistance, Sit to/from stand Toileting - Clothing Manipulation Details (indicate cue type and reason): assist for clothing mgmt and hygiene Functional mobility during ADLs: Minimal assistance, Rolling walker (2 wheels), Cueing for sequencing, Cueing for safety General ADL Comments: Limited by back pain, R LE weakness and new use of DME requiring hands on assist and consistent cues  Cognition: Cognition Overall Cognitive Status: Impaired/Different from baseline Orientation Level: Oriented to person, Oriented to place, Oriented to situation, Disoriented to time Cognition Arousal/Alertness: Awake/alert Behavior During Therapy: WFL for tasks assessed/performed Overall Cognitive Status: Impaired/Different from baseline Area of Impairment: Safety/judgement, Awareness, Problem solving Safety/Judgement: Decreased awareness of safety, Decreased awareness of deficits Awareness: Emergent Problem Solving: Difficulty sequencing, Requires verbal cues, Requires tactile cues General Comments: pleasant, follows one step directions well. difficulty implementing  back precautions, requires safety cues for DME mgmt   Blood pressure 126/66, pulse 88, temperature 97.7 F (36.5 C), temperature source Oral, resp. rate 17, height '5\' 5"'$  (1.651 m), weight 93 kg, SpO2 94 %. Physical Exam Vitals and nursing note reviewed.  Constitutional:      Appearance: Normal appearance. He is obese.  HENT:     Head: Normocephalic.     Right Ear: Tympanic membrane normal.     Left Ear: Tympanic membrane normal.     Nose: Nose normal.     Mouth/Throat:     Mouth: Mucous membranes are moist.  Eyes:     Conjunctiva/sclera: Conjunctivae normal.     Pupils: Pupils are equal, round, and reactive to light.  Cardiovascular:     Rate and Rhythm: Normal rate and regular rhythm.     Heart sounds: No murmur heard.    No gallop.  Pulmonary:  Effort: Pulmonary effort is normal. No respiratory distress.     Breath sounds: No wheezing.  Abdominal:     General: There is no distension.     Palpations: Abdomen is soft.     Tenderness: There is no abdominal tenderness.  Musculoskeletal:     Cervical back: Normal range of motion.  Skin:    General: Skin is warm and dry.     Comments: Back incision with dry dressing.   Neurological:     Mental Status: He is alert and oriented to person, place, and time.     Comments: Alert and oriented x 3. Normal insight and awareness. Intact Memory. Normal language and speech. Cranial nerve exam unremarkable. UE 5/5. RLE 2-3/5 prox to 4/5 distal. LLE 3-/5 prox to 4/5 distal.  DTR's 1+. No abnl resting tone.  Psychiatric:        Mood and Affect: Mood normal.        Behavior: Behavior normal.     Results for orders placed or performed during the hospital encounter of 07/30/22 (from the past 48 hour(s))  Basic metabolic panel     Status: Abnormal   Collection Time: 08/02/22  1:25 AM  Result Value Ref Range   Sodium 138 135 - 145 mmol/L   Potassium 3.3 (L) 3.5 - 5.1 mmol/L   Chloride 105 98 - 111 mmol/L   CO2 22 22 - 32 mmol/L    Glucose, Bld 96 70 - 99 mg/dL    Comment: Glucose reference range applies only to samples taken after fasting for at least 8 hours.   BUN 27 (H) 8 - 23 mg/dL   Creatinine, Ser 1.67 (H) 0.61 - 1.24 mg/dL   Calcium 8.2 (L) 8.9 - 10.3 mg/dL   GFR, Estimated 42 (L) >60 mL/min    Comment: (NOTE) Calculated using the CKD-EPI Creatinine Equation (2021)    Anion gap 11 5 - 15    Comment: Performed at [facility] [address] [place], [place] [address]   No results found.    Blood pressure 126/66, pulse 88, temperature 97.7 F (36.5 C), temperature source Oral, resp. rate 17, height '5\' 5"'$  (1.651 m), weight 93 kg, SpO2 94 %.  Medical Problem List and Plan: 1. Functional deficits secondary to psuedoarthrosis at L5-S1 with hardware loosening s/p ALIF with removal of hardware, and posterior lateral arthrodesis L4-S1 on 9/8  -patient may shower  -ELOS/Goals: 7-10 days, supervision goals 2. H/o PE/DVT Antithrombotics: -DVT/anticoagulation:  Mechanical: Sequential compression devices, below knee Bilateral lower extremities  -antiplatelet therapy: N/A 3. Pain Management: Hydrocodone and/or flexeril prn.   -consider scheduling pain medication depending upon activity tolerance  4. Mood/Behavior/Sleep: LCSW to follow-up for evaluation and support.  -antipsychotic agents: N/A 5. Neuropsych/cognition: This patient is capable of making decisions on is own behalf. 6. Skin/Wound Care: Monitor incision for healing. --Routine pressure-relief measures. 7. Fluids/Electrolytes/Nutrition: Monitor I/O. Encourage intake. 8. HTN: Monitor BP TID--continue Toprol XL, lasix and Cardizem.  --recheck labs in AM  9. AKI/Hypokalemia: Recheck labs in am.  10. H/o tremors: Continue primidone  11.  Anemia: Baseline Hgb around 10 per records review.  --Monitor H/H. Recheck CBC on 09/25.  12. Pre-diabetes: Hgb A1C-5.8 on 01/2022 13. PAF: Monitor HR TID. Stable on Toprol and Cardizem for rate control.  14.  Marginal B cell lymphoma/RA: Managed with Rituxan        Bary Leriche, PA-C 08/03/2022   I have personally performed a face to face diagnostic evaluation  of this patient and formulated the key components of the plan.  Additionally, I have personally reviewed laboratory data, imaging studies, as well as relevant notes and concur with the physician assistant's documentation above.  The patient's status has not changed from the original H&P.  Any changes in documentation from the acute care chart have been noted above.  Meredith Staggers, MD, Mellody Drown

## 2022-08-03 NOTE — Progress Notes (Signed)
Physical Therapy Treatment Patient Details Name: Bryan [name] MRN: [id number] DOB: [birth year] Today's Date: 08/03/2022   History of Present Illness Pt is a 76 year old male s/p L5-S1 ALIF 07/30/22; PMH significant for stage IIb marginal zone lymphoma, rheumatoid arthritis, anemia, CAD with mild ischemia (04/2020), paroxysmal atrial flutter, h/o post-op PE-currently on Pradaxa, pulmonary hypertension.    PT Comments    Patient making significant progress this session. Increasing ambulation to 90' with min guard-minA and RW. Continues to demo R LE weakness but improved since last session. Able to perform repeated sit to stand x 5 with min guard. Motivated and eager to get to rehab in hopes to return home soon. Continue to recommend acute inpatient rehab (AIR) for post-acute therapy needs.     Recommendations for follow up therapy are one component of a multi-disciplinary discharge planning process, led by the attending physician.  Recommendations may be updated based on patient status, additional functional criteria and insurance authorization.  Follow Up Recommendations  Acute inpatient rehab (3hours/day)     Assistance Recommended at Discharge Frequent or constant Supervision/Assistance  Patient can return home with the following A Bryan help with bathing/dressing/bathroom;A lot of help with walking and/or transfers;Assistance with cooking/housework;Assist for transportation;Help with stairs or ramp for entrance   Equipment Recommendations  Rolling Chaz Ronning (2 wheels);BSC/3in1    Recommendations for Other Services       Precautions / Restrictions Precautions Precautions: Back;Fall Precaution Booklet Issued: Yes (comment) Precaution Comments: no brace needed per orders Restrictions Weight Bearing Restrictions: No     Mobility  Bed Mobility Overal bed mobility: Needs Assistance Bed Mobility: Rolling, Sidelying to Sit, Sit to Sidelying Rolling: Min assist Sidelying to sit: Min  assist     Sit to sidelying: Min assist General bed mobility comments: assist for LE management to get in/out of bed    Transfers Overall transfer level: Needs assistance Equipment used: Rolling [place] (2 wheels) Transfers: Sit to/from Stand Sit to Stand: Min guard, Min assist           General transfer comment: minA from EOB and min guard from recliner x 5. Constant cues for hand placement    Ambulation/Gait Ambulation/Gait assistance: Min assist Gait Distance (Feet): 90 Feet Assistive device: Rolling Janeah Kovacich (2 wheels) Gait Pattern/deviations: Step-through pattern, Decreased stride length, Wide base of support, Shuffle Gait velocity: decreased     General Gait Details: cues for upright posture. Assist for RW management at times and balance. Decreased foot clearance R>L but improved from previous session   Stairs             Wheelchair Mobility    Modified Rankin (Stroke Patients Only)       Balance Overall balance assessment: Needs assistance Sitting-balance support: Feet supported Sitting balance-Leahy Scale: Fair     Standing balance support: Bilateral upper extremity supported, During functional activity, Reliant on assistive device for balance Standing balance-Leahy Scale: Poor Standing balance comment: reliant on RW                            Cognition Arousal/Alertness: Awake/alert Behavior During Therapy: WFL for tasks assessed/performed Overall Cognitive Status: Impaired/Different from baseline Area of Impairment: Safety/judgement, Awareness, Problem solving                         Safety/Judgement: Decreased awareness of safety, Decreased awareness of deficits Awareness: Emergent Problem Solving: Difficulty sequencing, Requires verbal cues, Requires  tactile cues General Comments: improved processing and awareness this session but still impaired        Exercises Other Exercises Other Exercises: Sit to stand x 5  from recliner    General Comments        Pertinent Vitals/Pain Pain Assessment Pain Assessment: Faces Faces Pain Scale: Hurts a Bryan bit Pain Location: lower back Pain Descriptors / Indicators: Discomfort, Grimacing Pain Intervention(s): Monitored during session    Home Living                          Prior Function            PT Goals (current goals can now be found in the care plan section) Acute Rehab PT Goals PT Goal Formulation: With patient Time For Goal Achievement: 08/14/22 Potential to Achieve Goals: Good Progress towards PT goals: Progressing toward goals    Frequency    Min 5X/week      PT Plan Current plan remains appropriate    Co-evaluation              AM-PAC PT "6 Clicks" Mobility   Outcome Measure  Help needed turning from your back to your side while in a flat bed without using bedrails?: A Bryan Help needed moving from lying on your back to sitting on the side of a flat bed without using bedrails?: A Bryan Help needed moving to and from a bed to a chair (including a wheelchair)?: A Bryan Help needed standing up from a chair using your arms (e.g., wheelchair or bedside chair)?: A Bryan Help needed to walk in hospital room?: A Bryan Help needed climbing 3-5 steps with a railing? : Total 6 Click Score: 16    End of Session Equipment Utilized During Treatment: Gait belt Activity Tolerance: Patient tolerated treatment well Patient left: in bed;with call bell/phone within reach (rehab present to take him down) Nurse Communication: Mobility status PT Visit Diagnosis: Unsteadiness on feet (R26.81);Difficulty in walking, not elsewhere classified (R26.2);Pain     Time: 8563-1497 PT Time Calculation (min) (ACUTE ONLY): 30 min  Charges:  $Therapeutic Exercise: 8-22 mins $Therapeutic Activity: 8-22 mins                     Rayden Dock A. Gilford Rile PT, DPT Acute Rehabilitation Services Office [phone]    Linna Hoff 08/03/2022, 4:55 PM

## 2022-08-03 NOTE — Progress Notes (Signed)
Inpatient Rehabilitation Admission Medication Review by a Pharmacist   A complete drug regimen review was completed for this patient to identify any potential clinically significant medication issues.   High Risk Drug Classes Is patient taking? Indication by Medication  Antipsychotic Yes Benadryl PO - itching Compazine (PO/IM) - nausea/vomiting Robitussin DM - cough  Anticoagulant No    Antibiotic No    Opioid Yes Norco - pain  Antiplatelet No    Hypoglycemics/insulin No    Vasoactive Medication Yes Metoprolol, diltiazem - rate control Furosemide - HTN  Chemotherapy No    Other Yes Primidone - tremor Tamsulosin - BPH Protonix - GERD Nitroglycerin (SL) - chest pain Trazodone - sleep Flexeril - muscle spasms        Type of Medication Issue Identified Description of Issue Recommendation(s)  Drug Interaction(s) (clinically significant)        Duplicate Therapy        Allergy        No Medication Administration End Date        Incorrect Dose        Additional Drug Therapy Needed   Pradaxa to resume on 08/06/22 per NSGY - history of PE/AFlutter Monitor renal function - if CrCl < 30 mL/min, dose will need to be adjusted  Significant med changes from prior encounter (inform family/care partners about these prior to discharge).      Other            Clinically significant medication issues were identified that warrant physician communication and completion of prescribed/recommended actions by midnight of the next day:  No   Name of provider notified for urgent issues identified:    Provider Method of Notification:    Pharmacist comments:    Time spent performing this drug regimen review (minutes):  Bureau, PharmD, BCPS 08/03/2022 2:25 PM

## 2022-08-03 NOTE — Progress Notes (Signed)
Inpatient Rehab Admissions Coordinator:  There is a bed available in CIR for pt today. Margo Aye, NP is aware and in agreement. Pt, pt's wife, NSG, TOC made aware.   Gayland Curry, [place], [place] Admissions Coordinator [phone]

## 2022-08-03 NOTE — Discharge Summary (Addendum)
Physician Discharge Summary  Patient ID: Bryan [name] MRN: [id number] DOB/AGE: December 25, 1945 76 y.o.  Admit date: 07/30/2022 Discharge date: 08/03/2022  Admission Diagnoses: Degenerative disc disease L5-S1 pseudoarthrosis L5-S1.      Discharge Diagnoses: same   Discharged Condition: good  Hospital Course: The patient was admitted on 07/30/2022 and taken to the operating room where the patient underwent ALIF L5-S1 with posterior instrumentation. The patient tolerated the procedure well and was taken to the recovery room and then to the floor in stable condition. The hospital course was routine. There were no complications. The wound remained clean dry and intact. Pt had appropriate back soreness. No complaints of leg pain or new N/T/W. The patient remained afebrile with stable vital signs, and tolerated a regular diet. The patient continued to increase activities, and pain was well controlled with oral pain medications.   Consults: None  Significant Diagnostic Studies:  Results for orders placed or performed during the hospital encounter of 07/30/22  Glucose, capillary  Result Value Ref Range   Glucose-Capillary 111 (H) 70 - 99 mg/dL  Basic metabolic panel  Result Value Ref Range   Sodium 138 135 - 145 mmol/L   Potassium 3.3 (L) 3.5 - 5.1 mmol/L   Chloride 105 98 - 111 mmol/L   CO2 22 22 - 32 mmol/L   Glucose, Bld 96 70 - 99 mg/dL   BUN 27 (H) 8 - 23 mg/dL   Creatinine, Ser 1.67 (H) 0.61 - 1.24 mg/dL   Calcium 8.2 (L) 8.9 - 10.3 mg/dL   GFR, Estimated 42 (L) >60 mL/min   Anion gap 11 5 - 15    DG Lumbar Spine 1 View  Result Date: 07/30/2022 CLINICAL DATA:  Lumbar fusion EXAM: LUMBAR SPINE - 1 VIEW COMPARISON:  05/03/2022 lumbar spine radiographs FINDINGS: One fluoroscopic image obtained during the performance of the procedure and provided for interpretation only. Imaged demonstrates anterior lumbar interbody fusion at L5-S1. Fluoroscopy time: 29 seconds Cumulative air kerma: 34.86  mGy IMPRESSION: Expected intraoperative appearance a ALIF L5-S1. Electronically Signed   By: Merilyn Baba M.D.   On: 07/30/2022 14:56   DG O-ARM IMAGE ONLY/NO REPORT  Result Date: 07/30/2022 There is no Radiologist interpretation  for this exam.  DG C-Arm 1-60 Min-No Report  Result Date: 07/30/2022 Fluoroscopy was utilized by the requesting physician.  No radiographic interpretation.   DG C-Arm 1-60 Min-No Report  Result Date: 07/30/2022 Fluoroscopy was utilized by the requesting physician.  No radiographic interpretation.   DG C-Arm 1-60 Min-No Report  Result Date: 07/30/2022 Fluoroscopy was utilized by the requesting physician.  No radiographic interpretation.   DG C-Arm 1-60 Min-No Report  Result Date: 07/30/2022 Fluoroscopy was utilized by the requesting physician.  No radiographic interpretation.   DG C-Arm 1-60 Min-No Report  Result Date: 07/30/2022 Fluoroscopy was utilized by the requesting physician.  No radiographic interpretation.   DG OR LOCAL ABDOMEN  Result Date: 07/30/2022 CLINICAL DATA:  Lumbar fusion.  Rule out retained foreign body. EXAM: OR LOCAL ABDOMEN COMPARISON:  07/22/2018 FINDINGS: Lumbar fusion hardware without complicating features. No unexpected radiopaque foreign body. IMPRESSION: No unexpected radiopaque foreign body. Electronically Signed   By: Marijo Sanes M.D.   On: 07/30/2022 10:25   PCV ECHOCARDIOGRAM COMPLETE  Addendum Date: 07/18/2022   Echocardiogram 07/17/2022: Normal LV systolic function with visual EF 60-65%. Left ventricle cavity is normal in size. Normal left ventricular wall thickness. Normal global wall motion. Doppler evidence of grade I (impaired) diastolic dysfunction, indeterminate LAP. Calculated  EF 67%. Left atrial cavity is mildly dilated. Structurally normal trileaflet aortic valve.  Trace aortic regurgitation. Structurally normal mitral valve.  Mild (Grade I) mitral regurgitation. Structurally normal tricuspid valve.  Mild to  moderate tricuspid regurgitation. Compared to echo in March 2023, No evidence of pulmonary hypertension.   Result Date: 07/18/2022 Echocardiogram 07/17/2022: Normal LV systolic function with visual EF 60-65%. Left ventricle cavity is normal in size. Normal left ventricular wall thickness. Normal global wall motion. Doppler evidence of grade I (impaired) diastolic dysfunction, indeterminate LAP. Calculated EF 67%. Left atrial cavity is mildly dilated. Structurally normal trileaflet aortic valve.  Trace aortic regurgitation. Structurally normal mitral valve.  Mild (Grade I) mitral regurgitation. Structurally normal tricuspid valve.  Mild to moderate tricuspid regurgitation. No evidence of pulmonary hypertension. no prior available for comparison.    Antibiotics:  Anti-infectives (From admission, onward)    Start     Dose/Rate Route Frequency Ordered Stop   07/31/22 0500  vancomycin (VANCOCIN) IVPB 1000 mg/200 mL premix  Status:  Discontinued        1,000 mg 200 mL/hr over 60 Minutes Intravenous Every 24 hours 07/30/22 1832 08/03/22 1007   07/30/22 0600  vancomycin (VANCOCIN) IVPB 1000 mg/200 mL premix        1,000 mg 200 mL/hr over 60 Minutes Intravenous On call to O.R. 07/30/22 0556 07/30/22 0814       Discharge Exam: Blood pressure 126/66, pulse 88, temperature 97.7 F (36.5 C), temperature source Oral, resp. rate 17, height '5\' 5"'$  (1.651 m), weight 93 kg, SpO2 94 %. Neurologic: Grossly normal Ambulating and voiding well incision cdi   Discharge Medications:   Allergies as of 08/03/2022       Reactions   Terazosin Other (See Comments)   Hypotension   Adalimumab Other (See Comments)   (Humira) Lymphoma   Statins Other (See Comments)   Memory issues   Ampicillin Nausea Only   Tolerated ancef on 07-30-22 without issue intraoperativelty   Lisinopril Cough   Other Other (See Comments)   BLOOD PRODUCT REFUSAL (patient is a Jehov[employer]'s Witness)        Medication List     STOP taking  these medications    dabigatran 150 MG Caps capsule Commonly known as: Pradaxa       TAKE these medications    acetaminophen 500 MG tablet Commonly known as: TYLENOL Take 1,000 mg by mouth daily as needed for moderate pain.   diltiazem 120 MG 24 hr capsule Commonly known as: CARDIZEM CD Take 1 capsule (120 mg total) by mouth daily.   furosemide 20 MG tablet Commonly known as: LASIX Take 1 tablet (20 mg total) by mouth daily.   hydrALAZINE 50 MG tablet Commonly known as: APRESOLINE Take 1 tablet (50 mg total) by mouth in the morning and at bedtime.   HYDROcodone-acetaminophen 5-325 MG tablet Commonly known as: NORCO/VICODIN Take 1-2 tablets by mouth every 4 (four) hours as needed for severe pain ((score 7 to 10)).   LACTOBACILLUS PO Take 1 capsule by mouth 2 (two) times daily.   methocarbamol 750 MG tablet Commonly known as: Robaxin-750 Take 1 tablet (750 mg total) by mouth 4 (four) times daily.   metoprolol succinate 100 MG 24 hr tablet Commonly known as: TOPROL-XL TAKE 1 TABLET BY MOUTH ONCE DAILY WITH  OR  IMMEDIATELY  FOLLOWING  A  MEAL   multivitamin with minerals Tabs tablet Take 1 tablet by mouth daily.   nitroGLYCERIN 0.4 MG SL tablet Commonly known as:  NITROSTAT Place 0.4 mg under the tongue every 5 (five) minutes as needed for chest pain.   primidone 50 MG tablet Commonly known as: MYSOLINE Take 150 mg by mouth 2 (two) times daily.   tamsulosin 0.4 MG Caps capsule Commonly known as: FLOMAX Take 0.4 mg by mouth at bedtime.        Disposition: CIR   Final Dx: ALIF L5-S1 with posterior instrumentation  ** restart pradaxa Monday**  Discharge Instructions     Call MD for:  difficulty breathing, headache or visual disturbances   Complete by: As directed    Call MD for:  hives   Complete by: As directed    Call MD for:  persistant nausea and vomiting   Complete by: As directed    Call MD for:  redness, tenderness, or signs of infection  (pain, swelling, redness, odor or green/yellow discharge around incision site)   Complete by: As directed    Call MD for:  severe uncontrolled pain   Complete by: As directed    Call MD for:  temperature >100.4   Complete by: As directed    Diet - low sodium heart healthy   Complete by: As directed    Increase activity slowly   Complete by: As directed    Lifting restrictions   Complete by: As directed    No lifting more than 8 lbs   No wound care   Complete by: As directed           Signed: Ocie Cornfield Chandrika Sandles 08/03/2022, 12:23 PM

## 2022-08-03 NOTE — Progress Notes (Addendum)
Signed     PMR Admission Coordinator Pre-Admission Assessment   Patient: Bryan [name] is an 76 y.o., male MRN: [id number] DOB: 05-Jul-1946 Height: 5' 5"  (165.1 cm) Weight: 93 kg   Insurance Information HMO:     PPO:      PCP:      IPA:      80/20:      OTHER:  PRIMARY: [facility]       Policy#: [id number]      Subscriber: Pt            CM Name: Sherlynn Carbon      Phone#: [phone]     Fax#: [phone] Pre-Cert#: Received approval 08/02/21 from Sherlynn Carbon. Pt approved for up to 30 days as indicated      Employer:  Eff Date: 04/09/2018 - still active Deductible: $0 OOP Max: $0 As long as Referral from ER was received the following will be true:  CIR: 100% coverage, auth required SNF: 100% coverage, auth required Outpatient:  100% coverage, auth required Home Health:  100% coverage, auth required DME: 100% coverage, auth required Providers: in network   SECONDARY: [employer]     Policy#: [id number]     Phone#:    Financial Counselor:       Phone#:    The "Data Collection Information Summary" for patients in Inpatient Rehabilitation Facilities with attached "Privacy Act [place] Records" was provided and verbally reviewed with: Patient   Emergency Contact Information Contact Information       Name Relation Home Work Mobile    Golf    [place], [place] [phone]    [place] [phone]               Current Medical History  Patient Admitting Diagnosis: Lumbar Myelopathy History of Present Illness: Pt. Is a  76 year old gentleman with progressive worsening back pain bilateral hip and leg pain who previously had an  L2-S1 fusion and did well initially. However, reports he  has had progressive worsening low back pain rating down L5-S1 nerve root pattern.  Work-up  revealed progressive and persistent pseudoarthrosis at L5-S1 with loosening of both bilateral L5 and S1  screws.  Due to patient's progression of clinical syndrome imaging findings failed conservative treatment Neurosurgery recommended anterior lateral interbody fusion at L5-S1 and then subsequent posterior exploration fusion removal of hardware and replacement of loose L5 and S1 screws with iliac fixation.  Pt. Underwent L5-S1 ALIF 07/30/22. PT and OT were consulted and recommend AIR to assist return to PLOF.      Patient's medical record from [facility] has been reviewed by the rehabilitation admission coordinator and physician.   Past Medical History      Past Medical History:  Diagnosis Date   Anemia     Arthritis      hands, knees, cervical area. Back pain. Rheumatoid arthritis- weekly injections.   BPH (benign prostatic hypertrophy)     Cancer (HCC)     Diabetes (HCC)     Essential tremor     GAD (generalized anxiety disorder) 12/05/2021   GERD (gastroesophageal reflux disease)      reports for indigestion he uses mustard    Hearing deficit      wears hearing aids bilateral   HTN (hypertension)     Obesity     OSA (obstructive sleep apnea)     Refusal of blood transfusions as patient is Jehovah's Witness  Rheumatoid arthritis ([place])     Right bundle branch block      history of   Seizures (HCC)      AS A CHILD. only esential tremors now.   Sleep apnea      cpap - settings at 9 per patient    Ulcer        Has the patient had major surgery during 100 days prior to admission? No   Family History   family history includes CAD in his father; Colon cancer in his maternal grandfather; Diabetes in his brother, mother, and paternal aunt; Hypertension in his brother, father, and mother; Kidney disease in his brother; Stroke in his maternal grandmother.   Current Medications   Current Facility-Administered Medications:    0.9 %  sodium chloride infusion, 250 mL, Intravenous, Continuous, Kary Kos, MD, Last Rate: 1 mL/hr at 07/31/22 0610, 250 mL at 07/31/22 0610    acetaminophen (TYLENOL) tablet 650 mg, 650 mg, Oral, Q4H PRN, 650 mg at 07/31/22 0948 **OR** acetaminophen (TYLENOL) suppository 650 mg, 650 mg, Rectal, Q4H PRN, Kary Kos, MD   acidophilus (RISAQUAD) capsule 1 capsule, 1 capsule, Oral, BID, Kary Kos, MD, 1 capsule at 08/02/22 2216   alum & mag hydroxide-simeth (MAALOX/MYLANTA) 200-200-20 MG/5ML suspension 30 mL, 30 mL, Oral, Q6H PRN, Kary Kos, MD   cyclobenzaprine (FLEXERIL) tablet 10 mg, 10 mg, Oral, TID PRN, Kary Kos, MD, 10 mg at 07/31/22 0948   diltiazem (CARDIZEM CD) 24 hr capsule 120 mg, 120 mg, Oral, Daily, Kary Kos, MD, 120 mg at 08/02/22 1058   furosemide (LASIX) tablet 20 mg, 20 mg, Oral, Daily, Kary Kos, MD, 20 mg at 08/02/22 1059   HYDROcodone-acetaminophen (NORCO/VICODIN) 5-325 MG per tablet 2 tablet, 2 tablet, Oral, Q4H PRN, Kary Kos, MD, 2 tablet at 08/01/22 2104   HYDROmorphone (DILAUDID) injection 0.5 mg, 0.5 mg, Intravenous, Q2H PRN, Kary Kos, MD   menthol-cetylpyridinium (CEPACOL) lozenge 3 mg, 1 lozenge, Oral, PRN **OR** phenol (CHLORASEPTIC) mouth spray 1 spray, 1 spray, Mouth/Throat, PRN, Kary Kos, MD   metoprolol succinate (TOPROL-XL) 24 hr tablet 100 mg, 100 mg, Oral, Daily, Kary Kos, MD, 100 mg at 08/02/22 1057   multivitamin with minerals tablet 1 tablet, 1 tablet, Oral, Daily, Kary Kos, MD, 1 tablet at 08/02/22 1059   nitroGLYCERIN (NITROSTAT) SL tablet 0.4 mg, 0.4 mg, Sublingual, Q5 min PRN, Kary Kos, MD   ondansetron [facility]) tablet 4 mg, 4 mg, Oral, Q6H PRN **OR** ondansetron (ZOFRAN) injection 4 mg, 4 mg, Intravenous, Q6H PRN, Kary Kos, MD   pantoprazole (PROTONIX) EC tablet 40 mg, 40 mg, Oral, QHS, Pierce, Dwayne A, RPH, 40 mg at 08/02/22 2215   primidone (MYSOLINE) tablet 150 mg, 150 mg, Oral, BID, Kary Kos, MD, 150 mg at 08/02/22 2215   sodium chloride flush (NS) 0.9 % injection 3 mL, 3 mL, Intravenous, Q12H, Kary Kos, MD, 3 mL at 08/02/22 2218   sodium chloride flush (NS) 0.9 %  injection 3 mL, 3 mL, Intravenous, PRN, Kary Kos, MD   tamsulosin [facility]) capsule 0.4 mg, 0.4 mg, Oral, QHS, Kary Kos, MD, 0.4 mg at 08/02/22 2215   Patients Current Diet:  Diet Order                  Diet regular Room service appropriate? No; Fluid consistency: Thin  Diet effective now                         Precautions /  Restrictions Precautions Precautions: Back, Fall Precaution Booklet Issued: Yes (comment) Precaution Comments: no brace needed per orders Restrictions Weight Bearing Restrictions: No    Has the patient had 2 or more falls or a fall with injury in the past year? No   Prior Activity Level Pt. Acitve in the community PTA     Prior Functional Level Self Care: Did the patient need help bathing, dressing, using the toilet or eating? Independent   Indoor Mobility: Did the patient need assistance with walking from room to room (with or without device)? Independent   Stairs: Did the patient need assistance with internal or external stairs (with or without device)? Independent   Functional Cognition: Did the patient need help planning regular tasks such as shopping or remembering to take medications? Independent   Patient Information Are you of Hispanic, Latino/a,or Spanish origin?: A. No, not of Hispanic, Latino/a, or Spanish origin What is your race?: B. Black or African American Do you need or want an interpreter to communicate with a doctor or health care staff?: 0. No   Patient's Response To:  Health Literacy and Transportation Is the patient able to respond to health literacy and transportation needs?: Yes Health Literacy - How often do you need to have someone help you when you read instructions, pamphlets, or other written material from your doctor or pharmacy?: Never In the past 12 months, has lack of transportation kept you from medical appointments or from getting medications?: No In the past 12 months, has lack of transportation kept you from  meetings, work, or from getting things needed for daily living?: No   Development worker, international aid / [place] Devices/Equipment: Cabin crew, Eyeglasses Home Equipment: Kasandra Knudsen - single point   Prior Device Use: Indicate devices/aids used by the patient prior to current illness, exacerbation or injury? Walker   Current Functional Level Cognition   Overall Cognitive Status: Impaired/Different from baseline Orientation Level: Oriented to person, Oriented to place, Disoriented to time Safety/Judgement: Decreased awareness of safety, Decreased awareness of deficits General Comments: pleasant, follows one step directions well. difficulty implementing back precautions, requires safety cues for DME mgmt    Extremity Assessment (includes Sensation/Coordination)   Upper Extremity Assessment: Overall WFL for tasks assessed  Lower Extremity Assessment: Defer to PT evaluation RLE Deficits / Details: Grossly 5/5 except hip flexion 4/5 LLE Deficits / Details: Grossly 5/5 exception hip flexion 4/5     ADLs   Overall ADL's : Needs assistance/impaired Grooming: Wash/dry hands, Wash/dry face, Oral care, Sitting, Set up Lower Body Bathing: Maximal assistance Lower Body Bathing Details (indicate cue type and reason): anticipated Lower Body Dressing: Maximal assistance Toilet Transfer: Minimal assistance, Ambulation, Rolling walker (2 wheels) Toilet Transfer Details (indicate cue type and reason): BSC over toilet. Min A to manuever RW in/out of bathroom and assist to prevent RW from getting too far in front of pt Toileting- Clothing Manipulation and Hygiene: Moderate assistance, Sit to/from stand Toileting - Clothing Manipulation Details (indicate cue type and reason): assist for clothing mgmt and hygiene Functional mobility during ADLs: Minimal assistance, Rolling walker (2 wheels), Cueing for sequencing, Cueing for safety General ADL Comments: Limited by back pain, R LE weakness and new use of  DME requiring hands on assist and consistent cues     Mobility   Overal bed mobility: Needs Assistance Bed Mobility: Rolling, Sidelying to Sit, Sit to Sidelying Rolling: Min assist Sidelying to sit: Mod assist Sit to sidelying: Mod assist General bed mobility comments: Mod A to  lift trunk, increased effort to scoot EOB. Mod A to get BLE back into bed with consistent cues to keep spine straight     Transfers   Overall transfer level: Needs assistance Equipment used: Rolling walker (2 wheels) Transfers: Sit to/from Stand Sit to Stand: Min assist Bed to/from chair/wheelchair/BSC transfer type:: Step pivot Stand pivot transfers: Mod assist Step pivot transfers: Min assist General transfer comment: Min A to stand from elevated bed, noted to pull on RW. min guard to stand from toilet with BSC placed on top     Ambulation / Gait / Stairs / Wheelchair Mobility   Ambulation/Gait Ambulation/Gait assistance: Herbalist (Feet): 12 Feet Assistive device: Rolling walker (2 wheels) Gait Pattern/deviations: Step-through pattern, Decreased stride length, Wide base of support, Shuffle General Gait Details: decreased foot clearance bilaterally. No overt LOB. MinA for balance and RW management at times. Chair follow beneficial for longer distances Gait velocity: decreased Gait velocity interpretation: <1.8 ft/sec, indicate of risk for recurrent falls     Posture / Balance Balance Overall balance assessment: Needs assistance Sitting-balance support: Feet supported Sitting balance-Leahy Scale: Fair Standing balance support: Bilateral upper extremity supported, During functional activity, Reliant on assistive device for balance Standing balance-Leahy Scale: Poor Standing balance comment: reliant on RW     Special needs/care consideration Skin Surgical incision: abdomen, back     Previous Home Environment (from acute therapy documentation) Living Arrangements: Spouse/significant  other  Lives With: Spouse Available Help at Discharge: Family, Available 24 hours/day Type of Home: House Home Layout: One level Home Access: Stairs to enter [employer] of Steps: 1 Bathroom Shower/Tub: Optometrist: Yes Home Care Services: No   Discharge Living Setting Plans for Discharge Living Setting: Patient's home Type of Home at Discharge: House Discharge Home Layout: One level Discharge Home Access: Stairs to enter Entrance Stairs-Rails: None Entrance Stairs-Number of Steps: 1 Discharge Bathroom Shower/Tub: Tub/shower unit Discharge Bathroom Toilet: Standard Discharge Bathroom Accessibility: No Does the patient have any problems obtaining your medications?: No   Social/Family/Support Systems Patient Roles: Spouse Contact Information: [phone] Anticipated Caregiver: 24/7 Anticipated Caregiver's Contact Information: [place] Ability/Limitations of Caregiver: MinA Caregiver Availability: 24/7 Discharge Plan Discussed with Primary Caregiver: Yes Is Caregiver In Agreement with Plan?: Yes Does Caregiver/Family have Issues with Lodging/Transportation while Pt is in Rehab?: No   Goals Patient/Family Goal for Rehab: PT/OT Supervision Expected length of stay: 7-10 days Pt/Family Agrees to Admission and willing to participate: No Program Orientation Provided & Reviewed with Pt/Caregiver Including Roles  & Responsibilities: No   Decrease burden of Care through IP rehab admission: none    Possible need for SNF placement upon discharge: not anticipated    Patient Condition: I have reviewed medical records from [facility] , spoken with CM, and patient and family member. I met with patient at the bedside for inpatient rehabilitation assessment.  Patient will benefit from ongoing PT and OT, can actively participate in 3 hours of therapy a day 5 days of the week, and can make measurable gains  during the admission.  Patient will also benefit from the coordinated team approach during an Inpatient Acute Rehabilitation admission.  The patient will receive intensive therapy as well as Rehabilitation physician, nursing, social worker, and care management interventions.  Due to safety, skin/wound care, disease management, medication administration, pain management, and patient education the patient requires 24 hour a day rehabilitation nursing.  The patient is currently Min A with mobility and basic  ADLs.  Discharge setting and therapy post discharge at home with home health is anticipated.  Patient has agreed to participate in the Acute Inpatient Rehabilitation Program and will admit today.   Preadmission Screen Completed By:  Genella Mech, with updates by Gayland Curry 08/03/2022 10:47 AM ______________________________________________________________________   Discussed status with Dr. Naaman Plummer on 08/03/22  at 10:47 AM and received approval for admission today.   Admission Coordinator:  Genella Mech, CCC-SLP,with updates by Gayland Curry, MS, CCC-SLP time 10:47 AM/Date 08/03/22     Assessment/Plan: Diagnosis: lumbar myelopathy Does the need for close, 24 hr/day Medical supervision in concert with the patient's rehab needs make it unreasonable for this patient to be served in a less intensive setting? Yes Co-Morbidities requiring supervision/potential complications: post-op pain, wound care issues, dm, htn, obesity, RA Due to bladder management, bowel management, safety, skin/wound care, disease management, medication administration, pain management, and patient education, does the patient require 24 hr/day rehab nursing? Yes Does the patient require coordinated care of a physician, rehab nurse, PT, OT, and  to address physical and functional deficits in the context of the above medical diagnosis(es)? Yes Addressing deficits in the following areas: balance, endurance, locomotion,  strength, transferring, bowel/bladder control, bathing, dressing, feeding, grooming, toileting, and psychosocial support Can the patient actively participate in an intensive therapy program of at least 3 hrs of therapy 5 days a week? Yes The potential for patient to make measurable gains while on inpatient rehab is excellent Anticipated functional outcomes upon discharge from inpatient rehab: supervision PT, supervision OT, n/a SLP Estimated rehab length of stay to reach the above functional goals is: 7-10 days Anticipated discharge destination: Home 10. Overall Rehab/Functional Prognosis: excellent     MD Signature: Meredith Staggers, MD, [place] Director Rehabilitation Services 08/03/2022

## 2022-08-04 DIAGNOSIS — R2689 Other abnormalities of gait and mobility: Secondary | ICD-10-CM | POA: Diagnosis not present

## 2022-08-04 DIAGNOSIS — D62 Acute posthemorrhagic anemia: Secondary | ICD-10-CM | POA: Diagnosis not present

## 2022-08-04 DIAGNOSIS — M5106 Intervertebral disc disorders with myelopathy, lumbar region: Secondary | ICD-10-CM | POA: Diagnosis not present

## 2022-08-04 DIAGNOSIS — I10 Essential (primary) hypertension: Secondary | ICD-10-CM | POA: Diagnosis not present

## 2022-08-04 DIAGNOSIS — I480 Paroxysmal atrial fibrillation: Secondary | ICD-10-CM | POA: Diagnosis not present

## 2022-08-04 LAB — COMPREHENSIVE METABOLIC PANEL
ALT: 15 U/L (ref 0–44)
AST: 28 U/L (ref 15–41)
Albumin: 2.5 g/dL — ABNORMAL LOW (ref 3.5–5.0)
Alkaline Phosphatase: 94 U/L (ref 38–126)
Anion gap: 11 (ref 5–15)
BUN: 25 mg/dL — ABNORMAL HIGH (ref 8–23)
CO2: 23 mmol/L (ref 22–32)
Calcium: 8.4 mg/dL — ABNORMAL LOW (ref 8.9–10.3)
Chloride: 102 mmol/L (ref 98–111)
Creatinine, Ser: 1.5 mg/dL — ABNORMAL HIGH (ref 0.61–1.24)
GFR, Estimated: 48 mL/min — ABNORMAL LOW (ref >60)
Glucose, Bld: 142 mg/dL — ABNORMAL HIGH (ref 70–99)
Potassium: 4.3 mmol/L (ref 3.5–5.1)
Sodium: 136 mmol/L (ref 135–145)
Total Bilirubin: 0.6 mg/dL (ref 0.3–1.2)
Total Protein: 5.8 g/dL — ABNORMAL LOW (ref 6.5–8.1)

## 2022-08-04 NOTE — Progress Notes (Signed)
Physical Therapy Session Note  Patient Details  Name: Bryan [name] MRN: [id number] Date of Birth: Jan 22, 1946  Today's Date: 08/04/2022 PT Individual Time: 1350-1435, 45 min    Short Term Goals: Week 1:  PT Short Term Goal 1 (Week 1): = LTGs due to ELOS  Skilled Therapeutic Interventions/Progress Updates:  Pt seated in wc.  He reported abdominal pain, surgical 6/10.  Pt needed to use toilet.  Sit> stand with CG, mod cues for best technique to RW.  Gait in room to toilet with CGA.  Toilet transfer with CGA.  Pt managed clothing with CGA to lower; min assist to raise.  Continent of urine.  Peri care by PT for rectum  as pt thought he might have had a BM, but no results.  Hand washing at sink in standing, close supervision.  Strengthening for bil LEs using Kinetron in sitting in wc, resistance 40 cm/sec, x 2 min bilaterally, x 20 cyccles using RLE unilaterally with PT providing resistance on opposite foot plate.  Gait training with RW x 150' on level tile, CGA.  At end of session, sit> supine with min assist.  Session concluded with pt resting with HOB raised, alarm set and needs at hand.     Therapy Documentation Precautions:  Precautions Precautions: Back, Fall Precaution Booklet Issued: Yes (comment) Precaution Comments: no brace needed per orders Restrictions Weight Bearing Restrictions: No         Therapy/Group: Individual Therapy  [name] 08/04/2022, 2:00 PM

## 2022-08-04 NOTE — Evaluation (Signed)
Physical Therapy Assessment and Plan  Patient Details  Name: Bryan [name] MRN: [id number] Date of Birth: 1946-11-08  PT Diagnosis: Difficulty walking, Low back pain, Muscle weakness, and Pain in abdomen  Rehab Potential: Good ELOS: 7 days   Today's Date: 08/04/2022 PT Individual Time: 0800-0918 PT Individual Time Calculation (min): 78 min    Hospital Problem: Principal Problem:   Lumbar disc disorder with myelopathy   Past Medical History:  Past Medical History:  Diagnosis Date   Anemia    Arthritis    hands, knees, cervical area. Back pain. Rheumatoid arthritis- weekly injections.   BPH (benign prostatic hypertrophy)    Cancer (HCC)    Diabetes (HCC)    Essential tremor    GAD (generalized anxiety disorder) 12/05/2021   GERD (gastroesophageal reflux disease)    reports for indigestion he uses mustard    Hearing deficit    wears hearing aids bilateral   HTN (hypertension)    Obesity    OSA (obstructive sleep apnea)    PAF (paroxysmal atrial fibrillation) (HCC)    Refusal of blood transfusions as patient is Jehovah's Witness    Rheumatoid arthritis ([place])    Right bundle branch block    history of   Seizures ([place])    AS A CHILD. only esential tremors now.   Sleep apnea    cpap - settings at 9 per patient    Ulcer    Past Surgical History:  Past Surgical History:  Procedure Laterality Date   ABDOMINAL EXPOSURE N/A 07/30/2022   Procedure: ABDOMINAL EXPOSURE;  Surgeon: Marty Heck, MD;  Location: [facility];  Service: Vascular;  Laterality: N/A;   ANTERIOR CERVICAL DECOMP/DISCECTOMY FUSION N/A 03/30/2013   Procedure: ANTERIOR CERVICAL DECOMPRESSION/DISCECTOMY FUSION 2 LEVELS;  Surgeon: Otilio Connors, MD;  Location: [facility];  Service: Neurosurgery;  Laterality: N/A;  C4-5 C5-6 Anterior cervical decompression/diskectomy/fusion/Allograft/Plate   ANTERIOR LUMBAR FUSION N/A 07/30/2022   Procedure: Anterior Lumbar  Interbody Fusion  - Lumbar five-sacral one  posterior augmentation with iliac screws and O- arm;  Surgeon: Kary Kos, MD;  Location: [facility];  Service: Neurosurgery;  Laterality: N/A;   CHOLECYSTECTOMY N/A 12/07/2013   Procedure: LAPAROSCOPIC CHOLECYSTECTOMY WITH INTRAOPERATIVE CHOLANGIOGRAM;  Surgeon: Adin Hector, MD;  Location: [facility];  Service: General;  Laterality: N/A;   COLONOSCOPY     DECOMPRESSIVE LUMBAR LAMINECTOMY LEVEL 2 N/A 02/15/2015   Procedure: COMPLETE DECOMPRESSIVE LUMBAR LAMINECTOMY L4-L5/ FORAMINOTOMY TO L4 NERVE ROOT AND L5 NERVE ROOT BILATERALLY;  Surgeon: [name], MD;  Location: [facility];  Service: Orthopedics;  Laterality: N/A;   EYE SURGERY     right, growth excision   LAMINECTOMY WITH POSTERIOR LATERAL ARTHRODESIS LEVEL 1 N/A 07/30/2022   Procedure: LAMINECTOMY WITH POSTERIOR LATERAL ARTHRODESIS LUMBAR FIVE SACRAL ONE;  Surgeon: Kary Kos, MD;  Location: [facility];  Service: Neurosurgery;  Laterality: N/A;   LUMBAR LAMINECTOMY/DECOMPRESSION MICRODISCECTOMY Left 01/18/2016   Procedure:  DECOMPRESSION L4-L5 MICRODISCECTOMY L4-L5 ON LEFT FOR SPINAL STENOSIS;  Surgeon: [name], MD;  [facility];  Service: Orthopedics;  Laterality: Left;   PAROTIDECTOMY Left 01/24/2018   Procedure: INCISIONAL BIOPSY OF LEFT PAROTID    MASS;  Surgeon: Helayne Seminole, MD;  Location: [facility];  Service: ENT;  Laterality: Left;   SPINE SURGERY     TONSILLECTOMY     VASECTOMY     WRIST GANGLION EXCISION Left     Assessment & Plan Clinical Impression:Bryan [name] is a 76 year old male  with history of HTN, OSA, RA, PE/DVT, seizures in past, prior L2-S1 fusion but started developing progressive low back pain radiating to hip due to pseudoarthrosis at L5-S1 with loosening of screws.  He was admitted on 09/80/23 for ALIF, exploration of fusion with removal of hardware and posterior lateral arthrodesis L4 to-S1 by Dr. Saintclair Halsted and Dr. Carlis Abbott.  Postprocedure reported decrease in lower extremity pain but continued to be limited by RLE  greater than LLE weakness, impaired balance, decreased activity tolerance as well as back pain.  Labs yesterday showed hypokalemia with potassium down to 3.3 as well as acute renal failure with rise in BUN/serum creatinine to 27/1.67 treated with IVF as well as kdur for supplementation. Patient transferred to CIR on 08/03/2022 .   Patient currently requires mod assistance with mobility secondary to muscle weakness, decreased coordination, and decreased standing balance, decreased balance strategies, and difficulty maintaining precautions.  Prior to hospitalization, patient was independent  with mobility and lived with Spouse in a House home.  Home access is 1 Step to enter home, no rails.  Patient will benefit from skilled PT intervention to maximize safe functional mobility, minimize fall risk, and decrease caregiver burden for planned discharge home with 24 hour supervision.  Anticipate patient will benefit from follow up [place] at discharge.  PT - End of Session Activity Tolerance: Tolerates < 10 min activity with changes in vital signs Endurance Deficit: Yes Endurance Deficit Description: SOB after gait x 50' PT Assessment Rehab Potential (ACUTE/IP ONLY): Good PT Patient demonstrates impairments in the following area(s): Balance;Pain;Endurance;Motor PT Transfers Functional Problem(s): Bed Mobility;Bed to Chair;Car;Furniture PT Locomotion Functional Problem(s): Ambulation;Stairs;Wheelchair Mobility PT Plan PT Intensity: Minimum of 1-2 x/day ,45 to 90 minutes PT Frequency: 5 out of 7 days PT Duration Estimated Length of Stay: 7 days PT Treatment/Interventions: Ambulation/gait training;Cognitive remediation/compensation;Discharge planning;DME/adaptive equipment instruction;Functional mobility training;Pain management;Psychosocial support;Splinting/orthotics;Therapeutic Activities;UE/LE Strength taining/ROM;Balance/vestibular training;Community reintegration;Neuromuscular re-education;Patient/family  education;Stair training;Therapeutic Exercise;UE/LE Coordination activities;Wheelchair propulsion/positioning PT Transfers Anticipated Outcome(s): S for basic, car, furniture PT Locomotion Anticipated Outcome(s): S wiht LRAD for gait x 50' household setting, x 150' controlled setting, x 200' community setting including slightly uneven terrain, up/down ( 1 )4" high curb/step PT Recommendation Follow Up Recommendations: Home health PT Patient destination: Home Equipment Recommended: Rolling walker with 5" wheels Equipment Details: likely will need RW   PT Evaluation Precautions/Restrictions- back precautions; no brace needed   General   Vital Signs Pain  6/10 abdominal pain, surgical;  premedicated Pain Interference Pain Interference Pain Effect on Sleep: 2. Occasionally Pain Interference with Therapy Activities: 1. Rarely or not at all Pain Interference with Day-to-Day Activities: 3. Frequently Home Living/Prior Functioning Home Living Available Help at Discharge: Family;Available 24 hours/day Type of Home: House Home Access: Stairs to enter [employer] of Steps: 1 Entrance Stairs-Rails: None Home Layout: One level Bathroom Shower/Tub: Chiropodist: Standard Bathroom Accessibility: Yes  Lives With: Spouse Prior Function Level of Independence: Independent with homemaking with ambulation;Independent with gait  Able to Take Stairs?: No Driving: Yes Vocation: Retired Radiographer, therapeutic - History Ability to See in Adequate Light: 0 Adequate  Cognition Overall Cognitive Status: History of cognitive impairments - at baseline Arousal/Alertness: Awake/alert Memory: Impaired Memory Impairment: Decreased recall of new information;Decreased short term memory (pt remembered 2/3 words over 10 min; with cue, he remembered 3rd word) Awareness: Appears intact Sensation Sensation Light Touch: Appears Intact Proprioception: Appears  Intact Coordination Gross Motor Movements are Fluid and Coordinated: No Fine Motor Movements are Fluid and Coordinated:  Not tested Heel Shin Test: RLE- decreased speed and excursion Motor  Motor Motor - Skilled Clinical Observations: generalized proximal weakness bil LEs, RLE> LLE   Trunk/Postural Assessment  Cervical Assessment Cervical Assessment: Exceptions to [facility] (limited in bil  rotation and extension) Thoracic Assessment Thoracic Assessment: Exceptions to [facility] (rounded shoulders) Lumbar Assessment Lumbar Assessment: Exceptions to [facility] (back precautions) Postural Control Postural Control: Deficits on evaluation (tends to look at floor during ambulation)  Balance Balance Balance Assessed: Yes Dynamic Sitting Balance Dynamic Sitting - Balance Support: No upper extremity supported Dynamic Sitting - Level of Assistance: 5: Stand by assistance Static Standing Balance Static Standing - Level of Assistance: 4: Min assist Dynamic Standing Balance Dynamic Standing - Balance Support: No upper extremity supported Dynamic Standing - Level of Assistance: 4: Min assist Dynamic Standing - Balance Activities: Lateral lean/weight shifting Dynamic Standing - Comments: 1 inch out of BOS to L/R Extremity Assessment      RLE Assessment RLE Assessment: Exceptions to [facility] General Strength Comments: grossly in sitting: hip flexion 4-/5, knee flex/extesnon and hip adduction/abduction 4/5, ankle DF 5/5 LLE Assessment LLE Assessment: Exceptions to [facility] General Strength Comments: grossly in sitting : hip flexion/abduction/adduction 4/5, knee flex/extension 5/5, ankle DF 5/5  Care Tool Care Tool Bed Mobility Roll left and right activity   Roll left and right assist level: Supervision/Verbal cueing    Sit to lying activity   Sit to lying assist level: Minimal Assistance - Patient > 75%    Lying to sitting on side of bed activity   Lying to sitting on side of bed assist level: the ability to move  from lying on the back to sitting on the side of the bed with no back support.: Minimal Assistance - Patient > 75%     Care Tool Transfers Sit to stand transfer   Sit to stand assist level: Minimal Assistance - Patient > 75%    Chair/bed transfer   Chair/bed transfer assist level: Minimal Assistance - Patient > 75%     Toilet transfer   Assist Level: Minimal Assistance - Patient > 75%    Car transfer   Car transfer assist level: Moderate Assistance - Patient 50 - 74%      Care Tool Locomotion Ambulation   Assist level: Minimal Assistance - Patient > 75% Assistive device: No Device Max distance: 50  Walk 10 feet activity   Assist level: Minimal Assistance - Patient > 75% Assistive device: No Device   Walk 50 feet with 2 turns activity   Assist level: Minimal Assistance - Patient > 75% Assistive device: No Device  Walk 150 feet activity Walk 150 feet activity did not occur: Safety/medical concerns (SOB; fatigue)      Walk 10 feet on uneven surfaces activity Walk 10 feet on uneven surfaces activity did not occur: N/A      Stairs   Assist level: Minimal Assistance - Patient > 75% Stairs assistive device: No device Max number of stairs: 1  Walk up/down 1 step activity   Walk up/down 1 step (curb) assist level: Minimal Assistance - Patient > 75% Walk up/down 1 step or curb assistive device: No device  Walk up/down 4 steps activity Walk up/down 4 steps activity did not occur: N/A      Walk up/down 12 steps activity Walk up/down 12 steps activity did not occur: N/A      Pick up small objects from floor  Safety/medical; pt fatigue and balance deficits      Wheelchair  Is the patient using a wheelchair?: Yes Type of Wheelchair: Manual   Wheelchair assist level: Supervision/Verbal cueing Max wheelchair distance: 50  Wheel 50 feet with 2 turns activity   Assist Level: Supervision/Verbal cueing  Wheel 150 feet activity Wheelchair 150 feet activity did not occur:  Safety/medical concerns (fatigue, SOB)      Refer to Care Plan for [place] 1    Recommendations for other services: None   Skilled Therapeutic Intervention  Pt received lying in bed.  He stated that he needed to use toilet.  Pt continent of urine sitting on toilet.  Total assist for peri hygiene as pt gets wet with urine.  Clothing mgt max assist.  At end of session, pt resting in bed with needs at hand and bed alarm set. Mobility Bed Mobility Bed Mobility: Rolling Right;Rolling Left;Left Sidelying to Sit Rolling Right: Supervision/verbal cueing Rolling Left: Supervision/Verbal cueing Left Sidelying to Sit: Minimal Assistance - Patient >75% Transfers Transfers: Sit to Stand;Stand to Sit;Stand Pivot Transfers Sit to Stand: Contact Guard/Touching assist Stand to Sit: Minimal Assistance - Patient > 75% Stand Pivot Transfers: Minimal Assistance - Patient > 75% Stand Pivot Transfer Details: Verbal cues for precautions/safety;Manual facilitation for weight shifting Stand Pivot Transfer Details (indicate cue type and reason): use of UEs Transfer (Assistive device): None Locomotion  Gait Ambulation: Yes Gait Assistance: Minimal Assistance - Patient > 75% Gait Distance (Feet): 50 Feet Assistive device: None Gait Gait: Yes Gait Pattern: Impaired Gait Pattern: Decreased trunk rotation;Decreased hip/knee flexion - right;Decreased hip/knee flexion - left;Step-through pattern Stairs / Additional Locomotion Stairs: Yes Stair Management Technique: No rails Number of Stairs: 1 Height of Stairs: 4 Wheelchair Mobility Wheelchair Mobility: Yes Wheelchair Assistance: Chartered loss adjuster: Both upper extremities Wheelchair Parts Management: Needs assistance Distance: 50   Discharge Criteria: Patient will be discharged from PT if patient refuses treatment 3 consecutive times without medical reason, if treatment goals not met, if  there is a change in medical status, if patient makes no progress towards goals or if patient is discharged from hospital.  The above assessment, treatment plan, treatment alternatives and goals were discussed and mutually agreed upon: by patient  Delwin Raczkowski 08/04/2022, 5:30 PM

## 2022-08-04 NOTE — Progress Notes (Signed)
PROGRESS NOTE   Subjective/Complaints: Had a pretty good night. Anxious to get started in therapy. Back stiff and sore but manageable   ROS: Patient denies fever, rash, sore throat, blurred vision, dizziness, nausea, vomiting, diarrhea, cough, shortness of breath or chest pain,   headache, or mood change.    Objective:   No results found. No results for input(s): "WBC", "HGB", "HCT", "PLT" in the last 72 hours. Recent Labs    08/02/22 0125 08/04/22 0744  NA 138 136  K 3.3* 4.3  CL 105 102  CO2 22 23  GLUCOSE 96 142*  BUN 27* 25*  CREATININE 1.67* 1.50*  CALCIUM 8.2* 8.4*    Intake/Output Summary (Last 24 hours) at 08/04/2022 1034 Last data filed at 08/04/2022 0800 Gross per 24 hour  Intake 1310 ml  Output 551 ml  Net 759 ml        Physical Exam: Vital Signs Blood pressure 124/74, pulse 76, temperature 98 F (36.7 C), resp. rate 18, height '5\' 5"'$  (1.651 m), weight 100 kg, SpO2 99 %.  General: Alert and oriented x 3, No apparent distress. obese HEENT: Head is normocephalic, atraumatic, PERRLA, EOMI, sclera anicteric, oral mucosa pink and moist, dentition intact, ext ear canals clear,  Neck: Supple without JVD or lymphadenopathy Heart: Reg rate and rhythm. No murmurs rubs or gallops Chest: CTA bilaterally without wheezes, rales, or rhonchi; no distress Abdomen: Soft, non-tender, non-distended, bowel sounds positive. Extremities: No clubbing, cyanosis, or edema. Pulses are 2+ Psych: Pt's affect is appropriate. Pt is cooperative Skin: surgical incision CDI Neuro:  Alert and oriented x 3. Normal insight and awareness. Intact Memory. Normal language and speech. Cranial nerve exam unremarkable. UE 5/5 RLE 2-3/5 prox to 4/5 distally. LLE 3- to 3/5 prox to 4/5 distal. No focal sensory losss Musculoskeletal: LB TTP and with    bed mobility    Assessment/Plan: 1. Functional deficits which require 3+ hours per day of  interdisciplinary therapy in a comprehensive inpatient rehab setting. Physiatrist is providing close team supervision and 24 hour management of active medical problems listed below. Physiatrist and rehab team continue to assess barriers to discharge/monitor patient progress toward functional and medical goals  Care Tool:  Bathing              Bathing assist       Upper Body Dressing/Undressing Upper body dressing        Upper body assist      Lower Body Dressing/Undressing Lower body dressing            Lower body assist       Toileting Toileting    Toileting assist       Transfers Chair/bed transfer  Transfers assist     Chair/bed transfer assist level: Minimal Assistance - Patient > 75%     Locomotion Ambulation   Ambulation assist      Assist level: Minimal Assistance - Patient > 75% Assistive device: No Device Max distance: 50   Walk 10 feet activity   Assist     Assist level: Minimal Assistance - Patient > 75% Assistive device: No Device   Walk 50 feet activity   Assist  Assist level: Minimal Assistance - Patient > 75% Assistive device: No Device    Walk 150 feet activity   Assist Walk 150 feet activity did not occur: Safety/medical concerns (SOB; fatigue)         Walk 10 feet on uneven surface  activity   Assist Walk 10 feet on uneven surfaces activity did not occur: N/A         Wheelchair     Assist Is the patient using a wheelchair?: Yes Type of Wheelchair: Manual    Wheelchair assist level: Supervision/Verbal cueing Max wheelchair distance: 50    Wheelchair 50 feet with 2 turns activity    Assist        Assist Level: Supervision/Verbal cueing   Wheelchair 150 feet activity     Assist  Wheelchair 150 feet activity did not occur: Safety/medical concerns (fatigue, SOB)       Blood pressure 124/74, pulse 76, temperature 98 F (36.7 C), resp. rate 18, height '5\' 5"'$  (1.651 m),  weight 100 kg, SpO2 99 %.  Medical Problem List and Plan: 1. Functional deficits secondary to psuedoarthrosis at L5-S1 with hardware loosening s/p ALIF with removal of hardware, and posterior lateral arthrodesis L4-S1 on 9/8             -patient may shower             -ELOS/Goals: 7-10 days, supervision goals  -Patient is beginning CIR therapies today including PT and OT  2. H/o PE/DVT Antithrombotics: -DVT/anticoagulation:  Mechanical: Sequential compression devices, below knee Bilateral lower extremities             -antiplatelet therapy: N/A 3. Pain Management: Hydrocodone and/or flexeril prn.              -consider scheduling pain medication depending upon activity tolerance  4. Mood/Behavior/Sleep: LCSW to follow-up for evaluation and support.             -antipsychotic agents: N/A 5. Neuropsych/cognition: This patient is capable of making decisions on is own behalf. 6. Skin/Wound Care: Monitor incision for healing. --Routine pressure-relief measures. 7. Fluids/Electrolytes/Nutrition: Monitor I/O. Encourage intake. 8. HTN: Monitor BP TID--continue Toprol XL, lasix and Cardizem.  9/23 presently under control  9. AKI/Hypokalemia: Recheck labs in am.  10. H/o tremors: Continue primidone  11.  Anemia: Baseline Hgb around 10 per records review.  --Monitor H/H. Recheck CBC on 09/25.  12. Pre-diabetes: Hgb A1C-5.8 on 01/2022 13. PAF: Monitor HR TID. Stable on Toprol and Cardizem for rate control.  14. Marginal B cell lymphoma/RA: Managed with Rituxan      LOS: 1 days A FACE TO FACE EVALUATION WAS PERFORMED  Meredith Staggers 08/04/2022, 10:34 AM

## 2022-08-04 NOTE — Evaluation (Signed)
Occupational Therapy Assessment and Plan  Patient Details  Name: Bryan [name] MRN: [id number] Date of Birth: 26-Apr-1946  OT Diagnosis: abnormal posture, acute pain, cognitive deficits, lumbago (low back pain), muscle weakness (generalized), and pain in joint Rehab Potential: Rehab Potential (ACUTE ONLY): Good ELOS: 7- 10 days   Today's Date: 08/04/2022 OT Individual Time: 1045-1200 OT Individual Time Calculation (min): 75 min     Hospital Problem: Principal Problem:   Lumbar disc disorder with myelopathy   Past Medical History:  Past Medical History:  Diagnosis Date   Anemia    Arthritis    hands, knees, cervical area. Back pain. Rheumatoid arthritis- weekly injections.   BPH (benign prostatic hypertrophy)    Cancer (HCC)    Diabetes (HCC)    Essential tremor    GAD (generalized anxiety disorder) 12/05/2021   GERD (gastroesophageal reflux disease)    reports for indigestion he uses mustard    Hearing deficit    wears hearing aids bilateral   HTN (hypertension)    Obesity    OSA (obstructive sleep apnea)    PAF (paroxysmal atrial fibrillation) (HCC)    Refusal of blood transfusions as patient is Jehovah's Witness    Rheumatoid arthritis ([place])    Right bundle branch block    history of   Seizures ([place])    AS A CHILD. only esential tremors now.   Sleep apnea    cpap - settings at 9 per patient    Ulcer    Past Surgical History:  Past Surgical History:  Procedure Laterality Date   ABDOMINAL EXPOSURE N/A 07/30/2022   Procedure: ABDOMINAL EXPOSURE;  Surgeon: Marty Heck, MD;  Location: [facility];  Service: Vascular;  Laterality: N/A;   ANTERIOR CERVICAL DECOMP/DISCECTOMY FUSION N/A 03/30/2013   Procedure: ANTERIOR CERVICAL DECOMPRESSION/DISCECTOMY FUSION 2 LEVELS;  Surgeon: Otilio Connors, MD;  Location: [facility];  Service: Neurosurgery;  Laterality: N/A;  C4-5 C5-6 Anterior cervical decompression/diskectomy/fusion/Allograft/Plate   ANTERIOR LUMBAR FUSION N/A  07/30/2022   Procedure: Anterior Lumbar  Interbody Fusion  - Lumbar five-sacral one posterior augmentation with iliac screws and O- arm;  Surgeon: Kary Kos, MD;  Location: [facility];  Service: Neurosurgery;  Laterality: N/A;   CHOLECYSTECTOMY N/A 12/07/2013   Procedure: LAPAROSCOPIC CHOLECYSTECTOMY WITH INTRAOPERATIVE CHOLANGIOGRAM;  Surgeon: Adin Hector, MD;  [facility]: [facility];  Service: General;  Laterality: N/A;   COLONOSCOPY     DECOMPRESSIVE LUMBAR LAMINECTOMY LEVEL 2 N/A 02/15/2015   Procedure: COMPLETE DECOMPRESSIVE LUMBAR LAMINECTOMY L4-L5/ FORAMINOTOMY TO L4 NERVE ROOT AND L5 NERVE ROOT BILATERALLY;  Surgeon: [name], MD;  [facility];  Service: Orthopedics;  Laterality: N/A;   EYE SURGERY     right, growth excision   LAMINECTOMY WITH POSTERIOR LATERAL ARTHRODESIS LEVEL 1 N/A 07/30/2022   Procedure: LAMINECTOMY WITH POSTERIOR LATERAL ARTHRODESIS LUMBAR FIVE SACRAL ONE;  Surgeon: Kary Kos, MD;  Location: [facility];  Service: Neurosurgery;  Laterality: N/A;   LUMBAR LAMINECTOMY/DECOMPRESSION MICRODISCECTOMY Left 01/18/2016   Procedure:  DECOMPRESSION L4-L5 MICRODISCECTOMY L4-L5 ON LEFT FOR SPINAL STENOSIS;  Surgeon: [name], MD;  Location[facility];  Service: Orthopedics;  Laterality: Left;   PAROTIDECTOMY Left 01/24/2018   Procedure: INCISIONAL BIOPSY OF LEFT PAROTID    MASS;  Surgeon: Helayne Seminole, MD;  Location: [facility];  Service: ENT;  Laterality: Left;   SPINE SURGERY     TONSILLECTOMY     VASECTOMY     WRIST GANGLION EXCISION Left     Assessment &  Plan Clinical Impression:   Pt is a 76 year old male s/p L5-S1 ALIF 07/30/22; PMH significant for stage IIb marginal zone lymphoma, rheumatoid arthritis, anemia, CAD with mild ischemia (04/2020), paroxysmal atrial flutter, h/o post-op PE-currently on Pradaxa, pulmonary hypertension   Patient currently requires min-MAX with basic self-care skills secondary to muscle weakness, decreased cardiorespiratoy endurance, impaired  timing and sequencing, unbalanced muscle activation, and decreased coordination, decreased awareness, decreased safety awareness, and decreased memory, and decreased standing balance, decreased postural control, decreased balance strategies, and difficulty maintaining precautions.  Prior to hospitalization, patient could complete BADL/simple IADL with modified independent .  Patient will benefit from skilled intervention to decrease level of assist with basic self-care skills and increase independence with basic self-care skills prior to discharge home with care partner.  Anticipate patient will require 24 hour supervision and follow up home health.  OT - End of Session Activity Tolerance: Tolerates 30+ min activity with multiple rests OT Assessment Rehab Potential (ACUTE ONLY): Good OT Barriers to Discharge: Home environment access/layout OT Patient demonstrates impairments in the following area(s): Balance;Cognition;Endurance;Motor;Pain;Safety;Skin Integrity OT Basic ADL's Functional Problem(s): Grooming;Bathing;Dressing;Toileting OT Advanced ADL's Functional Problem(s): Simple Meal Preparation OT Transfers Functional Problem(s): Toilet;Tub/Shower OT Plan OT Intensity: Minimum of 1-2 x/day, 45 to 90 minutes OT Frequency: 5 out of 7 days OT Duration/Estimated Length of Stay: 7- 10 days OT Treatment/Interventions: Balance/vestibular training;DME/adaptive equipment instruction;Patient/family education;Therapeutic Activities;Wheelchair propulsion/positioning;Therapeutic Exercise;Cognitive remediation/compensation;Psychosocial support;UE/LE Strength taining/ROM;Self Care/advanced ADL retraining;Functional mobility training;Community reintegration;Discharge planning;Neuromuscular re-education;Skin care/wound managment;UE/LE Coordination activities;Visual/perceptual remediation/compensation;Splinting/orthotics;Pain management;Disease mangement/prevention OT Self Feeding Anticipated Outcome(s): S OT  Basic Self-Care Anticipated Outcome(s): S OT Toileting Anticipated Outcome(s): S OT Bathroom Transfers Anticipated Outcome(s): S OT Recommendation Patient destination: Home Follow Up Recommendations: Home health OT Equipment Recommended: To be determined;Tub/shower bench   OT Evaluation Precautions/Restrictions  Precautions Precautions: Back;Fall Precaution Booklet Issued: Yes (comment) Precaution Comments: no brace needed per orders Restrictions Weight Bearing Restrictions: No General Chart Reviewed: Yes Family/Caregiver Present: No Vital Signs Therapy Vitals Pulse Rate: 76 BP: 124/74 Pain Pain Assessment Pain Score: 7  Pain Type: Acute pain Pain Location: Abdomen Home Living/Prior Functioning Home Living Family/patient expects to be discharged to:: Private residence Living Arrangements: Spouse/significant other Available Help at Discharge: Family, Available 24 hours/day Type of Home: House Home Access: Stairs to enter [employer] of Steps: 1 Entrance Stairs-Rails: None Home Layout: One level Bathroom Shower/Tub: Tub/shower unit, Architectural technologist: Standard Bathroom Accessibility: Yes Additional Comments: wife works and can flex her work schedule some. pt reports wife is off friday - sunday. 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 Vision Baseline Vision/History: 1 Wears glasses Ability to See in Adequate Light: 0 Adequate Patient Visual Report: No change from baseline Vision Assessment?: No apparent visual deficits Perception  Perception: Within Functional Limits Praxis Praxis:  Intact Cognition Cognition Overall Cognitive Status: History of cognitive impairments - at baseline Arousal/Alertness: Awake/alert Orientation Level: Person;Place;Situation Person: Oriented Place: Oriented Situation: Oriented Memory: Impaired Memory Impairment: Decreased recall of new information;Decreased short term memory Awareness: Appears intact Brief Interview for Mental Status (BIMS) Repetition of Three Words (First Attempt): 3 Temporal Orientation: Year: Correct Temporal Orientation: Month: Accurate within 5 days Temporal Orientation: Day: Correct Recall: "Sock": No, could  not recall Recall: "Blue": No, could not recall Recall: "Bed": No, could not recall BIMS Summary Score: 9 Sensation Sensation Light Touch: Appears Intact Proprioception: Appears Intact Coordination Gross Motor Movements are Fluid and Coordinated: No Fine Motor Movements are Fluid and Coordinated: Yes Heel Shin Test: RLE- decreased speed and excursion Motor  Motor Motor - Skilled Clinical Observations: RLE weakness>LLE  Trunk/Postural Assessment  Cervical Assessment Cervical Assessment: Exceptions to [facility] Thoracic Assessment Thoracic Assessment: Exceptions to [facility] Lumbar Assessment Lumbar Assessment: Exceptions to [facility] (back precautions) Postural Control Postural Control: Deficits on evaluation  Balance Balance Balance Assessed: Yes Dynamic Sitting Balance Dynamic Sitting - Balance Support: No upper extremity supported Dynamic Sitting - Level of Assistance: 5: Stand by assistance Static Standing Balance Static Standing - Level of Assistance: 4: Min assist Dynamic Standing Balance Dynamic Standing - Balance Support: No upper extremity supported Dynamic Standing - Level of Assistance: 4: Min assist Dynamic Standing - Balance Activities: Lateral lean/weight shifting Dynamic Standing - Comments: 1 inch out of BOS to L/R Extremity/Trunk Assessment RUE Assessment RUE Assessment: Within  Functional Limits LUE Assessment LUE Assessment: Within Functional Limits  Care Tool Care Tool Self Care Eating        Oral Care    Oral Care Assist Level: Minimal Assistance - Patient > 75%    Bathing   Body parts bathed by patient: Right arm;Left arm;Chest;Abdomen;Front perineal area;Right upper leg;Left upper leg;Face Body parts bathed by helper: Buttocks;Right lower leg;Left lower leg   Assist Level: Minimal Assistance - Patient > 75%    Upper Body Dressing(including orthotics)   What is the patient wearing?: Pull over shirt   Assist Level: Set up assist    Lower Body Dressing (excluding footwear)   What is the patient wearing?: Pants;Underwear/pull up Assist for lower body dressing: Maximal Assistance - Patient 25 - 49%    Putting on/Taking off footwear   What is the patient wearing?: Non-skid slipper socks Assist for footwear: Dependent - Patient 0%       Care Tool Toileting Toileting activity   Assist for toileting: Minimal Assistance - Patient > 75%     Care Tool Bed Mobility Roll left and right activity   Roll left and right assist level: Supervision/Verbal cueing    Sit to lying activity   Sit to lying assist level: Minimal Assistance - Patient > 75%    Lying to sitting on side of bed activity   Lying to sitting on side of bed assist level: the ability to move from lying on the back to sitting on the side of the bed with no back support.: Minimal Assistance - Patient > 75%     Care Tool Transfers Sit to stand transfer   Sit to stand assist level: Minimal Assistance - Patient > 75%    Chair/bed transfer   Chair/bed transfer assist level: Minimal Assistance - Patient > 75%     Toilet transfer   Assist Level: Minimal Assistance - Patient > 75%     Care Tool Cognition  Expression of Ideas and Wants Expression of Ideas and Wants: 4. Without difficulty (complex and basic) - expresses complex messages without difficulty and with speech that is clear and  easy to understand  Understanding Verbal and Non-Verbal Content Understanding Verbal and Non-Verbal Content: 4. Understands (complex and basic) - clear comprehension without cues or repetitions   Memory/Recall Ability Memory/Recall Ability : Current season;Staff names and faces;That he or she is in a hospital/hospital unit   Refer to Care  Plan for Long Term Goals  SHORT TERM GOAL WEEK 1   STG=LTG d/t ELOS  Recommendations for other services: None    Skilled Therapeutic Intervention ADL ADL Grooming: Minimal assistance Where Assessed-Grooming: Standing at sink Upper Body Bathing: Setup Where Assessed-Upper Body Bathing: Shower Lower Body Bathing: Maximal assistance Where Assessed-Lower Body Bathing: Shower Upper Body Dressing: Setup Where Assessed-Upper Body Dressing: Sitting at sink Lower Body Dressing: Maximal assistance Where Assessed-Lower Body Dressing: Sitting at sink Toileting: Maximal assistance Where Assessed-Toileting: Bedside Commode;Glass blower/designer: Psychiatric nurse Method: Counselling psychologist: Grab bars;Bedside commode Social research officer, government: Minimal Firefighter Method: Ambulating ADL Comments: Pt completes all funcitonal transfers with RW and MIN A. Occlusives applied to incisions anterior and posterior. Pt completes BADL at assist levels above. continued education on back precautions and implications for ADL provided. Pt recalls use of AE from prior back surgery but requries cuing to implement. Pt would benefit from continued AE training to improve efficiency and independence wiht BADLs and transfers. Pt participates in goal setting and initating discussion of DC plan. Mobility  Bed Mobility Bed Mobility: Rolling Right;Rolling Left;Left Sidelying to Sit Rolling Right: Supervision/verbal cueing Rolling Left: Supervision/Verbal cueing Left Sidelying to Sit: Minimal Assistance - Patient  >75% Transfers Sit to Stand: Contact Guard/Touching assist Stand to Sit: Minimal Assistance - Patient > 75%   Discharge Criteria: Patient will be discharged from OT if patient refuses treatment 3 consecutive times without medical reason, if treatment goals not met, if there is a change in medical status, if patient makes no progress towards goals or if patient is discharged from hospital.  The above assessment, treatment plan, treatment alternatives and goals were discussed and mutually agreed upon: by patient  Tonny Branch 08/04/2022, 12:10 PM

## 2022-08-05 DIAGNOSIS — I10 Essential (primary) hypertension: Secondary | ICD-10-CM | POA: Diagnosis not present

## 2022-08-05 DIAGNOSIS — M5106 Intervertebral disc disorders with myelopathy, lumbar region: Secondary | ICD-10-CM | POA: Diagnosis not present

## 2022-08-05 DIAGNOSIS — D62 Acute posthemorrhagic anemia: Secondary | ICD-10-CM | POA: Diagnosis not present

## 2022-08-05 DIAGNOSIS — I480 Paroxysmal atrial fibrillation: Secondary | ICD-10-CM | POA: Diagnosis not present

## 2022-08-05 NOTE — Progress Notes (Signed)
PROGRESS NOTE   Subjective/Complaints: Had a good day of therapy. Slept fairly well last night. Pain controlled  ROS: Patient denies fever, rash, sore throat, blurred vision, dizziness, nausea, vomiting, diarrhea, cough, shortness of breath or chest pain, joint or  neck pain, headache, or mood change.    Objective:   No results found. No results for input(s): "WBC", "HGB", "HCT", "PLT" in the last 72 hours. Recent Labs    08/04/22 0744  NA 136  K 4.3  CL 102  CO2 23  GLUCOSE 142*  BUN 25*  CREATININE 1.50*  CALCIUM 8.4*    Intake/Output Summary (Last 24 hours) at 08/05/2022 0853 Last data filed at 08/04/2022 1805 Gross per 24 hour  Intake 590 ml  Output --  Net 590 ml        Physical Exam: Vital Signs Blood pressure 123/64, pulse 93, temperature 97.6 F (36.4 C), resp. rate 15, height '5\' 5"'$  (1.651 m), weight 100 kg, SpO2 99 %.  Constitutional: No distress . Vital signs reviewed. HEENT: NCAT, EOMI, oral membranes moist Neck: supple Cardiovascular: RRR without murmur. No JVD    Respiratory/Chest: CTA Bilaterally without wheezes or rales. Normal effort    GI/Abdomen: BS +, non-tender, non-distended Ext: no clubbing, cyanosis, or edema Psych: pleasant and cooperative  Skin: abd/back surgical incisions CDI with honeycomb drs Neuro:  Alert and oriented x 3. Normal insight and awareness. Intact Memory. Normal language and speech. Cranial nerve exam unremarkable. UE 5/5 RLE 2-3/5 prox to 4/5 distally. LLE 3- to 3/5 prox to 4/5 distal. No focal sensory losss Musculoskeletal: LB TTP and with    bed mobility    Assessment/Plan: 1. Functional deficits which require 3+ hours per day of interdisciplinary therapy in a comprehensive inpatient rehab setting. Physiatrist is providing close team supervision and 24 hour management of active medical problems listed below. Physiatrist and rehab team continue to assess barriers  to discharge/monitor patient progress toward functional and medical goals  Care Tool:  Bathing    Body parts bathed by patient: Right arm, Left arm, Chest, Abdomen, Front perineal area, Right upper leg, Left upper leg, Face   Body parts bathed by helper: Buttocks, Right lower leg, Left lower leg     Bathing assist Assist Level: Minimal Assistance - Patient > 75%     Upper Body Dressing/Undressing Upper body dressing   What is the patient wearing?: Pull over shirt    Upper body assist Assist Level: Set up assist    Lower Body Dressing/Undressing Lower body dressing      What is the patient wearing?: Pants, Underwear/pull up     Lower body assist Assist for lower body dressing: Maximal Assistance - Patient 25 - 49%     Toileting Toileting    Toileting assist Assist for toileting: Minimal Assistance - Patient > 75%     Transfers Chair/bed transfer  Transfers assist     Chair/bed transfer assist level: Minimal Assistance - Patient > 75%     Locomotion Ambulation   Ambulation assist      Assist level: Minimal Assistance - Patient > 75% Assistive device: No Device Max distance: 50   Walk 10 feet activity  Assist     Assist level: Minimal Assistance - Patient > 75% Assistive device: No Device   Walk 50 feet activity   Assist    Assist level: Minimal Assistance - Patient > 75% Assistive device: No Device    Walk 150 feet activity   Assist Walk 150 feet activity did not occur: Safety/medical concerns (SOB; fatigue)         Walk 10 feet on uneven surface  activity   Assist Walk 10 feet on uneven surfaces activity did not occur: N/A         Wheelchair     Assist Is the patient using a wheelchair?: Yes Type of Wheelchair: Manual    Wheelchair assist level: Supervision/Verbal cueing Max wheelchair distance: 50    Wheelchair 50 feet with 2 turns activity    Assist        Assist Level: Supervision/Verbal cueing    Wheelchair 150 feet activity     Assist  Wheelchair 150 feet activity did not occur: Safety/medical concerns (fatigue, SOB)       Blood pressure 123/64, pulse 93, temperature 97.6 F (36.4 C), resp. rate 15, height '5\' 5"'$  (1.651 m), weight 100 kg, SpO2 99 %.  Medical Problem List and Plan: 1. Functional deficits secondary to psuedoarthrosis at L5-S1 with hardware loosening s/p ALIF with removal of hardware, and posterior lateral arthrodesis L4-S1 on 9/8             -patient may shower             -ELOS/Goals: 7-10 days, supervision goals  -Continue CIR therapies including PT, OT  2. H/o PE/DVT Antithrombotics: -DVT/anticoagulation:  Mechanical: Sequential compression devices, below knee Bilateral lower extremities             -antiplatelet therapy: N/A 3. Pain Management: Hydrocodone and/or flexeril prn.              -pain seems well controlled 4. Mood/Behavior/Sleep: LCSW to follow-up for evaluation and support.             -antipsychotic agents: N/A 5. Neuropsych/cognition: This patient is capable of making decisions on is own behalf. 6. Skin/Wound Care: Monitor incision for healing. --Routine pressure-relief measures. 7. Fluids/Electrolytes/Nutrition: Monitor I/O. Encourage intake. 8. HTN: Monitor BP TID--continue Toprol XL, lasix and Cardizem.  9/2r presently under control  9. AKI/Hypokalemia: Recheck labs in am.  10. H/o tremors: Continue primidone  11.  Anemia: Baseline Hgb around 10 per records review.  --Monitor H/H. Recheck CBC on 09/25.  12. Pre-diabetes: Hgb A1C-5.8 on 01/2022 13. PAF: Monitor HR TID. Stable on Toprol and Cardizem for rate control.   -HR controlled 14. Marginal B cell lymphoma/RA: Managed with Rituxan      LOS: 2 days A FACE TO FACE EVALUATION WAS PERFORMED  Meredith Staggers 08/05/2022, 8:53 AM

## 2022-08-05 NOTE — Progress Notes (Signed)
I agree with Bryan [name], SN assessment and vital signs.

## 2022-08-06 DIAGNOSIS — M5106 Intervertebral disc disorders with myelopathy, lumbar region: Secondary | ICD-10-CM | POA: Diagnosis not present

## 2022-08-06 MED ORDER — DABIGATRAN ETEXILATE MESYLATE 150 MG PO CAPS
150.0000 mg | ORAL_CAPSULE | Freq: Two times a day (BID) | ORAL | Status: DC
  Administered 2022-08-06 – 2022-08-10 (×8): 150 mg via ORAL
  Filled 2022-08-06 (×9): qty 1

## 2022-08-06 NOTE — Progress Notes (Signed)
Inpatient Rehabilitation Center Individual Statement of Services  Patient Name:  Bryan [name]  Date:  08/06/2022  Welcome to the [facility].  Our goal is to provide you with an individualized program based on your diagnosis and situation, designed to meet your specific needs.  With this comprehensive rehabilitation program, you will be expected to participate in at least 3 hours of rehabilitation therapies Monday-Friday, with modified therapy programming on the weekends.  Your rehabilitation program will include the following services:  Physical Therapy (PT), Occupational Therapy (OT), 24 hour per day rehabilitation nursing, Therapeutic Recreaction (TR), Care Coordinator, Rehabilitation Medicine, Nutrition Services, and Pharmacy Services  Weekly team conferences will be held on Tuesday to discuss your progress.  Your Inpatient Rehabilitation Care Coordinator will talk with you frequently to get your input and to update you on team discussions.  Team conferences with you and your family in attendance may also be held.  Expected length of stay: 7-10 days  Overall anticipated outcome: supervision with cues  Depending on your progress and recovery, your program may change. Your Inpatient Rehabilitation Care Coordinator will coordinate services and will keep you informed of any changes. Your Inpatient Rehabilitation Care Coordinator's name and contact numbers are listed  below.  The following services may also be recommended but are not provided by the [facility]:   [place] will be made to provide these services after discharge if needed.  Arrangements include referral to agencies that provide these services.  Your insurance has been verified to be: [place] and [employer]  Your primary doctor is:  [facility] [place]  Pertinent information will be shared with your doctor and your  insurance company.  Inpatient Rehabilitation Care Coordinator:  Ovidio Kin, [place] or Emilia Beck  Information discussed with and copy given to patient by: Elease Hashimoto, 08/06/2022, 11:44 AM

## 2022-08-06 NOTE — Progress Notes (Signed)
Physical Therapy Session Note  Patient Details  Name: Bryan [name] MRN: [id number] Date of Birth: [birth year]  Today's Date: 08/06/2022 PT Individual Time: 0800-0900 PT Individual Time Calculation (min): 60 min   Short Term Goals: Week 1:  PT Short Term Goal 1 (Week 1): = LTGs due to ELOS  Skilled Therapeutic Interventions/Progress Updates:      Therapy Documentation Precautions:  Precautions Precautions: Back, Fall Precaution Booklet Issued: Yes (comment) Precaution Comments: no brace needed per orders Restrictions Weight Bearing Restrictions: No  Pt received semi-reclined in bed, agreeable to PT session. Pt reports unrated incisional back pain that is "manageable". Pt requires CGA with bed mobility and requires verbal and tactile cues for log rolling technique. Pt SBA with sit to stand and stand pivot transfer to w/c with RW and requires verbal cues for hand placement with transfer. Pt propelled w/c ~200 ft close (S) to main gym and requires increased time due to decreased UE activity tolerance. Pt participated in gait training and ambulated ~330 ft with RW and SBA with verbal cueing for AD management. Pt heavily relies on UE with gait and PT instructed pt to depress shoulders and decrease grip on walker to facilitate greater LE activation. Pt participated in stair negotiation x 4 with 2 HR's and required CGA. Pt progressed to stair navigation x 4 with 1 HR and required min A for safety.  Pt propelled w/c ~30 feet and transported remaining distance by w/c to room for energy conservation and left seated in recliner with alarm on and all needs within reach.   Therapy/Group: Individual Therapy  Verl Dicker Verl Dicker PT, DPT  08/06/2022, 7:32 AM

## 2022-08-06 NOTE — Progress Notes (Signed)
Occupational Therapy Session Note  Patient Details  Name: [name] MRN: [id number] Date of Birth: 01-24-1946  Today's Date: 08/06/2022 OT Individual Time: 1000-1100 1st Session; 1415-1530 2nd session  OT Individual Time Calculation (min): 60 min, 75 min    Short Term Goals: Week 1:  OT Short Term Goal 1 (Week 1): STG=LTG d/t ELOS  Skilled Therapeutic Interventions/Progress Updates:  1st Session:  Pt up in w/c eager for skilled Ot session and iopen to all activity including shower retraining. Amb with RW to toilet with grab bars surround for voiding standing level with CGA. Transfer off/on with CGA to tub transfer bench. Required set up UB bathing and dressing self care seated with assist to cover incisions with waterproofing, LB bathing and dressing with AE with min A, demo and increased time. OT applied TED hose due to LE edema. OT amb pt to w/c in front of sink for grooming including shaving and oral care with set up only. Amb then back to recliner and set up with LE's elevated. Pt with min cues to teach back BLT precautions with 7/10 pain at incision with nursing having administered pain meds earlier. Left pt with nurse call button, needs in reach and chair alarm engaged.   2nd Session:  Pt sitting up still in recliner from before lunch reporting mild stiffness. Requested to use toilet for BM and voiding (see flowsheets). Pt sit to/from standing with CGA, amb to and from toilet with RW with CGA. Transfer off and on regular toilet with grab bars with CGA. Min A wiping post BM. CGA LB clothing management,. Close S standing to wash hands. OT transported to ortho gym for time management for 4 min forward and backward on SCIFIT UE restorator on L1 with rest in between. Once back in room, OT assised to bed from w/c with CGA. Removed TED hose with mod A. Pt reported 7/10 pain at incision with OT applying ice pack to LB with rec for 20 min on with pt able to teach back duration to remove. Left pt  with nurse call button, needs in reach and chair alarm engaged.   Therapy Documentation Precautions:  Precautions Precautions: Back, Fall Precaution Booklet Issued: Yes (comment) Precaution Comments: no brace needed per orders Restrictions Weight Bearing Restrictions: No    Therapy/Group: Individual Therapy  Bryan [name] 08/06/2022, 7:59 AM

## 2022-08-06 NOTE — Progress Notes (Signed)
Inpatient Rehabilitation Care Coordinator Assessment and Plan Patient Details  Name: Bryan [name] MRN: [id number] Date of Birth: 18-May-1946  Today's Date: 08/06/2022  Hospital Problems: Principal Problem:   Lumbar disc disorder with myelopathy  Past Medical History:  Past Medical History:  Diagnosis Date   Anemia    Arthritis    hands, knees, cervical area. Back pain. Rheumatoid arthritis- weekly injections.   BPH (benign prostatic hypertrophy)    Cancer (HCC)    Diabetes (HCC)    Essential tremor    GAD (generalized anxiety disorder) 12/05/2021   GERD (gastroesophageal reflux disease)    reports for indigestion he uses mustard    Hearing deficit    wears hearing aids bilateral   HTN (hypertension)    Obesity    OSA (obstructive sleep apnea)    PAF (paroxysmal atrial fibrillation) (HCC)    Refusal of blood transfusions as patient is Jehovah's Witness    Rheumatoid arthritis ([place])    Right bundle branch block    history of   Seizures ([place])    AS A CHILD. only esential tremors now.   Sleep apnea    cpap - settings at 9 per patient    Ulcer    Past Surgical History:  Past Surgical History:  Procedure Laterality Date   ABDOMINAL EXPOSURE N/A 07/30/2022   Procedure: ABDOMINAL EXPOSURE;  Surgeon: Marty Heck, MD;  Location: [place];  Service: Vascular;  Laterality: N/A;   ANTERIOR CERVICAL DECOMP/DISCECTOMY FUSION N/A 03/30/2013   Procedure: ANTERIOR CERVICAL DECOMPRESSION/DISCECTOMY FUSION 2 LEVELS;  Surgeon: Otilio Connors, MD;  Location: [place] [facility];  Service: Neurosurgery;  Laterality: N/A;  C4-5 C5-6 Anterior cervical decompression/diskectomy/fusion/Allograft/Plate   ANTERIOR LUMBAR FUSION N/A 07/30/2022   Procedure: Anterior Lumbar  Interbody Fusion  - Lumbar five-sacral one posterior augmentation with iliac screws and O- arm;  Surgeon: Kary Kos, MD;  Location: [facility];  Service: Neurosurgery;  Laterality: N/A;   CHOLECYSTECTOMY N/A 12/07/2013   Procedure:  LAPAROSCOPIC CHOLECYSTECTOMY WITH INTRAOPERATIVE CHOLANGIOGRAM;  Surgeon: Adin Hector, MD;  Location: [facility];  Service: General;  Laterality: N/A;   COLONOSCOPY     DECOMPRESSIVE LUMBAR LAMINECTOMY LEVEL 2 N/A 02/15/2015   Procedure: COMPLETE DECOMPRESSIVE LUMBAR LAMINECTOMY L4-L5/ FORAMINOTOMY TO L4 NERVE ROOT AND L5 NERVE ROOT BILATERALLY;  Surgeon: [name], MD;  Location: [facility];  Service: Orthopedics;  Laterality: N/A;   EYE SURGERY     right, growth excision   LAMINECTOMY WITH POSTERIOR LATERAL ARTHRODESIS LEVEL 1 N/A 07/30/2022   Procedure: LAMINECTOMY WITH POSTERIOR LATERAL ARTHRODESIS LUMBAR FIVE SACRAL ONE;  Surgeon: Kary Kos, MD;  Location: [facility];  Service: Neurosurgery;  Laterality: N/A;   LUMBAR LAMINECTOMY/DECOMPRESSION MICRODISCECTOMY Left 01/18/2016   Procedure:  DECOMPRESSION L4-L5 MICRODISCECTOMY L4-L5 ON LEFT FOR SPINAL STENOSIS;  Surgeon: [name], MD;  Location[facility];  Service: Orthopedics;  Laterality: Left;   PAROTIDECTOMY Left 01/24/2018   Procedure: INCISIONAL BIOPSY OF LEFT PAROTID    MASS;  Surgeon: Helayne Seminole, MD;  Location: [facility];  Service: ENT;  Laterality: Left;   SPINE SURGERY     TONSILLECTOMY     VASECTOMY     WRIST GANGLION EXCISION Left    Social History:  reports that he quit smoking about 49 years ago. His smoking use included cigarettes. He has a 7.00 pack-year smoking history. He has never used smokeless tobacco. He reports current alcohol use. He reports that he does not use drugs.  Family / [place] Marital  Status: Married Patient Roles: Spouse, Other (Comment) (sibling) Spouse/Significant Other: [place] [phone]-cell  [phone]-home Other Supports: Donald-friend [phone] Anticipated Caregiver: Wife and friends Ability/Limitations of Caregiver: wife still works four days a week but they have friends who will check on him. Pt reports she will not leave me alone when I go home at first Caregiver Availability: Other  (Comment) (short term 24/7) Family Dynamics: Close with family and friends, has good church support and they will be checking on him at discharge. 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 He does wear B-hearing aides so will include wife if any decisions need to be made while here   Abuse/Neglect Abuse/Neglect Assessment Can Be Completed: Yes Physical Abuse: Denies Verbal Abuse: Denies Sexual Abuse: Denies Exploitation of patient/patient's resources: Denies Self-Neglect: Denies  Patient response to: Social Isolation - How often do you feel lonely or isolated from those around you?: Never  Emotional Status Pt's affect, behavior and adjustment status: Pt is motivated to do well and recover from his back surgery. He was independent prior to this surgery and hopes to get back to this level due to he has other issues to be strong for. He is glad the hard part is over the surgery. Recent Psychosocial Issues: other health issues-lymphoma and RA which at times limit him Psychiatric History: No history seems to be motivated and positive in regards to his recovery. He is able to verbalize his concerns and questions and feels positive regarding his recovery Substance Abuse History: No issues  Patient / Family Perceptions, Expectations & Goals Pt/Family understanding of illness & functional limitations: Pt is able to explain his back surgery and  limitations regarding this. He does talk with the MD when rounding and feels he understands his treatment plan moving forward. Premorbid pt/family roles/activities: husband, church member, brother, friend, Technical sales engineer, veteran, etc Anticipated changes in roles/activities/participation: resume Pt/family expectations/goals: Pt states: " I hope to get to moving rorund on my own before I leave here that is my goal."  [employer]: Other (Comment) [facility] [place] Premorbid Home Care/DME Agencies: None Transportation available at discharge: wife and friends Is the patient able to respond to transportation needs?: Yes In the past 12 months, has lack of transportation kept you from medical appointments or from getting medications?: No In the past 12 months, has lack of transportation kept you from meetings, work, or from getting things needed for daily living?: No  Discharge Planning Living Arrangements: Spouse/significant other Support Systems: Spouse/significant other, Other relatives, Friends/neighbors, Social worker community Type of Residence: Private residence Insurance Resources: Multimedia programmer (specify) ([place] and Psychologist, prison and probation services) Financial Resources: Social Security, Family Support Financial Screen Referred: No Living Expenses: Own Money Management: Patient, Spouse Does the patient have any problems obtaining your medications?: No Home Management: wife Patient/Family Preliminary Plans: Return home with wife who does work four days per week. She will make arrangements for someone to be there with him at first when he is discharged home. Made aware of  team conference tomorrow and will disucss goals and target discharge date with tomorrow. Care Coordinator Barriers to Discharge: Insurance for SNF coverage, Decreased caregiver support Care Coordinator Anticipated Follow Up Needs: HH/OP  Clinical Impression Pleasant gentleman who is motivated to do well and recover  from hs back surgery. His wife does work but will have someone with him. Will depend upon what is needed also. Will work on discharge needs and update after team conference tomorrow.  Elease Hashimoto 08/06/2022, 11:43 AM

## 2022-08-06 NOTE — Progress Notes (Signed)
PROGRESS NOTE   Subjective/Complaints:  Good night; no sweating overnight, which he's been having, but not last night.   Pain doing pretty good.  LBM yesterday  ROS:  Pt denies SOB, abd pain, CP, N/V/C/D, and vision changes   Objective:   No results found. No results for input(s): "WBC", "HGB", "HCT", "PLT" in the last 72 hours. Recent Labs    08/04/22 0744  NA 136  K 4.3  CL 102  CO2 23  GLUCOSE 142*  BUN 25*  CREATININE 1.50*  CALCIUM 8.4*    Intake/Output Summary (Last 24 hours) at 08/06/2022 0854 Last data filed at 08/06/2022 0715 Gross per 24 hour  Intake 716 ml  Output 1175 ml  Net -459 ml        Physical Exam: Vital Signs Blood pressure 115/61, pulse 77, temperature (!) 97.4 F (36.3 C), temperature source Oral, resp. rate 18, height '5\' 5"'$  (1.651 m), weight 100 kg, SpO2 98 %.   General: awake, alert, appropriate, supine in bed; PT in room; NAD HENT: conjugate gaze; oropharynx moist CV: regular rate; no JVD Pulmonary: CTA B/L; no W/R/R- good air movement GI: soft, NT, ND, (+)BS Psychiatric: appropriate Neurological: Ox3  Skin: abd/back surgical incisions CDI with honeycomb drs Neuro:  Alert and oriented x 3. Normal insight and awareness. Intact Memory. Normal language and speech. Cranial nerve exam unremarkable. UE 5/5 RLE 2-3/5 prox to 4/5 distally. LLE 3- to 3/5 prox to 4/5 distal. No focal sensory losss Musculoskeletal: LB TTP and with    bed mobility    Assessment/Plan: 1. Functional deficits which require 3+ hours per day of interdisciplinary therapy in a comprehensive inpatient rehab setting. Physiatrist is providing close team supervision and 24 hour management of active medical problems listed below. Physiatrist and rehab team continue to assess barriers to discharge/monitor patient progress toward functional and medical goals  Care Tool:  Bathing    Body parts bathed by patient:  Right arm, Left arm, Chest, Abdomen, Front perineal area, Right upper leg, Left upper leg, Face   Body parts bathed by helper: Buttocks, Right lower leg, Left lower leg     Bathing assist Assist Level: Minimal Assistance - Patient > 75%     Upper Body Dressing/Undressing Upper body dressing   What is the patient wearing?: Pull over shirt    Upper body assist Assist Level: Set up assist    Lower Body Dressing/Undressing Lower body dressing      What is the patient wearing?: Pants, Underwear/pull up     Lower body assist Assist for lower body dressing: Maximal Assistance - Patient 25 - 49%     Toileting Toileting    Toileting assist Assist for toileting: Minimal Assistance - Patient > 75%     Transfers Chair/bed transfer  Transfers assist     Chair/bed transfer assist level: Minimal Assistance - Patient > 75%     Locomotion Ambulation   Ambulation assist      Assist level: Minimal Assistance - Patient > 75% Assistive device: No Device Max distance: 50   Walk 10 feet activity   Assist     Assist level: Minimal Assistance - Patient > 75%  Assistive device: No Device   Walk 50 feet activity   Assist    Assist level: Minimal Assistance - Patient > 75% Assistive device: No Device    Walk 150 feet activity   Assist Walk 150 feet activity did not occur: Safety/medical concerns (SOB; fatigue)         Walk 10 feet on uneven surface  activity   Assist Walk 10 feet on uneven surfaces activity did not occur: N/A         Wheelchair     Assist Is the patient using a wheelchair?: Yes Type of Wheelchair: Manual    Wheelchair assist level: Supervision/Verbal cueing Max wheelchair distance: 50    Wheelchair 50 feet with 2 turns activity    Assist        Assist Level: Supervision/Verbal cueing   Wheelchair 150 feet activity     Assist  Wheelchair 150 feet activity did not occur: Safety/medical concerns (fatigue,  SOB)       Blood pressure 115/61, pulse 77, temperature (!) 97.4 F (36.3 C), temperature source Oral, resp. rate 18, height '5\' 5"'$  (1.651 m), weight 100 kg, SpO2 98 %.  Medical Problem List and Plan: 1. Functional deficits secondary to psuedoarthrosis at L5-S1 with hardware loosening s/p ALIF with removal of hardware, and posterior lateral arthrodesis L4-S1 on 9/8             -patient may shower             -ELOS/Goals: 7-10 days, supervision goals  Con't CIR PT and OT 2. H/o PE/DVT Antithrombotics: -DVT/anticoagulation:  Mechanical: Sequential compression devices, below knee Bilateral lower extremities             -antiplatelet therapy: N/A 3. Pain Management: Hydrocodone and/or flexeril prn.              -9/25- con't pain regimen- well controlled 4. Mood/Behavior/Sleep: LCSW to follow-up for evaluation and support.             -antipsychotic agents: N/A 5. Neuropsych/cognition: This patient is capable of making decisions on is own behalf. 6. Skin/Wound Care: Monitor incision for healing. --Routine pressure-relief measures. 7. Fluids/Electrolytes/Nutrition: Monitor I/O. Encourage intake. 8. HTN: Monitor BP TID--continue Toprol XL, lasix and Cardizem.  9/25 presently under control - con't regimen 9. AKI/Hypokalemia: Recheck labs in am.   9/25- last Cr 1.50- down from 1.67- will recheck in AM and push fluids 10. H/o tremors: Continue primidone  11.  Anemia: Baseline Hgb around 10 per records review.  --Monitor H/H. Recheck CBC on 09/25.  9/25- will recheck in AM 12. Pre-diabetes: Hgb A1C-5.8 on 01/2022 13. PAF: Monitor HR TID. Stable on Toprol and Cardizem for rate control.   -HR controlled 14. Marginal B cell lymphoma/RA: Managed with Rituxan      I spent a total of  35  minutes on total care today- >50% coordination of care- due to [facility] and review of chart and d/w pt about sweating issues. Also the AKI.       LOS: 3 days A FACE TO FACE EVALUATION WAS PERFORMED  Yago Ludvigsen 08/06/2022, 8:54 AM

## 2022-08-06 NOTE — Progress Notes (Signed)
Inpatient Rehabilitation  Patient information reviewed and entered into eRehab system by Krystalyn Kubota M. Vayla Wilhelmi, M.A., CCC/SLP, PPS Coordinator.  Information including medical coding, functional ability and quality indicators will be reviewed and updated through discharge.    

## 2022-08-06 NOTE — IPOC Note (Signed)
Overall Plan of Care [facility]) Patient Details Name: Bryan [name] MRN: [id number] DOB: 05-28-1946  Admitting Diagnosis: Lumbar disc disorder with myelopathy  Hospital Problems: Principal Problem:   Lumbar disc disorder with myelopathy     Functional Problem List: Nursing Bladder, Bowel, Medication Management, Safety, Pain, Endurance  PT Balance, Pain, Endurance, Motor  OT Balance, Cognition, Endurance, Motor, Pain, Safety, Skin Integrity  SLP    TR         Basic ADL's: OT Grooming, Bathing, Dressing, Toileting     Advanced  ADL's: OT Simple Meal Preparation     Transfers: PT Bed Mobility, Bed to Chair, Car, Manufacturing systems engineer, Metallurgist: PT Ambulation, Stairs, Emergency planning/management officer     Additional Impairments: OT    SLP        TR      Anticipated Outcomes Item Anticipated Outcome  Self Feeding S  Swallowing      Basic self-care  S  Toileting  S   Bathroom Transfers S  Bowel/Bladder  manage bowel and bladder w mod I  Transfers  S for basic, car, furniture  Locomotion  S wiht LRAD for gait x 50' household setting, x 150' controlled setting, x 200' community setting including slightly uneven terrain, up/down ( 1 )4" high curb/step  Communication     Cognition     Pain  < 4 with prns  Safety/Judgment  manage w cues   Therapy Plan: PT Intensity: Minimum of 1-2 x/day ,45 to 90 minutes PT Frequency: 5 out of 7 days PT Duration Estimated Length of Stay: 7 days OT Intensity: Minimum of 1-2 x/day, 45 to 90 minutes OT Frequency: 5 out of 7 days OT Duration/Estimated Length of Stay: 7- 10 days     Team Interventions: Nursing Interventions Disease Management/Prevention, Bladder Management, Medication Management, Discharge Planning, Bowel Management, Patient/Family Education, Pain Management  PT interventions Ambulation/gait training, Cognitive remediation/compensation, Discharge planning, DME/adaptive equipment instruction, Functional  mobility training, Pain management, Psychosocial support, Splinting/orthotics, Therapeutic Activities, UE/LE Strength taining/ROM, Training and development officer, Community reintegration, Neuromuscular re-education, Barrister's clerk education, IT trainer, Therapeutic Exercise, UE/LE Coordination activities, Wheelchair propulsion/positioning  OT Interventions Training and development officer, Engineer, drilling, Patient/family education, Therapeutic Activities, Wheelchair propulsion/positioning, Therapeutic Exercise, Cognitive remediation/compensation, Psychosocial support, UE/LE Strength taining/ROM, Self Care/advanced ADL retraining, Functional mobility training, Community reintegration, Discharge planning, Neuromuscular re-education, Skin care/wound managment, UE/LE Coordination activities, Visual/perceptual remediation/compensation, Splinting/orthotics, Pain management, Disease mangement/prevention  SLP Interventions    TR Interventions    SW/CM Interventions Discharge Planning, Psychosocial Support, Patient/Family Education   Barriers to Discharge MD  Medical stability, Home enviroment access/loayout, Wound care, Lack of/limited family support, Weight, and Weight bearing restrictions  Nursing Decreased caregiver support, Home environment access/layout 1 level 1 ste w spouse; friend to assist  PT      OT Home environment access/layout    SLP      SW       Team Discharge Planning: Destination: PT-Home ,OT- Home , SLP-  Projected Follow-up: PT-Home health PT, OT-  Home health OT, SLP-  Projected Equipment Needs: PT-Rolling walker with 5" wheels, OT- To be determined, Tub/shower bench, SLP-  Equipment Details: PT-likely will need RW, OT-  Patient/family involved in discharge planning: PT- Patient,  OT-Patient, SLP-   MD ELOS: 7-10 days Medical Rehab Prognosis:  Good Assessment: The patient has been admitted for CIR therapies with the diagnosis of lumbar myelopathy. The team will  be addressing functional mobility, strength, stamina, balance, safety, adaptive techniques and  equipment, self-care, bowel and bladder mgt, patient and caregiver education, . Goals have been set at supervision. Anticipated discharge destination is home.        See Team Conference Notes for weekly updates to the plan of care

## 2022-08-07 DIAGNOSIS — M5106 Intervertebral disc disorders with myelopathy, lumbar region: Secondary | ICD-10-CM | POA: Diagnosis not present

## 2022-08-07 LAB — CBC WITH DIFFERENTIAL/PLATELET
Abs Immature Granulocytes: 0.02 10*3/uL (ref 0.00–0.07)
Basophils Absolute: 0 10*3/uL (ref 0.0–0.1)
Basophils Relative: 0 %
Eosinophils Absolute: 0.1 10*3/uL (ref 0.0–0.5)
Eosinophils Relative: 3 %
HCT: 23.9 % — ABNORMAL LOW (ref 39.0–52.0)
Hemoglobin: 7.8 g/dL — ABNORMAL LOW (ref 13.0–17.0)
Immature Granulocytes: 0 %
Lymphocytes Relative: 20 %
Lymphs Abs: 1 10*3/uL (ref 0.7–4.0)
MCH: 31.2 pg (ref 26.0–34.0)
MCHC: 32.6 g/dL (ref 30.0–36.0)
MCV: 95.6 fL (ref 80.0–100.0)
Monocytes Absolute: 0.5 10*3/uL (ref 0.1–1.0)
Monocytes Relative: 10 %
Neutro Abs: 3.5 10*3/uL (ref 1.7–7.7)
Neutrophils Relative %: 67 %
Platelets: 296 10*3/uL (ref 150–400)
RBC: 2.5 MIL/uL — ABNORMAL LOW (ref 4.22–5.81)
RDW: 14.7 % (ref 11.5–15.5)
WBC: 5.2 10*3/uL (ref 4.0–10.5)
nRBC: 0 % (ref 0.0–0.2)

## 2022-08-07 LAB — BASIC METABOLIC PANEL
Anion gap: 7 (ref 5–15)
BUN: 25 mg/dL — ABNORMAL HIGH (ref 8–23)
CO2: 24 mmol/L (ref 22–32)
Calcium: 8.5 mg/dL — ABNORMAL LOW (ref 8.9–10.3)
Chloride: 106 mmol/L (ref 98–111)
Creatinine, Ser: 1.37 mg/dL — ABNORMAL HIGH (ref 0.61–1.24)
GFR, Estimated: 53 mL/min — ABNORMAL LOW (ref >60)
Glucose, Bld: 91 mg/dL (ref 70–99)
Potassium: 3.9 mmol/L (ref 3.5–5.1)
Sodium: 137 mmol/L (ref 135–145)

## 2022-08-07 NOTE — Progress Notes (Addendum)
Occupational Therapy Session Note  Patient Details  Name: Bryan [name] MRN: [id number] Date of Birth: Jan 15, 1946  Today's Date: 08/07/2022 OT Individual Time: 1030-1130 OT Individual Time Calculation (min): 60 min    Short Term Goals: Week 1:  OT Short Term Goal 1 (Week 1): STG=LTG d/t ELOS  Skilled Therapeutic Interventions/Progress Updates:  Pt resting with LE's up in recliner upon OT arrival. Pt open to all training. OT assisted pt to access demo tub shower room via w/c for time management. SLS transfer in and out of tub shower with use of 1 suction grab bar and shower seat on inside as pt is requesting shower seat vs tub transfer bench. Pt able to complete with close S. OT care coord with MSW for shower bench order and pt is to purchase suction grab bar on their own. Family Educ this Thursday 9/28 at 1 pm and pt's discharge date is Fri 9/29 with Grad Day 9/28 however primary OT is off W-F therefore treatment sticky note updated. Once back in room, pt amb with S into bathroom to stand at toilet to void and then close s to amb to shower seat. Reacher use and LHS for doffing pants, assist for TED hose, assist to cover incisions. Pt able to use LHS with S for LB bathing as well as grab bar for intermittent standing for buttocks. 47 S for UB bathing. Amb to sink side w/c set up with close S. UB dressing and grooming w/c level with indep level, set up and cues for AE for LB pull on pants and slipper socks but total assist for TEDS. Pt then amb back to recliner with S only and left in recliner with LE's elevated, chair exit alarm set and needs and nurse call button in reach.   Therapy Documentation Precautions:  Precautions Precautions: Back, Fall Precaution Booklet Issued: Yes (comment) Precaution Comments: no brace needed per orders Restrictions Weight Bearing Restrictions: No    Therapy/Group: Individual Therapy  Barnabas Lister 08/07/2022, 7:49 AM

## 2022-08-07 NOTE — Progress Notes (Signed)
Patient ID: Bryan [name], male   DOB: 03-25-1946, 76 y.o.   MRN: [id number]  Met with pt and he called wife and she was on speaker phone to update both regarding team conference supervision level goals and discharge date 9/29. Have sent orders for rolling walker and tub seat [facility] and also for HHPT and OT. Will need to see if can get rolling walker in time for home Friday since he needs to get into the home and move around. Have scheduled wife to be here Thursday at 1:00 for hands on education will let team know. Will work on discharge needs. Wife is aware of husband need for 24/7 supervision and has folks lined up to provide this.

## 2022-08-07 NOTE — Progress Notes (Addendum)
PROGRESS NOTE   Subjective/Complaints:  Pt reports no issues- doing "great' with therapy- wants to go home as soon as possible.   ROS:   Pt denies SOB, abd pain, CP, N/V/C/D, and vision changes    Objective:   No results found. Recent Labs    08/07/22 0537  WBC 5.2  HGB 7.8*  HCT 23.9*  PLT 296   Recent Labs    08/07/22 0537  NA 137  K 3.9  CL 106  CO2 24  GLUCOSE 91  BUN 25*  CREATININE 1.37*  CALCIUM 8.5*    Intake/Output Summary (Last 24 hours) at 08/07/2022 0928 Last data filed at 08/07/2022 0726 Gross per 24 hour  Intake 830 ml  Output 1550 ml  Net -720 ml        Physical Exam: Vital Signs Blood pressure (!) 117/92, pulse 86, temperature 98.5 F (36.9 C), temperature source Oral, resp. rate 16, height '5\' 5"'$  (1.651 m), weight 100 kg, SpO2 97 %.    General: awake, alert, appropriate, NAD HENT: conjugate gaze; oropharynx moist CV: regular rate; no JVD Pulmonary: CTA B/L; no W/R/R- good air movement GI: soft, NT, ND, (+)BS Psychiatric: appropriate Neurological: Ox3  Skin: abd/back surgical incisions CDI with [facility] Neuro:  Alert and oriented x 3. Normal insight and awareness. Intact Memory. Normal language and speech. Cranial nerve exam unremarkable. UE 5/5 RLE 2-3/5 prox to 4/5 distally. LLE 3- to 3/5 prox to 4/5 distal. No focal sensory losss Musculoskeletal: LB TTP and with    bed mobility    Assessment/Plan: 1. Functional deficits which require 3+ hours per day of interdisciplinary therapy in a comprehensive inpatient rehab setting. Physiatrist is providing close team supervision and 24 hour management of active medical problems listed below. Physiatrist and rehab team continue to assess barriers to discharge/monitor patient progress toward functional and medical goals  Care Tool:  Bathing    Body parts bathed by patient: Right arm, Left arm, Chest, Abdomen, Front perineal  area, Right upper leg, Left upper leg, Face   Body parts bathed by helper: Buttocks, Right lower leg, Left lower leg     Bathing assist Assist Level: Minimal Assistance - Patient > 75%     Upper Body Dressing/Undressing Upper body dressing   What is the patient wearing?: Pull over shirt    Upper body assist Assist Level: Set up assist    Lower Body Dressing/Undressing Lower body dressing      What is the patient wearing?: Pants, Underwear/pull up     Lower body assist Assist for lower body dressing: Maximal Assistance - Patient 25 - 49%     Toileting Toileting    Toileting assist Assist for toileting: 2 Helpers     Transfers Chair/bed transfer  Transfers assist     Chair/bed transfer assist level: Contact Guard/Touching assist     Locomotion Ambulation   Ambulation assist      Assist level: Contact Guard/Touching assist Assistive device: Walker-rolling Max distance: 330 feet   Walk 10 feet activity   Assist     Assist level: Contact Guard/Touching assist Assistive device: Walker-rolling   Walk 50 feet activity   Assist  Assist level: Contact Guard/Touching assist Assistive device: Walker-rolling    Walk 150 feet activity   Assist Walk 150 feet activity did not occur: Safety/medical concerns (SOB; fatigue)  Assist level: Contact Guard/Touching assist Assistive device: Walker-rolling    Walk 10 feet on uneven surface  activity   Assist Walk 10 feet on uneven surfaces activity did not occur: Safety/medical concerns         Wheelchair     Assist Is the patient using a wheelchair?: Yes Type of Wheelchair: Manual    Wheelchair assist level: Supervision/Verbal cueing Max wheelchair distance: 200 feet    Wheelchair 50 feet with 2 turns activity    Assist        Assist Level: Supervision/Verbal cueing   Wheelchair 150 feet activity     Assist  Wheelchair 150 feet activity did not occur:  (fatigue,  SOB)   Assist Level: Supervision/Verbal cueing   Blood pressure (!) 117/92, pulse 86, temperature 98.5 F (36.9 C), temperature source Oral, resp. rate 16, height '5\' 5"'$  (1.651 m), weight 100 kg, SpO2 97 %.  Medical Problem List and Plan: 1. Functional deficits secondary to psuedoarthrosis at L5-S1 with hardware loosening s/p ALIF with removal of hardware, and posterior lateral arthrodesis L4-S1 on 9/8             -patient may shower             -ELOS/Goals: 7-10 days, supervision goals  Con't CIR- PT and OT Team conference today to determine length of stay 2. H/o PE/DVT Antithrombotics: -DVT/anticoagulation:  Mechanical: Sequential compression devices, below knee Bilateral lower extremities 9/26- will see how far pt is walking, if needs DVT prophylaxis- restarted Pradaxa             -antiplatelet therapy: N/A 3. Pain Management: Hydrocodone and/or flexeril prn.              -9/25- con't pain regimen- well controlled 4. Mood/Behavior/Sleep: LCSW to follow-up for evaluation and support.             -antipsychotic agents: N/A 5. Neuropsych/cognition: This patient is capable of making decisions on is own behalf. 6. Skin/Wound Care: Monitor incision for healing. --Routine pressure-relief measures. 7. Fluids/Electrolytes/Nutrition: Monitor I/O. Encourage intake. 8. HTN: Monitor BP TID--continue Toprol XL, lasix and Cardizem.  9/25 presently under control - con't regimen 9. AKI/Hypokalemia: Recheck labs in am.   9/25- last Cr 1.50- down from 1.67- will recheck in AM and push fluids  9/26- Cr down to 1.37 and BUN 25- will push fluids- since getting better, won't do IVFs.  10. H/o tremors: Continue primidone  11.  Anemia: Baseline Hgb around 10 per records review.  --Monitor H/H. Recheck CBC on 09/25.  9/25- will recheck in AM 12. Pre-diabetes: Hgb A1C-5.8 on 01/2022 13. PAF: Monitor HR TID. Stable on Toprol and Cardizem for rate control.   -HR controlled 14. Marginal B cell lymphoma/RA:  Managed with Rituxan      I spent a total of 35   minutes on total care today- >50% coordination of care- due to team conference and discussing with pt about dispo/d/c.      LOS: 4 days A FACE TO FACE EVALUATION WAS PERFORMED  Bryan [name] 08/07/2022, 9:28 AM

## 2022-08-07 NOTE — Progress Notes (Signed)
Physical Therapy Session Note  Patient Details  Name: Bryan [name] MRN: [id number] Date of Birth: [birth year]  Today's Date: 08/07/2022 PT Individual Time: 0800-0916 PT Individual Time Calculation (min): 76 min & 25 min PT Missed Minutes: 50 min due to pain    Short Term Goals: Week 1:  PT Short Term Goal 1 (Week 1): = LTGs due to ELOS  Skilled Therapeutic Interventions/Progress Updates:      Therapy Documentation Precautions:  Precautions Precautions: Back, Fall Precaution Booklet Issued: Yes (comment) Precaution Comments: no brace needed per orders Restrictions Weight Bearing Restrictions: No   Treatment Session 1:  Pt received semi-reclined in bed, agreeable to PT session. Pt reports 3/10 incisional back pain and pt pre-medicated. PT dependently donned ted hose for edema management. Pt (S) with bed mobility and sit to stand for lower body dressing. PT provided total A for donning pants for time management. Pt ambulated ~200 ft to main gym SBA with verbal cues for posture and to decreased UE support on AD. Pt CGA/min A for dynamic standing balance with 3 inch step taps 2 x 5 bilaterally. Initially pt utilized bilateral UE support on RW and progressed to unilateral support on AD. Pt required minimal A for stair negotiation x 4 with SPC and no rails. Pt reports spouse is unable to provide physical assist upon d/c for stair training and PT recommended pt to install 2 HR's for pt to safely enter residence. Pt in agreement and reports he will make calls for installation. Pt ambulated to dayroom (S) with RW with verbal cues for AD management and pt utilized kinetron 2 x 2 min at 20 cm/sec for LE strengthening and activity tolerance. Pt ambulated to room and left in recliner with all needs in reach.   Treatment Session 2:  Pt received seated in recliner, agreeable to PT session. Pt (S) with sit to stand and ambulation ~50 ft with RW. Pt reports increased incisional and low back pain "15/10"  and ambulated with PT to nursing station. Nurse notified and prepared pain medication while pt continued to ambulate as he reported pain decreased with activity. Pt ambulated ~200 ft with RW (S) and started to have increased pain and back spasms. Pt returned to room via w/c and nurse administered pain medication. Pt left semi-reclined in bed with alarm on and all needs within reach.    Therapy/Group: Individual Therapy  Verl Dicker Verl Dicker PT, DPT  08/07/2022, 7:36 AM

## 2022-08-07 NOTE — Patient Care Conference (Signed)
Inpatient RehabilitationTeam Conference and Plan of Care Update Date: 08/07/2022   Time: 11:36 AM    Patient Name: JAMILL WETMORE      Medical Record Number: [id number]  Date of Birth: 07-Jan-1946 Sex: Male         Room/Bed: 4W09C/4W09C-01 Payor Info: Payor: [employer] / Plan: [place] / Product Type: *No Product type* /    Admit Date/Time:  08/03/2022  3:12 PM  Primary Diagnosis:  Lumbar disc disorder with myelopathy  Hospital Problems: Principal Problem:   Lumbar disc disorder with myelopathy    Expected Discharge Date: Expected Discharge Date: 08/10/22  Team Members Present: Physician leading conference: Dr. Courtney Heys Social Worker Present: Ovidio Kin, LCSW Nurse Present: Other (comment) Tacy Learn, RN) PT Present: Other (comment) Verl Dicker, PT) OT Present: Jamey Ripa, OT PPS Coordinator present : Gunnar Fusi, SLP     Current Status/Progress Goal Weekly Team Focus  Bowel/Bladder   cont of B/B. Last BM- 08/06/22  remain cont.  assess q shift and pRN   Swallow/Nutrition/ Hydration             ADL's   set up UB self care seated, CGA standing for urination, toilet and tub shower transfer with CGA, LB bathing and dressing with AE min A  s  progress to S for ADL's with AE and DME   Mobility   CGA bed mobility, SBA transfers RW + gait (RW) 330 ft, Min A steps x 8 with 1 HR, w/c propulsion (S) 200 ft  Supervision  transfer, gait, stairs, activity tolerance, strength   Communication             Safety/Cognition/ Behavioral Observations            Pain   c/o 8 of 10 back pain. Pain management in progress, effective  pain<3  Assess pain q shift and pRN   Skin   sx incision to abdomen lower left, lower back. Dressing in place, intact.  proper healing  assess skin q shift and PRN     Discharge Planning:  Home with wife who does work but can make arrangements for someone to be with him at DC. Will confirm plan-awaiting  wife's return call   Team Discussion: Lumbar disc disorder with myelopathy. Continent B/B LBM 09/26. Pain managed with PRNs. Incision to back and ABD is intact without drainage. Pradaxa. Rails to be installed.  Patient on target to meet rehab goals: yes, ambulating 330 feet  *See Care Plan and progress notes for long and short-term goals.   Revisions to Treatment Plan:  NA  Teaching Needs: Medications, safety, skin/wound care, gait/transfer training, etc  Current Barriers to Discharge: Decreased caregiver support, Home enviroment access/layout, Wound care, Weight, and Weight bearing restrictions  Possible Resolutions to Barriers: Family education, medication education, skin/wound care education, order recommended DME     Medical Summary Current Status: incisions- no drainage- no othe rskin issues- is continent; pain controlled  Barriers to Discharge: Wound care;Weight bearing restrictions;Weight;Decreased family/caregiver support;Home enviroment access/layout;Medical stability  Barriers to Discharge Comments: wife works 4 days/week Possible Resolutions to [employer] Focus: CGA currently- 369f with RW- stairs really difficult- needs rails-LB CGA- -needs shower seat, not tub trnasfer bench- needs RW- d/c 9/29- needs family training-   Continued Need for Acute Rehabilitation Level of Care: The patient requires daily medical management by a physician with specialized training in physical medicine and rehabilitation for the following reasons: Direction of a multidisciplinary physical rehabilitation program to  maximize functional independence : Yes Medical management of patient stability for increased activity during participation in an intensive rehabilitation regime.: Yes Analysis of laboratory values and/or radiology reports with any subsequent need for medication adjustment and/or medical intervention. : Yes   I attest that I was present, lead the team conference, and concur with  the assessment and plan of the team.   Ernest Pine 08/07/2022, 3:19 PM

## 2022-08-08 DIAGNOSIS — M5106 Intervertebral disc disorders with myelopathy, lumbar region: Secondary | ICD-10-CM | POA: Diagnosis not present

## 2022-08-08 NOTE — Progress Notes (Signed)
Physical Therapy Session Note  Patient Details  Name: Bryan [name] MRN: [id number] Date of Birth: 10-03-1946  Today's Date: 08/08/2022 PT Individual Time: 1305-1414 PT Individual Time Calculation (min): 69 min   Short Term Goals: Week 1:  PT Short Term Goal 1 (Week 1): = LTGs due to ELOS  Skilled Therapeutic Interventions/Progress Updates:    Pt received supine in bed and agrees to therapy. Reports pain "13/10" in severity. PT provides rest breaks as needed to manage pain. RN also comes to provide pain medicine during session. Pt performs supine to sit with cues for logrolling and sequencing, with bed features and cues for positioning at EOB. Pt performs sit to stand with RW and cues for hand placement, and stand step transfer to [facility] with cues for positioning. WC transport to gym for time management. Pt performs sit to stand with RW and cues for initiation. Pt ambulates x200' with RW and close supervision, with frequent tactile cues for scapular retraction and depression, and decreasing WB through RW for energy conservation and body mechanics. Following seated rest break, pt performs sit to stand with minA HHA on R and cues for anterior weight shift. Pt then ambulates x75' with minA R HHA, with cues for lateral weight shifting, upright gaze to improve posture and balance. For final x25' pt reqiures modA due to decreased stability and pt also reaching for objects for support. PT educates on safety and pt verbalizes understanding. Pt is hyper verbose and easily distracted, both during rest breaks and while mobilizing. Following rest break, pt ambulates x400' with RW and CGA, with cues for upright gaze to improve posture and balance, decreasing WB through RW for energy conservation, increasing gait speed to decrease risk for falls, and increasing proximity to RW for safety. Cues for hand placement to safely transfer back to [facility]. Pt left seated in WC with alarm intact and all needs within reach.  Therapy  Documentation Precautions:  Precautions Precautions: Back, Fall Precaution Booklet Issued: Yes (comment) Precaution Comments: no brace needed per orders Restrictions Weight Bearing Restrictions: No    Therapy/Group: Individual Therapy  Breck Coons, PT, DPT 08/08/2022, 5:11 PM

## 2022-08-08 NOTE — Progress Notes (Signed)
Physical Therapy Session Note  Patient Details  Name: RAKWON LETOURNEAU MRN: [id number] Date of Birth: 12/03/1945  Today's Date: 08/08/2022 PT Individual Time: 0800-0900 PT Individual Time Calculation (min): 60 min   Short Term Goals: Week 1:  PT Short Term Goal 1 (Week 1): = LTGs due to ELOS  Skilled Therapeutic Interventions/Progress Updates:      Therapy Documentation Precautions:  Precautions Precautions: Back, Fall Precaution Booklet Issued: Yes (comment) Precaution Comments: no brace needed per orders Restrictions Weight Bearing Restrictions: No  Pt received semi-reclined in bed, agreeable to PT session. Pt reports 5/10 pain and nursing notified and administered pain medications mid-session. Pt (S) with supine to sit with HOB flat and no use of rails. Pt (S) for upper/lower body dressing and dynamic standing balance with RW while pt pulled up pants. Pt (S) with sit to stand and ambulation to dayroom and requires verbal cues for sequencing and hand placement. Pt propelled NuStep x 10 min at level 5 to address strength and activity tolerance deficits. Pt transported by w/c to room for nurse to administer pain medication mid session and pt transported to ADL apartment for time management and energy conservation. Pt (S) with ambulation on carpet and participated in blocked practice of 5 sit to stand transfers from low sofa and 5 sit to stands from recliner to prepare for discharge home. Pt continues to require verbal cues for hand placement with transfers. Pt ambulated 335 ft from ADL apartment to room and left semi-reclined in bed with alarm on and all needs within reach.   Therapy/Group: Individual Therapy  Verl Dicker Verl Dicker PT, DPT  08/08/2022, 7:32 AM

## 2022-08-08 NOTE — Progress Notes (Signed)
Occupational Therapy Session Note  Patient Details  Name: KALEB SEK MRN: [id number] Date of Birth: [birth year]  Today's Date: 08/08/2022 OT Individual Time: 1000-1056 OT Individual Time Calculation (min): 56 min    Short Term Goals: Week 1:  OT Short Term Goal 1 (Week 1): STG=LTG d/t ELOS  Skilled Therapeutic Interventions/Progress Updates:    OT intervention with focus on bed mobility, sitting balance, sit<>stand, standing balance, LB dressing (donning pants), functional amb with RW, and activity tolerance to increase independence with BADLs. Supine>sit EOB with supervision. Sitting balance with supervision. Pt used reacher to thread pants over BLE. Sit<>stand with supervision. Pt able to pull pants over hips without assistance. Pt amb with RW to day room and engaged in game of corn hole while standing with supervision. Pt stood at table to clean all bean bags. Standing with supervision. Pt returned to room and amb with RW to bed. Pt remained in bed with all needs within reach and bed alarm activated.   Therapy Documentation Precautions:  Precautions Precautions: Back, Fall Precaution Booklet Issued: Yes (comment) Precaution Comments: no brace needed per orders Restrictions Weight Bearing Restrictions: No  Pain: Pt reports 11+/10 back and Rt hip pain; meds admin prior to therapy; repositioned  Therapy/Group: Individual Therapy  [name] 08/08/2022, 11:00 AM

## 2022-08-08 NOTE — Progress Notes (Signed)
PROGRESS NOTE   Subjective/Complaints:  Pt reports doing well- called wife to get more clothes.  Ate 100% tray.     ROS:   Pt denies SOB, abd pain, CP, N/V/C/D, and vision changes    Objective:   No results found. Recent Labs    08/07/22 0537  WBC 5.2  HGB 7.8*  HCT 23.9*  PLT 296   Recent Labs    08/07/22 0537  NA 137  K 3.9  CL 106  CO2 24  GLUCOSE 91  BUN 25*  CREATININE 1.37*  CALCIUM 8.5*    Intake/Output Summary (Last 24 hours) at 08/08/2022 0846 Last data filed at 08/08/2022 0735 Gross per 24 hour  Intake 1007 ml  Output 400 ml  Net 607 ml        Physical Exam: Vital Signs Blood pressure 131/67, pulse 82, temperature 98.4 F (36.9 C), temperature source Oral, resp. rate 16, height '5\' 5"'$  (1.651 m), weight 100 kg, SpO2 99 %.     General: awake, alert, appropriate, supine in bed; on phone;  NAD HENT: conjugate gaze; oropharynx moist CV: regular rate; no JVD Pulmonary: CTA B/L; no W/R/R- good air movement GI: soft, NT, ND, (+)BS Psychiatric: appropriate Neurological: Ox3  Skin: abd/back surgical incisions CDI with [facility]- looks stable Neuro:  Alert and oriented x 3. Normal insight and awareness. Intact Memory. Normal language and speech. Cranial nerve exam unremarkable. UE 5/5 RLE 2-3/5 prox to 4/5 distally. LLE 3- to 3/5 prox to 4/5 distal. No focal sensory losss Musculoskeletal: LB TTP and with    bed mobility    Assessment/Plan: 1. Functional deficits which require 3+ hours per day of interdisciplinary therapy in a comprehensive inpatient rehab setting. Physiatrist is providing close team supervision and 24 hour management of active medical problems listed below. Physiatrist and rehab team continue to assess barriers to discharge/monitor patient progress toward functional and medical goals  Care Tool:  Bathing    Body parts bathed by patient: Right arm, Left arm, Chest,  Abdomen, Front perineal area, Right upper leg, Left upper leg, Face   Body parts bathed by helper: Buttocks, Right lower leg, Left lower leg     Bathing assist Assist Level: Minimal Assistance - Patient > 75%     Upper Body Dressing/Undressing Upper body dressing   What is the patient wearing?: Pull over shirt    Upper body assist Assist Level: Set up assist    Lower Body Dressing/Undressing Lower body dressing      What is the patient wearing?: Pants, Underwear/pull up     Lower body assist Assist for lower body dressing: Maximal Assistance - Patient 25 - 49%     Toileting Toileting    Toileting assist Assist for toileting: 2 Helpers     Transfers Chair/bed transfer  Transfers assist     Chair/bed transfer assist level: Supervision/Verbal cueing     Locomotion Ambulation   Ambulation assist      Assist level: Supervision/Verbal cueing Assistive device: Walker-rolling Max distance: 330 feet   Walk 10 feet activity   Assist     Assist level: Supervision/Verbal cueing Assistive device: Walker-rolling   Walk 50 feet  activity   Assist    Assist level: Supervision/Verbal cueing Assistive device: Walker-rolling    Walk 150 feet activity   Assist Walk 150 feet activity did not occur: Safety/medical concerns (SOB; fatigue)  Assist level: Supervision/Verbal cueing Assistive device: Walker-rolling    Walk 10 feet on uneven surface  activity   Assist Walk 10 feet on uneven surfaces activity did not occur: Safety/medical concerns         Wheelchair     Assist Is the patient using a wheelchair?: Yes Type of Wheelchair: Manual    Wheelchair assist level: Supervision/Verbal cueing Max wheelchair distance: 200 feet    Wheelchair 50 feet with 2 turns activity    Assist        Assist Level: Supervision/Verbal cueing   Wheelchair 150 feet activity     Assist  Wheelchair 150 feet activity did not occur:  (fatigue,  SOB)   Assist Level: Supervision/Verbal cueing   Blood pressure 131/67, pulse 82, temperature 98.4 F (36.9 C), temperature source Oral, resp. rate 16, height '5\' 5"'$  (1.651 m), weight 100 kg, SpO2 99 %.  Medical Problem List and Plan: 1. Functional deficits secondary to psuedoarthrosis at L5-S1 with hardware loosening s/p ALIF with removal of hardware, and posterior lateral arthrodesis L4-S1 on 9/8             -patient may shower             -ELOS/Goals: 7-10 days, supervision goals  Con't CIR- PT and OT  D/c scheduled for 9/29- pt happy with this.  2. H/o PE/DVT Antithrombotics: -DVT/anticoagulation:  Mechanical: Sequential compression devices, below knee Bilateral lower extremities 9/26-  restarted Pradaxa             -antiplatelet therapy: N/A 3. Pain Management: Hydrocodone and/or flexeril prn.              -9/25- con't pain regimen- well controlled 4. Mood/Behavior/Sleep: LCSW to follow-up for evaluation and support.             -antipsychotic agents: N/A 5. Neuropsych/cognition: This patient is capable of making decisions on is own behalf. 6. Skin/Wound Care: Monitor incision for healing. --Routine pressure-relief measures. 7. Fluids/Electrolytes/Nutrition: Monitor I/O. Encourage intake. 8. HTN: Monitor BP TID--continue Toprol XL, lasix and Cardizem.  9/25 presently under control - con't regimen 9. AKI/Hypokalemia: Recheck labs in am.   9/25- last Cr 1.50- down from 1.67- will recheck in AM and push fluids  9/26- Cr down to 1.37 and BUN 25- will push fluids- since getting better, won't do IVFs. 9/27- will recheck labs in AM  10. H/o tremors: Continue primidone  11.  Anemia: Baseline Hgb around 10 per records review.  --Monitor H/H. Recheck CBC on 09/25.  9/27- Hb 7.8- down from 11. But hasn't been checked since right after surgery- no signs of bleeding- will check again tomorrow.  12. Pre-diabetes: Hgb A1C-5.8 on 01/2022 13. PAF: Monitor HR TID. Stable on Toprol and Cardizem  for rate control.   -HR controlled 14. Marginal B cell lymphoma/RA: Managed with Rituxan       LOS: 5 days A FACE TO FACE EVALUATION WAS PERFORMED  Bryan [name] 08/08/2022, 8:46 AM

## 2022-08-09 DIAGNOSIS — M5106 Intervertebral disc disorders with myelopathy, lumbar region: Secondary | ICD-10-CM | POA: Diagnosis not present

## 2022-08-09 LAB — CBC WITH DIFFERENTIAL/PLATELET
Abs Immature Granulocytes: 0.03 K/uL (ref 0.00–0.07)
Basophils Absolute: 0 K/uL (ref 0.0–0.1)
Basophils Relative: 0 %
Eosinophils Absolute: 0.1 K/uL (ref 0.0–0.5)
Eosinophils Relative: 3 %
HCT: 23.8 % — ABNORMAL LOW (ref 39.0–52.0)
Hemoglobin: 7.9 g/dL — ABNORMAL LOW (ref 13.0–17.0)
Immature Granulocytes: 1 %
Lymphocytes Relative: 17 %
Lymphs Abs: 0.9 K/uL (ref 0.7–4.0)
MCH: 31.6 pg (ref 26.0–34.0)
MCHC: 33.2 g/dL (ref 30.0–36.0)
MCV: 95.2 fL (ref 80.0–100.0)
Monocytes Absolute: 0.6 K/uL (ref 0.1–1.0)
Monocytes Relative: 11 %
Neutro Abs: 3.8 K/uL (ref 1.7–7.7)
Neutrophils Relative %: 68 %
Platelets: 356 K/uL (ref 150–400)
RBC: 2.5 MIL/uL — ABNORMAL LOW (ref 4.22–5.81)
RDW: 14.6 % (ref 11.5–15.5)
WBC: 5.4 K/uL (ref 4.0–10.5)
nRBC: 0 % (ref 0.0–0.2)

## 2022-08-09 LAB — BASIC METABOLIC PANEL WITH GFR
Anion gap: 6 (ref 5–15)
BUN: 26 mg/dL — ABNORMAL HIGH (ref 8–23)
CO2: 28 mmol/L (ref 22–32)
Calcium: 8.6 mg/dL — ABNORMAL LOW (ref 8.9–10.3)
Chloride: 106 mmol/L (ref 98–111)
Creatinine, Ser: 1.44 mg/dL — ABNORMAL HIGH (ref 0.61–1.24)
GFR, Estimated: 50 mL/min — ABNORMAL LOW
Glucose, Bld: 88 mg/dL (ref 70–99)
Potassium: 4.2 mmol/L (ref 3.5–5.1)
Sodium: 140 mmol/L (ref 135–145)

## 2022-08-09 MED ORDER — ACETAMINOPHEN 325 MG PO TABS: 325.0000 mg | ORAL_TABLET | ORAL | Status: DC | PRN

## 2022-08-09 MED ORDER — HYDROCODONE-ACETAMINOPHEN 5-325 MG PO TABS
1.0000 | ORAL_TABLET | ORAL | 0 refills | Status: DC | PRN
  Filled 2022-08-09: qty 52, 5d supply, fill #0

## 2022-08-09 MED ORDER — CYCLOBENZAPRINE HCL 5 MG PO TABS
5.0000 mg | ORAL_TABLET | Freq: Three times a day (TID) | ORAL | 0 refills | Status: DC | PRN
  Filled 2022-08-09: qty 30, 10d supply, fill #0

## 2022-08-09 MED ORDER — PANTOPRAZOLE SODIUM 40 MG PO TBEC
40.0000 mg | DELAYED_RELEASE_TABLET | Freq: Every day | ORAL | 0 refills | Status: DC
  Filled 2022-08-09: qty 30, 30d supply, fill #0

## 2022-08-09 NOTE — Discharge Instructions (Addendum)
Inpatient Rehab Discharge Instructions  Bryan [name] Discharge date and time:    Activities/Precautions/ Functional Status: Activity: no lifting, driving, or strenuous exercise till cleared by MD Diet: cardiac diet Wound Care: keep wound clean and dry. Contact Dr. Saintclair Halsted if you develop any problems with your incision/wound--redness, swelling, increase in pain, drainage or if you develop fever or chills.     Functional status:  ___ No restrictions     ___ Walk up steps independently ___ 24/7 supervision/assistance   ___ Walk up steps with assistance ___ Intermittent supervision/assistance  ___ Bathe/dress independently ___ Walk with walker     ___ Bathe/dress with assistance ___ Walk Independently    ___ Shower independently ___ Walk with assistance    ___ Shower with assistance ___ No alcohol     ___ Return to work/school ________  Special Instructions:    COMMUNITY REFERRALS UPON DISCHARGE:    HAVE DECIDED DO NOT WANT HOME HEALTH WILL FOLLOW UP WITH SURGEON AND DECIDE TO DO OPPT   Medical Equipment/Items Ordered: ROLLING WALKER AND TUB SEAT                                                 Agency[facility]Supplier:[facility] TO PROVIDE    My questions have been answered and I understand these instructions. I will adhere to these goals and the provided educational materials after my discharge from the hospital.  Patient/Caregiver Signature _______________________________ Date __________  Clinician Signature _______________________________________ Date __________  Please bring this form and your medication list with you to all your follow-up doctor's appointments.

## 2022-08-09 NOTE — Progress Notes (Signed)
PROGRESS NOTE   Subjective/Complaints:  Pt reports drinking well- Cr inching up slightly again.   Hb stable at 7.9- think it dropped due to surgery and hasn't recovered yet.   No issues.  Itching a lot around abdominal incision > back incision  ROS:   Pt denies SOB, abd pain, CP, N/V/C/D, and vision changes     Objective:   No results found. Recent Labs    08/07/22 0537 08/09/22 0630  WBC 5.2 5.4  HGB 7.8* 7.9*  HCT 23.9* 23.8*  PLT 296 356   Recent Labs    08/07/22 0537 08/09/22 0630  NA 137 140  K 3.9 4.2  CL 106 106  CO2 24 28  GLUCOSE 91 88  BUN 25* 26*  CREATININE 1.37* 1.44*  CALCIUM 8.5* 8.6*    Intake/Output Summary (Last 24 hours) at 08/09/2022 1023 Last data filed at 08/08/2022 1300 Gross per 24 hour  Intake 150 ml  Output --  Net 150 ml        Physical Exam: Vital Signs Blood pressure (!) 155/64, pulse 69, temperature 97.9 F (36.6 C), resp. rate 18, height '5\' 5"'$  (1.651 m), weight 100 kg, SpO2 98 %.      General: awake, alert, appropriate, sitting up in bed; wincing in pain with turning to look at incision; NAD HENT: conjugate gaze; oropharynx moist CV: regular rate; no JVD Pulmonary: CTA B/L; no W/R/R- good air movement GI: soft, NT, ND, (+)BS Psychiatric: appropriate- interactive Neurological: Ox3  Skin: abd dressing with large bandaid dressing- a little moist, but healing well- no erythema, no drainage, slightly puffy- glue Back incision no dressing- looks great- no drainage, no erythema Neuro:  Alert and oriented x 3. Normal insight and awareness. Intact Memory. Normal language and speech. Cranial nerve exam unremarkable. UE 5/5 RLE 2-3/5 prox to 4/5 distally. LLE 3- to 3/5 prox to 4/5 distal. No focal sensory losss Musculoskeletal: LB TTP and with    bed mobility    Assessment/Plan: 1. Functional deficits which require 3+ hours per day of interdisciplinary therapy in a  comprehensive inpatient rehab setting. Physiatrist is providing close team supervision and 24 hour management of active medical problems listed below. Physiatrist and rehab team continue to assess barriers to discharge/monitor patient progress toward functional and medical goals  Care Tool:  Bathing    Body parts bathed by patient: Right arm, Left arm, Chest, Abdomen, Front perineal area, Right upper leg, Left upper leg, Face   Body parts bathed by helper: Buttocks, Right lower leg, Left lower leg     Bathing assist Assist Level: Minimal Assistance - Patient > 75%     Upper Body Dressing/Undressing Upper body dressing   What is the patient wearing?: Pull over shirt    Upper body assist Assist Level: Set up assist    Lower Body Dressing/Undressing Lower body dressing      What is the patient wearing?: Pants, Underwear/pull up     Lower body assist Assist for lower body dressing: Maximal Assistance - Patient 25 - 49%     Toileting Toileting    Toileting assist Assist for toileting: 2 Helpers     Transfers Chair/bed  transfer  Transfers assist     Chair/bed transfer assist level: Supervision/Verbal cueing     Locomotion Ambulation   Ambulation assist      Assist level: Supervision/Verbal cueing Assistive device: Walker-rolling Max distance: 335  feet   Walk 10 feet activity   Assist     Assist level: Supervision/Verbal cueing Assistive device: Walker-rolling   Walk 50 feet activity   Assist    Assist level: Supervision/Verbal cueing Assistive device: Walker-rolling    Walk 150 feet activity   Assist Walk 150 feet activity did not occur: Safety/medical concerns (SOB; fatigue)  Assist level: Supervision/Verbal cueing Assistive device: Walker-rolling    Walk 10 feet on uneven surface  activity   Assist Walk 10 feet on uneven surfaces activity did not occur: Safety/medical concerns   Assist level: Supervision/Verbal  cueing Assistive device: Walker-rolling   Wheelchair     Assist Is the patient using a wheelchair?: Yes Type of Wheelchair: Manual    Wheelchair assist level: Supervision/Verbal cueing Max wheelchair distance: 200 feet    Wheelchair 50 feet with 2 turns activity    Assist        Assist Level: Supervision/Verbal cueing   Wheelchair 150 feet activity     Assist  Wheelchair 150 feet activity did not occur:  (fatigue, SOB)   Assist Level: Supervision/Verbal cueing   Blood pressure (!) 155/64, pulse 69, temperature 97.9 F (36.6 C), resp. rate 18, height '5\' 5"'$  (1.651 m), weight 100 kg, SpO2 98 %.  Medical Problem List and Plan: 1. 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 H/o PE/DVT Antithrombotics: -DVT/anticoagulation:  Mechanical: Sequential compression devices, below knee Bilateral lower extremities 9/26-  restarted Pradaxa             -antiplatelet therapy: N/A 3. Pain Management: Hydrocodone and/or flexeril prn.              -9/25- con't pain regimen- well controlled 4. Mood/Behavior/Sleep: LCSW to follow-up for evaluation and support.             -antipsychotic agents: N/A 5. Neuropsych/cognition: This patient is capable of making decisions on is own behalf. 6. Skin/Wound Care: Monitor incision for healing. --Routine pressure-relief measures.  9/28- some itching- suggested patting, not scratching- not enough he wants PO meds and topical will not work with incision.  7. Fluids/Electrolytes/Nutrition: Monitor I/O. Encourage intake. 8. HTN: Monitor BP TID--continue Toprol XL, lasix and Cardizem.  9/25 presently under control - con't regimen 9. AKI/Hypokalemia: Recheck labs in am.   9/25- last Cr 1.50- down from 1.67- will  recheck in AM and push fluids  9/26- Cr down to 1.37 and BUN 25- will push fluids- since getting better, won't do IVFs. 9/27- will recheck labs in AM   9/28- Cr 1.44- overall stable- explained to pt needs ot push fluids 10. H/o tremors: Continue primidone  11.  Anemia: Baseline Hgb around 10 per records review.  --Monitor H/H. Recheck CBC on 09/25.  9/27- Hb 7.8- down from 11. But hasn't been checked since right after surgery- no signs of bleeding- will check again tomorrow.  9/28- Hb 7.9- so likely due to bleeding during surgery and just now seeing  it 12. Pre-diabetes: Hgb A1C-5.8 on 01/2022 13. PAF: Monitor HR TID. Stable on Toprol and Cardizem for rate control.   -HR controlled 14. Marginal B cell lymphoma/RA: Managed with Rituxan       LOS: 6 days A FACE TO FACE EVALUATION WAS PERFORMED  Mirza Kidney 08/09/2022, 10:23 AM

## 2022-08-09 NOTE — Progress Notes (Signed)
Occupational Therapy Session Note  Patient Details  Name: JAISHON KRISHER MRN: [id number] Date of Birth: October 28, 1946  Today's Date: 08/09/2022 OT Individual Time: 1300-1340 OT Individual Time Calculation (min): 40 min    Short Term Goals: Week 1:  OT Short Term Goal 1 (Week 1): STG=LTG d/t ELOS  Skilled Therapeutic Interventions/Progress Updates:    Pt resting in bed upon arrival with wife present for educaiton. Pt and wife verbalized understanding of recommendation for 24 hour supervisoin. Pt practiced tub seat tranfser with suction grab bar. Pt will have tub seat at home. Pt backs up to tub edge and sits on seat before lifting BLE into tub. Recommended practice x 2 or more before actually taking shower. Pt returned to room. Pt reamained in w/c with wife present. All needs within reach.   Therapy Documentation Precautions:  Precautions Precautions: Back, Fall Precaution Booklet Issued: Yes (comment) Precaution Comments: no brace needed per orders Restrictions Weight Bearing Restrictions: No  Pain: Pt reports 6/10 back pain; repositioned  Therapy/Group: Individual Therapy  [name] 08/09/2022, 1:43 PM

## 2022-08-09 NOTE — Progress Notes (Signed)
Occupational Therapy Session Note  Patient Details  Name: Bryan [name] MRN: [id number] Date of Birth: 03-28-1946  Today's Date: 08/09/2022 OT Individual Time: 1045-1130 OT Individual Time Calculation (min): 45 min    Short Term Goals: Week 1:  OT Short Term Goal 1 (Week 1): STG=LTG d/t ELOS  Skilled Therapeutic Interventions/Progress Updates:    OT intervention with focus on bed mobility, standing balance, functional amb with RW, bathing/dressing at w/c level, toileting, and safety awareness to prepare for discharge home tomorrow. Pt completed all bed mobility, amb with RW, standing, and BADLs with supervision. Pt completed grooming tasks w/c level at sink. Pt returned to bed and remained in bed with all needs within reach. Bed alarm activated.   Therapy Documentation Precautions:  Precautions Precautions: Back, Fall Precaution Booklet Issued: Yes (comment) Precaution Comments: no brace needed per orders Restrictions Weight Bearing Restrictions: No Pain: Pt reports 11/10 back pain; repositioned    Therapy/Group: Individual Therapy  [name] 08/09/2022, 12:20 PM

## 2022-08-09 NOTE — Progress Notes (Signed)
Inpatient Rehabilitation Discharge Medication Review by a Pharmacist   A complete drug regimen review was completed for this patient to identify any potential clinically significant medication issues.   High Risk Drug Classes Is patient taking? Indication by Medication  Antipsychotic No   Anticoagulant Yes  Pradaxa-h/o PE/ DVT  Antibiotic No    Opioid Yes Norco( hydrocodone /APAP) - pain  Antiplatelet No    Hypoglycemics/insulin No    Vasoactive Medication Yes Metoprolol, diltiazem - rate control Furosemide - HTN  Chemotherapy No    Other Yes Primidone - tremor Tamsulosin - BPH Protonix - GERD Nitroglycerin (SL) - chest pain Flexeril - muscle spasms        Type of Medication Issue Identified Description of Issue Recommendation(s)  Drug Interaction(s) (clinically significant)        Duplicate Therapy        Allergy        No Medication Administration End Date        Incorrect Dose        Additional Drug Therapy Needed      Significant med changes from prior encounter (inform family/care partners about these prior to discharge).      Other            Clinically significant medication issues were identified that warrant physician communication and completion of prescribed/recommended actions by midnight of the next day:  No   Name of provider notified for urgent issues identified:    Provider Method of Notification:    Pharmacist comments:    Time spent performing this drug regimen review (minutes):  [place], RPh Clinical Pharmacist 08/10/2022

## 2022-08-09 NOTE — Progress Notes (Signed)
Physical Therapy Discharge Summary  Patient Details  Name: Bryan [name] MRN: [id number] Date of Birth: 04-Jan-1946  Date of Discharge from PT service:August 09, 2022   Patient has met 10 of 10 long term goals due to improved activity tolerance, improved balance, improved postural control, increased strength, increased range of motion, decreased pain, ability to compensate for deficits, and improved coordination.  Patient to discharge at an ambulatory level Supervision.   Pt's spouse participated in family education and expressed understanding with how to support and supervise patient with functional mobility upon discharge.   Reasons goals not met: N/A  Recommendation:  Patient will benefit from ongoing skilled PT services in home health setting to continue to advance safe functional mobility, address ongoing impairments in balance, coordination, activity tolerance, postural control, and minimize fall risk.  Equipment: RW  Reasons for discharge: treatment goals met and discharge from hospital  Patient/family agrees with progress made and goals achieved: Yes  PT Discharge  Pain Interference Pain Interference Pain Effect on Sleep: 2. Occasionally Pain Interference with Therapy Activities: 3. Frequently Pain Interference with Day-to-Day Activities: 2. Occasionally Cognition Overall Cognitive Status: History of cognitive impairments - at baseline Arousal/Alertness: Awake/alert Orientation Level: Oriented X4 Memory: Impaired Awareness: Appears intact Problem Solving: Appears intact Safety/Judgment: Impaired Sensation Sensation Light Touch: Appears Intact Proprioception: Appears Intact Coordination Gross Motor Movements are Fluid and Coordinated: Yes Fine Motor Movements are Fluid and Coordinated: Yes Coordination and Movement Description: grossly coordinated due to strength and balance improvements Finger Nose Finger Test: intact Heel Shin Test: intact Motor   Motor Motor: Within Functional Limits Motor - Skilled Clinical Observations: improved LE strength, coordination and balance  Mobility Bed Mobility Bed Mobility: Rolling Right;Rolling Left;Supine to Sit;Sit to Supine Rolling Right: Supervision/verbal cueing Rolling Left: Supervision/Verbal cueing Supine to Sit: Supervision/Verbal cueing Sit to Supine: Supervision/Verbal cueing Transfers Transfers: Stand to Sit;Sit to Stand;Stand Pivot Transfers Sit to Stand: Supervision/Verbal cueing Stand to Sit: Supervision/Verbal cueing Stand Pivot Transfers: Supervision/Verbal cueing Stand Pivot Transfer Details: Verbal cues for safe use of DME/AE Transfer (Assistive device): Rolling walker Locomotion  Gait Ambulation: Yes Gait Assistance: Supervision/Verbal cueing Gait Distance (Feet): 400 Feet (feet) Assistive device: Rolling walker Gait Assistance Details: Verbal cues for safe use of DME/AE Gait Gait: Yes Gait Pattern: Within Functional Limits Gait Pattern: Shuffle;Trunk flexed Gait velocity: decreased Stairs / Additional Locomotion Stairs: Yes Stairs Assistance: Supervision/Verbal cueing Stair Management Technique: Two rails Number of Stairs: 8 Height of Stairs: 6 Ramp: Supervision/Verbal cueing Curb: Supervision/Verbal cueing Pick up small object from the floor (from standing position) activity did not occur: Safety/medical concerns (unable to bend due to spine precautions) Wheelchair Mobility Wheelchair Mobility: Yes Wheelchair Assistance: Chartered loss adjuster: Both upper extremities Wheelchair Parts Management: Needs assistance Distance: 200 feet  Trunk/Postural Assessment  Cervical Assessment Cervical Assessment: Within Functional Limits Thoracic Assessment Thoracic Assessment: Exceptions to [facility] (rounded shoulders, flexed trunk) Lumbar Assessment Lumbar Assessment: Within Functional Limits Postural Control Postural Control: Within Functional  Limits  Balance Balance Balance Assessed: Yes Dynamic Sitting Balance Dynamic Sitting - Balance Support: Feet supported Dynamic Sitting - Level of Assistance: 5: Stand by assistance (Supervision) Dynamic Sitting - Balance Activities: Reaching across midline Static Standing Balance Static Standing - Balance Support: During functional activity;Bilateral upper extremity supported Static Standing - Level of Assistance: 5: Stand by assistance (Supervision) Dynamic Standing Balance Dynamic Standing - Balance Support: During functional activity;Bilateral upper extremity supported Dynamic Standing - Level of Assistance: 5: Stand by assistance (Supervision) Dynamic Standing - Balance  Activities: Lateral lean/weight shifting Extremity Assessment  RLE Assessment RLE Assessment: Within Functional Limits General Strength Comments: grossly 4+/5 LLE Assessment LLE Assessment: Within Functional Limits General Strength Comments: grossly 4+/5   Tanja Port PT, DPT  08/09/2022, 12:37 PM

## 2022-08-09 NOTE — Progress Notes (Signed)
Occupational Therapy Discharge Summary  Patient Details  Name: Bryan [name] MRN: [id number] Date of Birth: [birth year]  Date of Discharge from OT service:August 09, 2022     Patient has met 11 of 11 long term goals due to improved activity tolerance, improved balance, postural control, and ability to compensate for deficits.  Pt made steady progress with BADLs and functional transfers during this admission. Pt completes bathing/dressing and toileting tasks with supervision. All functional transfers with supervision. Pt's wife has been present and participated in therapy sessions. Pt's wife provides the appropriate level of supervision/assistance. Patient to discharge at overall Supervision level.  Patient's care partner is independent to provide the necessary physical assistance at discharge.    Reasons goals not met: n/a  Recommendation:  No OT f/u at this time recommended.   Equipment: No equipment provided  Reasons for discharge: treatment goals met and discharge from hospital  Patient/family agrees with progress made and goals achieved: Yes  OT Discharge ADL ADL Equipment Provided: Reacher, Sock aid, Long-handled sponge Eating: Independent Grooming: Modified independent Where Assessed-Grooming: Sitting at sink Upper Body Bathing: Modified independent Where Assessed-Upper Body Bathing: Wheelchair, Sitting at sink Lower Body Bathing: Supervision/safety Where Assessed-Lower Body Bathing: Wheelchair, Sitting at sink, Standing at sink Upper Body Dressing: Supervision/safety Where Assessed-Upper Body Dressing: Sitting at sink, Wheelchair Lower Body Dressing: Independent Where Assessed-Lower Body Dressing: Sitting at sink, Wheelchair Toileting: Supervision/safety Where Assessed-Toileting: Glass blower/designer: Close supervision Toilet Transfer Method: Counselling psychologist: Raised toilet seat Tub/Shower Transfer: Close supervison Clinical cytogeneticist  Method: Optometrist: Civil engineer, contracting without back Social research officer, government: Environmental education officer Method: Ambulating ADL Comments: Pt completes all funcitonal transfers with RW and MIN A. Occlusives applied to incisions anterior and posterior. Pt completes BADL at assist levels above. continued education on back precautions and implications for ADL provided. Pt recalls use of AE from prior back surgery but requries cuing to implement. Pt would benefit from continued AE training to improve efficiency and independence wiht BADLs and transfers. Pt participates in goal setting and initating discussion of DC plan. Vision Baseline Vision/History: 1 Wears glasses Patient Visual Report: No change from baseline Vision Assessment?: No apparent visual deficits Perception  Perception: Within Functional Limits Praxis Praxis: Intact Cognition Cognition Overall Cognitive Status: History of cognitive impairments - at baseline Arousal/Alertness: Awake/alert Orientation Level: Person;Place;Situation Person: Oriented Place: Oriented Situation: Oriented Memory: Impaired Memory Impairment: Decreased recall of new information;Decreased short term memory Decreased Short Term Memory: Functional basic Awareness: Appears intact Problem Solving: Appears intact Safety/Judgment: Impaired Brief Interview for Mental Status (BIMS) Repetition of Three Words (First Attempt): 3 Temporal Orientation: Year: Correct Temporal Orientation: Month: Accurate within 5 days Temporal Orientation: Day: Correct Recall: "Sock": Yes, after cueing ("something to wear") Recall: "Blue": Yes, no cue required Recall: "Bed": Yes, no cue required BIMS Summary Score: 14 Sensation Sensation Light Touch: Appears Intact Hot/Cold: Appears Intact Proprioception: Appears Intact Stereognosis: Not tested Coordination Gross Motor Movements are Fluid and Coordinated: Yes Fine Motor Movements are Fluid  and Coordinated: Yes Coordination and Movement Description: grossly coordinated due to strength and balance improvements Finger Nose Finger Test: intact Heel Shin Test: intact Motor  Motor Motor: Within Functional Limits Motor - Skilled Clinical Observations: improved LE strength, coordination and balance Mobility  Bed Mobility Bed Mobility: Rolling Right;Rolling Left;Supine to Sit;Sit to Supine Rolling Right: Supervision/verbal cueing Rolling Left: Supervision/Verbal cueing Supine to Sit: Supervision/Verbal cueing Sit to Supine: Supervision/Verbal cueing Transfers Sit to Stand: Supervision/Verbal cueing Stand  to Sit: Supervision/Verbal cueing  Trunk/Postural Assessment  Cervical Assessment Cervical Assessment: Within Functional Limits Thoracic Assessment Thoracic Assessment: Exceptions to [facility] (rounded shoulders) Lumbar Assessment Lumbar Assessment: Within Functional Limits Postural Control Postural Control: Within Functional Limits  Balance Balance Balance Assessed: Yes Dynamic Sitting Balance Dynamic Sitting - Balance Support: During functional activity Dynamic Sitting - Level of Assistance: 6: Modified independent (Device/Increase time) Dynamic Sitting - Balance Activities: Reaching across midline Static Standing Balance Static Standing - Balance Support: During functional activity;Bilateral upper extremity supported Static Standing - Level of Assistance: 5: Stand by assistance (Supervision) Dynamic Standing Balance Dynamic Standing - Balance Support: During functional activity;Bilateral upper extremity supported Dynamic Standing - Level of Assistance: 5: Stand by assistance (Supervision) Dynamic Standing - Balance Activities: Lateral lean/weight shifting Extremity/Trunk Assessment RUE Assessment RUE Assessment: Within Functional Limits LUE Assessment LUE Assessment: Within Functional Limits   Bryan [name] 08/09/2022, 1:42 PM

## 2022-08-09 NOTE — Progress Notes (Signed)
Physical Therapy Session Note  Patient Details  Name: Bryan [name] MRN: [id number] Date of Birth: 1946-10-19  Today's Date: 08/09/2022 PT Individual Time: 0848-1000 PT Individual Time Calculation (min): 72 min & 46 min   Short Term Goals: Week 1:  PT Short Term Goal 1 (Week 1): = LTGs due to ELOS  Skilled Therapeutic Interventions/Progress Updates:      Therapy Documentation Precautions:  Precautions Precautions: Back, Fall Precaution Booklet Issued: Yes (comment) Precaution Comments: no brace needed per orders Restrictions Weight Bearing Restrictions: No  Treatment Session 1:  Pt received semi-reclined in bed, agreeable to PT session. Pt reports 3/10 radiating low back pain and nursing notified and adminstered medication at start of session. PT assessed pain interference, sensation, coordination and strength for d/c preparation. Pt requests need to void and (S) with rolling, supine to sit, sit to stand and ambulation ~10 ft to toilet. Pt successfully voided and ambulated ~10 ft to sit edge of bed as PT dependently donned ted hose for edema management. Pt ambulated to ortho gym with (S) and requires verbal cueing for posture and AD management. Pt participated in blocked practice of sit to stand with no UE support and performed 2 x 5 with (S). Pt participated in five time sit to stand assessment and required 12 seconds to complete task safely. Pt within normal performance values for age group. Pt (S) for safety with car transfer and navigation of unlevel surface x 10 via ramp. Pt ambulated to main gym and requires supervision for stairs x 8 with 2 HR's. Pt ambulated to room and left semi-reclined in bed with alarm on and all needs within reach.    Five times Sit to Stand Test (FTSS) Method: Use a straight back chair with a solid seat that is 16-18" high. Ask participant to sit on the chair with arms folded across their chest.   Instructions: "Stand up and sit down as quickly as  possible 5 times, keeping your arms folded across your chest."   Measurement: Stop timing when the participant stands the 5th time.  TIME: ______ (in seconds)  Times > 13.6 seconds is associated with increased disability and morbidity (Guralnik, 2000) Times > 15 seconds is predictive of recurrent falls in healthy individuals aged 32 and older (Buatois, et al., 2008) Normal performance values in community dwelling individuals aged 80 and older (Bohannon, 2006): 60-69 years: 11.4 seconds 70-79 years: 12.6 seconds 80-89 years: 14.8 seconds  MCID: ? 2.3 seconds for Vestibular Disorders (Meretta, 2006)  Treatment Session 2:  Pt received seated in w/c at bedside and reports no pain and pre-medicated. Pt (S) with all mobility throughout session and pt spouse present for family education. Pt spouse educated on sit to stand, stand pivot, car transfers, gait and stair negotiation x 4 with 2 HR's. Pt ambulated >400 ft with RW throughout session and continues to require cues for scapular depression and AD management. Pt and spouse report they are going to get rails installed on steps to enter residence prior to discharge. Pt ambulated to room and left semi-reclined in bed with all needs in reach and alarm on.    Therapy/Group: Individual Therapy  Verl Dicker Verl Dicker PT, DPT  08/09/2022, 7:43 AM

## 2022-08-10 ENCOUNTER — Other Ambulatory Visit (HOSPITAL_COMMUNITY): Payer: Self-pay

## 2022-08-10 DIAGNOSIS — N179 Acute kidney failure, unspecified: Secondary | ICD-10-CM | POA: Diagnosis not present

## 2022-08-10 DIAGNOSIS — I4891 Unspecified atrial fibrillation: Secondary | ICD-10-CM

## 2022-08-10 DIAGNOSIS — I10 Essential (primary) hypertension: Secondary | ICD-10-CM | POA: Diagnosis not present

## 2022-08-10 DIAGNOSIS — M5106 Intervertebral disc disorders with myelopathy, lumbar region: Secondary | ICD-10-CM | POA: Diagnosis not present

## 2022-08-10 NOTE — Discharge Summary (Signed)
Physician Discharge Summary  Patient ID: Bryan [name] MRN: [id number] DOB/AGE: 12-12-1945 76 y.o.  Admit date: 08/03/2022 Discharge date: 08/10/2022  Discharge Diagnoses:  Principal Problem:   Lumbar disc disorder with myelopathy Active Problems:   Acute blood loss anemia   Primary hypertension   Chronic kidney disease   Discharged Condition: stable  Significant Diagnostic Studies:  Labs:  Basic Metabolic Panel: Recent Labs  Lab 08/07/22 0537 08/09/22 0630  NA 137 140  K 3.9 4.2  CL 106 106  CO2 24 28  GLUCOSE 91 88  BUN 25* 26*  CREATININE 1.37* 1.44*  CALCIUM 8.5* 8.6*    CBC:    Latest Ref Rng & Units 08/09/2022    6:30 AM 08/07/2022    5:37 AM 07/23/2022    1:51 PM  CBC  WBC 4.0 - 10.5 K/uL 5.4  5.2  4.9   Hemoglobin 13.0 - 17.0 g/dL 7.9  7.8  11.7   Hematocrit 39.0 - 52.0 % 23.8  23.9  36.4   Platelets 150 - 400 K/uL 356  296  232      CBG: No results for input(s): "GLUCAP" in the last 168 hours.  Brief HPI:   Bryan [name] is a 76 y.o. male with history of HTN, OSA, RA, PE/DVT, prior L2-S1 fusion who started developing progressive low back pain radiating to hip due to pseudoarthrosis at L5-S1 with loosening of screws.  He was admitted on 07/20/2022 for ALIF, exploration of fusion with removal of hardware and posterior lateral arthrodesis L4-S1 by Dr. Saintclair Halsted and Dr. Carlis Abbott.  Postprocedure, he reported decrease in lower extremity pain but continued to be limited by RLE > LLE weakness, impaired balance as well as decrease in activity tolerance.  PT OT was working with patient and CIR was recommended due to functional decline.   Hospital Course: Bryan [name] was admitted to rehab 08/03/2022 for inpatient therapies to consist of PT and OT at least three hours five days a week. Past admission physiatrist, therapy team and rehab RN have worked together to provide customized collaborative inpatient rehab. Blood pressures were monitored on TID basis and has been  controlled.  Abdominal and back incisions is clean, dry and intact and is healing well without any signs or symptoms of infection.  Follow-up check of CBC showed acute blood loss anemia is resolving.  Check of electrolytes showed CKD to be stable.  Pradaxa was resumed on 09/26.  Pain has been reasonably controlled with as needed use of hydrocodone.  Bowel program has been augmented to manage constipation.  He is continent of bowel and bladder.  He has made good gains during his rehab stay and supervision is recommended at discharge.  Home health therapy was recommended however patient and wife declined this.   Rehab course: During patient's stay in rehab weekly team conferences were held to monitor patient's progress, set goals and discuss barriers to discharge. At admission, patient required mod assist with mobility and min to max assist with basic self-care tasks. He  has had improvement in activity tolerance, balance, postural control as well as ability to compensate for deficits. He is able to complete ADL tasks with supervision. He requires supervision for transfers and to ambulate 400  with RW and cues for safe use DME. Family education has been completed.   Follow-up patient disposition: Home  Diet: Regular.   Special Instructions: No driving till cleared by MD.  Allergies as of 08/10/2022       Reactions  Terazosin Other (See Comments)   Hypotension   Adalimumab Other (See Comments)   (Humira) Lymphoma   Statins Other (See Comments)   Memory issues   Ampicillin Nausea Only   Tolerated ancef on 07-30-22 without issue intraoperativelty   Lisinopril Cough   Other Other (See Comments)   BLOOD PRODUCT REFUSAL (patient is a Jehovah's Witness)        Medication List     STOP taking these medications    hydrALAZINE 50 MG tablet Commonly known as: APRESOLINE   methocarbamol 750 MG tablet Commonly known as: Robaxin-750       TAKE these medications    acetaminophen 325 MG  tablet Commonly known as: TYLENOL Take 1-2 tablets (325-650 mg total) by mouth every 4 (four) hours as needed for mild pain. What changed:  medication strength how much to take when to take this reasons to take this   cyclobenzaprine 5 MG tablet Commonly known as: FLEXERIL Take 1 tablet (5 mg total) by mouth every 8 (eight) hours as needed for muscle spasms.   dabigatran 150 MG Caps capsule Commonly known as: PRADAXA Take 150 mg by mouth 2 (two) times daily.   diltiazem 120 MG 24 hr capsule Commonly known as: CARDIZEM CD Take 1 capsule (120 mg total) by mouth daily.   furosemide 20 MG tablet Commonly known as: LASIX Take 1 tablet (20 mg total) by mouth daily.   HYDROcodone-acetaminophen 5-325 MG tablet--Rx# 52 pills Commonly known as: NORCO/VICODIN Take 1-2 tablets by mouth every 4 (four) hours as needed for severe pain. What changed: reasons to take this   LACTOBACILLUS PO Take 1 capsule by mouth 2 (two) times daily.   metoprolol succinate 100 MG 24 hr tablet Commonly known as: TOPROL-XL TAKE 1 TABLET BY MOUTH ONCE DAILY WITH  OR  IMMEDIATELY  FOLLOWING  A  MEAL   multivitamin with minerals Tabs tablet Take 1 tablet by mouth daily.   nitroGLYCERIN 0.4 MG SL tablet Commonly known as: NITROSTAT Place 0.4 mg under the tongue every 5 (five) minutes as needed for chest pain.   pantoprazole 40 MG tablet Commonly known as: PROTONIX Take 1 tablet (40 mg total) by mouth at bedtime. Notes to patient: For reflux/indigestion   primidone 50 MG tablet Commonly known as: MYSOLINE Take 150 mg by mouth 2 (two) times daily.   tamsulosin 0.4 MG Caps capsule Commonly known as: FLOMAX Take 0.4 mg by mouth at bedtime.        Follow-up Information     [name], FNP Follow up.   Specialty: Family Medicine Why: Call in 1-2 days for post hospital follow up Contact information: [place] [address] [phone]         Kary Kos, MD Follow up  on 08/14/2022.   Specialty: Neurosurgery Why: post op appointment Contact information: [place]. [address] [place] [address] [phone]         Courtney Heys, MD Follow up.   Specialty: Physical Medicine and Rehabilitation Why: office will call you with follow up appointment Contact information: [address] [place] [address] [phone]                 Signed: Bary Leriche 08/11/2022, 10:04 PM

## 2022-08-10 NOTE — Progress Notes (Signed)
Patient ID: Bryan [name], male   DOB: 1946/06/22, 76 y.o.   MRN: [id number] Pt and wife have decided do not want home health so have cancelled will follow up with surgeon and decide if wants OP

## 2022-08-10 NOTE — Progress Notes (Signed)
Inpatient Rehabilitation Care Coordinator Discharge Note   Patient Details  Name: Bryan [name] MRN: [id number] Date of Birth: 12-05-1945   Discharge location: [place] 24/7 SUPERVISION  Length of Stay: 7 DAYS  Discharge activity level: SUPERVISION LEVEL  Home/community participation: ACTIVE  Patient response DP:OEUMPN Literacy - How often do you need to have someone help you when you read instructions, pamphlets, or other written material from your doctor or pharmacy?: Never  Patient response TI:RWERXV Isolation - How often do you feel lonely or isolated from those around you?: Never  Services provided included: RD, MD, PT, OT, RN, CM, Pharmacy, SW  Financial Services:  Financial Services Utilized: Risk manager  Choices offered to/list presented to: PT AND WIFE  Follow-up services arranged:  Home Health, DME, Patient/Family has no preference for HH/DME agencies [place]: [facility]  PT OT RN    DME : [facility]    Patient response to transportation need: Is the patient able to respond to transportation needs?: Yes In the past 12 months, has lack of transportation kept you from medical appointments or from getting medications?: No In the past 12 months, has lack of transportation kept you from meetings, work, or from getting things needed for daily living?: No    Comments (or additional information):WIFE WAS IN FOR HANDS ON EDUCATION AND FEELS [place]. SHE HAS 24/7 SUPERVISION SET UP FOR HIM  Patient/Family verbalized understanding of follow-up arrangements:  Yes  Individual responsible for coordination of the follow-up plan: VICTORIA-WIFE [phone]  Confirmed correct DME delivered: Elease Hashimoto 08/10/2022    Samarrah Tranchina, [place] Rhyme

## 2022-08-10 NOTE — Progress Notes (Signed)
PROGRESS NOTE   Subjective/Complaints: No new concerns this AM. DC home today.   ROS:   Pt denies SOB,HA, abd pain, CP, N/V/C/D, and vision changes     Objective:   No results found. Recent Labs    08/09/22 0630  WBC 5.4  HGB 7.9*  HCT 23.8*  PLT 356    Recent Labs    08/09/22 0630  NA 140  K 4.2  CL 106  CO2 28  GLUCOSE 88  BUN 26*  CREATININE 1.44*  CALCIUM 8.6*     Intake/Output Summary (Last 24 hours) at 08/10/2022 0927 Last data filed at 08/10/2022 0440 Gross per 24 hour  Intake 237 ml  Output 500 ml  Net -263 ml         Physical Exam: Vital Signs Blood pressure (!) 140/74, pulse 72, temperature 97.6 F (36.4 C), resp. rate 16, height '5\' 5"'$  (1.651 m), weight 100 kg, SpO2 99 %.      General: awake, alert, appropriate, sitting up in bed;NAD HENT: conjugate gaze; oropharynx moist CV: regular rate; no JVD Pulmonary: CTA B/L; no W/R/R- good air movement GI: soft, NT, ND, (+)BS Psychiatric: appropriate- interactive Neurological: Ox3  Skin: abd dressing with large bandaid dressing- a little moist, but healing well- no erythema, no drainage, slightly puffy- glue Back incision no dressing- looks great- no drainage, no erythema Neuro:  Alert and oriented x 3. Normal insight and awareness. Follows commands, Intact Memory. Normal language and speech. Cranial nerve exam unremarkable. UE 5/5 RLE 2-3/5 prox to 4/5 distally. LLE 3- to 3/5 prox to 4/5 distal. No focal sensory losss Musculoskeletal: LB TTP and with    bed mobility    Assessment/Plan: 1. Functional deficits which require 3+ hours per day of interdisciplinary therapy in a comprehensive inpatient rehab setting. Physiatrist is providing close team supervision and 24 hour management of active medical problems listed below. Physiatrist and rehab team continue to assess barriers to discharge/monitor patient progress toward functional and  medical goals  Care Tool:  Bathing    Body parts bathed by patient: Right arm, Left arm, Chest, Abdomen, Front perineal area, Buttocks, Right upper leg, Left upper leg, Right lower leg, Left lower leg, Face   Body parts bathed by helper: Buttocks, Right lower leg, Left lower leg     Bathing assist Assist Level: Supervision/Verbal cueing     Upper Body Dressing/Undressing Upper body dressing   What is the patient wearing?: Pull over shirt    Upper body assist Assist Level: Independent    Lower Body Dressing/Undressing Lower body dressing      What is the patient wearing?: Pants     Lower body assist Assist for lower body dressing: Supervision/Verbal cueing     Toileting Toileting    Toileting assist Assist for toileting: Supervision/Verbal cueing     Transfers Chair/bed transfer  Transfers assist     Chair/bed transfer assist level: Supervision/Verbal cueing     Locomotion Ambulation   Ambulation assist      Assist level: Supervision/Verbal cueing Assistive device: Walker-rolling Max distance: 400 feet   Walk 10 feet activity   Assist     Assist level: Supervision/Verbal cueing  Assistive device: Walker-rolling   Walk 50 feet activity   Assist    Assist level: Supervision/Verbal cueing Assistive device: Walker-rolling    Walk 150 feet activity   Assist Walk 150 feet activity did not occur: Safety/medical concerns (SOB; fatigue)  Assist level: Supervision/Verbal cueing Assistive device: Walker-rolling    Walk 10 feet on uneven surface  activity   Assist Walk 10 feet on uneven surfaces activity did not occur: Safety/medical concerns   Assist level: Supervision/Verbal cueing Assistive device: Walker-rolling   Wheelchair     Assist Is the patient using a wheelchair?: Yes Type of Wheelchair: Manual    Wheelchair assist level: Supervision/Verbal cueing Max wheelchair distance: 200 feet    Wheelchair 50 feet with 2  turns activity    Assist        Assist Level: Supervision/Verbal cueing   Wheelchair 150 feet activity     Assist  Wheelchair 150 feet activity did not occur:  (fatigue, SOB)   Assist Level: Supervision/Verbal cueing   Blood pressure (!) 140/74, pulse 72, temperature 97.6 F (36.4 C), resp. rate 16, height '5\' 5"'$  (1.651 m), weight 100 kg, SpO2 99 %.  Medical Problem List and Plan: 1. Functional deficits secondary to psuedoarthrosis at L5-S1 with hardware loosening s/p ALIF with removal of hardware, and posterior lateral arthrodesis L4-S1 on 9/8             -patient may shower             -ELOS/Goals: 7-10 days, supervision goals  Con't CIR- PT and OT  -DC home today 2. H/o PE/DVT Antithrombotics: -DVT/anticoagulation:  Mechanical: Sequential compression devices, below knee Bilateral lower extremities 9/26-  restarted Pradaxa             -antiplatelet therapy: N/A 3. Pain Management: Hydrocodone and/or flexeril prn.              -9/25- con't pain regimen- well controlled 4. Mood/Behavior/Sleep: LCSW to follow-up for evaluation and support.             -antipsychotic agents: N/A 5. Neuropsych/cognition: This patient is capable of making decisions on is own behalf. 6. Skin/Wound Care: Monitor incision for healing. --Routine pressure-relief measures.  9/28- some itching- suggested patting, not scratching- not enough he wants PO meds and topical will not work with incision.  7. Fluids/Electrolytes/Nutrition: Monitor I/O. Encourage intake. 8. HTN: Monitor BP TID--continue Toprol XL, lasix and Cardizem.  9/25 presently under control - con't regimen 9/29 fair control, continue current regimen 9. AKI/Hypokalemia: Recheck labs in am.   9/25- last Cr 1.50- down from 1.67- will recheck in AM and push fluids  9/26- Cr down to 1.37 and BUN 25- will push fluids- since getting better, won't do IVFs. 9/27- will recheck labs in AM   9/28- Cr 1.44- overall stable- explained to pt needs  ot push fluids  9/29 continue to encourage oral fluids 10. H/o tremors: Continue primidone  11.  Anemia: Baseline Hgb around 10 per records review.  --Monitor H/H. Recheck CBC on 09/25.  9/27- Hb 7.8- down from 11. But hasn't been checked since right after surgery- no signs of bleeding- will check again tomorrow.  9/28- Hb 7.9- so likely due to bleeding during surgery and just now seeing it 12. Pre-diabetes: Hgb A1C-5.8 on 01/2022 13. PAF: Monitor HR TID. Stable on Toprol and Cardizem for rate control.   -9/29 HR well controlled at 72 14. Marginal B cell lymphoma/RA: Managed with Rituxan  LOS: 7 days A FACE TO FACE EVALUATION WAS PERFORMED  Jennye Boroughs 08/10/2022, 9:27 AM

## 2022-08-11 DIAGNOSIS — N189 Chronic kidney disease, unspecified: Secondary | ICD-10-CM

## 2022-08-27 ENCOUNTER — Encounter
Payer: No Typology Code available for payment source | Attending: Physical Medicine & Rehabilitation | Admitting: Physical Medicine & Rehabilitation

## 2022-08-30 ENCOUNTER — Encounter: Payer: Self-pay | Admitting: Gastroenterology

## 2022-09-25 ENCOUNTER — Other Ambulatory Visit: Payer: Self-pay

## 2022-09-25 DIAGNOSIS — C8580 Other specified types of non-Hodgkin lymphoma, unspecified site: Secondary | ICD-10-CM

## 2022-09-26 ENCOUNTER — Other Ambulatory Visit: Payer: Self-pay

## 2022-09-26 ENCOUNTER — Inpatient Hospital Stay (HOSPITAL_BASED_OUTPATIENT_CLINIC_OR_DEPARTMENT_OTHER): Payer: Non-veteran care | Admitting: Hematology

## 2022-09-26 ENCOUNTER — Ambulatory Visit: Payer: Medicare HMO | Admitting: Hematology

## 2022-09-26 ENCOUNTER — Other Ambulatory Visit: Payer: Medicare HMO

## 2022-09-26 ENCOUNTER — Inpatient Hospital Stay: Payer: Non-veteran care | Attending: Hematology

## 2022-09-26 VITALS — BP 160/65 | HR 64 | Temp 98.1°F | Resp 18 | Ht 65.0 in | Wt 216.6 lb

## 2022-09-26 DIAGNOSIS — C8580 Other specified types of non-Hodgkin lymphoma, unspecified site: Secondary | ICD-10-CM

## 2022-09-26 DIAGNOSIS — M069 Rheumatoid arthritis, unspecified: Secondary | ICD-10-CM | POA: Diagnosis not present

## 2022-09-26 DIAGNOSIS — Z87891 Personal history of nicotine dependence: Secondary | ICD-10-CM | POA: Insufficient documentation

## 2022-09-26 DIAGNOSIS — E538 Deficiency of other specified B group vitamins: Secondary | ICD-10-CM | POA: Diagnosis not present

## 2022-09-26 DIAGNOSIS — Z9221 Personal history of antineoplastic chemotherapy: Secondary | ICD-10-CM | POA: Insufficient documentation

## 2022-09-26 DIAGNOSIS — Z79899 Other long term (current) drug therapy: Secondary | ICD-10-CM | POA: Insufficient documentation

## 2022-09-26 DIAGNOSIS — Z8572 Personal history of non-Hodgkin lymphomas: Secondary | ICD-10-CM | POA: Diagnosis not present

## 2022-09-26 DIAGNOSIS — N189 Chronic kidney disease, unspecified: Secondary | ICD-10-CM | POA: Insufficient documentation

## 2022-09-26 LAB — CMP (CANCER CENTER ONLY)
ALT: 16 U/L (ref 0–44)
AST: 20 U/L (ref 15–41)
Albumin: 3.8 g/dL (ref 3.5–5.0)
Alkaline Phosphatase: 219 U/L — ABNORMAL HIGH (ref 38–126)
Anion gap: 5 (ref 5–15)
BUN: 25 mg/dL — ABNORMAL HIGH (ref 8–23)
CO2: 31 mmol/L (ref 22–32)
Calcium: 8.8 mg/dL — ABNORMAL LOW (ref 8.9–10.3)
Chloride: 106 mmol/L (ref 98–111)
Creatinine: 1.37 mg/dL — ABNORMAL HIGH (ref 0.61–1.24)
GFR, Estimated: 53 mL/min — ABNORMAL LOW (ref >60)
Glucose, Bld: 89 mg/dL (ref 70–99)
Potassium: 3.9 mmol/L (ref 3.5–5.1)
Sodium: 142 mmol/L (ref 135–145)
Total Bilirubin: 0.2 mg/dL — ABNORMAL LOW (ref 0.3–1.2)
Total Protein: 7.3 g/dL (ref 6.5–8.1)

## 2022-09-26 LAB — CBC WITH DIFFERENTIAL (CANCER CENTER ONLY)
Abs Immature Granulocytes: 0.01 10*3/uL (ref 0.00–0.07)
Basophils Absolute: 0 10*3/uL (ref 0.0–0.1)
Basophils Relative: 0 %
Eosinophils Absolute: 0.1 10*3/uL (ref 0.0–0.5)
Eosinophils Relative: 2 %
HCT: 33.6 % — ABNORMAL LOW (ref 39.0–52.0)
Hemoglobin: 10.9 g/dL — ABNORMAL LOW (ref 13.0–17.0)
Immature Granulocytes: 0 %
Lymphocytes Relative: 25 %
Lymphs Abs: 1.1 10*3/uL (ref 0.7–4.0)
MCH: 31.1 pg (ref 26.0–34.0)
MCHC: 32.4 g/dL (ref 30.0–36.0)
MCV: 95.7 fL (ref 80.0–100.0)
Monocytes Absolute: 0.6 10*3/uL (ref 0.1–1.0)
Monocytes Relative: 12 %
Neutro Abs: 2.8 10*3/uL (ref 1.7–7.7)
Neutrophils Relative %: 61 %
Platelet Count: 238 10*3/uL (ref 150–400)
RBC: 3.51 MIL/uL — ABNORMAL LOW (ref 4.22–5.81)
RDW: 14.2 % (ref 11.5–15.5)
WBC Count: 4.6 10*3/uL (ref 4.0–10.5)
nRBC: 0 % (ref 0.0–0.2)

## 2022-09-26 LAB — VITAMIN B12: Vitamin B-12: 1348 pg/mL — ABNORMAL HIGH (ref 180–914)

## 2022-09-26 LAB — FERRITIN: Ferritin: 18 ng/mL — ABNORMAL LOW (ref 24–336)

## 2022-09-26 LAB — LACTATE DEHYDROGENASE: LDH: 171 U/L (ref 98–192)

## 2022-09-26 NOTE — Progress Notes (Signed)
HEMATOLOGY ONCOLOGY CLINIC NOTE  Date of Service: 09/26/22    Patient Care Team: [name], FNP as PCP - General (Family Medicine) Croitoru, Dani Gobble, MD as PCP - Cardiology (Cardiology) [name] as Physician Assistant (Emergency Medicine) [name], MD as Consulting Physician (Orthopedic Surgery) [name], MD as Consulting Physician (Oncology) Helayne Seminole, MD as Consulting Physician (Otolaryngology) Edrick Kins, DPM as Consulting Physician (Podiatry)  CHIEF COMPLAINTS F/u for  Marginal Zone lymphoma    HISTORY OF PRESENTING ILLNESS:   Bryan [name] 76 y.o. male is here because of a referral from ENT [name] regarding a concern of MALT lymphoma.   He is accompanied today by 4 members of his family. The pt reports that he is doing well overall. The pt's PCP is [name]. The pt sees [name] for his Rheumatology. The pt went off of humira a month before his mass appeared two months. The pt takes Methotrexate 15 mg once each week for his rheumatoid arthritis. He notes that he took humira for 8-10 months, and hasn't taken it in about 3 months. He began taking prednisone '15mg'$  q day, 2 months ago.   He reports having high blood pressure and takes amlodipine, metoprolol for it. He reports having had back and neck surgery for discs.  He also notes having essential tremors in his hands for which he takes primidone.  He reports having run out of flomax. He has had some PTSD from his time in the [place] war.  He notes he has had drenching night sweats for the last 15 years that are of intermittent frequency. He denies any other medical issues.    He first noticed swelling of his left cheek about two months ago. He notes that it was "hard like a rock." He was placed on prednisone for about a month after going to the doctor, with some relief. He then had his needle Bx recorded below.   He reports gum pain that began this  morning, and also reports that his tongue feels as if it has bumps on it. He reports taking folic acid '1mg'$  each day.    On 10/29/17 the pt had a neck CT revealing Multifocal left parotid lesion. Multiple ill-defined enhancing nodules in the left parotid gland. Asymmetric enhancing lymph nodes in the left neck are not pathologically enlarged however given the asymmetry and the left parotid lesion, they could represent neoplastic spread of parotid malignancy or lymphoma. Tissue sampling recommended.  Of note prior to the patient's visit, pt has had a biopsy of his left parotid gland completed on 12/03/17 with results revealing Atypical lymphoid proliferation. 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 -   B cell clonality study was positive for clonality.  On review of systems, pt reports gum pain, fatigue (not recent), occasional night sweats, ankle pain, and denies abdominal pains, back pain, flank pain, leg swelling, swollen or painful joints beside ankles.   On PMHx the pt has had rheumatoid arthritis for 6 years and HTN. He notes that he has anemia and is a Acupuncturist Witness I- which he notes means he would not want to consider blood products  On Surgical Hx the pt had back surgery. On Social Hx the pt quit smoking in 1976 after smoking about 8 cigarettes each day for 15 years. He notes drinking ETOH  about once a week. He denies chemical or radiation exposure, except for agent orange.  On Family Hx the pt notes high blood pressure, DM, but denies autoimmune conditions, cancers, or blood disorders.    INTERVAL HISTORY:   Bryan [name] is here for a scheduled follow-up of his marginal zone lymphoma.   Patient was last seen by me on 09/26/2021 and was doing well overall.   Patient had back spine surgery in September 2023, which caused him to be anemic since he  lost blood. He complains of back pain even after the his surgery. He is currently going to PT.   He notes that his rheumatoid arthritis is the same since the last visit. He is still on Rituxan maintenance and states it is helping him.   He occasionally takes Vitamin-B supplement, but he is not sure if he is taking an Iron supplement. He is not taking Naproxen 500 mg anymore, but he is compliant with all of his other medications.   He denies fever, chills, abdominal pain, night sweats, and unexpected weight loss. He does complain of back pain and bilateral leg swellings.   REVIEW OF SYSTEMS:   .10 Point review of Systems was done is negative except as noted above.  MEDICAL HISTORY:  Past Medical History:  Diagnosis Date   Anemia    Arthritis    hands, knees, cervical area. Back pain. Rheumatoid arthritis- weekly injections.   BPH (benign prostatic hypertrophy)    Cancer (HCC)    Diabetes (HCC)    Essential tremor    GAD (generalized anxiety disorder) 12/05/2021   GERD (gastroesophageal reflux disease)    reports for indigestion he uses mustard    Hearing deficit    wears hearing aids bilateral   HTN (hypertension)    Obesity    OSA (obstructive sleep apnea)    PAF (paroxysmal atrial fibrillation) (HCC)    Refusal of blood transfusions as patient is Jehovah's Witness    Rheumatoid arthritis ([place])    Right bundle branch block    history of   Seizures ([place])    AS A CHILD. only esential tremors now.   Sleep apnea    cpap - settings at 9 per patient    Ulcer   JEHOVA's WITNESS  SURGICAL HISTORY: Past Surgical History:  Procedure Laterality Date   ABDOMINAL EXPOSURE N/A 07/30/2022   Procedure: ABDOMINAL EXPOSURE;  Surgeon: Marty Heck, MD;  Location: [facility];  Service: Vascular;  Laterality: N/A;   ANTERIOR CERVICAL DECOMP/DISCECTOMY FUSION N/A 03/30/2013   Procedure: ANTERIOR CERVICAL DECOMPRESSION/DISCECTOMY FUSION 2 LEVELS;  Surgeon: Otilio Connors, MD;  Location: [facility];  Service: Neurosurgery;  Laterality: N/A;  C4-5 C5-6 Anterior cervical decompression/diskectomy/fusion/Allograft/Plate   ANTERIOR LUMBAR FUSION N/A 07/30/2022   Procedure: Anterior Lumbar  Interbody Fusion  - Lumbar five-sacral one posterior augmentation with iliac screws and O- arm;  Surgeon: Kary Kos, MD;  Location: [facility];  Service: Neurosurgery;  Laterality: N/A;   CHOLECYSTECTOMY N/A 12/07/2013   Procedure: LAPAROSCOPIC CHOLECYSTECTOMY WITH INTRAOPERATIVE CHOLANGIOGRAM;  Surgeon: Adin Hector, MD;  [facility];  Service: General;  Laterality: N/A;   COLONOSCOPY     DECOMPRESSIVE LUMBAR LAMINECTOMY LEVEL 2 N/A 02/15/2015   Procedure: COMPLETE DECOMPRESSIVE LUMBAR LAMINECTOMY L4-L5/ FORAMINOTOMY TO L4 NERVE ROOT AND L5 NERVE ROOT BILATERALLY;  Surgeon: [name], MD;  [facility];  Service: Orthopedics;  Laterality: N/A;   EYE SURGERY     right, growth excision   LAMINECTOMY WITH  POSTERIOR LATERAL ARTHRODESIS LEVEL 1 N/A 07/30/2022   Procedure: LAMINECTOMY WITH POSTERIOR LATERAL ARTHRODESIS LUMBAR FIVE SACRAL ONE;  Surgeon: Kary Kos, MD;  Location: [facility];  Service: Neurosurgery;  Laterality: N/A;   LUMBAR LAMINECTOMY/DECOMPRESSION MICRODISCECTOMY Left 01/18/2016   Procedure:  DECOMPRESSION L4-L5 MICRODISCECTOMY L4-L5 ON LEFT FOR SPINAL STENOSIS;  Surgeon: [name], MD;  [facility];  Service: Orthopedics;  Laterality: Left;   PAROTIDECTOMY Left 01/24/2018   Procedure: INCISIONAL BIOPSY OF LEFT PAROTID    MASS;  Surgeon: Helayne Seminole, MD;  Location: [facility];  Service: ENT;  Laterality: Left;   SPINE SURGERY     TONSILLECTOMY     VASECTOMY     WRIST GANGLION EXCISION Left     SOCIAL HISTORY: Social History   Socioeconomic History   Marital status: Married    Spouse name: Not on file   Number of children: 2   Years of education: Not on file   Highest education level: Not on file  Occupational History   Occupation: bus driver    Employer:  RETIRED  Tobacco Use   Smoking status: Former    Packs/day: 0.50    Years: 14.00    Total pack years: 7.00    Types: Cigarettes    Quit date: 12/14/1972    Years since quitting: 49.8   Smokeless tobacco: Never  Vaping Use   Vaping Use: Never used  Substance and Sexual Activity   Alcohol use: Yes    Comment: occasionally   Drug use: No   Sexual activity: Yes    Birth control/protection: None  Other Topics Concern   Not on file  Social History Narrative   Lives with wife   Social Determinants of Health   Financial Resource Strain: Low Risk  (08/02/2022)   Overall Financial Resource Strain (CARDIA)    Difficulty of Paying Living Expenses: Not hard at all  Food Insecurity: Food Insecurity Present (07/30/2022)   Hunger Vital Sign    Worried About [place] in the Last Year: Sometimes true    Ran Out of Food in the Last Year: Sometimes true  Transportation Needs: No Transportation Needs (07/30/2022)   PRAPARE - Hydrologist (Medical): No    Lack of Transportation (Non-Medical): No  Physical Activity: Not on file  Stress: Not on file  Social Connections: Not on file  Intimate Partner Violence: Unknown (07/30/2022)   Humiliation, Afraid, Rape, and Kick questionnaire    Fear of Current or Ex-Partner: No    Emotionally Abused: No    Physically Abused: Not on file    Sexually Abused: Not on file    FAMILY HISTORY: Family History  Problem Relation Age of Onset   Colon cancer Maternal Grandfather    Kidney disease Brother    Hypertension Brother    Diabetes Brother    Stroke Maternal Grandmother    Diabetes Mother    Hypertension Mother    CAD Father        died of MI at age 64   Hypertension Father    Diabetes Paternal Aunt        x 4 aunts    ALLERGIES:  is allergic to terazosin, adalimumab, statins, ampicillin, lisinopril, and other.  MEDICATIONS:  Current Outpatient Medications  Medication Sig Dispense Refill   acetaminophen  (TYLENOL) 325 MG tablet Take 1-2 tablets (325-650 mg total) by mouth every 4 (four) hours as needed for mild pain.     cyclobenzaprine (  FLEXERIL) 5 MG tablet Take 1 tablet (5 mg total) by mouth every 8 (eight) hours as needed for muscle spasms. 30 tablet 0   dabigatran (PRADAXA) 150 MG CAPS capsule Take 150 mg by mouth 2 (two) times daily.     diltiazem (CARDIZEM CD) 120 MG 24 hr capsule Take 1 capsule (120 mg total) by mouth daily. 90 capsule 2   furosemide (LASIX) 20 MG tablet Take 1 tablet (20 mg total) by mouth daily. 90 tablet 2   HYDROcodone-acetaminophen (NORCO/VICODIN) 5-325 MG tablet Take 1-2 tablets by mouth every 4 (four) hours as needed for severe pain. 52 tablet 0   HYDROcodone-acetaminophen (NORCO/VICODIN) 5-325 MG tablet 1 tablet as needed Orally every 8 hrs for 30 days     hydroxychloroquine (PLAQUENIL) 200 MG tablet 1 tablet with food or milk Orally twice a day for 30 days     LACTOBACILLUS PO Take 1 capsule by mouth 2 (two) times daily.     methocarbamol (ROBAXIN) 500 MG tablet 1 tablet Orally two times a day     metoprolol succinate (TOPROL-XL) 100 MG 24 hr tablet TAKE 1 TABLET BY MOUTH ONCE DAILY WITH  OR  IMMEDIATELY  FOLLOWING  A  MEAL 90 tablet 2   metoprolol succinate (TOPROL-XL) 50 MG 24 hr tablet Oral for 90     Multiple Vitamin (MULTIVITAMIN WITH MINERALS) TABS tablet Take 1 tablet by mouth daily.     naproxen (NAPROSYN) 500 MG tablet 1 tablet with food or milk as needed Orally every 12 hrs     nitroGLYCERIN (NITROSTAT) 0.4 MG SL tablet Place 0.4 mg under the tongue every 5 (five) minutes as needed for chest pain.     pantoprazole (PROTONIX) 40 MG tablet Take 1 tablet (40 mg total) by mouth at bedtime. 30 tablet 0   predniSONE (DELTASONE) 5 MG tablet 1 tablet Orally Once a day for 30 day(s)     primidone (MYSOLINE) 50 MG tablet Take 150 mg by mouth 2 (two) times daily.     Tamsulosin HCl (FLOMAX) 0.4 MG CAPS Take 0.4 mg by mouth at bedtime.     No current  facility-administered medications for this visit.    PHYSICAL EXAMINATION: ECOG FS:1 - Symptomatic but completely ambulatory  Vitals:   09/26/22 1414  BP: (!) 160/65  Pulse: 64  Resp: 18  Temp: 98.1 F (36.7 C)  SpO2: 100%    Wt Readings from Last 3 Encounters:  09/26/22 216 lb 9.6 oz (98.2 kg)  08/03/22 220 lb 7.4 oz (100 kg)  07/30/22 205 lb (93 kg)   Body mass index is 36.04 kg/m.    [name] GENERAL:alert, in no acute distress and comfortable SKIN: no acute rashes, no significant lesions EYES: conjunctiva are pink and non-injected, sclera anicteric OROPHARYNX: MMM, no exudates, no oropharyngeal erythema or ulceration NECK: supple, no JVD LYMPH:  no palpable lymphadenopathy in the cervical, axillary or inguinal regions LUNGS: clear to auscultation b/l with normal respiratory effort HEART: regular rate & rhythm ABDOMEN:  normoactive bowel sounds , non tender, not distended. Extremity: no pedal edema PSYCH: alert & oriented x 3 with fluent speech NEURO: no focal motor/sensory deficits   LABS CBC Latest Ref Rng & Units 09/27/2021 09/26/2021  WBC 4.0 - 10.5 K/uL 4.5 4.4  Hemoglobin 13.0 - 17.0 g/dL 12.2(L) 12.2(L)  Hematocrit 39.0 - 52.0 % 37.5(L) 36.7(L)  Platelets 150 - 400 K/uL 242 234   CBC    Component Value Date/Time   WBC 4.6 09/26/2022 1337  WBC 5.4 08/09/2022 0630   RBC 3.51 (L) 09/26/2022 1337   HGB 10.9 (L) 09/26/2022 1337   HCT 33.6 (L) 09/26/2022 1337   HCT 42.7 12/19/2017 1416   PLT 238 09/26/2022 1337   MCV 95.7 09/26/2022 1337   MCV 94.0 05/09/2018 1805   MCH 31.1 09/26/2022 1337   MCHC 32.4 09/26/2022 1337   RDW 14.2 09/26/2022 1337   LYMPHSABS 1.1 09/26/2022 1337   MONOABS 0.6 09/26/2022 1337   EOSABS 0.1 09/26/2022 1337   BASOSABS 0.0 09/26/2022 1337    CMP Latest Ref Rng & Units 09/27/2021 09/26/2021  Glucose 70 - 99 mg/dL 76 97  BUN 8 - 23 mg/dL 21 22  Creatinine 0.61 - 1.24 mg/dL 1.35(H) 1.38(H)  Sodium 135 - 145 mmol/L 137 140   Potassium 3.5 - 5.1 mmol/L 4.4 3.7  Chloride 98 - 111 mmol/L 99 103  CO2 22 - 32 mmol/L 30 27  Calcium 8.9 - 10.3 mg/dL 8.3(L) 8.5(L)  Total Protein 6.5 - 8.1 g/dL 6.7 7.5  Total Bilirubin 0.3 - 1.2 mg/dL 0.9 <0.2(L)  Alkaline Phos 38 - 126 U/L 164(H) 184(H)  AST 15 - 41 U/L 25 19  ALT 0 - 44 U/L 19 16    LABORATORY DATA:       01/27/18 Flow Cytometry:   01/27/18 Parotid Gland Surgical Pathology   RADIOGRAPHIC STUDIES: I have personally reviewed the radiological images as listed and agreed with the findings in the report.  ASSESSMENT & PLAN:  Bryan [name] is a 76 y.o. male with    1.history of stage IIE Marginal-zone lymphoma involving the left parotid gland, left cervical LN and mediastinal lymph nodes. Currently in remission Hep C neg and Hep B -He completed 4 dose Rituxan 02/07/18-02/28/18.  LDH has normalized and the Parotid mass is no longer palpable. 04/22/18 PET/CT revealed complete metabolic response to therapy. No residual hypermetabolic mass or lymphadenopathy.  He has continued to be on Rituxan every 13 weeks by his rheumatologist for management of his rheumatoid arthritis but this is probably also serving as maintenance treatment for his marginal zone lymphoma.  2. Jehovas Witness- declines any use of blood products.  3. Rheumatoid Arthritis  Rituxan seems to have helped his RA as well. - off  MTX and prednisone and is currently on plaquenil per his rheumatologist, Dr. Amil Amen and PA Leafy Kindle.  -I encouraged him to be more active with at least walking and possibly start PT or [employer]ne[employer] program.  4. Low B12 levels B12 level still low at 209 -Increase to B12 SL 2087mg SL daily  5.  Mild iron deficiency Lab Results  Component Value Date   FERRITIN 167 10/10/2021    PLAN: -Discussed lab results from 09/26/2022 with the patient. Labs showed he is anemic with hemoglobin of 10.9 K, WBC of 4.6 K, and platelets of 238. CMP stable. He is Anemic due  to his recent back surgery in September 2023.  -Recommended B-12 complex and iron supplement.  -No lab or clinical evidence of progression of his marginal zone lymphoma at this time. -Continue f/u with Rheumatology for continued Rituxan for RA which will likely also benefit lymphoma control. -Will see back in 12 months with labs    FOLLOW UP: RTC with Dr KIrene Limbowith labs in 12 months  The total time spent in the appointment was 20 minutes* .  All of the patient's questions were answered with apparent satisfaction. The patient knows to call the clinic with any problems,  questions or concerns.   Sullivan Lone MD MS AAHIVMS [facility] Hematology/Oncology Physician [facility]  .*Total Encounter Time as defined by the Centers for Medicare and Medicaid Services includes, in addition to the face-to-face time of a patient visit (documented in the note above) non-face-to-face time: obtaining and reviewing outside history, ordering and reviewing medications, tests or procedures, care coordination (communications with other health care professionals or caregivers) and documentation in the medical record.   Zettie Cooley, am acting as a Education administrator for Sullivan Lone, MD.

## 2022-09-27 ENCOUNTER — Encounter: Payer: Self-pay | Admitting: Gastroenterology

## 2022-10-09 ENCOUNTER — Ambulatory Visit (INDEPENDENT_AMBULATORY_CARE_PROVIDER_SITE_OTHER): Payer: No Typology Code available for payment source

## 2022-10-09 ENCOUNTER — Encounter (HOSPITAL_COMMUNITY): Payer: Self-pay

## 2022-10-09 ENCOUNTER — Ambulatory Visit (HOSPITAL_COMMUNITY)
Admission: EM | Admit: 2022-10-09 | Discharge: 2022-10-09 | Disposition: A | Discharge status: Home / Self Care | Payer: No Typology Code available for payment source | Attending: Family Medicine | Admitting: Family Medicine

## 2022-10-09 DIAGNOSIS — R0602 Shortness of breath: Secondary | ICD-10-CM

## 2022-10-09 DIAGNOSIS — R062 Wheezing: Secondary | ICD-10-CM | POA: Diagnosis not present

## 2022-10-09 DIAGNOSIS — J069 Acute upper respiratory infection, unspecified: Secondary | ICD-10-CM

## 2022-10-09 DIAGNOSIS — I10 Essential (primary) hypertension: Secondary | ICD-10-CM | POA: Diagnosis not present

## 2022-10-09 MED ORDER — PREDNISONE 20 MG PO TABS: 40.0000 mg | ORAL_TABLET | Freq: Every day | ORAL | 0 refills | Status: DC

## 2022-10-09 NOTE — ED Provider Notes (Signed)
[facility]   [id number] 10/09/22 Arrival Time: 0962  ASSESSMENT & PLAN:  1. Viral URI with cough   2. Wheezing   3. Elevated blood pressure reading in office with diagnosis of hypertension    No resp distress. I have personally viewed the imaging studies ordered this visit. No acute changes on CXR. No signs of PNA. Discussed.  Continue medications Rx by [place]. Begin: Meds ordered this encounter  Medications   predniSONE (DELTASONE) 20 MG tablet    Sig: Take 2 tablets (40 mg total) by mouth daily.    Dispense:  10 tablet    Refill:  0   OTC as needed.  Recommend:  Follow-up Information     [name], FNP.   Specialty: Family Medicine Why: If worsening or failing to improve as anticipated. Contact information: [place] [address] [phone]                 Reviewed expectations re: course of current medical issues. Questions answered. Outlined signs and symptoms indicating need for more acute intervention. Patient verbalized understanding. After Visit Summary given.  SUBJECTIVE: History from: patient.  Bryan [name] is a 76 y.o. male who presents with complaint of {Blank single:22868} {Symptoms; asthma w/none:19190}. Onset {abrupt, gradual:20671}, {$Time; disease onset (duration):[phone]}. Triggers: {Causes; aggravating factors asthma:60767}. Describes wheezing as {DEGREE - MILD, MOD, SEV:20224} when present. Fever: {YES/NO/WILD CARDS:18581}. Overall normal PO intake without n/v. Sick contacts: {YES/NO/WILD TWSFK:81275}. Ambulatory without difficulty. No LE edema. Typically his asthma is {control:20434}. Inhaler use: ***. OTC treatment: ***.  Social History   Tobacco Use  Smoking Status Former   Packs/day: 0.50   Years: 14.00   Total pack years: 7.00   Types: Cigarettes   Quit date: 12/14/1972   Years since quitting: 49.8  Smokeless Tobacco Never   Increased blood pressure noted today. Reports that he is  treated for HTN. He reports {htn cvs ros:315727::"no chest pain on exertion","no dyspnea on exertion","no swelling of ankles","no orthostatic dizziness or lightheadedness","no orthopnea or paroxysmal nocturnal dyspnea","no palpitations","no intermittent claudication symptoms"}.  OBJECTIVE:  Vitals:   10/09/22 1129 10/09/22 1144  BP: (!) 173/96   Pulse: 81 62  Resp: 18   Temp: 98.9 F (37.2 C)   TempSrc: Oral   SpO2: (!) 68% 95%    General appearance: alert; NAD HEENT: [place]; AT; *** nasal congestion Neck: supple without LAD Cv: RRR without murmer Lungs: unlabored respirations, {Mild/Moderate/Severe:20260} {DESC; LEFT/RIGHT/BI:19206} expiratory wheezing; cough: {Present / absent with mmm:15961}; no significant respiratory distress Skin: warm and dry Psychological: alert and cooperative; normal mood and affect  Imaging: DG Chest 2 View  Result Date: 10/09/2022 CLINICAL DATA:  Shortness of breath. Cold symptoms with headache since last week. EXAM: CHEST - 2 VIEW COMPARISON:  Radiographs 04/23/2022.  CT 10/09/2021. FINDINGS: The heart size and mediastinal contours are stable with a heart size at the upper limits of normal. The lungs appear clear. There is no pleural effusion or pneumothorax. No acute osseous findings are demonstrated. Postsurgical changes in the cervical and lumbar spine are partially imaged. IMPRESSION: Stable chest. No acute cardiopulmonary process. Electronically Signed   By: Richardean Sale M.D.   On: 10/09/2022 12:04    Allergies  Allergen Reactions   Terazosin Other (See Comments)    Hypotension   Adalimumab Other (See Comments)    (Humira) Lymphoma   Statins Other (See Comments)    Memory issues   Ampicillin Nausea Only    Tolerated ancef on  07-30-22 without issue intraoperativelty   Lisinopril Cough   Other Other (See Comments)    BLOOD PRODUCT REFUSAL (patient is a Sales promotion account executive Witness)     Past Medical History:  Diagnosis Date   Anemia    Arthritis     hands, knees, cervical area. Back pain. Rheumatoid arthritis- weekly injections.   BPH (benign prostatic hypertrophy)    Cancer (HCC)    Diabetes (HCC)    Essential tremor    GAD (generalized anxiety disorder) 12/05/2021   GERD (gastroesophageal reflux disease)    reports for indigestion he uses mustard    Hearing deficit    wears hearing aids bilateral   HTN (hypertension)    Obesity    OSA (obstructive sleep apnea)    PAF (paroxysmal atrial fibrillation) (HCC)    Refusal of blood transfusions as patient is Jehovah's Witness    Rheumatoid arthritis ([place])    Right bundle branch block    history of   Seizures ([place])    AS A CHILD. only esential tremors now.   Sleep apnea    cpap - settings at 9 per patient    Ulcer    Family History  Problem Relation Age of Onset   Colon cancer Maternal Grandfather    Kidney disease Brother    Hypertension Brother    Diabetes Brother    Stroke Maternal Grandmother    Diabetes Mother    Hypertension Mother    CAD Father        died of MI at age 9   Hypertension Father    Diabetes Paternal Aunt        x 4 aunts   Social History   Socioeconomic History   Marital status: Married    Spouse name: Not on file   Number of children: 2   Years of education: Not on file   Highest education level: Not on file  Occupational History   Occupation: bus driver    Employer: RETIRED  Tobacco Use   Smoking status: Former    Packs/day: 0.50    Years: 14.00    Total pack years: 7.00    Types: Cigarettes    Quit date: 12/14/1972    Years since quitting: 49.8   Smokeless tobacco: Never  Vaping Use   Vaping Use: Never used  Substance and Sexual Activity   Alcohol use: Yes    Comment: occasionally   Drug use: No   Sexual activity: Yes    Birth control/protection: None  Other Topics Concern   Not on file  Social History Narrative   Lives with wife   Social Determinants of Health   Financial Resource Strain: Low Risk  (08/02/2022)   Overall  Financial Resource Strain (CARDIA)    Difficulty of Paying Living Expenses: Not hard at all  Food Insecurity: Food Insecurity Present (07/30/2022)   Hunger Vital Sign    Worried About [place] in the Last Year: Sometimes true    Ran Out of Food in the Last Year: Sometimes true  Transportation Needs: No Transportation Needs (07/30/2022)   PRAPARE - Hydrologist (Medical): No    Lack of Transportation (Non-Medical): No  Physical Activity: Not on file  Stress: Not on file  Social Connections: Not on file  Intimate Partner Violence: Unknown (07/30/2022)   Humiliation, Afraid, Rape, and Kick questionnaire    Fear of Current or Ex-Partner: No    Emotionally Abused: No    Physically Abused:  Not on file    Sexually Abused: Not on file

## 2022-10-09 NOTE — Discharge Instructions (Signed)
Your blood pressure was noted to be elevated during your visit today. If you are currently taking medication for high blood pressure, please ensure you are taking this as directed. If you do not have a history of high blood pressure and your blood pressure remains persistently elevated, you may need to begin taking a medication at some point. You may return here within the next few days to recheck if unable to see your primary care provider or if you do not have a one.  BP (!) 173/96 (BP Location: Right Arm)   Pulse 62   Temp 98.9 F (37.2 C) (Oral)   Resp 18   SpO2 95%   BP Readings from Last 3 Encounters:  10/09/22 (!) 173/96  09/26/22 (!) 160/65  08/10/22 (!) 140/74

## 2022-10-09 NOTE — ED Triage Notes (Signed)
Pt reports last Wednesday he had a cold and headache. [facility]

## 2022-10-16 ENCOUNTER — Encounter (HOSPITAL_COMMUNITY): Payer: Self-pay

## 2022-10-16 ENCOUNTER — Ambulatory Visit (HOSPITAL_BASED_OUTPATIENT_CLINIC_OR_DEPARTMENT_OTHER): Admit: 2022-10-16 | Discharge: 2022-10-16 | Disposition: A | Discharge status: Home / Self Care | Payer: Medicare HMO

## 2022-10-16 ENCOUNTER — Ambulatory Visit (HOSPITAL_COMMUNITY)
Admission: EM | Admit: 2022-10-16 | Discharge: 2022-10-16 | Disposition: A | Discharge status: Home / Self Care | Payer: Medicare HMO | Attending: Internal Medicine | Admitting: Internal Medicine

## 2022-10-16 DIAGNOSIS — Z86718 Personal history of other venous thrombosis and embolism: Secondary | ICD-10-CM | POA: Insufficient documentation

## 2022-10-16 DIAGNOSIS — R52 Pain, unspecified: Secondary | ICD-10-CM | POA: Diagnosis not present

## 2022-10-16 DIAGNOSIS — M79604 Pain in right leg: Secondary | ICD-10-CM | POA: Diagnosis not present

## 2022-10-16 DIAGNOSIS — M7989 Other specified soft tissue disorders: Secondary | ICD-10-CM | POA: Insufficient documentation

## 2022-10-16 MED ORDER — ACETAMINOPHEN 325 MG PO TABS
975.0000 mg | ORAL_TABLET | Freq: Once | ORAL | Status: AC
  Administered 2022-10-16: 975 mg via ORAL

## 2022-10-16 MED ORDER — ACETAMINOPHEN 325 MG PO TABS
ORAL_TABLET | ORAL | Status: AC
  Filled 2022-10-16: qty 3

## 2022-10-16 NOTE — ED Triage Notes (Signed)
Pt presents with complaints of right thigh swelling x 2 days. Denies any injury. Pedal pulses present and strong.

## 2022-10-16 NOTE — Discharge Instructions (Signed)
You have been scheduled for a vascular ultrasound of the right leg at [facility] in the radiology department at 11:30 AM.  Go to [facility] emergency department and tell them that you have a vascular ultrasound for DVT rule out ordered from urgent care.  Do not check into the ER.  Someone from radiology will come to get you to complete the study.   I will call you with your results if you have a blood clot.  Otherwise, I would like for you to take Tylenol for the pain. Call your primary care doctor for follow-up appointment in the next few days if symptoms fail to improve. Elevate your lower extremities to reduce swelling.   If you develop any new or worsening symptoms or do not improve in the next 2 to 3 days, please return.  If your symptoms are severe, please go to the emergency room.  Follow-up with your primary care provider for further evaluation and management of your symptoms as well as ongoing wellness visits.  I hope you feel better!

## 2022-10-16 NOTE — ED Provider Notes (Signed)
[place]    CSN: [id number] Arrival date & time: 10/16/22  2542      History   Chief Complaint Chief Complaint  Patient presents with   Leg Swelling    HPI Bryan [name] is a 76 y.o. male.   Patient presents urgent care for evaluation of right thigh swelling and pain with standing that started 2 days ago.  Movement, walking, and standing make pain worse.  Sitting down makes pain better but pain does not completely resolve when sitting or with cessation of movement.  Denies recent falls, trauma, or injuries to the area.  He states the area has felt slightly warm over the last couple of days but denies redness, rash, calf pain, and fever/chills.  No shortness of breath, chest pain, nausea, vomiting, or dizziness.  History of DVT to the right lower extremity as well as "clots in the lungs".  He is not currently taking any anticoagulation.  Recently underwent surgical procedure to the lumbar spine 3 months ago but has not had any new problems with his back since.  History of malignancy as well.  No recent long car travel or long periods of sitting.  Recently placed on steroid burst due to viral upper respiratory infection about 1 week ago but has since finished this.  Symptoms started after he finished the steroid burst for viral URI/bronchitis.  He has not attempted use of any over-the-counter medications prior to arrival urgent care for symptoms.      Past Medical History:  Diagnosis Date   Anemia    Arthritis    hands, knees, cervical area. Back pain. Rheumatoid arthritis- weekly injections.   BPH (benign prostatic hypertrophy)    Cancer (HCC)    Diabetes (HCC)    Essential tremor    GAD (generalized anxiety disorder) 12/05/2021   GERD (gastroesophageal reflux disease)    reports for indigestion he uses mustard    Hearing deficit    wears hearing aids bilateral   HTN (hypertension)    Obesity    OSA (obstructive sleep apnea)    PAF (paroxysmal atrial  fibrillation) (HCC)    Refusal of blood transfusions as patient is Jehovah's Witness    Rheumatoid arthritis ([place])    Right bundle branch block    history of   Seizures ([place])    AS A CHILD. only esential tremors now.   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 ([place]) 06/27/2022   Overweight 06/27/2022   Exposure to Agent Orange 06/27/2022   Hand pain 06/27/2022   Pain in right knee 06/27/2022   Pseudoarthrosis of lumbar spine 06/27/2022   Sensorineural hearing loss, bilateral 06/27/2022   Tremor 06/27/2022   Unilateral primary osteoarthritis, right knee 06/27/2022   Benign essential hypertension 06/27/2022   Hypertensive disorder 06/27/2022   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   Atrial fibrillation ([place]) 06/27/2022   Other persistent atrial fibrillation ([place]) 06/27/2022   Sleep apnea, unspecified  06/27/2022   Obstructive sleep apnea syndrome 06/27/2022   Obstructive sleep apnea (adult) (pediatric) 06/27/2022   Cervical radiculopathy 06/27/2022   Cervical spinal stenosis 06/27/2022   Radiculopathy, lumbar region 06/27/2022   Encounter for other administrative examinations 06/27/2022   Preoperative cardiovascular examination 06/27/2022   Degeneration of lumbar or lumbosacral intervertebral disc 06/27/2022   Pain in joint involving shoulder region 06/27/2022   Diarrhea, unspecified 06/27/2022   Encounter for fitting and adjustment of  hearing aid 06/27/2022   Dyspnea on exertion 02/20/2022   Acute on chronic diastolic CHF (congestive heart failure) ([place]) 01/20/2022   History of pulmonary embolism 01/20/2022   Atrial fibrillation with RVR ([place]) 01/19/2022   OSA on CPAP 12/05/2021   GAD (generalized anxiety disorder) 12/05/2021   History of Hodgkin's lymphoma 12/05/2021   Hypoglycemia 12/05/2021   MALT (mucosa associated lymphoid tissue) ([place]) 12/05/2021   Mixed hyperlipidemia 12/05/2021   Multiple pulmonary emboli ([place]) 12/05/2021   Normocytic anemia 12/05/2021   OA (osteoarthritis) 12/05/2021   Primary hypertension 12/05/2021   Chronic pain syndrome 12/05/2021   Paroxysmal atrial flutter ([place]) 12/05/2021   Severe sepsis ([place]) 10/09/2021   New onset atrial fibrillation ([place]) 10/09/2021   Acute blood loss anemia 10/09/2021   Spinal stenosis of lumbar region 10/02/2021   Abnormal stress test 04/27/2020   Coronary artery disease involving native coronary artery of native heart without angina pectoris 04/27/2020   Irregular heart beat 04/27/2020   Spondylolisthesis at L4-L5 level 12/25/2019   Lumbar post-laminectomy syndrome 08/13/2019   Lumbar spondylosis 08/13/2019   DDD (degenerative disc disease), lumbar 01/16/2019   Lumbar radiculopathy 09/25/2018   Precordial pain 07/30/2018   Elevated troponin    Pain 02/25/2018   Marginal zone lymphoma ([place]) 02/02/2018   Lymphoma ([place]) 01/24/2018   Parotid mass 10/28/2017   Arthralgia of right lower leg 10/10/2017   Chronic low back pain 04/16/2017   Class 2 severe obesity due to excess calories with serious comorbidity and body mass index (BMI) of 39.0 to 39.9 in adult ([place]) 03/07/2017   ACE-inhibitor cough 03/07/2017   Lumbago 06/21/2016   Spinal stenosis, lumbar region, with neurogenic claudication 02/15/2015   Obesity (BMI 30-39.9) 12/07/2013   Refusal of blood transfusions as patient is Jehovah's Witness 12/07/2013   Cervical spondylosis without myelopathy  04/27/2013   Rheumatoid arthritis ([place]) 10/26/2012   Nonspecific abnormal finding in stool contents 07/28/2012   Multiple joint pain 06/05/2012   History of tobacco use-  06/05/2012   Asthmatic bronchitis 12/15/2011   BPH (benign prostatic hyperplasia) 12/15/2011   Essential tremor 12/15/2011   Essential hypertension 12/15/2011    Past Surgical History:  Procedure Laterality Date   ABDOMINAL EXPOSURE N/A 07/30/2022   Procedure: ABDOMINAL EXPOSURE;  Surgeon: Marty Heck, MD;  Location: [place];  Service: Vascular;  Laterality: N/A;   ANTERIOR CERVICAL DECOMP/DISCECTOMY FUSION N/A 03/30/2013   Procedure: ANTERIOR CERVICAL DECOMPRESSION/DISCECTOMY FUSION 2 LEVELS;  Surgeon: Otilio Connors, MD;  Location: [facility];  Service: Neurosurgery;  Laterality: N/A;  C4-5 C5-6 Anterior cervical decompression/diskectomy/fusion/Allograft/Plate   ANTERIOR LUMBAR FUSION N/A 07/30/2022   Procedure: Anterior Lumbar  Interbody Fusion  - Lumbar five-sacral one posterior augmentation with iliac screws and O- arm;  Surgeon: Kary Kos, MD;  Location: [facility];  Service: Neurosurgery;  Laterality: N/A;   CHOLECYSTECTOMY N/A 12/07/2013   Procedure: LAPAROSCOPIC CHOLECYSTECTOMY WITH INTRAOPERATIVE CHOLANGIOGRAM;  Surgeon: Adin Hector, MD;  Location: [facility];  Service: General;  Laterality: N/A;   COLONOSCOPY  DECOMPRESSIVE LUMBAR LAMINECTOMY LEVEL 2 N/A 02/15/2015   Procedure: COMPLETE DECOMPRESSIVE LUMBAR LAMINECTOMY L4-L5/ FORAMINOTOMY TO L4 NERVE ROOT AND L5 NERVE ROOT BILATERALLY;  Surgeon: [name], MD;  Location: [facility];  Service: Orthopedics;  Laterality: N/A;   EYE SURGERY     right, growth excision   LAMINECTOMY WITH POSTERIOR LATERAL ARTHRODESIS LEVEL 1 N/A 07/30/2022   Procedure: LAMINECTOMY WITH POSTERIOR LATERAL ARTHRODESIS LUMBAR FIVE SACRAL ONE;  Surgeon: Kary Kos, MD;  Location: [facility];  Service: Neurosurgery;  Laterality: N/A;   LUMBAR LAMINECTOMY/DECOMPRESSION MICRODISCECTOMY Left  01/18/2016   Procedure:  DECOMPRESSION L4-L5 MICRODISCECTOMY L4-L5 ON LEFT FOR SPINAL STENOSIS;  Surgeon: [name], MD;  Location: [facility];  Service: Orthopedics;  Laterality: Left;   PAROTIDECTOMY Left 01/24/2018   Procedure: INCISIONAL BIOPSY OF LEFT PAROTID    MASS;  Surgeon: Helayne Seminole, MD;  Location: [place];  Service: ENT;  Laterality: Left;   SPINE SURGERY     TONSILLECTOMY     VASECTOMY     WRIST GANGLION EXCISION Left        Home Medications    Prior to Admission medications   Medication Sig Start Date End Date Taking? Authorizing Provider  acetaminophen (TYLENOL) 325 MG tablet Take 1-2 tablets (325-650 mg total) by mouth every 4 (four) hours as needed for mild pain. 08/09/22   Love, Ivan Anchors, PA-C  cyclobenzaprine (FLEXERIL) 5 MG tablet Take 1 tablet (5 mg total) by mouth every 8 (eight) hours as needed for muscle spasms. 08/09/22   Love, Ivan Anchors, PA-C  dabigatran (PRADAXA) 150 MG CAPS capsule Take 150 mg by mouth 2 (two) times daily.    [provider]  diltiazem (CARDIZEM CD) 120 MG 24 hr capsule Take 1 capsule (120 mg total) by mouth daily. 05/11/22   Cantwell, Celeste C, PA-C  furosemide (LASIX) 20 MG tablet Take 1 tablet (20 mg total) by mouth daily. 05/11/22   Cantwell, Celeste C, PA-C  HYDROcodone-acetaminophen (NORCO/VICODIN) 5-325 MG tablet Take 1-2 tablets by mouth every 4 (four) hours as needed for severe pain. 08/09/22   Love, Ivan Anchors, PA-C  HYDROcodone-acetaminophen (NORCO/VICODIN) 5-325 MG tablet 1 tablet as needed Orally every 8 hrs for 30 days 09/09/20   [provider]  hydroxychloroquine (PLAQUENIL) 200 MG tablet 1 tablet with food or milk Orally twice a day for 30 days    [provider]  LACTOBACILLUS PO Take 1 capsule by mouth 2 (two) times daily.    [provider]  methocarbamol (ROBAXIN) 500 MG tablet 1 tablet Orally two times a day    [provider]  metoprolol succinate (TOPROL-XL) 100 MG 24 hr tablet  TAKE 1 TABLET BY MOUTH ONCE DAILY WITH  OR  IMMEDIATELY  FOLLOWING  A  MEAL 05/10/20   Patwardhan, Manish J, MD  metoprolol succinate (TOPROL-XL) 50 MG 24 hr tablet Oral for 90    [provider]  Multiple Vitamin (MULTIVITAMIN WITH MINERALS) TABS tablet Take 1 tablet by mouth daily.    [provider]  naproxen (NAPROSYN) 500 MG tablet 1 tablet with food or milk as needed Orally every 12 hrs    [provider]  nitroGLYCERIN (NITROSTAT) 0.4 MG SL tablet Place 0.4 mg under the tongue every 5 (five) minutes as needed for chest pain.    [provider]  pantoprazole (PROTONIX) 40 MG tablet Take 1 tablet (40 mg total) by mouth at bedtime. 08/09/22   Love, Ivan Anchors, PA-C  predniSONE (DELTASONE) 20  MG tablet Take 2 tablets (40 mg total) by mouth daily. 10/09/22   Vanessa Kick, MD  predniSONE (DELTASONE) 5 MG tablet 1 tablet Orally Once a day for 30 day(s)    [provider]  primidone (MYSOLINE) 50 MG tablet Take 150 mg by mouth 2 (two) times daily.    [provider]  Tamsulosin HCl (FLOMAX) 0.4 MG CAPS Take 0.4 mg by mouth at bedtime.    [provider]    Family History Family History  Problem Relation Age of Onset   Colon cancer Maternal Grandfather    Kidney disease Brother    Hypertension Brother    Diabetes Brother    Stroke Maternal Grandmother    Diabetes Mother    Hypertension Mother    CAD Father        died of MI at age 41   Hypertension Father    Diabetes Paternal Aunt        x 4 aunts    Social History Social History   Tobacco Use   Smoking status: Former    Packs/day: 0.50    Years: 14.00    Total pack years: 7.00    Types: Cigarettes    Quit date: 12/14/1972    Years since quitting: 49.8   Smokeless tobacco: Never  Vaping Use   Vaping Use: Never used  Substance Use Topics   Alcohol use: Yes    Comment: occasionally   Drug use: No     Allergies   Terazosin, Adalimumab, Statins, Ampicillin,  Lisinopril, and Other   Review of Systems Review of Systems Per HPI  Physical Exam Triage Vital Signs ED Triage Vitals  Enc Vitals Group     BP 10/16/22 1041 (!) 140/54     Pulse Rate 10/16/22 1041 (!) 58     Resp 10/16/22 1041 18     Temp 10/16/22 1041 98 F (36.7 C)     Temp src --      SpO2 10/16/22 1041 97 %     Weight --      Height --      Head Circumference --      Peak Flow --      Pain Score 10/16/22 1040 7     Pain Loc --      Pain Edu? --      Excl. in [place]? --    No data found.  Updated Vital Signs BP (!) 140/54   Pulse (!) 58   Temp 98 F (36.7 C)   Resp 18   SpO2 97%   Visual Acuity Right Eye Distance:   Left Eye Distance:   Bilateral Distance:    Right Eye Near:   Left Eye Near:    Bilateral Near:     Physical Exam Vitals and nursing note reviewed.  Constitutional:      Appearance: He is not ill-appearing or toxic-appearing.  HENT:     Head: Normocephalic and atraumatic.     Right Ear: Hearing and external ear normal.     Left Ear: Hearing and external ear normal.     Nose: Nose normal.     Mouth/Throat:     Lips: Pink.  Eyes:     General: Lids are normal. Vision grossly intact. Gaze aligned appropriately.     Extraocular Movements: Extraocular movements intact.     Conjunctiva/sclera: Conjunctivae normal.  Cardiovascular:     Rate and Rhythm: Normal rate and regular rhythm.     Heart sounds:  Normal heart sounds, S1 normal and S2 normal.  Pulmonary:     Effort: Pulmonary effort is normal. No respiratory distress.     Breath sounds: Normal breath sounds and air entry.  Musculoskeletal:     Cervical back: Neck supple.     Comments: Ambulatory with steady gait.  Nonpitting edema/swelling to the right thigh as compared to the left thigh with inspection.  No ecchymosis, warmth, redness, rash, obvious deformity, or tenderness to palpation of the generalized right lower extremity/right thigh.  Negative Homan signs bilaterally.  +2 popliteal  pulses bilaterally.  Sensation intact to bilateral lower extremities.  Skin:    General: Skin is warm and dry.     Capillary Refill: Capillary refill takes less than 2 seconds.     Findings: No rash.  Neurological:     General: No focal deficit present.     Mental Status: He is alert and oriented to person, place, and time. Mental status is at baseline.     Cranial Nerves: No dysarthria or facial asymmetry.  Psychiatric:        Mood and Affect: Mood normal.        Speech: Speech normal.        Behavior: Behavior normal.        Thought Content: Thought content normal.        Judgment: Judgment normal.      UC Treatments / Results  Labs (all labs ordered are listed, but only abnormal results are displayed) Labs Reviewed - No data to display  EKG   Radiology No results found.  Procedures Procedures (including critical care time)  Medications Ordered in UC Medications  acetaminophen (TYLENOL) tablet 975 mg (975 mg Oral Given 10/16/22 1119)    Initial Impression / Assessment and Plan / UC Course  I have reviewed the triage vital signs and the nursing notes.  Pertinent labs & imaging results that were available during my care of the patient were reviewed by me and considered in my medical decision making (see chart for details).   1.  Leg swelling, history of blood clots, right leg pain Unclear etiology of patient's symptoms.  No pain at the bilateral sciatic notches.  Leg pain is likely musculoskeletal in nature, however unable to rule out acute deep vein thrombosis based on patient's history and physical exam findings of obvious swelling to the right thigh as compared to the left thigh.  Vascular ultrasound to rule out DVT at [facility] radiology department scheduled for 11:30 AM this morning.  Patient given dose of Tylenol prior to discharge from urgent care with plan to go over to [facility] to have this procedure performed to rule out DVT.  Should pain be  musculoskeletal in nature and not DVT, patient may take Tylenol 1000 mg every 6 hours as needed for discomfort.  He is to elevate the bilateral lower extremities to reduce swelling and irritation.  PCP follow-up in the next week or 2 recommended.  Will call patient with results of vascular study as soon as available.  Deferred imaging of the chest and ED referral based on stable cardiopulmonary exam and hemodynamically stable vital signs at time of visit.  Discussed physical exam and available lab work findings in clinic with patient.  Counseled patient regarding appropriate use of medications and potential side effects for all medications recommended or prescribed today. Discussed red flag signs and symptoms of worsening condition,when to call the PCP office, return to urgent care, and when to  seek higher level of care in the emergency department. Patient verbalizes understanding and agreement with plan. All questions answered. Patient discharged in stable condition.    Final Clinical Impressions(s) / UC Diagnoses   Final diagnoses:  Leg swelling  History of blood clots  Right leg pain     Discharge Instructions      You have been scheduled for a vascular ultrasound of the right leg at [facility] in the radiology department at 11:30 AM.  Go to [facility] emergency department and tell them that you have a vascular ultrasound for DVT rule out ordered from urgent care.  Do not check into the ER.  Someone from radiology will come to get you to complete the study.   I will call you with your results if you have a blood clot.  Otherwise, I would like for you to take Tylenol for the pain. Call your primary care doctor for follow-up appointment in the next few days if symptoms fail to improve. Elevate your lower extremities to reduce swelling.   If you develop any new or worsening symptoms or do not improve in the next 2 to 3 days, please return.  If your symptoms are severe, please go  to the emergency room.  Follow-up with your primary care provider for further evaluation and management of your symptoms as well as ongoing wellness visits.  I hope you feel better!     ED Prescriptions   None    PDMP not reviewed this encounter.   Talbot Grumbling, [place] 10/16/22 1123

## 2022-10-24 ENCOUNTER — Ambulatory Visit: Payer: Self-pay | Admitting: Cardiology

## 2022-10-24 NOTE — Progress Notes (Signed)
Error

## 2022-10-31 ENCOUNTER — Ambulatory Visit: Payer: No Typology Code available for payment source | Admitting: Gastroenterology

## 2022-10-31 NOTE — Progress Notes (Incomplete)
Colonoscopy September 2013 (Dr. Deatra Ina) Moderate diverticulosis in the ascending and descending colon  He saw his gastroenterologist at the [facility] earlier this month for symptoms of seepage which were attributed to overflow incontinence.  He was recommended to take daily fiber supplements and MiraLAX.

## 2022-11-15 ENCOUNTER — Encounter (HOSPITAL_COMMUNITY): Payer: Self-pay

## 2022-11-15 ENCOUNTER — Ambulatory Visit (HOSPITAL_COMMUNITY)
Admission: EM | Admit: 2022-11-15 | Discharge: 2022-11-15 | Disposition: A | Discharge status: Home / Self Care | Payer: No Typology Code available for payment source | Attending: Family Medicine | Admitting: Family Medicine

## 2022-11-15 DIAGNOSIS — I10 Essential (primary) hypertension: Secondary | ICD-10-CM

## 2022-11-15 DIAGNOSIS — M7918 Myalgia, other site: Secondary | ICD-10-CM

## 2022-11-15 MED ORDER — HYDROCODONE-ACETAMINOPHEN 5-325 MG PO TABS: 1.0000 | ORAL_TABLET | Freq: Four times a day (QID) | ORAL | 0 refills | Status: DC | PRN

## 2022-11-15 MED ORDER — PREDNISONE 20 MG PO TABS: 40.0000 mg | ORAL_TABLET | Freq: Every day | ORAL | 0 refills | Status: DC

## 2022-11-15 NOTE — Discharge Instructions (Addendum)
Be aware, you have been prescribed pain medications that may cause drowsiness. While taking this medication, do not take any other medications containing acetaminophen (Tylenol). Do not combine with alcohol or recreational drugs. Please do not drive, operate heavy machinery, or take part in activities that require making important decisions while on this medication as your judgement may be clouded.  Your blood pressure was noted to be elevated during your visit today. If you are currently taking medication for high blood pressure, please ensure you are taking this as directed. If you do not have a history of high blood pressure and your blood pressure remains persistently elevated, you may need to begin taking a medication at some point. You may return here within the next few days to recheck if unable to see your primary care provider or if you do not have a one.  BP (!) 188/76 (BP Location: Right Arm)   Pulse 89   Temp 98.4 F (36.9 C) (Oral)   Resp 16   SpO2 95%   BP Readings from Last 3 Encounters:  11/15/22 (!) 188/76  10/16/22 (!) 140/54  10/09/22 (!) 173/96

## 2022-11-15 NOTE — ED Provider Notes (Signed)
[name]   [id number] 11/15/22 Arrival Time: 7846  ASSESSMENT & PLAN:  1. Left buttock pain   2. Elevated blood pressure reading in office with diagnosis of hypertension    Question sciatic related. H/O back problems. Able to ambulate here and hemodynamically stable. No indication for imaging of back at this time given no trauma and normal neurological exam. Discussed.  Trial of: Meds ordered this encounter  Medications   predniSONE (DELTASONE) 20 MG tablet    Sig: Take 2 tablets (40 mg total) by mouth daily.    Dispense:  10 tablet    Refill:  0   HYDROcodone-acetaminophen (NORCO/VICODIN) 5-325 MG tablet    Sig: Take 1 tablet by mouth every 6 (six) hours as needed for moderate pain or severe pain.    Dispense:  8 tablet    Refill:  0   Medication sedation precautions given. Encourage ROM/movement as tolerated.    Discharge Instructions      Be aware, you have been prescribed pain medications that may cause drowsiness. While taking this medication, do not take any other medications containing acetaminophen (Tylenol). Do not combine with alcohol or recreational drugs. Please do not drive, operate heavy machinery, or take part in activities that require making important decisions while on this medication as your judgement may be clouded.  Your blood pressure was noted to be elevated during your visit today. If you are currently taking medication for high blood pressure, please ensure you are taking this as directed. If you do not have a history of high blood pressure and your blood pressure remains persistently elevated, you may need to begin taking a medication at some point. You may return here within the next few days to recheck if unable to see your primary care provider or if you do not have a one.  BP (!) 188/76 (BP Location: Right Arm)   Pulse 89   Temp 98.4 F (36.9 C) (Oral)   Resp 16   SpO2 95%   BP Readings from Last 3 Encounters:  11/15/22 (!)  188/76  10/16/22 (!) 140/54  10/09/22 (!) 173/96        Recommend:  Follow-up Information     Schedule an appointment as soon as possible for a visit  with Bryan [name]., FNP.   Specialty: Family Medicine Why: For follow up and to recheck your blood pressure. Contact information: [place] [address] [place].   Specialty: Emergency Medicine Why: If symptoms worsen in any way. Contact information: [address] [id number] [place] [phone]                Reviewed expectations re: course of current medical issues. Questions answered. Outlined signs and symptoms indicating need for more acute intervention. Patient verbalized understanding. After Visit Summary given.   SUBJECTIVE: History from: patient.  Bryan [name] is a 77 y.o. male who presents with complaint of pain in the left hipbuttock; abrupt onset today; h/o similar on R. Normal bowel/bladder habits. Ambulatory. No extremity sensation changes or weakness No tx PTA. Increased blood pressure noted today. Reports that he is treated for HTN. He reports taking medications as instructed, no chest pain on exertion, and no dyspnea on exertion.  OBJECTIVE:  Vitals:   11/15/22 1800  BP: (!) 188/76  Pulse: 89  Resp: 16  Temp: 98.4 F (36.9 C)  TempSrc: Oral  SpO2: 95%    General appearance: alert; no distress HEENT: [place]; AT Neck: supple with FROM; without midline tenderness CV: regular Lungs: unlabored respirations; speaks full sentences without difficulty Abdomen: soft, non-tender; non-distended Back: moderate and poorly localized tenderness to palpation over LEFT buttock ; FROM at waist; without midline tenderness Extremities: without edema; symmetrical without gross deformities; normal ROM of bilateral LE Skin: warm and dry Neurologic: normal gait; normal sensation and  strength of bilateral LE Psychological: alert and cooperative; normal mood and affect  Allergies  Allergen Reactions   Terazosin Other (See Comments)    Hypotension   Adalimumab Other (See Comments)    (Humira) Lymphoma   Statins Other (See Comments)    Memory issues   Ampicillin Nausea Only    Tolerated ancef on 07-30-22 without issue intraoperativelty   Lisinopril Cough   Other Other (See Comments)    BLOOD PRODUCT REFUSAL (patient is a Sales promotion account executive Witness)     Past Medical History:  Diagnosis Date   Anemia    Arthritis    hands, knees, cervical area. Back pain. Rheumatoid arthritis- weekly injections.   BPH (benign prostatic hypertrophy)    Cancer (HCC)    Diabetes (HCC)    Essential tremor    GAD (generalized anxiety disorder) 12/05/2021   GERD (gastroesophageal reflux disease)    reports for indigestion he uses mustard    Hearing deficit    wears hearing aids bilateral   HTN (hypertension)    Obesity    OSA (obstructive sleep apnea)    PAF (paroxysmal atrial fibrillation) (HCC)    Refusal of blood transfusions as patient is Jehovah's Witness    Rheumatoid arthritis ([place])    Right bundle branch block    history of   Seizures ([place])    AS A CHILD. only esential tremors now.   Sleep apnea    cpap - settings at 9 per patient    Ulcer    Social History   Socioeconomic History   Marital status: Married    Spouse name: Not on file   Number of children: 2   Years of education: Not on file   Highest education level: Not on file  Occupational History   Occupation: bus driver    Employer: RETIRED  Tobacco Use   Smoking status: Former    Packs/day: 0.50    Years: 14.00    Total pack years: 7.00    Types: Cigarettes    Quit date: 12/14/1972    Years since quitting: 49.9   Smokeless tobacco: Never  Vaping Use   Vaping Use: Never used  Substance and Sexual Activity   Alcohol use: Yes    Comment: occasionally   Drug use: No   Sexual activity: Yes    Birth  control/protection: None  Other Topics Concern   Not on file  Social History Narrative   Lives with wife   Social Determinants of Health   Financial Resource Strain: Low Risk  (08/02/2022)   Overall Financial Resource Strain (CARDIA)    Difficulty of Paying Living Expenses: Not hard at all  Food Insecurity: Food Insecurity Present (07/30/2022)   Hunger Vital Sign    Worried About [place] in the Last Year: Sometimes true    Ran Out of Food in the Last Year: Sometimes true  Transportation Needs: No Transportation Needs (07/30/2022)   PRAPARE - Hydrologist (Medical): No    Lack of Transportation (Non-Medical): No  Physical Activity:  Not on file  Stress: Not on file  Social Connections: Not on file  Intimate Partner Violence: Unknown (07/30/2022)   Humiliation, Afraid, Rape, and Kick questionnaire    Fear of Current or Ex-Partner: No    Emotionally Abused: No    Physically Abused: Not on file    Sexually Abused: Not on file   Family History  Problem Relation Age of Onset   Colon cancer Maternal Grandfather    Kidney disease Brother    Hypertension Brother    Diabetes Brother    Stroke Maternal Grandmother    Diabetes Mother    Hypertension Mother    CAD Father        died of MI at age 58   Hypertension Father    Diabetes Paternal Aunt        x 4 aunts   Past Surgical History:  Procedure Laterality Date   ABDOMINAL EXPOSURE N/A 07/30/2022   Procedure: ABDOMINAL EXPOSURE;  Surgeon: Marty Heck, MD;  Location: [facility];  Service: Vascular;  Laterality: N/A;   ANTERIOR CERVICAL DECOMP/DISCECTOMY FUSION N/A 03/30/2013   Procedure: ANTERIOR CERVICAL DECOMPRESSION/DISCECTOMY FUSION 2 LEVELS;  Surgeon: Otilio Connors, MD;  Location: [facility];  Service: Neurosurgery;  Laterality: N/A;  C4-5 C5-6 Anterior cervical decompression/diskectomy/fusion/Allograft/Plate   ANTERIOR LUMBAR FUSION N/A 07/30/2022   Procedure: Anterior Lumbar   Interbody Fusion  - Lumbar five-sacral one posterior augmentation with iliac screws and O- arm;  Surgeon: Kary Kos, MD;  Location: [facility];  Service: Neurosurgery;  Laterality: N/A;   CHOLECYSTECTOMY N/A 12/07/2013   Procedure: LAPAROSCOPIC CHOLECYSTECTOMY WITH INTRAOPERATIVE CHOLANGIOGRAM;  Surgeon: Adin Hector, MD;  Location: [facility];  Service: General;  Laterality: N/A;   COLONOSCOPY     DECOMPRESSIVE LUMBAR LAMINECTOMY LEVEL 2 N/A 02/15/2015   Procedure: COMPLETE DECOMPRESSIVE LUMBAR LAMINECTOMY L4-L5/ FORAMINOTOMY TO L4 NERVE ROOT AND L5 NERVE ROOT BILATERALLY;  Surgeon: [name], MD;  Location: [facility];  Service: Orthopedics;  Laterality: N/A;   EYE SURGERY     right, growth excision   LAMINECTOMY WITH POSTERIOR LATERAL ARTHRODESIS LEVEL 1 N/A 07/30/2022   Procedure: LAMINECTOMY WITH POSTERIOR LATERAL ARTHRODESIS LUMBAR FIVE SACRAL ONE;  Surgeon: Kary Kos, MD;  Location: [facility];  Service: Neurosurgery;  Laterality: N/A;   LUMBAR LAMINECTOMY/DECOMPRESSION MICRODISCECTOMY Left 01/18/2016   Procedure:  DECOMPRESSION L4-L5 MICRODISCECTOMY L4-L5 ON LEFT FOR SPINAL STENOSIS;  Surgeon: [name], MD;  Location: [facility];  Service: Orthopedics;  Laterality: Left;   PAROTIDECTOMY Left 01/24/2018   Procedure: INCISIONAL BIOPSY OF LEFT PAROTID    MASS;  Surgeon: Helayne Seminole, MD;  Location: [facility];  Service: ENT;  Laterality: Left;   [place] Left       Vanessa Kick, MD 11/15/22 [phone]

## 2022-11-15 NOTE — ED Triage Notes (Signed)
Chief Complaint: pain in the left hip. Random onset with no falls or injuries. History of arthritis.   Onset: this morning   Prescriptions or OTC medications tried: No

## 2022-12-05 ENCOUNTER — Other Ambulatory Visit: Payer: Self-pay

## 2022-12-05 ENCOUNTER — Ambulatory Visit (HOSPITAL_COMMUNITY)
Admission: EM | Admit: 2022-12-05 | Discharge: 2022-12-05 | Disposition: A | Discharge status: Home / Self Care | Payer: Medicare PPO | Attending: Internal Medicine | Admitting: Internal Medicine

## 2022-12-05 ENCOUNTER — Encounter (HOSPITAL_COMMUNITY): Payer: Self-pay | Admitting: Emergency Medicine

## 2022-12-05 DIAGNOSIS — R300 Dysuria: Secondary | ICD-10-CM | POA: Diagnosis not present

## 2022-12-05 DIAGNOSIS — R3129 Other microscopic hematuria: Secondary | ICD-10-CM | POA: Diagnosis not present

## 2022-12-05 DIAGNOSIS — R39198 Other difficulties with micturition: Secondary | ICD-10-CM | POA: Insufficient documentation

## 2022-12-05 LAB — POCT URINALYSIS DIPSTICK, ED / UC
Bilirubin Urine: NEGATIVE
Glucose, UA: NEGATIVE mg/dL
Ketones, ur: NEGATIVE mg/dL
Nitrite: POSITIVE — AB
Protein, ur: 100 mg/dL — AB
Specific Gravity, Urine: 1.02 (ref 1.005–1.030)
Urobilinogen, UA: 0.2 mg/dL (ref 0.0–1.0)
pH: 5.5 (ref 5.0–8.0)

## 2022-12-05 MED ORDER — CIPROFLOXACIN HCL 500 MG PO TABS: 500.0000 mg | ORAL_TABLET | Freq: Two times a day (BID) | ORAL | 0 refills | Status: AC

## 2022-12-05 NOTE — ED Provider Notes (Signed)
[place]    CSN: [id number] Arrival date & time: 12/05/22  8657      History   Chief Complaint No chief complaint on file.   HPI Bryan [name] is a 77 y.o. male.   77 year old male presents with burning with urination and bad odor to urine that started yesterday. Also having some right lower back pain and possible fever. Denies any nausea or vomiting. Having decreased urine stream and output. Some lower abdominal discomfort. Last UTI over 5 years ago. Has not taken any medication yet for symptoms. Has history of HTN, CHF, A fib, lymphoma, type 2 DM, BPH and currently on Toprol, Cardizem, Lasix, Pradaxa, and Flomax daily. Also has history of Rheumatoid arthritis Essential tremor and currently on Plaquenil and Mysoline. Chronic back pain and history of Laminectomy and takes Flexeril and Vicodin daily.   The history is provided by the patient.    Past Medical History:  Diagnosis Date   Anemia    Arthritis    hands, knees, cervical area. Back pain. Rheumatoid arthritis- weekly injections.   BPH (benign prostatic hypertrophy)    Cancer (HCC)    Diabetes (HCC)    Essential tremor    GAD (generalized anxiety disorder) 12/05/2021   GERD (gastroesophageal reflux disease)    reports for indigestion he uses mustard    Hearing deficit    wears hearing aids bilateral   HTN (hypertension)    Obesity    OSA (obstructive sleep apnea)    PAF (paroxysmal atrial fibrillation) (HCC)    Refusal of blood transfusions as patient is Jehovah's Witness    Rheumatoid arthritis ([place])    Right bundle branch block    history of   Seizures ([place])    AS A CHILD. only esential tremors now.   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 ([place]) 06/27/2022   Overweight 06/27/2022   Exposure to Agent Orange 06/27/2022   Hand pain 06/27/2022   Pain in right knee 06/27/2022   Pseudoarthrosis of lumbar spine 06/27/2022   Sensorineural hearing loss, bilateral 06/27/2022   Tremor 06/27/2022   Unilateral primary osteoarthritis, right knee 06/27/2022   Benign essential hypertension 06/27/2022   Hypertensive disorder 06/27/2022   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/2022   Atrial fibrillation ([place]) 06/27/2022   Other persistent atrial fibrillation ([place]) 06/27/2022   Sleep apnea, unspecified 06/27/2022   Obstructive sleep apnea syndrome 06/27/2022   Obstructive sleep apnea (adult) (pediatric) 06/27/2022   Cervical radiculopathy 06/27/2022   Cervical spinal stenosis 06/27/2022   Radiculopathy, lumbar region 06/27/2022   Encounter for other administrative examinations 06/27/2022   Preoperative cardiovascular examination 06/27/2022   Degeneration of lumbar or lumbosacral intervertebral disc 06/27/2022   Pain in joint involving shoulder region 06/27/2022   Diarrhea, unspecified 06/27/2022   Encounter for fitting and adjustment of hearing aid 06/27/2022   Dyspnea on exertion 02/20/2022   Acute on chronic diastolic CHF (congestive heart failure) (  [place]) 01/20/2022   History of pulmonary embolism 01/20/2022   Atrial fibrillation with RVR ([place]) 01/19/2022   OSA on CPAP 12/05/2021   GAD (generalized anxiety disorder) 12/05/2021   History of Hodgkin's lymphoma 12/05/2021   Hypoglycemia 12/05/2021   MALT (mucosa associated lymphoid tissue) ([place]) 12/05/2021   Mixed hyperlipidemia 12/05/2021   Multiple pulmonary emboli ([name]) 12/05/2021   Normocytic anemia  12/05/2021   OA (osteoarthritis) 12/05/2021   Primary hypertension 12/05/2021   Chronic pain syndrome 12/05/2021   Paroxysmal atrial flutter ([place]) 12/05/2021   Severe sepsis ([place]) 10/09/2021   New onset atrial fibrillation ([place]) 10/09/2021   Acute blood loss anemia 10/09/2021   Spinal stenosis of lumbar region 10/02/2021   Abnormal stress test 04/27/2020   Coronary artery disease involving native coronary artery of native heart without angina pectoris 04/27/2020   Irregular heart beat 04/27/2020   Spondylolisthesis at L4-L5 level 12/25/2019   Lumbar post-laminectomy syndrome 08/13/2019   Lumbar spondylosis 08/13/2019   DDD (degenerative disc disease), lumbar 01/16/2019   Lumbar radiculopathy 09/25/2018   Precordial pain 07/30/2018   Elevated troponin    Pain 02/25/2018   Marginal zone lymphoma ([place]) 02/02/2018   Lymphoma ([place]) 01/24/2018   Parotid mass 10/28/2017   Arthralgia of right lower leg 10/10/2017   Chronic low back pain 04/16/2017   Class 2 severe obesity due to excess calories with serious comorbidity and body mass index (BMI) of 39.0 to 39.9 in adult ([place]) 03/07/2017   ACE-inhibitor cough 03/07/2017   Lumbago 06/21/2016   Spinal stenosis, lumbar region, with neurogenic claudication 02/15/2015   Obesity (BMI 30-39.9) 12/07/2013   Refusal of blood transfusions as patient is Jehovah's Witness 12/07/2013   Cervical spondylosis without myelopathy 04/27/2013   Rheumatoid arthritis ([place]) 10/26/2012   Nonspecific abnormal finding in stool contents 07/28/2012   Multiple joint pain 06/05/2012   History of tobacco use-  06/05/2012   Asthmatic bronchitis 12/15/2011   BPH (benign prostatic hyperplasia) 12/15/2011   Essential tremor 12/15/2011   Essential hypertension 12/15/2011    Past Surgical History:  Procedure Laterality Date   ABDOMINAL EXPOSURE N/A 07/30/2022   Procedure: ABDOMINAL EXPOSURE;  Surgeon: Marty Heck, MD;  Location: [facility];  Service: Vascular;   Laterality: N/A;   ANTERIOR CERVICAL DECOMP/DISCECTOMY FUSION N/A 03/30/2013   Procedure: ANTERIOR CERVICAL DECOMPRESSION/DISCECTOMY FUSION 2 LEVELS;  Surgeon: Otilio Connors, MD;  Location: [facility];  Service: Neurosurgery;  Laterality: N/A;  C4-5 C5-6 Anterior cervical decompression/diskectomy/fusion/Allograft/Plate   ANTERIOR LUMBAR FUSION N/A 07/30/2022   Procedure: Anterior Lumbar  Interbody Fusion  - Lumbar five-sacral one posterior augmentation with iliac screws and O- arm;  Surgeon: Kary Kos, MD;  Location: [facility];  Service: Neurosurgery;  Laterality: N/A;   CHOLECYSTECTOMY N/A 12/07/2013   Procedure: LAPAROSCOPIC CHOLECYSTECTOMY WITH INTRAOPERATIVE CHOLANGIOGRAM;  Surgeon: Adin Hector, MD;  Location: [facility];  Service: General;  Laterality: N/A;   COLONOSCOPY     DECOMPRESSIVE LUMBAR LAMINECTOMY LEVEL 2 N/A 02/15/2015   Procedure: COMPLETE DECOMPRESSIVE LUMBAR LAMINECTOMY L4-L5/ FORAMINOTOMY TO L4 NERVE ROOT AND L5 NERVE ROOT BILATERALLY;  Surgeon: [name], MD;  Location: [facility];  Service: Orthopedics;  Laterality: N/A;   EYE SURGERY     right, growth excision   LAMINECTOMY WITH POSTERIOR LATERAL ARTHRODESIS LEVEL 1 N/A 07/30/2022   Procedure: LAMINECTOMY WITH POSTERIOR LATERAL ARTHRODESIS LUMBAR FIVE SACRAL ONE;  Surgeon: Kary Kos, MD;  Location: [facility];  Service: Neurosurgery;  Laterality: N/A;   LUMBAR LAMINECTOMY/DECOMPRESSION MICRODISCECTOMY Left 01/18/2016  Procedure:  DECOMPRESSION L4-L5 MICRODISCECTOMY L4-L5 ON LEFT FOR SPINAL STENOSIS;  Surgeon: [name], MD;  Location: [facility];  Service: Orthopedics;  Laterality: Left;   PAROTIDECTOMY Left 01/24/2018   Procedure: INCISIONAL BIOPSY OF LEFT PAROTID    MASS;  Surgeon: Helayne Seminole, MD;  Location: [facility];  Service: ENT;  Laterality: Left;   SPINE SURGERY     TONSILLECTOMY     VASECTOMY     WRIST GANGLION EXCISION Left        Home Medications    Prior to Admission medications   Medication Sig Start Date  End Date Taking? Authorizing Provider  ciprofloxacin (CIPRO) 500 MG tablet Take 1 tablet (500 mg total) by mouth every 12 (twelve) hours for 7 days. 12/05/22 12/12/22 Yes Alesi Zachery, Nicholes Stairs, NP  acetaminophen (TYLENOL) 325 MG tablet Take 1-2 tablets (325-650 mg total) by mouth every 4 (four) hours as needed for mild pain. 08/09/22   Love, Ivan Anchors, PA-C  cyclobenzaprine (FLEXERIL) 5 MG tablet Take 1 tablet (5 mg total) by mouth every 8 (eight) hours as needed for muscle spasms. 08/09/22   Love, Ivan Anchors, PA-C  dabigatran (PRADAXA) 150 MG CAPS capsule Take 150 mg by mouth 2 (two) times daily.    [provider]  diltiazem (CARDIZEM CD) 120 MG 24 hr capsule Take 1 capsule (120 mg total) by mouth daily. 05/11/22   Cantwell, Celeste C, PA-C  furosemide (LASIX) 20 MG tablet Take 1 tablet (20 mg total) by mouth daily. 05/11/22   Cantwell, Celeste C, PA-C  HYDROcodone-acetaminophen (NORCO/VICODIN) 5-325 MG tablet Take 1 tablet by mouth every 6 (six) hours as needed for moderate pain or severe pain. 11/15/22   Vanessa Kick, MD  hydroxychloroquine (PLAQUENIL) 200 MG tablet 1 tablet with food or milk Orally twice a day for 30 days    [provider]  LACTOBACILLUS PO Take 1 capsule by mouth 2 (two) times daily.    [provider]  methocarbamol (ROBAXIN) 500 MG tablet 1 tablet Orally two times a day    [provider]  metoprolol succinate (TOPROL-XL) 100 MG 24 hr tablet TAKE 1 TABLET BY MOUTH ONCE DAILY WITH  OR  IMMEDIATELY  FOLLOWING  A  MEAL 05/10/20   Patwardhan, Manish J, MD  metoprolol succinate (TOPROL-XL) 50 MG 24 hr tablet Oral for 90    [provider]  Multiple Vitamin (MULTIVITAMIN WITH MINERALS) TABS tablet Take 1 tablet by mouth daily.    [provider]  naproxen (NAPROSYN) 500 MG tablet 1 tablet with food or milk as needed Orally every 12 hrs    [provider]  nitroGLYCERIN (NITROSTAT) 0.4 MG SL tablet Place 0.4 mg under the tongue every 5  (five) minutes as needed for chest pain.    [provider]  pantoprazole (PROTONIX) 40 MG tablet Take 1 tablet (40 mg total) by mouth at bedtime. 08/09/22   Love, Ivan Anchors, PA-C  primidone (MYSOLINE) 50 MG tablet Take 150 mg by mouth 2 (two) times daily.    [provider]  Tamsulosin HCl (FLOMAX) 0.4 MG CAPS Take 0.4 mg by mouth at bedtime.    [provider]    Family History Family History  Problem Relation Age of Onset   Colon cancer Maternal Grandfather    Kidney disease Brother    Hypertension Brother    Diabetes Brother    Stroke Maternal Grandmother    Diabetes Mother    Hypertension Mother  CAD Father        died of MI at age 53   Hypertension Father    Diabetes Paternal Aunt        x 4 aunts    Social History Social History   Tobacco Use   Smoking status: Former    Packs/day: 0.50    Years: 14.00    Total pack years: 7.00    Types: Cigarettes    Quit date: 12/14/1972    Years since quitting: 50.0   Smokeless tobacco: Never  Vaping Use   Vaping Use: Never used  Substance Use Topics   Alcohol use: Yes    Comment: occasionally   Drug use: No     Allergies   Terazosin, Adalimumab, Statins, Ampicillin, Lisinopril, and Other   Review of Systems Review of Systems  Constitutional:  Positive for fatigue and fever. Negative for activity change and appetite change.  Respiratory:  Negative for chest tightness and shortness of breath.   Gastrointestinal:  Positive for abdominal pain (slightly lower). Negative for diarrhea and vomiting.  Genitourinary:  Positive for decreased urine volume, difficulty urinating, dysuria, flank pain (right) and frequency. Negative for genital sores, hematuria, penile discharge, penile pain and testicular pain.  Musculoskeletal:  Positive for arthralgias, back pain and myalgias.  Skin:  Negative for color change and rash.  Allergic/Immunologic: Positive for immunocompromised state.  Neurological:  Positive  for tremors. Negative for seizures, syncope, speech difficulty and light-headedness.  Hematological:  Negative for adenopathy. Bruises/bleeds easily.     Physical Exam Triage Vital Signs ED Triage Vitals  Enc Vitals Group     BP 12/05/22 1026 108/71     Pulse Rate 12/05/22 1026 78     Resp 12/05/22 1026 20     Temp 12/05/22 1026 99.5 F (37.5 C)     Temp Source 12/05/22 1026 Oral     SpO2 12/05/22 1026 95 %     Weight --      Height --      Head Circumference --      Peak Flow --      Pain Score 12/05/22 1023 10     Pain Loc --      Pain Edu? --      Excl. in [place]? --    No data found.  Updated Vital Signs BP 108/71 (BP Location: Right Arm) Comment (BP Location): large cuff  Pulse 78   Temp 99.5 F (37.5 C) (Oral)   Resp 20   SpO2 95%   Visual Acuity Right Eye Distance:   Left Eye Distance:   Bilateral Distance:    Right Eye Near:   Left Eye Near:    Bilateral Near:     Physical Exam Vitals and nursing note reviewed.  Constitutional:      General: He is awake. He is not in acute distress.    Appearance: He is well-developed.     Comments: He is sitting in the exam chair in no acute distress but appears in pain.   HENT:     Head: Normocephalic and atraumatic.     Right Ear: Decreased hearing noted.     Left Ear: Decreased hearing noted.     Ears:     Comments: Wears bilateral hearing aids Eyes:     Extraocular Movements: Extraocular movements intact.     Conjunctiva/sclera: Conjunctivae normal.  Cardiovascular:     Rate and Rhythm: Normal rate. Rhythm irregular.  Pulmonary:     Effort: Pulmonary effort  is normal. No tachypnea, accessory muscle usage or respiratory distress.     Breath sounds: Normal breath sounds and air entry. No decreased air movement. No decreased breath sounds, wheezing, rhonchi or rales.  Abdominal:     General: There is no distension.     Palpations: Abdomen is soft.     Tenderness: There is abdominal tenderness in the suprapubic  area. There is no right CVA tenderness, left CVA tenderness, guarding or rebound.       Comments: Mid suprapubic tenderness present. No CVA tenderness.   Genitourinary:    Comments: Did not perform genital exam.  Skin:    General: Skin is warm and dry.     Capillary Refill: Capillary refill takes less than 2 seconds.     Findings: No rash.  Neurological:     General: No focal deficit present.     Mental Status: He is alert and oriented to person, place, and time.  Psychiatric:        Mood and Affect: Mood normal.        Behavior: Behavior normal. Behavior is cooperative.      UC Treatments / Results  Labs (all labs ordered are listed, but only abnormal results are displayed) Labs Reviewed  POCT URINALYSIS DIPSTICK, ED / UC - Abnormal; Notable for the following components:      Result Value   Hgb urine dipstick SMALL (*)    Protein, ur 100 (*)    Nitrite POSITIVE (*)    Leukocytes,Ua MODERATE (*)    All other components within normal limits  URINE CULTURE    EKG   Radiology No results found.  Procedures Procedures (including critical care time)  Medications Ordered in UC Medications - No data to display  Initial Impression / Assessment and Plan / UC Course  I have reviewed the triage vital signs and the nursing notes.  Pertinent labs & imaging results that were available during my care of the patient were reviewed by me and considered in my medical decision making (see chart for details).     Reviewed urinalysis results with patient- positive WBC's, nitrites, blood and protein- probable UTI. No distinct localized pain indicating renal calculi but may consider if urine output does not improve. Sent urine for culture. Patient is immunocompromised with allergy to Ampicillin - will avoid Amoxicillin, Rocephin and Keflex for now. Patient is stable and should be able to be managed in an outpatient setting at this time but continue to monitor. Will start Cipro '500mg'$  twice  a day for 7 days- may change or discontinue pending urine culture results. Continue to push water and fluids. May take Tylenol or Vicodin every 6 hours as needed for pain. Continue other current medications as prescribed. If any worsening of pain occurs, unable to urinate in the next 12-18 hours, go to the ER ASAP. Otherwise, follow-up pending urine culture results.  Final Clinical Impressions(s) / UC Diagnoses   Final diagnoses:  Dysuria  Decreased urine stream  Other microscopic hematuria     Discharge Instructions      Recommend start Ciprofloxacin '500mg'$  twice a day for 7 days. Continue to push water and fluids. May take Tylenol '500mg'$  every 6 hours as needed for pain. Continue other medications as prescribed. If any worsening of pain occurs or unable to urinate in the next 12-18 hours, go to the ER ASAP. Otherwise, follow-up pending urine culture results.     ED Prescriptions     Medication Sig Dispense Auth. Provider  ciprofloxacin (CIPRO) 500 MG tablet Take 1 tablet (500 mg total) by mouth every 12 (twelve) hours for 7 days. 14 tablet Jmichael Gille, Nicholes Stairs, NP      PDMP not reviewed this encounter.   Katy Apo, NP 12/05/22 2106

## 2022-12-05 NOTE — ED Triage Notes (Signed)
Symptoms started yesterday.  Complains of burning with urination, difficulty getting urinary stream started and reports odor to urine.  Reports right lower back pain  Has not had any medications at home for symptoms

## 2022-12-05 NOTE — Discharge Instructions (Addendum)
Recommend start Ciprofloxacin '500mg'$  twice a day for 7 days. Continue to push water and fluids. May take Tylenol '500mg'$  every 6 hours as needed for pain. Continue other medications as prescribed. If any worsening of pain occurs or unable to urinate in the next 12-18 hours, go to the ER ASAP. Otherwise, follow-up pending urine culture results.

## 2022-12-07 LAB — URINE CULTURE: Culture: >=100000 — AB

## 2023-04-24 ENCOUNTER — Encounter (HOSPITAL_COMMUNITY): Payer: Self-pay

## 2023-04-24 ENCOUNTER — Ambulatory Visit (HOSPITAL_COMMUNITY)
Admission: EM | Admit: 2023-04-24 | Discharge: 2023-04-24 | Disposition: A | Discharge status: Home / Self Care | Payer: No Typology Code available for payment source | Attending: Internal Medicine | Admitting: Internal Medicine

## 2023-04-24 ENCOUNTER — Emergency Department (HOSPITAL_COMMUNITY)
Admission: EM | Admit: 2023-04-24 | Discharge: 2023-04-25 | Disposition: A | Discharge status: Home / Self Care | Payer: No Typology Code available for payment source | Attending: Emergency Medicine | Admitting: Emergency Medicine

## 2023-04-24 DIAGNOSIS — I2510 Atherosclerotic heart disease of native coronary artery without angina pectoris: Secondary | ICD-10-CM | POA: Insufficient documentation

## 2023-04-24 DIAGNOSIS — E119 Type 2 diabetes mellitus without complications: Secondary | ICD-10-CM | POA: Diagnosis not present

## 2023-04-24 DIAGNOSIS — F17200 Nicotine dependence, unspecified, uncomplicated: Secondary | ICD-10-CM | POA: Insufficient documentation

## 2023-04-24 DIAGNOSIS — Z8572 Personal history of non-Hodgkin lymphomas: Secondary | ICD-10-CM | POA: Insufficient documentation

## 2023-04-24 DIAGNOSIS — Z79899 Other long term (current) drug therapy: Secondary | ICD-10-CM | POA: Insufficient documentation

## 2023-04-24 DIAGNOSIS — I5033 Acute on chronic diastolic (congestive) heart failure: Secondary | ICD-10-CM | POA: Insufficient documentation

## 2023-04-24 DIAGNOSIS — J45909 Unspecified asthma, uncomplicated: Secondary | ICD-10-CM | POA: Diagnosis not present

## 2023-04-24 DIAGNOSIS — R197 Diarrhea, unspecified: Secondary | ICD-10-CM

## 2023-04-24 DIAGNOSIS — K5792 Diverticulitis of intestine, part unspecified, without perforation or abscess without bleeding: Secondary | ICD-10-CM

## 2023-04-24 DIAGNOSIS — K5732 Diverticulitis of large intestine without perforation or abscess without bleeding: Secondary | ICD-10-CM | POA: Diagnosis not present

## 2023-04-24 DIAGNOSIS — I110 Hypertensive heart disease with heart failure: Secondary | ICD-10-CM | POA: Diagnosis not present

## 2023-04-24 DIAGNOSIS — R1030 Lower abdominal pain, unspecified: Secondary | ICD-10-CM | POA: Diagnosis present

## 2023-04-24 DIAGNOSIS — R109 Unspecified abdominal pain: Secondary | ICD-10-CM | POA: Diagnosis not present

## 2023-04-24 LAB — CBC
HCT: 38.4 % — ABNORMAL LOW (ref 39.0–52.0)
Hemoglobin: 12 g/dL — ABNORMAL LOW (ref 13.0–17.0)
MCH: 30.5 pg (ref 26.0–34.0)
MCHC: 31.3 g/dL (ref 30.0–36.0)
MCV: 97.7 fL (ref 80.0–100.0)
Platelets: 265 10*3/uL (ref 150–400)
RBC: 3.93 MIL/uL — ABNORMAL LOW (ref 4.22–5.81)
RDW: 14.5 % (ref 11.5–15.5)
WBC: 13.1 10*3/uL — ABNORMAL HIGH (ref 4.0–10.5)
nRBC: 0 % (ref 0.0–0.2)

## 2023-04-24 LAB — COMPREHENSIVE METABOLIC PANEL
ALT: 13 U/L (ref 0–44)
AST: 15 U/L (ref 15–41)
Albumin: 3.1 g/dL — ABNORMAL LOW (ref 3.5–5.0)
Alkaline Phosphatase: 124 U/L (ref 38–126)
Anion gap: 11 (ref 5–15)
BUN: 23 mg/dL (ref 8–23)
CO2: 25 mmol/L (ref 22–32)
Calcium: 8.7 mg/dL — ABNORMAL LOW (ref 8.9–10.3)
Chloride: 102 mmol/L (ref 98–111)
Creatinine, Ser: 1.66 mg/dL — ABNORMAL HIGH (ref 0.61–1.24)
GFR, Estimated: 42 mL/min — ABNORMAL LOW (ref >60)
Glucose, Bld: 90 mg/dL (ref 70–99)
Potassium: 4.3 mmol/L (ref 3.5–5.1)
Sodium: 138 mmol/L (ref 135–145)
Total Bilirubin: 0.6 mg/dL (ref 0.3–1.2)
Total Protein: 7.2 g/dL (ref 6.5–8.1)

## 2023-04-24 LAB — URINALYSIS, ROUTINE W REFLEX MICROSCOPIC
Bilirubin Urine: NEGATIVE
Glucose, UA: NEGATIVE mg/dL
Ketones, ur: NEGATIVE mg/dL
Nitrite: NEGATIVE
Protein, ur: 100 mg/dL — AB
Specific Gravity, Urine: 1.017 (ref 1.005–1.030)
WBC, UA: >50 WBC/hpf (ref 0–5)
pH: 5 (ref 5.0–8.0)

## 2023-04-24 LAB — POCT URINALYSIS DIP (MANUAL ENTRY)
Bilirubin, UA: NEGATIVE
Glucose, UA: NEGATIVE mg/dL
Nitrite, UA: NEGATIVE
Protein Ur, POC: 100 mg/dL — AB
Spec Grav, UA: 1.025 (ref 1.010–1.025)
Urobilinogen, UA: 0.2 E.U./dL
pH, UA: 6 (ref 5.0–8.0)

## 2023-04-24 LAB — LIPASE, BLOOD: Lipase: 30 U/L (ref 11–51)

## 2023-04-24 MED ORDER — MORPHINE SULFATE (PF) 4 MG/ML IV SOLN
4.0000 mg | Freq: Once | INTRAVENOUS | Status: AC
  Administered 2023-04-25: 4 mg via INTRAVENOUS
  Filled 2023-04-24: qty 1

## 2023-04-24 MED ORDER — SODIUM CHLORIDE 0.9 % IV SOLN: Freq: Once | INTRAVENOUS | Status: AC

## 2023-04-24 NOTE — ED Triage Notes (Signed)
Pt presents Dysuria x 1 week. Pt reports he is coughing up phlegm at night.  Pt reports vomiting and diarrhea.

## 2023-04-24 NOTE — ED Provider Notes (Signed)
[facility]    CSN: [id number] Arrival date & time: 04/24/23  1505      History   Chief Complaint Chief Complaint  Patient presents with   Diarrhea   Emesis   Urinary Tract Infection   Cough    HPI Bryan [name] is a 77 y.o. male.  Cough qhs x 1 week, had a HA but resolved. Cough is not bothersome in the am. Has not had a fever, wheezing or SOB 2- Dysuria x 3 days  and is on antibiotics right now by his urologist for UTI. The bad odor has resolved, but still has dysuria.  His urine has been brown color.  3- Diarrhea since Sunday 3-5 times a day with terrible abdominal cramps. He tried to see someone Monday, but they sent him back to his PCP. His PCP was going to work him in at 1 pm, but pt did not stay since he had his dogs in the car and would have had to wait 4 hours. He denies vomiting.      Past Medical History:  Diagnosis Date   Anemia    Arthritis    hands, knees, cervical area. Back pain. Rheumatoid arthritis- weekly injections.   BPH (benign prostatic hypertrophy)    Cancer (HCC)    Diabetes (HCC)    Essential tremor    GAD (generalized anxiety disorder) 12/05/2021   GERD (gastroesophageal reflux disease)    reports for indigestion he uses mustard    Hearing deficit    wears hearing aids bilateral   HTN (hypertension)    Obesity    OSA (obstructive sleep apnea)    PAF (paroxysmal atrial fibrillation) (HCC)    Refusal of blood transfusions as patient is Jehovah's Witness    Rheumatoid arthritis (HCC)    Right bundle branch block    history of   Seizures (HCC)    AS A CHILD. only esential tremors now.   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) 06/27/2022   Overweight 06/27/2022   Exposure to Agent Orange 06/27/2022   Hand pain 06/27/2022   Pain in right knee 06/27/2022   Pseudoarthrosis of lumbar spine 06/27/2022   Sensorineural hearing loss, bilateral 06/27/2022   Tremor 06/27/2022   Unilateral primary osteoarthritis, right knee 06/27/2022   Benign essential hypertension 06/27/2022   Hypertensive disorder 06/27/2022   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   Atrial fibrillation (HCC) 06/27/2022   Other persistent atrial fibrillation (HCC) 06/27/2022   Sleep apnea, unspecified 06/27/2022   Obstructive sleep apnea syndrome 06/27/2022   Obstructive sleep apnea (adult) (pediatric) 06/27/2022   Cervical radiculopathy 06/27/2022   Cervical spinal stenosis 06/27/2022   Radiculopathy, lumbar region 06/27/2022   Encounter for other administrative examinations 06/27/2022   Preoperative cardiovascular examination 06/27/2022   Degeneration of lumbar or lumbosacral intervertebral disc 06/27/2022   Pain in joint involving shoulder region 06/27/2022   Diarrhea, unspecified 06/27/2022   Encounter for  fitting and adjustment of hearing aid 06/27/2022   Dyspnea on exertion 02/20/2022   Acute on chronic diastolic CHF (congestive heart failure) (HCC) 01/20/2022   History of pulmonary embolism 01/20/2022   Atrial fibrillation with RVR (HCC) 01/19/2022   OSA on CPAP 12/05/2021   GAD (generalized anxiety disorder) 12/05/2021   History of Hodgkin's lymphoma 12/05/2021   Hypoglycemia 12/05/2021   MALT (mucosa associated lymphoid tissue) (HCC) 12/05/2021   Mixed hyperlipidemia 12/05/2021   Multiple pulmonary emboli (HCC) 12/05/2021   Normocytic anemia 12/05/2021   OA (osteoarthritis)  12/05/2021   Primary hypertension 12/05/2021   Chronic pain syndrome 12/05/2021   Paroxysmal atrial flutter (HCC) 12/05/2021   Severe sepsis (HCC) 10/09/2021   New onset atrial fibrillation (HCC) 10/09/2021   Acute blood loss anemia 10/09/2021   Spinal stenosis of lumbar region 10/02/2021   Abnormal stress test 04/27/2020   Coronary artery disease involving native coronary artery of native heart without angina pectoris 04/27/2020   Irregular heart beat 04/27/2020   Spondylolisthesis at L4-L5 level 12/25/2019   Lumbar post-laminectomy syndrome 08/13/2019   Lumbar spondylosis 08/13/2019   DDD (degenerative disc disease), lumbar 01/16/2019   Lumbar radiculopathy 09/25/2018   Precordial pain 07/30/2018   Elevated troponin    Pain 02/25/2018   Marginal zone lymphoma (HCC) 02/02/2018   Lymphoma (HCC) 01/24/2018   Parotid mass 10/28/2017   Arthralgia of right lower leg 10/10/2017   Chronic low back pain 04/16/2017   Class 2 severe obesity due to excess calories with serious comorbidity and body mass index (BMI) of 39.0 to 39.9 in adult (HCC) 03/07/2017   ACE-inhibitor cough 03/07/2017   Lumbago 06/21/2016   Spinal stenosis, lumbar region, with neurogenic claudication 02/15/2015   Obesity (BMI 30-39.9) 12/07/2013   Refusal of blood transfusions as patient is Jehovah's Witness 12/07/2013   Cervical spondylosis without myelopathy 04/27/2013   Rheumatoid arthritis (HCC) 10/26/2012   Nonspecific abnormal finding in stool contents 07/28/2012   Multiple joint pain 06/05/2012   History of tobacco use-  06/05/2012   Asthmatic bronchitis 12/15/2011   BPH (benign prostatic hyperplasia) 12/15/2011   Essential tremor 12/15/2011   Essential hypertension 12/15/2011    Past Surgical History:  Procedure Laterality Date   ABDOMINAL EXPOSURE N/A 07/30/2022   Procedure: ABDOMINAL EXPOSURE;  Surgeon: Cephus Shelling, MD;  Location: [facility] OR;  Service: Vascular;  Laterality: N/A;   ANTERIOR CERVICAL  DECOMP/DISCECTOMY FUSION N/A 03/30/2013   Procedure: ANTERIOR CERVICAL DECOMPRESSION/DISCECTOMY FUSION 2 LEVELS;  Surgeon: Clydene Fake, MD;  Location: [facility];  Service: Neurosurgery;  Laterality: N/A;  C4-5 C5-6 Anterior cervical decompression/diskectomy/fusion/Allograft/Plate   ANTERIOR LUMBAR FUSION N/A 07/30/2022   Procedure: Anterior Lumbar  Interbody Fusion  - Lumbar five-sacral one posterior augmentation with iliac screws and O- arm;  Surgeon: Donalee Citrin, MD;  Location: [facility] OR;  Service: Neurosurgery;  Laterality: N/A;   CHOLECYSTECTOMY N/A 12/07/2013   Procedure: LAPAROSCOPIC CHOLECYSTECTOMY WITH INTRAOPERATIVE CHOLANGIOGRAM;  Surgeon: Ardeth Sportsman, MD;  Location: [facility];  Service: General;  Laterality: N/A;   COLONOSCOPY     DECOMPRESSIVE LUMBAR LAMINECTOMY LEVEL 2 N/A 02/15/2015   Procedure: COMPLETE DECOMPRESSIVE LUMBAR LAMINECTOMY L4-L5/ FORAMINOTOMY TO L4 NERVE ROOT AND L5 NERVE ROOT BILATERALLY;  Surgeon: Ranee Gosselin, MD;  Location: [facility];  Service: Orthopedics;  Laterality: N/A;   EYE SURGERY     right, growth excision   LAMINECTOMY WITH POSTERIOR LATERAL ARTHRODESIS LEVEL 1 N/A 07/30/2022   Procedure: LAMINECTOMY WITH POSTERIOR LATERAL ARTHRODESIS LUMBAR FIVE SACRAL ONE;  Surgeon: Donalee Citrin, MD;  Location: [facility] OR;  Service: Neurosurgery;  Laterality: N/A;   LUMBAR LAMINECTOMY/DECOMPRESSION MICRODISCECTOMY Left 01/18/2016   Procedure:  DECOMPRESSION L4-L5 MICRODISCECTOMY L4-L5 ON LEFT FOR SPINAL STENOSIS;  Surgeon: Ranee Gosselin, MD;  Location: [facility];  Service: Orthopedics;  Laterality: Left;   PAROTIDECTOMY Left 01/24/2018   Procedure: INCISIONAL BIOPSY OF LEFT PAROTID    MASS;  Surgeon: Graylin Shiver, MD;  Location: [facility] OR;  Service: ENT;  Laterality: Left;   SPINE SURGERY     TONSILLECTOMY     VASECTOMY     WRIST GANGLION EXCISION Left        Home Medications    Prior to Admission medications   Medication Sig Start Date End Date Taking? Authorizing Provider   acetaminophen (TYLENOL) 325 MG tablet Take 1-2 tablets (325-650 mg total) by mouth every 4 (four) hours as needed for mild pain. 08/09/22  Yes Love, Evlyn Kanner, PA-C  cyclobenzaprine (FLEXERIL) 5 MG tablet Take 1 tablet (5 mg total) by mouth every 8 (eight) hours as needed for muscle spasms. 08/09/22  Yes Love, Evlyn Kanner, PA-C  dabigatran (PRADAXA) 150 MG CAPS capsule Take 150 mg by mouth 2 (two) times daily.   Yes [provider]  diltiazem (CARDIZEM CD) 120 MG 24 hr capsule Take 1 capsule (120 mg total) by mouth daily. 05/11/22  Yes Cantwell, Celeste C, PA-C  furosemide (LASIX) 20 MG tablet Take 1 tablet (20 mg total) by mouth daily. 05/11/22  Yes Cantwell, Celeste C, PA-C  HYDROcodone-acetaminophen (NORCO/VICODIN) 5-325 MG tablet Take 1 tablet by mouth every 6 (six) hours as needed for moderate pain or severe pain. 11/15/22  Yes Hagler, Arlys Sasha, MD  hydroxychloroquine (PLAQUENIL) 200 MG tablet 1 tablet with food or milk Orally twice a day for 30 days   Yes [provider]  LACTOBACILLUS PO Take 1 capsule by mouth 2 (two) times daily.   Yes [provider]  metoprolol succinate (TOPROL-XL) 100 MG 24 hr tablet TAKE 1 TABLET BY MOUTH ONCE DAILY WITH  OR  IMMEDIATELY  FOLLOWING  A  MEAL 05/10/20  Yes Patwardhan, Manish J, MD  metoprolol succinate (TOPROL-XL) 50 MG 24 hr tablet Oral for 90   Yes [provider]  naproxen (NAPROSYN) 500 MG tablet 1 tablet with food or milk as needed Orally every 12 hrs   Yes [provider]  pantoprazole (PROTONIX) 40 MG tablet Take 1 tablet (40 mg total) by mouth at bedtime. 08/09/22  Yes Love, Evlyn Kanner, PA-C  primidone (MYSOLINE) 50 MG tablet Take 150 mg by mouth 2 (two) times daily.   Yes [provider]  Tamsulosin HCl (FLOMAX) 0.4 MG CAPS Take 0.4 mg by mouth at bedtime.   Yes [provider]  Multiple Vitamin (MULTIVITAMIN WITH MINERALS) TABS tablet Take 1 tablet by mouth daily.    [provider]   nitroGLYCERIN (NITROSTAT) 0.4 MG SL tablet Place 0.4 mg under the tongue every 5 (five) minutes as needed for chest pain.    [provider]    Family History Family History  Problem Relation Age of Onset   Colon cancer Maternal Grandfather    Kidney disease Brother    Hypertension Brother    Diabetes Brother    Stroke Maternal Grandmother    Diabetes Mother    Hypertension Mother    CAD Father        died of MI at age 79   Hypertension Father    Diabetes Paternal  Aunt        x 4 aunts    Social History Social History   Tobacco Use   Smoking status: Former    Packs/day: 0.50    Years: 14.00    Additional pack years: 0.00    Total pack years: 7.00    Types: Cigarettes    Quit date: 12/14/1972    Years since quitting: 50.3   Smokeless tobacco: Never  Vaping Use   Vaping Use: Never used  Substance Use Topics   Alcohol use: Yes    Comment: occasionally   Drug use: No     Allergies   Terazosin, Adalimumab, Statins, Ampicillin, Lisinopril, and Other   Review of Systems Review of Systems  Constitutional:  Negative for appetite change, chills, diaphoresis and fever.  HENT:  Negative for congestion, ear discharge, ear pain and postnasal drip.   Respiratory:  Positive for cough.   Gastrointestinal:  Positive for abdominal pain and diarrhea. Negative for blood in stool, constipation, nausea and vomiting.  Genitourinary:  Positive for dysuria. Negative for flank pain, frequency and hematuria.     Physical Exam Triage Vital Signs ED Triage Vitals [04/24/23 1626]  Enc Vitals Group     BP (!) 154/79     Pulse Rate 95     Resp 18     Temp 99.1 F (37.3 C)     Temp Source Oral     SpO2 98 %     Weight      Height      Head Circumference      Peak Flow      Pain Score      Pain Loc      Pain Edu?      Excl. in GC?    Orthostatic VS for the past 24 hrs:  BP- Lying Pulse- Lying BP- Sitting Pulse- Sitting BP- Standing at 0 minutes Pulse- Standing at 0  minutes  04/24/23 1649 175/78 73 155/87 76 159/84 76    Updated Vital Signs BP (!) 154/79 (BP Location: Left Arm)   Pulse 95   Temp 99.1 F (37.3 C) (Oral)   Resp 18   SpO2 98%   Visual Acuity Right Eye Distance:   Left Eye Distance:   Bilateral Distance:    Right Eye Near:   Left Eye Near:    Bilateral Near:     Physical Exam Vitals and nursing note reviewed.  Constitutional:      Appearance: He is obese. He is ill-appearing.     Comments: Moves slow due to abdominal pain  HENT:     Right Ear: Tympanic membrane, ear canal and external ear normal.     Left Ear: Tympanic membrane, ear canal and external ear normal.     Nose: Nose normal.  Eyes:     General: No scleral icterus.    Conjunctiva/sclera: Conjunctivae normal.  Pulmonary:     Effort: Pulmonary effort is normal.     Breath sounds: Normal breath sounds.  Abdominal:     General: Bowel sounds are normal.     Palpations: Abdomen is soft. There is no mass.     Tenderness: There is abdominal tenderness. There is guarding. There is no rebound.  Musculoskeletal:        General: Normal range of motion.     Cervical back: Neck supple.  Skin:    General: Skin is warm and dry.     Findings: No rash.  Neurological:  Mental Status: He is alert and oriented to person, place, and time.     Gait: Gait normal.  Psychiatric:        Mood and Affect: Mood normal.        Behavior: Behavior normal.        Thought Content: Thought content normal.        Judgment: Judgment normal.      UC Treatments / Results  Labs (all labs ordered are listed, but only abnormal results are displayed) Labs Reviewed  POCT URINALYSIS DIP (MANUAL ENTRY) - Abnormal; Notable for the following components:      Result Value   Color, UA other (*)    Clarity, UA cloudy (*)    Ketones, POC UA trace (5) (*)    Blood, UA moderate (*)    Protein Ur, POC =100 (*)    Leukocytes, UA Trace (*)    All other components within normal limits     EKG   Radiology No results found.  Procedures Procedures (including critical care time)  Medications Ordered in UC Medications - No data to display  Initial Impression / Assessment and Plan / UC Course  I have reviewed the triage vital signs and the nursing notes.  Pertinent labs  results that were available during my care of the patient were reviewed by me and considered in my medical decision making (see chart for details).  Acute abdominal pain Diarrhea Cough Dysuria I sent him to ER for further work up due to guarding during abdominal exam.    Final Clinical Impressions(s) / UC Diagnoses   Final diagnoses:  Acute abdominal pain  Diarrhea, unspecified type     Discharge Instructions      Go to the ER right now since your are very guarded during your abdominal exam to have more test than what we can do here at the urgent care. You are also on antibiotics for your bladder infection which the urine looks better, but you still have moderate blood in your urine, and it can cause infectious diarrhea called C-difficile     ED Prescriptions   None    PDMP not reviewed this encounter.   Garey Ham, PA-C 04/24/23 1719

## 2023-04-24 NOTE — ED Triage Notes (Signed)
Pt arrives via POV. Pt reports abdominal pain, dysuria, nausea, and diarrhea since Monday. Pt AxOx4.

## 2023-04-24 NOTE — Discharge Instructions (Signed)
Go to the ER right now since your are very guarded during your abdominal exam to have more test than what we can do here at the urgent care. You are also on antibiotics for your bladder infection which the urine looks better, but you still have moderate blood in your urine, and it can cause infectious diarrhea called C-difficile

## 2023-04-24 NOTE — ED Provider Notes (Signed)
[place] EMERGENCY DEPARTMENT AT [facility] Provider Note  CSN: [id number] Arrival date & time: 04/24/23 1737  Chief Complaint(s) Abdominal Pain, Diarrhea, and Dysuria  HPI Bryan [name] is a 77 y.o. male     Abdominal Pain Pain location:  Suprapubic and RLQ Pain quality: aching   Pain severity:  Moderate Onset quality:  Gradual Duration:  4 days Timing:  Constant Progression:  Waxing and waning Chronicity:  New Relieved by:  Nothing Worsened by:  Movement and palpation Associated symptoms: diarrhea, dysuria and hematuria   Associated symptoms: no fever, no nausea and no vomiting   Diarrhea Associated symptoms: abdominal pain   Associated symptoms: no fever and no vomiting   Dysuria Presenting symptoms: dysuria   Associated symptoms: abdominal pain, diarrhea and hematuria   Associated symptoms: no fever, no nausea and no vomiting    Patient placed on Macrobid on 6/10 by [place]. Urine color has improved, but dysuria and lower and pain has not.   Past Medical History Past Medical History:  Diagnosis Date   Anemia    Arthritis    hands, knees, cervical area. Back pain. Rheumatoid arthritis- weekly injections.   BPH (benign prostatic hypertrophy)    Cancer (HCC)    Diabetes (HCC)    Essential tremor    GAD (generalized anxiety disorder) 12/05/2021   GERD (gastroesophageal reflux disease)    reports for indigestion he uses mustard    Hearing deficit    wears hearing aids bilateral   HTN (hypertension)    Obesity    OSA (obstructive sleep apnea)    PAF (paroxysmal atrial fibrillation) (HCC)    Refusal of blood transfusions as patient is Jehovah's Witness    Rheumatoid arthritis (HCC)    Right bundle branch block    history of   Seizures (HCC)    AS A CHILD. only esential tremors now.   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) 06/27/2022   Overweight 06/27/2022   Exposure to Agent Orange 06/27/2022   Hand pain 06/27/2022   Pain in right knee 06/27/2022   Pseudoarthrosis of lumbar spine 06/27/2022   Sensorineural hearing loss, bilateral 06/27/2022   Tremor 06/27/2022   Unilateral primary osteoarthritis, right knee 06/27/2022   Benign essential hypertension 06/27/2022   Hypertensive disorder 06/27/2022   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   Atrial fibrillation (HCC) 06/27/2022   Other persistent atrial fibrillation (HCC) 06/27/2022   Sleep apnea, unspecified 06/27/2022   Obstructive sleep apnea syndrome 06/27/2022   Obstructive sleep apnea (adult) (pediatric) 06/27/2022   Cervical radiculopathy 06/27/2022   Cervical spinal stenosis 06/27/2022   Radiculopathy, lumbar region 06/27/2022   Encounter for other administrative examinations 06/27/2022   Preoperative cardiovascular examination 06/27/2022   Degeneration of lumbar or lumbosacral intervertebral disc 06/27/2022   Pain in joint involving shoulder region 06/27/2022   Diarrhea, unspecified 06/27/2022   Encounter for fitting and adjustment of hearing aid 06/27/2022   Dyspnea  on exertion 02/20/2022   Acute on chronic diastolic CHF (congestive heart failure) (HCC) 01/20/2022   History of pulmonary embolism 01/20/2022   Atrial fibrillation with RVR (HCC) 01/19/2022   OSA on CPAP 12/05/2021   GAD (generalized anxiety disorder) 12/05/2021   History of Hodgkin's lymphoma 12/05/2021   Hypoglycemia 12/05/2021   MALT (mucosa associated  lymphoid tissue) (HCC) 12/05/2021   Mixed hyperlipidemia 12/05/2021   Multiple pulmonary emboli (HCC) 12/05/2021   Normocytic anemia 12/05/2021   OA (osteoarthritis) 12/05/2021   Primary hypertension 12/05/2021   Chronic pain syndrome 12/05/2021   Paroxysmal atrial flutter (HCC) 12/05/2021   Severe sepsis (HCC) 10/09/2021   New onset atrial fibrillation (HCC) 10/09/2021   Acute blood loss anemia 10/09/2021   Spinal stenosis of lumbar region 10/02/2021   Abnormal stress test 04/27/2020   Coronary artery disease involving native coronary artery of native heart without angina pectoris 04/27/2020   Irregular heart beat 04/27/2020   Spondylolisthesis at L4-L5 level 12/25/2019   Lumbar post-laminectomy syndrome 08/13/2019   Lumbar spondylosis 08/13/2019   DDD (degenerative disc disease), lumbar 01/16/2019   Lumbar radiculopathy 09/25/2018   Precordial pain 07/30/2018   Elevated troponin    Pain 02/25/2018   Marginal zone lymphoma (HCC) 02/02/2018   Lymphoma (HCC) 01/24/2018   Parotid mass 10/28/2017   Arthralgia of right lower leg 10/10/2017   Chronic low back pain 04/16/2017   Class 2 severe obesity due to excess calories with serious comorbidity and body mass index (BMI) of 39.0 to 39.9 in adult (HCC) 03/07/2017   ACE-inhibitor cough 03/07/2017   Lumbago 06/21/2016   Spinal stenosis, lumbar region, with neurogenic claudication 02/15/2015   Obesity (BMI 30-39.9) 12/07/2013   Refusal of blood transfusions as patient is Jehovah's Witness 12/07/2013   Cervical spondylosis without myelopathy 04/27/2013   Rheumatoid arthritis (HCC) 10/26/2012   Nonspecific abnormal finding in stool contents 07/28/2012   Multiple joint pain 06/05/2012   History of tobacco use-  06/05/2012   Asthmatic bronchitis 12/15/2011   BPH (benign prostatic hyperplasia) 12/15/2011   Essential tremor 12/15/2011   Essential hypertension 12/15/2011   Home Medication(s) Prior to Admission medications   Medication  Sig Start Date End Date Taking? Authorizing Provider  amoxicillin-clavulanate (AUGMENTIN) 875-125 MG tablet Take 1 tablet by mouth every 12 (twelve) hours. 04/25/23  Yes Bellami Farrelly, Amadeo Garnet, MD  ondansetron (ZOFRAN-ODT) 4 MG disintegrating tablet Take 1 tablet (4 mg total) by mouth every 8 (eight) hours as needed for up to 3 days for nausea or vomiting. 04/25/23 04/28/23 Yes Caycee Wanat, Amadeo Garnet, MD  oxyCODONE (ROXICODONE) 5 MG immediate release tablet Take 0.5-1 tablets (2.5-5 mg total) by mouth every 6 (six) hours as needed for up to 3 days for severe pain. 04/25/23 04/28/23 Yes Daxter Paule, Amadeo Garnet, MD  acetaminophen (TYLENOL) 325 MG tablet Take 1-2 tablets (325-650 mg total) by mouth every 4 (four) hours as needed for mild pain. 08/09/22   Love, Evlyn Kanner, PA-C  cyclobenzaprine (FLEXERIL) 5 MG tablet Take 1 tablet (5 mg total) by mouth every 8 (eight) hours as needed for muscle spasms. 08/09/22   Love, Evlyn Kanner, PA-C  dabigatran (PRADAXA) 150 MG CAPS capsule Take 150 mg by mouth 2 (two) times daily.    [provider]  diltiazem (CARDIZEM CD) 120 MG 24 hr capsule Take 1 capsule (120 mg total) by mouth daily. 05/11/22   Cantwell, Celeste C, PA-C  furosemide (LASIX) 20 MG tablet Take 1 tablet (20 mg total) by mouth daily. 05/11/22  Cantwell, Celeste C, PA-C  hydroxychloroquine (PLAQUENIL) 200 MG tablet 1 tablet with food or milk Orally twice a day for 30 days    [provider]  LACTOBACILLUS PO Take 1 capsule by mouth 2 (two) times daily.    [provider]  metoprolol succinate (TOPROL-XL) 100 MG 24 hr tablet TAKE 1 TABLET BY MOUTH ONCE DAILY WITH  OR  IMMEDIATELY  FOLLOWING  A  MEAL 05/10/20   Patwardhan, Manish J, MD  metoprolol succinate (TOPROL-XL) 50 MG 24 hr tablet Oral for 90    [provider]  Multiple Vitamin (MULTIVITAMIN WITH MINERALS) TABS tablet Take 1 tablet by mouth daily.    [provider]  naproxen (NAPROSYN) 500 MG tablet 1 tablet with  food or milk as needed Orally every 12 hrs    [provider]  nitroGLYCERIN (NITROSTAT) 0.4 MG SL tablet Place 0.4 mg under the tongue every 5 (five) minutes as needed for chest pain.    [provider]  pantoprazole (PROTONIX) 40 MG tablet Take 1 tablet (40 mg total) by mouth at bedtime. 08/09/22   Love, Evlyn Kanner, PA-C  primidone (MYSOLINE) 50 MG tablet Take 150 mg by mouth 2 (two) times daily.    [provider]  Tamsulosin HCl (FLOMAX) 0.4 MG CAPS Take 0.4 mg by mouth at bedtime.    [provider]                                                                                                                                    Allergies Terazosin, Adalimumab, Statins, Ampicillin, Lisinopril, and Other  Review of Systems Review of Systems  Constitutional:  Negative for fever.  Gastrointestinal:  Positive for abdominal pain and diarrhea. Negative for nausea and vomiting.  Genitourinary:  Positive for dysuria and hematuria.   As noted in HPI  Physical Exam Vital Signs  I have reviewed the triage vital signs BP (!) 158/80   Pulse 90   Temp 98.8 F (37.1 C) (Oral)   Resp 18   Ht 5' 5.5" (1.664 m)   Wt 97.5 kg   SpO2 94%   BMI 35.23 kg/m   Physical Exam Vitals reviewed.  Constitutional:      General: He is not in acute distress.    Appearance: He is well-developed. He is not diaphoretic.  HENT:     Head: Normocephalic and atraumatic.     Right Ear: External ear normal.     Left Ear: External ear normal.     Nose: Nose normal.     Mouth/Throat:     Mouth: Mucous membranes are moist.  Eyes:     General: No scleral icterus.    Conjunctiva/sclera: Conjunctivae normal.  Neck:     Trachea: Phonation normal.  Cardiovascular:     Rate and Rhythm: Normal rate and regular rhythm.  Pulmonary:     Effort: Pulmonary effort is normal. No  respiratory distress.     Breath sounds: No stridor.  Abdominal:     General: There is no distension.      Tenderness: There is abdominal tenderness in the right lower quadrant and suprapubic area. There is no guarding or rebound.  Musculoskeletal:        General: Normal range of motion.     Cervical back: Normal range of motion.  Neurological:     Mental Status: He is alert and oriented to person, place, and time.  Psychiatric:        Behavior: Behavior normal.     ED Results and Treatments Labs (all labs ordered are listed, but only abnormal results are displayed) Labs Reviewed  COMPREHENSIVE METABOLIC PANEL - Abnormal; Notable for the following components:      Result Value   Creatinine, Ser 1.66 (*)    Calcium 8.7 (*)    Albumin 3.1 (*)    GFR, Estimated 42 (*)    All other components within normal limits  CBC - Abnormal; Notable for the following components:   WBC 13.1 (*)    RBC 3.93 (*)    Hemoglobin 12.0 (*)    HCT 38.4 (*)    All other components within normal limits  URINALYSIS, ROUTINE W REFLEX MICROSCOPIC - Abnormal; Notable for the following components:   APPearance HAZY (*)    Hgb urine dipstick MODERATE (*)    Protein, ur 100 (*)    Leukocytes,Ua MODERATE (*)    Bacteria, UA FEW (*)    All other components within normal limits  LIPASE, BLOOD                                                                                                                         EKG  EKG Interpretation  Date/Time:    Ventricular Rate:    PR Interval:    QRS Duration:   QT Interval:    QTC Calculation:   R Axis:     Text Interpretation:         Radiology CT ABDOMEN PELVIS W CONTRAST  Result Date: 04/25/2023 CLINICAL DATA:  Right lower quadrant pain. History of B-cell lymphoma. EXAM: CT ABDOMEN AND PELVIS WITH CONTRAST TECHNIQUE: Multidetector CT imaging of the abdomen and pelvis was performed using the standard protocol following bolus administration of intravenous contrast. RADIATION DOSE REDUCTION: This exam was performed according to the departmental dose-optimization  program which includes automated exposure control, adjustment of the mA and/or kV according to patient size and/or use of iterative reconstruction technique. CONTRAST:  60mL OMNIPAQUE IOHEXOL 350 MG/ML SOLN COMPARISON:  PET-CTs dated 01/01/2018 and 04/22/2018 FINDINGS: Lower chest: The cardiac size is normal. There is no pericardial effusion. There is chronic subpleural reticulation in the extreme base of both lungs and chronic right paraspinal scar-like opacity in the lower lobe. Lung bases are clear of infiltrates. Hepatobiliary: The liver is 16 cm length mildly steatotic. There is no mass enhancement. Again noted are changes of old  cholecystectomy without biliary dilatation. Pancreas: No abnormality. Spleen: No abnormality.  No splenomegaly. Adrenals/Urinary Tract: There is no adrenal or renal mass enhancement. There is a 8 mm too small to characterize hypodensity in the lower medial left kidney consistent with a Bosniak 2 renal cyst. No follow-up imaging is recommended. The remainder of both kidneys enhanced homogeneously. There is no urinary stone or obstruction. The bladder is not fully distended but could be mildly thick walled. There are faint stranding changes around the bladder but this was also seen 5 years ago and could be chronic. There is increased stranding in the pelvic sidewalls adjacent the prostate gland. Please correlate clinically for cystoprostatitis versus changes of XRT. Stomach/Bowel: The stomach is contracted. There is a 3 cm thin walled diverticulum with an air-fluid level of the mid second portion of the duodenum. The small bowel is normal caliber. There are diffuse colonic diverticula, additional diverticulosis of the terminal ileum. There is an inflammatory process medial to the proximal ascending colon centered over the terminal most ileum and some of the ascending colonic and terminal ileal diverticula. The appendix is just anterior to this process. It is slightly prominent but the  inflammatory changes are not associated with it. The appendix measures 9 mm today, previously 7.5 mm. The findings are most likely due to a terminal ileocolic diverticulitis. There is no evidence of a diverticular abscess, free air or intramural abscess. Other diffuse colonic diverticula are uncomplicated. Vascular/Lymphatic: Aortic atherosclerosis. No enlarged abdominal or pelvic lymph nodes. Reproductive: Borderline slight prominence of the prostate, 4.3 cm transverse with dystrophic calcifications centrally. Other: Trace reactive pelvic ascites. No localizing collection. No free air, free hemorrhage or incarcerated hernia. Small umbilical fat hernia. Musculoskeletal: L2-S1 fusion rods, pedicle screws and interbody metallic disc space hardware. Osteopenia and degenerative change is seen above the hardware with bridging enthesopathy. There are laminectomy defects from L2-5. The hardware was not seen 5 years ago. Up to 2 mm of lucency is seen alongside both sacral bolts and along the L3 pedicle screws. Correlate clinically for clinical findings of loosening. IMPRESSION: 1. Inflammatory process medial to the proximal ascending colon, centered over the terminal ileum and some of the ascending colonic and terminal ileal diverticula. The appendix is just anterior to this process but the inflammatory changes are not associated with the appendix. 2. The findings are most likely due to a terminal ileocolic diverticulitis. No diverticular abscess, free air or free collection. 3. Diffuse diverticulosis. 4. Increased stranding in the pelvic sidewalls adjacent to the prostate gland with bladder wall thickening versus nondistention. Please correlate clinically for cystoprostatitis versus changes of XRT. 5. Aortic atherosclerosis. 6. Mild hepatic steatosis. 7. L2-S1 fusion rods and pedicle screws with 2 mm lucency alongside both sacral bolts and along the L3 pedicle screws. 8. Small umbilical fat hernia. Aortic Atherosclerosis  (ICD10-I70.0). Electronically Signed   By: Almira Bar M.D.   On: 04/25/2023 02:11    Medications Ordered in ED Medications  0.9 %  sodium chloride infusion (0 mLs Intravenous Stopped 04/25/23 0250)  morphine (PF) 4 MG/ML injection 4 mg (4 mg Intravenous Given 04/25/23 0057)  iohexol (OMNIPAQUE) 350 MG/ML injection 60 mL (60 mLs Intravenous Contrast Given 04/25/23 0125)  amoxicillin-clavulanate (AUGMENTIN) 875-125 MG per tablet 1 tablet (1 tablet Oral Given 04/25/23 0351)   Procedures Procedures  (including critical care time) Medical Decision Making / ED Course   Medical Decision Making Amount and/or Complexity of Data Reviewed Labs: ordered. Radiology: ordered.  Risk Prescription drug management.  Patient presents with right lower quadrant abdominal pain Currently being treated for urinary tract infection Right lower quadrant tenderness to palpation On review of records, patient was placed on Macrobid by the [place] 2 days ago.  Given IVF and morphine  Differential includes but not limited to ascending urinary tract infection, appendicitis, colitis. Doubt C.Diff given duration of Abx regimen.  CBC with leukocytosis.  No anemia. CMP without significant electrolyte derangements.  Mild renal insufficiency without AKI.  No biliary obstruction or pancreatitis. UA notable for inflammatory changes.  Not convincing for infection but likely incomplete treatment.   CT scan notable for distal ileum/ascending colon diverticulitis.  Appendix is normal.  Pain improved.  Given dose of augmentin    Final Clinical Impression(s) / ED Diagnoses Final diagnoses:  Diverticulitis   The patient appears reasonably screened and/or stabilized for discharge and I doubt any other medical condition or other [facility] requiring further screening, evaluation, or treatment in the ED at this time. I have discussed the findings, Dx and Tx plan with the patient/family who expressed understanding and agree(s)  with the plan. Discharge instructions discussed at length. The patient/family was given strict return precautions who verbalized understanding of the instructions. No further questions at time of discharge.  Disposition: Discharge  Condition: Good  ED Discharge Orders          Ordered    ondansetron (ZOFRAN-ODT) 4 MG disintegrating tablet  Every 8 hours PRN        04/25/23 0418    amoxicillin-clavulanate (AUGMENTIN) 875-125 MG tablet  Every 12 hours        04/25/23 0418    oxyCODONE (ROXICODONE) 5 MG immediate release tablet  Every 6 hours PRN        04/25/23 0418             Follow Up: Raymon Mutton., FNP [address] [place] [address] [phone]  Call  to schedule an appointment for close follow up    This chart was dictated using voice recognition software.  Despite best efforts to proofread,  errors can occur which can change the documentation meaning.    Nira Conn, MD 04/25/23 [phone]

## 2023-04-25 ENCOUNTER — Emergency Department (HOSPITAL_COMMUNITY): Payer: No Typology Code available for payment source

## 2023-04-25 MED ORDER — IOHEXOL 350 MG/ML SOLN
60.0000 mL | Freq: Once | INTRAVENOUS | Status: AC | PRN
  Administered 2023-04-25: 60 mL via INTRAVENOUS

## 2023-04-25 MED ORDER — AMOXICILLIN-POT CLAVULANATE 875-125 MG PO TABS
1.0000 | ORAL_TABLET | Freq: Once | ORAL | Status: AC
  Administered 2023-04-25: 1 via ORAL
  Filled 2023-04-25: qty 1

## 2023-04-25 MED ORDER — AMOXICILLIN-POT CLAVULANATE 875-125 MG PO TABS: 1.0000 | ORAL_TABLET | Freq: Two times a day (BID) | ORAL | 0 refills | Status: DC

## 2023-04-25 MED ORDER — OXYCODONE HCL 5 MG PO TABS: 2.5000 mg | ORAL_TABLET | Freq: Four times a day (QID) | ORAL | 0 refills | Status: AC | PRN

## 2023-04-25 MED ORDER — ONDANSETRON 4 MG PO TBDP: 4.0000 mg | ORAL_TABLET | Freq: Three times a day (TID) | ORAL | 0 refills | Status: AC | PRN

## 2023-09-26 ENCOUNTER — Other Ambulatory Visit: Payer: Self-pay

## 2023-09-26 DIAGNOSIS — C8580 Other specified types of non-Hodgkin lymphoma, unspecified site: Secondary | ICD-10-CM

## 2023-09-27 ENCOUNTER — Inpatient Hospital Stay: Payer: Medicare PPO | Admitting: Hematology

## 2023-09-27 ENCOUNTER — Inpatient Hospital Stay: Payer: Medicare PPO | Attending: Hematology

## 2023-10-10 ENCOUNTER — Encounter (HOSPITAL_COMMUNITY): Payer: Self-pay

## 2023-10-10 ENCOUNTER — Other Ambulatory Visit: Payer: Self-pay

## 2023-10-10 ENCOUNTER — Inpatient Hospital Stay (HOSPITAL_COMMUNITY)
Admission: EM | Admit: 2023-10-10 | Discharge: 2023-10-13 | DRG: 392 | Disposition: A | Discharge status: Home / Self Care | Payer: No Typology Code available for payment source | Attending: Internal Medicine | Admitting: Internal Medicine

## 2023-10-10 ENCOUNTER — Emergency Department (HOSPITAL_COMMUNITY): Payer: No Typology Code available for payment source

## 2023-10-10 DIAGNOSIS — E1122 Type 2 diabetes mellitus with diabetic chronic kidney disease: Secondary | ICD-10-CM | POA: Diagnosis present

## 2023-10-10 DIAGNOSIS — E669 Obesity, unspecified: Secondary | ICD-10-CM | POA: Diagnosis present

## 2023-10-10 DIAGNOSIS — Z800 Family history of malignant neoplasm of digestive organs: Secondary | ICD-10-CM

## 2023-10-10 DIAGNOSIS — Z7902 Long term (current) use of antithrombotics/antiplatelets: Secondary | ICD-10-CM | POA: Diagnosis not present

## 2023-10-10 DIAGNOSIS — I5032 Chronic diastolic (congestive) heart failure: Secondary | ICD-10-CM | POA: Diagnosis present

## 2023-10-10 DIAGNOSIS — Z841 Family history of disorders of kidney and ureter: Secondary | ICD-10-CM | POA: Diagnosis not present

## 2023-10-10 DIAGNOSIS — K5732 Diverticulitis of large intestine without perforation or abscess without bleeding: Principal | ICD-10-CM | POA: Diagnosis present

## 2023-10-10 DIAGNOSIS — Z8249 Family history of ischemic heart disease and other diseases of the circulatory system: Secondary | ICD-10-CM | POA: Diagnosis not present

## 2023-10-10 DIAGNOSIS — Z8572 Personal history of non-Hodgkin lymphomas: Secondary | ICD-10-CM

## 2023-10-10 DIAGNOSIS — K219 Gastro-esophageal reflux disease without esophagitis: Secondary | ICD-10-CM | POA: Diagnosis present

## 2023-10-10 DIAGNOSIS — I480 Paroxysmal atrial fibrillation: Secondary | ICD-10-CM | POA: Diagnosis present

## 2023-10-10 DIAGNOSIS — Z87891 Personal history of nicotine dependence: Secondary | ICD-10-CM

## 2023-10-10 DIAGNOSIS — Z79899 Other long term (current) drug therapy: Secondary | ICD-10-CM | POA: Diagnosis not present

## 2023-10-10 DIAGNOSIS — Z833 Family history of diabetes mellitus: Secondary | ICD-10-CM

## 2023-10-10 DIAGNOSIS — I130 Hypertensive heart and chronic kidney disease with heart failure and stage 1 through stage 4 chronic kidney disease, or unspecified chronic kidney disease: Secondary | ICD-10-CM | POA: Diagnosis present

## 2023-10-10 DIAGNOSIS — Z86711 Personal history of pulmonary embolism: Secondary | ICD-10-CM | POA: Diagnosis not present

## 2023-10-10 DIAGNOSIS — N1832 Chronic kidney disease, stage 3b: Secondary | ICD-10-CM | POA: Diagnosis present

## 2023-10-10 DIAGNOSIS — Z880 Allergy status to penicillin: Secondary | ICD-10-CM | POA: Diagnosis not present

## 2023-10-10 DIAGNOSIS — D631 Anemia in chronic kidney disease: Secondary | ICD-10-CM | POA: Diagnosis present

## 2023-10-10 DIAGNOSIS — Z6836 Body mass index (BMI) 36.0-36.9, adult: Secondary | ICD-10-CM | POA: Diagnosis not present

## 2023-10-10 DIAGNOSIS — G4733 Obstructive sleep apnea (adult) (pediatric): Secondary | ICD-10-CM | POA: Diagnosis present

## 2023-10-10 DIAGNOSIS — N400 Enlarged prostate without lower urinary tract symptoms: Secondary | ICD-10-CM | POA: Diagnosis present

## 2023-10-10 DIAGNOSIS — Z9049 Acquired absence of other specified parts of digestive tract: Secondary | ICD-10-CM

## 2023-10-10 DIAGNOSIS — F411 Generalized anxiety disorder: Secondary | ICD-10-CM | POA: Diagnosis present

## 2023-10-10 DIAGNOSIS — M069 Rheumatoid arthritis, unspecified: Secondary | ICD-10-CM | POA: Diagnosis present

## 2023-10-10 DIAGNOSIS — Z974 Presence of external hearing-aid: Secondary | ICD-10-CM

## 2023-10-10 DIAGNOSIS — R5381 Other malaise: Secondary | ICD-10-CM | POA: Diagnosis present

## 2023-10-10 DIAGNOSIS — K5792 Diverticulitis of intestine, part unspecified, without perforation or abscess without bleeding: Principal | ICD-10-CM

## 2023-10-10 DIAGNOSIS — Z823 Family history of stroke: Secondary | ICD-10-CM

## 2023-10-10 DIAGNOSIS — Z8571 Personal history of Hodgkin lymphoma: Secondary | ICD-10-CM

## 2023-10-10 DIAGNOSIS — H9193 Unspecified hearing loss, bilateral: Secondary | ICD-10-CM | POA: Diagnosis present

## 2023-10-10 DIAGNOSIS — Z888 Allergy status to other drugs, medicaments and biological substances status: Secondary | ICD-10-CM | POA: Diagnosis not present

## 2023-10-10 LAB — COMPREHENSIVE METABOLIC PANEL
ALT: 15 U/L (ref 0–44)
AST: 22 U/L (ref 15–41)
Albumin: 3.3 g/dL — ABNORMAL LOW (ref 3.5–5.0)
Alkaline Phosphatase: 166 U/L — ABNORMAL HIGH (ref 38–126)
Anion gap: 6 (ref 5–15)
BUN: 22 mg/dL (ref 8–23)
CO2: 25 mmol/L (ref 22–32)
Calcium: 8.6 mg/dL — ABNORMAL LOW (ref 8.9–10.3)
Chloride: 106 mmol/L (ref 98–111)
Creatinine, Ser: 1.35 mg/dL — ABNORMAL HIGH (ref 0.61–1.24)
GFR, Estimated: 54 mL/min — ABNORMAL LOW (ref >60)
Glucose, Bld: 113 mg/dL — ABNORMAL HIGH (ref 70–99)
Potassium: 4.6 mmol/L (ref 3.5–5.1)
Sodium: 137 mmol/L (ref 135–145)
Total Bilirubin: 0.3 mg/dL (ref <1.2)
Total Protein: 6.5 g/dL (ref 6.5–8.1)

## 2023-10-10 LAB — URINALYSIS, ROUTINE W REFLEX MICROSCOPIC
Bacteria, UA: NONE SEEN
Bilirubin Urine: NEGATIVE
Glucose, UA: NEGATIVE mg/dL
Ketones, ur: NEGATIVE mg/dL
Leukocytes,Ua: NEGATIVE
Nitrite: NEGATIVE
Protein, ur: 30 mg/dL — AB
Specific Gravity, Urine: 1.017 (ref 1.005–1.030)
pH: 5 (ref 5.0–8.0)

## 2023-10-10 LAB — CBC
HCT: 37.5 % — ABNORMAL LOW (ref 39.0–52.0)
Hemoglobin: 11.8 g/dL — ABNORMAL LOW (ref 13.0–17.0)
MCH: 31 pg (ref 26.0–34.0)
MCHC: 31.5 g/dL (ref 30.0–36.0)
MCV: 98.4 fL (ref 80.0–100.0)
Platelets: 206 10*3/uL (ref 150–400)
RBC: 3.81 MIL/uL — ABNORMAL LOW (ref 4.22–5.81)
RDW: 13.9 % (ref 11.5–15.5)
WBC: 7.4 10*3/uL (ref 4.0–10.5)
nRBC: 0 % (ref 0.0–0.2)

## 2023-10-10 LAB — LIPASE, BLOOD: Lipase: 28 U/L (ref 11–51)

## 2023-10-10 MED ORDER — SODIUM CHLORIDE 0.9 % IV SOLN: INTRAVENOUS | Status: DC

## 2023-10-10 MED ORDER — TAMSULOSIN HCL 0.4 MG PO CAPS
0.8000 mg | ORAL_CAPSULE | Freq: Every day | ORAL | Status: DC
  Administered 2023-10-10 – 2023-10-12 (×3): 0.8 mg via ORAL
  Filled 2023-10-10 (×3): qty 2

## 2023-10-10 MED ORDER — METRONIDAZOLE 500 MG/100ML IV SOLN
500.0000 mg | Freq: Once | INTRAVENOUS | Status: AC
Start: 2023-10-10 — End: 2023-10-10
  Administered 2023-10-10: 500 mg via INTRAVENOUS
  Filled 2023-10-10: qty 100

## 2023-10-10 MED ORDER — METRONIDAZOLE 500 MG PO TABS: 500.0000 mg | ORAL_TABLET | Freq: Two times a day (BID) | ORAL | 0 refills | Status: DC

## 2023-10-10 MED ORDER — IOHEXOL 350 MG/ML SOLN
75.0000 mL | Freq: Once | INTRAVENOUS | Status: AC | PRN
  Administered 2023-10-10: 75 mL via INTRAVENOUS

## 2023-10-10 MED ORDER — HYDROMORPHONE HCL 1 MG/ML IJ SOLN: 0.5000 mg | Freq: Once | INTRAMUSCULAR | Status: DC

## 2023-10-10 MED ORDER — PANTOPRAZOLE SODIUM 40 MG IV SOLR
40.0000 mg | Freq: Once | INTRAVENOUS | Status: AC
  Administered 2023-10-10: 40 mg via INTRAVENOUS
  Filled 2023-10-10: qty 10

## 2023-10-10 MED ORDER — HYDROMORPHONE HCL 1 MG/ML IJ SOLN
1.0000 mg | Freq: Once | INTRAMUSCULAR | Status: AC
  Administered 2023-10-10: 1 mg via INTRAVENOUS
  Filled 2023-10-10: qty 1

## 2023-10-10 MED ORDER — SODIUM CHLORIDE 0.9 % IV SOLN
2.0000 g | INTRAVENOUS | Status: DC
  Administered 2023-10-11 – 2023-10-12 (×2): 2 g via INTRAVENOUS
  Filled 2023-10-10 (×3): qty 20

## 2023-10-10 MED ORDER — EDOXABAN TOSYLATE 30 MG PO TABS
30.0000 mg | ORAL_TABLET | ORAL | Status: DC
  Administered 2023-10-10 – 2023-10-12 (×3): 30 mg via ORAL
  Filled 2023-10-10 (×3): qty 1

## 2023-10-10 MED ORDER — PROCHLORPERAZINE EDISYLATE 10 MG/2ML IJ SOLN: 5.0000 mg | Freq: Four times a day (QID) | INTRAMUSCULAR | Status: DC | PRN

## 2023-10-10 MED ORDER — PRIMIDONE 50 MG PO TABS
100.0000 mg | ORAL_TABLET | Freq: Two times a day (BID) | ORAL | Status: DC
  Administered 2023-10-10 – 2023-10-13 (×6): 100 mg via ORAL
  Filled 2023-10-10 (×6): qty 2

## 2023-10-10 MED ORDER — ONDANSETRON HCL 4 MG/2ML IJ SOLN
4.0000 mg | Freq: Once | INTRAMUSCULAR | Status: AC
  Administered 2023-10-10: 4 mg via INTRAVENOUS
  Filled 2023-10-10: qty 2

## 2023-10-10 MED ORDER — HYDROMORPHONE HCL 1 MG/ML IJ SOLN
0.5000 mg | INTRAMUSCULAR | Status: DC | PRN
  Administered 2023-10-11: 0.5 mg via INTRAVENOUS
  Filled 2023-10-10: qty 0.5

## 2023-10-10 MED ORDER — MELATONIN 5 MG PO TABS: 5.0000 mg | ORAL_TABLET | Freq: Every evening | ORAL | Status: DC | PRN

## 2023-10-10 MED ORDER — OXYCODONE HCL 5 MG PO TABS: 5.0000 mg | ORAL_TABLET | Freq: Four times a day (QID) | ORAL | Status: DC | PRN

## 2023-10-10 MED ORDER — ADULT MULTIVITAMIN W/MINERALS CH
1.0000 | ORAL_TABLET | Freq: Every day | ORAL | Status: DC
  Administered 2023-10-10 – 2023-10-13 (×4): 1 via ORAL
  Filled 2023-10-10 (×4): qty 1

## 2023-10-10 MED ORDER — MORPHINE SULFATE (PF) 4 MG/ML IV SOLN
4.0000 mg | Freq: Once | INTRAVENOUS | Status: AC
  Administered 2023-10-10: 4 mg via INTRAVENOUS
  Filled 2023-10-10: qty 1

## 2023-10-10 MED ORDER — METRONIDAZOLE 500 MG/100ML IV SOLN
500.0000 mg | Freq: Two times a day (BID) | INTRAVENOUS | Status: DC
  Administered 2023-10-10 – 2023-10-13 (×6): 500 mg via INTRAVENOUS
  Filled 2023-10-10 (×6): qty 100

## 2023-10-10 MED ORDER — CIPROFLOXACIN HCL 500 MG PO TABS: 500.0000 mg | ORAL_TABLET | Freq: Two times a day (BID) | ORAL | 0 refills | Status: DC

## 2023-10-10 MED ORDER — ACETAMINOPHEN 325 MG PO TABS: 650.0000 mg | ORAL_TABLET | Freq: Four times a day (QID) | ORAL | Status: DC | PRN

## 2023-10-10 MED ORDER — SODIUM CHLORIDE 0.9 % IV SOLN
2.0000 g | Freq: Once | INTRAVENOUS | Status: AC
  Administered 2023-10-10: 2 g via INTRAVENOUS
  Filled 2023-10-10: qty 20

## 2023-10-10 MED ORDER — HYDROCODONE-ACETAMINOPHEN 5-325 MG PO TABS: 1.0000 | ORAL_TABLET | ORAL | 0 refills | Status: DC | PRN

## 2023-10-10 MED ORDER — POLYETHYLENE GLYCOL 3350 17 G PO PACK: 17.0000 g | PACK | Freq: Every day | ORAL | Status: DC | PRN

## 2023-10-10 MED ORDER — AMLODIPINE BESYLATE 5 MG PO TABS
5.0000 mg | ORAL_TABLET | Freq: Every day | ORAL | Status: DC
  Administered 2023-10-10 – 2023-10-13 (×4): 5 mg via ORAL
  Filled 2023-10-10 (×4): qty 1

## 2023-10-10 NOTE — ED Provider Notes (Addendum)
[place] EMERGENCY DEPARTMENT AT [facility] Provider Note   CSN: [id number] Arrival date & time: 10/10/23  1035     History  Chief Complaint  Patient presents with   Abdominal Pain    Bryan [name] is a 77 y.o. male with a past medical history significant for hypertension, RA, diabetes, paroxysmal A-fib, seizures, history of marginal zone lymphoma who presents to the ED due to mid/lower abdominal pain that started yesterday.  History of diverticulitis.  Unsure if this feels similar.  Denies any urinary symptoms.  Pain does not radiate to testicles.  Denies nausea, vomiting, diarrhea.  History of cholecystectomy however, no other abdominal operations.  No fever or chills.  History obtained from patient and past medical records. No interpreter used during encounter.       Home Medications Prior to Admission medications   Medication Sig Start Date End Date Taking? Authorizing Provider  ciprofloxacin (CIPRO) 500 MG tablet Take 1 tablet (500 mg total) by mouth every 12 (twelve) hours for 5 days. 10/10/23 10/15/23 Yes Kinlee Garrison, Merla Riches, PA-C  HYDROcodone-acetaminophen (NORCO/VICODIN) 5-325 MG tablet Take 1 tablet by mouth every 4 (four) hours as needed. 10/10/23  Yes Morayma Godown, Merla Riches, PA-C  metroNIDAZOLE (FLAGYL) 500 MG tablet Take 1 tablet (500 mg total) by mouth 2 (two) times daily for 5 days. 10/10/23 10/15/23 Yes Kayvan Hoefling, Merla Riches, PA-C  acetaminophen (TYLENOL) 325 MG tablet Take 1-2 tablets (325-650 mg total) by mouth every 4 (four) hours as needed for mild pain. 08/09/22   Love, Evlyn Kanner, PA-C  amoxicillin-clavulanate (AUGMENTIN) 875-125 MG tablet Take 1 tablet by mouth every 12 (twelve) hours. 04/25/23   Cardama, Amadeo Garnet, MD  cyclobenzaprine (FLEXERIL) 5 MG tablet Take 1 tablet (5 mg total) by mouth every 8 (eight) hours as needed for muscle spasms. 08/09/22   Love, Evlyn Kanner, PA-C  dabigatran (PRADAXA) 150 MG CAPS capsule Take 150 mg by mouth 2 (two) times daily.     [provider]  diltiazem (CARDIZEM CD) 120 MG 24 hr capsule Take 1 capsule (120 mg total) by mouth daily. 05/11/22   Cantwell, Celeste C, PA-C  furosemide (LASIX) 20 MG tablet Take 1 tablet (20 mg total) by mouth daily. 05/11/22   Cantwell, Celeste C, PA-C  hydroxychloroquine (PLAQUENIL) 200 MG tablet 1 tablet with food or milk Orally twice a day for 30 days    [provider]  LACTOBACILLUS PO Take 1 capsule by mouth 2 (two) times daily.    [provider]  metoprolol succinate (TOPROL-XL) 100 MG 24 hr tablet TAKE 1 TABLET BY MOUTH ONCE DAILY WITH  OR  IMMEDIATELY  FOLLOWING  A  MEAL 05/10/20   Patwardhan, Manish J, MD  metoprolol succinate (TOPROL-XL) 50 MG 24 hr tablet Oral for 90    [provider]  Multiple Vitamin (MULTIVITAMIN WITH MINERALS) TABS tablet Take 1 tablet by mouth daily.    [provider]  naproxen (NAPROSYN) 500 MG tablet 1 tablet with food or milk as needed Orally every 12 hrs    [provider]  nitroGLYCERIN (NITROSTAT) 0.4 MG SL tablet Place 0.4 mg under the tongue every 5 (five) minutes as needed for chest pain.    [provider]  pantoprazole (PROTONIX) 40 MG tablet Take 1 tablet (40 mg total) by mouth at bedtime. 08/09/22   Love, Evlyn Kanner, PA-C  primidone (MYSOLINE) 50 MG tablet Take 150 mg by mouth 2 (two) times daily.    [provider]  Tamsulosin HCl (FLOMAX) 0.4 MG CAPS Take 0.4 mg by mouth at bedtime.    [provider]      Allergies    Terazosin, Adalimumab, Statins, Ampicillin, Lisinopril, and Other    Review of Systems   Review of Systems  Gastrointestinal:  Positive for abdominal pain.    Physical Exam Updated Vital Signs BP (!) 170/72   Pulse (!) 57   Temp 98.1 F (36.7 C) (Oral)   Resp 12   Ht 5\' 5"  (1.651 m)   Wt 99.8 kg   SpO2 100%   BMI 36.61 kg/m  Physical Exam Vitals and nursing note reviewed.  Constitutional:      General: He is not in acute  distress.    Appearance: He is not ill-appearing.  HENT:     Head: Normocephalic.  Eyes:     Pupils: Pupils are equal, round, and reactive to light.  Cardiovascular:     Rate and Rhythm: Normal rate and regular rhythm.     Pulses: Normal pulses.     Heart sounds: Normal heart sounds. No murmur heard.    No friction rub. No gallop.  Pulmonary:     Effort: Pulmonary effort is normal.     Breath sounds: Normal breath sounds.  Abdominal:     General: Abdomen is flat. There is no distension.     Palpations: Abdomen is soft.     Tenderness: There is abdominal tenderness. There is guarding. There is no rebound.  Musculoskeletal:        General: Normal range of motion.     Cervical back: Neck supple.  Skin:    General: Skin is warm and dry.  Neurological:     General: No focal deficit present.     Mental Status: He is alert.  Psychiatric:        Mood and Affect: Mood normal.        Behavior: Behavior normal.     ED Results / Procedures / Treatments   Labs (all labs ordered are listed, but only abnormal results are displayed) Labs Reviewed  COMPREHENSIVE METABOLIC PANEL - Abnormal; Notable for the following components:      Result Value   Glucose, Bld 113 (*)    Creatinine, Ser 1.35 (*)    Calcium 8.6 (*)    Albumin 3.3 (*)    Alkaline Phosphatase 166 (*)    GFR, Estimated 54 (*)    All other components within normal limits  CBC - Abnormal; Notable for the following components:   RBC 3.81 (*)    Hemoglobin 11.8 (*)    HCT 37.5 (*)    All other components within normal limits  URINALYSIS, ROUTINE W REFLEX MICROSCOPIC - Abnormal; Notable for the following components:   Hgb urine dipstick SMALL (*)    Protein, ur 30 (*)    All other components within normal limits  LIPASE, BLOOD    EKG None  Radiology CT ABDOMEN PELVIS W CONTRAST  Result Date: 10/10/2023 CLINICAL DATA:  Right lower quadrant pain. EXAM: CT ABDOMEN AND PELVIS WITH CONTRAST TECHNIQUE: Multidetector CT  imaging of the abdomen and pelvis was performed using the standard protocol following bolus administration of intravenous contrast. RADIATION DOSE REDUCTION: This exam was performed according to the departmental dose-optimization program which includes automated exposure control, adjustment of the mA and/or kV according to patient size and/or use of iterative reconstruction technique. CONTRAST:  75mL OMNIPAQUE IOHEXOL 350 MG/ML SOLN COMPARISON:  CT 04/24/2023. FINDINGS: Lower chest: There  is some linear opacity lung bases likely scar or atelectasis. Slightly patulous lower esophagus. Hepatobiliary: No focal liver abnormality is seen. Status post cholecystectomy. No biliary dilatation. Pancreas: Unremarkable. No pancreatic ductal dilatation or surrounding inflammatory changes. Spleen: Normal in size without focal abnormality. Adrenals/Urinary Tract: Adrenal glands are preserved. No enhancing renal mass or collecting system dilatation. There is tiny low-attenuation lesion along the lower pole left kidney, likely a benign Bosniak 2 lesion. No specific imaging follow-up. Preserved contours of the urinary bladder. Enlarged prostate. There is area of high density along the left side of the prostate anteriorly on series 3, image 71. Please correlate with the patient's PSA. Stomach/Bowel: Large bowel on this non oral contrast examination has scattered stool. Redundant course of the sigmoid colon. Scattered colonic diverticula. Along the ascending colon there is areas of wall thickening with stranding and diverticula extending medial. This has separate from the normal caliber appendix and would be consistent with a right-sided diverticulitis. No complicating features of obstruction, free air or free fluid. Vascular/Lymphatic: Aortic atherosclerosis. No enlarged abdominal or pelvic lymph nodes. Reproductive: Heterogeneous prostate with potential enhancing nodule along the left side. Please correlate with patient's PSA Other:  No free intra-abdominal air or free fluid. Small umbilical fat containing hernia. Musculoskeletal: Extensive surgical changes along the lumbar spine with laminectomy and hardware. Soft tissue thickening and edema again seen. Appearance is similar to the prior CT. Degenerative changes of the sacroiliac joints. Hardware along the SI joints is seen and has some lucency along the margins of the screws. Again unchanged. IMPRESSION: Again changes of right-sided colonic diverticulitis with wall thickening, stranding. No complicating features. Recommend follow up to confirm clearance and exclude secondary pathology. Potentially enhancing nodule along the prostate. Please correlate with patient's PSA. Extensive surgical changes along the spine with hardware and laminectomy. Again lucency along the sacral volts is seen as well as the screws at L3. Please correlate for any clinical evidence of infection or loosening. Appearance is similar to previous. Electronically Signed   By: Karen Kays M.D.   On: 10/10/2023 14:37    Procedures Procedures    Medications Ordered in ED Medications  HYDROmorphone (DILAUDID) injection 1 mg (has no administration in time range)  ondansetron (ZOFRAN) injection 4 mg (has no administration in time range)  cefTRIAXone (ROCEPHIN) 2 g in sodium chloride 0.9 % 100 mL IVPB (has no administration in time range)    And  metroNIDAZOLE (FLAGYL) IVPB 500 mg (has no administration in time range)  morphine (PF) 4 MG/ML injection 4 mg (4 mg Intravenous Given 10/10/23 1247)  pantoprazole (PROTONIX) injection 40 mg (40 mg Intravenous Given 10/10/23 1249)  iohexol (OMNIPAQUE) 350 MG/ML injection 75 mL (75 mLs Intravenous Contrast Given 10/10/23 1424)    ED Course/ Medical Decision Making/ A&P Clinical Course as of 10/10/23 1538  Thu Oct 10, 2023  [id number] 77 year old male here with acute onset of midepigastric abdominal pain that started last evening.  He is acutely tender but without pulsatile  mass.  Getting lab work pain medicine PPI and imaging.  Disposition per results of testing [MB]    Clinical Course User Index [MB] Terrilee Files, MD                                 Medical Decision Making Amount and/or Complexity of Data Reviewed Independent Historian: spouse Labs: ordered. Decision-making details documented in ED Course. Radiology: ordered and independent interpretation performed.  Decision-making details documented in ED Course. ECG/medicine tests: ordered and independent interpretation performed. Decision-making details documented in ED Course.  Risk Prescription drug management.   This patient presents to the ED for concern of abdominal pain, this involves an extensive number of treatment options, and is a complaint that carries with it a high risk of complications and morbidity.  The differential diagnosis includes diverticulitis, gastroenteritis, pancreatitis, appendicitis, etc  77 year old male presents to the ED due to mid abdominal pain that started yesterday.  History of diverticulitis.  No nausea, vomiting, or diarrhea.  Upon arrival, stable vitals.  Patient in no acute distress.  Abdomen soft with diffuse tenderness with voluntary guarding. Routine labs ordered.  CT abdomen ordered. IV morphine given. Low suspicion for aortic dissection.   CBC significant for anemia with hemoglobin 11.8.  No leukocytosis.  Lipase normal.  Low suspicion for pancreatitis.  CMP significant for elevated creatinine at 1.35 appears to be around patient's baseline.  Elevated alk phos at 166.  UA negative for signs of infection.  CT abdomen personally reviewed and interpreted which demonstrates right-sided colonic diverticulitis with wall thickening and stranding.  No complicating features.  Also demonstrates a enhancing nodule along the prostate.  Also demonstrates extensive surgical changes along the spine with hardware laminectomy with lucency along the sacral bolts which has been  seen previously.  Lower suspicion for infection given stable appearance and no infectious symptoms.   2:52 PM reassessed patient at bedside.  Patient admits to some improvement in pain.  Discussed CT results with patient and incidental findings.  Offered admission for diverticulitis however, patient notes he prefers to try oral antibiotics and be discharged home.  Advised patient to follow-up with his GI doctor for further evaluation.  And his neurosurgeon for the incidental findings on CT scan. Patient stable for discharge.   3:38 PM Informed by RN that patient was actively vomiting.  Reported to bedside.  I had a long discussion with patient and wife at bedside.  Patient now agreeable for admission.  IV Dilaudid and Zofran given. IV antibiotics started. Patient handed off to [facility] pending admission.   Discussed with Dr. Charm Barges who evaluated patient at bedside and agrees with assessment and plan.   Final Clinical Impression(s) / ED Diagnoses Final diagnoses:  Diverticulitis    Rx / DC Orders ED Discharge Orders          Ordered    ciprofloxacin (CIPRO) 500 MG tablet  Every 12 hours        10/10/23 1455    metroNIDAZOLE (FLAGYL) 500 MG tablet  2 times daily        10/10/23 1455    HYDROcodone-acetaminophen (NORCO/VICODIN) 5-325 MG tablet  Every 4 hours PRN        10/10/23 1455              Mannie Stabile, PA-C 10/10/23 1532    Mannie Stabile, PA-C 10/10/23 1538    Terrilee Files, MD 10/10/23 1712

## 2023-10-10 NOTE — ED Notes (Signed)
Pts cardiac monitoring verified with CCMD.

## 2023-10-10 NOTE — H&P (Addendum)
History and Physical  Bryan [name]:[id number] DOB: 1946-06-19 DOA: 10/10/2023  Referring physician: Achille Rich, PA-EDP  PCP: Raymon Mutton., FNP  Outpatient Specialists: Neurosurgery, rheumatology. Patient coming from: Home.  Chief Complaint: Right lower quadrant abdominal pain.  HPI: Bryan [name] is a 77 y.o. male with medical history significant for rheumatoid arthritis, history of Hodgkin lymphoma, on Rituxan infusion every 2 weeks by outside rheumatology, paroxysmal A-fib on anticoagulation edoxaban, BPH, OSA on CPAP, hypertension, history of multiple pulmonary emboli, essential tremors on primidone, who presents to the ED due to sudden onset right lower quadrant abdominal pain last night.  Last episode of diverticulitis was 7 months ago.  Denies nausea vomiting or diarrhea.  No reported subjective fevers or chills.  He presented to the ED today for further evaluation.  In the ED, tenderness of right lower abdomen on exam.  CT abdomen and pelvis revealed acute right-sided colonic diverticulitis.  The patient was started on empiric IV antibiotics.  TR[facility], hospitalist service, was asked to admit.  Admitted to telemetry surgical unit as inpatient status for IV antibiotics in the setting of immunosuppressant Rituxan.  ED Course: Temperature 99.6.  BP 151/74, pulse 80, respiratory 22, O2 saturation 99% on room air.  Lab studies notable for WBC 7.4, hemoglobin 11.8, platelet count 206.  Serum glucose 113, creatinine 1.35, alkaline phosphatase 166, albumin 3.3, GFR 54.  Review of Systems: Review of systems as noted in the HPI. All other systems reviewed and are negative.   Past Medical History:  Diagnosis Date   Anemia    Arthritis    hands, knees, cervical area. Back pain. Rheumatoid arthritis- weekly injections.   BPH (benign prostatic hypertrophy)    Cancer (HCC)    Diabetes (HCC)    Essential tremor    GAD (generalized anxiety disorder) 12/05/2021   GERD (gastroesophageal  reflux disease)    reports for indigestion he uses mustard    Hearing deficit    wears hearing aids bilateral   HTN (hypertension)    Obesity    OSA (obstructive sleep apnea)    PAF (paroxysmal atrial fibrillation) (HCC)    Refusal of blood transfusions as patient is Jehovah's Witness    Rheumatoid arthritis (HCC)    Right bundle branch block    history of   Seizures (HCC)    AS A CHILD. only esential tremors now.   Sleep apnea    cpap - settings at 9 per patient    Ulcer    Past Surgical History:  Procedure Laterality Date   ABDOMINAL EXPOSURE N/A 07/30/2022   Procedure: ABDOMINAL EXPOSURE;  Surgeon: Cephus Shelling, MD;  Location: [facility] OR;  Service: Vascular;  Laterality: N/A;   ANTERIOR CERVICAL DECOMP/DISCECTOMY FUSION N/A 03/30/2013   Procedure: ANTERIOR CERVICAL DECOMPRESSION/DISCECTOMY FUSION 2 LEVELS;  Surgeon: Clydene Fake, MD;  Location: [facility];  Service: Neurosurgery;  Laterality: N/A;  C4-5 C5-6 Anterior cervical decompression/diskectomy/fusion/Allograft/Plate   ANTERIOR LUMBAR FUSION N/A 07/30/2022   Procedure: Anterior Lumbar  Interbody Fusion  - Lumbar five-sacral one posterior augmentation with iliac screws and O- arm;  Surgeon: Donalee Citrin, MD;  Location: [facility] OR;  Service: Neurosurgery;  Laterality: N/A;   CHOLECYSTECTOMY N/A 12/07/2013   Procedure: LAPAROSCOPIC CHOLECYSTECTOMY WITH INTRAOPERATIVE CHOLANGIOGRAM;  Surgeon: Ardeth Sportsman, MD;  Location: [facility] ORS;  Service: General;  Laterality: N/A;   COLONOSCOPY     DECOMPRESSIVE LUMBAR LAMINECTOMY LEVEL 2 N/A 02/15/2015   Procedure: COMPLETE DECOMPRESSIVE LUMBAR LAMINECTOMY L4-L5/ FORAMINOTOMY TO  L4 NERVE ROOT AND L5 NERVE ROOT BILATERALLY;  Surgeon: Ranee Gosselin, MD;  Location: [facility] ORS;  Service: Orthopedics;  Laterality: N/A;   EYE SURGERY     right, growth excision   LAMINECTOMY WITH POSTERIOR LATERAL ARTHRODESIS LEVEL 1 N/A 07/30/2022   Procedure: LAMINECTOMY WITH POSTERIOR LATERAL ARTHRODESIS LUMBAR FIVE  SACRAL ONE;  Surgeon: Donalee Citrin, MD;  Location: [facility] OR;  Service: Neurosurgery;  Laterality: N/A;   LUMBAR LAMINECTOMY/DECOMPRESSION MICRODISCECTOMY Left 01/18/2016   Procedure:  DECOMPRESSION L4-L5 MICRODISCECTOMY L4-L5 ON LEFT FOR SPINAL STENOSIS;  Surgeon: Ranee Gosselin, MD;  Location: [facility] ORS;  Service: Orthopedics;  Laterality: Left;   PAROTIDECTOMY Left 01/24/2018   Procedure: INCISIONAL BIOPSY OF LEFT PAROTID    MASS;  Surgeon: Graylin Shiver, MD;  Location: [facility] OR;  Service: ENT;  Laterality: Left;   SPINE SURGERY     TONSILLECTOMY     VASECTOMY     WRIST GANGLION EXCISION Left     Social History:  reports that he quit smoking about 50 years ago. His smoking use included cigarettes. He started smoking about 64 years ago. He has a 7 pack-year smoking history. He has never used smokeless tobacco. He reports current alcohol use. He reports that he does not use drugs.   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 Authorizing Provider  acetaminophen (TYLENOL) 325 MG tablet Take 1-2 tablets (325-650 mg total) by mouth every 4 (four) hours as needed for  mild pain. 08/09/22  Yes Love, Evlyn Kanner, PA-C  amLODipine (NORVASC) 5 MG tablet Take 5 mg by mouth daily. 06/25/23  Yes [provider]  ciprofloxacin (CIPRO) 500 MG tablet Take 1 tablet (500 mg total) by mouth every 12 (twelve) hours for 5 days. 10/10/23 10/15/23 Yes Aberman, Merla Riches, PA-C  edoxaban (SAVAYSA) 30 MG TABS tablet Take 30 mg by mouth daily. 06/26/23  Yes [provider]  HYDROcodone-acetaminophen (NORCO/VICODIN) 5-325 MG tablet Take 1 tablet by mouth every 4 (four) hours as needed. 10/10/23  Yes Aberman, Merla Riches, PA-C  metoprolol (TOPROL-XL) 200 MG 24 hr tablet Take 100 mg by mouth daily.   Yes [provider]  metroNIDAZOLE (FLAGYL) 500 MG tablet Take 1 tablet (500 mg total) by mouth 2 (two) times daily for 5 days. 10/10/23 10/15/23 Yes Aberman, Merla Riches, PA-C  Multiple Vitamin (MULTIVITAMIN WITH MINERALS) TABS tablet Take 1 tablet by mouth daily.   Yes [provider]  polyethylene glycol (MIRALAX / GLYCOLAX) 17 g packet Take 17 g by mouth daily as needed for mild constipation.   Yes [provider]  primidone (MYSOLINE) 50 MG tablet Take 100 mg by mouth 2 (two) times daily.   Yes [provider]  riTUXimab (RITUXAN) 500 MG/50ML injection Inject into the vein See admin instructions. Unknown dosage, unconfirmed frequency. See notes.   Yes [provider]  Tamsulosin HCl (FLOMAX) 0.4 MG CAPS Take 0.8 mg by mouth at bedtime.   Yes [provider]  nitroGLYCERIN (NITROSTAT) 0.4 MG SL tablet Place 0.4 mg under the tongue every 5 (five) minutes as needed for chest pain. Patient not taking: Reported on 10/10/2023    [provider]    Physical Exam: BP (!) 151/73   Pulse 79   Temp 99.6 F (37.6 C) (Oral)   Resp (!) 26   Ht 5\' 5"  (1.651 m)   Wt 99.8 kg   SpO2 99%   BMI 36.61 kg/m   General: 77 y.o. year-old male well developed well nourished in no acute distress.  Alert and oriented  x3. Cardiovascular: Regular rate and rhythm with no rubs or gallops.  No thyromegaly or JVD noted.  No lower extremity edema. 2/4 pulses in all 4 extremities. Respiratory: Clear to auscultation with no wheezes or rales. Good inspiratory effort. Abdomen: Soft right lower quadrant tenderness with palpation.  Nondistended with normal bowel sounds x4 quadrants. Muskuloskeletal: No cyanosis, clubbing or edema noted bilaterally Neuro: CN II-XII intact, strength, sensation, reflexes Skin: No ulcerative lesions noted or rashes Psychiatry: Judgement and insight appear normal. Mood is appropriate for condition and setting          Labs on Admission:  Basic Metabolic Panel: Recent Labs  Lab 10/10/23 1110  NA 137  K 4.6  CL 106  CO2 25  GLUCOSE 113*  BUN 22  CREATININE 1.35*  CALCIUM 8.6*   Liver Function Tests: Recent Labs  Lab 10/10/23 1110  AST 22  ALT 15  ALKPHOS 166*  BILITOT 0.3  PROT 6.5  ALBUMIN 3.3*   Recent Labs  Lab 10/10/23 1110  LIPASE 28   No results for input(s): "AMMONIA" in the last 168 hours. CBC: Recent Labs  Lab 10/10/23 1110  WBC 7.4  HGB 11.8*  HCT 37.5*  MCV 98.4  PLT 206   Cardiac Enzymes: No results for input(s): "CKTOTAL", "CKMB", "CKMBINDEX", "TROPONINI" in the last 168 hours.  BNP (last 3 results) No results for input(s): "BNP" in the last 8760 hours.  ProBNP (last 3 results) No results for input(s): "PROBNP" in the last 8760 hours.  CBG: No results for input(s): "GLUCAP" in the last 168 hours.  Radiological Exams on Admission: CT ABDOMEN PELVIS W CONTRAST  Result Date: 10/10/2023 CLINICAL DATA:  Right lower quadrant pain. EXAM: CT ABDOMEN AND PELVIS WITH CONTRAST TECHNIQUE: Multidetector CT imaging of the abdomen and pelvis was performed using the standard protocol following bolus administration of intravenous contrast. RADIATION DOSE REDUCTION: This exam was performed according to the departmental dose-optimization program which  includes automated exposure control, adjustment of the mA and/or kV according to patient size and/or use of iterative reconstruction technique. CONTRAST:  75mL OMNIPAQUE IOHEXOL 350 MG/ML SOLN COMPARISON:  CT 04/24/2023. FINDINGS: Lower chest: There is some linear opacity lung bases likely scar or atelectasis. Slightly patulous lower esophagus. Hepatobiliary: No focal liver abnormality is seen. Status post cholecystectomy. No biliary dilatation. Pancreas: Unremarkable. No pancreatic ductal dilatation or surrounding inflammatory changes.  Spleen: Normal in size without focal abnormality. Adrenals/Urinary Tract: Adrenal glands are preserved. No enhancing renal mass or collecting system dilatation. There is tiny low-attenuation lesion along the lower pole left kidney, likely a benign Bosniak 2 lesion. No specific imaging follow-up. Preserved contours of the urinary bladder. Enlarged prostate. There is area of high density along the left side of the prostate anteriorly on series 3, image 71. Please correlate with the patient's PSA. Stomach/Bowel: Large bowel on this non oral contrast examination has scattered stool. Redundant course of the sigmoid colon. Scattered colonic diverticula. Along the ascending colon there is areas of wall thickening with stranding and diverticula extending medial. This has separate from the normal caliber appendix and would be consistent with a right-sided diverticulitis. No complicating features of obstruction, free air or free fluid. Vascular/Lymphatic: Aortic atherosclerosis. No enlarged abdominal or pelvic lymph nodes. Reproductive: Heterogeneous prostate with potential enhancing nodule along the left side. Please correlate with patient's PSA Other: No free intra-abdominal air or free fluid. Small umbilical fat containing hernia. Musculoskeletal: Extensive surgical changes along the lumbar spine with laminectomy and hardware. Soft tissue thickening and edema again seen. Appearance is  similar to the prior CT. Degenerative changes of the sacroiliac joints. Hardware along the SI joints is seen and has some lucency along the margins of the screws. Again unchanged. IMPRESSION: Again changes of right-sided colonic diverticulitis with wall thickening, stranding. No complicating features. Recommend follow up to confirm clearance and exclude secondary pathology. Potentially enhancing nodule along the prostate. Please correlate with patient's PSA. Extensive surgical changes along the spine with hardware and laminectomy. Again lucency along the sacral volts is seen as well as the screws at L3. Please correlate for any clinical evidence of infection or loosening. Appearance is similar to previous. Electronically Signed   By: Karen Kays M.D.   On: 10/10/2023 14:37    EKG: I independently viewed the EKG done and my findings are as followed: Sinus rhythm rate of 64.  Nonspecific ST T changes.  QTc 429.  RBBB.  Assessment/Plan Present on Admission:  Acute diverticulitis  Principal Problem:   Acute diverticulitis  Recurrent acute diverticulitis in the setting of immunosuppressants The patient is on Rituxan for non-Hodgkin lymphoma and rheumatoid arthritis Last episode of diverticulitis was 7 months ago Presented with sudden onset right lower quadrant abdominal pain last night. Right-sided colonic diverticulitis seen on CT scan Continue empiric IV antibiotic started in the ED, Rocephin and IV Flagyl. Monitor fever curve and WBC Continue pain management Gentle IV fluid hydration Will need to follow-up with GI outpatient for a colonoscopy, in 6 to 8 weeks post resolution of the acute diverticulitis, to exclude secondary pathology.  Anemia of chronic disease, stable Hemoglobin is stable Monitor  CKD 3B At baseline Monitor urine output and avoid nephrotoxic agents.  BPH Resume home Tamsulosin Monitor urine output  Chronic HFpEF Euvolemic on exam Last 2D echo done on 01/20/2022  revealed LVEF 55 to 60% with severely elevated pulmonary artery systolic pressure, tricuspid valve regurgitation, moderate. Monitor strict I's and O's and daily weight  OSA Resume home CPAP nightly  Physical debility PT OT assessment Fall precautions  Paroxysmal A-fib History of multiple pulmonary emboli Resume home edoxaban  Hypertension BP is not at goal, elevated Resume home amlodipine Monitor vital signs.  Obesity BMI 36 Recommend weight loss outpatient with regular physical activity and healthy diet.  Incidental findings on CT scan: -Potentially enhancing nodule along the prostate. Please correlate with patient's PSA.  -Extensive surgical  changes along the spine with hardware and laminectomy. Again lucency along the sacral volts is seen as well as the screws at L3. Please correlate for any clinical evidence of infection or loosening. Appearance is similar to previous.    Time: 75 minutes.    DVT prophylaxis: Home edoxaban  Code Status: Full code  Family Communication: None at bedside.  Disposition Plan: Admitted to telemetry surgical unit  Consults called: None.  Admission status: Inpatient status.   Status is: Inpatient The patient requires at least 2 midnights for further evaluation and treatment of present condition.   Darlin Drop MD [facility] Pager [phone]  If 7PM-7AM, please contact night-coverage [email or web address] Password [facility]  10/10/2023, 7:39 PM

## 2023-10-10 NOTE — ED Provider Notes (Signed)
Patient admitted to Dr. Margo Aye with [facility].    Achille Rich, PA-C 10/10/23 Ines Bloomer, MD 10/11/23 2253

## 2023-10-10 NOTE — ED Notes (Signed)
ED TO INPATIENT HANDOFF REPORT  ED Nurse Name and Phone #: Cleda Mccreedy  S Name/Age/Gender Bryan [name] 77 y.o. male Room/Bed: 002C/002C  Code Status   Code Status: Prior  Home/SNF/Other Home Patient oriented to: self, place, time, and situation Is this baseline? Yes   Triage Complete: Triage complete  Chief Complaint Acute diverticulitis [K57.92]  Triage Note Pt c.o mid abd pain since yesterday. Hx of diverticulitis. Denies any blood stools. No n.v   Allergies Allergies  Allergen Reactions   Terazosin Other (See Comments)    Severe hypotension   Adalimumab Other (See Comments)    (Humira) Lymphoma   Gabapentin Other (See Comments)    confusion, altered mental status   Statins Other (See Comments)    Memory issues   Ampicillin Nausea Only    Tolerated ancef on 07-30-22 without issue intraoperativelty   Lisinopril Cough   Other Other (See Comments)    BLOOD PRODUCT REFUSAL (patient is a Jehovah's Witness)     Level of Care/Admitting Diagnosis ED Disposition     ED Disposition  Admit   Condition  --   Comment  Hospital Area: [facility] [[id number]]  Level of Care: Telemetry Surgical [105]  May admit patient to [facility] or [facility] if equivalent level of care is available:: Yes  Covid Evaluation: Asymptomatic - no recent exposure (last 10 days) testing not required  Diagnosis: Acute diverticulitis [[id number]]  Admitting Physician: Darlin Drop [[id number]]  Attending Physician: Darlin Drop [[id number]]  Certification:: I certify this patient will need inpatient services for at least 2 midnights  Expected Medical Readiness: 10/12/2023          B Medical/Surgery History Past Medical History:  Diagnosis Date   Anemia    Arthritis    hands, knees, cervical area. Back pain. Rheumatoid arthritis- weekly injections.   BPH (benign prostatic hypertrophy)    Cancer (HCC)    Diabetes (HCC)    Essential tremor    GAD (generalized anxiety  disorder) 12/05/2021   GERD (gastroesophageal reflux disease)    reports for indigestion he uses mustard    Hearing deficit    wears hearing aids bilateral   HTN (hypertension)    Obesity    OSA (obstructive sleep apnea)    PAF (paroxysmal atrial fibrillation) (HCC)    Refusal of blood transfusions as patient is Jehovah's Witness    Rheumatoid arthritis (HCC)    Right bundle branch block    history of   Seizures (HCC)    AS A CHILD. only esential tremors now.   Sleep apnea    cpap - settings at 9 per patient    Ulcer    Past Surgical History:  Procedure Laterality Date   ABDOMINAL EXPOSURE N/A 07/30/2022   Procedure: ABDOMINAL EXPOSURE;  Surgeon: Cephus Shelling, MD;  Location: [facility] OR;  Service: Vascular;  Laterality: N/A;   ANTERIOR CERVICAL DECOMP/DISCECTOMY FUSION N/A 03/30/2013   Procedure: ANTERIOR CERVICAL DECOMPRESSION/DISCECTOMY FUSION 2 LEVELS;  Surgeon: Clydene Fake, MD;  Location: [facility];  Service: Neurosurgery;  Laterality: N/A;  C4-5 C5-6 Anterior cervical decompression/diskectomy/fusion/Allograft/Plate   ANTERIOR LUMBAR FUSION N/A 07/30/2022   Procedure: Anterior Lumbar  Interbody Fusion  - Lumbar five-sacral one posterior augmentation with iliac screws and O- arm;  Surgeon: Donalee Citrin, MD;  Location: [facility] OR;  Service: Neurosurgery;  Laterality: N/A;   CHOLECYSTECTOMY N/A 12/07/2013   Procedure: LAPAROSCOPIC CHOLECYSTECTOMY WITH INTRAOPERATIVE CHOLANGIOGRAM;  Surgeon: Ardeth Sportsman, MD;  Location: [facility] ORS;  Service: General;  Laterality: N/A;   COLONOSCOPY     DECOMPRESSIVE LUMBAR LAMINECTOMY LEVEL 2 N/A 02/15/2015   Procedure: COMPLETE DECOMPRESSIVE LUMBAR LAMINECTOMY L4-L5/ FORAMINOTOMY TO L4 NERVE ROOT AND L5 NERVE ROOT BILATERALLY;  Surgeon: Ranee Gosselin, MD;  Location: [facility] ORS;  Service: Orthopedics;  Laterality: N/A;   EYE SURGERY     right, growth excision   LAMINECTOMY WITH POSTERIOR LATERAL ARTHRODESIS LEVEL 1 N/A 07/30/2022   Procedure: LAMINECTOMY  WITH POSTERIOR LATERAL ARTHRODESIS LUMBAR FIVE SACRAL ONE;  Surgeon: Donalee Citrin, MD;  Location: [facility] OR;  Service: Neurosurgery;  Laterality: N/A;   LUMBAR LAMINECTOMY/DECOMPRESSION MICRODISCECTOMY Left 01/18/2016   Procedure:  DECOMPRESSION L4-L5 MICRODISCECTOMY L4-L5 ON LEFT FOR SPINAL STENOSIS;  Surgeon: Ranee Gosselin, MD;  Location: [facility] ORS;  Service: Orthopedics;  Laterality: Left;   PAROTIDECTOMY Left 01/24/2018   Procedure: INCISIONAL BIOPSY OF LEFT PAROTID    MASS;  Surgeon: Graylin Shiver, MD;  Location: [facility] OR;  Service: ENT;  Laterality: Left;   SPINE SURGERY     TONSILLECTOMY     VASECTOMY     WRIST GANGLION EXCISION Left      A IV Location/Drains/Wounds Patient Lines/Drains/Airways Status     Active Line/Drains/Airways     Name Placement date Placement time Site Days   Peripheral IV 10/10/23 20 G Anterior;Distal;Right;Upper Arm 10/10/23  1246  Arm  less than 1            Intake/Output Last 24 hours No intake or output data in the 24 hours ending 10/10/23 1920  Labs/Imaging Results for orders placed or performed during the hospital encounter of 10/10/23 (from the past 48 hour(s))  Urinalysis, Routine w reflex microscopic -Urine, Clean Catch     Status: Abnormal   Collection Time: 10/10/23 11:07 AM  Result Value Ref Range   Color, Urine YELLOW YELLOW   APPearance CLEAR CLEAR   Specific Gravity, Urine 1.017 1.005 - 1.030   pH 5.0 5.0 - 8.0   Glucose, UA NEGATIVE NEGATIVE mg/dL   Hgb urine dipstick SMALL (A) NEGATIVE   Bilirubin Urine NEGATIVE NEGATIVE   Ketones, ur NEGATIVE NEGATIVE mg/dL   Protein, ur 30 (A) NEGATIVE mg/dL   Nitrite NEGATIVE NEGATIVE   Leukocytes,Ua NEGATIVE NEGATIVE   RBC / HPF 0-5 0 - 5 RBC/hpf   WBC, UA 0-5 0 - 5 WBC/hpf   Bacteria, UA NONE SEEN NONE SEEN   Squamous Epithelial / HPF 0-5 0 - 5 /HPF   Mucus PRESENT     Comment: Performed at [facility], [address], [place], [place] [address]  Lipase, blood     Status: None    Collection Time: 10/10/23 11:10 AM  Result Value Ref Range   Lipase 28 11 - 51 U/L    Comment: Performed at [facility], [address], [place], [place] [address]  Comprehensive metabolic panel     Status: Abnormal   Collection Time: 10/10/23 11:10 AM  Result Value Ref Range   Sodium 137 135 - 145 mmol/L   Potassium 4.6 3.5 - 5.1 mmol/L   Chloride 106 98 - 111 mmol/L   CO2 25 22 - 32 mmol/L   Glucose, Bld 113 (H) 70 - 99 mg/dL    Comment: Glucose reference range applies only to samples taken after fasting for at least 8 hours.   BUN 22 8 - 23 mg/dL   Creatinine, Ser 4.01 (H) 0.61 - 1.24 mg/dL   Calcium 8.6 (  L) 8.9 - 10.3 mg/dL   Total Protein 6.5 6.5 - 8.1 g/dL   Albumin 3.3 (L) 3.5 - 5.0 g/dL   AST 22 15 - 41 U/L   ALT 15 0 - 44 U/L   Alkaline Phosphatase 166 (H) 38 - 126 U/L   Total Bilirubin 0.3 <1.2 mg/dL   GFR, Estimated 54 (L) >60 mL/min    Comment: (NOTE) Calculated using the CKD-EPI Creatinine Equation (2021)    Anion gap 6 5 - 15    Comment: Performed at [facility], [address] [place], [place] [address]  CBC     Status: Abnormal   Collection Time: 10/10/23 11:10 AM  Result Value Ref Range   WBC 7.4 4.0 - 10.5 K/uL   RBC 3.81 (L) 4.22 - 5.81 MIL/uL   Hemoglobin 11.8 (L) 13.0 - 17.0 g/dL   HCT 09.8 (L) 11.9 - 14.7 %   MCV 98.4 80.0 - 100.0 fL   MCH 31.0 26.0 - 34.0 pg   MCHC 31.5 30.0 - 36.0 g/dL   RDW 82.9 56.2 - 13.0 %   Platelets 206 150 - 400 K/uL   nRBC 0.0 0.0 - 0.2 %    Comment: Performed at [facility], [address] [place], [place] [address]   CT ABDOMEN PELVIS W CONTRAST  Result Date: 10/10/2023 CLINICAL DATA:  Right lower quadrant pain. EXAM: CT ABDOMEN AND PELVIS WITH CONTRAST TECHNIQUE: Multidetector CT imaging of the abdomen and pelvis was performed using the standard protocol following bolus administration of intravenous contrast. RADIATION DOSE REDUCTION: This exam was performed according to the departmental  dose-optimization program which includes automated exposure control, adjustment of the mA and/or kV according to patient size and/or use of iterative reconstruction technique. CONTRAST:  75mL OMNIPAQUE IOHEXOL 350 MG/ML SOLN COMPARISON:  CT 04/24/2023. FINDINGS: Lower chest: There is some linear opacity lung bases likely scar or atelectasis. Slightly patulous lower esophagus. Hepatobiliary: No focal liver abnormality is seen. Status post cholecystectomy. No biliary dilatation. Pancreas: Unremarkable. No pancreatic ductal dilatation or surrounding inflammatory changes. Spleen: Normal in size without focal abnormality. Adrenals/Urinary Tract: Adrenal glands are preserved. No enhancing renal mass or collecting system dilatation. There is tiny low-attenuation lesion along the lower pole left kidney, likely a benign Bosniak 2 lesion. No specific imaging follow-up. Preserved contours of the urinary bladder. Enlarged prostate. There is area of high density along the left side of the prostate anteriorly on series 3, image 71. Please correlate with the patient's PSA. Stomach/Bowel: Large bowel on this non oral contrast examination has scattered stool. Redundant course of the sigmoid colon. Scattered colonic diverticula. Along the ascending colon there is areas of wall thickening with stranding and diverticula extending medial. This has separate from the normal caliber appendix and would be consistent with a right-sided diverticulitis. No complicating features of obstruction, free air or free fluid. Vascular/Lymphatic: Aortic atherosclerosis. No enlarged abdominal or pelvic lymph nodes. Reproductive: Heterogeneous prostate with potential enhancing nodule along the left side. Please correlate with patient's PSA Other: No free intra-abdominal air or free fluid. Small umbilical fat containing hernia. Musculoskeletal: Extensive surgical changes along the lumbar spine with laminectomy and hardware. Soft tissue thickening and edema  again seen. Appearance is similar to the prior CT. Degenerative changes of the sacroiliac joints. Hardware along the SI joints is seen and has some lucency along the margins of the screws. Again unchanged. IMPRESSION: Again changes of right-sided colonic diverticulitis with wall thickening, stranding. No complicating features. Recommend  follow up to confirm clearance and exclude secondary pathology. Potentially enhancing nodule along the prostate. Please correlate with patient's PSA. Extensive surgical changes along the spine with hardware and laminectomy. Again lucency along the sacral volts is seen as well as the screws at L3. Please correlate for any clinical evidence of infection or loosening. Appearance is similar to previous. Electronically Signed   By: Karen Kays M.D.   On: 10/10/2023 14:37    Pending Labs Unresulted Labs (From admission, onward)    None       Vitals/Pain Today's Vitals   10/10/23 1645 10/10/23 1900 10/10/23 1900 10/10/23 1901  BP: (!) 188/70   (!) 151/73  Pulse: 76   79  Resp: 17   (!) 26  Temp:   99.6 F (37.6 C)   TempSrc:   Oral   SpO2: 91%   99%  Weight:      Height:      PainSc:  0-No pain      Isolation Precautions No active isolations  Medications Medications  cefTRIAXone (ROCEPHIN) 2 g in sodium chloride 0.9 % 100 mL IVPB (has no administration in time range)    And  metroNIDAZOLE (FLAGYL) IVPB 500 mg (has no administration in time range)  morphine (PF) 4 MG/ML injection 4 mg (4 mg Intravenous Given 10/10/23 1247)  pantoprazole (PROTONIX) injection 40 mg (40 mg Intravenous Given 10/10/23 1249)  iohexol (OMNIPAQUE) 350 MG/ML injection 75 mL (75 mLs Intravenous Contrast Given 10/10/23 1424)  HYDROmorphone (DILAUDID) injection 1 mg (1 mg Intravenous Given 10/10/23 1550)  ondansetron (ZOFRAN) injection 4 mg (4 mg Intravenous Given 10/10/23 1549)  cefTRIAXone (ROCEPHIN) 2 g in sodium chloride 0.9 % 100 mL IVPB (0 g Intravenous Stopped 10/10/23 1744)     And  metroNIDAZOLE (FLAGYL) IVPB 500 mg (0 mg Intravenous Stopped 10/10/23 1745)    Mobility walks     Focused Assessments    R Recommendations: See Admitting Provider Note  Report given to:   Additional Notes:  Pt states his pain has improved at this time, currently 0/10

## 2023-10-10 NOTE — Discharge Instructions (Addendum)
It was a pleasure taking care of you today.  As discussed, your CT scan showed diverticulitis.  I am sending you home with 2 antibiotics.  Take as prescribed and finish all antibiotics.  I am also sending you home with pain medication.  Take as needed for severe pain.  Medication can cause drowsiness so do not drive or operate machinery while on the medication.  Please follow-up with your GI doctor within the next few days for a recheck.  Return to the ER for any worsening symptoms.   Please also follow-up with your neurosurgery in regards to the findings in your low back on the CT scan. And your PCP about the  nodule in your prostate.

## 2023-10-10 NOTE — ED Triage Notes (Signed)
Pt c.o mid abd pain since yesterday. Hx of diverticulitis. Denies any blood stools. No n.v

## 2023-10-10 NOTE — Progress Notes (Signed)
Patient refused Cpap for the night.. States he hasn't worn his unit at home for a while and doesn't wish to wear our equipment. Advised patient to call if he changed his mind and decided he would like to wear

## 2023-10-11 DIAGNOSIS — K5792 Diverticulitis of intestine, part unspecified, without perforation or abscess without bleeding: Secondary | ICD-10-CM | POA: Diagnosis not present

## 2023-10-11 LAB — MAGNESIUM: Magnesium: 1.8 mg/dL (ref 1.7–2.4)

## 2023-10-11 LAB — COMPREHENSIVE METABOLIC PANEL
ALT: 15 U/L (ref 0–44)
AST: 17 U/L (ref 15–41)
Albumin: 3 g/dL — ABNORMAL LOW (ref 3.5–5.0)
Alkaline Phosphatase: 127 U/L — ABNORMAL HIGH (ref 38–126)
Anion gap: 6 (ref 5–15)
BUN: 20 mg/dL (ref 8–23)
CO2: 25 mmol/L (ref 22–32)
Calcium: 8.2 mg/dL — ABNORMAL LOW (ref 8.9–10.3)
Chloride: 105 mmol/L (ref 98–111)
Creatinine, Ser: 1.38 mg/dL — ABNORMAL HIGH (ref 0.61–1.24)
GFR, Estimated: 52 mL/min — ABNORMAL LOW (ref >60)
Glucose, Bld: 94 mg/dL (ref 70–99)
Potassium: 4.4 mmol/L (ref 3.5–5.1)
Sodium: 136 mmol/L (ref 135–145)
Total Bilirubin: 0.5 mg/dL (ref <1.2)
Total Protein: 6.2 g/dL — ABNORMAL LOW (ref 6.5–8.1)

## 2023-10-11 LAB — CBC
HCT: 34.1 % — ABNORMAL LOW (ref 39.0–52.0)
Hemoglobin: 10.8 g/dL — ABNORMAL LOW (ref 13.0–17.0)
MCH: 30.5 pg (ref 26.0–34.0)
MCHC: 31.7 g/dL (ref 30.0–36.0)
MCV: 96.3 fL (ref 80.0–100.0)
Platelets: 191 10*3/uL (ref 150–400)
RBC: 3.54 MIL/uL — ABNORMAL LOW (ref 4.22–5.81)
RDW: 14.1 % (ref 11.5–15.5)
WBC: 7.6 10*3/uL (ref 4.0–10.5)
nRBC: 0 % (ref 0.0–0.2)

## 2023-10-11 LAB — PHOSPHORUS: Phosphorus: 3 mg/dL (ref 2.5–4.6)

## 2023-10-11 NOTE — Evaluation (Signed)
Occupational Therapy Evaluation Patient Details Name: Bryan [name] MRN: [id number] DOB: 27-Aug-1946 Today's Date: 10/11/2023   History of Present Illness 77 y.o. male presents to [facility] 10/10/23 w/ R sided colonic diverticulitis. PMH includes  Hodgkin lymphoma, paroxysmal A-fib on anticoagulation, RA, BPH, OSA on CPAP, hypertension, history of multiple pulmonary emboli, essential tremors on primidone, CKD, anemia, chronic HFpEF   Clinical Impression   Patient evaluated by Occupational Therapy with no further acute OT needs identified. All education has been completed and the patient has no further questions. Pt has a Sports administrator at home. Pt had a sock aide at home and states "I threw it away because I wasn't using it". Pt appears to be close to baseline PTA with balance deficits noted. Pt being seen at outpatient currently. Recommendation for mob tech to follow acutely. See below for any follow-up Occupational Therapy or equipment needs. OT to sign off. Thank you for referral.         If plan is discharge home, recommend the following: Assist for transportation    Functional Status Assessment  Patient has had a recent decline in their functional status and demonstrates the ability to make significant improvements in function in a reasonable and predictable amount of time.  Equipment Recommendations  None recommended by OT    Recommendations for Other Services       Precautions / Restrictions Precautions Precautions: Fall Restrictions Weight Bearing Restrictions: No      Mobility Bed Mobility Overal bed mobility: Needs Assistance Bed Mobility: Rolling, Supine to Sit Rolling: Supervision   Supine to sit: Supervision, Used rails, HOB elevated     General bed mobility comments: pt exiting the right side of the bed with hob 35 degrees and heavy use of bed rail to  come to sitting.    Transfers Overall transfer level: Needs assistance   Transfers: Sit to/from Stand Sit to Stand:  Supervision           General transfer comment: pt with wide base of support but able to power up without (A)      Balance Overall balance assessment: Mild deficits observed, not formally tested                                         ADL either performed or assessed with clinical judgement   ADL Overall ADL's : Needs assistance/impaired Eating/Feeding: Modified independent                   Lower Body Dressing: Supervision/safety;With adaptive equipment;Sit to/from stand Lower Body Dressing Details (indicate cue type and reason): using reacher and sock aid for LB socks Toilet Transfer: Supervision/safety           Functional mobility during ADLs: Supervision/safety       Vision Baseline Vision/History: 1 Wears glasses Ability to See in Adequate Light: 0 Adequate Patient Visual Report: No change from baseline Vision Assessment?: No apparent visual deficits     Perception         Praxis         Pertinent Vitals/Pain Pain Assessment Pain Assessment: No/denies pain     Extremity/Trunk Assessment Upper Extremity Assessment Upper Extremity Assessment: Overall WFL for tasks assessed   Lower Extremity Assessment Lower Extremity Assessment: Defer to PT evaluation   Cervical / Trunk Assessment Cervical / Trunk Assessment: Other exceptions (hx of multiple backs surgeries)   Communication  Communication Communication: No apparent difficulties   Cognition Arousal: Alert Behavior During Therapy: WFL for tasks assessed/performed Overall Cognitive Status: Impaired/Different from baseline Area of Impairment: Safety/judgement                         Safety/Judgement: Decreased awareness of deficits     General Comments: pt with balance deficits but reports "walkers are for old people"     General Comments       Exercises     Shoulder Instructions      Home Living Family/patient expects to be discharged to:: Private  residence Living Arrangements: Spouse/significant other Available Help at Discharge: Family;Available 24 hours/day Type of Home: House Home Access: Stairs to enter [employer] of Steps: 1 Entrance Stairs-Rails: None Home Layout: One level     Bathroom Shower/Tub: Chief Strategy Officer: Standard     Home Equipment: Production assistant, radio - single point   Additional Comments: has 3 dogs      Prior Functioning/Environment Prior Level of Function : Independent/Modified Independent;Driving             Mobility Comments: independent w/ no AD, reports unsteadiness and that he should use a RW ADLs Comments: indep        OT Problem List:        OT Treatment/Interventions:      OT Goals(Current goals can be found in the care plan section)    OT Frequency:      Co-evaluation              AM-PAC OT "6 Clicks" Daily Activity     Outcome Measure Help from another person eating meals?: None Help from another person taking care of personal grooming?: None Help from another person toileting, which includes using toliet, bedpan, or urinal?: None Help from another person bathing (including washing, rinsing, drying)?: None Help from another person to put on and taking off regular upper body clothing?: None Help from another person to put on and taking off regular lower body clothing?: A Little 6 Click Score: 23   End of Session Equipment Utilized During Treatment: Gait belt Nurse Communication: Mobility status;Precautions  Activity Tolerance: Patient tolerated treatment well Patient left:  (with PT starting ambulation and balance assessment)  OT Visit Diagnosis: Unsteadiness on feet (R26.81)                Time: 6962-9528 OT Time Calculation (min): 20 min Charges:  OT General Charges $OT Visit: 1 Visit OT Evaluation $OT Eval Moderate Complexity: 1 Mod   Bryan [name], OTR/L  Acute Rehabilitation Services Office: [phone] .   Mateo Flow 10/11/2023, 11:47 AM

## 2023-10-11 NOTE — Evaluation (Signed)
Physical Therapy Evaluation Patient Details Name: Bryan [name] MRN: [id number] DOB: 1946-02-16 Today's Date: 10/11/2023  History of Present Illness  77 y.o. male presents to [facility] 10/10/23 w/ R sided colonic diverticulitis. PMH includes  Hodgkin lymphoma, paroxysmal A-fib on anticoagulation, RA, BPH, OSA on CPAP, hypertension, history of multiple pulmonary emboli, essential tremors on primidone, CKD, anemia, chronic HFpEF   Clinical Impression  Pt sitting EOB upon arrival with OT present. Pt was agreeable to PT eval. Prior to admission, pt was independent with no AD. Pt reports having difficulty with balance, however, he does not want to use a RW. Pt was CGA while ambulating with no AD but would reach for UE support if available. Pt scored a 17/24 on the DGI indicating a high falls risk. Pt reports that he will not use a RW if one was provided to him, but might consider using the SP cane he has at home if necessary. Pt presents to therapy session with decreased balance and activity tolerance. Pt would benefit from acute skilled PT to address functional impairments. Pt is already set up with OP PT and plans to continue upon d/c. Acute PT to follow.          If plan is discharge home, recommend the following: A little help with walking and/or transfers;A little help with bathing/dressing/bathroom;Help with stairs or ramp for entrance   Can travel by private vehicle    Yes    Equipment Recommendations None recommended by PT     Functional Status Assessment Patient has had a recent decline in their functional status and demonstrates the ability to make significant improvements in function in a reasonable and predictable amount of time.     Precautions / Restrictions Precautions Precautions: Fall Restrictions Weight Bearing Restrictions: No      Mobility  Bed Mobility Overal bed mobility: Needs Assistance Bed Mobility: Rolling, Supine to Sit Rolling: Supervision   Supine to sit:  Supervision, Used rails, HOB elevated     General bed mobility comments: HOB elevated, heavy use of rails for UE    Transfers Overall transfer level: Needs assistance Equipment used: None Transfers: Sit to/from Stand Sit to Stand: Supervision    General transfer comment: wide BOS, supervision for safety    Ambulation/Gait Ambulation/Gait assistance: Contact guard assist Gait Distance (Feet): 180 Feet Assistive device: None Gait Pattern/deviations: Step-through pattern, Wide base of support, Shuffle       General Gait Details: increased medial/lateral sway with wide BOS, pt tends to reach for UE support if available. Mildly unsteady with dynamic gait        Balance Overall balance assessment: Needs assistance Sitting-balance support: Feet supported, No upper extremity supported Sitting balance-Leahy Scale: Good     Standing balance support: No upper extremity supported, During functional activity Standing balance-Leahy Scale: Fair      High Level Balance Comments: Slightly unsteady with DGI testing, CGA for safety w/ no LOB Standardized Balance Assessment Standardized Balance Assessment : Dynamic Gait Index   Dynamic Gait Index Level Surface: Mild Impairment Change in Gait Speed: Mild Impairment Gait with Horizontal Head Turns: Mild Impairment Gait with Vertical Head Turns: Mild Impairment Gait and Pivot Turn: Normal Step Over Obstacle: Mild Impairment Step Around Obstacles: Mild Impairment Steps: Mild Impairment Total Score: 17       Pertinent Vitals/Pain Pain Assessment Pain Assessment: No/denies pain    Home Living Family/patient expects to be discharged to:: Private residence Living Arrangements: Spouse/significant other Available Help at Discharge: Family;Available 24  hours/day Type of Home: House Home Access: Stairs to enter Entrance Stairs-Rails: None Entrance Stairs-Number of Steps: 1   Home Layout: One level Home Equipment: Production assistant, radio  - single point Additional Comments: has 3 dogs    Prior Function Prior Level of Function : Independent/Modified Independent;Driving    Mobility Comments: independent w/ no AD, reports unsteadiness and that he should use a RW ADLs Comments: indep     Extremity/Trunk Assessment   Upper Extremity Assessment Upper Extremity Assessment: Defer to OT evaluation    Lower Extremity Assessment Lower Extremity Assessment: Overall WFL for tasks assessed    Cervical / Trunk Assessment Cervical / Trunk Assessment: Other exceptions (hx of multiple backs surgeries)  Communication   Communication Communication: No apparent difficulties  Cognition Arousal: Alert Behavior During Therapy: WFL for tasks assessed/performed Overall Cognitive Status: Impaired/Different from baseline Area of Impairment: Safety/judgement      Safety/Judgement: Decreased awareness of deficits     General Comments: pt with mild balance deficits but reports "walkers are for old people"        General Comments General comments (skin integrity, edema, etc.): VSS on RA     PT Assessment Patient needs continued PT services  PT Problem List Decreased activity tolerance;Decreased balance;Decreased safety awareness       PT Treatment Interventions DME instruction;Gait training;Stair training;Functional mobility training;Therapeutic activities;Therapeutic exercise;Balance training;Patient/family education    PT Goals (Current goals can be found in the Care Plan section)  Acute Rehab PT Goals Patient Stated Goal: to go home PT Goal Formulation: With patient Time For Goal Achievement: 10/25/23 Potential to Achieve Goals: Good Additional Goals Additional Goal #1: Pt will improve DGI score from 17/24 to 20/24 to decrease risk of falling    Frequency Min 1X/week        AM-PAC PT "6 Clicks" Mobility  Outcome Measure Help needed turning from your back to your side while in a flat bed without using bedrails?: A  Little Help needed moving from lying on your back to sitting on the side of a flat bed without using bedrails?: A Little Help needed moving to and from a bed to a chair (including a wheelchair)?: A Little Help needed standing up from a chair using your arms (e.g., wheelchair or bedside chair)?: A Little Help needed to walk in hospital room?: A Little Help needed climbing 3-5 steps with a railing? : A Lot 6 Click Score: 17    End of Session Equipment Utilized During Treatment: Gait belt Activity Tolerance: Patient tolerated treatment well Patient left: in bed;with call bell/phone within reach;with bed alarm set;with family/visitor present Nurse Communication: Mobility status PT Visit Diagnosis: Unsteadiness on feet (R26.81)    Time: 1610-9604 PT Time Calculation (min) (ACUTE ONLY): 16 min   Charges:   PT Evaluation $PT Eval Low Complexity: 1 Low   PT General Charges $$ ACUTE PT VISIT: 1 Visit         Hilton Cork, PT, DPT Secure Chat Preferred  Rehab Office [phone]  Arturo Morton Brion Aliment 10/11/2023, 1:42 PM

## 2023-10-11 NOTE — Progress Notes (Signed)
PROGRESS NOTE  Bryan [name]  IHK:[id number] DOB: 1946-02-24 DOA: 10/10/2023 PCP: Raymon Mutton., FNP   Brief Narrative:  Patient is a 77 year old male with history of rheumatoid arthritis, Hodgkin's lymphoma on Rituxan, paroxysmal A-fib on anticoagulation, BPH, OSA on CPAP, hypertension, history of PE who presented with sudden onset of right lower quadrant abdominal pain.  He has history of diverticulitis.  No nausea, vomiting, fever or chills.  On presentation, he was found to have tenderness in the right lower quadrant.  CT abdomen/pelvis showed acute right-sided  diverticulitis.  Started on antibiotics .  Abdominal pain better today, started on for liquid diet  Assessment & Plan:  Principal Problem:   Acute diverticulitis   Recurrent acute diverticulitis :Presented with right lower quadrant abdominal pain.  Found to have diverticulitis as per CT imaging.  Started on broad-spectrum antibiotics.  Currently afebrile.  Follow-up cultures.  This morning he looks comfortable.  Denies any significant abdominal pain and wants to advance her diet.  No nausea or vomiting. Started on full liquid diet. He needs to follow-up with GI as an outpatient for colonoscopy in 6 to 8 weeks  CKD stage IIIb: Currently kidney function stable and at baseline  BPH: Tamsulosin  History of diastolic CHF: Currently euvolemic.  Last echo showed EF of 55 to 60%, severely elevated pulmonary artery systolic pressure, tricuspid valve regurgitation  OSA: On CPAP  Paroxysmal A-fib/PE: On edoxaban .  Monitor on telemetry  Hypertension: Continue amlodipine.  Obesity: BMI of 36  Hypertension: Continue amlodipine.  Monitor blood pressure  Physical debility: PT/OT consulted  Incidental finding on CT: Imaging showed potential enhancing nodule along the prostate.  We recommend outpatient follow-up with urology.  Will provide referral on discharge          DVT prophylaxis: edoxaban (SAVAYSA) tablet 30 mg      Code Status: Full Code  Family Communication: Wife at bedside  Patient status:Inpatient  Patient is from :home  Anticipated discharge VF:[facility]  Estimated DC date:1-2 days   Consultants: None  Procedures:None  Antimicrobials:  Anti-infectives (From admission, onward)    Start     Dose/Rate Route Frequency Ordered Stop   10/11/23 1000  cefTRIAXone (ROCEPHIN) 2 g in sodium chloride 0.9 % 100 mL IVPB       Placed in "And" Linked Group   2 g 200 mL/hr over 30 Minutes Intravenous Every 24 hours 10/10/23 1917     10/10/23 2200  metroNIDAZOLE (FLAGYL) IVPB 500 mg       Placed in "And" Linked Group   500 mg 100 mL/hr over 60 Minutes Intravenous Every 12 hours 10/10/23 1917     10/10/23 1545  cefTRIAXone (ROCEPHIN) 2 g in sodium chloride 0.9 % 100 mL IVPB       Placed in "And" Linked Group   2 g 200 mL/hr over 30 Minutes Intravenous  Once 10/10/23 1538 10/10/23 1744   10/10/23 1545  metroNIDAZOLE (FLAGYL) IVPB 500 mg       Placed in "And" Linked Group   500 mg 100 mL/hr over 60 Minutes Intravenous  Once 10/10/23 1538 10/10/23 1745   10/10/23 0000  ciprofloxacin (CIPRO) 500 MG tablet        500 mg Oral Every 12 hours 10/10/23 1455 10/15/23 2359   10/10/23 0000  metroNIDAZOLE (FLAGYL) 500 MG tablet        500 mg Oral 2 times daily 10/10/23 1455 10/15/23 2359       Subjective: Patient seen and  examined at bedside today.  Hemodynamically stable.  Comfortable lying in bed.  No significant abdominal pain on the right side today.  No nausea or vomiting.  Wife at bedside.  Agreed to advance the diet to full liquid  Objective: Vitals:   10/10/23 2000 10/10/23 2100 10/11/23 0409 10/11/23 0737  BP: (!) 151/74 (!) 157/73 (!) 110/59 (!) 127/57  Pulse: 80 80 89 (!) 51  Resp: (!) 22 20 19 18   Temp:  99.5 F (37.5 C) 98.5 F (36.9 C) 98.4 F (36.9 C)  TempSrc:  Oral Oral Oral  SpO2: 93% 95% 94% 93%  Weight:      Height:        Intake/Output Summary (Last 24 hours) at  10/11/2023 0757 Last data filed at 10/11/2023 0500 Gross per 24 hour  Intake --  Output 450 ml  Net -450 ml   Filed Weights   10/10/23 1054  Weight: 99.8 kg    Examination:  General exam: Overall comfortable, not in distress, pleasantly gentleman, obese HEENT: PERRL Respiratory system:  no wheezes or crackles  Cardiovascular system: S1 & S2 heard, RRR.  Gastrointestinal system: Abdomen is nondistended, soft and bowel sounds are present.  Tenderness on the right lower quadrant Central nervous system: Alert and oriented Extremities: No edema, no clubbing ,no cyanosis Skin: No rashes, no ulcers,no icterus     Data Reviewed: I have personally reviewed following labs and imaging studies  CBC: Recent Labs  Lab 10/10/23 1110 10/11/23 0453  WBC 7.4 7.6  HGB 11.8* 10.8*  HCT 37.5* 34.1*  MCV 98.4 96.3  PLT 206 191   Basic Metabolic Panel: Recent Labs  Lab 10/10/23 1110 10/11/23 0453  NA 137 136  K 4.6 4.4  CL 106 105  CO2 25 25  GLUCOSE 113* 94  BUN 22 20  CREATININE 1.35* 1.38*  CALCIUM 8.6* 8.2*  MG  --  1.8  PHOS  --  3.0     No results found for this or any previous visit (from the past 240 hour(s)).   Radiology Studies: CT ABDOMEN PELVIS W CONTRAST  Result Date: 10/10/2023 CLINICAL DATA:  Right lower quadrant pain. EXAM: CT ABDOMEN AND PELVIS WITH CONTRAST TECHNIQUE: Multidetector CT imaging of the abdomen and pelvis was performed using the standard protocol following bolus administration of intravenous contrast. RADIATION DOSE REDUCTION: This exam was performed according to the departmental dose-optimization program which includes automated exposure control, adjustment of the mA and/or kV according to patient size and/or use of iterative reconstruction technique. CONTRAST:  75mL OMNIPAQUE IOHEXOL 350 MG/ML SOLN COMPARISON:  CT 04/24/2023. FINDINGS: Lower chest: There is some linear opacity lung bases likely scar or atelectasis. Slightly patulous lower  esophagus. Hepatobiliary: No focal liver abnormality is seen. Status post cholecystectomy. No biliary dilatation. Pancreas: Unremarkable. No pancreatic ductal dilatation or surrounding inflammatory changes. Spleen: Normal in size without focal abnormality. Adrenals/Urinary Tract: Adrenal glands are preserved. No enhancing renal mass or collecting system dilatation. There is tiny low-attenuation lesion along the lower pole left kidney, likely a benign Bosniak 2 lesion. No specific imaging follow-up. Preserved contours of the urinary bladder. Enlarged prostate. There is area of high density along the left side of the prostate anteriorly on series 3, image 71. Please correlate with the patient's PSA. Stomach/Bowel: Large bowel on this non oral contrast examination has scattered stool. Redundant course of the sigmoid colon. Scattered colonic diverticula. Along the ascending colon there is areas of wall thickening with stranding and diverticula extending medial. This  has separate from the normal caliber appendix and would be consistent with a right-sided diverticulitis. No complicating features of obstruction, free air or free fluid. Vascular/Lymphatic: Aortic atherosclerosis. No enlarged abdominal or pelvic lymph nodes. Reproductive: Heterogeneous prostate with potential enhancing nodule along the left side. Please correlate with patient's PSA Other: No free intra-abdominal air or free fluid. Small umbilical fat containing hernia. Musculoskeletal: Extensive surgical changes along the lumbar spine with laminectomy and hardware. Soft tissue thickening and edema again seen. Appearance is similar to the prior CT. Degenerative changes of the sacroiliac joints. Hardware along the SI joints is seen and has some lucency along the margins of the screws. Again unchanged. IMPRESSION: Again changes of right-sided colonic diverticulitis with wall thickening, stranding. No complicating features. Recommend follow up to confirm  clearance and exclude secondary pathology. Potentially enhancing nodule along the prostate. Please correlate with patient's PSA. Extensive surgical changes along the spine with hardware and laminectomy. Again lucency along the sacral volts is seen as well as the screws at L3. Please correlate for any clinical evidence of infection or loosening. Appearance is similar to previous. Electronically Signed   By: Karen Kays M.D.   On: 10/10/2023 14:37    Scheduled Meds:  amLODipine  5 mg Oral Daily   edoxaban  30 mg Oral Q24H   multivitamin with minerals  1 tablet Oral Daily   primidone  100 mg Oral BID   tamsulosin  0.8 mg Oral QHS   Continuous Infusions:  sodium chloride 50 mL/hr at 10/10/23 2143   cefTRIAXone (ROCEPHIN)  IV     And   metronidazole 500 mg (10/10/23 2146)     LOS: 1 day   Burnadette Pop, MD [facility] P11/29/2024, 7:57 AM

## 2023-10-12 DIAGNOSIS — K5792 Diverticulitis of intestine, part unspecified, without perforation or abscess without bleeding: Secondary | ICD-10-CM | POA: Diagnosis not present

## 2023-10-12 LAB — BASIC METABOLIC PANEL
Anion gap: 5 (ref 5–15)
BUN: 19 mg/dL (ref 8–23)
CO2: 24 mmol/L (ref 22–32)
Calcium: 8.3 mg/dL — ABNORMAL LOW (ref 8.9–10.3)
Chloride: 108 mmol/L (ref 98–111)
Creatinine, Ser: 1.35 mg/dL — ABNORMAL HIGH (ref 0.61–1.24)
GFR, Estimated: 54 mL/min — ABNORMAL LOW (ref >60)
Glucose, Bld: 84 mg/dL (ref 70–99)
Potassium: 4.3 mmol/L (ref 3.5–5.1)
Sodium: 137 mmol/L (ref 135–145)

## 2023-10-12 NOTE — Progress Notes (Signed)
PROGRESS NOTE  Bryan [name]  AOZ:[id number] DOB: 31-Jan-1946 DOA: 10/10/2023 PCP: Raymon Mutton., FNP   Brief Narrative:  Patient is a 77 year old male with history of rheumatoid arthritis, Hodgkin's lymphoma on Rituxan, paroxysmal A-fib on anticoagulation, BPH, OSA on CPAP, hypertension, history of PE who presented with sudden onset of right lower quadrant abdominal pain.  He has history of diverticulitis.  No nausea, vomiting, fever or chills.  On presentation, he was found to have tenderness in the right lower quadrant.  CT abdomen/pelvis showed acute right-sided  diverticulitis.  Started on antibiotics .  Currently on full liquid diet, waiting to advance  Assessment & Plan:  Principal Problem:   Acute diverticulitis   Recurrent acute diverticulitis :Presented with right lower quadrant abdominal pain.  Found to have diverticulitis as per CT imaging.  Started on broad-spectrum antibiotics.  Currently afebrile.  Follow-up cultures.  This morning he looks comfortable.  Denies any significant abdominal pain but wants to remain on full liquid diet for now.  He will let us know if he can advance his diet to soft.  He needs to follow-up with GI as an outpatient for colonoscopy in 6 to 8 weeks  CKD stage IIIb: Currently kidney function stable and at baseline  BPH: Tamsulosin  History of diastolic CHF: Currently euvolemic.  Last echo showed EF of 55 to 60%, severely elevated pulmonary artery systolic pressure, tricuspid valve regurgitation  OSA: On CPAP  Paroxysmal A-fib/PE: On edoxaban .  Monitor on telemetry  Hypertension: Continue amlodipine.  Obesity: BMI of 36  Hypertension: Continue amlodipine.  Monitor blood pressure  Physical debility: PT/OT consulted.  Recommended outpatient follow-up  Incidental finding on CT: Imaging showed potential enhancing nodule along the prostate.  We recommend outpatient follow-up with urology.  Will provide referral on discharge           DVT prophylaxis: edoxaban (SAVAYSA) tablet 30 mg     Code Status: Full Code  Family Communication: Wife at bedside on 11/29  Patient status:Inpatient  Patient is from :home  Anticipated discharge NG:EXBM  Estimated DC date:1-2 days.  Waiting to advance the diet   Consultants: None  Procedures:None  Antimicrobials:  Anti-infectives (From admission, onward)    Start     Dose/Rate Route Frequency Ordered Stop   10/11/23 1000  cefTRIAXone (ROCEPHIN) 2 g in sodium chloride 0.9 % 100 mL IVPB       Placed in "And" Linked Group   2 g 200 mL/hr over 30 Minutes Intravenous Every 24 hours 10/10/23 1917     10/10/23 2200  metroNIDAZOLE (FLAGYL) IVPB 500 mg       Placed in "And" Linked Group   500 mg 100 mL/hr over 60 Minutes Intravenous Every 12 hours 10/10/23 1917     10/10/23 1545  cefTRIAXone (ROCEPHIN) 2 g in sodium chloride 0.9 % 100 mL IVPB       Placed in "And" Linked Group   2 g 200 mL/hr over 30 Minutes Intravenous  Once 10/10/23 1538 10/10/23 1744   10/10/23 1545  metroNIDAZOLE (FLAGYL) IVPB 500 mg       Placed in "And" Linked Group   500 mg 100 mL/hr over 60 Minutes Intravenous  Once 10/10/23 1538 10/10/23 1745   10/10/23 0000  ciprofloxacin (CIPRO) 500 MG tablet        500 mg Oral Every 12 hours 10/10/23 1455 10/15/23 2359   10/10/23 0000  metroNIDAZOLE (FLAGYL) 500 MG tablet  500 mg Oral 2 times daily 10/10/23 1455 10/15/23 2359       Subjective: Patient seen and examined at bedside today.  Hemodynamically stable.  Lying in bed.  Any significant abdominal pain.  No worsening of abdominal pain, no nausea or vomiting.  He had 2 bowel movement this morning.  Wants to try full liquid diet for today and he will let us know if he wants to advance the diet to soft later today   Objective: Vitals:   10/11/23 2010 10/11/23 2049 10/12/23 0422 10/12/23 0752  BP: (!) 157/78  133/65 126/62  Pulse: 86 90 85 79  Resp: 19 16 18 16   Temp: 99.3 F (37.4 C)  99.2 F  (37.3 C) 98.6 F (37 C)  TempSrc: Oral  Oral Oral  SpO2: 100% 100% 97% 99%  Weight:      Height:        Intake/Output Summary (Last 24 hours) at 10/12/2023 1126 Last data filed at 10/12/2023 0543 Gross per 24 hour  Intake 581.19 ml  Output 450 ml  Net 131.19 ml   Filed Weights   10/10/23 1054  Weight: 99.8 kg    Examination:   General exam: Overall comfortable, not in distress,obese HEENT: PERRL Respiratory system:  no wheezes or crackles  Cardiovascular system: S1 & S2 heard, RRR.  Gastrointestinal system: Abdomen is nondistended, soft and nontender.  Mild tenderness in the right lower quadrant Central nervous system: Alert and oriented Extremities: No edema, no clubbing ,no cyanosis Skin: No rashes, no ulcers,no icterus     Data Reviewed: I have personally reviewed following labs and imaging studies  CBC: Recent Labs  Lab 10/10/23 1110 10/11/23 0453  WBC 7.4 7.6  HGB 11.8* 10.8*  HCT 37.5* 34.1*  MCV 98.4 96.3  PLT 206 191   Basic Metabolic Panel: Recent Labs  Lab 10/10/23 1110 10/11/23 0453 10/12/23 0259  NA 137 136 137  K 4.6 4.4 4.3  CL 106 105 108  CO2 25 25 24   GLUCOSE 113* 94 84  BUN 22 20 19   CREATININE 1.35* 1.38* 1.35*  CALCIUM 8.6* 8.2* 8.3*  MG  --  1.8  --   PHOS  --  3.0  --      Recent Results (from the past 240 hour(s))  Culture, blood (Routine X 2) w Reflex to ID Panel     Status: None (Preliminary result)   Collection Time: 10/11/23  9:24 AM   Specimen: BLOOD  Result Value Ref Range Status   Specimen Description BLOOD BLOOD LEFT HAND  Final   Special Requests   Final    BOTTLES DRAWN AEROBIC AND ANAEROBIC Blood Culture adequate volume   Culture   Final    NO GROWTH < 24 HOURS Performed at [facility], [address], [place], [place] [address]    Report Status PENDING  Incomplete  Culture, blood (Routine X 2) w Reflex to ID Panel     Status: None (Preliminary result)   Collection Time: 10/11/23  9:25 AM    Specimen: BLOOD  Result Value Ref Range Status   Specimen Description BLOOD BLOOD LEFT HAND  Final   Special Requests   Final    BOTTLES DRAWN AEROBIC AND ANAEROBIC Blood Culture results may not be optimal due to an inadequate volume of blood received in culture bottles   Culture   Final    NO GROWTH < 24 HOURS Performed at [facility], [address] [place],  [place] [address]    Report Status PENDING  Incomplete     Radiology Studies: CT ABDOMEN PELVIS W CONTRAST  Result Date: 10/10/2023 CLINICAL DATA:  Right lower quadrant pain. EXAM: CT ABDOMEN AND PELVIS WITH CONTRAST TECHNIQUE: Multidetector CT imaging of the abdomen and pelvis was performed using the standard protocol following bolus administration of intravenous contrast. RADIATION DOSE REDUCTION: This exam was performed according to the departmental dose-optimization program which includes automated exposure control, adjustment of the mA and/or kV according to patient size and/or use of iterative reconstruction technique. CONTRAST:  75mL OMNIPAQUE IOHEXOL 350 MG/ML SOLN COMPARISON:  CT 04/24/2023. FINDINGS: Lower chest: There is some linear opacity lung bases likely scar or atelectasis. Slightly patulous lower esophagus. Hepatobiliary: No focal liver abnormality is seen. Status post cholecystectomy. No biliary dilatation. Pancreas: Unremarkable. No pancreatic ductal dilatation or surrounding inflammatory changes. Spleen: Normal in size without focal abnormality. Adrenals/Urinary Tract: Adrenal glands are preserved. No enhancing renal mass or collecting system dilatation. There is tiny low-attenuation lesion along the lower pole left kidney, likely a benign Bosniak 2 lesion. No specific imaging follow-up. Preserved contours of the urinary bladder. Enlarged prostate. There is area of high density along the left side of the prostate anteriorly on series 3, image 71. Please correlate with the patient's PSA. Stomach/Bowel: Large bowel on  this non oral contrast examination has scattered stool. Redundant course of the sigmoid colon. Scattered colonic diverticula. Along the ascending colon there is areas of wall thickening with stranding and diverticula extending medial. This has separate from the normal caliber appendix and would be consistent with a right-sided diverticulitis. No complicating features of obstruction, free air or free fluid. Vascular/Lymphatic: Aortic atherosclerosis. No enlarged abdominal or pelvic lymph nodes. Reproductive: Heterogeneous prostate with potential enhancing nodule along the left side. Please correlate with patient's PSA Other: No free intra-abdominal air or free fluid. Small umbilical fat containing hernia. Musculoskeletal: Extensive surgical changes along the lumbar spine with laminectomy and hardware. Soft tissue thickening and edema again seen. Appearance is similar to the prior CT. Degenerative changes of the sacroiliac joints. Hardware along the SI joints is seen and has some lucency along the margins of the screws. Again unchanged. IMPRESSION: Again changes of right-sided colonic diverticulitis with wall thickening, stranding. No complicating features. Recommend follow up to confirm clearance and exclude secondary pathology. Potentially enhancing nodule along the prostate. Please correlate with patient's PSA. Extensive surgical changes along the spine with hardware and laminectomy. Again lucency along the sacral volts is seen as well as the screws at L3. Please correlate for any clinical evidence of infection or loosening. Appearance is similar to previous. Electronically Signed   By: Karen Kays M.D.   On: 10/10/2023 14:37    Scheduled Meds:  amLODipine  5 mg Oral Daily   edoxaban  30 mg Oral Q24H   multivitamin with minerals  1 tablet Oral Daily   primidone  100 mg Oral BID   tamsulosin  0.8 mg Oral QHS   Continuous Infusions:  cefTRIAXone (ROCEPHIN)  IV 2 g (10/12/23 6045)   And   metronidazole  500 mg (10/12/23 0814)     LOS: 2 days   Burnadette Pop, MD [facility] P11/30/2024, 11:26 AM

## 2023-10-12 NOTE — Progress Notes (Signed)
Patient declined CPAP tonight. Unit at bedside if he changes his mind.

## 2023-10-12 NOTE — Plan of Care (Signed)

## 2023-10-13 DIAGNOSIS — K5792 Diverticulitis of intestine, part unspecified, without perforation or abscess without bleeding: Secondary | ICD-10-CM | POA: Diagnosis not present

## 2023-10-13 MED ORDER — ONDANSETRON HCL 4 MG PO TABS: 4.0000 mg | ORAL_TABLET | Freq: Three times a day (TID) | ORAL | 0 refills | Status: DC | PRN

## 2023-10-13 MED ORDER — AMOXICILLIN-POT CLAVULANATE 875-125 MG PO TABS: 1.0000 | ORAL_TABLET | Freq: Two times a day (BID) | ORAL | 0 refills | Status: AC

## 2023-10-13 MED ORDER — AMOXICILLIN-POT CLAVULANATE 875-125 MG PO TABS: 1.0000 | ORAL_TABLET | Freq: Two times a day (BID) | ORAL | Status: DC

## 2023-10-13 NOTE — Plan of Care (Signed)
  Problem: Education: Goal: Knowledge of General Education information will improve Description: Including pain rating scale, medication(s)/side effects and non-pharmacologic comfort measures 10/13/2023 1108 by Darden Amber, RN Outcome: Adequate for Discharge 10/13/2023 1610 by Darden Amber, RN Outcome: Progressing   Problem: Health Behavior/Discharge Planning: Goal: Ability to manage health-related needs will improve 10/13/2023 1108 by Darden Amber, RN Outcome: Adequate for Discharge 10/13/2023 9604 by Darden Amber, RN Outcome: Progressing   Problem: Clinical Measurements: Goal: Ability to maintain clinical measurements within normal limits will improve 10/13/2023 1108 by Darden Amber, RN Outcome: Adequate for Discharge 10/13/2023 5409 by Darden Amber, RN Outcome: Progressing Goal: Will remain free from infection 10/13/2023 1108 by Darden Amber, RN Outcome: Adequate for Discharge 10/13/2023 8119 by Darden Amber, RN Outcome: Progressing Goal: Diagnostic test results will improve 10/13/2023 1108 by Darden Amber, RN Outcome: Adequate for Discharge 10/13/2023 1478 by Darden Amber, RN Outcome: Progressing Goal: Respiratory complications will improve 10/13/2023 1108 by Darden Amber, RN Outcome: Adequate for Discharge 10/13/2023 2956 by Darden Amber, RN Outcome: Progressing Goal: Cardiovascular complication will be avoided 10/13/2023 1108 by Darden Amber, RN Outcome: Adequate for Discharge 10/13/2023 2130 by Darden Amber, RN Outcome: Progressing   Problem: Activity: Goal: Risk for activity intolerance will decrease 10/13/2023 1108 by Darden Amber, RN Outcome: Adequate for Discharge 10/13/2023 8657 by Darden Amber, RN Outcome: Progressing   Problem: Nutrition: Goal: Adequate nutrition will be maintained 10/13/2023 1108 by Darden Amber, RN Outcome: Adequate for Discharge 10/13/2023 8469 by  Darden Amber, RN Outcome: Progressing   Problem: Coping: Goal: Level of anxiety will decrease 10/13/2023 1108 by Darden Amber, RN Outcome: Adequate for Discharge 10/13/2023 6295 by Darden Amber, RN Outcome: Progressing   Problem: Elimination: Goal: Will not experience complications related to bowel motility 10/13/2023 1108 by Darden Amber, RN Outcome: Adequate for Discharge 10/13/2023 2841 by Darden Amber, RN Outcome: Progressing Goal: Will not experience complications related to urinary retention 10/13/2023 1108 by Darden Amber, RN Outcome: Adequate for Discharge 10/13/2023 3244 by Darden Amber, RN Outcome: Progressing   Problem: Pain Management: Goal: General experience of comfort will improve 10/13/2023 1108 by Darden Amber, RN Outcome: Adequate for Discharge 10/13/2023 0102 by Darden Amber, RN Outcome: Progressing   Problem: Safety: Goal: Ability to remain free from injury will improve 10/13/2023 1108 by Darden Amber, RN Outcome: Adequate for Discharge 10/13/2023 7253 by Darden Amber, RN Outcome: Progressing   Problem: Skin Integrity: Goal: Risk for impaired skin integrity will decrease 10/13/2023 1108 by Darden Amber, RN Outcome: Adequate for Discharge 10/13/2023 6644 by Darden Amber, RN Outcome: Progressing

## 2023-10-13 NOTE — TOC Transition Note (Signed)
Transition of Care [facility]) - CM/SW Discharge Note   Patient Details  Name: Bryan [name] MRN: [id number] Date of Birth: 1946/06/11  Transition of Care [facility]) CM/SW Contact:  Ronny Bacon, RN Phone Number: 10/13/2023, 10:47 AM   Clinical Narrative:  Patient is being discharged today. Outpatient PT/OT recommendations noted, referral # [id number] placed for [facility] and contact information placed on AVS.     Final next level of care: Home/Self Care Barriers to Discharge: No Barriers Identified   Patient Goals and CMS Choice      Discharge Placement                         Discharge Plan and Services Additional resources added to the After Visit Summary for                                       Social Determinants of Health (SDOH) Interventions SDOH Screenings   Food Insecurity: No Food Insecurity (10/10/2023)  Housing: Low Risk  (10/10/2023)  Transportation Needs: No Transportation Needs (10/10/2023)  Utilities: Not At Risk (10/10/2023)  Alcohol Screen: Low Risk  (11/26/2019)  Depression (PHQ2-9): Low Risk  (02/22/2020)  Financial Resource Strain: Low Risk  (08/02/2022)  Social Connections: Unknown (03/23/2022)   Received from [facility]  Stress: No Stress Concern Present (12/07/2021)   Received from [facility]  Tobacco Use: Medium Risk (10/10/2023)     Readmission Risk Interventions    10/20/2021   12:20 PM  Readmission Risk Prevention Plan  Transportation Screening Complete  PCP or Specialist Appt within 5-7 Days Complete  Home Care Screening Complete  Medication Review (RN CM) Complete

## 2023-10-13 NOTE — Plan of Care (Signed)

## 2023-10-13 NOTE — Discharge Summary (Signed)
Physician Discharge Summary  Bryan [name] ION:[id number] DOB: 10/07/1946 DOA: 10/10/2023  PCP: Raymon Mutton., FNP  Admit date: 10/10/2023 Discharge date: 10/13/2023  Admitted From: Home Disposition:  Home  Discharge Condition:Stable CODE STATUS:FULL Diet recommendation: Heart Healthy   Brief/Interim Summary: Patient is a 77 year old male with history of rheumatoid arthritis, Hodgkin's lymphoma on Rituxan, paroxysmal A-fib on anticoagulation, BPH, OSA on CPAP, hypertension, history of PE who presented with sudden onset of right lower quadrant abdominal pain.  He has history of diverticulitis.  No nausea, vomiting, fever or chills.  On presentation, he was found to have tenderness in the right lower quadrant.  CT abdomen/pelvis showed acute right-sided  diverticulitis.  Started on antibiotics .  Diet gradually advanced and he is tolerating solid diet now.  Medically stable for discharge home today with oral antibiotics.  Following problems were addressed during the hospitalization:  Recurrent acute diverticulitis :Presented with right lower quadrant abdominal pain.  Found to have diverticulitis as per CT imaging.  Started on broad-spectrum antibiotics.  Cultures NGTD. This morning he looks comfortable.  Denies any significant abdominal pain .  He has been tolerating soft diet since yesterday. He needs to follow-up with GI as an outpatient for colonoscopy in 6 to 8 weeks   CKD stage IIIb: Currently kidney function stable and at baseline   BPH: On Tamsulosin   History of diastolic CHF: Currently euvolemic.  Last echo showed EF of 55 to 60%, severely elevated pulmonary artery systolic pressure, tricuspid valve regurgitation   OSA: On CPAP   Paroxysmal A-fib/PE: On edoxaban,metoprolol .     Hypertension: Continue amlodipine,metoprolol.   Obesity: BMI of 36   Physical debility: PT/OT consulted.  Recommended outpatient follow-up   Incidental finding on CT: Imaging showed potential  enhancing nodule along the prostate.  We recommend outpatient follow-up with urology, he has an appointment coming up.    Discharge Diagnoses:  Principal Problem:   Acute diverticulitis    Discharge Instructions  Discharge Instructions     Diet - low sodium heart healthy   Complete by: As directed    Discharge instructions   Complete by: As directed    1)Please take prescribed medications as instructed 2)Follow up with your PCP in a week.  Make an appointment with gastroenterology in 6 to 8 weeks for screening colonoscopy.   Increase activity slowly   Complete by: As directed       Allergies as of 10/13/2023       Reactions   Terazosin Other (See Comments)   Severe hypotension   Adalimumab Other (See Comments)   (Humira) Lymphoma   Gabapentin Other (See Comments)   confusion, altered mental status   Statins Other (See Comments)   Memory issues   Ampicillin Nausea Only   Tolerated ancef on 07-30-22 without issue intraoperativelty   Lisinopril Cough   Other Other (See Comments)   BLOOD PRODUCT REFUSAL (patient is a Jehovah's Witness)        Medication List     TAKE these medications    acetaminophen 325 MG tablet Commonly known as: TYLENOL Take 1-2 tablets (325-650 mg total) by mouth every 4 (four) hours as needed for mild pain.   amLODipine 5 MG tablet Commonly known as: NORVASC Take 5 mg by mouth daily.   amoxicillin-clavulanate 875-125 MG tablet Commonly known as: AUGMENTIN Take 1 tablet by mouth every 12 (twelve) hours for 10 days.   edoxaban 30 MG Tabs tablet Commonly known as: SAVAYSA Take 30  mg by mouth daily.   HYDROcodone-acetaminophen 5-325 MG tablet Commonly known as: NORCO/VICODIN Take 1 tablet by mouth every 4 (four) hours as needed.   metoprolol 200 MG 24 hr tablet Commonly known as: TOPROL-XL Take 100 mg by mouth daily.   multivitamin with minerals Tabs tablet Take 1 tablet by mouth daily.   nitroGLYCERIN 0.4 MG SL tablet Commonly  known as: NITROSTAT Place 0.4 mg under the tongue every 5 (five) minutes as needed for chest pain.   ondansetron 4 MG tablet Commonly known as: Zofran Take 1 tablet (4 mg total) by mouth every 8 (eight) hours as needed for nausea or vomiting.   polyethylene glycol 17 g packet Commonly known as: MIRALAX / GLYCOLAX Take 17 g by mouth daily as needed for mild constipation.   primidone 50 MG tablet Commonly known as: MYSOLINE Take 100 mg by mouth 2 (two) times daily.   riTUXimab 500 MG/50ML injection Commonly known as: RITUXAN Inject into the vein See admin instructions. Unknown dosage, unconfirmed frequency. See notes.   tamsulosin 0.4 MG Caps capsule Commonly known as: FLOMAX Take 0.8 mg by mouth at bedtime.        Follow-up Information     Raymon Mutton., FNP. Schedule an appointment as soon as possible for a visit in 1 week(s).   Specialty: Family Medicine Contact information: 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 EXAM: CT ABDOMEN AND PELVIS WITH CONTRAST TECHNIQUE: Multidetector CT imaging of the abdomen and pelvis was performed using the standard protocol following bolus administration of intravenous contrast. RADIATION DOSE REDUCTION: This exam was performed according to the departmental dose-optimization program which includes  automated exposure control, adjustment of the mA and/or kV according to patient size and/or use of iterative reconstruction technique. CONTRAST:  75mL OMNIPAQUE IOHEXOL 350 MG/ML SOLN COMPARISON:  CT 04/24/2023. FINDINGS: Lower chest: There is some linear opacity lung bases likely scar or atelectasis. Slightly patulous lower esophagus. Hepatobiliary: No focal liver abnormality is seen. Status post cholecystectomy. No biliary dilatation. Pancreas: Unremarkable. No pancreatic ductal dilatation or surrounding inflammatory changes. Spleen: Normal in size without focal abnormality. Adrenals/Urinary Tract: Adrenal glands are preserved. No enhancing renal mass or collecting system dilatation. There is tiny low-attenuation lesion along the lower pole left kidney, likely a benign Bosniak 2 lesion. No specific imaging follow-up. Preserved contours of the urinary bladder. Enlarged prostate. There is area of high density along the left side of the prostate anteriorly on series 3, image 71. Please correlate with the patient's PSA. Stomach/Bowel: Large bowel on this non oral contrast examination has scattered stool. Redundant course of the sigmoid colon. Scattered colonic diverticula. Along the ascending colon there is areas of wall thickening with stranding and diverticula extending medial. This has separate from the normal  caliber appendix and would be consistent with a right-sided diverticulitis. No complicating features of obstruction, free air or free fluid. Vascular/Lymphatic: Aortic atherosclerosis. No enlarged abdominal or pelvic lymph nodes. Reproductive: Heterogeneous prostate with potential enhancing nodule along the left side. Please correlate with patient's PSA Other: No free intra-abdominal air or free fluid. Small umbilical fat containing hernia. Musculoskeletal: Extensive surgical changes along the lumbar spine with laminectomy and hardware. Soft tissue thickening and edema again seen. Appearance is similar to the  prior CT. Degenerative changes of the sacroiliac joints. Hardware along the SI joints is seen and has some lucency along the margins of the screws. Again unchanged. IMPRESSION: Again changes of right-sided colonic diverticulitis with wall thickening, stranding. No complicating features. Recommend follow up to confirm clearance and exclude secondary pathology. Potentially enhancing nodule along the prostate. Please correlate with patient's PSA. Extensive surgical changes along the spine with hardware and laminectomy. Again lucency along the sacral volts is seen as well as the screws at L3. Please correlate for any clinical evidence of infection or loosening. Appearance is similar to previous. Electronically Signed   By: Karen Kays M.D.   On: 10/10/2023 14:37      Subjective: Patient seen and examined at bedside today.  Hemodynamically stable comfortable.  Tolerating soft diet since yesterday.  Medically stable for discharge today with oral antibiotics.  I discussed with his wife about discharge planning.  Discharge Exam: Vitals:   10/13/23 0443 10/13/23 0738  BP: (!) 163/70 (!) 115/97  Pulse: 80 85  Resp: 18 15  Temp: 98.2 F (36.8 C) 98.6 F (37 C)  SpO2: 97% 99%   Vitals:   10/12/23 1549 10/12/23 2029 10/13/23 0443 10/13/23 0738  BP: (!) 175/80 (!) 156/85 (!) 163/70 (!) 115/97  Pulse: 74 79 80 85  Resp: 16 17 18 15   Temp: 99 F (37.2 C) 99.5 F (37.5 C) 98.2 F (36.8 C) 98.6 F (37 C)  TempSrc: Oral Oral Oral Oral  SpO2: 94% 94% 97% 99%  Weight:      Height:        General: Pt is alert, awake, not in acute distress Cardiovascular: RRR, S1/S2 +, no rubs, no gallops Respiratory: CTA bilaterally, no wheezing, no rhonchi Abdominal: Soft, NT, ND, bowel sounds + Extremities: no edema, no cyanosis    The results of significant diagnostics from this hospitalization (including imaging, microbiology, ancillary and laboratory) are listed below for reference.      Microbiology: Recent Results (from the past 240 hour(s))  Culture, blood (Routine X 2) w Reflex to ID Panel     Status: None (Preliminary result)   Collection Time: 10/11/23  9:24 AM   Specimen: BLOOD  Result Value Ref Range Status   Specimen Description BLOOD BLOOD LEFT HAND  Final   Special Requests   Final    BOTTLES DRAWN AEROBIC AND ANAEROBIC Blood Culture adequate volume   Culture   Final    NO GROWTH 2 DAYS Performed at [facility], [address] [place], [place] [address]    Report Status PENDING  Incomplete  Culture, blood (Routine X 2) w Reflex to ID Panel     Status: None (Preliminary result)   Collection Time: 10/11/23  9:25 AM   Specimen: BLOOD  Result Value Ref Range Status   Specimen Description BLOOD BLOOD LEFT HAND  Final   Special Requests   Final    BOTTLES DRAWN AEROBIC AND ANAEROBIC Blood Culture results may not be optimal due  to an inadequate volume of blood received in culture bottles   Culture   Final    NO GROWTH 2 DAYS Performed at [facility], [address], [place], [place] [address]    Report Status PENDING  Incomplete     Labs: BNP (last 3 results) No results for input(s): "BNP" in the last 8760 hours. Basic Metabolic Panel: Recent Labs  Lab 10/10/23 1110 10/11/23 0453 10/12/23 0259  NA 137 136 137  K 4.6 4.4 4.3  CL 106 105 108  CO2 25 25 24   GLUCOSE 113* 94 84  BUN 22 20 19   CREATININE 1.35* 1.38* 1.35*  CALCIUM 8.6* 8.2* 8.3*  MG  --  1.8  --   PHOS  --  3.0  --    Liver Function Tests: Recent Labs  Lab 10/10/23 1110 10/11/23 0453  AST 22 17  ALT 15 15  ALKPHOS 166* 127*  BILITOT 0.3 0.5  PROT 6.5 6.2*  ALBUMIN 3.3* 3.0*   Recent Labs  Lab 10/10/23 1110  LIPASE 28   No results for input(s): "AMMONIA" in the last 168 hours. CBC: Recent Labs  Lab 10/10/23 1110 10/11/23 0453  WBC 7.4 7.6  HGB 11.8* 10.8*  HCT 37.5* 34.1*  MCV 98.4 96.3  PLT 206 191   Cardiac Enzymes: No results for  input(s): "CKTOTAL", "CKMB", "CKMBINDEX", "TROPONINI" in the last 168 hours. BNP: Invalid input(s): "POCBNP" CBG: No results for input(s): "GLUCAP" in the last 168 hours. D-Dimer No results for input(s): "DDIMER" in the last 72 hours. Hgb A1c No results for input(s): "HGBA1C" in the last 72 hours. Lipid Profile No results for input(s): "CHOL", "HDL", "LDLCALC", "TRIG", "CHOLHDL", "LDLDIRECT" in the last 72 hours. Thyroid function studies No results for input(s): "TSH", "T4TOTAL", "T3FREE", "THYROIDAB" in the last 72 hours.  Invalid input(s): "FREET3" Anemia work up No results for input(s): "VITAMINB12", "FOLATE", "FERRITIN", "TIBC", "IRON", "RETICCTPCT" in the last 72 hours. Urinalysis    Component Value Date/Time   COLORURINE YELLOW 10/10/2023 1107   APPEARANCEUR CLEAR 10/10/2023 1107   LABSPEC 1.017 10/10/2023 1107   PHURINE 5.0 10/10/2023 1107   GLUCOSEU NEGATIVE 10/10/2023 1107   HGBUR SMALL (A) 10/10/2023 1107   BILIRUBINUR NEGATIVE 10/10/2023 1107   BILIRUBINUR negative 04/24/2023 1703   BILIRUBINUR neg 07/29/2013 0953   KETONESUR NEGATIVE 10/10/2023 1107   PROTEINUR 30 (A) 10/10/2023 1107   UROBILINOGEN 0.2 04/24/2023 1703   UROBILINOGEN 0.2 12/05/2022 1030   NITRITE NEGATIVE 10/10/2023 1107   LEUKOCYTESUR NEGATIVE 10/10/2023 1107   Sepsis Labs Recent Labs  Lab 10/10/23 1110 10/11/23 0453  WBC 7.4 7.6   Microbiology Recent Results (from the past 240 hour(s))  Culture, blood (Routine X 2) w Reflex to ID Panel     Status: None (Preliminary result)   Collection Time: 10/11/23  9:24 AM   Specimen: BLOOD  Result Value Ref Range Status   Specimen Description BLOOD BLOOD LEFT HAND  Final   Special Requests   Final    BOTTLES DRAWN AEROBIC AND ANAEROBIC Blood Culture adequate volume   Culture   Final    NO GROWTH 2 DAYS Performed at [facility], [address], [place], [place] [address]    Report Status PENDING  Incomplete  Culture, blood (Routine X 2)  w Reflex to ID Panel     Status: None (Preliminary result)   Collection Time: 10/11/23  9:25 AM   Specimen: BLOOD  Result Value Ref Range Status   Specimen  Description BLOOD BLOOD LEFT HAND  Final   Special Requests   Final    BOTTLES DRAWN AEROBIC AND ANAEROBIC Blood Culture results may not be optimal due to an inadequate volume of blood received in culture bottles   Culture   Final    NO GROWTH 2 DAYS Performed at [facility], [address] [place], [place] [address]    Report Status PENDING  Incomplete    Please note: You were cared for by a hospitalist during your hospital stay. Once you are discharged, your primary care physician will handle any further medical issues. Please note that NO REFILLS for any discharge medications will be authorized once you are discharged, as it is imperative that you return to your primary care physician (or establish a relationship with a primary care physician if you do not have one) for your post hospital discharge needs so that they can reassess your need for medications and monitor your lab values.    Time coordinating discharge: 40 minutes  SIGNED:   Burnadette Pop, MD  [facility] 10/13/2023, 10:00 AM Pager [phone]  If 7PM-7AM, please contact night-coverage [email or web address] Password [email or web address]

## 2023-10-16 LAB — CULTURE, BLOOD (ROUTINE X 2)
Culture: NO GROWTH
Culture: NO GROWTH
Special Requests: ADEQUATE

## 2023-10-29 ENCOUNTER — Telehealth: Payer: Self-pay | Admitting: Hematology

## 2023-10-29 NOTE — Telephone Encounter (Signed)
Spoke with patient confirming upcoming appointment  

## 2023-11-04 ENCOUNTER — Ambulatory Visit: Payer: Medicare PPO | Attending: Internal Medicine | Admitting: Physical Therapy

## 2023-11-04 DIAGNOSIS — R2681 Unsteadiness on feet: Secondary | ICD-10-CM | POA: Diagnosis present

## 2023-11-04 DIAGNOSIS — R2689 Other abnormalities of gait and mobility: Secondary | ICD-10-CM | POA: Diagnosis present

## 2023-11-04 NOTE — Therapy (Signed)
OUTPATIENT PHYSICAL THERAPY NEURO EVALUATION   Patient Name: Bryan [name] MRN: [id number] DOB:November 06, 1946, 77 y.o., male Today's Date: 11/04/2023   PCP: Raymon Mutton, FNP REFERRING PROVIDER: Burnadette Pop, MD  END OF SESSION:  PT End of Session - 11/04/23 1150     Visit Number 1    Number of Visits 7   with eval   Date for PT Re-Evaluation 12/30/23   to allow for scheduling delays   Authorization Type [employer]    PT Start Time 1150    PT Stop Time 1218   eval   PT Time Calculation (min) 28 min    Equipment Utilized During Treatment Gait belt    Activity Tolerance Patient tolerated treatment well    Behavior During Therapy WFL for tasks assessed/performed             Past Medical History:  Diagnosis Date   Anemia    Arthritis    hands, knees, cervical area. Back pain. Rheumatoid arthritis- weekly injections.   BPH (benign prostatic hypertrophy)    Cancer (HCC)    Diabetes (HCC)    Essential tremor    GAD (generalized anxiety disorder) 12/05/2021   GERD (gastroesophageal reflux disease)    reports for indigestion he uses mustard    Hearing deficit    wears hearing aids bilateral   HTN (hypertension)    Obesity    OSA (obstructive sleep apnea)    PAF (paroxysmal atrial fibrillation) (HCC)    Refusal of blood transfusions as patient is Jehovah's Witness    Rheumatoid arthritis (HCC)    Right bundle branch block    history of   Seizures (HCC)    AS A CHILD. only esential tremors now.   Sleep apnea    cpap - settings at 9 per patient    Ulcer    Past Surgical History:  Procedure Laterality Date   ABDOMINAL EXPOSURE N/A 07/30/2022   Procedure: ABDOMINAL EXPOSURE;  Surgeon: Cephus Shelling, MD;  Location: [facility] OR;  Service: Vascular;  Laterality: N/A;   ANTERIOR CERVICAL DECOMP/DISCECTOMY FUSION N/A 03/30/2013   Procedure: ANTERIOR CERVICAL DECOMPRESSION/DISCECTOMY FUSION 2 LEVELS;  Surgeon: Clydene Fake, MD;  Location: [facility] OR[facility];  Service:  Neurosurgery;  Laterality: N/A;  C4-5 C5-6 Anterior cervical decompression/diskectomy/fusion/Allograft/Plate   ANTERIOR LUMBAR FUSION N/A 07/30/2022   Procedure: Anterior Lumbar  Interbody Fusion  - Lumbar five-sacral one posterior augmentation with iliac screws and O- arm;  Surgeon: Donalee Citrin, MD;  Location[facility] OR;  Service: Neurosurgery;  Laterality: N/A;   CHOLECYSTECTOMY N/A 12/07/2013   Procedure: LAPAROSCOPIC CHOLECYSTECTOMY WITH INTRAOPERATIVE CHOLANGIOGRAM;  Surgeon: Ardeth Sportsman, MD;  Location: [facility] ORS;  Service: General;  Laterality: N/A;   COLONOSCOPY     DECOMPRESSIVE LUMBAR LAMINECTOMY LEVEL 2 N/A 02/15/2015   Procedure: COMPLETE DECOMPRESSIVE LUMBAR LAMINECTOMY L4-L5/ FORAMINOTOMY TO L4 NERVE ROOT AND L5 NERVE ROOT BILATERALLY;  Surgeon: Ranee Gosselin, MD;  Location: [facility] ORS;  Service: Orthopedics;  Laterality: N/A;   EYE SURGERY     right, growth excision   LAMINECTOMY WITH POSTERIOR LATERAL ARTHRODESIS LEVEL 1 N/A 07/30/2022   Procedure: LAMINECTOMY WITH POSTERIOR LATERAL ARTHRODESIS LUMBAR FIVE SACRAL ONE;  Surgeon: Donalee Citrin, MD;  Location: [facility] OR;  Service: Neurosurgery;  Laterality: N/A;   LUMBAR LAMINECTOMY/DECOMPRESSION MICRODISCECTOMY Left 01/18/2016   Procedure:  DECOMPRESSION L4-L5 MICRODISCECTOMY L4-L5 ON LEFT FOR SPINAL STENOSIS;  Surgeon: Ranee Gosselin, MD;  Location: [facility] ORS;  Service: Orthopedics;  Laterality: Left;   PAROTIDECTOMY  Left 01/24/2018   Procedure: INCISIONAL BIOPSY OF LEFT PAROTID    MASS;  Surgeon: Graylin Shiver, MD;  Location: [facility] OR;  Service: ENT;  Laterality: Left;   SPINE SURGERY     TONSILLECTOMY     VASECTOMY     WRIST GANGLION EXCISION Left    Patient Active Problem List   Diagnosis Date Noted   Acute diverticulitis 10/10/2023   Chronic kidney disease 08/11/2022   Lumbar disc disorder with myelopathy 08/03/2022   Spinal stenosis of lumbosacral region 07/30/2022   Pre-op evaluation 07/26/2022   Congestive heart failure (HCC) 06/27/2022    Erectile dysfunction 06/27/2022   Hearing loss, bilateral 06/27/2022   Lateral epicondylitis (tennis elbow) 06/27/2022   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, 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) 06/27/2022   Overweight 06/27/2022   Exposure to Agent [facility] 06/27/2022   Hand pain 06/27/2022   Pain in right knee 06/27/2022   Pseudoarthrosis of lumbar spine 06/27/2022   Sensorineural hearing loss, bilateral 06/27/2022   Tremor 06/27/2022   Unilateral primary osteoarthritis, right knee 06/27/2022   Benign essential hypertension 06/27/2022   Hypertensive disorder 06/27/2022   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 06/27/2022   Atrial fibrillation (HCC) 06/27/2022   Other persistent atrial fibrillation (HCC) 06/27/2022   Sleep apnea, unspecified 06/27/2022   Obstructive sleep apnea syndrome 06/27/2022   Obstructive sleep apnea (adult) (pediatric) 06/27/2022   Cervical radiculopathy 06/27/2022   Cervical spinal stenosis 06/27/2022   Radiculopathy, lumbar region 06/27/2022   Encounter for other administrative examinations 06/27/2022   Preoperative cardiovascular examination 06/27/2022   Degeneration of lumbar or lumbosacral intervertebral disc 06/27/2022   Pain in joint involving shoulder region 06/27/2022   Diarrhea, unspecified 06/27/2022   Encounter for fitting and adjustment of hearing aid 06/27/2022   Dyspnea on exertion 02/20/2022   Acute on chronic diastolic CHF (congestive heart failure) (HCC) 01/20/2022   History of pulmonary embolism 01/20/2022   Atrial fibrillation with RVR (HCC) 01/19/2022   OSA on CPAP 12/05/2021   GAD (generalized anxiety disorder) 12/05/2021   History of Hodgkin's lymphoma 12/05/2021   Hypoglycemia 12/05/2021   MALT (mucosa associated lymphoid tissue) 12/05/2021   Mixed hyperlipidemia 12/05/2021   Multiple pulmonary emboli (HCC) 12/05/2021   Normocytic anemia 12/05/2021   OA (osteoarthritis)  12/05/2021   Primary hypertension 12/05/2021   Chronic pain syndrome 12/05/2021   Paroxysmal atrial flutter (HCC) 12/05/2021   Severe sepsis (HCC) 10/09/2021   New onset atrial fibrillation (HCC) 10/09/2021   Acute blood loss anemia 10/09/2021   Spinal stenosis of lumbar region 10/02/2021   Abnormal stress test 04/27/2020   Coronary artery disease involving native coronary artery of native heart without angina pectoris 04/27/2020   Irregular heart beat 04/27/2020   Spondylolisthesis at L4-L5 level 12/25/2019   Lumbar post-laminectomy syndrome 08/13/2019   Lumbar spondylosis 08/13/2019   DDD (degenerative disc disease), lumbar 01/16/2019   Lumbar radiculopathy 09/25/2018   Precordial pain 07/30/2018   Elevated troponin    Pain 02/25/2018   Marginal zone lymphoma (HCC) 02/02/2018   Lymphoma (HCC) 01/24/2018   Parotid mass 10/28/2017   Arthralgia of right lower leg 10/10/2017   Chronic low back pain 04/16/2017   Class 2 severe obesity due to excess calories with serious comorbidity and body mass index (BMI) of 39.0 to 39.9 in adult [facility]) 03/07/2017   ACE-inhibitor cough 03/07/2017   Lumbago 06/21/2016   Spinal stenosis, lumbar region, with neurogenic claudication 02/15/2015   Obesity (  BMI 30-39.9) 12/07/2013   Refusal of blood transfusions as patient is Jehovah's Witness 12/07/2013   Cervical spondylosis without myelopathy 04/27/2013   Rheumatoid arthritis (HCC) 10/26/2012   Nonspecific abnormal finding in stool contents 07/28/2012   Multiple joint pain 06/05/2012   History of tobacco use-  06/05/2012   Asthmatic bronchitis 12/15/2011   BPH (benign prostatic hyperplasia) 12/15/2011   Essential tremor 12/15/2011   Essential hypertension 12/15/2011    ONSET DATE: 10/13/2023 (referral date)  REFERRING DIAG: R53.1 (ICD-10-CM) - Weakness  THERAPY DIAG:  Other abnormalities of gait and mobility  Unsteadiness on feet  Rationale for Evaluation and Treatment:  Rehabilitation  SUBJECTIVE:                                                                                                                                                                                             SUBJECTIVE STATEMENT: Pt states that he is not really sure why he is here. Pt reports he has had several back operations in the past. Pt reports that he does feel unsteady all the time from his back operations and from his age. Pt reports that despite feeling unsteady at all times he has had no falls. Pt reports that he used to use a cane but states, "I'm macho" and believes a cane makes him look and feel old. Pt also reports he is is need of a R knee replacement, putting it off.  Pt also states "My memory is like my hair", of note patient is bald.  Pt accompanied by: self, drives self  PERTINENT HISTORY: PMH includes Hodgkin lymphoma, paroxysmal A-fib on anticoagulation, RA, BPH, OSA on CPAP, hypertension, history of multiple pulmonary emboli, essential tremors on primidone, CKD, anemia, chronic HFpEF  PAIN:  Are you having pain? Yes, in low back and R knee (chronic, not rated)  PRECAUTIONS: Fall  RED FLAGS: None   WEIGHT BEARING RESTRICTIONS: No  FALLS: Has patient fallen in last 6 months? No  LIVING ENVIRONMENT: Lives with: lives with their spouse Lives in: House/apartment Stairs: No Has following equipment at home: Single point cane  PLOF: Independent with gait and Independent with transfers  PATIENT GOALS: improve balance  OBJECTIVE:  Note: Objective measures were completed at Evaluation unless otherwise noted.  DIAGNOSTIC FINDINGS:  None related to this POC  COGNITION: Overall cognitive status: No family/caregiver present to determine baseline cognitive functioning   SENSATION: No N/T in LE per patient report  LOWER EXTREMITY ROM:     Active  Right Eval Left Eval  Hip flexion    Hip extension    Hip abduction    Hip adduction  Hip internal  rotation    Hip external rotation    Knee flexion    Knee extension    Ankle dorsiflexion    Ankle plantarflexion    Ankle inversion    Ankle eversion     (Blank rows = not tested)  LOWER EXTREMITY MMT:    MMT Right Eval Left Eval  Hip flexion 5 5  Hip extension    Hip abduction    Hip adduction    Hip internal rotation    Hip external rotation    Knee flexion 5 5  Knee extension 5 5  Ankle dorsiflexion 5 5  Ankle plantarflexion    Ankle inversion    Ankle eversion    (Blank rows = not tested)  BED MOBILITY:  Mod I per patient report  TRANSFERS: Assistive device utilized: None  Sit to stand: Modified independence Stand to sit: Modified independence Chair to chair: Modified independence Floor:  not assessed at eval   STAIRS: Level of Assistance: CGA Stair Negotiation Technique: Alternating Pattern  with Bilateral Rails Number of Stairs: 6  Height of Stairs: 4  Comments: decreased speed/increased pain when descending due to R knee pain  GAIT: Distance walked: various clinic distances Assistive device utilized: None Level of assistance: Modified independence Comments: antalgic due to R knee pain, decreased gait speed  FUNCTIONAL TESTS:    [facility] PT Assessment - 11/04/23 1201       Ambulation/Gait   Gait velocity 32.8 ft over 12.31 sec = 2.66 ft/sec   no AD     Standardized Balance Assessment   Standardized Balance Assessment Timed Up and Go Test;Five Times Sit to Stand    Five times sit to stand comments  11.6 sec   hands on thighs     Timed Up and Go Test   TUG Normal TUG    Normal TUG (seconds) 10.28   no AD     Functional Gait  Assessment   Gait assessed  Yes    Gait Level Surface Walks 20 ft in less than 7 sec but greater than 5.5 sec, uses assistive device, slower speed, mild gait deviations, or deviates 6-10 in outside of the 12 in walkway width.    Change in Gait Speed Able to change speed, demonstrates mild gait deviations, deviates 6-10 in  outside of the 12 in walkway width, or no gait deviations, unable to achieve a major change in velocity, or uses a change in velocity, or uses an assistive device.    Gait with Horizontal Head Turns Performs head turns with moderate changes in gait velocity, slows down, deviates 10-15 in outside 12 in walkway width but recovers, can continue to walk.    Gait with Vertical Head Turns Performs task with moderate change in gait velocity, slows down, deviates 10-15 in outside 12 in walkway width but recovers, can continue to walk.    Gait and Pivot Turn Pivot turns safely in greater than 3 sec and stops with no loss of balance, or pivot turns safely within 3 sec and stops with mild imbalance, requires small steps to catch balance.    Step Over Obstacle Is able to step over one shoe box (4.5 in total height) without changing gait speed. No evidence of imbalance.    Gait with Narrow Base of Support Ambulates less than 4 steps heel to toe or cannot perform without assistance.    Gait with Eyes Closed Walks 20 ft, slow speed, abnormal gait pattern, evidence for imbalance, deviates  10-15 in outside 12 in walkway width. Requires more than 9 sec to ambulate 20 ft.    Ambulating Backwards Walks 20 ft, slow speed, abnormal gait pattern, evidence for imbalance, deviates 10-15 in outside 12 in walkway width.    Steps Alternating feet, must use rail.    Total Score 14    FGA comment: 14/30                                                                                                                                        TREATMENT: PT Evaluation    PATIENT EDUCATION: Education details: Eval findings, PT POC, purpose of PT Person educated: Patient Education method: Explanation Education comprehension: verbalized understanding and needs further education  HOME EXERCISE PROGRAM: To be initiated  GOALS: Goals reviewed with patient? Yes  SHORT TERM GOALS: Target date: 11/25/2023   Pt will be  independent with initial HEP for improved strength, balance, transfers and gait. Baseline: Goal status: INITIAL   LONG TERM GOALS: Target date: 12/19/2023   Pt will be independent with final HEP for improved strength, balance, transfers and gait. Baseline:  Goal status: INITIAL  2.  Pt will improve gait velocity to at least 2.75 ft/sec for improved gait efficiency and performance at mod I level  Baseline: 2.66 ft/sec no AD mod I (12/23) Goal status: INITIAL  3.  Pt will improve FGA to 19/30 for decreased fall risk  Baseline: 14/30 (1/23) Goal status: INITIAL   ASSESSMENT:  CLINICAL IMPRESSION: Patient is a 77 year old male referred to Neuro OPPT for weakness after recent hospitalization for diverticulitis.   Pt's PMH is significant for: Hodgkin lymphoma, paroxysmal A-fib on anticoagulation, RA, BPH, OSA on CPAP, hypertension, history of multiple pulmonary emboli, essential tremors on primidone, CKD, anemia, chronic HFpEF. The following deficits were present during the exam: impaired balance. Based on his FGA score of 14/30 and gait speed of 2.66 ft/sec, pt is an increased risk for falls. Pt would benefit from skilled PT to address these impairments and functional limitations to maximize functional mobility independence.   OBJECTIVE IMPAIRMENTS: Abnormal gait, decreased balance, decreased knowledge of use of DME, difficulty walking, and pain.   ACTIVITY LIMITATIONS: carrying, lifting, bending, squatting, and stairs  PARTICIPATION LIMITATIONS: community activity  PERSONAL FACTORS: Age, Fitness, and 3+ comorbidities:     Hodgkin lymphoma, paroxysmal A-fib on anticoagulation, RA, BPH, OSA on CPAP, hypertension, history of multiple pulmonary emboli, essential tremors on primidone, CKD, anemia, chronic HFpEFare also affecting patient's functional outcome.   REHAB POTENTIAL: Good  CLINICAL DECISION MAKING: Stable/uncomplicated  EVALUATION COMPLEXITY: Low  PLAN:  PT FREQUENCY:  1x/week  PT DURATION: 6 weeks  PLANNED INTERVENTIONS: [id number]- PT Re-evaluation, 97110-Therapeutic exercises, 97530- Therapeutic activity, [id number]- Neuromuscular re-education, 97535- Self Care, [id number]- Manual therapy, [id number]- Gait training, Patient/Family education, Balance training, Stair training, Dry Needling, Vestibular training, Visual/preceptual remediation/compensation, DME instructions, Cryotherapy, and Moist heat  PLAN FOR NEXT SESSION: initiate HEP for balance based on FGA impairments (head turns, obstacle navigation, backwards gait, EC, tandem) being mindful of low back and R knee chronic pain   Peter Congo, PT Peter Congo, PT, DPT, CSRS  11/04/2023, 12:32 PM

## 2023-11-14 ENCOUNTER — Encounter: Payer: Self-pay | Admitting: Physical Therapy

## 2023-11-14 ENCOUNTER — Ambulatory Visit: Payer: Medicare Other | Attending: Internal Medicine | Admitting: Physical Therapy

## 2023-11-14 DIAGNOSIS — R2681 Unsteadiness on feet: Secondary | ICD-10-CM | POA: Diagnosis present

## 2023-11-14 DIAGNOSIS — R2689 Other abnormalities of gait and mobility: Secondary | ICD-10-CM | POA: Diagnosis present

## 2023-11-14 NOTE — Therapy (Signed)
 OUTPATIENT PHYSICAL THERAPY NEURO TREATMENT NOTE   Patient Name: Bryan [name] MRN: [id number] DOB:09/16/1946, 78 y.o., male Today's Date: 11/14/2023   PCP: Claudene Prentice DELENA Mickey, FNP REFERRING PROVIDER: Jillian Buttery, MD  END OF SESSION:  PT End of Session - 11/14/23 1853     Visit Number 2    Number of Visits 7   with eval   Date for PT Re-Evaluation 12/30/23   to allow for scheduling delays   Authorization Type [employer];  as of 11-13-23 [facility] Medicare    PT Start Time 1450    PT Stop Time 1532    PT Time Calculation (min) 42 min    Equipment Utilized During Treatment --    Activity Tolerance Patient tolerated treatment well    Behavior During Therapy WFL for tasks assessed/performed              Past Medical History:  Diagnosis Date   Anemia    Arthritis    hands, knees, cervical area. Back pain. Rheumatoid arthritis- weekly injections.   BPH (benign prostatic hypertrophy)    Cancer (HCC)    Diabetes (HCC)    Essential tremor    GAD (generalized anxiety disorder) 12/05/2021   GERD (gastroesophageal reflux disease)    reports for indigestion he uses mustard    Hearing deficit    wears hearing aids bilateral   HTN (hypertension)    Obesity    OSA (obstructive sleep apnea)    PAF (paroxysmal atrial fibrillation) (HCC)    Refusal of blood transfusions as patient is Jehovah's Witness    Rheumatoid arthritis (HCC)    Right bundle branch block    history of   Seizures (HCC)    AS A CHILD. only esential tremors now.   Sleep apnea    cpap - settings at 9 per patient    Ulcer    Past Surgical History:  Procedure Laterality Date   ABDOMINAL EXPOSURE N/A 07/30/2022   Procedure: ABDOMINAL EXPOSURE;  Surgeon: Gretta Lonni PARAS, MD;  Location: [facility] OR;  Service: Vascular;  Laterality: N/A;   ANTERIOR CERVICAL DECOMP/DISCECTOMY FUSION N/A 03/30/2013   Procedure: ANTERIOR CERVICAL DECOMPRESSION/DISCECTOMY FUSION 2 LEVELS;  Surgeon: Lynwood JONELLE Mill, MD;  Location: [facility]  NEURO ORS;  Service: Neurosurgery;  Laterality: N/A;  C4-5 C5-6 Anterior cervical decompression/diskectomy/fusion/Allograft/Plate   ANTERIOR LUMBAR FUSION N/A 07/30/2022   Procedure: Anterior Lumbar  Interbody Fusion  - Lumbar five-sacral one posterior augmentation with iliac screws and O- arm;  Surgeon: Onetha Kuba, MD;  Location: [facility] OR;  Service: Neurosurgery;  Laterality: N/A;   CHOLECYSTECTOMY N/A 12/07/2013   Procedure: LAPAROSCOPIC CHOLECYSTECTOMY WITH INTRAOPERATIVE CHOLANGIOGRAM;  Surgeon: Elspeth KYM Schultze, MD;  Location: [facility] ORS;  Service: General;  Laterality: N/A;   COLONOSCOPY     DECOMPRESSIVE LUMBAR LAMINECTOMY LEVEL 2 N/A 02/15/2015   Procedure: COMPLETE DECOMPRESSIVE LUMBAR LAMINECTOMY L4-L5/ FORAMINOTOMY TO L4 NERVE ROOT AND L5 NERVE ROOT BILATERALLY;  Surgeon: Tanda Heading, MD;  Location: [facility] ORS;  Service: Orthopedics;  Laterality: N/A;   EYE SURGERY     right, growth excision   LAMINECTOMY WITH POSTERIOR LATERAL ARTHRODESIS LEVEL 1 N/A 07/30/2022   Procedure: LAMINECTOMY WITH POSTERIOR LATERAL ARTHRODESIS LUMBAR FIVE SACRAL ONE;  Surgeon: Onetha Kuba, MD;  Location: [facility] OR;  Service: Neurosurgery;  Laterality: N/A;   LUMBAR LAMINECTOMY/DECOMPRESSION MICRODISCECTOMY Left 01/18/2016   Procedure:  DECOMPRESSION L4-L5 MICRODISCECTOMY L4-L5 ON LEFT FOR SPINAL STENOSIS;  Surgeon: Tanda Heading, MD;  Location: [facility] ORS;  Service: Orthopedics;  Laterality: Left;   PAROTIDECTOMY Left 01/24/2018   Procedure: INCISIONAL BIOPSY OF LEFT PAROTID    MASS;  Surgeon: Terri Alan PARAS, MD;  Location: [facility] OR;  Service: ENT;  Laterality: Left;   SPINE SURGERY     TONSILLECTOMY     VASECTOMY     WRIST GANGLION EXCISION Left    Patient Active Problem List   Diagnosis Date Noted   Acute diverticulitis 10/10/2023   Chronic kidney disease 08/11/2022   Lumbar disc disorder with myelopathy 08/03/2022   Spinal stenosis of lumbosacral region 07/30/2022   Pre-op evaluation 07/26/2022   Congestive heart  failure (HCC) 06/27/2022   Erectile dysfunction 06/27/2022   Hearing loss, bilateral 06/27/2022   Lateral epicondylitis (tennis elbow) 06/27/2022   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, 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) 06/27/2022   Overweight 06/27/2022   Exposure to Agent [facility] 06/27/2022   Hand pain 06/27/2022   Pain in right knee 06/27/2022   Pseudoarthrosis of lumbar spine 06/27/2022   Sensorineural hearing loss, bilateral 06/27/2022   Tremor 06/27/2022   Unilateral primary osteoarthritis, right knee 06/27/2022   Benign essential hypertension 06/27/2022   Hypertensive disorder 06/27/2022   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 06/27/2022   Atrial fibrillation (HCC) 06/27/2022   Other persistent atrial fibrillation (HCC) 06/27/2022   Sleep apnea, unspecified 06/27/2022   Obstructive sleep apnea syndrome 06/27/2022   Obstructive sleep apnea (adult) (pediatric) 06/27/2022   Cervical radiculopathy 06/27/2022   Cervical spinal stenosis 06/27/2022   Radiculopathy, lumbar region 06/27/2022   Encounter for other administrative examinations 06/27/2022   Preoperative cardiovascular examination 06/27/2022   Degeneration of lumbar or lumbosacral intervertebral disc 06/27/2022   Pain in joint involving shoulder region 06/27/2022   Diarrhea, unspecified 06/27/2022   Encounter for fitting and adjustment of hearing aid 06/27/2022   Dyspnea on exertion 02/20/2022   Acute on chronic diastolic CHF (congestive heart failure) (HCC) 01/20/2022   History of pulmonary embolism 01/20/2022   Atrial fibrillation with RVR (HCC) 01/19/2022   OSA on CPAP 12/05/2021   GAD (generalized anxiety disorder) 12/05/2021   History of Hodgkin's lymphoma 12/05/2021   Hypoglycemia 12/05/2021   MALT (mucosa associated lymphoid tissue) 12/05/2021   Mixed hyperlipidemia 12/05/2021   Multiple pulmonary emboli (HCC) 12/05/2021   Normocytic anemia 12/05/2021   OA  (osteoarthritis) 12/05/2021   Primary hypertension 12/05/2021   Chronic pain syndrome 12/05/2021   Paroxysmal atrial flutter (HCC) 12/05/2021   Severe sepsis (HCC) 10/09/2021   New onset atrial fibrillation (HCC) 10/09/2021   Acute blood loss anemia 10/09/2021   Spinal stenosis of lumbar region 10/02/2021   Abnormal stress test 04/27/2020   Coronary artery disease involving native coronary artery of native heart without angina pectoris 04/27/2020   Irregular heart beat 04/27/2020   Spondylolisthesis at L4-L5 level 12/25/2019   Lumbar post-laminectomy syndrome 08/13/2019   Lumbar spondylosis 08/13/2019   DDD (degenerative disc disease), lumbar 01/16/2019   Lumbar radiculopathy 09/25/2018   Precordial pain 07/30/2018   Elevated troponin    Pain 02/25/2018   Marginal zone lymphoma (HCC) 02/02/2018   Lymphoma (HCC) 01/24/2018   Parotid mass 10/28/2017   Arthralgia of right lower leg 10/10/2017   Chronic low back pain 04/16/2017   Class 2 severe obesity due to excess calories with serious comorbidity and body mass index (BMI) of 39.0 to 39.9 in adult [facility]) 03/07/2017   ACE-inhibitor cough 03/07/2017   Lumbago 06/21/2016   Spinal stenosis, lumbar region, with neurogenic  claudication 02/15/2015   Obesity (BMI 30-39.9) 12/07/2013   Refusal of blood transfusions as patient is Jehovah's Witness 12/07/2013   Cervical spondylosis without myelopathy 04/27/2013   Rheumatoid arthritis (HCC) 10/26/2012   Nonspecific abnormal finding in stool contents 07/28/2012   Multiple joint pain 06/05/2012   History of tobacco use-  06/05/2012   Asthmatic bronchitis 12/15/2011   BPH (benign prostatic hyperplasia) 12/15/2011   Essential tremor 12/15/2011   Essential hypertension 12/15/2011    ONSET DATE: 10/13/2023 (referral date)  REFERRING DIAG: R53.1 (ICD-10-CM) - Weakness  THERAPY DIAG:  Unsteadiness on feet  Rationale for Evaluation and Treatment: Rehabilitation  SUBJECTIVE:                                                                                                                                                                                              SUBJECTIVE STATEMENT: Pt reports he was supposed to have had Rt knee surgery about 2 months ago by [facility] but had a UTI and they did not do the surgery.  Says he wants to have the injections in his knee but has had trouble getting referral to orthopedic MD from his PCP.  Pt says he does not want to use a cane because he does not want to look old.  Says he used a RW after back surgery 6 yrs ago - has a cane in the car but does not use it.  Pt reports he goes to [facility] - describes working with someone (NOTE:  unsure if pt is working with a psychologist, educational or is receiving PT at this facility).    Pt accompanied by: self, drives self  PERTINENT HISTORY: PMH includes Hodgkin lymphoma, paroxysmal A-fib on anticoagulation, RA, BPH, OSA on CPAP, hypertension, history of multiple pulmonary emboli, essential tremors on primidone , CKD, anemia, chronic HFpEF  PAIN:  Are you having pain? Yes, in low back and R knee (chronic, not rated)  PRECAUTIONS: Fall  RED FLAGS: None   WEIGHT BEARING RESTRICTIONS: No  FALLS: Has patient fallen in last 6 months? No  LIVING ENVIRONMENT: Lives with: lives with their spouse Lives in: House/apartment Stairs: No Has following equipment at home: Single point cane  PLOF: Independent with gait and Independent with transfers  PATIENT GOALS: improve balance  OBJECTIVE:  Note: Objective measures were completed at Evaluation unless otherwise noted.  DIAGNOSTIC FINDINGS:  None related to this POC  COGNITION: Overall cognitive status: No family/caregiver present to determine baseline cognitive functioning   SENSATION: No N/T in LE per patient report  LOWER EXTREMITY ROM:     Active  Right Eval Left Eval  Hip  flexion    Hip extension    Hip abduction    Hip adduction    Hip internal rotation     Hip external rotation    Knee flexion    Knee extension    Ankle dorsiflexion    Ankle plantarflexion    Ankle inversion    Ankle eversion     (Blank rows = not tested)  LOWER EXTREMITY MMT:    MMT Right Eval Left Eval  Hip flexion 5 5  Hip extension    Hip abduction    Hip adduction    Hip internal rotation    Hip external rotation    Knee flexion 5 5  Knee extension 5 5  Ankle dorsiflexion 5 5  Ankle plantarflexion    Ankle inversion    Ankle eversion    (Blank rows = not tested)  BED MOBILITY:  Mod I per patient report  TRANSFERS: Assistive device utilized: None  Sit to stand: Modified independence Stand to sit: Modified independence Chair to chair: Modified independence Floor:  not assessed at eval   STAIRS: Level of Assistance: CGA Stair Negotiation Technique: Alternating Pattern  with Bilateral Rails Number of Stairs: 6  Height of Stairs: 4  Comments: decreased speed/increased pain when descending due to R knee pain  GAIT: Distance walked: various clinic distances Assistive device utilized: None Level of assistance: Modified independence Comments: antalgic due to R knee pain, decreased gait speed  FUNCTIONAL TESTS:                                                                                                                                 Today's Treatment:  11-14-23 Self Care:  discussed pt's current limitations including chronic back pain and Rt knee pain with need for TKA, which he states was planned approx. 2 months ago through the [facility], but says he had a UTI and the surgery was cancelled.  Pt declines use of device (SPC) due to not wanting to look old.  Informed pt that balance was going to be impacted and significantly limited by Rt knee pain, with pt being unable to perform SLS activities on RLE due to c/o increased knee pain with full weight bearing. Discussed aquatic PT and benefits - recommend this service due to c/o pain with land based  exercises and inability to effectively work on balance training due to Rt knee pain  NeuroRe-ed:  Pt performed following exercises for balance HEP:  Pt stood with feet together on floor - EO - 30 secs:  with EC 15 secs with min. postural sway;  pt performed standing with feet together with EC with horizontal head turns 5 reps and vertical head turns 5 reps with moderate postural sway with CGA  Medbridge Access Code: [email or web address] URL: [email or web address] Date: 11/14/2023 Prepared by: Rock Kussmaul  Exercises - Standing Near Stance in Corner with Eyes Closed  - 1 x daily - 7 x weekly -  3 sets - 10 reps - Standing March with Counter Support  - 1 x daily - 7 x weekly - 1 sets - 10 reps - Standing Hip Flexion with Counter Support  - 1 x daily - 7 x weekly - 1 sets - 10 reps - Standing Hip Abduction with Counter Support  - 1 x daily - 7 x weekly - 1 sets - 10 reps - Standing Hip Extension with Counter Support  - 1 x daily - 7 x weekly - 1 sets - 10 reps - Single Leg Stance with Support  - 1 x daily - 7 x weekly - 1 sets - 1-2 reps - 5-10 sec hold  Attempted sit to stand for functional strengthening but pt reported Rt knee pain so this exercise was not issued for HEP   PATIENT EDUCATION: Education details: HEP - Medbridge - see above Person educated: Patient Education method: Explanation Education comprehension: verbalized understanding and needs further education  HOME EXERCISE PROGRAM: To be initiated  GOALS: Goals reviewed with patient? Yes  SHORT TERM GOALS: Target date: 11/25/2023   Pt will be independent with initial HEP for improved strength, balance, transfers and gait. Baseline: Goal status: INITIAL   LONG TERM GOALS: Target date: 12/19/2023   Pt will be independent with final HEP for improved strength, balance, transfers and gait. Baseline:  Goal status: INITIAL  2.  Pt will improve gait velocity to at least 2.75 ft/sec for improved gait efficiency and  performance at mod I level  Baseline: 2.66 ft/sec no AD mod I (12/23) Goal status: INITIAL  3.  Pt will improve FGA to 19/30 for decreased fall risk  Baseline: 14/30 (1/23) Goal status: INITIAL   ASSESSMENT:  CLINICAL IMPRESSION: PT session focused on establishing HEP for balance and for increased vestibular input.  Pt's balance is significantly limited and impacted by c/o Rt knee pain with pt unable to perform SLS on RLE without UE support due to c/o knee pain.  Pt declines use of SPC for assist with balance at this time.  Pt would benefit from aquatic PT due to c/o pain with weight bearing and need for UE support with balance exercises.  Pt's progress is limited with land based exercises.  Cont with POC.       OBJECTIVE IMPAIRMENTS: Abnormal gait, decreased balance, decreased knowledge of use of DME, difficulty walking, and pain.   ACTIVITY LIMITATIONS: carrying, lifting, bending, squatting, and stairs  PARTICIPATION LIMITATIONS: community activity  PERSONAL FACTORS: Age, Fitness, and 3+ comorbidities:     Hodgkin lymphoma, paroxysmal A-fib on anticoagulation, RA, BPH, OSA on CPAP, hypertension, history of multiple pulmonary emboli, essential tremors on primidone , CKD, anemia, chronic HFpEFare also affecting patient's functional outcome.   REHAB POTENTIAL: Good  CLINICAL DECISION MAKING: Stable/uncomplicated  EVALUATION COMPLEXITY: Low  PLAN:  PT FREQUENCY: 1x/week  PT DURATION: 6 weeks  PLANNED INTERVENTIONS: [id number]- PT Re-evaluation, 97110-Therapeutic exercises, 97530- Therapeutic activity, 97112- Neuromuscular re-education, 97535- Self Care, [id number]- Manual therapy, [phone]- Gait training, Patient/Family education, Balance training, Stair training, Dry Needling, Vestibular training, Visual/preceptual remediation/compensation, DME instructions, Cryotherapy, and Moist heat  PLAN FOR NEXT SESSION: check HEP issued on 11-14-23;  aquatic therapy?  initiate HEP for balance based on  FGA impairments (head turns, obstacle navigation, backwards gait, EC, tandem) being mindful of low back and R knee chronic pain   Kreston Ahrendt, [place], PT 11/14/2023, 6:57 PM [facility] [address], [address] [place], [place] [address]

## 2023-11-21 ENCOUNTER — Ambulatory Visit: Payer: Medicare Other | Admitting: Physical Therapy

## 2023-11-28 ENCOUNTER — Ambulatory Visit: Payer: Medicare Other | Admitting: Physical Therapy

## 2023-11-28 ENCOUNTER — Telehealth: Payer: Self-pay | Admitting: Physical Therapy

## 2023-12-04 ENCOUNTER — Inpatient Hospital Stay: Payer: No Typology Code available for payment source | Admitting: Hematology

## 2023-12-04 ENCOUNTER — Inpatient Hospital Stay: Payer: No Typology Code available for payment source | Attending: Hematology

## 2023-12-05 ENCOUNTER — Ambulatory Visit: Payer: Medicare Other | Admitting: Physical Therapy

## 2023-12-05 DIAGNOSIS — R2681 Unsteadiness on feet: Secondary | ICD-10-CM

## 2023-12-05 DIAGNOSIS — R2689 Other abnormalities of gait and mobility: Secondary | ICD-10-CM

## 2023-12-05 NOTE — Therapy (Signed)
OUTPATIENT PHYSICAL THERAPY NEURO TREATMENT NOTE - RECERTIFICATION   Patient Name: Bryan [name] MRN: [id number] DOB:09-28-1946, 78 y.o., male Today's Date: 12/06/2023   PCP: Raymon Mutton, FNP REFERRING PROVIDER: Burnadette Pop, MD  END OF SESSION:  PT End of Session - 12/05/23 1404     Visit Number 3    Number of Visits 8   recert   Date for PT Re-Evaluation 02/10/24   recert   Authorization Type [employer];  as of 11-13-23 [facility] Medicare    PT Start Time 1402    PT Stop Time 1434   ended early due to knee pain   PT Time Calculation (min) 32 min    Equipment Utilized During Treatment Gait belt    Activity Tolerance Patient tolerated treatment well    Behavior During Therapy WFL for tasks assessed/performed               Past Medical History:  Diagnosis Date   Anemia    Arthritis    hands, knees, cervical area. Back pain. Rheumatoid arthritis- weekly injections.   BPH (benign prostatic hypertrophy)    Cancer (HCC)    Diabetes (HCC)    Essential tremor    GAD (generalized anxiety disorder) 12/05/2021   GERD (gastroesophageal reflux disease)    reports for indigestion he uses mustard    Hearing deficit    wears hearing aids bilateral   HTN (hypertension)    Obesity    OSA (obstructive sleep apnea)    PAF (paroxysmal atrial fibrillation) (HCC)    Refusal of blood transfusions as patient is Jehovah's Witness    Rheumatoid arthritis (HCC)    Right bundle branch block    history of   Seizures (HCC)    AS A CHILD. only esential tremors now.   Sleep apnea    cpap - settings at 9 per patient    Ulcer    Past Surgical History:  Procedure Laterality Date   ABDOMINAL EXPOSURE N/A 07/30/2022   Procedure: ABDOMINAL EXPOSURE;  Surgeon: Cephus Shelling, MD;  Location: [facility] OR;  Service: Vascular;  Laterality: N/A;   ANTERIOR CERVICAL DECOMP/DISCECTOMY FUSION N/A 03/30/2013   Procedure: ANTERIOR CERVICAL DECOMPRESSION/DISCECTOMY FUSION 2 LEVELS;  Surgeon:  Clydene Fake, MD;  Location: [facility] OR[facility];  Service: Neurosurgery;  Laterality: N/A;  C4-5 C5-6 Anterior cervical decompression/diskectomy/fusion/Allograft/Plate   ANTERIOR LUMBAR FUSION N/A 07/30/2022   Procedure: Anterior Lumbar  Interbody Fusion  - Lumbar five-sacral one posterior augmentation with iliac screws and O- arm;  Surgeon: Donalee Citrin, MD;  Location: [facility] OR;  Service: Neurosurgery;  Laterality: N/A;   CHOLECYSTECTOMY N/A 12/07/2013   Procedure: LAPAROSCOPIC CHOLECYSTECTOMY WITH INTRAOPERATIVE CHOLANGIOGRAM;  Surgeon: Ardeth Sportsman, MD;  Location: [facility] ORS;  Service: General;  Laterality: N/A;   COLONOSCOPY     DECOMPRESSIVE LUMBAR LAMINECTOMY LEVEL 2 N/A 02/15/2015   Procedure: COMPLETE DECOMPRESSIVE LUMBAR LAMINECTOMY L4-L5/ FORAMINOTOMY TO L4 NERVE ROOT AND L5 NERVE ROOT BILATERALLY;  Surgeon: Ranee Gosselin, MD;  Location: [facility] ORS;  Service: Orthopedics;  Laterality: N/A;   EYE SURGERY     right, growth excision   LAMINECTOMY WITH POSTERIOR LATERAL ARTHRODESIS LEVEL 1 N/A 07/30/2022   Procedure: LAMINECTOMY WITH POSTERIOR LATERAL ARTHRODESIS LUMBAR FIVE SACRAL ONE;  Surgeon: Donalee Citrin, MD;  Location: [facility] OR;  Service: Neurosurgery;  Laterality: N/A;   LUMBAR LAMINECTOMY/DECOMPRESSION MICRODISCECTOMY Left 01/18/2016   Procedure:  DECOMPRESSION L4-L5 MICRODISCECTOMY L4-L5 ON LEFT FOR SPINAL STENOSIS;  Surgeon: Ranee Gosselin, MD;  Location:  [facility] ORS;  Service: Orthopedics;  Laterality: Left;   PAROTIDECTOMY Left 01/24/2018   Procedure: INCISIONAL BIOPSY OF LEFT PAROTID    MASS;  Surgeon: Graylin Shiver, MD;  Location: [facility] OR;  Service: ENT;  Laterality: Left;   SPINE SURGERY     TONSILLECTOMY     VASECTOMY     WRIST GANGLION EXCISION Left    Patient Active Problem List   Diagnosis Date Noted   Acute diverticulitis 10/10/2023   Chronic kidney disease 08/11/2022   Lumbar disc disorder with myelopathy 08/03/2022   Spinal stenosis of lumbosacral region 07/30/2022   Pre-op evaluation  07/26/2022   Congestive heart failure (HCC) 06/27/2022   Erectile dysfunction 06/27/2022   Hearing loss, bilateral 06/27/2022   Lateral epicondylitis (tennis elbow) 06/27/2022   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, 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) 06/27/2022   Overweight 06/27/2022   Exposure to [facility] 06/27/2022   Hand pain 06/27/2022   Pain in right knee 06/27/2022   Pseudoarthrosis of lumbar spine 06/27/2022   Sensorineural hearing loss, bilateral 06/27/2022   Tremor 06/27/2022   Unilateral primary osteoarthritis, right knee 06/27/2022   Benign essential hypertension 06/27/2022   Hypertensive disorder 06/27/2022   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 06/27/2022   Atrial fibrillation (HCC) 06/27/2022   Other persistent atrial fibrillation (HCC) 06/27/2022   Sleep apnea, unspecified 06/27/2022   Obstructive sleep apnea syndrome 06/27/2022   Obstructive sleep apnea (adult) (pediatric) 06/27/2022   Cervical radiculopathy 06/27/2022   Cervical spinal stenosis 06/27/2022   Radiculopathy, lumbar region 06/27/2022   Encounter for other administrative examinations 06/27/2022   Preoperative cardiovascular examination 06/27/2022   Degeneration of lumbar or lumbosacral intervertebral disc 06/27/2022   Pain in joint involving shoulder region 06/27/2022   Diarrhea, unspecified 06/27/2022   Encounter for fitting and adjustment of hearing aid 06/27/2022   Dyspnea on exertion 02/20/2022   Acute on chronic diastolic CHF (congestive heart failure) (HCC) 01/20/2022   History of pulmonary embolism 01/20/2022   Atrial fibrillation with RVR (HCC) 01/19/2022   OSA on CPAP 12/05/2021   GAD (generalized anxiety disorder) 12/05/2021   History of Hodgkin's lymphoma 12/05/2021   Hypoglycemia 12/05/2021   MALT (mucosa associated lymphoid tissue) 12/05/2021   Mixed hyperlipidemia 12/05/2021   Multiple pulmonary emboli (HCC) 12/05/2021    Normocytic anemia 12/05/2021   OA (osteoarthritis) 12/05/2021   Primary hypertension 12/05/2021   Chronic pain syndrome 12/05/2021   Paroxysmal atrial flutter (HCC) 12/05/2021   Severe sepsis (HCC) 10/09/2021   New onset atrial fibrillation (HCC) 10/09/2021   Acute blood loss anemia 10/09/2021   Spinal stenosis of lumbar region 10/02/2021   Abnormal stress test 04/27/2020   Coronary artery disease involving native coronary artery of native heart without angina pectoris 04/27/2020   Irregular heart beat 04/27/2020   Spondylolisthesis at L4-L5 level 12/25/2019   Lumbar post-laminectomy syndrome 08/13/2019   Lumbar spondylosis 08/13/2019   DDD (degenerative disc disease), lumbar 01/16/2019   Lumbar radiculopathy 09/25/2018   Precordial pain 07/30/2018   Elevated troponin    Pain 02/25/2018   Marginal zone lymphoma (HCC) 02/02/2018   Lymphoma (HCC) 01/24/2018   Parotid mass 10/28/2017   Arthralgia of right lower leg 10/10/2017   Chronic low back pain 04/16/2017   Class 2 severe obesity due to excess calories with serious comorbidity and body mass index (BMI) of 39.0 to 39.9 in adult (HCC) 03/07/2017   ACE-inhibitor cough 03/07/2017   Lumbago 06/21/2016  Spinal stenosis, lumbar region, with neurogenic claudication 02/15/2015   Obesity (BMI 30-39.9) 12/07/2013   Refusal of blood transfusions as patient is Jehovah's Witness 12/07/2013   Cervical spondylosis without myelopathy 04/27/2013   Rheumatoid arthritis (HCC) 10/26/2012   Nonspecific abnormal finding in stool contents 07/28/2012   Multiple joint pain 06/05/2012   History of tobacco use-  06/05/2012   Asthmatic bronchitis 12/15/2011   BPH (benign prostatic hyperplasia) 12/15/2011   Essential tremor 12/15/2011   Essential hypertension 12/15/2011    ONSET DATE: 10/13/2023 (referral date)  REFERRING DIAG: R53.1 (ICD-10-CM) - Weakness  THERAPY DIAG:  Unsteadiness on feet  Other abnormalities of gait and  mobility  Rationale for Evaluation and Treatment: Rehabilitation  SUBJECTIVE:                                                                                                                                                                                             SUBJECTIVE STATEMENT: Pt reports he is not doing well, having ongoing back pain and R knee pain. Pt unsure if he wants to pursue a gel injection or knee replacement surgery, needs to reach out the [place].  Pt has not been working on his HEP, "I'm lazy". Pt does work with a Systems analyst every Tuesday (has TENS placed on his knee or back during exercises).  Pt accompanied by: self, drives self  PERTINENT HISTORY: PMH includes Hodgkin lymphoma, paroxysmal A-fib on anticoagulation, RA, BPH, OSA on CPAP, hypertension, history of multiple pulmonary emboli, essential tremors on primidone, CKD, anemia, chronic HFpEF  PAIN:  Are you having pain? Yes, in low back and R knee (7/10, low back pain; 8/10 R knee pain)  PRECAUTIONS: Fall  RED FLAGS: None   WEIGHT BEARING RESTRICTIONS: No  FALLS: Has patient fallen in last 6 months? No  LIVING ENVIRONMENT: Lives with: lives with their spouse Lives in: House/apartment Stairs: No Has following equipment at home: Single point cane  PLOF: Independent with gait and Independent with transfers  PATIENT GOALS: improve balance  OBJECTIVE:  Note: Objective measures were completed at Evaluation unless otherwise noted.  DIAGNOSTIC FINDINGS:  None related to this POC  COGNITION: Overall cognitive status: No family/caregiver present to determine baseline cognitive functioning   SENSATION: No N/T in LE per patient report  LOWER EXTREMITY ROM:     Active  Right Eval Left Eval  Hip flexion    Hip extension    Hip abduction    Hip adduction    Hip internal rotation    Hip external rotation    Knee flexion    Knee extension    Ankle dorsiflexion  Ankle plantarflexion    Ankle  inversion    Ankle eversion     (Blank rows = not tested)  LOWER EXTREMITY MMT:    MMT Right Eval Left Eval  Hip flexion 5 5  Hip extension    Hip abduction    Hip adduction    Hip internal rotation    Hip external rotation    Knee flexion 5 5  Knee extension 5 5  Ankle dorsiflexion 5 5  Ankle plantarflexion    Ankle inversion    Ankle eversion    (Blank rows = not tested)  BED MOBILITY:  Mod I per patient report  TRANSFERS: Assistive device utilized: None  Sit to stand: Modified independence Stand to sit: Modified independence Chair to chair: Modified independence Floor:  not assessed at eval   STAIRS: Level of Assistance: CGA Stair Negotiation Technique: Alternating Pattern  with Bilateral Rails Number of Stairs: 6  Height of Stairs: 4  Comments: decreased speed/increased pain when descending due to R knee pain  GAIT: Distance walked: various clinic distances Assistive device utilized: None Level of assistance: Modified independence Comments: antalgic due to R knee pain, decreased gait speed  FUNCTIONAL TESTS:                                                                                                                                 Today's Treatment:   TherEx Review of previously issued HEP: Corner balance: Romberg stance with EC 2 x 30 sec each Standing marches x 10 reps B Standing hip abd x 10 reps B Standing hip flex x 10 reps B Standing hip ext x 10 reps B Onset of severe R knee pain, needs seated rest break Removed SLS due to pain in SLS  TherAct Discussion of PT POC with plan to transition to aquatic PT going forwards.   PATIENT EDUCATION: Education details: review of HEP, discussion of PT POC Person educated: Patient Education method: Medical illustrator Education comprehension: verbalized understanding, returned demonstration, and needs further education  HOME EXERCISE PROGRAM: Medbridge Access Code: [email or web address] URL:  [email or web address] Date: 11/14/2023 Prepared by: Maebelle Munroe  Exercises - Standing Near Stance in Corner with Eyes Closed  - 1 x daily - 7 x weekly - 3 sets - 10 reps - Standing March with Counter Support  - 1 x daily - 7 x weekly - 1 sets - 10 reps - Standing Hip Flexion with Counter Support  - 1 x daily - 7 x weekly - 1 sets - 10 reps - Standing Hip Abduction with Counter Support  - 1 x daily - 7 x weekly - 1 sets - 10 reps - Standing Hip Extension with Counter Support  - 1 x daily - 7 x weekly - 1 sets - 10 reps   GOALS: Goals reviewed with patient? Yes  SHORT TERM GOALS: Target date: 11/25/2023   Pt will be independent with initial HEP for improved  strength, balance, transfers and gait. Baseline: not met as pt not performing Goal status: NOT MET   LONG TERM GOALS: Target date: 12/19/2023   Pt will be independent with final HEP for improved strength, balance, transfers and gait. Baseline: not met as pt not performing Goal status: NOT MET  2.  Pt will improve gait velocity to at least 2.75 ft/sec for improved gait efficiency and performance at mod I level  Baseline: 2.66 ft/sec no AD mod I (12/23), not reassessed due to pain Goal status: NOT MET  3.  Pt will improve FGA to 19/30 for decreased fall risk  Baseline: 14/30 (1/23), not reassessed due to pain Goal status: NOT MET   NEW SHORT TERM GOALS: Target date: 01/20/2024   Pt will be independent with initial aquatic HEP for improved strength and management of his pain symptoms. Baseline:  Goal status: INITIAL    NEW LONG TERM GOALS: Target date: 02/10/2024   Pt will be independent with final aquatic HEP for improved strength and management of his pain symptoms. Baseline:  Goal status: INITIAL    ASSESSMENT:  CLINICAL IMPRESSION: Session limited by significant R knee pain. Emphasis of skilled PT session on reassessing STG and LTG for recertification of PT services, reviewing previously  prescribed HEP, and discussing PT POC with plan for aquatic PT visits going forward due to poor tolerance for land-based activities due to knee pain. Removed SLS from HEP due to increase in pain in R knee in SLS, pt able to perform all other exercises safely and appropriately. Pt has met 0/1 STG and 0/3 LTG as he has not yet attempted to perform his HEP at home and FGA and gait speed not assessed due to pain. Pt will benefit from aquatic PT to work towards improving his strength and his functional mobility in a more tolerated setting. Continue POC.       OBJECTIVE IMPAIRMENTS: Abnormal gait, decreased balance, decreased knowledge of use of DME, difficulty walking, and pain.   ACTIVITY LIMITATIONS: carrying, lifting, bending, squatting, and stairs  PARTICIPATION LIMITATIONS: community activity  PERSONAL FACTORS: Age, Fitness, and 3+ comorbidities:     Hodgkin lymphoma, paroxysmal A-fib on anticoagulation, RA, BPH, OSA on CPAP, hypertension, history of multiple pulmonary emboli, essential tremors on primidone, CKD, anemia, chronic HFpEFare also affecting patient's functional outcome.   REHAB POTENTIAL: Good  CLINICAL DECISION MAKING: Stable/uncomplicated  EVALUATION COMPLEXITY: Low  PLAN:  PT FREQUENCY: 1x/week  PT DURATION: 6 weeks + 6 visits (10 weeks due to delay in scheduling)  PLANNED INTERVENTIONS: 16109- PT Re-evaluation, 97110-Therapeutic exercises, 97530- Therapeutic activity, 97112- Neuromuscular re-education, 97535- Self Care, [id number]- Manual therapy, [phone]- Gait training, Patient/Family education, Balance training, Stair training, Dry Needling, Vestibular training, Visual/preceptual remediation/compensation, DME instructions, Cryotherapy, and Moist heat  PLAN FOR NEXT SESSION: aquatic PT to work on low back and R knee exercises in a more tolerated environment, balance as applicable   Peter Congo, PT Peter Congo, PT, DPT, CSRS  12/06/2023, 9:02 AM [facility] [address], [address] [place], [place] [address]

## 2023-12-12 ENCOUNTER — Ambulatory Visit: Payer: Medicare PPO | Admitting: Physical Therapy

## 2023-12-19 ENCOUNTER — Ambulatory Visit: Payer: Medicare PPO | Admitting: Physical Therapy

## 2024-01-03 ENCOUNTER — Telehealth: Payer: Self-pay | Admitting: Hematology

## 2024-01-03 NOTE — Telephone Encounter (Signed)
 Spoke with patient confirming upcoming appointment

## 2024-01-06 ENCOUNTER — Ambulatory Visit: Payer: Medicare Other | Attending: Internal Medicine | Admitting: Physical Therapy

## 2024-01-13 ENCOUNTER — Ambulatory Visit: Payer: Medicare Other | Admitting: Physical Therapy

## 2024-01-17 ENCOUNTER — Encounter: Payer: Self-pay | Admitting: Pulmonary Disease

## 2024-01-20 ENCOUNTER — Ambulatory Visit: Payer: Medicare Other | Admitting: Physical Therapy

## 2024-01-22 ENCOUNTER — Inpatient Hospital Stay: Payer: Medicare Other | Attending: Hematology

## 2024-01-22 ENCOUNTER — Inpatient Hospital Stay (HOSPITAL_BASED_OUTPATIENT_CLINIC_OR_DEPARTMENT_OTHER): Payer: Medicare Other | Admitting: Hematology

## 2024-01-22 VITALS — BP 138/62 | HR 73 | Temp 97.6°F | Resp 16 | Wt 219.5 lb

## 2024-01-22 DIAGNOSIS — Z77098 Contact with and (suspected) exposure to other hazardous, chiefly nonmedicinal, chemicals: Secondary | ICD-10-CM | POA: Insufficient documentation

## 2024-01-22 DIAGNOSIS — Z79899 Other long term (current) drug therapy: Secondary | ICD-10-CM | POA: Diagnosis not present

## 2024-01-22 DIAGNOSIS — E611 Iron deficiency: Secondary | ICD-10-CM | POA: Diagnosis not present

## 2024-01-22 DIAGNOSIS — Z8572 Personal history of non-Hodgkin lymphomas: Secondary | ICD-10-CM | POA: Diagnosis present

## 2024-01-22 DIAGNOSIS — C8580 Other specified types of non-Hodgkin lymphoma, unspecified site: Secondary | ICD-10-CM | POA: Diagnosis not present

## 2024-01-22 DIAGNOSIS — R194 Change in bowel habit: Secondary | ICD-10-CM | POA: Insufficient documentation

## 2024-01-22 DIAGNOSIS — Z87891 Personal history of nicotine dependence: Secondary | ICD-10-CM | POA: Diagnosis not present

## 2024-01-22 DIAGNOSIS — E538 Deficiency of other specified B group vitamins: Secondary | ICD-10-CM | POA: Insufficient documentation

## 2024-01-22 DIAGNOSIS — M069 Rheumatoid arthritis, unspecified: Secondary | ICD-10-CM | POA: Insufficient documentation

## 2024-01-22 DIAGNOSIS — Z9221 Personal history of antineoplastic chemotherapy: Secondary | ICD-10-CM | POA: Diagnosis not present

## 2024-01-22 DIAGNOSIS — I10 Essential (primary) hypertension: Secondary | ICD-10-CM | POA: Insufficient documentation

## 2024-01-22 DIAGNOSIS — R61 Generalized hyperhidrosis: Secondary | ICD-10-CM | POA: Insufficient documentation

## 2024-01-22 DIAGNOSIS — G250 Essential tremor: Secondary | ICD-10-CM | POA: Diagnosis not present

## 2024-01-22 LAB — CMP (CANCER CENTER ONLY)
ALT: 9 U/L (ref 0–44)
AST: 14 U/L — ABNORMAL LOW (ref 15–41)
Albumin: 4.1 g/dL (ref 3.5–5.0)
Alkaline Phosphatase: 203 U/L — ABNORMAL HIGH (ref 38–126)
Anion gap: 5 (ref 5–15)
BUN: 30 mg/dL — ABNORMAL HIGH (ref 8–23)
CO2: 30 mmol/L (ref 22–32)
Calcium: 8.7 mg/dL — ABNORMAL LOW (ref 8.9–10.3)
Chloride: 104 mmol/L (ref 98–111)
Creatinine: 1.68 mg/dL — ABNORMAL HIGH (ref 0.61–1.24)
GFR, Estimated: 41 mL/min — ABNORMAL LOW (ref >60)
Glucose, Bld: 78 mg/dL (ref 70–99)
Potassium: 4.3 mmol/L (ref 3.5–5.1)
Sodium: 139 mmol/L (ref 135–145)
Total Bilirubin: 0.4 mg/dL (ref 0.0–1.2)
Total Protein: 7.5 g/dL (ref 6.5–8.1)

## 2024-01-22 LAB — VITAMIN B12: Vitamin B-12: 327 pg/mL (ref 180–914)

## 2024-01-22 LAB — CBC WITH DIFFERENTIAL (CANCER CENTER ONLY)
Abs Immature Granulocytes: 0.01 10*3/uL (ref 0.00–0.07)
Basophils Absolute: 0 10*3/uL (ref 0.0–0.1)
Basophils Relative: 0 %
Eosinophils Absolute: 0.1 10*3/uL (ref 0.0–0.5)
Eosinophils Relative: 2 %
HCT: 39.6 % (ref 39.0–52.0)
Hemoglobin: 12.7 g/dL — ABNORMAL LOW (ref 13.0–17.0)
Immature Granulocytes: 0 %
Lymphocytes Relative: 20 %
Lymphs Abs: 0.9 10*3/uL (ref 0.7–4.0)
MCH: 31.1 pg (ref 26.0–34.0)
MCHC: 32.1 g/dL (ref 30.0–36.0)
MCV: 97.1 fL (ref 80.0–100.0)
Monocytes Absolute: 0.4 10*3/uL (ref 0.1–1.0)
Monocytes Relative: 10 %
Neutro Abs: 2.9 10*3/uL (ref 1.7–7.7)
Neutrophils Relative %: 68 %
Platelet Count: 231 10*3/uL (ref 150–400)
RBC: 4.08 MIL/uL — ABNORMAL LOW (ref 4.22–5.81)
RDW: 14.1 % (ref 11.5–15.5)
WBC Count: 4.3 10*3/uL (ref 4.0–10.5)
nRBC: 0 % (ref 0.0–0.2)

## 2024-01-22 LAB — LACTATE DEHYDROGENASE: LDH: 164 U/L (ref 98–192)

## 2024-01-22 LAB — FERRITIN: Ferritin: 48 ng/mL (ref 24–336)

## 2024-01-22 NOTE — Progress Notes (Signed)
 HEMATOLOGY ONCOLOGY CLINIC NOTE  Date of Service: 01/22/24    Patient Care Team: Raymon Mutton., FNP as PCP - General (Family Medicine) Croitoru, Rachelle Hora, MD as PCP - Cardiology (Cardiology) Tarri Glenn, Johnathan Hausen, PA-C as Physician Assistant (Emergency Medicine) Ranee Gosselin, MD as Consulting Physician (Orthopedic Surgery) Johney Maine, MD as Consulting Physician (Oncology) Graylin Shiver, MD as Consulting Physician (Otolaryngology) Felecia Shelling, DPM as Consulting Physician (Podiatry) Hunsucker, Lesia Sago, MD as Consulting Physician (Pulmonary Disease)  CHIEF COMPLAINTS F/u for  Marginal Zone lymphoma   HISTORY OF PRESENTING ILLNESS:   Bryan [name] 78 y.o. male is here because of a referral from ENT Dr. Billy Fischer regarding a concern of MALT lymphoma.   He is accompanied today by 4 members of his family. The pt reports that he is doing well overall. The pt's PCP is Dr. Clelia Croft. The pt sees Dr. Azucena Fallen for his Rheumatology. The pt went off of humira a month before his mass appeared two months. The pt takes Methotrexate 15 mg once each week for his rheumatoid arthritis. He notes that he took humira for 8-10 months, and hasn't taken it in about 3 months. He began taking prednisone 15mg  q day, 2 months ago.   He reports having high blood pressure and takes amlodipine, metoprolol for it. He reports having had back and neck surgery for discs.  He also notes having essential tremors in his hands for which he takes primidone.  He reports having run out of flomax. He has had some PTSD from his time in the [place] war.  He notes he has had drenching night sweats for the last 15 years that are of intermittent frequency. He denies any other medical issues.    He first noticed swelling of his left cheek about two months ago. He notes that it was "hard like a rock." He was placed on prednisone for about a month after going to the doctor, with some relief. He then  had his needle Bx recorded below.   He reports gum pain that began this morning, and also reports that his tongue feels as if it has bumps on it. He reports taking folic acid 1mg  each day.    On 10/29/17 the pt had a neck CT revealing Multifocal left parotid lesion. Multiple ill-defined enhancing nodules in the left parotid gland. Asymmetric enhancing lymph nodes in the left neck are not pathologically enlarged however given the asymmetry and the left parotid lesion, they could represent neoplastic spread of parotid malignancy or lymphoma. Tissue sampling recommended.  Of note prior to the patient's visit, pt has had a biopsy of his left parotid gland completed on 12/03/17 with results revealing Atypical lymphoid proliferation. 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 -   B cell clonality study was positive for clonality.  On review of systems, pt reports gum pain, fatigue (not recent), occasional night sweats, ankle pain, and denies abdominal pains, back pain, flank pain, leg swelling, swollen or painful joints beside ankles.   On PMHx the pt has had rheumatoid arthritis for 6 years and HTN. He notes that he has anemia and is a Public librarian Witness I- which he notes means he would not want to consider blood products  On Surgical Hx the pt had back surgery. On Social Hx the pt quit smoking in 1976 after smoking about 8 cigarettes  each day for 15 years. He notes drinking ETOH about once a week. He denies chemical or radiation exposure, except for agent orange.  On Family Hx the pt notes high blood pressure, DM, but denies autoimmune conditions, cancers, or blood disorders.    INTERVAL HISTORY:   Bryan [name] is here for a scheduled follow-up of his marginal zone lymphoma.   Patient was last seen by me on 10/04/2022 and he does complain of back pain and mild  bilateral leg swelling.   Patient notes he has been doing well overall since our last visit. He denies any new infection issues, fever, chills, night sweats, unexpected weight loss, back pain, chest pain, abdominal pain. He does complain of mld bilateral leg swelling and mild occasional acid reflux. He has been prescribed medication for his leg swelling   He notes that his rheumatoid arthritis is the same since the last visit. He is still on Rituxan maintenance.   Patient notes he recently had Prostate MRI, awaiting result. He continues to follow-up with his Urologist. He denies any new urinary symptoms. He does report increased bowel movement after eating meal. He has been referred to Gastroenterologist.   Patient notes he has discontinued Gabapentin since it was causing him brain fogs and mild memory loss.   He denies taking Vitamin B-12 or B-complex supplements.   Patient notes he is planning to have knee surgery on his right knee.   REVIEW OF SYSTEMS:   .10 Point review of Systems was done is negative except as noted above.  MEDICAL HISTORY:  Past Medical History:  Diagnosis Date   Anemia    Arthritis    hands, knees, cervical area. Back pain. Rheumatoid arthritis- weekly injections.   BPH (benign prostatic hypertrophy)    Cancer (HCC)    Diabetes (HCC)    Essential tremor    GAD (generalized anxiety disorder) 12/05/2021   GERD (gastroesophageal reflux disease)    reports for indigestion he uses mustard    Hearing deficit    wears hearing aids bilateral   HTN (hypertension)    Obesity    OSA (obstructive sleep apnea)    PAF (paroxysmal atrial fibrillation) (HCC)    Refusal of blood transfusions as patient is Jehovah's Witness    Rheumatoid arthritis (HCC)    Right bundle branch block    history of   Seizures (HCC)    AS A CHILD. only esential tremors now.   Sleep apnea    cpap - settings at 9 per patient    Ulcer   JEHOVA's WITNESS  SURGICAL HISTORY: Past Surgical  History:  Procedure Laterality Date   ABDOMINAL EXPOSURE N/A 07/30/2022   Procedure: ABDOMINAL EXPOSURE;  Surgeon: Cephus Shelling, MD;  Location: [facility] OR;  Service: Vascular;  Laterality: N/A;   ANTERIOR CERVICAL DECOMP/DISCECTOMY FUSION N/A 03/30/2013   Procedure: ANTERIOR CERVICAL DECOMPRESSION/DISCECTOMY FUSION 2 LEVELS;  Surgeon: Clydene Fake, MD;  Location: [facility];  Service: Neurosurgery;  Laterality: N/A;  C4-5 C5-6 Anterior cervical decompression/diskectomy/fusion/Allograft/Plate   ANTERIOR LUMBAR FUSION N/A 07/30/2022   Procedure: Anterior Lumbar  Interbody Fusion  - Lumbar five-sacral one posterior augmentation with iliac screws and O- arm;  Surgeon: Donalee Citrin, MD;  Location: [facility] OR;  Service: Neurosurgery;  Laterality: N/A;   CHOLECYSTECTOMY N/A 12/07/2013   Procedure: LAPAROSCOPIC CHOLECYSTECTOMY WITH INTRAOPERATIVE CHOLANGIOGRAM;  Surgeon: Ardeth Sportsman, MD;  Location: [facility] ORS;  Service: General;  Laterality: N/A;   COLONOSCOPY     DECOMPRESSIVE LUMBAR LAMINECTOMY LEVEL  2 N/A 02/15/2015   Procedure: COMPLETE DECOMPRESSIVE LUMBAR LAMINECTOMY L4-L5/ FORAMINOTOMY TO L4 NERVE ROOT AND L5 NERVE ROOT BILATERALLY;  Surgeon: Ranee Gosselin, MD;  Location: [facility] ORS;  Service: Orthopedics;  Laterality: N/A;   EYE SURGERY     right, growth excision   LAMINECTOMY WITH POSTERIOR LATERAL ARTHRODESIS LEVEL 1 N/A 07/30/2022   Procedure: LAMINECTOMY WITH POSTERIOR LATERAL ARTHRODESIS LUMBAR FIVE SACRAL ONE;  Surgeon: Donalee Citrin, MD;  Location: [facility] OR;  Service: Neurosurgery;  Laterality: N/A;   LUMBAR LAMINECTOMY/DECOMPRESSION MICRODISCECTOMY Left 01/18/2016   Procedure:  DECOMPRESSION L4-L5 MICRODISCECTOMY L4-L5 ON LEFT FOR SPINAL STENOSIS;  Surgeon: Ranee Gosselin, MD;  Location: [facility] ORS;  Service: Orthopedics;  Laterality: Left;   PAROTIDECTOMY Left 01/24/2018   Procedure: INCISIONAL BIOPSY OF LEFT PAROTID    MASS;  Surgeon: Graylin Shiver, MD;  Location: [facility] OR;  Service: ENT;  Laterality:  Left;   SPINE SURGERY     TONSILLECTOMY     VASECTOMY     WRIST GANGLION EXCISION Left     SOCIAL HISTORY: Social History   Socioeconomic History   Marital status: Married    Spouse name: Not on file   Number of children: 2   Years of education: Not on file   Highest education level: Not on file  Occupational History   Occupation: bus driver    Employer: RETIRED  Tobacco Use   Smoking status: Former    Current packs/day: 0.00    Average packs/day: 0.5 packs/day for 14.0 years (7.0 ttl pk-yrs)    Types: Cigarettes    Start date: 12/14/1958    Quit date: 12/14/1972    Years since quitting: 51.1   Smokeless tobacco: Never  Vaping Use   Vaping status: Never Used  Substance and Sexual Activity   Alcohol use: Yes    Comment: occasionally   Drug use: No   Sexual activity: Yes    Birth control/protection: None  Other Topics Concern   Not on file  Social History Narrative   Lives with wife   Social Drivers of Health   Financial Resource Strain: Low Risk  (08/02/2022)   Overall Financial Resource Strain (CARDIA)    Difficulty of Paying Living Expenses: Not hard at all  Food Insecurity: No Food Insecurity (10/10/2023)   Hunger Vital Sign    Worried About Running Out of Food in the Last Year: Never true    Ran Out of Food in the Last Year: Never true  Transportation Needs: No Transportation Needs (10/10/2023)   PRAPARE - Administrator, Civil Service (Medical): No    Lack of Transportation (Non-Medical): No  Physical Activity: Not on file  Stress: No Stress Concern Present (12/07/2021)   Received [facility] of Occupational Health - Occupational Stress Questionnaire    Feeling of Stress : Not at all  Social Connections: Unknown (03/23/2022)   [facility]   Social Network    Social Network: Not on file  Intimate Partner Violence: Not At Risk (10/10/2023)   Humiliation, Afraid, Rape, and  Kick questionnaire    Fear of Current or Ex-Partner: No    Emotionally Abused: No    Physically Abused: No    Sexually Abused: No    FAMILY HISTORY: Family History  Problem Relation Age of Onset   Colon cancer Maternal Grandfather    Kidney disease Brother    Hypertension Brother    Diabetes Brother  Stroke Maternal Grandmother    Diabetes Mother    Hypertension Mother    CAD Father        died of MI at age 78   Hypertension Father    Diabetes Paternal Aunt        x 4 aunts    ALLERGIES:  is allergic to terazosin, adalimumab, gabapentin, statins, ampicillin, lisinopril, and other.  MEDICATIONS:  Current Outpatient Medications  Medication Sig Dispense Refill   acetaminophen (TYLENOL) 325 MG tablet Take 1-2 tablets (325-650 mg total) by mouth every 4 (four) hours as needed for mild pain.     amLODipine (NORVASC) 5 MG tablet Take 5 mg by mouth daily.     edoxaban (SAVAYSA) 30 MG TABS tablet Take 30 mg by mouth daily.     HYDROcodone-acetaminophen (NORCO/VICODIN) 5-325 MG tablet Take 1 tablet by mouth every 4 (four) hours as needed. 6 tablet 0   metoprolol (TOPROL-XL) 200 MG 24 hr tablet Take 100 mg by mouth daily.     Multiple Vitamin (MULTIVITAMIN WITH MINERALS) TABS tablet Take 1 tablet by mouth daily.     nitroGLYCERIN (NITROSTAT) 0.4 MG SL tablet Place 0.4 mg under the tongue every 5 (five) minutes as needed for chest pain. (Patient not taking: Reported on 10/10/2023)     ondansetron (ZOFRAN) 4 MG tablet Take 1 tablet (4 mg total) by mouth every 8 (eight) hours as needed for nausea or vomiting. 20 tablet 0   polyethylene glycol (MIRALAX / GLYCOLAX) 17 g packet Take 17 g by mouth daily as needed for mild constipation.     primidone (MYSOLINE) 50 MG tablet Take 100 mg by mouth 2 (two) times daily.     riTUXimab (RITUXAN) 500 MG/50ML injection Inject into the vein See admin instructions. Unknown dosage, unconfirmed frequency. See notes.     Tamsulosin HCl (FLOMAX) 0.4 MG CAPS  Take 0.8 mg by mouth at bedtime.     No current facility-administered medications for this visit.    PHYSICAL EXAMINATION: ECOG FS:1 - Symptomatic but completely ambulatory  Vitals:   01/22/24 1023  BP: 138/62  Pulse: 73  Resp: 16  Temp: 97.6 F (36.4 C)  SpO2: 99%     Wt Readings from Last 3 Encounters:  10/10/23 220 lb (99.8 kg)  04/24/23 215 lb (97.5 kg)  09/26/22 216 lb 9.6 oz (98.2 kg)   Body mass index is 36.53 kg/m.    [name] GENERAL:alert, in no acute distress and comfortable SKIN: no acute rashes, no significant lesions EYES: conjunctiva are pink and non-injected, sclera anicteric OROPHARYNX: MMM, no exudates, no oropharyngeal erythema or ulceration NECK: supple, no JVD LYMPH:  no palpable lymphadenopathy in the cervical, axillary or inguinal regions LUNGS: clear to auscultation b/l with normal respiratory effort HEART: regular rate & rhythm ABDOMEN:  normoactive bowel sounds , non tender, not distended. Extremity: no pedal edema PSYCH: alert & oriented x 3 with fluent speech NEURO: no focal motor/sensory deficits   LABS .    Latest Ref Rng & Units 01/22/2024    9:42 AM 10/11/2023    4:53 AM 10/10/2023   11:10 AM  CBC  WBC 4.0 - 10.5 K/uL 4.3  7.6  7.4   Hemoglobin 13.0 - 17.0 g/dL 16.1  09.6  04.5   Hematocrit 39.0 - 52.0 % 39.6  34.1  37.5   Platelets 150 - 400 K/uL 231  191  206     CBC    Component Value Date/Time   WBC 7.6 10/11/2023  0453   RBC 3.54 (L) 10/11/2023 0453   HGB 10.8 (L) 10/11/2023 0453   HGB 10.9 (L) 09/26/2022 1337   HCT 34.1 (L) 10/11/2023 0453   HCT 42.7 12/19/2017 1416   PLT 191 10/11/2023 0453   PLT 238 09/26/2022 1337   MCV 96.3 10/11/2023 0453   MCV 94.0 05/09/2018 1805   MCH 30.5 10/11/2023 0453   MCHC 31.7 10/11/2023 0453   RDW 14.1 10/11/2023 0453   LYMPHSABS 1.1 09/26/2022 1337   MONOABS 0.6 09/26/2022 1337   EOSABS 0.1 09/26/2022 1337   BASOSABS 0.0 09/26/2022 1337  .    Latest Ref Rng & Units 01/22/2024     9:42 AM 10/12/2023    2:59 AM 10/11/2023    4:53 AM  CMP  Glucose 70 - 99 mg/dL 78  84  94   BUN 8 - 23 mg/dL 30  19  20    Creatinine 0.61 - 1.24 mg/dL 5.78  4.69  6.29   Sodium 135 - 145 mmol/L 139  137  136   Potassium 3.5 - 5.1 mmol/L 4.3  4.3  4.4   Chloride 98 - 111 mmol/L 104  108  105   CO2 22 - 32 mmol/L 30  24  25    Calcium 8.9 - 10.3 mg/dL 8.7  8.3  8.2   Total Protein 6.5 - 8.1 g/dL 7.5   6.2   Total Bilirubin 0.0 - 1.2 mg/dL 0.4   0.5   Alkaline Phos 38 - 126 U/L 203   127   AST 15 - 41 U/L 14   17   ALT 0 - 44 U/L 9   15      LABORATORY DATA:       01/27/18 Flow Cytometry:   01/27/18 Parotid Gland Surgical Pathology   RADIOGRAPHIC STUDIES: I have personally reviewed the radiological images as listed and agreed with the findings in the report.  ASSESSMENT & PLAN:  Bryan [name] is a 78 y.o. male with    1.history of stage IIE Marginal-zone lymphoma involving the left parotid gland, left cervical LN and mediastinal lymph nodes. Currently in remission Hep C neg and Hep B -He completed 4 dose Rituxan 02/07/18-02/28/18.  LDH has normalized and the Parotid mass is no longer palpable. 04/22/18 PET/CT revealed complete metabolic response to therapy. No residual hypermetabolic mass or lymphadenopathy.  He has continued to be on Rituxan every 13 weeks by his rheumatologist for management of his rheumatoid arthritis but this is probably also serving as maintenance treatment for his marginal zone lymphoma.  2. Jehovas Witness- declines any use of blood products.  3. Rheumatoid Arthritis  Rituxan seems to have helped his RA as well. - off  MTX and prednisone and is currently on plaquenil per his rheumatologist, Dr. Dierdre Forth and PA Azucena Fallen.  -I encouraged him to be more active with at least walking and possibly start PT or Silver Sneakers program.  4. Low B12 levels B12 level still low at 209 -Increase to B12 SL SL daily  5.  Mild iron deficiency Lab  Results  Component Value Date   FERRITIN 48 01/22/2024    PLAN: -Discussed lab results from today, 02/04/2024, in detail with the patient. CBC is stable. CMP shows elevated BUN of 30, elevated creatinine of 1.68, slightly low calcium level of 8.7, elevated Alkaline phosphate level of 203. Stable with CKD. -Recommend to start Vitamin B-12, B-complex, and Vitamin-D supplement.  -ferritin 48---continue po iron replacement with goal ferritin  of >=100 -Recommend to follow-up with PCP to see if GI symptoms and other medical concerns are stable to proceed with knee surgery.  -No lab or clinical evidence of progression of his marginal zone lymphoma at this time. -Continue f/u with Rheumatology for continued Rituxan for RA which will likely also benefit lymphoma control. -Answered all of patient's questions.   FOLLOW UP: RTC with Dr Candise Che with labs in 12 months  The total time spent in the appointment was 21 minutes* .  All of the patient's questions were answered with apparent satisfaction. The patient knows to call the clinic with any problems, questions or concerns.   Wyvonnia Lora MD MS AAHIVMS [facility] [place] Hematology/Oncology Physician [facility]  .*Total Encounter Time as defined by the Centers for Medicare and Medicaid Services includes, in addition to the face-to-face time of a patient visit (documented in the note above) non-face-to-face time: obtaining and reviewing outside history, ordering and reviewing medications, tests or procedures, care coordination (communications with other health care professionals or caregivers) and documentation in the medical record.   I,Param Shah,acting as a Neurosurgeon for Wyvonnia Lora, MD.,have documented all relevant documentation on the behalf of Wyvonnia Lora, MD,as directed by  Wyvonnia Lora, MD while in the presence of Wyvonnia Lora, MD.   .I have reviewed the above documentation for accuracy and completeness, and I agree with the above. Johney Maine MD

## 2024-01-23 ENCOUNTER — Telehealth: Payer: Self-pay | Admitting: Hematology

## 2024-01-23 NOTE — Telephone Encounter (Signed)
 Spoke with patient confirming upcoming appointment

## 2024-01-26 NOTE — Progress Notes (Incomplete)
 HEMATOLOGY ONCOLOGY CLINIC NOTE  Date of Service: 01/22/24    Patient Care Team: Raymon Mutton., FNP as PCP - General (Family Medicine) Croitoru, Rachelle Hora, MD as PCP - Cardiology (Cardiology) Tarri Glenn, Johnathan Hausen, PA-C as Physician Assistant (Emergency Medicine) Ranee Gosselin, MD as Consulting Physician (Orthopedic Surgery) Johney Maine, MD as Consulting Physician (Oncology) Graylin Shiver, MD as Consulting Physician (Otolaryngology) Felecia Shelling, DPM as Consulting Physician (Podiatry) Hunsucker, Lesia Sago, MD as Consulting Physician (Pulmonary Disease)  CHIEF COMPLAINTS F/u for  Marginal Zone lymphoma   HISTORY OF PRESENTING ILLNESS:   Bryan [name] 78 y.o. male is here because of a referral from ENT Dr. Billy Fischer regarding a concern of MALT lymphoma.   He is accompanied today by 4 members of his family. The pt reports that he is doing well overall. The pt's PCP is Dr. Clelia Croft. The pt sees Dr. Azucena Fallen for his Rheumatology. The pt went off of humira a month before his mass appeared two months. The pt takes Methotrexate 15 mg once each week for his rheumatoid arthritis. He notes that he took humira for 8-10 months, and hasn't taken it in about 3 months. He began taking prednisone 15mg  q day, 2 months ago.   He reports having high blood pressure and takes amlodipine, metoprolol for it. He reports having had back and neck surgery for discs.  He also notes having essential tremors in his hands for which he takes primidone.  He reports having run out of flomax. He has had some PTSD from his time in the [place] war.  He notes he has had drenching night sweats for the last 15 years that are of intermittent frequency. He denies any other medical issues.    He first noticed swelling of his left cheek about two months ago. He notes that it was "hard like a rock." He was placed on prednisone for about a month after going to the doctor, with some relief. He then  had his needle Bx recorded below.   He reports gum pain that began this morning, and also reports that his tongue feels as if it has bumps on it. He reports taking folic acid 1mg  each day.    On 10/29/17 the pt had a neck CT revealing Multifocal left parotid lesion. Multiple ill-defined enhancing nodules in the left parotid gland. Asymmetric enhancing lymph nodes in the left neck are not pathologically enlarged however given the asymmetry and the left parotid lesion, they could represent neoplastic spread of parotid malignancy or lymphoma. Tissue sampling recommended.  Of note prior to the patient's visit, pt has had a biopsy of his left parotid gland completed on 12/03/17 with results revealing Atypical lymphoid proliferation. 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 -   B cell clonality study was positive for clonality.  On review of systems, pt reports gum pain, fatigue (not recent), occasional night sweats, ankle pain, and denies abdominal pains, back pain, flank pain, leg swelling, swollen or painful joints beside ankles.   On PMHx the pt has had rheumatoid arthritis for 6 years and HTN. He notes that he has anemia and is a Public librarian Witness I- which he notes means he would not want to consider blood products  On Surgical Hx the pt had back surgery. On Social Hx the pt quit smoking in 1976 after smoking about 8 cigarettes  each day for 15 years. He notes drinking ETOH about once a week. He denies chemical or radiation exposure, except for agent orange.  On Family Hx the pt notes high blood pressure, DM, but denies autoimmune conditions, cancers, or blood disorders.    INTERVAL HISTORY:   Bryan [name] is here for a scheduled follow-up of his marginal zone lymphoma.   Patient was last seen by me on 10/04/2022 and he does complain of back pain and mild  bilateral leg swelling.   Patient notes he has been doing well overall since our last visit. He denies any new infection issues, fever, chills, night sweats, unexpected weight loss, back pain, chest pain, abdominal pain. He does complain of mld bilateral leg swelling and mild occasional acid reflux. He has been prescribed medication for his leg swelling   He notes that his rheumatoid arthritis is the same since the last visit. He is still on Rituxan maintenance.   Patient notes he recently had Prostate MRI, awaiting result. He continues to follow-up with his Urologist. He denies any new urinary symptoms. He does report increased bowel movement after eating meal. He has been referred to Gastroenterologist.   Patient notes he has discontinued Gabapentin since it was causing him brain fogs and mild memory loss.   He denies taking Vitamin B-12 or B-complex supplements.   Patient notes he is planning to have knee surgery on his right knee.   REVIEW OF SYSTEMS:   .10 Point review of Systems was done is negative except as noted above.  MEDICAL HISTORY:  Past Medical History:  Diagnosis Date  . Anemia   . Arthritis    hands, knees, cervical area. Back pain. Rheumatoid arthritis- weekly injections.  [name] BPH (benign prostatic hypertrophy)   . Cancer (HCC)   . Diabetes (HCC)   . Essential tremor   . GAD (generalized anxiety disorder) 12/05/2021  . GERD (gastroesophageal reflux disease)    reports for indigestion he uses mustard   . Hearing deficit    wears hearing aids bilateral  . HTN (hypertension)   . Obesity   . OSA (obstructive sleep apnea)   . PAF (paroxysmal atrial fibrillation) (HCC)   . Refusal of blood transfusions as patient is Jehovah's Witness   . Rheumatoid arthritis (HCC)   . Right bundle branch block    history of  . Seizures (HCC)    AS A CHILD. only esential tremors now.  . Sleep apnea    cpap - settings at 9 per patient   . Ulcer   JEHOVA's WITNESS  SURGICAL  HISTORY: Past Surgical History:  Procedure Laterality Date  . ABDOMINAL EXPOSURE N/A 07/30/2022   Procedure: ABDOMINAL EXPOSURE;  Surgeon: Cephus Shelling, MD;  Location: [facility] OR;  Service: Vascular;  Laterality: N/A;  . ANTERIOR CERVICAL DECOMP/DISCECTOMY FUSION N/A 03/30/2013   Procedure: ANTERIOR CERVICAL DECOMPRESSION/DISCECTOMY FUSION 2 LEVELS;  Surgeon: Clydene Fake, MD;  Location: [facility];  Service: Neurosurgery;  Laterality: N/A;  C4-5 C5-6 Anterior cervical decompression/diskectomy/fusion/Allograft/Plate  . ANTERIOR LUMBAR FUSION N/A 07/30/2022   Procedure: Anterior Lumbar  Interbody Fusion  - Lumbar five-sacral one posterior augmentation with iliac screws and O- arm;  Surgeon: Donalee Citrin, MD;  Location: [facility] OR;  Service: Neurosurgery;  Laterality: N/A;  . CHOLECYSTECTOMY N/A 12/07/2013   Procedure: LAPAROSCOPIC CHOLECYSTECTOMY WITH INTRAOPERATIVE CHOLANGIOGRAM;  Surgeon: Ardeth Sportsman, MD;  Location: [facility] ORS;  Service: General;  Laterality: N/A;  . COLONOSCOPY    . DECOMPRESSIVE LUMBAR LAMINECTOMY LEVEL  2 N/A 02/15/2015   Procedure: COMPLETE DECOMPRESSIVE LUMBAR LAMINECTOMY L4-L5/ FORAMINOTOMY TO L4 NERVE ROOT AND L5 NERVE ROOT BILATERALLY;  Surgeon: Ranee Gosselin, MD;  Location: [facility] ORS;  Service: Orthopedics;  Laterality: N/A;  . EYE SURGERY     right, growth excision  . LAMINECTOMY WITH POSTERIOR LATERAL ARTHRODESIS LEVEL 1 N/A 07/30/2022   Procedure: LAMINECTOMY WITH POSTERIOR LATERAL ARTHRODESIS LUMBAR FIVE SACRAL ONE;  Surgeon: Donalee Citrin, MD;  Location: [facility] OR;  Service: Neurosurgery;  Laterality: N/A;  . LUMBAR LAMINECTOMY/DECOMPRESSION MICRODISCECTOMY Left 01/18/2016   Procedure:  DECOMPRESSION L4-L5 MICRODISCECTOMY L4-L5 ON LEFT FOR SPINAL STENOSIS;  Surgeon: Ranee Gosselin, MD;  Location: [facility] ORS;  Service: Orthopedics;  Laterality: Left;  . PAROTIDECTOMY Left 01/24/2018   Procedure: INCISIONAL BIOPSY OF LEFT PAROTID    MASS;  Surgeon: Graylin Shiver, MD;  Location[facility]  OR;  Service: ENT;  Laterality: Left;  . SPINE SURGERY    . TONSILLECTOMY    . VASECTOMY    . WRIST GANGLION EXCISION Left     SOCIAL HISTORY: Social History   Socioeconomic History  . Marital status: Married    Spouse name: Not on file  . Number of children: 2  . Years of education: Not on file  . Highest education level: Not on file  Occupational History  . Occupation: bus Air traffic controller: RETIRED  Tobacco Use  . Smoking status: Former    Current packs/day: 0.00    Average packs/day: 0.5 packs/day for 14.0 years (7.0 ttl pk-yrs)    Types: Cigarettes    Start date: 12/14/1958    Quit date: 12/14/1972    Years since quitting: 51.1  . Smokeless tobacco: Never  Vaping Use  . Vaping status: Never Used  Substance and Sexual Activity  . Alcohol use: Yes    Comment: occasionally  . Drug use: No  . Sexual activity: Yes    Birth control/protection: None  Other Topics Concern  . Not on file  Social History Narrative   Lives with wife   Social Drivers of Health   Financial Resource Strain: Low Risk  (08/02/2022)   Overall Financial Resource Strain (CARDIA)   . Difficulty of Paying Living Expenses: Not hard at all  Food Insecurity: No Food Insecurity (10/10/2023)   Hunger Vital Sign   . Worried About Programme researcher, broadcasting/film/video in the Last Year: Never true   . Ran Out of Food in the Last Year: Never true  Transportation Needs: No Transportation Needs (10/10/2023)   PRAPARE - Transportation   . Lack of Transportation (Medical): No   . Lack of Transportation (Non-Medical): No  Physical Activity: Not on file  Stress: No Stress Concern Present (12/07/2021)   Received from [facility], [facility] - Occupational Stress Questionnaire   . Feeling of Stress : Not at all  Social Connections: Unknown (03/23/2022)   Received from [facility], [facility]   Social Network   . Social Network: Not on file  Intimate Partner Violence: Not At  Risk (10/10/2023)   Humiliation, Afraid, Rape, and Kick questionnaire   . Fear of Current or Ex-Partner: No   . Emotionally Abused: No   . Physically Abused: No   . Sexually Abused: No    FAMILY HISTORY: Family History  Problem Relation Age of Onset  . Colon cancer Maternal Grandfather   . Kidney disease Brother   . Hypertension Brother   . Diabetes Brother   .  Stroke Maternal Grandmother   . Diabetes Mother   . Hypertension Mother   . CAD Father        died of MI at age 72  . Hypertension Father   . Diabetes Paternal Aunt        x 4 aunts    ALLERGIES:  is allergic to terazosin, adalimumab, gabapentin, statins, ampicillin, lisinopril, and other.  MEDICATIONS:  Current Outpatient Medications  Medication Sig Dispense Refill  . acetaminophen (TYLENOL) 325 MG tablet Take 1-2 tablets (325-650 mg total) by mouth every 4 (four) hours as needed for mild pain.    [name] amLODipine (NORVASC) 5 MG tablet Take 5 mg by mouth daily.    [name] edoxaban (SAVAYSA) 30 MG TABS tablet Take 30 mg by mouth daily.    [name] HYDROcodone-acetaminophen (NORCO/VICODIN) 5-325 MG tablet Take 1 tablet by mouth every 4 (four) hours as needed. 6 tablet 0  . metoprolol (TOPROL-XL) 200 MG 24 hr tablet Take 100 mg by mouth daily.    . Multiple Vitamin (MULTIVITAMIN WITH MINERALS) TABS tablet Take 1 tablet by mouth daily.    . nitroGLYCERIN (NITROSTAT) 0.4 MG SL tablet Place 0.4 mg under the tongue every 5 (five) minutes as needed for chest pain. (Patient not taking: Reported on 10/10/2023)    . ondansetron (ZOFRAN) 4 MG tablet Take 1 tablet (4 mg total) by mouth every 8 (eight) hours as needed for nausea or vomiting. 20 tablet 0  . polyethylene glycol (MIRALAX / GLYCOLAX) 17 g packet Take 17 g by mouth daily as needed for mild constipation.    . primidone (MYSOLINE) 50 MG tablet Take 100 mg by mouth 2 (two) times daily.    . riTUXimab (RITUXAN) 500 MG/50ML injection Inject into the vein See admin instructions. Unknown  dosage, unconfirmed frequency. See notes.    . Tamsulosin HCl (FLOMAX) 0.4 MG CAPS Take 0.8 mg by mouth at bedtime.     No current facility-administered medications for this visit.    PHYSICAL EXAMINATION: ECOG FS:1 - Symptomatic but completely ambulatory  Vitals:   01/22/24 1023  BP: 138/62  Pulse: 73  Resp: 16  Temp: 97.6 F (36.4 C)  SpO2: 99%     Wt Readings from Last 3 Encounters:  10/10/23 220 lb (99.8 kg)  04/24/23 215 lb (97.5 kg)  09/26/22 216 lb 9.6 oz (98.2 kg)   Body mass index is 36.53 kg/m.    [name] GENERAL:alert, in no acute distress and comfortable SKIN: no acute rashes, no significant lesions EYES: conjunctiva are pink and non-injected, sclera anicteric OROPHARYNX: MMM, no exudates, no oropharyngeal erythema or ulceration NECK: supple, no JVD LYMPH:  no palpable lymphadenopathy in the cervical, axillary or inguinal regions LUNGS: clear to auscultation b/l with normal respiratory effort HEART: regular rate & rhythm ABDOMEN:  normoactive bowel sounds , non tender, not distended. Extremity: no pedal edema PSYCH: alert & oriented x 3 with fluent speech NEURO: no focal motor/sensory deficits   LABS .    Latest Ref Rng & Units 01/22/2024    9:42 AM 10/11/2023    4:53 AM 10/10/2023   11:10 AM  CBC  WBC 4.0 - 10.5 K/uL 4.3  7.6  7.4   Hemoglobin 13.0 - 17.0 g/dL 19.1  47.8  29.5   Hematocrit 39.0 - 52.0 % 39.6  34.1  37.5   Platelets 150 - 400 K/uL 231  191  206     CBC    Component Value Date/Time   WBC 7.6 10/11/2023  0453   RBC 3.54 (L) 10/11/2023 0453   HGB 10.8 (L) 10/11/2023 0453   HGB 10.9 (L) 09/26/2022 1337   HCT 34.1 (L) 10/11/2023 0453   HCT 42.7 12/19/2017 1416   PLT 191 10/11/2023 0453   PLT 238 09/26/2022 1337   MCV 96.3 10/11/2023 0453   MCV 94.0 05/09/2018 1805   MCH 30.5 10/11/2023 0453   MCHC 31.7 10/11/2023 0453   RDW 14.1 10/11/2023 0453   LYMPHSABS 1.1 09/26/2022 1337   MONOABS 0.6 09/26/2022 1337   EOSABS 0.1  09/26/2022 1337   BASOSABS 0.0 09/26/2022 1337    CMP Latest Ref Rng & Units 09/27/2021 09/26/2021  Glucose 70 - 99 mg/dL 76 97  BUN 8 - 23 mg/dL 21 22  Creatinine 0.62 - 1.24 mg/dL 3.76(E) 8.31(D)  Sodium 135 - 145 mmol/L 137 140  Potassium 3.5 - 5.1 mmol/L 4.4 3.7  Chloride 98 - 111 mmol/L 99 103  CO2 22 - 32 mmol/L 30 27  Calcium 8.9 - 10.3 mg/dL 8.3(L) 8.5(L)  Total Protein 6.5 - 8.1 g/dL 6.7 7.5  Total Bilirubin 0.3 - 1.2 mg/dL 0.9 <1.7(O)  Alkaline Phos 38 - 126 U/L 164(H) 184(H)  AST 15 - 41 U/L 25 19  ALT 0 - 44 U/L 19 16    LABORATORY DATA:       01/27/18 Flow Cytometry:   01/27/18 Parotid Gland Surgical Pathology   RADIOGRAPHIC STUDIES: I have personally reviewed the radiological images as listed and agreed with the findings in the report.  ASSESSMENT & PLAN:  Bryan [name] is a 78 y.o. male with    1.history of stage IIE Marginal-zone lymphoma involving the left parotid gland, left cervical LN and mediastinal lymph nodes. Currently in remission Hep C neg and Hep B -He completed 4 dose Rituxan 02/07/18-02/28/18.  LDH has normalized and the Parotid mass is no longer palpable. 04/22/18 PET/CT revealed complete metabolic response to therapy. No residual hypermetabolic mass or lymphadenopathy.  He has continued to be on Rituxan every 13 weeks by his rheumatologist for management of his rheumatoid arthritis but this is probably also serving as maintenance treatment for his marginal zone lymphoma.  2. Jehovas Witness- declines any use of blood products.  3. Rheumatoid Arthritis  Rituxan seems to have helped his RA as well. - off  MTX and prednisone and is currently on plaquenil per his rheumatologist, Dr. Dierdre Forth and PA Azucena Fallen.  -I encouraged him to be more active with at least walking and possibly start PT or Silver Sneakers program.  4. Low B12 levels B12 level still low at 209 -Increase to B12 SL SL daily  5.  Mild iron deficiency Lab  Results  Component Value Date   FERRITIN 18 (L) 09/26/2022    PLAN: -Discussed lab results from today, 02/04/2024, in detail with the patient. CBC is stable. CMP shows elevated BUN of 30, elevated creatinine of 1.68, slightly low calcium level of 8.7, elevated Alkaline phosphate level of 203. Stable with CKD. -Recommend to start Vitamin B-12, B-complex, and Vitamin-D supplement.  -Recommend to follow-up with PCP to see if GI symptoms and other medical concerns are stable to proceed with knee surgery.  -No lab or clinical evidence of progression of his marginal zone lymphoma at this time. -Continue f/u with Rheumatology for continued Rituxan for RA which will likely also benefit lymphoma control. -Answered all of patient's questions.   FOLLOW UP: RTC with Dr Candise Che with labs in 12 months  The total  time spent in the appointment was *** minutes* .  All of the patient's questions were answered with apparent satisfaction. The patient knows to call the clinic with any problems, questions or concerns.   Wyvonnia Lora MD MS AAHIVMS [facility] Hematology/Oncology Physician [facility]  .*Total Encounter Time as defined by the Centers for Medicare and Medicaid Services includes, in addition to the face-to-face time of a patient visit (documented in the note above) non-face-to-face time: obtaining and reviewing outside history, ordering and reviewing medications, tests or procedures, care coordination (communications with other health care professionals or caregivers) and documentation in the medical record.   I,Param Shah,acting as a Neurosurgeon for Wyvonnia Lora, MD.,have documented all relevant documentation on the behalf of Wyvonnia Lora, MD,as directed by  Wyvonnia Lora, MD while in the presence of Wyvonnia Lora, MD.

## 2024-02-03 ENCOUNTER — Ambulatory Visit: Payer: Medicare Other | Admitting: Physical Therapy

## 2024-02-10 ENCOUNTER — Ambulatory Visit: Payer: Medicare Other | Admitting: Physical Therapy

## 2024-03-16 ENCOUNTER — Inpatient Hospital Stay
Admission: RE | Admit: 2024-03-16 | Discharge: 2024-03-16 | Disposition: A | Discharge status: Home / Self Care | Payer: Self-pay | Source: Ambulatory Visit | Attending: Radiation Oncology | Admitting: Radiation Oncology

## 2024-03-16 ENCOUNTER — Telehealth: Payer: Self-pay | Admitting: Radiation Oncology

## 2024-03-16 ENCOUNTER — Other Ambulatory Visit: Payer: Self-pay | Admitting: Radiation Oncology

## 2024-03-16 DIAGNOSIS — C61 Malignant neoplasm of prostate: Secondary | ICD-10-CM

## 2024-03-16 NOTE — Telephone Encounter (Signed)
 5/5 Received referral, According to C. Bronson, [place] notes, PET scan pending.  Also received call from patient ready to be schedule.  He is aware we are waiting for his PET to be schedule and/or received.  Called [facility] Loetta Ringer) no orders in for PET scan at this time.  Follow up call/Left voicemail with Lorette Ronco, Ref Coord for [facility], if PET scan being done through [place] or [facility], waiting on call back to confirm.

## 2024-03-18 ENCOUNTER — Other Ambulatory Visit (HOSPITAL_COMMUNITY): Payer: Self-pay | Admitting: Student

## 2024-03-18 ENCOUNTER — Telehealth: Payer: Self-pay | Admitting: Radiation Oncology

## 2024-03-18 DIAGNOSIS — C61 Malignant neoplasm of prostate: Secondary | ICD-10-CM

## 2024-03-18 NOTE — Telephone Encounter (Addendum)
 5/5 sent via stat fax requested for mri images to be push to powershare from [facility].  5/7 Left voicemail with [facility] for mri images to be uploaded to PACs in epic.  Waiting on images.  5/19 Follow up call to [facility] spoke to [facility], working on it/should see images soon.  Waiting on images.

## 2024-03-18 NOTE — Telephone Encounter (Signed)
 5/7 Received voicemail from [place], ref coord for [facility], orders are in for PET scan.  Sent inbasket to Verlena Glenn, Statistician.  Waiting on PET scan to be schedule before reach out to patient.

## 2024-03-23 NOTE — Progress Notes (Incomplete)
 GU Location of Tumor / Histology: Prostate Ca  If Prostate Cancer, Gleason Score is (4 + 4) and PSA is (  )  Bryan [name] presented as referral from Dr. Melanee Spire  for elevated PSA.  Biopsies    Past/Anticipated interventions by urology, if any: NA  Past/Anticipated interventions by medical oncology, if any: NA  Weight changes, if any:   IPSS: SHIM:  Bowel/Bladder complaints, if any: {:18581}   Nausea/Vomiting, if any: {:18581}  Pain issues, if any:  {:18581}  SAFETY ISSUES: Prior radiation? {:18581} Pacemaker/ICD? {:18581} Possible current pregnancy? Male Is the patient on methotrexate ? No  Current Complaints / other details:

## 2024-03-27 ENCOUNTER — Encounter (HOSPITAL_COMMUNITY)
Admission: RE | Admit: 2024-03-27 | Discharge: 2024-03-27 | Disposition: A | Discharge status: Home / Self Care | Source: Ambulatory Visit | Attending: Student | Admitting: Student

## 2024-03-27 DIAGNOSIS — C61 Malignant neoplasm of prostate: Secondary | ICD-10-CM | POA: Insufficient documentation

## 2024-03-27 MED ORDER — FLOTUFOLASTAT F 18 GALLIUM 296-5846 MBQ/ML IV SOLN
7.3770 | Freq: Once | INTRAVENOUS | Status: AC
  Administered 2024-03-27: 7.377 via INTRAVENOUS

## 2024-03-30 NOTE — Progress Notes (Incomplete)
 Radiation Oncology         [phone] ________________________________  Initial Outpatient Consultation  Name: Bryan [name] MRN: [id number]  Date: 03/31/2024  DOB: 02-15-1946  WU:[id number], Leata Providence., FNP  Melanee Spire, DO   REFERRING PHYSICIAN: Melanee Spire, DO  DIAGNOSIS: 78 y.o. gentleman with Stage T*** adenocarcinoma of the prostate with Gleason score of 4+4, and PSA of ***.  No diagnosis found.  HISTORY OF PRESENT ILLNESS: Bryan [name] is a 78 y.o. male with a diagnosis of prostate cancer. 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 He was treated with Rituxan  in 02/2018 and has since been in remission. He is followed by Dr. Salomon Cree for surveillance, and he also continues to receive Rituxan  every 13 weeks under his rheumatologist for management of rheumatoid arthritis.   He was initially referred for evaluation in urology at the [facility] back in 08/2023 for recurrent UTI's. During ED visit on 10/10/23 for extensive diverticulitis, a CT A/P showed a potentially enhancing nodule along left ide of prostate. Of note, his PSA was previously elevated back in 09/2020, at which time it was 5.01.  On repeat, it was ***. He underwent prostate MRI on 01/13/24 showing: PI-RADS 5 lesion in left anterior transition zone at apex with extracapsular extension of tumor; no adenopathy. The patient proceeded to transrectal ultrasound with 12 biopsies of the prostate on 03/06/24 under Katina Parlor, PA at the [facility].  The prostate volume measured *** cc.  Out of 13 core biopsies, 2 were positive.  The maximum Gleason score was 4+4, and this was seen in 5% of biopsy sample from left side. Of note, all remaining cores were benign.  He underwent staging PSMA PET scan on 03/27/24 showing no evidence of disease outside of the prostate. Incidentally noted was a small area of uptake along left lumbar spine associated with  significant hardware and degenerative changes.  The patient reviewed the biopsy results with his urologist and he has kindly been referred today for discussion of potential radiation treatment options.   PREVIOUS RADIATION THERAPY: No  PAST MEDICAL HISTORY:  Past Medical History:  Diagnosis Date   Anemia    Arthritis    hands, knees, cervical area. Back pain. Rheumatoid arthritis- weekly injections.   BPH (benign prostatic hypertrophy)    Cancer (HCC)    Diabetes (HCC)    Essential tremor    GAD (generalized anxiety disorder) 12/05/2021   GERD (gastroesophageal reflux disease)    reports for indigestion he uses mustard    Hearing deficit    wears hearing aids bilateral   HTN (hypertension)    Obesity    OSA (obstructive sleep apnea)    PAF (paroxysmal atrial fibrillation) (HCC)    Refusal of blood transfusions as patient is Jehovah's Witness    Rheumatoid arthritis (HCC)    Right bundle branch block    history of   Seizures (HCC)    AS A CHILD. only esential tremors now.   Sleep apnea    cpap - settings at 9 per patient    Ulcer       PAST SURGICAL HISTORY: Past Surgical History:  Procedure Laterality Date   ABDOMINAL EXPOSURE N/A 07/30/2022   Procedure: ABDOMINAL EXPOSURE;  Surgeon: Young Hensen, MD;  Location: [facility] OR;  Service: Vascular;  Laterality: N/A;   ANTERIOR CERVICAL DECOMP/DISCECTOMY FUSION N/A 03/30/2013   Procedure: ANTERIOR CERVICAL DECOMPRESSION/DISCECTOMY FUSION 2  LEVELS;  Surgeon: Shary Deems, MD;  Location: [facility] ORS;  Service: Neurosurgery;  Laterality: N/A;  C4-5 C5-6 Anterior cervical decompression/diskectomy/fusion/Allograft/Plate   ANTERIOR LUMBAR FUSION N/A 07/30/2022   Procedure: Anterior Lumbar  Interbody Fusion  - Lumbar five-sacral one posterior augmentation with iliac screws and O- arm;  Surgeon: Gearl Keens, MD;  Location: [facility] OR;  Service: Neurosurgery;  Laterality: N/A;   CHOLECYSTECTOMY N/A 12/07/2013   Procedure: LAPAROSCOPIC  CHOLECYSTECTOMY WITH INTRAOPERATIVE CHOLANGIOGRAM;  Surgeon: Eddye Goodie, MD;  Location: [facility] ORS;  Service: General;  Laterality: N/A;   COLONOSCOPY     DECOMPRESSIVE LUMBAR LAMINECTOMY LEVEL 2 N/A 02/15/2015   Procedure: COMPLETE DECOMPRESSIVE LUMBAR LAMINECTOMY L4-L5/ FORAMINOTOMY TO L4 NERVE ROOT AND L5 NERVE ROOT BILATERALLY;  Surgeon: Hazle Lites, MD;  Location: [facility] ORS;  Service: Orthopedics;  Laterality: N/A;   EYE SURGERY     right, growth excision   LAMINECTOMY WITH POSTERIOR LATERAL ARTHRODESIS LEVEL 1 N/A 07/30/2022   Procedure: LAMINECTOMY WITH POSTERIOR LATERAL ARTHRODESIS LUMBAR FIVE SACRAL ONE;  Surgeon: Gearl Keens, MD;  Location: [facility] OR;  Service: Neurosurgery;  Laterality: N/A;   LUMBAR LAMINECTOMY/DECOMPRESSION MICRODISCECTOMY Left 01/18/2016   Procedure:  DECOMPRESSION L4-L5 MICRODISCECTOMY L4-L5 ON LEFT FOR SPINAL STENOSIS;  Surgeon: Hazle Lites, MD;  Location: [facility] ORS;  Service: Orthopedics;  Laterality: Left;   PAROTIDECTOMY Left 01/24/2018   Procedure: INCISIONAL BIOPSY OF LEFT PAROTID    MASS;  Surgeon: Eldon Greenland, MD;  Location: [facility] OR;  Service: ENT;  Laterality: Left;   SPINE SURGERY     TONSILLECTOMY     VASECTOMY     WRIST GANGLION EXCISION Left     FAMILY HISTORY:  Family History  Problem Relation Age of Onset   Colon cancer Maternal Grandfather    Kidney disease Brother    Hypertension Brother    Diabetes Brother    Stroke Maternal Grandmother    Diabetes Mother    Hypertension Mother    CAD Father        died of MI at age 19   Hypertension Father    Diabetes Paternal Aunt        x 4 aunts    SOCIAL HISTORY:  Social History   Socioeconomic History   Marital status: Married    Spouse name: Not on file   Number of children: 2   Years of education: Not on file   Highest education level: Not on file  Occupational History   Occupation: bus driver    Employer: RETIRED  Tobacco Use   Smoking status: Former    Current packs/day: 0.00     Average packs/day: 0.5 packs/day for 14.0 years (7.0 ttl pk-yrs)    Types: Cigarettes    Start date: 12/14/1958    Quit date: 12/14/1972    Years since quitting: 51.3   Smokeless tobacco: Never  Vaping Use   Vaping status: Never Used  Substance and Sexual Activity   Alcohol use: Yes    Comment: occasionally   Drug use: No   Sexual activity: Yes    Birth control/protection: None  Other Topics Concern   Not on file  Social History Narrative   Lives with wife   Social Drivers of Health   Financial Resource Strain: Low Risk  (08/02/2022)   Overall Financial Resource Strain (CARDIA)    Difficulty of Paying Living Expenses: Not hard at all  Food Insecurity: No Food Insecurity (10/10/2023)   Hunger Vital Sign    Worried About  Running Out of Food in the Last Year: Never true    Ran Out of Food in the Last Year: Never true  Transportation Needs: No Transportation Needs (10/10/2023)   PRAPARE - Administrator, Civil Service (Medical): No    Lack of Transportation (Non-Medical): No  Physical Activity: Not on file  Stress: No Stress Concern Present (12/07/2021)   Received from [facility], [facility] of Occupational Health - Occupational Stress Questionnaire    Feeling of Stress : Not at all  Social Connections: Unknown (03/23/2022)   Received from [facility], [facility]   Social Network    Social Network: Not on file  Intimate Partner Violence: Not At Risk (10/10/2023)   Humiliation, Afraid, Rape, and Kick questionnaire    Fear of Current or Ex-Partner: No    Emotionally Abused: No    Physically Abused: No    Sexually Abused: No    ALLERGIES: Terazosin, Adalimumab, Gabapentin , Statins, Ampicillin, Lisinopril , and Other  MEDICATIONS:  Current Outpatient Medications  Medication Sig Dispense Refill   diclofenac  Sodium (VOLTAREN ) 1 % GEL Apply 2 g topically 4 (four) times daily.     furosemide  (LASIX ) 20 MG tablet Take 20 mg by mouth 2 (two)  times daily as needed.     acetaminophen  (TYLENOL ) 325 MG tablet Take 1-2 tablets (325-650 mg total) by mouth every 4 (four) hours as needed for mild pain.     amLODipine  (NORVASC ) 5 MG tablet Take 5 mg by mouth daily.     edoxaban  (SAVAYSA ) 30 MG TABS tablet Take 30 mg by mouth daily.     HYDROcodone -acetaminophen  (NORCO/VICODIN) 5-325 MG tablet Take 1 tablet by mouth every 4 (four) hours as needed. 6 tablet 0   metoprolol  (TOPROL -XL) 200 MG 24 hr tablet Take 100 mg by mouth daily.     Multiple Vitamin (MULTIVITAMIN WITH MINERALS) TABS tablet Take 1 tablet by mouth daily.     nitroGLYCERIN  (NITROSTAT ) 0.4 MG SL tablet Place 0.4 mg under the tongue every 5 (five) minutes as needed for chest pain. (Patient not taking: Reported on 10/10/2023)     ondansetron  (ZOFRAN ) 4 MG tablet Take 1 tablet (4 mg total) by mouth every 8 (eight) hours as needed for nausea or vomiting. 20 tablet 0   polyethylene glycol (MIRALAX  / GLYCOLAX ) 17 g packet Take 17 g by mouth daily as needed for mild constipation.     primidone  (MYSOLINE ) 50 MG tablet Take 100 mg by mouth 2 (two) times daily.     riTUXimab  (RITUXAN ) 500 MG/50ML injection Inject into the vein See admin instructions. Unknown dosage, unconfirmed frequency. See notes.     Tamsulosin  HCl (FLOMAX ) 0.4 MG CAPS Take 0.8 mg by mouth at bedtime.     No current facility-administered medications for this encounter.    REVIEW OF SYSTEMS:  On review of systems, the patient reports that he is doing well overall. He denies any chest pain, shortness of breath, cough, fevers, chills, night sweats, unintended weight changes. He denies any bowel disturbances, and denies abdominal pain, nausea or vomiting. He denies any new musculoskeletal or joint aches or pains. His IPSS was ***, indicating *** urinary symptoms. His SHIM was ***, indicating he {does not have/has mild/moderate/severe} erectile dysfunction. A complete review of systems is obtained and is otherwise negative.     PHYSICAL EXAM:  Wt Readings from Last 3 Encounters:  01/22/24 219 lb 8 oz (99.6 kg)  10/10/23 220 lb (99.8 kg)  04/24/23 215 lb (97.5 kg)   Temp Readings from Last 3 Encounters:  01/22/24 97.6 F (36.4 C) (Temporal)  10/13/23 98.6 F (37 C) (Oral)  04/25/23 98.8 F (37.1 C) (Oral)   BP Readings from Last 3 Encounters:  01/22/24 138/62  10/13/23 (!) 115/97  04/25/23 (!) 158/80   Pulse Readings from Last 3 Encounters:  01/22/24 73  10/13/23 85  04/25/23 90    /10  In general this is a well appearing *** male in no acute distress. He's alert and oriented x4 and appropriate throughout the examination. Cardiopulmonary assessment is negative for acute distress, and he exhibits normal effort.     KPS = ***  100 - Normal; no complaints; no evidence of disease. 90   - Able to carry on normal activity; minor signs or symptoms of disease. 80   - Normal activity with effort; some signs or symptoms of disease. 30   - Cares for self; unable to carry on normal activity or to do active work. 60   - Requires occasional assistance, but is able to care for most of his personal needs. 50   - Requires considerable assistance and frequent medical care. 40   - Disabled; requires special care and assistance. 30   - Severely disabled; hospital admission is indicated although death not imminent. 20   - Very sick; hospital admission necessary; active supportive treatment necessary. 10   - Moribund; fatal processes progressing rapidly. 0     - Dead  Karnofsky DA, Abelmann WH, Craver LS and Burchenal [facility] [phone]) The use of the nitrogen mustards in the palliative treatment of carcinoma: with particular reference to bronchogenic carcinoma Cancer 1 634-56  LABORATORY DATA:  Lab Results  Component Value Date   WBC 4.3 01/22/2024   HGB 12.7 (L) 01/22/2024   HCT 39.6 01/22/2024   MCV 97.1 01/22/2024   PLT 231 01/22/2024   Lab Results  Component Value Date   NA 139 01/22/2024   K 4.3 01/22/2024    CL 104 01/22/2024   CO2 30 01/22/2024   Lab Results  Component Value Date   ALT 9 01/22/2024   AST 14 (L) 01/22/2024   ALKPHOS 203 (H) 01/22/2024   BILITOT 0.4 01/22/2024     RADIOGRAPHY: NM PET (PSMA) SKULL TO MID THIGH Result Date: 03/27/2024 CLINICAL DATA:  Initial treatment strategy for prostate cancer. Gleason 8. Previous history of lymphoma. Marginal B-cell EXAM: NUCLEAR MEDICINE PET SKULL BASE TO THIGH TECHNIQUE: 7.377 mCi F18 Posluma  was injected intravenously. Full-ring PET imaging was performed from the skull base to thigh after the radiotracer. CT data was obtained and used for attenuation correction and anatomic localization. COMPARISON:  Abdomen pelvis CT 10/10/2023. Head CT 01/28/2024. Prostate MRI 01/13/2024 FINDINGS: NECK No radiotracer activity in neck lymph nodes. There is physiologic uptake along salivary glands complex more glands. Neural structures. Incidental CT finding: Significant streak artifact related to the patient's dental hardware. There is also spinal fixation hardware in the cervical region. The parotid glands, submandibular glands and thyroid  gland are unremarkable. The paranasal sinuses and mastoid air cells are clear. Bilateral hearing devices. CHEST No radiotracer accumulation within mediastinal or hilar lymph nodes. No suspicious pulmonary nodules on the CT scan. Incidental CT finding: Breathing motion. No pneumothorax or effusion. There is some bandlike opacity in the posteromedial right upper lobe. Minimal uptake. Favor scar atelectasis. Similar areas in the superior segment the right lower lobe. Patulous esophagus. Heart is nonenlarged. No pericardial effusion. There is some enlargement  of the pulmonary artery. Please correlate for pulmonary artery hypertension. Coronary artery calcifications are noted ABDOMEN/PELVIS Prostate: There is a focus of significant uptake along the left side of the prostate anteriorly with maximum SUV of 43.9. Please correlate for  findings by MRI. Location of primary neoplasm. Lymph nodes: No abnormal radiotracer accumulation within pelvic or abdominal nodes. Liver: No evidence of liver metastasis. Incidental CT finding: Previous cholecystectomy. Grossly, liver, spleen, adrenal glands and pancreas are unremarkable. Diffuse breathing motion. No abnormal calcifications seen within either kidney nor along the course of either ureter. Slightly redundant course to the sigmoid colon. Left-sided scattered colonic diverticula. Few right-sided diverticula as well. Stomach and small bowel are nondilated. Diffuse vascular calcifications are seen. SKELETON There is some mild uptake seen along the lumbar spine posterior elements on the left side but hardware and degenerative changes are identified in this location no specific abnormal uptake also wise along the visualized osseous structures. IMPRESSION: Significant area of abnormal radiotracer uptake along left side of the prostate. Please correlate for location of primary neoplasm. No specific other areas of abnormal uptake to confirm metastatic disease. Small area of uptake along the left side lumbar spine along posterior elements but there is significant hardware and degenerative changes in this location. Is still findings of colonic diverticula.  Vascular calcifications. Electronically Signed   By: Adrianna Horde M.D.   On: 03/27/2024 15:21      IMPRESSION/PLAN: 1. 78 y.o. gentleman with Stage T*** adenocarcinoma of the prostate with Gleason Score of 4+4, and PSA of ***. We discussed the patient's workup and outlined the nature of prostate cancer in this setting. The patient's T stage, Gleason's score, and PSA put him into the high risk group. Accordingly, he is eligible for a variety of potential treatment options including LT-ADT concurrent with 8 weeks of external radiation, 5 weeks of external radiation with an upfront brachytherapy boost, or prostatectomy. We discussed the available radiation  techniques, and focused on the details and logistics of delivery. {The patient may not be an ideal candidate for brachytherapy/boost with a prostate volume of *** prior to downsizing from hormone therapy. We discussed that based on his prostate volume, he would require beginning treatment with a 5 alpha reductase inhibitor +/- ADT for at least 3 months to allow for downsizing of the prostate prior to initiating brachytherapy.} We discussed and outlined the risks, benefits, short and long-term effects associated with radiotherapy and compared and contrasted these with prostatectomy. We discussed the role of SpaceOAR gel in reducing the rectal toxicity associated with radiotherapy. We also detailed the role of ADT in the treatment of high risk prostate cancer and outlined the associated side effects that could be expected with this therapy. He appears to have a good understanding of his disease and our treatment recommendations which are of curative intent.  He was encouraged to ask questions that were answered to his stated satisfaction.  At the conclusion of our conversation, the patient is interested in moving forward with ***.  We personally spent *** minutes in this encounter including chart review, reviewing radiological studies, meeting face-to-face with the patient, entering orders and completing documentation.    Arta Bihari, PA-C    Kenith Payer, MD  [facility]  Radiation Oncology Direct Dial: [phone]  Fax: [phone] [email or web address]  Skype  LinkedIn   This document serves as a record of services personally performed by Kenith Payer, MD and Keitha Pata, PA-C. It was created on their behalf by Florance Hun, a  trained medical scribe. The creation of this record is based on the scribe's personal observations and the provider's statements to them. This document has been checked and approved by the attending provider.

## 2024-03-31 ENCOUNTER — Ambulatory Visit
Admission: RE | Admit: 2024-03-31 | Discharge: 2024-03-31 | Disposition: A | Discharge status: Home / Self Care | Source: Ambulatory Visit | Attending: Radiation Oncology | Admitting: Radiation Oncology

## 2024-03-31 ENCOUNTER — Encounter: Payer: Self-pay | Admitting: Radiation Oncology

## 2024-03-31 ENCOUNTER — Ambulatory Visit

## 2024-03-31 VITALS — BP 168/58 | HR 63 | Temp 97.6°F | Resp 20 | Ht 65.0 in | Wt 218.8 lb

## 2024-03-31 DIAGNOSIS — C61 Malignant neoplasm of prostate: Secondary | ICD-10-CM

## 2024-03-31 DIAGNOSIS — Z791 Long term (current) use of non-steroidal anti-inflammatories (NSAID): Secondary | ICD-10-CM | POA: Diagnosis not present

## 2024-03-31 DIAGNOSIS — M069 Rheumatoid arthritis, unspecified: Secondary | ICD-10-CM | POA: Diagnosis not present

## 2024-03-31 DIAGNOSIS — Z8572 Personal history of non-Hodgkin lymphomas: Secondary | ICD-10-CM | POA: Insufficient documentation

## 2024-03-31 DIAGNOSIS — Z800 Family history of malignant neoplasm of digestive organs: Secondary | ICD-10-CM | POA: Diagnosis not present

## 2024-03-31 DIAGNOSIS — I10 Essential (primary) hypertension: Secondary | ICD-10-CM | POA: Insufficient documentation

## 2024-03-31 DIAGNOSIS — E119 Type 2 diabetes mellitus without complications: Secondary | ICD-10-CM | POA: Insufficient documentation

## 2024-03-31 DIAGNOSIS — G4733 Obstructive sleep apnea (adult) (pediatric): Secondary | ICD-10-CM | POA: Diagnosis not present

## 2024-03-31 DIAGNOSIS — G4700 Insomnia, unspecified: Secondary | ICD-10-CM | POA: Insufficient documentation

## 2024-03-31 DIAGNOSIS — M47816 Spondylosis without myelopathy or radiculopathy, lumbar region: Secondary | ICD-10-CM | POA: Insufficient documentation

## 2024-03-31 DIAGNOSIS — N400 Enlarged prostate without lower urinary tract symptoms: Secondary | ICD-10-CM | POA: Insufficient documentation

## 2024-03-31 DIAGNOSIS — Z7901 Long term (current) use of anticoagulants: Secondary | ICD-10-CM | POA: Diagnosis not present

## 2024-03-31 DIAGNOSIS — Z87891 Personal history of nicotine dependence: Secondary | ICD-10-CM | POA: Diagnosis not present

## 2024-03-31 DIAGNOSIS — Z79899 Other long term (current) drug therapy: Secondary | ICD-10-CM | POA: Diagnosis not present

## 2024-03-31 DIAGNOSIS — K5730 Diverticulosis of large intestine without perforation or abscess without bleeding: Secondary | ICD-10-CM | POA: Diagnosis not present

## 2024-03-31 DIAGNOSIS — Z87440 Personal history of urinary (tract) infections: Secondary | ICD-10-CM | POA: Diagnosis not present

## 2024-03-31 DIAGNOSIS — Z9049 Acquired absence of other specified parts of digestive tract: Secondary | ICD-10-CM | POA: Diagnosis not present

## 2024-03-31 DIAGNOSIS — I480 Paroxysmal atrial fibrillation: Secondary | ICD-10-CM | POA: Insufficient documentation

## 2024-03-31 NOTE — Progress Notes (Signed)
 Introduced myself to the patient, and his wife, as the prostate nurse navigator. He is here to discuss his radiation treatment options and will proceed with ADT, brachy boost, and 5 weeks daily radiation. We will work with the [facility] to ensure ADT is started, and patient is scheduled to meet with Dr. Valeta Gaudier on 6/16. I gave him my business card and asked him to call me with questions or concerns.  Verbalized understanding.

## 2024-03-31 NOTE — Progress Notes (Signed)
 GU Location of Tumor / Histology: Prostate Ca  If Prostate Cancer, Gleason Score is (4 + 4) and PSA is (5.26)  Marice Shock presented as referral from Dr. Daniel Margalski  for elevated PSA.  Biopsies    03/27/2024 Dr. Daniel Margalski NM PET (PSMA) Skull to Mid Thigh CLINICAL DATA:  Initial treatment strategy for prostate cancer. Gleason 8. Previous history of lymphoma. Marginal B-cell.  IMPRESSION: Significant area of abnormal radiotracer uptake along left side of the prostate.  Please correlate for location of primary neoplasm. No specific other areas of abnormal uptake to confirm metastatic disease.  Small area of uptake along the left side lumbar spine along posterior elements but there is significant hardware and degenerative changes in this location.  Is still findings of colonic diverticula.  Vascular calcifications.    Past/Anticipated interventions by urology, if any: NA  Past/Anticipated interventions by medical oncology, if any: NA  Weight changes, if any:   No  IPSS:  11 SHIM:  16  Bowel/Bladder complaints, if any:  No  Nausea/Vomiting, if any:  No  Pain issues, if any:  Back 8/10  SAFETY ISSUES: Prior radiation?  No Pacemaker/ICD? No Possible current pregnancy? Male Is the patient on methotrexate ? No  Current Complaints / other details:

## 2024-03-31 NOTE — Progress Notes (Signed)
 Radiation Oncology         [phone] ________________________________  Initial Outpatient Consultation  Name: Bryan [name] MRN: [id number]  Date: 03/31/2024  DOB: 03/26/1946  VH:QIONG, Bryan [place]., [name]  Bryan Spire, DO   REFERRING PHYSICIAN: Melanee Spire, DO  DIAGNOSIS: 77 y.o. gentleman with Stage cT1c, radT3a adenocarcinoma of the prostate with Gleason score of 4+4, and PSA of 6.67.    ICD-10-CM   1. Malignant neoplasm of prostate (HCC)  C61       HISTORY OF PRESENT ILLNESS: Bryan [name] is a 78 y.o. male with a diagnosis of prostate cancer. 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 He was treated with Rituxan  in 02/2018 and has since been in remission. He is followed by Dr. Salomon Cree for surveillance, and he also continues to receive Rituxan  every 13 weeks under his rheumatologist for management of rheumatoid arthritis.   He was initially referred for evaluation in urology at the [facility] back in 08/2023 for recurrent UTI's. During ED visit on 10/10/23 for extensive diverticulitis, a CT A/P showed a potentially enhancing nodule along left ide of prostate.     Of note, his PSA was previously elevated back in 09/2020, at which time it was 5.01.  On repeat, it was 6.69 on 10/25/23 at [facility] through [employer]. He underwent prostate MRI on 01/13/24 showing: PI-RADS 5 lesion in left anterior transition zone at apex with extracapsular extension of tumor; no adenopathy.     The patient proceeded to transrectal ultrasound with 12 biopsies of the prostate on 03/06/24 under Katina Parlor, PA at the [facility].  The prostate volume measured 28.39 cc.  Out of 13 core biopsies, 2 were positive.  The maximum Gleason score was 4+4, and this was seen in 5% of biopsy sample from left side. Of note, all remaining cores were benign.  He underwent staging PSMA PET scan on 03/27/24 showing no  evidence of disease outside of the prostate. Incidentally noted was a small area of uptake along left lumbar spine associated with significant hardware and degenerative changes.    The patient reviewed the biopsy results with his urologist and he has kindly been referred today for discussion of potential radiation treatment options.   PREVIOUS RADIATION THERAPY: No  PAST MEDICAL HISTORY:  Past Medical History:  Diagnosis Date   Anemia    Arthritis    hands, knees, cervical area. Back pain. Rheumatoid arthritis- weekly injections.   BPH (benign prostatic hypertrophy)    Cancer (HCC)    Diabetes (HCC)    Essential tremor    GAD (generalized anxiety disorder) 12/05/2021   GERD (gastroesophageal reflux disease)    reports for indigestion he uses mustard    Hearing deficit    wears hearing aids bilateral   HTN (hypertension)    Obesity    OSA (obstructive sleep apnea)    PAF (paroxysmal atrial fibrillation) (HCC)    Refusal of blood transfusions as patient is Jehovah's Witness    Rheumatoid arthritis (HCC)    Right bundle branch block    history of   Seizures (HCC)    AS A CHILD. only esential tremors now.   Sleep apnea    cpap - settings at 9 per patient    Ulcer       PAST SURGICAL HISTORY: Past Surgical History:  Procedure Laterality Date   ABDOMINAL EXPOSURE N/A 07/30/2022  Procedure: ABDOMINAL EXPOSURE;  Surgeon: Young Hensen, MD;  Location: [facility] OR;  Service: Vascular;  Laterality: N/A;   ANTERIOR CERVICAL DECOMP/DISCECTOMY FUSION N/A 03/30/2013   Procedure: ANTERIOR CERVICAL DECOMPRESSION/DISCECTOMY FUSION 2 LEVELS;  Surgeon: Shary Deems, MD;  Location: [facility] ORS;  Service: Neurosurgery;  Laterality: N/A;  C4-5 C5-6 Anterior cervical decompression/diskectomy/fusion/Allograft/Plate   ANTERIOR LUMBAR FUSION N/A 07/30/2022   Procedure: Anterior Lumbar  Interbody Fusion  - Lumbar five-sacral one posterior augmentation with iliac screws and O- arm;  Surgeon:  Gearl Keens, MD;  Location: [facility] OR;  Service: Neurosurgery;  Laterality: N/A;   CHOLECYSTECTOMY N/A 12/07/2013   Procedure: LAPAROSCOPIC CHOLECYSTECTOMY WITH INTRAOPERATIVE CHOLANGIOGRAM;  Surgeon: Eddye Goodie, MD;  Location: [facility] ORS;  Service: General;  Laterality: N/A;   COLONOSCOPY     DECOMPRESSIVE LUMBAR LAMINECTOMY LEVEL 2 N/A 02/15/2015   Procedure: COMPLETE DECOMPRESSIVE LUMBAR LAMINECTOMY L4-L5/ FORAMINOTOMY TO L4 NERVE ROOT AND L5 NERVE ROOT BILATERALLY;  Surgeon: Hazle Lites, MD;  Location: [facility] ORS;  Service: Orthopedics;  Laterality: N/A;   EYE SURGERY     right, growth excision   LAMINECTOMY WITH POSTERIOR LATERAL ARTHRODESIS LEVEL 1 N/A 07/30/2022   Procedure: LAMINECTOMY WITH POSTERIOR LATERAL ARTHRODESIS LUMBAR FIVE SACRAL ONE;  Surgeon: Gearl Keens, MD;  Location: [facility] OR;  Service: Neurosurgery;  Laterality: N/A;   LUMBAR LAMINECTOMY/DECOMPRESSION MICRODISCECTOMY Left 01/18/2016   Procedure:  DECOMPRESSION L4-L5 MICRODISCECTOMY L4-L5 ON LEFT FOR SPINAL STENOSIS;  Surgeon: Hazle Lites, MD;  Location: [facility] ORS;  Service: Orthopedics;  Laterality: Left;   PAROTIDECTOMY Left 01/24/2018   Procedure: INCISIONAL BIOPSY OF LEFT PAROTID    MASS;  Surgeon: Eldon Greenland, MD;  Location: [facility] OR;  Service: ENT;  Laterality: Left;   SPINE SURGERY     TONSILLECTOMY     VASECTOMY     WRIST GANGLION EXCISION Left     FAMILY HISTORY:  Family History  Problem Relation Age of Onset   Colon cancer Maternal Grandfather    Kidney disease Brother    Hypertension Brother    Diabetes Brother    Stroke Maternal Grandmother    Diabetes Mother    Hypertension Mother    CAD Father        died of MI at age 70   Hypertension Father    Diabetes Paternal Aunt        x 4 aunts    SOCIAL HISTORY:  Social History   Socioeconomic History   Marital status: Married    Spouse name: Not on file   Number of children: 2   Years of education: Not on file   Highest education level: Not on file   Occupational History   Occupation: bus driver    Employer: RETIRED  Tobacco Use   Smoking status: Former    Current packs/day: 0.00    Average packs/day: 0.5 packs/day for 14.0 years (7.0 ttl pk-yrs)    Types: Cigarettes    Start date: 12/14/1958    Quit date: 12/14/1972    Years since quitting: 51.3   Smokeless tobacco: Never  Vaping Use   Vaping status: Never Used  Substance and Sexual Activity   Alcohol use: Yes    Comment: occasionally   Drug use: No   Sexual activity: Yes    Birth control/protection: None  Other Topics Concern   Not on file  Social History Narrative   Lives with wife   Social Drivers of Health   Financial Resource Strain: Low Risk  (08/02/2022)  Overall Financial Resource Strain (CARDIA)    Difficulty of Paying Living Expenses: Not hard at all  Food Insecurity: No Food Insecurity (03/31/2024)   Hunger Vital Sign    Worried About Running Out of Food in the Last Year: Never true    Ran Out of Food in the Last Year: Never true  Transportation Needs: No Transportation Needs (03/31/2024)   PRAPARE - Administrator, Civil Service (Medical): No    Lack of Transportation (Non-Medical): No  Physical Activity: Not on file  Stress: No Stress Concern Present (12/07/2021)   Received from [facility], [facility] of Occupational Health - Occupational Stress Questionnaire    Feeling of Stress : Not at all  Social Connections: Unknown (03/23/2022)   [facility], [facility]   Social Network    Social Network: Not on file  Intimate Partner Violence: Not At Risk (03/31/2024)   Humiliation, Afraid, Rape, and Kick questionnaire    Fear of Current or Ex-Partner: No    Emotionally Abused: No    Physically Abused: No    Sexually Abused: No    ALLERGIES: Terazosin, Adalimumab, Gabapentin , Statins, Ampicillin, Lisinopril , and Other  MEDICATIONS:  Current Outpatient Medications  Medication Sig Dispense Refill    amLODipine  (NORVASC ) 5 MG tablet Take 5 mg by mouth daily.     furosemide  (LASIX ) 20 MG tablet Take 20 mg by mouth 2 (two) times daily as needed.     metoprolol  (TOPROL -XL) 200 MG 24 hr tablet Take 100 mg by mouth daily.     primidone  (MYSOLINE ) 50 MG tablet Take 100 mg by mouth 2 (two) times daily.     senna-docusate (SENOKOT-S) 8.6-50 MG tablet Take 1 tablet by mouth as needed.     Tamsulosin  HCl (FLOMAX ) 0.4 MG CAPS Take 0.8 mg by mouth at bedtime.     acetaminophen  (TYLENOL ) 325 MG tablet Take 1-2 tablets (325-650 mg total) by mouth every 4 (four) hours as needed for mild pain.     atorvastatin  (LIPITOR) 20 MG tablet Take 20 mg by mouth daily. (Patient not taking: Reported on 03/31/2024)     diclofenac  Sodium (VOLTAREN ) 1 % GEL Apply 2 g topically 4 (four) times daily.     edoxaban  (SAVAYSA ) 30 MG TABS tablet Take 30 mg by mouth daily.     Multiple Vitamin (MULTIVITAMIN WITH MINERALS) TABS tablet Take 1 tablet by mouth daily.     nitroGLYCERIN  (NITROSTAT ) 0.4 MG SL tablet Place 0.4 mg under the tongue every 5 (five) minutes as needed for chest pain. (Patient not taking: Reported on 10/10/2023)     polyethylene glycol (MIRALAX  / GLYCOLAX ) 17 g packet Take 17 g by mouth daily as needed for mild constipation.     riTUXimab  (RITUXAN ) 500 MG/50ML injection Inject into the vein See admin instructions. Unknown dosage, unconfirmed frequency. See notes.     No current facility-administered medications for this encounter.    REVIEW OF SYSTEMS:  On review of systems, the patient reports that he is doing well overall. He denies any chest pain, shortness of breath, cough, fevers, chills, night sweats, unintended weight changes. He denies any bowel disturbances, and denies abdominal pain, nausea or vomiting. He denies any new musculoskeletal or joint aches or pains. His IPSS was 11, indicating moderate urinary symptoms. His SHIM was 16, indicating he does have moderate erectile dysfunction. A complete review  of systems is obtained and is otherwise negative.    PHYSICAL EXAM:  Wt Readings from Last 3 Encounters:  03/31/24 218 lb 12.8 oz (99.2 kg)  01/22/24 219 lb 8 oz (99.6 kg)  10/10/23 220 lb (99.8 kg)   Temp Readings from Last 3 Encounters:  03/31/24 97.6 F (36.4 C)  01/22/24 97.6 F (36.4 C) (Temporal)  10/13/23 98.6 F (37 C) (Oral)   BP Readings from Last 3 Encounters:  03/31/24 (!) 168/58  01/22/24 138/62  10/13/23 (!) 115/97   Pulse Readings from Last 3 Encounters:  03/31/24 63  01/22/24 73  10/13/23 85   Pain Assessment Pain Score: 8  Pain Loc: Back/10  In general this is a well appearing male in no acute distress. He's alert and oriented x4 and appropriate throughout the examination. Cardiopulmonary assessment is negative for acute distress, and he exhibits normal effort.     KPS = 100  100 - Normal; no complaints; no evidence of disease. 90   - Able to carry on normal activity; minor signs or symptoms of disease. 80   - Normal activity with effort; some signs or symptoms of disease. 18   - Cares for self; unable to carry on normal activity or to do active work. 60   - Requires occasional assistance, but is able to care for most of his personal needs. 50   - Requires considerable assistance and frequent medical care. 40   - Disabled; requires special care and assistance. 30   - Severely disabled; hospital admission is indicated although death not imminent. 20   - Very sick; hospital admission necessary; active supportive treatment necessary. 10   - Moribund; fatal processes progressing rapidly. 0     - Dead  Karnofsky DA, Abelmann WH, Craver LS and Burchenal [facility] [phone]) The use of the nitrogen mustards in the palliative treatment of carcinoma: with particular reference to bronchogenic carcinoma Cancer 1 634-56  LABORATORY DATA:  Lab Results  Component Value Date   WBC 4.3 01/22/2024   HGB 12.7 (L) 01/22/2024   HCT 39.6 01/22/2024   MCV 97.1 01/22/2024    PLT 231 01/22/2024   Lab Results  Component Value Date   NA 139 01/22/2024   K 4.3 01/22/2024   CL 104 01/22/2024   CO2 30 01/22/2024   Lab Results  Component Value Date   ALT 9 01/22/2024   AST 14 (L) 01/22/2024   ALKPHOS 203 (H) 01/22/2024   BILITOT 0.4 01/22/2024     RADIOGRAPHY: NM PET (PSMA) SKULL TO MID THIGH Result Date: 03/27/2024 CLINICAL DATA:  Initial treatment strategy for prostate cancer. Gleason 8. Previous history of lymphoma. Marginal B-cell EXAM: NUCLEAR MEDICINE PET SKULL BASE TO THIGH TECHNIQUE: 7.377 mCi F18 Posluma  was injected intravenously. Full-ring PET imaging was performed from the skull base to thigh after the radiotracer. CT data was obtained and used for attenuation correction and anatomic localization. COMPARISON:  Abdomen pelvis CT 10/10/2023. Head CT 01/28/2024. Prostate MRI 01/13/2024 FINDINGS: NECK No radiotracer activity in neck lymph nodes. There is physiologic uptake along salivary glands complex more glands. Neural structures. Incidental CT finding: Significant streak artifact related to the patient's dental hardware. There is also spinal fixation hardware in the cervical region. The parotid glands, submandibular glands and thyroid  gland are unremarkable. The paranasal sinuses and mastoid air cells are clear. Bilateral hearing devices. CHEST No radiotracer accumulation within mediastinal or hilar lymph nodes. No suspicious pulmonary nodules on the CT scan. Incidental CT finding: Breathing motion. No pneumothorax or effusion. There is some bandlike opacity in the posteromedial right upper  lobe. Minimal uptake. Favor scar atelectasis. Similar areas in the superior segment the right lower lobe. Patulous esophagus. Heart is nonenlarged. No pericardial effusion. There is some enlargement of the pulmonary artery. Please correlate for pulmonary artery hypertension. Coronary artery calcifications are noted ABDOMEN/PELVIS Prostate: There is a focus of significant uptake  along the left side of the prostate anteriorly with maximum SUV of 43.9. Please correlate for findings by MRI. Location of primary neoplasm. Lymph nodes: No abnormal radiotracer accumulation within pelvic or abdominal nodes. Liver: No evidence of liver metastasis. Incidental CT finding: Previous cholecystectomy. Grossly, liver, spleen, adrenal glands and pancreas are unremarkable. Diffuse breathing motion. No abnormal calcifications seen within either kidney nor along the course of either ureter. Slightly redundant course to the sigmoid colon. Left-sided scattered colonic diverticula. Few right-sided diverticula as well. Stomach and small bowel are nondilated. Diffuse vascular calcifications are seen. SKELETON There is some mild uptake seen along the lumbar spine posterior elements on the left side but hardware and degenerative changes are identified in this location no specific abnormal uptake also wise along the visualized osseous structures. IMPRESSION: Significant area of abnormal radiotracer uptake along left side of the prostate. Please correlate for location of primary neoplasm. No specific other areas of abnormal uptake to confirm metastatic disease. Small area of uptake along the left side lumbar spine along posterior elements but there is significant hardware and degenerative changes in this location. Is still findings of colonic diverticula.  Vascular calcifications. Electronically Signed   By: Adrianna Horde M.D.   On: 03/27/2024 15:21      IMPRESSION/PLAN: 1. 78 y.o. gentleman with Stage cT1c, radT3a adenocarcinoma of the prostate with Gleason Score of 4+4, and PSA of 6.67.  We discussed the patient's workup and outlined the nature of prostate cancer in this setting. The patient's T stage, Gleason's score, and PSA put him into the high risk group. Accordingly, he is eligible for a variety of potential treatment options including LT-ADT concurrent with 8 weeks of external radiation, 5 weeks of  external radiation with an upfront brachytherapy boost, or prostatectomy. We discussed the available radiation techniques, and focused on the details and logistics of delivery.  We discussed and outlined the risks, benefits, short and long-term effects associated with radiotherapy and compared and contrasted these with prostatectomy. We discussed the role of SpaceOAR gel in reducing the rectal toxicity associated with radiotherapy. We also detailed the role of ADT in the treatment of high risk prostate cancer and outlined the associated side effects that could be expected with this therapy. He appears to have a good understanding of his disease and our treatment recommendations which are of curative intent.  He was encouraged to ask questions that were answered to his stated satisfaction.  At the conclusion of our conversation, the patient is interested in moving forward with LT-ADT through the [place], and seed boost with myself and Dr. Valeta Gaudier followed by 5 weeks pelvic IMRT.  We personally spent 60 minutes in this encounter including chart review, reviewing radiological studies, meeting face-to-face with the patient, entering orders and completing documentation.      Kenith Payer, MD  [facility]  Radiation Oncology Direct Dial: [phone]  Fax: [phone] [email or web address]  Skype  [email or web address]   This document serves as a record of services personally performed by Kenith Payer, MD. It was created on their behalf by Florance Hun, a trained medical scribe. The creation of this record is based on the scribe's personal observations and the provider's statements to  them. This document has been checked and approved by the attending provider.

## 2024-04-01 ENCOUNTER — Encounter: Payer: Self-pay | Admitting: Urology

## 2024-04-01 DIAGNOSIS — C61 Malignant neoplasm of prostate: Secondary | ICD-10-CM | POA: Insufficient documentation

## 2024-04-02 NOTE — Progress Notes (Signed)
 Patient will be proceed with ADT, Brachy boost, and 5 weeks of external beam radiation.    Dr. Leeta Puls at the [facility] is working to establish patient for start of ADT.  Patient will met with Dr. Valeta Gaudier on 6/16 to establish care with local urologist for brachy boost procedure.   RN spoke with patient and provided update on next steps with ADT.  Will continue to follow, plan of care in progress.

## 2024-04-10 NOTE — Progress Notes (Signed)
 Patient received Eligard 45mg  at the [facility] on 5/23.     RN spoke with patient to confirm/review next steps with appointment on 6/16 with Dr. Valeta Gaudier to establish care for brachyboost.   Patient verbalized understanding.  Will continue to follow.

## 2024-04-28 ENCOUNTER — Telehealth: Payer: Self-pay | Admitting: *Deleted

## 2024-04-28 NOTE — Telephone Encounter (Signed)
 Returned patient's wife's phone call Lisa Rideau), spoke with patient's wife

## 2024-05-05 ENCOUNTER — Telehealth: Payer: Self-pay | Admitting: *Deleted

## 2024-05-05 NOTE — Telephone Encounter (Signed)
 RETURNED PATIENT'S WIFE'S PHONE CALL, SPOKE WITH WIFE Bryan [name]

## 2024-05-08 ENCOUNTER — Telehealth: Payer: Self-pay | Admitting: *Deleted

## 2024-05-08 ENCOUNTER — Other Ambulatory Visit: Payer: Self-pay | Admitting: Urology

## 2024-05-08 NOTE — Telephone Encounter (Signed)
 Returned patient's wife's phone call Lisa Rideau), spoke with patient's wife

## 2024-05-08 NOTE — Telephone Encounter (Signed)
 RETURNED PATIENT'S PHONE CALL, SPOKE WITH PATIENT'S WIFE- VICTORIA

## 2024-05-21 NOTE — Progress Notes (Signed)
 RN spoke with patient's wife, [place], and reviewed next steps with brachy boost in detail.  All questions answered, no additional needs at this time.  Will continue to follow.

## 2024-05-26 ENCOUNTER — Ambulatory Visit
Admission: RE | Admit: 2024-05-26 | Discharge: 2024-05-26 | Disposition: A | Discharge status: Home / Self Care | Source: Ambulatory Visit | Attending: Surgical | Admitting: Surgical

## 2024-06-12 NOTE — Progress Notes (Signed)
 Pre-seed nursing interview for a diagnosis of prostate cancer  Patient identity verified x2.   Patient states issues as follows...  -Pain: No -Fatigue: Yes  -Abdomen: No -Urinary: yes, nocturia  -Bowels: No -Appetite: Good  Patient denies all other related issues at this time.  Meaningful use complete.  Urinary Management medication(s)- Flomax   Urology appointment date- , with Dr.  at   No vitals needed for this visit.  Patient reports increased dizziness when he stood for CT sim table.   Patient also reports having infusion for lymphoma  8/27 then 2 weeks later in September.

## 2024-06-16 ENCOUNTER — Telehealth: Payer: Self-pay | Admitting: *Deleted

## 2024-06-16 NOTE — Telephone Encounter (Signed)
 CALLED PATIENT TO REMIND OF PRE-SEED APPTS. FOR 06-18-24, SPOKE WITH PATIENT AND HE IS AWARE OF THESE APPTS.

## 2024-06-17 NOTE — Progress Notes (Signed)
  Radiation Oncology         [phone] ________________________________  Name: Bryan [name] MRN: [id number]  Date: 06/18/2024  DOB: [birth year]  SIMULATION AND TREATMENT PLANNING NOTE PUBIC ARCH STUDY Seed Implant Boost to be Followed by IMRT  RR:Dfpuy, Prentice DELENA Raddle., FNP  Carollee Sieving, DO  DIAGNOSIS: 78 y.o. gentleman with Stage cT1c, radT3a adenocarcinoma of the prostate with Gleason score of 4+4, and PSA of 6.67.   Oncology History  Marginal zone lymphoma (HCC)  02/02/2018 Initial Diagnosis   Marginal zone lymphoma (HCC)   02/07/2018 - 02/28/2018 Chemotherapy   The patient had riTUXimab  (RITUXAN ) 800 mg in sodium chloride  0.9 % 250 mL (2.4242 mg/mL) infusion, 375 mg/m2 = 800 mg, Intravenous,  Once, 4 of 4 cycles Administration: 800 mg (02/07/2018), 800 mg (02/14/2018), 800 mg (02/21/2018), 800 mg (02/28/2018)  for chemotherapy treatment.    Malignant neoplasm of prostate (HCC)  03/06/2024 Cancer Staging   Staging form: Prostate, AJCC 8th Edition - Clinical stage from 03/06/2024: Stage IIC (cT1c, cN0, cM0, PSA: 6.7, Grade Group: 4) - Signed by Sherwood Rise, PA-C on 04/01/2024 Histopathologic type: Adenocarcinoma, NOS Stage prefix: Initial diagnosis Prostate specific antigen (PSA) range: Less than 10 Gleason primary pattern: 4 Gleason secondary pattern: 4 Gleason score: 8 Histologic grading system: 5 grade system Number of biopsy cores examined: 13 Number of biopsy cores positive: 2 Location of positive needle core biopsies: One side   04/01/2024 Initial Diagnosis   Malignant neoplasm of prostate (HCC)       ICD-10-CM   1. Malignant neoplasm of prostate (HCC)  C61       COMPLEX SIMULATION:  The patient presented today for evaluation for possible prostate seed implant. He was brought to the radiation planning suite and placed supine on the CT couch. A 3-dimensional image study set was obtained in upload to the planning computer. There, on each axial slice, I contoured the  prostate gland. Then, using three-dimensional radiation planning tools I reconstructed the prostate in view of the structures from the transperineal needle pathway to assess for possible pubic arch interference. In doing so, I did not appreciate any pubic arch interference. Also, the patient's prostate volume was estimated based on the drawn structure. The volume was 25 cc.  Given the pubic arch appearance and prostate volume, patient remains a good candidate to proceed with prostate seed implant. Today, he freely provided informed written consent to proceed.    PLAN: The patient will undergo prostate seed implant boost to 110 Gy to be followed by IMRT, concurrent with ADT (6 month Eligard at [facility] 04/03/24).   ________________________________  [name], M.D.

## 2024-06-18 ENCOUNTER — Ambulatory Visit
Admission: RE | Admit: 2024-06-18 | Discharge: 2024-06-18 | Disposition: A | Discharge status: Home / Self Care | Source: Ambulatory Visit | Attending: Radiation Oncology | Admitting: Radiation Oncology

## 2024-06-18 ENCOUNTER — Ambulatory Visit
Admission: RE | Admit: 2024-06-18 | Discharge: 2024-06-18 | Disposition: A | Discharge status: Home / Self Care | Payer: Self-pay | Source: Ambulatory Visit | Attending: Urology | Admitting: Urology

## 2024-06-18 ENCOUNTER — Encounter: Payer: Self-pay | Admitting: Urology

## 2024-06-18 VITALS — Wt 217.6 lb

## 2024-06-18 DIAGNOSIS — C61 Malignant neoplasm of prostate: Secondary | ICD-10-CM

## 2024-06-18 NOTE — Progress Notes (Signed)
 Radiation Oncology         [phone]) [phone] ________________________________  Outpatient Follow up- Pre-seed visit  Name: Bryan [name] MRN: [id number]  Date: 06/18/2024  DOB: [birth year]  RR:Dfpuy, Prentice DELENA Raddle., FNP  Carollee Sieving, DO   REFERRING PHYSICIAN: Carollee Sieving, DO  DIAGNOSIS: 78 y.o. gentleman with  Stage cT1c, radT3a adenocarcinoma of the prostate with Gleason score of 4+4, and PSA of 6.67.     ICD-10-CM   1. Malignant neoplasm of prostate (HCC)  C61       HISTORY OF PRESENT ILLNESS: Bryan [name] is a 78 y.o. male with a diagnosis of prostate cancer. 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 He was treated with Rituxan  in 02/2018 and has since been in remission. He is followed by Dr. Onesimo for surveillance, and he also continues to receive Rituxan  every 13-15 weeks under the care of his rheumatologist, Dr. Mai, for management of rheumatoid arthritis.    He was initially referred for evaluation in urology at the [facility] back in 08/2023 for recurrent UTI's. During ED visit on 10/10/23 for extensive diverticulitis, a CT A/P showed a potentially enhancing nodule along left ide of prostate.       Of note, his PSA was previously elevated back in 09/2020, at which time it was 5.01.  On repeat, it was 6.69 on 10/25/23 at [facility] through [employer]. He underwent prostate MRI on 01/13/24 showing: PI-RADS 5 lesion in left anterior transition zone at apex with extracapsular extension of tumor; no adenopathy.       The patient proceeded to transrectal ultrasound with 12 biopsies of the prostate on 03/06/24 under Ronal Shropshire, PA at the [facility].  The prostate volume measured 28.39 cc.  Out of 13 core biopsies, 2 were positive.  The maximum Gleason score was 4+4, and this was seen in 5% of biopsy sample from left side. Of note, all remaining cores were benign.   He underwent  staging PSMA PET scan on 03/27/24 showing no evidence of disease outside of the prostate. Incidentally noted was a small area of uptake along left lumbar spine associated with significant hardware and degenerative changes.     The patient reviewed the biopsy results with his urologist and was kindly referred to us  for discussion of potential radiation treatment options. We initially met the patient on 03/31/24 and he was most interested in proceeding with LT-ADT through the [place], and seed boost with SpaceOAR gel followed by 5 weeks pelvic IMRT for treatment of his disease. He has started ADT with a 6 month Eligard injection 04/03/24 and is here today for his pre-procedure imaging for planning and to answer any additional questions he may have about this treatment.   PREVIOUS RADIATION THERAPY: No  PAST MEDICAL HISTORY:  Past Medical History:  Diagnosis Date   Anemia    Arthritis    hands, knees, cervical area. Back pain. Rheumatoid arthritis- weekly injections.   BPH (benign prostatic hypertrophy)    Cancer (HCC)    Diabetes (HCC)    Essential tremor    GAD (generalized anxiety disorder) 12/05/2021   GERD (gastroesophageal reflux disease)    reports for indigestion he uses mustard    Hearing deficit    wears hearing aids bilateral   HTN (hypertension)    Obesity    OSA (obstructive sleep apnea)    PAF (paroxysmal atrial fibrillation) (HCC)  Refusal of blood transfusions as patient is Jehovah's Witness    Rheumatoid arthritis (HCC)    Right bundle branch block    history of   Seizures (HCC)    AS A CHILD. only esential tremors now.   Sleep apnea    cpap - settings at 9 per patient    Ulcer       PAST SURGICAL HISTORY: Past Surgical History:  Procedure Laterality Date   ABDOMINAL EXPOSURE N/A 07/30/2022   Procedure: ABDOMINAL EXPOSURE;  Surgeon: Gretta Lonni PARAS, MD;  Location: [facility] OR;  Service: Vascular;  Laterality: N/A;   ANTERIOR CERVICAL DECOMP/DISCECTOMY FUSION N/A  03/30/2013   Procedure: ANTERIOR CERVICAL DECOMPRESSION/DISCECTOMY FUSION 2 LEVELS;  Surgeon: Lynwood JONELLE Mill, MD;  Location: [facility] NEURO ORS;  Service: Neurosurgery;  Laterality: N/A;  C4-5 C5-6 Anterior cervical decompression/diskectomy/fusion/Allograft/Plate   ANTERIOR LUMBAR FUSION N/A 07/30/2022   Procedure: Anterior Lumbar  Interbody Fusion  - Lumbar five-sacral one posterior augmentation with iliac screws and O- arm;  Surgeon: Onetha Kuba, MD;  Location: [facility] OR;  Service: Neurosurgery;  Laterality: N/A;   CHOLECYSTECTOMY N/A 12/07/2013   Procedure: LAPAROSCOPIC CHOLECYSTECTOMY WITH INTRAOPERATIVE CHOLANGIOGRAM;  Surgeon: Elspeth KYM Schultze, MD;  Location: [facility] ORS;  Service: General;  Laterality: N/A;   COLONOSCOPY     DECOMPRESSIVE LUMBAR LAMINECTOMY LEVEL 2 N/A 02/15/2015   Procedure: COMPLETE DECOMPRESSIVE LUMBAR LAMINECTOMY L4-L5/ FORAMINOTOMY TO L4 NERVE ROOT AND L5 NERVE ROOT BILATERALLY;  Surgeon: Tanda Heading, MD;  Location: [facility] ORS;  Service: Orthopedics;  Laterality: N/A;   EYE SURGERY     right, growth excision   LAMINECTOMY WITH POSTERIOR LATERAL ARTHRODESIS LEVEL 1 N/A 07/30/2022   Procedure: LAMINECTOMY WITH POSTERIOR LATERAL ARTHRODESIS LUMBAR FIVE SACRAL ONE;  Surgeon: Onetha Kuba, MD;  Location: [facility] OR;  Service: Neurosurgery;  Laterality: N/A;   LUMBAR LAMINECTOMY/DECOMPRESSION MICRODISCECTOMY Left 01/18/2016   Procedure:  DECOMPRESSION L4-L5 MICRODISCECTOMY L4-L5 ON LEFT FOR SPINAL STENOSIS;  Surgeon: Tanda Heading, MD;  Location: [facility] ORS;  Service: Orthopedics;  Laterality: Left;   PAROTIDECTOMY Left 01/24/2018   Procedure: INCISIONAL BIOPSY OF LEFT PAROTID    MASS;  Surgeon: Terri Alan PARAS, MD;  Location: [facility] OR;  Service: ENT;  Laterality: Left;   SPINE SURGERY     TONSILLECTOMY     VASECTOMY     WRIST GANGLION EXCISION Left     FAMILY HISTORY:  Family History  Problem Relation Age of Onset   Colon cancer Maternal Grandfather    Kidney disease Brother    Hypertension Brother     Diabetes Brother    Stroke Maternal Grandmother    Diabetes Mother    Hypertension Mother    CAD Father        died of MI at age 90   Hypertension Father    Diabetes Paternal Aunt        x 4 aunts    SOCIAL HISTORY:  Social History   Socioeconomic History   Marital status: Married    Spouse name: Not on file   Number of children: 2   Years of education: Not on file   Highest education level: Not on file  Occupational History   Occupation: bus driver    Employer: RETIRED  Tobacco Use   Smoking status: Former    Current packs/day: 0.00    Average packs/day: 0.5 packs/day for 14.0 years (7.0 ttl pk-yrs)    Types: Cigarettes    Start date: 12/14/1958    Quit date: 12/14/1972  Years since quitting: 51.5   Smokeless tobacco: Never  Vaping Use   Vaping status: Never Used  Substance and Sexual Activity   Alcohol use: Yes    Comment: occasionally   Drug use: No   Sexual activity: Yes    Birth control/protection: None  Other Topics Concern   Not on file  Social History Narrative   Lives with wife   Social Drivers of Health   Financial Resource Strain: Low Risk  (08/02/2022)   Overall Financial Resource Strain (CARDIA)    Difficulty of Paying Living Expenses: Not hard at all  Food Insecurity: No Food Insecurity (03/31/2024)   Hunger Vital Sign    Worried About Running Out of Food in the Last Year: Never true    Ran Out of Food in the Last Year: Never true  Transportation Needs: No Transportation Needs (03/31/2024)   PRAPARE - Administrator, Civil Service (Medical): No    Lack of Transportation (Non-Medical): No  Physical Activity: Not on file  Stress: No Stress Concern Present (12/07/2021)   Received from [facility] of Occupational Health - Occupational Stress Questionnaire    Feeling of Stress : Not at all  Social Connections: Unknown (03/23/2022)   Received from [facility]   Social Network    Social Network: Not on file   Intimate Partner Violence: Not At Risk (03/31/2024)   Humiliation, Afraid, Rape, and Kick questionnaire    Fear of Current or Ex-Partner: No    Emotionally Abused: No    Physically Abused: No    Sexually Abused: No    ALLERGIES: Terazosin, Adalimumab, Gabapentin , Statins, Ampicillin, Lisinopril , and Other  MEDICATIONS:  Current Outpatient Medications  Medication Sig Dispense Refill   acetaminophen  (TYLENOL ) 325 MG tablet Take 1-2 tablets (325-650 mg total) by mouth every 4 (four) hours as needed for mild pain.     amLODipine  (NORVASC ) 5 MG tablet Take 5 mg by mouth daily.     atorvastatin  (LIPITOR) 20 MG tablet Take 20 mg by mouth daily. (Patient not taking: Reported on 03/31/2024)     diclofenac  Sodium (VOLTAREN ) 1 % GEL Apply 2 g topically 4 (four) times daily.     edoxaban  (SAVAYSA ) 30 MG TABS tablet Take 30 mg by mouth daily.     furosemide  (LASIX ) 20 MG tablet Take 20 mg by mouth 2 (two) times daily as needed.     metoprolol  (TOPROL -XL) 200 MG 24 hr tablet Take 100 mg by mouth daily.     Multiple Vitamin (MULTIVITAMIN WITH MINERALS) TABS tablet Take 1 tablet by mouth daily.     nitroGLYCERIN  (NITROSTAT ) 0.4 MG SL tablet Place 0.4 mg under the tongue every 5 (five) minutes as needed for chest pain. (Patient not taking: Reported on 10/10/2023)     polyethylene glycol (MIRALAX  / GLYCOLAX ) 17 g packet Take 17 g by mouth daily as needed for mild constipation.     primidone  (MYSOLINE ) 50 MG tablet Take 100 mg by mouth 2 (two) times daily.     riTUXimab  (RITUXAN ) 500 MG/50ML injection Inject into the vein See admin instructions. Unknown dosage, unconfirmed frequency. See notes.     senna-docusate (SENOKOT-S) 8.6-50 MG tablet Take 1 tablet by mouth as needed.     Tamsulosin  HCl (FLOMAX ) 0.4 MG CAPS Take 0.8 mg by mouth at bedtime.     No current facility-administered medications for this encounter.    REVIEW OF SYSTEMS:   On review of systems, the patient reports  that he is doing well  overall. He denies any chest pain, shortness of breath, cough, fevers, chills, night sweats, unintended weight changes. He denies any bowel disturbances, and denies abdominal pain, nausea or vomiting. He denies any new musculoskeletal or joint aches or pains. His IPSS was 11, indicating moderate urinary symptoms. His SHIM was 16, indicating he does have moderate erectile dysfunction. A complete review of systems is obtained and is otherwise negative.     PHYSICAL EXAM:  Wt Readings from Last 3 Encounters:  06/18/24 217 lb 9.6 oz (98.7 kg)  03/31/24 218 lb 12.8 oz (99.2 kg)  01/22/24 219 lb 8 oz (99.6 kg)   Temp Readings from Last 3 Encounters:  03/31/24 97.6 F (36.4 C)  01/22/24 97.6 F (36.4 C) (Temporal)  10/13/23 98.6 F (37 C) (Oral)   BP Readings from Last 3 Encounters:  03/31/24 (!) 168/58  01/22/24 138/62  10/13/23 (!) 115/97   Pulse Readings from Last 3 Encounters:  03/31/24 63  01/22/24 73  10/13/23 85   Pain Assessment Pain Score: 0-No pain/10  In general this is a well appearing African American male in no acute distress. He's alert and oriented x4 and appropriate throughout the examination. Cardiopulmonary assessment is negative for acute distress, and he exhibits normal effort.     KPS = 100  100 - Normal; no complaints; no evidence of disease. 90   - Able to carry on normal activity; minor signs or symptoms of disease. 80   - Normal activity with effort; some signs or symptoms of disease. 27   - Cares for self; unable to carry on normal activity or to do active work. 60   - Requires occasional assistance, but is able to care for most of his personal needs. 50   - Requires considerable assistance and frequent medical care. 40   - Disabled; requires special care and assistance. 30   - Severely disabled; hospital admission is indicated although death not imminent. 20   - Very sick; hospital admission necessary; active supportive treatment necessary. 10   -  Moribund; fatal processes progressing rapidly. 0     - Dead  Karnofsky DA, Abelmann WH, Craver LS and Burchenal [facility] [phone]) The use of the nitrogen mustards in the palliative treatment of carcinoma: with particular reference to bronchogenic carcinoma Cancer 1 634-56  LABORATORY DATA:  Lab Results  Component Value Date   WBC 4.3 01/22/2024   HGB 12.7 (L) 01/22/2024   HCT 39.6 01/22/2024   MCV 97.1 01/22/2024   PLT 231 01/22/2024   Lab Results  Component Value Date   NA 139 01/22/2024   K 4.3 01/22/2024   CL 104 01/22/2024   CO2 30 01/22/2024   Lab Results  Component Value Date   ALT 9 01/22/2024   AST 14 (L) 01/22/2024   ALKPHOS 203 (H) 01/22/2024   BILITOT 0.4 01/22/2024     RADIOGRAPHY: DG Lumbar Spine Complete Result Date: 05/29/2024 CLINICAL DATA:  Back pain for years.  Previous spine surgery. EXAM: LUMBAR SPINE - COMPLETE 4+ VIEW COMPARISON:  01/10/2023. FINDINGS: Postop changes plate screw posterior pedicle fusion, discectomies and laminectomies L2-S1. Degenerative disc disease with marginal osteophytes at each lumbar level. No new compression deformities. IMPRESSION: Postop changes. Degenerative changes. Electronically Signed   By: Fonda Field M.D.   On: 05/29/2024 10:30      IMPRESSION/PLAN: 1. 78 y.o. gentleman with  Stage cT1c, radT3a adenocarcinoma of the prostate with Gleason score of 4+4, and PSA of  6.67.  The patient has elected to proceed with LT-ADT through the [place], and seed boost with SpaceOAR gel followed by 5 weeks pelvic IMRT for treatment of his disease. He got a 6 month Eligard ADT injection on 04/03/24. We reviewed the risks, benefits, short and long-term effects associated with brachytherapy and discussed the role of SpaceOAR in reducing the rectal toxicity associated with radiotherapy.  He appears to have a good understanding of his disease and our treatment recommendations which are of curative intent.  He was encouraged to ask questions that were answered  to his stated satisfaction. He has freely signed written consent to proceed today in the office and a copy of this document will be placed in his medical record. His procedure is tentatively scheduled for 07/07/24 in collaboration with Dr. Elisabeth and we will see him back for his post-procedure visit approximately 3 weeks thereafter to get him ready for the 5 week course of daily external beam radiation that is to follow. We look forward to continuing to participate in his care. He knows that he is welcome to call with any questions or concerns at any time in the interim.  I personally spent 30 minutes in this encounter including chart review, reviewing radiological studies, meeting face-to-face with the patient, entering orders and completing documentation.    Sabra MICAEL Rusk, MMS, PA-C [place]  [facility] Radiation Oncology Physician Assistant Direct Dial: [phone]  Fax: [phone]

## 2024-06-22 ENCOUNTER — Encounter (HOSPITAL_COMMUNITY): Payer: Self-pay

## 2024-06-22 NOTE — Patient Instructions (Addendum)
 SURGICAL WAITING ROOM VISITATION Patients having surgery or a procedure may have no more than 2 support people in the waiting area - these visitors may rotate.    Children under the age of 2 must have an adult with them who is not the patient.  If the patient needs to stay at the hospital during part of their recovery, the visitor guidelines for inpatient rooms apply. Pre-op nurse will coordinate an appropriate time for 1 support person to accompany patient in pre-op.  This support person may not rotate.    Please refer to the [facility] website for the visitor guidelines for Inpatients (after your surgery is over and you are in a regular room).       Your procedure is scheduled on: 07-07-24   Report to [facility] Main Entrance    Report to admitting at 11:30 AM   Call this number if you have problems the morning of surgery [phone]   Do not eat food or drink liquids :After Midnight.          If you have questions, please contact your surgeon's office.   FOLLOW BOWEL PREP AND ANY ADDITIONAL PRE OP INSTRUCTIONS YOU RECEIVED FROM YOUR SURGEON'S OFFICE!!!      Fleet enema - administer the morning of surgery  Oral Hygiene is also important to reduce your risk of infection.                                    Remember - BRUSH YOUR TEETH THE MORNING OF SURGERY WITH YOUR REGULAR TOOTHPASTE   Do NOT smoke after Midnight   Take these medicines the morning of surgery with A SIP OF WATER:    Amlodipine    Ezetimibe   Metoprolol    Primidone    Tylenol  if needed  Stop all vitamins and herbal supplements 7 days before surgery  Pradaxa  - hold two days prior                              You may not have any metal on your body including  jewelry, and body piercing             Do not wear  lotions, powders,  cologne, or deodorant              Men may shave face and neck.   Do not bring valuables to the hospital. [facility] IS NOT RESPONSIBLE   FOR VALUABLES.   Contacts,  dentures or bridgework may not be worn into surgery.  DO NOT BRING YOUR HOME MEDICATIONS TO THE HOSPITAL. PHARMACY WILL DISPENSE MEDICATIONS LISTED ON YOUR MEDICATION LIST TO YOU DURING YOUR ADMISSION IN THE HOSPITAL!    Patients discharged on the day of surgery will not be allowed to drive home.  Someone NEEDS to stay with you for the first 24 hours after anesthesia.   Special Instructions: Bring a copy of your healthcare power of attorney and living will documents the day of surgery if you haven't scanned them before.              Please read over the following fact sheets you were given: IF YOU HAVE QUESTIONS ABOUT YOUR PRE-OP INSTRUCTIONS PLEASE CALL [phone] G[facility]  If you received a COVID test during your pre-op visit  it is requested that you wear a mask when out in public, stay  away from anyone that may not be feeling well and notify your surgeon if you develop symptoms. If you test positive for Covid or have been in contact with anyone that has tested positive in the last 10 days please notify you surgeon.  [facility] - Preparing for Surgery Before surgery, you can play an important role.  Because skin is not sterile, your skin needs to be as free of germs as possible.  You can reduce the number of germs on your skin by washing with CHG (chlorahexidine gluconate) soap before surgery.  CHG is an antiseptic cleaner which kills germs and bonds with the skin to continue killing germs even after washing. Please DO NOT use if you have an allergy to CHG or antibacterial soaps.  If your skin becomes reddened/irritated stop using the CHG and inform your nurse when you arrive at Short Stay. Do not shave (including legs and underarms) for at least 48 hours prior to the first CHG shower.  You may shave your face/neck.  Please follow these instructions carefully:  1.  Shower with CHG Soap the night before surgery and the  morning of surgery.  2.  If you choose to wash your hair, wash your hair  first as usual with your normal  shampoo.  3.  After you shampoo, rinse your hair and body thoroughly to remove the shampoo.                             4.  Use CHG as you would any other liquid soap.  You can apply chg directly to the skin and wash.  Gently with a scrungie or clean washcloth.  5.  Apply the CHG Soap to your body ONLY FROM THE NECK DOWN.   Do   not use on face/ open                           Wound or open sores. Avoid contact with eyes, ears mouth and   genitals (private parts).                       Wash face,  Genitals (private parts) with your normal soap.             6.  Wash thoroughly, paying special attention to the area where your    surgery  will be performed.  7.  Thoroughly rinse your body with warm water from the neck down.  8.  DO NOT shower/wash with your normal soap after using and rinsing off the CHG Soap.                9.  Pat yourself dry with a clean towel.            10.  Wear clean pajamas.            11.  Place clean sheets on your bed the night of your first shower and do not  sleep with pets. Day of Surgery : Do not apply any lotions/deodorants the morning of surgery.  Please wear clean clothes to the hospital/surgery center.  FAILURE TO FOLLOW THESE INSTRUCTIONS MAY RESULT IN THE CANCELLATION OF YOUR SURGERY  PATIENT SIGNATURE_________________________________  NURSE SIGNATURE__________________________________  ________________________________________________________________________

## 2024-06-22 NOTE — Progress Notes (Addendum)
  Date of COVID positive in last 90 days:  PCP - Prentice Sharps, FNP Cardiologist - [facility] (last OV 12-26-23 [facility])  Chest x-ray -  EKG - 10-10-23 Epic Stress Test - 12-19-21 [facility]  ECHO - 10-24 per [facility] note Cardiac Cath -  Pacemaker/ICD device last checked: Spinal Cord Stimulator: Non-telemetry monitor - 04-27-20 Epic Coronary CT - 07-30-18 Epic  Bowel Prep -   Sleep Study - Yes, +sleep apnea CPAP -   Fasting Blood Sugar -  Checks Blood Sugar _____ times a day  Last dose of GLP1 agonist-  N/A GLP1 instructions:  Do not take after     Last dose of SGLT-2 inhibitors-  N/A SGLT-2 instructions:  Do not take after     Blood Thinner Instructions:  Savaysa  Last dose:   Time: Aspirin  Instructions: Last Dose:  Activity level:  Can go up a flight of stairs and perform activities of daily living without stopping and without symptoms of chest pain or shortness of breath.  Able to exercise without symptoms  Unable to go up a flight of stairs without symptoms of     Anesthesia review: CAD, Afib, HTN, CKD, pulmonary HTN  Patient denies shortness of breath, fever, cough and chest pain at PAT appointment  Patient verbalized understanding of instructions that were given to them at the PAT appointment. Patient was also instructed that they will need to review over the PAT instructions again at home before surgery.

## 2024-06-24 ENCOUNTER — Other Ambulatory Visit: Payer: Self-pay

## 2024-06-24 ENCOUNTER — Encounter (HOSPITAL_COMMUNITY): Payer: Self-pay

## 2024-06-24 ENCOUNTER — Encounter (HOSPITAL_COMMUNITY)
Admission: RE | Admit: 2024-06-24 | Discharge: 2024-06-24 | Disposition: A | Discharge status: Home / Self Care | Source: Ambulatory Visit | Attending: Urology | Admitting: Urology

## 2024-06-24 VITALS — BP 158/70 | HR 61 | Temp 98.0°F | Resp 18 | Ht 65.5 in | Wt 220.2 lb

## 2024-06-24 DIAGNOSIS — D649 Anemia, unspecified: Secondary | ICD-10-CM | POA: Diagnosis not present

## 2024-06-24 DIAGNOSIS — Z01812 Encounter for preprocedural laboratory examination: Secondary | ICD-10-CM | POA: Diagnosis present

## 2024-06-24 DIAGNOSIS — I2510 Atherosclerotic heart disease of native coronary artery without angina pectoris: Secondary | ICD-10-CM | POA: Diagnosis not present

## 2024-06-24 HISTORY — DX: Atherosclerotic heart disease of native coronary artery without angina pectoris: I25.10

## 2024-06-24 HISTORY — DX: Cerebral infarction, unspecified: I63.9

## 2024-06-24 HISTORY — DX: Non-Hodgkin lymphoma, unspecified, unspecified site: C85.90

## 2024-06-24 LAB — CBC
HCT: 33.1 % — ABNORMAL LOW (ref 39.0–52.0)
Hemoglobin: 10.5 g/dL — ABNORMAL LOW (ref 13.0–17.0)
MCH: 31.4 pg (ref 26.0–34.0)
MCHC: 31.7 g/dL (ref 30.0–36.0)
MCV: 99.1 fL (ref 80.0–100.0)
Platelets: 196 K/uL (ref 150–400)
RBC: 3.34 MIL/uL — ABNORMAL LOW (ref 4.22–5.81)
RDW: 13.6 % (ref 11.5–15.5)
WBC: 4.1 K/uL (ref 4.0–10.5)
nRBC: 0 % (ref 0.0–0.2)

## 2024-06-24 LAB — BASIC METABOLIC PANEL WITH GFR
Anion gap: 6 (ref 5–15)
BUN: 25 mg/dL — ABNORMAL HIGH (ref 8–23)
CO2: 28 mmol/L (ref 22–32)
Calcium: 8.5 mg/dL — ABNORMAL LOW (ref 8.9–10.3)
Chloride: 105 mmol/L (ref 98–111)
Creatinine, Ser: 1.43 mg/dL — ABNORMAL HIGH (ref 0.61–1.24)
GFR, Estimated: 50 mL/min — ABNORMAL LOW (ref >60)
Glucose, Bld: 95 mg/dL (ref 70–99)
Potassium: 3.5 mmol/L (ref 3.5–5.1)
Sodium: 139 mmol/L (ref 135–145)

## 2024-07-01 ENCOUNTER — Encounter (HOSPITAL_COMMUNITY): Payer: Self-pay

## 2024-07-01 NOTE — Anesthesia Preprocedure Evaluation (Addendum)
 Anesthesia Evaluation  Patient identified by MRN, date of birth, ID band Patient awake    Reviewed: Allergy & Precautions, NPO status , Patient's Chart, lab work & pertinent test results  Airway Mallampati: II  TM Distance: >3 FB Neck ROM: Full    Dental no notable dental hx.    Pulmonary asthma , sleep apnea , former smoker   Pulmonary exam normal breath sounds clear to auscultation       Cardiovascular hypertension, Pt. on medications + CAD  + dysrhythmias Atrial Fibrillation + Valvular Problems/Murmurs MR  Rhythm:Regular Rate:Normal + Systolic murmurs    Neuro/Psych   Anxiety     negative neurological ROS  negative psych ROS   GI/Hepatic Neg liver ROS,GERD  ,,  Endo/Other  diabetes, Type 2    Renal/GU Renal InsufficiencyRenal disease  negative genitourinary   Musculoskeletal  (+) Arthritis , Rheumatoid disorders,    Abdominal  (+) + obese  Peds negative pediatric ROS (+)  Hematology  (+) Blood dyscrasia, anemia   Anesthesia Other Findings   Reproductive/Obstetrics negative OB ROS                              Anesthesia Physical Anesthesia Plan  ASA: 3  Anesthesia Plan: General   Post-op Pain Management:    Induction: Intravenous  PONV Risk Score and Plan: 2 and Ondansetron , Treatment may vary due to age or medical condition and Midazolam   Airway Management Planned: Oral ETT  Additional Equipment:   Intra-op Plan:   Post-operative Plan: Extubation in OR  Informed Consent: I have reviewed the patients History and Physical, chart, labs and discussed the procedure including the risks, benefits and alternatives for the proposed anesthesia with the patient or authorized representative who has indicated his/her understanding and acceptance.     Dental advisory given  Plan Discussed with: CRNA and Surgeon  Anesthesia Plan Comments: (See PAT note from 8/13)          Anesthesia Quick Evaluation

## 2024-07-01 NOTE — Progress Notes (Signed)
 Case: [id number] Date/Time: 07/07/24 1330   Procedures:      INSERTION, RADIATION SOURCE, PROSTATE - RADIOACTIVE SEED IMPLANTS AND SPACEOAR     INJECTION, HYDROGEL SPACER   Anesthesia type: General   Diagnosis: Prostate cancer (HCC) [C61]   Pre-op diagnosis: PROSTATE CANCER   Location: [facility] ROOM 03 / [facility]   Surgeons: Elisabeth Valli BIRCH, MD       DISCUSSION: Bryan [name] is a 78 yo male with PMH of former smoking, HTN, nonobstructive CAD (by CTA), PAF on Pradaxa , hx of PE, pulmonary HTN, OSA (uses CPAP), tremor, GERD, diabetes, CKD3, RA, arthritis, anemia, anxiety, multiple lumbar fusions L2-S1, s/p cervical fusion (2014), non- Hodgkin's lymphoma.  Pt is Jehovah's witness and refuses blood products  Follows cardiology at the [facility] for history of nonobstructive CAD managed medically, paroxysmal atrial flutter on Pradaxa , postop PE, pulmonary hypertension. He recently underwent Lexiscan  stress test with myocardial perfusion imaging (August 2024) that showed no evidence of ischemia or infarction. An echocardiogram in October 2024 showed normal left ventricular ejection fraction at over 55% but moderate pulmonary hypertension with right ventricular systolic pressure estimated at 50 to 55 mmHg. Pt reported not using his CPAP consistently. He was last seen in clinic on 12/26/23. Advised to wear CPAP. BP controlled. HR controlled.  History of rheumatoid arthritis and lymphoma, maintained on rituximab .   History of C4-6 ACDF.  VS: BP (!) 158/70   Pulse 61   Temp 36.7 C (Oral)   Resp 18   Ht 5' 5.5 (1.664 m)   Wt 99.9 kg   SpO2 97%   BMI 36.09 kg/m   PROVIDERS: Claudene Prentice DELENA Mickey., [facility] (records requested) Cardiologist - POTHOULAKIS,ANTHONY NORLEEN [facility] (last OV 12-26-23 CEW)  LABS: Labs reviewed: Acceptable for surgery. Anemia and CKD stable (all labs ordered are listed, but only abnormal results are displayed)  Labs Reviewed  BASIC METABOLIC PANEL WITH GFR -  Abnormal; Notable for the following components:      Result Value   BUN 25 (*)    Creatinine, Ser 1.43 (*)    Calcium  8.5 (*)    GFR, Estimated 50 (*)    All other components within normal limits  CBC - Abnormal; Notable for the following components:   RBC 3.34 (*)    Hemoglobin 10.5 (*)    HCT 33.1 (*)    All other components within normal limits     IMAGES:   EKG 10/09/24:  Sinus rhythm, rate 64 Right bundle branch block  CV:  Lexiscan  stress test/August 2024 ([facility]) 1. Normal myocardial perfusion. There is no evidence of myocardial ischemia or infarction. 2. Normal LV EF at 67%. Mildly increased LV end-diastolic volumes. 3. Low-risk nuclear stress test.   Echo Oct. 2024 ([facility] note 12/[facility]/23) The left ventricular size, thickness and function are normal EF = 55-60% (Visual Estimation). EF = 59% by 2DBP method. No diastolic dysfunction parameters to suggest elevated left atrial pressure or congestive heart failure. The E/e'ratio is 7.5, consistent with normal LA pressures. The right ventricle is normal in size and function. There is mild aortic regurgitation. There is trace mitral regurgitation. There is mild to moderate tricuspid regurgitation. There is moderate pulmonary hypertension. RVSP is estimated at 50-55 mmHg.    CT Coronary 07/30/2018: - Aorta:  Normal size.  No calcifications.  No dissection. - Aortic Valve:  Trileaflet.  No calcifications. - Coronary Arteries:  Normal coronary origin.  Right dominance. - RCA is a large  dominant artery that gives rise to PDA and PLVB. There is minimal on-calcified plaque with stenosis 0-25%. - Left main is a large artery that gives rise to LAD and LCX arteries. Left main has minimal stenosis. - LAD is a large vessel that has mild mixed plaque in the proximal and mid portio with stenosis 25-50% and a focal 50-69% stenosis in the mid LAD. - LCX is a non-dominant artery that gives rise to one large OM1 branch.  There is minimal plaque with associated stenosis 0-25%. - Other findings: Normal pulmonary vein drainage into the left atrium. Normal let atrial appendage without a thrombus. Normal size of the pulmonary artery. IMPRESSION: 1. Coronary calcium  score of 21. This was 20 percentile for age and sex matched control. 2. Normal coronary origin with right dominance. 3. There is mild to moderate CAD in the proximal and mid LAD. Aggressive risk factor modification is recommended.  Past Medical History:  Diagnosis Date   Anemia    Arthritis    hands, knees, cervical area. Back pain. Rheumatoid arthritis- weekly injections.   BPH (benign prostatic hypertrophy)    Cancer (HCC)    Coronary artery disease    Diabetes (HCC)    Essential tremor    GAD (generalized anxiety disorder) 12/05/2021   GERD (gastroesophageal reflux disease)    reports for indigestion he uses mustard    Hearing deficit    wears hearing aids bilateral   HTN (hypertension)    Lymphoma (HCC)    Obesity    OSA (obstructive sleep apnea)    PAF (paroxysmal atrial fibrillation) (HCC)    Refusal of blood transfusions as patient is Jehovah's Witness    Rheumatoid arthritis (HCC)    Right bundle branch block    history of   Seizures (HCC)    AS A CHILD. only esential tremors now.   Sleep apnea    cpap - settings at 9 per patient    Stroke [facility])    per MRI   Ulcer     Past Surgical History:  Procedure Laterality Date   ABDOMINAL EXPOSURE N/A 07/30/2022   Procedure: ABDOMINAL EXPOSURE;  Surgeon: Gretta Lonni PARAS, MD;  Location: [facility] OR;  Service: Vascular;  Laterality: N/A;   ANTERIOR CERVICAL DECOMP/DISCECTOMY FUSION N/A 03/30/2013   Procedure: ANTERIOR CERVICAL DECOMPRESSION/DISCECTOMY FUSION 2 LEVELS;  Surgeon: Lynwood JONELLE Mill, MD;  Location: [facility];  Service: Neurosurgery;  Laterality: N/A;  C4-5 C5-6 Anterior cervical decompression/diskectomy/fusion/Allograft/Plate   ANTERIOR LUMBAR FUSION N/A 07/30/2022    Procedure: Anterior Lumbar  Interbody Fusion  - Lumbar five-sacral one posterior augmentation with iliac screws and O- arm;  Surgeon: Onetha Kuba, MD;  Location: [facility] OR;  Service: Neurosurgery;  Laterality: N/A;   CHOLECYSTECTOMY N/A 12/07/2013   Procedure: LAPAROSCOPIC CHOLECYSTECTOMY WITH INTRAOPERATIVE CHOLANGIOGRAM;  Surgeon: Elspeth KYM Schultze, MD;  Location: [facility];  Service: General;  Laterality: N/A;   COLONOSCOPY     DECOMPRESSIVE LUMBAR LAMINECTOMY LEVEL 2 N/A 02/15/2015   Procedure: COMPLETE DECOMPRESSIVE LUMBAR LAMINECTOMY L4-L5/ FORAMINOTOMY TO L4 NERVE ROOT AND L5 NERVE ROOT BILATERALLY;  Surgeon: Tanda Heading, MD;  Location[facility];  Service: Orthopedics;  Laterality: N/A;   EYE SURGERY     right, growth excision   LAMINECTOMY WITH POSTERIOR LATERAL ARTHRODESIS LEVEL 1 N/A 07/30/2022   Procedure: LAMINECTOMY WITH POSTERIOR LATERAL ARTHRODESIS LUMBAR FIVE SACRAL ONE;  Surgeon: Onetha Kuba, MD;  Location: [facility] OR;  Service: Neurosurgery;  Laterality: N/A;   LUMBAR LAMINECTOMY/DECOMPRESSION MICRODISCECTOMY Left 01/18/2016  Procedure:  DECOMPRESSION L4-L5 MICRODISCECTOMY L4-L5 ON LEFT FOR SPINAL STENOSIS;  Surgeon: Tanda Heading, MD;  Location[facility];  Service: Orthopedics;  Laterality: Left;   PAROTIDECTOMY Left 01/24/2018   Procedure: INCISIONAL BIOPSY OF LEFT PAROTID    MASS;  Surgeon: Terri Alan PARAS, MD;  Location: [facility];  Service: ENT;  Laterality: Left;   PROSTATE BIOPSY     SPINE SURGERY     TONSILLECTOMY     VASECTOMY     WRIST GANGLION EXCISION Left     MEDICATIONS:  albuterol  (VENTOLIN  HFA) 108 (90 Base) MCG/ACT inhaler   amLODipine  (NORVASC ) 5 MG tablet   Calcium  Carb-Cholecalciferol (CALCIUM  1000 + D PO)   dabigatran  (PRADAXA ) 150 MG CAPS capsule   docusate sodium  (COLACE) 50 MG capsule   ezetimibe (ZETIA) 10 MG tablet   famotidine  (PEPCID ) 20 MG tablet   furosemide  (LASIX ) 20 MG tablet   metoprolol  succinate (TOPROL -XL) 100 MG 24 hr tablet   nitroGLYCERIN   (NITROSTAT ) 0.4 MG SL tablet   polyethylene glycol (MIRALAX  / GLYCOLAX ) 17 g packet   primidone  (MYSOLINE ) 50 MG tablet   riTUXimab  (RITUXAN ) 500 MG/50ML injection   Tamsulosin  HCl (FLOMAX ) 0.4 MG CAPS   No current facility-administered medications for this encounter.   Burnard CHRISTELLA Odis DEVONNA [facility] Short Stay/Anesthesiology [facility] Phone [phone] 07/01/2024 10:38 AM

## 2024-07-06 ENCOUNTER — Telehealth: Payer: Self-pay | Admitting: *Deleted

## 2024-07-06 NOTE — Telephone Encounter (Signed)
 CALLED PATIENT TO REMIND OF PROCEDURE FOR 07-07-24, SPOKE WITH PATIENT AND HE IS AWARE OF THIS PROCEDURE

## 2024-07-07 ENCOUNTER — Other Ambulatory Visit: Payer: Self-pay

## 2024-07-07 ENCOUNTER — Ambulatory Visit (HOSPITAL_COMMUNITY): Payer: Self-pay | Admitting: Medical

## 2024-07-07 ENCOUNTER — Ambulatory Visit (HOSPITAL_BASED_OUTPATIENT_CLINIC_OR_DEPARTMENT_OTHER): Admitting: Certified Registered"

## 2024-07-07 ENCOUNTER — Encounter (HOSPITAL_COMMUNITY): Payer: Self-pay | Admitting: Urology

## 2024-07-07 ENCOUNTER — Ambulatory Visit (HOSPITAL_COMMUNITY)

## 2024-07-07 ENCOUNTER — Ambulatory Visit (HOSPITAL_COMMUNITY)
Admission: RE | Admit: 2024-07-07 | Discharge: 2024-07-07 | Disposition: A | Discharge status: Home / Self Care | Source: Ambulatory Visit | Attending: Urology | Admitting: Urology

## 2024-07-07 ENCOUNTER — Encounter (HOSPITAL_COMMUNITY)
Admission: RE | Disposition: A | Discharge status: Home / Self Care | Payer: Self-pay | Source: Ambulatory Visit | Attending: Urology

## 2024-07-07 DIAGNOSIS — K219 Gastro-esophageal reflux disease without esophagitis: Secondary | ICD-10-CM | POA: Diagnosis not present

## 2024-07-07 DIAGNOSIS — R351 Nocturia: Secondary | ICD-10-CM | POA: Diagnosis not present

## 2024-07-07 DIAGNOSIS — N401 Enlarged prostate with lower urinary tract symptoms: Secondary | ICD-10-CM | POA: Diagnosis not present

## 2024-07-07 DIAGNOSIS — C61 Malignant neoplasm of prostate: Secondary | ICD-10-CM | POA: Insufficient documentation

## 2024-07-07 DIAGNOSIS — E119 Type 2 diabetes mellitus without complications: Secondary | ICD-10-CM | POA: Diagnosis not present

## 2024-07-07 DIAGNOSIS — I2510 Atherosclerotic heart disease of native coronary artery without angina pectoris: Secondary | ICD-10-CM

## 2024-07-07 DIAGNOSIS — Z79899 Other long term (current) drug therapy: Secondary | ICD-10-CM | POA: Insufficient documentation

## 2024-07-07 DIAGNOSIS — Z87891 Personal history of nicotine dependence: Secondary | ICD-10-CM

## 2024-07-07 DIAGNOSIS — I340 Nonrheumatic mitral (valve) insufficiency: Secondary | ICD-10-CM | POA: Diagnosis not present

## 2024-07-07 DIAGNOSIS — Z7901 Long term (current) use of anticoagulants: Secondary | ICD-10-CM | POA: Insufficient documentation

## 2024-07-07 DIAGNOSIS — I10 Essential (primary) hypertension: Secondary | ICD-10-CM | POA: Insufficient documentation

## 2024-07-07 DIAGNOSIS — G4730 Sleep apnea, unspecified: Secondary | ICD-10-CM | POA: Insufficient documentation

## 2024-07-07 DIAGNOSIS — I4891 Unspecified atrial fibrillation: Secondary | ICD-10-CM | POA: Insufficient documentation

## 2024-07-07 HISTORY — PX: SPACE OAR INSTILLATION: SHX6769

## 2024-07-07 HISTORY — PX: RADIOACTIVE SEED IMPLANT: SHX5150

## 2024-07-07 LAB — NO BLOOD PRODUCTS

## 2024-07-07 LAB — GLUCOSE, CAPILLARY: Glucose-Capillary: 80 mg/dL (ref 70–99)

## 2024-07-07 SURGERY — INSERTION, RADIATION SOURCE, PROSTATE
Anesthesia: General

## 2024-07-07 MED ORDER — SUGAMMADEX SODIUM 200 MG/2ML IV SOLN
INTRAVENOUS | Status: AC
  Filled 2024-07-07: qty 2

## 2024-07-07 MED ORDER — SUGAMMADEX SODIUM 200 MG/2ML IV SOLN
INTRAVENOUS | Status: DC | PRN
  Administered 2024-07-07: 200 mg via INTRAVENOUS

## 2024-07-07 MED ORDER — LIDOCAINE HCL (CARDIAC) PF 100 MG/5ML IV SOSY
PREFILLED_SYRINGE | INTRAVENOUS | Status: DC | PRN
  Administered 2024-07-07: 100 mg via INTRAVENOUS

## 2024-07-07 MED ORDER — ORAL CARE MOUTH RINSE: 15.0000 mL | Freq: Once | OROMUCOSAL | Status: AC

## 2024-07-07 MED ORDER — FLEET ENEMA RE ENEM: 1.0000 | ENEMA | Freq: Once | RECTAL | Status: DC

## 2024-07-07 MED ORDER — SUCCINYLCHOLINE CHLORIDE 200 MG/10ML IV SOSY
PREFILLED_SYRINGE | INTRAVENOUS | Status: AC
  Filled 2024-07-07: qty 10

## 2024-07-07 MED ORDER — PROPOFOL 10 MG/ML IV BOLUS
INTRAVENOUS | Status: DC | PRN
  Administered 2024-07-07: 150 mg via INTRAVENOUS

## 2024-07-07 MED ORDER — SUCCINYLCHOLINE CHLORIDE 200 MG/10ML IV SOSY
PREFILLED_SYRINGE | INTRAVENOUS | Status: DC | PRN
Start: 2024-07-07 — End: 2024-07-07
  Administered 2024-07-07: 120 mg via INTRAVENOUS

## 2024-07-07 MED ORDER — FENTANYL CITRATE (PF) 100 MCG/2ML IJ SOLN
INTRAMUSCULAR | Status: AC
  Filled 2024-07-07: qty 2

## 2024-07-07 MED ORDER — ROCURONIUM BROMIDE 100 MG/10ML IV SOLN
INTRAVENOUS | Status: DC | PRN
  Administered 2024-07-07: 40 mg via INTRAVENOUS

## 2024-07-07 MED ORDER — CEFAZOLIN SODIUM-DEXTROSE 2-4 GM/100ML-% IV SOLN
2.0000 g | Freq: Once | INTRAVENOUS | Status: AC
  Administered 2024-07-07: 2 g via INTRAVENOUS
  Filled 2024-07-07: qty 100

## 2024-07-07 MED ORDER — SODIUM CHLORIDE (PF) 0.9 % IJ SOLN
INTRAMUSCULAR | Status: AC
  Filled 2024-07-07: qty 10

## 2024-07-07 MED ORDER — FENTANYL CITRATE (PF) 100 MCG/2ML IJ SOLN
INTRAMUSCULAR | Status: DC | PRN
  Administered 2024-07-07: 50 ug via INTRAVENOUS

## 2024-07-07 MED ORDER — DEXAMETHASONE SODIUM PHOSPHATE 10 MG/ML IJ SOLN
INTRAMUSCULAR | Status: AC
  Filled 2024-07-07: qty 1

## 2024-07-07 MED ORDER — HYDROMORPHONE HCL 1 MG/ML IJ SOLN
INTRAMUSCULAR | Status: AC
  Filled 2024-07-07: qty 1

## 2024-07-07 MED ORDER — ROCURONIUM BROMIDE 10 MG/ML (PF) SYRINGE
PREFILLED_SYRINGE | INTRAVENOUS | Status: AC
  Filled 2024-07-07: qty 10

## 2024-07-07 MED ORDER — LACTATED RINGERS IV SOLN: INTRAVENOUS | Status: DC

## 2024-07-07 MED ORDER — IOHEXOL 300 MG/ML  SOLN
INTRAMUSCULAR | Status: DC | PRN
  Administered 2024-07-07: 5 mL

## 2024-07-07 MED ORDER — AMISULPRIDE (ANTIEMETIC) 5 MG/2ML IV SOLN: 10.0000 mg | Freq: Once | INTRAVENOUS | Status: DC | PRN

## 2024-07-07 MED ORDER — OXYCODONE HCL 5 MG PO TABS: 5.0000 mg | ORAL_TABLET | Freq: Once | ORAL | Status: DC | PRN

## 2024-07-07 MED ORDER — PHENYLEPHRINE HCL-NACL 20-0.9 MG/250ML-% IV SOLN
INTRAVENOUS | Status: DC | PRN
  Administered 2024-07-07: 25 ug/min via INTRAVENOUS

## 2024-07-07 MED ORDER — HYDROMORPHONE HCL 1 MG/ML IJ SOLN
0.2500 mg | INTRAMUSCULAR | Status: DC | PRN
  Administered 2024-07-07 (×4): 0.5 mg via INTRAVENOUS

## 2024-07-07 MED ORDER — CHLORHEXIDINE GLUCONATE 0.12 % MT SOLN
15.0000 mL | Freq: Once | OROMUCOSAL | Status: AC
  Administered 2024-07-07: 15 mL via OROMUCOSAL

## 2024-07-07 MED ORDER — PROPOFOL 10 MG/ML IV BOLUS
INTRAVENOUS | Status: AC
  Filled 2024-07-07: qty 20

## 2024-07-07 MED ORDER — SODIUM CHLORIDE (PF) 0.9 % IJ SOLN
INTRAMUSCULAR | Status: DC | PRN
  Administered 2024-07-07: 1000 mL

## 2024-07-07 MED ORDER — ONDANSETRON HCL 4 MG/2ML IJ SOLN
INTRAMUSCULAR | Status: AC
  Filled 2024-07-07: qty 2

## 2024-07-07 MED ORDER — SODIUM CHLORIDE 0.9 % IV SOLN: 12.5000 mg | INTRAVENOUS | Status: DC | PRN

## 2024-07-07 MED ORDER — OXYCODONE HCL 5 MG/5ML PO SOLN: 5.0000 mg | Freq: Once | ORAL | Status: DC | PRN

## 2024-07-07 MED ORDER — TRAMADOL HCL 50 MG PO TABS
50.0000 mg | ORAL_TABLET | Freq: Four times a day (QID) | ORAL | 0 refills | Status: DC | PRN
Start: 2024-07-07 — End: 2024-08-31

## 2024-07-07 SURGICAL SUPPLY — 28 items
CATH ROBINSON RED A/P 20FR (CATHETERS) ×1 IMPLANT
COVER MAYO STAND STRL (DRAPES) ×1 IMPLANT
DRAPE SHEET LG 3/4 BI-LAMINATE (DRAPES) IMPLANT
DRAPE U-SHAPE 47X51 STRL (DRAPES) ×1 IMPLANT
DRSG TEGADERM 4X4.75 (GAUZE/BANDAGES/DRESSINGS) IMPLANT
DRSG TEGADERM 8X12 (GAUZE/BANDAGES/DRESSINGS) ×1 IMPLANT
GLOVE BIO SURGEON STRL SZ 6.5 (GLOVE) ×2 IMPLANT
GLOVE SURG ORTHO 8.5 STRL (GLOVE) IMPLANT
GOWN STRL REUS W/ TWL LRG LVL3 (GOWN DISPOSABLE) ×1 IMPLANT
GRID BRACH TEMP 18GA 2.8X3X.75 (MISCELLANEOUS) ×1 IMPLANT
HOLDER FOLEY CATH W/STRAP (MISCELLANEOUS) IMPLANT
IMPL SPACEOAR SYSTEM 10ML (Spacer) ×1 IMPLANT
KIT TURNOVER KIT A (KITS) ×1 IMPLANT
MARKER SKIN DUAL TIP RULER LAB (MISCELLANEOUS) ×1 IMPLANT
NDL BRACHY 18G 5PK (NEEDLE) ×4 IMPLANT
NDL BRACHY 18G SINGLE (NEEDLE) IMPLANT
NDL PK MORGANSTERN STABILIZ (NEEDLE) ×1 IMPLANT
NEEDLE BRACHY 18G 5PK (NEEDLE) ×4 IMPLANT
NEEDLE BRACHY 18G SINGLE (NEEDLE) IMPLANT
NEEDLE PK MORGANSTERN STABILIZ (NEEDLE) ×1 IMPLANT
PACK CYSTO (CUSTOM PROCEDURE TRAY) ×1 IMPLANT
Radioactive Seed Implants IMPLANT
SHEATH ULTRASOUND LF (SHEATH) ×1 IMPLANT
SHEATH ULTRASOUND LTX NONSTRL (SHEATH) ×1 IMPLANT
SYR 10ML LL (SYRINGE) IMPLANT
TOWEL OR 17X26 10 PK STRL BLUE (TOWEL DISPOSABLE) ×1 IMPLANT
TRAY FOLEY MTR SLVR 16FR STAT (SET/KITS/TRAYS/PACK) ×1 IMPLANT
UNDERPAD 30X36 HEAVY ABSORB (UNDERPADS AND DIAPERS) ×1 IMPLANT

## 2024-07-07 NOTE — Anesthesia Procedure Notes (Signed)
 Procedure Name: Intubation Date/Time: 07/07/2024 2:24 PM  Performed by: Nada Corean CROME, CRNAPre-anesthesia Checklist: Emergency Drugs available, Patient identified, Suction available, Patient being monitored and Timeout performed Patient Re-evaluated:Patient Re-evaluated prior to induction Oxygen Delivery Method: Circle system utilized Preoxygenation: Pre-oxygenation with 100% oxygen Induction Type: IV induction Ventilation: Mask ventilation without difficulty Laryngoscope Size: Mac and 4 Grade View: Grade II Tube type: Oral Tube size: 7.5 mm Number of attempts: 2 Airway Equipment and Method: Stylet Placement Confirmation: ETT inserted through vocal cords under direct vision, breath sounds checked- equal and bilateral and positive ETCO2 Secured at: 23 cm Tube secured with: Tape Dental Injury: Teeth and Oropharynx as per pre-operative assessment

## 2024-07-07 NOTE — Progress Notes (Signed)
 Radiation Oncology         [phone] ________________________________  Name: Bryan [name] MRN: [id number]  Date: 07/07/2024  DOB: 1946/07/01       Prostate Seed Implant  RR:Dfpuy, Prentice Bryan [name]., FNP  No ref. provider found  DIAGNOSIS:   78 y.o. gentleman with Stage cT1c, radT3a adenocarcinoma of the prostate with Gleason score of 4+4, and PSA of 6.67.   Oncology History  Marginal zone lymphoma (HCC)  02/02/2018 Initial Diagnosis   Marginal zone lymphoma (HCC)   02/07/2018 - 02/28/2018 Chemotherapy   The patient had riTUXimab  (RITUXAN ) 800 mg in sodium chloride  0.9 % 250 mL (2.4242 mg/mL) infusion, 375 mg/m2 = 800 mg, Intravenous,  Once, 4 of 4 cycles Administration: 800 mg (02/07/2018), 800 mg (02/14/2018), 800 mg (02/21/2018), 800 mg (02/28/2018)  for chemotherapy treatment.    Malignant neoplasm of prostate (HCC)  03/06/2024 Cancer Staging   Staging form: Prostate, AJCC 8th Edition - Clinical stage from 03/06/2024: Stage IIC (cT1c, cN0, cM0, PSA: 6.7, Grade Group: 4) - Signed by Sherwood Rise, PA-C on 04/01/2024 Histopathologic type: Adenocarcinoma, NOS Stage prefix: Initial diagnosis Prostate specific antigen (PSA) range: Less than 10 Gleason primary pattern: 4 Gleason secondary pattern: 4 Gleason score: 8 Histologic grading system: 5 grade system Number of biopsy cores examined: 13 Number of biopsy cores positive: 2 Location of positive needle core biopsies: One side   04/01/2024 Initial Diagnosis   Malignant neoplasm of prostate (HCC)     No diagnosis found.  PROCEDURE: Insertion of radioactive I-125 seeds into the prostate gland.  RADIATION DOSE: 110 Gy, boost therapy.  TECHNIQUE: Bryan [name] was brought to the operating room with the urologist. He was placed in the dorsolithotomy position. He was catheterized and a rectal tube was inserted. The perineum was shaved, prepped and draped. The ultrasound probe was then introduced by me into the rectum to see the prostate  gland.  TREATMENT DEVICE: I attached the needle grid to the ultrasound probe stand and anchor needles were placed.  3D PLANNING: The prostate was imaged in 3D using a sagittal sweep of the prostate probe. These images were transferred to the planning computer. There, the prostate, urethra and rectum were defined on each axial reconstructed image. Then, the software created an optimized 3D plan and a few seed positions were adjusted. The quality of the plan was reviewed using [facility] information for the target and the following two organs at risk:  Urethra and Rectum.  Then the accepted plan was printed and handed off to the radiation therapist.  Under my supervision, the custom loading of the seeds and spacers was carried out using the quick loader.  These pre-loaded needles were then placed into the needle holder.SABRA  PROSTATE VOLUME STUDY:  Using transrectal ultrasound the volume of the prostate was verified to be 20.4 cc.  SPECIAL TREATMENT PROCEDURE/SUPERVISION AND HANDLING: The pre-loaded needles were then delivered by the urologist under sagittal guidance. A total of 14 needles were used to deposit 59 seeds in the prostate gland. The individual seed activity was 0.250 mCi.  SpaceOAR:  Yes  COMPLEX SIMULATION: At the end of the procedure, an anterior radiograph of the pelvis was obtained to document seed positioning and count. Cystoscopy was performed by the urologist to check the urethra and bladder.  MICRODOSIMETRY: At the end of the procedure, the patient was emitting 0.084 mR/hr at 1 meter. Accordingly, he was considered safe for hospital discharge.  PLAN: The patient will return to the  radiation oncology clinic for post implant CT dosimetry in three weeks.   ________________________________  Bryan [name], M.D.

## 2024-07-07 NOTE — Interval H&P Note (Signed)
 History and Physical Interval Note:  07/07/2024 1:17 PM  Bryan [name]  has presented today for surgery, with the diagnosis of PROSTATE CANCER.  The various methods of treatment have been discussed with the patient and family. After consideration of risks, benefits and other options for treatment, the patient has consented to  Procedure(s) with comments: INSERTION, RADIATION SOURCE, PROSTATE (N/A) - RADIOACTIVE SEED IMPLANTS AND SPACEOAR INJECTION, HYDROGEL SPACER (N/A) as a surgical intervention.  The patient's history has been reviewed, patient examined, no change in status, stable for surgery.  I have reviewed the patient's chart and labs.  Questions were answered to the patient's satisfaction.     Viraat Vanpatten D Mahaley Schwering

## 2024-07-07 NOTE — Transfer of Care (Signed)
 Immediate Anesthesia Transfer of Care Note  Patient: Bryan [name]  Procedure(s) Performed: INSERTION, RADIATION SOURCE, PROSTATE INJECTION, HYDROGEL SPACER  Patient Location: PACU  Anesthesia Type:General  Level of Consciousness: awake, alert , oriented, and patient cooperative  Airway & Oxygen Therapy: Patient Spontanous Breathing and Patient connected to face mask oxygen  Post-op Assessment: Report given to RN and Post -op Vital signs reviewed and stable  Post vital signs: Reviewed and stable  Last Vitals:  Vitals Value Taken Time  BP 156/101 07/07/24 15:48  Temp 36.5 C 07/07/24 15:49  Pulse    Resp 15 07/07/24 15:50  SpO2 100 % 07/07/24 15:49  Vitals shown include unfiled device data.  Last Pain:  Vitals:   07/07/24 1203  TempSrc:   PainSc: 0-No pain      Patients Stated Pain Goal: 5 (07/07/24 1203)  Complications: No notable events documented.

## 2024-07-07 NOTE — Op Note (Signed)
 PATIENT:  Bryan [name]  PRE-OPERATIVE DIAGNOSIS:  Adenocarcinoma of the prostate  POST-OPERATIVE DIAGNOSIS:  Same  PROCEDURE:  1. I-125 radioactive seed implantation 2. Cystoscopy  3. Placement of SpaceOAR 4. Fluoroscopy use with time less than 1 hour.  SURGEON:  Surgeon(s): Valli Shank, MD  Radiation oncologist: Dr. Donnice Barge  ANESTHESIA:  General  EBL:  Minimal  DRAINS: None  INDICATION: Bryan [name]  Description of procedure: After informed consent the patient was brought to the major OR, placed on the table and administered general anesthesia. He was then moved to the modified lithotomy position with his perineum perpendicular to the floor. His perineum and genitalia were then sterilely prepped. An official timeout was then performed. A 16 French Foley catheter was then placed in the bladder and filled with dilute contrast, a rectal tube was placed in the rectum and the transrectal ultrasound probe was placed in the rectum and affixed to the stand. He was then sterilely draped.  Real time ultrasonography was used along with the seed planning software [employer] Prostate vs. 4.2.2.4. This was used to develop the seed plan including the number of needles as well as number of seeds required for complete and adequate coverage.  The needles were then preloaded with seeds and spacers according to the previously developed plan.  Real-time ultrasonography was then used along with the previously developed plan to implant a total of 59 seeds using 14 needles. This proceeded without difficulty or complication.   I then proceeded with placement of SpaceOAR by introducing a needle with the bevel angled inferiorly approximately 2 cm superior to the anus. This was angled downward and under direct ultrasound was placed within the space between the prostatic capsule and rectum. This was confirmed with a small amount of sterile saline injected and this was performed under direct ultrasound. I  then attached the SpaceOAR to the needle and injected this in the space between the prostate and rectum with good placement noted.  A Foley catheter was then removed as well as the transrectal ultrasound probe and rectal probe. Flexible cystoscopy was then performed using the 17 French flexible scope which revealed a normal urethra throughout its length down to the sphincter which appeared intact. The prostatic urethra revealed bilobar hypertrophy but no evidence of obstruction, seeds, spacers or lesions. The bladder was then entered and fully and systematically inspected. The ureteral orifices were noted to be of normal configuration and position. The mucosa revealed no evidence of tumors. There were also no stones identified within the bladder. I noted no seeds or spacers on the floor of the bladder and retroflexion of the scope revealed no seeds protruding from the base of the prostate.  C-arm fluoroscopy was then used to evaluate the distribution of seeds placement and aid in determining confirmation of the number of seeds placed.  Real-time fluoroscopy was used with saved images revealing the location of the seeds placed both in AP and oblique views.  The cystoscope was then removed and the patient was awakened and taken to recovery room in stable and satisfactory condition. He tolerated procedure well and there were no intraoperative complications.

## 2024-07-07 NOTE — H&P (Signed)
 CC/HPI: cc: Prostate cancer   04/27/2024: 78 year old man hospitalized for diverticulitis in March 2023 found to have abnormal enhancement of the prostate. Subsequently had an MRI of the prostate showed a PI-RADS 5 lesion. He had an 8 core prostate biopsy that showed Gleason 4+4=8 on the left side corresponding to the MRI lesion. There is also concern for extracapsular extension. PSA 5.26. PSMA PET scan on 03/27/24 showing no evidence of disease outside of the prostate. Incidentally noted was a small area of uptake along left lumbar spine associated with significant hardware and degenerative changes. He has met with medical oncology at the [facility] and they have decided to proceed with 2 years of ADT plus brachy boost/EBRT. He is on Pradaxa  which is prescribed by cardiology at [facility]. He does take tamsulosin  twice daily for LUTS. He also has sleep apnea but does not wear CPAP machine. He wakes up 3-4 times a night to urinate.   IPSS: 0, 0, 0, 5, 0, 0, 4= 9/35  6/6 terrible quality of life     ALLERGIES: Lisinopril  Statin Drugs    MEDICATIONS: Tamsulosin  HCl 0.4 MG Capsule  Cetirizine HCl 10 MG Tablet  Dabigatran  Etexilate Mesylate  dilTIAZem  HCl ER 120 MG Capsule Extended Release 12 Hour  Ezetimibe 10 MG Tablet  Famotidine  20 MG Tablet  Furosemide  20 MG Tablet  Mupirocin  2 % Ointment  Nitroglycerin  0.4 MG Tablet Sublingual  Primidone  50 MG Tablet  Sucralfate  1 GM Tablet  traMADol -Acetaminophen  37.5-325 MG Tablet  Vitamin B12     GU PSH: Vasectomy       PSH Notes: Parotidectomy    NON-GU PSH: Cholecystectomy (laparoscopic) Lumbar Laminectomy Tonsillectomy     GU PMH: None     PMH Notes: Cancer lymphoma    NON-GU PMH: Anxiety Arthritis Chronic kidney disease, stage 3b Diabetes Type 2 DVT, History GERD Hypercholesterolemia Hypertension Sleep Apnea    FAMILY HISTORY: Diabetes - Runs in Family Hypertension - Runs in Family kidney disease - Runs in Family stroke - Runs in  Family   SOCIAL HISTORY: Marital Status: Married Preferred Language: English; Ethnicity: Not Hispanic Or Latino; Race: Black or African American Current Smoking Status: Patient does not smoke anymore.   Tobacco Use Assessment Completed: Used Tobacco in last 30 days? Does drink.  Drinks 3 caffeinated drinks per day.    REVIEW OF SYSTEMS:    GU Review Male:   Patient reports frequent urination, get up at night to urinate, and erection problems. Patient denies hard to postpone urination, burning/ pain with urination, leakage of urine, stream starts and stops, trouble starting your stream, have to strain to urinate , and penile pain.  Gastrointestinal (Upper):   Patient denies nausea, vomiting, and indigestion/ heartburn.  Gastrointestinal (Lower):   Patient denies diarrhea and constipation.  Constitutional:   Patient reports fatigue. Patient denies fever, night sweats, and weight loss.  Skin:   Patient denies skin rash/ lesion and itching.  Eyes:   Patient denies blurred vision and double vision.  Ears/ Nose/ Throat:   Patient denies sore throat and sinus problems.  Hematologic/Lymphatic:   Patient denies swollen glands and easy bruising.  Cardiovascular:   Patient reports leg swelling. Patient denies chest pains.  Respiratory:   Patient denies shortness of breath and cough.  Endocrine:   Patient reports excessive thirst.   Musculoskeletal:   Patient reports back pain and joint pain.   Neurological:   Patient reports dizziness. Patient denies headaches.  Psychologic:   Patient reports depression and anxiety.  VITAL SIGNS:      04/27/2024 11:48 AM  Weight 223 lb / 101.15 kg  Height 65.5 in / 166.37 cm  BP 128/71 mmHg  Pulse 60 /min  Temperature 98.8 F / 37.1 C  BMI 36.5 kg/m   MULTI-SYSTEM PHYSICAL EXAMINATION:    Constitutional: Well-nourished. No physical deformities. Normally developed. Good grooming.  Neck: Neck symmetrical, not swollen. Normal tracheal position.   Respiratory: No labored breathing, no use of accessory muscles.   Skin: No paleness, no jaundice, no cyanosis. No lesion, no ulcer, no rash.  Neurologic / Psychiatric: Oriented to time, oriented to place, oriented to person. No depression, no anxiety, no agitation.  Eyes: Normal conjunctivae. Normal eyelids.  Ears, Nose, Mouth, and Throat: Left ear no scars, no lesions, no masses. Right ear no scars, no lesions, no masses. Nose no scars, no lesions, no masses. Normal hearing. Normal lips.  Musculoskeletal: Normal gait and station of head and neck.     Complexity of Data:  Records Review:   Previous Hospital Records, Previous Patient Records, POC Tool  Urine Test Review:   Urinalysis  X-Ray Review: PET- PSMA Scan: Reviewed Films. Reviewed Report. Discussed With Patient. IMPRESSION:  Significant area of abnormal radiotracer uptake along left side of  the prostate. Please correlate for location of primary neoplasm.   No specific other areas of abnormal uptake to confirm metastatic  disease.   Small area of uptake along the left side lumbar spine along  posterior elements but there is significant hardware and  degenerative changes in this location.   Is still findings of colonic diverticula. Vascular calcifications.    Electronically Signed  By: Ranell Bring M.D.  On: 03/27/2024 15:21    PROCEDURES:         PVR Ultrasound - 48201  Scanned Volume: 25 cc         Urinalysis Dipstick Dipstick Cont'd  Color: Yellow Bilirubin: Neg mg/dL  Appearance: Clear Ketones: Neg mg/dL  Specific Gravity: 8.979 Blood: Neg ery/uL  pH: 5.5 Protein: Neg mg/dL  Glucose: Neg mg/dL Urobilinogen: 0.2 mg/dL    Nitrites: Neg    Leukocyte Esterase: Neg leu/uL    ASSESSMENT:      ICD-10 Details  1 GU:   Prostate Cancer - C61 Chronic, Stable  2   BPH w/LUTS - N40.1 Chronic, Stable  3   Nocturia - R35.1 Chronic, Stable   PLAN:           Document Letter(s):  Created for Patient: Clinical Summary          Notes:   1. Prostate cancer:  - Patient with new high risk prostate cancer diagnosis  -he received his first ADT shot 1 month ago at [facility]  - He has already met with Dr. [name] and will undergo brachy boost + EBRT with 18 months of ADT. Medical oncology note also mentions abiraterone.  - We will schedule him for fiducial markers and SpaceOAR  - Risks and benefits of brachytherapy with SpaceOAR discussed with the patient in detail and he has elected to proceed    2. BPH with LUTS: Recommend patient wear CPAP machine to help cut down on nocturia. Continue tamsulosin  twice daily.

## 2024-07-07 NOTE — Discharge Instructions (Signed)
 DISCHARGE INSTRUCTIONS FOR PROSTATE SEED IMPLANTATION  Antibiotics You may be given a prescription for an antibiotic to take when you arrive home. If so, be sure to take every tablet in the bottle, even if you are feeling better before the prescription is finished. If you begin itching, notice a rash or start to swell on your trunk, arms, legs and/or throat, immediately stop taking the antibiotic and call your Urologist. Diet Resume your usual diet when you return home. To keep your bowels moving easily and softly, drink prune, apple and cranberry juice at room temperature. You may also take a stool softener, such as Colace, which is available without prescription at local pharmacies. Daily activities ? No driving or heavy lifting for at least two days after the implant. ? No bike riding, horseback riding or riding lawn mowers for the first month after the implant. ? Any strenuous physical activity should be approved by your doctor before you resume it. Sexual relations You may resume sexual relations two weeks after the procedure. A condom should be used for the first two weeks. Your semen may be dark brown or black; this is normal and is related bleeding that may have occurred during the implant. Postoperative swelling Expect swelling and bruising of the scrotum and perineum (the area between the scrotum and anus). Both the swelling and the bruising should resolve in l or 2 weeks. Ice packs and over- the-counter medications such as Tylenol, Advil or Aleve may lessen your discomfort. Postoperative urination Most men experience burning on urination and/or urinary frequency. If this becomes bothersome, contact your Urologist.  Medication can be prescribed to relieve these problems.  It is normal to have some blood in your urine for a few days after the implant. Special instructions related to the seeds It is unlikely that you will pass an Iodine-125 seed in your urine. The seeds are silver in color  and are about as large as a grain of rice. If you pass a seed, do not handle it with your fingers. Use a spoon to place it in an envelope or jar in place this in base occluded area such as the garage or basement for return to the radiation clinic at your convenience.  Contact your doctor for ? Temperature greater than 101 F ? Increasing pain ? Inability to urinate Follow-up  You should have follow up with your urologist and radiation oncologist about 3 weeks after the procedure. General information regarding Iodine seeds ? Iodine-125 is a low energy radioactive material. It is not deeply penetrating and loses energy at short distances. Your prostate will absorb the radiation. Objects that are touched or used by the patient do not become radioactive. ? Body wastes (urine and stool) or body fluids (saliva, tears, semen or blood) are not radioactive. ? The Nuclear Regulatory Commission [facility]) has determined that no radiation precautions are needed for patients undergoing Iodine-125 seed implantation. The [facility] states that such patients do not present a risk to the people around them, including young children and pregnant women. However, in keeping with the general principle that radiation exposure should be kept as low reasonably possible, we suggest the following: ? Children and pets should not sit on the patient's lap for the first two (2) weeks after the implant. ? Pregnant (or possibly pregnant) women should avoid prolonged, close contact with the patient for the first two (2) weeks after the implant. ? A distance of three (3) feet is acceptable. ? At a distance of three (3)  feet, there is no limit to the length of time anyone can be with the patient. ?

## 2024-07-08 ENCOUNTER — Other Ambulatory Visit: Payer: Self-pay | Admitting: Urology

## 2024-07-08 ENCOUNTER — Encounter (HOSPITAL_COMMUNITY): Payer: Self-pay | Admitting: Urology

## 2024-07-08 DIAGNOSIS — C61 Malignant neoplasm of prostate: Secondary | ICD-10-CM

## 2024-07-08 NOTE — Anesthesia Postprocedure Evaluation (Signed)
 Anesthesia Post Note  Patient: Bryan [name]  Procedure(s) Performed: INSERTION, RADIATION SOURCE, PROSTATE INJECTION, HYDROGEL SPACER     Patient location during evaluation: PACU Anesthesia Type: General Level of consciousness: awake and alert Pain management: pain level controlled Vital Signs Assessment: post-procedure vital signs reviewed and stable Respiratory status: spontaneous breathing, nonlabored ventilation and respiratory function stable Cardiovascular status: blood pressure returned to baseline and stable Postop Assessment: no apparent nausea or vomiting Anesthetic complications: no   No notable events documented.  Last Vitals:  Vitals:   07/07/24 1745 07/07/24 1800  BP: (!) 195/86 (!) 180/81  Pulse: (!) 45 63  Resp: 15 15  Temp:  36.7 C  SpO2: 100% 91%    Last Pain:  Vitals:   07/07/24 1800  TempSrc:   PainSc: 0-No pain                 Butler Levander Pinal

## 2024-07-10 NOTE — Progress Notes (Signed)
 RN left message for call back to assess any questions on next steps post brachy boost.

## 2024-07-14 ENCOUNTER — Telehealth: Payer: Self-pay | Admitting: *Deleted

## 2024-07-14 NOTE — Telephone Encounter (Signed)
 RETURNED PATIENT'S PHONE CALL, SPOKE WITH PATIENT'S WIFE- VICTORIA

## 2024-07-20 ENCOUNTER — Telehealth: Payer: Self-pay | Admitting: *Deleted

## 2024-07-20 NOTE — Telephone Encounter (Signed)
 CALLED PATIENT TO REMIND OF SIM AND MRI FOR 07-21-24, SPOKE WITH PATIENT AND HE IS AWARE OF THESE APPTS. AND THE INSTRUCTIONS

## 2024-07-21 ENCOUNTER — Ambulatory Visit (HOSPITAL_COMMUNITY)
Admission: RE | Admit: 2024-07-21 | Discharge: 2024-07-21 | Disposition: A | Discharge status: Home / Self Care | Source: Ambulatory Visit | Attending: Urology | Admitting: Urology

## 2024-07-21 ENCOUNTER — Ambulatory Visit
Admission: RE | Admit: 2024-07-21 | Discharge: 2024-07-21 | Disposition: A | Discharge status: Home / Self Care | Source: Ambulatory Visit | Attending: Radiation Oncology | Admitting: Radiation Oncology

## 2024-07-21 DIAGNOSIS — C61 Malignant neoplasm of prostate: Secondary | ICD-10-CM | POA: Insufficient documentation

## 2024-07-21 DIAGNOSIS — Z510 Encounter for antineoplastic radiation therapy: Secondary | ICD-10-CM | POA: Insufficient documentation

## 2024-07-25 NOTE — Progress Notes (Addendum)
  Radiation Oncology         [phone] ________________________________  Name: TEVAN MARIAN MRN: [id number]  Date: 07/21/2024  DOB: February 18, 1946  COMPLEX SIMULATION NOTE  NARRATIVE:  The patient was brought to the CT Simulation planning suite today following prostate seed implantation approximately one month ago.  Identity was confirmed.  All relevant records and images related to the planned course of therapy were reviewed.  Then, the patient was set-up supine.  CT images were obtained.  The CT images were loaded into the planning software.  Then the prostate and rectum were contoured.  Treatment planning then occurred.  The implanted Iodine 125 seeds were identified by the physics staff for projection of radiation distribution  I have requested : 3D Simulation  I have requested a DVH of the following structures: Prostate and rectum.    ________________________________  [name], M.D.

## 2024-07-25 NOTE — Progress Notes (Signed)
  Radiation Oncology         [phone] ________________________________  Name: Bryan [name] MRN: [id number]  Date: 07/21/2024  DOB: 1946/07/05  SIMULATION AND TREATMENT PLANNING NOTE    ICD-10-CM   1. Malignant neoplasm of prostate (HCC)  C61       DIAGNOSIS:  78 y.o. gentleman with Stage cT1c, radT3a adenocarcinoma of the prostate with Gleason score of 4+4, and PSA of 6.67.   NARRATIVE:  The patient was brought to the CT Simulation planning suite.  Identity was confirmed.  All relevant records and images related to the planned course of therapy were reviewed.  The patient freely provided informed written consent to proceed with treatment after reviewing the details related to the planned course of therapy. The consent form was witnessed and verified by the simulation staff.  Then, the patient was set-up in a stable reproducible supine position for radiation therapy.  A vacuum lock pillow device was custom fabricated to position his legs in a reproducible immobilized position.  Then, I performed a urethrogram under sterile conditions to identify the prostatic apex.  CT images were obtained.  Surface markings were placed.  The CT images were loaded into the planning software.  Then the prostate target and avoidance structures including the rectum, bladder, bowel and hips were contoured.  Treatment planning then occurred.  The radiation prescription was entered and confirmed.  A total of one complex treatment devices were fabricated. I have requested : Intensity Modulated Radiotherapy (IMRT) is medically necessary for this case for the following reason:  Rectal sparing.SABRA  PLAN:  The patient will receive 45 Gy in 25 fractions of 1.8 Gy, to supplement an up-front prostate seed implant boost of 110 Gy to achieve a total nominal dose of 155 Gy.  ________________________________  [name], M.D.

## 2024-07-30 ENCOUNTER — Encounter: Payer: Self-pay | Admitting: Radiation Oncology

## 2024-07-30 DIAGNOSIS — Z510 Encounter for antineoplastic radiation therapy: Secondary | ICD-10-CM | POA: Diagnosis not present

## 2024-07-30 NOTE — Progress Notes (Signed)
  Radiation Oncology         [phone] ________________________________  Name: Bryan [name] MRN: [id number]  Date: 07/30/2024  DOB: Sep 15, 1946  3D Planning Note   Prostate Brachytherapy Post-Implant Dosimetry  Diagnosis: 78 y.o. gentleman with Stage cT1c, radT3a adenocarcinoma of the prostate with Gleason score of 4+4, and PSA of 6.67.   Narrative: On a previous date, Bryan [name] returned following prostate seed implantation for post implant planning. He underwent CT scan complex simulation to delineate the three-dimensional structures of the pelvis and demonstrate the radiation distribution.  Since that time, the seed localization, and complex isodose planning with dose volume histograms have now been completed.  Results:   Prostate Coverage - The dose of radiation delivered to the 90% or more of the prostate gland (D90) was 99.14% of the prescription dose. This exceeds our goal of greater than 90%. Rectal Sparing - The volume of rectal tissue receiving the prescription dose or higher was 0.0 cc. This falls under our thresholds tolerance of 1.0 cc.  Impression: The prostate seed implant appears to show adequate target coverage and appropriate rectal sparing.  Plan:  The patient will continue to follow with urology for ongoing PSA determinations. I would anticipate a high likelihood for local tumor control with minimal risk for rectal morbidity.  ________________________________  [name], M.D.

## 2024-08-03 DIAGNOSIS — Z510 Encounter for antineoplastic radiation therapy: Secondary | ICD-10-CM | POA: Diagnosis not present

## 2024-08-03 DIAGNOSIS — C61 Malignant neoplasm of prostate: Secondary | ICD-10-CM | POA: Diagnosis present

## 2024-08-04 ENCOUNTER — Ambulatory Visit

## 2024-08-05 ENCOUNTER — Other Ambulatory Visit: Payer: Self-pay

## 2024-08-05 ENCOUNTER — Ambulatory Visit
Admission: RE | Admit: 2024-08-05 | Discharge: 2024-08-05 | Disposition: A | Discharge status: Home / Self Care | Source: Ambulatory Visit | Attending: Radiation Oncology | Admitting: Radiation Oncology

## 2024-08-05 DIAGNOSIS — Z510 Encounter for antineoplastic radiation therapy: Secondary | ICD-10-CM | POA: Diagnosis not present

## 2024-08-05 LAB — RAD ONC ARIA SESSION SUMMARY
Course Elapsed Days: 0
Plan Fractions Treated to Date: 1
Plan Prescribed Dose Per Fraction: 1.8 Gy
Plan Total Fractions Prescribed: 25
Plan Total Prescribed Dose: 45 Gy
Reference Point Dosage Given to Date: 1.8 Gy
Reference Point Session Dosage Given: 1.8 Gy
Session Number: 1

## 2024-08-05 NOTE — Radiation Completion Notes (Signed)
 Patient Name: Bryan [name], Bryan [name] MRN: [id number] Date of Birth: November 21, 1945 Referring Physician: TORIBIO LIBRA, M.D. Date of Service: 2024-08-05 Radiation Oncologist: Adina Barge, M.D. [facility] [place]                             RADIATION ONCOLOGY END OF TREATMENT NOTE     Diagnosis: C61 Malignant neoplasm of prostate Staging on 2024-03-06: Malignant neoplasm of prostate (HCC) T=cT1c, N=cN0, M=cM0 Intent: Curative     ==========DELIVERED PLANS==========  First Treatment Date: 2024-08-03 Last Treatment Date: 2024-08-05   Plan Name: Prostate_Pelv Site: Prostate Technique: IMRT Mode: Photon Dose Per Fraction: 1.8 Gy Prescribed Dose (Delivered / Prescribed): 1.8 Gy / 45 Gy Prescribed Fxs (Delivered / Prescribed): 1 / 25   Plan Name: Prostate Seed Implant Site: Prostate Technique: Radioactive Seed Implant I-125 Mode: Brachytherapy Dose Per Fraction: 110 Gy Prescribed Dose (Delivered / Prescribed): 110 Gy / 110 Gy Prescribed Fxs (Delivered / Prescribed): 1 / 1     ==========ON TREATMENT VISIT DATES==========      ==========UPCOMING VISITS========== 09/08/2024 [facility] ONC FINAL TREATMENT [facility]RAD[facility] LINAC 3  09/07/2024 [facility] ONC IMRT TREATMENT [facility] LINAC 3  09/04/2024 [facility] ONC IMRT TREATMENT [facility] LINAC 3  09/03/2024 [facility] ONC IMRT TREATMENT [facility] LINAC 3  09/02/2024 [facility] ONC IMRT TREATMENT [facility] LINAC 3  09/01/2024 [facility] ONC IMRT TREATMENT [facility] LINAC 3  08/31/2024 [facility] ONC IMRT TREATMENT [facility] LINAC 3  08/28/2024 [facility] ONC IMRT TREATMENT [facility] LINAC 3  08/27/2024 [facility] ONC IMRT TREATMENT [facility] LINAC 3  08/26/2024 [facility] ONC IMRT TREATMENT [facility] LINAC 3  08/25/2024 [facility] ONC IMRT TREATMENT [facility] LINAC 3  08/24/2024 [facility] ONC IMRT  TREATMENT [facility] LINAC 3  08/21/2024 [facility] ONC IMRT TREATMENT [facility] LINAC 3  08/20/2024 [facility] ONC IMRT TREATMENT [facility] LINAC 3  08/19/2024 [facility] ONC IMRT TREATMENT [facility] LINAC 4  08/18/2024 [facility]RAD[facility]ATION ONC IMRT TREATMENT [facility]RAD[facility]C [id number]  08/17/2024 [facility]RAD[facility] ONC IMRT TREATMENT [facility] LINAC 3  08/14/2024 [facility] ONC IMRT TREATMENT [facility] LINAC 3  08/13/2024 [facility] ONC IMRT TREATMENT [facility] LINAC 3  08/12/2024 [facility] ONC IMRT TREATMENT [facility] LINAC 3  08/11/2024 [facility] ONC IMRT TREATMENT [facility] LINAC 3  08/10/2024 [facility] ONC IMRT TREATMENT [facility] LINAC 3  08/07/2024 [facility] ONC IMRT TREATMENT [facility] LINAC 3  08/06/2024 [facility]-RAD[facility]ATION ONC WEEKLY UT CHECK [facility]-MANNING  08/06/2024 [facility] ONC IMRT TREATMENT [facility] LINAC 3  08/05/2024 [facility]RAD[facility]ATION ONC NEW START [facility] LINAC 4        ==========APPENDIX - ON TREATMENT VISIT NOTES==========   See weekly On Treatment Notes in Epic for details in the Media tab (listed as Progress notes on the On Treatment Visit Dates listed above).

## 2024-08-06 ENCOUNTER — Other Ambulatory Visit: Payer: Self-pay

## 2024-08-06 ENCOUNTER — Ambulatory Visit
Admission: RE | Admit: 2024-08-06 | Discharge: 2024-08-06 | Disposition: A | Discharge status: Home / Self Care | Source: Ambulatory Visit | Attending: Radiation Oncology

## 2024-08-06 ENCOUNTER — Ambulatory Visit
Admission: RE | Admit: 2024-08-06 | Discharge: 2024-08-06 | Disposition: A | Discharge status: Home / Self Care | Source: Ambulatory Visit | Attending: Radiation Oncology | Admitting: Radiation Oncology

## 2024-08-06 DIAGNOSIS — Z510 Encounter for antineoplastic radiation therapy: Secondary | ICD-10-CM | POA: Diagnosis not present

## 2024-08-06 LAB — RAD ONC ARIA SESSION SUMMARY
Course Elapsed Days: 1
Plan Fractions Treated to Date: 2
Plan Prescribed Dose Per Fraction: 1.8 Gy
Plan Total Fractions Prescribed: 25
Plan Total Prescribed Dose: 45 Gy
Reference Point Dosage Given to Date: 3.6 Gy
Reference Point Session Dosage Given: 1.8 Gy
Session Number: 2

## 2024-08-06 NOTE — Radiation Completion Notes (Signed)
 Patient Name: Bryan [name], Bryan [name] MRN: [id number] Date of Birth: [birth year] Referring Physician: TORIBIO LIBRA, M.D. Date of Service: 2024-08-06 Radiation Oncologist: Adina Barge, M.D. [facility] [place]                             RADIATION ONCOLOGY END OF TREATMENT NOTE     Diagnosis: C61 Malignant neoplasm of prostate Staging on 2024-03-06: Malignant neoplasm of prostate (HCC) T=cT1c, N=cN0, M=cM0 Intent: Curative     ==========DELIVERED PLANS==========  First Treatment Date: 2024-08-03 Last Treatment Date: 2024-08-06   Plan Name: Prostate_Pelv Site: Prostate Technique: IMRT Mode: Photon Dose Per Fraction: 1.8 Gy Prescribed Dose (Delivered / Prescribed): 3.6 Gy / 45 Gy Prescribed Fxs (Delivered / Prescribed): 2 / 25   Plan Name: Prostate Seed Implant Site: Prostate Technique: Radioactive Seed Implant I-125 Mode: Brachytherapy Dose Per Fraction: 110 Gy Prescribed Dose (Delivered / Prescribed): 110 Gy / 110 Gy Prescribed Fxs (Delivered / Prescribed): 1 / 1     ==========ON TREATMENT VISIT DATES========== 2024-08-06     ==========UPCOMING VISITS========== 09/08/2024 [facility]RADIATION ONC FINAL TREATMENT [facility] LINAC 3  09/07/2024 [facility]C IMRT TREATMENT [facility] LINAC 3  09/04/2024 [facility] ONC IMRT TREATMENT [facility] LINAC 3  09/03/2024 [facility] ONC IMRT TREATMENT [facility] LINAC 3  09/02/2024 [facility] ONC IMRT TREATMENT [facility] LINAC 3  09/01/2024 [facility] ONC IMRT TREATMENT [facility] LINAC 3  08/31/2024 [facility] ONC IMRT TREATMENT [facility] LINAC 3  08/28/2024 [facility] ONC IMRT TREATMENT [facility] LINAC 3  08/27/2024 [facility] ONC IMRT TREATMENT [facility] LINAC 3  08/26/2024 [facility] ONC IMRT TREATMENT [facility] LINAC 3  08/25/2024 [facility] ONC IMRT TREATMENT [facility] LINAC 3  08/24/2024 [facility] ONC IMRT  TREATMENT [facility] LINAC 3  08/21/2024 [facility] ONC IMRT TREATMENT [facility] LINAC 3  08/20/2024 [facility] ONC IMRT TREATMENT [facility] LINAC 3  08/19/2024 [facility]RAD[facility] ONC IMRT TREATMENT [facility] LINAC 4  08/18/2024 [facility]RADI[facility] ONC IMRT TREATMENT [facility]RADONC [id number]  08/17/2024 [facility]RADIATION ONC IMRT TREATMENT [facility] LINAC 3  08/14/2024 [facility]RADI[facility] ONC IMRT TREATMENT [facility] LINAC 3  08/13/2024 [facility]RADI[facility] ONC IMRT TREATMENT [facility] LINAC 3  08/12/2024 [facility] ONC IMRT TREATMENT [facility] LINAC 3  08/11/2024 [facility]RADIATION ONC IMRT TREATMENT [facility] LINAC 3  08/10/2024 [facility] ONC IMRT TREATMENT [facility] LINAC 3  08/07/2024 [facility] ONC IMRT TREATMENT [facility] LINAC 3  08/06/2024 [facility]-RADIATION ONC WEEKLY UT CHECK LINAC-MANNING  08/06/2024 [facility]RADI[facility] ONC IMRT TREATMENT [facility] LINAC 4        ==========APPENDIX - ON TREATMENT VISIT NOTES==========   See weekly On Treatment Notes in Epic for details in the Media tab (listed as Progress notes on the On Treatment Visit Dates listed above).

## 2024-08-07 ENCOUNTER — Other Ambulatory Visit: Payer: Self-pay

## 2024-08-07 ENCOUNTER — Ambulatory Visit
Admission: RE | Admit: 2024-08-07 | Discharge: 2024-08-07 | Disposition: A | Discharge status: Home / Self Care | Source: Ambulatory Visit | Attending: Radiation Oncology | Admitting: Radiation Oncology

## 2024-08-07 DIAGNOSIS — Z510 Encounter for antineoplastic radiation therapy: Secondary | ICD-10-CM | POA: Diagnosis not present

## 2024-08-07 LAB — RAD ONC ARIA SESSION SUMMARY
Course Elapsed Days: 2
Plan Fractions Treated to Date: 3
Plan Prescribed Dose Per Fraction: 1.8 Gy
Plan Total Fractions Prescribed: 25
Plan Total Prescribed Dose: 45 Gy
Reference Point Dosage Given to Date: 5.4 Gy
Reference Point Session Dosage Given: 1.8 Gy
Session Number: 3

## 2024-08-07 NOTE — Radiation Completion Notes (Addendum)
" °  Radiation Oncology         [phone] ________________________________  Name: Bryan [name] MRN: [id number]  Date: 08/07/2024  DOB: [birth year]  Referring Physician: TORIBIO LIBRA, M.D. Date of Service: 2024-08-07 Radiation Oncologist: Adina Barge, M.D. [facility]     RADIATION ONCOLOGY END OF TREATMENT NOTE     Diagnosis:  78 y.o. gentleman with Stage cT1c, radT3a adenocarcinoma of the prostate with Gleason score of 4+4, and PSA of 6.67.   Intent: Curative     ==========DELIVERED PLANS==========  First Treatment Date: 2024-08-03 Last Treatment Date: 2024-08-07   Plan Name: Prostate_Pelv Site: Prostate Technique: IMRT Mode: Photon Dose Per Fraction: 1.8 Gy Prescribed Dose (Delivered / Prescribed): 5.4 Gy / 45 Gy Prescribed Fxs (Delivered / Prescribed): 3 / 25   Plan Name: Prostate Seed Implant Site: Prostate Technique: Radioactive Seed Implant I-125 Mode: Brachytherapy Dose Per Fraction: 110 Gy Prescribed Dose (Delivered / Prescribed): 110 Gy / 110 Gy Prescribed Fxs (Delivered / Prescribed): 1 / 1     ==========ON TREATMENT VISIT DATES========== 2024-08-06   See weekly On Treatment Notes in Epic for details in the Media tab (listed as Progress notes on the On Treatment Visit Dates listed above).  He tolerated the radiation treatments well with only mild increased LUTS and modest fatigue.  The patient will receive a call in about one month from the radiation oncology department. He will continue follow up with Dr. Elisabeth and his team at the [facility] as well.  ------------------------------------------------   Donnice Barge, MD [facility]  Radiation Oncology Direct Dial: [phone]  Fax: [phone] [email or web address]  Skype  LinkedIn      "

## 2024-08-10 ENCOUNTER — Ambulatory Visit
Admission: RE | Admit: 2024-08-10 | Discharge: 2024-08-10 | Disposition: A | Discharge status: Home / Self Care | Source: Ambulatory Visit | Attending: Radiation Oncology | Admitting: Radiation Oncology

## 2024-08-10 ENCOUNTER — Other Ambulatory Visit: Payer: Self-pay

## 2024-08-10 DIAGNOSIS — Z510 Encounter for antineoplastic radiation therapy: Secondary | ICD-10-CM | POA: Diagnosis not present

## 2024-08-10 LAB — RAD ONC ARIA SESSION SUMMARY
Course Elapsed Days: 5
Plan Fractions Treated to Date: 4
Plan Prescribed Dose Per Fraction: 1.8 Gy
Plan Total Fractions Prescribed: 25
Plan Total Prescribed Dose: 45 Gy
Reference Point Dosage Given to Date: 7.2 Gy
Reference Point Session Dosage Given: 1.8 Gy
Session Number: 4

## 2024-08-11 ENCOUNTER — Other Ambulatory Visit: Payer: Self-pay

## 2024-08-11 ENCOUNTER — Ambulatory Visit
Admission: RE | Admit: 2024-08-11 | Discharge: 2024-08-11 | Disposition: A | Source: Ambulatory Visit | Attending: Radiation Oncology

## 2024-08-11 DIAGNOSIS — Z510 Encounter for antineoplastic radiation therapy: Secondary | ICD-10-CM | POA: Diagnosis not present

## 2024-08-11 LAB — RAD ONC ARIA SESSION SUMMARY
Course Elapsed Days: 6
Plan Fractions Treated to Date: 5
Plan Prescribed Dose Per Fraction: 1.8 Gy
Plan Total Fractions Prescribed: 25
Plan Total Prescribed Dose: 45 Gy
Reference Point Dosage Given to Date: 9 Gy
Reference Point Session Dosage Given: 1.8 Gy
Session Number: 5

## 2024-08-12 ENCOUNTER — Other Ambulatory Visit: Payer: Self-pay

## 2024-08-12 ENCOUNTER — Ambulatory Visit
Admission: RE | Admit: 2024-08-12 | Discharge: 2024-08-12 | Disposition: A | Discharge status: Home / Self Care | Source: Ambulatory Visit | Attending: Radiation Oncology | Admitting: Radiation Oncology

## 2024-08-12 DIAGNOSIS — C61 Malignant neoplasm of prostate: Secondary | ICD-10-CM | POA: Diagnosis present

## 2024-08-12 DIAGNOSIS — Z510 Encounter for antineoplastic radiation therapy: Secondary | ICD-10-CM | POA: Insufficient documentation

## 2024-08-12 LAB — RAD ONC ARIA SESSION SUMMARY
Course Elapsed Days: 7
Plan Fractions Treated to Date: 6
Plan Prescribed Dose Per Fraction: 1.8 Gy
Plan Total Fractions Prescribed: 25
Plan Total Prescribed Dose: 45 Gy
Reference Point Dosage Given to Date: 10.8 Gy
Reference Point Session Dosage Given: 1.8 Gy
Session Number: 6

## 2024-08-13 ENCOUNTER — Other Ambulatory Visit: Payer: Self-pay

## 2024-08-13 ENCOUNTER — Ambulatory Visit
Admission: RE | Admit: 2024-08-13 | Discharge: 2024-08-13 | Disposition: A | Discharge status: Home / Self Care | Source: Ambulatory Visit | Attending: Radiation Oncology | Admitting: Radiation Oncology

## 2024-08-13 DIAGNOSIS — Z510 Encounter for antineoplastic radiation therapy: Secondary | ICD-10-CM | POA: Diagnosis not present

## 2024-08-13 LAB — RAD ONC ARIA SESSION SUMMARY
Course Elapsed Days: 8
Plan Fractions Treated to Date: 7
Plan Prescribed Dose Per Fraction: 1.8 Gy
Plan Total Fractions Prescribed: 25
Plan Total Prescribed Dose: 45 Gy
Reference Point Dosage Given to Date: 12.6 Gy
Reference Point Session Dosage Given: 1.8 Gy
Session Number: 7

## 2024-08-14 ENCOUNTER — Ambulatory Visit
Admission: RE | Admit: 2024-08-14 | Discharge: 2024-08-14 | Disposition: A | Discharge status: Home / Self Care | Source: Ambulatory Visit | Attending: Radiation Oncology | Admitting: Radiation Oncology

## 2024-08-14 ENCOUNTER — Other Ambulatory Visit: Payer: Self-pay

## 2024-08-14 DIAGNOSIS — Z510 Encounter for antineoplastic radiation therapy: Secondary | ICD-10-CM | POA: Diagnosis not present

## 2024-08-14 LAB — RAD ONC ARIA SESSION SUMMARY
Course Elapsed Days: 9
Plan Fractions Treated to Date: 8
Plan Prescribed Dose Per Fraction: 1.8 Gy
Plan Total Fractions Prescribed: 25
Plan Total Prescribed Dose: 45 Gy
Reference Point Dosage Given to Date: 14.4 Gy
Reference Point Session Dosage Given: 1.8 Gy
Session Number: 8

## 2024-08-17 ENCOUNTER — Ambulatory Visit: Admission: RE | Admit: 2024-08-17 | Source: Ambulatory Visit

## 2024-08-18 ENCOUNTER — Ambulatory Visit
Admission: RE | Admit: 2024-08-18 | Discharge: 2024-08-18 | Disposition: A | Source: Ambulatory Visit | Attending: Radiation Oncology

## 2024-08-18 ENCOUNTER — Other Ambulatory Visit: Payer: Self-pay

## 2024-08-18 DIAGNOSIS — Z510 Encounter for antineoplastic radiation therapy: Secondary | ICD-10-CM | POA: Diagnosis not present

## 2024-08-18 LAB — RAD ONC ARIA SESSION SUMMARY
Course Elapsed Days: 13
Plan Fractions Treated to Date: 9
Plan Prescribed Dose Per Fraction: 1.8 Gy
Plan Total Fractions Prescribed: 25
Plan Total Prescribed Dose: 45 Gy
Reference Point Dosage Given to Date: 16.2 Gy
Reference Point Session Dosage Given: 1.8 Gy
Session Number: 9

## 2024-08-19 ENCOUNTER — Ambulatory Visit: Admission: RE | Admit: 2024-08-19 | Discharge: 2024-08-19 | Attending: Radiation Oncology

## 2024-08-19 ENCOUNTER — Other Ambulatory Visit: Payer: Self-pay

## 2024-08-19 DIAGNOSIS — Z510 Encounter for antineoplastic radiation therapy: Secondary | ICD-10-CM | POA: Diagnosis not present

## 2024-08-19 LAB — RAD ONC ARIA SESSION SUMMARY
Course Elapsed Days: 14
Plan Fractions Treated to Date: 10
Plan Prescribed Dose Per Fraction: 1.8 Gy
Plan Total Fractions Prescribed: 25
Plan Total Prescribed Dose: 45 Gy
Reference Point Dosage Given to Date: 18 Gy
Reference Point Session Dosage Given: 1.8 Gy
Session Number: 10

## 2024-08-20 ENCOUNTER — Ambulatory Visit
Admission: RE | Admit: 2024-08-20 | Discharge: 2024-08-20 | Disposition: A | Discharge status: Home / Self Care | Source: Ambulatory Visit | Attending: Radiation Oncology | Admitting: Radiation Oncology

## 2024-08-20 ENCOUNTER — Other Ambulatory Visit: Payer: Self-pay

## 2024-08-20 DIAGNOSIS — Z510 Encounter for antineoplastic radiation therapy: Secondary | ICD-10-CM | POA: Diagnosis not present

## 2024-08-20 LAB — RAD ONC ARIA SESSION SUMMARY
Course Elapsed Days: 15
Plan Fractions Treated to Date: 11
Plan Prescribed Dose Per Fraction: 1.8 Gy
Plan Total Fractions Prescribed: 25
Plan Total Prescribed Dose: 45 Gy
Reference Point Dosage Given to Date: 19.8 Gy
Reference Point Session Dosage Given: 1.8 Gy
Session Number: 11

## 2024-08-21 ENCOUNTER — Ambulatory Visit
Admission: RE | Admit: 2024-08-21 | Discharge: 2024-08-21 | Disposition: A | Discharge status: Home / Self Care | Source: Ambulatory Visit | Attending: Radiation Oncology | Admitting: Radiation Oncology

## 2024-08-21 ENCOUNTER — Other Ambulatory Visit: Payer: Self-pay

## 2024-08-21 DIAGNOSIS — Z510 Encounter for antineoplastic radiation therapy: Secondary | ICD-10-CM | POA: Diagnosis not present

## 2024-08-21 LAB — RAD ONC ARIA SESSION SUMMARY
Course Elapsed Days: 16
Plan Fractions Treated to Date: 12
Plan Prescribed Dose Per Fraction: 1.8 Gy
Plan Total Fractions Prescribed: 25
Plan Total Prescribed Dose: 45 Gy
Reference Point Dosage Given to Date: 21.6 Gy
Reference Point Session Dosage Given: 1.8 Gy
Session Number: 12

## 2024-08-24 ENCOUNTER — Ambulatory Visit
Admission: RE | Admit: 2024-08-24 | Discharge: 2024-08-24 | Disposition: A | Discharge status: Home / Self Care | Source: Ambulatory Visit | Attending: Radiation Oncology | Admitting: Radiation Oncology

## 2024-08-24 ENCOUNTER — Other Ambulatory Visit: Payer: Self-pay

## 2024-08-24 DIAGNOSIS — Z510 Encounter for antineoplastic radiation therapy: Secondary | ICD-10-CM | POA: Diagnosis not present

## 2024-08-24 LAB — RAD ONC ARIA SESSION SUMMARY
Course Elapsed Days: 19
Plan Fractions Treated to Date: 13
Plan Prescribed Dose Per Fraction: 1.8 Gy
Plan Total Fractions Prescribed: 25
Plan Total Prescribed Dose: 45 Gy
Reference Point Dosage Given to Date: 23.4 Gy
Reference Point Session Dosage Given: 1.8 Gy
Session Number: 13

## 2024-08-25 ENCOUNTER — Telehealth: Payer: Self-pay

## 2024-08-25 ENCOUNTER — Ambulatory Visit
Admission: RE | Admit: 2024-08-25 | Discharge: 2024-08-25 | Disposition: A | Discharge status: Home / Self Care | Source: Ambulatory Visit | Attending: Radiation Oncology | Admitting: Radiation Oncology

## 2024-08-25 ENCOUNTER — Other Ambulatory Visit: Payer: Self-pay

## 2024-08-25 DIAGNOSIS — Z510 Encounter for antineoplastic radiation therapy: Secondary | ICD-10-CM | POA: Diagnosis not present

## 2024-08-25 LAB — RAD ONC ARIA SESSION SUMMARY
Course Elapsed Days: 20
Plan Fractions Treated to Date: 14
Plan Prescribed Dose Per Fraction: 1.8 Gy
Plan Total Fractions Prescribed: 25
Plan Total Prescribed Dose: 45 Gy
Reference Point Dosage Given to Date: 25.2 Gy
Reference Point Session Dosage Given: 1.8 Gy
Session Number: 14

## 2024-08-25 NOTE — Telephone Encounter (Signed)
 RN spoke with Westberg when he came in for treatment this afternoon he had some complaints of urinary frequency, nocturia x q1h, and dysuria.  Reports easier to urinate when in seated position verus standing upright.  Mr. Thorner reports taking Tamsulosin  0.4 mg twice a day was advised to take 2 tablets at night with AZO maximum strength for pain and increase water intake.  RN advised Mr. Falzone to let us  know if that doesn't work.  Mr. Pardo is scheduled for undertreat visit on Friday we will follow up at that time unless he needs us  before.

## 2024-08-26 ENCOUNTER — Other Ambulatory Visit: Payer: Self-pay

## 2024-08-26 ENCOUNTER — Ambulatory Visit
Admission: RE | Admit: 2024-08-26 | Discharge: 2024-08-26 | Disposition: A | Discharge status: Home / Self Care | Source: Ambulatory Visit | Attending: Radiation Oncology | Admitting: Radiation Oncology

## 2024-08-26 DIAGNOSIS — Z510 Encounter for antineoplastic radiation therapy: Secondary | ICD-10-CM | POA: Diagnosis not present

## 2024-08-26 LAB — RAD ONC ARIA SESSION SUMMARY
Course Elapsed Days: 21
Plan Fractions Treated to Date: 15
Plan Prescribed Dose Per Fraction: 1.8 Gy
Plan Total Fractions Prescribed: 25
Plan Total Prescribed Dose: 45 Gy
Reference Point Dosage Given to Date: 27 Gy
Reference Point Session Dosage Given: 1.8 Gy
Session Number: 15

## 2024-08-27 ENCOUNTER — Ambulatory Visit
Admission: RE | Admit: 2024-08-27 | Discharge: 2024-08-27 | Disposition: A | Discharge status: Home / Self Care | Source: Ambulatory Visit | Attending: Radiation Oncology | Admitting: Radiation Oncology

## 2024-08-27 ENCOUNTER — Other Ambulatory Visit: Payer: Self-pay

## 2024-08-27 DIAGNOSIS — Z510 Encounter for antineoplastic radiation therapy: Secondary | ICD-10-CM | POA: Diagnosis not present

## 2024-08-27 LAB — RAD ONC ARIA SESSION SUMMARY
Course Elapsed Days: 22
Plan Fractions Treated to Date: 16
Plan Prescribed Dose Per Fraction: 1.8 Gy
Plan Total Fractions Prescribed: 25
Plan Total Prescribed Dose: 45 Gy
Reference Point Dosage Given to Date: 28.8 Gy
Reference Point Session Dosage Given: 1.8 Gy
Session Number: 16

## 2024-08-28 ENCOUNTER — Ambulatory Visit
Admission: RE | Admit: 2024-08-28 | Discharge: 2024-08-28 | Disposition: A | Source: Ambulatory Visit | Attending: Radiation Oncology

## 2024-08-28 ENCOUNTER — Ambulatory Visit

## 2024-08-28 ENCOUNTER — Other Ambulatory Visit: Payer: Self-pay

## 2024-08-28 DIAGNOSIS — Z510 Encounter for antineoplastic radiation therapy: Secondary | ICD-10-CM | POA: Diagnosis not present

## 2024-08-28 LAB — RAD ONC ARIA SESSION SUMMARY
Course Elapsed Days: 23
Plan Fractions Treated to Date: 17
Plan Prescribed Dose Per Fraction: 1.8 Gy
Plan Total Fractions Prescribed: 25
Plan Total Prescribed Dose: 45 Gy
Reference Point Dosage Given to Date: 30.6 Gy
Reference Point Session Dosage Given: 1.8 Gy
Session Number: 17

## 2024-08-31 ENCOUNTER — Ambulatory Visit
Admission: RE | Admit: 2024-08-31 | Discharge: 2024-08-31 | Disposition: A | Discharge status: Home / Self Care | Source: Ambulatory Visit | Attending: Radiation Oncology | Admitting: Radiation Oncology

## 2024-08-31 ENCOUNTER — Other Ambulatory Visit: Payer: Self-pay

## 2024-08-31 ENCOUNTER — Other Ambulatory Visit: Payer: Self-pay | Admitting: Radiation Oncology

## 2024-08-31 DIAGNOSIS — Z510 Encounter for antineoplastic radiation therapy: Secondary | ICD-10-CM | POA: Diagnosis not present

## 2024-08-31 LAB — RAD ONC ARIA SESSION SUMMARY
Course Elapsed Days: 26
Plan Fractions Treated to Date: 18
Plan Prescribed Dose Per Fraction: 1.8 Gy
Plan Total Fractions Prescribed: 25
Plan Total Prescribed Dose: 45 Gy
Reference Point Dosage Given to Date: 32.4 Gy
Reference Point Session Dosage Given: 1.8 Gy
Session Number: 18

## 2024-08-31 MED ORDER — TRAMADOL HCL 50 MG PO TABS: 50.0000 mg | ORAL_TABLET | Freq: Four times a day (QID) | ORAL | 0 refills | Status: AC | PRN

## 2024-09-01 ENCOUNTER — Other Ambulatory Visit: Payer: Self-pay

## 2024-09-01 ENCOUNTER — Ambulatory Visit
Admission: RE | Admit: 2024-09-01 | Discharge: 2024-09-01 | Disposition: A | Discharge status: Home / Self Care | Source: Ambulatory Visit | Attending: Radiation Oncology | Admitting: Radiation Oncology

## 2024-09-01 DIAGNOSIS — Z510 Encounter for antineoplastic radiation therapy: Secondary | ICD-10-CM | POA: Diagnosis not present

## 2024-09-01 LAB — RAD ONC ARIA SESSION SUMMARY
Course Elapsed Days: 27
Plan Fractions Treated to Date: 19
Plan Prescribed Dose Per Fraction: 1.8 Gy
Plan Total Fractions Prescribed: 25
Plan Total Prescribed Dose: 45 Gy
Reference Point Dosage Given to Date: 34.2 Gy
Reference Point Session Dosage Given: 1.8 Gy
Session Number: 19

## 2024-09-02 ENCOUNTER — Other Ambulatory Visit: Payer: Self-pay

## 2024-09-02 ENCOUNTER — Ambulatory Visit
Admission: RE | Admit: 2024-09-02 | Discharge: 2024-09-02 | Disposition: A | Discharge status: Home / Self Care | Source: Ambulatory Visit | Attending: Radiation Oncology | Admitting: Radiation Oncology

## 2024-09-02 DIAGNOSIS — Z510 Encounter for antineoplastic radiation therapy: Secondary | ICD-10-CM | POA: Diagnosis not present

## 2024-09-02 LAB — RAD ONC ARIA SESSION SUMMARY
Course Elapsed Days: 28
Plan Fractions Treated to Date: 20
Plan Prescribed Dose Per Fraction: 1.8 Gy
Plan Total Fractions Prescribed: 25
Plan Total Prescribed Dose: 45 Gy
Reference Point Dosage Given to Date: 36 Gy
Reference Point Session Dosage Given: 1.8 Gy
Session Number: 20

## 2024-09-03 ENCOUNTER — Other Ambulatory Visit: Payer: Self-pay

## 2024-09-03 ENCOUNTER — Ambulatory Visit
Admission: RE | Admit: 2024-09-03 | Discharge: 2024-09-03 | Disposition: A | Source: Ambulatory Visit | Attending: Radiation Oncology

## 2024-09-03 DIAGNOSIS — Z510 Encounter for antineoplastic radiation therapy: Secondary | ICD-10-CM | POA: Diagnosis not present

## 2024-09-03 LAB — RAD ONC ARIA SESSION SUMMARY
Course Elapsed Days: 29
Plan Fractions Treated to Date: 21
Plan Prescribed Dose Per Fraction: 1.8 Gy
Plan Total Fractions Prescribed: 25
Plan Total Prescribed Dose: 45 Gy
Reference Point Dosage Given to Date: 37.8 Gy
Reference Point Session Dosage Given: 1.8 Gy
Session Number: 21

## 2024-09-04 ENCOUNTER — Other Ambulatory Visit: Payer: Self-pay

## 2024-09-04 ENCOUNTER — Ambulatory Visit
Admission: RE | Admit: 2024-09-04 | Discharge: 2024-09-04 | Disposition: A | Discharge status: Home / Self Care | Source: Ambulatory Visit | Attending: Radiation Oncology | Admitting: Radiation Oncology

## 2024-09-04 DIAGNOSIS — Z510 Encounter for antineoplastic radiation therapy: Secondary | ICD-10-CM | POA: Diagnosis not present

## 2024-09-04 LAB — RAD ONC ARIA SESSION SUMMARY
Course Elapsed Days: 30
Plan Fractions Treated to Date: 22
Plan Prescribed Dose Per Fraction: 1.8 Gy
Plan Total Fractions Prescribed: 25
Plan Total Prescribed Dose: 45 Gy
Reference Point Dosage Given to Date: 39.6 Gy
Reference Point Session Dosage Given: 1.8 Gy
Session Number: 22

## 2024-09-07 ENCOUNTER — Ambulatory Visit

## 2024-09-07 ENCOUNTER — Ambulatory Visit
Admission: RE | Admit: 2024-09-07 | Discharge: 2024-09-07 | Disposition: A | Source: Ambulatory Visit | Attending: Radiation Oncology

## 2024-09-07 ENCOUNTER — Other Ambulatory Visit: Payer: Self-pay

## 2024-09-07 DIAGNOSIS — Z510 Encounter for antineoplastic radiation therapy: Secondary | ICD-10-CM | POA: Diagnosis not present

## 2024-09-07 LAB — RAD ONC ARIA SESSION SUMMARY
Course Elapsed Days: 33
Plan Fractions Treated to Date: 23
Plan Prescribed Dose Per Fraction: 1.8 Gy
Plan Total Fractions Prescribed: 25
Plan Total Prescribed Dose: 45 Gy
Reference Point Dosage Given to Date: 41.4 Gy
Reference Point Session Dosage Given: 1.8 Gy
Session Number: 23

## 2024-09-08 ENCOUNTER — Ambulatory Visit
Admission: RE | Admit: 2024-09-08 | Discharge: 2024-09-08 | Disposition: A | Source: Ambulatory Visit | Attending: Radiation Oncology

## 2024-09-08 ENCOUNTER — Ambulatory Visit

## 2024-09-08 ENCOUNTER — Other Ambulatory Visit: Payer: Self-pay

## 2024-09-08 DIAGNOSIS — Z510 Encounter for antineoplastic radiation therapy: Secondary | ICD-10-CM | POA: Diagnosis not present

## 2024-09-08 LAB — RAD ONC ARIA SESSION SUMMARY
Course Elapsed Days: 34
Plan Fractions Treated to Date: 24
Plan Prescribed Dose Per Fraction: 1.8 Gy
Plan Total Fractions Prescribed: 25
Plan Total Prescribed Dose: 45 Gy
Reference Point Dosage Given to Date: 43.2 Gy
Reference Point Session Dosage Given: 1.8 Gy
Session Number: 24

## 2024-09-09 ENCOUNTER — Ambulatory Visit

## 2024-09-10 ENCOUNTER — Ambulatory Visit: Admission: RE | Admit: 2024-09-10 | Discharge: 2024-09-10 | Attending: Radiation Oncology

## 2024-09-10 ENCOUNTER — Other Ambulatory Visit: Payer: Self-pay

## 2024-09-10 DIAGNOSIS — Z510 Encounter for antineoplastic radiation therapy: Secondary | ICD-10-CM | POA: Diagnosis not present

## 2024-09-10 LAB — RAD ONC ARIA SESSION SUMMARY
Course Elapsed Days: 36
Plan Fractions Treated to Date: 25
Plan Prescribed Dose Per Fraction: 1.8 Gy
Plan Total Fractions Prescribed: 25
Plan Total Prescribed Dose: 45 Gy
Reference Point Dosage Given to Date: 45 Gy
Reference Point Session Dosage Given: 1.8 Gy
Session Number: 25

## 2024-09-15 ENCOUNTER — Ambulatory Visit: Attending: Radiation Oncology

## 2024-09-15 ENCOUNTER — Other Ambulatory Visit: Payer: Self-pay

## 2024-09-15 ENCOUNTER — Other Ambulatory Visit: Payer: Self-pay | Admitting: Urology

## 2024-09-15 ENCOUNTER — Ambulatory Visit

## 2024-09-15 DIAGNOSIS — Z510 Encounter for antineoplastic radiation therapy: Secondary | ICD-10-CM | POA: Insufficient documentation

## 2024-09-15 DIAGNOSIS — C61 Malignant neoplasm of prostate: Secondary | ICD-10-CM | POA: Diagnosis present

## 2024-09-15 DIAGNOSIS — R300 Dysuria: Secondary | ICD-10-CM

## 2024-09-15 LAB — URINALYSIS, COMPLETE (UACMP) WITH MICROSCOPIC
Bilirubin Urine: NEGATIVE
Glucose, UA: NEGATIVE mg/dL
Ketones, ur: NEGATIVE mg/dL
Nitrite: POSITIVE — AB
Protein, ur: 100 mg/dL — AB
Specific Gravity, Urine: 1.011 (ref 1.005–1.030)
WBC, UA: >50 WBC/hpf (ref 0–5)
pH: 5 (ref 5.0–8.0)

## 2024-09-15 MED ORDER — PHENAZOPYRIDINE HCL 200 MG PO TABS: 200.0000 mg | ORAL_TABLET | Freq: Three times a day (TID) | ORAL | 1 refills | Status: AC | PRN

## 2024-09-15 MED ORDER — SULFAMETHOXAZOLE-TRIMETHOPRIM 800-160 MG PO TABS: 1.0000 | ORAL_TABLET | Freq: Two times a day (BID) | ORAL | 0 refills | Status: AC

## 2024-09-16 ENCOUNTER — Telehealth: Payer: Self-pay

## 2024-09-16 NOTE — Telephone Encounter (Addendum)
 RN called Bryan [name] per Sabra Rusk, PA-C, to inform him that his UA results showed UTI and prescriptions were sent to his [facility] pharmacy.  RN advised Bryan [name] if he was still taking the OTC-AZO to stop and to start taking the pyridium and antibiotics as directed, and to increase his water intake.  RN also advised Bryan [name] to continue taking his Tamsulosin .  Bryan [name] was told that we will be looking for urine culture results just to make sure the antibiotics will work for him and that we will inform him once we have them.  Bryan [name] was appreciative and verbalized understanding will pick up prescriptions this morning.

## 2024-09-18 LAB — URINE CULTURE: Culture: >=100000 — AB

## 2024-10-02 NOTE — Progress Notes (Signed)
  Radiation Oncology         [phone] ________________________________  Name: Bryan [name] MRN: [id number]  Date of Service: 10/13/2024  DOB: 18-Feb-1946  Post Treatment Telephone Note  Diagnosis:  78 y.o. gentleman with Stage cT1c, radT3a adenocarcinoma of the prostate with Gleason score of 4+4, and PSA of 6.67   First Treatment Date: 2024-08-03 Last Treatment Date: 2024-08-07   Plan Name: Prostate_Pelv Site: Prostate Technique: IMRT Mode: Photon Dose Per Fraction: 1.8 Gy Prescribed Dose (Delivered / Prescribed): 5.4 Gy / 45 Gy Prescribed Fxs (Delivered / Prescribed): 3 / 25   Plan Name: Prostate Seed Implant Site: Prostate Technique: Radioactive Seed Implant I-125 Mode: Brachytherapy Dose Per Fraction: 110 Gy Prescribed Dose (Delivered / Prescribed): 110 Gy / 110 Gy Prescribed Fxs (Delivered / Prescribed): 1 / 1    Pre Treatment IPSS Score: 11 (as documented in the provider consult note)  The patient was available for call today.   Symptoms of fatigue have improved since completing therapy.  Symptoms of bladder changes have improved since completing therapy. Current symptoms include frequency and nocturia, and medications for bladder symptoms include Flomax .  Symptoms of bowel changes have improved since completing therapy. Current symptoms include some occasional diarrhea, and medications for bowel symptoms include N/A.   Post Treatment IPSS Score: IPSS Questionnaire (AUA-7): Over the past month.   1)  How often have you had a sensation of not emptying your bladder completely after you finish urinating?  4 - More than half the time  2)  How often have you had to urinate again less than two hours after you finished urinating? 3 - About half the time  3)  How often have you found you stopped and started again several times when you urinated?  3 - About half the time  4) How difficult have you found it to postpone urination?  2 - Less than half the time  5) How  often have you had a weak urinary stream?  5 - Almost always  6) How often have you had to push or strain to begin urination?  3 - About half the time  7) How many times did you most typically get up to urinate from the time you went to bed until the time you got up in the morning?  4 - 4 times  Total score:  24. Which indicates severe symptoms  0-7 mildly symptomatic   8-19 moderately symptomatic   20-35 severely symptomatic   Patient  does not currently have any scheduled follow up with his urologist to his knowledge so he was advised to call [facility] to schedule his post-treatment follow up with Dr. Valli Shank for ongoing surveillance. He was counseled that PSA levels will be drawn in the urology office, and was reassured that additional time is expected to improve bowel and bladder symptoms. He was encouraged to call back with concerns or questions regarding radiation.

## 2024-10-13 ENCOUNTER — Ambulatory Visit
Admission: RE | Admit: 2024-10-13 | Discharge: 2024-10-13 | Disposition: A | Source: Ambulatory Visit | Attending: Radiation Oncology

## 2024-10-13 DIAGNOSIS — C61 Malignant neoplasm of prostate: Secondary | ICD-10-CM

## 2024-10-30 ENCOUNTER — Other Ambulatory Visit: Payer: Self-pay | Admitting: Urology

## 2024-10-30 DIAGNOSIS — C61 Malignant neoplasm of prostate: Secondary | ICD-10-CM

## 2024-11-03 ENCOUNTER — Encounter: Payer: Self-pay | Admitting: *Deleted

## 2024-11-10 ENCOUNTER — Telehealth: Payer: Self-pay | Admitting: Radiation Oncology

## 2024-11-10 NOTE — Telephone Encounter (Signed)
 12/30 Patient called for copy of his medical records.  Patient is aware we need signed consent before release his record. Patient will stop by to pick up record on today.

## 2024-11-30 ENCOUNTER — Encounter: Payer: Self-pay | Admitting: *Deleted

## 2024-12-15 ENCOUNTER — Encounter: Payer: Self-pay | Admitting: *Deleted

## 2024-12-15 NOTE — Progress Notes (Signed)
 SCP completed and mailed to pt.

## 2024-12-22 ENCOUNTER — Inpatient Hospital Stay: Admitting: *Deleted

## 2025-01-22 ENCOUNTER — Inpatient Hospital Stay

## 2025-01-22 ENCOUNTER — Inpatient Hospital Stay: Admitting: Hematology
